# Patient Record
Sex: Female | Born: 1935 | Race: Black or African American | Hispanic: No | Smoking: Former smoker
Health system: Southern US, Community
[De-identification: ages and names within clinical notes are randomized; demographics above are authoritative.]

## PROBLEM LIST (undated history)

## (undated) DIAGNOSIS — N390 Urinary tract infection, site not specified: Secondary | ICD-10-CM

## (undated) DIAGNOSIS — I82403 Acute embolism and thrombosis of unspecified deep veins of lower extremity, bilateral: Secondary | ICD-10-CM

## (undated) DIAGNOSIS — F32A Depression, unspecified: Secondary | ICD-10-CM

## (undated) DIAGNOSIS — E785 Hyperlipidemia, unspecified: Secondary | ICD-10-CM

## (undated) DIAGNOSIS — Z8489 Family history of other specified conditions: Secondary | ICD-10-CM

## (undated) DIAGNOSIS — K219 Gastro-esophageal reflux disease without esophagitis: Secondary | ICD-10-CM

## (undated) DIAGNOSIS — Z8619 Personal history of other infectious and parasitic diseases: Secondary | ICD-10-CM

## (undated) DIAGNOSIS — Z5189 Encounter for other specified aftercare: Secondary | ICD-10-CM

## (undated) DIAGNOSIS — IMO0001 Reserved for inherently not codable concepts without codable children: Secondary | ICD-10-CM

## (undated) DIAGNOSIS — R011 Cardiac murmur, unspecified: Secondary | ICD-10-CM

## (undated) DIAGNOSIS — F329 Major depressive disorder, single episode, unspecified: Secondary | ICD-10-CM

## (undated) DIAGNOSIS — I429 Cardiomyopathy, unspecified: Secondary | ICD-10-CM

## (undated) DIAGNOSIS — M1990 Unspecified osteoarthritis, unspecified site: Secondary | ICD-10-CM

## (undated) DIAGNOSIS — F419 Anxiety disorder, unspecified: Secondary | ICD-10-CM

## (undated) DIAGNOSIS — I10 Essential (primary) hypertension: Secondary | ICD-10-CM

## (undated) DIAGNOSIS — D649 Anemia, unspecified: Secondary | ICD-10-CM

## (undated) DIAGNOSIS — Z923 Personal history of irradiation: Secondary | ICD-10-CM

## (undated) DIAGNOSIS — I809 Phlebitis and thrombophlebitis of unspecified site: Secondary | ICD-10-CM

## (undated) HISTORY — DX: Hyperlipidemia, unspecified: E78.5

## (undated) HISTORY — DX: Personal history of irradiation: Z92.3

## (undated) HISTORY — DX: Reserved for inherently not codable concepts without codable children: IMO0001

## (undated) HISTORY — DX: Unspecified osteoarthritis, unspecified site: M19.90

## (undated) HISTORY — DX: Essential (primary) hypertension: I10

## (undated) HISTORY — PX: EYE SURGERY: SHX253

## (undated) HISTORY — PX: COLONOSCOPY: SHX174

## (undated) HISTORY — PX: ABDOMINAL HYSTERECTOMY: SHX81

## (undated) HISTORY — DX: Encounter for other specified aftercare: Z51.89

## (undated) HISTORY — DX: Personal history of other infectious and parasitic diseases: Z86.19

## (undated) HISTORY — DX: Phlebitis and thrombophlebitis of unspecified site: I80.9

## (undated) HISTORY — DX: Depression, unspecified: F32.A

## (undated) HISTORY — DX: Major depressive disorder, single episode, unspecified: F32.9

## (undated) HISTORY — PX: ANKLE FRACTURE SURGERY: SHX122

---

## 1998-10-06 ENCOUNTER — Encounter: Payer: Self-pay | Admitting: *Deleted

## 1998-10-06 ENCOUNTER — Observation Stay (HOSPITAL_COMMUNITY): Admission: EM | Admit: 1998-10-06 | Discharge: 1998-10-07 | Payer: Self-pay | Admitting: Emergency Medicine

## 1998-10-06 ENCOUNTER — Encounter: Payer: Self-pay | Admitting: Emergency Medicine

## 2000-05-25 ENCOUNTER — Encounter: Payer: Self-pay | Admitting: Family Medicine

## 2000-05-25 ENCOUNTER — Encounter: Admission: RE | Admit: 2000-05-25 | Discharge: 2000-05-25 | Payer: Self-pay | Admitting: Family Medicine

## 2001-05-26 ENCOUNTER — Encounter: Admission: RE | Admit: 2001-05-26 | Discharge: 2001-05-26 | Payer: Self-pay | Admitting: Family Medicine

## 2001-05-26 ENCOUNTER — Encounter: Payer: Self-pay | Admitting: Family Medicine

## 2003-10-19 ENCOUNTER — Other Ambulatory Visit: Admission: RE | Admit: 2003-10-19 | Discharge: 2003-10-19 | Payer: Self-pay | Admitting: Family Medicine

## 2003-11-08 ENCOUNTER — Encounter: Admission: RE | Admit: 2003-11-08 | Discharge: 2003-11-08 | Payer: Self-pay | Admitting: Family Medicine

## 2003-12-04 ENCOUNTER — Ambulatory Visit (HOSPITAL_COMMUNITY): Admission: RE | Admit: 2003-12-04 | Discharge: 2003-12-04 | Payer: Self-pay | Admitting: Gastroenterology

## 2004-11-08 ENCOUNTER — Encounter: Admission: RE | Admit: 2004-11-08 | Discharge: 2004-11-08 | Payer: Self-pay | Admitting: Family Medicine

## 2004-12-03 ENCOUNTER — Other Ambulatory Visit: Admission: RE | Admit: 2004-12-03 | Discharge: 2004-12-03 | Payer: Self-pay | Admitting: Family Medicine

## 2005-11-11 ENCOUNTER — Encounter: Admission: RE | Admit: 2005-11-11 | Discharge: 2005-11-11 | Payer: Self-pay | Admitting: Family Medicine

## 2005-12-30 ENCOUNTER — Other Ambulatory Visit: Admission: RE | Admit: 2005-12-30 | Discharge: 2005-12-30 | Payer: Self-pay | Admitting: Family Medicine

## 2006-11-17 ENCOUNTER — Encounter: Admission: RE | Admit: 2006-11-17 | Discharge: 2006-11-17 | Payer: Self-pay | Admitting: Family Medicine

## 2007-01-19 ENCOUNTER — Other Ambulatory Visit: Admission: RE | Admit: 2007-01-19 | Discharge: 2007-01-19 | Payer: Self-pay | Admitting: Family Medicine

## 2007-02-03 ENCOUNTER — Encounter: Admission: RE | Admit: 2007-02-03 | Discharge: 2007-02-03 | Payer: Self-pay | Admitting: Family Medicine

## 2007-06-02 ENCOUNTER — Ambulatory Visit (HOSPITAL_COMMUNITY): Admission: RE | Admit: 2007-06-02 | Discharge: 2007-06-02 | Payer: Self-pay | Admitting: Family Medicine

## 2007-11-18 ENCOUNTER — Encounter: Admission: RE | Admit: 2007-11-18 | Discharge: 2007-11-18 | Payer: Self-pay | Admitting: Family Medicine

## 2008-04-28 ENCOUNTER — Emergency Department (HOSPITAL_COMMUNITY): Admission: EM | Admit: 2008-04-28 | Discharge: 2008-04-29 | Payer: Self-pay | Admitting: Emergency Medicine

## 2008-04-28 ENCOUNTER — Encounter: Admission: RE | Admit: 2008-04-28 | Discharge: 2008-04-28 | Payer: Self-pay | Admitting: Nephrology

## 2008-05-04 ENCOUNTER — Ambulatory Visit: Payer: Self-pay | Admitting: Internal Medicine

## 2008-05-06 ENCOUNTER — Encounter: Admission: RE | Admit: 2008-05-06 | Discharge: 2008-05-06 | Payer: Self-pay | Admitting: Nephrology

## 2008-05-10 LAB — CBC WITH DIFFERENTIAL/PLATELET
BASO%: 0.4 % (ref 0.0–2.0)
EOS%: 4.4 % (ref 0.0–7.0)
HCT: 29.2 % — ABNORMAL LOW (ref 34.8–46.6)
LYMPH%: 19.3 % (ref 14.0–48.0)
MCH: 32.9 pg (ref 26.0–34.0)
MCHC: 35 g/dL (ref 32.0–36.0)
NEUT%: 67.9 % (ref 39.6–76.8)
lymph#: 1 10*3/uL (ref 0.9–3.3)

## 2008-05-11 LAB — COMPREHENSIVE METABOLIC PANEL

## 2008-05-12 ENCOUNTER — Encounter: Payer: Self-pay | Admitting: Family Medicine

## 2008-05-12 ENCOUNTER — Encounter: Payer: Self-pay | Admitting: Internal Medicine

## 2008-05-12 ENCOUNTER — Other Ambulatory Visit: Admission: RE | Admit: 2008-05-12 | Discharge: 2008-05-12 | Payer: Self-pay | Admitting: Internal Medicine

## 2008-05-15 ENCOUNTER — Ambulatory Visit (HOSPITAL_COMMUNITY): Admission: RE | Admit: 2008-05-15 | Discharge: 2008-05-15 | Payer: Self-pay | Admitting: Internal Medicine

## 2008-05-15 LAB — IMMUNOFIXATION ELECTROPHORESIS
IgG (Immunoglobin G), Serum: 526 mg/dL — ABNORMAL LOW (ref 694–1618)
Total Protein, Serum Electrophoresis: 7.6 g/dL (ref 6.0–8.3)

## 2008-05-15 LAB — IRON AND TIBC
%SAT: 13 % — ABNORMAL LOW (ref 20–55)
TIBC: 312 ug/dL (ref 250–470)

## 2008-05-15 LAB — KAPPA/LAMBDA LIGHT CHAINS: Kappa:Lambda Ratio: 0 — ABNORMAL LOW (ref 0.26–1.65)

## 2008-05-18 LAB — BASIC METABOLIC PANEL
BUN: 53 mg/dL — ABNORMAL HIGH (ref 6–23)
Chloride: 103 mEq/L (ref 96–112)
Potassium: 4 mEq/L (ref 3.5–5.3)
Sodium: 139 mEq/L (ref 135–145)

## 2008-05-18 LAB — CBC WITH DIFFERENTIAL/PLATELET
BASO%: 0.4 % (ref 0.0–2.0)
Basophils Absolute: 0 10*3/uL (ref 0.0–0.1)
HCT: 29.4 % — ABNORMAL LOW (ref 34.8–46.6)
HGB: 10.1 g/dL — ABNORMAL LOW (ref 11.6–15.9)
MCHC: 34.5 g/dL (ref 32.0–36.0)
MONO#: 0.5 10*3/uL (ref 0.1–0.9)
NEUT%: 59.2 % (ref 39.6–76.8)
RDW: 14.4 % (ref 11.3–14.5)
WBC: 5.6 10*3/uL (ref 3.9–10.0)
lymph#: 1.4 10*3/uL (ref 0.9–3.3)

## 2008-05-19 DIAGNOSIS — M1990 Unspecified osteoarthritis, unspecified site: Secondary | ICD-10-CM | POA: Insufficient documentation

## 2008-05-19 DIAGNOSIS — E785 Hyperlipidemia, unspecified: Secondary | ICD-10-CM

## 2008-05-19 DIAGNOSIS — I10 Essential (primary) hypertension: Secondary | ICD-10-CM

## 2008-05-23 ENCOUNTER — Ambulatory Visit: Admission: RE | Admit: 2008-05-23 | Discharge: 2008-06-27 | Payer: Self-pay | Admitting: Radiation Oncology

## 2008-05-24 ENCOUNTER — Encounter: Payer: Self-pay | Admitting: Family Medicine

## 2008-05-31 ENCOUNTER — Encounter (HOSPITAL_COMMUNITY): Admission: RE | Admit: 2008-05-31 | Discharge: 2008-08-29 | Payer: Self-pay | Admitting: Internal Medicine

## 2008-05-31 LAB — CBC WITH DIFFERENTIAL/PLATELET
BASO%: 0 % (ref 0.0–2.0)
Eosinophils Absolute: 0.3 10*3/uL (ref 0.0–0.5)
LYMPH%: 5.3 % — ABNORMAL LOW (ref 14.0–48.0)
MCHC: 34.8 g/dL (ref 32.0–36.0)
MCV: 93.5 fL (ref 81.0–101.0)
MONO%: 3.7 % (ref 0.0–13.0)
NEUT#: 6 10*3/uL (ref 1.5–6.5)
Platelets: 257 10*3/uL (ref 145–400)
RBC: 2.66 10*6/uL — ABNORMAL LOW (ref 3.70–5.32)
RDW: 15.3 % — ABNORMAL HIGH (ref 11.3–14.5)
WBC: 7 10*3/uL (ref 3.9–10.0)

## 2008-05-31 LAB — COMPREHENSIVE METABOLIC PANEL
ALT: 18 U/L (ref 0–35)
AST: 32 U/L (ref 0–37)
Albumin: 3.9 g/dL (ref 3.5–5.2)
Alkaline Phosphatase: 75 U/L (ref 39–117)
Potassium: 4.9 mEq/L (ref 3.5–5.3)
Sodium: 129 mEq/L — ABNORMAL LOW (ref 135–145)
Total Bilirubin: 0.4 mg/dL (ref 0.3–1.2)
Total Protein: 6.4 g/dL (ref 6.0–8.3)

## 2008-05-31 LAB — TYPE & CROSSMATCH - CHCC

## 2008-06-06 ENCOUNTER — Ambulatory Visit (HOSPITAL_COMMUNITY): Admission: RE | Admit: 2008-06-06 | Discharge: 2008-06-06 | Payer: Self-pay | Admitting: Internal Medicine

## 2008-06-09 ENCOUNTER — Ambulatory Visit: Payer: Self-pay | Admitting: Oncology

## 2008-06-09 ENCOUNTER — Inpatient Hospital Stay (HOSPITAL_COMMUNITY): Admission: EM | Admit: 2008-06-09 | Discharge: 2008-06-14 | Payer: Self-pay | Admitting: Emergency Medicine

## 2008-06-26 ENCOUNTER — Ambulatory Visit: Payer: Self-pay | Admitting: Internal Medicine

## 2008-06-28 LAB — MANUAL DIFFERENTIAL
ALC: 0.4 10*3/uL — ABNORMAL LOW (ref 0.9–3.3)
Band Neutrophils: 0 % (ref 0–10)
Blasts: 0 % (ref 0–0)
EOS: 23 % — ABNORMAL HIGH (ref 0–7)
Other Cell: 0 % (ref 0–0)
PLT EST: ADEQUATE
PROMYELO: 0 % (ref 0–0)
SEG: 67 % (ref 38–77)
Variant Lymph: 0 % (ref 0–0)
nRBC: 0 % (ref 0–0)

## 2008-06-28 LAB — LACTATE DEHYDROGENASE: LDH: 177 U/L (ref 94–250)

## 2008-06-28 LAB — COMPREHENSIVE METABOLIC PANEL
BUN: 10 mg/dL (ref 6–23)
CO2: 19 mEq/L (ref 19–32)
Calcium: 8.7 mg/dL (ref 8.4–10.5)
Creatinine, Ser: 0.68 mg/dL (ref 0.40–1.20)
Glucose, Bld: 136 mg/dL — ABNORMAL HIGH (ref 70–99)
Total Bilirubin: 0.4 mg/dL (ref 0.3–1.2)

## 2008-06-28 LAB — CBC WITH DIFFERENTIAL/PLATELET
HGB: 8.4 g/dL — ABNORMAL LOW (ref 11.6–15.9)
MCV: 93.3 fL (ref 81.0–101.0)
RDW: 14.1 % (ref 11.3–14.5)
WBC: 5.5 10*3/uL (ref 3.9–10.0)

## 2008-06-30 LAB — TYPE & CROSSMATCH - CHCC

## 2008-07-13 ENCOUNTER — Encounter: Payer: Self-pay | Admitting: Internal Medicine

## 2008-07-13 ENCOUNTER — Ambulatory Visit: Admission: RE | Admit: 2008-07-13 | Discharge: 2008-07-13 | Payer: Self-pay | Admitting: Internal Medicine

## 2008-07-13 ENCOUNTER — Ambulatory Visit: Payer: Self-pay | Admitting: Vascular Surgery

## 2008-07-13 LAB — COMPREHENSIVE METABOLIC PANEL
ALT: <8 U/L (ref 0–35)
Albumin: 3.7 g/dL (ref 3.5–5.2)
BUN: 15 mg/dL (ref 6–23)
Total Bilirubin: 0.6 mg/dL (ref 0.3–1.2)
Total Protein: 6.3 g/dL (ref 6.0–8.3)

## 2008-07-13 LAB — CBC WITH DIFFERENTIAL/PLATELET
Eosinophils Absolute: 0.5 10*3/uL (ref 0.0–0.5)
MONO#: 0.8 10*3/uL (ref 0.1–0.9)
NEUT#: 5.1 10*3/uL (ref 1.5–6.5)
Platelets: 257 10*3/uL (ref 145–400)
RBC: 3.39 10*6/uL — ABNORMAL LOW (ref 3.70–5.32)
RDW: 13.4 % (ref 11.3–14.5)
WBC: 6.8 10*3/uL (ref 3.9–10.0)
lymph#: 0.4 10*3/uL — ABNORMAL LOW (ref 0.9–3.3)

## 2008-07-13 LAB — LACTATE DEHYDROGENASE: LDH: 277 U/L — ABNORMAL HIGH (ref 94–250)

## 2008-07-17 LAB — CBC WITH DIFFERENTIAL/PLATELET
Eosinophils Absolute: 0 10*3/uL (ref 0.0–0.5)
HCT: 28.3 % — ABNORMAL LOW (ref 34.8–46.6)
HGB: 9.7 g/dL — ABNORMAL LOW (ref 11.6–15.9)
LYMPH%: 8.9 % — ABNORMAL LOW (ref 14.0–48.0)
MONO#: 1 10*3/uL — ABNORMAL HIGH (ref 0.1–0.9)
NEUT#: 4 10*3/uL (ref 1.5–6.5)
NEUT%: 71.7 % (ref 39.6–76.8)
Platelets: 564 10*3/uL — ABNORMAL HIGH (ref 145–400)
WBC: 5.6 10*3/uL (ref 3.9–10.0)

## 2008-07-17 LAB — PROTIME-INR: INR: 2.2 (ref 2.00–3.50)

## 2008-07-19 LAB — COMPREHENSIVE METABOLIC PANEL
CO2: 21 mEq/L (ref 19–32)
Calcium: 8.8 mg/dL (ref 8.4–10.5)
Chloride: 107 mEq/L (ref 96–112)
Creatinine, Ser: 0.8 mg/dL (ref 0.40–1.20)
Glucose, Bld: 80 mg/dL (ref 70–99)
Total Bilirubin: 0.3 mg/dL (ref 0.3–1.2)
Total Protein: 6.7 g/dL (ref 6.0–8.3)

## 2008-07-19 LAB — KAPPA/LAMBDA LIGHT CHAINS: Kappa free light chain: 1.41 mg/dL (ref 0.33–1.94)

## 2008-07-19 LAB — IGG, IGA, IGM
IgA: 172 mg/dL (ref 68–378)
IgG (Immunoglobin G), Serum: 667 mg/dL — ABNORMAL LOW (ref 694–1618)

## 2008-07-19 LAB — PROTIME-INR
INR: 3.1 (ref 2.00–3.50)
Protime: 37.2 Seconds — ABNORMAL HIGH (ref 10.6–13.4)

## 2008-07-19 LAB — BETA 2 MICROGLOBULIN, SERUM: Beta-2 Microglobulin: 1.67 mg/L (ref 1.01–1.73)

## 2008-07-20 LAB — KAPPA/LAMBDA LIGHT CHAINS
Kappa:Lambda Ratio: 0.33 (ref 0.26–1.65)
Lambda Free Lght Chn: 4.89 mg/dL — ABNORMAL HIGH (ref 0.57–2.63)

## 2008-07-31 LAB — PROTIME-INR: Protime: 21.6 Seconds — ABNORMAL HIGH (ref 10.6–13.4)

## 2008-08-07 LAB — CBC WITH DIFFERENTIAL/PLATELET
Eosinophils Absolute: 1.8 10*3/uL — ABNORMAL HIGH (ref 0.0–0.5)
HGB: 9.7 g/dL — ABNORMAL LOW (ref 11.6–15.9)
MCV: 90.1 fL (ref 81.0–101.0)
MONO#: 0.9 10*3/uL (ref 0.1–0.9)
MONO%: 13.2 % — ABNORMAL HIGH (ref 0.0–13.0)
NEUT#: 3.3 10*3/uL (ref 1.5–6.5)
RBC: 3.09 10*6/uL — ABNORMAL LOW (ref 3.70–5.32)
RDW: 14.4 % (ref 11.3–14.5)
WBC: 7.2 10*3/uL (ref 3.9–10.0)
lymph#: 0.7 10*3/uL — ABNORMAL LOW (ref 0.9–3.3)

## 2008-08-07 LAB — PROTIME-INR
INR: 2.2 (ref 2.00–3.50)
Protime: 26.4 Seconds — ABNORMAL HIGH (ref 10.6–13.4)

## 2008-08-08 ENCOUNTER — Other Ambulatory Visit: Admission: RE | Admit: 2008-08-08 | Discharge: 2008-08-08 | Payer: Self-pay | Admitting: Family Medicine

## 2008-08-09 LAB — COMPREHENSIVE METABOLIC PANEL
AST: 9 U/L (ref 0–37)
Albumin: 4.1 g/dL (ref 3.5–5.2)
Alkaline Phosphatase: 94 U/L (ref 39–117)
Potassium: 4.2 mEq/L (ref 3.5–5.3)
Sodium: 139 mEq/L (ref 135–145)
Total Protein: 6.2 g/dL (ref 6.0–8.3)

## 2008-08-09 LAB — CBC WITH DIFFERENTIAL/PLATELET
Basophils Absolute: 0 10*3/uL (ref 0.0–0.1)
Eosinophils Absolute: 0.7 10*3/uL — ABNORMAL HIGH (ref 0.0–0.5)
HGB: 9.2 g/dL — ABNORMAL LOW (ref 11.6–15.9)
LYMPH%: 6.9 % — ABNORMAL LOW (ref 14.0–48.0)
MONO#: 0.9 10*3/uL (ref 0.1–0.9)
NEUT#: 4.7 10*3/uL (ref 1.5–6.5)
Platelets: 232 10*3/uL (ref 145–400)
RBC: 2.9 10*6/uL — ABNORMAL LOW (ref 3.70–5.32)
RDW: 15.3 % — ABNORMAL HIGH (ref 11.3–14.5)
WBC: 6.8 10*3/uL (ref 3.9–10.0)

## 2008-08-09 LAB — PROTIME-INR
INR: 2.1 (ref 2.00–3.50)
Protime: 25.2 Seconds — ABNORMAL HIGH (ref 10.6–13.4)

## 2008-08-21 ENCOUNTER — Ambulatory Visit: Payer: Self-pay | Admitting: Internal Medicine

## 2008-08-23 LAB — CBC WITH DIFFERENTIAL/PLATELET
Eosinophils Absolute: 0 10*3/uL (ref 0.0–0.5)
LYMPH%: 7.7 % — ABNORMAL LOW (ref 14.0–48.0)
MONO#: 0.3 10*3/uL (ref 0.1–0.9)
NEUT#: 4.9 10*3/uL (ref 1.5–6.5)
Platelets: 191 10*3/uL (ref 145–400)
RBC: 3 10*6/uL — ABNORMAL LOW (ref 3.70–5.32)
WBC: 5.6 10*3/uL (ref 3.9–10.0)
lymph#: 0.4 10*3/uL — ABNORMAL LOW (ref 0.9–3.3)

## 2008-08-23 LAB — PROTIME-INR
INR: 1.5 — ABNORMAL LOW (ref 2.00–3.50)
Protime: 18 Seconds — ABNORMAL HIGH (ref 10.6–13.4)

## 2008-08-23 LAB — COMPREHENSIVE METABOLIC PANEL
ALT: 12 U/L (ref 0–35)
AST: 10 U/L (ref 0–37)
Albumin: 3.8 g/dL (ref 3.5–5.2)
Alkaline Phosphatase: 83 U/L (ref 39–117)
Potassium: 3.8 mEq/L (ref 3.5–5.3)
Sodium: 142 mEq/L (ref 135–145)
Total Bilirubin: 0.3 mg/dL (ref 0.3–1.2)
Total Protein: 5.9 g/dL — ABNORMAL LOW (ref 6.0–8.3)

## 2008-08-30 LAB — CBC WITH DIFFERENTIAL/PLATELET
BASO%: 0.2 % (ref 0.0–2.0)
EOS%: 10.4 % — ABNORMAL HIGH (ref 0.0–7.0)
LYMPH%: 8.5 % — ABNORMAL LOW (ref 14.0–48.0)
MCH: 31.6 pg (ref 26.0–34.0)
MCV: 93.7 fL (ref 81.0–101.0)
MONO%: 11.7 % (ref 0.0–13.0)
Platelets: 173 10*3/uL (ref 145–400)
RBC: 3.11 10*6/uL — ABNORMAL LOW (ref 3.70–5.32)
RDW: 16.6 % — ABNORMAL HIGH (ref 11.3–14.5)

## 2008-08-30 LAB — PROTIME-INR

## 2008-08-31 LAB — BETA 2 MICROGLOBULIN, SERUM: Beta-2 Microglobulin: 1.87 mg/L — ABNORMAL HIGH (ref 1.01–1.73)

## 2008-08-31 LAB — KAPPA/LAMBDA LIGHT CHAINS
Kappa free light chain: 1.22 mg/dL (ref 0.33–1.94)
Lambda Free Lght Chn: 3.63 mg/dL — ABNORMAL HIGH (ref 0.57–2.63)

## 2008-08-31 LAB — IGG, IGA, IGM
IgA: 69 mg/dL (ref 68–378)
IgM, Serum: 63 mg/dL (ref 60–263)

## 2008-08-31 LAB — COMPREHENSIVE METABOLIC PANEL
Alkaline Phosphatase: 78 U/L (ref 39–117)
BUN: 16 mg/dL (ref 6–23)
Glucose, Bld: 106 mg/dL — ABNORMAL HIGH (ref 70–99)
Total Bilirubin: 0.5 mg/dL (ref 0.3–1.2)

## 2008-09-06 LAB — CBC WITH DIFFERENTIAL/PLATELET
BASO%: 0.2 % (ref 0.0–2.0)
Basophils Absolute: 0 10*3/uL (ref 0.0–0.1)
EOS%: 8.8 % — ABNORMAL HIGH (ref 0.0–7.0)
HCT: 29 % — ABNORMAL LOW (ref 34.8–46.6)
HGB: 9.8 g/dL — ABNORMAL LOW (ref 11.6–15.9)
LYMPH%: 9.7 % — ABNORMAL LOW (ref 14.0–48.0)
MCH: 31.7 pg (ref 26.0–34.0)
MCHC: 33.6 g/dL (ref 32.0–36.0)
NEUT%: 72 % (ref 39.6–76.8)
Platelets: ADEQUATE 10*3/uL (ref 145–400)

## 2008-09-13 LAB — COMPREHENSIVE METABOLIC PANEL
Albumin: 4 g/dL (ref 3.5–5.2)
CO2: 24 mEq/L (ref 19–32)
Calcium: 9 mg/dL (ref 8.4–10.5)
Glucose, Bld: 122 mg/dL — ABNORMAL HIGH (ref 70–99)
Sodium: 140 mEq/L (ref 135–145)
Total Bilirubin: 0.3 mg/dL (ref 0.3–1.2)
Total Protein: 5.9 g/dL — ABNORMAL LOW (ref 6.0–8.3)

## 2008-09-13 LAB — CBC WITH DIFFERENTIAL/PLATELET
BASO%: 0.7 % (ref 0.0–2.0)
Basophils Absolute: 0 10*3/uL (ref 0.0–0.1)
HCT: 30.2 % — ABNORMAL LOW (ref 34.8–46.6)
HGB: 10.3 g/dL — ABNORMAL LOW (ref 11.6–15.9)
LYMPH%: 23.3 % (ref 14.0–48.0)
MCH: 32.5 pg (ref 26.0–34.0)
MCHC: 34 g/dL (ref 32.0–36.0)
MONO#: 0.4 10*3/uL (ref 0.1–0.9)
NEUT%: 62.3 % (ref 39.6–76.8)
Platelets: 345 10*3/uL (ref 145–400)
WBC: 2.9 10*3/uL — ABNORMAL LOW (ref 3.9–10.0)
lymph#: 0.7 10*3/uL — ABNORMAL LOW (ref 0.9–3.3)

## 2008-09-27 LAB — CBC WITH DIFFERENTIAL/PLATELET
Eosinophils Absolute: 0.3 10*3/uL (ref 0.0–0.5)
MONO#: 0.5 10*3/uL (ref 0.1–0.9)
NEUT#: 3.4 10*3/uL (ref 1.5–6.5)
Platelets: 232 10*3/uL (ref 145–400)
RBC: 3.36 10*6/uL — ABNORMAL LOW (ref 3.70–5.32)
RDW: 16.3 % — ABNORMAL HIGH (ref 11.3–14.5)
WBC: 4.7 10*3/uL (ref 3.9–10.0)
lymph#: 0.5 10*3/uL — ABNORMAL LOW (ref 0.9–3.3)

## 2008-09-27 LAB — COMPREHENSIVE METABOLIC PANEL
AST: 11 U/L (ref 0–37)
Albumin: 3.9 g/dL (ref 3.5–5.2)
Alkaline Phosphatase: 69 U/L (ref 39–117)
Potassium: 3.5 mEq/L (ref 3.5–5.3)
Sodium: 141 mEq/L (ref 135–145)
Total Bilirubin: 0.4 mg/dL (ref 0.3–1.2)
Total Protein: 5.8 g/dL — ABNORMAL LOW (ref 6.0–8.3)

## 2008-09-27 LAB — PROTIME-INR
INR: 1.8 — ABNORMAL LOW (ref 2.00–3.50)
Protime: 21.6 Seconds — ABNORMAL HIGH (ref 10.6–13.4)

## 2008-10-02 ENCOUNTER — Ambulatory Visit: Payer: Self-pay | Admitting: Internal Medicine

## 2008-10-04 LAB — CBC WITH DIFFERENTIAL/PLATELET
BASO%: 0.3 % (ref 0.0–2.0)
EOS%: 11 % — ABNORMAL HIGH (ref 0.0–7.0)
HCT: 32.8 % — ABNORMAL LOW (ref 34.8–46.6)
LYMPH%: 10.3 % — ABNORMAL LOW (ref 14.0–48.0)
MCH: 31.3 pg (ref 26.0–34.0)
MCHC: 33 g/dL (ref 32.0–36.0)
MCV: 94.8 fL (ref 81.0–101.0)
MONO#: 0.6 10*3/uL (ref 0.1–0.9)
MONO%: 12.6 % (ref 0.0–13.0)
NEUT%: 65.8 % (ref 39.6–76.8)
Platelets: 198 10*3/uL (ref 145–400)
RBC: 3.45 10*6/uL — ABNORMAL LOW (ref 3.70–5.32)

## 2008-10-04 LAB — COMPREHENSIVE METABOLIC PANEL
CO2: 26 mEq/L (ref 19–32)
Creatinine, Ser: 0.75 mg/dL (ref 0.40–1.20)
Glucose, Bld: 105 mg/dL — ABNORMAL HIGH (ref 70–99)
Sodium: 143 mEq/L (ref 135–145)
Total Bilirubin: 0.3 mg/dL (ref 0.3–1.2)
Total Protein: 6.3 g/dL (ref 6.0–8.3)

## 2008-10-18 LAB — CBC WITH DIFFERENTIAL/PLATELET
BASO%: 0.8 % (ref 0.0–2.0)
HCT: 35.1 % (ref 34.8–46.6)
LYMPH%: 15.8 % (ref 14.0–48.0)
MCHC: 34.2 g/dL (ref 32.0–36.0)
MCV: 93.8 fL (ref 81.0–101.0)
MONO#: 0.4 10*3/uL (ref 0.1–0.9)
MONO%: 10.1 % (ref 0.0–13.0)
NEUT%: 67.9 % (ref 39.6–76.8)
Platelets: 232 10*3/uL (ref 145–400)
WBC: 3.5 10*3/uL — ABNORMAL LOW (ref 3.9–10.0)

## 2008-10-18 LAB — COMPREHENSIVE METABOLIC PANEL
ALT: 10 U/L (ref 0–35)
AST: 11 U/L (ref 0–37)
Alkaline Phosphatase: 76 U/L (ref 39–117)
Sodium: 140 mEq/L (ref 135–145)
Total Bilirubin: 0.3 mg/dL (ref 0.3–1.2)
Total Protein: 6.2 g/dL (ref 6.0–8.3)

## 2008-10-18 LAB — TECHNOLOGIST REVIEW

## 2008-11-01 LAB — CBC WITH DIFFERENTIAL/PLATELET
BASO%: 0.8 % (ref 0.0–2.0)
EOS%: 15.2 % — ABNORMAL HIGH (ref 0.0–7.0)
HCT: 32.7 % — ABNORMAL LOW (ref 34.8–46.6)
MCH: 30.2 pg (ref 25.1–34.0)
MCHC: 33.9 g/dL (ref 31.5–36.0)
NEUT%: 60.2 % (ref 38.4–76.8)
RBC: 3.68 10*6/uL — ABNORMAL LOW (ref 3.70–5.45)
lymph#: 0.5 10*3/uL — ABNORMAL LOW (ref 0.9–3.3)
nRBC: 0 % (ref 0–0)

## 2008-11-01 LAB — PROTIME-INR: INR: 3.3 (ref 2.00–3.50)

## 2008-11-01 LAB — COMPREHENSIVE METABOLIC PANEL
AST: 11 U/L (ref 0–37)
BUN: 16 mg/dL (ref 6–23)
CO2: 31 mEq/L (ref 19–32)
Calcium: 9.5 mg/dL (ref 8.4–10.5)
Chloride: 101 mEq/L (ref 96–112)
Creatinine, Ser: 0.86 mg/dL (ref 0.40–1.20)

## 2008-11-20 ENCOUNTER — Ambulatory Visit: Payer: Self-pay | Admitting: Internal Medicine

## 2008-11-22 LAB — CBC WITH DIFFERENTIAL/PLATELET
Basophils Absolute: 0 10*3/uL (ref 0.0–0.1)
HCT: 35.1 % (ref 34.8–46.6)
HGB: 11.9 g/dL (ref 11.6–15.9)
MONO#: 0.4 10*3/uL (ref 0.1–0.9)
NEUT%: 73.3 % (ref 38.4–76.8)
WBC: 4.4 10*3/uL (ref 3.9–10.3)
lymph#: 0.4 10*3/uL — ABNORMAL LOW (ref 0.9–3.3)

## 2008-11-22 LAB — COMPREHENSIVE METABOLIC PANEL
BUN: 10 mg/dL (ref 6–23)
CO2: 30 mEq/L (ref 19–32)
Calcium: 9.1 mg/dL (ref 8.4–10.5)
Chloride: 100 mEq/L (ref 96–112)
Creatinine, Ser: 0.98 mg/dL (ref 0.40–1.20)

## 2008-11-22 LAB — PROTHROMBIN TIME: INR: 1.4 (ref 0.0–1.5)

## 2008-11-22 LAB — LACTATE DEHYDROGENASE: LDH: 137 U/L (ref 94–250)

## 2008-11-29 LAB — CBC WITH DIFFERENTIAL/PLATELET
BASO%: 0.5 % (ref 0.0–2.0)
EOS%: 0 % (ref 0.0–7.0)
LYMPH%: 13.1 % — ABNORMAL LOW (ref 14.0–49.7)
MCHC: 34 g/dL (ref 31.5–36.0)
MONO#: 0.5 10*3/uL (ref 0.1–0.9)
Platelets: 194 10*3/uL (ref 145–400)
RBC: 3.72 10*6/uL (ref 3.70–5.45)
WBC: 3.4 10*3/uL — ABNORMAL LOW (ref 3.9–10.3)
lymph#: 0.4 10*3/uL — ABNORMAL LOW (ref 0.9–3.3)

## 2008-11-30 LAB — COMPREHENSIVE METABOLIC PANEL
CO2: 27 mEq/L (ref 19–32)
Creatinine, Ser: 1.04 mg/dL (ref 0.40–1.20)
Glucose, Bld: 130 mg/dL — ABNORMAL HIGH (ref 70–99)
Total Bilirubin: 0.3 mg/dL (ref 0.3–1.2)

## 2008-11-30 LAB — IGG, IGA, IGM
IgA: 82 mg/dL (ref 68–378)
IgG (Immunoglobin G), Serum: 512 mg/dL — ABNORMAL LOW (ref 694–1618)
IgM, Serum: 47 mg/dL — ABNORMAL LOW (ref 60–263)

## 2008-11-30 LAB — BETA 2 MICROGLOBULIN, SERUM: Beta-2 Microglobulin: 2.26 mg/L — ABNORMAL HIGH (ref 1.01–1.73)

## 2008-11-30 LAB — LACTATE DEHYDROGENASE: LDH: 159 U/L (ref 94–250)

## 2008-12-06 LAB — PROTIME-INR: Protime: 33.6 Seconds — ABNORMAL HIGH (ref 10.6–13.4)

## 2008-12-08 ENCOUNTER — Encounter: Admission: RE | Admit: 2008-12-08 | Discharge: 2008-12-08 | Payer: Self-pay | Admitting: Family Medicine

## 2008-12-15 ENCOUNTER — Encounter: Admission: RE | Admit: 2008-12-15 | Discharge: 2008-12-15 | Payer: Self-pay | Admitting: Family Medicine

## 2008-12-20 LAB — PROTIME-INR

## 2008-12-27 LAB — COMPREHENSIVE METABOLIC PANEL
BUN: 11 mg/dL (ref 6–23)
CO2: 28 mEq/L (ref 19–32)
Calcium: 9.4 mg/dL (ref 8.4–10.5)
Chloride: 97 mEq/L (ref 96–112)
Creatinine, Ser: 0.97 mg/dL (ref 0.40–1.20)
Glucose, Bld: 178 mg/dL — ABNORMAL HIGH (ref 70–99)

## 2008-12-27 LAB — CBC WITH DIFFERENTIAL/PLATELET
Basophils Absolute: 0 10*3/uL (ref 0.0–0.1)
EOS%: 7.6 % — ABNORMAL HIGH (ref 0.0–7.0)
HCT: 33.2 % — ABNORMAL LOW (ref 34.8–46.6)
HGB: 11.3 g/dL — ABNORMAL LOW (ref 11.6–15.9)
LYMPH%: 7.3 % — ABNORMAL LOW (ref 14.0–49.7)
MCH: 31.4 pg (ref 25.1–34.0)
MCV: 92.2 fL (ref 79.5–101.0)
MONO%: 9.7 % (ref 0.0–14.0)
NEUT%: 75.4 % (ref 38.4–76.8)
Platelets: 231 10*3/uL (ref 145–400)

## 2008-12-27 LAB — PROTIME-INR
INR: 2.1 (ref 2.00–3.50)
Protime: 25.2 Seconds — ABNORMAL HIGH (ref 10.6–13.4)

## 2008-12-27 LAB — LACTATE DEHYDROGENASE: LDH: 187 U/L (ref 94–250)

## 2009-01-01 ENCOUNTER — Ambulatory Visit: Payer: Self-pay | Admitting: Internal Medicine

## 2009-01-03 LAB — PROTIME-INR: INR: 3.2 (ref 2.00–3.50)

## 2009-01-10 LAB — PROTIME-INR
INR: 2.6 (ref 2.00–3.50)
Protime: 31.2 Seconds — ABNORMAL HIGH (ref 10.6–13.4)

## 2009-01-10 LAB — COMPREHENSIVE METABOLIC PANEL
ALT: 9 U/L (ref 0–35)
AST: 14 U/L (ref 0–37)
Alkaline Phosphatase: 58 U/L (ref 39–117)
Creatinine, Ser: 0.97 mg/dL (ref 0.40–1.20)
Total Bilirubin: 0.3 mg/dL (ref 0.3–1.2)

## 2009-01-10 LAB — CBC WITH DIFFERENTIAL/PLATELET
BASO%: 0.9 % (ref 0.0–2.0)
EOS%: 7.2 % — ABNORMAL HIGH (ref 0.0–7.0)
MCH: 31.3 pg (ref 25.1–34.0)
MCHC: 33.6 g/dL (ref 31.5–36.0)
RDW: 16.2 % — ABNORMAL HIGH (ref 11.2–14.5)
WBC: 2.5 10*3/uL — ABNORMAL LOW (ref 3.9–10.3)
lymph#: 0.4 10*3/uL — ABNORMAL LOW (ref 0.9–3.3)

## 2009-01-17 LAB — PROTIME-INR: Protime: 31.2 Seconds — ABNORMAL HIGH (ref 10.6–13.4)

## 2009-01-25 LAB — COMPREHENSIVE METABOLIC PANEL
BUN: 15 mg/dL (ref 6–23)
CO2: 25 mEq/L (ref 19–32)
Calcium: 9.1 mg/dL (ref 8.4–10.5)
Chloride: 103 mEq/L (ref 96–112)
Creatinine, Ser: 1.07 mg/dL (ref 0.40–1.20)
Total Bilirubin: 0.4 mg/dL (ref 0.3–1.2)

## 2009-01-25 LAB — CBC WITH DIFFERENTIAL/PLATELET
EOS%: 19.2 % — ABNORMAL HIGH (ref 0.0–7.0)
Eosinophils Absolute: 0.6 10*3/uL — ABNORMAL HIGH (ref 0.0–0.5)
MCV: 90.8 fL (ref 79.5–101.0)
MONO%: 15.9 % — ABNORMAL HIGH (ref 0.0–14.0)
NEUT#: 1.6 10*3/uL (ref 1.5–6.5)
RBC: 3.38 10*6/uL — ABNORMAL LOW (ref 3.70–5.45)
RDW: 14.8 % — ABNORMAL HIGH (ref 11.2–14.5)
nRBC: 0 % (ref 0–0)

## 2009-01-25 LAB — PROTIME-INR
INR: 2.8 (ref 2.00–3.50)
Protime: 33.6 Seconds — ABNORMAL HIGH (ref 10.6–13.4)

## 2009-02-20 ENCOUNTER — Ambulatory Visit: Payer: Self-pay | Admitting: Internal Medicine

## 2009-02-22 LAB — COMPREHENSIVE METABOLIC PANEL
Albumin: 4 g/dL (ref 3.5–5.2)
BUN: 10 mg/dL (ref 6–23)
CO2: 27 mEq/L (ref 19–32)
Calcium: 9.1 mg/dL (ref 8.4–10.5)
Chloride: 102 mEq/L (ref 96–112)
Creatinine, Ser: 0.96 mg/dL (ref 0.40–1.20)
Glucose, Bld: 135 mg/dL — ABNORMAL HIGH (ref 70–99)
Potassium: 3 mEq/L — ABNORMAL LOW (ref 3.5–5.3)

## 2009-02-22 LAB — CBC WITH DIFFERENTIAL/PLATELET
Basophils Absolute: 0 10*3/uL (ref 0.0–0.1)
EOS%: 13.5 % — ABNORMAL HIGH (ref 0.0–7.0)
Eosinophils Absolute: 0.4 10*3/uL (ref 0.0–0.5)
HCT: 30.3 % — ABNORMAL LOW (ref 34.8–46.6)
HGB: 10.1 g/dL — ABNORMAL LOW (ref 11.6–15.9)
MCH: 30.1 pg (ref 25.1–34.0)
MONO#: 0.5 10*3/uL (ref 0.1–0.9)
NEUT#: 1.7 10*3/uL (ref 1.5–6.5)
RDW: 14.4 % (ref 11.2–14.5)
WBC: 3.2 10*3/uL — ABNORMAL LOW (ref 3.9–10.3)
lymph#: 0.6 10*3/uL — ABNORMAL LOW (ref 0.9–3.3)

## 2009-02-22 LAB — PROTIME-INR
INR: 2.3 (ref 2.00–3.50)
Protime: 27.6 Seconds — ABNORMAL HIGH (ref 10.6–13.4)

## 2009-03-15 LAB — CBC WITH DIFFERENTIAL/PLATELET
BASO%: 0.2 % (ref 0.0–2.0)
LYMPH%: 14.8 % (ref 14.0–49.7)
MCHC: 34.4 g/dL (ref 31.5–36.0)
MONO#: 0.5 10*3/uL (ref 0.1–0.9)
RBC: 3.51 10*6/uL — ABNORMAL LOW (ref 3.70–5.45)
RDW: 15.8 % — ABNORMAL HIGH (ref 11.2–14.5)
WBC: 3.7 10*3/uL — ABNORMAL LOW (ref 3.9–10.3)
lymph#: 0.5 10*3/uL — ABNORMAL LOW (ref 0.9–3.3)

## 2009-03-15 LAB — PROTIME-INR
INR: 2 (ref 2.00–3.50)
Protime: 24 Seconds — ABNORMAL HIGH (ref 10.6–13.4)

## 2009-03-16 LAB — COMPREHENSIVE METABOLIC PANEL
ALT: 13 U/L (ref 0–35)
CO2: 30 mEq/L (ref 19–32)
Chloride: 99 mEq/L (ref 96–112)
Sodium: 141 mEq/L (ref 135–145)
Total Bilirubin: 0.4 mg/dL (ref 0.3–1.2)
Total Protein: 6 g/dL (ref 6.0–8.3)

## 2009-03-16 LAB — IGG, IGA, IGM: IgG (Immunoglobin G), Serum: 523 mg/dL — ABNORMAL LOW (ref 694–1618)

## 2009-03-16 LAB — KAPPA/LAMBDA LIGHT CHAINS: Kappa free light chain: 0.79 mg/dL (ref 0.33–1.94)

## 2009-03-20 ENCOUNTER — Ambulatory Visit: Payer: Self-pay | Admitting: Internal Medicine

## 2009-03-22 LAB — CBC WITH DIFFERENTIAL/PLATELET
BASO%: 0.6 % (ref 0.0–2.0)
EOS%: 15 % — ABNORMAL HIGH (ref 0.0–7.0)
MCH: 30.4 pg (ref 25.1–34.0)
MCHC: 33.7 g/dL (ref 31.5–36.0)
MCV: 90.1 fL (ref 79.5–101.0)
MONO%: 14 % (ref 0.0–14.0)
NEUT#: 1.8 10*3/uL (ref 1.5–6.5)
RBC: 3.65 10*6/uL — ABNORMAL LOW (ref 3.70–5.45)
RDW: 14.4 % (ref 11.2–14.5)
nRBC: 0 % (ref 0–0)

## 2009-03-23 LAB — COMPREHENSIVE METABOLIC PANEL
ALT: 11 U/L (ref 0–35)
Alkaline Phosphatase: 55 U/L (ref 39–117)
Sodium: 141 mEq/L (ref 135–145)
Total Bilirubin: 0.4 mg/dL (ref 0.3–1.2)
Total Protein: 6.5 g/dL (ref 6.0–8.3)

## 2009-03-23 LAB — KAPPA/LAMBDA LIGHT CHAINS
Kappa free light chain: 0.87 mg/dL (ref 0.33–1.94)
Lambda Free Lght Chn: 3.15 mg/dL — ABNORMAL HIGH (ref 0.57–2.63)

## 2009-03-23 LAB — IGG, IGA, IGM: IgM, Serum: 46 mg/dL — ABNORMAL LOW (ref 60–263)

## 2009-04-19 ENCOUNTER — Ambulatory Visit: Payer: Self-pay | Admitting: Internal Medicine

## 2009-04-19 LAB — BASIC METABOLIC PANEL
Calcium: 9.6 mg/dL (ref 8.4–10.5)
Glucose, Bld: 128 mg/dL — ABNORMAL HIGH (ref 70–99)
Potassium: 3.8 mEq/L (ref 3.5–5.3)
Sodium: 139 mEq/L (ref 135–145)

## 2009-04-19 LAB — CBC WITH DIFFERENTIAL/PLATELET
BASO%: 1.4 % (ref 0.0–2.0)
Basophils Absolute: 0.1 10*3/uL (ref 0.0–0.1)
Eosinophils Absolute: 0.4 10*3/uL (ref 0.0–0.5)
HCT: 33.3 % — ABNORMAL LOW (ref 34.8–46.6)
HGB: 11.3 g/dL — ABNORMAL LOW (ref 11.6–15.9)
LYMPH%: 14.9 % (ref 14.0–49.7)
MCHC: 33.9 g/dL (ref 31.5–36.0)
MONO#: 0.5 10*3/uL (ref 0.1–0.9)
NEUT%: 57.5 % (ref 38.4–76.8)
Platelets: 183 10*3/uL (ref 145–400)
WBC: 3.5 10*3/uL — ABNORMAL LOW (ref 3.9–10.3)
lymph#: 0.5 10*3/uL — ABNORMAL LOW (ref 0.9–3.3)

## 2009-04-19 LAB — PROTIME-INR
INR: 2.2 (ref 2.00–3.50)
Protime: 26.4 Seconds — ABNORMAL HIGH (ref 10.6–13.4)

## 2009-04-23 LAB — COMPREHENSIVE METABOLIC PANEL
AST: 14 U/L (ref 0–37)
Albumin: 4 g/dL (ref 3.5–5.2)
BUN: 16 mg/dL (ref 6–23)
CO2: 25 mEq/L (ref 19–32)
Calcium: 9.9 mg/dL (ref 8.4–10.5)
Chloride: 102 mEq/L (ref 96–112)
Creatinine, Ser: 1.01 mg/dL (ref 0.40–1.20)
Potassium: 3.5 mEq/L (ref 3.5–5.3)

## 2009-04-23 LAB — CBC WITH DIFFERENTIAL/PLATELET
Basophils Absolute: 0 10*3/uL (ref 0.0–0.1)
EOS%: 0 % (ref 0.0–7.0)
Eosinophils Absolute: 0 10*3/uL (ref 0.0–0.5)
HCT: 32.4 % — ABNORMAL LOW (ref 34.8–46.6)
HGB: 11 g/dL — ABNORMAL LOW (ref 11.6–15.9)
LYMPH%: 15.4 % (ref 14.0–49.7)
MCH: 31.7 pg (ref 25.1–34.0)
MCV: 93.5 fL (ref 79.5–101.0)
MONO%: 15.6 % — ABNORMAL HIGH (ref 0.0–14.0)
NEUT#: 2 10*3/uL (ref 1.5–6.5)
NEUT%: 68.5 % (ref 38.4–76.8)
Platelets: 234 10*3/uL (ref 145–400)
RDW: 16.2 % — ABNORMAL HIGH (ref 11.2–14.5)

## 2009-04-23 LAB — LACTATE DEHYDROGENASE: LDH: 161 U/L (ref 94–250)

## 2009-04-23 LAB — PROTIME-INR: INR: 3 (ref 2.00–3.50)

## 2009-05-21 ENCOUNTER — Ambulatory Visit: Payer: Self-pay | Admitting: Internal Medicine

## 2009-05-21 LAB — CBC WITH DIFFERENTIAL/PLATELET
BASO%: 1.6 % (ref 0.0–2.0)
Basophils Absolute: 0.1 10*3/uL (ref 0.0–0.1)
Eosinophils Absolute: 0.1 10*3/uL (ref 0.0–0.5)
HCT: 32.1 % — ABNORMAL LOW (ref 34.8–46.6)
HGB: 10.7 g/dL — ABNORMAL LOW (ref 11.6–15.9)
MONO#: 0.7 10*3/uL (ref 0.1–0.9)
NEUT#: 2.4 10*3/uL (ref 1.5–6.5)
NEUT%: 63 % (ref 38.4–76.8)
WBC: 3.8 10*3/uL — ABNORMAL LOW (ref 3.9–10.3)
lymph#: 0.5 10*3/uL — ABNORMAL LOW (ref 0.9–3.3)

## 2009-05-21 LAB — COMPREHENSIVE METABOLIC PANEL
BUN: 18 mg/dL (ref 6–23)
CO2: 29 mEq/L (ref 19–32)
Calcium: 10 mg/dL (ref 8.4–10.5)
Chloride: 103 mEq/L (ref 96–112)
Creatinine, Ser: 1.02 mg/dL (ref 0.40–1.20)
Total Bilirubin: 0.3 mg/dL (ref 0.3–1.2)

## 2009-05-21 LAB — PROTIME-INR

## 2009-06-11 LAB — CBC WITH DIFFERENTIAL/PLATELET
BASO%: 2.4 % — ABNORMAL HIGH (ref 0.0–2.0)
EOS%: 21.3 % — ABNORMAL HIGH (ref 0.0–7.0)
HCT: 34.5 % — ABNORMAL LOW (ref 34.8–46.6)
LYMPH%: 20.9 % (ref 14.0–49.7)
MCH: 30.7 pg (ref 25.1–34.0)
MCHC: 33 g/dL (ref 31.5–36.0)
NEUT%: 43 % (ref 38.4–76.8)
RBC: 3.71 10*6/uL (ref 3.70–5.45)
lymph#: 0.5 10*3/uL — ABNORMAL LOW (ref 0.9–3.3)

## 2009-06-12 LAB — COMPREHENSIVE METABOLIC PANEL
ALT: 14 U/L (ref 0–35)
AST: 13 U/L (ref 0–37)
Alkaline Phosphatase: 48 U/L (ref 39–117)
BUN: 14 mg/dL (ref 6–23)
Calcium: 9.3 mg/dL (ref 8.4–10.5)
Chloride: 100 mEq/L (ref 96–112)
Creatinine, Ser: 0.98 mg/dL (ref 0.40–1.20)
Potassium: 3.7 mEq/L (ref 3.5–5.3)

## 2009-06-12 LAB — BETA 2 MICROGLOBULIN, SERUM: Beta-2 Microglobulin: 2.51 mg/L — ABNORMAL HIGH (ref 1.01–1.73)

## 2009-06-12 LAB — KAPPA/LAMBDA LIGHT CHAINS
Kappa free light chain: 2.11 mg/dL — ABNORMAL HIGH (ref 0.33–1.94)
Kappa:Lambda Ratio: 0.41 (ref 0.26–1.65)
Lambda Free Lght Chn: 5.13 mg/dL — ABNORMAL HIGH (ref 0.57–2.63)

## 2009-06-12 LAB — IGG, IGA, IGM: IgM, Serum: 355 mg/dL — ABNORMAL HIGH (ref 60–263)

## 2009-06-14 ENCOUNTER — Ambulatory Visit: Payer: Self-pay | Admitting: Internal Medicine

## 2009-06-29 ENCOUNTER — Ambulatory Visit: Payer: Self-pay | Admitting: Surgery

## 2009-06-29 ENCOUNTER — Ambulatory Visit: Admission: RE | Admit: 2009-06-29 | Discharge: 2009-06-29 | Payer: Self-pay | Admitting: Internal Medicine

## 2009-06-29 ENCOUNTER — Encounter: Payer: Self-pay | Admitting: Internal Medicine

## 2009-06-29 LAB — CBC WITH DIFFERENTIAL/PLATELET
BASO%: 0.4 % (ref 0.0–2.0)
EOS%: 0.4 % (ref 0.0–7.0)
HCT: 33.9 % — ABNORMAL LOW (ref 34.8–46.6)
MCH: 31.6 pg (ref 25.1–34.0)
MCHC: 33.3 g/dL (ref 31.5–36.0)
MONO#: 0.1 10*3/uL (ref 0.1–0.9)
RBC: 3.58 10*6/uL — ABNORMAL LOW (ref 3.70–5.45)
RDW: 15 % — ABNORMAL HIGH (ref 11.2–14.5)
WBC: 7 10*3/uL (ref 3.9–10.3)
lymph#: 0.7 10*3/uL — ABNORMAL LOW (ref 0.9–3.3)

## 2009-06-29 LAB — PROTIME-INR
INR: 1.3 — ABNORMAL LOW (ref 2.00–3.50)
Protime: 15.6 Seconds — ABNORMAL HIGH (ref 10.6–13.4)

## 2009-07-03 LAB — PROTIME-INR: Protime: 16.8 Seconds — ABNORMAL HIGH (ref 10.6–13.4)

## 2009-07-06 LAB — PROTIME-INR
INR: 2 (ref 2.00–3.50)
Protime: 24 Seconds — ABNORMAL HIGH (ref 10.6–13.4)

## 2009-07-12 ENCOUNTER — Ambulatory Visit: Payer: Self-pay | Admitting: Internal Medicine

## 2009-07-12 LAB — BASIC METABOLIC PANEL
BUN: 13 mg/dL (ref 6–23)
Chloride: 100 mEq/L (ref 96–112)
Glucose, Bld: 128 mg/dL — ABNORMAL HIGH (ref 70–99)
Potassium: 4.1 mEq/L (ref 3.5–5.3)

## 2009-07-19 LAB — CBC WITH DIFFERENTIAL/PLATELET
Basophils Absolute: 0 10*3/uL (ref 0.0–0.1)
EOS%: 0 % (ref 0.0–7.0)
Eosinophils Absolute: 0 10*3/uL (ref 0.0–0.5)
HCT: 30.9 % — ABNORMAL LOW (ref 34.8–46.6)
HGB: 10.4 g/dL — ABNORMAL LOW (ref 11.6–15.9)
MONO#: 0.4 10*3/uL (ref 0.1–0.9)
NEUT#: 1.9 10*3/uL (ref 1.5–6.5)
NEUT%: 62.9 % (ref 38.4–76.8)
RDW: 16.5 % — ABNORMAL HIGH (ref 11.2–14.5)
lymph#: 0.7 10*3/uL — ABNORMAL LOW (ref 0.9–3.3)

## 2009-07-19 LAB — COMPREHENSIVE METABOLIC PANEL
ALT: 12 U/L (ref 0–35)
Albumin: 4.1 g/dL (ref 3.5–5.2)
CO2: 26 mEq/L (ref 19–32)
Chloride: 103 mEq/L (ref 96–112)
Glucose, Bld: 110 mg/dL — ABNORMAL HIGH (ref 70–99)
Potassium: 4 mEq/L (ref 3.5–5.3)
Sodium: 139 mEq/L (ref 135–145)
Total Bilirubin: 0.3 mg/dL (ref 0.3–1.2)
Total Protein: 6.1 g/dL (ref 6.0–8.3)

## 2009-07-19 LAB — PROTHROMBIN TIME: INR: 2.56 — ABNORMAL HIGH (ref <1.50)

## 2009-07-19 LAB — LACTATE DEHYDROGENASE: LDH: 178 U/L (ref 94–250)

## 2009-08-02 LAB — PROTIME-INR
INR: 3.1 (ref 2.00–3.50)
Protime: 37.2 Seconds — ABNORMAL HIGH (ref 10.6–13.4)

## 2009-08-09 LAB — PROTIME-INR: Protime: 28.8 Seconds — ABNORMAL HIGH (ref 10.6–13.4)

## 2009-08-14 ENCOUNTER — Ambulatory Visit: Payer: Self-pay | Admitting: Internal Medicine

## 2009-08-16 LAB — CBC WITH DIFFERENTIAL/PLATELET
Basophils Absolute: 0 10*3/uL (ref 0.0–0.1)
Eosinophils Absolute: 0 10*3/uL (ref 0.0–0.5)
HGB: 10.1 g/dL — ABNORMAL LOW (ref 11.6–15.9)
LYMPH%: 19.8 % (ref 14.0–49.7)
MCH: 32 pg (ref 25.1–34.0)
MCV: 94.4 fL (ref 79.5–101.0)
MONO%: 15.6 % — ABNORMAL HIGH (ref 0.0–14.0)
NEUT#: 2.1 10*3/uL (ref 1.5–6.5)
Platelets: 286 10*3/uL (ref 145–400)

## 2009-08-20 LAB — COMPREHENSIVE METABOLIC PANEL
Albumin: 3.7 g/dL (ref 3.5–5.2)
BUN: 13 mg/dL (ref 6–23)
CO2: 24 mEq/L (ref 19–32)
Calcium: 9.4 mg/dL (ref 8.4–10.5)
Chloride: 109 mEq/L (ref 96–112)
Glucose, Bld: 133 mg/dL — ABNORMAL HIGH (ref 70–99)
Potassium: 3.5 mEq/L (ref 3.5–5.3)

## 2009-08-20 LAB — PROTEIN ELECTROPHORESIS, SERUM
Alpha-1-Globulin: 5.6 % — ABNORMAL HIGH (ref 2.9–4.9)
Alpha-2-Globulin: 13 % — ABNORMAL HIGH (ref 7.1–11.8)
Beta 2: 4.7 % (ref 3.2–6.5)
Gamma Globulin: 10 % — ABNORMAL LOW (ref 11.1–18.8)

## 2009-08-20 LAB — IGG, IGA, IGM: IgA: 133 mg/dL (ref 68–378)

## 2009-08-23 LAB — CBC WITH DIFFERENTIAL/PLATELET
Basophils Absolute: 0.1 10*3/uL (ref 0.0–0.1)
EOS%: 6.7 % (ref 0.0–7.0)
HCT: 33.8 % — ABNORMAL LOW (ref 34.8–46.6)
HGB: 11 g/dL — ABNORMAL LOW (ref 11.6–15.9)
LYMPH%: 19.7 % (ref 14.0–49.7)
MCH: 30.1 pg (ref 25.1–34.0)
MCV: 92.6 fL (ref 79.5–101.0)
MONO%: 8 % (ref 0.0–14.0)
NEUT%: 63.9 % (ref 38.4–76.8)

## 2009-08-23 LAB — PROTIME-INR

## 2009-09-06 LAB — BASIC METABOLIC PANEL
CO2: 27 mEq/L (ref 19–32)
Calcium: 9 mg/dL (ref 8.4–10.5)
Chloride: 102 mEq/L (ref 96–112)
Sodium: 139 mEq/L (ref 135–145)

## 2009-09-06 LAB — PROTIME-INR

## 2009-09-18 ENCOUNTER — Ambulatory Visit: Payer: Self-pay | Admitting: Internal Medicine

## 2009-09-20 LAB — COMPREHENSIVE METABOLIC PANEL
ALT: 14 U/L (ref 0–35)
AST: 13 U/L (ref 0–37)
Alkaline Phosphatase: 45 U/L (ref 39–117)
Creatinine, Ser: 0.93 mg/dL (ref 0.40–1.20)
Sodium: 139 mEq/L (ref 135–145)
Total Bilirubin: 0.3 mg/dL (ref 0.3–1.2)
Total Protein: 6.3 g/dL (ref 6.0–8.3)

## 2009-09-20 LAB — CBC WITH DIFFERENTIAL/PLATELET
Basophils Absolute: 0 10*3/uL (ref 0.0–0.1)
EOS%: 1.8 % (ref 0.0–7.0)
HCT: 34.2 % — ABNORMAL LOW (ref 34.8–46.6)
HGB: 11.6 g/dL (ref 11.6–15.9)
MCH: 32 pg (ref 25.1–34.0)
MCV: 94.3 fL (ref 79.5–101.0)
NEUT%: 74.3 % (ref 38.4–76.8)
lymph#: 0.5 10*3/uL — ABNORMAL LOW (ref 0.9–3.3)

## 2009-09-20 LAB — PROTIME-INR

## 2009-10-11 LAB — CBC WITH DIFFERENTIAL/PLATELET
Basophils Absolute: 0 10*3/uL (ref 0.0–0.1)
EOS%: 0 % (ref 0.0–7.0)
MCH: 31.8 pg (ref 25.1–34.0)
MCV: 95.2 fL (ref 79.5–101.0)
MONO%: 13.3 % (ref 0.0–14.0)
RBC: 3.46 10*6/uL — ABNORMAL LOW (ref 3.70–5.45)
RDW: 17.6 % — ABNORMAL HIGH (ref 11.2–14.5)

## 2009-10-11 LAB — PROTIME-INR
INR: 2.8 (ref 2.00–3.50)
Protime: 33.6 Seconds — ABNORMAL HIGH (ref 10.6–13.4)

## 2009-10-12 LAB — COMPREHENSIVE METABOLIC PANEL
AST: 14 U/L (ref 0–37)
BUN: 17 mg/dL (ref 6–23)
Calcium: 9.1 mg/dL (ref 8.4–10.5)
Chloride: 106 mEq/L (ref 96–112)
Creatinine, Ser: 1 mg/dL (ref 0.40–1.20)

## 2009-10-12 LAB — IGG, IGA, IGM
IgA: 128 mg/dL (ref 68–378)
IgG (Immunoglobin G), Serum: 562 mg/dL — ABNORMAL LOW (ref 694–1618)
IgM, Serum: 78 mg/dL (ref 60–263)

## 2009-10-12 LAB — KAPPA/LAMBDA LIGHT CHAINS: Lambda Free Lght Chn: 2.15 mg/dL (ref 0.57–2.63)

## 2009-10-16 ENCOUNTER — Ambulatory Visit: Payer: Self-pay | Admitting: Internal Medicine

## 2009-10-18 LAB — PROTIME-INR

## 2009-11-01 LAB — BASIC METABOLIC PANEL
BUN: 13 mg/dL (ref 6–23)
CO2: 22 mEq/L (ref 19–32)
Chloride: 104 mEq/L (ref 96–112)
Glucose, Bld: 162 mg/dL — ABNORMAL HIGH (ref 70–99)
Potassium: 3.7 mEq/L (ref 3.5–5.3)

## 2009-11-13 ENCOUNTER — Ambulatory Visit: Payer: Self-pay | Admitting: Internal Medicine

## 2009-11-15 LAB — CBC WITH DIFFERENTIAL/PLATELET
BASO%: 0.7 % (ref 0.0–2.0)
EOS%: 3.6 % (ref 0.0–7.0)
LYMPH%: 14.1 % (ref 14.0–49.7)
MCH: 32.4 pg (ref 25.1–34.0)
MCHC: 34.2 g/dL (ref 31.5–36.0)
MCV: 94.7 fL (ref 79.5–101.0)
MONO%: 6 % (ref 0.0–14.0)
NEUT%: 75.6 % (ref 38.4–76.8)
Platelets: 211 10*3/uL (ref 145–400)
RBC: 3.42 10*6/uL — ABNORMAL LOW (ref 3.70–5.45)
WBC: 3.1 10*3/uL — ABNORMAL LOW (ref 3.9–10.3)

## 2009-11-15 LAB — COMPREHENSIVE METABOLIC PANEL
Alkaline Phosphatase: 49 U/L (ref 39–117)
BUN: 10 mg/dL (ref 6–23)
CO2: 28 mEq/L (ref 19–32)
Glucose, Bld: 184 mg/dL — ABNORMAL HIGH (ref 70–99)
Total Bilirubin: 0.3 mg/dL (ref 0.3–1.2)

## 2009-11-15 LAB — LACTATE DEHYDROGENASE: LDH: 173 U/L (ref 94–250)

## 2009-11-15 LAB — PROTIME-INR: Protime: 18 Seconds — ABNORMAL HIGH (ref 10.6–13.4)

## 2009-11-29 LAB — BASIC METABOLIC PANEL
BUN: 13 mg/dL (ref 6–23)
Calcium: 9.3 mg/dL (ref 8.4–10.5)
Glucose, Bld: 96 mg/dL (ref 70–99)
Potassium: 3.8 mEq/L (ref 3.5–5.3)
Sodium: 140 mEq/L (ref 135–145)

## 2009-11-29 LAB — PROTIME-INR
INR: 2.4 (ref 2.00–3.50)
Protime: 28.8 Seconds — ABNORMAL HIGH (ref 10.6–13.4)

## 2009-12-06 LAB — CBC WITH DIFFERENTIAL/PLATELET
Eosinophils Absolute: 0 10*3/uL (ref 0.0–0.5)
HCT: 33.3 % — ABNORMAL LOW (ref 34.8–46.6)
LYMPH%: 18.2 % (ref 14.0–49.7)
MCHC: 33.2 g/dL (ref 31.5–36.0)
MCV: 95.8 fL (ref 79.5–101.0)
MONO#: 0.4 10*3/uL (ref 0.1–0.9)
MONO%: 12.4 % (ref 0.0–14.0)
NEUT#: 2.2 10*3/uL (ref 1.5–6.5)
NEUT%: 68 % (ref 38.4–76.8)
Platelets: 306 10*3/uL (ref 145–400)
RBC: 3.47 10*6/uL — ABNORMAL LOW (ref 3.70–5.45)
WBC: 3.3 10*3/uL — ABNORMAL LOW (ref 3.9–10.3)

## 2009-12-07 LAB — COMPREHENSIVE METABOLIC PANEL
Alkaline Phosphatase: 47 U/L (ref 39–117)
CO2: 22 mEq/L (ref 19–32)
Creatinine, Ser: 1.26 mg/dL — ABNORMAL HIGH (ref 0.40–1.20)
Glucose, Bld: 124 mg/dL — ABNORMAL HIGH (ref 70–99)
Sodium: 141 mEq/L (ref 135–145)
Total Bilirubin: 0.3 mg/dL (ref 0.3–1.2)

## 2009-12-07 LAB — IGG, IGA, IGM
IgA: 158 mg/dL (ref 68–378)
IgG (Immunoglobin G), Serum: 654 mg/dL — ABNORMAL LOW (ref 694–1618)
IgM, Serum: 82 mg/dL (ref 60–263)

## 2009-12-07 LAB — BETA 2 MICROGLOBULIN, SERUM: Beta-2 Microglobulin: 1.64 mg/L (ref 1.01–1.73)

## 2009-12-07 LAB — LACTATE DEHYDROGENASE: LDH: 185 U/L (ref 94–250)

## 2009-12-07 LAB — KAPPA/LAMBDA LIGHT CHAINS: Kappa free light chain: 0.83 mg/dL (ref 0.33–1.94)

## 2009-12-13 ENCOUNTER — Ambulatory Visit: Payer: Self-pay | Admitting: Internal Medicine

## 2009-12-13 LAB — CBC WITH DIFFERENTIAL/PLATELET
BASO%: 2.4 % — ABNORMAL HIGH (ref 0.0–2.0)
Basophils Absolute: 0.1 10*3/uL (ref 0.0–0.1)
EOS%: 0 % (ref 0.0–7.0)
Eosinophils Absolute: 0 10*3/uL (ref 0.0–0.5)
HCT: 31.9 % — ABNORMAL LOW (ref 34.8–46.6)
HGB: 10.9 g/dL — ABNORMAL LOW (ref 11.6–15.9)
LYMPH%: 17.6 % (ref 14.0–49.7)
MCH: 32.3 pg (ref 25.1–34.0)
MCV: 94.7 fL (ref 79.5–101.0)
MONO%: 10.1 % (ref 0.0–14.0)
NEUT#: 2 10*3/uL (ref 1.5–6.5)
NEUT%: 69.9 % (ref 38.4–76.8)
Platelets: 234 10*3/uL (ref 145–400)
RDW: 16.7 % — ABNORMAL HIGH (ref 11.2–14.5)

## 2009-12-13 LAB — PROTIME-INR
INR: 2.5 (ref 2.00–3.50)
Protime: 30 Seconds — ABNORMAL HIGH (ref 10.6–13.4)

## 2009-12-13 LAB — COMPREHENSIVE METABOLIC PANEL
AST: 17 U/L (ref 0–37)
Alkaline Phosphatase: 47 U/L (ref 39–117)
BUN: 13 mg/dL (ref 6–23)
Calcium: 9.6 mg/dL (ref 8.4–10.5)
Chloride: 103 mEq/L (ref 96–112)
Creatinine, Ser: 1.1 mg/dL (ref 0.40–1.20)

## 2009-12-17 ENCOUNTER — Encounter: Admission: RE | Admit: 2009-12-17 | Discharge: 2009-12-17 | Payer: Self-pay | Admitting: Family Medicine

## 2009-12-27 LAB — BASIC METABOLIC PANEL
BUN: 13 mg/dL (ref 6–23)
Chloride: 102 mEq/L (ref 96–112)
Potassium: 3.6 mEq/L (ref 3.5–5.3)
Sodium: 138 mEq/L (ref 135–145)

## 2010-01-14 ENCOUNTER — Ambulatory Visit: Payer: Self-pay | Admitting: Internal Medicine

## 2010-01-15 ENCOUNTER — Encounter: Payer: Self-pay | Admitting: Family Medicine

## 2010-01-15 LAB — CBC WITH DIFFERENTIAL/PLATELET
BASO%: 1.9 % (ref 0.0–2.0)
EOS%: 8 % — ABNORMAL HIGH (ref 0.0–7.0)
HGB: 10.8 g/dL — ABNORMAL LOW (ref 11.6–15.9)
MCH: 30.9 pg (ref 25.1–34.0)
MCHC: 32.7 g/dL (ref 31.5–36.0)
MCV: 94.6 fL (ref 79.5–101.0)
MONO%: 12.4 % (ref 0.0–14.0)
RBC: 3.49 10*6/uL — ABNORMAL LOW (ref 3.70–5.45)
RDW: 14.9 % — ABNORMAL HIGH (ref 11.2–14.5)
lymph#: 0.5 10*3/uL — ABNORMAL LOW (ref 0.9–3.3)
nRBC: 0 % (ref 0–0)

## 2010-01-15 LAB — COMPREHENSIVE METABOLIC PANEL
AST: 14 U/L (ref 0–37)
Albumin: 3.7 g/dL (ref 3.5–5.2)
Alkaline Phosphatase: 42 U/L (ref 39–117)
Potassium: 4 mEq/L (ref 3.5–5.3)
Sodium: 138 mEq/L (ref 135–145)
Total Bilirubin: 0.3 mg/dL (ref 0.3–1.2)
Total Protein: 5.8 g/dL — ABNORMAL LOW (ref 6.0–8.3)

## 2010-01-15 LAB — PROTIME-INR: INR: 2.4 (ref 2.00–3.50)

## 2010-02-13 ENCOUNTER — Encounter: Payer: Self-pay | Admitting: Family Medicine

## 2010-02-13 ENCOUNTER — Ambulatory Visit: Payer: Self-pay | Admitting: Internal Medicine

## 2010-02-13 LAB — PROTIME-INR
INR: 3 (ref 2.00–3.50)
Protime: 36 Seconds — ABNORMAL HIGH (ref 10.6–13.4)

## 2010-02-13 LAB — COMPREHENSIVE METABOLIC PANEL
ALT: 13 U/L (ref 0–35)
AST: 15 U/L (ref 0–37)
Alkaline Phosphatase: 40 U/L (ref 39–117)
BUN: 17 mg/dL (ref 6–23)
Calcium: 9.5 mg/dL (ref 8.4–10.5)
Creatinine, Ser: 1.04 mg/dL (ref 0.40–1.20)
Total Bilirubin: 0.2 mg/dL — ABNORMAL LOW (ref 0.3–1.2)

## 2010-02-13 LAB — CBC WITH DIFFERENTIAL/PLATELET
BASO%: 0.4 % (ref 0.0–2.0)
Basophils Absolute: 0 10*3/uL (ref 0.0–0.1)
EOS%: 3.8 % (ref 0.0–7.0)
HGB: 11.3 g/dL — ABNORMAL LOW (ref 11.6–15.9)
MCH: 32.4 pg (ref 25.1–34.0)
MCHC: 34.2 g/dL (ref 31.5–36.0)
MCV: 94.9 fL (ref 79.5–101.0)
MONO%: 7.6 % (ref 0.0–14.0)
RBC: 3.49 10*6/uL — ABNORMAL LOW (ref 3.70–5.45)
RDW: 16.5 % — ABNORMAL HIGH (ref 11.2–14.5)
lymph#: 0.5 10*3/uL — ABNORMAL LOW (ref 0.9–3.3)

## 2010-02-21 LAB — BASIC METABOLIC PANEL
BUN: 16 mg/dL (ref 6–23)
CO2: 27 mEq/L (ref 19–32)
Chloride: 101 mEq/L (ref 96–112)
Creatinine, Ser: 1.04 mg/dL (ref 0.40–1.20)
Potassium: 3.8 mEq/L (ref 3.5–5.3)

## 2010-02-28 ENCOUNTER — Encounter: Payer: Self-pay | Admitting: Internal Medicine

## 2010-02-28 ENCOUNTER — Ambulatory Visit: Admission: RE | Admit: 2010-02-28 | Discharge: 2010-02-28 | Payer: Self-pay | Admitting: Internal Medicine

## 2010-02-28 ENCOUNTER — Ambulatory Visit: Payer: Self-pay | Admitting: Vascular Surgery

## 2010-03-06 ENCOUNTER — Encounter: Payer: Self-pay | Admitting: Family Medicine

## 2010-03-06 LAB — CBC WITH DIFFERENTIAL/PLATELET
Eosinophils Absolute: 0 10*3/uL (ref 0.0–0.5)
MONO#: 0.4 10*3/uL (ref 0.1–0.9)
NEUT#: 2.1 10*3/uL (ref 1.5–6.5)
RBC: 3.57 10*6/uL — ABNORMAL LOW (ref 3.70–5.45)
RDW: 16 % — ABNORMAL HIGH (ref 11.2–14.5)
WBC: 3.1 10*3/uL — ABNORMAL LOW (ref 3.9–10.3)
lymph#: 0.6 10*3/uL — ABNORMAL LOW (ref 0.9–3.3)

## 2010-03-06 LAB — PROTIME-INR
INR: 4 — ABNORMAL HIGH (ref 2.00–3.50)
Protime: 48 Seconds — ABNORMAL HIGH (ref 10.6–13.4)

## 2010-03-07 LAB — COMPREHENSIVE METABOLIC PANEL
ALT: 16 U/L (ref 0–35)
AST: 14 U/L (ref 0–37)
Albumin: 4.1 g/dL (ref 3.5–5.2)
Alkaline Phosphatase: 41 U/L (ref 39–117)
BUN: 18 mg/dL (ref 6–23)
Potassium: 3.5 mEq/L (ref 3.5–5.3)
Sodium: 139 mEq/L (ref 135–145)

## 2010-03-07 LAB — IGG, IGA, IGM
IgA: 141 mg/dL (ref 68–378)
IgG (Immunoglobin G), Serum: 575 mg/dL — ABNORMAL LOW (ref 694–1618)

## 2010-03-07 LAB — KAPPA/LAMBDA LIGHT CHAINS: Kappa free light chain: <0.54 mg/dL (ref 0.33–1.94)

## 2010-03-07 LAB — BETA 2 MICROGLOBULIN, SERUM: Beta-2 Microglobulin: 1.92 mg/L — ABNORMAL HIGH (ref 1.01–1.73)

## 2010-03-13 ENCOUNTER — Encounter: Payer: Self-pay | Admitting: Family Medicine

## 2010-03-13 LAB — PROTIME-INR: INR: 1.6 — ABNORMAL LOW (ref 2.00–3.50)

## 2010-03-18 ENCOUNTER — Ambulatory Visit: Payer: Self-pay | Admitting: Internal Medicine

## 2010-03-20 ENCOUNTER — Encounter: Payer: Self-pay | Admitting: Family Medicine

## 2010-03-20 LAB — PROTIME-INR
INR: 3.1 (ref 2.00–3.50)
Protime: 37.2 Seconds — ABNORMAL HIGH (ref 10.6–13.4)

## 2010-03-29 ENCOUNTER — Ambulatory Visit: Payer: Self-pay | Admitting: Family Medicine

## 2010-03-29 DIAGNOSIS — M542 Cervicalgia: Secondary | ICD-10-CM | POA: Insufficient documentation

## 2010-03-29 DIAGNOSIS — I809 Phlebitis and thrombophlebitis of unspecified site: Secondary | ICD-10-CM

## 2010-03-29 DIAGNOSIS — C9002 Multiple myeloma in relapse: Secondary | ICD-10-CM | POA: Insufficient documentation

## 2010-03-29 DIAGNOSIS — F329 Major depressive disorder, single episode, unspecified: Secondary | ICD-10-CM | POA: Insufficient documentation

## 2010-03-29 DIAGNOSIS — Z9189 Other specified personal risk factors, not elsewhere classified: Secondary | ICD-10-CM | POA: Insufficient documentation

## 2010-03-29 DIAGNOSIS — M79609 Pain in unspecified limb: Secondary | ICD-10-CM

## 2010-03-29 DIAGNOSIS — E119 Type 2 diabetes mellitus without complications: Secondary | ICD-10-CM | POA: Insufficient documentation

## 2010-04-08 ENCOUNTER — Ambulatory Visit: Payer: Self-pay | Admitting: Family Medicine

## 2010-04-09 LAB — CONVERTED CEMR LAB
ALT: 16 units/L (ref 0–35)
BUN: 17 mg/dL (ref 6–23)
CO2: 31 meq/L (ref 19–32)
Chloride: 101 meq/L (ref 96–112)
Cholesterol: 200 mg/dL (ref 0–200)
Creatinine, Ser: 1.1 mg/dL (ref 0.4–1.2)
Total Bilirubin: 0.4 mg/dL (ref 0.3–1.2)
Total Protein: 6.3 g/dL (ref 6.0–8.3)
Triglycerides: 106 mg/dL (ref 0.0–149.0)

## 2010-04-10 LAB — COMPREHENSIVE METABOLIC PANEL
AST: 15 U/L (ref 0–37)
Alkaline Phosphatase: 46 U/L (ref 39–117)
BUN: 12 mg/dL (ref 6–23)
Glucose, Bld: 96 mg/dL (ref 70–99)
Sodium: 142 mEq/L (ref 135–145)
Total Bilirubin: 0.3 mg/dL (ref 0.3–1.2)

## 2010-04-10 LAB — CBC WITH DIFFERENTIAL/PLATELET
BASO%: 0.7 % (ref 0.0–2.0)
LYMPH%: 11.2 % — ABNORMAL LOW (ref 14.0–49.7)
MCHC: 33.3 g/dL (ref 31.5–36.0)
MONO#: 0.3 10*3/uL (ref 0.1–0.9)
MONO%: 6.5 % (ref 0.0–14.0)
NEUT#: 3.5 10*3/uL (ref 1.5–6.5)
Platelets: 243 10*3/uL (ref 145–400)
RBC: 3.66 10*6/uL — ABNORMAL LOW (ref 3.70–5.45)
RDW: 16.8 % — ABNORMAL HIGH (ref 11.2–14.5)
WBC: 4.4 10*3/uL (ref 3.9–10.3)

## 2010-04-10 LAB — PROTIME-INR
INR: 1.4 — ABNORMAL LOW (ref 2.00–3.50)
Protime: 16.8 Seconds — ABNORMAL HIGH (ref 10.6–13.4)

## 2010-04-12 ENCOUNTER — Telehealth (INDEPENDENT_AMBULATORY_CARE_PROVIDER_SITE_OTHER): Payer: Self-pay | Admitting: *Deleted

## 2010-04-22 ENCOUNTER — Ambulatory Visit: Payer: Self-pay | Admitting: Internal Medicine

## 2010-04-24 LAB — PROTIME-INR
INR: 3 (ref 2.00–3.50)
Protime: 36 Seconds — ABNORMAL HIGH (ref 10.6–13.4)

## 2010-05-08 LAB — COMPREHENSIVE METABOLIC PANEL
ALT: 14 U/L (ref 0–35)
Albumin: 4 g/dL (ref 3.5–5.2)
CO2: 32 mEq/L (ref 19–32)
Potassium: 3.9 mEq/L (ref 3.5–5.3)
Sodium: 140 mEq/L (ref 135–145)
Total Bilirubin: 0.3 mg/dL (ref 0.3–1.2)
Total Protein: 6.2 g/dL (ref 6.0–8.3)

## 2010-05-08 LAB — LACTATE DEHYDROGENASE: LDH: 171 U/L (ref 94–250)

## 2010-05-08 LAB — CBC WITH DIFFERENTIAL/PLATELET
BASO%: 0.4 % (ref 0.0–2.0)
LYMPH%: 17.9 % (ref 14.0–49.7)
MCHC: 33.3 g/dL (ref 31.5–36.0)
MONO#: 0.3 10*3/uL (ref 0.1–0.9)
NEUT#: 2.3 10*3/uL (ref 1.5–6.5)
Platelets: 261 10*3/uL (ref 145–400)
RBC: 3.71 10*6/uL (ref 3.70–5.45)
RDW: 16.4 % — ABNORMAL HIGH (ref 11.2–14.5)
WBC: 3.3 10*3/uL — ABNORMAL LOW (ref 3.9–10.3)
lymph#: 0.6 10*3/uL — ABNORMAL LOW (ref 0.9–3.3)

## 2010-05-08 LAB — PROTIME-INR
INR: 2.2 (ref 2.00–3.50)
Protime: 26.4 Seconds — ABNORMAL HIGH (ref 10.6–13.4)

## 2010-05-27 ENCOUNTER — Ambulatory Visit: Payer: Self-pay | Admitting: Internal Medicine

## 2010-05-29 LAB — CBC WITH DIFFERENTIAL/PLATELET
BASO%: 1.4 % (ref 0.0–2.0)
EOS%: 0 % (ref 0.0–7.0)
HCT: 34 % — ABNORMAL LOW (ref 34.8–46.6)
LYMPH%: 19.6 % (ref 14.0–49.7)
MCH: 33.1 pg (ref 25.1–34.0)
MCHC: 34.6 g/dL (ref 31.5–36.0)
NEUT%: 65.3 % (ref 38.4–76.8)
Platelets: 268 10*3/uL (ref 145–400)
lymph#: 0.6 10*3/uL — ABNORMAL LOW (ref 0.9–3.3)

## 2010-05-30 LAB — COMPREHENSIVE METABOLIC PANEL
CO2: 24 mEq/L (ref 19–32)
Creatinine, Ser: 1.12 mg/dL (ref 0.40–1.20)
Glucose, Bld: 150 mg/dL — ABNORMAL HIGH (ref 70–99)
Total Bilirubin: 0.4 mg/dL (ref 0.3–1.2)

## 2010-05-30 LAB — KAPPA/LAMBDA LIGHT CHAINS
Kappa free light chain: 0.67 mg/dL (ref 0.33–1.94)
Lambda Free Lght Chn: 2.4 mg/dL (ref 0.57–2.63)

## 2010-05-30 LAB — IGG, IGA, IGM
IgA: 155 mg/dL (ref 68–378)
IgG (Immunoglobin G), Serum: 607 mg/dL — ABNORMAL LOW (ref 694–1618)

## 2010-05-30 LAB — BETA 2 MICROGLOBULIN, SERUM: Beta-2 Microglobulin: 1.7 mg/L (ref 1.01–1.73)

## 2010-06-05 LAB — PROTIME-INR

## 2010-06-20 ENCOUNTER — Ambulatory Visit: Payer: Self-pay | Admitting: Family Medicine

## 2010-06-20 DIAGNOSIS — R197 Diarrhea, unspecified: Secondary | ICD-10-CM

## 2010-06-21 LAB — CONVERTED CEMR LAB
BUN: 13 mg/dL (ref 6–23)
CO2: 32 meq/L (ref 19–32)
Chloride: 103 meq/L (ref 96–112)
Glucose, Bld: 82 mg/dL (ref 70–99)
Potassium: 3.6 meq/L (ref 3.5–5.1)

## 2010-06-25 ENCOUNTER — Telehealth: Payer: Self-pay | Admitting: Family Medicine

## 2010-07-01 ENCOUNTER — Ambulatory Visit: Payer: Self-pay | Admitting: Internal Medicine

## 2010-07-03 ENCOUNTER — Encounter: Payer: Self-pay | Admitting: Family Medicine

## 2010-07-03 LAB — CBC WITH DIFFERENTIAL/PLATELET
Basophils Absolute: 0 10*3/uL (ref 0.0–0.1)
Eosinophils Absolute: 0 10*3/uL (ref 0.0–0.5)
HCT: 35 % (ref 34.8–46.6)
HGB: 12 g/dL (ref 11.6–15.9)
LYMPH%: 15.2 % (ref 14.0–49.7)
MCV: 95.7 fL (ref 79.5–101.0)
MONO#: 0.3 10*3/uL (ref 0.1–0.9)
MONO%: 8 % (ref 0.0–14.0)
NEUT#: 2.7 10*3/uL (ref 1.5–6.5)
NEUT%: 76.2 % (ref 38.4–76.8)
Platelets: 239 10*3/uL (ref 145–400)
WBC: 3.6 10*3/uL — ABNORMAL LOW (ref 3.9–10.3)

## 2010-07-03 LAB — COMPREHENSIVE METABOLIC PANEL
Albumin: 4 g/dL (ref 3.5–5.2)
Alkaline Phosphatase: 42 U/L (ref 39–117)
CO2: 29 mEq/L (ref 19–32)
Calcium: 9.5 mg/dL (ref 8.4–10.5)
Chloride: 101 mEq/L (ref 96–112)
Glucose, Bld: 100 mg/dL — ABNORMAL HIGH (ref 70–99)
Potassium: 3.6 mEq/L (ref 3.5–5.3)
Sodium: 140 mEq/L (ref 135–145)
Total Protein: 6.1 g/dL (ref 6.0–8.3)

## 2010-07-03 LAB — PROTIME-INR: Protime: 21.6 Seconds — ABNORMAL HIGH (ref 10.6–13.4)

## 2010-07-08 ENCOUNTER — Ambulatory Visit (HOSPITAL_COMMUNITY): Admission: RE | Admit: 2010-07-08 | Discharge: 2010-07-08 | Payer: Self-pay | Admitting: Internal Medicine

## 2010-07-08 ENCOUNTER — Encounter: Payer: Self-pay | Admitting: Internal Medicine

## 2010-07-10 ENCOUNTER — Encounter: Payer: Self-pay | Admitting: Family Medicine

## 2010-07-31 ENCOUNTER — Ambulatory Visit: Payer: Self-pay | Admitting: Internal Medicine

## 2010-07-31 LAB — CBC WITH DIFFERENTIAL/PLATELET
EOS%: 2.6 % (ref 0.0–7.0)
Eosinophils Absolute: 0.1 10*3/uL (ref 0.0–0.5)
MCV: 96.3 fL (ref 79.5–101.0)
MONO%: 5 % (ref 0.0–14.0)
NEUT#: 3.1 10*3/uL (ref 1.5–6.5)
RBC: 3.76 10*6/uL (ref 3.70–5.45)
RDW: 16.5 % — ABNORMAL HIGH (ref 11.2–14.5)
lymph#: 0.6 10*3/uL — ABNORMAL LOW (ref 0.9–3.3)

## 2010-07-31 LAB — PROTIME-INR
INR: 1.5 — ABNORMAL LOW (ref 2.00–3.50)
Protime: 18 Seconds — ABNORMAL HIGH (ref 10.6–13.4)

## 2010-08-02 ENCOUNTER — Ambulatory Visit
Admission: RE | Admit: 2010-08-02 | Discharge: 2010-08-29 | Payer: Self-pay | Source: Home / Self Care | Attending: Radiation Oncology | Admitting: Radiation Oncology

## 2010-08-05 ENCOUNTER — Encounter: Payer: Self-pay | Admitting: Family Medicine

## 2010-08-21 LAB — CBC WITH DIFFERENTIAL/PLATELET
Basophils Absolute: 0 10*3/uL (ref 0.0–0.1)
Eosinophils Absolute: 0 10*3/uL (ref 0.0–0.5)
HCT: 34.3 % — ABNORMAL LOW (ref 34.8–46.6)
HGB: 11.6 g/dL (ref 11.6–15.9)
MONO#: 0.4 10*3/uL (ref 0.1–0.9)
NEUT#: 2.3 10*3/uL (ref 1.5–6.5)
NEUT%: 70.5 % (ref 38.4–76.8)
WBC: 3.2 10*3/uL — ABNORMAL LOW (ref 3.9–10.3)
lymph#: 0.5 10*3/uL — ABNORMAL LOW (ref 0.9–3.3)

## 2010-08-21 LAB — PROTIME-INR: INR: 2.2 (ref 2.00–3.50)

## 2010-08-22 LAB — BETA 2 MICROGLOBULIN, SERUM: Beta-2 Microglobulin: 1.94 mg/L — ABNORMAL HIGH (ref 1.01–1.73)

## 2010-08-22 LAB — LACTATE DEHYDROGENASE: LDH: 174 U/L (ref 94–250)

## 2010-08-22 LAB — IGG, IGA, IGM
IgG (Immunoglobin G), Serum: 610 mg/dL — ABNORMAL LOW (ref 694–1618)
IgM, Serum: 114 mg/dL (ref 60–263)

## 2010-08-22 LAB — COMPREHENSIVE METABOLIC PANEL
ALT: 14 U/L (ref 0–35)
AST: 16 U/L (ref 0–37)
CO2: 28 mEq/L (ref 19–32)
Calcium: 9.3 mg/dL (ref 8.4–10.5)
Chloride: 104 mEq/L (ref 96–112)
Sodium: 141 mEq/L (ref 135–145)
Total Protein: 5.9 g/dL — ABNORMAL LOW (ref 6.0–8.3)

## 2010-08-22 LAB — KAPPA/LAMBDA LIGHT CHAINS: Kappa free light chain: 1.01 mg/dL (ref 0.33–1.94)

## 2010-08-28 ENCOUNTER — Encounter: Payer: Self-pay | Admitting: Family Medicine

## 2010-08-29 ENCOUNTER — Encounter: Payer: Self-pay | Admitting: Family Medicine

## 2010-09-04 ENCOUNTER — Ambulatory Visit: Payer: Self-pay | Admitting: Internal Medicine

## 2010-09-04 LAB — PROTIME-INR
INR: 2.2 (ref 2.00–3.50)
Protime: 26.4 Seconds — ABNORMAL HIGH (ref 10.6–13.4)

## 2010-09-22 ENCOUNTER — Encounter: Payer: Self-pay | Admitting: Family Medicine

## 2010-09-25 ENCOUNTER — Encounter: Payer: Self-pay | Admitting: Family Medicine

## 2010-09-25 LAB — CBC WITH DIFFERENTIAL/PLATELET
BASO%: 2.6 % — ABNORMAL HIGH (ref 0.0–2.0)
HCT: 35.8 % (ref 34.8–46.6)
MCHC: 33 g/dL (ref 31.5–36.0)
MONO#: 0.3 10*3/uL (ref 0.1–0.9)
NEUT%: 56.3 % (ref 38.4–76.8)
RDW: 15 % — ABNORMAL HIGH (ref 11.2–14.5)
WBC: 2.7 10*3/uL — ABNORMAL LOW (ref 3.9–10.3)
lymph#: 0.5 10*3/uL — ABNORMAL LOW (ref 0.9–3.3)
nRBC: 0 % (ref 0–0)

## 2010-09-25 LAB — PROTIME-INR: INR: 1.9 — ABNORMAL LOW (ref 2.00–3.50)

## 2010-09-25 LAB — COMPREHENSIVE METABOLIC PANEL
Albumin: 3.6 g/dL (ref 3.5–5.2)
BUN: 10 mg/dL (ref 6–23)
Calcium: 9.5 mg/dL (ref 8.4–10.5)
Chloride: 105 mEq/L (ref 96–112)
Glucose, Bld: 68 mg/dL — ABNORMAL LOW (ref 70–99)
Potassium: 4.2 mEq/L (ref 3.5–5.3)

## 2010-09-30 ENCOUNTER — Encounter: Payer: Self-pay | Admitting: Family Medicine

## 2010-10-01 NOTE — Assessment & Plan Note (Signed)
Summary: DIARRHEA X 3 WKS/CLE   Vital Signs:  Patient profile:   75 year old female Height:      61 inches Weight:      118.50 pounds BMI:     22.47 Temp:     98.1 degrees F oral Pulse rate:   76 / minute Pulse rhythm:   regular BP sitting:   110 / 70  (left arm) Cuff size:   regular  Vitals Entered By: Delilah Shan CMA Duncan Dull) (June 20, 2010 1:56 PM) CC: Diarrhea x 3 weeks   History of Present Illness: Diarrhea for 1 week and a few days.  Was throwing up but this stopped with procholorperazine.  No fevers.  Diarrhea is better today.  Was taking immodium several times a day.  No blood in stool.  No pain in abdomen.  Daughter had diarrhea recently but she got better quicker.  Appetite has decreased.  Feeling okay otherwise w/o another complaint.   Allergies: No Known Drug Allergies  Past History:  Past Medical History: BLOOD TRANSFUSION WITHOUT REPORTED DIAGNOSIS (ICD-V58.2) PHLEBITIS (ICD-451.9) Multiple myeloma per Dr. [name] DEPRESSION, MILD (ICD-311) CHICKENPOX, HX OF (ICD-V15.9) HYPERLIPIDEMIA (ICD-272.4) HYPERTENSION (ICD-401.9) DEGENERATIVE JOINT DISEASE (ICD-715.90) Steroid related DM2  Review of Systems       See HPI.  Otherwise negative.    Physical Exam  General:  GEN: nad, alert and oriented, smiling, not in pain HEENT: mucous membranes moist NECK: supple w/o LA CV: regular rate and rhythm  PULM: ctab, no inc wob ABD: soft, +bs EXT: no edema SKIN: no acute rash    Impression & Recommendations:  Problem # 1:  DIARRHEA (ICD-787.91) No known cause.  NAV resolved and no more diarrhea. Feeling well except for some fatigue, likely related to recent illness.  I would just observe and check  basic lytes today.  See notes on labs.  follow up as needed.  Orders: TLB-BMP (Basic Metabolic Panel-BMET) (80048-METABOL) TLB-Magnesium (Mg) (83735-MG)  Complete Medication List: 1)  Lisinopril-hydrochlorothiazide 20-12.5 Mg Tabs  (Lisinopril-hydrochlorothiazide) .... One tab by mouth once daily 2)  Advil 200 Mg Tabs (Ibuprofen) .... As needed 3)  Calcium Carbonate-vitamin D 600-400 Mg-unit Tabs (Calcium carbonate-vitamin d) .... Take 1 tablet by mouth three times a day 4)  Oxycontin 20 Mg Xr12h-tab (Oxycodone hcl) .... Take 1 tablet by mouth two times a day 5)  Warfarin Sodium 2 Mg Tabs (Warfarin sodium) .... Once daily as directed 6)  Warfarin Sodium 5 Mg Tabs (Warfarin sodium) .... Once daily as directed 7)  Alprazolam 0.5 Mg Tabs (Alprazolam) .... Take 1/2 tablet by mouth every morning and 1 tablet by mouth at bedtime as needed 8)  Glipizide 2.5 Mg Xr24h-tab (Glipizide) .... Take 1 tablet by mouth once a day 9)  Klor-con 20 Meq Pack (Potassium chloride) .... Take 1 tablet by mouth once a day 10)  Prochlorperazine Maleate 10 Mg Tabs (Prochlorperazine maleate) .... Take 1 tablet every 6 hours as needed for nausea and vomiting 11)  Revlimid 25 Mg Caps (Lenalidomide) .... Take 1 capsule per day for 21 days every 28 days. 12)  Dexamethasone 4 Mg Tabs (Dexamethasone) .... Take 10 tablets by mouth once weekly with food as directed. 13)  Magnesium Oxide 400 Mg Tabs (Magnesium oxide) .... Take 1 tablet by mouth three times a day 14)  Onetouch Ultra Test Strp (Glucose blood) .... Test blood sugar once daily or as directed. 15)  Onetouch Ultrasoft Lancets Misc (Lancets) .... Test blood sugar once daily or as directed.  16)  Oxycodone Hcl 5 Mg Tabs (Oxycodone hcl) .... Take 1 tablet every 6 hours as needed for pain.  Patient Instructions: 1)  We'll contact you with your lab report. Use the immodium two times a day as needed.  Let me know if the diarrhea is still going on next week.  Take care.  Prescriptions: PROCHLORPERAZINE MALEATE 10 MG TABS (PROCHLORPERAZINE MALEATE) Take 1 tablet every 6 hours as needed for nausea and vomiting  #30 x 3   Entered and Authorized by:   [name] MD   Signed by:   [name] MD on  06/20/2010   Method used:   Electronically to        [facility]* (retail)       [address] [place], [place]  [address]       Ph: [phone]       Fax: [phone]   RxID:   [id number]    Orders Added: 1)  Est. Patient Level III [93810] 2)  TLB-BMP (Basic Metabolic Panel-BMET) [80048-METABOL] 3)  TLB-Magnesium (Mg) [83735-MG]    Current Allergies (reviewed today): No known allergies

## 2010-10-01 NOTE — Letter (Signed)
Summary: [facility]  [facility]   Imported By: Maryln Gottron 07/29/2010 14:14:38  _____________________________________________________________________  External Attachment:    Type:   Image     Comment:   External Document

## 2010-10-01 NOTE — Letter (Signed)
[facility]   Imported By: [name] 04/11/2010 14:20:28  _____________________________________________________________________  External Attachment:    Type:   Image     Comment:   External Document

## 2010-10-01 NOTE — Assessment & Plan Note (Signed)
Summary: PAIN IN NECK AND LEGS/DLO   Vital Signs:  Patient profile:   75 year old female Height:      61 inches Weight:      120.50 pounds BMI:     22.85 Temp:     98.1 degrees F oral Pulse rate:   64 / minute Pulse rhythm:   regular BP sitting:   114 / 74  (left arm) Cuff size:   regular  Vitals Entered By: Delilah Shan CMA (AAMA) (March 29, 2010 10:00 AM) CC: Pain in neck and legs   History of Present Illness:  Initally went for knee replacement and that's when the patient found out about multiple myeloma.   "This isn't a new pain."  Prev eval for DVT per patient about 1 month ago and no actue clot was seen.  Report reviewed.  No trauma to cause pain.  No known trigger.  Has had to take breakthrough meds.   Pain in R leg, going on for about a week.    Neck pain started about 3 days ago.  No trauma.  Pain on L side of neck near occiput.  No midline pain.  +relief with pain meds.    No fevers.  No vomiting.  Nausea controlled.  No blood in stool but noted loose bowel last week.   Sugar- had been controlled and hadn't felt like she needed to check it. Dec in appetite noted. Weight has been stable in meantime.   Last blood work was last week.  this was reviewed today.   Pain meds throught Dr. [name].     Allergies (verified): No Known Drug Allergies  Past History:  Past Medical History: Current Problems:  HYPERLIPIDEMIA (ICD-272.4) HYPERTENSION (ICD-401.9) DEGENERATIVE JOINT DISEASE (ICD-715.90) Multiple myeloma per Dr. Shirline Frees. DM2, current on steroids for MM  Social History: Reviewed history and no changes required. Daughters involved in care. No tob, alcohol.  Review of Systems       See HPI.  Otherwise negative.    Physical Exam  General:  GEN: nad, alert and oriented HEENT: mucous membranes moist NECK: supple w/o LA, able to turn head but pain with rotation to L, no midline pain.  Not stiff. CV: regular rate and rhythm  PULM: ctab, no inc wob ABD:  soft, +bs EXT: no edema SKIN: no acute rash  R thigh w/o bruising.  Greater troch not tender to palpation. R knee not tender to palpation, no sig effusion at knee.  R lateral/distal thigh tender to palpation (proximal to knee).  SLR neg.     Impression & Recommendations:  Problem # 1:  NECK PAIN, LEFT (ICD-723.1) This is likely a muscle strain.  I would use local heat/stretching and her regular pain meds.  This should get better gradually.  Her updated medication list for this problem includes:    Advil 200 Mg Tabs (Ibuprofen) .Marland Kitchen... As needed    Hydrocodone-acetaminophen 5-500 Mg Tabs (Hydrocodone-acetaminophen) ..... One tablet every 6 hours as needed for break-through pain.    Oxycontin 20 Mg Xr12h-tab (Oxycodone hcl) .Marland Kitchen... Take 1 tablet by mouth two times a day  Problem # 2:  LEG PAIN, RIGHT (ICD-729.5) I reviewed the films and reports.  No indication of acute process.  This may be an OA flare.  i would use current meds and follow up as needed.  She and her daughters agree.  Orders: T-Knee Right 2 view (73560TC) T-Femur Right 2 views (73550TC) T-Femur Right 2 views (73550TC)  Complete Medication List:  1)  Lisinopril-hydrochlorothiazide 20-12.5 Mg Tabs (Lisinopril-hydrochlorothiazide) .... One tab by mouth once daily 2)  Advil 200 Mg Tabs (Ibuprofen) .... As needed 3)  Hydrocodone-acetaminophen 5-500 Mg Tabs (Hydrocodone-acetaminophen) .... One tablet every 6 hours as needed for break-through pain. 4)  Calcium Carbonate-vitamin D 600-400 Mg-unit Tabs (Calcium carbonate-vitamin d) .... Take 1 tablet by mouth three times a day 5)  Oxycontin 20 Mg Xr12h-tab (Oxycodone hcl) .... Take 1 tablet by mouth two times a day 6)  Warfarin Sodium 2 Mg Tabs (Warfarin sodium) .... Once daily as directed 7)  Warfarin Sodium 5 Mg Tabs (Warfarin sodium) .... Once daily as directed 8)  Alprazolam 0.5 Mg Tabs (Alprazolam) .... Take 1/2 tablet by mouth every morning and 1 tablet by mouth at bedtime as  needed 9)  Glipizide 2.5 Mg Xr24h-tab (Glipizide) .... Take 1 tablet by mouth once a day 10)  Klor-con 20 Meq Pack (Potassium chloride) .... Take 1 tablet by mouth once a day 11)  Prochlorperazine Maleate 10 Mg Tabs (Prochlorperazine maleate) .... Take 1 tablet every 6 hours as needed for nausea and vomiting 12)  Revlimid 25 Mg Caps (Lenalidomide) .... Take 1 capsule per day for 21 days every 28 days. 13)  Dexamethasone 4 Mg Tabs (Dexamethasone) .... Take 10 tablets by mouth once weekly with food as directed. 14)  Magnesium Oxide 400 Mg Tabs (Magnesium oxide) .... Take 1 tablet by mouth three times a day  Patient Instructions: 1)  Use your regular pain meds and let me know if it isn't getting better.  I would like to see you back in a few months to check up on your sugar.  Take care.   Current Allergies (reviewed today): No known allergies  Appended Document: PAIN IN NECK AND LEGS/DLO    Clinical Lists Changes         Allergies: No Known Drug Allergies

## 2010-10-01 NOTE — Letter (Signed)
[facility]   Imported By: [name] 04/11/2010 14:19:22  _____________________________________________________________________  External Attachment:    Type:   Image     Comment:   External Document

## 2010-10-01 NOTE — Letter (Signed)
[facility]   Imported By: [name] 04/11/2010 14:18:30  _____________________________________________________________________  External Attachment:    Type:   Image     Comment:   External Document

## 2010-10-01 NOTE — Letter (Signed)
[facility]   Imported By: [name] 04/11/2010 14:17:34  _____________________________________________________________________  External Attachment:    Type:   Image     Comment:   External Document

## 2010-10-01 NOTE — Letter (Signed)
Summary: Wynona Luna, O.D.  Wynona Luna, O.D.   Imported By: Maryln Gottron 07/18/2010 14:05:14  _____________________________________________________________________  External Attachment:    Type:   Image     Comment:   External Document  Appended Document: Wynona Luna, O.D.    Clinical Lists Changes  Observations: Added new observation of DIAB EYE EX: No diabetic retinopathy [DR] (07/10/2010 14:19)       Diabetic Eye Exam  Procedure date:  07/10/2010  Findings:      No diabetic retinopathy [DR]

## 2010-10-01 NOTE — Progress Notes (Signed)
Summary: ? How to use the OneTouch Ultra glucose machine  Phone Note Call from Patient Call back at [phone]   Caller: Daughter/Andrea Call For: [name] MD Summary of Call: Patien't daughter called and said that her mom just got a new glucose machine, OneTouch Ultra 2 and neither of them know how to operate it.  Wanted to know if she can come in and have the nurse teach her how to use the machine so she can in turn teach her mom.  Please advise.   Initial call taken by: Linde Gillis CMA Duncan Dull),  April 12, 2010 8:14 AM  Follow-up for Phone Call        I taught both the patient and the daughter that was accompanying her when I gave her the machine.  I will, however do it again if necessary.  The machine did not come with any lancets so I had to use our lancets but otherwise, everything was demonstrated. Daughter advised.  Delilah Shan CMA Duncan Dull)  April 12, 2010 11:16 AM  Sue Lush called on Friday afternoon 04/12/10 and said they were having problems with the lancets.  I tried to walk her through it on the phone but I'm not sure it was very effective.  She said she was going to try to get the pharmacist to help her with it before driving out here.  I told her to call back if she was still having problems and she agreed. Lugene Fuquay CMA (AAMA)  April 15, 2010 9:25 AM

## 2010-10-01 NOTE — Assessment & Plan Note (Signed)
Summary: TRANSFER FROM [place]   Vital Signs:  Patient profile:   75 year old female Height:      61 inches Weight:      119.25 pounds BMI:     22.61 Temp:     98.2 degrees F oral Pulse rate:   84 / minute Pulse rhythm:   regular BP sitting:   122 / 68  (left arm) Cuff size:   regular  Vitals Entered By: Delilah Shan CMA Duncan Dull) (April 08, 2010 11:44 AM) CC: Transfer from [place]   History of Present Illness: Diabetes:  Using medications without difficulties:yes  Hypoglycemic episodes: not checking sugars Hyperglycemic episodes:not checking sugars Feet problems: no Blood Sugars averaging: not checked eye exam within last year: no, but patient to check on this.   Hypertension:      Using medication without problems or lightheadedness: yes Chest pain with exertion:no Edema:no Short of breath:no Other issues: no  Multiple Myeloma- per [facility].  Pain is much improved.  Has follow up with renal pending.    Allergies: No Known Drug Allergies  Social History: Reviewed history from 03/29/2010 and no changes required. Marital Status:  Children:  Occupation: Retired Never Smoked Alcohol use-no Regular exercise-yes  Review of Systems       See HPI.  Otherwise negative.    Physical Exam  General:  GEN: nad, alert and oriented, smiling, not in pain HEENT: mucous membranes moist NECK: supple w/o LA CV: regular rate and rhythm  PULM: ctab, no inc wob ABD: soft, +bs EXT: no edema SKIN: no acute rash   Diabetes Management Exam:    Foot Exam (with socks and/or shoes not present):       Sensory-Pinprick/Light touch:          Left medial foot (L-4): normal          Left dorsal foot (L-5): normal          Left lateral foot (S-1): normal          Right medial foot (L-4): normal          Right dorsal foot (L-5): normal          Right lateral foot (S-1): normal       Sensory-Monofilament:          Left foot: normal          Right foot: normal       Inspection:          Left foot: normal          Right foot: normal       Nails:          Left foot: normal          Right foot: normal   Impression & Recommendations:  Problem # 1:  DIABETES MELLITUS, TYPE II (ICD-250.00) No change in meds.  Sent home with meter with instructions [id number] glucose.  On ACE.  contat wiht labs.  Her updated medication list for this problem includes:    Lisinopril-hydrochlorothiazide 20-12.5 Mg Tabs (Lisinopril-hydrochlorothiazide) ..... One tab by mouth once daily    Glipizide 2.5 Mg Xr24h-tab (Glipizide) .Marland Kitchen... Take 1 tablet by mouth once a day  Orders: TLB-BMP (Basic Metabolic Panel-BMET) (80048-METABOL) TLB-Hepatic/Liver Function Pnl (80076-HEPATIC) TLB-Lipid Panel (80061-LIPID) TLB-A1C / Hgb A1C (Glycohemoglobin) (83036-A1C)  Problem # 2:  MULTIPLE  MYELOMA (ICD-203.00) Pain improved and w/o fever.  No change in meds.   Problem # 3:  HYPERTENSION (ICD-401.9) controlled.  Check BMET.  Her updated medication list for this problem includes:    Lisinopril-hydrochlorothiazide 20-12.5 Mg Tabs (Lisinopril-hydrochlorothiazide) ..... One tab by mouth once daily  Complete Medication List: 1)  Lisinopril-hydrochlorothiazide 20-12.5 Mg Tabs (Lisinopril-hydrochlorothiazide) .... One tab by mouth once daily 2)  Advil 200 Mg Tabs (Ibuprofen) .... As needed 3)  Hydrocodone-acetaminophen 5-500 Mg Tabs (Hydrocodone-acetaminophen) .... One tablet every 6 hours as needed for break-through pain. 4)  Calcium Carbonate-vitamin D 600-400 Mg-unit Tabs (Calcium carbonate-vitamin d) .... Take 1 tablet by mouth three times a day 5)  Oxycontin 20 Mg Xr12h-tab (Oxycodone hcl) .... Take 1 tablet by mouth two times a day 6)  Warfarin Sodium 2 Mg Tabs (Warfarin sodium) .... Once daily as directed 7)  Warfarin Sodium 5 Mg Tabs (Warfarin sodium) .... Once daily as directed 8)  Alprazolam 0.5 Mg Tabs (Alprazolam) .... Take 1/2 tablet by mouth every morning and 1 tablet by mouth at bedtime as  needed 9)  Glipizide 2.5 Mg Xr24h-tab (Glipizide) .... Take 1 tablet by mouth once a day 10)  Klor-con 20 Meq Pack (Potassium chloride) .... Take 1 tablet by mouth once a day 11)  Prochlorperazine Maleate 10 Mg Tabs (Prochlorperazine maleate) .... Take 1 tablet every 6 hours as needed for nausea and vomiting 12)  Revlimid 25 Mg Caps (Lenalidomide) .... Take 1 capsule per day for 21 days every 28 days. 13)  Dexamethasone 4 Mg Tabs (Dexamethasone) .... Take 10 tablets by mouth once weekly with food as directed. 14)  Magnesium Oxide 400 Mg Tabs (Magnesium oxide) .... Take 1 tablet by mouth three times a day 15)  Onetouch Ultra Test Strp (Glucose blood) .... Test blood sugar once daily or as directed. 16)  Onetouch Ultrasoft Lancets Misc (Lancets) .... Test blood sugar once daily or as directed.  Patient Instructions: 1)  I want you to use the sugar meter if you feel weak or shaky.  If your sugar is less than 75, eat a snack.  If it is over 250 multiple times, let me know.  We'll contact you with your lab report.  Take care.   Prescriptions: ONETOUCH ULTRASOFT LANCETS  MISC (LANCETS) Test blood sugar once daily or as directed.  #100 x 0   Entered by:   Delilah Shan CMA (AAMA)   Authorized by:   [name] MD   Signed by:   Delilah Shan CMA (AAMA) on 04/08/2010   Method used:   Electronically to        [facility]* (retail)       [address] [place], [place]  [address]       Ph: [phone]       Fax: [phone]   RxID:   [id number] ONETOUCH ULTRA TEST  STRP (GLUCOSE BLOOD) Test blood sugar once daily or as directed.  #25 x 3   Entered by:   Delilah Shan CMA (AAMA)   Authorized by:   [name] MD   Signed by:   Delilah Shan CMA (AAMA) on 04/08/2010   Method used:   Electronically to        [facility]* (retail)       [address] [place], [place]  [address]       Ph: [phone]       Fax:  [phone]   RxID:   [id number] GLIPIZIDE  2.5 MG XR24H-TAB (GLIPIZIDE) Take 1 tablet by mouth once a day  #90 x 3   Entered and Authorized by:   [name] MD   Signed by:   [name] MD on 04/08/2010   Method used:   Electronically to        [facility]* (retail)       [address] [place], [place]  [address]       Ph: [phone]       Fax: [phone]   RxID:   [id number] LISINOPRIL-HYDROCHLOROTHIAZIDE 20-12.5 MG TABS (LISINOPRIL-HYDROCHLOROTHIAZIDE) one tab by mouth once daily  #90 x 3   Entered and Authorized by:   [name] MD   Signed by:   [name] MD on 04/08/2010   Method used:   Electronically to        [facility]* (retail)       [address] [place], [place]  [address]       Ph: [phone]       Fax: [phone]   RxID:   [id number]   Current Allergies (reviewed today): No known allergies

## 2010-10-01 NOTE — Progress Notes (Signed)
Summary: still having diarrhea  Phone Note Call from Patient   Caller: Daughter  [name]  [phone] Call For: [name] MD Summary of Call: Pt was seen last week for diarrhea.  This is still going on.  She is having 2-3 bouts a day, taking immodium.  Please advise on what to do next. Initial call taken by: Lowella Petties CMA, AAMA,  June 25, 2010 8:21 AM  Follow-up for Phone Call        I talked with daughter.  2 loose BMs yesterday.  Sunday 2 loose BMs.  Was using immodium.  She was doing better before this.  No fevers, no vomiting.  No other new symptoms.  At this point, I would cut the magnesium back to once daily and monitor symptoms.  Daughter agrees, call back as needed.  Follow-up by: [name] MD,  June 25, 2010 1:38 PM

## 2010-10-03 NOTE — Letter (Signed)
Summary: [facility]  [facility]   Imported By: Maryln Gottron 08/12/2010 13:10:45  _____________________________________________________________________  External Attachment:    Type:   Image     Comment:   External Document  Appended Document: [facility]    Clinical Lists Changes  Observations: Added new observation of PAST MED HX: BLOOD TRANSFUSION WITHOUT REPORTED DIAGNOSIS (ICD-V58.2) PHLEBITIS (ICD-451.9) Multiple myeloma per Dr. [name], palliative radiation for leg pain 2011 per Dr. Roselind Messier DEPRESSION, MILD (ICD-311) CHICKENPOX, HX OF (ICD-V15.9) HYPERLIPIDEMIA (ICD-272.4) HYPERTENSION (ICD-401.9) DEGENERATIVE JOINT DISEASE (ICD-715.90) Steroid related DM2 (08/12/2010 23:01)       Past History:  Past Medical History: BLOOD TRANSFUSION WITHOUT REPORTED DIAGNOSIS (ICD-V58.2) PHLEBITIS (ICD-451.9) Multiple myeloma per Dr. [name], palliative radiation for leg pain 2011 per Dr. Roselind Messier DEPRESSION, MILD (ICD-311) CHICKENPOX, HX OF (ICD-V15.9) HYPERLIPIDEMIA (ICD-272.4) HYPERTENSION (ICD-401.9) DEGENERATIVE JOINT DISEASE (ICD-715.90) Steroid related DM2

## 2010-10-03 NOTE — Letter (Signed)
Summary: [facility]  [facility]   Imported By: Maryln Gottron 09/05/2010 14:07:57  _____________________________________________________________________  External Attachment:    Type:   Image     Comment:   External Document

## 2010-10-03 NOTE — Letter (Signed)
Summary: [facility]  [facility]   Imported By: [name] 09/10/2010 11:08:32  _____________________________________________________________________  External Attachment:    Type:   Image     Comment:   External Document

## 2010-10-17 NOTE — Letter (Signed)
Summary: [facility]  [facility]   Imported By: Kassie Mends 10/09/2010 10:22:03  _____________________________________________________________________  External Attachment:    Type:   Image     Comment:   External Document  Appended Document: [facility]    Clinical Lists Changes  Observations: Added new observation of PAST MED HX: BLOOD TRANSFUSION WITHOUT REPORTED DIAGNOSIS (ICD-V58.2) PHLEBITIS (ICD-451.9) Multiple myeloma per Dr. [name], s/p palliative radiation for leg pain 2011 per Dr. Roselind Messier DEPRESSION, MILD (ICD-311) CHICKENPOX, HX OF (ICD-V15.9) HYPERLIPIDEMIA (ICD-272.4) HYPERTENSION (ICD-401.9) DEGENERATIVE JOINT DISEASE (ICD-715.90) Steroid related DM2 (10/09/2010 13:26)       Past History:  Past Medical History: BLOOD TRANSFUSION WITHOUT REPORTED DIAGNOSIS (ICD-V58.2) PHLEBITIS (ICD-451.9) Multiple myeloma per Dr. [name], s/p palliative radiation for leg pain 2011 per Dr. Roselind Messier DEPRESSION, MILD (ICD-311) CHICKENPOX, HX OF (ICD-V15.9) HYPERLIPIDEMIA (ICD-272.4) HYPERTENSION (ICD-401.9) DEGENERATIVE JOINT DISEASE (ICD-715.90) Steroid related DM2

## 2010-10-17 NOTE — Letter (Signed)
Summary: [facility]  [facility]   Imported By: Kassie Mends 10/09/2010 09:38:04  _____________________________________________________________________  External Attachment:    Type:   Image     Comment:   External Document

## 2010-10-28 ENCOUNTER — Encounter (HOSPITAL_BASED_OUTPATIENT_CLINIC_OR_DEPARTMENT_OTHER): Payer: Medicare Other | Admitting: Internal Medicine

## 2010-10-28 ENCOUNTER — Other Ambulatory Visit: Payer: Self-pay | Admitting: Internal Medicine

## 2010-10-28 ENCOUNTER — Encounter: Payer: Self-pay | Admitting: Family Medicine

## 2010-10-28 DIAGNOSIS — C9000 Multiple myeloma not having achieved remission: Secondary | ICD-10-CM

## 2010-10-28 DIAGNOSIS — Z7901 Long term (current) use of anticoagulants: Secondary | ICD-10-CM

## 2010-10-28 DIAGNOSIS — Z86718 Personal history of other venous thrombosis and embolism: Secondary | ICD-10-CM

## 2010-10-28 LAB — COMPREHENSIVE METABOLIC PANEL
ALT: 20 U/L (ref 0–35)
AST: 14 U/L (ref 0–37)
Calcium: 9.9 mg/dL (ref 8.4–10.5)
Chloride: 100 mEq/L (ref 96–112)
Creatinine, Ser: 1.11 mg/dL (ref 0.40–1.20)
Potassium: 3.5 mEq/L (ref 3.5–5.3)

## 2010-10-28 LAB — CBC WITH DIFFERENTIAL/PLATELET
BASO%: 0.4 % (ref 0.0–2.0)
EOS%: 8.9 % — ABNORMAL HIGH (ref 0.0–7.0)
MCH: 31.6 pg (ref 25.1–34.0)
MCHC: 33.2 g/dL (ref 31.5–36.0)
NEUT%: 62.7 % (ref 38.4–76.8)
RBC: 3.73 10*6/uL (ref 3.70–5.45)
RDW: 15.8 % — ABNORMAL HIGH (ref 11.2–14.5)
lymph#: 0.4 10*3/uL — ABNORMAL LOW (ref 0.9–3.3)

## 2010-10-28 LAB — PROTIME-INR: INR: 2.3 (ref 2.00–3.50)

## 2010-11-14 ENCOUNTER — Other Ambulatory Visit: Payer: Self-pay | Admitting: Internal Medicine

## 2010-11-14 ENCOUNTER — Encounter (HOSPITAL_BASED_OUTPATIENT_CLINIC_OR_DEPARTMENT_OTHER): Payer: Medicare Other | Admitting: Internal Medicine

## 2010-11-14 DIAGNOSIS — Z86718 Personal history of other venous thrombosis and embolism: Secondary | ICD-10-CM

## 2010-11-14 LAB — CBC WITH DIFFERENTIAL/PLATELET
Basophils Absolute: 0.1 10*3/uL (ref 0.0–0.1)
Eosinophils Absolute: 0.1 10*3/uL (ref 0.0–0.5)
HCT: 34.8 % (ref 34.8–46.6)
LYMPH%: 19.1 % (ref 14.0–49.7)
MCHC: 32.8 g/dL (ref 31.5–36.0)
MONO#: 0.5 10*3/uL (ref 0.1–0.9)
NEUT%: 64.8 % (ref 38.4–76.8)
Platelets: 289 10*3/uL (ref 145–400)
WBC: 3.8 10*3/uL — ABNORMAL LOW (ref 3.9–10.3)

## 2010-11-15 LAB — COMPREHENSIVE METABOLIC PANEL
BUN: 13 mg/dL (ref 6–23)
CO2: 30 mEq/L (ref 19–32)
Calcium: 9.5 mg/dL (ref 8.4–10.5)
Creatinine, Ser: 1.11 mg/dL (ref 0.40–1.20)
Glucose, Bld: 135 mg/dL — ABNORMAL HIGH (ref 70–99)
Total Bilirubin: 0.4 mg/dL (ref 0.3–1.2)

## 2010-11-15 LAB — LACTATE DEHYDROGENASE: LDH: 182 U/L (ref 94–250)

## 2010-11-15 LAB — IGG, IGA, IGM
IgG (Immunoglobin G), Serum: 583 mg/dL — ABNORMAL LOW (ref 694–1618)
IgM, Serum: 119 mg/dL (ref 60–263)

## 2010-11-15 LAB — BETA 2 MICROGLOBULIN, SERUM: Beta-2 Microglobulin: 2.06 mg/L — ABNORMAL HIGH (ref 1.01–1.73)

## 2010-11-21 ENCOUNTER — Other Ambulatory Visit: Payer: Self-pay | Admitting: Internal Medicine

## 2010-11-21 ENCOUNTER — Encounter (HOSPITAL_BASED_OUTPATIENT_CLINIC_OR_DEPARTMENT_OTHER): Payer: Medicare Other | Admitting: Internal Medicine

## 2010-11-21 DIAGNOSIS — Z7901 Long term (current) use of anticoagulants: Secondary | ICD-10-CM

## 2010-11-21 DIAGNOSIS — Z86718 Personal history of other venous thrombosis and embolism: Secondary | ICD-10-CM

## 2010-11-21 DIAGNOSIS — C9000 Multiple myeloma not having achieved remission: Secondary | ICD-10-CM

## 2010-11-21 LAB — PROTIME-INR
INR: 2.4 (ref 2.00–3.50)
Protime: 28.8 Seconds — ABNORMAL HIGH (ref 10.6–13.4)

## 2010-11-25 ENCOUNTER — Other Ambulatory Visit: Payer: Self-pay | Admitting: Family Medicine

## 2010-11-25 DIAGNOSIS — Z1231 Encounter for screening mammogram for malignant neoplasm of breast: Secondary | ICD-10-CM

## 2010-11-28 NOTE — Letter (Signed)
Summary: [facility]  [facility]   Imported By: Maryln Gottron 11/15/2010 12:50:05  _____________________________________________________________________  External Attachment:    Type:   Image     Comment:   External Document

## 2010-12-04 ENCOUNTER — Ambulatory Visit (HOSPITAL_COMMUNITY)
Admission: RE | Admit: 2010-12-04 | Discharge: 2010-12-04 | Disposition: A | Discharge status: Home / Self Care | Payer: Medicare Other | Source: Ambulatory Visit | Attending: Internal Medicine | Admitting: Internal Medicine

## 2010-12-04 ENCOUNTER — Other Ambulatory Visit: Payer: Self-pay | Admitting: Internal Medicine

## 2010-12-04 DIAGNOSIS — M545 Low back pain, unspecified: Secondary | ICD-10-CM | POA: Insufficient documentation

## 2010-12-04 DIAGNOSIS — C9000 Multiple myeloma not having achieved remission: Secondary | ICD-10-CM | POA: Insufficient documentation

## 2010-12-04 DIAGNOSIS — M25559 Pain in unspecified hip: Secondary | ICD-10-CM | POA: Insufficient documentation

## 2010-12-09 ENCOUNTER — Encounter: Payer: Self-pay | Admitting: Family Medicine

## 2010-12-16 ENCOUNTER — Ambulatory Visit: Payer: Medicare Other | Attending: Radiation Oncology | Admitting: Radiation Oncology

## 2010-12-16 DIAGNOSIS — Z510 Encounter for antineoplastic radiation therapy: Secondary | ICD-10-CM | POA: Insufficient documentation

## 2010-12-16 DIAGNOSIS — C9000 Multiple myeloma not having achieved remission: Secondary | ICD-10-CM | POA: Insufficient documentation

## 2010-12-16 DIAGNOSIS — M25569 Pain in unspecified knee: Secondary | ICD-10-CM | POA: Insufficient documentation

## 2010-12-16 DIAGNOSIS — M949 Disorder of cartilage, unspecified: Secondary | ICD-10-CM | POA: Insufficient documentation

## 2010-12-16 DIAGNOSIS — M899 Disorder of bone, unspecified: Secondary | ICD-10-CM | POA: Insufficient documentation

## 2010-12-18 ENCOUNTER — Other Ambulatory Visit: Payer: Self-pay | Admitting: Internal Medicine

## 2010-12-18 ENCOUNTER — Encounter (HOSPITAL_BASED_OUTPATIENT_CLINIC_OR_DEPARTMENT_OTHER): Payer: Medicare Other | Admitting: Internal Medicine

## 2010-12-18 DIAGNOSIS — C9000 Multiple myeloma not having achieved remission: Secondary | ICD-10-CM

## 2010-12-18 DIAGNOSIS — Z86718 Personal history of other venous thrombosis and embolism: Secondary | ICD-10-CM

## 2010-12-18 DIAGNOSIS — Z7901 Long term (current) use of anticoagulants: Secondary | ICD-10-CM

## 2010-12-18 DIAGNOSIS — I82409 Acute embolism and thrombosis of unspecified deep veins of unspecified lower extremity: Secondary | ICD-10-CM

## 2010-12-18 LAB — COMPREHENSIVE METABOLIC PANEL
ALT: 13 U/L (ref 0–35)
AST: 12 U/L (ref 0–37)
Alkaline Phosphatase: 41 U/L (ref 39–117)
Calcium: 9.4 mg/dL (ref 8.4–10.5)
Chloride: 105 mEq/L (ref 96–112)
Creatinine, Ser: 1.01 mg/dL (ref 0.40–1.20)
Total Bilirubin: 0.2 mg/dL — ABNORMAL LOW (ref 0.3–1.2)

## 2010-12-18 LAB — CBC WITH DIFFERENTIAL/PLATELET
Basophils Absolute: 0 10*3/uL (ref 0.0–0.1)
EOS%: 2.1 % (ref 0.0–7.0)
Eosinophils Absolute: 0.1 10*3/uL (ref 0.0–0.5)
HGB: 11.8 g/dL (ref 11.6–15.9)
MCH: 32 pg (ref 25.1–34.0)
NEUT#: 2.2 10*3/uL (ref 1.5–6.5)
RBC: 3.68 10*6/uL — ABNORMAL LOW (ref 3.70–5.45)
RDW: 16.7 % — ABNORMAL HIGH (ref 11.2–14.5)
lymph#: 0.5 10*3/uL — ABNORMAL LOW (ref 0.9–3.3)

## 2010-12-18 LAB — PROTIME-INR
INR: 2.6 (ref 2.00–3.50)
Protime: 31.2 Seconds — ABNORMAL HIGH (ref 10.6–13.4)

## 2010-12-19 ENCOUNTER — Ambulatory Visit
Admission: RE | Admit: 2010-12-19 | Discharge: 2010-12-19 | Disposition: A | Discharge status: Home / Self Care | Payer: Medicare Other | Source: Ambulatory Visit | Attending: Family Medicine | Admitting: Family Medicine

## 2010-12-19 DIAGNOSIS — Z1231 Encounter for screening mammogram for malignant neoplasm of breast: Secondary | ICD-10-CM

## 2010-12-20 ENCOUNTER — Encounter: Payer: Self-pay | Admitting: *Deleted

## 2011-01-14 NOTE — Discharge Summary (Signed)
[name], [name] NO.:  [id number]   MEDICAL RECORD NO.:  [id number]          PATIENT TYPE:  INP   LOCATION:  1307                         FACILITY:  [facility]   PHYSICIAN:  Ramiro Harvest, MD    DATE OF BIRTH:  09-21-1935   DATE OF ADMISSION:  06/09/2008  DATE OF DISCHARGE:  06/14/2008                               DISCHARGE SUMMARY   PRIMARY CARE PHYSICIAN:  Dr. Henrine Screws of [facility].   HEMATOLOGIST/ONCOLOGIST:  Dr. Si Gaul of Hematology/Oncology.   DISCHARGE DIAGNOSIS:  1. Right 7th rib fracture secondary a fall.  2. Dehydration.  3. Orthostasis.  4. Acute renal failure.  5. Pneumonia.  6. Acute sinusitis.  7. Generalized weakness.  8. Multiple myeloma.  9. Anemia of chronic disease.  10.Hypertension.  11.Hyperlipidemia.  12.Falls.  13.Hypokalemia.  14.Hypomagnesemia.  15.Hypocalcemia.  16.Anxiety.  17.Thrombocytopenia secondary to multiple myeloma.  18.Multiple myeloma diagnosed April 28, 2008 on chemotherapy and      radiation per Dr. Si Gaul.  19.Hyperlipidemia.  20.Chronic pain, mostly in the ribs, shoulders and back secondary to      multiple myeloma.  21.Chronic right knee pain.  22.Gastroesophageal reflux disease.  23.Status post hysterectomy.  24.Status post left lower extremity fracture in 2002.   DISCHARGE MEDICATIONS:  1. Lisinopril 20 mg p.o. daily  2. OxyContin 20 mg p.o. b.i.d.  3. Compazine 10 mg p.o. q.6 hours p.r.n. pain.  4. Restoril 50 mg p.o. nightly.  5. Endocet 10/650 one tablet p.o. q.4 hours p.r.n.  6. Pepcid Complete nightly.  7. Revlimid 25 mg p.o. daily.  8. Avelox 400 mg p.o. daily x2 days.  9. Megace 400 mg p.o. daily.  10.Ativan 0.5 mg p.o. b.i.d. p.r.n. anxiety.  11.Magnesium oxide 400 mg p.o. t.i.d.  12.Os-Cal plus D 1 tablet p.o. t.i.d.  13.Resource 240 mL p.o. b.i.d.   DISPOSITION AND FOLLOWUP:  The patient will be discharged home with home  health PT.  The patient is to  call to schedule a followup appointment  with her PCP in 1 week and follow up a basic metabolic profile needs to  be checked to follow up on the patient's electrolytes and magnesium  level also need to be checked to follow up on the patient's magnesium  level.  The patient is also to follow up with her oncologist as  previously scheduled.   CONSULTATIONS DONE:  Hematology/Oncology consult was done.  The patient  was seen in consultation by Dr. Darnelle Catalan of Hematology/Oncology on  June 09, 2008.   PROCEDURES PERFORMED:  1. Plain films of the hip were performed on June 09, 2008 that      showed diffuse lytic osseous process, likely multiple myeloma or      metastatic disease.  No acute bony findings.  2. Plain films of the right ribs were done on June 09, 2008 that      showed diffuse osseous metastatic disease versus myeloma right 7th      anterior rib fracture.  3. A chest x-ray was obtained on June 09, 2008 which showed no  definite infiltrate noted on the PA view.  On the lateral view      opacities noted in the inferior retrosternal region.  Previously      the possibility of this representing an infiltrate was raised,      although it is possible this may reflect confluence of shadows, but      will require followup as this appears slightly different from      April 28, 2008 exam.  Subtle right seventh rib fracture without      associated pneumothorax.  Moderate anterior wedge compression      deformity of the T7 vertebral body with mild to moderate wedge      compression deformity of the T5 vertebral body and mild loss of      height of the superior plate of the T9 vertebral, appearing      unchanged compared to June 06, 2008 chest x-ray exam but new when      compared to April 28, 2008 exam.  There is also a fracture of the      distal clavicle.  Given these findings, combined with CT findings      noted on April 28, 2008 examination, suggest that the patient  may      have underlying myeloma with superimposed compression deformities.  4. CT of the head and C-spine were done on June 09, 2008 which      showed no intracranial hemorrhage or skull fracture, left sphenoid      sinus opacification with air-fluid levels suggesting acute      sinusitis, question old tiny left thalamic infarct, diffuse      myelomatous involvement throughout the supracervical and upper      thoracic vertebrae with myeloma extending through the cortex of the      right T1 pedicle/transverse process region.  No post-traumatic      fracture noted.  Cervical spondylitic changes with spinal stenosis.      Follow up as discussed above.  5. Chest x-ray was obtained on June 10, 2008 that showed no acute      cardiopulmonary abnormality, stable diffuse bone disease.   BRIEF HOSPITAL HISTORY AND PHYSICAL:  [name] is a 75 year old  African American female recently diagnosed with multiple myeloma April 28, 2008, history of hypertension, hyperlipidemia, GERD, who was  recently diagnosed with pneumonia on June 06, 2008 per oncologist and  placed on Augmentin, who presented to the ED with generalized weakness  and a fall.  Per patient and family the patient was getting out of bed  around 3:30 a.m. to use the bathroom when she lost her balance on her  right side secondary to weakness.  The patient denied any syncope.  No  chest pain or shortness of breath, no nausea or vomiting or diarrhea.  No constipation or abdominal pain, no cough, no melena or hematemesis,  no hematochezia, no focal neurological symptoms.  However, the patient  was complaining of some right groin pain and has had dizziness for the  past 6 days, generalized weakness for the past 6 days, and decreased  oral intake per family and also upper respiratory symptoms for the past  6 days.  The patient presented to the ED.  In the ED the patient was  noted to have a seventh rib fracture, diffuse disease  of multiple  myeloma, CMET with a BUN/creatinine of 46/1.47.  Otherwise the rest of  the comprehensive metabolic profile was within normal limits.  CBC with  a white count of 5.6, hemoglobin of 11.3, platelets of 114,000,  hematocrit of 34.  Point of care cardiac markers were negative.  Coags  were within normal limits.  UA was negative for urinary tract infection.  Head CT was negative for acute CVA or hemorrhage.  We were called to  admit the patient for further evaluation and management.   PHYSICAL EXAM:  VITAL SIGNS:  Temperature 97.5, blood pressure 116/65,  pulse of 95, respiratory rate 18, satting 99% on room air.  GENERAL:  The patient was a cachectic-looking female in no apparent  distress.  HEENT:  Normocephalic, atraumatic.  Pupils equal, round and reactive to  light.  Extraocular movements intact.  Oropharynx is clear.  No lesions.  No exudates.  Dry mucous membranes.  NECK:  Supple.  No lymphadenopathy.  RESPIRATORY:  Lungs were clear to auscultation bilaterally.  No wheezes,  no rhonchi.  No crackles.  CARDIOVASCULAR:  Regular rate and rhythm.  No murmurs, rubs or gallops.  ABDOMEN:  Soft, nontender, nondistended, positive bowel sounds.  EXTREMITIES:  No clubbing, cyanosis or edema.  NEUROLOGICAL:  The patient was alert and oriented x3.  Cranial nerves II-  XII were grossly intact.  No focal deficits, 4/5 left lower extremity  strength 3/5 right lower extremity strength.   ADMISSION LABS:  CBC white count 5.6, hemoglobin 11.3, platelets  114,000, hematocrit 34, ANC of 4.7, sodium 135, potassium 3.7, chloride  108, bicarb 21, BUN 46, creatinine 1.47, glucose of 110, bilirubin 1.0,  alk phosphatase 83, AST 15, ALT 17, calcium 8.4, albumin 3.6, protein of  6.6.  Point of care cardiac markers CK-MB 3.0, troponin-I less than  0.05, myoglobin of 303, PTT 28.  PT 14.8, INR of 1.1.  UA was yellow  clear, specific gravity 1.014, pH of 6.5, glucose negative, bilirubin  negative,  ketones negative, blood negative protein negative,  urobilinogen 0.2, nitrite negative leukocytes trace.  Micro WBC 0-2.  Head CTs as stated above.  CT of the C-spine as stated above.  Chest x-  ray as stated above.  Rib films as stated above and hip films as stated  above.   HOSPITAL COURSE:  1. Right seventh rib fracture.  The patient was admitted with a right      seventh rib fracture felt to be secondary to a fall versus a      pathologic process secondary to a multiple myeloma.  The patient      was placed on supportive care, was given incentive spirometry, and      pain management.  Followup chest x-rays were obtained on hospital      day #2 which were negative for pneumothorax.  The patient was then      treated with supportive care and was discharged in stable and      improved condition.  2. Dehydration.  The patient was noted to be dehydrated.  Orthostatics      were checked.  The patient was orthostatic.  The patient was fluid      resuscitated with IV fluids and by day of discharge the patient was      euvolemic.  3. Acute renal failure.  The patient was brought in with acute renal      failure.  Baseline creatinine was 0.69 per BMET obtained on April 28, 2008. Was felt the patient's acute renal failure was secondary      to a prerenal azotemia.  [name] antihypertensives were held.      Foley catheter was placed.  Fractional excretion of sodium was also      obtained which was less than 1%.  The patient was placed on IV      fluids and the patient's renal function improved on a daily basis      such that by day of discharge the patient's acute renal failure had      resolved.  4. Pneumonia.  The patient had recently been started on Augmentin for      a pneumonia per oncologist.  The chest x-ray on admission as stated      above showed on the lateral view showed possible pneumonia.  The      patient was then placed on Avelox and was to complete a 7-day       course of Avelox.  5. Acute sinusitis as noted per head CT.  The patient was maintained      on Avelox and will complete a 7-day course of antibiotic treatment.  6. Anemia.  The patient did not have any overt signs of      gastrointestinal bleed.  [name] stools were guaiac and      anemia panel was obtained.  It was felt the patient's anemia was      secondary to anemia of chronic disease secondary to her multiple      myeloma.  7. Hypertension.  [name] blood pressure medications were held      secondary to dehydration.  The patient was discharged home on      lisinopril and her hydrochlorothiazide was held.  8. Hypocalcemia.  [name] hypocalcemia was felt to be secondary      to recently been treated with Zometa.  [name] Zometa was      discontinued throughout the hospitalization.  [name]      magnesium level was repleted and a calcium level was repleted.      These will need to be followed up as an outpatient per PCP.  9. Hypomagnesemia.  The patient was noted to have a hypomagnesemia.      During the hospitalization the patient's magnesium was repleted      with IV magnesium sulfate.  The patient was placed on magnesium      oxide orally.  This will need to be followed up as an outpatient.  10.Hypokalemia.  During the hospitalization the patient was noted to      be hypokalemic.  [name] potassium was repleted and it was      felt the patient's hypokalemia could be secondary to      hypomagnesemia.  [name] magnesium level was also repleted      during the hospitalization.  This will need to be followed up as      outpatient.  11.Thrombocytopenia.  Felt to be secondary to multiple myeloma.  12.Multiple myeloma.  During the hospitalization the patient was      maintained on her home dose of Revlimid and Decadron q. weekly.      Hematology/Oncology was consulted.  They followed the patient      throughout the hospitalization and the  patient's pain was managed      during the hospitalization.    The rest of the patient's chronic medical issues were stable throughout  the hospitalization and by day of discharge the patient was in stable  and improved condition.  On day of discharge vital signs  temperature  98.5, pulse of 78, respirations 17, blood pressure 129/67, satting 96%  on room air.   DISCHARGE LABS:  Magnesium level of 1.6.  Basic metabolic profile,  sodium 140, potassium 3.2, chloride 109, bicarb 21, glucose 97, BUN 6,  creatinine 0.81 and a calcium of 5.6.  [name] potassium was  repleted prior to discharge.  CBC on day of discharge white count 3,  hemoglobin 9.9, hematocrit 28.3 and a platelet count of 147,000.   It was a pleasure taking care of Ms. [name].      Ramiro Harvest, MD  Electronically Signed     DT/MEDQ  D:  07/23/2008  T:  07/23/2008  Job:  [id number]   cc:   Chales Salmon. Abigail Miyamoto, M.D.  Fax: [phone]   Lajuana Matte, MD  Fax: [phone]

## 2011-01-14 NOTE — H&P (Signed)
[name] NO.:  [id number]   MEDICAL RECORD NO.:  [id number]          PATIENT TYPE:  INP   LOCATION:  1307                         FACILITY:  [facility]   PHYSICIAN:  Ramiro Harvest, MD    DATE OF BIRTH:  August 11, 1936   DATE OF ADMISSION:  06/09/2008  DATE OF DISCHARGE:                              HISTORY & PHYSICAL   PRIMARY CARE PHYSICIAN:  Chales Salmon. Abigail Miyamoto, M.D. of [facility].   HEMATOLOGIST/ONCOLOGIST:  Lajuana Matte, MD.   HISTORY OF PRESENT ILLNESS:  [name] is a 75 year old African  American female recently diagnosed with multiple myeloma on April 28, 2008, history of hypertension, hyperlipidemia, GERD, who was recently  diagnosed with pneumonia on June 06, 2008 per her oncologist and  placed on Augmentin, who presents to the emergency department with  generalized weakness and a fall.  Per patient and family, the patient  was getting out of bed at 3:30 a.m. to use the bathroom when she lost  her balance and fell on the right side secondary to weakness.  The  patient denies any syncope.  No chest pain, no shortness of breath, no  nausea, no  vomiting, no diarrhea, no constipation, no abdominal pain,  no cough, no melena, no hematemesis, no hematochezia, no focal  neurological symptoms.  However, the patient is complaining of some  right groin pain.  Has had dizziness for the past 6 days.  Generalized  weakness for the past 6 days and decreased oral intake per family and  also upper respiratory symptoms for the past 6 days.  The patient  presented to the ED.  In the ED, the patient was noted to have a seventh  rib fracture, diffuse disease of multiple myeloma.  Comprehensive  metabolic profile with a BUN/creatinine of 46/1.47.  Otherwise, the rest  of the comprehensive metabolic profile was within normal limits.  CBC  with a white count 5.6, hemoglobin of 11.3, platelets of 114,  hematocrit of 34.  Point of care cardiac markers  were negative.  Coags  were within normal limits.  UA was negative for urinary tract infection.  Head CT was negative for an acute CVA or hemorrhage.  We were called to  admit the patient for further evaluation and recommendations.   ALLERGIES:  NO KNOWN DRUG ALLERGIES.   PAST MEDICAL HISTORY:  1. Multiple myeloma diagnosed April 28, 2008 on chemo and radiation      therapy per Dr. Si Gaul.  2. Hypertension.  3. Hyperlipidemia.  4. Chronic pain mostly in the ribs, shoulders and back likely      secondary to multiple myeloma.  5. Chronic right knee pain.  6. Gastroesophageal reflux disease.  7. Status post hysterectomy.  8. Status post left lower extremity fracture in 2002.   HOME MEDICATIONS:  1. Lisinopril/HCT 20/12.5 two tablets p.o. daily.  2. OxyContin 20 mg p.o. b.i.d.  3. Compazine 10 mg p.o. q.6 h. p.r.n.  4. Restoril 50 mg p.o. q.h.s. p.r.n.  5. Augmentin 875 p.o. b.i.d. started on June 06, 2008.  6. Pepcid Complete  10 mg p.o. daily p.r.n.  7. Revlimid 25 mg p.o. daily.  8. Endocet 10/650 one tablet p.o. q.4-6 h. p.r.n.  9. Zometa.   SOCIAL HISTORY:  The patient is divorced, lives in [place] with her  daughter and her son.  No tobacco use.  No alcohol use.  No IV drug use.  The patient has 7 children, 4 sons and 3 daughters all of whom are  healthy.  The patient is currently unemployed/retired.   FAMILY HISTORY:  Father deceased in his 37s from hypertension.  Mother  deceased in her 42s from diabetes and hypertension.  The patient has 7  siblings, 1 sister and 1 brother whom are deceased.  Brother deceased  from pancreatic cancer.  Sister deceased from multiple myeloma.  Has  another sister with abdominal cancer.  The patient has a total of 7  siblings.   REVIEW OF SYSTEMS:  As per HPI, otherwise negative.   PHYSICAL EXAMINATION:  VITAL SIGNS:  Temperature 97.5, blood pressure  116/65 pulse of 95, respiratory rate 18, satting 99% on room air.   GENERAL:  The patient is a thin, cachectic-looking female in no apparent  distress.  HEENT:  Normocephalic, atraumatic.  Pupils are equal, round  and reactive to light.  Extraocular movements intact.  Oropharynx is  clear.  No lesions, no exudates.  Dry mucous membranes.  NECK:  Supple.  No lymphadenopathy.  RESPIRATORY:  Lungs are clear to auscultation bilaterally.  No wheezes.  No rhonchi.  No crackles.  CARDIOVASCULAR:  Regular rate and rhythm.  No murmurs, rubs or gallops.  ABDOMEN:  Soft, nontender, nondistended.  Positive bowel sounds.  EXTREMITIES:  No clubbing, cyanosis or edema.  NEUROLOGIC:  The patient is alert and oriented x3.  Cranial nerves II-  XII are grossly intact.  No focal deficits, 4/5 left lower extremity  strength, 3/5 right lower extremity strength.   LABORATORY DATA:  CBC:  White count 5.6, hemoglobin 11.3, platelets 114,  hematocrit 34.0, ANC of 4.7, sodium 135, potassium 3.7, chloride 108,  bicarb 21, BUN 46, creatinine 1.47, glucose of 110, bilirubin 1.0, alk  phosphatase 83, AST 15, ALT 17, calcium of 8.4, albumin 3.6, protein of  6.6.  Point of care cardiac markers:  CK-MB 3.0, troponin-I less than  0.05, myoglobin 303, PTT of 28, PT of 14.8, INR of 1.1.  UA was yellow  clear, specific gravity 1.014, pH of 6.5, glucose negative, bilirubin  negative, ketones negative, blood negative, protein negative,  urobilinogen 0.2, nitrite negative, leukocytes trace micro, WBC 0-2.   DIAGNOSTICS:  1. CT of the head with no intracranial hemorrhage or skull fracture,      left sphenoid sinus opacification with air-fluid levels suggesting      acute sinusitis, question old tiny left thalamic infarct, scattered      calvarium lucencies consistent with myeloma.  2. CT of the C-spine shows diffuse myelomatous involvement throughout      the cervical and upper thoracic vertebrae with myeloma extending      through the cortex of the right T1 pedicle/transverse process       region.  No post-traumatic fracture noted.  Cervical spondylitic      changes with spinal stenosis, followup as stated above.  3. Chest x-ray with no definite infiltrate as noted on the PA view.      On the lateral view, opacities noted in the inferior retrosternal      region, previous possibility of this representing an infiltrate was  raised, although this is possible this may represent confluence of      shadows, but will require follow up as this appears slightly      different from that on April 28, 2008.  Subtle right seventh rib      fracture without associated pneumothorax, moderate anterior right      wedge compression deformity of the T7 vertebral body with mild-      moderate inferior wedge compression deformity of T5 vertebral body      and mild loss of height of the superior plate of the T9 vertebral,      appear unchanged compared to June 06, 2008 chest x-ray exam, but      new when compared to April 28, 2008.  There is also fracture of      the distal clavicle.  Given these findings, combined with a CT      findings noted on April 28, 2008 examination (multiple lytic      lesions) suggest that the patient may have underlying pneumonia      with superimposed compression deformities.  4. Two-view right rib films shows diffuse osseus metastatic disease      versus myeloma, right seventh anterior rib fracture.  5. Plain films of the hip showed diffuse lytic osseus process likely      multiple myeloma or metastatic disease.  No acute bony findings   ASSESSMENT/PLAN:  [name] is a 75 year old African American  female recently diagnosed with multiple myeloma and being treated for  presumed pneumonia, who presents to the emergency department with  generalized weakness and a fall with right-sided pain.  1. Right seventh rib fracture, likely secondary to a fall versus      pathological secondary to multiple myeloma.  Supportive care,      incentive spirometry, pain  management.  Follow up chest x-ray in      the morning.  2. Dehydration.  We will place her on IV fluids and monitor.  3. Weakness likely secondary to debility versus dehydration, versus      multiple myeloma, versus acute coronary syndrome, although unlikely      as the patient is asymptomatic with negative point of care cardiac      markers, versus metabolic in nature.  We will check orthostatics,      cycle cardiac enzymes q.8 h. x2.  Check an EKG.  Check a TSH.      Place her on IV fluids, PT/OT consult.  4. Acute renal failure.  Baseline creatinine of 0.69 on April 28, 2008, likely prerenal azotemia secondary to dehydration in the      setting of antihypertensive use, versus renal, versus post-renal.      We will check a fractional excretion of sodium.  Place her on IV      fluids.  If no improvement with IV fluids, we will check a renal      ultrasound.  We will also place a Foley catheter.  5. Pneumonia.  Being treated already on Augmentin.  We will check a      sputum gram stain and culture.  We will place her on Avelox and      follow.  6. Acute sinusitis.  We will place her on Avelox.  7. Multiple myeloma.  We will continue home regimen of Revlimid pain      management.  We will get hematology/oncology consult for further      evaluation and  management.  8. Anemia.  No overt signs of GI bleed.  We will guaiac stools and      check an anemia panel.  9. Hypertension.  We will hold blood pressure medications secondary to      problem number 2.  10.Hyperlipidemia.  11.Falls secondary to generalized weakness, PT/OT.  12.Prophylaxis:  Protonix for GI prophylaxis.  Lovenox for DVT      prophylaxis.   It has been a pleasure taking care of Ms. [name].      Ramiro Harvest, MD  Electronically Signed     DT/MEDQ  D:  06/09/2008  T:  06/10/2008  Job:  [id number]   cc:   Chales Salmon. Abigail Miyamoto, M.D.  Fax: [phone]   Lajuana Matte, MD  Fax: [phone]

## 2011-01-15 ENCOUNTER — Other Ambulatory Visit: Payer: Self-pay | Admitting: Internal Medicine

## 2011-01-15 ENCOUNTER — Encounter (HOSPITAL_BASED_OUTPATIENT_CLINIC_OR_DEPARTMENT_OTHER): Payer: Medicare Other | Admitting: Internal Medicine

## 2011-01-15 DIAGNOSIS — Z86718 Personal history of other venous thrombosis and embolism: Secondary | ICD-10-CM

## 2011-01-15 DIAGNOSIS — I82409 Acute embolism and thrombosis of unspecified deep veins of unspecified lower extremity: Secondary | ICD-10-CM

## 2011-01-15 DIAGNOSIS — C9000 Multiple myeloma not having achieved remission: Secondary | ICD-10-CM

## 2011-01-15 DIAGNOSIS — Z7901 Long term (current) use of anticoagulants: Secondary | ICD-10-CM

## 2011-01-15 LAB — CBC WITH DIFFERENTIAL/PLATELET
BASO%: 0.2 % (ref 0.0–2.0)
Eosinophils Absolute: 0 10*3/uL (ref 0.0–0.5)
HCT: 33.9 % — ABNORMAL LOW (ref 34.8–46.6)
LYMPH%: 11.8 % — ABNORMAL LOW (ref 14.0–49.7)
MCHC: 33.6 g/dL (ref 31.5–36.0)
MONO#: 0.2 10*3/uL (ref 0.1–0.9)
NEUT%: 81.3 % — ABNORMAL HIGH (ref 38.4–76.8)
Platelets: 190 10*3/uL (ref 145–400)
WBC: 3 10*3/uL — ABNORMAL LOW (ref 3.9–10.3)

## 2011-01-15 LAB — COMPREHENSIVE METABOLIC PANEL
ALT: 14 U/L (ref 0–35)
Albumin: 3.5 g/dL (ref 3.5–5.2)
CO2: 27 mEq/L (ref 19–32)
Calcium: 9.2 mg/dL (ref 8.4–10.5)
Chloride: 102 mEq/L (ref 96–112)
Creatinine, Ser: 1.27 mg/dL — ABNORMAL HIGH (ref 0.40–1.20)
Potassium: 3.7 mEq/L (ref 3.5–5.3)

## 2011-01-15 LAB — LACTATE DEHYDROGENASE: LDH: 168 U/L (ref 94–250)

## 2011-01-17 NOTE — Op Note (Signed)
[name], [name]                             ACCOUNT NO.:  [id number]   MEDICAL RECORD NO.:  [id number]                   PATIENT TYPE:  AMB   LOCATION:  ENDO                                 FACILITY:  [facility]   PHYSICIAN:  John C. Madilyn Fireman, M.D.                 DATE OF BIRTH:  [birth year]   DATE OF PROCEDURE:  12/04/2003  DATE OF DISCHARGE:                                 OPERATIVE REPORT   PROCEDURE:  Colonoscopy.   INDICATIONS FOR PROCEDURE:  Family history of colon cancer in a first-degree  relative.   DESCRIPTION OF PROCEDURE:  The patient was placed in the left lateral  decubitus position and placed on the pulse monitor with continuous low-flow  oxygen delivered by nasal cannula.  She was sedated with 75 mcg IV fentanyl  and 7 mg IV Versed.  The Olympus video colonoscope was inserted into the  rectum and advanced to the cecum, confirmed by transillumination of  McBurney's point and visualization of the ileocecal valve and appendiceal  orifice.  Prep was excellent.  The cecum, ascending, transverse, descending,  and sigmoid colon all appeared normal with no masses, polyps, diverticula,  or other mucosal abnormalities.  The rectum likewise appeared normal and  retroflexed view of the anus revealed no obvious internal hemorrhoids.  The  scope was then withdrawn and the patient returned to the recovery room in  stable condition.  She tolerated the procedure well and there were no  immediate complications.   IMPRESSION:  Normal colonoscopy.   PLAN:  Repeat study in five years.  _________.                                               John C. Madilyn Fireman, M.D.    [facility]/MEDQ  D:  12/04/2003  T:  12/04/2003  Job:  [id number]

## 2011-01-21 ENCOUNTER — Other Ambulatory Visit: Payer: Self-pay | Admitting: Internal Medicine

## 2011-01-21 ENCOUNTER — Encounter (HOSPITAL_BASED_OUTPATIENT_CLINIC_OR_DEPARTMENT_OTHER): Payer: Medicare Other | Admitting: Internal Medicine

## 2011-01-21 DIAGNOSIS — C9000 Multiple myeloma not having achieved remission: Secondary | ICD-10-CM

## 2011-01-21 LAB — BASIC METABOLIC PANEL
Calcium: 9.1 mg/dL (ref 8.4–10.5)
Chloride: 102 mEq/L (ref 96–112)
Creatinine, Ser: 0.85 mg/dL (ref 0.40–1.20)

## 2011-02-03 ENCOUNTER — Emergency Department (HOSPITAL_COMMUNITY)
Admission: EM | Admit: 2011-02-03 | Discharge: 2011-02-03 | Disposition: A | Discharge status: Home / Self Care | Payer: Medicare Other | Attending: Emergency Medicine | Admitting: Emergency Medicine

## 2011-02-03 ENCOUNTER — Emergency Department (HOSPITAL_COMMUNITY): Payer: Medicare Other

## 2011-02-03 DIAGNOSIS — M79609 Pain in unspecified limb: Secondary | ICD-10-CM | POA: Insufficient documentation

## 2011-02-03 DIAGNOSIS — M25559 Pain in unspecified hip: Secondary | ICD-10-CM | POA: Insufficient documentation

## 2011-02-03 DIAGNOSIS — E7800 Pure hypercholesterolemia, unspecified: Secondary | ICD-10-CM | POA: Insufficient documentation

## 2011-02-03 DIAGNOSIS — I10 Essential (primary) hypertension: Secondary | ICD-10-CM | POA: Insufficient documentation

## 2011-02-03 DIAGNOSIS — E119 Type 2 diabetes mellitus without complications: Secondary | ICD-10-CM | POA: Insufficient documentation

## 2011-02-03 DIAGNOSIS — C9000 Multiple myeloma not having achieved remission: Secondary | ICD-10-CM | POA: Insufficient documentation

## 2011-02-03 LAB — PROTIME-INR: Prothrombin Time: 24.3 seconds — ABNORMAL HIGH (ref 11.6–15.2)

## 2011-02-03 LAB — URINALYSIS, ROUTINE W REFLEX MICROSCOPIC
Bilirubin Urine: NEGATIVE
Glucose, UA: NEGATIVE mg/dL
Ketones, ur: NEGATIVE mg/dL
Protein, ur: NEGATIVE mg/dL
Urobilinogen, UA: 0.2 mg/dL (ref 0.0–1.0)

## 2011-02-03 LAB — CBC
HCT: 37.8 % (ref 36.0–46.0)
Hemoglobin: 12.3 g/dL (ref 12.0–15.0)
MCH: 31.1 pg (ref 26.0–34.0)
MCHC: 32.5 g/dL (ref 30.0–36.0)
MCV: 95.5 fL (ref 78.0–100.0)
RBC: 3.96 MIL/uL (ref 3.87–5.11)

## 2011-02-03 LAB — COMPREHENSIVE METABOLIC PANEL
AST: 18 U/L (ref 0–37)
CO2: 29 mEq/L (ref 19–32)
Calcium: 9.5 mg/dL (ref 8.4–10.5)
Chloride: 99 mEq/L (ref 96–112)
Creatinine, Ser: 1.1 mg/dL (ref 0.4–1.2)
GFR calc Af Amer: 59 mL/min — ABNORMAL LOW (ref >60)
GFR calc non Af Amer: 49 mL/min — ABNORMAL LOW (ref >60)
Glucose, Bld: 190 mg/dL — ABNORMAL HIGH (ref 70–99)
Total Bilirubin: 0.3 mg/dL (ref 0.3–1.2)

## 2011-02-03 LAB — DIFFERENTIAL
Lymphocytes Relative: 17 % (ref 12–46)
Lymphs Abs: 0.8 10*3/uL (ref 0.7–4.0)
Monocytes Absolute: 0.6 10*3/uL (ref 0.1–1.0)
Monocytes Relative: 13 % — ABNORMAL HIGH (ref 3–12)
Neutro Abs: 2.8 10*3/uL (ref 1.7–7.7)
Neutrophils Relative %: 64 % (ref 43–77)

## 2011-02-03 LAB — URINE MICROSCOPIC-ADD ON

## 2011-02-03 LAB — APTT: aPTT: 38 seconds — ABNORMAL HIGH (ref 24–37)

## 2011-02-04 ENCOUNTER — Ambulatory Visit: Payer: Medicare Other | Admitting: Family Medicine

## 2011-02-10 ENCOUNTER — Ambulatory Visit
Admission: RE | Admit: 2011-02-10 | Discharge: 2011-02-10 | Disposition: A | Discharge status: Home / Self Care | Payer: Medicare Other | Source: Ambulatory Visit | Attending: Radiation Oncology | Admitting: Radiation Oncology

## 2011-02-11 ENCOUNTER — Other Ambulatory Visit: Payer: Self-pay | Admitting: Internal Medicine

## 2011-02-11 ENCOUNTER — Encounter (HOSPITAL_BASED_OUTPATIENT_CLINIC_OR_DEPARTMENT_OTHER): Payer: Medicare Other | Admitting: Internal Medicine

## 2011-02-11 ENCOUNTER — Ambulatory Visit
Admission: RE | Admit: 2011-02-11 | Discharge: 2011-02-11 | Disposition: A | Discharge status: Home / Self Care | Payer: Medicare Other | Source: Ambulatory Visit | Attending: Radiation Oncology | Admitting: Radiation Oncology

## 2011-02-11 DIAGNOSIS — Z86718 Personal history of other venous thrombosis and embolism: Secondary | ICD-10-CM

## 2011-02-11 DIAGNOSIS — Z510 Encounter for antineoplastic radiation therapy: Secondary | ICD-10-CM | POA: Insufficient documentation

## 2011-02-11 DIAGNOSIS — I82409 Acute embolism and thrombosis of unspecified deep veins of unspecified lower extremity: Secondary | ICD-10-CM

## 2011-02-11 DIAGNOSIS — C9000 Multiple myeloma not having achieved remission: Secondary | ICD-10-CM | POA: Insufficient documentation

## 2011-02-11 DIAGNOSIS — Z7901 Long term (current) use of anticoagulants: Secondary | ICD-10-CM

## 2011-02-11 LAB — CBC WITH DIFFERENTIAL/PLATELET
Eosinophils Absolute: 0 10*3/uL (ref 0.0–0.5)
HCT: 33 % — ABNORMAL LOW (ref 34.8–46.6)
LYMPH%: 15.8 % (ref 14.0–49.7)
MCHC: 33.7 g/dL (ref 31.5–36.0)
MCV: 95.6 fL (ref 79.5–101.0)
MONO#: 0.3 10*3/uL (ref 0.1–0.9)
MONO%: 10.9 % (ref 0.0–14.0)
NEUT#: 2.1 10*3/uL (ref 1.5–6.5)
NEUT%: 73 % (ref 38.4–76.8)
Platelets: 221 10*3/uL (ref 145–400)
WBC: 2.9 10*3/uL — ABNORMAL LOW (ref 3.9–10.3)

## 2011-02-11 LAB — PROTIME-INR: Protime: 32.4 Seconds — ABNORMAL HIGH (ref 10.6–13.4)

## 2011-02-12 ENCOUNTER — Encounter (HOSPITAL_BASED_OUTPATIENT_CLINIC_OR_DEPARTMENT_OTHER): Payer: Medicare Other | Admitting: Internal Medicine

## 2011-02-12 ENCOUNTER — Other Ambulatory Visit: Payer: Self-pay | Admitting: Internal Medicine

## 2011-02-12 DIAGNOSIS — Z86718 Personal history of other venous thrombosis and embolism: Secondary | ICD-10-CM

## 2011-02-12 DIAGNOSIS — C9000 Multiple myeloma not having achieved remission: Secondary | ICD-10-CM

## 2011-02-12 DIAGNOSIS — Z8579 Personal history of other malignant neoplasms of lymphoid, hematopoietic and related tissues: Secondary | ICD-10-CM

## 2011-02-12 DIAGNOSIS — Z7901 Long term (current) use of anticoagulants: Secondary | ICD-10-CM

## 2011-02-12 LAB — LACTATE DEHYDROGENASE: LDH: 173 U/L (ref 94–250)

## 2011-02-12 LAB — COMPREHENSIVE METABOLIC PANEL
ALT: 17 U/L (ref 0–35)
CO2: 26 mEq/L (ref 19–32)
Calcium: 9.4 mg/dL (ref 8.4–10.5)
Chloride: 103 mEq/L (ref 96–112)
Glucose, Bld: 92 mg/dL (ref 70–99)
Sodium: 140 mEq/L (ref 135–145)
Total Protein: 5.8 g/dL — ABNORMAL LOW (ref 6.0–8.3)

## 2011-02-12 LAB — IGG, IGA, IGM
IgA: 162 mg/dL (ref 69–380)
IgG (Immunoglobin G), Serum: 559 mg/dL — ABNORMAL LOW (ref 690–1700)

## 2011-03-12 ENCOUNTER — Ambulatory Visit (HOSPITAL_COMMUNITY)
Admission: RE | Admit: 2011-03-12 | Discharge: 2011-03-12 | Disposition: A | Discharge status: Home / Self Care | Payer: Medicare Other | Source: Ambulatory Visit | Attending: Internal Medicine | Admitting: Internal Medicine

## 2011-03-12 DIAGNOSIS — M948X9 Other specified disorders of cartilage, unspecified sites: Secondary | ICD-10-CM | POA: Insufficient documentation

## 2011-03-12 DIAGNOSIS — C9000 Multiple myeloma not having achieved remission: Secondary | ICD-10-CM | POA: Insufficient documentation

## 2011-03-12 DIAGNOSIS — Z8579 Personal history of other malignant neoplasms of lymphoid, hematopoietic and related tissues: Secondary | ICD-10-CM

## 2011-03-12 DIAGNOSIS — S22009A Unspecified fracture of unspecified thoracic vertebra, initial encounter for closed fracture: Secondary | ICD-10-CM | POA: Insufficient documentation

## 2011-03-12 DIAGNOSIS — X58XXXA Exposure to other specified factors, initial encounter: Secondary | ICD-10-CM | POA: Insufficient documentation

## 2011-03-12 DIAGNOSIS — M47817 Spondylosis without myelopathy or radiculopathy, lumbosacral region: Secondary | ICD-10-CM | POA: Insufficient documentation

## 2011-03-19 ENCOUNTER — Other Ambulatory Visit: Payer: Self-pay | Admitting: Internal Medicine

## 2011-03-19 ENCOUNTER — Encounter (HOSPITAL_BASED_OUTPATIENT_CLINIC_OR_DEPARTMENT_OTHER): Payer: Medicare Other | Admitting: Internal Medicine

## 2011-03-19 DIAGNOSIS — Z7901 Long term (current) use of anticoagulants: Secondary | ICD-10-CM

## 2011-03-19 DIAGNOSIS — I82409 Acute embolism and thrombosis of unspecified deep veins of unspecified lower extremity: Secondary | ICD-10-CM

## 2011-03-19 DIAGNOSIS — C9000 Multiple myeloma not having achieved remission: Secondary | ICD-10-CM

## 2011-03-19 DIAGNOSIS — Z86718 Personal history of other venous thrombosis and embolism: Secondary | ICD-10-CM

## 2011-03-19 LAB — CBC WITH DIFFERENTIAL/PLATELET
Basophils Absolute: 0 10*3/uL (ref 0.0–0.1)
Eosinophils Absolute: 0.3 10*3/uL (ref 0.0–0.5)
HCT: 36.5 % (ref 34.8–46.6)
HGB: 12.1 g/dL (ref 11.6–15.9)
LYMPH%: 10 % — ABNORMAL LOW (ref 14.0–49.7)
MCV: 93.4 fL (ref 79.5–101.0)
MONO#: 0.7 10*3/uL (ref 0.1–0.9)
MONO%: 17.5 % — ABNORMAL HIGH (ref 0.0–14.0)
NEUT#: 2.7 10*3/uL (ref 1.5–6.5)
NEUT%: 64 % (ref 38.4–76.8)
Platelets: 200 10*3/uL (ref 145–400)
WBC: 4.2 10*3/uL (ref 3.9–10.3)
nRBC: 0 % (ref 0–0)

## 2011-03-19 LAB — COMPREHENSIVE METABOLIC PANEL
AST: 17 U/L (ref 0–37)
Alkaline Phosphatase: 37 U/L — ABNORMAL LOW (ref 39–117)
BUN: 13 mg/dL (ref 6–23)
Creatinine, Ser: 1.04 mg/dL (ref 0.50–1.10)
Total Bilirubin: 0.5 mg/dL (ref 0.3–1.2)

## 2011-03-19 LAB — PROTIME-INR

## 2011-03-24 ENCOUNTER — Ambulatory Visit
Admission: RE | Admit: 2011-03-24 | Discharge: 2011-03-24 | Disposition: A | Discharge status: Home / Self Care | Payer: Medicare Other | Source: Ambulatory Visit | Attending: Radiation Oncology | Admitting: Radiation Oncology

## 2011-04-09 ENCOUNTER — Ambulatory Visit
Admission: RE | Admit: 2011-04-09 | Discharge: 2011-04-09 | Disposition: A | Discharge status: Home / Self Care | Payer: Medicare Other | Source: Ambulatory Visit | Attending: Radiation Oncology | Admitting: Radiation Oncology

## 2011-04-21 ENCOUNTER — Other Ambulatory Visit: Payer: Self-pay | Admitting: Internal Medicine

## 2011-04-21 ENCOUNTER — Encounter (HOSPITAL_BASED_OUTPATIENT_CLINIC_OR_DEPARTMENT_OTHER): Payer: Medicare Other | Admitting: Internal Medicine

## 2011-04-21 DIAGNOSIS — Z86718 Personal history of other venous thrombosis and embolism: Secondary | ICD-10-CM

## 2011-04-21 DIAGNOSIS — Z7901 Long term (current) use of anticoagulants: Secondary | ICD-10-CM

## 2011-04-21 DIAGNOSIS — C9000 Multiple myeloma not having achieved remission: Secondary | ICD-10-CM

## 2011-04-21 DIAGNOSIS — I82409 Acute embolism and thrombosis of unspecified deep veins of unspecified lower extremity: Secondary | ICD-10-CM

## 2011-04-21 LAB — COMPREHENSIVE METABOLIC PANEL
ALT: 18 U/L (ref 0–35)
AST: 13 U/L (ref 0–37)
Albumin: 3.8 g/dL (ref 3.5–5.2)
BUN: 18 mg/dL (ref 6–23)
Calcium: 9.5 mg/dL (ref 8.4–10.5)
Chloride: 101 mEq/L (ref 96–112)
Potassium: 3.7 mEq/L (ref 3.5–5.3)
Total Protein: 6.4 g/dL (ref 6.0–8.3)

## 2011-04-21 LAB — CBC WITH DIFFERENTIAL/PLATELET
Basophils Absolute: 0 10*3/uL (ref 0.0–0.1)
EOS%: 9.2 % — ABNORMAL HIGH (ref 0.0–7.0)
HGB: 12 g/dL (ref 11.6–15.9)
MCH: 32.2 pg (ref 25.1–34.0)
NEUT#: 2.8 10*3/uL (ref 1.5–6.5)
RDW: 16.6 % — ABNORMAL HIGH (ref 11.2–14.5)
lymph#: 0.5 10*3/uL — ABNORMAL LOW (ref 0.9–3.3)

## 2011-04-21 LAB — PROTIME-INR: INR: 3.5 (ref 2.00–3.50)

## 2011-04-21 LAB — LACTATE DEHYDROGENASE: LDH: 188 U/L (ref 94–250)

## 2011-04-30 ENCOUNTER — Telehealth: Payer: Self-pay | Admitting: Family Medicine

## 2011-04-30 NOTE — Telephone Encounter (Signed)
.  left message to have patient return my call.  

## 2011-04-30 NOTE — Telephone Encounter (Signed)
Please call pt. I was called by Dr. Mitzi Hansen with rady/onc today.  Per report, pt had some chest pain but it resolved and she went home with family.  I'd like to see her if possible ( OV) tomorrow or Friday to talk about this and recheck her.  If profound inc in chest pain in the meantime, then proceed to ER or use the on call line.  Thanks.

## 2011-05-01 NOTE — Telephone Encounter (Signed)
Noted  

## 2011-05-01 NOTE — Telephone Encounter (Signed)
Advised pt's son.  Tried to make appt for pt for tomorrow but her son said his sister will need to call us back to schedule, since she is the one that will be bringing the patient.  Advised son that there are limited times available and it's important that pt be seen.  Also advised ER if chest pain worsens.  Son agreed.

## 2011-05-01 NOTE — Telephone Encounter (Signed)
I tried to phone the patient back this morning and again at 2:53 p.m.  The phone will not ring, there is a long period of silence and then a busy signal.

## 2011-05-03 ENCOUNTER — Emergency Department (HOSPITAL_COMMUNITY): Payer: Medicare Other

## 2011-05-03 ENCOUNTER — Emergency Department (HOSPITAL_COMMUNITY)
Admission: EM | Admit: 2011-05-03 | Discharge: 2011-05-04 | Disposition: A | Discharge status: Home / Self Care | Payer: Medicare Other | Attending: Emergency Medicine | Admitting: Emergency Medicine

## 2011-05-03 DIAGNOSIS — E785 Hyperlipidemia, unspecified: Secondary | ICD-10-CM | POA: Insufficient documentation

## 2011-05-03 DIAGNOSIS — N390 Urinary tract infection, site not specified: Secondary | ICD-10-CM | POA: Insufficient documentation

## 2011-05-03 DIAGNOSIS — R4182 Altered mental status, unspecified: Secondary | ICD-10-CM | POA: Insufficient documentation

## 2011-05-03 DIAGNOSIS — I10 Essential (primary) hypertension: Secondary | ICD-10-CM | POA: Insufficient documentation

## 2011-05-03 DIAGNOSIS — Z87898 Personal history of other specified conditions: Secondary | ICD-10-CM | POA: Insufficient documentation

## 2011-05-03 DIAGNOSIS — E789 Disorder of lipoprotein metabolism, unspecified: Secondary | ICD-10-CM | POA: Insufficient documentation

## 2011-05-03 DIAGNOSIS — E119 Type 2 diabetes mellitus without complications: Secondary | ICD-10-CM | POA: Insufficient documentation

## 2011-05-03 DIAGNOSIS — Z79899 Other long term (current) drug therapy: Secondary | ICD-10-CM | POA: Insufficient documentation

## 2011-05-04 LAB — POCT I-STAT, CHEM 8
BUN: 31 mg/dL — ABNORMAL HIGH (ref 6–23)
Calcium, Ion: 1.2 mmol/L (ref 1.12–1.32)
Chloride: 103 mEq/L (ref 96–112)
Creatinine, Ser: 1.6 mg/dL — ABNORMAL HIGH (ref 0.50–1.10)
Glucose, Bld: 138 mg/dL — ABNORMAL HIGH (ref 70–99)
TCO2: 23 mmol/L (ref 0–100)

## 2011-05-04 LAB — DIFFERENTIAL
Basophils Absolute: 0.1 10*3/uL (ref 0.0–0.1)
Basophils Relative: 1 % (ref 0–1)
Eosinophils Absolute: 0.1 10*3/uL (ref 0.0–0.7)
Eosinophils Relative: 1 % (ref 0–5)
Monocytes Absolute: 1.2 10*3/uL — ABNORMAL HIGH (ref 0.1–1.0)

## 2011-05-04 LAB — URINALYSIS, ROUTINE W REFLEX MICROSCOPIC
Bilirubin Urine: NEGATIVE
Ketones, ur: NEGATIVE mg/dL
Nitrite: POSITIVE — AB
Protein, ur: NEGATIVE mg/dL
Urobilinogen, UA: 0.2 mg/dL (ref 0.0–1.0)
pH: 6 (ref 5.0–8.0)

## 2011-05-04 LAB — PROTIME-INR
INR: 3.42 — ABNORMAL HIGH (ref 0.00–1.49)
Prothrombin Time: 35 seconds — ABNORMAL HIGH (ref 11.6–15.2)

## 2011-05-04 LAB — CBC
MCHC: 32.9 g/dL (ref 30.0–36.0)
Platelets: 278 10*3/uL (ref 150–400)
RDW: 15.4 % (ref 11.5–15.5)

## 2011-05-04 LAB — URINE MICROSCOPIC-ADD ON

## 2011-05-06 ENCOUNTER — Telehealth: Payer: Self-pay | Admitting: *Deleted

## 2011-05-06 LAB — URINE CULTURE
Colony Count: >=100000
Culture  Setup Time: 201209021056

## 2011-05-06 NOTE — Telephone Encounter (Signed)
She is on the right antibiotic based on the culture (the results just came back).  I had seen the ER notes.  This should gradually improve, but I want her to f/u if the symptoms continue later the week (or if she gets worse).  Symptoms from a UTI, especially the mental changes, can take a few days to resolve.  If the urinary frequency is improved, this is a good sign.  Make sure she stays well hydrated. Thanks.

## 2011-05-06 NOTE — Telephone Encounter (Signed)
Daughter and patient advised. 

## 2011-05-06 NOTE — Telephone Encounter (Signed)
Patient had to go to [facility] ER Saturday with a UTI. Daughter called to let you know that she is still disoriented, but is not going to the bathroom as often. Sue Lush would like to know if her medication needs to be changed or what you recommend that she do? The hospital did a culture, but they have not heard anything from them about it. Please advise.

## 2011-05-08 ENCOUNTER — Ambulatory Visit (INDEPENDENT_AMBULATORY_CARE_PROVIDER_SITE_OTHER): Payer: Medicare Other | Admitting: Family Medicine

## 2011-05-08 ENCOUNTER — Encounter: Payer: Self-pay | Admitting: Family Medicine

## 2011-05-08 VITALS — BP 88/50 | HR 80 | Temp 98.1°F | Wt 122.0 lb

## 2011-05-08 DIAGNOSIS — R269 Unspecified abnormalities of gait and mobility: Secondary | ICD-10-CM

## 2011-05-08 DIAGNOSIS — I10 Essential (primary) hypertension: Secondary | ICD-10-CM

## 2011-05-08 DIAGNOSIS — R300 Dysuria: Secondary | ICD-10-CM

## 2011-05-08 DIAGNOSIS — R2681 Unsteadiness on feet: Secondary | ICD-10-CM

## 2011-05-08 DIAGNOSIS — N390 Urinary tract infection, site not specified: Secondary | ICD-10-CM | POA: Insufficient documentation

## 2011-05-08 LAB — POCT URINALYSIS DIPSTICK
Bilirubin, UA: NEGATIVE
Ketones, UA: NEGATIVE
Leukocytes, UA: NEGATIVE
pH, UA: 6

## 2011-05-08 MED ORDER — LISINOPRIL-HYDROCHLOROTHIAZIDE 20-12.5 MG PO TABS: 1.0000 | ORAL_TABLET | Freq: Every day | ORAL | Status: DC

## 2011-05-08 NOTE — Patient Instructions (Addendum)
Your urine looked better today.  I would finish the antibiotics.  Let me know if you have more troubles- especially burning with urination. See if the rx will help get a wheelchair.  I would stop the blood pressure medicine for now (lisinopril-HCTZ).  If BP is above 150/90, then start back on 1/2 tab a day.  Try to drink plenty of fluids in the meantime.   Call back with an update on BP and status (activty, drinking, etc) next week.

## 2011-05-08 NOTE — Assessment & Plan Note (Signed)
She is a fall risk and unable to walk distance due to weakness, likely compounded by recent/chronic illnesses.  I don't think she is ready for PT yet.  I've asked her to walk as much as possible at home and use the wheelchair (rx written) only for distance.  Family agrees.  They'll call back with update as needed.  >25 min spent with face to face with patient, >50% counseling.

## 2011-05-08 NOTE — Assessment & Plan Note (Signed)
Now with low BP.  Stop BP meds.  Likely due to relative dec in PO liquid intake.  Family to monitor BP and restart at 1/2 tab if BP rises.

## 2011-05-08 NOTE — Progress Notes (Signed)
Recently event d/w pt.   She had an episode of mid chest discomfort at rady/onc clinic.  It self resolved w/o further sx.  It was nonexertional, she wasn't sob, and it didn't radiate into the arms.  She has had some indigestion and is on prednisone.  No vomiting.   ER recs reviewed.  Recently in ER after syncope.  She remembers events before the fall.  Seen at ER and dx'd with UTI.  She felt well enough to go home, started on keflex and tolerated well.  Ucx with keflex sensitivity.  No dysuria now.  Dec in PO intake in meantime.  Her mental status is returning to baseline, but not back fully.  Still at home with family.   Fall risk as above.  She is deconditioned and unable to walk long distances.  She is weaker now, globally, and having trouble getting around with her walker if extensive distance needs to be covered.  Family asking about options.    Her pain is controlled and no recent fevers.  Sugar as been controlled.   PMH and SH reviewed  ROS: See HPI, otherwise noncontributory.  Meds, vitals, and allergies reviewed.   nad Speech is slightly slower than baseline prev but intelligible Oriented to season but not year Oriented to person and place Distant recall intact, knows POTUS (and that he is giving a speech tonight on TV) Mmm rrr ctab abd soft, not ttp  Ext without edema In wheelchair

## 2011-05-08 NOTE — Assessment & Plan Note (Signed)
She was on appropriate abx and appears to be returning to baseline slowly.  I would finish the keflex today and then notify us if dysuria or other alarming sx.  D/w family.  UTI along with lower BP, see below, may be affecting her current functional status.

## 2011-05-09 ENCOUNTER — Other Ambulatory Visit: Payer: Self-pay | Admitting: Family Medicine

## 2011-05-15 ENCOUNTER — Other Ambulatory Visit: Payer: Self-pay | Admitting: Internal Medicine

## 2011-05-15 ENCOUNTER — Encounter (HOSPITAL_BASED_OUTPATIENT_CLINIC_OR_DEPARTMENT_OTHER): Payer: Medicare Other | Admitting: Internal Medicine

## 2011-05-15 DIAGNOSIS — C9000 Multiple myeloma not having achieved remission: Secondary | ICD-10-CM

## 2011-05-15 LAB — BASIC METABOLIC PANEL
CO2: 27 mEq/L (ref 19–32)
Chloride: 106 mEq/L (ref 96–112)
Creatinine, Ser: 1.1 mg/dL (ref 0.50–1.10)
Sodium: 140 mEq/L (ref 135–145)

## 2011-05-16 ENCOUNTER — Emergency Department (HOSPITAL_COMMUNITY): Payer: Medicare Other

## 2011-05-16 ENCOUNTER — Observation Stay (HOSPITAL_COMMUNITY)
Admission: EM | Admit: 2011-05-16 | Discharge: 2011-05-17 | Disposition: A | Discharge status: Home / Self Care | Payer: Medicare Other | Attending: Internal Medicine | Admitting: Internal Medicine

## 2011-05-16 DIAGNOSIS — Z9181 History of falling: Secondary | ICD-10-CM | POA: Insufficient documentation

## 2011-05-16 DIAGNOSIS — Z86718 Personal history of other venous thrombosis and embolism: Secondary | ICD-10-CM | POA: Insufficient documentation

## 2011-05-16 DIAGNOSIS — F411 Generalized anxiety disorder: Secondary | ICD-10-CM | POA: Insufficient documentation

## 2011-05-16 DIAGNOSIS — E119 Type 2 diabetes mellitus without complications: Secondary | ICD-10-CM | POA: Insufficient documentation

## 2011-05-16 DIAGNOSIS — K219 Gastro-esophageal reflux disease without esophagitis: Secondary | ICD-10-CM | POA: Insufficient documentation

## 2011-05-16 DIAGNOSIS — C9000 Multiple myeloma not having achieved remission: Secondary | ICD-10-CM | POA: Insufficient documentation

## 2011-05-16 DIAGNOSIS — G8929 Other chronic pain: Secondary | ICD-10-CM | POA: Insufficient documentation

## 2011-05-16 DIAGNOSIS — G92 Toxic encephalopathy: Secondary | ICD-10-CM | POA: Insufficient documentation

## 2011-05-16 DIAGNOSIS — Z23 Encounter for immunization: Secondary | ICD-10-CM | POA: Insufficient documentation

## 2011-05-16 DIAGNOSIS — D649 Anemia, unspecified: Secondary | ICD-10-CM | POA: Insufficient documentation

## 2011-05-16 DIAGNOSIS — T40605A Adverse effect of unspecified narcotics, initial encounter: Secondary | ICD-10-CM | POA: Insufficient documentation

## 2011-05-16 DIAGNOSIS — Z79899 Other long term (current) drug therapy: Secondary | ICD-10-CM | POA: Insufficient documentation

## 2011-05-16 DIAGNOSIS — G929 Unspecified toxic encephalopathy: Principal | ICD-10-CM | POA: Insufficient documentation

## 2011-05-16 DIAGNOSIS — H1045 Other chronic allergic conjunctivitis: Secondary | ICD-10-CM | POA: Insufficient documentation

## 2011-05-16 DIAGNOSIS — E785 Hyperlipidemia, unspecified: Secondary | ICD-10-CM | POA: Insufficient documentation

## 2011-05-16 DIAGNOSIS — Z7901 Long term (current) use of anticoagulants: Secondary | ICD-10-CM | POA: Insufficient documentation

## 2011-05-16 DIAGNOSIS — J3489 Other specified disorders of nose and nasal sinuses: Secondary | ICD-10-CM | POA: Insufficient documentation

## 2011-05-16 DIAGNOSIS — I10 Essential (primary) hypertension: Secondary | ICD-10-CM | POA: Insufficient documentation

## 2011-05-16 DIAGNOSIS — D696 Thrombocytopenia, unspecified: Secondary | ICD-10-CM | POA: Insufficient documentation

## 2011-05-16 LAB — COMPREHENSIVE METABOLIC PANEL
ALT: 18 U/L (ref 0–35)
Calcium: 9.3 mg/dL (ref 8.4–10.5)
GFR calc Af Amer: 60 mL/min — ABNORMAL LOW (ref >60)
Glucose, Bld: 102 mg/dL — ABNORMAL HIGH (ref 70–99)
Sodium: 140 mEq/L (ref 135–145)
Total Protein: 6.3 g/dL (ref 6.0–8.3)

## 2011-05-16 LAB — CBC
HCT: 33.4 % — ABNORMAL LOW (ref 36.0–46.0)
MCV: 94.4 fL (ref 78.0–100.0)
RBC: 3.54 MIL/uL — ABNORMAL LOW (ref 3.87–5.11)
WBC: 4.1 10*3/uL (ref 4.0–10.5)

## 2011-05-16 LAB — URINE MICROSCOPIC-ADD ON

## 2011-05-16 LAB — URINALYSIS, ROUTINE W REFLEX MICROSCOPIC
Bilirubin Urine: NEGATIVE
Nitrite: NEGATIVE
Specific Gravity, Urine: 1.012 (ref 1.005–1.030)
pH: 7 (ref 5.0–8.0)

## 2011-05-16 LAB — LACTIC ACID, PLASMA: Lactic Acid, Venous: 0.7 mmol/L (ref 0.5–2.2)

## 2011-05-16 LAB — DIFFERENTIAL
Lymphocytes Relative: 13 % (ref 12–46)
Lymphs Abs: 0.5 10*3/uL — ABNORMAL LOW (ref 0.7–4.0)
Neutrophils Relative %: 62 % (ref 43–77)

## 2011-05-16 LAB — GLUCOSE, CAPILLARY

## 2011-05-16 LAB — PROTIME-INR
INR: 2.96 — ABNORMAL HIGH (ref 0.00–1.49)
Prothrombin Time: 31.3 seconds — ABNORMAL HIGH (ref 11.6–15.2)

## 2011-05-17 LAB — PROTIME-INR
INR: 3.1 — ABNORMAL HIGH (ref 0.00–1.49)
Prothrombin Time: 32.4 seconds — ABNORMAL HIGH (ref 11.6–15.2)

## 2011-05-17 LAB — CBC
Hemoglobin: 9.2 g/dL — ABNORMAL LOW (ref 12.0–15.0)
MCHC: 32.3 g/dL (ref 30.0–36.0)
Platelets: 137 10*3/uL — ABNORMAL LOW (ref 150–400)
RDW: 15.1 % (ref 11.5–15.5)

## 2011-05-18 LAB — URINE CULTURE

## 2011-05-19 ENCOUNTER — Other Ambulatory Visit: Payer: Self-pay | Admitting: Internal Medicine

## 2011-05-19 ENCOUNTER — Encounter (HOSPITAL_BASED_OUTPATIENT_CLINIC_OR_DEPARTMENT_OTHER): Payer: Medicare Other | Admitting: Internal Medicine

## 2011-05-19 ENCOUNTER — Telehealth: Payer: Self-pay | Admitting: *Deleted

## 2011-05-19 DIAGNOSIS — C9000 Multiple myeloma not having achieved remission: Secondary | ICD-10-CM

## 2011-05-19 DIAGNOSIS — Z86718 Personal history of other venous thrombosis and embolism: Secondary | ICD-10-CM

## 2011-05-19 DIAGNOSIS — Z7901 Long term (current) use of anticoagulants: Secondary | ICD-10-CM

## 2011-05-19 DIAGNOSIS — D649 Anemia, unspecified: Secondary | ICD-10-CM

## 2011-05-19 LAB — CBC WITH DIFFERENTIAL/PLATELET
Basophils Absolute: 0 10*3/uL (ref 0.0–0.1)
Eosinophils Absolute: 0.3 10*3/uL (ref 0.0–0.5)
HCT: 28 % — ABNORMAL LOW (ref 34.8–46.6)
HGB: 9.4 g/dL — ABNORMAL LOW (ref 11.6–15.9)
LYMPH%: 12.7 % — ABNORMAL LOW (ref 14.0–49.7)
MCV: 95.3 fL (ref 79.5–101.0)
MONO#: 0.6 10*3/uL (ref 0.1–0.9)
MONO%: 14.9 % — ABNORMAL HIGH (ref 0.0–14.0)
NEUT#: 2.8 10*3/uL (ref 1.5–6.5)
Platelets: 135 10*3/uL — ABNORMAL LOW (ref 145–400)
WBC: 4.2 10*3/uL (ref 3.9–10.3)

## 2011-05-19 NOTE — Telephone Encounter (Signed)
Pt's daughter called to let you know that  Pt went to [facility] ER on Friday with disorientation and was kept overnight for observation.  Her notes are in her chart.

## 2011-05-20 LAB — COMPREHENSIVE METABOLIC PANEL
AST: 25 U/L (ref 0–37)
Albumin: 3.4 g/dL — ABNORMAL LOW (ref 3.5–5.2)
BUN: 16 mg/dL (ref 6–23)
Calcium: 9 mg/dL (ref 8.4–10.5)
Chloride: 111 mEq/L (ref 96–112)
Glucose, Bld: 81 mg/dL (ref 70–99)
Potassium: 3.5 mEq/L (ref 3.5–5.3)
Sodium: 143 mEq/L (ref 135–145)
Total Protein: 5.5 g/dL — ABNORMAL LOW (ref 6.0–8.3)

## 2011-05-20 LAB — IGG, IGA, IGM
IgA: 153 mg/dL (ref 69–380)
IgG (Immunoglobin G), Serum: 453 mg/dL — ABNORMAL LOW (ref 690–1700)

## 2011-05-20 LAB — KAPPA/LAMBDA LIGHT CHAINS: Kappa:Lambda Ratio: 0.4 (ref 0.26–1.65)

## 2011-05-21 ENCOUNTER — Encounter (HOSPITAL_BASED_OUTPATIENT_CLINIC_OR_DEPARTMENT_OTHER): Payer: Medicare Other | Admitting: Internal Medicine

## 2011-05-21 DIAGNOSIS — Z7901 Long term (current) use of anticoagulants: Secondary | ICD-10-CM

## 2011-05-21 DIAGNOSIS — G893 Neoplasm related pain (acute) (chronic): Secondary | ICD-10-CM

## 2011-05-21 DIAGNOSIS — C9000 Multiple myeloma not having achieved remission: Secondary | ICD-10-CM

## 2011-05-21 DIAGNOSIS — Z86718 Personal history of other venous thrombosis and embolism: Secondary | ICD-10-CM

## 2011-05-22 LAB — CULTURE, BLOOD (ROUTINE X 2)
Culture  Setup Time: 201209141611
Culture  Setup Time: 201209141611
Culture: NO GROWTH

## 2011-05-23 ENCOUNTER — Other Ambulatory Visit: Payer: Self-pay | Admitting: Family Medicine

## 2011-05-23 NOTE — Telephone Encounter (Signed)
I saw the ER note.  Please call and get an update.  Thanks.

## 2011-05-26 NOTE — Telephone Encounter (Signed)
Spoke to patient's daughter Derwood Kaplan) and was informed that she is doing much better. They found out that the reason her BS has been high is because she has been eating candy during the night. The family took the items from her and her BS readings have been much better and BS this morning was 99. Patient has a follow-up appointment scheduled with you 06/06/11 per Derwood Kaplan.

## 2011-05-26 NOTE — H&P (Signed)
NAMECASSIDEY, [name] NO.:  [id number]  MEDICAL RECORD NO.:  [id number]  LOCATION:  WLED                         FACILITY:  [facility]  PHYSICIAN:  Jonny Ruiz, MD    DATE OF BIRTH:  04/18/1936  DATE OF ADMISSION:  05/16/2011 DATE OF DISCHARGE:                             HISTORY & PHYSICAL   CHIEF COMPLAINT:  Change in mental status.  HISTORY OF PRESENT ILLNESS:  The patient is a 75 year old female who was brought by the EMS to [facility] Emergency Department because of change in mental status.  The patient was seen at 11:00 a.m. by one of her daughters, who found her standing on the floor holding her walker and steering at this ceiling, unresponsive to verbal stimuli.  After observing her unresponsive for about 10 minutes, the patient contacted EMS Department.  The patient was seen by EMS and was noted to be lethargic and was given Narcan with good response.  Subsequently, she was brought to the emergency department.  Daughter reports that she was seen by her primary care physician yesterday and she was doing fine. Only change was that her dose of OxyContin was decreased from every 8 hours to every 12 hours and she was given Tylenol PM because of difficulty sleeping.  The patient is currently back to her baseline, the daughters state.  There are five daughters in the room with her.  She is alert and she is responsive to me.  She denies any pain at this moment. She knows that she is at [facility] and knows the date.  She reports no headaches or dizziness.  Denies chest pain, shortness of breath, or palpitations.  Denies fever, chills, or sweats.  The patient denies cough or sputum production.  Denies dysuria, frequency, or hematuria.  PAST MEDICAL HISTORY:  Significant for: 1. Multiple myeloma. 2. Anemia. 3. Hypertension. 4. Hyperlipidemia. 5. Frequent falls. 6. Anxiety disorder. 7. Thrombocytopenia. 8. Chronic pain. 9. Gastroesophageal reflux  disease.  SURGICAL HISTORY: 1. Status post hysterectomy. 2. Left lower extremity fracture in 2002.  PRIMARY CARE PHYSICIAN:  [name]. [name], M.D. from [facility].  HEMATOLOGY/ONCOLOGY:  [name]. [name], M.D. of Hematology/Oncology.  CURRENT MEDICATIONS: 1. Revlimid 25 mg daily.  The patient takes 21 days on and 7 days off,     started 21-day of her treatment on August 31. 2. Calcium carbonate 600/vitamin D 1 tablet three times a day. 3. Oxycodone CR 30 mg q.12. 4. Glipizide XL 2.5 mg a day. 5. Klor-Con 20 mEq a day. 6. Magnesium oxide 400 mg a day. 7. Lisinopril 20 mg a day. 8. Dexamethasone 4 mg weekly. 9. Alprazolam 0.5 mg q.12. 10.Warfarin 5 mg a day (indication for warfarin not found on medical     records). 11.Oxycodone 5 mg q.6 p.r.n. 12.Chemotherapy regimen followed by Dr. [name].  ALLERGIES:  No known drug allergies.  FAMILY HISTORY:  Not pertinent.  SOCIAL HISTORY:  The patient lives with one daughter and son-in-law. She has four more, total of five daughters.  Good social support.  The patient is a full code.  No tobacco, alcohol, or illicits.  REVIEW OF SYSTEMS:  See HPI.  PHYSICAL  EXAMINATION:  VITA SIGNS:  Blood pressure 174/78, pulse 69, respirations 23, temperature 97.6, and O2 sat 100%. GENERAL APPEARANCE:  The patient is an early [place] American female, who appears in no acute distress.  She is alert, oriented, pleasant, and cooperative to examination. HEENT:  Eyes; PERRLA.  EOMI.  Sclerae clear.  Nose, no drainage.  Oral cavity, moist mucosa. NECK:  Supple.  No lymphadenopathy.  No JVD. HEART:  Regular S1 and S2 without gallops, murmurs or rubs. LUNGS:  Clear to auscultation. ABDOMEN:  Soft, nontender.  No organomegaly or masses palpable. EXTREMITIES:  No clubbing, cyanosis, or edema. NEUROLOGICAL: Cranial nerves II-XII grossly intact.  Motor strength 5/5 in the upper and lower extremities.  Sensory intact.  Babinski  negative. Cerebellum, normal finger-to-nose.  LABORATORY ASSESSMENT:  CMP normal.  CBC normal except for hemoglobin 11 and hematocrit 33.4.  UA negative.  Chest x-ray, low volume chest. Increased lower lobe patchy density on the lateral view, may represent atelectasis or airspace disease/ammonia.  CT scan of the head, NAD.  ASSESSMENT AND PLAN: 72. 75 year old female with a history of multiple myeloma, presenting     with a transient change in mental status, which resolved after     administration of Narcan intravenously by emergency medical     services.  Plan:  Admit the patient to observation.  I do not think     the patient has a pneumonia based on this x-ray finding since the     patient has not been with a cough, shortness of breath, fever,     diaphoresis, sputum production, and her blood work appears pretty     normal.  In addition, her exam is also very benign.  I will hold     off on the antibiotics for now.  Monitor her temperature and pulse     and see how she does. 2. Hypertension, uncontrolled.  Resume lisinopril.  Consider adding     amlodipine if blood pressure persistently high. 3. We will contact her primary care physician to find out the reason     why she is taking Coumadin.          ______________________________ Jonny Ruiz, MD     GL/MEDQ  D:  05/16/2011  T:  05/16/2011  Job:  [id number]  cc:   [name]. [name], M.D. Fax: [phone]  Electronically Signed by Jonny Ruiz MD on 05/26/2011 04:11:50 PM

## 2011-05-26 NOTE — Telephone Encounter (Signed)
Noted  

## 2011-05-29 NOTE — Discharge Summary (Signed)
**[name] [name]** NAMEIVIE, SAVITT NO.:  [id number]  MEDICAL RECORD NO.:  [id number]  LOCATION:  1320                         FACILITY:  [facility]  PHYSICIAN:  Hillery [name], M.D.   DATE OF BIRTH:  Nov 26, 1935  DATE OF ADMISSION:  05/16/2011 DATE OF DISCHARGE:  05/17/2011                              DISCHARGE SUMMARY   PRIMARY CARE PHYSICIAN:  [name]. Para March, M.D.  ONCOLOGIST:  Lajuana Matte, M.D.  DISCHARGE DIAGNOSES: 1. Toxic encephalopathy secondary to narcotics with resolution of     symptoms status post Narcan. 2. History of chronic deep venous thrombosis on chronic Coumadin. 3. History of multiple myeloma. 4. Chronic anemia. 5. Chronic thrombocytopenia. 6. Hypertension. 7. History of hyperlipidemia. 8. History of frequent falls. 9. Chronic pain. 10.Gastroesophageal reflux disease. 11.Anxiety disorder. 12.Type 2 diabetes.  DISCHARGE MEDICATIONS: 1. Tylenol 650 mg p.o. q.4 hours p.r.n. pain. 2. Advil 200 mg p.o. q.6 hours p.r.n. pain. 3. Zyrtec 10 mg p.o. daily p.r.n. allergy symptoms. 4. Oxycodone 2.5 to 5 mg p.o. q.2 hours p.r.n. pain. 5. Alprazolam 0.5 mg p.o. q.12 hours p.r.n. anxiety/sleep. 6. Calcium carbonate 600 mg/vitamin D one tablet p.o. t.i.d. 7. Revlimid 25 mg p.o. daily, 21 days on followed by 7 days off. 8. Dexamethasone 40 mg p.o. q. week on Fridays. 9. Glipizide XL 2.5 mg p.o. daily. 10.Klor-Con 20 mEq p.o. daily. 11.Lisinopril 20 mg p.o. daily. 12.Magnesium oxide 400 mg p.o. daily.  [name]:  OxyContin has been discontinued.  CONSULTATIONS:  None.  BRIEF ADMISSION HPI:  [name] patient is a 75 year old female who was brought to [name] for evaluation of altered mental status.  [name] patient was essentially found on [name] floor by her family staring off into space and was relatively unresponsive.  She was given Narcan by EMS and had improvement in her symptoms and was back to baseline when she was evaluated by [name] emergency department  physicians.  Nevertheless, because of some abnormalities on chest radiography and concerns for an underlying cause of her mental status changes, she was referred to [name] hospitalist service for inpatient observation.  [name] full details, [name] see [name] dictated report done by Dr. Jordan Hawks.  PROCEDURES AND DIAGNOSTIC STUDIES: 1. CT scan of [name] head on May 16, 2011 showed no intracranial     hemorrhage.  Small vessel disease-type changes.  No CT evidence of     large acute infarct.  Small acute infarct cannot be excluded.     Small lytic lesions throughout [name] skull unchanged consistent with     [name] patient's history of myeloma. 2. Chest x-ray on May 16, 2011 showed low volume chest.     Increased lower lobe patchy density on [name] lateral view, which may     represent atelectasis or air space disease/pneumonia.  DISCHARGE LABORATORY VALUES:  PT was 32.4 with an INR of 3.10.  White blood cell count was 4.5, hemoglobin 9.2, hematocrit 28.5, platelets 137.  HOSPITAL COURSE BY PROBLEM: 1. Toxic encephalopathy secondary to narcotics:  [name] patient's rapid     resolution of her symptoms after Narcan was given suggest that her     altered mental status was most likely due to  a toxic buildup of     narcotics in her system.  [name] patient is elderly and on long-acting     narcotics for pain control and after a lengthy discussion with her     family, it was decided that we would discontinue [name] OxyContin and     use only small doses of oxycodone for pain control.  It was also     discussed with [name] patient and her family regarding alternative     measures of pain control including heating pads and nonnarcotic     pain medications for symptom control.  Because of concerns of     pneumonia given her chest radiographs done on initial presentation,     she was referred to [name] hospitalist service for further evaluation.     [name] patient does not have leukocytosis, fever, or any clinical     signs  or symptoms suggestive of pneumonia.  She was given a dose of     vancomycin and Zosyn in [name] emergency department, [name] she is not     felt to have pneumonia based on clinical findings and therefore,     antibiotics have been discontinued. 2. History of chronic deep venous thrombosis on chronic Coumadin     therapy:  [name] patient did have a screening CT scan done, which did     not show any evidence of cerebral bleed.  Her INR was slightly     supratherapeutic and her Coumadin was held on May 16, 2011.     She can resume her usual Coumadin dosing and follow up with her     primary care physician for further monitoring of her PT/INR and     adjustment of her Coumadin dosing. 3. History of multiple myeloma:  [name] patient will continue on Revlimid     and dexamethasone per her usual chemotherapy regimen.  She should     follow up with Dr. [name]. 4. Chronic anemia/thrombocytopenia:  Related to her multiple myeloma     diagnosis.  Her counts were stable. 5. Hypertension:  [name] patient's blood pressure is well-controlled with     a discharge blood pressure of 120/71. 6. History of hyperlipidemia:  [name] patient is not currently on any  therapy to address this.  Defer to primary care physician regarding     further evaluation and treatment if indicated. 7. Frequent falls:  [name] patient is on chronic Coumadin and with her     frequent falls, would recommend consideration of discontinuation of     Coumadin.  According to [name] family, she has a history of recurrent     deep venous thrombosis and this is why she is on Coumadin.  We will     defer to [name] patient's primary care physician regarding [name]     appropriateness of continuing these medications. 8. Chronic pain:  [name] patient is on high doses of OxyContin and these     appeared to have built-up in her system causing her to become toxic     on these medications.  We have discontinued OxyContin and have     outlined a safer pain management  regimen with her daughter.  This     should be reevaluated closely by her primary care physician to see     if adjustments need to be made. 9. Gastroesophageal reflux disease:  No active complaints during this     hospital stay. 10.Anxiety disorder:  [name] patient can continue on her p.r.n. Xanax.  11.Type 2 diabetes:  [name] patient can resume glipizide at discharge. 12.Allergic conjunctivitis/rhinorrhea:  [name] patient was advised to use     over-[name]-counter Zyrtec for symptom relief as needed.  DISPOSITION:  [name] patient is medically stable and will be discharged home.  She has good family support.  CONDITION ON DISCHARGE:  Improved.  DISCHARGE INSTRUCTIONS:  Activity:  Increase activity slowly, walk with assistance with a walker if available.  May shower/bathe.  Diet:  Low-sodium, heart-healthy, diabetic.  Followup:  Dr. [name] in 1-2 weeks and Dr. [name] as needed. Call for appointment times.  Time spent coordinating care for discharge, discharge instructions including face-to-face time and discussing [name] plan of care with [name] patient's family equals approximately 40 minutes.     Hillery [name], M.D.     CR/MEDQ  D:  05/17/2011  T:  05/17/2011  Job:  [id number]  cc:   [name]. Para March, M.D. Fax: [phone]  Lajuana Matte, M.D.  Electronically Signed by Hillery [name] M.D. on 05/29/2011 05:50:59 PM

## 2011-05-30 ENCOUNTER — Emergency Department (HOSPITAL_COMMUNITY)
Admission: EM | Admit: 2011-05-30 | Discharge: 2011-05-31 | Disposition: A | Discharge status: Home / Self Care | Payer: Medicare Other | Attending: Emergency Medicine | Admitting: Emergency Medicine

## 2011-05-30 DIAGNOSIS — C9000 Multiple myeloma not having achieved remission: Secondary | ICD-10-CM | POA: Insufficient documentation

## 2011-05-30 DIAGNOSIS — T50901A Poisoning by unspecified drugs, medicaments and biological substances, accidental (unintentional), initial encounter: Secondary | ICD-10-CM | POA: Insufficient documentation

## 2011-05-30 DIAGNOSIS — R5381 Other malaise: Secondary | ICD-10-CM | POA: Insufficient documentation

## 2011-05-30 DIAGNOSIS — E119 Type 2 diabetes mellitus without complications: Secondary | ICD-10-CM | POA: Insufficient documentation

## 2011-05-30 DIAGNOSIS — E7800 Pure hypercholesterolemia, unspecified: Secondary | ICD-10-CM | POA: Insufficient documentation

## 2011-05-30 DIAGNOSIS — R5383 Other fatigue: Secondary | ICD-10-CM | POA: Insufficient documentation

## 2011-05-30 DIAGNOSIS — I10 Essential (primary) hypertension: Secondary | ICD-10-CM | POA: Insufficient documentation

## 2011-05-30 DIAGNOSIS — E785 Hyperlipidemia, unspecified: Secondary | ICD-10-CM | POA: Insufficient documentation

## 2011-05-30 LAB — CBC
HCT: 30.3 % — ABNORMAL LOW (ref 36.0–46.0)
Hemoglobin: 9.9 g/dL — ABNORMAL LOW (ref 12.0–15.0)
MCV: 95.3 fL (ref 78.0–100.0)
RBC: 3.18 MIL/uL — ABNORMAL LOW (ref 3.87–5.11)
RDW: 16.2 % — ABNORMAL HIGH (ref 11.5–15.5)
WBC: 6.6 10*3/uL (ref 4.0–10.5)

## 2011-05-30 LAB — BASIC METABOLIC PANEL
BUN: 14 mg/dL (ref 6–23)
CO2: 26 mEq/L (ref 19–32)
Chloride: 108 mEq/L (ref 96–112)
Creatinine, Ser: 0.76 mg/dL (ref 0.50–1.10)
Glucose, Bld: 113 mg/dL — ABNORMAL HIGH (ref 70–99)

## 2011-05-30 LAB — URINALYSIS, ROUTINE W REFLEX MICROSCOPIC
Glucose, UA: 250 mg/dL — AB
Leukocytes, UA: NEGATIVE
Specific Gravity, Urine: 1.016 (ref 1.005–1.030)
pH: 7 (ref 5.0–8.0)

## 2011-05-30 LAB — DIFFERENTIAL
Eosinophils Relative: 0 % (ref 0–5)
Lymphocytes Relative: 6 % — ABNORMAL LOW (ref 12–46)
Lymphs Abs: 0.4 10*3/uL — ABNORMAL LOW (ref 0.7–4.0)
Neutro Abs: 5.3 10*3/uL (ref 1.7–7.7)

## 2011-06-02 LAB — CROSSMATCH: Antibody Screen: NEGATIVE

## 2011-06-02 LAB — ABO/RH: ABO/RH(D): B POS

## 2011-06-03 LAB — CROSSMATCH: ABO/RH(D): B POS

## 2011-06-03 LAB — CARDIAC PANEL(CRET KIN+CKTOT+MB+TROPI)
CK, MB: 2.2
Relative Index: INVALID
Total CK: 48
Troponin I: 0.01

## 2011-06-03 LAB — CBC
HCT: 27.6 — ABNORMAL LOW
HCT: 28.3 — ABNORMAL LOW
Hemoglobin: 9.3 — ABNORMAL LOW
Hemoglobin: 9.7 — ABNORMAL LOW
Hemoglobin: 9.7 — ABNORMAL LOW
Hemoglobin: 9.9 — ABNORMAL LOW
MCHC: 33.4
MCHC: 34.3
MCHC: 34.6
MCHC: 34.8
MCV: 93.2
MCV: 94.5
MCV: 94.6
MCV: 94.6
MCV: 95
Platelets: 91 — ABNORMAL LOW
Platelets: 94 — ABNORMAL LOW
RBC: 2.91 — ABNORMAL LOW
RBC: 3.04 — ABNORMAL LOW
RBC: 3.09 — ABNORMAL LOW
RDW: 13.9
RDW: 14
RDW: 14
RDW: 14
RDW: 14.1
WBC: 3.5 — ABNORMAL LOW
WBC: 4.3

## 2011-06-03 LAB — BASIC METABOLIC PANEL
BUN: 20
BUN: 5 — ABNORMAL LOW
CO2: 20
CO2: 21
CO2: 21
Calcium: 5.6 — CL
Calcium: 5.6 — CL
Calcium: 7.5 — ABNORMAL LOW
Chloride: 109
Chloride: 112
Chloride: 112
Chloride: 115 — ABNORMAL HIGH
Creatinine, Ser: 0.79
Creatinine, Ser: 0.99
GFR calc Af Amer: >60
GFR calc Af Amer: >60
GFR calc Af Amer: >60
GFR calc non Af Amer: 56 — ABNORMAL LOW
GFR calc non Af Amer: 60 — ABNORMAL LOW
Glucose, Bld: 104 — ABNORMAL HIGH
Glucose, Bld: 109 — ABNORMAL HIGH
Glucose, Bld: 116 — ABNORMAL HIGH
Glucose, Bld: 97
Potassium: 3.2 — ABNORMAL LOW
Potassium: 3.4 — ABNORMAL LOW
Sodium: 140
Sodium: 144

## 2011-06-03 LAB — RETICULOCYTES: Retic Ct Pct: 0.2 — ABNORMAL LOW

## 2011-06-03 LAB — IRON AND TIBC
Saturation Ratios: 60 — ABNORMAL HIGH
TIBC: 229 — ABNORMAL LOW

## 2011-06-03 LAB — MAGNESIUM
Magnesium: 1.4 — ABNORMAL LOW
Magnesium: 2.6 — ABNORMAL HIGH

## 2011-06-03 LAB — FERRITIN: Ferritin: 2214 — ABNORMAL HIGH (ref 10–291)

## 2011-06-03 LAB — URINALYSIS, ROUTINE W REFLEX MICROSCOPIC
Bilirubin Urine: NEGATIVE
Ketones, ur: NEGATIVE
Nitrite: NEGATIVE
Protein, ur: NEGATIVE
Urobilinogen, UA: 0.2

## 2011-06-03 LAB — DIFFERENTIAL
Eosinophils Absolute: 0
Eosinophils Absolute: 0
Eosinophils Relative: 0
Lymphocytes Relative: 4 — ABNORMAL LOW
Lymphs Abs: 0.2 — ABNORMAL LOW
Lymphs Abs: 0.2 — ABNORMAL LOW
Monocytes Relative: 12
Monocytes Relative: 13 — ABNORMAL HIGH
Neutro Abs: 3.5
Neutrophils Relative %: 83 — ABNORMAL HIGH
Neutrophils Relative %: 83 — ABNORMAL HIGH

## 2011-06-03 LAB — COMPREHENSIVE METABOLIC PANEL
ALT: 17
AST: 15
Calcium: 8.4
Creatinine, Ser: 1.47 — ABNORMAL HIGH
GFR calc Af Amer: 43 — ABNORMAL LOW
GFR calc non Af Amer: 35 — ABNORMAL LOW
Sodium: 135
Total Protein: 6.6

## 2011-06-03 LAB — IGM: IgM, Serum: 13 — ABNORMAL LOW

## 2011-06-03 LAB — TROPONIN I: Troponin I: 0.01

## 2011-06-03 LAB — PROTIME-INR
INR: 1.1
Prothrombin Time: 14.8

## 2011-06-03 LAB — FOLATE: Folate: 8.7

## 2011-06-03 LAB — APTT: aPTT: 28

## 2011-06-03 LAB — POCT CARDIAC MARKERS
CKMB, poc: 3
Myoglobin, poc: 303

## 2011-06-03 LAB — SODIUM, URINE, RANDOM: Sodium, Ur: 113

## 2011-06-03 LAB — CK TOTAL AND CKMB (NOT AT ARMC): Total CK: 65

## 2011-06-03 LAB — OCCULT BLOOD X 1 CARD TO LAB, STOOL: Fecal Occult Bld: NEGATIVE

## 2011-06-04 LAB — CBC
HCT: 30.3 — ABNORMAL LOW
Hemoglobin: 10.2 — ABNORMAL LOW
MCHC: 33.7
MCV: 96.6
RBC: 3.14 — ABNORMAL LOW
WBC: 5.8

## 2011-06-04 LAB — DIFFERENTIAL
Eosinophils Absolute: 0.3
Eosinophils Relative: 5
Lymphocytes Relative: 23
Lymphs Abs: 1.3
Monocytes Absolute: 0.4
Monocytes Relative: 8

## 2011-06-04 LAB — TISSUE HYBRIDIZATION (BONE MARROW)-NCBH

## 2011-06-04 LAB — CHROMOSOME ANALYSIS, BONE MARROW

## 2011-06-06 ENCOUNTER — Ambulatory Visit: Payer: Medicare Other | Admitting: Family Medicine

## 2011-06-06 ENCOUNTER — Encounter: Payer: Self-pay | Admitting: Family Medicine

## 2011-06-06 ENCOUNTER — Ambulatory Visit (INDEPENDENT_AMBULATORY_CARE_PROVIDER_SITE_OTHER): Payer: Medicare Other | Admitting: Family Medicine

## 2011-06-06 DIAGNOSIS — C9000 Multiple myeloma not having achieved remission: Secondary | ICD-10-CM

## 2011-06-06 MED ORDER — OXYCODONE HCL 5 MG PO TABS: 7.5000 mg | ORAL_TABLET | ORAL | Status: DC | PRN

## 2011-06-06 NOTE — Patient Instructions (Addendum)
Take 1.5 of the oxycodone pills every 4-6 hours.  If that doesn't help with the pain, then increase to 2 pills at a time.  Sedation caution.  Call me Monday with an update- total dose in 24hour period, activity level, etc.  Thanks.

## 2011-06-08 ENCOUNTER — Encounter: Payer: Self-pay | Admitting: Family Medicine

## 2011-06-08 NOTE — Progress Notes (Signed)
Chronic pain due to myeloma.  Was seen at ER with unintended overdose of oxycontin.  It was likely a gradual over-accumulation of the drug.  No only on short acting oxycodone and alert, but with sig pain, esp in R thigh.  No FCNAV. Sugar controlled on home checks and some loose stools.  O/w at baseline.  5mg  of oxycodone doesn't affect the pain level or make her drowsy.    I called pharmacy to verify dose and lack of APAP in current oxycodone rx.   Meds, vitals, and allergies reviewed.   ROS: See HPI.  Otherwise, noncontributory.  nad Alert, speech at baseline and not slurred.   ncat rrr ctab

## 2011-06-08 NOTE — Assessment & Plan Note (Addendum)
With chronic pain.  Will inc the oxy IR to 7.5mg  per dose and then 10mg  per dose if needed.  Family to watch for sedation.  They'll call back with update on condition, alertness, and total daily mg use early next week.  App help of all involved. I talked with family and pt about trying to minimize discomfort and limit risk of oversedation.  They understand.  She is off long acting opiates.  >25 min spent with face to face with patient, >50% counseling and/or coordinating care

## 2011-06-16 ENCOUNTER — Telehealth: Payer: Self-pay | Admitting: *Deleted

## 2011-06-16 ENCOUNTER — Other Ambulatory Visit: Payer: Self-pay | Admitting: *Deleted

## 2011-06-16 MED ORDER — OXYCODONE HCL 5 MG PO TABS: 7.5000 mg | ORAL_TABLET | Freq: Four times a day (QID) | ORAL | Status: AC | PRN

## 2011-06-16 NOTE — Telephone Encounter (Signed)
Patient's daughter says the patient was on 10 mg of Oxycodone for her pain and has now been lowered to a 5 mg, taking 1-1/2 tabs.  They have only a few of the 5 mg tabs remaining and they are asking if they can give her the 10 mg tabs.

## 2011-06-16 NOTE — Telephone Encounter (Signed)
Dr. Para March says take 1/2 of 10 mg tab and 1/2 of 5 mg tab for now and then a new Rx can be done for pick up tomorrow.  Patient's daughter advised.  Patient is currently taking Oxycodone 5 mg, one and one half tablet every 4 to 6 hours (averaging about 4 times a day).  Patient is functioning well on that dose, doing some light housework, etc.

## 2011-06-16 NOTE — Telephone Encounter (Signed)
Please give to pt.  Thanks.   

## 2011-06-16 NOTE — Telephone Encounter (Signed)
Daughter advised.  Rx. Ready for pick up at front desk.

## 2011-06-18 ENCOUNTER — Other Ambulatory Visit: Payer: Self-pay | Admitting: Internal Medicine

## 2011-06-18 ENCOUNTER — Encounter (HOSPITAL_BASED_OUTPATIENT_CLINIC_OR_DEPARTMENT_OTHER): Payer: Medicare Other | Admitting: Internal Medicine

## 2011-06-18 ENCOUNTER — Telehealth: Payer: Self-pay | Admitting: *Deleted

## 2011-06-18 DIAGNOSIS — Z86718 Personal history of other venous thrombosis and embolism: Secondary | ICD-10-CM

## 2011-06-18 DIAGNOSIS — I82409 Acute embolism and thrombosis of unspecified deep veins of unspecified lower extremity: Secondary | ICD-10-CM

## 2011-06-18 DIAGNOSIS — Z7901 Long term (current) use of anticoagulants: Secondary | ICD-10-CM

## 2011-06-18 DIAGNOSIS — C9000 Multiple myeloma not having achieved remission: Secondary | ICD-10-CM

## 2011-06-18 LAB — CBC WITH DIFFERENTIAL/PLATELET
BASO%: 1 % (ref 0.0–2.0)
HCT: 33.9 % — ABNORMAL LOW (ref 34.8–46.6)
MCHC: 32.7 g/dL (ref 31.5–36.0)
MONO#: 0.5 10*3/uL (ref 0.1–0.9)
RBC: 3.61 10*6/uL — ABNORMAL LOW (ref 3.70–5.45)
WBC: 2.9 10*3/uL — ABNORMAL LOW (ref 3.9–10.3)
lymph#: 0.6 10*3/uL — ABNORMAL LOW (ref 0.9–3.3)

## 2011-06-18 LAB — LACTATE DEHYDROGENASE: LDH: 178 U/L (ref 94–250)

## 2011-06-18 LAB — COMPREHENSIVE METABOLIC PANEL
ALT: 18 U/L (ref 0–35)
CO2: 27 mEq/L (ref 19–32)
Calcium: 9.2 mg/dL (ref 8.4–10.5)
Chloride: 109 mEq/L (ref 96–112)
Sodium: 144 mEq/L (ref 135–145)
Total Protein: 5.9 g/dL — ABNORMAL LOW (ref 6.0–8.3)

## 2011-06-18 LAB — PROTIME-INR

## 2011-06-18 NOTE — Telephone Encounter (Signed)
When the patient's daughter was advised that the Rx was ready for pick up, she said she had spoken with her sister and Dr. Shirline Frees had taken care of this Rx request.  Prescription was shredded here at our office.

## 2011-06-20 NOTE — Telephone Encounter (Signed)
Note completed 

## 2011-06-28 ENCOUNTER — Telehealth: Payer: Self-pay | Admitting: Internal Medicine

## 2011-06-28 NOTE — Telephone Encounter (Signed)
Moved 11/12 appt to 11/14 @ 1:15 pm due to epic conversion. lmonvm for pt re change w/new d/t + mailed not scheudule today. Also lmonvm for dtr kimberly re change.

## 2011-06-30 ENCOUNTER — Other Ambulatory Visit: Payer: Self-pay | Admitting: *Deleted

## 2011-06-30 MED ORDER — ONETOUCH ULTRASOFT LANCETS MISC: Status: DC

## 2011-07-08 ENCOUNTER — Encounter: Payer: Self-pay | Admitting: Family Medicine

## 2011-07-08 ENCOUNTER — Ambulatory Visit (INDEPENDENT_AMBULATORY_CARE_PROVIDER_SITE_OTHER): Payer: Medicare Other | Admitting: Family Medicine

## 2011-07-08 DIAGNOSIS — R609 Edema, unspecified: Secondary | ICD-10-CM

## 2011-07-08 NOTE — Patient Instructions (Signed)
Keep your leg elevated, limit salt and let me know if your leg gets red or hurts more than normal.  Take care.

## 2011-07-09 ENCOUNTER — Encounter: Payer: Self-pay | Admitting: Family Medicine

## 2011-07-09 DIAGNOSIS — R609 Edema, unspecified: Secondary | ICD-10-CM | POA: Insufficient documentation

## 2011-07-09 NOTE — Assessment & Plan Note (Signed)
Lungs clear, no elevated BP, no CP, not sob.  I doubt cardiac source given the above and the lack of edema on L foot.   She is some better today.  Continue elevation and limit salt.  This could have been related to prev radiation/DVT, but with pulse intact and current coumadin tx, I don't see that re-eval with u/s would change mgmt.  D/w pt and family, they agree.  Notify me if red, progressive edema.  They agree.

## 2011-07-09 NOTE — Progress Notes (Signed)
Edema in R foot over last 2 weeks. Some better today.  Not ttp, not painful.  No CP, not sob.  H/o DVT on R leg.  No trauma.  On coumadin.  Edema some better after elevation.  Sugar and BP had been controlled at home.  BP, sugar log reviewed today.   Meds, vitals, and allergies reviewed.   ROS: See HPI.  Otherwise, noncontributory.  nad ncat Mmm rrr ctab L and R calf with equal circumference, but R ankle with 1-2+ pitting edema.  No erythema, no skin breakdown.  Pulse intact.  No calf pain on palpation.

## 2011-07-12 ENCOUNTER — Encounter: Payer: Self-pay | Admitting: *Deleted

## 2011-07-14 ENCOUNTER — Other Ambulatory Visit: Payer: Self-pay | Admitting: Internal Medicine

## 2011-07-14 DIAGNOSIS — C9000 Multiple myeloma not having achieved remission: Secondary | ICD-10-CM

## 2011-07-16 ENCOUNTER — Ambulatory Visit (HOSPITAL_BASED_OUTPATIENT_CLINIC_OR_DEPARTMENT_OTHER): Payer: Medicare Other | Admitting: Physician Assistant

## 2011-07-16 ENCOUNTER — Other Ambulatory Visit (HOSPITAL_BASED_OUTPATIENT_CLINIC_OR_DEPARTMENT_OTHER): Payer: Medicare Other | Admitting: Lab

## 2011-07-16 ENCOUNTER — Ambulatory Visit (HOSPITAL_BASED_OUTPATIENT_CLINIC_OR_DEPARTMENT_OTHER): Payer: Medicare Other

## 2011-07-16 ENCOUNTER — Other Ambulatory Visit: Payer: Self-pay | Admitting: Internal Medicine

## 2011-07-16 VITALS — BP 117/62 | HR 64 | Temp 97.0°F | Ht 61.0 in | Wt 121.1 lb

## 2011-07-16 DIAGNOSIS — Z7901 Long term (current) use of anticoagulants: Secondary | ICD-10-CM

## 2011-07-16 DIAGNOSIS — C9000 Multiple myeloma not having achieved remission: Secondary | ICD-10-CM

## 2011-07-16 DIAGNOSIS — Z86718 Personal history of other venous thrombosis and embolism: Secondary | ICD-10-CM

## 2011-07-16 DIAGNOSIS — I82409 Acute embolism and thrombosis of unspecified deep veins of unspecified lower extremity: Secondary | ICD-10-CM

## 2011-07-16 LAB — COMPREHENSIVE METABOLIC PANEL
ALT: 12 U/L (ref 0–35)
AST: 15 U/L (ref 0–37)
CO2: 26 mEq/L (ref 19–32)
Chloride: 105 mEq/L (ref 96–112)
Creatinine, Ser: 0.92 mg/dL (ref 0.50–1.10)
Sodium: 139 mEq/L (ref 135–145)
Total Bilirubin: 0.3 mg/dL (ref 0.3–1.2)
Total Protein: 5.9 g/dL — ABNORMAL LOW (ref 6.0–8.3)

## 2011-07-16 LAB — CBC WITH DIFFERENTIAL/PLATELET
Basophils Absolute: 0 10*3/uL (ref 0.0–0.1)
Eosinophils Absolute: 0.3 10*3/uL (ref 0.0–0.5)
HCT: 34.4 % — ABNORMAL LOW (ref 34.8–46.6)
HGB: 11.1 g/dL — ABNORMAL LOW (ref 11.6–15.9)
LYMPH%: 17.5 % (ref 14.0–49.7)
MCV: 94.8 fL (ref 79.5–101.0)
MONO#: 0.7 10*3/uL (ref 0.1–0.9)
MONO%: 23.6 % — ABNORMAL HIGH (ref 0.0–14.0)
NEUT#: 1.3 10*3/uL — ABNORMAL LOW (ref 1.5–6.5)
NEUT%: 45.4 % (ref 38.4–76.8)
Platelets: 162 10*3/uL (ref 145–400)
RBC: 3.63 10*6/uL — ABNORMAL LOW (ref 3.70–5.45)
WBC: 2.8 10*3/uL — ABNORMAL LOW (ref 3.9–10.3)
nRBC: 0 % (ref 0–0)

## 2011-07-16 LAB — PROTIME-INR

## 2011-07-16 LAB — LACTATE DEHYDROGENASE: LDH: 182 U/L (ref 94–250)

## 2011-07-16 MED ORDER — SODIUM CHLORIDE 0.9 % IV SOLN
Freq: Once | INTRAVENOUS | Status: AC
  Administered 2011-07-16: 15:00:00 via INTRAVENOUS

## 2011-07-16 MED ORDER — ZOLEDRONIC ACID 4 MG/100ML IV SOLN
4.0000 mg | Freq: Once | INTRAVENOUS | Status: AC
  Administered 2011-07-16: 4 mg via INTRAVENOUS
  Filled 2011-07-16: qty 100

## 2011-07-16 MED ORDER — OXYCODONE HCL 5 MG PO TABS: ORAL_TABLET | ORAL | Status: DC

## 2011-07-16 MED ORDER — ALPRAZOLAM 0.5 MG PO TABS: 0.5000 mg | ORAL_TABLET | Freq: Two times a day (BID) | ORAL | Status: DC

## 2011-07-16 NOTE — Patient Instructions (Signed)
1556-Pt discharged via wheelchair with next appointment confirmed.  Pt aware to call with any questions or concerns.

## 2011-07-16 NOTE — Progress Notes (Signed)
No images are attached to the encounter. No scans are attached to the encounter. No scans are attached to the encounter. [facility] OFFICE PROGRESS NOTE  [name], MD, MD [address] [place] [address]  DIAGNOSIS: 1. multiple myeloma diagnosed in September 2009, 2. history of bilateral lower extremity deep vein thrombosis diagnosed in November 2009 3. acute on chronic deep vein thrombosis in the left lower extremity diagnosed in October 2010  PRIOR THERAPY: 1. status post palliative radiotherapy to the right femur and is she'll tuberosity, first was done in December 2011 and the second treatment was completed Jan 08 2011, and the third treatment to the right distal femur completed on 04/30/2011 2. status post palliative radiotherapy to the left proximal femur completed 02/28/2011  CURRENT THERAPY: 1. Revlimid 25 mg by mouth daily for 21 days every 4 weeks in addition to oral Decadron at 40 mg by mouth on a weekly basis status post 37 cycles. 2. Coumadin 5 mg on Mondays and Thursdays and 2.5 mg on all other days. 3. Zometa 4 mg IV given every 2 months  INTERVAL HISTORY: [name] 75 y.o. female returns for scheduled regular monthly visit for followup of her multiple myeloma and history of DVTs. She states that she has not missed any doses of her Coumadin or changed her diet in any way. He is having increased pain in her legs. Refill for her oxycodone currently taking 7.5 mg every 6 hours and experiencing increased anxiety and is now taking her Xanax 0.5 mg one tablet by mouth twice a day. Also needs a new prescription for her Xanax. Having more days where she's not really feeling very well, lying down and resting more. She's not had any further emergency room visits.   MEDICAL HISTORY: Past Medical History  Diagnosis Date  . Blood transfusion   . Depression     Mild  . History of chicken pox   . Hyperlipidemia   . Hypertension   . Degenerative joint  disease   . Diabetes mellitus     Steroid related  . Phlebitis   . Multiple myeloma     per Dr. [name], s/p palliative radiation for leg pain 2011 per Dr. Roselind Messier    ALLERGIES:   has no known allergies.  MEDICATIONS:  Current Outpatient Prescriptions  Medication Sig Dispense Refill  . ALPRAZolam (XANAX) 0.5 MG tablet Take 1/2 tablet by mouth every morning and 1 tablet by mouth at bedtime as needed.       . Calcium Carbonate-Vitamin D 600-400 MG-UNIT per tablet Take 1 tablet by mouth 3 (three) times daily with meals.        Marland Kitchen dexamethasone (DECADRON) 4 MG tablet Take 10 tablets by mouth once weekly with food as directed.       Marland Kitchen glipiZIDE (GLUCOTROL XL) 2.5 MG 24 hr tablet TAKE ONE TABLET BY MOUTH EVERY DAY  90 tablet  2  . ibuprofen (ADVIL,MOTRIN) 200 MG tablet Take 200 mg by mouth every 6 (six) hours as needed.        . Lancets (ONETOUCH ULTRASOFT) lancets Test blood sugar once daily or as directed.  100 each  11  . lenalidomide (REVLIMID) 25 MG capsule Take 1 capsule per day for 21 days every 28 days.       . magnesium oxide (MAG-OX) 400 MG tablet Take 400 mg by mouth daily.       . ONE TOUCH ULTRA TEST test strip TEST BLOOD SUGAR  ONCE DAILY OR AS DIRECTED  25 each  6  . potassium chloride SA (KLOR-CON M20) 20 MEQ tablet Take 20 mEq by mouth daily.        . prochlorperazine (COMPAZINE) 10 MG tablet Take 10 mg by mouth every 6 (six) hours as needed.        Marland Kitchen TEMAZEPAM PO Take 10 mg by mouth at bedtime.        Marland Kitchen warfarin (COUMADIN) 2 MG tablet Take 2 mg by mouth daily.        Marland Kitchen warfarin (COUMADIN) 5 MG tablet Take 5 mg by mouth daily. 1 tab by mouth on Mondays and Thursdays, 1/2 tab by mouth on all other days       No current facility-administered medications for this visit.   Facility-Administered Medications Ordered in Other Visits  Medication Dose Route Frequency Provider Last Rate Last Dose  . 0.9 %  sodium chloride infusion   Intravenous Once Mohamed K. Mohamed, MD      .  Zoledronic Acid (ZOMETA) 4 mg IVPB  4 mg Intravenous Once Mohamed K. [name], MD   4 mg at 07/16/11 1530    SURGICAL HISTORY:  Past Surgical History  Procedure Date  . Abdominal hysterectomy     REVIEW OF SYSTEMS:  A comprehensive review of systems was negative except for: Constitutional: positive for malaise Musculoskeletal: positive for bone pain   PHYSICAL EXAMINATION: General appearance: alert, cooperative and no distress Head: Normocephalic, without obvious abnormality, atraumatic Neck: no adenopathy, no carotid bruit, no JVD, supple, symmetrical, trachea midline and thyroid not enlarged, symmetric, no tenderness/mass/nodules Lymph nodes: Cervical, supraclavicular, and axillary nodes normal. Resp: clear to auscultation bilaterally Cardio: regular rate and rhythm, S1, S2 normal, no murmur, click, rub or gallop GI: soft, non-tender; bowel sounds normal; no masses,  no organomegaly Extremities: edema Greater on the right than on the left and Homans sign is negative, no sign of DVT  ECOG PERFORMANCE STATUS: 1 - Symptomatic but completely ambulatory  Blood pressure 117/62, pulse 64, temperature 97 F (36.1 C), height 5\' 1"  (1.549 m), weight 121 lb 1.6 oz (54.931 kg).  LABORATORY DATA: Lab Results  Component Value Date   WBC 2.8* 07/16/2011   HGB 11.1* 07/16/2011   HCT 34.4* 07/16/2011   MCV 94.8 07/16/2011   PLT 162 07/16/2011      Chemistry      Component Value Date/Time   NA 144 06/18/2011 1403   K 4.1 06/18/2011 1403   CL 109 06/18/2011 1403   CO2 27 06/18/2011 1403   BUN 11 06/18/2011 1403   CREATININE 0.88 06/18/2011 1403      Component Value Date/Time   CALCIUM 9.2 06/18/2011 1403   CALCIUM  Value: 5.7 CRITICAL RESULT CALLED TO, READ BACK BY AND VERIFIED WITH: JAMIE TRACY,RN [id number] @ 1433 BY J SCOTTON (NOTE)  Amended report. Result repeated and verified. CORRECTED ON 10/14 AT 1429: PREVIOUSLY REPORTED AS Result repeated and verified.* 06/13/2008 1605   ALKPHOS  45 06/18/2011 1403   AST 14 06/18/2011 1403   ALT 18 06/18/2011 1403   BILITOT 0.4 06/18/2011 1403       RADIOGRAPHIC STUDIES:  No results found.  ASSESSMENT/PLAN: Ciliary pleasant 75 year old after American female with multiple myeloma currently being treated with Revlimid Decadron overall tolerating this treatment relatively well. She is also on Coumadin for recurrent deep vein thrombosis. Her INR is slightly subtherapeutic. I've asked her to take 5 mg on Saturday of this week and then resume her  regular schedule next week. The patient was discussed with Dr. [name] prescriptions for her Xanax and oxycodone were refilled. She will followup with Dr. Gwenyth Bouillon in one month. One week prior to that office visit she will have labs consisting of a CBC differential C. met and LDH quantitative immunoglobulin beta-2 microglobulin and serum light chains as well as a PT/INR drawn.     Conni Slipper, PA-C     All questions were answered. The patient knows to call the clinic with any problems, questions or concerns. We can certainly see the patient much sooner if necessary.

## 2011-07-18 ENCOUNTER — Telehealth: Payer: Self-pay | Admitting: *Deleted

## 2011-07-18 NOTE — Telephone Encounter (Signed)
RECEIVED A FAX FROM [facility] CONCERNING A PRESCRIPTION REFILL REQUEST FOR REVLIMID. THIS REQUEST WAS GIVEN TO DR.MOHAMED'S NURSE, STEPHANIE JOHNSON,RN.

## 2011-07-21 ENCOUNTER — Other Ambulatory Visit: Payer: Self-pay | Admitting: Internal Medicine

## 2011-07-22 ENCOUNTER — Other Ambulatory Visit: Payer: Self-pay | Admitting: *Deleted

## 2011-07-22 ENCOUNTER — Encounter: Payer: Self-pay | Admitting: *Deleted

## 2011-07-22 DIAGNOSIS — C9000 Multiple myeloma not having achieved remission: Secondary | ICD-10-CM

## 2011-07-22 MED ORDER — LENALIDOMIDE 25 MG PO CAPS: 25.0000 mg | ORAL_CAPSULE | Freq: Every day | ORAL | Status: DC

## 2011-07-22 NOTE — Progress Notes (Signed)
RECEIVED A FAX FROM [facility] CONCERNING A CONFIRMATION OF FACSIMILE RECEIPT. 

## 2011-07-23 ENCOUNTER — Encounter: Payer: Self-pay | Admitting: *Deleted

## 2011-07-23 NOTE — Progress Notes (Signed)
[facility] faxed confirmation of prescription shipment.  Revlimid shipped 07-22-11 with next business day delivery.

## 2011-07-25 ENCOUNTER — Other Ambulatory Visit: Payer: Self-pay | Admitting: Internal Medicine

## 2011-07-25 ENCOUNTER — Encounter: Payer: Self-pay | Admitting: *Deleted

## 2011-07-25 NOTE — Progress Notes (Signed)
Received fax from [facility] that pt's rx was shipped 07/22/11.  SLJ

## 2011-07-29 ENCOUNTER — Other Ambulatory Visit: Payer: Self-pay | Admitting: Internal Medicine

## 2011-07-29 DIAGNOSIS — C9000 Multiple myeloma not having achieved remission: Secondary | ICD-10-CM

## 2011-07-29 NOTE — Telephone Encounter (Signed)
Per [facility] -they  refilled  oxycodone 5, on 06/16/11 #90. Per Sue Lush daughter, - the last RX was filled at [facility] ( [facility]) because [facility] did not have it. I called [facility] and they refilled it on 07/16/11 ,#90

## 2011-07-30 ENCOUNTER — Other Ambulatory Visit: Payer: Self-pay | Admitting: Physician Assistant

## 2011-07-30 ENCOUNTER — Other Ambulatory Visit: Payer: Self-pay | Admitting: Internal Medicine

## 2011-07-30 DIAGNOSIS — D649 Anemia, unspecified: Secondary | ICD-10-CM

## 2011-07-30 DIAGNOSIS — C9000 Multiple myeloma not having achieved remission: Secondary | ICD-10-CM

## 2011-07-30 MED ORDER — OXYCODONE HCL 5 MG PO TABS: ORAL_TABLET | ORAL | Status: DC

## 2011-07-30 MED ORDER — OXYCODONE HCL 5 MG PO TABS
ORAL_TABLET | ORAL | Status: DC
Start: 2011-07-30 — End: 2011-08-13

## 2011-08-11 ENCOUNTER — Other Ambulatory Visit: Payer: Self-pay | Admitting: Physician Assistant

## 2011-08-11 ENCOUNTER — Other Ambulatory Visit (HOSPITAL_BASED_OUTPATIENT_CLINIC_OR_DEPARTMENT_OTHER): Payer: Medicare Other | Admitting: Lab

## 2011-08-11 DIAGNOSIS — C9000 Multiple myeloma not having achieved remission: Secondary | ICD-10-CM

## 2011-08-11 LAB — CBC WITH DIFFERENTIAL/PLATELET
Basophils Absolute: 0 10*3/uL (ref 0.0–0.1)
Eosinophils Absolute: 0.3 10*3/uL (ref 0.0–0.5)
HCT: 33.9 % — ABNORMAL LOW (ref 34.8–46.6)
HGB: 11.4 g/dL — ABNORMAL LOW (ref 11.6–15.9)
MONO#: 0.5 10*3/uL (ref 0.1–0.9)
NEUT%: 61.3 % (ref 38.4–76.8)
WBC: 3.1 10*3/uL — ABNORMAL LOW (ref 3.9–10.3)
lymph#: 0.5 10*3/uL — ABNORMAL LOW (ref 0.9–3.3)

## 2011-08-11 LAB — PROTHROMBIN TIME: Prothrombin Time: 18 seconds — ABNORMAL HIGH (ref 11.6–15.2)

## 2011-08-12 ENCOUNTER — Other Ambulatory Visit: Payer: Self-pay | Admitting: Internal Medicine

## 2011-08-12 DIAGNOSIS — C9000 Multiple myeloma not having achieved remission: Secondary | ICD-10-CM

## 2011-08-13 ENCOUNTER — Other Ambulatory Visit: Payer: Self-pay | Admitting: Internal Medicine

## 2011-08-13 DIAGNOSIS — C9000 Multiple myeloma not having achieved remission: Secondary | ICD-10-CM

## 2011-08-13 LAB — SPEP & IFE WITH QIG
Albumin ELP: 58.1 % (ref 55.8–66.1)
Alpha-1-Globulin: 5.9 % — ABNORMAL HIGH (ref 2.9–4.9)
Beta 2: 5.6 % (ref 3.2–6.5)
IgM, Serum: 134 mg/dL (ref 52–322)

## 2011-08-13 LAB — COMPREHENSIVE METABOLIC PANEL
BUN: 16 mg/dL (ref 6–23)
CO2: 25 mEq/L (ref 19–32)
Calcium: 8.9 mg/dL (ref 8.4–10.5)
Chloride: 107 mEq/L (ref 96–112)
Creatinine, Ser: 0.93 mg/dL (ref 0.50–1.10)

## 2011-08-13 LAB — KAPPA/LAMBDA LIGHT CHAINS: Lambda Free Lght Chn: 18 mg/dL — ABNORMAL HIGH (ref 0.57–2.63)

## 2011-08-13 LAB — LACTATE DEHYDROGENASE: LDH: 165 U/L (ref 94–250)

## 2011-08-13 MED ORDER — OXYCODONE HCL 5 MG PO TABS: ORAL_TABLET | ORAL | Status: DC

## 2011-08-13 NOTE — Telephone Encounter (Signed)
rx written and locked up and  will be signed by Adrena in am.

## 2011-08-14 ENCOUNTER — Other Ambulatory Visit: Payer: Self-pay | Admitting: Internal Medicine

## 2011-08-14 ENCOUNTER — Telehealth: Payer: Self-pay | Admitting: Internal Medicine

## 2011-08-14 DIAGNOSIS — R110 Nausea: Secondary | ICD-10-CM

## 2011-08-14 MED ORDER — PROCHLORPERAZINE MALEATE 10 MG PO TABS: 10.0000 mg | ORAL_TABLET | Freq: Four times a day (QID) | ORAL | Status: DC | PRN

## 2011-08-14 NOTE — Telephone Encounter (Signed)
Audra notified to pick up rx

## 2011-08-18 ENCOUNTER — Other Ambulatory Visit: Payer: Self-pay | Admitting: *Deleted

## 2011-08-18 ENCOUNTER — Ambulatory Visit (HOSPITAL_BASED_OUTPATIENT_CLINIC_OR_DEPARTMENT_OTHER): Payer: Medicare Other | Admitting: Internal Medicine

## 2011-08-18 ENCOUNTER — Telehealth: Payer: Self-pay | Admitting: Internal Medicine

## 2011-08-18 ENCOUNTER — Ambulatory Visit (HOSPITAL_BASED_OUTPATIENT_CLINIC_OR_DEPARTMENT_OTHER): Payer: Medicare Other

## 2011-08-18 VITALS — BP 146/76 | HR 66 | Temp 97.6°F | Ht 61.0 in | Wt 118.4 lb

## 2011-08-18 DIAGNOSIS — I82409 Acute embolism and thrombosis of unspecified deep veins of unspecified lower extremity: Secondary | ICD-10-CM

## 2011-08-18 DIAGNOSIS — Z86718 Personal history of other venous thrombosis and embolism: Secondary | ICD-10-CM

## 2011-08-18 DIAGNOSIS — C9000 Multiple myeloma not having achieved remission: Secondary | ICD-10-CM

## 2011-08-18 DIAGNOSIS — Z923 Personal history of irradiation: Secondary | ICD-10-CM

## 2011-08-18 DIAGNOSIS — Z7901 Long term (current) use of anticoagulants: Secondary | ICD-10-CM

## 2011-08-18 LAB — CBC WITH DIFFERENTIAL/PLATELET
Basophils Absolute: 0 10*3/uL (ref 0.0–0.1)
Eosinophils Absolute: 0 10*3/uL (ref 0.0–0.5)
HGB: 11.8 g/dL (ref 11.6–15.9)
MCV: 93.2 fL (ref 79.5–101.0)
MONO#: 0.5 10*3/uL (ref 0.1–0.9)
MONO%: 15 % — ABNORMAL HIGH (ref 0.0–14.0)
NEUT#: 2.3 10*3/uL (ref 1.5–6.5)
RDW: 16 % — ABNORMAL HIGH (ref 11.2–14.5)
WBC: 3.5 10*3/uL — ABNORMAL LOW (ref 3.9–10.3)

## 2011-08-18 LAB — COMPREHENSIVE METABOLIC PANEL
Albumin: 3.2 g/dL — ABNORMAL LOW (ref 3.5–5.2)
Alkaline Phosphatase: 50 U/L (ref 39–117)
BUN: 15 mg/dL (ref 6–23)
CO2: 28 mEq/L (ref 19–32)
Calcium: 9.3 mg/dL (ref 8.4–10.5)
Chloride: 107 mEq/L (ref 96–112)
Glucose, Bld: 105 mg/dL — ABNORMAL HIGH (ref 70–99)
Potassium: 3.5 mEq/L (ref 3.5–5.3)
Sodium: 142 mEq/L (ref 135–145)
Total Protein: 6.2 g/dL (ref 6.0–8.3)

## 2011-08-18 LAB — PROTIME-INR
INR: 2 (ref 2.00–3.50)
Protime: 24 Seconds — ABNORMAL HIGH (ref 10.6–13.4)

## 2011-08-18 NOTE — Telephone Encounter (Addendum)
RECEIVED A FAX FROM [facility] CONCERNING A PRESCRIPTION REFILL REQUEST FOR REVLIMID. THIS REQUEST WAS GIVEN TO DR.MOHAMED'S NURSE, STEPHANIE JOHNSON,RN. 

## 2011-08-18 NOTE — Progress Notes (Signed)
[facility] OFFICE PROGRESS NOTE  [name], MD, MD [address] [place] [address]  DIAGNOSIS:  1. multiple myeloma diagnosed in September 2009,  2. history of bilateral lower extremity deep vein thrombosis diagnosed in November 2009  3. acute on chronic deep vein thrombosis in the left lower extremity diagnosed in October 2010.   PRIOR THERAPY:  1. status post palliative radiotherapy to the right femur and is she'll tuberosity, first was done in December 2011 and the second treatment was completed Jan 08 2011, and the third treatment to the right distal femur completed on 04/30/2011  2. status post palliative radiotherapy to the left proximal femur completed 02/28/2011.   CURRENT THERAPY:  1. Revlimid 25 mg by mouth daily for 21 days every 4 weeks in addition to oral Decadron at 40 mg by mouth on a weekly basis status post 37 cycles.  2. Coumadin 5 mg on Mondays and Thursdays and 2.5 mg on all other days.  3. Zometa 4 mg IV given every 2 months.   INTERVAL HISTORY: [name] 75 y.o. female returns to the clinic today for a visit that time they have 2 daughters. The patient has feeling fine no specific complaints except for the pain from the lower back to the right lower extremity. She is currently on oxycodone 7.5 mg every 6 hours. She is not on any long-acting pain medication as the patient had developed confusion in the past with with long acting pain medication. She is tolerating her treatment with Revlimid fairly well. The patient has repeat CBC, comprehensive metabolic panel, LDH and myeloma panel performed recently and she is here today for evaluation and discussion of her lab results.  MEDICAL HISTORY: Past Medical History  Diagnosis Date  . Blood transfusion   . Depression     Mild  . History of chicken pox   . Hyperlipidemia   . Hypertension   . Degenerative joint disease   . Diabetes mellitus     Steroid related  . Phlebitis   .  Multiple myeloma     per Dr. [name], s/p palliative radiation for leg pain 2011 per Dr. Roselind Messier    ALLERGIES:   has no known allergies.  MEDICATIONS:  Current Outpatient Prescriptions  Medication Sig Dispense Refill  . ALPRAZolam (XANAX) 0.5 MG tablet TAKE ONE TABLET BY MOUTH TWICE DAILY  60 tablet  0  . Calcium Carbonate-Vitamin D 600-400 MG-UNIT per tablet Take 1 tablet by mouth 3 (three) times daily with meals.        Marland Kitchen dexamethasone (DECADRON) 4 MG tablet TAKE TEN TABLETS BY MOUTH ONCE A WEEK WITH FOOD AS DIRECTED  40 tablet  2  . glipiZIDE (GLUCOTROL XL) 2.5 MG 24 hr tablet TAKE ONE TABLET BY MOUTH EVERY DAY  90 tablet  2  . ibuprofen (ADVIL,MOTRIN) 200 MG tablet Take 200 mg by mouth every 6 (six) hours as needed.        Marland Kitchen KLOR-CON M20 20 MEQ tablet TAKE ONE TABLET BY MOUTH EVERY DAY  30 each  0  . Lancets (ONETOUCH ULTRASOFT) lancets Test blood sugar once daily or as directed.  100 each  11  . lenalidomide (REVLIMID) 25 MG capsule Take 1 capsule (25 mg total) by mouth daily. Take 1 capsule per day for 21 days every 28 days.  21 capsule  0  . magnesium oxide (MAG-OX) 400 MG tablet Take 400 mg by mouth daily.       Marland Kitchen  ONE TOUCH ULTRA TEST test strip TEST BLOOD SUGAR ONCE DAILY OR AS DIRECTED  25 each  6  . oxyCODONE (OXY IR/ROXICODONE) 5 MG immediate release tablet Take 1 1/2 tabs by mouth every 6 hours as needed for pain  90 tablet  0  . prochlorperazine (COMPAZINE) 10 MG tablet Take 1 tablet (10 mg total) by mouth every 6 (six) hours as needed.  30 tablet  0  . TEMAZEPAM PO Take 10 mg by mouth at bedtime.        Marland Kitchen warfarin (COUMADIN) 5 MG tablet Take 5 mg by mouth daily. 1 tab by mouth on Mondays and Thursdays, 1/2 tab by mouth on all other days      . warfarin (COUMADIN) 2 MG tablet Take 2 mg by mouth daily.          SURGICAL HISTORY:  Past Surgical History  Procedure Date  . Abdominal hysterectomy     REVIEW OF SYSTEMS:  A comprehensive review of systems was negative except  for: Musculoskeletal: positive for back pain   PHYSICAL EXAMINATION: General appearance: alert, cooperative and no distress Head: Normocephalic, without obvious abnormality, atraumatic Neck: no adenopathy Lymph nodes: Cervical, supraclavicular, and axillary nodes normal. Resp: clear to auscultation bilaterally Cardio: regular rate and rhythm, S1, S2 normal, no murmur, click, rub or gallop GI: soft, non-tender; bowel sounds normal; no masses,  no organomegaly Extremities: extremities normal, atraumatic, no cyanosis or edema Neurologic: Alert and oriented X 3, normal strength and tone. Normal symmetric reflexes. Normal coordination and gait  ECOG PERFORMANCE STATUS: 1 - Symptomatic but completely ambulatory  Blood pressure 146/76, pulse 66, temperature 97.6 F (36.4 C), temperature source Oral, height 5\' 1"  (1.549 m), weight 118 lb 6.4 oz (53.706 kg).  LABORATORY DATA: Lab Results  Component Value Date   WBC 3.5* 08/18/2011   HGB 11.8 08/18/2011   HCT 35.7 08/18/2011   MCV 93.2 08/18/2011   PLT 232 08/18/2011      Chemistry      Component Value Date/Time   NA 141 08/11/2011 1343   K 3.7 08/11/2011 1343   CL 107 08/11/2011 1343   CO2 25 08/11/2011 1343   BUN 16 08/11/2011 1343   CREATININE 0.93 08/11/2011 1343      Component Value Date/Time   CALCIUM 8.9 08/11/2011 1343   CALCIUM  Value: 5.7 CRITICAL RESULT CALLED TO, READ BACK BY AND VERIFIED WITH: JAMIE TRACY,RN [id number] @ 1433 BY J SCOTTON (NOTE)  Amended report. Result repeated and verified. CORRECTED ON 10/14 AT 1429: PREVIOUSLY REPORTED AS Result repeated and verified.* 06/13/2008 1605   ALKPHOS 39 08/11/2011 1343   AST 15 08/11/2011 1343   ALT 17 08/11/2011 1343   BILITOT 0.4 08/11/2011 1343     Other labs: Beta-2 microglobulin 1.67, free kappa light chain 5.52, free lambda light chain 18.00, and kappa/ lambda ratio 0.31, IgG 561, IgA 169 and IgM 134.    ASSESSMENT: This is a very pleasant 75 years old [place]  American female with a history of multiple myeloma. The patient has been on treatment with Revlimid for 38 cycles. Now she presents with some evidence for disease progression with increase and the free kappa and lambda. I discussed the lab result with the patient and her family.   PLAN:  #1 would discontinue the treatment with Revlimid at this point. #2 I will start the patient on weekly subcutaneous Velcade 1.3 mg/M2 in addition to weekly Decadron 40 mg by mouth. She will start the first  cycle of this treatment on 08/28/2011. #3 for pain management, the patient will continue on her current treatment with oxycodone. #4 for the history of deep venous thrombosis, the patient will continue on Coumadin 5 mg by mouth daily on Monday and Thursday and 2.5 mg on the other days. #5 the patient come back for followup visit in one month's I discussed with the patient the treatment plan in detail and they are in agreement with it.   All questions were answered. The patient knows to call the clinic with any problems, questions or concerns. We can certainly see the patient much sooner if necessary.  I spent 20 minutes counseling the patient face to face. The total time spent in the appointment was 40 minutes.

## 2011-08-18 NOTE — Telephone Encounter (Signed)
gve the pt's dtr the dec-jan 2013 appt calendar

## 2011-08-19 ENCOUNTER — Other Ambulatory Visit: Payer: Self-pay | Admitting: Internal Medicine

## 2011-08-19 DIAGNOSIS — G4700 Insomnia, unspecified: Secondary | ICD-10-CM

## 2011-08-20 ENCOUNTER — Encounter: Payer: Self-pay | Admitting: *Deleted

## 2011-08-20 NOTE — Progress Notes (Signed)
Pt is no longer on Revlimid per Dr Donnald Garre, called [facility] to cancel rx.  SLJ

## 2011-08-28 ENCOUNTER — Other Ambulatory Visit (HOSPITAL_BASED_OUTPATIENT_CLINIC_OR_DEPARTMENT_OTHER): Payer: Medicare Other | Admitting: Lab

## 2011-08-28 ENCOUNTER — Ambulatory Visit (HOSPITAL_BASED_OUTPATIENT_CLINIC_OR_DEPARTMENT_OTHER): Payer: Medicare Other

## 2011-08-28 ENCOUNTER — Other Ambulatory Visit: Payer: Self-pay | Admitting: Internal Medicine

## 2011-08-28 VITALS — BP 178/86 | HR 58 | Temp 97.6°F

## 2011-08-28 DIAGNOSIS — C9000 Multiple myeloma not having achieved remission: Secondary | ICD-10-CM

## 2011-08-28 DIAGNOSIS — Z5112 Encounter for antineoplastic immunotherapy: Secondary | ICD-10-CM

## 2011-08-28 DIAGNOSIS — Z7901 Long term (current) use of anticoagulants: Secondary | ICD-10-CM

## 2011-08-28 LAB — COMPREHENSIVE METABOLIC PANEL
AST: 15 U/L (ref 0–37)
Albumin: 3.5 g/dL (ref 3.5–5.2)
Alkaline Phosphatase: 42 U/L (ref 39–117)
BUN: 15 mg/dL (ref 6–23)
Calcium: 9.3 mg/dL (ref 8.4–10.5)
Creatinine, Ser: 1.07 mg/dL (ref 0.50–1.10)
Glucose, Bld: 86 mg/dL (ref 70–99)
Potassium: 3.9 mEq/L (ref 3.5–5.3)

## 2011-08-28 LAB — CBC WITH DIFFERENTIAL/PLATELET
BASO%: 3.2 % — ABNORMAL HIGH (ref 0.0–2.0)
Basophils Absolute: 0.1 10*3/uL (ref 0.0–0.1)
EOS%: 7.1 % — ABNORMAL HIGH (ref 0.0–7.0)
HCT: 34.7 % — ABNORMAL LOW (ref 34.8–46.6)
HGB: 11 g/dL — ABNORMAL LOW (ref 11.6–15.9)
LYMPH%: 22.6 % (ref 14.0–49.7)
MCH: 30 pg (ref 25.1–34.0)
MCHC: 31.7 g/dL (ref 31.5–36.0)
MCV: 94.6 fL (ref 79.5–101.0)
NEUT%: 53.7 % (ref 38.4–76.8)
Platelets: 168 10*3/uL (ref 145–400)
lymph#: 0.6 10*3/uL — ABNORMAL LOW (ref 0.9–3.3)

## 2011-08-28 MED ORDER — BORTEZOMIB CHEMO IV INJECTION 3.5 MG
1.3000 mg/m2 | Freq: Once | INTRAMUSCULAR | Status: AC
  Administered 2011-08-28: 2 mg via INTRAVENOUS
  Filled 2011-08-28: qty 2

## 2011-08-28 MED ORDER — ONDANSETRON 8 MG/50ML IVPB (CHCC)
8.0000 mg | Freq: Once | INTRAVENOUS | Status: AC
  Administered 2011-08-28: 8 mg via INTRAVENOUS

## 2011-08-28 MED ORDER — SODIUM CHLORIDE 0.9 % IV SOLN: 8.0000 mg | Freq: Once | INTRAVENOUS | Status: DC

## 2011-08-28 MED ORDER — OXYCODONE HCL 5 MG PO TABS: ORAL_TABLET | ORAL | Status: DC

## 2011-08-28 MED ORDER — SODIUM CHLORIDE 0.9 % IV SOLN
Freq: Once | INTRAVENOUS | Status: AC
  Administered 2011-08-28: 14:00:00 via INTRAVENOUS

## 2011-08-28 NOTE — Progress Notes (Signed)
1330--per Dr Donnald Garre, okay to tx with WBC 2.8.  SLJ

## 2011-08-29 ENCOUNTER — Telehealth: Payer: Self-pay | Admitting: Internal Medicine

## 2011-08-29 ENCOUNTER — Telehealth: Payer: Self-pay | Admitting: *Deleted

## 2011-08-29 NOTE — Telephone Encounter (Signed)
Spoke with [name] who says she is feeling good.  Denies any adverse effects from chemotherapy.  Doesn't eat much normally anyway and denies any stomach cramps or pain.  Encouraged to call if any changes occur or any questions.

## 2011-08-29 NOTE — Telephone Encounter (Signed)
LMOVM of [name] (daughter) that called.

## 2011-08-29 NOTE — Telephone Encounter (Signed)
Message copied by Augusto Garbe on Fri Aug 29, 2011 11:41 AM ------      Message from: Caren Griffins      Created: Thu Aug 28, 2011  3:00 PM      Regarding: chemo f/u call      Contact: [phone]       1st time subq velcade, Dr Donnald Garre.  SLJ

## 2011-08-29 NOTE — Telephone Encounter (Signed)
Patient was taken off of her Blood Pressure Lisinopril and for the last 3 days her blood pressure has continuously been elevated.  Between 150-190 systolic, and the diastolic has been in the 80's. She wanted to know if she should start back on the Lisinopril.

## 2011-08-29 NOTE — Telephone Encounter (Signed)
This should come through the MD that took her off the lisinopril. Please have them address it.  Thanks.

## 2011-09-04 ENCOUNTER — Other Ambulatory Visit: Payer: Self-pay | Admitting: Pharmacist

## 2011-09-04 ENCOUNTER — Other Ambulatory Visit: Payer: Self-pay | Admitting: *Deleted

## 2011-09-04 ENCOUNTER — Other Ambulatory Visit: Payer: Self-pay | Admitting: Internal Medicine

## 2011-09-04 ENCOUNTER — Encounter: Payer: Self-pay | Admitting: *Deleted

## 2011-09-04 ENCOUNTER — Encounter: Payer: Self-pay | Admitting: Pharmacist

## 2011-09-04 ENCOUNTER — Other Ambulatory Visit (HOSPITAL_BASED_OUTPATIENT_CLINIC_OR_DEPARTMENT_OTHER): Payer: Medicare Other

## 2011-09-04 ENCOUNTER — Ambulatory Visit (HOSPITAL_BASED_OUTPATIENT_CLINIC_OR_DEPARTMENT_OTHER): Payer: Medicare Other

## 2011-09-04 VITALS — BP 147/75 | HR 63 | Temp 98.0°F

## 2011-09-04 DIAGNOSIS — Z5112 Encounter for antineoplastic immunotherapy: Secondary | ICD-10-CM

## 2011-09-04 DIAGNOSIS — I82409 Acute embolism and thrombosis of unspecified deep veins of unspecified lower extremity: Secondary | ICD-10-CM

## 2011-09-04 DIAGNOSIS — C9000 Multiple myeloma not having achieved remission: Secondary | ICD-10-CM

## 2011-09-04 LAB — CBC WITH DIFFERENTIAL/PLATELET
BASO%: 2.1 % — ABNORMAL HIGH (ref 0.0–2.0)
LYMPH%: 22.5 % (ref 14.0–49.7)
MCHC: 32.1 g/dL (ref 31.5–36.0)
MONO#: 0.4 10*3/uL (ref 0.1–0.9)
RBC: 4.16 10*6/uL (ref 3.70–5.45)
RDW: 14.8 % — ABNORMAL HIGH (ref 11.2–14.5)
WBC: 3.3 10*3/uL — ABNORMAL LOW (ref 3.9–10.3)
lymph#: 0.8 10*3/uL — ABNORMAL LOW (ref 0.9–3.3)
nRBC: 0 % (ref 0–0)

## 2011-09-04 LAB — BASIC METABOLIC PANEL
Potassium: 4.2 mEq/L (ref 3.5–5.3)
Sodium: 140 mEq/L (ref 135–145)

## 2011-09-04 LAB — PROTIME-INR
INR: 1.2 — ABNORMAL LOW (ref 2.00–3.50)
Protime: 14.4 Seconds — ABNORMAL HIGH (ref 10.6–13.4)

## 2011-09-04 MED ORDER — ONDANSETRON HCL 4 MG/2ML IJ SOLN
8.0000 mg | Freq: Once | INTRAMUSCULAR | Status: DC
  Administered 2011-09-04: 8 mg via INTRAVENOUS

## 2011-09-04 MED ORDER — ONDANSETRON 8 MG/50ML IVPB (CHCC)
8.0000 mg | Freq: Once | INTRAVENOUS | Status: AC
  Administered 2011-09-04: 8 mg via INTRAVENOUS

## 2011-09-04 MED ORDER — SODIUM CHLORIDE 0.9 % IV SOLN
Freq: Once | INTRAVENOUS | Status: AC
  Administered 2011-09-04: 12:00:00 via INTRAVENOUS

## 2011-09-04 MED ORDER — BORTEZOMIB CHEMO IV INJECTION 3.5 MG
1.3000 mg/m2 | Freq: Once | INTRAMUSCULAR | Status: AC
  Administered 2011-09-04: 2 mg via INTRAVENOUS
  Filled 2011-09-04: qty 2

## 2011-09-04 NOTE — Progress Notes (Signed)
error    This encounter was created in error - please disregard.

## 2011-09-04 NOTE — Progress Notes (Signed)
INR 1.2.  Per Dr Donnald Garre, pt needs to begin taking coumadin 5mg  daily, referral made to the coumadin clinic.  Referral given to Ebony Hail PharmD.  Informed infusion RN to let pt know to begin taking coumadin 5mg  daily.  SLJ

## 2011-09-04 NOTE — Patient Instructions (Signed)
Pt tolerated chemo without difficulty.   Kolleen,  Coumadin clinic pharmacist saw pt in infusion room today.    Pt to take Coumadin 5 mg po daily  As per Judeth Cornfield, RN/Dr. [name].  Kolleen agreed with md's instructions.    Pt to have labs rechecked on 09/11/11.    Pt/daughter Selena Batten  Verbalized understanding. Mena Pauls   Phone        [phone].

## 2011-09-04 NOTE — Progress Notes (Unsigned)
New patient from Dr. [name] referred to coumadin clinic.  Pt INR today in infusion was 1.20.  Thu instructed patient to increase dose to 5 mg daily and we will recheck her in infusion on 09/11/11.  She was previously on coumadin 2.5mg  daily except 5 mg on Mon and Thur.

## 2011-09-05 ENCOUNTER — Telehealth: Payer: Self-pay | Admitting: Family Medicine

## 2011-09-05 NOTE — Telephone Encounter (Signed)
Patient's daughter advised.  

## 2011-09-05 NOTE — Telephone Encounter (Signed)
Questions about blood pressure and medication changes.  Blood pressure has been elevated.  (Caller was disconnected before end of call/voice mail).  Please call back

## 2011-09-05 NOTE — Telephone Encounter (Signed)
Sue Lush says that you had discontinued the patient's BP med but her BP continued to elevate and was found to be 198/102 at the hospital so they restarted the BP medication.  Now it is a bit low at 111/70.  Should they cut the pill in half?  Please advise.

## 2011-09-05 NOTE — Telephone Encounter (Signed)
Please verify the med and dose (lisinopril 20/12.5).  It would be okay to cut the dose in half and then follow BP, call back if BP >140/90 next week.  Thanks.

## 2011-09-11 ENCOUNTER — Other Ambulatory Visit: Payer: Self-pay | Admitting: Internal Medicine

## 2011-09-11 ENCOUNTER — Other Ambulatory Visit: Payer: Medicare Other | Admitting: Lab

## 2011-09-11 ENCOUNTER — Ambulatory Visit (HOSPITAL_BASED_OUTPATIENT_CLINIC_OR_DEPARTMENT_OTHER): Payer: Medicare Other

## 2011-09-11 ENCOUNTER — Encounter: Payer: Self-pay | Admitting: Internal Medicine

## 2011-09-11 ENCOUNTER — Other Ambulatory Visit: Payer: Self-pay | Admitting: Pharmacist

## 2011-09-11 VITALS — BP 151/78 | HR 61 | Temp 97.8°F

## 2011-09-11 DIAGNOSIS — I82409 Acute embolism and thrombosis of unspecified deep veins of unspecified lower extremity: Secondary | ICD-10-CM

## 2011-09-11 DIAGNOSIS — C9000 Multiple myeloma not having achieved remission: Secondary | ICD-10-CM

## 2011-09-11 DIAGNOSIS — Z5112 Encounter for antineoplastic immunotherapy: Secondary | ICD-10-CM

## 2011-09-11 LAB — CBC WITH DIFFERENTIAL/PLATELET
Basophils Absolute: 0 10*3/uL (ref 0.0–0.1)
Eosinophils Absolute: 0.2 10*3/uL (ref 0.0–0.5)
HCT: 37.1 % (ref 34.8–46.6)
HGB: 12.1 g/dL (ref 11.6–15.9)
LYMPH%: 19.5 % (ref 14.0–49.7)
MCV: 92.3 fL (ref 79.5–101.0)
MONO%: 14.7 % — ABNORMAL HIGH (ref 0.0–14.0)
NEUT#: 1.9 10*3/uL (ref 1.5–6.5)
Platelets: 175 10*3/uL (ref 145–400)
RDW: 14.6 % — ABNORMAL HIGH (ref 11.2–14.5)

## 2011-09-11 LAB — BASIC METABOLIC PANEL
CO2: 27 mEq/L (ref 19–32)
Chloride: 106 mEq/L (ref 96–112)
Glucose, Bld: 96 mg/dL (ref 70–99)
Potassium: 3.7 mEq/L (ref 3.5–5.3)
Sodium: 140 mEq/L (ref 135–145)

## 2011-09-11 MED ORDER — ZOLEDRONIC ACID 4 MG/100ML IV SOLN
4.0000 mg | Freq: Once | INTRAVENOUS | Status: DC
  Filled 2011-09-11: qty 100

## 2011-09-11 MED ORDER — ONDANSETRON 8 MG/50ML IVPB (CHCC)
8.0000 mg | Freq: Once | INTRAVENOUS | Status: AC
  Administered 2011-09-11: 8 mg via INTRAVENOUS

## 2011-09-11 MED ORDER — SODIUM CHLORIDE 0.9 % IV SOLN: Freq: Once | INTRAVENOUS | Status: DC

## 2011-09-11 MED ORDER — SODIUM CHLORIDE 0.9 % IV SOLN
Freq: Once | INTRAVENOUS | Status: AC
  Administered 2011-09-11: 15:00:00 via INTRAVENOUS

## 2011-09-11 MED ORDER — SODIUM CHLORIDE 0.9 % IJ SOLN
3.0000 mL | Freq: Once | INTRAMUSCULAR | Status: DC | PRN
  Filled 2011-09-11: qty 10

## 2011-09-11 MED ORDER — BORTEZOMIB CHEMO IV INJECTION 3.5 MG
1.3000 mg/m2 | Freq: Once | INTRAMUSCULAR | Status: AC
  Administered 2011-09-11: 2 mg via INTRAVENOUS
  Filled 2011-09-11: qty 2

## 2011-09-11 MED FILL — Ondansetron HCl Inj 40 MG/20ML (2 MG/ML): INTRAVENOUS | Qty: 8 | Status: AC

## 2011-09-11 NOTE — Progress Notes (Signed)
I was asked by Dixie to talk to pts daughter in the infusion room who states that Lu  just wants to sit or lie down everyday since  the first Velcade. Melesa states she has no appetite . I told the pt/daughter  that her labs/VS  were good and gave the daughter some examples of high protein foods and shakes. I also told the daughter to please call us for any concerns after this treatment today. I asked Sahej if she wanted to take chemo today and she said she did.

## 2011-09-12 ENCOUNTER — Other Ambulatory Visit: Payer: Self-pay | Admitting: Internal Medicine

## 2011-09-12 DIAGNOSIS — C9000 Multiple myeloma not having achieved remission: Secondary | ICD-10-CM

## 2011-09-12 MED ORDER — OXYCODONE HCL 5 MG PO TABS: ORAL_TABLET | ORAL | Status: DC

## 2011-09-12 NOTE — Telephone Encounter (Signed)
Sue Lush called requesting refill for oxycodone 5 mg . Rx written and sent to front for pickup

## 2011-09-15 ENCOUNTER — Other Ambulatory Visit: Payer: Self-pay | Admitting: Internal Medicine

## 2011-09-16 ENCOUNTER — Other Ambulatory Visit: Payer: Self-pay | Admitting: Internal Medicine

## 2011-09-16 DIAGNOSIS — F419 Anxiety disorder, unspecified: Secondary | ICD-10-CM

## 2011-09-16 NOTE — Telephone Encounter (Signed)
Xanax was approved yesterday

## 2011-09-16 NOTE — Telephone Encounter (Signed)
Xanax was approved yesterday by Dr Donnald Garre

## 2011-09-17 ENCOUNTER — Other Ambulatory Visit: Payer: Self-pay | Admitting: Pharmacist

## 2011-09-17 DIAGNOSIS — I82409 Acute embolism and thrombosis of unspecified deep veins of unspecified lower extremity: Secondary | ICD-10-CM

## 2011-09-18 ENCOUNTER — Other Ambulatory Visit (HOSPITAL_BASED_OUTPATIENT_CLINIC_OR_DEPARTMENT_OTHER): Payer: Medicare Other | Admitting: Internal Medicine

## 2011-09-18 ENCOUNTER — Ambulatory Visit: Payer: Self-pay | Admitting: Pharmacist

## 2011-09-18 ENCOUNTER — Ambulatory Visit (HOSPITAL_BASED_OUTPATIENT_CLINIC_OR_DEPARTMENT_OTHER): Payer: Medicare Other

## 2011-09-18 ENCOUNTER — Other Ambulatory Visit (HOSPITAL_BASED_OUTPATIENT_CLINIC_OR_DEPARTMENT_OTHER): Payer: Medicare Other | Admitting: Lab

## 2011-09-18 ENCOUNTER — Telehealth: Payer: Self-pay | Admitting: Internal Medicine

## 2011-09-18 ENCOUNTER — Other Ambulatory Visit: Payer: Self-pay | Admitting: *Deleted

## 2011-09-18 ENCOUNTER — Ambulatory Visit (HOSPITAL_BASED_OUTPATIENT_CLINIC_OR_DEPARTMENT_OTHER): Payer: Medicare Other | Admitting: Internal Medicine

## 2011-09-18 VITALS — BP 129/81 | HR 74 | Temp 98.3°F | Ht 61.0 in | Wt 120.1 lb

## 2011-09-18 DIAGNOSIS — C9000 Multiple myeloma not having achieved remission: Secondary | ICD-10-CM

## 2011-09-18 DIAGNOSIS — F419 Anxiety disorder, unspecified: Secondary | ICD-10-CM

## 2011-09-18 DIAGNOSIS — R5383 Other fatigue: Secondary | ICD-10-CM

## 2011-09-18 DIAGNOSIS — I82409 Acute embolism and thrombosis of unspecified deep veins of unspecified lower extremity: Secondary | ICD-10-CM

## 2011-09-18 DIAGNOSIS — M549 Dorsalgia, unspecified: Secondary | ICD-10-CM

## 2011-09-18 DIAGNOSIS — Z5112 Encounter for antineoplastic immunotherapy: Secondary | ICD-10-CM

## 2011-09-18 LAB — BASIC METABOLIC PANEL
BUN: 14 mg/dL (ref 6–23)
Calcium: 9.7 mg/dL (ref 8.4–10.5)
Glucose, Bld: 70 mg/dL (ref 70–99)
Potassium: 4.5 mEq/L (ref 3.5–5.3)

## 2011-09-18 LAB — CBC WITH DIFFERENTIAL/PLATELET
Basophils Absolute: 0 10*3/uL (ref 0.0–0.1)
EOS%: 2.3 % (ref 0.0–7.0)
Eosinophils Absolute: 0.1 10*3/uL (ref 0.0–0.5)
HGB: 11.8 g/dL (ref 11.6–15.9)
LYMPH%: 14.6 % (ref 14.0–49.7)
MCH: 29.6 pg (ref 25.1–34.0)
MCV: 91.5 fL (ref 79.5–101.0)
MONO%: 14.1 % — ABNORMAL HIGH (ref 0.0–14.0)
NEUT#: 2.4 10*3/uL (ref 1.5–6.5)
NEUT%: 68.4 % (ref 38.4–76.8)
Platelets: 175 10*3/uL (ref 145–400)

## 2011-09-18 LAB — PROTIME-INR: Protime: 25.2 Seconds — ABNORMAL HIGH (ref 10.6–13.4)

## 2011-09-18 MED ORDER — SODIUM CHLORIDE 0.9 % IV SOLN
Freq: Once | INTRAVENOUS | Status: AC
  Administered 2011-09-18: 13:00:00 via INTRAVENOUS

## 2011-09-18 MED ORDER — ONDANSETRON 8 MG/50ML IVPB (CHCC)
8.0000 mg | Freq: Once | INTRAVENOUS | Status: AC
  Administered 2011-09-18: 8 mg via INTRAVENOUS

## 2011-09-18 MED ORDER — ALPRAZOLAM 0.5 MG PO TABS: 0.5000 mg | ORAL_TABLET | Freq: Three times a day (TID) | ORAL | Status: DC | PRN

## 2011-09-18 MED ORDER — BORTEZOMIB CHEMO IV INJECTION 3.5 MG
1.3000 mg/m2 | Freq: Once | INTRAMUSCULAR | Status: AC
  Administered 2011-09-18: 2 mg via INTRAVENOUS
  Filled 2011-09-18: qty 2

## 2011-09-18 NOTE — Patient Instructions (Signed)
Patient ambulatory out of clinic without complaints accompanied by daughter.  Instructed patient to call with any issues.  Patient aware of next appointment

## 2011-09-18 NOTE — Progress Notes (Signed)
[facility] OFFICE PROGRESS NOTE  [name], MD, MD [address] [place] [address]  DIAGNOSIS:  1. multiple myeloma diagnosed in September 2009,  2. history of bilateral lower extremity deep vein thrombosis diagnosed in November 2009  3. acute on chronic deep vein thrombosis in the left lower extremity diagnosed in October 2010.  PRIOR THERAPY:  1. status post palliative radiotherapy to the right femur and is she'll tuberosity, first was done in December 2011 and the second treatment was completed Jan 08 2011, and the third treatment to the right distal femur completed on 04/30/2011  2. status post palliative radiotherapy to the left proximal femur completed 02/28/2011. 3. Revlimid 25 mg by mouth daily for 21 days every 4 weeks in addition to oral Decadron at 40 mg by mouth on a weekly basis status post 37 cycles.   CURRENT THERAPY:  1. weekly subcutaneous Velcade 1.3 mg/M2 status post 3 cycles. 2. Coumadin 5 mg on Mondays and Thursdays and 2.5 mg on all other days.  3. Zometa 4 mg IV given every 2 months.   INTERVAL HISTORY: [name] 76 y.o. female returns to the clinic today for followup visit accompanied by her daughter. The patient tolerated the first 3 weekly doses of that it fairly well. She continues to have pain in the lower back with radiation to the lower extremities. She denied having any significant nausea or vomiting, no chest pain or shortness of breath, no cough or hemoptysis. She has no weight loss. The patient has been on Coumadin 5 mg by mouth daily for her history of deep venous thrombosis.  MEDICAL HISTORY: Past Medical History  Diagnosis Date  . Blood transfusion   . Depression     Mild  . History of chicken pox   . Hyperlipidemia   . Hypertension   . Degenerative joint disease   . Diabetes mellitus     Steroid related  . Phlebitis   . Multiple myeloma     per Dr. [name], s/p palliative radiation for leg pain 2011 per Dr.  Roselind Messier    ALLERGIES:   has no known allergies.  MEDICATIONS:  Current Outpatient Prescriptions  Medication Sig Dispense Refill  . ALPRAZolam (XANAX) 0.5 MG tablet TAKE ONE TABLET BY MOUTH TWICE DAILY  30 tablet  0  . Calcium Carbonate-Vitamin D 600-400 MG-UNIT per tablet Take 1 tablet by mouth 3 (three) times daily with meals.        Marland Kitchen dexamethasone (DECADRON) 4 MG tablet TAKE TEN TABLETS BY MOUTH ONCE A WEEK WITH FOOD AS DIRECTED  40 tablet  2  . glipiZIDE (GLUCOTROL XL) 2.5 MG 24 hr tablet TAKE ONE TABLET BY MOUTH EVERY DAY  90 tablet  2  . ibuprofen (ADVIL,MOTRIN) 200 MG tablet Take 200 mg by mouth every 6 (six) hours as needed.        Marland Kitchen KLOR-CON M20 20 MEQ tablet TAKE ONE TABLET BY MOUTH EVERY DAY  30 each  0  . Lancets (ONETOUCH ULTRASOFT) lancets Test blood sugar once daily or as directed.  100 each  11  . LISINOPRIL PO Take by mouth. 1/2 tab daily      . magnesium oxide (MAG-OX) 400 MG tablet Take 400 mg by mouth daily.       . ONE TOUCH ULTRA TEST test strip TEST BLOOD SUGAR ONCE DAILY OR AS DIRECTED  25 each  6  . oxyCODONE (OXY IR/ROXICODONE) 5 MG immediate release tablet Take 1 1/2  tabs by mouth every 6 hours as needed for pain  90 tablet  0  . prochlorperazine (COMPAZINE) 10 MG tablet Take 1 tablet (10 mg total) by mouth every 6 (six) hours as needed.  30 tablet  0  . temazepam (RESTORIL) 30 MG capsule TAKE ONE CAPSULE BY MOUTH AT BEDTIME AS NEEDED FOR  INSOMNIA  30 capsule  0  . TEMAZEPAM PO Take 10 mg by mouth at bedtime.        Marland Kitchen warfarin (COUMADIN) 2 MG tablet Take 2 mg by mouth daily.        Marland Kitchen warfarin (COUMADIN) 5 MG tablet Take 5 mg by mouth daily. 1 tab by mouth on Mondays and Thursdays, 1/2 tab by mouth on all other days        SURGICAL HISTORY:  Past Surgical History  Procedure Date  . Abdominal hysterectomy     REVIEW OF SYSTEMS:  A comprehensive review of systems was negative except for: Constitutional: positive for fatigue Musculoskeletal: positive for back  pain   PHYSICAL EXAMINATION: General appearance: alert, cooperative and no distress Resp: clear to auscultation bilaterally Cardio: regular rate and rhythm, S1, S2 normal, no murmur, click, rub or gallop GI: soft, non-tender; bowel sounds normal; no masses,  no organomegaly Extremities: extremities normal, atraumatic, no cyanosis or edema  ECOG PERFORMANCE STATUS: 1 - Symptomatic but completely ambulatory  Blood pressure 129/81, pulse 74, temperature 98.3 F (36.8 C), temperature source Oral, height 5\' 1"  (1.549 m), weight 120 lb 1.6 oz (54.477 kg).  LABORATORY DATA: Lab Results  Component Value Date   WBC 3.6* 09/18/2011   HGB 11.8 09/18/2011   HCT 36.5 09/18/2011   MCV 91.5 09/18/2011   PLT 175 09/18/2011      Chemistry      Component Value Date/Time   NA 140 09/11/2011 1341   K 3.7 09/11/2011 1341   CL 106 09/11/2011 1341   CO2 27 09/11/2011 1341   BUN 14 09/11/2011 1341   CREATININE 0.93 09/11/2011 1341      Component Value Date/Time   CALCIUM 9.1 09/11/2011 1341   CALCIUM  Value: 5.7 CRITICAL RESULT CALLED TO, READ BACK BY AND VERIFIED WITH: JAMIE TRACY,RN [id number] @ 1433 BY J SCOTTON (NOTE)  Amended report. Result repeated and verified. CORRECTED ON 10/14 AT 1429: PREVIOUSLY REPORTED AS Result repeated and verified.* 06/13/2008 1605   ALKPHOS 42 08/28/2011 1257   AST 15 08/28/2011 1257   ALT 11 08/28/2011 1257   BILITOT 0.2* 08/28/2011 1257       RADIOGRAPHIC STUDIES: No results found.  ASSESSMENT: This is a very pleasant 76 years old [place] American female with a history of multiple myeloma currently on treatment with weekly subcutaneous Velcade with weekly Decadron 40 mg by mouth, status post 3 cycles. The patient is tolerating her treatment fairly well but she continues to have low back.  PLAN: I recommended for her to continue his current treatment with Velcade and Decadron. She would have repeat myeloma panel after 9 weekly doses. For the back pain, I ordered an MRI of  the lumbar spine. She will continue on OxyContin and oxycodone for pain management. The patient come back for followup visit in 3 weeks for reevaluation.  All questions were answered. The patient knows to call the clinic with any problems, questions or concerns. We can certainly see the patient much sooner if necessary.

## 2011-09-18 NOTE — Telephone Encounter (Signed)
Gv pt appt for jan-feb2013.  scheduled pt for mri for 01/22 @ 3pm @ [facility]

## 2011-09-18 NOTE — Progress Notes (Unsigned)
Saw pt and daughter in the infusion area.  No complaints per daughter.  No changes in meds or diet.  Answered some of the daughter's questions about factors that can increase or decrease the INR.  Discussed food and drug interactions at length.  Provided handout: Warfarin and your diet.    INR therapeutic today (2.1).  Will continue current dose of 5mg  daily and recheck INR in 2 weeks.  Will visit with her and her daughter in the infusion area on 10/02/11.

## 2011-09-22 ENCOUNTER — Other Ambulatory Visit: Payer: Self-pay

## 2011-09-22 ENCOUNTER — Other Ambulatory Visit: Payer: Self-pay | Admitting: Internal Medicine

## 2011-09-22 DIAGNOSIS — G4700 Insomnia, unspecified: Secondary | ICD-10-CM

## 2011-09-22 MED ORDER — TEMAZEPAM 30 MG PO CAPS: 30.0000 mg | ORAL_CAPSULE | Freq: Every evening | ORAL | Status: DC | PRN

## 2011-09-22 NOTE — Telephone Encounter (Signed)
Called in [employer] rx and to [place]

## 2011-09-23 ENCOUNTER — Ambulatory Visit (HOSPITAL_COMMUNITY)
Admission: RE | Admit: 2011-09-23 | Discharge: 2011-09-23 | Disposition: A | Discharge status: Home / Self Care | Payer: Medicare Other | Source: Ambulatory Visit | Attending: Internal Medicine | Admitting: Internal Medicine

## 2011-09-23 DIAGNOSIS — M47817 Spondylosis without myelopathy or radiculopathy, lumbosacral region: Secondary | ICD-10-CM | POA: Insufficient documentation

## 2011-09-23 DIAGNOSIS — M79609 Pain in unspecified limb: Secondary | ICD-10-CM | POA: Insufficient documentation

## 2011-09-23 DIAGNOSIS — C9000 Multiple myeloma not having achieved remission: Secondary | ICD-10-CM | POA: Insufficient documentation

## 2011-09-23 DIAGNOSIS — M549 Dorsalgia, unspecified: Secondary | ICD-10-CM | POA: Insufficient documentation

## 2011-09-23 DIAGNOSIS — M899 Disorder of bone, unspecified: Secondary | ICD-10-CM | POA: Insufficient documentation

## 2011-09-23 DIAGNOSIS — M949 Disorder of cartilage, unspecified: Secondary | ICD-10-CM | POA: Insufficient documentation

## 2011-09-23 DIAGNOSIS — M8448XA Pathological fracture, other site, initial encounter for fracture: Secondary | ICD-10-CM | POA: Insufficient documentation

## 2011-09-23 MED ORDER — GADOBENATE DIMEGLUMINE 529 MG/ML IV SOLN
11.0000 mL | Freq: Once | INTRAVENOUS | Status: AC | PRN
  Administered 2011-09-23: 11 mL via INTRAVENOUS

## 2011-09-25 ENCOUNTER — Other Ambulatory Visit: Payer: Self-pay | Admitting: Physician Assistant

## 2011-09-25 ENCOUNTER — Ambulatory Visit (HOSPITAL_BASED_OUTPATIENT_CLINIC_OR_DEPARTMENT_OTHER): Payer: Medicare Other

## 2011-09-25 ENCOUNTER — Other Ambulatory Visit: Payer: Medicare Other

## 2011-09-25 VITALS — BP 121/75 | HR 82 | Temp 98.2°F

## 2011-09-25 DIAGNOSIS — Z5112 Encounter for antineoplastic immunotherapy: Secondary | ICD-10-CM

## 2011-09-25 DIAGNOSIS — C9000 Multiple myeloma not having achieved remission: Secondary | ICD-10-CM

## 2011-09-25 DIAGNOSIS — I82409 Acute embolism and thrombosis of unspecified deep veins of unspecified lower extremity: Secondary | ICD-10-CM

## 2011-09-25 LAB — COMPREHENSIVE METABOLIC PANEL
AST: 19 U/L (ref 0–37)
Alkaline Phosphatase: 51 U/L (ref 39–117)
BUN: 15 mg/dL (ref 6–23)
Calcium: 9 mg/dL (ref 8.4–10.5)
Chloride: 105 mEq/L (ref 96–112)
Creatinine, Ser: 1.19 mg/dL — ABNORMAL HIGH (ref 0.50–1.10)

## 2011-09-25 LAB — CBC WITH DIFFERENTIAL/PLATELET
BASO%: 0.4 % (ref 0.0–2.0)
EOS%: 1.3 % (ref 0.0–7.0)
HCT: 37.3 % (ref 34.8–46.6)
LYMPH%: 12.3 % — ABNORMAL LOW (ref 14.0–49.7)
MCH: 29.6 pg (ref 25.1–34.0)
MCHC: 32.2 g/dL (ref 31.5–36.0)
MCV: 92.1 fL (ref 79.5–101.0)
NEUT%: 74.8 % (ref 38.4–76.8)
Platelets: 207 10*3/uL (ref 145–400)

## 2011-09-25 MED ORDER — SODIUM CHLORIDE 0.9 % IV SOLN: Freq: Once | INTRAVENOUS | Status: DC

## 2011-09-25 MED ORDER — BORTEZOMIB CHEMO IV INJECTION 3.5 MG
1.3000 mg/m2 | Freq: Once | INTRAMUSCULAR | Status: AC
  Administered 2011-09-25: 2 mg via INTRAVENOUS
  Filled 2011-09-25: qty 2

## 2011-09-25 MED ORDER — ONDANSETRON 8 MG/50ML IVPB (CHCC)
8.0000 mg | Freq: Once | INTRAVENOUS | Status: AC
  Administered 2011-09-25: 8 mg via INTRAVENOUS

## 2011-09-29 ENCOUNTER — Other Ambulatory Visit: Payer: Self-pay | Admitting: *Deleted

## 2011-09-29 DIAGNOSIS — C9000 Multiple myeloma not having achieved remission: Secondary | ICD-10-CM

## 2011-09-29 MED ORDER — OXYCODONE HCL 5 MG PO TABS: ORAL_TABLET | ORAL | Status: DC

## 2011-10-01 ENCOUNTER — Telehealth: Payer: Self-pay | Admitting: *Deleted

## 2011-10-01 ENCOUNTER — Encounter: Payer: Self-pay | Admitting: *Deleted

## 2011-10-01 NOTE — Telephone Encounter (Signed)
Pt's daughter Sue Lush called stating that pt is having increased pain and is currently taking 5mg  oxycodone 1 1/2 tabs q6h.  Per Dr Donnald Garre, okay to restart 10mg  oxycontin BID and take oxycodone 5mg  prn.  Sue Lush stated they still have oxycontin left over from when she took it previously.  She also stated that they received a letter that her insurance will not pay for a refill of oxycontin.  Asked Sue Lush to take letter to the medical mgmt team at [facility] to investigate options for her long acting pain medication before she runs out of her oxycontin.  SLJ

## 2011-10-02 ENCOUNTER — Ambulatory Visit: Payer: Self-pay | Admitting: Pharmacist

## 2011-10-02 ENCOUNTER — Other Ambulatory Visit: Payer: Self-pay | Admitting: Physician Assistant

## 2011-10-02 ENCOUNTER — Other Ambulatory Visit (HOSPITAL_BASED_OUTPATIENT_CLINIC_OR_DEPARTMENT_OTHER): Payer: Medicare Other | Admitting: Lab

## 2011-10-02 ENCOUNTER — Other Ambulatory Visit: Payer: Self-pay | Admitting: Pharmacist

## 2011-10-02 ENCOUNTER — Ambulatory Visit (HOSPITAL_BASED_OUTPATIENT_CLINIC_OR_DEPARTMENT_OTHER): Payer: Medicare Other

## 2011-10-02 DIAGNOSIS — C9000 Multiple myeloma not having achieved remission: Secondary | ICD-10-CM

## 2011-10-02 DIAGNOSIS — Z7901 Long term (current) use of anticoagulants: Secondary | ICD-10-CM

## 2011-10-02 DIAGNOSIS — Z5112 Encounter for antineoplastic immunotherapy: Secondary | ICD-10-CM

## 2011-10-02 DIAGNOSIS — I82409 Acute embolism and thrombosis of unspecified deep veins of unspecified lower extremity: Secondary | ICD-10-CM

## 2011-10-02 DIAGNOSIS — I82509 Chronic embolism and thrombosis of unspecified deep veins of unspecified lower extremity: Secondary | ICD-10-CM

## 2011-10-02 DIAGNOSIS — Z5181 Encounter for therapeutic drug level monitoring: Secondary | ICD-10-CM

## 2011-10-02 LAB — CBC WITH DIFFERENTIAL/PLATELET
Basophils Absolute: 0 10*3/uL (ref 0.0–0.1)
EOS%: 0.8 % (ref 0.0–7.0)
LYMPH%: 11.4 % — ABNORMAL LOW (ref 14.0–49.7)
MCH: 29.5 pg (ref 25.1–34.0)
MCV: 91.1 fL (ref 79.5–101.0)
MONO%: 12.4 % (ref 0.0–14.0)
RBC: 4.38 10*6/uL (ref 3.70–5.45)
RDW: 14.5 % (ref 11.2–14.5)
nRBC: 0 % (ref 0–0)

## 2011-10-02 LAB — COMPREHENSIVE METABOLIC PANEL
Alkaline Phosphatase: 47 U/L (ref 39–117)
Creatinine, Ser: 0.81 mg/dL (ref 0.50–1.10)
Glucose, Bld: 90 mg/dL (ref 70–99)
Sodium: 141 mEq/L (ref 135–145)
Total Bilirubin: 0.2 mg/dL — ABNORMAL LOW (ref 0.3–1.2)
Total Protein: 6.2 g/dL (ref 6.0–8.3)

## 2011-10-02 LAB — PROTIME-INR

## 2011-10-02 MED ORDER — BORTEZOMIB CHEMO IV INJECTION 3.5 MG
1.3000 mg/m2 | Freq: Once | INTRAMUSCULAR | Status: AC
  Administered 2011-10-02: 2 mg via INTRAVENOUS
  Filled 2011-10-02: qty 2

## 2011-10-02 MED ORDER — SODIUM CHLORIDE 0.9 % IV SOLN
Freq: Once | INTRAVENOUS | Status: AC
  Administered 2011-10-02: 15:00:00 via INTRAVENOUS

## 2011-10-02 MED ORDER — ONDANSETRON 8 MG/50ML IVPB (CHCC)
8.0000 mg | Freq: Once | INTRAVENOUS | Status: AC
  Administered 2011-10-02: 8 mg via INTRAVENOUS

## 2011-10-02 NOTE — Progress Notes (Signed)
Pt INR =1.7 today on 5mg  daily.  No changes to report.  Will take 7.5 mg today and resume 5mg  daily.  We will recheck  her INR in one week when she is back for her NP appmt and infusion appmt.

## 2011-10-02 NOTE — Patient Instructions (Signed)
Pt was seen by Ike Bene, pharmacist for coumadin dose adjustment.   Pt tolerated chemo without problems.  Pt was discharged home with daughter via ambulation with walker.  Pt aware of next return appt to the clinic.

## 2011-10-03 ENCOUNTER — Telehealth: Payer: Self-pay | Admitting: Internal Medicine

## 2011-10-03 ENCOUNTER — Other Ambulatory Visit: Payer: Self-pay | Admitting: Internal Medicine

## 2011-10-03 NOTE — Telephone Encounter (Signed)
Per Dimas Alexandria I called pts daughter and I left a voice mail instructing her to give pt coumadin 5 mg today and to return call if she has any questions.

## 2011-10-03 NOTE — Telephone Encounter (Signed)
Sue Lush called back and said pharmacist instructed pt to take 5 mg daily  At last visit. Per Delilah Shan told Sue Lush to do as pharmacist instructed and disregard my previous instructions.She voices understanding,

## 2011-10-04 ENCOUNTER — Other Ambulatory Visit: Payer: Self-pay | Admitting: Physician Assistant

## 2011-10-04 DIAGNOSIS — Z86718 Personal history of other venous thrombosis and embolism: Secondary | ICD-10-CM

## 2011-10-09 ENCOUNTER — Other Ambulatory Visit: Payer: Medicare Other | Admitting: Lab

## 2011-10-09 ENCOUNTER — Ambulatory Visit (HOSPITAL_BASED_OUTPATIENT_CLINIC_OR_DEPARTMENT_OTHER): Payer: Medicare Other

## 2011-10-09 ENCOUNTER — Telehealth: Payer: Self-pay | Admitting: Internal Medicine

## 2011-10-09 ENCOUNTER — Ambulatory Visit (HOSPITAL_BASED_OUTPATIENT_CLINIC_OR_DEPARTMENT_OTHER): Payer: Medicare Other | Admitting: Physician Assistant

## 2011-10-09 ENCOUNTER — Other Ambulatory Visit (HOSPITAL_BASED_OUTPATIENT_CLINIC_OR_DEPARTMENT_OTHER): Payer: Medicare Other | Admitting: Lab

## 2011-10-09 ENCOUNTER — Ambulatory Visit (HOSPITAL_BASED_OUTPATIENT_CLINIC_OR_DEPARTMENT_OTHER): Payer: Medicare Other | Admitting: Pharmacist

## 2011-10-09 VITALS — BP 123/78 | HR 84 | Temp 98.0°F | Ht 61.0 in | Wt 117.0 lb

## 2011-10-09 DIAGNOSIS — C9000 Multiple myeloma not having achieved remission: Secondary | ICD-10-CM

## 2011-10-09 DIAGNOSIS — Z7901 Long term (current) use of anticoagulants: Secondary | ICD-10-CM

## 2011-10-09 DIAGNOSIS — M79609 Pain in unspecified limb: Secondary | ICD-10-CM

## 2011-10-09 DIAGNOSIS — I82409 Acute embolism and thrombosis of unspecified deep veins of unspecified lower extremity: Secondary | ICD-10-CM

## 2011-10-09 DIAGNOSIS — Z79899 Other long term (current) drug therapy: Secondary | ICD-10-CM

## 2011-10-09 DIAGNOSIS — Z5112 Encounter for antineoplastic immunotherapy: Secondary | ICD-10-CM

## 2011-10-09 DIAGNOSIS — M545 Low back pain: Secondary | ICD-10-CM

## 2011-10-09 LAB — PROTIME-INR
INR: 3.1 (ref 2.00–3.50)
Protime: 37.2 Seconds — ABNORMAL HIGH (ref 10.6–13.4)

## 2011-10-09 LAB — CBC WITH DIFFERENTIAL/PLATELET
Basophils Absolute: 0 10*3/uL (ref 0.0–0.1)
Eosinophils Absolute: 0 10*3/uL (ref 0.0–0.5)
HGB: 12.7 g/dL (ref 11.6–15.9)
MCV: 90.6 fL (ref 79.5–101.0)
MONO#: 0.7 10*3/uL (ref 0.1–0.9)
MONO%: 10.8 % (ref 0.0–14.0)
NEUT#: 4.8 10*3/uL (ref 1.5–6.5)
Platelets: 220 10*3/uL (ref 145–400)
RBC: 4.34 10*6/uL (ref 3.70–5.45)
RDW: 14.5 % (ref 11.2–14.5)
WBC: 6.2 10*3/uL (ref 3.9–10.3)
nRBC: 0 % (ref 0–0)

## 2011-10-09 LAB — COMPREHENSIVE METABOLIC PANEL
ALT: 10 U/L (ref 0–35)
Albumin: 3.8 g/dL (ref 3.5–5.2)
CO2: 26 mEq/L (ref 19–32)
Calcium: 9.3 mg/dL (ref 8.4–10.5)
Chloride: 102 mEq/L (ref 96–112)
Glucose, Bld: 107 mg/dL — ABNORMAL HIGH (ref 70–99)
Sodium: 139 mEq/L (ref 135–145)
Total Bilirubin: 0.3 mg/dL (ref 0.3–1.2)
Total Protein: 6.3 g/dL (ref 6.0–8.3)

## 2011-10-09 LAB — LACTATE DEHYDROGENASE: LDH: 223 U/L (ref 94–250)

## 2011-10-09 MED ORDER — BORTEZOMIB CHEMO IV INJECTION 3.5 MG
1.3000 mg/m2 | Freq: Once | INTRAMUSCULAR | Status: AC
  Administered 2011-10-09: 2 mg via INTRAVENOUS
  Filled 2011-10-09: qty 2

## 2011-10-09 MED ORDER — ONDANSETRON 8 MG/50ML IVPB (CHCC)
8.0000 mg | Freq: Once | INTRAVENOUS | Status: AC
  Administered 2011-10-09: 8 mg via INTRAVENOUS

## 2011-10-09 MED ORDER — SODIUM CHLORIDE 0.9 % IV SOLN
Freq: Once | INTRAVENOUS | Status: AC
  Administered 2011-10-09: 15:00:00 via INTRAVENOUS

## 2011-10-09 NOTE — Telephone Encounter (Signed)
Gv pts daughter scheduled for [phone]

## 2011-10-09 NOTE — Progress Notes (Signed)
INR supratherapeutic (3.1) after a slight increase to 7.5mg  on Thursday, 5mg  other days last week. No problems with bleeding or bruising.  Will decrease dose back to 5mg  daily, and recheck INR next week with her next chemo appt.

## 2011-10-13 ENCOUNTER — Other Ambulatory Visit: Payer: Self-pay | Admitting: Internal Medicine

## 2011-10-13 DIAGNOSIS — F419 Anxiety disorder, unspecified: Secondary | ICD-10-CM

## 2011-10-13 DIAGNOSIS — G4701 Insomnia due to medical condition: Secondary | ICD-10-CM

## 2011-10-13 NOTE — Progress Notes (Signed)
[facility] OFFICE PROGRESS NOTE  [name], MD, MD [address] [place] [address]  DIAGNOSIS:  1. multiple myeloma diagnosed in September 2009,  2. history of bilateral lower extremity deep vein thrombosis diagnosed in November 2009  3. acute on chronic deep vein thrombosis in the left lower extremity diagnosed in October 2010.  PRIOR THERAPY:  1. status post palliative radiotherapy to the right femur and is she'll tuberosity, first was done in December 2011 and the second treatment was completed Jan 08 2011, and the third treatment to the right distal femur completed on 04/30/2011  2. status post palliative radiotherapy to the left proximal femur completed 02/28/2011. 3. Revlimid 25 mg by mouth daily for 21 days every 4 weeks in addition to oral Decadron at 40 mg by mouth on a weekly basis status post 37 cycles.   CURRENT THERAPY:  1. weekly subcutaneous Velcade 1.3 mg/M2 status post 3 cycles. 2. Coumadin 5 mg on Mondays and Thursdays and 2.5 mg on all other days.  3. Zometa 4 mg IV given every 2 months.   INTERVAL HISTORY: [name] 76 y.o. female returns to the clinic today for followup visit accompanied by her daughter. She continues to tolerate the weekly Velcade relatively well. She continues to have pain in the lower back with radiation to the lower extremities and buttock region. Her pain medications help " a little". She denied having any significant nausea or vomiting, no chest pain or shortness of breath, no cough or hemoptysis. She has no weight loss. The patient has been on Coumadin 5 mg by mouth daily for her history of deep venous thrombosis.  MEDICAL HISTORY: Past Medical History  Diagnosis Date  . Blood transfusion   . Depression     Mild  . History of chicken pox   . Hyperlipidemia   . Hypertension   . Degenerative joint disease   . Diabetes mellitus     Steroid related  . Phlebitis   . Multiple myeloma     per Dr. [name], s/p  palliative radiation for leg pain 2011 per Dr. Roselind Messier    ALLERGIES:   has no known allergies.  MEDICATIONS:  Current Outpatient Prescriptions  Medication Sig Dispense Refill  . ALPRAZolam (XANAX) 0.5 MG tablet Take 1 tablet (0.5 mg total) by mouth 3 (three) times daily as needed for sleep or anxiety (q 6-8 hours prn nausea or anxiety).  30 tablet  0  . Calcium Carbonate-Vitamin D 600-400 MG-UNIT per tablet Take 1 tablet by mouth 3 (three) times daily with meals.        Marland Kitchen dexamethasone (DECADRON) 4 MG tablet TAKE TEN TABLETS BY MOUTH ONCE A WEEK WITH FOOD AS DIRECTED  40 tablet  2  . glipiZIDE (GLUCOTROL XL) 2.5 MG 24 hr tablet TAKE ONE TABLET BY MOUTH EVERY DAY  90 tablet  2  . ibuprofen (ADVIL,MOTRIN) 200 MG tablet Take 200 mg by mouth every 6 (six) hours as needed.        Marland Kitchen KLOR-CON M20 20 MEQ tablet TAKE ONE TABLET BY MOUTH EVERY DAY  30 each  sio  . Lancets (ONETOUCH ULTRASOFT) lancets Test blood sugar once daily or as directed.  100 each  11  . LISINOPRIL PO Take by mouth. 1/2 tab daily      . magnesium oxide (MAG-OX) 400 MG tablet Take 400 mg by mouth daily.       . ONE TOUCH ULTRA TEST test strip TEST BLOOD  SUGAR ONCE DAILY OR AS DIRECTED  25 each  6  . oxyCODONE (OXY IR/ROXICODONE) 5 MG immediate release tablet Take 1 1/2 tabs by mouth every 6 hours as needed for pain  90 tablet  0  . oxyCODONE (OXYCONTIN) 10 MG 12 hr tablet Take 10 mg by mouth every 12 (twelve) hours. Restarted per Dr Donnald Garre on 10/01/11.  SLJ      . prochlorperazine (COMPAZINE) 10 MG tablet Take 1 tablet (10 mg total) by mouth every 6 (six) hours as needed.  30 tablet  0  . temazepam (RESTORIL) 30 MG capsule Take 1 capsule (30 mg total) by mouth at bedtime as needed for sleep.  30 capsule  1  . warfarin (COUMADIN) 2 MG tablet Take 2 mg by mouth daily.        Marland Kitchen warfarin (COUMADIN) 5 MG tablet TAKE ONE TABLET BY MOUTH EVERY DAY ON TUESDAY, THURSDAY AND SATURDAY AND TAKE 1/2 TABLET (2.5MG ) ON ALL OTHER DAYS OR AS DIRECTED   60 tablet  0    SURGICAL HISTORY:  Past Surgical History  Procedure Date  . Abdominal hysterectomy     REVIEW OF SYSTEMS:  A comprehensive review of systems was negative except for: Constitutional: positive for fatigue Musculoskeletal: positive for back pain   PHYSICAL EXAMINATION: General appearance: alert, cooperative and no distress Resp: clear to auscultation bilaterally Cardio: regular rate and rhythm, S1, S2 normal, no murmur, click, rub or gallop GI: soft, non-tender; bowel sounds normal; no masses,  no organomegaly Extremities: extremities normal, atraumatic, no cyanosis or edema  ECOG PERFORMANCE STATUS: 1 - Symptomatic but completely ambulatory  Blood pressure 123/78, pulse 84, temperature 98 F (36.7 C), temperature source Oral, height 5\' 1"  (1.549 m), weight 117 lb (53.071 kg).  LABORATORY DATA: Lab Results  Component Value Date   WBC 6.2 10/09/2011   HGB 12.7 10/09/2011   HCT 39.3 10/09/2011   MCV 90.6 10/09/2011   PLT 220 10/09/2011      Chemistry      Component Value Date/Time   NA 139 10/09/2011 1322   K 4.2 10/09/2011 1322   CL 102 10/09/2011 1322   CO2 26 10/09/2011 1322   BUN 19 10/09/2011 1322   CREATININE 0.88 10/09/2011 1322      Component Value Date/Time   CALCIUM 9.3 10/09/2011 1322   CALCIUM  Value: 5.7 CRITICAL RESULT CALLED TO, READ BACK BY AND VERIFIED WITH: JAMIE TRACY,RN [id number] @ 1433 BY J SCOTTON (NOTE)  Amended report. Result repeated and verified. CORRECTED ON 10/14 AT 1429: PREVIOUSLY REPORTED AS Result repeated and verified.* 06/13/2008 1605   ALKPHOS 49 10/09/2011 1322   AST 15 10/09/2011 1322   ALT 10 10/09/2011 1322   BILITOT 0.3 10/09/2011 1322       RADIOGRAPHIC STUDIES: No results found.  ASSESSMENT: This is a very pleasant 76 years old [place] American female with a history of multiple myeloma currently on treatment with weekly subcutaneous Velcade with weekly Decadron 40 mg by mouth, status post 3 cycles. The patient is tolerating her treatment  fairly well but she continues to have low back and lower extremity pain.The patient was discussed with Dr. [name]. She will continue with her weekly Velcade treatment. She will also continue on her current pain medications. Her PT/INR is managed by the [facility]. She will follow up in 3 weeks with a CBC differential, CMET, LDH and quantitative immunoglobin, B2 microglobin, and serum light chains to re-evaluate her disease.  [name], Lindsay Rotert E, PA-C   All questions were answered. The patient knows to call the clinic with any problems, questions or concerns. We can certainly see the patient much sooner if necessary.

## 2011-10-15 ENCOUNTER — Other Ambulatory Visit: Payer: Self-pay | Admitting: Pharmacist

## 2011-10-15 DIAGNOSIS — I82409 Acute embolism and thrombosis of unspecified deep veins of unspecified lower extremity: Secondary | ICD-10-CM

## 2011-10-16 ENCOUNTER — Ambulatory Visit (HOSPITAL_BASED_OUTPATIENT_CLINIC_OR_DEPARTMENT_OTHER): Payer: Medicare Other

## 2011-10-16 ENCOUNTER — Ambulatory Visit: Payer: Medicare Other | Admitting: Pharmacist

## 2011-10-16 ENCOUNTER — Other Ambulatory Visit: Payer: Medicare Other | Admitting: Lab

## 2011-10-16 VITALS — BP 148/80 | HR 68 | Temp 97.8°F

## 2011-10-16 DIAGNOSIS — I82409 Acute embolism and thrombosis of unspecified deep veins of unspecified lower extremity: Secondary | ICD-10-CM

## 2011-10-16 DIAGNOSIS — C9000 Multiple myeloma not having achieved remission: Secondary | ICD-10-CM

## 2011-10-16 DIAGNOSIS — Z7901 Long term (current) use of anticoagulants: Secondary | ICD-10-CM

## 2011-10-16 DIAGNOSIS — Z5112 Encounter for antineoplastic immunotherapy: Secondary | ICD-10-CM

## 2011-10-16 LAB — CBC WITH DIFFERENTIAL/PLATELET
Basophils Absolute: 0 10*3/uL (ref 0.0–0.1)
EOS%: 1.1 % (ref 0.0–7.0)
HCT: 35.8 % (ref 34.8–46.6)
HGB: 11.8 g/dL (ref 11.6–15.9)
LYMPH%: 17.8 % (ref 14.0–49.7)
MCH: 29.3 pg (ref 25.1–34.0)
MCV: 88.8 fL (ref 79.5–101.0)
MONO%: 12.8 % (ref 0.0–14.0)
NEUT%: 68.1 % (ref 38.4–76.8)
Platelets: 246 10*3/uL (ref 145–400)

## 2011-10-16 LAB — COMPREHENSIVE METABOLIC PANEL
AST: 20 U/L (ref 0–37)
Alkaline Phosphatase: 48 U/L (ref 39–117)
BUN: 11 mg/dL (ref 6–23)
Calcium: 9.5 mg/dL (ref 8.4–10.5)
Chloride: 105 mEq/L (ref 96–112)
Creatinine, Ser: 0.89 mg/dL (ref 0.50–1.10)

## 2011-10-16 MED ORDER — BORTEZOMIB CHEMO IV INJECTION 3.5 MG
1.3000 mg/m2 | Freq: Once | INTRAMUSCULAR | Status: AC
  Administered 2011-10-16: 2 mg via INTRAVENOUS
  Filled 2011-10-16: qty 2

## 2011-10-16 MED ORDER — SODIUM CHLORIDE 0.9 % IV SOLN
Freq: Once | INTRAVENOUS | Status: AC
  Administered 2011-10-16: 13:00:00 via INTRAVENOUS

## 2011-10-16 MED ORDER — ONDANSETRON 8 MG/50ML IVPB (CHCC)
8.0000 mg | Freq: Once | INTRAVENOUS | Status: AC
  Administered 2011-10-16: 8 mg via INTRAVENOUS

## 2011-10-16 NOTE — Progress Notes (Signed)
Take 7.5mg  (1&1/2 tablets) today only.  Then continue 5mg  daily starting tomorrow, 10/17/11.   Recheck INR in 1 week when you return for chemo on 10/23/11.   The pharmacist will come and visit with you while you are in the infusion area.

## 2011-10-16 NOTE — Patient Instructions (Signed)
Call with any problems

## 2011-10-16 NOTE — Patient Instructions (Signed)
Take 7.5mg (1&1/2 tablets) today only.  Then continue 5mg daily starting tomorrow, 10/17/11.   Recheck INR in 1 week when you return for chemo on 10/23/11.   The pharmacist will come and visit with you while you are in the infusion area. 

## 2011-10-20 ENCOUNTER — Other Ambulatory Visit: Payer: Self-pay | Admitting: *Deleted

## 2011-10-20 MED ORDER — OXYCODONE HCL 10 MG PO TB12: 10.0000 mg | ORAL_TABLET | Freq: Two times a day (BID) | ORAL | Status: DC

## 2011-10-21 ENCOUNTER — Other Ambulatory Visit: Payer: Self-pay | Admitting: Internal Medicine

## 2011-10-22 ENCOUNTER — Encounter: Payer: Self-pay | Admitting: Internal Medicine

## 2011-10-22 NOTE — Progress Notes (Signed)
Called [facility] [id number] for oxycontin 10mg , should receive answer via fax with 24 hours.

## 2011-10-23 ENCOUNTER — Ambulatory Visit (HOSPITAL_BASED_OUTPATIENT_CLINIC_OR_DEPARTMENT_OTHER): Payer: Medicare Other

## 2011-10-23 ENCOUNTER — Other Ambulatory Visit: Payer: Medicare Other | Admitting: Lab

## 2011-10-23 ENCOUNTER — Encounter: Payer: Self-pay | Admitting: *Deleted

## 2011-10-23 ENCOUNTER — Ambulatory Visit: Payer: Medicare Other | Admitting: Pharmacist

## 2011-10-23 VITALS — BP 148/76 | HR 65 | Temp 98.7°F

## 2011-10-23 DIAGNOSIS — C9000 Multiple myeloma not having achieved remission: Secondary | ICD-10-CM

## 2011-10-23 DIAGNOSIS — Z7901 Long term (current) use of anticoagulants: Secondary | ICD-10-CM

## 2011-10-23 DIAGNOSIS — Z5112 Encounter for antineoplastic immunotherapy: Secondary | ICD-10-CM

## 2011-10-23 DIAGNOSIS — I82409 Acute embolism and thrombosis of unspecified deep veins of unspecified lower extremity: Secondary | ICD-10-CM

## 2011-10-23 LAB — CBC WITH DIFFERENTIAL/PLATELET
Basophils Absolute: 0 10*3/uL (ref 0.0–0.1)
EOS%: 1.9 % (ref 0.0–7.0)
HGB: 11.5 g/dL — ABNORMAL LOW (ref 11.6–15.9)
MCH: 29 pg (ref 25.1–34.0)
NEUT#: 3 10*3/uL (ref 1.5–6.5)
RDW: 14.3 % (ref 11.2–14.5)
WBC: 4.3 10*3/uL (ref 3.9–10.3)
lymph#: 0.7 10*3/uL — ABNORMAL LOW (ref 0.9–3.3)

## 2011-10-23 LAB — COMPREHENSIVE METABOLIC PANEL
AST: 18 U/L (ref 0–37)
Albumin: 3.5 g/dL (ref 3.5–5.2)
Alkaline Phosphatase: 44 U/L (ref 39–117)
Glucose, Bld: 79 mg/dL (ref 70–99)
Potassium: 4.4 mEq/L (ref 3.5–5.3)
Sodium: 141 mEq/L (ref 135–145)
Total Protein: 6.1 g/dL (ref 6.0–8.3)

## 2011-10-23 LAB — POCT INR: INR: 2.8

## 2011-10-23 LAB — PROTIME-INR: INR: 2.8 (ref 2.00–3.50)

## 2011-10-23 MED ORDER — ONDANSETRON 8 MG/50ML IVPB (CHCC)
8.0000 mg | Freq: Once | INTRAVENOUS | Status: AC
  Administered 2011-10-23: 8 mg via INTRAVENOUS

## 2011-10-23 MED ORDER — BORTEZOMIB CHEMO IV INJECTION 3.5 MG
1.3000 mg/m2 | Freq: Once | INTRAMUSCULAR | Status: AC
  Administered 2011-10-23: 2 mg via INTRAVENOUS
  Filled 2011-10-23: qty 2

## 2011-10-23 NOTE — Progress Notes (Signed)
Pt's daughter called stating that pt's oxycontin rx is going to be $95.00 even after prior authorization.  Per Thelma Barge, pt's insurance has changed oxycontin to a tier 4 drug which means she would need to use a drug on the preferred list to pay less then $95.00.  Pt's daughter is concerned about this because pt has pain control with the oxycontin 10mg  BID but they are worried about financially paying $95.00 monthly when it has only been $7.00 monthly.  Referred case to medical mgmt and Axel Filler and Thelma Barge will be looking options.  Pt did get rx filled for oxycontin today and paid the $95.00.  SLJ

## 2011-10-23 NOTE — Patient Instructions (Signed)
Continue 5 mg daily.  We will recheck your INR in one week with your next infusion on 10/30/11.

## 2011-10-23 NOTE — Progress Notes (Signed)
Pt reports of leg pain that comes and goes.  She says it is relieved with a "pill" she takes.  She has not missed any coumadin doses.  Will plan to keep her on 5mg  daily and recheck her next week with her infusion on 10/30/11.

## 2011-10-24 ENCOUNTER — Other Ambulatory Visit: Payer: Self-pay | Admitting: Internal Medicine

## 2011-10-24 DIAGNOSIS — C9000 Multiple myeloma not having achieved remission: Secondary | ICD-10-CM

## 2011-10-29 ENCOUNTER — Other Ambulatory Visit: Payer: Self-pay | Admitting: Internal Medicine

## 2011-10-30 ENCOUNTER — Ambulatory Visit: Payer: Medicare Other | Admitting: Pharmacist

## 2011-10-30 ENCOUNTER — Ambulatory Visit (HOSPITAL_BASED_OUTPATIENT_CLINIC_OR_DEPARTMENT_OTHER): Payer: Medicare Other

## 2011-10-30 ENCOUNTER — Telehealth: Payer: Self-pay | Admitting: Internal Medicine

## 2011-10-30 ENCOUNTER — Other Ambulatory Visit: Payer: Medicare Other | Admitting: Lab

## 2011-10-30 ENCOUNTER — Ambulatory Visit (HOSPITAL_BASED_OUTPATIENT_CLINIC_OR_DEPARTMENT_OTHER): Payer: Medicare Other | Admitting: Physician Assistant

## 2011-10-30 VITALS — BP 147/80 | HR 76 | Temp 98.2°F | Ht 61.0 in | Wt 120.0 lb

## 2011-10-30 DIAGNOSIS — Z7901 Long term (current) use of anticoagulants: Secondary | ICD-10-CM

## 2011-10-30 DIAGNOSIS — I82409 Acute embolism and thrombosis of unspecified deep veins of unspecified lower extremity: Secondary | ICD-10-CM

## 2011-10-30 DIAGNOSIS — C9000 Multiple myeloma not having achieved remission: Secondary | ICD-10-CM

## 2011-10-30 DIAGNOSIS — Z5112 Encounter for antineoplastic immunotherapy: Secondary | ICD-10-CM

## 2011-10-30 DIAGNOSIS — R110 Nausea: Secondary | ICD-10-CM

## 2011-10-30 LAB — CBC WITH DIFFERENTIAL/PLATELET
BASO%: 0.1 % (ref 0.0–2.0)
EOS%: 0.8 % (ref 0.0–7.0)
HCT: 34.7 % — ABNORMAL LOW (ref 34.8–46.6)
LYMPH%: 10.5 % — ABNORMAL LOW (ref 14.0–49.7)
MCH: 28.6 pg (ref 25.1–34.0)
MCHC: 31.7 g/dL (ref 31.5–36.0)
NEUT%: 79.5 % — ABNORMAL HIGH (ref 38.4–76.8)
Platelets: 223 10*3/uL (ref 145–400)

## 2011-10-30 LAB — POCT INR: INR: 1.9

## 2011-10-30 MED ORDER — PROCHLORPERAZINE MALEATE 10 MG PO TABS: 10.0000 mg | ORAL_TABLET | Freq: Four times a day (QID) | ORAL | Status: DC | PRN

## 2011-10-30 MED ORDER — BORTEZOMIB CHEMO IV INJECTION 3.5 MG
1.3000 mg/m2 | Freq: Once | INTRAMUSCULAR | Status: AC
  Administered 2011-10-30: 2 mg via INTRAVENOUS
  Filled 2011-10-30: qty 2

## 2011-10-30 MED ORDER — SODIUM CHLORIDE 0.9 % IV SOLN
Freq: Once | INTRAVENOUS | Status: AC
  Administered 2011-10-30: 15:00:00 via INTRAVENOUS

## 2011-10-30 MED ORDER — ONDANSETRON 8 MG/50ML IVPB (CHCC)
8.0000 mg | Freq: Once | INTRAVENOUS | Status: AC
  Administered 2011-10-30: 8 mg via INTRAVENOUS

## 2011-10-30 NOTE — Patient Instructions (Signed)
Continue 5 mg daily.

## 2011-10-30 NOTE — Progress Notes (Signed)
Saw patient in infusion today.  No changes in meds or diet.  Continue 5 mg daily.  Will see next week in infusion.

## 2011-10-30 NOTE — Telephone Encounter (Signed)
sch printed for pt in tx room  aom

## 2011-11-02 NOTE — Progress Notes (Signed)
[facility] OFFICE PROGRESS NOTE  [name], MD, MD [address] [place] [address]  DIAGNOSIS:  1. multiple myeloma diagnosed in September 2009,  2. history of bilateral lower extremity deep vein thrombosis diagnosed in November 2009  3. acute on chronic deep vein thrombosis in the left lower extremity diagnosed in October 2010.  PRIOR THERAPY:  1. status post palliative radiotherapy to the right femur and is she'll tuberosity, first was done in December 2011 and the second treatment was completed Jan 08 2011, and the third treatment to the right distal femur completed on 04/30/2011  2. status post palliative radiotherapy to the left proximal femur completed 02/28/2011. 3. Revlimid 25 mg by mouth daily for 21 days every 4 weeks in addition to oral Decadron at 40 mg by mouth on a weekly basis status post 37 cycles.   CURRENT THERAPY:  1. weekly subcutaneous Velcade 1.3 mg/M2 status post 3 cycles. 2. Coumadin 5 mg on Mondays and Thursdays and 2.5 mg on all other days.  3. Zometa 4 mg IV given every 2 months.   INTERVAL HISTORY: [name] 76 y.o. female returns to the clinic today for followup visit accompanied by her daughter. She continues to tolerate the weekly Velcade relatively well. She continues to have pain in the  lower extremities. Her pain medications help " a little". She denied having any significant nausea or vomiting, no chest pain or shortness of breath, no cough or hemoptysis. She has no weight loss. The patient has been on Coumadin 5 mg by mouth daily for her history of deep venous thrombosis. This is managed by the [facility].  MEDICAL HISTORY: Past Medical History  Diagnosis Date  . Blood transfusion   . Depression     Mild  . History of chicken pox   . Hyperlipidemia   . Hypertension   . Degenerative joint disease   . Diabetes mellitus     Steroid related  . Phlebitis   . Multiple myeloma     per  Dr. [name], s/p palliative radiation for leg pain 2011 per Dr. Roselind Messier    ALLERGIES:   has no known allergies.  MEDICATIONS:  Current Outpatient Prescriptions  Medication Sig Dispense Refill  . ALPRAZolam (XANAX) 0.5 MG tablet TAKE ONE TABLET BY MOUTH THREE TIMES DAILY AS NEEDED FOR SLEEP OR  ANXIETY  60 tablet  0  . Calcium Carbonate-Vitamin D 600-400 MG-UNIT per tablet Take 1 tablet by mouth 3 (three) times daily with meals.        Marland Kitchen dexamethasone (DECADRON) 4 MG tablet TAKE 10 TABLETS BY MOUTH ONCE A WEEK WITH FOOD AS DIRECTED  40 tablet  1  . glipiZIDE (GLUCOTROL XL) 2.5 MG 24 hr tablet TAKE ONE TABLET BY MOUTH EVERY DAY  90 tablet  2  . ibuprofen (ADVIL,MOTRIN) 200 MG tablet Take 200 mg by mouth every 6 (six) hours as needed.        Marland Kitchen KLOR-CON M20 20 MEQ tablet TAKE ONE TABLET BY MOUTH EVERY DAY  30 each  1  . Lancets (ONETOUCH ULTRASOFT) lancets Test blood sugar once daily or as directed.  100 each  11  . lisinopril-hydrochlorothiazide (PRINZIDE,ZESTORETIC) 20-12.5 MG per tablet Take 1 tablet by mouth daily.      . magnesium oxide (MAG-OX) 400 MG tablet Take 400 mg by mouth daily.       . ONE TOUCH ULTRA TEST test strip TEST BLOOD SUGAR ONCE DAILY  OR AS DIRECTED  25 each  6  . oxyCODONE (OXY IR/ROXICODONE) 5 MG immediate release tablet Take 1 1/2 tabs by mouth every 6 hours as needed for pain  90 tablet  0  . oxyCODONE (OXYCONTIN) 10 MG 12 hr tablet Take 1 tablet (10 mg total) by mouth every 12 (twelve) hours. Restarted per Dr Donnald Garre on 10/01/11.  SLJ  60 tablet  0  . prochlorperazine (COMPAZINE) 10 MG tablet Take 1 tablet (10 mg total) by mouth every 6 (six) hours as needed.  30 tablet  1  . temazepam (RESTORIL) 30 MG capsule Take 1 capsule (30 mg total) by mouth at bedtime as needed for sleep.  30 capsule  1  . warfarin (COUMADIN) 5 MG tablet         SURGICAL HISTORY:  Past Surgical History  Procedure Date  . Abdominal hysterectomy     REVIEW OF SYSTEMS:  A comprehensive review of  systems was negative except for: Constitutional: positive for fatigue and malaise Musculoskeletal: positive for back pain and bilateral lower extremity pain   PHYSICAL EXAMINATION: General appearance: alert, cooperative, appears older than stated age and no distress Head: Normocephalic, without obvious abnormality, atraumatic Neck: no adenopathy, no carotid bruit, no JVD, supple, symmetrical, trachea midline and thyroid not enlarged, symmetric, no tenderness/mass/nodules Lymph nodes: Cervical, supraclavicular, and axillary nodes normal. Resp: clear to auscultation bilaterally Cardio: regular rate and rhythm, S1, S2 normal, no murmur, click, rub or gallop GI: soft, non-tender; bowel sounds normal; no masses,  no organomegaly Extremities: extremities normal, atraumatic, no cyanosis or edema  ECOG PERFORMANCE STATUS: 1 - Symptomatic but completely ambulatory  Blood pressure 147/80, pulse 76, temperature 98.2 F (36.8 C), temperature source Oral, height 5\' 1"  (1.549 m), weight 120 lb (54.432 kg).  LABORATORY DATA: Lab Results  Component Value Date   WBC 7.2 10/30/2011   HGB 11.0* 10/30/2011   HCT 34.7* 10/30/2011   MCV 90.4 10/30/2011   PLT 223 10/30/2011      Chemistry      Component Value Date/Time   NA 143 10/30/2011 1304   K 4.2 10/30/2011 1304   CL 105 10/30/2011 1304   CO2 28 10/30/2011 1304   BUN 17 10/30/2011 1304   CREATININE 0.88 10/30/2011 1304      Component Value Date/Time   CALCIUM 9.6 10/30/2011 1304   CALCIUM  Value: 5.7 CRITICAL RESULT CALLED TO, READ BACK BY AND VERIFIED WITH: JAMIE TRACY,RN [id number] @ 1433 BY J SCOTTON (NOTE)  Amended report. Result repeated and verified. CORRECTED ON 10/14 AT 1429: PREVIOUSLY REPORTED AS Result repeated and verified.* 06/13/2008 1605   ALKPHOS 49 10/30/2011 1304   AST 17 10/30/2011 1304   ALT 13 10/30/2011 1304   BILITOT 0.3 10/30/2011 1304       RADIOGRAPHIC STUDIES: No results found.  ASSESSMENT: This is a very pleasant 76 years old  [place] American female with a history of multiple myeloma currently on treatment with weekly subcutaneous Velcade with weekly Decadron 40 mg by mouth.Protin studies to re-evaluate her disease are pending from today.The patient is tolerating her treatment fairly well but she continues to have low back and lower extremity pain. She will continue with her weekly Velcade treatment. She will also continue on her current pain medications.She is given a prescription for her oxycodone 5 mg tablets, 1 1/2 tablets by mouth every 6 hours as needed for breakthrough pain, a total of 90 tablets with no refill. Her PT/INR is managed by the [facility]. She will follow up in 2 weeks with a CBC differential, CMET, LDH to discuss the results of her protein studies with Dr. [name].  [name], Lindsay Vanscyoc E, PA-C   All questions were answered. The patient knows to call the clinic with any problems, questions or concerns. We can certainly see the patient much sooner if necessary.

## 2011-11-04 LAB — COMPREHENSIVE METABOLIC PANEL
AST: 17 U/L (ref 0–37)
Alkaline Phosphatase: 49 U/L (ref 39–117)
BUN: 17 mg/dL (ref 6–23)
Calcium: 9.6 mg/dL (ref 8.4–10.5)
Chloride: 105 mEq/L (ref 96–112)
Creatinine, Ser: 0.88 mg/dL (ref 0.50–1.10)
Glucose, Bld: 67 mg/dL — ABNORMAL LOW (ref 70–99)

## 2011-11-04 LAB — SPEP & IFE WITH QIG
Alpha-1-Globulin: 8 % — ABNORMAL HIGH (ref 2.9–4.9)
Alpha-2-Globulin: 17.1 % — ABNORMAL HIGH (ref 7.1–11.8)
Beta 2: 6.2 % (ref 3.2–6.5)
Gamma Globulin: 10.2 % — ABNORMAL LOW (ref 11.1–18.8)

## 2011-11-04 LAB — BETA 2 MICROGLOBULIN, SERUM: Beta-2 Microglobulin: 2.09 mg/L — ABNORMAL HIGH (ref 1.01–1.73)

## 2011-11-04 LAB — KAPPA/LAMBDA LIGHT CHAINS
Kappa:Lambda Ratio: 0.23 — ABNORMAL LOW (ref 0.26–1.65)
Lambda Free Lght Chn: 4.76 mg/dL — ABNORMAL HIGH (ref 0.57–2.63)

## 2011-11-04 LAB — LACTATE DEHYDROGENASE: LDH: 244 U/L (ref 94–250)

## 2011-11-06 ENCOUNTER — Other Ambulatory Visit: Payer: Self-pay | Admitting: Internal Medicine

## 2011-11-06 ENCOUNTER — Ambulatory Visit (HOSPITAL_BASED_OUTPATIENT_CLINIC_OR_DEPARTMENT_OTHER): Payer: Medicare Other

## 2011-11-06 ENCOUNTER — Other Ambulatory Visit (HOSPITAL_BASED_OUTPATIENT_CLINIC_OR_DEPARTMENT_OTHER): Payer: Medicare Other | Admitting: Lab

## 2011-11-06 ENCOUNTER — Ambulatory Visit: Payer: Medicare Other | Admitting: Pharmacist

## 2011-11-06 VITALS — BP 117/65 | HR 79 | Temp 98.1°F

## 2011-11-06 DIAGNOSIS — Z5112 Encounter for antineoplastic immunotherapy: Secondary | ICD-10-CM

## 2011-11-06 DIAGNOSIS — I82409 Acute embolism and thrombosis of unspecified deep veins of unspecified lower extremity: Secondary | ICD-10-CM

## 2011-11-06 DIAGNOSIS — I82509 Chronic embolism and thrombosis of unspecified deep veins of unspecified lower extremity: Secondary | ICD-10-CM

## 2011-11-06 DIAGNOSIS — C9000 Multiple myeloma not having achieved remission: Secondary | ICD-10-CM

## 2011-11-06 DIAGNOSIS — Z5181 Encounter for therapeutic drug level monitoring: Secondary | ICD-10-CM

## 2011-11-06 DIAGNOSIS — Z7901 Long term (current) use of anticoagulants: Secondary | ICD-10-CM

## 2011-11-06 LAB — COMPREHENSIVE METABOLIC PANEL
ALT: 12 U/L (ref 0–35)
Alkaline Phosphatase: 49 U/L (ref 39–117)
CO2: 28 mEq/L (ref 19–32)
Creatinine, Ser: 1.03 mg/dL (ref 0.50–1.10)
Total Bilirubin: 0.2 mg/dL — ABNORMAL LOW (ref 0.3–1.2)

## 2011-11-06 LAB — CBC WITH DIFFERENTIAL/PLATELET
EOS%: 0.8 % (ref 0.0–7.0)
Eosinophils Absolute: 0.1 10*3/uL (ref 0.0–0.5)
LYMPH%: 8.1 % — ABNORMAL LOW (ref 14.0–49.7)
MCH: 28.8 pg (ref 25.1–34.0)
MCHC: 32.4 g/dL (ref 31.5–36.0)
MCV: 88.9 fL (ref 79.5–101.0)
MONO%: 11.7 % (ref 0.0–14.0)
Platelets: 223 10*3/uL (ref 145–400)
RBC: 3.86 10*6/uL (ref 3.70–5.45)
nRBC: 0 % (ref 0–0)

## 2011-11-06 LAB — LACTATE DEHYDROGENASE: LDH: 228 U/L (ref 94–250)

## 2011-11-06 LAB — PROTIME-INR
INR: 2.2 (ref 2.00–3.50)
Protime: 26.4 Seconds — ABNORMAL HIGH (ref 10.6–13.4)

## 2011-11-06 MED ORDER — BORTEZOMIB CHEMO IV INJECTION 3.5 MG
1.3000 mg/m2 | Freq: Once | INTRAMUSCULAR | Status: AC
  Administered 2011-11-06: 2 mg via INTRAVENOUS
  Filled 2011-11-06: qty 2

## 2011-11-06 MED ORDER — SODIUM CHLORIDE 0.9 % IV SOLN
Freq: Once | INTRAVENOUS | Status: AC
  Administered 2011-11-06: 15:00:00 via INTRAVENOUS

## 2011-11-06 MED ORDER — ONDANSETRON 8 MG/50ML IVPB (CHCC)
8.0000 mg | Freq: Once | INTRAVENOUS | Status: AC
  Administered 2011-11-06: 8 mg via INTRAVENOUS

## 2011-11-06 NOTE — Progress Notes (Signed)
INR therapeutic today (2.2).  No problems.  No changes in meds, diet.  Continue 5mg  daily, and recheck INR in 1 week with next chemotherapy appt.

## 2011-11-08 ENCOUNTER — Other Ambulatory Visit: Payer: Self-pay | Admitting: Family Medicine

## 2011-11-11 ENCOUNTER — Other Ambulatory Visit: Payer: Self-pay | Admitting: Internal Medicine

## 2011-11-13 ENCOUNTER — Ambulatory Visit (HOSPITAL_BASED_OUTPATIENT_CLINIC_OR_DEPARTMENT_OTHER): Payer: Medicare Other | Admitting: Pharmacist

## 2011-11-13 ENCOUNTER — Ambulatory Visit (HOSPITAL_BASED_OUTPATIENT_CLINIC_OR_DEPARTMENT_OTHER): Payer: Medicare Other | Admitting: Internal Medicine

## 2011-11-13 ENCOUNTER — Telehealth: Payer: Self-pay | Admitting: Internal Medicine

## 2011-11-13 ENCOUNTER — Other Ambulatory Visit: Payer: Self-pay | Admitting: Internal Medicine

## 2011-11-13 ENCOUNTER — Ambulatory Visit (HOSPITAL_BASED_OUTPATIENT_CLINIC_OR_DEPARTMENT_OTHER): Payer: Medicare Other

## 2011-11-13 ENCOUNTER — Other Ambulatory Visit (HOSPITAL_BASED_OUTPATIENT_CLINIC_OR_DEPARTMENT_OTHER): Payer: Medicare Other | Admitting: Lab

## 2011-11-13 VITALS — BP 113/72 | HR 78 | Temp 97.7°F | Ht 61.0 in | Wt 118.4 lb

## 2011-11-13 DIAGNOSIS — Z86718 Personal history of other venous thrombosis and embolism: Secondary | ICD-10-CM

## 2011-11-13 DIAGNOSIS — Z7901 Long term (current) use of anticoagulants: Secondary | ICD-10-CM

## 2011-11-13 DIAGNOSIS — C9000 Multiple myeloma not having achieved remission: Secondary | ICD-10-CM

## 2011-11-13 DIAGNOSIS — I82409 Acute embolism and thrombosis of unspecified deep veins of unspecified lower extremity: Secondary | ICD-10-CM

## 2011-11-13 DIAGNOSIS — Z5112 Encounter for antineoplastic immunotherapy: Secondary | ICD-10-CM

## 2011-11-13 DIAGNOSIS — C3490 Malignant neoplasm of unspecified part of unspecified bronchus or lung: Secondary | ICD-10-CM

## 2011-11-13 LAB — CBC WITH DIFFERENTIAL/PLATELET
Basophils Absolute: 0 10*3/uL (ref 0.0–0.1)
EOS%: 1.6 % (ref 0.0–7.0)
Eosinophils Absolute: 0.1 10*3/uL (ref 0.0–0.5)
HCT: 36.8 % (ref 34.8–46.6)
HGB: 11.8 g/dL (ref 11.6–15.9)
MONO#: 0.7 10*3/uL (ref 0.1–0.9)
NEUT#: 4.2 10*3/uL (ref 1.5–6.5)
RDW: 14.6 % — ABNORMAL HIGH (ref 11.2–14.5)
lymph#: 0.6 10*3/uL — ABNORMAL LOW (ref 0.9–3.3)

## 2011-11-13 LAB — COMPREHENSIVE METABOLIC PANEL
Albumin: 3.7 g/dL (ref 3.5–5.2)
Alkaline Phosphatase: 53 U/L (ref 39–117)
CO2: 28 mEq/L (ref 19–32)
Calcium: 9.1 mg/dL (ref 8.4–10.5)
Chloride: 103 mEq/L (ref 96–112)
Glucose, Bld: 159 mg/dL — ABNORMAL HIGH (ref 70–99)
Potassium: 3.8 mEq/L (ref 3.5–5.3)
Sodium: 140 mEq/L (ref 135–145)
Total Protein: 5.9 g/dL — ABNORMAL LOW (ref 6.0–8.3)

## 2011-11-13 LAB — PROTIME-INR: INR: 2.4 (ref 2.00–3.50)

## 2011-11-13 LAB — POCT INR: INR: 2.4

## 2011-11-13 MED ORDER — BORTEZOMIB CHEMO IV INJECTION 3.5 MG
1.3000 mg/m2 | Freq: Once | INTRAMUSCULAR | Status: AC
  Administered 2011-11-13: 2 mg via INTRAVENOUS
  Filled 2011-11-13: qty 2

## 2011-11-13 MED ORDER — SODIUM CHLORIDE 0.9 % IV SOLN
Freq: Once | INTRAVENOUS | Status: AC
  Administered 2011-11-13: 12:00:00 via INTRAVENOUS

## 2011-11-13 MED ORDER — ZOLEDRONIC ACID 4 MG/100ML IV SOLN
4.0000 mg | Freq: Once | INTRAVENOUS | Status: DC
  Administered 2011-11-13: 4 mg via INTRAVENOUS
  Filled 2011-11-13: qty 100

## 2011-11-13 MED ORDER — ZOLEDRONIC ACID 4 MG/100ML IV SOLN
4.0000 mg | Freq: Once | INTRAVENOUS | Status: DC
  Filled 2011-11-13: qty 100

## 2011-11-13 MED ORDER — ONDANSETRON 8 MG/50ML IVPB (CHCC)
8.0000 mg | Freq: Once | INTRAVENOUS | Status: AC
  Administered 2011-11-13: 8 mg via INTRAVENOUS

## 2011-11-13 NOTE — Patient Instructions (Signed)
Continue 5 mg daily.  We will recheck your INR next week with your infusion appmt.  The Pharmacist will see you in the infusion area.

## 2011-11-13 NOTE — Progress Notes (Signed)
Continue 5 mg daily.  We will recheck your INR next week with your infusion appmt.  The Pharmacist will see you in the infusion area. 

## 2011-11-13 NOTE — Progress Notes (Signed)
[facility] [phone]   [phone]  OFFICE PROGRESS NOTE  [name], MD, MD [address] [place] [address]  DIAGNOSIS:  1. multiple myeloma diagnosed in September 2009,  2. history of bilateral lower extremity deep vein thrombosis diagnosed in November 2009  3. acute on chronic deep vein thrombosis in the left lower extremity diagnosed in October 2010.   PRIOR THERAPY:  1. status post palliative radiotherapy to the right femur and is she'll tuberosity, first was done in December 2011 and the second treatment was completed Jan 08 2011, and the third treatment to the right distal femur completed on 04/30/2011  2. status post palliative radiotherapy to the left proximal femur completed 02/28/2011.  3. Revlimid 25 mg by mouth daily for 21 days every 4 weeks in addition to oral Decadron at 40 mg by mouth on a weekly basis status post 37 cycles.   CURRENT THERAPY:  1. weekly subcutaneous Velcade 1.3 mg/M2 status post 10 cycles.  2. Coumadin 5 mg on Mondays and Thursdays and 2.5 mg on all other days.  3. Zometa 4 mg IV given every 2 months.  INTERVAL HISTORY: [name] 76 y.o. female returns to the clinic today for followup visit accompanied by her daughter. The patient has no complaints today except for pain in the right lower extremity. She is currently on OxyContin 10 mg by mouth every 12 following the addition to oxycodone on as needed basis. She takes only 2 tablets for breakthrough pain. She denied having any significant weight loss or night sweats. She is tolerating her treatment with subcutaneous Velcade fairly well. She had repeat CBC, comprehensive metabolic panel, LDH and myeloma panel performed recently and she is here for evaluation and discussion of her lab results.  MEDICAL HISTORY: Past Medical History  Diagnosis Date  . Blood transfusion   . Depression     Mild  . History of chicken pox   . Hyperlipidemia   .  Hypertension   . Degenerative joint disease   . Diabetes mellitus     Steroid related  . Phlebitis   . Multiple myeloma     per Dr. [name], s/p palliative radiation for leg pain 2011 per Dr. Roselind Messier    ALLERGIES:   has no known allergies.  MEDICATIONS:  Current Outpatient Prescriptions  Medication Sig Dispense Refill  . ALPRAZolam (XANAX) 0.5 MG tablet TAKE ONE TABLET BY MOUTH THREE TIMES DAILY AS NEEDED FOR SLEEP OR  ANXIETY  60 tablet  0  . Calcium Carbonate-Vitamin D 600-400 MG-UNIT per tablet Take 1 tablet by mouth 3 (three) times daily with meals.        Marland Kitchen dexamethasone (DECADRON) 4 MG tablet TAKE 10 TABLETS BY MOUTH ONCE A WEEK WITH FOOD AS DIRECTED  40 tablet  1  . glipiZIDE (GLUCOTROL XL) 2.5 MG 24 hr tablet TAKE ONE TABLET BY MOUTH EVERY DAY  90 tablet  2  . ibuprofen (ADVIL,MOTRIN) 200 MG tablet Take 200 mg by mouth every 6 (six) hours as needed.        Marland Kitchen KLOR-CON M20 20 MEQ tablet TAKE ONE TABLET BY MOUTH EVERY DAY  30 each  1  . Lancets (ONETOUCH ULTRASOFT) lancets Test blood sugar once daily or as directed.  100 each  11  . lisinopril-hydrochlorothiazide (PRINZIDE,ZESTORETIC) 20-12.5 MG per tablet TAKE ONE TABLET BY MOUTH EVERY DAY  90 tablet  2  . magnesium oxide (MAG-OX) 400 MG tablet Take  400 mg by mouth daily.       . ONE TOUCH ULTRA TEST test strip TEST BLOOD SUGAR ONCE DAILY OR AS DIRECTED  25 each  6  . oxyCODONE (OXY IR/ROXICODONE) 5 MG immediate release tablet Take 1 1/2 tabs by mouth every 6 hours as needed for pain  90 tablet  0  . oxyCODONE (OXYCONTIN) 10 MG 12 hr tablet Take 1 tablet (10 mg total) by mouth every 12 (twelve) hours. Restarted per Dr Donnald Garre on 10/01/11.  SLJ  60 tablet  0  . prochlorperazine (COMPAZINE) 10 MG tablet Take 1 tablet (10 mg total) by mouth every 6 (six) hours as needed.  30 tablet  1  . temazepam (RESTORIL) 30 MG capsule Take 1 capsule (30 mg total) by mouth at bedtime as needed for sleep.  30 capsule  1  . warfarin (COUMADIN) 5 MG tablet          SURGICAL HISTORY:  Past Surgical History  Procedure Date  . Abdominal hysterectomy     REVIEW OF SYSTEMS:  A comprehensive review of systems was negative except for: Constitutional: positive for fatigue Musculoskeletal: positive for bone pain   PHYSICAL EXAMINATION: General appearance: alert, cooperative and no distress Head: Normocephalic, without obvious abnormality, atraumatic Neck: no adenopathy Resp: clear to auscultation bilaterally Cardio: regular rate and rhythm, S1, S2 normal, no murmur, click, rub or gallop GI: soft, non-tender; bowel sounds normal; no masses,  no organomegaly Extremities: extremities normal, atraumatic, no cyanosis or edema Neurologic: Alert and oriented X 3, normal strength and tone. Normal symmetric reflexes. Normal coordination and gait  ECOG PERFORMANCE STATUS: 1 - Symptomatic but completely ambulatory  Blood pressure 113/72, pulse 78, temperature 97.7 F (36.5 C), temperature source Oral, height 5\' 1"  (1.549 m), weight 118 lb 6.4 oz (53.706 kg).  LABORATORY DATA: Lab Results  Component Value Date   WBC 5.5 11/13/2011   HGB 11.8 11/13/2011   HCT 36.8 11/13/2011   MCV 89.1 11/13/2011   PLT 191 11/13/2011      Chemistry      Component Value Date/Time   NA 142 11/06/2011 1349   K 4.4 11/06/2011 1349   CL 104 11/06/2011 1349   CO2 28 11/06/2011 1349   BUN 17 11/06/2011 1349   CREATININE 1.03 11/06/2011 1349      Component Value Date/Time   CALCIUM 9.2 11/06/2011 1349   CALCIUM  Value: 5.7 CRITICAL RESULT CALLED TO, READ BACK BY AND VERIFIED WITH: JAMIE TRACY,RN [id number] @ 1433 BY J SCOTTON (NOTE)  Amended report. Result repeated and verified. CORRECTED ON 10/14 AT 1429: PREVIOUSLY REPORTED AS Result repeated and verified.* 06/13/2008 1605   ALKPHOS 49 11/06/2011 1349   AST 15 11/06/2011 1349   ALT 12 11/06/2011 1349   BILITOT 0.2* 11/06/2011 1349     Other lab results: Free kappa light chain 1.09, free lambda light chain 4.76, tablet/lambda ratio 0.23.  Beta-2 microglobulin 2.09, IgG 672, IgA 152, IgM 150.   RADIOGRAPHIC STUDIES: No results found.  ASSESSMENT: This is a very pleasant 76 years old [place] American female with a recurrent multiple myeloma currently on treatment with subcutaneous Velcade and Decadron. The patient is tolerating her treatment fairly well and she has a response to first-9  weeks of her treatment.   PLAN: I discussed the lab result with the patient and her daughter. I recommended for her to continue the same treatment regimen for now.  For pain management, she would continue on OxyContin and oxycodone.  The patient will come back for followup visit in 3 week for evaluation and management any adverse effect of her treatment.  All questions were answered. The patient knows to call the clinic with any problems, questions or concerns. We can certainly see the patient much sooner if necessary.  I spent 20 minutes counseling the patient face to face. The total time spent in the appointment was 30 minutes.

## 2011-11-13 NOTE — Telephone Encounter (Signed)
gv pts daughter appts for march-april2013

## 2011-11-18 ENCOUNTER — Other Ambulatory Visit: Payer: Self-pay | Admitting: *Deleted

## 2011-11-18 MED ORDER — OXYCODONE HCL 10 MG PO TB12: 10.0000 mg | ORAL_TABLET | Freq: Two times a day (BID) | ORAL | Status: DC

## 2011-11-19 ENCOUNTER — Other Ambulatory Visit: Payer: Self-pay | Admitting: Certified Registered Nurse Anesthetist

## 2011-11-19 DIAGNOSIS — G4700 Insomnia, unspecified: Secondary | ICD-10-CM

## 2011-11-19 MED ORDER — TEMAZEPAM 30 MG PO CAPS: 30.0000 mg | ORAL_CAPSULE | Freq: Every evening | ORAL | Status: DC | PRN

## 2011-11-20 ENCOUNTER — Other Ambulatory Visit: Payer: Self-pay | Admitting: Internal Medicine

## 2011-11-20 ENCOUNTER — Ambulatory Visit (HOSPITAL_BASED_OUTPATIENT_CLINIC_OR_DEPARTMENT_OTHER): Payer: Medicare Other | Admitting: Pharmacist

## 2011-11-20 ENCOUNTER — Ambulatory Visit (HOSPITAL_BASED_OUTPATIENT_CLINIC_OR_DEPARTMENT_OTHER): Payer: Medicare Other

## 2011-11-20 ENCOUNTER — Other Ambulatory Visit (HOSPITAL_BASED_OUTPATIENT_CLINIC_OR_DEPARTMENT_OTHER): Payer: Medicare Other | Admitting: Lab

## 2011-11-20 VITALS — BP 157/74 | HR 67 | Temp 98.8°F

## 2011-11-20 DIAGNOSIS — Z5112 Encounter for antineoplastic immunotherapy: Secondary | ICD-10-CM

## 2011-11-20 DIAGNOSIS — I82409 Acute embolism and thrombosis of unspecified deep veins of unspecified lower extremity: Secondary | ICD-10-CM

## 2011-11-20 DIAGNOSIS — C9000 Multiple myeloma not having achieved remission: Secondary | ICD-10-CM

## 2011-11-20 DIAGNOSIS — Z5181 Encounter for therapeutic drug level monitoring: Secondary | ICD-10-CM

## 2011-11-20 DIAGNOSIS — I82509 Chronic embolism and thrombosis of unspecified deep veins of unspecified lower extremity: Secondary | ICD-10-CM

## 2011-11-20 DIAGNOSIS — Z7901 Long term (current) use of anticoagulants: Secondary | ICD-10-CM

## 2011-11-20 LAB — CBC WITH DIFFERENTIAL/PLATELET
BASO%: 0.2 % (ref 0.0–2.0)
Eosinophils Absolute: 0.1 10*3/uL (ref 0.0–0.5)
HCT: 35.3 % (ref 34.8–46.6)
MCHC: 32.3 g/dL (ref 31.5–36.0)
MONO#: 0.5 10*3/uL (ref 0.1–0.9)
NEUT#: 3.7 10*3/uL (ref 1.5–6.5)
NEUT%: 74.8 % (ref 38.4–76.8)
WBC: 4.9 10*3/uL (ref 3.9–10.3)
lymph#: 0.6 10*3/uL — ABNORMAL LOW (ref 0.9–3.3)

## 2011-11-20 LAB — COMPREHENSIVE METABOLIC PANEL
ALT: 13 U/L (ref 0–35)
CO2: 26 mEq/L (ref 19–32)
Calcium: 9 mg/dL (ref 8.4–10.5)
Chloride: 108 mEq/L (ref 96–112)
Creatinine, Ser: 0.93 mg/dL (ref 0.50–1.10)
Sodium: 142 mEq/L (ref 135–145)
Total Protein: 6.1 g/dL (ref 6.0–8.3)

## 2011-11-20 LAB — LACTATE DEHYDROGENASE: LDH: 251 U/L — ABNORMAL HIGH (ref 94–250)

## 2011-11-20 LAB — POCT INR: INR: 3

## 2011-11-20 MED ORDER — BORTEZOMIB CHEMO IV INJECTION 3.5 MG
1.3000 mg/m2 | Freq: Once | INTRAMUSCULAR | Status: AC
  Administered 2011-11-20: 2 mg via INTRAVENOUS
  Filled 2011-11-20: qty 2

## 2011-11-20 MED ORDER — SODIUM CHLORIDE 0.9 % IV SOLN
Freq: Once | INTRAVENOUS | Status: AC
  Administered 2011-11-20: 100 mL via INTRAVENOUS

## 2011-11-20 MED ORDER — ONDANSETRON 8 MG/50ML IVPB (CHCC)
8.0000 mg | Freq: Once | INTRAVENOUS | Status: AC
  Administered 2011-11-20: 8 mg via INTRAVENOUS

## 2011-11-20 NOTE — Progress Notes (Signed)
Pt in therapeutic range.  No changes to report.  Continue on 5 mg daily and recheck with infusion next week. 

## 2011-11-20 NOTE — Patient Instructions (Signed)
Pt in therapeutic range.  No changes to report.  Continue on 5 mg daily and recheck with infusion next week.

## 2011-11-27 ENCOUNTER — Ambulatory Visit (HOSPITAL_BASED_OUTPATIENT_CLINIC_OR_DEPARTMENT_OTHER): Payer: Medicare Other | Admitting: Lab

## 2011-11-27 ENCOUNTER — Ambulatory Visit (HOSPITAL_BASED_OUTPATIENT_CLINIC_OR_DEPARTMENT_OTHER): Payer: Medicare Other

## 2011-11-27 ENCOUNTER — Ambulatory Visit: Payer: Medicare Other | Admitting: Pharmacist

## 2011-11-27 VITALS — BP 127/75 | HR 74 | Temp 98.2°F

## 2011-11-27 DIAGNOSIS — I82509 Chronic embolism and thrombosis of unspecified deep veins of unspecified lower extremity: Secondary | ICD-10-CM

## 2011-11-27 DIAGNOSIS — I82409 Acute embolism and thrombosis of unspecified deep veins of unspecified lower extremity: Secondary | ICD-10-CM

## 2011-11-27 DIAGNOSIS — Z7901 Long term (current) use of anticoagulants: Secondary | ICD-10-CM

## 2011-11-27 DIAGNOSIS — Z5181 Encounter for therapeutic drug level monitoring: Secondary | ICD-10-CM

## 2011-11-27 DIAGNOSIS — Z5112 Encounter for antineoplastic immunotherapy: Secondary | ICD-10-CM

## 2011-11-27 DIAGNOSIS — C9000 Multiple myeloma not having achieved remission: Secondary | ICD-10-CM

## 2011-11-27 LAB — CBC WITH DIFFERENTIAL/PLATELET
Basophils Absolute: 0 10*3/uL (ref 0.0–0.1)
HCT: 35.3 % (ref 34.8–46.6)
HGB: 11.6 g/dL (ref 11.6–15.9)
MONO#: 0.4 10*3/uL (ref 0.1–0.9)
NEUT%: 74.9 % (ref 38.4–76.8)
WBC: 3.9 10*3/uL (ref 3.9–10.3)
lymph#: 0.4 10*3/uL — ABNORMAL LOW (ref 0.9–3.3)

## 2011-11-27 LAB — COMPREHENSIVE METABOLIC PANEL
BUN: 14 mg/dL (ref 6–23)
CO2: 30 mEq/L (ref 19–32)
Calcium: 9.1 mg/dL (ref 8.4–10.5)
Chloride: 103 mEq/L (ref 96–112)
Creatinine, Ser: 0.99 mg/dL (ref 0.50–1.10)

## 2011-11-27 LAB — LACTATE DEHYDROGENASE: LDH: 215 U/L (ref 94–250)

## 2011-11-27 LAB — PROTIME-INR: INR: 2.5 (ref 2.00–3.50)

## 2011-11-27 MED ORDER — BORTEZOMIB CHEMO IV INJECTION 3.5 MG
1.3000 mg/m2 | Freq: Once | INTRAMUSCULAR | Status: AC
  Administered 2011-11-27: 2 mg via INTRAVENOUS
  Filled 2011-11-27: qty 2

## 2011-11-27 MED ORDER — ONDANSETRON 8 MG/50ML IVPB (CHCC)
8.0000 mg | Freq: Once | INTRAVENOUS | Status: AC
  Administered 2011-11-27: 8 mg via INTRAVENOUS

## 2011-11-27 MED ORDER — SODIUM CHLORIDE 0.9 % IV SOLN
Freq: Once | INTRAVENOUS | Status: AC
  Administered 2011-11-27: 14:00:00 via INTRAVENOUS

## 2011-11-27 NOTE — Patient Instructions (Signed)
Patient discharged with no complaints; discharged home with daughter.

## 2011-11-27 NOTE — Progress Notes (Signed)
INR therapeutic (2.5) on 5mg  daily.  No complaints.  No changes.  Will continue current dose, and recheck INR with her infusion appt on 12/11/11.

## 2011-12-01 ENCOUNTER — Other Ambulatory Visit: Payer: Self-pay | Admitting: Certified Registered Nurse Anesthetist

## 2011-12-02 ENCOUNTER — Encounter: Payer: Self-pay | Admitting: *Deleted

## 2011-12-02 ENCOUNTER — Other Ambulatory Visit: Payer: Self-pay | Admitting: *Deleted

## 2011-12-02 DIAGNOSIS — C9000 Multiple myeloma not having achieved remission: Secondary | ICD-10-CM

## 2011-12-02 MED ORDER — OXYCODONE HCL 5 MG PO TABS: ORAL_TABLET | ORAL | Status: DC

## 2011-12-03 ENCOUNTER — Other Ambulatory Visit: Payer: Self-pay | Admitting: Family Medicine

## 2011-12-03 DIAGNOSIS — Z1231 Encounter for screening mammogram for malignant neoplasm of breast: Secondary | ICD-10-CM

## 2011-12-04 ENCOUNTER — Other Ambulatory Visit: Payer: Medicare Other

## 2011-12-04 ENCOUNTER — Telehealth: Payer: Self-pay | Admitting: Internal Medicine

## 2011-12-04 ENCOUNTER — Ambulatory Visit (HOSPITAL_BASED_OUTPATIENT_CLINIC_OR_DEPARTMENT_OTHER): Payer: Medicare Other | Admitting: Physician Assistant

## 2011-12-04 ENCOUNTER — Other Ambulatory Visit (HOSPITAL_BASED_OUTPATIENT_CLINIC_OR_DEPARTMENT_OTHER): Payer: Medicare Other | Admitting: Lab

## 2011-12-04 ENCOUNTER — Encounter: Payer: Self-pay | Admitting: Physician Assistant

## 2011-12-04 ENCOUNTER — Ambulatory Visit (HOSPITAL_COMMUNITY)
Admission: RE | Admit: 2011-12-04 | Discharge: 2011-12-04 | Disposition: A | Discharge status: Home / Self Care | Payer: Medicare Other | Source: Ambulatory Visit | Attending: Physician Assistant | Admitting: Physician Assistant

## 2011-12-04 ENCOUNTER — Ambulatory Visit: Payer: Medicare Other | Admitting: Physician Assistant

## 2011-12-04 ENCOUNTER — Ambulatory Visit (HOSPITAL_BASED_OUTPATIENT_CLINIC_OR_DEPARTMENT_OTHER): Payer: Medicare Other

## 2011-12-04 VITALS — BP 121/76 | HR 81 | Temp 97.4°F | Ht 61.0 in | Wt 121.3 lb

## 2011-12-04 DIAGNOSIS — C9000 Multiple myeloma not having achieved remission: Secondary | ICD-10-CM

## 2011-12-04 DIAGNOSIS — M79609 Pain in unspecified limb: Secondary | ICD-10-CM | POA: Insufficient documentation

## 2011-12-04 DIAGNOSIS — Z5181 Encounter for therapeutic drug level monitoring: Secondary | ICD-10-CM

## 2011-12-04 DIAGNOSIS — Z5112 Encounter for antineoplastic immunotherapy: Secondary | ICD-10-CM

## 2011-12-04 DIAGNOSIS — Z7901 Long term (current) use of anticoagulants: Secondary | ICD-10-CM

## 2011-12-04 DIAGNOSIS — I82409 Acute embolism and thrombosis of unspecified deep veins of unspecified lower extremity: Secondary | ICD-10-CM

## 2011-12-04 DIAGNOSIS — K5900 Constipation, unspecified: Secondary | ICD-10-CM

## 2011-12-04 DIAGNOSIS — I82509 Chronic embolism and thrombosis of unspecified deep veins of unspecified lower extremity: Secondary | ICD-10-CM

## 2011-12-04 LAB — COMPREHENSIVE METABOLIC PANEL
AST: 17 U/L (ref 0–37)
Albumin: 3.5 g/dL (ref 3.5–5.2)
Alkaline Phosphatase: 52 U/L (ref 39–117)
BUN: 15 mg/dL (ref 6–23)
Creatinine, Ser: 1.04 mg/dL (ref 0.50–1.10)
Potassium: 3.8 mEq/L (ref 3.5–5.3)

## 2011-12-04 LAB — CBC WITH DIFFERENTIAL/PLATELET
BASO%: 0.2 % (ref 0.0–2.0)
EOS%: 1.9 % (ref 0.0–7.0)
HCT: 35.1 % (ref 34.8–46.6)
MCH: 28.9 pg (ref 25.1–34.0)
MCHC: 32.2 g/dL (ref 31.5–36.0)
NEUT%: 75 % (ref 38.4–76.8)
RDW: 15.7 % — ABNORMAL HIGH (ref 11.2–14.5)
lymph#: 0.5 10*3/uL — ABNORMAL LOW (ref 0.9–3.3)

## 2011-12-04 LAB — PROTIME-INR: INR: 3.1 (ref 2.00–3.50)

## 2011-12-04 MED ORDER — BORTEZOMIB CHEMO IV INJECTION 3.5 MG
1.3000 mg/m2 | Freq: Once | INTRAMUSCULAR | Status: AC
  Administered 2011-12-04: 2 mg via INTRAVENOUS
  Filled 2011-12-04: qty 2

## 2011-12-04 MED ORDER — ONDANSETRON 8 MG/50ML IVPB (CHCC)
8.0000 mg | Freq: Once | INTRAVENOUS | Status: AC
  Administered 2011-12-04: 8 mg via INTRAVENOUS

## 2011-12-04 MED ORDER — SODIUM CHLORIDE 0.9 % IV SOLN
Freq: Once | INTRAVENOUS | Status: AC
  Administered 2011-12-04: 13:00:00 via INTRAVENOUS

## 2011-12-04 NOTE — Telephone Encounter (Signed)
gve the pt her April 2013 appt calendar 

## 2011-12-05 ENCOUNTER — Encounter: Payer: Self-pay | Admitting: *Deleted

## 2011-12-05 ENCOUNTER — Telehealth: Payer: Self-pay | Admitting: Internal Medicine

## 2011-12-05 NOTE — Telephone Encounter (Signed)
S/w pt dtr re wkly appts w/next appt for 4/11 @ 10:45 am. dtr kimberly given each appt d/t for 4/11 thru 5/9

## 2011-12-05 NOTE — Progress Notes (Signed)
[facility] [phone]   [phone]  OFFICE PROGRESS NOTE  [name], MD, MD [address] [place] [address]  DIAGNOSIS:  1. multiple myeloma diagnosed in September 2009,  2. history of bilateral lower extremity deep vein thrombosis diagnosed in November 2009  3. acute on chronic deep vein thrombosis in the left lower extremity diagnosed in October 2010.   PRIOR THERAPY:  1. status post palliative radiotherapy to the right femur and is she'll tuberosity, first was done in December 2011 and the second treatment was completed Jan 08 2011, and the third treatment to the right distal femur completed on 04/30/2011  2. status post palliative radiotherapy to the left proximal femur completed 02/28/2011.  3. Revlimid 25 mg by mouth daily for 21 days every 4 weeks in addition to oral Decadron at 40 mg by mouth on a weekly basis status post 37 cycles.   CURRENT THERAPY:  1. weekly subcutaneous Velcade 1.3 mg/M2 status post 10 cycles.  2. Coumadin 5 mg on Mondays and Thursdays and 2.5 mg on all other days.  3. Zometa 4 mg IV given every 2 months.  INTERVAL HISTORY: [name] 76 y.o. female returns to the clinic today for followup visit accompanied by her daughter. The patient has no complaints today except for continued pain in the right lower extremity as well as bilateral arm pain worse in the right than on the left that has been present for the past few weeks. She is currently taking OxyContin 10 mg every 12 hours and the opposite 12 hour she's taking 7.5 mg of oxycodone this is not adequately address in her current level of pain. She does report some mild constipation voiced no other complaints. She denied having any significant weight loss or night sweats. She is tolerating her treatment with subcutaneous Velcade fairly well.   MEDICAL HISTORY: Past Medical History  Diagnosis Date  . Blood transfusion   . Depression     Mild  .  History of chicken pox   . Hyperlipidemia   . Hypertension   . Degenerative joint disease   . Diabetes mellitus     Steroid related  . Phlebitis   . Multiple myeloma     per Dr. [name], s/p palliative radiation for leg pain 2011 per Dr. Roselind Messier    ALLERGIES:   has no known allergies.  MEDICATIONS:  Current Outpatient Prescriptions  Medication Sig Dispense Refill  . ALPRAZolam (XANAX) 0.5 MG tablet TAKE ONE TABLET BY MOUTH THREE TIMES DAILY AS NEEDED FOR SLEEP OR  ANXIETY  60 tablet  0  . Calcium Carbonate-Vitamin D 600-400 MG-UNIT per tablet Take 1 tablet by mouth 3 (three) times daily with meals.        Marland Kitchen dexamethasone (DECADRON) 4 MG tablet TAKE 10 TABLETS BY MOUTH ONCE A WEEK WITH FOOD AS DIRECTED  40 tablet  1  . glipiZIDE (GLUCOTROL XL) 2.5 MG 24 hr tablet TAKE ONE TABLET BY MOUTH EVERY DAY  90 tablet  2  . ibuprofen (ADVIL,MOTRIN) 200 MG tablet Take 200 mg by mouth every 6 (six) hours as needed.        Marland Kitchen KLOR-CON M20 20 MEQ tablet TAKE ONE TABLET BY MOUTH EVERY DAY  30 each  1  . Lancets (ONETOUCH ULTRASOFT) lancets Test blood sugar once daily or as directed.  100 each  11  . lisinopril-hydrochlorothiazide (PRINZIDE,ZESTORETIC) 20-12.5 MG per tablet TAKE ONE TABLET BY MOUTH EVERY DAY  90 tablet  2  . magnesium oxide (MAG-OX) 400 MG tablet Take 400 mg by mouth daily.       . ONE TOUCH ULTRA TEST test strip TEST BLOOD SUGAR ONCE DAILY OR AS DIRECTED  25 each  6  . oxyCODONE (OXY IR/ROXICODONE) 5 MG immediate release tablet Take 1 1/2 tabs by mouth every 6 hours as needed for pain  90 tablet  0  . oxyCODONE (OXYCONTIN) 10 MG 12 hr tablet Take 1 tablet (10 mg total) by mouth every 12 (twelve) hours.  60 tablet  0  . prochlorperazine (COMPAZINE) 10 MG tablet Take 1 tablet (10 mg total) by mouth every 6 (six) hours as needed.  30 tablet  1  . temazepam (RESTORIL) 30 MG capsule Take 1 capsule (30 mg total) by mouth at bedtime as needed for sleep.  30 capsule  0  . warfarin (COUMADIN) 5  MG tablet        No current facility-administered medications for this visit.   Facility-Administered Medications Ordered in Other Visits  Medication Dose Route Frequency Provider Last Rate Last Dose  . 0.9 %  sodium chloride infusion   Intravenous Once Si Gaul, MD      . bortezomib IV (VELCADE) chemo injection 2 mg  1.3 mg/m2 (Treatment Plan Actual) Intravenous Once Si Gaul, MD   2 mg at 12/04/11 1302  . ondansetron (ZOFRAN) IVPB 8 mg  8 mg Intravenous Once Si Gaul, MD   8 mg at 12/04/11 1246  . Zoledronic Acid (ZOMETA) 4 mg IVPB  4 mg Intravenous Once Si Gaul, MD        SURGICAL HISTORY:  Past Surgical History  Procedure Date  . Abdominal hysterectomy     REVIEW OF SYSTEMS:  A comprehensive review of systems was negative except for: Constitutional: positive for fatigue Gastrointestinal: positive for constipation Musculoskeletal: positive for bone pain and Now with new sites affecting her upper extremities more so on the right than on the left   PHYSICAL EXAMINATION: General appearance: alert, cooperative and no distress Head: Normocephalic, without obvious abnormality, atraumatic Neck: no adenopathy Resp: clear to auscultation bilaterally Cardio: regular rate and rhythm, S1, S2 normal, no murmur, click, rub or gallop GI: soft, non-tender; bowel sounds normal; no masses,  no organomegaly Extremities: extremities normal, atraumatic, no cyanosis or edema Neurologic: Alert and oriented X 3, normal strength and tone. Normal symmetric reflexes. Normal coordination and gait  ECOG PERFORMANCE STATUS: 1 - Symptomatic but completely ambulatory  Blood pressure 121/76, pulse 81, temperature 97.4 F (36.3 C), temperature source Oral, height 5\' 1"  (1.549 m), weight 121 lb 4.8 oz (55.021 kg).  LABORATORY DATA: Lab Results  Component Value Date   WBC 3.8* 12/04/2011   HGB 11.3* 12/04/2011   HCT 35.1 12/04/2011   MCV 89.7 12/04/2011   PLT 196 12/04/2011       Chemistry      Component Value Date/Time   NA 140 12/04/2011 1137   K 3.8 12/04/2011 1137   CL 100 12/04/2011 1137   CO2 31 12/04/2011 1137   BUN 15 12/04/2011 1137   CREATININE 1.04 12/04/2011 1137      Component Value Date/Time   CALCIUM 9.2 12/04/2011 1137   CALCIUM  Value: 5.7 CRITICAL RESULT CALLED TO, READ BACK BY AND VERIFIED WITH: JAMIE TRACY,RN [id number] @ 1433 BY J SCOTTON (NOTE)  Amended report. Result repeated and verified. CORRECTED ON 10/14 AT 1429: PREVIOUSLY REPORTED AS Result repeated and verified.* 06/13/2008 1605  ALKPHOS 52 12/04/2011 1137   AST 17 12/04/2011 1137   ALT 16 12/04/2011 1137   BILITOT 0.3 12/04/2011 1137     Other lab results: Free kappa light chain 1.09, free lambda light chain 4.76, tablet/lambda ratio 0.23. Beta-2 microglobulin 2.09, IgG 672, IgA 152, IgM 150.   RADIOGRAPHIC STUDIES: Dg Humerus Left  12/04/2011  *RADIOLOGY REPORT*  Clinical Data: Multiple myeloma, pain.  LEFT HUMERUS - 2+ VIEW  Comparison: None.  Findings: There are multiple radiolucencies seen throughout the humerus with some endosteal cortex scalloping consistent with myeloma involvement.  There is no pathologic fracture  IMPRESSION: Findings consistent with myeloma involvement left humerus.  Original Report Authenticated By: [name], M.D.   Dg Humerus Right  12/04/2011  *RADIOLOGY REPORT*  Clinical Data: Multiple myeloma with bilateral humerus pain.  RIGHT HUMERUS - 2+ VIEW  Comparison: None.  Findings: Multiple areas of radiolucency are seen within the humeral shaft scalloping the endosteal cortex consistent with myeloma involvement.  There is no pathologic fracture.  IMPRESSION: Findings consistent with myeloma involvement of the right humerus.  Original Report Authenticated By: [name], M.D.   ASSESSMENT/PLAN: This is a very pleasant 76 years old [place] American female with a recurrent multiple myeloma currently on treatment with subcutaneous Velcade and Decadron. The patient is tolerating  her treatment fairly well and she has a response to first-9  weeks of her treatment. Patient was discussed with Dr. [name]. We will obtain plain films of her right and left humerus to look for lesions related to her multiple myeloma. Should the x-rays be positive we will refer her to to radiation oncology for possible palliative radiotherapy for symptom management/pain relief. In the interim she is to increase her breakthrough pain medication to 7.5 mg every 6 hours as needed and continue her OxyContin at 10 mg by mouth every 12 hours. Her PT/INR and Coumadin therapy continue to be monitored by the [facility]. She'll return in 3 weeks for another symptom management visit with a repeat CBC differential C. met and LDH.  [name], Lindsay Bateson E, PA-C  All questions were answered. The patient knows to call the clinic with any problems, questions or concerns. We can certainly see the patient much sooner if necessary.

## 2011-12-05 NOTE — Telephone Encounter (Signed)
Also s/w kim[place]ly re appt for 4/8 @ 12:30 pm w/Dr. Roselind Messier.

## 2011-12-08 ENCOUNTER — Ambulatory Visit
Admission: RE | Admit: 2011-12-08 | Discharge: 2011-12-08 | Disposition: A | Discharge status: Home / Self Care | Payer: Medicare Other | Source: Ambulatory Visit | Attending: Radiation Oncology | Admitting: Radiation Oncology

## 2011-12-08 ENCOUNTER — Encounter: Payer: Self-pay | Admitting: Radiation Oncology

## 2011-12-08 VITALS — BP 142/87 | HR 86 | Temp 98.2°F | Wt 123.5 lb

## 2011-12-08 DIAGNOSIS — C9000 Multiple myeloma not having achieved remission: Secondary | ICD-10-CM

## 2011-12-08 DIAGNOSIS — Z510 Encounter for antineoplastic radiation therapy: Secondary | ICD-10-CM | POA: Insufficient documentation

## 2011-12-08 HISTORY — DX: Acute embolism and thrombosis of unspecified deep veins of lower extremity, bilateral: I82.403

## 2011-12-08 NOTE — Progress Notes (Signed)
Reconsultation for consideration of radiation. Patient has had bilateral humerus pain intermittently over the last few months  Which has been more consistent over the last few weeks. Right humerus pain greater than left is greatest upon rising in am.Denies pain currently as controlled with oxycontin 10 mg and oxy ir.Bilateral humerus x-rays consistent with myeloma/no pathologic fracture.

## 2011-12-08 NOTE — Progress Notes (Signed)
Please see the Nurse Progress Note in the MD Initial Consult Encounter for this patient. 

## 2011-12-08 NOTE — Progress Notes (Signed)
CC:   Lajuana Matte, M.D.  REFERRING PHYSICIAN:  Lajuana Matte, M.D.  DIAGNOSIS:  Multiple myeloma.  HISTORY OF PRESENT ILLNESS:  [name] is a very pleasant 76 year old female who is seen out courtesy of Dr. [name] for consideration for additional radiation therapy as part of the management of the patient's multiple myeloma.  [name] has received radiation therapy in our department on several occasions with treatments including the right femur as well as retreatment.  She has also had treatments to the lumbosacral spine and left shoulder area as well as the left proximal femur.  The patient has continued to have some pain in the right upper leg region, which she has had for some time.  This seems to be manageable with her current medication.  The patient has been on Revlimid as well as Decadron for her myeloma.  More recently the patient was seen in medical oncology by Tiana Loft, PA-C.  The patient was also complaining of pain in the right upper arm as well as some in the left arm.  The patient proceeded to undergo plain x-rays which showed multiple lucencies within the left humerus consistent with myelomatous involvement.  In addition, the right humerus x-ray also showed multiple lucencies consistent with myeloma.  There was no pathologic fracture noted of either arms.  Given the patient's pain in these areas, radiation therapy has been consulted for consideration for treatment.  PHYSICAL EXAMINATION:  General:  This is a very pleasant 76 year old female in no acute distress.  Vital signs:  Temperature 98.2, pulse 86, blood pressure 142/87.  Weight is 123 pounds.  Examination of the neck and supraclavicular region reveals no evidence of adenopathy.  The axillary areas are free of adenopathy.  Examination of the lungs reveals them to be clear.  The heart has a regular rhythm and rate.  The patient appears to have good range of movement in both upper extremities  with good motor strength proximally and distally.  Palpation along the right upper arm area reveals some tenderness.  There is no palpable mass along the right or left arm.  Examination of the lower extremities reveals motor strength to be 5/5 in the proximal and distal muscle groups.  The patient has much less swelling in her right distal extremity, which was noticed on prior exams.  X-RAY STUDIES:  As summarized in the HPI.  IMPRESSION AND PLAN:  Multiple myeloma.  I have reviewed the patient's most recent plain x-rays and the involvement along the right humerus seems to be most significant.  There are several significant size lucencies, particularly along the mid to distal humerus area.  In further questioning, [name] does admit that her right arm is the most bothersome for her.  In light of this, I would recommend a short course of palliative radiation therapy directed at the right humerus region.  I discussed the overall treatment course, side effects and potential toxicities of radiation therapy in this situation with the patient and her family.  The patient appears to understand and wishes to proceed with planned course of treatments.  Given the patient has multiple treatments to the right femur I would not recommend radiation therapy to this area at this time.  The patient will undergo CT simulation later today and begin her treatments later this week.  I anticipate approximately 10 treatments.    ______________________________ Billie Lade, Ph.D., M.D. JDK/MEDQ  D:  12/08/2011  T:  12/08/2011  Job:  [id number]

## 2011-12-09 NOTE — Progress Notes (Signed)
DIAGNOSIS:  Multiple myeloma.  NARRATIVE:  On December 08, 2011, [name] underwent treatment planning to begin radiation therapy directed at the right humerus.  The patient was placed on the CT simulator table.  A custom AccuForm mold was developed for positioning of the arm and right shoulder for treatment.  The patient then proceeded to undergo CT scan in the treatment position. Under virtual simulation, the areas of involvement along the humerus were noted.  The patient then had setup of an AP and PA field encompassing the target volume.  Custom blocking will be used on both fields to shield uninvolved soft tissue areas.  A computerized isodose plan will be generated for treatment.  TREATMENT PLAN:  The patient is to proceed with daily radiation treatments at 300 cGy per day for 10 treatments for a cumulative dose of 3000 cGy.  6 MV photons will be used to deliver the patient's treatment.    ______________________________ Billie Lade, Ph.D., M.D. JDK/MEDQ  D:  12/09/2011  T:  12/09/2011  Job:  [id number]

## 2011-12-09 NOTE — Progress Notes (Signed)
Encounter addended by: Tessa Lerner, RN on: 12/09/2011  9:03 AM<BR>     Documentation filed: Charges VN

## 2011-12-11 ENCOUNTER — Ambulatory Visit: Payer: Medicare Other | Admitting: Pharmacist

## 2011-12-11 ENCOUNTER — Ambulatory Visit (HOSPITAL_BASED_OUTPATIENT_CLINIC_OR_DEPARTMENT_OTHER): Payer: Medicare Other

## 2011-12-11 ENCOUNTER — Encounter: Payer: Self-pay | Admitting: *Deleted

## 2011-12-11 ENCOUNTER — Other Ambulatory Visit (HOSPITAL_BASED_OUTPATIENT_CLINIC_OR_DEPARTMENT_OTHER): Payer: Medicare Other | Admitting: Lab

## 2011-12-11 VITALS — BP 124/70 | HR 74 | Temp 98.3°F

## 2011-12-11 DIAGNOSIS — C9000 Multiple myeloma not having achieved remission: Secondary | ICD-10-CM

## 2011-12-11 DIAGNOSIS — I82409 Acute embolism and thrombosis of unspecified deep veins of unspecified lower extremity: Secondary | ICD-10-CM

## 2011-12-11 DIAGNOSIS — Z5112 Encounter for antineoplastic immunotherapy: Secondary | ICD-10-CM

## 2011-12-11 LAB — COMPREHENSIVE METABOLIC PANEL
Albumin: 3.6 g/dL (ref 3.5–5.2)
Alkaline Phosphatase: 54 U/L (ref 39–117)
BUN: 17 mg/dL (ref 6–23)
CO2: 25 mEq/L (ref 19–32)
Calcium: 9 mg/dL (ref 8.4–10.5)
Chloride: 107 mEq/L (ref 96–112)
Glucose, Bld: 113 mg/dL — ABNORMAL HIGH (ref 70–99)
Potassium: 4.3 mEq/L (ref 3.5–5.3)
Sodium: 141 mEq/L (ref 135–145)
Total Protein: 6.1 g/dL (ref 6.0–8.3)

## 2011-12-11 LAB — CBC WITH DIFFERENTIAL/PLATELET
Basophils Absolute: 0 10*3/uL (ref 0.0–0.1)
Eosinophils Absolute: 0.1 10*3/uL (ref 0.0–0.5)
HGB: 11.3 g/dL — ABNORMAL LOW (ref 11.6–15.9)
MCV: 88.6 fL (ref 79.5–101.0)
MONO#: 0.5 10*3/uL (ref 0.1–0.9)
MONO%: 10 % (ref 0.0–14.0)
NEUT#: 4.1 10*3/uL (ref 1.5–6.5)
RDW: 15.3 % — ABNORMAL HIGH (ref 11.2–14.5)
WBC: 5.3 10*3/uL (ref 3.9–10.3)

## 2011-12-11 LAB — PROTIME-INR
INR: 3.1 (ref 2.00–3.50)
Protime: 37.2 Seconds — ABNORMAL HIGH (ref 10.6–13.4)

## 2011-12-11 LAB — LACTATE DEHYDROGENASE: LDH: 266 U/L — ABNORMAL HIGH (ref 94–250)

## 2011-12-11 MED ORDER — SODIUM CHLORIDE 0.9 % IV SOLN
Freq: Once | INTRAVENOUS | Status: AC
  Administered 2011-12-11: 12:00:00 via INTRAVENOUS

## 2011-12-11 MED ORDER — ONDANSETRON 8 MG/50ML IVPB (CHCC)
8.0000 mg | Freq: Once | INTRAVENOUS | Status: AC
  Administered 2011-12-11: 8 mg via INTRAVENOUS

## 2011-12-11 MED ORDER — BORTEZOMIB CHEMO IV INJECTION 3.5 MG
1.3000 mg/m2 | Freq: Once | INTRAMUSCULAR | Status: AC
  Administered 2011-12-11: 2 mg via INTRAVENOUS
  Filled 2011-12-11: qty 2

## 2011-12-11 NOTE — Progress Notes (Signed)
[facility] Psychosocial Distress Screening Clinical Social Work  Clinical Social Work was referred by distress screening protocol.  The patient scored a 8 on the Psychosocial Distress Thermometer which indicates severe distress. Clinical Social Worker met with patient and daughter in infusion room to assess for distress and other psychosocial needs. The patient states she is not experiencing any distress at this time, except for pain. She hopes that radiation treatment will ease some of her pain. Her daughter requests that we complete advance directives. We have scheduled to meet next Wednesday, 12/17/11, at 2 pm to complete documents.   Clinical Social Worker follow up needed: yes  If yes, follow up plan: Next week to complete advance directives.  Kathrin Penner, MSW, [facility] Clinical Social Worker [facility] [phone]

## 2011-12-11 NOTE — Patient Instructions (Signed)
[facility] Discharge Instructions for Patients Receiving Chemotherapy  Today you received the following chemotherapy agents Velcade   To help prevent nausea and vomiting after your treatment, we encourage you to take your nausea medication Zofran} Begin taking it at 7pm and take it as often as prescribed for the next 24-48hours.   If you develop nausea and vomiting that is not controlled by your nausea medication, call the clinic. If it is after clinic hours your family physician or the after hours number for the clinic or go to the Emergency Department.   BELOW ARE SYMPTOMS THAT SHOULD BE REPORTED IMMEDIATELY:  *FEVER GREATER THAN 100.5 F  *CHILLS WITH OR WITHOUT FEVER  NAUSEA AND VOMITING THAT IS NOT CONTROLLED WITH YOUR NAUSEA MEDICATION  *UNUSUAL SHORTNESS OF BREATH  *UNUSUAL BRUISING OR BLEEDING  TENDERNESS IN MOUTH AND THROAT WITH OR WITHOUT PRESENCE OF ULCERS  *URINARY PROBLEMS  *BOWEL PROBLEMS  UNUSUAL RASH Items with * indicate a potential emergency and should be followed up as soon as possible.  One of the nurses will contact you 24 hours after your treatment. Please let the nurse know about any problems that you may have experienced. Feel free to call the clinic you have any questions or concerns. The clinic phone number is [phone].   I have been informed and understand all the instructions given to me. I know to contact the clinic, my physician, or go to the Emergency Department if any problems should occur. I do not have any questions at this time, but understand that I may call the clinic during office hours   should I have any questions or need assistance in obtaining follow up care.    __________________________________________  _____________  __________ Signature of Patient or Authorized Representative            Date                   Time    __________________________________________ Nurse's Signature

## 2011-12-11 NOTE — Progress Notes (Signed)
INR slightly supratherapeutic (3.1).  No complaints besides arm pain r/t pathologic fracture.  Will continue current dose of 5mg  daily, and recheck INR in 1 week with next infusion.

## 2011-12-12 ENCOUNTER — Other Ambulatory Visit: Payer: Self-pay | Admitting: Internal Medicine

## 2011-12-12 ENCOUNTER — Other Ambulatory Visit: Payer: Self-pay | Admitting: Physician Assistant

## 2011-12-12 NOTE — Progress Notes (Signed)
Encounter addended by: Tessa Lerner, RN on: 12/12/2011  5:09 PM<BR>     Documentation filed: Charges VN

## 2011-12-15 ENCOUNTER — Other Ambulatory Visit: Payer: Self-pay | Admitting: Internal Medicine

## 2011-12-15 ENCOUNTER — Other Ambulatory Visit: Payer: Self-pay | Admitting: *Deleted

## 2011-12-15 ENCOUNTER — Ambulatory Visit
Admission: RE | Admit: 2011-12-15 | Discharge: 2011-12-15 | Disposition: A | Discharge status: Home / Self Care | Payer: Medicare Other | Source: Ambulatory Visit | Attending: Radiation Oncology | Admitting: Radiation Oncology

## 2011-12-15 DIAGNOSIS — C9000 Multiple myeloma not having achieved remission: Secondary | ICD-10-CM

## 2011-12-15 DIAGNOSIS — G4700 Insomnia, unspecified: Secondary | ICD-10-CM

## 2011-12-15 MED ORDER — TEMAZEPAM 30 MG PO CAPS: 30.0000 mg | ORAL_CAPSULE | Freq: Every evening | ORAL | Status: DC | PRN

## 2011-12-15 NOTE — Progress Notes (Signed)
  Radiation Oncology         [phone] ________________________________  Name: [name] MRN: [id number]  Date: 12/15/2011  DOB: 04/23/1936  Simulation Verification Note  Status: outpatient  Multiple Myeloma  NARRATIVE: The patient was brought to the treatment unit and placed in the planned treatment position. The clinical setup was verified. Then port films were obtained and uploaded to the radiation oncology medical record software.  The treatment beams were carefully compared against the planned radiation fields. The position location and shape of the radiation fields was reviewed. They targeted volume of tissue appears to be appropriately covered by the radiation beams. Organs at risk appear to be excluded as planned.  Based on my personal review, I approved the simulation verification. [name] treatment will proceed as planned.  -----------------------------------  Billie Lade, PhD, MD

## 2011-12-15 NOTE — Telephone Encounter (Signed)
PHARMACY DID NOT RECEIVE THIS PRESCRIPTION.

## 2011-12-16 ENCOUNTER — Ambulatory Visit
Admission: RE | Admit: 2011-12-16 | Discharge: 2011-12-16 | Disposition: A | Discharge status: Home / Self Care | Payer: Medicare Other | Source: Ambulatory Visit | Attending: Radiation Oncology | Admitting: Radiation Oncology

## 2011-12-16 ENCOUNTER — Encounter: Payer: Self-pay | Admitting: Radiation Oncology

## 2011-12-16 VITALS — Wt 124.2 lb

## 2011-12-16 DIAGNOSIS — C9000 Multiple myeloma not having achieved remission: Secondary | ICD-10-CM

## 2011-12-16 NOTE — Progress Notes (Signed)
Here for routine weekly under treat visit with md for radiation to right humerus. Patient has completed 1 of 10 treatments. Denies pain now but does have intermittent pain with movement. Reinforced clinic routine and side effects of treatment with patient and daughter as this is patient's 4 time undergoing radiation treatment.

## 2011-12-16 NOTE — Progress Notes (Signed)
   Department of Radiation Oncology  Phone:  [phone] Fax:        [phone]  Weekly treatment management note  Multiple Myeloma  Treatment Site right humerus  Pt.  is seen today for weekly assessment. She is completed 1 at of 10 planned treatments directed to the right humerus. Patient has completed  300 cGy of a planned 3000 cGy. The patient is tolerating her treatments well at this time without any side effects. She continues to have discomfort and stiffness in her right upper arm.  Examination of right arm reveals no appreciable radiation reaction at this time. The lungs are clear. The heart has a regular rhythm and rate.  Patient is tolerating her treatments well at this time. [name] radiation fields are setting accurately. [name] radiation chart was checked today. Plan is to continue to a cumulative dose of 3000 cGy.  [name], M.D.

## 2011-12-17 ENCOUNTER — Ambulatory Visit
Admission: RE | Admit: 2011-12-17 | Discharge: 2011-12-17 | Disposition: A | Discharge status: Home / Self Care | Payer: Medicare Other | Source: Ambulatory Visit | Attending: Radiation Oncology | Admitting: Radiation Oncology

## 2011-12-18 ENCOUNTER — Ambulatory Visit: Payer: Medicare Other | Admitting: Pharmacist

## 2011-12-18 ENCOUNTER — Other Ambulatory Visit: Payer: Self-pay | Admitting: *Deleted

## 2011-12-18 ENCOUNTER — Ambulatory Visit (HOSPITAL_BASED_OUTPATIENT_CLINIC_OR_DEPARTMENT_OTHER): Payer: Medicare Other

## 2011-12-18 ENCOUNTER — Other Ambulatory Visit (HOSPITAL_BASED_OUTPATIENT_CLINIC_OR_DEPARTMENT_OTHER): Payer: Medicare Other | Admitting: Lab

## 2011-12-18 ENCOUNTER — Ambulatory Visit
Admission: RE | Admit: 2011-12-18 | Discharge: 2011-12-18 | Disposition: A | Discharge status: Home / Self Care | Payer: Medicare Other | Source: Ambulatory Visit | Attending: Radiation Oncology | Admitting: Radiation Oncology

## 2011-12-18 ENCOUNTER — Other Ambulatory Visit: Payer: Self-pay | Admitting: Internal Medicine

## 2011-12-18 VITALS — BP 129/70 | HR 81 | Temp 98.9°F

## 2011-12-18 DIAGNOSIS — C9000 Multiple myeloma not having achieved remission: Secondary | ICD-10-CM

## 2011-12-18 DIAGNOSIS — Z7901 Long term (current) use of anticoagulants: Secondary | ICD-10-CM

## 2011-12-18 DIAGNOSIS — Z5112 Encounter for antineoplastic immunotherapy: Secondary | ICD-10-CM

## 2011-12-18 DIAGNOSIS — I82409 Acute embolism and thrombosis of unspecified deep veins of unspecified lower extremity: Secondary | ICD-10-CM

## 2011-12-18 DIAGNOSIS — C3490 Malignant neoplasm of unspecified part of unspecified bronchus or lung: Secondary | ICD-10-CM

## 2011-12-18 LAB — COMPREHENSIVE METABOLIC PANEL
Albumin: 3.3 g/dL — ABNORMAL LOW (ref 3.5–5.2)
Alkaline Phosphatase: 47 U/L (ref 39–117)
BUN: 13 mg/dL (ref 6–23)
CO2: 27 mEq/L (ref 19–32)
Glucose, Bld: 167 mg/dL — ABNORMAL HIGH (ref 70–99)
Potassium: 3.8 mEq/L (ref 3.5–5.3)
Sodium: 141 mEq/L (ref 135–145)
Total Bilirubin: 0.3 mg/dL (ref 0.3–1.2)
Total Protein: 5.5 g/dL — ABNORMAL LOW (ref 6.0–8.3)

## 2011-12-18 LAB — CBC WITH DIFFERENTIAL/PLATELET
Basophils Absolute: 0 10*3/uL (ref 0.0–0.1)
EOS%: 1.3 % (ref 0.0–7.0)
Eosinophils Absolute: 0.1 10*3/uL (ref 0.0–0.5)
HCT: 34.7 % — ABNORMAL LOW (ref 34.8–46.6)
HGB: 11.2 g/dL — ABNORMAL LOW (ref 11.6–15.9)
MCH: 29.2 pg (ref 25.1–34.0)
MCV: 90.6 fL (ref 79.5–101.0)
MONO%: 10.3 % (ref 0.0–14.0)
NEUT#: 3.7 10*3/uL (ref 1.5–6.5)
NEUT%: 81.8 % — ABNORMAL HIGH (ref 38.4–76.8)
RDW: 16.4 % — ABNORMAL HIGH (ref 11.2–14.5)

## 2011-12-18 MED ORDER — ONDANSETRON 8 MG/50ML IVPB (CHCC)
8.0000 mg | Freq: Once | INTRAVENOUS | Status: AC
  Administered 2011-12-18: 8 mg via INTRAVENOUS

## 2011-12-18 MED ORDER — BORTEZOMIB CHEMO IV INJECTION 3.5 MG
1.3000 mg/m2 | Freq: Once | INTRAMUSCULAR | Status: AC
  Administered 2011-12-18: 2 mg via INTRAVENOUS
  Filled 2011-12-18: qty 2

## 2011-12-18 MED ORDER — OXYCODONE HCL 10 MG PO TB12: 10.0000 mg | ORAL_TABLET | Freq: Two times a day (BID) | ORAL | Status: DC

## 2011-12-18 MED ORDER — SODIUM CHLORIDE 0.9 % IV SOLN
Freq: Once | INTRAVENOUS | Status: AC
  Administered 2011-12-18: 14:00:00 via INTRAVENOUS

## 2011-12-19 ENCOUNTER — Ambulatory Visit
Admission: RE | Admit: 2011-12-19 | Discharge: 2011-12-19 | Disposition: A | Discharge status: Home / Self Care | Payer: Medicare Other | Source: Ambulatory Visit | Attending: Radiation Oncology | Admitting: Radiation Oncology

## 2011-12-22 ENCOUNTER — Ambulatory Visit
Admission: RE | Admit: 2011-12-22 | Discharge: 2011-12-22 | Disposition: A | Discharge status: Home / Self Care | Payer: Medicare Other | Source: Ambulatory Visit | Attending: Radiation Oncology | Admitting: Radiation Oncology

## 2011-12-22 ENCOUNTER — Ambulatory Visit
Admission: RE | Admit: 2011-12-22 | Discharge: 2011-12-22 | Disposition: A | Discharge status: Home / Self Care | Payer: Medicare Other | Source: Ambulatory Visit | Attending: Family Medicine | Admitting: Family Medicine

## 2011-12-22 DIAGNOSIS — Z1231 Encounter for screening mammogram for malignant neoplasm of breast: Secondary | ICD-10-CM

## 2011-12-23 ENCOUNTER — Ambulatory Visit
Admission: RE | Admit: 2011-12-23 | Discharge: 2011-12-23 | Disposition: A | Discharge status: Home / Self Care | Payer: Medicare Other | Source: Ambulatory Visit | Attending: Radiation Oncology | Admitting: Radiation Oncology

## 2011-12-23 ENCOUNTER — Other Ambulatory Visit: Payer: Self-pay | Admitting: *Deleted

## 2011-12-23 DIAGNOSIS — C9000 Multiple myeloma not having achieved remission: Secondary | ICD-10-CM

## 2011-12-23 MED ORDER — GLUCOSE BLOOD VI STRP: ORAL_STRIP | Status: DC

## 2011-12-23 NOTE — Progress Notes (Signed)
NO C/O TODAY BUT SHE DOES HAVE PAIN IN ARM AT TIMES , RATES 6/10 BUT OXYCODONE HELPS.  ALSO SAYS APPETITE POOR BUT SHE HAS GAINED SOME WEIGHT BACK.  SKIN LOOKS GOOD.  ALSO HAS A SPOT ON HER LEFT LEG SHE WANTS LOOKED AT, I LOOKED AT IT BUT WANTS DR TO LOOK AT ALSO.

## 2011-12-23 NOTE — Progress Notes (Signed)
   Department of Radiation Oncology  Phone:  [phone] Fax:        [phone]   Weekly Management Note Current Dose: 18.0  Gy  Projected Dose: 30.0  Gy   Narrative:  The patient presents for routine under treatment assessment. Port film x-rays were reviewed.  The chart was checked.  She is tolerating treatments well without any itching or discomfort in the treatment area. The patient does have some mild fatigue. Overall the patient's pain in the right arm shows improvement over the past week.  Physical Findings: Weight:  . The lungs are clear.  Impression:  The patient is tolerating radiation.  Plan:  Continue treatment as planned.    Billie Lade, M.D.

## 2011-12-24 ENCOUNTER — Ambulatory Visit
Admission: RE | Admit: 2011-12-24 | Discharge: 2011-12-24 | Disposition: A | Discharge status: Home / Self Care | Payer: Medicare Other | Source: Ambulatory Visit | Attending: Radiation Oncology | Admitting: Radiation Oncology

## 2011-12-24 ENCOUNTER — Other Ambulatory Visit: Payer: Self-pay | Admitting: Internal Medicine

## 2011-12-24 DIAGNOSIS — C9000 Multiple myeloma not having achieved remission: Secondary | ICD-10-CM

## 2011-12-25 ENCOUNTER — Telehealth: Payer: Self-pay | Admitting: Internal Medicine

## 2011-12-25 ENCOUNTER — Ambulatory Visit: Payer: Medicare Other | Admitting: Pharmacist

## 2011-12-25 ENCOUNTER — Ambulatory Visit (HOSPITAL_BASED_OUTPATIENT_CLINIC_OR_DEPARTMENT_OTHER): Payer: Medicare Other

## 2011-12-25 ENCOUNTER — Ambulatory Visit (HOSPITAL_BASED_OUTPATIENT_CLINIC_OR_DEPARTMENT_OTHER): Payer: Medicare Other | Admitting: Physician Assistant

## 2011-12-25 ENCOUNTER — Encounter: Payer: Self-pay | Admitting: Physician Assistant

## 2011-12-25 ENCOUNTER — Ambulatory Visit
Admission: RE | Admit: 2011-12-25 | Discharge: 2011-12-25 | Disposition: A | Discharge status: Home / Self Care | Payer: Medicare Other | Source: Ambulatory Visit | Attending: Radiation Oncology | Admitting: Radiation Oncology

## 2011-12-25 ENCOUNTER — Other Ambulatory Visit (HOSPITAL_BASED_OUTPATIENT_CLINIC_OR_DEPARTMENT_OTHER): Payer: Medicare Other

## 2011-12-25 VITALS — BP 139/76 | HR 77 | Temp 97.8°F | Ht 61.0 in | Wt 124.6 lb

## 2011-12-25 DIAGNOSIS — I82409 Acute embolism and thrombosis of unspecified deep veins of unspecified lower extremity: Secondary | ICD-10-CM

## 2011-12-25 DIAGNOSIS — C9002 Multiple myeloma in relapse: Secondary | ICD-10-CM

## 2011-12-25 DIAGNOSIS — R110 Nausea: Secondary | ICD-10-CM

## 2011-12-25 DIAGNOSIS — Z86718 Personal history of other venous thrombosis and embolism: Secondary | ICD-10-CM

## 2011-12-25 DIAGNOSIS — Z5112 Encounter for antineoplastic immunotherapy: Secondary | ICD-10-CM

## 2011-12-25 DIAGNOSIS — C3490 Malignant neoplasm of unspecified part of unspecified bronchus or lung: Secondary | ICD-10-CM

## 2011-12-25 DIAGNOSIS — C9000 Multiple myeloma not having achieved remission: Secondary | ICD-10-CM

## 2011-12-25 LAB — CBC WITH DIFFERENTIAL/PLATELET
BASO%: 0.2 % (ref 0.0–2.0)
EOS%: 2.1 % (ref 0.0–7.0)
MCH: 28.7 pg (ref 25.1–34.0)
MCV: 88.7 fL (ref 79.5–101.0)
MONO%: 8.5 % (ref 0.0–14.0)
RBC: 4.08 10*6/uL (ref 3.70–5.45)
RDW: 15.8 % — ABNORMAL HIGH (ref 11.2–14.5)
lymph#: 0.4 10*3/uL — ABNORMAL LOW (ref 0.9–3.3)
nRBC: 0 % (ref 0–0)

## 2011-12-25 LAB — COMPREHENSIVE METABOLIC PANEL
ALT: 14 U/L (ref 0–35)
AST: 17 U/L (ref 0–37)
Alkaline Phosphatase: 50 U/L (ref 39–117)
Potassium: 3.7 mEq/L (ref 3.5–5.3)
Sodium: 142 mEq/L (ref 135–145)
Total Bilirubin: 0.3 mg/dL (ref 0.3–1.2)
Total Protein: 5.9 g/dL — ABNORMAL LOW (ref 6.0–8.3)

## 2011-12-25 LAB — PROTIME-INR
INR: 3.4 (ref 2.00–3.50)
Protime: 40.8 Seconds — ABNORMAL HIGH (ref 10.6–13.4)

## 2011-12-25 MED ORDER — PROCHLORPERAZINE MALEATE 10 MG PO TABS: 10.0000 mg | ORAL_TABLET | Freq: Four times a day (QID) | ORAL | Status: DC | PRN

## 2011-12-25 MED ORDER — BORTEZOMIB CHEMO IV INJECTION 3.5 MG
1.3000 mg/m2 | Freq: Once | INTRAMUSCULAR | Status: AC
  Administered 2011-12-25: 2 mg via INTRAVENOUS
  Filled 2011-12-25: qty 2

## 2011-12-25 MED ORDER — ONDANSETRON 8 MG/50ML IVPB (CHCC)
8.0000 mg | Freq: Once | INTRAVENOUS | Status: AC
  Administered 2011-12-25: 8 mg via INTRAVENOUS

## 2011-12-25 MED ORDER — OXYCODONE HCL 5 MG PO TABS
ORAL_TABLET | ORAL | Status: DC
Start: 2011-12-25 — End: 2012-01-23

## 2011-12-25 MED ORDER — SODIUM CHLORIDE 0.9 % IV SOLN: Freq: Once | INTRAVENOUS | Status: DC

## 2011-12-25 NOTE — Progress Notes (Signed)
[facility] [phone]   Fax:(336) [phone]  OFFICE PROGRESS NOTE  [name], MD, MD [address] [place] [address]  DIAGNOSIS:  1. multiple myeloma diagnosed in September 2009,  2. history of bilateral lower extremity deep vein thrombosis diagnosed in November 2009  3. acute on chronic deep vein thrombosis in the left lower extremity diagnosed in October 2010.   PRIOR THERAPY:  1. status post palliative radiotherapy to the right femur and is she'll tuberosity, first was done in December 2011 and the second treatment was completed Jan 08 2011, and the third treatment to the right distal femur completed on 04/30/2011  2. status post palliative radiotherapy to the left proximal femur completed 02/28/2011.  3. Revlimid 25 mg by mouth daily for 21 days every 4 weeks in addition to oral Decadron at 40 mg by mouth on a weekly basis status post 37 cycles.   CURRENT THERAPY:  1. weekly subcutaneous Velcade 1.3 mg/M2 status post 17 cycles.  2. Coumadin 5 mg on Mondays and Thursdays and 2.5 mg on all other days.  3. Zometa 4 mg IV given every 2 months.  INTERVAL HISTORY: [name] 76 y.o. female returns to the clinic today for followup visit accompanied by her daughters. Today she reports feeling well in stating that her pain is under better control. She requests refills for her oxycodone, dexamethasone, and Compazine. She continues to tolerate her weekly subcutaneous Velcade without difficulty. She continues on Zometa given IV every 2 months. Her last protein studies were done in February 2013.  She denied having any significant weight loss or night sweats. She is currently undergoing some palliative radiotherapy to the painful bony sites in her upper and lower extremities.  MEDICAL HISTORY: Past Medical History  Diagnosis Date  . Blood transfusion   . Depression     Mild  . History of chicken pox   . Hyperlipidemia   . Hypertension   .  Degenerative joint disease   . Diabetes mellitus     Steroid related  . Phlebitis   . Multiple myeloma     per Dr. [name], s/p palliative radiation for leg pain 2011 per Dr. Roselind Messier  . Deep vein thrombosis of bilateral lower extremities     ALLERGIES:   has no known allergies.  MEDICATIONS:  Current Outpatient Prescriptions  Medication Sig Dispense Refill  . ALPRAZolam (XANAX) 0.5 MG tablet TAKE ONE TABLET BY MOUTH THREE TIMES DAILY AS NEEDED FOR SLEEP OR  ANXIETY  60 tablet  0  . Calcium Carbonate-Vitamin D 600-400 MG-UNIT per tablet Take 1 tablet by mouth 3 (three) times daily with meals.        Marland Kitchen dexamethasone (DECADRON) 4 MG tablet TAKE 10 TABLETS BY MOUTH ONCE PER WEEK WITH FOOD AS DIRECTED  40 tablet  2  . glipiZIDE (GLUCOTROL XL) 2.5 MG 24 hr tablet TAKE ONE TABLET BY MOUTH EVERY DAY  90 tablet  2  . glucose blood (ONE TOUCH ULTRA TEST) test strip Use as instructed  25 each  6  . ibuprofen (ADVIL,MOTRIN) 200 MG tablet Take 200 mg by mouth every 6 (six) hours as needed.        Marland Kitchen KLOR-CON M20 20 MEQ tablet TAKE ONE TABLET BY MOUTH EVERY DAY  30 each  1  . Lancets (ONETOUCH ULTRASOFT) lancets Test blood sugar once daily or as directed.  100 each  11  . lisinopril-hydrochlorothiazide (PRINZIDE,ZESTORETIC) 20-12.5 MG per tablet  TAKE ONE TABLET BY MOUTH EVERY DAY  90 tablet  2  . magnesium oxide (MAG-OX) 400 MG tablet Take 400 mg by mouth daily.       Marland Kitchen oxyCODONE (OXY IR/ROXICODONE) 5 MG immediate release tablet Take 1 1/2 tabs by mouth every 6 hours as needed for pain  90 tablet  0  . oxyCODONE (OXYCONTIN) 10 MG 12 hr tablet Take 1 tablet (10 mg total) by mouth every 12 (twelve) hours.  60 tablet  0  . prochlorperazine (COMPAZINE) 10 MG tablet Take 1 tablet (10 mg total) by mouth every 6 (six) hours as needed.  30 tablet  1  . temazepam (RESTORIL) 30 MG capsule Take 1 capsule (30 mg total) by mouth at bedtime as needed for sleep.  30 capsule  0  . warfarin (COUMADIN) 5 MG tablet         . DISCONTD: prochlorperazine (COMPAZINE) 10 MG tablet Take 1 tablet (10 mg total) by mouth every 6 (six) hours as needed.  30 tablet  1   No current facility-administered medications for this visit.   Facility-Administered Medications Ordered in Other Visits  Medication Dose Route Frequency Provider Last Rate Last Dose  . 0.9 %  sodium chloride infusion   Intravenous Once Si Gaul, MD      . bortezomib IV (VELCADE) chemo injection 2 mg  1.3 mg/m2 (Treatment Plan Actual) Intravenous Once Si Gaul, MD   2 mg at 12/25/11 1515  . ondansetron (ZOFRAN) IVPB 8 mg  8 mg Intravenous Once Si Gaul, MD   8 mg at 12/25/11 1457  . Zoledronic Acid (ZOMETA) 4 mg IVPB  4 mg Intravenous Once Si Gaul, MD        SURGICAL HISTORY:  Past Surgical History  Procedure Date  . Abdominal hysterectomy     REVIEW OF SYSTEMS:  A comprehensive review of systems was negative except for: Musculoskeletal: positive for bone pain and Now with new sites affecting her upper extremities more so on the right than on the left   PHYSICAL EXAMINATION: General appearance: alert, cooperative and no distress Head: Normocephalic, without obvious abnormality, atraumatic Neck: no adenopathy Resp: clear to auscultation bilaterally Cardio: regular rate and rhythm, S1, S2 normal, no murmur, click, rub or gallop GI: soft, non-tender; bowel sounds normal; no masses,  no organomegaly Extremities: extremities normal, atraumatic, no cyanosis or edema Neurologic: Alert and oriented X 3, normal strength and tone. Normal symmetric reflexes. Normal coordination and gait  ECOG PERFORMANCE STATUS: 1 - Symptomatic but completely ambulatory  Blood pressure 139/76, pulse 77, temperature 97.8 F (36.6 C), temperature source Oral, height 5\' 1"  (1.549 m), weight 124 lb 9.6 oz (56.518 kg).  LABORATORY DATA: Lab Results  Component Value Date   WBC 4.3 12/25/2011   HGB 11.7 12/25/2011   HCT 36.2 12/25/2011   MCV 88.7  12/25/2011   PLT 174 12/25/2011      Chemistry      Component Value Date/Time   NA 141 12/18/2011 1335   K 3.8 12/18/2011 1335   CL 105 12/18/2011 1335   CO2 27 12/18/2011 1335   BUN 13 12/18/2011 1335   CREATININE 0.92 12/18/2011 1335      Component Value Date/Time   CALCIUM 8.4 12/18/2011 1335   CALCIUM  Value: 5.7 CRITICAL RESULT CALLED TO, READ BACK BY AND VERIFIED WITH: JAMIE TRACY,RN [id number] @ 1433 BY J SCOTTON (NOTE)  Amended report. Result repeated and verified. CORRECTED ON 10/14 AT 1429: PREVIOUSLY REPORTED AS Result  repeated and verified.* 06/13/2008 1605   ALKPHOS 47 12/18/2011 1335   AST 16 12/18/2011 1335   ALT 13 12/18/2011 1335   BILITOT 0.3 12/18/2011 1335     Other lab results: Free kappa light chain 1.09, free lambda light chain 4.76, tablet/lambda ratio 0.23. Beta-2 microglobulin 2.09, IgG 672, IgA 152, IgM 150.   RADIOGRAPHIC STUDIES: Dg Humerus Left  12/04/2011  *RADIOLOGY REPORT*  Clinical Data: Multiple myeloma, pain.  LEFT HUMERUS - 2+ VIEW  Comparison: None.  Findings: There are multiple radiolucencies seen throughout the humerus with some endosteal cortex scalloping consistent with myeloma involvement.  There is no pathologic fracture  IMPRESSION: Findings consistent with myeloma involvement left humerus.  Original Report Authenticated By: [name], M.D.   Dg Humerus Right  12/04/2011  *RADIOLOGY REPORT*  Clinical Data: Multiple myeloma with bilateral humerus pain.  RIGHT HUMERUS - 2+ VIEW  Comparison: None.  Findings: Multiple areas of radiolucency are seen within the humeral shaft scalloping the endosteal cortex consistent with myeloma involvement.  There is no pathologic fracture.  IMPRESSION: Findings consistent with myeloma involvement of the right humerus.  Original Report Authenticated By: [name], M.D.   ASSESSMENT/PLAN: This is a very pleasant 76 years old [place] American female with a recurrent multiple myeloma currently on treatment with  subcutaneous Velcade and Decadron. The patient is tolerating her treatment fairly well and she has a response to first-9  weeks of her treatment. Patient was discussed with Dr. [name]. She'll continue with her weekly subcutaneous Velcade with weekly Decadron 40 mg by mouth daily. Prescriptions for her Decadron and Compazine were sent via E. described to her pharmacy of record and she was given a prescription for OxyIR 5 mg tablets one and a half tablets by mouth every 6 hours as needed for pain a total of 90 with no refill. She will have protein studies drawn in 2 weeks consisting of a quantitative immunoglobin beta 2 microglobulin and serum light chains in addition to her CBC differential, C. met and LDH. She'll followup with Dr. [name] in 3 weeks we'll discuss the results of her protein studies. Her PT/INR and Coumadin therapy continue to be monitored by the [facility] Coumad[facility] clinic.   Laural Benes, Breta Demedeiros E, PA-C  All questions were answered. The patient knows to call the clinic with any problems, questions or concerns. We can certainly see the patient much sooner if necessary.

## 2011-12-25 NOTE — Progress Notes (Signed)
INR supratherapeutic (3.4) today.  No changes in meds, still with decreased appetite overall.  Pt reports that she is still forcing herself to eat.  No problems with bleeding or bruising.  Her INR has slowly creeped upward over the last few weeks, and this is likely d/t her overall poor PO intake.  Will decrease dose slightly to 5mg  daily except 2.5mg  on Mondays and Thursdays.  Daughter, [name], feels confident that they will be able to remember the new dose so long as they have her summary sheet describing the dose as a reference.  Will recheck INR in 1 week with next chemo appt to assess response to new dose.  Pt and daughter communicated understanding.

## 2011-12-25 NOTE — Telephone Encounter (Signed)
appts made and printed for pt,aware that tx for 5/16 will follow,email to mw to add   aom

## 2011-12-26 ENCOUNTER — Ambulatory Visit
Admission: RE | Admit: 2011-12-26 | Discharge: 2011-12-26 | Disposition: A | Discharge status: Home / Self Care | Payer: Medicare Other | Source: Ambulatory Visit | Attending: Radiation Oncology | Admitting: Radiation Oncology

## 2011-12-26 ENCOUNTER — Telehealth: Payer: Self-pay | Admitting: *Deleted

## 2011-12-26 NOTE — Telephone Encounter (Signed)
Per staff message from [place], I have schedule the patient for treatment. Anne aware that appts are ion computer.  JMW

## 2011-12-29 ENCOUNTER — Encounter: Payer: Self-pay | Admitting: Radiation Oncology

## 2011-12-29 ENCOUNTER — Ambulatory Visit
Admission: RE | Admit: 2011-12-29 | Discharge: 2011-12-29 | Disposition: A | Discharge status: Home / Self Care | Payer: Medicare Other | Source: Ambulatory Visit | Attending: Radiation Oncology | Admitting: Radiation Oncology

## 2011-12-29 VITALS — Wt 126.3 lb

## 2011-12-29 DIAGNOSIS — C9000 Multiple myeloma not having achieved remission: Secondary | ICD-10-CM

## 2011-12-29 NOTE — Progress Notes (Signed)
  Radiation Oncology         [phone] ________________________________  Name: [name] MRN: [id number]  Date: 12/29/2011  DOB: 1936-08-09  End of Treatment Note  Diagnosis:   Multiple Myeloma     Indication for treatment:  Painful osseous involvement       Radiation treatment dates:   12/16/11 -12/29/11  Site/dose:   Right humerus,  3000 cGy in 10 fractions  Beams/energy:   Ap/pa   6 MV photons  Narrative: The patient tolerated radiation treatment relatively well. On the last day of treatment she had good improvement in her right arm pain.  Plan: The patient has completed radiation treatment. The patient will return to radiation oncology clinic for routine followup in one month. I advised them to call or return sooner if they have any questions or concerns related to their recovery or treatment.  -----------------------------------  Billie Lade, PhD, MD

## 2011-12-29 NOTE — Progress Notes (Signed)
Patient completed tx right humerus 2:19 PM'  10/10 rad txs, voiced no pain, lifting right elbow and arm   Freely, no c/o pain, alert and oriented x3, 1 month f/u appt card given

## 2011-12-30 ENCOUNTER — Ambulatory Visit: Payer: Medicare Other

## 2011-12-31 ENCOUNTER — Ambulatory Visit: Payer: Medicare Other

## 2012-01-01 ENCOUNTER — Other Ambulatory Visit: Payer: Medicare Other | Admitting: Lab

## 2012-01-01 ENCOUNTER — Other Ambulatory Visit (HOSPITAL_BASED_OUTPATIENT_CLINIC_OR_DEPARTMENT_OTHER): Payer: Medicare Other | Admitting: Lab

## 2012-01-01 ENCOUNTER — Ambulatory Visit (HOSPITAL_BASED_OUTPATIENT_CLINIC_OR_DEPARTMENT_OTHER): Payer: Medicare Other

## 2012-01-01 ENCOUNTER — Ambulatory Visit: Payer: Medicare Other

## 2012-01-01 ENCOUNTER — Other Ambulatory Visit: Payer: Self-pay | Admitting: Physician Assistant

## 2012-01-01 ENCOUNTER — Ambulatory Visit: Payer: Self-pay | Admitting: Pharmacist

## 2012-01-01 VITALS — BP 126/70 | HR 82 | Temp 97.5°F

## 2012-01-01 DIAGNOSIS — Z5112 Encounter for antineoplastic immunotherapy: Secondary | ICD-10-CM

## 2012-01-01 DIAGNOSIS — I82409 Acute embolism and thrombosis of unspecified deep veins of unspecified lower extremity: Secondary | ICD-10-CM

## 2012-01-01 DIAGNOSIS — C9002 Multiple myeloma in relapse: Secondary | ICD-10-CM

## 2012-01-01 DIAGNOSIS — C3490 Malignant neoplasm of unspecified part of unspecified bronchus or lung: Secondary | ICD-10-CM

## 2012-01-01 DIAGNOSIS — C9000 Multiple myeloma not having achieved remission: Secondary | ICD-10-CM

## 2012-01-01 LAB — CBC WITH DIFFERENTIAL/PLATELET
Basophils Absolute: 0 10*3/uL (ref 0.0–0.1)
Eosinophils Absolute: 0.1 10*3/uL (ref 0.0–0.5)
HCT: 35.9 % (ref 34.8–46.6)
HGB: 11.6 g/dL (ref 11.6–15.9)
LYMPH%: 9.9 % — ABNORMAL LOW (ref 14.0–49.7)
MONO#: 0.4 10*3/uL (ref 0.1–0.9)
NEUT#: 3.2 10*3/uL (ref 1.5–6.5)
Platelets: 186 10*3/uL (ref 145–400)
RBC: 4.05 10*6/uL (ref 3.70–5.45)
WBC: 4.1 10*3/uL (ref 3.9–10.3)
nRBC: 0 % (ref 0–0)

## 2012-01-01 LAB — COMPREHENSIVE METABOLIC PANEL
ALT: 13 U/L (ref 0–35)
CO2: 30 mEq/L (ref 19–32)
Calcium: 8.7 mg/dL (ref 8.4–10.5)
Chloride: 101 mEq/L (ref 96–112)
Creatinine, Ser: 0.94 mg/dL (ref 0.50–1.10)
Sodium: 145 mEq/L (ref 135–145)
Total Protein: 5.4 g/dL — ABNORMAL LOW (ref 6.0–8.3)

## 2012-01-01 LAB — PROTIME-INR: Protime: 27.6 Seconds — ABNORMAL HIGH (ref 10.6–13.4)

## 2012-01-01 MED ORDER — BORTEZOMIB CHEMO IV INJECTION 3.5 MG
1.3000 mg/m2 | Freq: Once | INTRAMUSCULAR | Status: AC
  Administered 2012-01-01: 2 mg via INTRAVENOUS
  Filled 2012-01-01: qty 2

## 2012-01-01 MED ORDER — SODIUM CHLORIDE 0.9 % IV SOLN
Freq: Once | INTRAVENOUS | Status: AC
  Administered 2012-01-01: 12:00:00 via INTRAVENOUS

## 2012-01-01 MED ORDER — ONDANSETRON 8 MG/50ML IVPB (CHCC)
8.0000 mg | Freq: Once | INTRAVENOUS | Status: AC
  Administered 2012-01-01: 8 mg via INTRAVENOUS

## 2012-01-01 NOTE — Progress Notes (Signed)
Continue Coumadin 5mg  daily except 2.5mg  on Mondays and Thursdays.  Recheck INR in 1 week when you return for chemo on 01/08/12.  The pharmacist will come and visit with you while you are in the infusion area.

## 2012-01-02 ENCOUNTER — Ambulatory Visit: Payer: Medicare Other

## 2012-01-08 ENCOUNTER — Other Ambulatory Visit (HOSPITAL_BASED_OUTPATIENT_CLINIC_OR_DEPARTMENT_OTHER): Payer: Medicare Other | Admitting: Lab

## 2012-01-08 ENCOUNTER — Ambulatory Visit: Payer: Medicare Other | Admitting: Pharmacist

## 2012-01-08 ENCOUNTER — Ambulatory Visit (HOSPITAL_BASED_OUTPATIENT_CLINIC_OR_DEPARTMENT_OTHER): Payer: Medicare Other

## 2012-01-08 VITALS — BP 140/72 | HR 73 | Temp 97.2°F

## 2012-01-08 DIAGNOSIS — Z5112 Encounter for antineoplastic immunotherapy: Secondary | ICD-10-CM

## 2012-01-08 DIAGNOSIS — C9000 Multiple myeloma not having achieved remission: Secondary | ICD-10-CM

## 2012-01-08 DIAGNOSIS — I82409 Acute embolism and thrombosis of unspecified deep veins of unspecified lower extremity: Secondary | ICD-10-CM

## 2012-01-08 DIAGNOSIS — C9002 Multiple myeloma in relapse: Secondary | ICD-10-CM

## 2012-01-08 LAB — CBC WITH DIFFERENTIAL/PLATELET
Basophils Absolute: 0 10*3/uL (ref 0.0–0.1)
EOS%: 1.1 % (ref 0.0–7.0)
Eosinophils Absolute: 0.1 10*3/uL (ref 0.0–0.5)
HGB: 12.2 g/dL (ref 11.6–15.9)
LYMPH%: 10.7 % — ABNORMAL LOW (ref 14.0–49.7)
MCH: 29 pg (ref 25.1–34.0)
MCV: 88.6 fL (ref 79.5–101.0)
MONO%: 10.1 % (ref 0.0–14.0)
Platelets: 196 10*3/uL (ref 145–400)
RBC: 4.2 10*6/uL (ref 3.70–5.45)
RDW: 16.4 % — ABNORMAL HIGH (ref 11.2–14.5)

## 2012-01-08 LAB — PROTIME-INR
INR: 2.2 (ref 2.00–3.50)
Protime: 26.4 Seconds — ABNORMAL HIGH (ref 10.6–13.4)

## 2012-01-08 MED ORDER — ONDANSETRON 8 MG/50ML IVPB (CHCC)
8.0000 mg | Freq: Once | INTRAVENOUS | Status: AC
  Administered 2012-01-08: 8 mg via INTRAVENOUS

## 2012-01-08 MED ORDER — SODIUM CHLORIDE 0.9 % IV SOLN
Freq: Once | INTRAVENOUS | Status: AC
  Administered 2012-01-08: 13:00:00 via INTRAVENOUS

## 2012-01-08 MED ORDER — BORTEZOMIB CHEMO IV INJECTION 3.5 MG
1.3000 mg/m2 | Freq: Once | INTRAMUSCULAR | Status: AC
  Administered 2012-01-08: 2 mg via INTRAVENOUS
  Filled 2012-01-08: qty 2

## 2012-01-08 NOTE — Progress Notes (Signed)
INR therapeutic (2.2) today.  No complaints per pt or daughter.  Will continue current dose of 5mg  daily except 2.5mg  on M and Th.  Recheck INR in 2 weeks.  Will see pt in infusion area.

## 2012-01-12 ENCOUNTER — Other Ambulatory Visit: Payer: Self-pay | Admitting: Internal Medicine

## 2012-01-12 LAB — SPEP & IFE WITH QIG
Alpha-1-Globulin: 5.4 % — ABNORMAL HIGH (ref 2.9–4.9)
Alpha-2-Globulin: 14.1 % — ABNORMAL HIGH (ref 7.1–11.8)
Beta 2: 5.3 % (ref 3.2–6.5)
Beta Globulin: 6.4 % (ref 4.7–7.2)
Gamma Globulin: 10 % — ABNORMAL LOW (ref 11.1–18.8)
IgG (Immunoglobin G), Serum: 589 mg/dL — ABNORMAL LOW (ref 690–1700)

## 2012-01-12 LAB — KAPPA/LAMBDA LIGHT CHAINS
Kappa free light chain: 0.72 mg/dL (ref 0.33–1.94)
Kappa:Lambda Ratio: 0.18 — ABNORMAL LOW (ref 0.26–1.65)
Lambda Free Lght Chn: 3.99 mg/dL — ABNORMAL HIGH (ref 0.57–2.63)

## 2012-01-12 LAB — BETA 2 MICROGLOBULIN, SERUM: Beta-2 Microglobulin: 1.9 mg/L — ABNORMAL HIGH (ref 1.01–1.73)

## 2012-01-12 LAB — LACTATE DEHYDROGENASE: LDH: 257 U/L — ABNORMAL HIGH (ref 94–250)

## 2012-01-12 LAB — COMPREHENSIVE METABOLIC PANEL
Alkaline Phosphatase: 47 U/L (ref 39–117)
CO2: 24 mEq/L (ref 19–32)
Creatinine, Ser: 0.94 mg/dL (ref 0.50–1.10)
Glucose, Bld: 130 mg/dL — ABNORMAL HIGH (ref 70–99)
Sodium: 139 mEq/L (ref 135–145)
Total Bilirubin: 0.3 mg/dL (ref 0.3–1.2)
Total Protein: 5.8 g/dL — ABNORMAL LOW (ref 6.0–8.3)

## 2012-01-14 ENCOUNTER — Other Ambulatory Visit: Payer: Self-pay | Admitting: Internal Medicine

## 2012-01-15 ENCOUNTER — Telehealth: Payer: Self-pay | Admitting: Internal Medicine

## 2012-01-15 ENCOUNTER — Ambulatory Visit (HOSPITAL_BASED_OUTPATIENT_CLINIC_OR_DEPARTMENT_OTHER): Payer: Medicare Other | Admitting: Internal Medicine

## 2012-01-15 ENCOUNTER — Ambulatory Visit (HOSPITAL_BASED_OUTPATIENT_CLINIC_OR_DEPARTMENT_OTHER): Payer: Medicare Other

## 2012-01-15 ENCOUNTER — Ambulatory Visit: Payer: Self-pay | Admitting: Pharmacist

## 2012-01-15 ENCOUNTER — Other Ambulatory Visit (HOSPITAL_BASED_OUTPATIENT_CLINIC_OR_DEPARTMENT_OTHER): Payer: Medicare Other | Admitting: Lab

## 2012-01-15 VITALS — BP 153/76 | HR 71 | Temp 97.4°F | Ht 61.0 in | Wt 125.0 lb

## 2012-01-15 DIAGNOSIS — I82509 Chronic embolism and thrombosis of unspecified deep veins of unspecified lower extremity: Secondary | ICD-10-CM

## 2012-01-15 DIAGNOSIS — C3490 Malignant neoplasm of unspecified part of unspecified bronchus or lung: Secondary | ICD-10-CM

## 2012-01-15 DIAGNOSIS — I82409 Acute embolism and thrombosis of unspecified deep veins of unspecified lower extremity: Secondary | ICD-10-CM

## 2012-01-15 DIAGNOSIS — Z7901 Long term (current) use of anticoagulants: Secondary | ICD-10-CM

## 2012-01-15 DIAGNOSIS — C9000 Multiple myeloma not having achieved remission: Secondary | ICD-10-CM

## 2012-01-15 DIAGNOSIS — G4700 Insomnia, unspecified: Secondary | ICD-10-CM

## 2012-01-15 DIAGNOSIS — Z5112 Encounter for antineoplastic immunotherapy: Secondary | ICD-10-CM

## 2012-01-15 LAB — COMPREHENSIVE METABOLIC PANEL
Albumin: 3.9 g/dL (ref 3.5–5.2)
Alkaline Phosphatase: 51 U/L (ref 39–117)
BUN: 10 mg/dL (ref 6–23)
CO2: 26 mEq/L (ref 19–32)
Calcium: 8.9 mg/dL (ref 8.4–10.5)
Glucose, Bld: 88 mg/dL (ref 70–99)
Potassium: 4.2 mEq/L (ref 3.5–5.3)
Sodium: 136 mEq/L (ref 135–145)
Total Protein: 6.3 g/dL (ref 6.0–8.3)

## 2012-01-15 LAB — CBC WITH DIFFERENTIAL/PLATELET
Basophils Absolute: 0 10*3/uL (ref 0.0–0.1)
EOS%: 1.7 % (ref 0.0–7.0)
Eosinophils Absolute: 0.1 10*3/uL (ref 0.0–0.5)
HCT: 36.7 % (ref 34.8–46.6)
HGB: 11.9 g/dL (ref 11.6–15.9)
MCH: 29 pg (ref 25.1–34.0)
MCV: 89.3 fL (ref 79.5–101.0)
MONO%: 10.9 % (ref 0.0–14.0)
NEUT#: 3.2 10*3/uL (ref 1.5–6.5)
NEUT%: 76.1 % (ref 38.4–76.8)
lymph#: 0.5 10*3/uL — ABNORMAL LOW (ref 0.9–3.3)

## 2012-01-15 MED ORDER — TEMAZEPAM 30 MG PO CAPS: 30.0000 mg | ORAL_CAPSULE | Freq: Every evening | ORAL | Status: DC | PRN

## 2012-01-15 MED ORDER — ONDANSETRON 8 MG/50ML IVPB (CHCC)
8.0000 mg | Freq: Once | INTRAVENOUS | Status: AC
  Administered 2012-01-15: 8 mg via INTRAVENOUS

## 2012-01-15 MED ORDER — SODIUM CHLORIDE 0.9 % IV SOLN
Freq: Once | INTRAVENOUS | Status: AC
  Administered 2012-01-15: 11:00:00 via INTRAVENOUS

## 2012-01-15 MED ORDER — BORTEZOMIB CHEMO IV INJECTION 3.5 MG
1.3000 mg/m2 | Freq: Once | INTRAMUSCULAR | Status: AC
  Administered 2012-01-15: 2 mg via INTRAVENOUS
  Filled 2012-01-15: qty 2

## 2012-01-15 MED ORDER — OXYCODONE HCL 10 MG PO TB12: 10.0000 mg | ORAL_TABLET | Freq: Two times a day (BID) | ORAL | Status: DC

## 2012-01-15 MED ORDER — ALPRAZOLAM 0.5 MG PO TABS: 0.5000 mg | ORAL_TABLET | Freq: Three times a day (TID) | ORAL | Status: DC | PRN

## 2012-01-15 NOTE — Progress Notes (Signed)
[facility] [phone]   Fax:(336) [phone]  OFFICE PROGRESS NOTE  [name], MD, MD [address] [place] [address]  DIAGNOSIS:  1. multiple myeloma diagnosed in September 2009,  2. history of bilateral lower extremity deep vein thrombosis diagnosed in November 2009  3. acute on chronic deep vein thrombosis in the left lower extremity diagnosed in October 2010.   PRIOR THERAPY:  1. status post palliative radiotherapy to the right femur and is she'll tuberosity, first was done in December 2011 and the second treatment was completed Jan 08 2011, and the third treatment to the right distal femur completed on 04/30/2011  2. status post palliative radiotherapy to the left proximal femur completed 02/28/2011.  3. Revlimid 25 mg by mouth daily for 21 days every 4 weeks in addition to oral Decadron at 40 mg by mouth on a weekly basis status post 37 cycles.   CURRENT THERAPY:  1. weekly subcutaneous Velcade 1.3 mg/M2 status post 17 cycles.  2. Coumadin 5 mg on Mondays and Thursdays and 2.5 mg on all other days.  3. Zometa 4 mg IV given every 2 months.   INTERVAL HISTORY: [name] 76 y.o. female returns to the clinic today for followup visit accompanied by her daughter. The patient is doing fine and tolerating her treatment with Velcade and Decadron fairly well. She denied having any significant weight loss or night sweats. He has no chest pain or shortness of breath. He continues to have mild pain in the right leg. The patient has repeat CBC, comprehensive metabolic panel, LDH and myeloma panel performed recently and she is here for evaluation and discussion of her lab results.  MEDICAL HISTORY: Past Medical History  Diagnosis Date  . Blood transfusion   . Depression     Mild  . History of chicken pox   . Hyperlipidemia   . Hypertension   . Degenerative joint disease   . Diabetes mellitus     Steroid related  . Phlebitis   .  Multiple myeloma     per Dr. [name], s/p palliative radiation for leg pain 2011 per Dr. Roselind Messier  . Deep vein thrombosis of bilateral lower extremities     ALLERGIES:   has no known allergies.  MEDICATIONS:  Current Outpatient Prescriptions  Medication Sig Dispense Refill  . ALPRAZolam (XANAX) 0.5 MG tablet Take 1 tablet (0.5 mg total) by mouth 3 (three) times daily as needed for sleep. As needed for sleep or anxiety  30 tablet  0  . Calcium Carbonate-Vitamin D 600-400 MG-UNIT per tablet Take 1 tablet by mouth 3 (three) times daily with meals.        Marland Kitchen dexamethasone (DECADRON) 4 MG tablet TAKE 10 TABLETS BY MOUTH ONCE PER WEEK WITH FOOD AS DIRECTED  40 tablet  2  . glipiZIDE (GLUCOTROL XL) 2.5 MG 24 hr tablet TAKE ONE TABLET BY MOUTH EVERY DAY  90 tablet  2  . glucose blood (ONE TOUCH ULTRA TEST) test strip Use as instructed  25 each  6  . ibuprofen (ADVIL,MOTRIN) 200 MG tablet Take 200 mg by mouth every 6 (six) hours as needed.        Marland Kitchen KLOR-CON M20 20 MEQ tablet TAKE ONE TABLET BY MOUTH EVERY DAY  30 each  1  . Lancets (ONETOUCH ULTRASOFT) lancets Test blood sugar once daily or as directed.  100 each  11  . lisinopril-hydrochlorothiazide (PRINZIDE,ZESTORETIC) 20-12.5 MG per tablet TAKE ONE  TABLET BY MOUTH EVERY DAY  90 tablet  2  . magnesium oxide (MAG-OX) 400 MG tablet Take 400 mg by mouth daily.       Marland Kitchen oxyCODONE (OXY IR/ROXICODONE) 5 MG immediate release tablet Take 1 1/2 tabs by mouth every 6 hours as needed for pain  90 tablet  0  . oxyCODONE (OXYCONTIN) 10 MG 12 hr tablet Take 1 tablet (10 mg total) by mouth every 12 (twelve) hours.  60 tablet  0  . prochlorperazine (COMPAZINE) 10 MG tablet Take 1 tablet (10 mg total) by mouth every 6 (six) hours as needed.  30 tablet  1  . temazepam (RESTORIL) 30 MG capsule Take 1 capsule (30 mg total) by mouth at bedtime as needed for sleep.  30 capsule  0  . warfarin (COUMADIN) 5 MG tablet       . DISCONTD: temazepam (RESTORIL) 30 MG capsule Take 1  capsule (30 mg total) by mouth at bedtime as needed for sleep.  30 capsule  0   No current facility-administered medications for this visit.   Facility-Administered Medications Ordered in Other Visits  Medication Dose Route Frequency Provider Last Rate Last Dose  . Zoledronic Acid (ZOMETA) 4 mg IVPB  4 mg Intravenous Once Si Gaul, MD        SURGICAL HISTORY:  Past Surgical History  Procedure Date  . Abdominal hysterectomy     REVIEW OF SYSTEMS:  A comprehensive review of systems was negative.   PHYSICAL EXAMINATION: General appearance: alert, cooperative and no distress Neck: no adenopathy Lymph nodes: Cervical, supraclavicular, and axillary nodes normal. Resp: clear to auscultation bilaterally Cardio: regular rate and rhythm, S1, S2 normal, no murmur, click, rub or gallop GI: soft, non-tender; bowel sounds normal; no masses,  no organomegaly Extremities: extremities normal, atraumatic, no cyanosis or edema  ECOG PERFORMANCE STATUS: 1 - Symptomatic but completely ambulatory  Blood pressure 153/76, pulse 71, temperature 97.4 F (36.3 C), temperature source Oral, height 5\' 1"  (1.549 m), weight 125 lb (56.7 kg).  LABORATORY DATA: Lab Results  Component Value Date   WBC 4.1 01/15/2012   HGB 11.9 01/15/2012   HCT 36.7 01/15/2012   MCV 89.3 01/15/2012   PLT 170 01/15/2012      Chemistry      Component Value Date/Time   NA 139 01/08/2012 1235   K 3.9 01/08/2012 1235   CL 105 01/08/2012 1235   CO2 24 01/08/2012 1235   BUN 14 01/08/2012 1235   CREATININE 0.94 01/08/2012 1235      Component Value Date/Time   CALCIUM 9.0 01/08/2012 1235   CALCIUM  Value: 5.7 CRITICAL RESULT CALLED TO, READ BACK BY AND VERIFIED WITH: JAMIE TRACY,RN [id number] @ 1433 BY J SCOTTON (NOTE)  Amended report. Result repeated and verified. CORRECTED ON 10/14 AT 1429: PREVIOUSLY REPORTED AS Result repeated and verified.* 06/13/2008 1605   ALKPHOS 47 01/08/2012 1235   AST 18 01/08/2012 1235   ALT 15 01/08/2012 1235    BILITOT 0.3 01/08/2012 1235       RADIOGRAPHIC STUDIES: Mm Digital Screening  12/23/2011  DG SCREEN MAMMOGRAM BILATERAL Bilateral CC and MLO view(s) were taken.  DIGITAL SCREENING MAMMOGRAM WITH CAD: There are scattered fibroglandular densities.  No masses or malignant type calcifications are  identified.  Compared with prior studies.  Images were processed with CAD.  IMPRESSION: No specific mammographic evidence of malignancy.  Next screening mammogram is recommended in one  year.  A result letter of this screening mammogram will  be mailed directly to the patient.  ASSESSMENT: Negative - BI-RADS 1  Screening mammogram in 1 year. ,    ASSESSMENT: This is a very pleasant 76 years old [place] American female with multiple myeloma currently on treatment with subcutaneous Velcade and weekly Decadron. The patient is tolerating her treatment fairly well with no significant complaints and she has no evidence for disease progression.  PLAN: I discussed the lab result with the patient and her daughter. I recommended for her to continue on the same treatment regimen for now. She would also receiver Zometa today and every 8 weeks. The patient was given a refill of her pain medication. She would come back for followup visit in 2 weeks for evaluation. She was advised to call immediately if she has any concerning symptoms in the interval.  All questions were answered. The patient knows to call the clinic with any problems, questions or concerns. We can certainly see the patient much sooner if necessary.

## 2012-01-15 NOTE — Patient Instructions (Signed)
[facility] Discharge Instructions for Patients Receiving Chemotherapy  Today you received the following chemotherapy agents VELCADE To help prevent nausea and vomiting after your treatment, we encourage you to take your nausea medications as directed by your provider  If you develop nausea and vomiting that is not controlled by your nausea medication, call the clinic. If it is after clinic hours your family physician or the after hours number for the clinic or go to the Emergency Department.   BELOW ARE SYMPTOMS THAT SHOULD BE REPORTED IMMEDIATELY:  *FEVER GREATER THAN 100.5 F  *CHILLS WITH OR WITHOUT FEVER  NAUSEA AND VOMITING THAT IS NOT CONTROLLED WITH YOUR NAUSEA MEDICATION  *UNUSUAL SHORTNESS OF BREATH  *UNUSUAL BRUISING OR BLEEDING  TENDERNESS IN MOUTH AND THROAT WITH OR WITHOUT PRESENCE OF ULCERS  *URINARY PROBLEMS  *BOWEL PROBLEMS  UNUSUAL RASH Items with * indicate a potential emergency and should be followed up as soon as possible.  One of the nurses will contact you 24 hours after your treatment. Please let the nurse know about any problems that you may have experienced. Feel free to call the clinic you have any questions or concerns. The clinic phone number is [phone].   I have been informed and understand all the instructions given to me. I know to contact the clinic, my physician, or go to the Emergency Department if any problems should occur. I do not have any questions at this time, but understand that I may call the clinic during office hours   should I have any questions or need assistance in obtaining follow up care.    __________________________________________  _____________  __________ Signature of Patient or Authorized Representative            Date                   Time    __________________________________________ Nurse's Signature

## 2012-01-15 NOTE — Telephone Encounter (Signed)
appts made and printed for pt aom °

## 2012-01-15 NOTE — Progress Notes (Signed)
INR = 2.7 on 5 mg/day; 2.5 mg Mon & Thurs. Saw pt w/ Dr. [name] today during office visit.  Pt not sched for Coumadin clinic but INR drawn today. Pt doing fine w/o complaints re: anticoag. Will cont same dose of Coumadin.  Cancel appt for Coumadin clinic next week & we can see her when she is here 02/02/12 for appt w/ Dr. Roselind Messier.  We will see her after she has seen him. Marily Lente, Pharm.D.

## 2012-01-22 ENCOUNTER — Other Ambulatory Visit (HOSPITAL_BASED_OUTPATIENT_CLINIC_OR_DEPARTMENT_OTHER): Payer: Medicare Other | Admitting: Lab

## 2012-01-22 ENCOUNTER — Ambulatory Visit (HOSPITAL_BASED_OUTPATIENT_CLINIC_OR_DEPARTMENT_OTHER): Payer: Medicare Other

## 2012-01-22 ENCOUNTER — Ambulatory Visit: Payer: Medicare Other

## 2012-01-22 VITALS — BP 129/76 | HR 77 | Temp 97.8°F

## 2012-01-22 DIAGNOSIS — Z5112 Encounter for antineoplastic immunotherapy: Secondary | ICD-10-CM

## 2012-01-22 DIAGNOSIS — I82409 Acute embolism and thrombosis of unspecified deep veins of unspecified lower extremity: Secondary | ICD-10-CM

## 2012-01-22 DIAGNOSIS — C9000 Multiple myeloma not having achieved remission: Secondary | ICD-10-CM

## 2012-01-22 LAB — COMPREHENSIVE METABOLIC PANEL
ALT: 11 U/L (ref 0–35)
AST: 17 U/L (ref 0–37)
Albumin: 3.9 g/dL (ref 3.5–5.2)
Alkaline Phosphatase: 53 U/L (ref 39–117)
BUN: 10 mg/dL (ref 6–23)
Calcium: 9.2 mg/dL (ref 8.4–10.5)
Chloride: 103 mEq/L (ref 96–112)
Potassium: 3.9 mEq/L (ref 3.5–5.3)

## 2012-01-22 LAB — CBC WITH DIFFERENTIAL/PLATELET
BASO%: 0 % (ref 0.0–2.0)
Basophils Absolute: 0 10*3/uL (ref 0.0–0.1)
EOS%: 1.9 % (ref 0.0–7.0)
HCT: 38.5 % (ref 34.8–46.6)
HGB: 12.5 g/dL (ref 11.6–15.9)
MCH: 29 pg (ref 25.1–34.0)
MCHC: 32.5 g/dL (ref 31.5–36.0)
MCV: 89.3 fL (ref 79.5–101.0)
MONO%: 12.1 % (ref 0.0–14.0)
NEUT%: 76.1 % (ref 38.4–76.8)
RDW: 15.8 % — ABNORMAL HIGH (ref 11.2–14.5)

## 2012-01-22 MED ORDER — ONDANSETRON 8 MG/50ML IVPB (CHCC)
8.0000 mg | Freq: Once | INTRAVENOUS | Status: AC
  Administered 2012-01-22: 8 mg via INTRAVENOUS

## 2012-01-22 MED ORDER — BORTEZOMIB CHEMO IV INJECTION 3.5 MG
1.3000 mg/m2 | Freq: Once | INTRAMUSCULAR | Status: AC
  Administered 2012-01-22: 2 mg via INTRAVENOUS
  Filled 2012-01-22: qty 2

## 2012-01-22 MED ORDER — ZOLEDRONIC ACID 4 MG/100ML IV SOLN
4.0000 mg | Freq: Once | INTRAVENOUS | Status: AC
  Administered 2012-01-22: 4 mg via INTRAVENOUS
  Filled 2012-01-22: qty 100

## 2012-01-22 MED ORDER — SODIUM CHLORIDE 0.9 % IV SOLN: Freq: Once | INTRAVENOUS | Status: DC

## 2012-01-22 NOTE — Patient Instructions (Signed)
[facility] Discharge Instructions for Patients Receiving Chemotherapy  Today you received the following chemotherapy agents taxotere/adriamycin/cytoxan  To help prevent nausea and vomiting after your treatment, we encourage you to take your nausea medication  and take it as often as prescribed If you develop nausea and vomiting that is not controlled by your nausea medication, call the clinic. If it is after clinic hours your family physician or the after hours number for the clinic or go to the Emergency Department.   BELOW ARE SYMPTOMS THAT SHOULD BE REPORTED IMMEDIATELY:  *FEVER GREATER THAN 100.5 F  *CHILLS WITH OR WITHOUT FEVER  NAUSEA AND VOMITING THAT IS NOT CONTROLLED WITH YOUR NAUSEA MEDICATION  *UNUSUAL SHORTNESS OF BREATH  *UNUSUAL BRUISING OR BLEEDING  TENDERNESS IN MOUTH AND THROAT WITH OR WITHOUT PRESENCE OF ULCERS  *URINARY PROBLEMS  *BOWEL PROBLEMS  UNUSUAL RASH Items with * indicate a potential emergency and should be followed up as soon as possible.  One of the nurses will contact you 24 hours after your treatment. Please let the nurse know about any problems that you may have experienced. Feel free to call the clinic you have any questions or concerns. The clinic phone number is [phone].   I have been informed and understand all the instructions given to me. I know to contact the clinic, my physician, or go to the Emergency Department if any problems should occur. I do not have any questions at this time, but understand that I may call the clinic during office hours   should I have any questions or need assistance in obtaining follow up care.    __________________________________________  _____________  __________ Signature of Patient or Authorized Representative            Date                   Time    __________________________________________ Nurse's Signature

## 2012-01-23 ENCOUNTER — Other Ambulatory Visit: Payer: Self-pay | Admitting: *Deleted

## 2012-01-23 DIAGNOSIS — C9000 Multiple myeloma not having achieved remission: Secondary | ICD-10-CM

## 2012-01-23 MED ORDER — OXYCODONE HCL 5 MG PO TABS: ORAL_TABLET | ORAL | Status: DC

## 2012-01-27 ENCOUNTER — Other Ambulatory Visit: Payer: Self-pay | Admitting: Internal Medicine

## 2012-01-28 ENCOUNTER — Other Ambulatory Visit: Payer: Self-pay | Admitting: *Deleted

## 2012-01-28 ENCOUNTER — Other Ambulatory Visit: Payer: Self-pay | Admitting: Internal Medicine

## 2012-01-28 DIAGNOSIS — C9000 Multiple myeloma not having achieved remission: Secondary | ICD-10-CM

## 2012-01-28 MED ORDER — ALPRAZOLAM 0.5 MG PO TABS: 0.5000 mg | ORAL_TABLET | Freq: Three times a day (TID) | ORAL | Status: DC | PRN

## 2012-01-29 ENCOUNTER — Telehealth: Payer: Self-pay | Admitting: Internal Medicine

## 2012-01-29 ENCOUNTER — Encounter: Payer: Self-pay | Admitting: Physician Assistant

## 2012-01-29 ENCOUNTER — Ambulatory Visit (HOSPITAL_BASED_OUTPATIENT_CLINIC_OR_DEPARTMENT_OTHER): Payer: Medicare Other

## 2012-01-29 ENCOUNTER — Other Ambulatory Visit (HOSPITAL_BASED_OUTPATIENT_CLINIC_OR_DEPARTMENT_OTHER): Payer: Medicare Other | Admitting: Lab

## 2012-01-29 ENCOUNTER — Ambulatory Visit (HOSPITAL_BASED_OUTPATIENT_CLINIC_OR_DEPARTMENT_OTHER): Payer: Medicare Other | Admitting: Physician Assistant

## 2012-01-29 VITALS — BP 145/73 | HR 74 | Temp 98.2°F | Ht 61.0 in | Wt 123.8 lb

## 2012-01-29 DIAGNOSIS — C9000 Multiple myeloma not having achieved remission: Secondary | ICD-10-CM

## 2012-01-29 DIAGNOSIS — I82409 Acute embolism and thrombosis of unspecified deep veins of unspecified lower extremity: Secondary | ICD-10-CM

## 2012-01-29 DIAGNOSIS — Z5112 Encounter for antineoplastic immunotherapy: Secondary | ICD-10-CM

## 2012-01-29 DIAGNOSIS — C3490 Malignant neoplasm of unspecified part of unspecified bronchus or lung: Secondary | ICD-10-CM

## 2012-01-29 LAB — CBC WITH DIFFERENTIAL/PLATELET
Basophils Absolute: 0 10*3/uL (ref 0.0–0.1)
Eosinophils Absolute: 0.1 10*3/uL (ref 0.0–0.5)
HGB: 12 g/dL (ref 11.6–15.9)
MCV: 89.6 fL (ref 79.5–101.0)
MONO%: 14.5 % — ABNORMAL HIGH (ref 0.0–14.0)
NEUT#: 2.9 10*3/uL (ref 1.5–6.5)
RDW: 15.7 % — ABNORMAL HIGH (ref 11.2–14.5)
lymph#: 0.4 10*3/uL — ABNORMAL LOW (ref 0.9–3.3)
nRBC: 0 % (ref 0–0)

## 2012-01-29 LAB — PROTIME-INR: INR: 3.4 (ref 2.00–3.50)

## 2012-01-29 LAB — COMPREHENSIVE METABOLIC PANEL
ALT: 11 U/L (ref 0–35)
AST: 16 U/L (ref 0–37)
Alkaline Phosphatase: 45 U/L (ref 39–117)
BUN: 13 mg/dL (ref 6–23)
Creatinine, Ser: 0.96 mg/dL (ref 0.50–1.10)
Total Bilirubin: 0.3 mg/dL (ref 0.3–1.2)

## 2012-01-29 MED ORDER — BORTEZOMIB CHEMO IV INJECTION 3.5 MG
1.3000 mg/m2 | Freq: Once | INTRAMUSCULAR | Status: AC
  Administered 2012-01-29: 2 mg via INTRAVENOUS
  Filled 2012-01-29: qty 2

## 2012-01-29 MED ORDER — ONDANSETRON 8 MG/50ML IVPB (CHCC)
8.0000 mg | Freq: Once | INTRAVENOUS | Status: AC
  Administered 2012-01-29: 8 mg via INTRAVENOUS

## 2012-01-29 MED ORDER — SODIUM CHLORIDE 0.9 % IV SOLN
Freq: Once | INTRAVENOUS | Status: AC
  Administered 2012-01-29: 15:00:00 via INTRAVENOUS

## 2012-01-29 NOTE — Telephone Encounter (Signed)
appts ade and printed

## 2012-01-29 NOTE — Patient Instructions (Signed)
[facility] Discharge Instructions for Patients Receiving Chemotherapy  Today you received the following chemotherapy agents Velcade  To help prevent nausea and vomiting after your treatment, we encourage you to take your nausea medication. Begin taking it at 7pm and take it as often as prescribed for the next 24-48 hours.   If you develop nausea and vomiting that is not controlled by your nausea medication, call the clinic. If it is after clinic hours your family physician or the after hours number for the clinic or go to the Emergency Department.   BELOW ARE SYMPTOMS THAT SHOULD BE REPORTED IMMEDIATELY:  *FEVER GREATER THAN 100.5 F  *CHILLS WITH OR WITHOUT FEVER  NAUSEA AND VOMITING THAT IS NOT CONTROLLED WITH YOUR NAUSEA MEDICATION  *UNUSUAL SHORTNESS OF BREATH  *UNUSUAL BRUISING OR BLEEDING  TENDERNESS IN MOUTH AND THROAT WITH OR WITHOUT PRESENCE OF ULCERS  *URINARY PROBLEMS  *BOWEL PROBLEMS  UNUSUAL RASH Items with * indicate a potential emergency and should be followed up as soon as possible.  One of the nurses will contact you 24 hours after your treatment. Please let the nurse know about any problems that you may have experienced. Feel free to call the clinic you have any questions or concerns. The clinic phone number is [phone].   I have been informed and understand all the instructions given to me. I know to contact the clinic, my physician, or go to the Emergency Department if any problems should occur. I do not have any questions at this time, but understand that I may call the clinic during office hours   should I have any questions or need assistance in obtaining follow up care.    __________________________________________  _____________  __________ Signature of Patient or Authorized Representative            Date                   Time    __________________________________________ Nurse's Signature

## 2012-01-30 ENCOUNTER — Telehealth: Payer: Self-pay | Admitting: Pharmacist

## 2012-02-02 ENCOUNTER — Encounter: Payer: Self-pay | Admitting: Radiation Oncology

## 2012-02-02 ENCOUNTER — Ambulatory Visit: Payer: Medicare Other | Admitting: Pharmacist

## 2012-02-02 ENCOUNTER — Ambulatory Visit
Admission: RE | Admit: 2012-02-02 | Discharge: 2012-02-02 | Disposition: A | Discharge status: Home / Self Care | Payer: Medicare Other | Source: Ambulatory Visit | Attending: Radiation Oncology | Admitting: Radiation Oncology

## 2012-02-02 ENCOUNTER — Other Ambulatory Visit (HOSPITAL_BASED_OUTPATIENT_CLINIC_OR_DEPARTMENT_OTHER): Payer: Medicare Other | Admitting: Lab

## 2012-02-02 VITALS — BP 130/83 | HR 63 | Temp 97.1°F | Resp 18 | Wt 125.1 lb

## 2012-02-02 DIAGNOSIS — I82409 Acute embolism and thrombosis of unspecified deep veins of unspecified lower extremity: Secondary | ICD-10-CM

## 2012-02-02 DIAGNOSIS — C9000 Multiple myeloma not having achieved remission: Secondary | ICD-10-CM

## 2012-02-02 LAB — PROTIME-INR

## 2012-02-02 NOTE — Progress Notes (Signed)
Radiation Oncology         [phone] ________________________________  Name: [name] MRN: [id number]  Date: 02/02/2012  DOB: 05/13/1936  Follow-Up Visit Note  CC: [name], MD, MD  Si Gaul, MD  Diagnosis:   Multiple myeloma  Interval Since Last Radiation:  1 months  Narrative:  The patient returns today for routine follow-up.  She is doing well clinically at this time. She has had good improvement in her pain in the right arm as well as improve mobility. She continues to have some mild discomfort in the upper leg regions. Patient denies any focal motor weakness.                              ALLERGIES:   has no known allergies.  Meds: Current Outpatient Prescriptions  Medication Sig Dispense Refill  . ALPRAZolam (XANAX) 0.5 MG tablet Take 1 tablet (0.5 mg total) by mouth 3 (three) times daily as needed for sleep or anxiety.  60 tablet  0  . Calcium Carbonate-Vitamin D 600-400 MG-UNIT per tablet Take 1 tablet by mouth 3 (three) times daily with meals.        Marland Kitchen dexamethasone (DECADRON) 4 MG tablet TAKE 10 TABLETS BY MOUTH ONCE PER WEEK WITH FOOD AS DIRECTED  40 tablet  2  . glipiZIDE (GLUCOTROL XL) 2.5 MG 24 hr tablet TAKE ONE TABLET BY MOUTH EVERY DAY  90 tablet  2  . glucose blood (ONE TOUCH ULTRA TEST) test strip Use as instructed  25 each  6  . ibuprofen (ADVIL,MOTRIN) 200 MG tablet Take 200 mg by mouth every 6 (six) hours as needed.        Marland Kitchen KLOR-CON M20 20 MEQ tablet TAKE ONE TABLET BY MOUTH EVERY DAY  30 each  1  . Lancets (ONETOUCH ULTRASOFT) lancets Test blood sugar once daily or as directed.  100 each  11  . lisinopril-hydrochlorothiazide (PRINZIDE,ZESTORETIC) 20-12.5 MG per tablet TAKE ONE TABLET BY MOUTH EVERY DAY  90 tablet  2  . magnesium oxide (MAG-OX) 400 MG tablet Take 400 mg by mouth daily.       Marland Kitchen oxyCODONE (OXY IR/ROXICODONE) 5 MG immediate release tablet Take 1 1/2 tabs by mouth every 6 hours as needed for pain  90 tablet  0  . oxyCODONE  (OXYCONTIN) 10 MG 12 hr tablet Take 1 tablet (10 mg total) by mouth every 12 (twelve) hours.  60 tablet  0  . prochlorperazine (COMPAZINE) 10 MG tablet Take 1 tablet (10 mg total) by mouth every 6 (six) hours as needed.  30 tablet  1  . temazepam (RESTORIL) 30 MG capsule Take 1 capsule (30 mg total) by mouth at bedtime as needed for sleep.  30 capsule  0  . warfarin (COUMADIN) 5 MG tablet        No current facility-administered medications for this encounter.   Facility-Administered Medications Ordered in Other Encounters  Medication Dose Route Frequency Provider Last Rate Last Dose  . Zoledronic Acid (ZOMETA) 4 mg IVPB  4 mg Intravenous Once Si Gaul, MD        Physical Findings: The patient is in no acute distress. Patient is alert and oriented.  weight is 125 lb 1.6 oz (56.745 kg). Her oral temperature is 97.1 F (36.2 C). Her blood pressure is 130/83 and her pulse is 63. Her respiration is 18. .  The lungs are clear to auscultation. The heart has regular  rhythm and rate. Abdomen is soft and nontender with normal bowel sounds. Palpation along the right arm area shows no point tenderness. Patient has good range of motion in the right shoulder  Lab Findings: Lab Results  Component Value Date   WBC 3.9 01/29/2012   HGB 12.0 01/29/2012   HCT 37.1 01/29/2012   MCV 89.6 01/29/2012   PLT 194 01/29/2012    @LASTCHEM @  Radiographic Findings: No results found.  Impression:  The patient is recovering from the effects of radiation.  She's had good improvement in her pain in her right arm  Plan:  When necessary followup. She will continue close followup in medical oncology.  _____________________________________    Billie Lade, PhD, MD

## 2012-02-02 NOTE — Progress Notes (Signed)
HERE TODAY FOR FU OF RIGHT SHOULDER TX DUE TO MULTIPLE MYELOMA.  C/O PAIN IN RIGHT LEG TODAY, RATES 5/10 TODAY.  GETS SOME RELIEF FROM PAIN MEDS.  APPETITE DECREASED.  SKIN LOOKS GOOD FROM PREVIOUS TX.  SHE SAYS HER ONLY C/O IS HER LEG PAIN

## 2012-02-02 NOTE — Progress Notes (Signed)
INR now 1.6 after holding 1 dose due to INR = 3.4 last week. Pt asymptomatic. Continue back on maintenance dose. Repeat protime in 10 days w/ next tx.  We will see pt in infusion room 6/13. Marily Lente, Pharm.D.

## 2012-02-03 ENCOUNTER — Other Ambulatory Visit: Payer: Self-pay | Admitting: Pharmacist

## 2012-02-03 DIAGNOSIS — I82409 Acute embolism and thrombosis of unspecified deep veins of unspecified lower extremity: Secondary | ICD-10-CM

## 2012-02-04 NOTE — Progress Notes (Signed)
[facility] [phone]   [phone]  OFFICE PROGRESS NOTE  [name], MD, MD [address] [place] [address]  DIAGNOSIS:  1. multiple myeloma diagnosed in September 2009,  2. history of bilateral lower extremity deep vein thrombosis diagnosed in November 2009  3. acute on chronic deep vein thrombosis in the left lower extremity diagnosed in October 2010.   PRIOR THERAPY:  1. status post palliative radiotherapy to the right femur and is she'll tuberosity, first was done in December 2011 and the second treatment was completed Jan 08 2011, and the third treatment to the right distal femur completed on 04/30/2011  2. status post palliative radiotherapy to the left proximal femur completed 02/28/2011.  3. Revlimid 25 mg by mouth daily for 21 days every 4 weeks in addition to oral Decadron at 40 mg by mouth on a weekly basis status post 37 cycles.   CURRENT THERAPY:  1. weekly subcutaneous Velcade 1.3 mg/M2 status post 19 cycles.  2. Coumadin 5 mg on Mondays and Thursdays and 2.5 mg on all other days.  3. Zometa 4 mg IV given every 2 months.   INTERVAL HISTORY: Kasi Lasky 76 y.o. female returns to the clinic today for followup visit accompanied by her daughter. The patient is doing fine and tolerating her treatment with Velcade and Decadron fairly well. She complains of poor appetite but reports she is drinking fluids fairly well. She has occasional constipation. She no longer has pain in her arms but continues to have pain in her legs, the right more so than the left. She denied having night sweats, chest pain or shortness of breath.   MEDICAL HISTORY: Past Medical History  Diagnosis Date  . Blood transfusion   . Depression     Mild  . History of chicken pox   . Hyperlipidemia   . Hypertension   . Degenerative joint disease   . Diabetes mellitus     Steroid related  . Phlebitis   . Multiple myeloma     per Dr. [name], s/p  palliative radiation for leg pain 2011 per Dr. Roselind Messier  . Deep vein thrombosis of bilateral lower extremities     ALLERGIES:   has no known allergies.  MEDICATIONS:  Current Outpatient Prescriptions  Medication Sig Dispense Refill  . ALPRAZolam (XANAX) 0.5 MG tablet Take 1 tablet (0.5 mg total) by mouth 3 (three) times daily as needed for sleep or anxiety.  60 tablet  0  . Calcium Carbonate-Vitamin D 600-400 MG-UNIT per tablet Take 1 tablet by mouth 3 (three) times daily with meals.        Marland Kitchen dexamethasone (DECADRON) 4 MG tablet TAKE 10 TABLETS BY MOUTH ONCE PER WEEK WITH FOOD AS DIRECTED  40 tablet  2  . glipiZIDE (GLUCOTROL XL) 2.5 MG 24 hr tablet TAKE ONE TABLET BY MOUTH EVERY DAY  90 tablet  2  . glucose blood (ONE TOUCH ULTRA TEST) test strip Use as instructed  25 each  6  . ibuprofen (ADVIL,MOTRIN) 200 MG tablet Take 200 mg by mouth every 6 (six) hours as needed.        Marland Kitchen KLOR-CON M20 20 MEQ tablet TAKE ONE TABLET BY MOUTH EVERY DAY  30 each  1  . Lancets (ONETOUCH ULTRASOFT) lancets Test blood sugar once daily or as directed.  100 each  11  . lisinopril-hydrochlorothiazide (PRINZIDE,ZESTORETIC) 20-12.5 MG per tablet TAKE ONE TABLET BY MOUTH EVERY DAY  90  tablet  2  . magnesium oxide (MAG-OX) 400 MG tablet Take 400 mg by mouth daily.       Marland Kitchen oxyCODONE (OXY IR/ROXICODONE) 5 MG immediate release tablet Take 1 1/2 tabs by mouth every 6 hours as needed for pain  90 tablet  0  . oxyCODONE (OXYCONTIN) 10 MG 12 hr tablet Take 1 tablet (10 mg total) by mouth every 12 (twelve) hours.  60 tablet  0  . prochlorperazine (COMPAZINE) 10 MG tablet Take 1 tablet (10 mg total) by mouth every 6 (six) hours as needed.  30 tablet  1  . temazepam (RESTORIL) 30 MG capsule Take 1 capsule (30 mg total) by mouth at bedtime as needed for sleep.  30 capsule  0  . warfarin (COUMADIN) 5 MG tablet        No current facility-administered medications for this visit.   Facility-Administered Medications Ordered in  Other Visits  Medication Dose Route Frequency Provider Last Rate Last Dose  . Zoledronic Acid (ZOMETA) 4 mg IVPB  4 mg Intravenous Once Si Gaul, MD        SURGICAL HISTORY:  Past Surgical History  Procedure Date  . Abdominal hysterectomy     REVIEW OF SYSTEMS:  A comprehensive review of systems was negative.   PHYSICAL EXAMINATION: General appearance: alert, cooperative and no distress Neck: no adenopathy Lymph nodes: Cervical, supraclavicular, and axillary nodes normal. Resp: clear to auscultation bilaterally Cardio: regular rate and rhythm, S1, S2 normal, no murmur, click, rub or gallop GI: soft, non-tender; bowel sounds normal; no masses,  no organomegaly Extremities: extremities normal, atraumatic, no cyanosis or edema  ECOG PERFORMANCE STATUS: 1 - Symptomatic but completely ambulatory  Blood pressure 145/73, pulse 74, temperature 98.2 F (36.8 C), temperature source Oral, height 5\' 1"  (1.549 m), weight 123 lb 12.8 oz (56.155 kg).  LABORATORY DATA: Lab Results  Component Value Date   WBC 3.9 01/29/2012   HGB 12.0 01/29/2012   HCT 37.1 01/29/2012   MCV 89.6 01/29/2012   PLT 194 01/29/2012      Chemistry      Component Value Date/Time   NA 143 01/29/2012 1329   K 4.0 01/29/2012 1329   CL 104 01/29/2012 1329   CO2 31 01/29/2012 1329   BUN 13 01/29/2012 1329   CREATININE 0.96 01/29/2012 1329      Component Value Date/Time   CALCIUM 9.3 01/29/2012 1329   CALCIUM  Value: 5.7 CRITICAL RESULT CALLED TO, READ BACK BY AND VERIFIED WITH: JAMIE TRACY,RN [id number] @ 1433 BY J SCOTTON (NOTE)  Amended report. Result repeated and verified. CORRECTED ON 10/14 AT 1429: PREVIOUSLY REPORTED AS Result repeated and verified.* 06/13/2008 1605   ALKPHOS 45 01/29/2012 1329   AST 16 01/29/2012 1329   ALT 11 01/29/2012 1329   BILITOT 0.3 01/29/2012 1329       RADIOGRAPHIC STUDIES: Mm Digital Screening  12/23/2011  DG SCREEN MAMMOGRAM BILATERAL Bilateral CC and MLO view(s) were taken.   DIGITAL SCREENING MAMMOGRAM WITH CAD: There are scattered fibroglandular densities.  No masses or malignant type calcifications are  identified.  Compared with prior studies.  Images were processed with CAD.  IMPRESSION: No specific mammographic evidence of malignancy.  Next screening mammogram is recommended in one  year.  A result letter of this screening mammogram will be mailed directly to the patient.  ASSESSMENT: Negative - BI-RADS 1  Screening mammogram in 1 year. ,    ASSESSMENT/PLAN: This is a very pleasant 76 years old African  American female with multiple myeloma currently on treatment with subcutaneous Velcade and weekly Decadron. The patient is tolerating her treatment fairly well with no significant complaints and she has no evidence for disease progression.The patient was discussed with Dr. [name]. She will continue on her current treatment. She will next be due for her Zometa infusion in July 2013. Her coumadin therapy continues to be managed by the [facility]. She will return in 3 weeks with with a repeat CBC Differential, CMET and LDH.  Naliyah Neth E, PA-C   She was advised to call immediately if she has any concerning symptoms in the interval.  All questions were answered. The patient knows to call the clinic with any problems, questions or concerns. We can certainly see the patient much sooner if necessary.

## 2012-02-05 ENCOUNTER — Ambulatory Visit (HOSPITAL_BASED_OUTPATIENT_CLINIC_OR_DEPARTMENT_OTHER): Payer: Medicare Other

## 2012-02-05 ENCOUNTER — Other Ambulatory Visit (HOSPITAL_BASED_OUTPATIENT_CLINIC_OR_DEPARTMENT_OTHER): Payer: Medicare Other | Admitting: Lab

## 2012-02-05 ENCOUNTER — Ambulatory Visit: Payer: Self-pay | Admitting: Pharmacist

## 2012-02-05 VITALS — BP 139/66 | HR 80 | Temp 98.4°F

## 2012-02-05 DIAGNOSIS — I82409 Acute embolism and thrombosis of unspecified deep veins of unspecified lower extremity: Secondary | ICD-10-CM

## 2012-02-05 DIAGNOSIS — C9000 Multiple myeloma not having achieved remission: Secondary | ICD-10-CM

## 2012-02-05 DIAGNOSIS — Z5112 Encounter for antineoplastic immunotherapy: Secondary | ICD-10-CM

## 2012-02-05 LAB — CBC WITH DIFFERENTIAL/PLATELET
BASO%: 0.5 % (ref 0.0–2.0)
Basophils Absolute: 0 10*3/uL (ref 0.0–0.1)
EOS%: 1.6 % (ref 0.0–7.0)
HGB: 11.6 g/dL (ref 11.6–15.9)
MCH: 28.9 pg (ref 25.1–34.0)
MCHC: 32.7 g/dL (ref 31.5–36.0)
RBC: 4.01 10*6/uL (ref 3.70–5.45)
RDW: 15.6 % — ABNORMAL HIGH (ref 11.2–14.5)
lymph#: 0.5 10*3/uL — ABNORMAL LOW (ref 0.9–3.3)
nRBC: 0 % (ref 0–0)

## 2012-02-05 LAB — COMPREHENSIVE METABOLIC PANEL
AST: 16 U/L (ref 0–37)
Albumin: 3.5 g/dL (ref 3.5–5.2)
BUN: 16 mg/dL (ref 6–23)
Calcium: 9.1 mg/dL (ref 8.4–10.5)
Chloride: 105 mEq/L (ref 96–112)
Potassium: 3.8 mEq/L (ref 3.5–5.3)
Sodium: 140 mEq/L (ref 135–145)
Total Protein: 5.6 g/dL — ABNORMAL LOW (ref 6.0–8.3)

## 2012-02-05 LAB — PROTIME-INR
INR: 1.5 — ABNORMAL LOW (ref 2.00–3.50)
Protime: 18 Seconds — ABNORMAL HIGH (ref 10.6–13.4)

## 2012-02-05 MED ORDER — BORTEZOMIB CHEMO IV INJECTION 3.5 MG
1.3000 mg/m2 | Freq: Once | INTRAMUSCULAR | Status: AC
  Administered 2012-02-05: 2 mg via INTRAVENOUS
  Filled 2012-02-05: qty 2

## 2012-02-05 MED ORDER — SODIUM CHLORIDE 0.9 % IV SOLN
Freq: Once | INTRAVENOUS | Status: AC
  Administered 2012-02-05: 13:00:00 via INTRAVENOUS

## 2012-02-05 MED ORDER — ONDANSETRON 8 MG/50ML IVPB (CHCC)
8.0000 mg | Freq: Once | INTRAVENOUS | Status: AC
  Administered 2012-02-05: 8 mg via INTRAVENOUS

## 2012-02-05 NOTE — Progress Notes (Signed)
INR = 1.5  INR drawn but not planned/scheduled.  I have informed pt & she will increase Coumadin to 5 mg x 1 today then back to 2.5 mg Mon/Thurs; 5 mg other days. Pts dtr aware of plan. Recheck INR on 02/12/12 as originally planned. I have asked that the INR lab fee be credited for pt today since we did not order it to be drawn today. Marily Lente, Pharm.D.

## 2012-02-05 NOTE — Patient Instructions (Signed)
[facility] Discharge Instructions for Patients Receiving Chemotherapy  Today you received the following chemotherapy agents Velcade.  To help prevent nausea and vomiting after your treatment, we encourage you to take your nausea medication as prescribed.   If you develop nausea and vomiting that is not controlled by your nausea medication, call the clinic. If it is after clinic hours your family physician or the after hours number for the clinic or go to the Emergency Department.   BELOW ARE SYMPTOMS THAT SHOULD BE REPORTED IMMEDIATELY:  *FEVER GREATER THAN 100.5 F  *CHILLS WITH OR WITHOUT FEVER  NAUSEA AND VOMITING THAT IS NOT CONTROLLED WITH YOUR NAUSEA MEDICATION  *UNUSUAL SHORTNESS OF BREATH  *UNUSUAL BRUISING OR BLEEDING  TENDERNESS IN MOUTH AND THROAT WITH OR WITHOUT PRESENCE OF ULCERS  *URINARY PROBLEMS  *BOWEL PROBLEMS  UNUSUAL RASH Items with * indicate a potential emergency and should be followed up as soon as possible.  One of the nurses will contact you 24 hours after your treatment. Please let the nurse know about any problems that you may have experienced. Feel free to call the clinic you have any questions or concerns. The clinic phone number is [phone].   I have been informed and understand all the instructions given to me. I know to contact the clinic, my physician, or go to the Emergency Department if any problems should occur. I do not have any questions at this time, but understand that I may call the clinic during office hours   should I have any questions or need assistance in obtaining follow up care.    __________________________________________  _____________  __________ Signature of Patient or Authorized Representative            Date                   Time    __________________________________________ Nurse's Signature    

## 2012-02-10 ENCOUNTER — Other Ambulatory Visit: Payer: Self-pay | Admitting: *Deleted

## 2012-02-10 MED ORDER — GLIPIZIDE ER 2.5 MG PO TB24: 2.5000 mg | ORAL_TABLET | Freq: Every day | ORAL | Status: DC

## 2012-02-10 NOTE — Telephone Encounter (Signed)
Sent, needs OV re: DM.  We can do labs at OV.  Thanks.

## 2012-02-10 NOTE — Telephone Encounter (Signed)
Received faxed refill request from pharmacy. Last office visit 07/18/11. Is it okay to refill medication.

## 2012-02-12 ENCOUNTER — Ambulatory Visit (HOSPITAL_BASED_OUTPATIENT_CLINIC_OR_DEPARTMENT_OTHER): Payer: Medicare Other

## 2012-02-12 ENCOUNTER — Other Ambulatory Visit (HOSPITAL_BASED_OUTPATIENT_CLINIC_OR_DEPARTMENT_OTHER): Payer: Medicare Other | Admitting: Lab

## 2012-02-12 ENCOUNTER — Ambulatory Visit: Payer: Medicare Other | Admitting: Pharmacist

## 2012-02-12 VITALS — BP 135/84 | HR 64 | Temp 98.6°F

## 2012-02-12 DIAGNOSIS — Z5112 Encounter for antineoplastic immunotherapy: Secondary | ICD-10-CM

## 2012-02-12 DIAGNOSIS — C9000 Multiple myeloma not having achieved remission: Secondary | ICD-10-CM

## 2012-02-12 DIAGNOSIS — I82409 Acute embolism and thrombosis of unspecified deep veins of unspecified lower extremity: Secondary | ICD-10-CM

## 2012-02-12 DIAGNOSIS — Z7901 Long term (current) use of anticoagulants: Secondary | ICD-10-CM

## 2012-02-12 LAB — CBC WITH DIFFERENTIAL/PLATELET
Basophils Absolute: 0 10*3/uL (ref 0.0–0.1)
Eosinophils Absolute: 0.1 10*3/uL (ref 0.0–0.5)
HCT: 36.7 % (ref 34.8–46.6)
HGB: 12.1 g/dL (ref 11.6–15.9)
MCV: 88.6 fL (ref 79.5–101.0)
NEUT#: 2.9 10*3/uL (ref 1.5–6.5)
NEUT%: 74.6 % (ref 38.4–76.8)
RDW: 15.4 % — ABNORMAL HIGH (ref 11.2–14.5)
lymph#: 0.4 10*3/uL — ABNORMAL LOW (ref 0.9–3.3)

## 2012-02-12 LAB — PROTIME-INR
INR: 2.3 (ref 2.00–3.50)
Protime: 27.6 Seconds — ABNORMAL HIGH (ref 10.6–13.4)

## 2012-02-12 LAB — COMPREHENSIVE METABOLIC PANEL
Albumin: 3.8 g/dL (ref 3.5–5.2)
Alkaline Phosphatase: 47 U/L (ref 39–117)
BUN: 13 mg/dL (ref 6–23)
Calcium: 9.1 mg/dL (ref 8.4–10.5)
Creatinine, Ser: 1 mg/dL (ref 0.50–1.10)
Glucose, Bld: 142 mg/dL — ABNORMAL HIGH (ref 70–99)
Potassium: 3.9 mEq/L (ref 3.5–5.3)

## 2012-02-12 LAB — POCT INR: INR: 2.3

## 2012-02-12 MED ORDER — SODIUM CHLORIDE 0.9 % IV SOLN
Freq: Once | INTRAVENOUS | Status: AC
  Administered 2012-02-12: 11:00:00 via INTRAVENOUS

## 2012-02-12 MED ORDER — BORTEZOMIB CHEMO IV INJECTION 3.5 MG
1.3000 mg/m2 | Freq: Once | INTRAMUSCULAR | Status: AC
  Administered 2012-02-12: 2 mg via INTRAVENOUS
  Filled 2012-02-12: qty 2

## 2012-02-12 MED ORDER — ONDANSETRON 8 MG/50ML IVPB (CHCC)
8.0000 mg | Freq: Once | INTRAVENOUS | Status: AC
  Administered 2012-02-12: 8 mg via INTRAVENOUS

## 2012-02-12 NOTE — Telephone Encounter (Signed)
Left detailed message on A/M to schedule 30 min OV.

## 2012-02-12 NOTE — Patient Instructions (Addendum)
Continue the same dose = Coumadin 5mg  daily except 2.5mg  on Mondays and Thursdays.   Recheck INR on 02/19/12. A pharmacist will see you during treatment.

## 2012-02-12 NOTE — Progress Notes (Signed)
Continue the same dose = Coumadin 5mg  daily except 2.5mg  on Mondays and Thursdays.   Recheck INR on 02/19/12 with next treatment.

## 2012-02-16 ENCOUNTER — Other Ambulatory Visit: Payer: Self-pay | Admitting: Physician Assistant

## 2012-02-16 ENCOUNTER — Other Ambulatory Visit: Payer: Self-pay | Admitting: Internal Medicine

## 2012-02-16 DIAGNOSIS — I82409 Acute embolism and thrombosis of unspecified deep veins of unspecified lower extremity: Secondary | ICD-10-CM

## 2012-02-17 ENCOUNTER — Encounter: Payer: Self-pay | Admitting: Family Medicine

## 2012-02-17 ENCOUNTER — Ambulatory Visit (INDEPENDENT_AMBULATORY_CARE_PROVIDER_SITE_OTHER): Payer: Medicare Other | Admitting: Family Medicine

## 2012-02-17 ENCOUNTER — Other Ambulatory Visit: Payer: Self-pay | Admitting: *Deleted

## 2012-02-17 VITALS — BP 136/70 | HR 72 | Temp 98.8°F | Wt 124.0 lb

## 2012-02-17 DIAGNOSIS — C9000 Multiple myeloma not having achieved remission: Secondary | ICD-10-CM

## 2012-02-17 DIAGNOSIS — E119 Type 2 diabetes mellitus without complications: Secondary | ICD-10-CM

## 2012-02-17 DIAGNOSIS — I10 Essential (primary) hypertension: Secondary | ICD-10-CM

## 2012-02-17 MED ORDER — OXYCODONE HCL 10 MG PO TB12: 10.0000 mg | ORAL_TABLET | Freq: Two times a day (BID) | ORAL | Status: DC

## 2012-02-17 NOTE — Patient Instructions (Signed)
Go to the lab on the way out.  We'll contact you with your lab report. Don't change your meds for now.  Take care.   

## 2012-02-17 NOTE — Assessment & Plan Note (Signed)
Controlled, continue current meds.   

## 2012-02-17 NOTE — Assessment & Plan Note (Signed)
Controlled, based on recent sugars, check A1c today and continue current meds.

## 2012-02-17 NOTE — Progress Notes (Signed)
Mult Myeloma per [facility].  Pain is usually controlled.  She feels better on th weekends, likely due to taking her prednisone on Fridays.  Can do some housework.  Rare sweats.  Weight stable.  Tolerating meds w/o complication.   Diabetes:  Using medications without difficulties:yes Hypoglycemic episodes:no Hyperglycemic episodes:no Feet problems:no Blood Sugars averaging: ~100 Due for A1c  Hypertension:    Using medication without problems: yes Chest pain with exertion:no Edema:none now, present rarely and responds to elevation Short of breath:no  PMH and SH reviewed  Meds, vitals, and allergies reviewed.   ROS: See HPI.  Otherwise negative.    GEN: nad, alert and oriented HEENT: mucous membranes moist NECK: supple w/o LA CV: rrr. PULM: ctab, no inc wob ABD: soft, +bs EXT: no edema SKIN: no acute rash

## 2012-02-17 NOTE — Assessment & Plan Note (Signed)
With functional status improved from the last time I saw her.  Weight stable.  Will defer to [facility].

## 2012-02-18 LAB — HEMOGLOBIN A1C: Hgb A1c MFr Bld: 6 % (ref 4.6–6.5)

## 2012-02-19 ENCOUNTER — Ambulatory Visit (HOSPITAL_BASED_OUTPATIENT_CLINIC_OR_DEPARTMENT_OTHER): Payer: Medicare Other | Admitting: Physician Assistant

## 2012-02-19 ENCOUNTER — Encounter: Payer: Self-pay | Admitting: *Deleted

## 2012-02-19 ENCOUNTER — Ambulatory Visit (HOSPITAL_BASED_OUTPATIENT_CLINIC_OR_DEPARTMENT_OTHER): Payer: Medicare Other

## 2012-02-19 ENCOUNTER — Ambulatory Visit: Payer: Medicare Other | Admitting: Pharmacist

## 2012-02-19 ENCOUNTER — Telehealth: Payer: Self-pay | Admitting: Internal Medicine

## 2012-02-19 ENCOUNTER — Encounter: Payer: Self-pay | Admitting: Physician Assistant

## 2012-02-19 ENCOUNTER — Other Ambulatory Visit (HOSPITAL_BASED_OUTPATIENT_CLINIC_OR_DEPARTMENT_OTHER): Payer: Medicare Other | Admitting: Lab

## 2012-02-19 ENCOUNTER — Other Ambulatory Visit: Payer: Self-pay | Admitting: Internal Medicine

## 2012-02-19 VITALS — BP 135/65 | HR 75 | Temp 99.6°F | Ht 61.0 in | Wt 122.6 lb

## 2012-02-19 DIAGNOSIS — C9000 Multiple myeloma not having achieved remission: Secondary | ICD-10-CM

## 2012-02-19 DIAGNOSIS — Z5112 Encounter for antineoplastic immunotherapy: Secondary | ICD-10-CM

## 2012-02-19 DIAGNOSIS — I82409 Acute embolism and thrombosis of unspecified deep veins of unspecified lower extremity: Secondary | ICD-10-CM

## 2012-02-19 LAB — CBC WITH DIFFERENTIAL/PLATELET
Basophils Absolute: 0 10*3/uL (ref 0.0–0.1)
Eosinophils Absolute: 0.1 10*3/uL (ref 0.0–0.5)
HCT: 39 % (ref 34.8–46.6)
LYMPH%: 11.7 % — ABNORMAL LOW (ref 14.0–49.7)
MCV: 88.8 fL (ref 79.5–101.0)
MONO#: 0.4 10*3/uL (ref 0.1–0.9)
MONO%: 11.7 % (ref 0.0–14.0)
NEUT#: 2.8 10*3/uL (ref 1.5–6.5)
NEUT%: 75 % (ref 38.4–76.8)
Platelets: 202 10*3/uL (ref 145–400)
RBC: 4.39 10*6/uL (ref 3.70–5.45)

## 2012-02-19 LAB — PROTIME-INR: Protime: 30 Seconds — ABNORMAL HIGH (ref 10.6–13.4)

## 2012-02-19 LAB — COMPREHENSIVE METABOLIC PANEL
Alkaline Phosphatase: 45 U/L (ref 39–117)
Creatinine, Ser: 0.94 mg/dL (ref 0.50–1.10)
Glucose, Bld: 78 mg/dL (ref 70–99)
Sodium: 139 mEq/L (ref 135–145)
Total Bilirubin: 0.3 mg/dL (ref 0.3–1.2)
Total Protein: 6.1 g/dL (ref 6.0–8.3)

## 2012-02-19 MED ORDER — BORTEZOMIB CHEMO IV INJECTION 3.5 MG
1.3000 mg/m2 | Freq: Once | INTRAMUSCULAR | Status: AC
  Administered 2012-02-19: 2 mg via INTRAVENOUS
  Filled 2012-02-19: qty 2

## 2012-02-19 MED ORDER — SODIUM CHLORIDE 0.9 % IV SOLN
Freq: Once | INTRAVENOUS | Status: AC
  Administered 2012-02-19: 15:00:00 via INTRAVENOUS

## 2012-02-19 MED ORDER — ONDANSETRON 8 MG/50ML IVPB (CHCC)
8.0000 mg | Freq: Once | INTRAVENOUS | Status: AC
  Administered 2012-02-19: 8 mg via INTRAVENOUS

## 2012-02-19 NOTE — Patient Instructions (Addendum)
No changes to report.  Continue current dose of 5mg daily with 2.5 mg on Mon and Thur.  Will recheck with next infusion on 6/27.  

## 2012-02-19 NOTE — Telephone Encounter (Signed)
Gave pt appt calendar for June and July 2013 lab , chemo and ML 

## 2012-02-19 NOTE — Progress Notes (Signed)
Pt discharged from infusion rm with daughter.  No complaints

## 2012-02-19 NOTE — Patient Instructions (Addendum)
[facility] Discharge Instructions for Patients Receiving Chemotherapy  Today you received the following chemotherapy agents Velcade.  To help prevent nausea and vomiting after your treatment, we encourage you to take your nausea medication.   If you develop nausea and vomiting that is not controlled by your nausea medication, call the clinic. If it is after clinic hours your family physician or the after hours number for the clinic or go to the Emergency Department.   BELOW ARE SYMPTOMS THAT SHOULD BE REPORTED IMMEDIATELY:  *FEVER GREATER THAN 100.5 F  *CHILLS WITH OR WITHOUT FEVER  NAUSEA AND VOMITING THAT IS NOT CONTROLLED WITH YOUR NAUSEA MEDICATION  *UNUSUAL SHORTNESS OF BREATH  *UNUSUAL BRUISING OR BLEEDING  TENDERNESS IN MOUTH AND THROAT WITH OR WITHOUT PRESENCE OF ULCERS  *URINARY PROBLEMS  *BOWEL PROBLEMS  UNUSUAL RASH Items with * indicate a potential emergency and should be followed up as soon as possible.  One of the nurses will contact you 24 hours after your treatment. Please let the nurse know about any problems that you may have experienced. Feel free to call the clinic you have any questions or concerns. The clinic phone number is [phone].   I have been informed and understand all the instructions given to me. I know to contact the clinic, my physician, or go to the Emergency Department if any problems should occur. I do not have any questions at this time, but understand that I may call the clinic during office hours   should I have any questions or need assistance in obtaining follow up care.    __________________________________________  _____________  __________ Signature of Patient or Authorized Representative            Date                   Time    __________________________________________ Nurse's Signature    

## 2012-02-19 NOTE — Progress Notes (Signed)
No changes to report.  Continue current dose of 5mg  daily with 2.5 mg on Mon and Thur.  Will recheck with next infusion on 6/27.

## 2012-02-20 ENCOUNTER — Other Ambulatory Visit: Payer: Self-pay | Admitting: Internal Medicine

## 2012-02-20 ENCOUNTER — Telehealth: Payer: Self-pay | Admitting: Medical Oncology

## 2012-02-20 DIAGNOSIS — C9000 Multiple myeloma not having achieved remission: Secondary | ICD-10-CM

## 2012-02-20 MED ORDER — OXYCODONE HCL 5 MG PO TABS: ORAL_TABLET | ORAL | Status: DC

## 2012-02-20 NOTE — Telephone Encounter (Signed)
rx left at front for pick up and son notified.

## 2012-02-20 NOTE — Progress Notes (Signed)
[name] [phone]   [phone]  OFFICE PROGRESS NOTE  [name], MD [address] [place] [address]  DIAGNOSIS:  1. multiple myeloma diagnosed in September 2009,  2. history of bilateral lower extremity deep vein thrombosis diagnosed in November 2009  3. acute on chronic deep vein thrombosis in the left lower extremity diagnosed in October 2010.   PRIOR THERAPY:  1. status post palliative radiotherapy to the right femur and is she'll tuberosity, first was done in December 2011 and the second treatment was completed Jan 08 2011, and the third treatment to the right distal femur completed on 04/30/2011  2. status post palliative radiotherapy to the left proximal femur completed 02/28/2011.  3. Revlimid 25 mg by mouth daily for 21 days every 4 weeks in addition to oral Decadron at 40 mg by mouth on a weekly basis status post 37 cycles.   CURRENT THERAPY:  1. weekly subcutaneous Velcade 1.3 mg/M2 status post 19 cycles.  2. Coumadin 5 mg on Mondays and Thursdays and 2.5 mg on all other days.  3. Zometa 4 mg IV given every 2 months.   INTERVAL HISTORY: Yasmina Chico 76 y.o. female returns to the clinic today for followup visit accompanied by her daughter. The patient is doing fine and tolerating her treatment with Velcade and Decadron fairly well. She has occasional constipation. Her lower extremity pain is stable and well controlled on her current pain medications. She denied having night sweats, chest pain or shortness of breath.   MEDICAL HISTORY: Past Medical History  Diagnosis Date  . Blood transfusion   . Depression     Mild  . History of chicken pox   . Hyperlipidemia   . Hypertension   . Degenerative joint disease   . Diabetes mellitus     Steroid related  . Phlebitis   . Multiple myeloma     per Dr. [name], s/p palliative radiation for leg pain 2011 per Dr. Roselind Messier  . Deep vein thrombosis of bilateral lower  extremities     ALLERGIES:   has no known allergies.  MEDICATIONS:  Current Outpatient Prescriptions  Medication Sig Dispense Refill  . ALPRAZolam (XANAX) 0.5 MG tablet Take 0.5 mg by mouth 2 (two) times daily as needed.      . Calcium Carbonate-Vitamin D 600-400 MG-UNIT per tablet Take 1 tablet by mouth 3 (three) times daily with meals.        Marland Kitchen dexamethasone (DECADRON) 4 MG tablet TAKE 10 TABLETS BY MOUTH ONCE PER WEEK WITH FOOD AS DIRECTED  40 tablet  2  . glipiZIDE (GLUCOTROL XL) 2.5 MG 24 hr tablet Take 1 tablet (2.5 mg total) by mouth daily.  90 tablet  2  . glucose blood (ONE TOUCH ULTRA TEST) test strip Use as instructed  25 each  6  . ibuprofen (ADVIL,MOTRIN) 200 MG tablet Take 200 mg by mouth every 6 (six) hours as needed.        Marland Kitchen KLOR-CON M20 20 MEQ tablet TAKE ONE TABLET BY MOUTH EVERY DAY  30 each  1  . Lancets (ONETOUCH ULTRASOFT) lancets Test blood sugar once daily or as directed.  100 each  11  . lisinopril-hydrochlorothiazide (PRINZIDE,ZESTORETIC) 20-12.5 MG per tablet Take 0.5 tablets by mouth daily.       . magnesium oxide (MAG-OX) 400 MG tablet Take 400 mg by mouth daily.       Marland Kitchen oxyCODONE (OXY IR/ROXICODONE) 5 MG immediate  release tablet Take 1 1/2 tabs by mouth every 6 hours as needed for pain  90 tablet  0  . oxyCODONE (OXYCONTIN) 10 MG 12 hr tablet Take 1 tablet (10 mg total) by mouth every 12 (twelve) hours.  60 tablet  0  . prochlorperazine (COMPAZINE) 10 MG tablet       . temazepam (RESTORIL) 30 MG capsule TAKE ONE CAPSULE BY MOUTH AT BEDTIME AS NEEDED FOR SLEEP  30 capsule  0  . warfarin (COUMADIN) 5 MG tablet TAKE ONE TABLET BY MOUTH EVERY DAY ON TUESDAY, THURSDAY AND SATURDAY AND TAKE 1/2 TABLET ON ALL OTHER DAYS OR AS DIRECTED  60 tablet  0   No current facility-administered medications for this visit.   Facility-Administered Medications Ordered in Other Visits  Medication Dose Route Frequency Provider Last Rate Last Dose  . Zoledronic Acid (ZOMETA) 4 mg  IVPB  4 mg Intravenous Once Si Gaul, MD        SURGICAL HISTORY:  Past Surgical History  Procedure Date  . Abdominal hysterectomy     REVIEW OF SYSTEMS:  Pertinent items are noted in HPI.   PHYSICAL EXAMINATION: General appearance: alert, cooperative and no distress Neck: no adenopathy Lymph nodes: Cervical, supraclavicular, and axillary nodes normal. Resp: clear to auscultation bilaterally Cardio: regular rate and rhythm, S1, S2 normal, no murmur, click, rub or gallop GI: soft, non-tender; bowel sounds normal; no masses,  no organomegaly Extremities: extremities normal, atraumatic, no cyanosis or edema  ECOG PERFORMANCE STATUS: 1 - Symptomatic but completely ambulatory  Blood pressure 135/65, pulse 75, temperature 99.6 F (37.6 C), temperature source Oral, height 5\' 1"  (1.549 m), weight 122 lb 9.6 oz (55.611 kg).  LABORATORY DATA: Lab Results  Component Value Date   WBC 3.7* 02/19/2012   HGB 12.9 02/19/2012   HCT 39.0 02/19/2012   MCV 88.8 02/19/2012   PLT 202 02/19/2012      Chemistry      Component Value Date/Time   NA 139 02/19/2012 1328   K 3.7 02/19/2012 1328   CL 105 02/19/2012 1328   CO2 28 02/19/2012 1328   BUN 17 02/19/2012 1328   CREATININE 0.94 02/19/2012 1328      Component Value Date/Time   CALCIUM 9.4 02/19/2012 1328   CALCIUM  Value: 5.7 CRITICAL RESULT CALLED TO, READ BACK BY AND VERIFIED WITH: JAMIE TRACY,RN [id number] @ 1433 BY J SCOTTON (NOTE)  Amended report. Result repeated and verified. CORRECTED ON 10/14 AT 1429: PREVIOUSLY REPORTED AS Result repeated and verified.* 06/13/2008 1605   ALKPHOS 45 02/19/2012 1328   AST 22 02/19/2012 1328   ALT 14 02/19/2012 1328   BILITOT 0.3 02/19/2012 1328       RADIOGRAPHIC STUDIES: Mm Digital Screening  12/23/2011  DG SCREEN MAMMOGRAM BILATERAL Bilateral CC and MLO view(s) were taken.  DIGITAL SCREENING MAMMOGRAM WITH CAD: There are scattered fibroglandular densities.  No masses or malignant type calcifications  are  identified.  Compared with prior studies.  Images were processed with CAD.  IMPRESSION: No specific mammographic evidence of malignancy.  Next screening mammogram is recommended in one  year.  A result letter of this screening mammogram will be mailed directly to the patient.  ASSESSMENT: Negative - BI-RADS 1  Screening mammogram in 1 year. ,    ASSESSMENT/PLAN: This is a very pleasant 76 years old [place] American female with multiple myeloma currently on treatment with subcutaneous Velcade and weekly Decadron. The patient is tolerating her treatment fairly well with no significant  complaints and she has no evidence for disease progression on her las myeloma panel.The patient was discussed with Dr. [name]. She will continue on her current treatment. She will next be due for her Zometa infusion in July 2013. Her coumadin therapy continues to be managed by the [facility]. She will return in 3 weeks with with a repeat CBC Differential, CMET and LDH.  Tanyika Barros E, PA-C   She was advised to call immediately if she has any concerning symptoms in the interval.  All questions were answered. The patient knows to call the clinic with any problems, questions or concerns. We can certainly see the patient much sooner if necessary.

## 2012-02-21 ENCOUNTER — Other Ambulatory Visit: Payer: Self-pay | Admitting: Internal Medicine

## 2012-02-23 ENCOUNTER — Other Ambulatory Visit: Payer: Self-pay | Admitting: Medical Oncology

## 2012-02-23 ENCOUNTER — Telehealth: Payer: Self-pay | Admitting: Medical Oncology

## 2012-02-23 NOTE — Telephone Encounter (Signed)
Prescription  Did not get to pharmacy via [facility]  on 02/21/12 order per Dr Donnald Garre. I called in restoril rx.

## 2012-02-23 NOTE — Telephone Encounter (Signed)
rstoril refilled per Dr Donnald Garre 02/21/12

## 2012-02-25 ENCOUNTER — Other Ambulatory Visit: Payer: Self-pay | Admitting: Internal Medicine

## 2012-02-25 DIAGNOSIS — C9000 Multiple myeloma not having achieved remission: Secondary | ICD-10-CM

## 2012-02-25 DIAGNOSIS — G4700 Insomnia, unspecified: Secondary | ICD-10-CM

## 2012-02-25 DIAGNOSIS — F419 Anxiety disorder, unspecified: Secondary | ICD-10-CM

## 2012-02-26 ENCOUNTER — Ambulatory Visit (HOSPITAL_BASED_OUTPATIENT_CLINIC_OR_DEPARTMENT_OTHER): Payer: Medicare Other

## 2012-02-26 ENCOUNTER — Other Ambulatory Visit: Payer: Self-pay | Admitting: Internal Medicine

## 2012-02-26 ENCOUNTER — Other Ambulatory Visit: Payer: Medicare Other | Admitting: Lab

## 2012-02-26 ENCOUNTER — Ambulatory Visit (HOSPITAL_BASED_OUTPATIENT_CLINIC_OR_DEPARTMENT_OTHER): Payer: Medicare Other | Admitting: Pharmacist

## 2012-02-26 VITALS — BP 141/73 | HR 61 | Temp 98.4°F

## 2012-02-26 DIAGNOSIS — I82409 Acute embolism and thrombosis of unspecified deep veins of unspecified lower extremity: Secondary | ICD-10-CM

## 2012-02-26 DIAGNOSIS — C9000 Multiple myeloma not having achieved remission: Secondary | ICD-10-CM

## 2012-02-26 DIAGNOSIS — Z5112 Encounter for antineoplastic immunotherapy: Secondary | ICD-10-CM

## 2012-02-26 LAB — PROTIME-INR: INR: 3.6 — ABNORMAL HIGH (ref 2.00–3.50)

## 2012-02-26 LAB — COMPREHENSIVE METABOLIC PANEL
Albumin: 4.1 g/dL (ref 3.5–5.2)
Alkaline Phosphatase: 44 U/L (ref 39–117)
BUN: 12 mg/dL (ref 6–23)
CO2: 26 mEq/L (ref 19–32)
Glucose, Bld: 113 mg/dL — ABNORMAL HIGH (ref 70–99)
Potassium: 3.5 mEq/L (ref 3.5–5.3)
Total Bilirubin: 0.3 mg/dL (ref 0.3–1.2)

## 2012-02-26 LAB — CBC WITH DIFFERENTIAL/PLATELET
Basophils Absolute: 0 10*3/uL (ref 0.0–0.1)
Eosinophils Absolute: 0.1 10*3/uL (ref 0.0–0.5)
HCT: 37.6 % (ref 34.8–46.6)
HGB: 12.7 g/dL (ref 11.6–15.9)
LYMPH%: 13.5 % — ABNORMAL LOW (ref 14.0–49.7)
MCV: 87.6 fL (ref 79.5–101.0)
MONO%: 9.5 % (ref 0.0–14.0)
NEUT#: 2.6 10*3/uL (ref 1.5–6.5)
NEUT%: 75 % (ref 38.4–76.8)
Platelets: 196 10*3/uL (ref 145–400)

## 2012-02-26 MED ORDER — ONDANSETRON 8 MG/50ML IVPB (CHCC)
8.0000 mg | Freq: Once | INTRAVENOUS | Status: AC
  Administered 2012-02-26: 8 mg via INTRAVENOUS

## 2012-02-26 MED ORDER — SODIUM CHLORIDE 0.9 % IV SOLN
Freq: Once | INTRAVENOUS | Status: AC
  Administered 2012-02-26: 15:00:00 via INTRAVENOUS

## 2012-02-26 MED ORDER — BORTEZOMIB CHEMO IV INJECTION 3.5 MG
1.3000 mg/m2 | Freq: Once | INTRAMUSCULAR | Status: AC
  Administered 2012-02-26: 2 mg via INTRAVENOUS
  Filled 2012-02-26: qty 2

## 2012-02-26 NOTE — Progress Notes (Signed)
Hold Coumadin today only.  On 02/27/12, continue coumadin 5mg daily except 2.5mg on Mondays and Thursdays.  Recheck INR on 03/05/12. A pharmacist will see you during treatment. 

## 2012-02-26 NOTE — Patient Instructions (Addendum)
[facility] Discharge Instructions for Patients Receiving Chemotherapy  Today you received the following chemotherapy agents Velcade.  To help prevent nausea and vomiting after your treatment, we encourage you to take your nausea medication as prescribed.   If you develop nausea and vomiting that is not controlled by your nausea medication, call the clinic. If it is after clinic hours your family physician or the after hours number for the clinic or go to the Emergency Department.   BELOW ARE SYMPTOMS THAT SHOULD BE REPORTED IMMEDIATELY:  *FEVER GREATER THAN 100.5 F  *CHILLS WITH OR WITHOUT FEVER  NAUSEA AND VOMITING THAT IS NOT CONTROLLED WITH YOUR NAUSEA MEDICATION  *UNUSUAL SHORTNESS OF BREATH  *UNUSUAL BRUISING OR BLEEDING  TENDERNESS IN MOUTH AND THROAT WITH OR WITHOUT PRESENCE OF ULCERS  *URINARY PROBLEMS  *BOWEL PROBLEMS  UNUSUAL RASH Items with * indicate a potential emergency and should be followed up as soon as possible.  One of the nurses will contact you 24 hours after your treatment. Please let the nurse know about any problems that you may have experienced. Feel free to call the clinic you have any questions or concerns. The clinic phone number is [phone].   I have been informed and understand all the instructions given to me. I know to contact the clinic, my physician, or go to the Emergency Department if any problems should occur. I do not have any questions at this time, but understand that I may call the clinic during office hours   should I have any questions or need assistance in obtaining follow up care.    __________________________________________  _____________  __________ Signature of Patient or Authorized Representative            Date                   Time    __________________________________________ Nurse's Signature    

## 2012-02-26 NOTE — Patient Instructions (Signed)
Hold Coumadin today only.  On 02/27/12, continue coumadin 5mg  daily except 2.5mg  on Mondays and Thursdays.  Recheck INR on 03/05/12. A pharmacist will see you during treatment.

## 2012-03-05 ENCOUNTER — Other Ambulatory Visit (HOSPITAL_BASED_OUTPATIENT_CLINIC_OR_DEPARTMENT_OTHER): Payer: Medicare Other | Admitting: Lab

## 2012-03-05 ENCOUNTER — Ambulatory Visit (HOSPITAL_BASED_OUTPATIENT_CLINIC_OR_DEPARTMENT_OTHER): Payer: Medicare Other

## 2012-03-05 ENCOUNTER — Telehealth: Payer: Self-pay | Admitting: Internal Medicine

## 2012-03-05 ENCOUNTER — Ambulatory Visit: Payer: Medicare Other | Admitting: Pharmacist

## 2012-03-05 VITALS — BP 154/85 | HR 84 | Temp 98.5°F

## 2012-03-05 DIAGNOSIS — C3490 Malignant neoplasm of unspecified part of unspecified bronchus or lung: Secondary | ICD-10-CM

## 2012-03-05 DIAGNOSIS — I82409 Acute embolism and thrombosis of unspecified deep veins of unspecified lower extremity: Secondary | ICD-10-CM

## 2012-03-05 DIAGNOSIS — C9000 Multiple myeloma not having achieved remission: Secondary | ICD-10-CM

## 2012-03-05 LAB — COMPREHENSIVE METABOLIC PANEL
Albumin: 3.5 g/dL (ref 3.5–5.2)
CO2: 26 mEq/L (ref 19–32)
Glucose, Bld: 138 mg/dL — ABNORMAL HIGH (ref 70–99)
Sodium: 141 mEq/L (ref 135–145)
Total Bilirubin: 0.2 mg/dL — ABNORMAL LOW (ref 0.3–1.2)
Total Protein: 6.8 g/dL (ref 6.0–8.3)

## 2012-03-05 LAB — CBC WITH DIFFERENTIAL/PLATELET
Eosinophils Absolute: 0 10*3/uL (ref 0.0–0.5)
HCT: 39.4 % (ref 34.8–46.6)
HGB: 13 g/dL (ref 11.6–15.9)
LYMPH%: 5 % — ABNORMAL LOW (ref 14.0–49.7)
MONO#: 0 10*3/uL — ABNORMAL LOW (ref 0.1–0.9)
NEUT#: 3.6 10*3/uL (ref 1.5–6.5)
Platelets: 197 10*3/uL (ref 145–400)
RBC: 4.49 10*6/uL (ref 3.70–5.45)
WBC: 3.8 10*3/uL — ABNORMAL LOW (ref 3.9–10.3)

## 2012-03-05 LAB — PROTIME-INR

## 2012-03-05 MED ORDER — HEPARIN SOD (PORK) LOCK FLUSH 100 UNIT/ML IV SOLN
500.0000 [IU] | Freq: Once | INTRAVENOUS | Status: DC | PRN
  Filled 2012-03-05: qty 5

## 2012-03-05 MED ORDER — BORTEZOMIB CHEMO IV INJECTION 3.5 MG
1.3000 mg/m2 | Freq: Once | INTRAMUSCULAR | Status: AC
  Administered 2012-03-05: 2 mg via INTRAVENOUS
  Filled 2012-03-05: qty 2

## 2012-03-05 MED ORDER — SODIUM CHLORIDE 0.9 % IJ SOLN
10.0000 mL | INTRAMUSCULAR | Status: DC | PRN
  Filled 2012-03-05: qty 10

## 2012-03-05 MED ORDER — SODIUM CHLORIDE 0.9 % IV SOLN
Freq: Once | INTRAVENOUS | Status: AC
  Administered 2012-03-05: 15:00:00 via INTRAVENOUS

## 2012-03-05 MED ORDER — ONDANSETRON 8 MG/50ML IVPB (CHCC)
8.0000 mg | Freq: Once | INTRAVENOUS | Status: AC
  Administered 2012-03-05: 8 mg via INTRAVENOUS

## 2012-03-05 NOTE — Patient Instructions (Signed)
[facility] Discharge Instructions for Patients Receiving Chemotherapy  Today you received the following chemotherapy agents Velcade  To help prevent nausea and vomiting after your treatment, we encourage you to take your nausea medication  Begin taking it at 7 pm and take it as often as prescribed for the next 24 to 72 hours.   If you develop nausea and vomiting that is not controlled by your nausea medication, call the clinic. If it is after clinic hours your family physician or the after hours number for the clinic or go to the Emergency Department.   BELOW ARE SYMPTOMS THAT SHOULD BE REPORTED IMMEDIATELY:  *FEVER GREATER THAN 100.5 F  *CHILLS WITH OR WITHOUT FEVER  NAUSEA AND VOMITING THAT IS NOT CONTROLLED WITH YOUR NAUSEA MEDICATION  *UNUSUAL SHORTNESS OF BREATH  *UNUSUAL BRUISING OR BLEEDING  TENDERNESS IN MOUTH AND THROAT WITH OR WITHOUT PRESENCE OF ULCERS  *URINARY PROBLEMS  *BOWEL PROBLEMS  UNUSUAL RASH Items with * indicate a potential emergency and should be followed up as soon as possible.  One of the nurses will contact you 24 hours after your treatment. Please let the nurse know about any problems that you may have experienced. Feel free to call the clinic you have any questions or concerns. The clinic phone number is [phone].   I have been informed and understand all the instructions given to me. I know to contact the clinic, my physician, or go to the Emergency Department if any problems should occur. I do not have any questions at this time, but understand that I may call the clinic during office hours   should I have any questions or need assistance in obtaining follow up care.    __________________________________________  _____________  __________ Signature of Patient or Authorized Representative            Date                   Time    __________________________________________ Nurse's Signature    

## 2012-03-05 NOTE — Telephone Encounter (Signed)
Gave pt calendar appt for July and August 2013 lab,chemo and MD

## 2012-03-05 NOTE — Progress Notes (Signed)
INR = 2.5 on 5 mg/day; 2.5 mg M/Th. No complaints. Today pt is getting tx. INR therapeutic.  Cont same dose. Repeat INR in 2 weeks. Marily Lente, Pharm.D.

## 2012-03-11 ENCOUNTER — Ambulatory Visit (HOSPITAL_BASED_OUTPATIENT_CLINIC_OR_DEPARTMENT_OTHER): Payer: Medicare Other | Admitting: Physician Assistant

## 2012-03-11 ENCOUNTER — Ambulatory Visit (HOSPITAL_BASED_OUTPATIENT_CLINIC_OR_DEPARTMENT_OTHER): Payer: Medicare Other

## 2012-03-11 ENCOUNTER — Other Ambulatory Visit (HOSPITAL_BASED_OUTPATIENT_CLINIC_OR_DEPARTMENT_OTHER): Payer: Medicare Other | Admitting: Lab

## 2012-03-11 ENCOUNTER — Telehealth: Payer: Self-pay | Admitting: Internal Medicine

## 2012-03-11 VITALS — BP 143/80 | HR 82 | Temp 98.8°F | Ht 61.0 in | Wt 124.7 lb

## 2012-03-11 DIAGNOSIS — C9000 Multiple myeloma not having achieved remission: Secondary | ICD-10-CM

## 2012-03-11 DIAGNOSIS — Z5112 Encounter for antineoplastic immunotherapy: Secondary | ICD-10-CM

## 2012-03-11 LAB — COMPREHENSIVE METABOLIC PANEL WITH GFR
ALT: 16 U/L (ref 0–35)
AST: 22 U/L (ref 0–37)
Albumin: 4 g/dL (ref 3.5–5.2)
Alkaline Phosphatase: 48 U/L (ref 39–117)
BUN: 11 mg/dL (ref 6–23)
CO2: 29 meq/L (ref 19–32)
Calcium: 9.5 mg/dL (ref 8.4–10.5)
Chloride: 101 meq/L (ref 96–112)
Creatinine, Ser: 0.9 mg/dL (ref 0.50–1.10)
Glucose, Bld: 82 mg/dL (ref 70–99)
Potassium: 3.4 meq/L — ABNORMAL LOW (ref 3.5–5.3)
Sodium: 141 meq/L (ref 135–145)
Total Bilirubin: 0.3 mg/dL (ref 0.3–1.2)
Total Protein: 6.4 g/dL (ref 6.0–8.3)

## 2012-03-11 LAB — CBC WITH DIFFERENTIAL/PLATELET
BASO%: 0.2 % (ref 0.0–2.0)
Basophils Absolute: 0 10e3/uL (ref 0.0–0.1)
EOS%: 1.7 % (ref 0.0–7.0)
Eosinophils Absolute: 0.1 10e3/uL (ref 0.0–0.5)
HCT: 38.5 % (ref 34.8–46.6)
HGB: 12.6 g/dL (ref 11.6–15.9)
LYMPH%: 11.9 % — ABNORMAL LOW (ref 14.0–49.7)
MCH: 29.1 pg (ref 25.1–34.0)
MCHC: 32.7 g/dL (ref 31.5–36.0)
MCV: 88.9 fL (ref 79.5–101.0)
MONO#: 0.5 10e3/uL (ref 0.1–0.9)
MONO%: 11.6 % (ref 0.0–14.0)
NEUT#: 3 10e3/uL (ref 1.5–6.5)
NEUT%: 74.6 % (ref 38.4–76.8)
Platelets: 154 10e3/uL (ref 145–400)
RBC: 4.33 10e6/uL (ref 3.70–5.45)
RDW: 14.5 % (ref 11.2–14.5)
WBC: 4.1 10e3/uL (ref 3.9–10.3)
lymph#: 0.5 10e3/uL — ABNORMAL LOW (ref 0.9–3.3)
nRBC: 0 % (ref 0–0)

## 2012-03-11 MED ORDER — ONDANSETRON 8 MG/50ML IVPB (CHCC)
8.0000 mg | Freq: Once | INTRAVENOUS | Status: AC
  Administered 2012-03-11: 8 mg via INTRAVENOUS

## 2012-03-11 MED ORDER — SODIUM CHLORIDE 0.9 % IV SOLN
Freq: Once | INTRAVENOUS | Status: AC
  Administered 2012-03-11: 15:00:00 via INTRAVENOUS

## 2012-03-11 MED ORDER — BORTEZOMIB CHEMO IV INJECTION 3.5 MG
1.3000 mg/m2 | Freq: Once | INTRAMUSCULAR | Status: AC
  Administered 2012-03-11: 2 mg via INTRAVENOUS
  Filled 2012-03-11: qty 2

## 2012-03-11 NOTE — Telephone Encounter (Signed)
Gave pt appt calendar for July  And August 2013 lab, chemo, ML

## 2012-03-12 ENCOUNTER — Other Ambulatory Visit: Payer: Self-pay | Admitting: Internal Medicine

## 2012-03-12 DIAGNOSIS — C9000 Multiple myeloma not having achieved remission: Secondary | ICD-10-CM

## 2012-03-15 NOTE — Progress Notes (Signed)
[facility] Telephone:([phone]   Fax:([phone]  OFFICE PROGRESS NOTE  [name], MD [address] [place] [address]  DIAGNOSIS:  1. multiple myeloma diagnosed in September 2009,  2. history of bilateral lower extremity deep vein thrombosis diagnosed in November 2009  3. acute on chronic deep vein thrombosis in the left lower extremity diagnosed in October 2010.   PRIOR THERAPY:  1. status post palliative radiotherapy to the right femur and is she'll tuberosity, first was done in December 2011 and the second treatment was completed Jan 08 2011, and the third treatment to the right distal femur completed on 04/30/2011  2. status post palliative radiotherapy to the left proximal femur completed 02/28/2011.  3. Revlimid 25 mg by mouth daily for 21 days every 4 weeks in addition to oral Decadron at 40 mg by mouth on a weekly basis status post 37 cycles.   CURRENT THERAPY:  1. weekly subcutaneous Velcade 1.3 mg/M2 status post 29 cycles.  2. Coumadin 5 mg on Mondays and Thursdays and 2.5 mg on all other days.  3. Zometa 4 mg IV given every 2 months.   INTERVAL HISTORY: [name] 76 y.o. female returns to the clinic today for followup visit accompanied by her daughter. The patient is doing fine and tolerating her treatment with Velcade and Decadron fairly well. Today she voices no complaints. She states that the family plans a trip to the beach around August 30 for her bout 4 days. She is looking forward to his trip.  Her lower extremity pain is stable and well controlled on her current pain medications. She denied having night sweats, chest pain or shortness of breath.   MEDICAL HISTORY: Past Medical History  Diagnosis Date  . Blood transfusion   . Depression     Mild  . History of chicken pox   . Hyperlipidemia   . Hypertension   . Degenerative joint disease   . Diabetes mellitus     Steroid related  . Phlebitis   . Multiple myeloma       per Dr. [name], s/p palliative radiation for leg pain 2011 per Dr. Roselind Messier  . Deep vein thrombosis of bilateral lower extremities     ALLERGIES:   has no known allergies.  MEDICATIONS:  Current Outpatient Prescriptions  Medication Sig Dispense Refill  . ALPRAZolam (XANAX) 0.5 MG tablet TAKE ONE TABLET BY MOUTH THREE TIMES DAILY AS NEEDED FOR SLEEP OR  ANXIETY  60 tablet  0  . Calcium Carbonate-Vitamin D 600-400 MG-UNIT per tablet Take 1 tablet by mouth 3 (three) times daily with meals.        Marland Kitchen dexamethasone (DECADRON) 4 MG tablet TAKE 10 TABLETS BY MOUTH ONCE PER WEEK WITH FOOD AS DIRECTED  40 tablet  3  . glipiZIDE (GLUCOTROL XL) 2.5 MG 24 hr tablet Take 1 tablet (2.5 mg total) by mouth daily.  90 tablet  2  . glucose blood (ONE TOUCH ULTRA TEST) test strip Use as instructed  25 each  6  . ibuprofen (ADVIL,MOTRIN) 200 MG tablet Take 200 mg by mouth every 6 (six) hours as needed.        Marland Kitchen KLOR-CON M20 20 MEQ tablet TAKE ONE TABLET BY MOUTH EVERY DAY  30 each  1  . Lancets (ONETOUCH ULTRASOFT) lancets Test blood sugar once daily or as directed.  100 each  11  . lisinopril-hydrochlorothiazide (PRINZIDE,ZESTORETIC) 20-12.5 MG per tablet Take 0.5 tablets by  mouth daily.       . magnesium oxide (MAG-OX) 400 MG tablet Take 400 mg by mouth daily.       Marland Kitchen oxyCODONE (OXY IR/ROXICODONE) 5 MG immediate release tablet Take 1 1/2 tabs by mouth every 6 hours as needed for pain  90 tablet  0  . oxyCODONE (OXYCONTIN) 10 MG 12 hr tablet Take 1 tablet (10 mg total) by mouth every 12 (twelve) hours.  60 tablet  0  . prochlorperazine (COMPAZINE) 10 MG tablet       . temazepam (RESTORIL) 30 MG capsule TAKE ONE CAPSULE BY MOUTH AT BEDTIME AS NEEDED FOR SLEEP  30 capsule  0  . warfarin (COUMADIN) 5 MG tablet TAKE ONE TABLET BY MOUTH EVERY DAY ON TUESDAY, THURSDAY AND SATURDAY AND TAKE 1/2 TABLET ON ALL OTHER DAYS OR AS DIRECTED  60 tablet  0   No current facility-administered medications for this visit.    Facility-Administered Medications Ordered in Other Visits  Medication Dose Route Frequency Provider Last Rate Last Dose  . Zoledronic Acid (ZOMETA) 4 mg IVPB  4 mg Intravenous Once Si Gaul, MD        SURGICAL HISTORY:  Past Surgical History  Procedure Date  . Abdominal hysterectomy     REVIEW OF SYSTEMS:  Pertinent items are noted in HPI.   PHYSICAL EXAMINATION: General appearance: alert, cooperative and no distress Neck: no adenopathy Lymph nodes: Cervical, supraclavicular, and axillary nodes normal. Resp: clear to auscultation bilaterally Cardio: regular rate and rhythm, S1, S2 normal, no murmur, click, rub or gallop GI: soft, non-tender; bowel sounds normal; no masses,  no organomegaly Extremities: extremities normal, atraumatic, no cyanosis or edema  ECOG PERFORMANCE STATUS: 1 - Symptomatic but completely ambulatory  Blood pressure 143/80, pulse 82, temperature 98.8 F (37.1 C), temperature source Oral, height 5\' 1"  (1.549 m), weight 124 lb 11.2 oz (56.564 kg).  LABORATORY DATA: Lab Results  Component Value Date   WBC 4.1 03/11/2012   HGB 12.6 03/11/2012   HCT 38.5 03/11/2012   MCV 88.9 03/11/2012   PLT 154 03/11/2012      Chemistry      Component Value Date/Time   NA 141 03/11/2012 1313   K 3.4* 03/11/2012 1313   CL 101 03/11/2012 1313   CO2 29 03/11/2012 1313   BUN 11 03/11/2012 1313   CREATININE 0.90 03/11/2012 1313      Component Value Date/Time   CALCIUM 9.5 03/11/2012 1313   CALCIUM  Value: 5.7 CRITICAL RESULT CALLED TO, READ BACK BY AND VERIFIED WITH: JAMIE TRACY,RN [id number] @ 1433 BY J SCOTTON (NOTE)  Amended report. Result repeated and verified. CORRECTED ON 10/14 AT 1429: PREVIOUSLY REPORTED AS Result repeated and verified.* 06/13/2008 1605   ALKPHOS 48 03/11/2012 1313   AST 22 03/11/2012 1313   ALT 16 03/11/2012 1313   BILITOT 0.3 03/11/2012 1313       RADIOGRAPHIC STUDIES: Mm Digital Screening  12/23/2011  DG SCREEN MAMMOGRAM BILATERAL Bilateral CC  and MLO view(s) were taken.  DIGITAL SCREENING MAMMOGRAM WITH CAD: There are scattered fibroglandular densities.  No masses or malignant type calcifications are  identified.  Compared with prior studies.  Images were processed with CAD.  IMPRESSION: No specific mammographic evidence of malignancy.  Next screening mammogram is recommended in one  year.  A result letter of this screening mammogram will be mailed directly to the patient.  ASSESSMENT: Negative - BI-RADS 1  Screening mammogram in 1 year. ,    ASSESSMENT/PLAN:  This is a very pleasant 76 years old [place] American female with multiple myeloma currently on treatment with subcutaneous Velcade and weekly Decadron. The patient is tolerating her treatment fairly well with no significant complaints and she has no evidence for disease progression on her las myeloma panel.The patient was discussed with Dr. [name]. She will continue on her current treatment. She will  due for her Zometa infusion on March 25, 2012. Her coumadin therapy continues to be managed by the [facility]. She will return in 3 weeks with with a repeat CBC Differential, CMET and LDH.  [name] E, PA-C   She was advised to call immediately if she has any concerning symptoms in the interval.  All questions were answered. The patient knows to call the clinic with any problems, questions or concerns. We can certainly see the patient much sooner if necessary.

## 2012-03-16 ENCOUNTER — Other Ambulatory Visit: Payer: Self-pay | Admitting: *Deleted

## 2012-03-16 DIAGNOSIS — C9000 Multiple myeloma not having achieved remission: Secondary | ICD-10-CM

## 2012-03-16 MED ORDER — OXYCODONE HCL 5 MG PO TABS: ORAL_TABLET | ORAL | Status: DC

## 2012-03-16 MED ORDER — OXYCODONE HCL 10 MG PO TB12: 10.0000 mg | ORAL_TABLET | Freq: Two times a day (BID) | ORAL | Status: DC

## 2012-03-18 ENCOUNTER — Ambulatory Visit: Payer: Medicare Other | Admitting: Physician Assistant

## 2012-03-18 ENCOUNTER — Ambulatory Visit (HOSPITAL_BASED_OUTPATIENT_CLINIC_OR_DEPARTMENT_OTHER): Payer: Medicare Other

## 2012-03-18 ENCOUNTER — Ambulatory Visit: Payer: Medicare Other | Admitting: Pharmacist

## 2012-03-18 ENCOUNTER — Other Ambulatory Visit (HOSPITAL_BASED_OUTPATIENT_CLINIC_OR_DEPARTMENT_OTHER): Payer: Medicare Other | Admitting: Lab

## 2012-03-18 DIAGNOSIS — I82409 Acute embolism and thrombosis of unspecified deep veins of unspecified lower extremity: Secondary | ICD-10-CM

## 2012-03-18 DIAGNOSIS — C9000 Multiple myeloma not having achieved remission: Secondary | ICD-10-CM

## 2012-03-18 DIAGNOSIS — Z5112 Encounter for antineoplastic immunotherapy: Secondary | ICD-10-CM

## 2012-03-18 DIAGNOSIS — Z7901 Long term (current) use of anticoagulants: Secondary | ICD-10-CM

## 2012-03-18 LAB — CBC WITH DIFFERENTIAL/PLATELET
BASO%: 0.5 % (ref 0.0–2.0)
MCHC: 33.2 g/dL (ref 31.5–36.0)
MONO#: 0.5 10*3/uL (ref 0.1–0.9)
RBC: 4.29 10*6/uL (ref 3.70–5.45)
WBC: 3.9 10*3/uL (ref 3.9–10.3)
lymph#: 0.4 10*3/uL — ABNORMAL LOW (ref 0.9–3.3)
nRBC: 0 % (ref 0–0)

## 2012-03-18 LAB — COMPREHENSIVE METABOLIC PANEL
ALT: 14 U/L (ref 0–35)
CO2: 29 mEq/L (ref 19–32)
Calcium: 9.1 mg/dL (ref 8.4–10.5)
Chloride: 102 mEq/L (ref 96–112)
Creatinine, Ser: 0.88 mg/dL (ref 0.50–1.10)
Glucose, Bld: 111 mg/dL — ABNORMAL HIGH (ref 70–99)
Sodium: 141 mEq/L (ref 135–145)
Total Protein: 6.1 g/dL (ref 6.0–8.3)

## 2012-03-18 LAB — POCT INR: INR: 3.1

## 2012-03-18 LAB — PROTIME-INR
INR: 3.1 (ref 2.00–3.50)
Protime: 37.2 Seconds — ABNORMAL HIGH (ref 10.6–13.4)

## 2012-03-18 MED ORDER — SODIUM CHLORIDE 0.9 % IV SOLN: Freq: Once | INTRAVENOUS | Status: DC

## 2012-03-18 MED ORDER — SODIUM CHLORIDE 0.9 % IV SOLN
Freq: Once | INTRAVENOUS | Status: AC
  Administered 2012-03-18: 14:00:00 via INTRAVENOUS

## 2012-03-18 MED ORDER — BORTEZOMIB CHEMO IV INJECTION 3.5 MG
1.3000 mg/m2 | Freq: Once | INTRAMUSCULAR | Status: AC
  Administered 2012-03-18: 2 mg via INTRAVENOUS
  Filled 2012-03-18: qty 2

## 2012-03-18 MED ORDER — ZOLEDRONIC ACID 4 MG/100ML IV SOLN
4.0000 mg | Freq: Once | INTRAVENOUS | Status: AC
  Administered 2012-03-18: 4 mg via INTRAVENOUS
  Filled 2012-03-18: qty 100

## 2012-03-18 MED ORDER — ONDANSETRON 8 MG/50ML IVPB (CHCC)
8.0000 mg | Freq: Once | INTRAVENOUS | Status: AC
  Administered 2012-03-18: 8 mg via INTRAVENOUS

## 2012-03-18 NOTE — Progress Notes (Signed)
INR = 3.1 on 5 mg/day; 2.5 mg M/Th No missed doses.  Doing fine w/ her Coumadin. INR at goal so we'll keep her dose the same. Repeat protime in 2 weeks w/ next tx. Marily Lente, Pharm.D.

## 2012-03-18 NOTE — Patient Instructions (Signed)
[facility] Discharge Instructions for Patients Receiving Chemotherapy  Today you received the following chemotherapy agents VELCADE To help prevent nausea and vomiting after your treatment, we encourage you to take your nausea medication   Take it as often as prescribed.   If you develop nausea and vomiting that is not controlled by your nausea medication, call the clinic. If it is after clinic hours your family physician or the after hours number for the clinic or go to the Emergency Department.   BELOW ARE SYMPTOMS THAT SHOULD BE REPORTED IMMEDIATELY:  *FEVER GREATER THAN 100.5 F  *CHILLS WITH OR WITHOUT FEVER  NAUSEA AND VOMITING THAT IS NOT CONTROLLED WITH YOUR NAUSEA MEDICATION  *UNUSUAL SHORTNESS OF BREATH  *UNUSUAL BRUISING OR BLEEDING  TENDERNESS IN MOUTH AND THROAT WITH OR WITHOUT PRESENCE OF ULCERS  *URINARY PROBLEMS  *BOWEL PROBLEMS  UNUSUAL RASH Items with * indicate a potential emergency and should be followed up as soon as possible.  If this is your first treatment one of the nurses will contact you 24 hours after your treatment. Please let the nurse know about any problems that you may have experienced. Feel free to call the clinic you have any questions or concerns. The clinic phone number is [phone].   I have been informed and understand all the instructions given to me. I know to contact the clinic, my physician, or go to the Emergency Department if any problems should occur. I do not have any questions at this time, but understand that I may call the clinic during office hours   should I have any questions or need assistance in obtaining follow up care.    __________________________________________  _____________  __________ Signature of Patient or Authorized Representative            Date                   Time    __________________________________________ Nurse's Signature     

## 2012-03-23 ENCOUNTER — Other Ambulatory Visit: Payer: Self-pay | Admitting: Internal Medicine

## 2012-03-24 ENCOUNTER — Other Ambulatory Visit: Payer: Self-pay | Admitting: Internal Medicine

## 2012-03-25 ENCOUNTER — Ambulatory Visit (HOSPITAL_BASED_OUTPATIENT_CLINIC_OR_DEPARTMENT_OTHER): Payer: Medicare Other

## 2012-03-25 ENCOUNTER — Ambulatory Visit: Payer: Self-pay | Admitting: Pharmacist

## 2012-03-25 ENCOUNTER — Other Ambulatory Visit (HOSPITAL_BASED_OUTPATIENT_CLINIC_OR_DEPARTMENT_OTHER): Payer: Medicare Other | Admitting: Lab

## 2012-03-25 ENCOUNTER — Other Ambulatory Visit: Payer: Self-pay | Admitting: Physician Assistant

## 2012-03-25 VITALS — BP 126/60 | HR 77 | Temp 98.8°F

## 2012-03-25 DIAGNOSIS — I82409 Acute embolism and thrombosis of unspecified deep veins of unspecified lower extremity: Secondary | ICD-10-CM

## 2012-03-25 DIAGNOSIS — C9000 Multiple myeloma not having achieved remission: Secondary | ICD-10-CM

## 2012-03-25 DIAGNOSIS — Z5112 Encounter for antineoplastic immunotherapy: Secondary | ICD-10-CM

## 2012-03-25 LAB — PROTIME-INR: Protime: 42 Seconds — ABNORMAL HIGH (ref 10.6–13.4)

## 2012-03-25 LAB — CBC WITH DIFFERENTIAL/PLATELET
BASO%: 0 % (ref 0.0–2.0)
LYMPH%: 12.2 % — ABNORMAL LOW (ref 14.0–49.7)
MCHC: 32.8 g/dL (ref 31.5–36.0)
MONO#: 0.4 10*3/uL (ref 0.1–0.9)
RBC: 4.29 10*6/uL (ref 3.70–5.45)
RDW: 14.2 % (ref 11.2–14.5)
WBC: 3.4 10*3/uL — ABNORMAL LOW (ref 3.9–10.3)
lymph#: 0.4 10*3/uL — ABNORMAL LOW (ref 0.9–3.3)

## 2012-03-25 LAB — COMPREHENSIVE METABOLIC PANEL
ALT: 15 U/L (ref 0–35)
Albumin: 3.6 g/dL (ref 3.5–5.2)
CO2: 32 mEq/L (ref 19–32)
Calcium: 9 mg/dL (ref 8.4–10.5)
Chloride: 102 mEq/L (ref 96–112)
Sodium: 142 mEq/L (ref 135–145)
Total Protein: 5.7 g/dL — ABNORMAL LOW (ref 6.0–8.3)

## 2012-03-25 MED ORDER — BORTEZOMIB CHEMO IV INJECTION 3.5 MG
1.3000 mg/m2 | Freq: Once | INTRAMUSCULAR | Status: AC
  Administered 2012-03-25: 2 mg via INTRAVENOUS
  Filled 2012-03-25: qty 2

## 2012-03-25 MED ORDER — SODIUM CHLORIDE 0.9 % IV SOLN
Freq: Once | INTRAVENOUS | Status: AC
  Administered 2012-03-25: 14:00:00 via INTRAVENOUS

## 2012-03-25 MED ORDER — ONDANSETRON 8 MG/50ML IVPB (CHCC)
8.0000 mg | Freq: Once | INTRAVENOUS | Status: AC
  Administered 2012-03-25: 8 mg via INTRAVENOUS

## 2012-03-25 NOTE — Progress Notes (Signed)
INR = 3.5 on 5 mg/day; 2.5 mg Mon/Thurs. Not scheduled for Coumadin clinic today.  Lab drawn for chemo.  Per lab, pt requested INR. Pt w/o complaints re: anticoag.  No bleeding.  No new meds, etc. INR a little above goal today.  Hold x 1 dose then resume usual dose. Repeat INR next week w/ tx as previously planned. INR credited. Marily Lente, Pharm.D.

## 2012-03-25 NOTE — Patient Instructions (Addendum)
[facility] Discharge Instructions for Patients Receiving Chemotherapy  Today you received the following chemotherapy agents Velcade  To help prevent nausea and vomiting after your treatment, we encourage you to take your nausea medication Compazine 10 mg. Begin taking it anytime upon discharge and take it as often as prescribed for the next 48 hours and as needed.   If you develop nausea and vomiting that is not controlled by your nausea medication, call the clinic. If it is after clinic hours your family physician or the after hours number for the clinic or go to the Emergency Department.   BELOW ARE SYMPTOMS THAT SHOULD BE REPORTED IMMEDIATELY:  *FEVER GREATER THAN 100.5 F  *CHILLS WITH OR WITHOUT FEVER  NAUSEA AND VOMITING THAT IS NOT CONTROLLED WITH YOUR NAUSEA MEDICATION  *UNUSUAL SHORTNESS OF BREATH  *UNUSUAL BRUISING OR BLEEDING  TENDERNESS IN MOUTH AND THROAT WITH OR WITHOUT PRESENCE OF ULCERS  *URINARY PROBLEMS  *BOWEL PROBLEMS  UNUSUAL RASH Items with * indicate a potential emergency and should be followed up as soon as possible.  Please call to let a nurse know about any problems that you may have experienced. Feel free to call the clinic you have any questions or concerns. The clinic phone number is [phone].   I have been informed and understand all the instructions given to me. I know to contact the clinic, my physician, or go to the Emergency Department if any problems should occur. I do not have any questions at this time, but understand that I may call the clinic during office hours   should I have any questions or need assistance in obtaining follow up care.    __________________________________________  _____________  __________ Signature of Patient or Authorized Representative            Date                   Time    __________________________________________ Nurse's Signature

## 2012-03-26 ENCOUNTER — Other Ambulatory Visit: Payer: Self-pay | Admitting: Medical Oncology

## 2012-03-26 NOTE — Progress Notes (Signed)
BMet  cancelled per [facility] request

## 2012-03-29 ENCOUNTER — Other Ambulatory Visit: Payer: Self-pay | Admitting: Internal Medicine

## 2012-03-29 ENCOUNTER — Other Ambulatory Visit: Payer: Self-pay | Admitting: *Deleted

## 2012-03-29 ENCOUNTER — Telehealth: Payer: Self-pay | Admitting: Internal Medicine

## 2012-03-29 DIAGNOSIS — C9000 Multiple myeloma not having achieved remission: Secondary | ICD-10-CM

## 2012-03-29 NOTE — Telephone Encounter (Signed)
appts made and note placed for pt to p/u a new  sch on 8/1    aom

## 2012-03-31 ENCOUNTER — Other Ambulatory Visit: Payer: Self-pay | Admitting: *Deleted

## 2012-04-01 ENCOUNTER — Ambulatory Visit (HOSPITAL_BASED_OUTPATIENT_CLINIC_OR_DEPARTMENT_OTHER): Payer: Medicare Other | Admitting: Physician Assistant

## 2012-04-01 ENCOUNTER — Other Ambulatory Visit: Payer: Medicare Other | Admitting: Lab

## 2012-04-01 ENCOUNTER — Ambulatory Visit (HOSPITAL_BASED_OUTPATIENT_CLINIC_OR_DEPARTMENT_OTHER): Payer: Medicare Other

## 2012-04-01 ENCOUNTER — Telehealth: Payer: Self-pay | Admitting: Internal Medicine

## 2012-04-01 ENCOUNTER — Ambulatory Visit: Payer: Medicare Other | Admitting: Pharmacist

## 2012-04-01 VITALS — BP 156/74 | HR 65 | Temp 97.7°F | Ht 61.0 in | Wt 126.7 lb

## 2012-04-01 DIAGNOSIS — I82409 Acute embolism and thrombosis of unspecified deep veins of unspecified lower extremity: Secondary | ICD-10-CM

## 2012-04-01 DIAGNOSIS — Z7901 Long term (current) use of anticoagulants: Secondary | ICD-10-CM

## 2012-04-01 DIAGNOSIS — C9000 Multiple myeloma not having achieved remission: Secondary | ICD-10-CM

## 2012-04-01 DIAGNOSIS — Z5112 Encounter for antineoplastic immunotherapy: Secondary | ICD-10-CM

## 2012-04-01 LAB — CBC WITH DIFFERENTIAL/PLATELET
BASO%: 0.3 % (ref 0.0–2.0)
Basophils Absolute: 0 10*3/uL (ref 0.0–0.1)
EOS%: 1.3 % (ref 0.0–7.0)
MCH: 29.5 pg (ref 25.1–34.0)
MCHC: 33.3 g/dL (ref 31.5–36.0)
MCV: 88.6 fL (ref 79.5–101.0)
MONO%: 11 % (ref 0.0–14.0)
RBC: 4.3 10*6/uL (ref 3.70–5.45)
RDW: 14.3 % (ref 11.2–14.5)

## 2012-04-01 LAB — POCT INR: INR: 2.5

## 2012-04-01 LAB — PROTIME-INR: Protime: 30 Seconds — ABNORMAL HIGH (ref 10.6–13.4)

## 2012-04-01 MED ORDER — SODIUM CHLORIDE 0.9 % IV SOLN
Freq: Once | INTRAVENOUS | Status: AC
  Administered 2012-04-01: 14:00:00 via INTRAVENOUS

## 2012-04-01 MED ORDER — BORTEZOMIB CHEMO IV INJECTION 3.5 MG
1.3000 mg/m2 | Freq: Once | INTRAMUSCULAR | Status: AC
  Administered 2012-04-01: 2 mg via INTRAVENOUS
  Filled 2012-04-01: qty 2

## 2012-04-01 MED ORDER — ONDANSETRON 8 MG/50ML IVPB (CHCC)
8.0000 mg | Freq: Once | INTRAVENOUS | Status: AC
  Administered 2012-04-01: 8 mg via INTRAVENOUS

## 2012-04-01 NOTE — Progress Notes (Signed)
INR therapeutic today (2.5) after holding Thursday's dose. No complaints today besides that she "feels bad today."  On further questioning, this meant that her legs are sore and painful more than usual.  No accompanying swelling, heat, or redness.  Not likely r/t clotting.  Says that it sometimes burns and tingles. Since her INRs have tended to be supratherapeutic when she takes the current dose, but return therapeutic after holding a single dose - We will decrease her dose slightly today to match the cumulative weekly dose achieved when she omits a day.  Her new Coumadin dose is 5mg  daily except 2.5mg  on MWF.  Pt and niece communicated understanding.  Will recheck INR in 1 week to make sure she stays in therapeutic range.

## 2012-04-01 NOTE — Telephone Encounter (Signed)
appts made and printed for pt ,pt aware that tx will be moved from 8/29 to 8/28 and email to mw to chage

## 2012-04-01 NOTE — Patient Instructions (Signed)
[facility] Discharge Instructions for Patients Receiving Chemotherapy  Today you received the following chemotherapy agents :  Velcade  To help prevent nausea and vomiting after your treatment, we encourage you to take your nausea medication as instructed by your physician, and take meds as needed for nausea.    If you develop nausea and vomiting that is not controlled by your nausea medication, call the clinic. If it is after clinic hours your family physician or the after hours number for the clinic or go to the Emergency Department.   BELOW ARE SYMPTOMS THAT SHOULD BE REPORTED IMMEDIATELY:  *FEVER GREATER THAN 100.5 F  *CHILLS WITH OR WITHOUT FEVER  NAUSEA AND VOMITING THAT IS NOT CONTROLLED WITH YOUR NAUSEA MEDICATION  *UNUSUAL SHORTNESS OF BREATH  *UNUSUAL BRUISING OR BLEEDING  TENDERNESS IN MOUTH AND THROAT WITH OR WITHOUT PRESENCE OF ULCERS  *URINARY PROBLEMS  *BOWEL PROBLEMS  UNUSUAL RASH Items with * indicate a potential emergency and should be followed up as soon as possible.  One of the nurses will contact you 24 hours after your treatment. Please let the nurse know about any problems that you may have experienced. Feel free to call the clinic you have any questions or concerns. The clinic phone number is [phone].   I have been informed and understand all the instructions given to me. I know to contact the clinic, my physician, or go to the Emergency Department if any problems should occur. I do not have any questions at this time, but understand that I may call the clinic during office hours   should I have any questions or need assistance in obtaining follow up care.    __________________________________________  _____________  __________ Signature of Patient or Authorized Representative            Date                   Time    __________________________________________ Nurse's Signature

## 2012-04-04 NOTE — Progress Notes (Signed)
[facility] [phone]   [phone]) [phone]  OFFICE PROGRESS NOTE  [name], MD [address] [place] [address]  DIAGNOSIS:  1. multiple myeloma diagnosed in September 2009,  2. history of bilateral lower extremity deep vein thrombosis diagnosed in November 2009  3. acute on chronic deep vein thrombosis in the left lower extremity diagnosed in October 2010.   PRIOR THERAPY:  1. status post palliative radiotherapy to the right femur and is she'll tuberosity, first was done in December 2011 and the second treatment was completed Jan 08 2011, and the third treatment to the right distal femur completed on 04/30/2011  2. status post palliative radiotherapy to the left proximal femur completed 02/28/2011.  3. Revlimid 25 mg by mouth daily for 21 days every 4 weeks in addition to oral Decadron at 40 mg by mouth on a weekly basis status post 37 cycles.   CURRENT THERAPY:  1. weekly subcutaneous Velcade 1.3 mg/M2 status post 29 cycles.  2. Coumadin 5 mg on Mondays and Thursdays and 2.5 mg on all other days.  3. Zometa 4 mg IV given every 2 months.   INTERVAL HISTORY: [name] 76 y.o. female returns to the clinic today for followup visit accompanied by her daughter. The patient is doing fine and tolerating her treatment with Velcade and Decadron fairly well. Today she voices no complaints.   Her lower extremity pain is stable and well controlled on her current pain medications. She denied having night sweats, chest pain or shortness of breath.   MEDICAL HISTORY: Past Medical History  Diagnosis Date  . Blood transfusion   . Depression     Mild  . History of chicken pox   . Hyperlipidemia   . Hypertension   . Degenerative joint disease   . Diabetes mellitus     Steroid related  . Phlebitis   . Multiple myeloma     per Dr. [name], s/p palliative radiation for leg pain 2011 per Dr. Roselind Messier  . Deep vein thrombosis of bilateral lower  extremities     ALLERGIES:   has no known allergies.  MEDICATIONS:  Current Outpatient Prescriptions  Medication Sig Dispense Refill  . ALPRAZolam (XANAX) 0.5 MG tablet TAKE ONE TABLET BY MOUTH THREE TIMES DAILY AS NEEDED FOR SLEEP OR  ANXIETY  60 tablet  0  . Calcium Carbonate-Vitamin D 600-400 MG-UNIT per tablet Take 1 tablet by mouth 3 (three) times daily with meals.        Marland Kitchen dexamethasone (DECADRON) 4 MG tablet TAKE 10 TABLETS BY MOUTH ONCE PER WEEK WITH FOOD AS DIRECTED  40 tablet  3  . glipiZIDE (GLUCOTROL XL) 2.5 MG 24 hr tablet Take 1 tablet (2.5 mg total) by mouth daily.  90 tablet  2  . glucose blood (ONE TOUCH ULTRA TEST) test strip Use as instructed  25 each  6  . ibuprofen (ADVIL,MOTRIN) 200 MG tablet Take 200 mg by mouth every 6 (six) hours as needed.        Marland Kitchen KLOR-CON M20 20 MEQ tablet TAKE ONE TABLET BY MOUTH EVERY DAY  30 each  1  . Lancets (ONETOUCH ULTRASOFT) lancets Test blood sugar once daily or as directed.  100 each  11  . lisinopril-hydrochlorothiazide (PRINZIDE,ZESTORETIC) 20-12.5 MG per tablet Take 0.5 tablets by mouth daily.       . magnesium oxide (MAG-OX) 400 MG tablet Take 400 mg by mouth daily.       Marland Kitchen  oxyCODONE (OXY IR/ROXICODONE) 5 MG immediate release tablet Take 1 1/2 tabs by mouth every 6 hours as needed for pain  90 tablet  0  . oxyCODONE (OXYCONTIN) 10 MG 12 hr tablet Take 1 tablet (10 mg total) by mouth every 12 (twelve) hours.  60 tablet  0  . prochlorperazine (COMPAZINE) 10 MG tablet       . temazepam (RESTORIL) 30 MG capsule TAKE ONE CAPSULE BY MOUTH AT BEDTIME AS NEEDED FOR SLEEP  30 capsule  0  . warfarin (COUMADIN) 5 MG tablet TAKE ONE TABLET BY MOUTH EVERY DAY ON TUESDAY, THURSDAY AND SATURDAY AND TAKE 1/2 TABLET ON ALL OTHER DAYS OR AS DIRECTED  60 tablet  0   No current facility-administered medications for this visit.   Facility-Administered Medications Ordered in Other Visits  Medication Dose Route Frequency Provider Last Rate Last Dose  .  Zoledronic Acid (ZOMETA) 4 mg IVPB  4 mg Intravenous Once Si Gaul, MD        SURGICAL HISTORY:  Past Surgical History  Procedure Date  . Abdominal hysterectomy     REVIEW OF SYSTEMS:  Pertinent items are noted in HPI.   PHYSICAL EXAMINATION: General appearance: alert, cooperative and no distress Neck: no adenopathy Lymph nodes: Cervical, supraclavicular, and axillary nodes normal. Resp: clear to auscultation bilaterally Cardio: regular rate and rhythm, S1, S2 normal, no murmur, click, rub or gallop GI: soft, non-tender; bowel sounds normal; no masses,  no organomegaly Extremities: extremities normal, atraumatic, no cyanosis or edema  ECOG PERFORMANCE STATUS: 1 - Symptomatic but completely ambulatory  Blood pressure 156/74, pulse 65, temperature 97.7 F (36.5 C), temperature source Oral, height 5\' 1"  (1.549 m), weight 126 lb 11.2 oz (57.471 kg).  LABORATORY DATA: Lab Results  Component Value Date   WBC 3.9 04/01/2012   HGB 12.7 04/01/2012   HCT 38.1 04/01/2012   MCV 88.6 04/01/2012   PLT 191 04/01/2012      Chemistry      Component Value Date/Time   NA 141 04/01/2012 1308   K 3.4* 04/01/2012 1308   CL 101 04/01/2012 1308   CO2 31 04/01/2012 1308   BUN 13 04/01/2012 1308   CREATININE 0.87 04/01/2012 1308      Component Value Date/Time   CALCIUM 9.6 04/01/2012 1308   CALCIUM  Value: 5.7 CRITICAL RESULT CALLED TO, READ BACK BY AND VERIFIED WITH: JAMIE TRACY,RN [id number] @ 1433 BY J SCOTTON (NOTE)  Amended report. Result repeated and verified. CORRECTED ON 10/14 AT 1429: PREVIOUSLY REPORTED AS Result repeated and verified.* 06/13/2008 1605   ALKPHOS 47 04/01/2012 1308   AST 21 04/01/2012 1308   ALT 20 04/01/2012 1308   BILITOT 0.3 04/01/2012 1308       RADIOGRAPHIC STUDIES: Mm Digital Screening  12/23/2011  DG SCREEN MAMMOGRAM BILATERAL Bilateral CC and MLO view(s) were taken.  DIGITAL SCREENING MAMMOGRAM WITH CAD: There are scattered fibroglandular densities.  No masses or malignant type  calcifications are  identified.  Compared with prior studies.  Images were processed with CAD.  IMPRESSION: No specific mammographic evidence of malignancy.  Next screening mammogram is recommended in one  year.  A result letter of this screening mammogram will be mailed directly to the patient.  ASSESSMENT: Negative - BI-RADS 1  Screening mammogram in 1 year. ,    ASSESSMENT/PLAN: This is a very pleasant 76 years old [place] American female with multiple myeloma currently on treatment with subcutaneous Velcade and weekly Decadron. The patient is tolerating her  treatment fairly well with no significant complaints and she has no evidence for disease progression on her last myeloma panel.The patient was discussed with Dr. [name]. She will continue on her current treatment. She will  due for her Zometa infusion on March 25, 2012. Her coumadin therapy continues to be managed by the [facility]. She had her protein studies drawn today to followup her disease and will followup with Dr. [name] in one week to discuss the results.   Brylinn Teaney E, PA-C   She was advised to call immediately if she has any concerning symptoms in the interval.  All questions were answered. The patient knows to call the clinic with any problems, questions or concerns. We can certainly see the patient much sooner if necessary.

## 2012-04-05 ENCOUNTER — Encounter: Payer: Self-pay | Admitting: Internal Medicine

## 2012-04-05 LAB — COMPREHENSIVE METABOLIC PANEL
ALT: 20 U/L (ref 0–35)
AST: 21 U/L (ref 0–37)
Alkaline Phosphatase: 47 U/L (ref 39–117)
BUN: 13 mg/dL (ref 6–23)
Creatinine, Ser: 0.87 mg/dL (ref 0.50–1.10)
Potassium: 3.4 mEq/L — ABNORMAL LOW (ref 3.5–5.3)

## 2012-04-05 LAB — SPEP & IFE WITH QIG
Beta 2: 4.8 % (ref 3.2–6.5)
Beta Globulin: 6.5 % (ref 4.7–7.2)
Gamma Globulin: 8.8 % — ABNORMAL LOW (ref 11.1–18.8)
IgA: 108 mg/dL (ref 69–380)
IgG (Immunoglobin G), Serum: 547 mg/dL — ABNORMAL LOW (ref 690–1700)
IgM, Serum: 120 mg/dL (ref 52–322)

## 2012-04-05 LAB — KAPPA/LAMBDA LIGHT CHAINS
Kappa free light chain: 0.2 mg/dL — ABNORMAL LOW (ref 0.33–1.94)
Lambda Free Lght Chn: 4.31 mg/dL — ABNORMAL HIGH (ref 0.57–2.63)

## 2012-04-05 NOTE — Progress Notes (Signed)
Put daughter's fmla paper on nurse's desk °

## 2012-04-07 ENCOUNTER — Telehealth: Payer: Self-pay | Admitting: *Deleted

## 2012-04-07 ENCOUNTER — Other Ambulatory Visit (HOSPITAL_BASED_OUTPATIENT_CLINIC_OR_DEPARTMENT_OTHER): Payer: Medicare Other | Admitting: Lab

## 2012-04-07 ENCOUNTER — Ambulatory Visit: Payer: Medicare Other | Admitting: Pharmacist

## 2012-04-07 ENCOUNTER — Ambulatory Visit (HOSPITAL_BASED_OUTPATIENT_CLINIC_OR_DEPARTMENT_OTHER): Payer: Medicare Other | Admitting: Internal Medicine

## 2012-04-07 VITALS — BP 150/83 | HR 65 | Temp 97.9°F | Resp 18 | Ht 61.0 in | Wt 127.3 lb

## 2012-04-07 DIAGNOSIS — I82409 Acute embolism and thrombosis of unspecified deep veins of unspecified lower extremity: Secondary | ICD-10-CM

## 2012-04-07 DIAGNOSIS — C9000 Multiple myeloma not having achieved remission: Secondary | ICD-10-CM

## 2012-04-07 DIAGNOSIS — Z7901 Long term (current) use of anticoagulants: Secondary | ICD-10-CM

## 2012-04-07 DIAGNOSIS — Z5181 Encounter for therapeutic drug level monitoring: Secondary | ICD-10-CM

## 2012-04-07 LAB — COMPREHENSIVE METABOLIC PANEL
ALT: 12 U/L (ref 0–35)
BUN: 16 mg/dL (ref 6–23)
CO2: 32 mEq/L (ref 19–32)
Creatinine, Ser: 0.97 mg/dL (ref 0.50–1.10)
Total Bilirubin: 0.4 mg/dL (ref 0.3–1.2)

## 2012-04-07 LAB — POCT INR: INR: 2.3

## 2012-04-07 LAB — CBC WITH DIFFERENTIAL/PLATELET
BASO%: 0.3 % (ref 0.0–2.0)
Basophils Absolute: 0 10*3/uL (ref 0.0–0.1)
EOS%: 1.1 % (ref 0.0–7.0)
HCT: 38.5 % (ref 34.8–46.6)
LYMPH%: 11.6 % — ABNORMAL LOW (ref 14.0–49.7)
MCH: 29.9 pg (ref 25.1–34.0)
MCHC: 32.7 g/dL (ref 31.5–36.0)
NEUT%: 74.9 % (ref 38.4–76.8)
Platelets: 173 10*3/uL (ref 145–400)

## 2012-04-07 LAB — PROTIME-INR: Protime: 27.6 Seconds — ABNORMAL HIGH (ref 10.6–13.4)

## 2012-04-07 NOTE — Progress Notes (Signed)
[facility] [phone]   [phone]) [phone]  OFFICE PROGRESS NOTE  [name], MD [address] [place] [address]  DIAGNOSIS:  1. multiple myeloma diagnosed in September 2009,  2. history of bilateral lower extremity deep vein thrombosis diagnosed in November 2009  3. acute on chronic deep vein thrombosis in the left lower extremity diagnosed in October 2010.  PRIOR THERAPY:  1. status post palliative radiotherapy to the right femur and is she'll tuberosity, first was done in December 2011 and the second treatment was completed Jan 08 2011, and the third treatment to the right distal femur completed on 04/30/2011  2. status post palliative radiotherapy to the left proximal femur completed 02/28/2011.  3. Revlimid 25 mg by mouth daily for 21 days every 4 weeks in addition to oral Decadron at 40 mg by mouth on a weekly basis status post 37 cycles.  CURRENT THERAPY:  1. weekly subcutaneous Velcade 1.3 mg/M2 status post 30 cycles.  2. Coumadin 5 mg on Mondays and Thursdays and 2.5 mg on all other days.  3. Zometa 4 mg IV given every 2 months.  INTERVAL HISTORY: [name] 76 y.o. female returns to the clinic today for followup visit accompanied her 2 daughters. The patient is feeling fine except for occasional back pain as well as right neck pain. She is currently on pain medication the form of OxyContin 10 mg by mouth every 12 hours in addition to oxycodone for breakthrough pain with good pain control. The patient is tolerating her treatment with subcutaneous weekly Velcade fairly well. She has no significant adverse effects. He denied having any significant peripheral neuropathy, no weight loss or night sweats. She has no chest pain or shortness of breath. The patient has repeat CBC, comprehensive metabolic panel, LDH and myeloma panel performed last week and she is here for evaluation and discussion of her lab results.  MEDICAL HISTORY: Past  Medical History  Diagnosis Date  . Blood transfusion   . Depression     Mild  . History of chicken pox   . Hyperlipidemia   . Hypertension   . Degenerative joint disease   . Diabetes mellitus     Steroid related  . Phlebitis   . Multiple myeloma     per Dr. [name], s/p palliative radiation for leg pain 2011 per Dr. Roselind Messier  . Deep vein thrombosis of bilateral lower extremities     ALLERGIES:   has no known allergies.  MEDICATIONS:  Current Outpatient Prescriptions  Medication Sig Dispense Refill  . ALPRAZolam (XANAX) 0.5 MG tablet TAKE ONE TABLET BY MOUTH THREE TIMES DAILY AS NEEDED FOR SLEEP OR  ANXIETY  60 tablet  0  . Calcium Carbonate-Vitamin D 600-400 MG-UNIT per tablet Take 1 tablet by mouth 3 (three) times daily with meals.        Marland Kitchen dexamethasone (DECADRON) 4 MG tablet TAKE 10 TABLETS BY MOUTH ONCE PER WEEK WITH FOOD AS DIRECTED  40 tablet  3  . glipiZIDE (GLUCOTROL XL) 2.5 MG 24 hr tablet Take 1 tablet (2.5 mg total) by mouth daily.  90 tablet  2  . glucose blood (ONE TOUCH ULTRA TEST) test strip Use as instructed  25 each  6  . ibuprofen (ADVIL,MOTRIN) 200 MG tablet Take 200 mg by mouth every 6 (six) hours as needed.        Marland Kitchen KLOR-CON M20 20 MEQ tablet TAKE ONE TABLET BY MOUTH EVERY DAY  30  each  1  . Lancets (ONETOUCH ULTRASOFT) lancets Test blood sugar once daily or as directed.  100 each  11  . lisinopril-hydrochlorothiazide (PRINZIDE,ZESTORETIC) 20-12.5 MG per tablet Take 0.5 tablets by mouth daily.       . magnesium oxide (MAG-OX) 400 MG tablet Take 400 mg by mouth daily.       Marland Kitchen oxyCODONE (OXY IR/ROXICODONE) 5 MG immediate release tablet Take 1 1/2 tabs by mouth every 6 hours as needed for pain  90 tablet  0  . oxyCODONE (OXYCONTIN) 10 MG 12 hr tablet Take 1 tablet (10 mg total) by mouth every 12 (twelve) hours.  60 tablet  0  . prochlorperazine (COMPAZINE) 10 MG tablet       . temazepam (RESTORIL) 30 MG capsule TAKE ONE CAPSULE BY MOUTH AT BEDTIME AS NEEDED FOR  SLEEP  30 capsule  0  . warfarin (COUMADIN) 5 MG tablet TAKE ONE TABLET BY MOUTH EVERY DAY ON TUESDAY, THURSDAY AND SATURDAY AND TAKE 1/2 TABLET ON ALL OTHER DAYS OR AS DIRECTED  60 tablet  0   No current facility-administered medications for this visit.   Facility-Administered Medications Ordered in Other Visits  Medication Dose Route Frequency Provider Last Rate Last Dose  . Zoledronic Acid (ZOMETA) 4 mg IVPB  4 mg Intravenous Once Si Gaul, MD        SURGICAL HISTORY:  Past Surgical History  Procedure Date  . Abdominal hysterectomy     REVIEW OF SYSTEMS:  A comprehensive review of systems was negative except for: Musculoskeletal: positive for arthralgias and bone pain   PHYSICAL EXAMINATION: General appearance: alert, cooperative and no distress Head: Normocephalic, without obvious abnormality, atraumatic Neck: no adenopathy Lymph nodes: Cervical, supraclavicular, and axillary nodes normal. Resp: clear to auscultation bilaterally Cardio: regular rate and rhythm, S1, S2 normal, no murmur, click, rub or gallop GI: soft, non-tender; bowel sounds normal; no masses,  no organomegaly Extremities: extremities normal, atraumatic, no cyanosis or edema  ECOG PERFORMANCE STATUS: 1 - Symptomatic but completely ambulatory  Blood pressure 150/83, pulse 65, temperature 97.9 F (36.6 C), temperature source Oral, resp. rate 18, height 5\' 1"  (1.549 m), weight 127 lb 4.8 oz (57.743 kg).  LABORATORY DATA: Lab Results  Component Value Date   WBC 3.8* 04/07/2012   HGB 12.6 04/07/2012   HCT 38.5 04/07/2012   MCV 91.5 04/07/2012   PLT 173 04/07/2012      Chemistry      Component Value Date/Time   NA 141 04/01/2012 1308   K 3.4* 04/01/2012 1308   CL 101 04/01/2012 1308   CO2 31 04/01/2012 1308   BUN 13 04/01/2012 1308   CREATININE 0.87 04/01/2012 1308      Component Value Date/Time   CALCIUM 9.6 04/01/2012 1308   CALCIUM  Value: 5.7 CRITICAL RESULT CALLED TO, READ BACK BY AND VERIFIED WITH: JAMIE  TRACY,RN [id number] @ 1433 BY J SCOTTON (NOTE)  Amended report. Result repeated and verified. CORRECTED ON 10/14 AT 1429: PREVIOUSLY REPORTED AS Result repeated and verified.* 06/13/2008 1605   ALKPHOS 47 04/01/2012 1308   AST 21 04/01/2012 1308   ALT 20 04/01/2012 1308   BILITOT 0.3 04/01/2012 1308       RADIOGRAPHIC STUDIES: No results found.  ASSESSMENT: This is a very pleasant 76 years old [place] American female with history of multiple myeloma currently on treatment with subcutaneous weekly Velcade and tolerating it fairly well. She has no evidence for disease progression on the recent myeloma panel.  PLAN: I discussed the lab result with the patient and her daughters. I recommended for her to continue with the same treatment regimen for now. She would come back for followup visit in one month's for reevaluation and management any adverse effect of her treatment. For pain management the patient will continue on the current pain medication. She was advised to call me immediately if she has any concerning symptoms in the interval. All questions were answered. The patient knows to call the clinic with any problems, questions or concerns. We can certainly see the patient much sooner if necessary.  I spent 15 minutes counseling the patient face to face. The total time spent in the appointment was 25 minutes.

## 2012-04-07 NOTE — Progress Notes (Signed)
Continue 5mg daily except 2.5mg on Mondays, Wednesdays and Fridays.  Recheck INR on 04/15/12.  A pharmacist will come see you in the infusion area. 

## 2012-04-07 NOTE — Patient Instructions (Signed)
Continue 5mg  daily except 2.5mg  on Mondays, Wednesdays and Fridays.  Recheck INR on 04/15/12.  A pharmacist will come see you in the infusion area.

## 2012-04-07 NOTE — Telephone Encounter (Signed)
Gave patient appointment for 05-06-2012 starting at 1:45pm for lab and midlevel and velvade injection printed out calendar and gave to the patient

## 2012-04-08 ENCOUNTER — Other Ambulatory Visit: Payer: Medicare Other | Admitting: Lab

## 2012-04-08 ENCOUNTER — Ambulatory Visit: Payer: Medicare Other

## 2012-04-08 ENCOUNTER — Ambulatory Visit (HOSPITAL_BASED_OUTPATIENT_CLINIC_OR_DEPARTMENT_OTHER): Payer: Medicare Other

## 2012-04-08 VITALS — BP 130/71 | HR 73 | Temp 97.3°F | Resp 18

## 2012-04-08 DIAGNOSIS — Z5112 Encounter for antineoplastic immunotherapy: Secondary | ICD-10-CM

## 2012-04-08 DIAGNOSIS — C9000 Multiple myeloma not having achieved remission: Secondary | ICD-10-CM

## 2012-04-08 MED ORDER — BORTEZOMIB CHEMO IV INJECTION 3.5 MG
1.3000 mg/m2 | Freq: Once | INTRAMUSCULAR | Status: AC
  Administered 2012-04-08: 2 mg via INTRAVENOUS
  Filled 2012-04-08: qty 2

## 2012-04-08 MED ORDER — ONDANSETRON 8 MG/50ML IVPB (CHCC)
8.0000 mg | Freq: Once | INTRAVENOUS | Status: AC
  Administered 2012-04-08: 8 mg via INTRAVENOUS

## 2012-04-08 MED ORDER — SODIUM CHLORIDE 0.9 % IV SOLN
Freq: Once | INTRAVENOUS | Status: AC
  Administered 2012-04-08: 14:00:00 via INTRAVENOUS

## 2012-04-08 NOTE — Patient Instructions (Signed)
[facility] Discharge Instructions for Patients Receiving Chemotherapy  Today you received the following chemotherapy agents Velcade  To help prevent nausea and vomiting after your treatment, we encourage you to take your nausea medication  Begin taking it at 7 pm and take it as often as prescribed for the next 24 to 72 hours.   If you develop nausea and vomiting that is not controlled by your nausea medication, call the clinic. If it is after clinic hours your family physician or the after hours number for the clinic or go to the Emergency Department.   BELOW ARE SYMPTOMS THAT SHOULD BE REPORTED IMMEDIATELY:  *FEVER GREATER THAN 100.5 F  *CHILLS WITH OR WITHOUT FEVER  NAUSEA AND VOMITING THAT IS NOT CONTROLLED WITH YOUR NAUSEA MEDICATION  *UNUSUAL SHORTNESS OF BREATH  *UNUSUAL BRUISING OR BLEEDING  TENDERNESS IN MOUTH AND THROAT WITH OR WITHOUT PRESENCE OF ULCERS  *URINARY PROBLEMS  *BOWEL PROBLEMS  UNUSUAL RASH Items with * indicate a potential emergency and should be followed up as soon as possible.  One of the nurses will contact you 24 hours after your treatment. Please let the nurse know about any problems that you may have experienced. Feel free to call the clinic you have any questions or concerns. The clinic phone number is [phone].   I have been informed and understand all the instructions given to me. I know to contact the clinic, my physician, or go to the Emergency Department if any problems should occur. I do not have any questions at this time, but understand that I may call the clinic during office hours   should I have any questions or need assistance in obtaining follow up care.    __________________________________________  _____________  __________ Signature of Patient or Authorized Representative            Date                   Time    __________________________________________ Nurse's Signature    

## 2012-04-15 ENCOUNTER — Ambulatory Visit: Payer: Medicare Other | Admitting: Pharmacist

## 2012-04-15 ENCOUNTER — Ambulatory Visit (HOSPITAL_BASED_OUTPATIENT_CLINIC_OR_DEPARTMENT_OTHER): Payer: Medicare Other

## 2012-04-15 ENCOUNTER — Other Ambulatory Visit (HOSPITAL_BASED_OUTPATIENT_CLINIC_OR_DEPARTMENT_OTHER): Payer: Medicare Other | Admitting: Lab

## 2012-04-15 ENCOUNTER — Other Ambulatory Visit: Payer: Self-pay | Admitting: *Deleted

## 2012-04-15 DIAGNOSIS — I82409 Acute embolism and thrombosis of unspecified deep veins of unspecified lower extremity: Secondary | ICD-10-CM

## 2012-04-15 DIAGNOSIS — C9000 Multiple myeloma not having achieved remission: Secondary | ICD-10-CM

## 2012-04-15 DIAGNOSIS — Z5112 Encounter for antineoplastic immunotherapy: Secondary | ICD-10-CM

## 2012-04-15 LAB — COMPREHENSIVE METABOLIC PANEL
ALT: 13 U/L (ref 0–35)
AST: 18 U/L (ref 0–37)
Albumin: 3.6 g/dL (ref 3.5–5.2)
Alkaline Phosphatase: 45 U/L (ref 39–117)
Calcium: 9.2 mg/dL (ref 8.4–10.5)
Chloride: 104 mEq/L (ref 96–112)
Creatinine, Ser: 0.92 mg/dL (ref 0.50–1.10)
Potassium: 3.5 mEq/L (ref 3.5–5.3)

## 2012-04-15 LAB — CBC WITH DIFFERENTIAL/PLATELET
BASO%: 0 % (ref 0.0–2.0)
EOS%: 0.9 % (ref 0.0–7.0)
MCH: 29.7 pg (ref 25.1–34.0)
MCHC: 32.2 g/dL (ref 31.5–36.0)
MONO%: 9.6 % (ref 0.0–14.0)
RDW: 14.9 % — ABNORMAL HIGH (ref 11.2–14.5)
lymph#: 0.3 10*3/uL — ABNORMAL LOW (ref 0.9–3.3)

## 2012-04-15 LAB — PROTIME-INR

## 2012-04-15 MED ORDER — OXYCODONE HCL 5 MG PO TABS: ORAL_TABLET | ORAL | Status: DC

## 2012-04-15 MED ORDER — OXYCODONE HCL 10 MG PO TB12: 10.0000 mg | ORAL_TABLET | Freq: Two times a day (BID) | ORAL | Status: DC

## 2012-04-15 MED ORDER — SODIUM CHLORIDE 0.9 % IV SOLN
Freq: Once | INTRAVENOUS | Status: AC
  Administered 2012-04-15: 14:00:00 via INTRAVENOUS

## 2012-04-15 MED ORDER — ONDANSETRON 8 MG/50ML IVPB (CHCC)
8.0000 mg | Freq: Once | INTRAVENOUS | Status: AC
  Administered 2012-04-15: 8 mg via INTRAVENOUS

## 2012-04-15 MED ORDER — BORTEZOMIB CHEMO IV INJECTION 3.5 MG
1.3000 mg/m2 | Freq: Once | INTRAMUSCULAR | Status: AC
  Administered 2012-04-15: 2 mg via INTRAVENOUS
  Filled 2012-04-15: qty 2

## 2012-04-15 NOTE — Progress Notes (Signed)
INR therapeutic today (2.9) No changes in meds or diet.  No complaints besides joint pain in lower legs. Continue current dose of 5mg  daily except 2.5mg  on MWF.   Recheck INR in 1 week to ensure that pt stays in therapeutic range.  She has a history of having her INR creep supratherapeutic.

## 2012-04-15 NOTE — Patient Instructions (Signed)
Pt will call with any problems 

## 2012-04-20 ENCOUNTER — Other Ambulatory Visit: Payer: Self-pay | Admitting: Internal Medicine

## 2012-04-21 ENCOUNTER — Other Ambulatory Visit: Payer: Self-pay | Admitting: Internal Medicine

## 2012-04-22 ENCOUNTER — Ambulatory Visit: Payer: Medicare Other | Admitting: Pharmacist

## 2012-04-22 ENCOUNTER — Other Ambulatory Visit (HOSPITAL_BASED_OUTPATIENT_CLINIC_OR_DEPARTMENT_OTHER): Payer: Medicare Other | Admitting: Lab

## 2012-04-22 ENCOUNTER — Other Ambulatory Visit: Payer: Self-pay | Admitting: Internal Medicine

## 2012-04-22 ENCOUNTER — Ambulatory Visit (HOSPITAL_BASED_OUTPATIENT_CLINIC_OR_DEPARTMENT_OTHER): Payer: Medicare Other

## 2012-04-22 VITALS — BP 133/59 | HR 54 | Temp 98.5°F | Resp 20

## 2012-04-22 DIAGNOSIS — C9000 Multiple myeloma not having achieved remission: Secondary | ICD-10-CM

## 2012-04-22 DIAGNOSIS — Z7901 Long term (current) use of anticoagulants: Secondary | ICD-10-CM

## 2012-04-22 DIAGNOSIS — Z5181 Encounter for therapeutic drug level monitoring: Secondary | ICD-10-CM

## 2012-04-22 DIAGNOSIS — I82509 Chronic embolism and thrombosis of unspecified deep veins of unspecified lower extremity: Secondary | ICD-10-CM

## 2012-04-22 DIAGNOSIS — I82409 Acute embolism and thrombosis of unspecified deep veins of unspecified lower extremity: Secondary | ICD-10-CM

## 2012-04-22 DIAGNOSIS — Z5112 Encounter for antineoplastic immunotherapy: Secondary | ICD-10-CM

## 2012-04-22 LAB — COMPREHENSIVE METABOLIC PANEL
ALT: 14 U/L (ref 0–35)
AST: 18 U/L (ref 0–37)
Calcium: 9.9 mg/dL (ref 8.4–10.5)
Chloride: 102 mEq/L (ref 96–112)
Creatinine, Ser: 1.02 mg/dL (ref 0.50–1.10)
Sodium: 141 mEq/L (ref 135–145)
Total Bilirubin: 0.3 mg/dL (ref 0.3–1.2)
Total Protein: 5.9 g/dL — ABNORMAL LOW (ref 6.0–8.3)

## 2012-04-22 LAB — CBC WITH DIFFERENTIAL/PLATELET
BASO%: 0.2 % (ref 0.0–2.0)
EOS%: 1.8 % (ref 0.0–7.0)
HCT: 35.8 % (ref 34.8–46.6)
MCH: 30.2 pg (ref 25.1–34.0)
MCHC: 33.2 g/dL (ref 31.5–36.0)
NEUT%: 71.4 % (ref 38.4–76.8)
RBC: 3.93 10*6/uL (ref 3.70–5.45)
WBC: 3.8 10*3/uL — ABNORMAL LOW (ref 3.9–10.3)
lymph#: 0.5 10*3/uL — ABNORMAL LOW (ref 0.9–3.3)

## 2012-04-22 LAB — POCT INR: INR: 2.9

## 2012-04-22 LAB — PROTIME-INR

## 2012-04-22 MED ORDER — ONDANSETRON 8 MG/50ML IVPB (CHCC)
8.0000 mg | Freq: Once | INTRAVENOUS | Status: AC
  Administered 2012-04-22: 8 mg via INTRAVENOUS

## 2012-04-22 MED ORDER — SODIUM CHLORIDE 0.9 % IV SOLN
Freq: Once | INTRAVENOUS | Status: AC
  Administered 2012-04-22: 15:00:00 via INTRAVENOUS

## 2012-04-22 MED ORDER — BORTEZOMIB CHEMO IV INJECTION 3.5 MG
1.3000 mg/m2 | Freq: Once | INTRAMUSCULAR | Status: AC
  Administered 2012-04-22: 2 mg via INTRAVENOUS
  Filled 2012-04-22: qty 2

## 2012-04-22 NOTE — Patient Instructions (Signed)
Pt is therapeutic today at 2.9.  She will maintain her current dose of 2.5 mg on MWF and 5 mg daily other days.  We will see her in clinic in 2 weeks when she meets with AJ.  

## 2012-04-22 NOTE — Patient Instructions (Signed)
[facility] Discharge Instructions for Patients Receiving Chemotherapy  Today you received the following chemotherapy agents Velcade.  To help prevent nausea and vomiting after your treatment, we encourage you to take your nausea medication as prescribed.   If you develop nausea and vomiting that is not controlled by your nausea medication, call the clinic. If it is after clinic hours your family physician or the after hours number for the clinic or go to the Emergency Department.   BELOW ARE SYMPTOMS THAT SHOULD BE REPORTED IMMEDIATELY:  *FEVER GREATER THAN 100.5 F  *CHILLS WITH OR WITHOUT FEVER  NAUSEA AND VOMITING THAT IS NOT CONTROLLED WITH YOUR NAUSEA MEDICATION  *UNUSUAL SHORTNESS OF BREATH  *UNUSUAL BRUISING OR BLEEDING  TENDERNESS IN MOUTH AND THROAT WITH OR WITHOUT PRESENCE OF ULCERS  *URINARY PROBLEMS  *BOWEL PROBLEMS  UNUSUAL RASH Items with * indicate a potential emergency and should be followed up as soon as possible.  One of the nurses will contact you 24 hours after your treatment. Please let the nurse know about any problems that you may have experienced. Feel free to call the clinic you have any questions or concerns. The clinic phone number is [phone].   I have been informed and understand all the instructions given to me. I know to contact the clinic, my physician, or go to the Emergency Department if any problems should occur. I do not have any questions at this time, but understand that I may call the clinic during office hours   should I have any questions or need assistance in obtaining follow up care.    __________________________________________  _____________  __________ Signature of Patient or Authorized Representative            Date                   Time    __________________________________________ Nurse's Signature    

## 2012-04-22 NOTE — Progress Notes (Signed)
Pt is therapeutic today at 2.9.  She will maintain her current dose of 2.5 mg on MWF and 5 mg daily other days.  We will see her in clinic in 2 weeks when she meets with AJ.

## 2012-04-23 ENCOUNTER — Telehealth: Payer: Self-pay

## 2012-04-23 ENCOUNTER — Telehealth: Payer: Self-pay | Admitting: *Deleted

## 2012-04-23 NOTE — Telephone Encounter (Signed)
I called and talked to Selena Batten and the patient.  The pain is not acutely worse, been worse for a few months.  No fevers.  Able to weight bear.  The pain is worse intermittently.  I would take an 0.5 tab of oxycodone daily as needed.  I wouldn't change the oxycontin.  If not improved, they'll let me know.   I got cut off from Kim her daughter, please call her and make sure she is clear on this.

## 2012-04-23 NOTE — Telephone Encounter (Signed)
[name] advised and understands instructions.

## 2012-04-23 NOTE — Telephone Encounter (Signed)
Patient's daughter called with c/o that patient is experiencing chronic right knee pain not totally relieved by pain regimen.  It was confirmed that patient is taking Oxycontin and Oxycodone IR for breakthrough pain.  Patient's daughter has also contacted PCP and is awaiting a call back.  Patient is tolerating chemotherapeutic agents with good tolerance.  Daughter wanted to inquire about steroid injections for the knee.  This RN suggested that patient be seen by PCP for c/o and inquiry about what additional treatments can be done to help with knee; if there was uncertaintly, PCP can contact this office.

## 2012-04-23 NOTE — Telephone Encounter (Signed)
[name] pts daughter request pain med but pt does not want to add another pill to take by mouth.Pain level now 8. Pt was to have knee surgery 4 years ago but due to inoperable multimyeloma pt has not had surgery.  Pt taking oxycontin 10 mg every12 hr and oxycontin 5 mg for break thru pain every 6 hous(pt taking oxycontin at max dose). [name].Please advise. [name] request call back.

## 2012-04-26 ENCOUNTER — Other Ambulatory Visit: Payer: Self-pay | Admitting: Physician Assistant

## 2012-04-26 DIAGNOSIS — I82409 Acute embolism and thrombosis of unspecified deep veins of unspecified lower extremity: Secondary | ICD-10-CM

## 2012-04-27 ENCOUNTER — Other Ambulatory Visit: Payer: Self-pay | Admitting: *Deleted

## 2012-04-27 DIAGNOSIS — C9000 Multiple myeloma not having achieved remission: Secondary | ICD-10-CM

## 2012-04-27 MED ORDER — ALPRAZOLAM 0.5 MG PO TABS: ORAL_TABLET | ORAL | Status: DC

## 2012-04-28 ENCOUNTER — Ambulatory Visit (HOSPITAL_BASED_OUTPATIENT_CLINIC_OR_DEPARTMENT_OTHER): Payer: Medicare Other | Admitting: Lab

## 2012-04-28 ENCOUNTER — Ambulatory Visit (HOSPITAL_BASED_OUTPATIENT_CLINIC_OR_DEPARTMENT_OTHER): Payer: Medicare Other

## 2012-04-28 DIAGNOSIS — C9000 Multiple myeloma not having achieved remission: Secondary | ICD-10-CM

## 2012-04-28 DIAGNOSIS — Z5112 Encounter for antineoplastic immunotherapy: Secondary | ICD-10-CM

## 2012-04-28 LAB — COMPREHENSIVE METABOLIC PANEL (CC13)
AST: 20 U/L (ref 5–34)
BUN: 13 mg/dL (ref 7.0–26.0)
Creatinine: 0.9 mg/dL (ref 0.6–1.1)
Total Bilirubin: 0.4 mg/dL (ref 0.20–1.20)
Total Protein: 6 g/dL — ABNORMAL LOW (ref 6.4–8.3)

## 2012-04-28 LAB — CBC WITH DIFFERENTIAL/PLATELET
BASO%: 0 % (ref 0.0–2.0)
EOS%: 1 % (ref 0.0–7.0)
HCT: 37.5 % (ref 34.8–46.6)
LYMPH%: 5.3 % — ABNORMAL LOW (ref 14.0–49.7)
MCH: 29.8 pg (ref 25.1–34.0)
MCHC: 33.3 g/dL (ref 31.5–36.0)
MCV: 89.3 fL (ref 79.5–101.0)
MONO%: 15.4 % — ABNORMAL HIGH (ref 0.0–14.0)
NEUT%: 78.3 % — ABNORMAL HIGH (ref 38.4–76.8)
Platelets: 152 10*3/uL (ref 145–400)

## 2012-04-28 MED ORDER — SODIUM CHLORIDE 0.9 % IV SOLN
Freq: Once | INTRAVENOUS | Status: AC
  Administered 2012-04-28: 14:00:00 via INTRAVENOUS

## 2012-04-28 MED ORDER — ONDANSETRON 8 MG/50ML IVPB (CHCC)
8.0000 mg | Freq: Once | INTRAVENOUS | Status: AC
  Administered 2012-04-28: 8 mg via INTRAVENOUS

## 2012-04-28 MED ORDER — SODIUM CHLORIDE 0.9 % IJ SOLN
10.0000 mL | INTRAMUSCULAR | Status: DC | PRN
  Filled 2012-04-28: qty 10

## 2012-04-28 MED ORDER — BORTEZOMIB CHEMO IV INJECTION 3.5 MG
1.3000 mg/m2 | Freq: Once | INTRAMUSCULAR | Status: AC
  Administered 2012-04-28: 2 mg via INTRAVENOUS
  Filled 2012-04-28: qty 2

## 2012-04-28 MED ORDER — HEPARIN SOD (PORK) LOCK FLUSH 100 UNIT/ML IV SOLN
500.0000 [IU] | Freq: Once | INTRAVENOUS | Status: DC | PRN
  Filled 2012-04-28: qty 5

## 2012-04-28 NOTE — Patient Instructions (Signed)
[facility] Discharge Instructions for Patients Receiving Chemotherapy  Today you received the following chemotherapy agents Velcade  To help prevent nausea and vomiting after your treatment, we encourage you to take your nausea medication as directed.   If you develop nausea and vomiting that is not controlled by your nausea medication, call the clinic. If it is after clinic hours your family physician or the after hours number for the clinic or go to the Emergency Department.   BELOW ARE SYMPTOMS THAT SHOULD BE REPORTED IMMEDIATELY:  *FEVER GREATER THAN 100.5 F  *CHILLS WITH OR WITHOUT FEVER  NAUSEA AND VOMITING THAT IS NOT CONTROLLED WITH YOUR NAUSEA MEDICATION  *UNUSUAL SHORTNESS OF BREATH  *UNUSUAL BRUISING OR BLEEDING  TENDERNESS IN MOUTH AND THROAT WITH OR WITHOUT PRESENCE OF ULCERS  *URINARY PROBLEMS  *BOWEL PROBLEMS  UNUSUAL RASH Items with * indicate a potential emergency and should be followed up as soon as possible.  One of the nurses will contact you 24 hours after your treatment. Please let the nurse know about any problems that you may have experienced. Feel free to call the clinic you have any questions or concerns. The clinic phone number is [phone].   I have been informed and understand all the instructions given to me. I know to contact the clinic, my physician, or go to the Emergency Department if any problems should occur. I do not have any questions at this time, but understand that I may call the clinic during office hours   should I have any questions or need assistance in obtaining follow up care.    __________________________________________  _____________  __________ Signature of Patient or Authorized Representative            Date                   Time    __________________________________________ Nurse's Signature    

## 2012-04-29 ENCOUNTER — Ambulatory Visit: Payer: Medicare Other

## 2012-04-29 ENCOUNTER — Other Ambulatory Visit: Payer: Medicare Other | Admitting: Lab

## 2012-04-30 ENCOUNTER — Telehealth: Payer: Self-pay | Admitting: *Deleted

## 2012-04-30 DIAGNOSIS — B029 Zoster without complications: Secondary | ICD-10-CM

## 2012-04-30 MED ORDER — VALACYCLOVIR HCL 1 G PO TABS: 1000.0000 mg | ORAL_TABLET | Freq: Three times a day (TID) | ORAL | Status: DC

## 2012-04-30 MED ORDER — TRIAMCINOLONE ACETONIDE 0.1 % EX LOTN: TOPICAL_LOTION | Freq: Two times a day (BID) | CUTANEOUS | Status: DC

## 2012-04-30 NOTE — Telephone Encounter (Signed)
Pt's daughter called stating that about 1-2 days ago pt has a rash on her left lower back and some little bumps.  It is also on her left thigh.  Pt brought into clinic for Adrena to assess.  Per Adrena, pt has shingles.  Educational information given to pt's daughter Selena Batten regarding shingles as well as the names of the rx's that were called in (valtrex 1gm TID x 7 days and triamcinolone cream 0.1% apply BID to affected areas).  Pt is going to the beach this weekend, per Adrena, it is okay for her to go to the beach just be careful in the sun, try to stay in the shade and wear sunscreen. SLJ

## 2012-04-30 NOTE — Patient Instructions (Signed)
Shingles  Shingles is caused by the same virus that causes chickenpox (varicella zoster virus or VZV). Shingles often occurs many years or decades after having chickenpox. That is why it is more common in adults older than 50 years. The virus reactivates and breaks out as an infection in a nerve root. SYMPTOMS   The initial feeling (sensations) may be pain. This pain is usually described as:   Burning.   Stabbing.   Throbbing.   Tingling in the nerve root.   A red rash will follow in a couple days. The rash may occur in any area of the body and is usually on one side (unilateral) of the body in a band or belt-like pattern. The rash usually starts out as very small blisters (vesicles). They will dry up after 7 to 10 days. This is not usually a significant problem except for the pain it causes.   Long-lasting (chronic) pain is more likely in an elderly person. It can last months to years. This condition is called postherpetic neuralgia.  Shingles can be an extremely severe infection in someone with AIDS, a weakened immune system, or with forms of leukemia. It can also be severe if you are taking transplant medicines or other medicines that weaken the immune system. TREATMENT  Your caregiver will often treat you with:  Antiviral drugs.   Anti-inflammatory drugs.   Pain medicines.  Bed rest is very important in preventing the pain associated with herpes zoster (postherpetic neuralgia). Application of heat in the form of a hot water bottle or electric heating pad or gentle pressure with the hand is recommended to help with the pain or discomfort. PREVENTION  A varicella zoster vaccine is available to help protect against the virus. The Food and Drug Administration approved the varicella zoster vaccine for individuals 65 years of age and older. HOME CARE INSTRUCTIONS   Cool compresses to the area of rash may be helpful.   Only take over-the-counter or prescription medicines for pain,  discomfort, or fever as directed by your caregiver.   Avoid contact with:   Babies.   Pregnant women.   Children with eczema.   Elderly people with transplants.   People with chronic illnesses, such as leukemia and AIDS.   If the area involved is on your face, you may receive a referral for follow-up to a specialist. It is very important to keep all follow-up appointments. This will help avoid eye complications, chronic pain, or disability.  SEEK IMMEDIATE MEDICAL CARE IF:   You develop any pain (headache) in the area of the face or eye. This must be followed carefully by your caregiver or ophthalmologist. An infection in part of your eye (cornea) can be very serious. It could lead to blindness.   You do not have pain relief from prescribed medicines.   Your redness or swelling spreads.   The area involved becomes very swollen and painful.   You have a fever.   You notice any red or painful lines extending away from the affected area toward your heart (lymphangitis).   Your condition is worsening or has changed.  Document Released: 08/18/2005 Document Revised: 08/07/2011 Document Reviewed: 07/23/2009 [facility] Patient Information 2012 [place], [place].   Try to stay in the shade while at the beach. Make sure to wear sunscreen, wear long shorts to cover leg to avoid exposure to the sun.  We have called in 2 prescriptions:  1. Valtrex 1gm three times daily for 7 days  2. Triamcinolone lotionl:  Apply twice daily to affected areas

## 2012-05-06 ENCOUNTER — Telehealth: Payer: Self-pay | Admitting: Internal Medicine

## 2012-05-06 ENCOUNTER — Ambulatory Visit (HOSPITAL_BASED_OUTPATIENT_CLINIC_OR_DEPARTMENT_OTHER): Payer: Medicare Other | Admitting: Physician Assistant

## 2012-05-06 ENCOUNTER — Ambulatory Visit: Payer: Medicare Other | Admitting: Pharmacist

## 2012-05-06 ENCOUNTER — Other Ambulatory Visit: Payer: Self-pay | Admitting: *Deleted

## 2012-05-06 ENCOUNTER — Other Ambulatory Visit (HOSPITAL_BASED_OUTPATIENT_CLINIC_OR_DEPARTMENT_OTHER): Payer: Medicare Other | Admitting: Lab

## 2012-05-06 ENCOUNTER — Telehealth: Payer: Self-pay | Admitting: *Deleted

## 2012-05-06 ENCOUNTER — Ambulatory Visit (HOSPITAL_BASED_OUTPATIENT_CLINIC_OR_DEPARTMENT_OTHER): Payer: Medicare Other

## 2012-05-06 VITALS — BP 138/76 | HR 64 | Temp 97.7°F | Resp 18 | Ht 61.0 in | Wt 123.9 lb

## 2012-05-06 DIAGNOSIS — C9000 Multiple myeloma not having achieved remission: Secondary | ICD-10-CM

## 2012-05-06 DIAGNOSIS — I82509 Chronic embolism and thrombosis of unspecified deep veins of unspecified lower extremity: Secondary | ICD-10-CM

## 2012-05-06 DIAGNOSIS — I82409 Acute embolism and thrombosis of unspecified deep veins of unspecified lower extremity: Secondary | ICD-10-CM

## 2012-05-06 DIAGNOSIS — Z5112 Encounter for antineoplastic immunotherapy: Secondary | ICD-10-CM

## 2012-05-06 LAB — CBC WITH DIFFERENTIAL/PLATELET
BASO%: 0.3 % (ref 0.0–2.0)
EOS%: 1.1 % (ref 0.0–7.0)
HCT: 36.4 % (ref 34.8–46.6)
LYMPH%: 13.6 % — ABNORMAL LOW (ref 14.0–49.7)
MCH: 29.6 pg (ref 25.1–34.0)
MCHC: 33.2 g/dL (ref 31.5–36.0)
MCV: 89 fL (ref 79.5–101.0)
MONO%: 8.9 % (ref 0.0–14.0)
NEUT%: 76.1 % (ref 38.4–76.8)
Platelets: 225 10*3/uL (ref 145–400)

## 2012-05-06 LAB — COMPREHENSIVE METABOLIC PANEL (CC13)
ALT: 13 U/L (ref 0–55)
AST: 17 U/L (ref 5–34)
Albumin: 3.3 g/dL — ABNORMAL LOW (ref 3.5–5.0)
CO2: 27 mEq/L (ref 22–29)
Chloride: 103 mEq/L (ref 98–107)
Creatinine: 1 mg/dL (ref 0.6–1.1)
Potassium: 3.3 mEq/L — ABNORMAL LOW (ref 3.5–5.1)
Sodium: 142 mEq/L (ref 136–145)
Total Protein: 6.2 g/dL — ABNORMAL LOW (ref 6.4–8.3)

## 2012-05-06 LAB — PROTIME-INR

## 2012-05-06 LAB — LACTATE DEHYDROGENASE (CC13): LDH: 290 U/L — ABNORMAL HIGH (ref 125–220)

## 2012-05-06 LAB — CORRECTED CALCIUM (CC13): Calcium, Corrected: 10.1 mg/dL (ref 8.4–10.4)

## 2012-05-06 MED ORDER — OXYCODONE HCL 5 MG PO TABS: ORAL_TABLET | ORAL | Status: DC

## 2012-05-06 MED ORDER — ONDANSETRON 8 MG/50ML IVPB (CHCC)
8.0000 mg | Freq: Once | INTRAVENOUS | Status: AC
  Administered 2012-05-06: 8 mg via INTRAVENOUS

## 2012-05-06 MED ORDER — BORTEZOMIB CHEMO IV INJECTION 3.5 MG
1.3000 mg/m2 | Freq: Once | INTRAMUSCULAR | Status: AC
  Administered 2012-05-06: 2 mg via INTRAVENOUS
  Filled 2012-05-06: qty 2

## 2012-05-06 MED ORDER — SODIUM CHLORIDE 0.9 % IV SOLN
Freq: Once | INTRAVENOUS | Status: AC
  Administered 2012-05-06: 16:00:00 via INTRAVENOUS

## 2012-05-06 NOTE — Telephone Encounter (Signed)
Per staff message and POF I have scheduled appts.  JMW  

## 2012-05-06 NOTE — Telephone Encounter (Signed)
gve the pt her sept,oct 2013 appt calendar. Pt is aware that her tx's will be added.Crissie Figures a staff message

## 2012-05-06 NOTE — Progress Notes (Signed)
INR therapeutic today (2.1) on 5mg  daily except 2.5mg  on MWF No missed doses.  No changes in meds or diet.  No problems with bleeding or bruising/s/s of clotting. Will continue current dose, and recheck INR in 2 weeks.

## 2012-05-06 NOTE — Patient Instructions (Addendum)
Continue weekly Velcade Follow up in 3 weeks

## 2012-05-06 NOTE — Patient Instructions (Signed)
[facility] Discharge Instructions for Patients Receiving Chemotherapy  Today you received the following chemotherapy agents valcade  To help prevent nausea and vomiting after your treatment, we encourage you to take your nausea medication as directed    If you develop nausea and vomiting that is not controlled by your nausea medication, call the clinic. If it is after clinic hours your family physician or the after hours number for the clinic or go to the Emergency Department.   BELOW ARE SYMPTOMS THAT SHOULD BE REPORTED IMMEDIATELY:  *FEVER GREATER THAN 100.5 F  *CHILLS WITH OR WITHOUT FEVER  NAUSEA AND VOMITING THAT IS NOT CONTROLLED WITH YOUR NAUSEA MEDICATION  *UNUSUAL SHORTNESS OF BREATH  *UNUSUAL BRUISING OR BLEEDING  TENDERNESS IN MOUTH AND THROAT WITH OR WITHOUT PRESENCE OF ULCERS  *URINARY PROBLEMS  *BOWEL PROBLEMS  UNUSUAL RASH Items with * indicate a potential emergency and should be followed up as soon as possible.  One of the nurses will contact you 24 hours after your treatment. Please let the nurse know about any problems that you may have experienced. Feel free to call the clinic you have any questions or concerns. The clinic phone number is [phone].   I have been informed and understand all the instructions given to me. I know to contact the clinic, my physician, or go to the Emergency Department if any problems should occur. I do not have any questions at this time, but understand that I may call the clinic during office hours   should I have any questions or need assistance in obtaining follow up care.    __________________________________________  _____________  __________ Signature of Patient or Authorized Representative            Date                   Time    __________________________________________ Nurse's Signature

## 2012-05-11 NOTE — Progress Notes (Signed)
[facility] [phone]   [phone]  OFFICE PROGRESS NOTE  [name], MD [address] [place] [address]  DIAGNOSIS:  1. multiple myeloma diagnosed in September 2009,  2. history of bilateral lower extremity deep vein thrombosis diagnosed in November 2009  3. acute on chronic deep vein thrombosis in the left lower extremity diagnosed in October 2010.  PRIOR THERAPY:  1. status post palliative radiotherapy to the right femur and is she'll tuberosity, first was done in December 2011 and the second treatment was completed Jan 08 2011, and the third treatment to the right distal femur completed on 04/30/2011  2. status post palliative radiotherapy to the left proximal femur completed 02/28/2011.  3. Revlimid 25 mg by mouth daily for 21 days every 4 weeks in addition to oral Decadron at 40 mg by mouth on a weekly basis status post 37 cycles.  CURRENT THERAPY:  1. weekly subcutaneous Velcade 1.3 mg/M2 status post 30 cycles.  2. Coumadin 5 mg on Mondays and Thursdays and 2.5 mg on all other days.  3. Zometa 4 mg IV given every 2 months.  INTERVAL HISTORY: [name] 76 y.o. female returns to the clinic today for followup visit accompanied her 2 daughters. Just prior to family beach trip to patient presents complaining of what she thought was a burn related to HEENT head. She fact had a "shingles" outbreak affecting her left anterior thigh and left sacral area. She was placed on a course of Valtrex which she has completed. She is able to go to the beach and was comfortable and enjoyed the family trip. She has no further vesicular lesions and is having no further pain. She is having some constipation but is managing this well with over-the-counter preparations. She voiced no other specific complaints today. The patient is tolerating her treatment with subcutaneous weekly Velcade fairly well. She has no significant adverse effects. He denied  having any significant peripheral neuropathy, no weight loss or night sweats. She has no chest pain or shortness of breath.   MEDICAL HISTORY: Past Medical History  Diagnosis Date  . Blood transfusion   . Depression     Mild  . History of chicken pox   . Hyperlipidemia   . Hypertension   . Degenerative joint disease   . Diabetes mellitus     Steroid related  . Phlebitis   . Multiple myeloma     per Dr. [name], s/p palliative radiation for leg pain 2011 per Dr. Roselind Messier  . Deep vein thrombosis of bilateral lower extremities     ALLERGIES:   has no known allergies.  MEDICATIONS:  Current Outpatient Prescriptions  Medication Sig Dispense Refill  . ALPRAZolam (XANAX) 0.5 MG tablet TAKE ONE TABLET BY MOUTH THREE TIMES DAILY AS NEEDED FOR ANXIETY  60 tablet  0  . Calcium Carbonate-Vitamin D 600-400 MG-UNIT per tablet Take 1 tablet by mouth 3 (three) times daily with meals.        Marland Kitchen dexamethasone (DECADRON) 4 MG tablet TAKE 10 TABLETS BY MOUTH ONCE PER WEEK WITH FOOD AS DIRECTED  40 tablet  3  . glipiZIDE (GLUCOTROL XL) 2.5 MG 24 hr tablet Take 1 tablet (2.5 mg total) by mouth daily.  90 tablet  2  . glucose blood (ONE TOUCH ULTRA TEST) test strip Use as instructed  25 each  6  . ibuprofen (ADVIL,MOTRIN) 200 MG tablet Take 200 mg by mouth every 6 (six) hours as  needed.        Marland Kitchen KLOR-CON M20 20 MEQ tablet TAKE ONE TABLET BY MOUTH EVERY DAY  30 each  1  . Lancets (ONETOUCH ULTRASOFT) lancets Test blood sugar once daily or as directed.  100 each  11  . lisinopril-hydrochlorothiazide (PRINZIDE,ZESTORETIC) 20-12.5 MG per tablet Take 0.5 tablets by mouth daily.       . magnesium oxide (MAG-OX) 400 MG tablet Take 400 mg by mouth daily.       Marland Kitchen oxyCODONE (OXY IR/ROXICODONE) 5 MG immediate release tablet Take 1 1/2 tabs by mouth every 6 hours as needed for pain  90 tablet  0  . oxyCODONE (OXYCONTIN) 10 MG 12 hr tablet Take 1 tablet (10 mg total) by mouth every 12 (twelve) hours.  60 tablet  0  .  prochlorperazine (COMPAZINE) 10 MG tablet       . temazepam (RESTORIL) 30 MG capsule TAKE ONE CAPSULE BY MOUTH AT BEDTIME AS NEEDED FOR SLEEP  30 capsule  0  . triamcinolone lotion (KENALOG) 0.1 % Apply topically 2 (two) times daily. Apply BID to affected areas  60 mL  0  . valACYclovir (VALTREX) 1000 MG tablet Take 1 tablet (1,000 mg total) by mouth 3 (three) times daily. 1000mg  TID X 7 days  21 tablet  0  . warfarin (COUMADIN) 5 MG tablet TAKE ONE TABLET BY MOUTH ONCE DAILY ON TUESDAY, THURSDAY AND SATURDAY, TAKE 1/2 TABLET ON ALL OTHER DAYS OR AS DIRECTED  60 tablet  1   No current facility-administered medications for this visit.   Facility-Administered Medications Ordered in Other Visits  Medication Dose Route Frequency Provider Last Rate Last Dose  . Zoledronic Acid (ZOMETA) 4 mg IVPB  4 mg Intravenous Once Si Gaul, MD        SURGICAL HISTORY:  Past Surgical History  Procedure Date  . Abdominal hysterectomy     REVIEW OF SYSTEMS:  A comprehensive review of systems was negative except for: Musculoskeletal: positive for arthralgias and bone pain Recent herpes zoster outbreak affecting the left anterior thigh and left sacral area   PHYSICAL EXAMINATION: General appearance: alert, cooperative and no distress Head: Normocephalic, without obvious abnormality, atraumatic Neck: no adenopathy Lymph nodes: Cervical, supraclavicular, and axillary nodes normal. Resp: clear to auscultation bilaterally Cardio: regular rate and rhythm, S1, S2 normal, no murmur, click, rub or gallop GI: soft, non-tender; bowel sounds normal; no masses,  no organomegaly Extremities: extremities normal, atraumatic, no cyanosis or edema Skin: Eschar formation on the previously this acute or lesions on the left anterior thigh and left sacral area. Currently there are no active vesicles in the areas are nontender. There is no evidence of any superinfection.  ECOG PERFORMANCE STATUS: 1 - Symptomatic but  completely ambulatory  Blood pressure 138/76, pulse 64, temperature 97.7 F (36.5 C), temperature source Oral, resp. rate 18, height 5\' 1"  (1.549 m), weight 123 lb 14.4 oz (56.201 kg).  LABORATORY DATA: Lab Results  Component Value Date   WBC 3.7* 05/06/2012   HGB 12.1 05/06/2012   HCT 36.4 05/06/2012   MCV 89.0 05/06/2012   PLT 225 05/06/2012      Chemistry      Component Value Date/Time   NA 142 05/06/2012 1351   NA 141 04/22/2012 1421   K 3.3* 05/06/2012 1351   K 3.7 04/22/2012 1421   CL 103 05/06/2012 1351   CL 102 04/22/2012 1421   CO2 27 05/06/2012 1351   CO2 31 04/22/2012 1421  BUN 17.0 05/06/2012 1351   BUN 16 04/22/2012 1421   CREATININE 1.0 05/06/2012 1351   CREATININE 1.02 04/22/2012 1421      Component Value Date/Time   CALCIUM 9.9 04/22/2012 1421   CALCIUM  Value: 5.7 CRITICAL RESULT CALLED TO, READ BACK BY AND VERIFIED WITH: JAMIE TRACY,RN [id number] @ 1433 BY J SCOTTON (NOTE)  Amended report. Result repeated and verified. CORRECTED ON 10/14 AT 1429: PREVIOUSLY REPORTED AS Result repeated and verified.* 06/13/2008 1605   ALKPHOS 49 05/06/2012 1351   ALKPHOS 43 04/22/2012 1421   AST 17 05/06/2012 1351   AST 18 04/22/2012 1421   ALT 13 05/06/2012 1351   ALT 14 04/22/2012 1421   BILITOT 0.40 05/06/2012 1351   BILITOT 0.3 04/22/2012 1421       RADIOGRAPHIC STUDIES: No results found.  ASSESSMENTPLAN: This is a very pleasant 76 years old [place] American female with history of multiple myeloma currently on treatment with subcutaneous weekly Velcade and tolerating it fairly well. She has no evidence for disease progression on the recent myeloma panel.Patient was discussed Dr. [name]. She will continue on her weekly Velcade as scheduled. She will continue on her current pain medications. Her recent herpes zoster polyp rate affecting the left anterior thigh and left sacral region reveals no active lesions and is currently nontender. She'll return in 3 weeks for another symptom management visit with  another CBC differential, C. met and LDH.  [name], Kalyb Pemble E, PA-C   All questions were answered. The patient knows to call the clinic with any problems, questions or concerns. We can certainly see the patient much sooner if necessary.  I spent 20 minutes counseling the patient face to face. The total time spent in the appointment was 30 minutes.

## 2012-05-13 ENCOUNTER — Telehealth: Payer: Self-pay | Admitting: Internal Medicine

## 2012-05-13 ENCOUNTER — Other Ambulatory Visit: Payer: Self-pay | Admitting: Physician Assistant

## 2012-05-13 ENCOUNTER — Telehealth: Payer: Self-pay | Admitting: Medical Oncology

## 2012-05-13 ENCOUNTER — Ambulatory Visit (HOSPITAL_BASED_OUTPATIENT_CLINIC_OR_DEPARTMENT_OTHER): Payer: Medicare Other

## 2012-05-13 ENCOUNTER — Other Ambulatory Visit (HOSPITAL_BASED_OUTPATIENT_CLINIC_OR_DEPARTMENT_OTHER): Payer: Medicare Other | Admitting: Lab

## 2012-05-13 ENCOUNTER — Ambulatory Visit (HOSPITAL_COMMUNITY)
Admission: RE | Admit: 2012-05-13 | Discharge: 2012-05-13 | Disposition: A | Discharge status: Home / Self Care | Payer: Medicare Other | Source: Ambulatory Visit | Attending: Physician Assistant | Admitting: Physician Assistant

## 2012-05-13 VITALS — BP 130/84 | HR 82 | Temp 98.6°F | Resp 20

## 2012-05-13 DIAGNOSIS — C9000 Multiple myeloma not having achieved remission: Secondary | ICD-10-CM

## 2012-05-13 DIAGNOSIS — R059 Cough, unspecified: Secondary | ICD-10-CM | POA: Insufficient documentation

## 2012-05-13 DIAGNOSIS — Z5112 Encounter for antineoplastic immunotherapy: Secondary | ICD-10-CM

## 2012-05-13 DIAGNOSIS — R05 Cough: Secondary | ICD-10-CM | POA: Insufficient documentation

## 2012-05-13 DIAGNOSIS — I517 Cardiomegaly: Secondary | ICD-10-CM | POA: Insufficient documentation

## 2012-05-13 LAB — CBC WITH DIFFERENTIAL/PLATELET
BASO%: 0.2 % (ref 0.0–2.0)
EOS%: 2 % (ref 0.0–7.0)
HCT: 37.3 % (ref 34.8–46.6)
LYMPH%: 9.9 % — ABNORMAL LOW (ref 14.0–49.7)
MCH: 29.7 pg (ref 25.1–34.0)
MCHC: 32.4 g/dL (ref 31.5–36.0)
MCV: 91.6 fL (ref 79.5–101.0)
MONO#: 0.6 10*3/uL (ref 0.1–0.9)
MONO%: 12.9 % (ref 0.0–14.0)
NEUT%: 75 % (ref 38.4–76.8)
Platelets: 185 10*3/uL (ref 145–400)
RBC: 4.07 10*6/uL (ref 3.70–5.45)
WBC: 4.4 10*3/uL (ref 3.9–10.3)

## 2012-05-13 LAB — COMPREHENSIVE METABOLIC PANEL (CC13)
ALT: 15 U/L (ref 0–55)
AST: 20 U/L (ref 5–34)
Alkaline Phosphatase: 54 U/L (ref 40–150)
Creatinine: 1 mg/dL (ref 0.6–1.1)
Sodium: 139 mEq/L (ref 136–145)
Total Bilirubin: 0.4 mg/dL (ref 0.20–1.20)
Total Protein: 6.5 g/dL (ref 6.4–8.3)

## 2012-05-13 LAB — LACTATE DEHYDROGENASE (CC13): LDH: 303 U/L — ABNORMAL HIGH (ref 125–220)

## 2012-05-13 MED ORDER — SODIUM CHLORIDE 0.9 % IV SOLN
Freq: Once | INTRAVENOUS | Status: AC
  Administered 2012-05-13: 15:00:00 via INTRAVENOUS

## 2012-05-13 MED ORDER — BORTEZOMIB CHEMO IV INJECTION 3.5 MG
1.3000 mg/m2 | Freq: Once | INTRAMUSCULAR | Status: AC
  Administered 2012-05-13: 2 mg via INTRAVENOUS
  Filled 2012-05-13: qty 2

## 2012-05-13 MED ORDER — ONDANSETRON 8 MG/50ML IVPB (CHCC)
8.0000 mg | Freq: Once | INTRAVENOUS | Status: AC
  Administered 2012-05-13: 8 mg via INTRAVENOUS

## 2012-05-13 NOTE — Telephone Encounter (Signed)
Reports pt has a  productive cough x 4 days -sputum is clear . It's worse when she is supine. Denies fever. She felt bad yesterday. Delysm not working. Pt coming in today for labs and treatment.

## 2012-05-13 NOTE — Telephone Encounter (Signed)
S/w with the pt's daughter and gv rescheduled appointment time on sept 19. Daughter aware.

## 2012-05-13 NOTE — Telephone Encounter (Signed)
cxr ordered per [place]

## 2012-05-13 NOTE — Patient Instructions (Signed)
Patient aware of next appointment; discharged home with no complaints. 

## 2012-05-13 NOTE — Progress Notes (Signed)
Patient arrived today c/o cough and chest congestion; CT-chest ordered per Tiana Loft, PA; patient aware to go to Radiology after infusion.

## 2012-05-14 ENCOUNTER — Telehealth: Payer: Self-pay | Admitting: Family Medicine

## 2012-05-14 ENCOUNTER — Encounter: Payer: Self-pay | Admitting: Family Medicine

## 2012-05-14 ENCOUNTER — Ambulatory Visit (INDEPENDENT_AMBULATORY_CARE_PROVIDER_SITE_OTHER): Payer: Medicare Other | Admitting: Family Medicine

## 2012-05-14 ENCOUNTER — Ambulatory Visit (INDEPENDENT_AMBULATORY_CARE_PROVIDER_SITE_OTHER)
Admission: RE | Admit: 2012-05-14 | Discharge: 2012-05-14 | Disposition: A | Discharge status: Home / Self Care | Payer: Medicare Other | Source: Ambulatory Visit | Attending: Family Medicine | Admitting: Family Medicine

## 2012-05-14 ENCOUNTER — Telehealth: Payer: Self-pay | Admitting: Medical Oncology

## 2012-05-14 VITALS — BP 158/82 | HR 86 | Temp 98.2°F | Wt 125.8 lb

## 2012-05-14 DIAGNOSIS — C9000 Multiple myeloma not having achieved remission: Secondary | ICD-10-CM

## 2012-05-14 DIAGNOSIS — M25552 Pain in left hip: Secondary | ICD-10-CM

## 2012-05-14 DIAGNOSIS — M25559 Pain in unspecified hip: Secondary | ICD-10-CM

## 2012-05-14 DIAGNOSIS — M549 Dorsalgia, unspecified: Secondary | ICD-10-CM

## 2012-05-14 MED ORDER — MOXIFLOXACIN HCL 400 MG PO TABS: 400.0000 mg | ORAL_TABLET | Freq: Every day | ORAL | Status: AC

## 2012-05-14 NOTE — Telephone Encounter (Signed)
Just getting this message now. i will see at 3:15pm.  Fine to wait. In future, please come find me if asking for immediate response.

## 2012-05-14 NOTE — Telephone Encounter (Signed)
Sue Lush notified as instructed by telephone. Apologized for late call back.

## 2012-05-14 NOTE — Progress Notes (Signed)
  Subjective:    Patient ID: [name], female    DOB: 06-15-1936, 76 y.o.   MRN: [id number]  HPI CC: L hip pain after fall  Presents with caregiver who helps give story.  Complicated 76 yo pt of Dr. Lianne Bushy new to me with h/o mult myeloma with bony myelomatous lesions and on coumadin for h/o bilat LE DVTs presents with L leg pain after fall.  DOI: 05/13/2012 early AM (like 4am).  Got up to use restroom.  Larey Seat backwards after turning too quickly, hit left buttock/hip on dresser.  Did not fully fall down, able to catch herself.  Denies hitting head.  No prodromal sxs.  Now endorses intermittent pain starting at left buttock and traveling down lateral and anterior left upper leg.  Currently not in significant pain.  On oxycontin 10mg  twice a day and oxycodone 5mg  4-5 pills a day on average.  Takes ibuprofen as needed, usually don't need.  Told can take 1-2 pills per day but only when necessary.  Recent shingles outbreak on left leg - buttock and anterior upper thigh - had been taking more ibuprofen with this.  Also treated with 7d course valtrex, finished med 05/06/2012.  States started having hip pain before fall.  Started on avelox today by onc for productive cough.  CXR showing new R basilar opacity.  Denies fevers/chills.  On vinblastine for MM.  Past Medical History  Diagnosis Date  . Blood transfusion   . Depression     Mild  . History of chicken pox   . Hyperlipidemia   . Hypertension   . Degenerative joint disease   . Diabetes mellitus     Steroid related  . Phlebitis   . Multiple myeloma     per Dr. [name], s/p palliative radiation for leg pain 2011 per Dr. Roselind Messier  . Deep vein thrombosis of bilateral lower extremities      Review of Systems Per HPI    Objective:   Physical Exam  Nursing note and vitals reviewed. Constitutional: She appears well-developed and well-nourished. No distress.       Comes in with wheelchair, but able to stand up and bear weight on her own,  albeit slowly  Musculoskeletal:       No pain with int/ext rotation at hip. Neg SLR test. No pain with FABER. Mild discomfort at L SIJ and sciatic notch. + tender to palpation L GTB.  Neurological:       Some weakness noted bilateral lower legs, but nonspecific, unsure if different from baseline as seems slowed movements at baseline  Skin: Skin is warm and dry. Rash noted.       hyperpigmented inflammatory changes after shingles rash on left buttock and left upper anterior thigh.  Skin intact  Psychiatric:       Somewhat difficult to understand.       Assessment & Plan:

## 2012-05-14 NOTE — Patient Instructions (Addendum)
I think this is more post herpetic neuralgia. There may be a component of bursitis of left hip. Treat with continued pain medications, may add tylenol 500mg  three times daily, take scheduled for the next week to see if improvement. Let us know if not better. Xray of pelvis today - looking ok to me.

## 2012-05-14 NOTE — Telephone Encounter (Signed)
Wanting results of CXR. Daughter stated Lynn fell wed night going to bathroom , she turned around too fast and she fell on a chest and landed on her  buttock. She denies sob " feel pretty good"  except pain from her fall. Her voice was strong she did not sound short of breath and denies fever. I instructed her to drink more fluids. Her daughter stated she is waiting to hear back from PCP Dr Para March for appt today . Per Dr Darrold Span start avelox and pt may take mucinex/.. I called in rx and notified Ondrea.

## 2012-05-14 NOTE — Telephone Encounter (Signed)
Caller: [name]/Child; Patient Name: [name]; PCP: [name] Clelia Croft) [facility]); Best Callback Phone Number: [phone]; Calling today 05/14/12 regarding Mother fell early Thursday 05/14/11 and hit her left hip on the corner of the dresser.  Complaining of pain this AM.  Emergent symptoms ruled out by Hip Injury guidelines with exception of new moderate to severe pain (able to bear some weight, limits normal activities).  (See Provider Within 4 Hours).  Care advice given.  OFFICE NO APPOINTMENT AVAILABLE UNTIL 2:15 PM, PATIENT WILL NEED TO BE SEEN BY 1 PM.  PLEASE CALL DAUGHTER BACK AT [phone] TO LET HER KNOW IF MOTHER WILL BE WORKED IN FOR AN EARLIER APPOINTMENT OR IF MD APPROVES OF HER WAITING FOR A LATER APPOINTMENT TIME.

## 2012-05-16 DIAGNOSIS — M25552 Pain in left hip: Secondary | ICD-10-CM | POA: Insufficient documentation

## 2012-05-16 NOTE — Assessment & Plan Note (Signed)
On coumadin. Story and exam point against hip pathology.  will check pelvis xray today to r/o occult pelvic fracture in h/o MM after recent fall - overall stable on my read. I suspect component of post-herpetic neuralgia iven ph/o pain present even prior to fall. Also may have greater trochanteric bursitis on left. Already on oxycodone and oxycontin, and minimal ibuprofen given on coumadin. Recommend treat with ice/heat, and scheduled tylenol 500mg  tid for the next week.   To call us with update next week, sooner if worsening. Could consider trial of nerve pain med such as gabapentin.

## 2012-05-17 ENCOUNTER — Telehealth: Payer: Self-pay | Admitting: Family Medicine

## 2012-05-17 MED ORDER — GABAPENTIN 100 MG PO CAPS: ORAL_CAPSULE | ORAL | Status: DC

## 2012-05-17 NOTE — Telephone Encounter (Signed)
[name] daughter called back hoping to get an update.  Thanks!

## 2012-05-17 NOTE — Telephone Encounter (Signed)
Pt is also taking Oxycodone long acting and immediate release and no relief from pain. Pts daughter understood Dr Reece Agar to say at appt on Fri that if pain med does not help call back for nerve med to be called to [facility]. Sue Lush can be reached at [phone].

## 2012-05-17 NOTE — Telephone Encounter (Signed)
Would try gabapentin.  Start with 100mg  tabs.  Start with 1 tab a day, increasing up to 200mg  3 times a day (6 pills per day).  See if that helps the pain.  rx sent.

## 2012-05-17 NOTE — Telephone Encounter (Signed)
Andrea advised.

## 2012-05-17 NOTE — Telephone Encounter (Signed)
[name] daughter, Jhania Trenchard, called and wanted Dr.Duncan to know that the medicine prescribed was not helping her pain.  The daughter states the patient has been unable to sleep due to the amount of pain she is in.  She is hoping something else can be called in to help (to the [facility] on [address])   Her call back number - [phone]

## 2012-05-18 ENCOUNTER — Telehealth: Payer: Self-pay | Admitting: Medical Oncology

## 2012-05-18 NOTE — Telephone Encounter (Signed)
Per Adrena it is okay to take neurontin ,please monitor for excessive drowsiness and call if concerned.

## 2012-05-18 NOTE — Telephone Encounter (Signed)
Pt was started on neurontin for post herpetic neuralgia. IS it okay to take with her pain med?

## 2012-05-19 ENCOUNTER — Other Ambulatory Visit: Payer: Self-pay | Admitting: Medical Oncology

## 2012-05-19 DIAGNOSIS — C9000 Multiple myeloma not having achieved remission: Secondary | ICD-10-CM

## 2012-05-19 MED ORDER — OXYCODONE HCL 10 MG PO TB12: 10.0000 mg | ORAL_TABLET | Freq: Two times a day (BID) | ORAL | Status: DC

## 2012-05-19 NOTE — Telephone Encounter (Signed)
Rx locked up in injection room for pickup

## 2012-05-20 ENCOUNTER — Ambulatory Visit (HOSPITAL_BASED_OUTPATIENT_CLINIC_OR_DEPARTMENT_OTHER): Payer: Medicare Other

## 2012-05-20 ENCOUNTER — Ambulatory Visit (HOSPITAL_BASED_OUTPATIENT_CLINIC_OR_DEPARTMENT_OTHER): Payer: Medicare Other | Admitting: Physician Assistant

## 2012-05-20 ENCOUNTER — Other Ambulatory Visit (HOSPITAL_BASED_OUTPATIENT_CLINIC_OR_DEPARTMENT_OTHER): Payer: Medicare Other | Admitting: Lab

## 2012-05-20 ENCOUNTER — Telehealth: Payer: Self-pay | Admitting: Internal Medicine

## 2012-05-20 ENCOUNTER — Encounter: Payer: Self-pay | Admitting: Physician Assistant

## 2012-05-20 ENCOUNTER — Ambulatory Visit (HOSPITAL_BASED_OUTPATIENT_CLINIC_OR_DEPARTMENT_OTHER): Payer: Medicare Other | Admitting: Pharmacist

## 2012-05-20 VITALS — BP 142/73 | HR 67 | Temp 98.3°F | Resp 20 | Ht 61.0 in | Wt 124.9 lb

## 2012-05-20 DIAGNOSIS — C9000 Multiple myeloma not having achieved remission: Secondary | ICD-10-CM

## 2012-05-20 DIAGNOSIS — Z5181 Encounter for therapeutic drug level monitoring: Secondary | ICD-10-CM

## 2012-05-20 DIAGNOSIS — I82409 Acute embolism and thrombosis of unspecified deep veins of unspecified lower extremity: Secondary | ICD-10-CM

## 2012-05-20 DIAGNOSIS — Z86718 Personal history of other venous thrombosis and embolism: Secondary | ICD-10-CM

## 2012-05-20 DIAGNOSIS — I82509 Chronic embolism and thrombosis of unspecified deep veins of unspecified lower extremity: Secondary | ICD-10-CM

## 2012-05-20 DIAGNOSIS — Z7901 Long term (current) use of anticoagulants: Secondary | ICD-10-CM

## 2012-05-20 DIAGNOSIS — Z5112 Encounter for antineoplastic immunotherapy: Secondary | ICD-10-CM

## 2012-05-20 DIAGNOSIS — B0229 Other postherpetic nervous system involvement: Secondary | ICD-10-CM

## 2012-05-20 LAB — CBC WITH DIFFERENTIAL/PLATELET
BASO%: 0.1 % (ref 0.0–2.0)
Eosinophils Absolute: 0 10*3/uL (ref 0.0–0.5)
HCT: 36.7 % (ref 34.8–46.6)
MCHC: 32.6 g/dL (ref 31.5–36.0)
MONO#: 0.5 10*3/uL (ref 0.1–0.9)
NEUT#: 3.5 10*3/uL (ref 1.5–6.5)
NEUT%: 77 % — ABNORMAL HIGH (ref 38.4–76.8)
WBC: 4.6 10*3/uL (ref 3.9–10.3)
lymph#: 0.5 10*3/uL — ABNORMAL LOW (ref 0.9–3.3)

## 2012-05-20 LAB — COMPREHENSIVE METABOLIC PANEL (CC13)
ALT: 19 U/L (ref 0–55)
Alkaline Phosphatase: 51 U/L (ref 40–150)
CO2: 27 mEq/L (ref 22–29)
Potassium: 3.5 mEq/L (ref 3.5–5.1)
Sodium: 142 mEq/L (ref 136–145)
Total Bilirubin: 0.3 mg/dL (ref 0.20–1.20)
Total Protein: 6.2 g/dL — ABNORMAL LOW (ref 6.4–8.3)

## 2012-05-20 LAB — PROTIME-INR: Protime: 45.6 Seconds — ABNORMAL HIGH (ref 10.6–13.4)

## 2012-05-20 LAB — POCT INR: INR: 3.8

## 2012-05-20 MED ORDER — ONDANSETRON 8 MG/50ML IVPB (CHCC)
8.0000 mg | Freq: Once | INTRAVENOUS | Status: AC
  Administered 2012-05-20: 8 mg via INTRAVENOUS

## 2012-05-20 MED ORDER — BORTEZOMIB CHEMO IV INJECTION 3.5 MG
1.3000 mg/m2 | Freq: Once | INTRAMUSCULAR | Status: AC
  Administered 2012-05-20: 2 mg via INTRAVENOUS
  Filled 2012-05-20: qty 2

## 2012-05-20 MED ORDER — SODIUM CHLORIDE 0.9 % IV SOLN
Freq: Once | INTRAVENOUS | Status: AC
  Administered 2012-05-20: 16:00:00 via INTRAVENOUS

## 2012-05-20 MED ORDER — ZOLEDRONIC ACID 4 MG/5ML IV CONC
3.0000 mg | Freq: Once | INTRAVENOUS | Status: AC
  Administered 2012-05-20: 3 mg via INTRAVENOUS
  Filled 2012-05-20: qty 3.75

## 2012-05-20 NOTE — Telephone Encounter (Signed)
apps made and printed for pt aom °

## 2012-05-20 NOTE — Patient Instructions (Addendum)
Follow up in 3 weeks with a repeat chest X-ray

## 2012-05-20 NOTE — Patient Instructions (Signed)
[facility] Discharge Instructions for Patients Receiving Chemotherapy  Today you received the following chemotherapy agents Velcade/Zometa.  To help prevent nausea and vomiting after your treatment, we encourage you to take your nausea medication as prescribed by Dr [name]. If you develop nausea and vomiting that is not controlled by your nausea medication, call the clinic. If it is after clinic hours your family physician or the after hours number for the clinic or go to the Emergency Department.   BELOW ARE SYMPTOMS THAT SHOULD BE REPORTED IMMEDIATELY:  *FEVER GREATER THAN 100.5 F  *CHILLS WITH OR WITHOUT FEVER  NAUSEA AND VOMITING THAT IS NOT CONTROLLED WITH YOUR NAUSEA MEDICATION  *UNUSUAL SHORTNESS OF BREATH  *UNUSUAL BRUISING OR BLEEDING  TENDERNESS IN MOUTH AND THROAT WITH OR WITHOUT PRESENCE OF ULCERS  *URINARY PROBLEMS  *BOWEL PROBLEMS  UNUSUAL RASH Items with * indicate a potential emergency and should be followed up as soon as possible.  Feel free to call the clinic you have any questions or concerns. The clinic phone number is [phone].

## 2012-05-20 NOTE — Progress Notes (Signed)
Will hold coumadin today.  Resume coumadin 5mg  daily except 2.5mg  on Mondays, Wednesdays and Fridays on 05/21/12.  Recheck INR in 1 week with scheduled treatment on 05/27/12; 1:45pm for lab, 2:15pm for treatment and 2:30pm for Coumadin Clinic.

## 2012-05-20 NOTE — Patient Instructions (Signed)
Hold coumadin today.  Resume coumadin 5mg  daily except 2.5mg  on Mondays, Wednesdays and Fridays on 05/21/12.  Recheck INR in 1 week with scheduled treatment on 05/27/12; 1:45pm for lab & 2:15pm for treatment. Rph will see you in infusion area.

## 2012-05-24 ENCOUNTER — Other Ambulatory Visit: Payer: Self-pay | Admitting: Medical Oncology

## 2012-05-24 DIAGNOSIS — C9000 Multiple myeloma not having achieved remission: Secondary | ICD-10-CM

## 2012-05-24 MED ORDER — OXYCODONE HCL 5 MG PO TABS: ORAL_TABLET | ORAL | Status: DC

## 2012-05-24 NOTE — Telephone Encounter (Signed)
Prescription locked in injection room

## 2012-05-24 NOTE — Progress Notes (Signed)
[facility] Telephone:([phone]) [phone]   Fax:([phone]) [phone]  OFFICE PROGRESS NOTE  [name], MD [address] [place] [address]  DIAGNOSIS:  1. multiple myeloma diagnosed in September 2009,  2. history of bilateral lower extremity deep vein thrombosis diagnosed in November 2009  3. acute on chronic deep vein thrombosis in the left lower extremity diagnosed in October 2010.  PRIOR THERAPY:  1. status post palliative radiotherapy to the right femur and is she'll tuberosity, first was done in December 2011 and the second treatment was completed Jan 08 2011, and the third treatment to the right distal femur completed on 04/30/2011  2. status post palliative radiotherapy to the left proximal femur completed 02/28/2011.  3. Revlimid 25 mg by mouth daily for 21 days every 4 weeks in addition to oral Decadron at 40 mg by mouth on a weekly basis status post 37 cycles.   CURRENT THERAPY:  1. weekly subcutaneous Velcade 1.3 mg/M2 status post 30 cycles.  2. Coumadin 5 mg on Mondays and Thursdays and 2.5 mg on all other days.  3. Zometa 4 mg IV given every 2 months.  INTERVAL HISTORY: [name] 76 y.o. female returns to the clinic today for followup visit accompanied her daughter. She is having some postherpetic neuralgia type pain and has been placed on Neurontin. She finds on the Neurontin helpful for the pain. She reports that the discomfort is diminishing somewhat. She recently had a chest x-ray that revealed a pneumonia and she is completing a course of Avelox. She continues to have a "wet sounding" cough and is able to cough up the secretions. She denied any fever or chills. She is having some constipation but is managing this well with over-the-counter preparations. She voiced no other specific complaints today. The patient is tolerating her treatment with subcutaneous weekly Velcade fairly well. She has no significant adverse effects. He denied having any  significant peripheral neuropathy, no weight loss or night sweats. She has no chest pain or shortness of breath.   MEDICAL HISTORY: Past Medical History  Diagnosis Date  . Blood transfusion   . Depression     Mild  . History of chicken pox   . Hyperlipidemia   . Hypertension   . Degenerative joint disease   . Diabetes mellitus     Steroid related  . Phlebitis   . Multiple myeloma     per Dr. [name], s/p palliative radiation for leg pain 2011 per Dr. Roselind Messier  . Deep vein thrombosis of bilateral lower extremities     ALLERGIES:   has no known allergies.  MEDICATIONS:  Current Outpatient Prescriptions  Medication Sig Dispense Refill  . ALPRAZolam (XANAX) 0.5 MG tablet TAKE ONE TABLET BY MOUTH THREE TIMES DAILY AS NEEDED FOR ANXIETY  60 tablet  0  . Calcium Carbonate-Vitamin D 600-400 MG-UNIT per tablet Take 1 tablet by mouth 3 (three) times daily with meals.        Marland Kitchen dexamethasone (DECADRON) 4 MG tablet TAKE 10 TABLETS BY MOUTH ONCE PER WEEK WITH FOOD AS DIRECTED  40 tablet  3  . gabapentin (NEURONTIN) 100 MG capsule Start with 1 pill a day.  Take up to 200mg  3 times a day (max 6 pills in a day)  100 capsule  3  . glipiZIDE (GLUCOTROL XL) 2.5 MG 24 hr tablet Take 1 tablet (2.5 mg total) by mouth daily.  90 tablet  2  . glucose blood (ONE TOUCH ULTRA  TEST) test strip Use as instructed  25 each  6  . ibuprofen (ADVIL,MOTRIN) 200 MG tablet Take 200 mg by mouth every 6 (six) hours as needed.        Marland Kitchen KLOR-CON M20 20 MEQ tablet TAKE ONE TABLET BY MOUTH EVERY DAY  30 each  1  . Lancets (ONETOUCH ULTRASOFT) lancets Test blood sugar once daily or as directed.  100 each  11  . lisinopril-hydrochlorothiazide (PRINZIDE,ZESTORETIC) 20-12.5 MG per tablet Take 0.5 tablets by mouth daily.       . magnesium oxide (MAG-OX) 400 MG tablet Take 400 mg by mouth daily.       Marland Kitchen moxifloxacin (AVELOX) 400 MG tablet Take 1 tablet (400 mg total) by mouth daily.  7 tablet  0  . oxyCODONE (OXY IR/ROXICODONE) 5  MG immediate release tablet Take 1 1/2 tabs by mouth every 6 hours as needed for pain  90 tablet  0  . oxyCODONE (OXYCONTIN) 10 MG 12 hr tablet Take 1 tablet (10 mg total) by mouth every 12 (twelve) hours.  60 tablet  0  . prochlorperazine (COMPAZINE) 10 MG tablet       . temazepam (RESTORIL) 30 MG capsule TAKE ONE CAPSULE BY MOUTH AT BEDTIME AS NEEDED FOR SLEEP  30 capsule  0  . triamcinolone lotion (KENALOG) 0.1 % Apply topically 2 (two) times daily. Apply BID to affected areas  60 mL  0  . valACYclovir (VALTREX) 1000 MG tablet Take 1 tablet (1,000 mg total) by mouth 3 (three) times daily. 1000mg  TID X 7 days  21 tablet  0  . warfarin (COUMADIN) 5 MG tablet TAKE ONE TABLET BY MOUTH ONCE DAILY ON TUESDAY, THURSDAY AND SATURDAY, TAKE 1/2 TABLET ON ALL OTHER DAYS OR AS DIRECTED  60 tablet  1   No current facility-administered medications for this visit.   Facility-Administered Medications Ordered in Other Visits  Medication Dose Route Frequency Provider Last Rate Last Dose  . Zoledronic Acid (ZOMETA) 4 mg IVPB  4 mg Intravenous Once Si Gaul, MD        SURGICAL HISTORY:  Past Surgical History  Procedure Date  . Abdominal hysterectomy     REVIEW OF SYSTEMS:  A comprehensive review of systems was negative except for: Musculoskeletal: positive for arthralgias and bone pain Neurological: positive for Postherpetic neuralgia pain Recent herpes zoster outbreak affecting the left anterior thigh and left sacral area   PHYSICAL EXAMINATION: General appearance: alert, cooperative and no distress Head: Normocephalic, without obvious abnormality, atraumatic Neck: no adenopathy Lymph nodes: Cervical, supraclavicular, and axillary nodes normal. Resp: clear to auscultation bilaterally Cardio: regular rate and rhythm, S1, S2 normal, no murmur, click, rub or gallop GI: soft, non-tender; bowel sounds normal; no masses,  no organomegaly Extremities: extremities normal, atraumatic, no cyanosis or  edema Skin: Eschar formation on the previously this acute or lesions on the left anterior thigh and left sacral area. Currently there are no active vesicles. There is no evidence of any superinfection.  ECOG PERFORMANCE STATUS: 1 - Symptomatic but completely ambulatory  Blood pressure 142/73, pulse 67, temperature 98.3 F (36.8 C), resp. rate 20, height 5\' 1"  (1.549 m), weight 124 lb 14.4 oz (56.654 kg).  LABORATORY DATA: Lab Results  Component Value Date   WBC 4.6 05/20/2012   HGB 11.9 05/20/2012   HCT 36.7 05/20/2012   MCV 92.8 05/20/2012   PLT 185 05/20/2012      Chemistry      Component Value Date/Time   NA  142 05/20/2012 1421   NA 141 04/22/2012 1421   K 3.5 05/20/2012 1421   K 3.7 04/22/2012 1421   CL 104 05/20/2012 1421   CL 102 04/22/2012 1421   CO2 27 05/20/2012 1421   CO2 31 04/22/2012 1421   BUN 19.0 05/20/2012 1421   BUN 16 04/22/2012 1421   CREATININE 1.1 05/20/2012 1421   CREATININE 1.02 04/22/2012 1421      Component Value Date/Time   CALCIUM 9.4 05/20/2012 1421   CALCIUM 9.9 04/22/2012 1421   CALCIUM  Value: 5.7 CRITICAL RESULT CALLED TO, READ BACK BY AND VERIFIED WITH: JAMIE TRACY,RN [id number] @ 1433 BY J SCOTTON (NOTE)  Amended report. Result repeated and verified. CORRECTED ON 10/14 AT 1429: PREVIOUSLY REPORTED AS Result repeated and verified.* 06/13/2008 1605   ALKPHOS 51 05/20/2012 1421   ALKPHOS 43 04/22/2012 1421   AST 22 05/20/2012 1421   AST 18 04/22/2012 1421   ALT 19 05/20/2012 1421   ALT 14 04/22/2012 1421   BILITOT 0.30 05/20/2012 1421   BILITOT 0.3 04/22/2012 1421       RADIOGRAPHIC STUDIES: No results found.  ASSESSMENTPLAN: This is a very pleasant 76 years old [place] American female with history of multiple myeloma currently on treatment with subcutaneous weekly Velcade and tolerating it fairly well. She has no evidence for disease progression on the recent myeloma panel.Patient was discussed Dr. [name]. She will continue on her weekly Velcade as scheduled.  She will continue on her current pain medications. Her recent herpes zoster outbreak affecting the left anterior thigh and left sacral region reveals no active lesions. Unfortunately now she is experiencing some postherpetic neuralgia type pain but isn't getting relief with Neurontin.  She'll return in 3 weeks for another symptom management visit with another CBC differential, C. met and LDH. We will also obtain a followup chest x-ray to be sure that her pneumonia has resolved and that there are no underlying masses.  Laural Benes, Devontaye Ground E, PA-C   All questions were answered. The patient knows to call the clinic with any problems, questions or concerns. We can certainly see the patient much sooner if necessary.  I spent 20 minutes counseling the patient face to face. The total time spent in the appointment was 30 minutes.

## 2012-05-25 ENCOUNTER — Other Ambulatory Visit: Payer: Self-pay | Admitting: Internal Medicine

## 2012-05-25 ENCOUNTER — Other Ambulatory Visit: Payer: Self-pay | Admitting: Medical Oncology

## 2012-05-25 NOTE — Telephone Encounter (Signed)
Called in refill for restoril 

## 2012-05-26 ENCOUNTER — Other Ambulatory Visit: Payer: Self-pay | Admitting: Family Medicine

## 2012-05-26 DIAGNOSIS — E119 Type 2 diabetes mellitus without complications: Secondary | ICD-10-CM

## 2012-05-27 ENCOUNTER — Other Ambulatory Visit: Payer: Medicare Other | Admitting: Lab

## 2012-05-27 ENCOUNTER — Other Ambulatory Visit (HOSPITAL_BASED_OUTPATIENT_CLINIC_OR_DEPARTMENT_OTHER): Payer: Medicare Other | Admitting: Lab

## 2012-05-27 ENCOUNTER — Ambulatory Visit: Payer: Medicare Other | Admitting: Physician Assistant

## 2012-05-27 ENCOUNTER — Ambulatory Visit (HOSPITAL_BASED_OUTPATIENT_CLINIC_OR_DEPARTMENT_OTHER): Payer: Medicare Other

## 2012-05-27 ENCOUNTER — Other Ambulatory Visit: Payer: Self-pay | Admitting: Internal Medicine

## 2012-05-27 ENCOUNTER — Ambulatory Visit: Payer: Medicare Other | Admitting: Pharmacist

## 2012-05-27 DIAGNOSIS — I82409 Acute embolism and thrombosis of unspecified deep veins of unspecified lower extremity: Secondary | ICD-10-CM

## 2012-05-27 DIAGNOSIS — Z5112 Encounter for antineoplastic immunotherapy: Secondary | ICD-10-CM

## 2012-05-27 DIAGNOSIS — C9000 Multiple myeloma not having achieved remission: Secondary | ICD-10-CM

## 2012-05-27 LAB — CBC WITH DIFFERENTIAL/PLATELET
BASO%: 0.4 % (ref 0.0–2.0)
EOS%: 1.1 % (ref 0.0–7.0)
MCH: 29.5 pg (ref 25.1–34.0)
MCHC: 32.4 g/dL (ref 31.5–36.0)
MONO#: 0.5 10*3/uL (ref 0.1–0.9)
NEUT%: 75.6 % (ref 38.4–76.8)
RBC: 4 10*6/uL (ref 3.70–5.45)
RDW: 14.8 % — ABNORMAL HIGH (ref 11.2–14.5)
WBC: 4.5 10*3/uL (ref 3.9–10.3)
lymph#: 0.5 10*3/uL — ABNORMAL LOW (ref 0.9–3.3)
nRBC: 0 % (ref 0–0)

## 2012-05-27 LAB — COMPREHENSIVE METABOLIC PANEL (CC13)
ALT: 14 U/L (ref 0–55)
AST: 20 U/L (ref 5–34)
BUN: 10 mg/dL (ref 7.0–26.0)
CO2: 27 mEq/L (ref 22–29)
Calcium: 9.3 mg/dL (ref 8.4–10.4)
Chloride: 104 mEq/L (ref 98–107)
Creatinine: 0.9 mg/dL (ref 0.6–1.1)
Total Bilirubin: 0.3 mg/dL (ref 0.20–1.20)

## 2012-05-27 LAB — LACTATE DEHYDROGENASE (CC13): LDH: 316 U/L — ABNORMAL HIGH (ref 125–220)

## 2012-05-27 LAB — PROTIME-INR: INR: 2.9 (ref 2.00–3.50)

## 2012-05-27 MED ORDER — SODIUM CHLORIDE 0.9 % IV SOLN
Freq: Once | INTRAVENOUS | Status: AC
  Administered 2012-05-27: 15:00:00 via INTRAVENOUS

## 2012-05-27 MED ORDER — BORTEZOMIB CHEMO IV INJECTION 3.5 MG
1.3000 mg/m2 | Freq: Once | INTRAMUSCULAR | Status: AC
  Administered 2012-05-27: 2 mg via INTRAVENOUS
  Filled 2012-05-27: qty 2

## 2012-05-27 MED ORDER — ONDANSETRON 8 MG/50ML IVPB (CHCC)
8.0000 mg | Freq: Once | INTRAVENOUS | Status: AC
  Administered 2012-05-27: 8 mg via INTRAVENOUS

## 2012-05-27 NOTE — Patient Instructions (Addendum)
[facility] Discharge Instructions for Patients Receiving Chemotherapy  Today you received the following chemotherapy agents Velcade  To help prevent nausea and vomiting after your treatment, we encourage you to take your nausea medication as directed.   If you develop nausea and vomiting that is not controlled by your nausea medication, call the clinic. If it is after clinic hours your family physician or the after hours number for the clinic or go to the Emergency Department.   BELOW ARE SYMPTOMS THAT SHOULD BE REPORTED IMMEDIATELY:  *FEVER GREATER THAN 100.5 F  *CHILLS WITH OR WITHOUT FEVER  NAUSEA AND VOMITING THAT IS NOT CONTROLLED WITH YOUR NAUSEA MEDICATION  *UNUSUAL SHORTNESS OF BREATH  *UNUSUAL BRUISING OR BLEEDING  TENDERNESS IN MOUTH AND THROAT WITH OR WITHOUT PRESENCE OF ULCERS  *URINARY PROBLEMS  *BOWEL PROBLEMS  UNUSUAL RASH Items with * indicate a potential emergency and should be followed up as soon as possible.  One of the nurses will contact you 24 hours after your treatment. Please let the nurse know about any problems that you may have experienced. Feel free to call the clinic you have any questions or concerns. The clinic phone number is [phone].   I have been informed and understand all the instructions given to me. I know to contact the clinic, my physician, or go to the Emergency Department if any problems should occur. I do not have any questions at this time, but understand that I may call the clinic during office hours   should I have any questions or need assistance in obtaining follow up care.    __________________________________________  _____________  __________ Signature of Patient or Authorized Representative            Date                   Time    __________________________________________ Nurse's Signature    

## 2012-05-27 NOTE — Progress Notes (Signed)
INR therapeutic (2.9) after holding 1 dose last week, then returning to 5mg  daily except 2.5mg  on MWF. No problems with bleeding or bruising.  Off Avelox now for almost a week. No changes in diet.  Started Valtrex today for shingles on her back. Will decrease patient's dose slightly to 2.5mg  daily except 5mg  on MWF to reflect decrease in total weekly dose over the last 7 days.   Recheck INR in 1 week with scheduled lab and infusion appt.  A pharmacist will see the pt in the infusion area.

## 2012-06-01 ENCOUNTER — Other Ambulatory Visit (INDEPENDENT_AMBULATORY_CARE_PROVIDER_SITE_OTHER): Payer: Medicare Other

## 2012-06-01 DIAGNOSIS — E119 Type 2 diabetes mellitus without complications: Secondary | ICD-10-CM

## 2012-06-01 LAB — HEMOGLOBIN A1C: Hgb A1c MFr Bld: 6.2 % (ref 4.6–6.5)

## 2012-06-03 ENCOUNTER — Ambulatory Visit: Payer: Medicare Other | Admitting: Pharmacist

## 2012-06-03 ENCOUNTER — Ambulatory Visit (HOSPITAL_BASED_OUTPATIENT_CLINIC_OR_DEPARTMENT_OTHER): Payer: Medicare Other

## 2012-06-03 ENCOUNTER — Other Ambulatory Visit: Payer: Self-pay | Admitting: Physician Assistant

## 2012-06-03 ENCOUNTER — Other Ambulatory Visit (HOSPITAL_BASED_OUTPATIENT_CLINIC_OR_DEPARTMENT_OTHER): Payer: Medicare Other | Admitting: Lab

## 2012-06-03 VITALS — BP 138/65 | HR 70 | Temp 98.9°F

## 2012-06-03 DIAGNOSIS — Z86718 Personal history of other venous thrombosis and embolism: Secondary | ICD-10-CM

## 2012-06-03 DIAGNOSIS — C9000 Multiple myeloma not having achieved remission: Secondary | ICD-10-CM

## 2012-06-03 DIAGNOSIS — I82409 Acute embolism and thrombosis of unspecified deep veins of unspecified lower extremity: Secondary | ICD-10-CM

## 2012-06-03 DIAGNOSIS — Z5112 Encounter for antineoplastic immunotherapy: Secondary | ICD-10-CM

## 2012-06-03 LAB — LACTATE DEHYDROGENASE (CC13): LDH: 262 U/L — ABNORMAL HIGH (ref 125–220)

## 2012-06-03 LAB — COMPREHENSIVE METABOLIC PANEL (CC13)
ALT: 14 U/L (ref 0–55)
Alkaline Phosphatase: 48 U/L (ref 40–150)
CO2: 26 mEq/L (ref 22–29)
Creatinine: 0.8 mg/dL (ref 0.6–1.1)
Sodium: 141 mEq/L (ref 136–145)
Total Bilirubin: 0.4 mg/dL (ref 0.20–1.20)
Total Protein: 6 g/dL — ABNORMAL LOW (ref 6.4–8.3)

## 2012-06-03 LAB — CBC WITH DIFFERENTIAL/PLATELET
BASO%: 0.2 % (ref 0.0–2.0)
EOS%: 1.9 % (ref 0.0–7.0)
HCT: 35.6 % (ref 34.8–46.6)
MCH: 30.7 pg (ref 25.1–34.0)
MCHC: 33.3 g/dL (ref 31.5–36.0)
MONO#: 0.4 10*3/uL (ref 0.1–0.9)
RBC: 3.86 10*6/uL (ref 3.70–5.45)
RDW: 15.2 % — ABNORMAL HIGH (ref 11.2–14.5)
WBC: 4 10*3/uL (ref 3.9–10.3)
lymph#: 0.4 10*3/uL — ABNORMAL LOW (ref 0.9–3.3)

## 2012-06-03 LAB — PROTIME-INR
INR: 1.5 — ABNORMAL LOW (ref 2.00–3.50)
Protime: 18 Seconds — ABNORMAL HIGH (ref 10.6–13.4)

## 2012-06-03 MED ORDER — ONDANSETRON 8 MG/50ML IVPB (CHCC)
8.0000 mg | Freq: Once | INTRAVENOUS | Status: AC
  Administered 2012-06-03: 8 mg via INTRAVENOUS

## 2012-06-03 MED ORDER — BORTEZOMIB CHEMO IV INJECTION 3.5 MG
1.3000 mg/m2 | Freq: Once | INTRAMUSCULAR | Status: AC
  Administered 2012-06-03: 2 mg via INTRAVENOUS
  Filled 2012-06-03: qty 2

## 2012-06-03 MED ORDER — SODIUM CHLORIDE 0.9 % IV SOLN
Freq: Once | INTRAVENOUS | Status: AC
  Administered 2012-06-03: 15:00:00 via INTRAVENOUS

## 2012-06-03 NOTE — Progress Notes (Signed)
INR is fluctuating and below goal of 2-3.  Will increase to 2.5mg  MWF and 5mg  other days.  I would recommend continuing this dose for a couple weeks if INR is close to goal in order to find an effective Coumadin dose instead of continuing to change Coumadin dose.

## 2012-06-03 NOTE — Patient Instructions (Addendum)
[facility] Discharge Instructions for Patients Receiving Chemotherapy  Today you received the following chemotherapy agents velxcade  To help prevent nausea and vomiting after your treatment, we encourage you to take your nausea medication and take it as often as prescribed    If you develop nausea and vomiting that is not controlled by your nausea medication, call the clinic. If it is after clinic hours your family physician or the after hours number for the clinic or go to the Emergency Department.   BELOW ARE SYMPTOMS THAT SHOULD BE REPORTED IMMEDIATELY:  *FEVER GREATER THAN 100.5 F  *CHILLS WITH OR WITHOUT FEVER  NAUSEA AND VOMITING THAT IS NOT CONTROLLED WITH YOUR NAUSEA MEDICATION  *UNUSUAL SHORTNESS OF BREATH  *UNUSUAL BRUISING OR BLEEDING  TENDERNESS IN MOUTH AND THROAT WITH OR WITHOUT PRESENCE OF ULCERS  *URINARY PROBLEMS  *BOWEL PROBLEMS  UNUSUAL RASH Items with * indicate a potential emergency and should be followed up as soon as possible.  One of the nurses will contact you 24 hours after your treatment. Please let the nurse know about any problems that you may have experienced. Feel free to call the clinic you have any questions or concerns. The clinic phone number is [phone].   I have been informed and understand all the instructions given to me. I know to contact the clinic, my physician, or go to the Emergency Department if any problems should occur. I do not have any questions at this time, but understand that I may call the clinic during office hours   should I have any questions or need assistance in obtaining follow up care.    __________________________________________  _____________  __________ Signature of Patient or Authorized Representative            Date                   Time    __________________________________________ Nurse's Signature

## 2012-06-08 ENCOUNTER — Ambulatory Visit (INDEPENDENT_AMBULATORY_CARE_PROVIDER_SITE_OTHER): Payer: Medicare Other | Admitting: Family Medicine

## 2012-06-08 ENCOUNTER — Encounter: Payer: Self-pay | Admitting: Family Medicine

## 2012-06-08 VITALS — BP 122/68 | HR 80 | Temp 98.6°F | Ht 60.0 in | Wt 126.0 lb

## 2012-06-08 DIAGNOSIS — B029 Zoster without complications: Secondary | ICD-10-CM

## 2012-06-08 DIAGNOSIS — Z23 Encounter for immunization: Secondary | ICD-10-CM

## 2012-06-08 DIAGNOSIS — Z1211 Encounter for screening for malignant neoplasm of colon: Secondary | ICD-10-CM

## 2012-06-08 DIAGNOSIS — E119 Type 2 diabetes mellitus without complications: Secondary | ICD-10-CM

## 2012-06-08 DIAGNOSIS — Z7189 Other specified counseling: Secondary | ICD-10-CM

## 2012-06-08 MED ORDER — LIDOCAINE 5 % EX PTCH: 1.0000 | MEDICATED_PATCH | CUTANEOUS | Status: DC

## 2012-06-08 MED ORDER — GABAPENTIN 100 MG PO CAPS: ORAL_CAPSULE | ORAL | Status: DC

## 2012-06-08 NOTE — Patient Instructions (Addendum)
Add on 1 pill of gabapentin a day to control pain. Take up to 12 pills in a day (up to 4 pills at a time, 3 times a day). If not better, use the patches.   If still not better, notify the clinic.  Recheck A1c at a visit in 6 months.  Take care.

## 2012-06-09 ENCOUNTER — Encounter: Payer: Self-pay | Admitting: Family Medicine

## 2012-06-09 DIAGNOSIS — B029 Zoster without complications: Secondary | ICD-10-CM | POA: Insufficient documentation

## 2012-06-09 DIAGNOSIS — Z7189 Other specified counseling: Secondary | ICD-10-CM | POA: Insufficient documentation

## 2012-06-09 DIAGNOSIS — Z1211 Encounter for screening for malignant neoplasm of colon: Secondary | ICD-10-CM | POA: Insufficient documentation

## 2012-06-09 NOTE — Assessment & Plan Note (Signed)
  Living will d/w pt.  She'll consider.  Would want her daughters designated if she were incapacitated.

## 2012-06-09 NOTE — Assessment & Plan Note (Signed)
A1c controlled, continue curretn meds.  Recheck 6 months.

## 2012-06-09 NOTE — Progress Notes (Signed)
L leg pain from shingles, not improved with 600mg  gabapentin per day.  No ADE.  Rash resolved.  Stinging pain.  No other similar pain/rash elsewhere.   Diabetes:  Using medications without difficulties:yes Hypoglycemic episodes:no Hyperglycemic episodes:no Feet problems:no Blood Sugars averaging: ~100 Still on steroids per [facility] center.   Colon cancer screening discussed. She would like to avoid colonoscopy.  No blood in stool known, no dark stools.  She elected to defer screening and this is reasonable.    Breast cancer screening. Mammogram discussed, up to date.    Due for flu shot.  PNA shot prev done after 67 per patient.  Not a candidate for shingles shot.    Living will d/w pt.  She'll consider.  Would want her daughters designated if she were incapacitated.   PMH and SH reviewed  Meds, vitals, and allergies reviewed.   ROS: See HPI.  Otherwise negative.    GEN: nad, alert and oriented HEENT: mucous membranes moist NECK: supple w/o LA CV: rrr. PULM: ctab, no inc wob ABD: soft, +bs EXT: no edema SKIN: no acute rash but L lateral thigh tender to light touch.

## 2012-06-09 NOTE — Assessment & Plan Note (Signed)
Colon cancer screening discussed. She would like to avoid colonoscopy.  No blood in stool known, no dark stools.  She elected to defer screening and this is reasonable.

## 2012-06-09 NOTE — Assessment & Plan Note (Signed)
Inc gabapentin.  If not improved, start lidoderm patches.  If not improved, notify me.  They agree.

## 2012-06-10 ENCOUNTER — Ambulatory Visit: Payer: Medicare Other | Admitting: Pharmacist

## 2012-06-10 ENCOUNTER — Ambulatory Visit (HOSPITAL_BASED_OUTPATIENT_CLINIC_OR_DEPARTMENT_OTHER): Payer: Medicare Other

## 2012-06-10 ENCOUNTER — Other Ambulatory Visit (HOSPITAL_BASED_OUTPATIENT_CLINIC_OR_DEPARTMENT_OTHER): Payer: Medicare Other | Admitting: Lab

## 2012-06-10 ENCOUNTER — Ambulatory Visit (HOSPITAL_BASED_OUTPATIENT_CLINIC_OR_DEPARTMENT_OTHER): Payer: Medicare Other | Admitting: Physician Assistant

## 2012-06-10 VITALS — BP 127/71 | HR 79 | Temp 98.7°F | Resp 18 | Ht 60.0 in | Wt 125.8 lb

## 2012-06-10 DIAGNOSIS — I82409 Acute embolism and thrombosis of unspecified deep veins of unspecified lower extremity: Secondary | ICD-10-CM

## 2012-06-10 DIAGNOSIS — Z5112 Encounter for antineoplastic immunotherapy: Secondary | ICD-10-CM

## 2012-06-10 DIAGNOSIS — C9000 Multiple myeloma not having achieved remission: Secondary | ICD-10-CM

## 2012-06-10 DIAGNOSIS — Z7901 Long term (current) use of anticoagulants: Secondary | ICD-10-CM

## 2012-06-10 LAB — CBC WITH DIFFERENTIAL/PLATELET
BASO%: 0.3 % (ref 0.0–2.0)
LYMPH%: 8.7 % — ABNORMAL LOW (ref 14.0–49.7)
MCH: 31.2 pg (ref 25.1–34.0)
MCHC: 33.5 g/dL (ref 31.5–36.0)
MCV: 93.2 fL (ref 79.5–101.0)
MONO%: 13.7 % (ref 0.0–14.0)
Platelets: 189 10*3/uL (ref 145–400)
RBC: 3.76 10*6/uL (ref 3.70–5.45)

## 2012-06-10 LAB — COMPREHENSIVE METABOLIC PANEL (CC13)
AST: 17 U/L (ref 5–34)
Albumin: 3.1 g/dL — ABNORMAL LOW (ref 3.5–5.0)
Alkaline Phosphatase: 48 U/L (ref 40–150)
Potassium: 3.6 mEq/L (ref 3.5–5.1)
Sodium: 141 mEq/L (ref 136–145)
Total Protein: 5.7 g/dL — ABNORMAL LOW (ref 6.4–8.3)

## 2012-06-10 LAB — PROTIME-INR
INR: 1.2 — ABNORMAL LOW (ref 2.00–3.50)
Protime: 14.4 Seconds — ABNORMAL HIGH (ref 10.6–13.4)

## 2012-06-10 LAB — POCT INR: INR: 1.2

## 2012-06-10 MED ORDER — ONDANSETRON 8 MG/50ML IVPB (CHCC)
8.0000 mg | Freq: Once | INTRAVENOUS | Status: AC
  Administered 2012-06-10: 8 mg via INTRAVENOUS

## 2012-06-10 MED ORDER — SODIUM CHLORIDE 0.9 % IV SOLN
Freq: Once | INTRAVENOUS | Status: AC
  Administered 2012-06-10: 17:00:00 via INTRAVENOUS

## 2012-06-10 MED ORDER — BORTEZOMIB CHEMO IV INJECTION 3.5 MG
1.3000 mg/m2 | Freq: Once | INTRAMUSCULAR | Status: AC
  Administered 2012-06-10: 2 mg via INTRAVENOUS
  Filled 2012-06-10: qty 2

## 2012-06-10 NOTE — Patient Instructions (Addendum)
Continue with your weekly chemotherapy Follow up in 3 weeks Follow up with the Coumadin Clinic as scheduled

## 2012-06-10 NOTE — Patient Instructions (Signed)
Increase Coumadin dose to 5mg  (1 tablet) daily except 2.5mg  (1/2 tablet) on Wednesdays. Check PT/INR in 1 week.

## 2012-06-10 NOTE — Progress Notes (Signed)
It is unclear what dose the patient actually took over the last week. Pt and daughter know they took 5mg  yesterday (Wednesday). This is different than what is listed in the per the last coumadin clinic visit. Will increase Coumadin dose to 5mg  (1 tablet) daily except 2.5mg  (1/2 tablet) on Wednesdays.  Re-evaluate PT/INR in 1 week with treatment.

## 2012-06-11 ENCOUNTER — Telehealth: Payer: Self-pay | Admitting: *Deleted

## 2012-06-11 ENCOUNTER — Other Ambulatory Visit: Payer: Self-pay | Admitting: Physician Assistant

## 2012-06-11 ENCOUNTER — Telehealth: Payer: Self-pay | Admitting: Internal Medicine

## 2012-06-11 NOTE — Telephone Encounter (Signed)
Per staff message I have adjusted the 10/17 appt. JMW

## 2012-06-11 NOTE — Telephone Encounter (Signed)
lvm regarding 10.17.13 appt times....mailed OCT schedule to pt.

## 2012-06-15 ENCOUNTER — Other Ambulatory Visit: Payer: Self-pay | Admitting: *Deleted

## 2012-06-15 DIAGNOSIS — C9000 Multiple myeloma not having achieved remission: Secondary | ICD-10-CM

## 2012-06-15 MED ORDER — OXYCODONE HCL 10 MG PO TB12: 10.0000 mg | ORAL_TABLET | Freq: Two times a day (BID) | ORAL | Status: DC

## 2012-06-16 ENCOUNTER — Other Ambulatory Visit: Payer: Self-pay | Admitting: Medical Oncology

## 2012-06-16 DIAGNOSIS — C9000 Multiple myeloma not having achieved remission: Secondary | ICD-10-CM

## 2012-06-16 MED ORDER — OXYCODONE HCL 5 MG PO TABS: ORAL_TABLET | ORAL | Status: DC

## 2012-06-16 NOTE — Telephone Encounter (Signed)
Rx placed in  injection room for pickup

## 2012-06-17 ENCOUNTER — Ambulatory Visit: Payer: Medicare Other | Admitting: Pharmacist

## 2012-06-17 ENCOUNTER — Other Ambulatory Visit (HOSPITAL_BASED_OUTPATIENT_CLINIC_OR_DEPARTMENT_OTHER): Payer: Medicare Other | Admitting: Lab

## 2012-06-17 ENCOUNTER — Other Ambulatory Visit: Payer: Self-pay | Admitting: Internal Medicine

## 2012-06-17 ENCOUNTER — Ambulatory Visit (HOSPITAL_BASED_OUTPATIENT_CLINIC_OR_DEPARTMENT_OTHER): Payer: Medicare Other

## 2012-06-17 DIAGNOSIS — Z5181 Encounter for therapeutic drug level monitoring: Secondary | ICD-10-CM

## 2012-06-17 DIAGNOSIS — Z5111 Encounter for antineoplastic chemotherapy: Secondary | ICD-10-CM

## 2012-06-17 DIAGNOSIS — C9000 Multiple myeloma not having achieved remission: Secondary | ICD-10-CM

## 2012-06-17 DIAGNOSIS — Z5112 Encounter for antineoplastic immunotherapy: Secondary | ICD-10-CM

## 2012-06-17 DIAGNOSIS — I82509 Chronic embolism and thrombosis of unspecified deep veins of unspecified lower extremity: Secondary | ICD-10-CM

## 2012-06-17 DIAGNOSIS — I82409 Acute embolism and thrombosis of unspecified deep veins of unspecified lower extremity: Secondary | ICD-10-CM

## 2012-06-17 DIAGNOSIS — Z7901 Long term (current) use of anticoagulants: Secondary | ICD-10-CM

## 2012-06-17 LAB — COMPREHENSIVE METABOLIC PANEL (CC13)
Alkaline Phosphatase: 50 U/L (ref 40–150)
CO2: 27 mEq/L (ref 22–29)
Creatinine: 0.8 mg/dL (ref 0.6–1.1)
Glucose: 82 mg/dl (ref 70–99)
Sodium: 142 mEq/L (ref 136–145)
Total Bilirubin: 0.3 mg/dL (ref 0.20–1.20)
Total Protein: 6.2 g/dL — ABNORMAL LOW (ref 6.4–8.3)

## 2012-06-17 LAB — CBC WITH DIFFERENTIAL/PLATELET
BASO%: 0.2 % (ref 0.0–2.0)
EOS%: 2.2 % (ref 0.0–7.0)
LYMPH%: 12.6 % — ABNORMAL LOW (ref 14.0–49.7)
MCH: 30.6 pg (ref 25.1–34.0)
MCHC: 32.9 g/dL (ref 31.5–36.0)
MONO#: 0.5 10*3/uL (ref 0.1–0.9)
Platelets: 184 10*3/uL (ref 145–400)
RBC: 4.08 10*6/uL (ref 3.70–5.45)
WBC: 3.5 10*3/uL — ABNORMAL LOW (ref 3.9–10.3)
lymph#: 0.4 10*3/uL — ABNORMAL LOW (ref 0.9–3.3)

## 2012-06-17 LAB — LACTATE DEHYDROGENASE (CC13): LDH: 317 U/L — ABNORMAL HIGH (ref 125–220)

## 2012-06-17 LAB — PROTIME-INR: Protime: 21.6 Seconds — ABNORMAL HIGH (ref 10.6–13.4)

## 2012-06-17 MED ORDER — BORTEZOMIB CHEMO IV INJECTION 3.5 MG
1.3000 mg/m2 | Freq: Once | INTRAMUSCULAR | Status: AC
  Administered 2012-06-17: 2 mg via INTRAVENOUS
  Filled 2012-06-17: qty 2

## 2012-06-17 MED ORDER — ONDANSETRON 8 MG/50ML IVPB (CHCC)
8.0000 mg | Freq: Once | INTRAVENOUS | Status: AC
  Administered 2012-06-17: 8 mg via INTRAVENOUS

## 2012-06-17 NOTE — Progress Notes (Signed)
INR subtherapeutic again (1.8) despite dose increase last week to 5mg  daily except 2.5mg  on Wed. No missed doses.  No changes in meds or diet.  She had 1 small serving of greens on Sunday.  No problems with bleeding or bruising.  Will increase dose today to 5mg  daily.  Recheck INR in 1 week.

## 2012-06-17 NOTE — Progress Notes (Signed)
[facility] [phone]   [phone]  OFFICE PROGRESS NOTE  [name], MD [address] [place] [address]  DIAGNOSIS:  1. multiple myeloma diagnosed in September 2009,  2. history of bilateral lower extremity deep vein thrombosis diagnosed in November 2009  3. acute on chronic deep vein thrombosis in the left lower extremity diagnosed in October 2010.  PRIOR THERAPY:  1. status post palliative radiotherapy to the right femur and is she'll tuberosity, first was done in December 2011 and the second treatment was completed Jan 08 2011, and the third treatment to the right distal femur completed on 04/30/2011  2. status post palliative radiotherapy to the left proximal femur completed 02/28/2011.  3. Revlimid 25 mg by mouth daily for 21 days every 4 weeks in addition to oral Decadron at 40 mg by mouth on a weekly basis status post 37 cycles.   CURRENT THERAPY:  1. weekly subcutaneous Velcade 1.3 mg/M2 status post 30 cycles.  2. Coumadin 5 mg on Mondays and Thursdays and 2.5 mg on all other days.  3. Zometa 4 mg IV given every 2 months.  INTERVAL HISTORY: [name] 76 y.o. female returns to the clinic today for followup visit accompanied her daughter. She is having some postherpetic neuralgia type pain and has been placed on Neurontin. She finds on the Neurontin helpful for the pain. She reports that the discomfort is diminishing somewhat. She recently had a chest x-ray that revealed a pneumonia and she is completing a course of Avelox. She continues to have a "wet sounding" cough and is able to cough up the secretions. She denied any fever or chills. She is having some constipation but is managing this well with over-the-counter preparations. She voiced no other specific complaints today. The patient is tolerating her treatment with subcutaneous weekly Velcade fairly well. She has no significant adverse effects. He denied having any  significant peripheral neuropathy, no weight loss or night sweats. She has no chest pain or shortness of breath.   MEDICAL HISTORY: Past Medical History  Diagnosis Date  . Blood transfusion   . Depression     Mild  . History of chicken pox   . Hyperlipidemia   . Hypertension   . Degenerative joint disease   . Diabetes mellitus     Steroid related  . Phlebitis   . Multiple myeloma(203.0)     per Dr. [name], s/p palliative radiation for leg pain 2011 per Dr. Roselind Messier  . Deep vein thrombosis of bilateral lower extremities     ALLERGIES:   has no known allergies.  MEDICATIONS:  Current Outpatient Prescriptions  Medication Sig Dispense Refill  . ALPRAZolam (XANAX) 0.5 MG tablet TAKE ONE TABLET BY MOUTH THREE TIMES DAILY AS NEEDED FOR ANXIETY  60 tablet  0  . Calcium Carbonate-Vitamin D 600-400 MG-UNIT per tablet Take 1 tablet by mouth 3 (three) times daily with meals.        Marland Kitchen dexamethasone (DECADRON) 4 MG tablet TAKE 10 TABLETS BY MOUTH ONCE PER WEEK WITH FOOD AS DIRECTED  40 tablet  3  . gabapentin (NEURONTIN) 100 MG capsule Add on 1 pill of gabapentin a day to control pain. Take up to 12 pills in a day (up to 4 pills at a time, 3 times a day).      Marland Kitchen glipiZIDE (GLUCOTROL XL) 2.5 MG 24 hr tablet Take 1 tablet (2.5 mg total) by mouth daily.  90 tablet  2  . glucose blood (ONE TOUCH ULTRA TEST) test strip Use as instructed  25 each  6  . ibuprofen (ADVIL,MOTRIN) 200 MG tablet Take 200 mg by mouth every 6 (six) hours as needed.        Marland Kitchen KLOR-CON M20 20 MEQ tablet TAKE ONE TABLET BY MOUTH EVERY DAY  30 each  1  . Lancets (ONETOUCH ULTRASOFT) lancets Test blood sugar once daily or as directed.  100 each  11  . lidocaine (LIDODERM) 5 % Place 1 patch onto the skin daily. Remove & Discard patch within 12 hours or as directed by MD  30 patch  0  . lisinopril-hydrochlorothiazide (PRINZIDE,ZESTORETIC) 20-12.5 MG per tablet Take 0.5 tablets by mouth daily.       . magnesium oxide (MAG-OX) 400 MG  tablet Take 400 mg by mouth daily.       Marland Kitchen oxyCODONE (OXY IR/ROXICODONE) 5 MG immediate release tablet Take 1 1/2 tabs by mouth every 6 hours as needed for pain  90 tablet  0  . oxyCODONE (OXYCONTIN) 10 MG 12 hr tablet Take 1 tablet (10 mg total) by mouth every 12 (twelve) hours.  60 tablet  0  . prochlorperazine (COMPAZINE) 10 MG tablet       . temazepam (RESTORIL) 30 MG capsule TAKE ONE CAPSULE BY MOUTH AT BEDTIME AS NEEDED FOR SLEEP  30 capsule  0  . warfarin (COUMADIN) 5 MG tablet TAKE ONE TABLET BY MOUTH ONCE DAILY ON TUESDAY, THURSDAY AND SATURDAY, TAKE 1/2 TABLET ON ALL OTHER DAYS OR AS DIRECTED  60 tablet  1   No current facility-administered medications for this visit.   Facility-Administered Medications Ordered in Other Visits  Medication Dose Route Frequency Provider Last Rate Last Dose  . bortezomib IV (VELCADE) chemo injection 2 mg  1.3 mg/m2 (Treatment Plan Actual) Intravenous Once Si Gaul, MD   2 mg at 06/17/12 1451  . ondansetron (ZOFRAN) IVPB 8 mg  8 mg Intravenous Once Si Gaul, MD   8 mg at 06/17/12 1434  . Zoledronic Acid (ZOMETA) 4 mg IVPB  4 mg Intravenous Once Si Gaul, MD        SURGICAL HISTORY:  Past Surgical History  Procedure Date  . Abdominal hysterectomy     REVIEW OF SYSTEMS:  A comprehensive review of systems was negative except for: Musculoskeletal: positive for arthralgias and bone pain Neurological: positive for Postherpetic neuralgia pain Recent herpes zoster outbreak affecting the left anterior thigh and left sacral area   PHYSICAL EXAMINATION: General appearance: alert, cooperative and no distress Head: Normocephalic, without obvious abnormality, atraumatic Neck: no adenopathy Lymph nodes: Cervical, supraclavicular, and axillary nodes normal. Resp: clear to auscultation bilaterally Cardio: regular rate and rhythm, S1, S2 normal, no murmur, click, rub or gallop GI: soft, non-tender; bowel sounds normal; no masses,  no  organomegaly Extremities: extremities normal, atraumatic, no cyanosis or edema Skin: Eschar formation on the previously this acute or lesions on the left anterior thigh and left sacral area. Currently there are no active vesicles. There is no evidence of any superinfection.  ECOG PERFORMANCE STATUS: 1 - Symptomatic but completely ambulatory  Blood pressure 127/71, pulse 79, temperature 98.7 F (37.1 C), temperature source Oral, resp. rate 18, height 5' (1.524 m), weight 125 lb 12.8 oz (57.063 kg).  LABORATORY DATA: Lab Results  Component Value Date   WBC 3.5* 06/17/2012   HGB 12.5 06/17/2012   HCT 38.0 06/17/2012   MCV 93.1 06/17/2012   PLT 184 06/17/2012  Chemistry      Component Value Date/Time   NA 142 06/17/2012 1346   NA 141 04/22/2012 1421   K 3.8 06/17/2012 1346   K 3.7 04/22/2012 1421   CL 105 06/17/2012 1346   CL 102 04/22/2012 1421   CO2 27 06/17/2012 1346   CO2 31 04/22/2012 1421   BUN 15.0 06/17/2012 1346   BUN 16 04/22/2012 1421   CREATININE 0.8 06/17/2012 1346   CREATININE 1.02 04/22/2012 1421      Component Value Date/Time   CALCIUM 9.6 06/17/2012 1346   CALCIUM 9.9 04/22/2012 1421   CALCIUM  Value: 5.7 CRITICAL RESULT CALLED TO, READ BACK BY AND VERIFIED WITH: JAMIE TRACY,RN [id number] @ 1433 BY J SCOTTON (NOTE)  Amended report. Result repeated and verified. CORRECTED ON 10/14 AT 1429: PREVIOUSLY REPORTED AS Result repeated and verified.* 06/13/2008 1605   ALKPHOS 50 06/17/2012 1346   ALKPHOS 43 04/22/2012 1421   AST 20 06/17/2012 1346   AST 18 04/22/2012 1421   ALT 16 06/17/2012 1346   ALT 14 04/22/2012 1421   BILITOT 0.30 06/17/2012 1346   BILITOT 0.3 04/22/2012 1421       RADIOGRAPHIC STUDIES: No results found.  ASSESSMENTPLAN: This is a very pleasant 76 years old [place] American female with history of multiple myeloma currently on treatment with subcutaneous weekly Velcade and tolerating it fairly well. She has no evidence for disease progression on the  recent myeloma panel.Patient was discussed Dr. [name]. She will continue on her weekly Velcade as scheduled. She will continue on her current pain medications. Her recent herpes zoster outbreak affecting the left anterior thigh and left sacral region reveals no active lesions. Unfortunately now she is experiencing some postherpetic neuralgia type pain but isn't getting relief with Neurontin.  She'll return in 3 weeks for another symptom management visit with another CBC differential, C. met and LDH. We will also obtain a followup chest x-ray to be sure that her pneumonia has resolved and that there are no underlying masses.  Laural Benes, Cheralyn Oliver E, PA-C   All questions were answered. The patient knows to call the clinic with any problems, questions or concerns. We can certainly see the patient much sooner if necessary.  I spent 20 minutes counseling the patient face to face. The total time spent in the appointment was 30 minutes.            [facility] [phone]   [phone]) [phone]  OFFICE PROGRESS NOTE  [name], MD [address] [place] [address]  DIAGNOSIS:  1. multiple myeloma diagnosed in September 2009,  2. history of bilateral lower extremity deep vein thrombosis diagnosed in November 2009  3. acute on chronic deep vein thrombosis in the left lower extremity diagnosed in October 2010.  PRIOR THERAPY:  1. status post palliative radiotherapy to the right femur and is she'll tuberosity, first was done in December 2011 and the second treatment was completed Jan 08 2011, and the third treatment to the right distal femur completed on 04/30/2011  2. status post palliative radiotherapy to the left proximal femur completed 02/28/2011.  3. Revlimid 25 mg by mouth daily for 21 days every 4 weeks in addition to oral Decadron at 40 mg by mouth on a weekly basis status post 37 cycles.   CURRENT THERAPY:  1. weekly subcutaneous Velcade 1.3 mg/M2  status post 30 cycles.  2. Coumadin 5 mg on Mondays and Thursdays and 2.5 mg on all other days.  3. Zometa 4 mg IV given every 2 months.  INTERVAL HISTORY: [name] 76 y.o. female returns to the clinic today for followup visit accompanied her daughter. She continues to have some postherpetic neuralgia type pain and has been placed on Neurontin. She finds on the Neurontin helpful for the pain. Today she feels generally well and voices no specific complaints. The patient is tolerating her treatment with weekly Velcade fairly well. She has no significant adverse effects. She denied having any significant peripheral neuropathy, no weight loss or night sweats. She has no chest pain or shortness of breath.   MEDICAL HISTORY: Past Medical History  Diagnosis Date  . Blood transfusion   . Depression     Mild  . History of chicken pox   . Hyperlipidemia   . Hypertension   . Degenerative joint disease   . Diabetes mellitus     Steroid related  . Phlebitis   . Multiple myeloma(203.0)     per Dr. [name], s/p palliative radiation for leg pain 2011 per Dr. Roselind Messier  . Deep vein thrombosis of bilateral lower extremities     ALLERGIES:   has no known allergies.  MEDICATIONS:  Current Outpatient Prescriptions  Medication Sig Dispense Refill  . ALPRAZolam (XANAX) 0.5 MG tablet TAKE ONE TABLET BY MOUTH THREE TIMES DAILY AS NEEDED FOR ANXIETY  60 tablet  0  . Calcium Carbonate-Vitamin D 600-400 MG-UNIT per tablet Take 1 tablet by mouth 3 (three) times daily with meals.        Marland Kitchen dexamethasone (DECADRON) 4 MG tablet TAKE 10 TABLETS BY MOUTH ONCE PER WEEK WITH FOOD AS DIRECTED  40 tablet  3  . gabapentin (NEURONTIN) 100 MG capsule Add on 1 pill of gabapentin a day to control pain. Take up to 12 pills in a day (up to 4 pills at a time, 3 times a day).      Marland Kitchen glipiZIDE (GLUCOTROL XL) 2.5 MG 24 hr tablet Take 1 tablet (2.5 mg total) by mouth daily.  90 tablet  2  . glucose blood (ONE TOUCH ULTRA TEST) test  strip Use as instructed  25 each  6  . ibuprofen (ADVIL,MOTRIN) 200 MG tablet Take 200 mg by mouth every 6 (six) hours as needed.        Marland Kitchen KLOR-CON M20 20 MEQ tablet TAKE ONE TABLET BY MOUTH EVERY DAY  30 each  1  . Lancets (ONETOUCH ULTRASOFT) lancets Test blood sugar once daily or as directed.  100 each  11  . lidocaine (LIDODERM) 5 % Place 1 patch onto the skin daily. Remove & Discard patch within 12 hours or as directed by MD  30 patch  0  . lisinopril-hydrochlorothiazide (PRINZIDE,ZESTORETIC) 20-12.5 MG per tablet Take 0.5 tablets by mouth daily.       . magnesium oxide (MAG-OX) 400 MG tablet Take 400 mg by mouth daily.       Marland Kitchen oxyCODONE (OXY IR/ROXICODONE) 5 MG immediate release tablet Take 1 1/2 tabs by mouth every 6 hours as needed for pain  90 tablet  0  . oxyCODONE (OXYCONTIN) 10 MG 12 hr tablet Take 1 tablet (10 mg total) by mouth every 12 (twelve) hours.  60 tablet  0  . prochlorperazine (COMPAZINE) 10 MG tablet       . temazepam (RESTORIL) 30 MG capsule TAKE ONE CAPSULE BY MOUTH AT BEDTIME AS NEEDED FOR SLEEP  30 capsule  0  . warfarin (COUMADIN) 5 MG tablet TAKE ONE TABLET BY MOUTH  ONCE DAILY ON TUESDAY, THURSDAY AND SATURDAY, TAKE 1/2 TABLET ON ALL OTHER DAYS OR AS DIRECTED  60 tablet  1   No current facility-administered medications for this visit.   Facility-Administered Medications Ordered in Other Visits  Medication Dose Route Frequency Provider Last Rate Last Dose  . bortezomib IV (VELCADE) chemo injection 2 mg  1.3 mg/m2 (Treatment Plan Actual) Intravenous Once Si Gaul, MD   2 mg at 06/17/12 1451  . ondansetron (ZOFRAN) IVPB 8 mg  8 mg Intravenous Once Si Gaul, MD   8 mg at 06/17/12 1434  . Zoledronic Acid (ZOMETA) 4 mg IVPB  4 mg Intravenous Once Si Gaul, MD        SURGICAL HISTORY:  Past Surgical History  Procedure Date  . Abdominal hysterectomy     REVIEW OF SYSTEMS:  A comprehensive review of systems was negative except for:  Musculoskeletal: positive for arthralgias and bone pain Neurological: positive for Postherpetic neuralgia pain Recent herpes zoster outbreak affecting the left anterior thigh and left sacral area   PHYSICAL EXAMINATION: General appearance: alert, cooperative and no distress Head: Normocephalic, without obvious abnormality, atraumatic Neck: no adenopathy Lymph nodes: Cervical, supraclavicular, and axillary nodes normal. Resp: clear to auscultation bilaterally Cardio: regular rate and rhythm, S1, S2 normal, no murmur, click, rub or gallop GI: soft, non-tender; bowel sounds normal; no masses,  no organomegaly Extremities: extremities normal, atraumatic, no cyanosis or edema   ECOG PERFORMANCE STATUS: 1 - Symptomatic but completely ambulatory  Blood pressure 127/71, pulse 79, temperature 98.7 F (37.1 C), temperature source Oral, resp. rate 18, height 5' (1.524 m), weight 125 lb 12.8 oz (57.063 kg).  LABORATORY DATA: Lab Results  Component Value Date   WBC 3.5* 06/17/2012   HGB 12.5 06/17/2012   HCT 38.0 06/17/2012   MCV 93.1 06/17/2012   PLT 184 06/17/2012      Chemistry      Component Value Date/Time   NA 142 06/17/2012 1346   NA 141 04/22/2012 1421   K 3.8 06/17/2012 1346   K 3.7 04/22/2012 1421   CL 105 06/17/2012 1346   CL 102 04/22/2012 1421   CO2 27 06/17/2012 1346   CO2 31 04/22/2012 1421   BUN 15.0 06/17/2012 1346   BUN 16 04/22/2012 1421   CREATININE 0.8 06/17/2012 1346   CREATININE 1.02 04/22/2012 1421      Component Value Date/Time   CALCIUM 9.6 06/17/2012 1346   CALCIUM 9.9 04/22/2012 1421   CALCIUM  Value: 5.7 CRITICAL RESULT CALLED TO, READ BACK BY AND VERIFIED WITH: JAMIE TRACY,RN [id number] @ 1433 BY J SCOTTON (NOTE)  Amended report. Result repeated and verified. CORRECTED ON 10/14 AT 1429: PREVIOUSLY REPORTED AS Result repeated and verified.* 06/13/2008 1605   ALKPHOS 50 06/17/2012 1346   ALKPHOS 43 04/22/2012 1421   AST 20 06/17/2012 1346   AST 18 04/22/2012 1421    ALT 16 06/17/2012 1346   ALT 14 04/22/2012 1421   BILITOT 0.30 06/17/2012 1346   BILITOT 0.3 04/22/2012 1421       RADIOGRAPHIC STUDIES: No results found.  ASSESSMENTPLAN: This is a very pleasant 76 years old [place] American female with history of multiple myeloma currently on treatment with subcutaneous weekly Velcade and tolerating it fairly well. She has no evidence for disease progression on the recent myeloma panel.Patient was discussed Dr. [name]. She will continue on her weekly Velcade as scheduled. She will continue on her current pain medications.  She'll return in 3 weeks for another  symptom management visit with another CBC differential, C. met and LDH. We will also obtain a followup chest x-ray to be sure that her pneumonia has resolved and that there are no underlying masses.  Laural Benes, Anelis Hrivnak E, PA-C   All questions were answered. The patient knows to call the clinic with any problems, questions or concerns. We can certainly see the patient much sooner if necessary.  I spent 20 minutes counseling the patient face to face. The total time spent in the appointment was 30 minutes.

## 2012-06-17 NOTE — Patient Instructions (Addendum)
[facility] Discharge Instructions for Patients Receiving Chemotherapy  Today you received the following chemotherapy agents velcade  To help prevent nausea and vomiting after your treatment, we encourage you to take your nausea medication If you develop nausea and vomiting that is not controlled by your nausea medication, call the clinic. If it is after clinic hours your family physician or the after hours number for the clinic or go to the Emergency Department.   BELOW ARE SYMPTOMS THAT SHOULD BE REPORTED IMMEDIATELY:  *FEVER GREATER THAN 100.5 F  *CHILLS WITH OR WITHOUT FEVER  NAUSEA AND VOMITING THAT IS NOT CONTROLLED WITH YOUR NAUSEA MEDICATION  *UNUSUAL SHORTNESS OF BREATH  *UNUSUAL BRUISING OR BLEEDING  TENDERNESS IN MOUTH AND THROAT WITH OR WITHOUT PRESENCE OF ULCERS  *URINARY PROBLEMS  *BOWEL PROBLEMS  UNUSUAL RASH Items with * indicate a potential emergency and should be followed up as soon as possible.  One of the nurses will contact you 24 hours after your treatment. Please let the nurse know about any problems that you may have experienced. Feel free to call the clinic you have any questions or concerns. The clinic phone number is [phone].   I have been informed and understand all the instructions given to me. I know to contact the clinic, my physician, or go to the Emergency Department if any problems should occur. I do not have any questions at this time, but understand that I may call the clinic during office hours   should I have any questions or need assistance in obtaining follow up care.    __________________________________________  _____________  __________ Signature of Patient or Authorized Representative            Date                   Time    __________________________________________ Nurse's Signature

## 2012-06-24 ENCOUNTER — Other Ambulatory Visit (HOSPITAL_BASED_OUTPATIENT_CLINIC_OR_DEPARTMENT_OTHER): Payer: Medicare Other | Admitting: Lab

## 2012-06-24 ENCOUNTER — Other Ambulatory Visit: Payer: Self-pay | Admitting: Internal Medicine

## 2012-06-24 ENCOUNTER — Ambulatory Visit (HOSPITAL_BASED_OUTPATIENT_CLINIC_OR_DEPARTMENT_OTHER): Payer: Medicare Other

## 2012-06-24 ENCOUNTER — Ambulatory Visit: Payer: Medicare Other | Admitting: Pharmacist

## 2012-06-24 VITALS — BP 106/69 | HR 75 | Temp 98.4°F | Resp 18

## 2012-06-24 DIAGNOSIS — C9000 Multiple myeloma not having achieved remission: Secondary | ICD-10-CM

## 2012-06-24 DIAGNOSIS — I82409 Acute embolism and thrombosis of unspecified deep veins of unspecified lower extremity: Secondary | ICD-10-CM

## 2012-06-24 DIAGNOSIS — Z5112 Encounter for antineoplastic immunotherapy: Secondary | ICD-10-CM

## 2012-06-24 LAB — COMPREHENSIVE METABOLIC PANEL
AST: 19 U/L (ref 0–37)
Alkaline Phosphatase: 50 U/L (ref 39–117)
BUN: 15 mg/dL (ref 6–23)
Glucose, Bld: 79 mg/dL (ref 70–99)
Sodium: 141 mEq/L (ref 135–145)
Total Bilirubin: 0.2 mg/dL — ABNORMAL LOW (ref 0.3–1.2)

## 2012-06-24 LAB — PROTIME-INR: INR: 2.3 (ref 2.00–3.50)

## 2012-06-24 LAB — CBC WITH DIFFERENTIAL/PLATELET
Basophils Absolute: 0 10*3/uL (ref 0.0–0.1)
EOS%: 1.3 % (ref 0.0–7.0)
Eosinophils Absolute: 0.1 10*3/uL (ref 0.0–0.5)
HGB: 12.2 g/dL (ref 11.6–15.9)
NEUT#: 3.6 10*3/uL (ref 1.5–6.5)
RDW: 14.8 % — ABNORMAL HIGH (ref 11.2–14.5)
lymph#: 0.4 10*3/uL — ABNORMAL LOW (ref 0.9–3.3)

## 2012-06-24 MED ORDER — ONDANSETRON 8 MG/50ML IVPB (CHCC)
8.0000 mg | Freq: Once | INTRAVENOUS | Status: AC
  Administered 2012-06-24: 8 mg via INTRAVENOUS

## 2012-06-24 MED ORDER — SODIUM CHLORIDE 0.9 % IV SOLN
Freq: Once | INTRAVENOUS | Status: AC
  Administered 2012-06-24: 16:00:00 via INTRAVENOUS

## 2012-06-24 MED ORDER — BORTEZOMIB CHEMO IV INJECTION 3.5 MG
1.3000 mg/m2 | Freq: Once | INTRAMUSCULAR | Status: AC
  Administered 2012-06-24: 2 mg via INTRAVENOUS
  Filled 2012-06-24: qty 2

## 2012-06-24 NOTE — Progress Notes (Signed)
Quick Note:  Call patient with the result and order K Dur 20 meq po qd X 7 ______ 

## 2012-06-24 NOTE — Patient Instructions (Addendum)
[facility] Discharge Instructions for Patients Receiving Chemotherapy  Today you received the following chemotherapy agents velcade  To help prevent nausea and vomiting after your treatment, we encourage you to take your nausea medication and take it as often as prescribed   If you develop nausea and vomiting that is not controlled by your nausea medication, call the clinic. If it is after clinic hours your family physician or the after hours number for the clinic or go to the Emergency Department.   BELOW ARE SYMPTOMS THAT SHOULD BE REPORTED IMMEDIATELY:  *FEVER GREATER THAN 100.5 F  *CHILLS WITH OR WITHOUT FEVER  NAUSEA AND VOMITING THAT IS NOT CONTROLLED WITH YOUR NAUSEA MEDICATION  *UNUSUAL SHORTNESS OF BREATH  *UNUSUAL BRUISING OR BLEEDING  TENDERNESS IN MOUTH AND THROAT WITH OR WITHOUT PRESENCE OF ULCERS  *URINARY PROBLEMS  *BOWEL PROBLEMS  UNUSUAL RASH Items with * indicate a potential emergency and should be followed up as soon as possible.  One of the nurses will contact you 24 hours after your treatment. Please let the nurse know about any problems that you may have experienced. Feel free to call the clinic you have any questions or concerns. The clinic phone number is [phone].   I have been informed and understand all the instructions given to me. I know to contact the clinic, my physician, or go to the Emergency Department if any problems should occur. I do not have any questions at this time, but understand that I may call the clinic during office hours   should I have any questions or need assistance in obtaining follow up care.    __________________________________________  _____________  __________ Signature of Patient or Authorized Representative            Date                   Time    __________________________________________ Nurse's Signature    

## 2012-06-25 ENCOUNTER — Other Ambulatory Visit: Payer: Self-pay | Admitting: Certified Registered Nurse Anesthetist

## 2012-06-25 ENCOUNTER — Other Ambulatory Visit: Payer: Self-pay | Admitting: *Deleted

## 2012-06-25 ENCOUNTER — Telehealth: Payer: Self-pay | Admitting: *Deleted

## 2012-06-25 MED ORDER — TEMAZEPAM 30 MG PO CAPS: 30.0000 mg | ORAL_CAPSULE | Freq: Every evening | ORAL | Status: DC | PRN

## 2012-06-25 MED ORDER — POTASSIUM CHLORIDE CRYS ER 20 MEQ PO TBCR: 20.0000 meq | EXTENDED_RELEASE_TABLET | Freq: Every day | ORAL | Status: DC

## 2012-06-25 NOTE — Telephone Encounter (Signed)
Left msg on him's cell phone regarding k x 7 days.

## 2012-06-25 NOTE — Telephone Encounter (Signed)
Message copied by Caren Griffins on Fri Jun 25, 2012 11:03 AM ------      Message from: Si Gaul      Created: Thu Jun 24, 2012  9:09 PM       Call patient with the result and order K Dur 20 meq po qd X 7

## 2012-06-28 ENCOUNTER — Other Ambulatory Visit: Payer: Self-pay | Admitting: Internal Medicine

## 2012-06-28 DIAGNOSIS — E876 Hypokalemia: Secondary | ICD-10-CM

## 2012-06-28 MED ORDER — POTASSIUM CHLORIDE CRYS ER 20 MEQ PO TBCR: 20.0000 meq | EXTENDED_RELEASE_TABLET | Freq: Every day | ORAL | Status: DC

## 2012-06-28 NOTE — Telephone Encounter (Signed)
Called in kdur and left voice mail for [employer]

## 2012-06-28 NOTE — Telephone Encounter (Signed)
Message copied by Charma Igo on Mon Jun 28, 2012 11:01 AM ------      Message from: Si Gaul      Created: Thu Jun 24, 2012  9:09 PM       Call patient with the result and order K Dur 20 meq po qd X 7

## 2012-07-01 ENCOUNTER — Telehealth: Payer: Self-pay | Admitting: Internal Medicine

## 2012-07-01 ENCOUNTER — Ambulatory Visit (HOSPITAL_BASED_OUTPATIENT_CLINIC_OR_DEPARTMENT_OTHER): Payer: Medicare Other | Admitting: Physician Assistant

## 2012-07-01 ENCOUNTER — Encounter: Payer: Self-pay | Admitting: Physician Assistant

## 2012-07-01 ENCOUNTER — Other Ambulatory Visit (HOSPITAL_BASED_OUTPATIENT_CLINIC_OR_DEPARTMENT_OTHER): Payer: Medicare Other | Admitting: Lab

## 2012-07-01 ENCOUNTER — Ambulatory Visit (HOSPITAL_BASED_OUTPATIENT_CLINIC_OR_DEPARTMENT_OTHER): Payer: Medicare Other

## 2012-07-01 ENCOUNTER — Ambulatory Visit (HOSPITAL_COMMUNITY)
Admission: RE | Admit: 2012-07-01 | Discharge: 2012-07-01 | Disposition: A | Discharge status: Home / Self Care | Payer: Medicare Other | Source: Ambulatory Visit | Attending: Physician Assistant | Admitting: Physician Assistant

## 2012-07-01 ENCOUNTER — Ambulatory Visit: Payer: Medicare Other | Admitting: Pharmacist

## 2012-07-01 VITALS — BP 128/73 | HR 66 | Temp 98.0°F | Resp 20 | Ht 60.0 in | Wt 124.9 lb

## 2012-07-01 DIAGNOSIS — E119 Type 2 diabetes mellitus without complications: Secondary | ICD-10-CM | POA: Insufficient documentation

## 2012-07-01 DIAGNOSIS — I10 Essential (primary) hypertension: Secondary | ICD-10-CM | POA: Insufficient documentation

## 2012-07-01 DIAGNOSIS — Z5112 Encounter for antineoplastic immunotherapy: Secondary | ICD-10-CM

## 2012-07-01 DIAGNOSIS — C9000 Multiple myeloma not having achieved remission: Secondary | ICD-10-CM

## 2012-07-01 DIAGNOSIS — Z87891 Personal history of nicotine dependence: Secondary | ICD-10-CM | POA: Insufficient documentation

## 2012-07-01 DIAGNOSIS — I82409 Acute embolism and thrombosis of unspecified deep veins of unspecified lower extremity: Secondary | ICD-10-CM

## 2012-07-01 LAB — CBC WITH DIFFERENTIAL/PLATELET
Basophils Absolute: 0 10*3/uL (ref 0.0–0.1)
EOS%: 1.1 % (ref 0.0–7.0)
Eosinophils Absolute: 0 10*3/uL (ref 0.0–0.5)
HGB: 12.6 g/dL (ref 11.6–15.9)
MCH: 30.9 pg (ref 25.1–34.0)
NEUT#: 3 10*3/uL (ref 1.5–6.5)
RBC: 4.08 10*6/uL (ref 3.70–5.45)
RDW: 15.2 % — ABNORMAL HIGH (ref 11.2–14.5)
lymph#: 0.4 10*3/uL — ABNORMAL LOW (ref 0.9–3.3)

## 2012-07-01 LAB — COMPREHENSIVE METABOLIC PANEL (CC13)
ALT: 17 U/L (ref 0–55)
AST: 19 U/L (ref 5–34)
Alkaline Phosphatase: 52 U/L (ref 40–150)
Calcium: 10 mg/dL (ref 8.4–10.4)
Chloride: 104 mEq/L (ref 98–107)
Creatinine: 0.9 mg/dL (ref 0.6–1.1)

## 2012-07-01 LAB — PROTIME-INR
INR: 3 (ref 2.00–3.50)
Protime: 36 Seconds — ABNORMAL HIGH (ref 10.6–13.4)

## 2012-07-01 LAB — POCT INR: INR: 3

## 2012-07-01 MED ORDER — BORTEZOMIB CHEMO IV INJECTION 3.5 MG
1.3000 mg/m2 | Freq: Once | INTRAMUSCULAR | Status: AC
  Administered 2012-07-01: 2 mg via INTRAVENOUS
  Filled 2012-07-01: qty 2

## 2012-07-01 MED ORDER — SODIUM CHLORIDE 0.9 % IV SOLN
Freq: Once | INTRAVENOUS | Status: AC
  Administered 2012-07-01: 14:00:00 via INTRAVENOUS

## 2012-07-01 MED ORDER — ONDANSETRON 8 MG/50ML IVPB (CHCC)
8.0000 mg | Freq: Once | INTRAVENOUS | Status: AC
  Administered 2012-07-01: 8 mg via INTRAVENOUS

## 2012-07-01 MED ORDER — DEXAMETHASONE 4 MG PO TABS: ORAL_TABLET | ORAL | Status: DC

## 2012-07-01 MED ORDER — ALPRAZOLAM 0.5 MG PO TABS: 0.5000 mg | ORAL_TABLET | Freq: Three times a day (TID) | ORAL | Status: DC | PRN

## 2012-07-01 NOTE — Patient Instructions (Addendum)
Continue with your weekly chemotherapy as scheduled. He'll have a followup chest x-ray performed today. Followup with Dr. [name] in 3 weeks with restaging protein studies

## 2012-07-01 NOTE — Progress Notes (Signed)
[facility] Telephone:([phone]) [phone]   [phone]) [phone]  OFFICE PROGRESS NOTE  [name], MD [address] [place] [address]  DIAGNOSIS:  1. multiple myeloma diagnosed in September 2009,  2. history of bilateral lower extremity deep vein thrombosis diagnosed in November 2009  3. acute on chronic deep vein thrombosis in the left lower extremity diagnosed in October 2010.  PRIOR THERAPY:  1. status post palliative radiotherapy to the right femur and is she'll tuberosity, first was done in December 2011 and the second treatment was completed Jan 08 2011, and the third treatment to the right distal femur completed on 04/30/2011  2. status post palliative radiotherapy to the left proximal femur completed 02/28/2011.  3. Revlimid 25 mg by mouth daily for 21 days every 4 weeks in addition to oral Decadron at 40 mg by mouth on a weekly basis status post 37 cycles.   CURRENT THERAPY:  1. weekly subcutaneous Velcade 1.3 mg/M2 status post 33 cycles.  2. Coumadin 5 mg on Mondays and Thursdays and 2.5 mg on all other days.  3. Zometa 4 mg IV given every 2 months.  INTERVAL HISTORY: Aleysia Oltmann 76 y.o. female returns to the clinic today for followup visit accompanied her daughters. Today she is feeling well and voices no specific complaints. She requests refills for her dexamethasone and her Xanax.  The patient is tolerating her treatment with subcutaneous weekly Velcade fairly well. She has no significant adverse effects. She denied having any significant peripheral neuropathy, no weight loss or night sweats. She has no chest pain or shortness of breath.   MEDICAL HISTORY: Past Medical History  Diagnosis Date  . Blood transfusion   . Depression     Mild  . History of chicken pox   . Hyperlipidemia   . Hypertension   . Degenerative joint disease   . Diabetes mellitus     Steroid related  . Phlebitis   . Multiple myeloma(203.0)     per Dr. [name], s/p  palliative radiation for leg pain 2011 per Dr. Roselind Messier  . Deep vein thrombosis of bilateral lower extremities     ALLERGIES:   has no known allergies.  MEDICATIONS:  Current Outpatient Prescriptions  Medication Sig Dispense Refill  . ALPRAZolam (XANAX) 0.5 MG tablet Take 1 tablet (0.5 mg total) by mouth 3 (three) times daily as needed for sleep.  60 tablet  0  . Calcium Carbonate-Vitamin D 600-400 MG-UNIT per tablet Take 1 tablet by mouth 3 (three) times daily with meals.        Marland Kitchen dexamethasone (DECADRON) 4 MG tablet Take 10 tablets by mouth once a week  40 tablet  3  . gabapentin (NEURONTIN) 100 MG capsule Add on 1 pill of gabapentin a day to control pain. Take up to 12 pills in a day (up to 4 pills at a time, 3 times a day).      Marland Kitchen glipiZIDE (GLUCOTROL XL) 2.5 MG 24 hr tablet Take 1 tablet (2.5 mg total) by mouth daily.  90 tablet  2  . glucose blood (ONE TOUCH ULTRA TEST) test strip Use as instructed  25 each  6  . ibuprofen (ADVIL,MOTRIN) 200 MG tablet Take 200 mg by mouth every 6 (six) hours as needed.        Marland Kitchen KLOR-CON M20 20 MEQ tablet TAKE ONE TABLET BY MOUTH EVERY DAY  30 each  1  . Lancets (ONETOUCH ULTRASOFT) lancets Test blood sugar  once daily or as directed.  100 each  11  . lidocaine (LIDODERM) 5 % Place 1 patch onto the skin daily. Remove & Discard patch within 12 hours or as directed by MD  30 patch  0  . lisinopril-hydrochlorothiazide (PRINZIDE,ZESTORETIC) 20-12.5 MG per tablet Take 0.5 tablets by mouth daily.       . magnesium oxide (MAG-OX) 400 MG tablet Take 400 mg by mouth daily.       Marland Kitchen oxyCODONE (OXY IR/ROXICODONE) 5 MG immediate release tablet Take 1 1/2 tabs by mouth every 6 hours as needed for pain  90 tablet  0  . oxyCODONE (OXYCONTIN) 10 MG 12 hr tablet Take 1 tablet (10 mg total) by mouth every 12 (twelve) hours.  60 tablet  0  . potassium chloride SA (K-DUR,KLOR-CON) 20 MEQ tablet Take 1 tablet (20 mEq total) by mouth daily.  7 tablet  0  . potassium chloride SA  (K-DUR,KLOR-CON) 20 MEQ tablet Take 1 tablet (20 mEq total) by mouth daily.  7 tablet  0  . prochlorperazine (COMPAZINE) 10 MG tablet       . temazepam (RESTORIL) 30 MG capsule Take 1 capsule (30 mg total) by mouth at bedtime as needed for sleep.  30 capsule  1  . warfarin (COUMADIN) 5 MG tablet TAKE ONE TABLET BY MOUTH ONCE DAILY ON TUESDAY, THURSDAY AND SATURDAY, TAKE 1/2 TABLET ON ALL OTHER DAYS OR AS DIRECTED  60 tablet  1   No current facility-administered medications for this visit.   Facility-Administered Medications Ordered in Other Visits  Medication Dose Route Frequency Provider Last Rate Last Dose  . 0.9 %  sodium chloride infusion   Intravenous Once Si Gaul, MD 20 mL/hr at 07/01/12 1335    . bortezomib IV (VELCADE) chemo injection 2 mg  1.3 mg/m2 (Treatment Plan Actual) Intravenous Once Si Gaul, MD      . ondansetron [facility]) IVPB 8 mg  8 mg Intravenous Once Si Gaul, MD   8 mg at 07/01/12 1339  . Zoledronic Acid (ZOMETA) 4 mg IVPB  4 mg Intravenous Once Si Gaul, MD        SURGICAL HISTORY:  Past Surgical History  Procedure Date  . Abdominal hysterectomy     REVIEW OF SYSTEMS:  A comprehensive review of systems was negative.   PHYSICAL EXAMINATION: General appearance: alert, cooperative and no distress Head: Normocephalic, without obvious abnormality, atraumatic Neck: no adenopathy Lymph nodes: Cervical, supraclavicular, and axillary nodes normal. Resp: clear to auscultation bilaterally Cardio: regular rate and rhythm, S1, S2 normal, no murmur, click, rub or gallop GI: soft, non-tender; bowel sounds normal; no masses,  no organomegaly Extremities: extremities normal, atraumatic, no cyanosis or edema   ECOG PERFORMANCE STATUS: 1 - Symptomatic but completely ambulatory  Blood pressure 128/73, pulse 66, temperature 98 F (36.7 C), temperature source Oral, resp. rate 20, height 5' (1.524 m), weight 124 lb 14.4 oz (56.654 kg).  LABORATORY  DATA: Lab Results  Component Value Date   WBC 3.8* 07/01/2012   HGB 12.6 07/01/2012   HCT 38.1 07/01/2012   MCV 93.4 07/01/2012   PLT 172 07/01/2012      Chemistry      Component Value Date/Time   NA 140 07/01/2012 1150   NA 141 06/24/2012 1620   K 4.0 07/01/2012 1150   K 3.1* 06/24/2012 1620   CL 104 07/01/2012 1150   CL 101 06/24/2012 1620   CO2 29 07/01/2012 1150   CO2 32 06/24/2012 1620  BUN 15.0 07/01/2012 1150   BUN 15 06/24/2012 1620   CREATININE 0.9 07/01/2012 1150   CREATININE 0.96 06/24/2012 1620      Component Value Date/Time   CALCIUM 10.0 07/01/2012 1150   CALCIUM 9.4 06/24/2012 1620   CALCIUM  Value: 5.7 CRITICAL RESULT CALLED TO, READ BACK BY AND VERIFIED WITH: JAMIE TRACY,RN [id number] @ 1433 BY J SCOTTON (NOTE)  Amended report. Result repeated and verified. CORRECTED ON 10/14 AT 1429: PREVIOUSLY REPORTED AS Result repeated and verified.* 06/13/2008 1605   ALKPHOS 52 07/01/2012 1150   ALKPHOS 50 06/24/2012 1620   AST 19 07/01/2012 1150   AST 19 06/24/2012 1620   ALT 17 07/01/2012 1150   ALT 15 06/24/2012 1620   BILITOT 0.36 07/01/2012 1150   BILITOT 0.2* 06/24/2012 1620       RADIOGRAPHIC STUDIES: No results found.  ASSESSMENTPLAN: This is a very pleasant 76 years old [place] American female with history of multiple myeloma currently on treatment with subcutaneous weekly Velcade and tolerating it fairly well. She has no evidence for disease progression on the last myeloma panel.Patient was discussed Dr. [name]. She will continue on her weekly Velcade as scheduled. Her dexamethasone and Xanax are both refilled. She will continue with her weekly Velcade and dexamethasone. We will draw followup protein studies in 2 weeks with a quantitative immunoglobin, beta 2 [employer], and serum light chains as well as a CBC differential, C. met and LDH and have her followup with Dr. [name] in 3 weeks to discuss the results of these restaging protein studies. She will  have a followup chest x-ray done today to followup with the pneumonia it to be sure that it has completely resolved and also to be sure that she has no chest masses related to her multiple myeloma.   Laural Benes, Zackeriah Kissler E, PA-C   All questions were answered. The patient knows to call the clinic with any problems, questions or concerns. We can certainly see the patient much sooner if necessary.  I spent 20 minutes counseling the patient face to face. The total time spent in the appointment was 30 minutes.

## 2012-07-01 NOTE — Telephone Encounter (Signed)
gv dtr appt schedule for November and December. Per dtr pt will go have cxr after chemo.

## 2012-07-01 NOTE — Progress Notes (Signed)
INR = 3 on 5 mg/day Doing fine.  No complaints. INR at goal.  No change to dose; repeat INR in 3 weeks. Marily Lente, Pharm.D.

## 2012-07-01 NOTE — Patient Instructions (Addendum)
[facility] Discharge Instructions for Patients Receiving Chemotherapy  Today you received the following chemotherapy agents Velcade.  To help prevent nausea and vomiting after your treatment, we encourage you to take your nausea medication as prescribed.   If you develop nausea and vomiting that is not controlled by your nausea medication, call the clinic. If it is after clinic hours your family physician or the after hours number for the clinic or go to the Emergency Department.   BELOW ARE SYMPTOMS THAT SHOULD BE REPORTED IMMEDIATELY:  *FEVER GREATER THAN 100.5 F  *CHILLS WITH OR WITHOUT FEVER  NAUSEA AND VOMITING THAT IS NOT CONTROLLED WITH YOUR NAUSEA MEDICATION  *UNUSUAL SHORTNESS OF BREATH  *UNUSUAL BRUISING OR BLEEDING  TENDERNESS IN MOUTH AND THROAT WITH OR WITHOUT PRESENCE OF ULCERS  *URINARY PROBLEMS  *BOWEL PROBLEMS  UNUSUAL RASH Items with * indicate a potential emergency and should be followed up as soon as possible.  One of the nurses will contact you 24 hours after your treatment. Please let the nurse know about any problems that you may have experienced. Feel free to call the clinic you have any questions or concerns. The clinic phone number is [phone].   I have been informed and understand all the instructions given to me. I know to contact the clinic, my physician, or go to the Emergency Department if any problems should occur. I do not have any questions at this time, but understand that I may call the clinic during office hours   should I have any questions or need assistance in obtaining follow up care.    __________________________________________  _____________  __________ Signature of Patient or Authorized Representative            Date                   Time    __________________________________________ Nurse's Signature    

## 2012-07-08 ENCOUNTER — Ambulatory Visit (HOSPITAL_BASED_OUTPATIENT_CLINIC_OR_DEPARTMENT_OTHER): Payer: Medicare Other

## 2012-07-08 ENCOUNTER — Other Ambulatory Visit (HOSPITAL_BASED_OUTPATIENT_CLINIC_OR_DEPARTMENT_OTHER): Payer: Medicare Other | Admitting: Lab

## 2012-07-08 VITALS — BP 159/75 | HR 62 | Temp 97.0°F | Resp 20

## 2012-07-08 DIAGNOSIS — Z5112 Encounter for antineoplastic immunotherapy: Secondary | ICD-10-CM

## 2012-07-08 DIAGNOSIS — C9000 Multiple myeloma not having achieved remission: Secondary | ICD-10-CM

## 2012-07-08 LAB — CBC WITH DIFFERENTIAL/PLATELET
Eosinophils Absolute: 0.1 10*3/uL (ref 0.0–0.5)
HCT: 37 % (ref 34.8–46.6)
LYMPH%: 10.6 % — ABNORMAL LOW (ref 14.0–49.7)
MONO#: 0.4 10*3/uL (ref 0.1–0.9)
NEUT#: 2.6 10*3/uL (ref 1.5–6.5)
NEUT%: 74.9 % (ref 38.4–76.8)
Platelets: 184 10*3/uL (ref 145–400)
WBC: 3.4 10*3/uL — ABNORMAL LOW (ref 3.9–10.3)
lymph#: 0.4 10*3/uL — ABNORMAL LOW (ref 0.9–3.3)

## 2012-07-08 LAB — COMPREHENSIVE METABOLIC PANEL (CC13)
ALT: 15 U/L (ref 0–55)
CO2: 29 mEq/L (ref 22–29)
Calcium: 9.3 mg/dL (ref 8.4–10.4)
Chloride: 105 mEq/L (ref 98–107)
Creatinine: 0.9 mg/dL (ref 0.6–1.1)
Glucose: 79 mg/dl (ref 70–99)
Sodium: 138 mEq/L (ref 136–145)
Total Bilirubin: 0.28 mg/dL (ref 0.20–1.20)
Total Protein: 6.2 g/dL — ABNORMAL LOW (ref 6.4–8.3)

## 2012-07-08 MED ORDER — ONDANSETRON 8 MG/50ML IVPB (CHCC)
8.0000 mg | Freq: Once | INTRAVENOUS | Status: AC
  Administered 2012-07-08: 8 mg via INTRAVENOUS

## 2012-07-08 MED ORDER — SODIUM CHLORIDE 0.9 % IV SOLN
Freq: Once | INTRAVENOUS | Status: AC
  Administered 2012-07-08: 15:00:00 via INTRAVENOUS

## 2012-07-08 MED ORDER — BORTEZOMIB CHEMO IV INJECTION 3.5 MG
1.3000 mg/m2 | Freq: Once | INTRAMUSCULAR | Status: AC
  Administered 2012-07-08: 2 mg via INTRAVENOUS
  Filled 2012-07-08: qty 2

## 2012-07-08 NOTE — Patient Instructions (Addendum)
[facility] Discharge Instructions for Patients Receiving Chemotherapy  Today you received the following chemotherapy agents Velcade.  To help prevent nausea and vomiting after your treatment, we encourage you to take your nausea medication If you develop nausea and vomiting that is not controlled by your nausea medication, call the clinic. If it is after clinic hours your family physician or the after hours number for the clinic or go to the Emergency Department.   BELOW ARE SYMPTOMS THAT SHOULD BE REPORTED IMMEDIATELY:  *FEVER GREATER THAN 100.5 F  *CHILLS WITH OR WITHOUT FEVER  NAUSEA AND VOMITING THAT IS NOT CONTROLLED WITH YOUR NAUSEA MEDICATION  *UNUSUAL SHORTNESS OF BREATH  *UNUSUAL BRUISING OR BLEEDING  TENDERNESS IN MOUTH AND THROAT WITH OR WITHOUT PRESENCE OF ULCERS  *URINARY PROBLEMS  *BOWEL PROBLEMS  UNUSUAL RASH Items with * indicate a potential emergency and should be followed up as soon as possible.  Feel free to call the clinic you have any questions or concerns. The clinic phone number is [phone].   I have been informed and understand all the instructions given to me. I know to contact the clinic, my physician, or go to the Emergency Department if any problems should occur. I do not have any questions at this time, but understand that I may call the clinic during office hours   should I have any questions or need assistance in obtaining follow up care.

## 2012-07-14 ENCOUNTER — Other Ambulatory Visit: Payer: Self-pay | Admitting: Medical Oncology

## 2012-07-14 DIAGNOSIS — C9000 Multiple myeloma not having achieved remission: Secondary | ICD-10-CM

## 2012-07-14 MED ORDER — OXYCODONE HCL 10 MG PO TB12: 10.0000 mg | ORAL_TABLET | Freq: Two times a day (BID) | ORAL | Status: DC

## 2012-07-14 MED ORDER — OXYCODONE HCL 5 MG PO TABS: ORAL_TABLET | ORAL | Status: DC

## 2012-07-14 NOTE — Telephone Encounter (Signed)
Daughter requests refill on pain meds. Given to Dr [name] for approval. Rx to injection room for pickup.

## 2012-07-15 ENCOUNTER — Other Ambulatory Visit (HOSPITAL_BASED_OUTPATIENT_CLINIC_OR_DEPARTMENT_OTHER): Payer: Medicare Other | Admitting: Lab

## 2012-07-15 ENCOUNTER — Ambulatory Visit (HOSPITAL_BASED_OUTPATIENT_CLINIC_OR_DEPARTMENT_OTHER): Payer: Medicare Other

## 2012-07-15 ENCOUNTER — Ambulatory Visit: Payer: Medicare Other | Admitting: Lab

## 2012-07-15 DIAGNOSIS — I82409 Acute embolism and thrombosis of unspecified deep veins of unspecified lower extremity: Secondary | ICD-10-CM

## 2012-07-15 DIAGNOSIS — C9000 Multiple myeloma not having achieved remission: Secondary | ICD-10-CM

## 2012-07-15 DIAGNOSIS — Z86718 Personal history of other venous thrombosis and embolism: Secondary | ICD-10-CM

## 2012-07-15 DIAGNOSIS — C3490 Malignant neoplasm of unspecified part of unspecified bronchus or lung: Secondary | ICD-10-CM

## 2012-07-15 DIAGNOSIS — Z5112 Encounter for antineoplastic immunotherapy: Secondary | ICD-10-CM

## 2012-07-15 LAB — COMPREHENSIVE METABOLIC PANEL (CC13)
AST: 20 U/L (ref 5–34)
Albumin: 3.1 g/dL — ABNORMAL LOW (ref 3.5–5.0)
Alkaline Phosphatase: 50 U/L (ref 40–150)
BUN: 19 mg/dL (ref 7.0–26.0)
Chloride: 107 mEq/L (ref 98–107)
Creatinine: 1 mg/dL (ref 0.6–1.1)
Glucose: 102 mg/dl — ABNORMAL HIGH (ref 70–99)
Potassium: 3.4 mEq/L — ABNORMAL LOW (ref 3.5–5.1)
Total Bilirubin: 0.26 mg/dL (ref 0.20–1.20)

## 2012-07-15 LAB — CBC WITH DIFFERENTIAL/PLATELET
BASO%: 0.3 % (ref 0.0–2.0)
EOS%: 1.7 % (ref 0.0–7.0)
MCH: 29.7 pg (ref 25.1–34.0)
MCHC: 32.6 g/dL (ref 31.5–36.0)
RBC: 4.17 10*6/uL (ref 3.70–5.45)
RDW: 14.1 % (ref 11.2–14.5)
lymph#: 0.4 10*3/uL — ABNORMAL LOW (ref 0.9–3.3)
nRBC: 0 % (ref 0–0)

## 2012-07-15 LAB — PROTIME-INR
INR: 3 (ref 2.00–3.50)
Protime: 36 Seconds — ABNORMAL HIGH (ref 10.6–13.4)

## 2012-07-15 MED ORDER — ONDANSETRON 8 MG/50ML IVPB (CHCC)
8.0000 mg | Freq: Once | INTRAVENOUS | Status: AC
  Administered 2012-07-15: 8 mg via INTRAVENOUS

## 2012-07-15 MED ORDER — SODIUM CHLORIDE 0.9 % IV SOLN
Freq: Once | INTRAVENOUS | Status: AC
  Administered 2012-07-15: 15:00:00 via INTRAVENOUS

## 2012-07-15 MED ORDER — BORTEZOMIB CHEMO IV INJECTION 3.5 MG
1.3000 mg/m2 | Freq: Once | INTRAMUSCULAR | Status: AC
  Administered 2012-07-15: 2 mg via INTRAVENOUS
  Filled 2012-07-15: qty 2

## 2012-07-15 NOTE — Patient Instructions (Addendum)
[facility] Discharge Instructions for Patients Receiving Chemotherapy  Today you received the following chemotherapy agents velcade  To help prevent nausea and vomiting after your treatment, we encourage you to take your nausea medication and take it as often as prescribed   If you develop nausea and vomiting that is not controlled by your nausea medication, call the clinic. If it is after clinic hours your family physician or the after hours number for the clinic or go to the Emergency Department.   BELOW ARE SYMPTOMS THAT SHOULD BE REPORTED IMMEDIATELY:  *FEVER GREATER THAN 100.5 F  *CHILLS WITH OR WITHOUT FEVER  NAUSEA AND VOMITING THAT IS NOT CONTROLLED WITH YOUR NAUSEA MEDICATION  *UNUSUAL SHORTNESS OF BREATH  *UNUSUAL BRUISING OR BLEEDING  TENDERNESS IN MOUTH AND THROAT WITH OR WITHOUT PRESENCE OF ULCERS  *URINARY PROBLEMS  *BOWEL PROBLEMS  UNUSUAL RASH Items with * indicate a potential emergency and should be followed up as soon as possible.  One of the nurses will contact you 24 hours after your treatment. Please let the nurse know about any problems that you may have experienced. Feel free to call the clinic you have any questions or concerns. The clinic phone number is [phone].   I have been informed and understand all the instructions given to me. I know to contact the clinic, my physician, or go to the Emergency Department if any problems should occur. I do not have any questions at this time, but understand that I may call the clinic during office hours   should I have any questions or need assistance in obtaining follow up care.    __________________________________________  _____________  __________ Signature of Patient or Authorized Representative            Date                   Time    __________________________________________ Nurse's Signature    

## 2012-07-19 ENCOUNTER — Other Ambulatory Visit: Payer: Self-pay | Admitting: *Deleted

## 2012-07-19 LAB — SPEP & IFE WITH QIG
Beta Globulin: 6.5 % (ref 4.7–7.2)
Gamma Globulin: 9.1 % — ABNORMAL LOW (ref 11.1–18.8)
IgA: 94 mg/dL (ref 69–380)
IgG (Immunoglobin G), Serum: 624 mg/dL — ABNORMAL LOW (ref 690–1700)
Total Protein, Serum Electrophoresis: 6.2 g/dL (ref 6.0–8.3)

## 2012-07-19 LAB — KAPPA/LAMBDA LIGHT CHAINS
Kappa free light chain: 0.65 mg/dL (ref 0.33–1.94)
Kappa:Lambda Ratio: 0.12 — ABNORMAL LOW (ref 0.26–1.65)

## 2012-07-19 MED ORDER — GABAPENTIN 100 MG PO CAPS: ORAL_CAPSULE | ORAL | Status: DC

## 2012-07-19 NOTE — Telephone Encounter (Signed)
Sent!

## 2012-07-20 ENCOUNTER — Telehealth: Payer: Self-pay

## 2012-07-20 ENCOUNTER — Other Ambulatory Visit: Payer: Self-pay | Admitting: Certified Registered Nurse Anesthetist

## 2012-07-20 MED ORDER — PREGABALIN 75 MG PO CAPS: 75.0000 mg | ORAL_CAPSULE | Freq: Two times a day (BID) | ORAL | Status: DC

## 2012-07-20 NOTE — Telephone Encounter (Signed)
Sue Lush advised.  Medication phoned to pharmacy.  Med list updated.

## 2012-07-20 NOTE — Telephone Encounter (Signed)
pts daughter Sue Lush left v/m; pt lt leg pain with shingles is no better; taking maximum amt of Gabapentin daily (12 pills total daily) pt still having continuous pain. Sue Lush request different med called to [facility].Please advise.

## 2012-07-20 NOTE — Telephone Encounter (Signed)
Taper off the gabapentin over the next week.  Take 9 a day for 2 days, then 6 a day 2 days, then 3 a day for 2 days. At that point, start lyrica with 1 pill a day for 3 days and then 1 pill bid.  Please call in 60 lyrica with 2 rf.  Sedation caution.  Please update the med list.  Thanks.

## 2012-07-22 ENCOUNTER — Other Ambulatory Visit (HOSPITAL_BASED_OUTPATIENT_CLINIC_OR_DEPARTMENT_OTHER): Payer: Medicare Other | Admitting: Lab

## 2012-07-22 ENCOUNTER — Ambulatory Visit (HOSPITAL_BASED_OUTPATIENT_CLINIC_OR_DEPARTMENT_OTHER): Payer: Medicare Other

## 2012-07-22 ENCOUNTER — Telehealth: Payer: Self-pay | Admitting: *Deleted

## 2012-07-22 ENCOUNTER — Other Ambulatory Visit: Payer: Self-pay | Admitting: Physician Assistant

## 2012-07-22 ENCOUNTER — Ambulatory Visit: Payer: Medicare Other | Admitting: Pharmacist

## 2012-07-22 ENCOUNTER — Telehealth: Payer: Self-pay | Admitting: Internal Medicine

## 2012-07-22 ENCOUNTER — Ambulatory Visit (HOSPITAL_BASED_OUTPATIENT_CLINIC_OR_DEPARTMENT_OTHER): Payer: Medicare Other | Admitting: Internal Medicine

## 2012-07-22 VITALS — BP 149/75 | HR 72 | Temp 97.5°F | Resp 18 | Ht 60.0 in | Wt 128.5 lb

## 2012-07-22 DIAGNOSIS — C9000 Multiple myeloma not having achieved remission: Secondary | ICD-10-CM

## 2012-07-22 DIAGNOSIS — I82409 Acute embolism and thrombosis of unspecified deep veins of unspecified lower extremity: Secondary | ICD-10-CM

## 2012-07-22 DIAGNOSIS — Z5112 Encounter for antineoplastic immunotherapy: Secondary | ICD-10-CM

## 2012-07-22 DIAGNOSIS — Z7901 Long term (current) use of anticoagulants: Secondary | ICD-10-CM

## 2012-07-22 LAB — CBC WITH DIFFERENTIAL/PLATELET
Basophils Absolute: 0 10*3/uL (ref 0.0–0.1)
Eosinophils Absolute: 0 10*3/uL (ref 0.0–0.5)
HCT: 36.8 % (ref 34.8–46.6)
HGB: 12.3 g/dL (ref 11.6–15.9)
NEUT#: 2.7 10*3/uL (ref 1.5–6.5)
RDW: 15.1 % — ABNORMAL HIGH (ref 11.2–14.5)
lymph#: 0.4 10*3/uL — ABNORMAL LOW (ref 0.9–3.3)

## 2012-07-22 LAB — COMPREHENSIVE METABOLIC PANEL (CC13)
ALT: 21 U/L (ref 0–55)
AST: 22 U/L (ref 5–34)
Albumin: 3.3 g/dL — ABNORMAL LOW (ref 3.5–5.0)
Calcium: 9.4 mg/dL (ref 8.4–10.4)
Chloride: 105 mEq/L (ref 98–107)
Potassium: 3.3 mEq/L — ABNORMAL LOW (ref 3.5–5.1)
Sodium: 142 mEq/L (ref 136–145)
Total Protein: 6 g/dL — ABNORMAL LOW (ref 6.4–8.3)

## 2012-07-22 LAB — PROTIME-INR: INR: 2.8 (ref 2.00–3.50)

## 2012-07-22 MED ORDER — ACYCLOVIR 400 MG PO TABS: 400.0000 mg | ORAL_TABLET | Freq: Two times a day (BID) | ORAL | Status: DC

## 2012-07-22 MED ORDER — ONDANSETRON HCL 8 MG PO TABS
8.0000 mg | ORAL_TABLET | Freq: Once | ORAL | Status: AC
  Administered 2012-07-22: 8 mg via ORAL

## 2012-07-22 MED ORDER — BORTEZOMIB CHEMO SQ INJECTION 3.5 MG (2.5MG/ML)
1.3000 mg/m2 | Freq: Once | INTRAMUSCULAR | Status: AC
  Administered 2012-07-22: 2 mg via SUBCUTANEOUS
  Filled 2012-07-22: qty 2

## 2012-07-22 MED ORDER — ZOLEDRONIC ACID 4 MG/5ML IV CONC
4.0000 mg | Freq: Once | INTRAVENOUS | Status: AC
  Administered 2012-07-22: 4 mg via INTRAVENOUS
  Filled 2012-07-22: qty 5

## 2012-07-22 MED ORDER — SODIUM CHLORIDE 0.9 % IV SOLN
Freq: Once | INTRAVENOUS | Status: AC
  Administered 2012-07-22: 14:00:00 via INTRAVENOUS

## 2012-07-22 NOTE — Telephone Encounter (Signed)
Per staff message and POF I have scheduled appts.  JMW  

## 2012-07-22 NOTE — Patient Instructions (Signed)
Continue Coumadin 5mg  (1 tablet) daily. Recheck PT/INR in 2 weeks with existing lab/infusion appt.  We will see you in the infusion area.

## 2012-07-22 NOTE — Progress Notes (Signed)
[facility] [phone]   [phone]  OFFICE PROGRESS NOTE  [name], MD [address] [place] [address]  DIAGNOSIS:  1. multiple myeloma diagnosed in September 2009,  2. history of bilateral lower extremity deep vein thrombosis diagnosed in November 2009  3. acute on chronic deep vein thrombosis in the left lower extremity diagnosed in October 2010.   PRIOR THERAPY:  1. status post palliative radiotherapy to the right femur and is she'll tuberosity, first was done in December 2011 and the second treatment was completed Jan 08 2011, and the third treatment to the right distal femur completed on 04/30/2011  2. status post palliative radiotherapy to the left proximal femur completed 02/28/2011.  3. Revlimid 25 mg by mouth daily for 21 days every 4 weeks in addition to oral Decadron at 40 mg by mouth on a weekly basis status post 37 cycles.   CURRENT THERAPY:  1. weekly subcutaneous Velcade 1.3 mg/M2 status post 47 cycles.  2. Coumadin 5 mg on Mondays and Thursdays and 2.5 mg on all other days.  3. Zometa 4 mg IV given every 2 months.   INTERVAL HISTORY: [name] 75 y.o. female returns to the clinic today for followup visit accompanied by her 2 daughters. The patient is feeling fine today with no specific complaints. She denied having any significant weight loss or night sweats. She has no bleeding issues. She is tolerating her treatment with subcutaneous Velcade fairly well with no significant adverse effects. The patient denied having any significant chest pain, shortness breath, cough or hemoptysis. She had repeat myeloma panel performed recently and she is here today for evaluation and discussion of her lab results.  MEDICAL HISTORY: Past Medical History  Diagnosis Date  . Blood transfusion   . Depression     Mild  . History of chicken pox   . Hyperlipidemia   . Hypertension   . Degenerative joint disease   . Diabetes  mellitus     Steroid related  . Phlebitis   . Multiple myeloma(203.0)     per Dr. [name], s/p palliative radiation for leg pain 2011 per Dr. Roselind Messier  . Deep vein thrombosis of bilateral lower extremities     ALLERGIES:   has no known allergies.  MEDICATIONS:  Current Outpatient Prescriptions  Medication Sig Dispense Refill  . ALPRAZolam (XANAX) 0.5 MG tablet Take 1 tablet (0.5 mg total) by mouth 3 (three) times daily as needed for sleep.  60 tablet  0  . Calcium Carbonate-Vitamin D 600-400 MG-UNIT per tablet Take 1 tablet by mouth 3 (three) times daily with meals.        Marland Kitchen dexamethasone (DECADRON) 4 MG tablet Take 10 tablets by mouth once a week  40 tablet  3  . glipiZIDE (GLUCOTROL XL) 2.5 MG 24 hr tablet Take 1 tablet (2.5 mg total) by mouth daily.  90 tablet  2  . glucose blood (ONE TOUCH ULTRA TEST) test strip Use as instructed  25 each  6  . ibuprofen (ADVIL,MOTRIN) 200 MG tablet Take 200 mg by mouth every 6 (six) hours as needed.        Marland Kitchen KLOR-CON M20 20 MEQ tablet TAKE ONE TABLET BY MOUTH EVERY DAY  30 each  1  . Lancets (ONETOUCH ULTRASOFT) lancets Test blood sugar once daily or as directed.  100 each  11  . lidocaine (LIDODERM) 5 % Place 1 patch onto the skin daily. Remove &  Discard patch within 12 hours or as directed by MD  30 patch  0  . lisinopril-hydrochlorothiazide (PRINZIDE,ZESTORETIC) 20-12.5 MG per tablet Take 0.5 tablets by mouth daily.       . magnesium oxide (MAG-OX) 400 MG tablet Take 400 mg by mouth daily.       Marland Kitchen oxyCODONE (OXY IR/ROXICODONE) 5 MG immediate release tablet Take 1 1/2 tabs by mouth every 6 hours as needed for pain  90 tablet  0  . oxyCODONE (OXYCONTIN) 10 MG 12 hr tablet Take 1 tablet (10 mg total) by mouth every 12 (twelve) hours.  60 tablet  0  . potassium chloride SA (K-DUR,KLOR-CON) 20 MEQ tablet Take 1 tablet (20 mEq total) by mouth daily.  7 tablet  0  . potassium chloride SA (K-DUR,KLOR-CON) 20 MEQ tablet Take 1 tablet (20 mEq total) by mouth  daily.  7 tablet  0  . pregabalin (LYRICA) 75 MG capsule Take 1 capsule (75 mg total) by mouth 2 (two) times daily.  60 capsule  2  . prochlorperazine (COMPAZINE) 10 MG tablet       . temazepam (RESTORIL) 30 MG capsule Take 1 capsule (30 mg total) by mouth at bedtime as needed for sleep.  30 capsule  1  . warfarin (COUMADIN) 5 MG tablet TAKE ONE TABLET BY MOUTH ONCE DAILY ON TUESDAY, THURSDAY AND SATURDAY, TAKE 1/2 TABLET ON ALL OTHER DAYS OR AS DIRECTED  60 tablet  1   No current facility-administered medications for this visit.   Facility-Administered Medications Ordered in Other Visits  Medication Dose Route Frequency Provider Last Rate Last Dose  . Zoledronic Acid (ZOMETA) 4 mg IVPB  4 mg Intravenous Once Si Gaul, MD        SURGICAL HISTORY:  Past Surgical History  Procedure Date  . Abdominal hysterectomy     REVIEW OF SYSTEMS:  A comprehensive review of systems was negative.   PHYSICAL EXAMINATION: General appearance: alert, cooperative and no distress Head: Normocephalic, without obvious abnormality, atraumatic Neck: no adenopathy Resp: clear to auscultation bilaterally Cardio: regular rate and rhythm, S1, S2 normal, no murmur, click, rub or gallop GI: soft, non-tender; bowel sounds normal; no masses,  no organomegaly Extremities: extremities normal, atraumatic, no cyanosis or edema Neurologic: Alert and oriented X 3, normal strength and tone. Normal symmetric reflexes. Normal coordination and gait  ECOG PERFORMANCE STATUS: 1 - Symptomatic but completely ambulatory  Blood pressure 149/75, pulse 72, temperature 97.5 F (36.4 C), temperature source Oral, resp. rate 18, height 5' (1.524 m), weight 128 lb 8 oz (58.287 kg).  LABORATORY DATA: Lab Results  Component Value Date   WBC 3.6* 07/22/2012   HGB 12.3 07/22/2012   HCT 36.8 07/22/2012   MCV 92.8 07/22/2012   PLT 191 07/22/2012      Chemistry      Component Value Date/Time   NA 144 07/15/2012 1333   NA  141 06/24/2012 1620   K 3.4* 07/15/2012 1333   K 3.1* 06/24/2012 1620   CL 107 07/15/2012 1333   CL 101 06/24/2012 1620   CO2 28 07/15/2012 1333   CO2 32 06/24/2012 1620   BUN 19.0 07/15/2012 1333   BUN 15 06/24/2012 1620   CREATININE 1.0 07/15/2012 1333   CREATININE 0.96 06/24/2012 1620      Component Value Date/Time   CALCIUM 9.1 07/15/2012 1333   CALCIUM 9.4 06/24/2012 1620   CALCIUM  Value: 5.7 CRITICAL RESULT CALLED TO, READ BACK BY AND VERIFIED WITH: JAMIE TRACY,RN  [id number] @ 1433 BY J SCOTTON (NOTE)  Amended report. Result repeated and verified. CORRECTED ON 10/14 AT 1429: PREVIOUSLY REPORTED AS Result repeated and verified.* 06/13/2008 1605   ALKPHOS 50 07/15/2012 1333   ALKPHOS 50 06/24/2012 1620   AST 20 07/15/2012 1333   AST 19 06/24/2012 1620   ALT 16 07/15/2012 1333   ALT 15 06/24/2012 1620   BILITOT 0.26 07/15/2012 1333   BILITOT 0.2* 06/24/2012 1620       RADIOGRAPHIC STUDIES: Dg Chest 2 View  07/01/2012  *RADIOLOGY REPORT*  Clinical Data: Pneumonia follow up.  History of multiple myeloma. Ex-smoker.  History of hypertension and diabetes.  CHEST - 2 VIEW  Comparison: 05/13/2012. Bone survey 03/12/2011.  Findings: There is borderline cardiac silhouette enlargement. Ectasia and tortuosity of thoracic aorta is present.  The infiltrative density has diminished in the right medial base since previous study.  On the lateral image the infiltrative density in the retrocardiac region has diminished.  There is slight residual haziness without consolidation. There is minimal central peribronchial thickening.  No pleural effusion is evident.  There is slight scoliosis.  There is osteopenic appearance of the bones with changes of degenerative spondylosis.  Compression fractures of the T5 and T7 vertebral bodies are seen without significant change. Compression of the superior aspect of the L3 vertebral body is also evident.  It is unchanged.  IMPRESSION: Borderline cardiac silhouette  enlargement.  Decrease in the right medial basilar and retrocardiac infiltrative densities since prior study.  Slight residual haziness is seen with no evidence consolidation. Minimal central peribronchial thickening.  Chronic bony abnormalities are described and detailed above without significant change from most recent prior examinations.   Original Report Authenticated By: Onalee Hua Call     ASSESSMENT: This is a very pleasant 76 years old African American female with history of multiple myeloma currently on treatment with subcutaneous Velcade and tolerating it fairly well. The patient has no evidence for disease progression on his recent blood work.   PLAN: I discussed the lab result with the patient and her family. I recommended for her to continue on subcutaneous Velcade with the current dose. She would come back for followup visit in 2 weeks for evaluation and management any adverse effect of her treatment. She was advised to call me immediately she has any concerning symptoms in the interval. For the bone disease, the patient will continue on treatment with Zometa every 2 months. For deep venous thrombosis she is currently on Coumadin and followed by the Coumadin clinic at the cancer Center.  All questions were answered. The patient knows to call the clinic with any problems, questions or concerns. We can certainly see the patient much sooner if necessary.  I spent 15 minutes counseling the patient face to face. The total time spent in the appointment was 25 minutes.

## 2012-07-22 NOTE — Telephone Encounter (Signed)
gv and printed appt schedule to pt for NOV and DEC...emailed michelle to add chemo..the patient aware.

## 2012-07-22 NOTE — Progress Notes (Signed)
Pt to complete Neurontin taper on 11/26. Lyrica to start 07/28/12. Acyclovir to start 07/23/12 (should not effect INR). Will evaluate above medication changes on INR in 2 weeks.  Pt will have been on Acyclovir for ~ 10days and Lyrica for ~ 1 week by 08/05/12 appointment. Continue Coumadin 5mg  (1 tablet) daily. Recheck PT/INR in 2 weeks with existing lab/infusion appt on 08/05/12.

## 2012-07-22 NOTE — Patient Instructions (Addendum)
 Cancer Center Discharge Instructions for Patients Receiving Chemotherapy  Today you received the following chemotherapy agents zometa/velcade  To help prevent nausea and vomiting after your treatment, we encourage you to take your nausea medication  and take it as often as prescribed  If you develop nausea and vomiting that is not controlled by your nausea medication, call the clinic. If it is after clinic hours your family physician or the after hours number for the clinic or go to the Emergency Department.   BELOW ARE SYMPTOMS THAT SHOULD BE REPORTED IMMEDIATELY:  *FEVER GREATER THAN 100.5 F  *CHILLS WITH OR WITHOUT FEVER  NAUSEA AND VOMITING THAT IS NOT CONTROLLED WITH YOUR NAUSEA MEDICATION  *UNUSUAL SHORTNESS OF BREATH  *UNUSUAL BRUISING OR BLEEDING  TENDERNESS IN MOUTH AND THROAT WITH OR WITHOUT PRESENCE OF ULCERS  *URINARY PROBLEMS  *BOWEL PROBLEMS  UNUSUAL RASH Items with * indicate a potential emergency and should be followed up as soon as possible.  One of the nurses will contact you 24 hours after your treatment. Please let the nurse know about any problems that you may have experienced. Feel free to call the clinic you have any questions or concerns. The clinic phone number is [phone].   I have been informed and understand all the instructions given to me. I know to contact the clinic, my physician, or go to the Emergency Department if any problems should occur. I do not have any questions at this time, but understand that I may call the clinic during office hours   should I have any questions or need assistance in obtaining follow up care.    __________________________________________  _____________  __________ Signature of Patient or Authorized Representative            Date                   Time    __________________________________________ Nurse's Signature

## 2012-07-25 NOTE — Patient Instructions (Signed)
Your myeloma panel showed no evidence for disease progression. Continue current treatment with subcutaneous Velcade. Followup in 2 weeks

## 2012-07-28 ENCOUNTER — Other Ambulatory Visit: Payer: Self-pay | Admitting: Physician Assistant

## 2012-07-30 ENCOUNTER — Ambulatory Visit (HOSPITAL_BASED_OUTPATIENT_CLINIC_OR_DEPARTMENT_OTHER): Payer: Medicare Other

## 2012-07-30 ENCOUNTER — Other Ambulatory Visit (HOSPITAL_BASED_OUTPATIENT_CLINIC_OR_DEPARTMENT_OTHER): Payer: Medicare Other | Admitting: Lab

## 2012-07-30 VITALS — BP 171/88 | HR 91 | Temp 98.3°F | Resp 20

## 2012-07-30 DIAGNOSIS — Z5112 Encounter for antineoplastic immunotherapy: Secondary | ICD-10-CM

## 2012-07-30 DIAGNOSIS — C9000 Multiple myeloma not having achieved remission: Secondary | ICD-10-CM

## 2012-07-30 LAB — COMPREHENSIVE METABOLIC PANEL (CC13)
AST: 21 U/L (ref 5–34)
Albumin: 3.5 g/dL (ref 3.5–5.0)
Alkaline Phosphatase: 54 U/L (ref 40–150)
Potassium: 3.8 mEq/L (ref 3.5–5.1)
Sodium: 142 mEq/L (ref 136–145)
Total Bilirubin: 0.25 mg/dL (ref 0.20–1.20)
Total Protein: 6.5 g/dL (ref 6.4–8.3)

## 2012-07-30 LAB — CBC WITH DIFFERENTIAL/PLATELET
EOS%: 0.2 % (ref 0.0–7.0)
MCH: 30 pg (ref 25.1–34.0)
MCHC: 32.1 g/dL (ref 31.5–36.0)
MCV: 93.4 fL (ref 79.5–101.0)
MONO%: 0.6 % (ref 0.0–14.0)
RBC: 4.07 10*6/uL (ref 3.70–5.45)
RDW: 14 % (ref 11.2–14.5)

## 2012-07-30 MED ORDER — ONDANSETRON HCL 8 MG PO TABS
8.0000 mg | ORAL_TABLET | Freq: Once | ORAL | Status: AC
  Administered 2012-07-30: 8 mg via ORAL

## 2012-07-30 MED ORDER — BORTEZOMIB CHEMO SQ INJECTION 3.5 MG (2.5MG/ML)
1.3000 mg/m2 | Freq: Once | INTRAMUSCULAR | Status: AC
  Administered 2012-07-30: 2 mg via SUBCUTANEOUS
  Filled 2012-07-30: qty 2

## 2012-07-30 NOTE — Patient Instructions (Signed)
[facility] Discharge Instructions for Patients Receiving Chemotherapy  Today you received the following chemotherapy agents velcade  To help prevent nausea and vomiting after your treatment, we encourage you to take your nausea medication and take it as often as prescribed   If you develop nausea and vomiting that is not controlled by your nausea medication, call the clinic. If it is after clinic hours your family physician or the after hours number for the clinic or go to the Emergency Department.   BELOW ARE SYMPTOMS THAT SHOULD BE REPORTED IMMEDIATELY:  *FEVER GREATER THAN 100.5 F  *CHILLS WITH OR WITHOUT FEVER  NAUSEA AND VOMITING THAT IS NOT CONTROLLED WITH YOUR NAUSEA MEDICATION  *UNUSUAL SHORTNESS OF BREATH  *UNUSUAL BRUISING OR BLEEDING  TENDERNESS IN MOUTH AND THROAT WITH OR WITHOUT PRESENCE OF ULCERS  *URINARY PROBLEMS  *BOWEL PROBLEMS  UNUSUAL RASH Items with * indicate a potential emergency and should be followed up as soon as possible.  One of the nurses will contact you 24 hours after your treatment. Please let the nurse know about any problems that you may have experienced. Feel free to call the clinic you have any questions or concerns. The clinic phone number is [phone].   I have been informed and understand all the instructions given to me. I know to contact the clinic, my physician, or go to the Emergency Department if any problems should occur. I do not have any questions at this time, but understand that I may call the clinic during office hours   should I have any questions or need assistance in obtaining follow up care.    __________________________________________  _____________  __________ Signature of Patient or Authorized Representative            Date                   Time    __________________________________________ Nurse's Signature    

## 2012-08-04 ENCOUNTER — Other Ambulatory Visit: Payer: Self-pay | Admitting: Physician Assistant

## 2012-08-04 DIAGNOSIS — C9000 Multiple myeloma not having achieved remission: Secondary | ICD-10-CM

## 2012-08-05 ENCOUNTER — Ambulatory Visit (HOSPITAL_BASED_OUTPATIENT_CLINIC_OR_DEPARTMENT_OTHER): Payer: Medicare Other

## 2012-08-05 ENCOUNTER — Other Ambulatory Visit: Payer: Medicare Other

## 2012-08-05 ENCOUNTER — Ambulatory Visit (HOSPITAL_BASED_OUTPATIENT_CLINIC_OR_DEPARTMENT_OTHER): Payer: Medicare Other | Admitting: Physician Assistant

## 2012-08-05 ENCOUNTER — Encounter: Payer: Self-pay | Admitting: Physician Assistant

## 2012-08-05 ENCOUNTER — Telehealth: Payer: Self-pay | Admitting: Internal Medicine

## 2012-08-05 ENCOUNTER — Ambulatory Visit: Payer: Self-pay | Admitting: Pharmacist

## 2012-08-05 ENCOUNTER — Other Ambulatory Visit (HOSPITAL_BASED_OUTPATIENT_CLINIC_OR_DEPARTMENT_OTHER): Payer: Medicare Other | Admitting: Lab

## 2012-08-05 VITALS — BP 117/70 | HR 69 | Temp 97.6°F | Resp 18 | Ht 60.0 in | Wt 128.5 lb

## 2012-08-05 DIAGNOSIS — I82409 Acute embolism and thrombosis of unspecified deep veins of unspecified lower extremity: Secondary | ICD-10-CM

## 2012-08-05 DIAGNOSIS — C9000 Multiple myeloma not having achieved remission: Secondary | ICD-10-CM

## 2012-08-05 DIAGNOSIS — I82509 Chronic embolism and thrombosis of unspecified deep veins of unspecified lower extremity: Secondary | ICD-10-CM

## 2012-08-05 DIAGNOSIS — Z86718 Personal history of other venous thrombosis and embolism: Secondary | ICD-10-CM

## 2012-08-05 DIAGNOSIS — Z7901 Long term (current) use of anticoagulants: Secondary | ICD-10-CM

## 2012-08-05 DIAGNOSIS — Z5112 Encounter for antineoplastic immunotherapy: Secondary | ICD-10-CM

## 2012-08-05 LAB — CBC WITH DIFFERENTIAL/PLATELET
Basophils Absolute: 0 10*3/uL (ref 0.0–0.1)
EOS%: 1.8 % (ref 0.0–7.0)
HGB: 12.9 g/dL (ref 11.6–15.9)
LYMPH%: 12.4 % — ABNORMAL LOW (ref 14.0–49.7)
MCH: 30.8 pg (ref 25.1–34.0)
MCV: 93.7 fL (ref 79.5–101.0)
MONO%: 14.1 % — ABNORMAL HIGH (ref 0.0–14.0)
Platelets: 185 10*3/uL (ref 145–400)
RBC: 4.17 10*6/uL (ref 3.70–5.45)
RDW: 14.8 % — ABNORMAL HIGH (ref 11.2–14.5)

## 2012-08-05 LAB — COMPREHENSIVE METABOLIC PANEL (CC13)
ALT: 18 U/L (ref 0–55)
Alkaline Phosphatase: 57 U/L (ref 40–150)
Sodium: 141 mEq/L (ref 136–145)
Total Bilirubin: 0.3 mg/dL (ref 0.20–1.20)
Total Protein: 6.4 g/dL (ref 6.4–8.3)

## 2012-08-05 LAB — PROTIME-INR: INR: 2.1 (ref 2.00–3.50)

## 2012-08-05 MED ORDER — BORTEZOMIB CHEMO SQ INJECTION 3.5 MG (2.5MG/ML)
1.3000 mg/m2 | Freq: Once | INTRAMUSCULAR | Status: AC
  Administered 2012-08-05: 2 mg via SUBCUTANEOUS
  Filled 2012-08-05: qty 2

## 2012-08-05 MED ORDER — OXYCODONE HCL 5 MG PO TABS: ORAL_TABLET | ORAL | Status: DC

## 2012-08-05 MED ORDER — PROCHLORPERAZINE MALEATE 10 MG PO TABS: 10.0000 mg | ORAL_TABLET | Freq: Four times a day (QID) | ORAL | Status: DC | PRN

## 2012-08-05 MED ORDER — ONDANSETRON HCL 8 MG PO TABS
8.0000 mg | ORAL_TABLET | Freq: Once | ORAL | Status: AC
  Administered 2012-08-05: 8 mg via ORAL

## 2012-08-05 NOTE — Progress Notes (Signed)
INR at goal. Pt seen in infusion area, no missed doses or bleeding noted. No changes. Continue 5 mg daily will see patient at next infusion on 12/26 in the infusion area

## 2012-08-05 NOTE — Patient Instructions (Addendum)
[facility] Discharge Instructions for Patients Receiving Chemotherapy  Today you received the following chemotherapy agents Velcade.  To help prevent nausea and vomiting after your treatment, we encourage you to take your nausea medication as prescribed.   If you develop nausea and vomiting that is not controlled by your nausea medication, call the clinic. If it is after clinic hours your family physician or the after hours number for the clinic or go to the Emergency Department.   BELOW ARE SYMPTOMS THAT SHOULD BE REPORTED IMMEDIATELY:  *FEVER GREATER THAN 100.5 F  *CHILLS WITH OR WITHOUT FEVER  NAUSEA AND VOMITING THAT IS NOT CONTROLLED WITH YOUR NAUSEA MEDICATION  *UNUSUAL SHORTNESS OF BREATH  *UNUSUAL BRUISING OR BLEEDING  TENDERNESS IN MOUTH AND THROAT WITH OR WITHOUT PRESENCE OF ULCERS  *URINARY PROBLEMS  *BOWEL PROBLEMS  UNUSUAL RASH Items with * indicate a potential emergency and should be followed up as soon as possible.  Feel free to call the clinic you have any questions or concerns. The clinic phone number is [phone].   I have been informed and understand all the instructions given to me. I know to contact the clinic, my physician, or go to the Emergency Department if any problems should occur. I do not have any questions at this time, but understand that I may call the clinic during office hours   should I have any questions or need assistance in obtaining follow up care.    __________________________________________  _____________  __________ Signature of Patient or Authorized Representative            Date                   Time    __________________________________________ Nurse's Signature    

## 2012-08-05 NOTE — Patient Instructions (Addendum)
Continue weekly chemotherapy Follow up in 2 weeks

## 2012-08-05 NOTE — Patient Instructions (Addendum)
INR is at goal, [place] Job! No changes Continue 5 mg daily Will recheck in 3 weeks with your scheduled lab and infusion time

## 2012-08-05 NOTE — Telephone Encounter (Signed)
appts made and printed for pt aom °

## 2012-08-11 NOTE — Progress Notes (Signed)
[facility] [phone]   [phone]  OFFICE PROGRESS NOTE  [name], MD [address] [place] [address]  DIAGNOSIS:  1. multiple myeloma diagnosed in September 2009,  2. history of bilateral lower extremity deep vein thrombosis diagnosed in November 2009  3. acute on chronic deep vein thrombosis in the left lower extremity diagnosed in October 2010.   PRIOR THERAPY:  1. status post palliative radiotherapy to the right femur and is she'll tuberosity, first was done in December 2011 and the second treatment was completed Jan 08 2011, and the third treatment to the right distal femur completed on 04/30/2011  2. status post palliative radiotherapy to the left proximal femur completed 02/28/2011.  3. Revlimid 25 mg by mouth daily for 21 days every 4 weeks in addition to oral Decadron at 40 mg by mouth on a weekly basis status post 37 cycles.   CURRENT THERAPY:  1. weekly subcutaneous Velcade 1.3 mg/M2 status post 49 cycles.  2. Coumadin 5 mg on Mondays and Thursdays and 2.5 mg on all other days.  3. Zometa 4 mg IV given every 2 months.   INTERVAL HISTORY: [name] 76 y.o. female returns to the clinic today for followup visit accompanied by her daughter. The patient is feeling fine today with no specific complaints. She denied having any significant weight loss or night sweats. She has no bleeding issues. She is tolerating her treatment with subcutaneous Velcade fairly well with no significant adverse effects. The patient denied having any significant chest pain, shortness breath, cough or hemoptysis. She requests refills for her Oxy IR and her Compazine.   MEDICAL HISTORY: Past Medical History  Diagnosis Date  . Blood transfusion   . Depression     Mild  . History of chicken pox   . Hyperlipidemia   . Hypertension   . Degenerative joint disease   . Diabetes mellitus     Steroid related  . Phlebitis   . Multiple  myeloma(203.0)     per Dr. [name], s/p palliative radiation for leg pain 2011 per Dr. Roselind Messier  . Deep vein thrombosis of bilateral lower extremities     ALLERGIES:   has no known allergies.  MEDICATIONS:  Current Outpatient Prescriptions  Medication Sig Dispense Refill  . acyclovir (ZOVIRAX) 400 MG tablet Take 1 tablet (400 mg total) by mouth 2 (two) times daily.  60 tablet  2  . ALPRAZolam (XANAX) 0.5 MG tablet Take 1 tablet (0.5 mg total) by mouth 3 (three) times daily as needed for sleep or anxiety.  60 tablet  1  . Calcium Carbonate-Vitamin D 600-400 MG-UNIT per tablet Take 1 tablet by mouth 3 (three) times daily with meals.        Marland Kitchen dexamethasone (DECADRON) 4 MG tablet Take 10 tablets by mouth once a week  40 tablet  3  . gabapentin (NEURONTIN) 100 MG capsule       . glipiZIDE (GLUCOTROL XL) 2.5 MG 24 hr tablet Take 1 tablet (2.5 mg total) by mouth daily.  90 tablet  2  . glucose blood (ONE TOUCH ULTRA TEST) test strip Use as instructed  25 each  6  . ibuprofen (ADVIL,MOTRIN) 200 MG tablet Take 200 mg by mouth every 6 (six) hours as needed.        Marland Kitchen KLOR-CON M20 20 MEQ tablet TAKE ONE TABLET BY MOUTH EVERY DAY  30 each  1  . Lancets (ONETOUCH ULTRASOFT) lancets  Test blood sugar once daily or as directed.  100 each  11  . lidocaine (LIDODERM) 5 % Place 1 patch onto the skin daily. Remove & Discard patch within 12 hours or as directed by MD  30 patch  0  . lisinopril-hydrochlorothiazide (PRINZIDE,ZESTORETIC) 20-12.5 MG per tablet Take 0.5 tablets by mouth daily.       . magnesium oxide (MAG-OX) 400 MG tablet Take 400 mg by mouth daily.       Marland Kitchen oxyCODONE (OXY IR/ROXICODONE) 5 MG immediate release tablet Take 1 1/2 tabs by mouth every 6 hours as needed for pain  90 tablet  0  . oxyCODONE (OXYCONTIN) 10 MG 12 hr tablet Take 1 tablet (10 mg total) by mouth every 12 (twelve) hours.  60 tablet  0  . potassium chloride SA (K-DUR,KLOR-CON) 20 MEQ tablet Take 1 tablet (20 mEq total) by mouth  daily.  7 tablet  0  . potassium chloride SA (K-DUR,KLOR-CON) 20 MEQ tablet Take 1 tablet (20 mEq total) by mouth daily.  7 tablet  0  . pregabalin (LYRICA) 75 MG capsule Take 1 capsule (75 mg total) by mouth 2 (two) times daily.  60 capsule  2  . prochlorperazine (COMPAZINE) 10 MG tablet       . prochlorperazine (COMPAZINE) 10 MG tablet Take 1 tablet (10 mg total) by mouth every 6 (six) hours as needed.  30 tablet  1  . temazepam (RESTORIL) 30 MG capsule Take 1 capsule (30 mg total) by mouth at bedtime as needed for sleep.  30 capsule  1  . warfarin (COUMADIN) 5 MG tablet TAKE ONE TABLET BY MOUTH ONCE DAILY ON TUESDAY, THURSDAY AND SATURDAY, TAKE 1/2 TABLET ON ALL OTHER DAYS OR AS DIRECTED  60 tablet  1   No current facility-administered medications for this visit.   Facility-Administered Medications Ordered in Other Visits  Medication Dose Route Frequency Provider Last Rate Last Dose  . Zoledronic Acid (ZOMETA) 4 mg IVPB  4 mg Intravenous Once Si Gaul, MD        SURGICAL HISTORY:  Past Surgical History  Procedure Date  . Abdominal hysterectomy     REVIEW OF SYSTEMS:  A comprehensive review of systems was negative.   PHYSICAL EXAMINATION: General appearance: alert, cooperative and no distress Head: Normocephalic, without obvious abnormality, atraumatic Neck: no adenopathy Resp: clear to auscultation bilaterally Cardio: regular rate and rhythm, S1, S2 normal, no murmur, click, rub or gallop GI: soft, non-tender; bowel sounds normal; no masses,  no organomegaly Extremities: extremities normal, atraumatic, no cyanosis or edema Neurologic: Alert and oriented X 3, normal strength and tone. Normal symmetric reflexes. Normal coordination and gait  ECOG PERFORMANCE STATUS: 1 - Symptomatic but completely ambulatory  Blood pressure 117/70, pulse 69, temperature 97.6 F (36.4 C), temperature source Oral, resp. rate 18, height 5' (1.524 m), weight 128 lb 8 oz (58.287  kg).  LABORATORY DATA: Lab Results  Component Value Date   WBC 3.9 08/05/2012   HGB 12.9 08/05/2012   HCT 39.1 08/05/2012   MCV 93.7 08/05/2012   PLT 185 08/05/2012      Chemistry      Component Value Date/Time   NA 141 08/05/2012 1356   NA 141 06/24/2012 1620   K 3.9 08/05/2012 1356   K 3.1* 06/24/2012 1620   CL 103 08/05/2012 1356   CL 101 06/24/2012 1620   CO2 31* 08/05/2012 1356   CO2 32 06/24/2012 1620   BUN 17.0 08/05/2012 1356  BUN 15 06/24/2012 1620   CREATININE 1.0 08/05/2012 1356   CREATININE 0.96 06/24/2012 1620      Component Value Date/Time   CALCIUM 9.7 08/05/2012 1356   CALCIUM 9.4 06/24/2012 1620   CALCIUM  Value: 5.7 CRITICAL RESULT CALLED TO, READ BACK BY AND VERIFIED WITH: JAMIE TRACY,RN [id number] @ 1433 BY J SCOTTON (NOTE)  Amended report. Result repeated and verified. CORRECTED ON 10/14 AT 1429: PREVIOUSLY REPORTED AS Result repeated and verified.* 06/13/2008 1605   ALKPHOS 57 08/05/2012 1356   ALKPHOS 50 06/24/2012 1620   AST 19 08/05/2012 1356   AST 19 06/24/2012 1620   ALT 18 08/05/2012 1356   ALT 15 06/24/2012 1620   BILITOT 0.30 08/05/2012 1356   BILITOT 0.2* 06/24/2012 1620       RADIOGRAPHIC STUDIES: Dg Chest 2 View  07/01/2012  *RADIOLOGY REPORT*  Clinical Data: Pneumonia follow up.  History of multiple myeloma. Ex-smoker.  History of hypertension and diabetes.  CHEST - 2 VIEW  Comparison: 05/13/2012. Bone survey 03/12/2011.  Findings: There is borderline cardiac silhouette enlargement. Ectasia and tortuosity of thoracic aorta is present.  The infiltrative density has diminished in the right medial base since previous study.  On the lateral image the infiltrative density in the retrocardiac region has diminished.  There is slight residual haziness without consolidation. There is minimal central peribronchial thickening.  No pleural effusion is evident.  There is slight scoliosis.  There is osteopenic appearance of the bones with changes of degenerative  spondylosis.  Compression fractures of the T5 and T7 vertebral bodies are seen without significant change. Compression of the superior aspect of the L3 vertebral body is also evident.  It is unchanged.  IMPRESSION: Borderline cardiac silhouette enlargement.  Decrease in the right medial basilar and retrocardiac infiltrative densities since prior study.  Slight residual haziness is seen with no evidence consolidation. Minimal central peribronchial thickening.  Chronic bony abnormalities are described and detailed above without significant change from most recent prior examinations.   Original Report Authenticated By: Onalee Hua Call     ASSESSMENT/PLAN: This is a very pleasant 76 years old African American female with history of multiple myeloma currently on treatment with subcutaneous Velcade and tolerating it fairly well. The patient has no evidence for disease progression on his recent blood work. The patient was discussed with Dr. [name]. She will continue with her weekly subcutaneous Velcade. She'll followup in 2 weeks with repeat CBC differential, C. met and LDH. She will continue followup with the [facility] and take her Coumadin per their instructions. She was given a prescription for her OxyIR 5 mg tablets one by mouth every 6 hours as needed for pain a total of 90 tablets with no refill. A prescription for her Compazine was sent to her pharmacy of record via [facility].  Laural Benes, Lillyona Polasek E, PA-C   All questions were answered. The patient knows to call the clinic with any problems, questions or concerns. We can certainly see the patient much sooner if necessary.  I spent 20 minutes counseling the patient face to face. The total time spent in the appointment was 30 minutes.

## 2012-08-12 ENCOUNTER — Other Ambulatory Visit (HOSPITAL_BASED_OUTPATIENT_CLINIC_OR_DEPARTMENT_OTHER): Payer: Medicare Other | Admitting: Lab

## 2012-08-12 ENCOUNTER — Ambulatory Visit (HOSPITAL_BASED_OUTPATIENT_CLINIC_OR_DEPARTMENT_OTHER): Payer: Medicare Other

## 2012-08-12 VITALS — BP 119/70 | HR 74 | Temp 98.9°F

## 2012-08-12 DIAGNOSIS — C9000 Multiple myeloma not having achieved remission: Secondary | ICD-10-CM

## 2012-08-12 DIAGNOSIS — Z5112 Encounter for antineoplastic immunotherapy: Secondary | ICD-10-CM

## 2012-08-12 LAB — CBC WITH DIFFERENTIAL/PLATELET
BASO%: 0.3 % (ref 0.0–2.0)
EOS%: 1.8 % (ref 0.0–7.0)
MCH: 31 pg (ref 25.1–34.0)
MCHC: 33.5 g/dL (ref 31.5–36.0)
MONO%: 13.4 % (ref 0.0–14.0)
RBC: 4.22 10*6/uL (ref 3.70–5.45)
RDW: 15 % — ABNORMAL HIGH (ref 11.2–14.5)
lymph#: 0.4 10*3/uL — ABNORMAL LOW (ref 0.9–3.3)

## 2012-08-12 LAB — COMPREHENSIVE METABOLIC PANEL (CC13)
ALT: 20 U/L (ref 0–55)
AST: 21 U/L (ref 5–34)
Albumin: 3.5 g/dL (ref 3.5–5.0)
Alkaline Phosphatase: 57 U/L (ref 40–150)
Calcium: 9 mg/dL (ref 8.4–10.4)
Chloride: 102 mEq/L (ref 98–107)
Potassium: 3.7 mEq/L (ref 3.5–5.1)

## 2012-08-12 MED ORDER — BORTEZOMIB CHEMO SQ INJECTION 3.5 MG (2.5MG/ML)
1.3000 mg/m2 | Freq: Once | INTRAMUSCULAR | Status: AC
  Administered 2012-08-12: 2 mg via SUBCUTANEOUS
  Filled 2012-08-12: qty 2

## 2012-08-12 MED ORDER — ONDANSETRON HCL 8 MG PO TABS
8.0000 mg | ORAL_TABLET | Freq: Once | ORAL | Status: AC
  Administered 2012-08-12: 8 mg via ORAL

## 2012-08-12 NOTE — Patient Instructions (Signed)
[facility] Discharge Instructions for Patients Receiving Chemotherapy  Today you received the following chemotherapy agents valcade  To help prevent nausea and vomiting after your treatment, we encourage you to take your nausea medication    If you develop nausea and vomiting that is not controlled by your nausea medication, call the clinic. If it is after clinic hours your family physician or the after hours number for the clinic or go to the Emergency Department.   BELOW ARE SYMPTOMS THAT SHOULD BE REPORTED IMMEDIATELY:  *FEVER GREATER THAN 100.5 F  *CHILLS WITH OR WITHOUT FEVER  NAUSEA AND VOMITING THAT IS NOT CONTROLLED WITH YOUR NAUSEA MEDICATION  *UNUSUAL SHORTNESS OF BREATH  *UNUSUAL BRUISING OR BLEEDING  TENDERNESS IN MOUTH AND THROAT WITH OR WITHOUT PRESENCE OF ULCERS  *URINARY PROBLEMS  *BOWEL PROBLEMS  UNUSUAL RASH Items with * indicate a potential emergency and should be followed up as soon as possible.  One of the nurses will contact you 24 hours after your treatment. Please let the nurse know about any problems that you may have experienced. Feel free to call the clinic you have any questions or concerns. The clinic phone number is [phone].   I have been informed and understand all the instructions given to me. I know to contact the clinic, my physician, or go to the Emergency Department if any problems should occur. I do not have any questions at this time, but understand that I may call the clinic during office hours   should I have any questions or need assistance in obtaining follow up care.    __________________________________________  _____________  __________ Signature of Patient or Authorized Representative            Date                   Time    __________________________________________ Nurse's Signature

## 2012-08-16 ENCOUNTER — Other Ambulatory Visit: Payer: Self-pay | Admitting: *Deleted

## 2012-08-16 DIAGNOSIS — C9000 Multiple myeloma not having achieved remission: Secondary | ICD-10-CM

## 2012-08-16 MED ORDER — OXYCODONE HCL 10 MG PO TB12: 10.0000 mg | ORAL_TABLET | Freq: Two times a day (BID) | ORAL | Status: DC

## 2012-08-17 ENCOUNTER — Ambulatory Visit: Payer: Medicare Other | Admitting: Family Medicine

## 2012-08-19 ENCOUNTER — Ambulatory Visit (HOSPITAL_BASED_OUTPATIENT_CLINIC_OR_DEPARTMENT_OTHER): Payer: Medicare Other | Admitting: Physician Assistant

## 2012-08-19 ENCOUNTER — Other Ambulatory Visit (HOSPITAL_BASED_OUTPATIENT_CLINIC_OR_DEPARTMENT_OTHER): Payer: Medicare Other | Admitting: Lab

## 2012-08-19 ENCOUNTER — Ambulatory Visit (HOSPITAL_BASED_OUTPATIENT_CLINIC_OR_DEPARTMENT_OTHER): Payer: Medicare Other

## 2012-08-19 ENCOUNTER — Telehealth: Payer: Self-pay | Admitting: Internal Medicine

## 2012-08-19 ENCOUNTER — Other Ambulatory Visit: Payer: Medicare Other | Admitting: Lab

## 2012-08-19 ENCOUNTER — Telehealth: Payer: Self-pay | Admitting: *Deleted

## 2012-08-19 ENCOUNTER — Ambulatory Visit: Payer: Self-pay | Admitting: Pharmacist

## 2012-08-19 VITALS — BP 116/68 | HR 62 | Temp 97.7°F | Resp 20 | Ht 60.0 in | Wt 129.5 lb

## 2012-08-19 DIAGNOSIS — C9000 Multiple myeloma not having achieved remission: Secondary | ICD-10-CM

## 2012-08-19 DIAGNOSIS — Z5112 Encounter for antineoplastic immunotherapy: Secondary | ICD-10-CM

## 2012-08-19 DIAGNOSIS — I82409 Acute embolism and thrombosis of unspecified deep veins of unspecified lower extremity: Secondary | ICD-10-CM

## 2012-08-19 LAB — CBC WITH DIFFERENTIAL/PLATELET
BASO%: 0.2 % (ref 0.0–2.0)
Basophils Absolute: 0 10*3/uL (ref 0.0–0.1)
EOS%: 1.7 % (ref 0.0–7.0)
HCT: 39.9 % (ref 34.8–46.6)
MCH: 29.7 pg (ref 25.1–34.0)
MCHC: 31.8 g/dL (ref 31.5–36.0)
MCV: 93.4 fL (ref 79.5–101.0)
MONO%: 9 % (ref 0.0–14.0)
NEUT%: 76.8 % (ref 38.4–76.8)
RDW: 14.3 % (ref 11.2–14.5)
lymph#: 0.5 10*3/uL — ABNORMAL LOW (ref 0.9–3.3)

## 2012-08-19 LAB — COMPREHENSIVE METABOLIC PANEL (CC13)
ALT: 18 U/L (ref 0–55)
AST: 20 U/L (ref 5–34)
Alkaline Phosphatase: 58 U/L (ref 40–150)
Chloride: 103 mEq/L (ref 98–107)
Creatinine: 1 mg/dL (ref 0.6–1.1)
Total Bilirubin: 0.27 mg/dL (ref 0.20–1.20)

## 2012-08-19 LAB — PROTIME-INR: INR: 4.3 — ABNORMAL HIGH (ref 2.00–3.50)

## 2012-08-19 MED ORDER — ONDANSETRON HCL 8 MG PO TABS
8.0000 mg | ORAL_TABLET | Freq: Once | ORAL | Status: AC
  Administered 2012-08-19: 8 mg via ORAL

## 2012-08-19 MED ORDER — BORTEZOMIB CHEMO SQ INJECTION 3.5 MG (2.5MG/ML)
1.3000 mg/m2 | Freq: Once | INTRAMUSCULAR | Status: AC
  Administered 2012-08-19: 2 mg via SUBCUTANEOUS
  Filled 2012-08-19: qty 2

## 2012-08-19 NOTE — Telephone Encounter (Signed)
Gave pt appt for December 2013 and January 2014 lab and Ml emailed [place] regarding chemo

## 2012-08-19 NOTE — Telephone Encounter (Signed)
Talked to pt, regarding December 2013  and January 2014 aware of appt

## 2012-08-19 NOTE — Patient Instructions (Addendum)
INR is elevated today Hold coumadin today and tomorrow (thursday/friday) Take 1/2 tablet on Saturday Then resume 1 tablet daily starting on Sunday  Will see you next week in the infusion area

## 2012-08-19 NOTE — Patient Instructions (Addendum)
Continue with your weekly chemotherapy as scheduled Follow up in 3 weeks

## 2012-08-19 NOTE — Telephone Encounter (Signed)
Per staff message and POF I have scheduled appts.  JMW  

## 2012-08-19 NOTE — Progress Notes (Signed)
Pt not scheduled for coumadin clinic today but family requested INR to be checked and pt was seen in the infusion area. INR elevated and could be due to diet changing and eating less greens the past week or so. No missed/extra doses and no bleeding or bruising noted. Instructed patient to hold coumadin x 2 days and then take 1/2 tablet on Saturday and then resume 1 tablet (5mg ) daily starting on Sunday. Return next week on 12/26 (pt already scheduled to be seen in clinic)  Christell Faith, PharmD

## 2012-08-19 NOTE — Patient Instructions (Addendum)
[facility] Discharge Instructions for Patients Receiving Chemotherapy  Today you received the following chemotherapy agents Velcade  To help prevent nausea and vomiting after your treatment, we encourage you to take your nausea medication as prescribed.   If you develop nausea and vomiting that is not controlled by your nausea medication, call the clinic. If it is after clinic hours your family physician or the after hours number for the clinic or go to the Emergency Department.   BELOW ARE SYMPTOMS THAT SHOULD BE REPORTED IMMEDIATELY:  *FEVER GREATER THAN 100.5 F  *CHILLS WITH OR WITHOUT FEVER  NAUSEA AND VOMITING THAT IS NOT CONTROLLED WITH YOUR NAUSEA MEDICATION  *UNUSUAL SHORTNESS OF BREATH  *UNUSUAL BRUISING OR BLEEDING  TENDERNESS IN MOUTH AND THROAT WITH OR WITHOUT PRESENCE OF ULCERS  *URINARY PROBLEMS  *BOWEL PROBLEMS  UNUSUAL RASH Items with * indicate a potential emergency and should be followed up as soon as possible.  Feel free to call the clinic you have any questions or concerns. The clinic phone number is [phone].   I have been informed and understand all the instructions given to me. I know to contact the clinic, my physician, or go to the Emergency Department if any problems should occur. I do not have any questions at this time, but understand that I may call the clinic during office hours   should I have any questions or need assistance in obtaining follow up care.    

## 2012-08-20 NOTE — Progress Notes (Signed)
[facility] Telephone:([phone]   Fax:(336) [phone]  OFFICE PROGRESS NOTE  [name], MD [address] [place] [address]  DIAGNOSIS:  1. multiple myeloma diagnosed in September 2009,  2. history of bilateral lower extremity deep vein thrombosis diagnosed in November 2009  3. acute on chronic deep vein thrombosis in the left lower extremity diagnosed in October 2010.   PRIOR THERAPY:  1. status post palliative radiotherapy to the right femur and is she'll tuberosity, first was done in December 2011 and the second treatment was completed Jan 08 2011, and the third treatment to the right distal femur completed on 04/30/2011  2. status post palliative radiotherapy to the left proximal femur completed 02/28/2011.  3. Revlimid 25 mg by mouth daily for 21 days every 4 weeks in addition to oral Decadron at 40 mg by mouth on a weekly basis status post 37 cycles.   CURRENT THERAPY:  1. weekly subcutaneous Velcade 1.3 mg/M2 status post 49 cycles.  2. Coumadin 5 mg on Mondays and Thursdays and 2.5 mg on all other days.  3. Zometa 4 mg IV given every 2 months.   INTERVAL HISTORY: [name] 76 y.o. female returns to the clinic today for followup visit accompanied by her daughters. The patient is feeling fine today with no specific complaints. She denied having any significant weight loss or night sweats. She has no bleeding issues. She is tolerating her treatment with subcutaneous Velcade fairly well with no significant adverse effects. The patient denied having any significant chest pain, shortness breath, cough or hemoptysis. She does not require any prescription refills today.  MEDICAL HISTORY: Past Medical History  Diagnosis Date  . Blood transfusion   . Depression     Mild  . History of chicken pox   . Hyperlipidemia   . Hypertension   . Degenerative joint disease   . Diabetes mellitus     Steroid related  . Phlebitis   . Multiple  myeloma(203.0)     per Dr. [name], s/p palliative radiation for leg pain 2011 per Dr. Roselind Messier  . Deep vein thrombosis of bilateral lower extremities     ALLERGIES:   has no known allergies.  MEDICATIONS:  Current Outpatient Prescriptions  Medication Sig Dispense Refill  . acyclovir (ZOVIRAX) 400 MG tablet Take 1 tablet (400 mg total) by mouth 2 (two) times daily.  60 tablet  2  . ALPRAZolam (XANAX) 0.5 MG tablet Take 1 tablet (0.5 mg total) by mouth 3 (three) times daily as needed for sleep or anxiety.  60 tablet  1  . Calcium Carbonate-Vitamin D 600-400 MG-UNIT per tablet Take 1 tablet by mouth 3 (three) times daily with meals.        Marland Kitchen dexamethasone (DECADRON) 4 MG tablet Take 10 tablets by mouth once a week  40 tablet  3  . gabapentin (NEURONTIN) 100 MG capsule 100 mg. Takes 3  ( 300 mg) by mouth TID      . glipiZIDE (GLUCOTROL XL) 2.5 MG 24 hr tablet Take 1 tablet (2.5 mg total) by mouth daily.  90 tablet  2  . glucose blood (ONE TOUCH ULTRA TEST) test strip Use as instructed  25 each  6  . ibuprofen (ADVIL,MOTRIN) 200 MG tablet Take 200 mg by mouth every 6 (six) hours as needed.        Marland Kitchen KLOR-CON M20 20 MEQ tablet TAKE ONE TABLET BY MOUTH EVERY DAY  30 each  1  . Lancets (ONETOUCH ULTRASOFT) lancets Test blood sugar once daily or as directed.  100 each  11  . lidocaine (LIDODERM) 5 % Place 1 patch onto the skin daily. Remove & Discard patch within 12 hours or as directed by MD  30 patch  0  . lisinopril-hydrochlorothiazide (PRINZIDE,ZESTORETIC) 20-12.5 MG per tablet Take 0.5 tablets by mouth daily.       . magnesium oxide (MAG-OX) 400 MG tablet Take 400 mg by mouth daily.       Marland Kitchen oxyCODONE (OXY IR/ROXICODONE) 5 MG immediate release tablet Take 1 1/2 tabs by mouth every 6 hours as needed for pain  90 tablet  0  . oxyCODONE (OXYCONTIN) 10 MG 12 hr tablet Take 1 tablet (10 mg total) by mouth every 12 (twelve) hours.  60 tablet  0  . potassium chloride SA (K-DUR,KLOR-CON) 20 MEQ tablet Take  1 tablet (20 mEq total) by mouth daily.  7 tablet  0  . potassium chloride SA (K-DUR,KLOR-CON) 20 MEQ tablet Take 1 tablet (20 mEq total) by mouth daily.  7 tablet  0  . prochlorperazine (COMPAZINE) 10 MG tablet       . prochlorperazine (COMPAZINE) 10 MG tablet Take 1 tablet (10 mg total) by mouth every 6 (six) hours as needed.  30 tablet  1  . temazepam (RESTORIL) 30 MG capsule Take 1 capsule (30 mg total) by mouth at bedtime as needed for sleep.  30 capsule  1  . warfarin (COUMADIN) 5 MG tablet TAKE ONE TABLET BY MOUTH ONCE DAILY ON TUESDAY, THURSDAY AND SATURDAY, TAKE 1/2 TABLET ON ALL OTHER DAYS OR AS DIRECTED  60 tablet  1   No current facility-administered medications for this visit.   Facility-Administered Medications Ordered in Other Visits  Medication Dose Route Frequency Provider Last Rate Last Dose  . Zoledronic Acid (ZOMETA) 4 mg IVPB  4 mg Intravenous Once Si Gaul, MD        SURGICAL HISTORY:  Past Surgical History  Procedure Date  . Abdominal hysterectomy     REVIEW OF SYSTEMS:  A comprehensive review of systems was negative.   PHYSICAL EXAMINATION: General appearance: alert, cooperative and no distress Head: Normocephalic, without obvious abnormality, atraumatic Neck: no adenopathy Resp: clear to auscultation bilaterally Cardio: regular rate and rhythm, S1, S2 normal, no murmur, click, rub or gallop GI: soft, non-tender; bowel sounds normal; no masses,  no organomegaly Extremities: extremities normal, atraumatic, no cyanosis or edema Neurologic: Alert and oriented X 3, normal strength and tone. Normal symmetric reflexes. Normal coordination and gait  ECOG PERFORMANCE STATUS: 1 - Symptomatic but completely ambulatory  Blood pressure 116/68, pulse 62, temperature 97.7 F (36.5 C), temperature source Oral, resp. rate 20, height 5' (1.524 m), weight 129 lb 8 oz (58.741 kg).  LABORATORY DATA: Lab Results  Component Value Date   WBC 4.2 08/19/2012   HGB 12.7  08/19/2012   HCT 39.9 08/19/2012   MCV 93.4 08/19/2012   PLT 217 08/19/2012      Chemistry      Component Value Date/Time   NA 142 08/19/2012 1154   NA 141 06/24/2012 1620   K 3.8 08/19/2012 1154   K 3.1* 06/24/2012 1620   CL 103 08/19/2012 1154   CL 101 06/24/2012 1620   CO2 32* 08/19/2012 1154   CO2 32 06/24/2012 1620   BUN 18.0 08/19/2012 1154   BUN 15 06/24/2012 1620   CREATININE 1.0 08/19/2012 1154   CREATININE 0.96 06/24/2012 1620  Component Value Date/Time   CALCIUM 9.1 08/19/2012 1154   CALCIUM 9.4 06/24/2012 1620   CALCIUM  Value: 5.7 CRITICAL RESULT CALLED TO, READ BACK BY AND VERIFIED WITH: JAMIE TRACY,RN [id number] @ 1433 BY J SCOTTON (NOTE)  Amended report. Result repeated and verified. CORRECTED ON 10/14 AT 1429: PREVIOUSLY REPORTED AS Result repeated and verified.* 06/13/2008 1605   ALKPHOS 58 08/19/2012 1154   ALKPHOS 50 06/24/2012 1620   AST 20 08/19/2012 1154   AST 19 06/24/2012 1620   ALT 18 08/19/2012 1154   ALT 15 06/24/2012 1620   BILITOT 0.27 08/19/2012 1154   BILITOT 0.2* 06/24/2012 1620       RADIOGRAPHIC STUDIES: Dg Chest 2 View  07/01/2012  *RADIOLOGY REPORT*  Clinical Data: Pneumonia follow up.  History of multiple myeloma. Ex-smoker.  History of hypertension and diabetes.  CHEST - 2 VIEW  Comparison: 05/13/2012. Bone survey 03/12/2011.  Findings: There is borderline cardiac silhouette enlargement. Ectasia and tortuosity of thoracic aorta is present.  The infiltrative density has diminished in the right medial base since previous study.  On the lateral image the infiltrative density in the retrocardiac region has diminished.  There is slight residual haziness without consolidation. There is minimal central peribronchial thickening.  No pleural effusion is evident.  There is slight scoliosis.  There is osteopenic appearance of the bones with changes of degenerative spondylosis.  Compression fractures of the T5 and T7 vertebral bodies are seen  without significant change. Compression of the superior aspect of the L3 vertebral body is also evident.  It is unchanged.  IMPRESSION: Borderline cardiac silhouette enlargement.  Decrease in the right medial basilar and retrocardiac infiltrative densities since prior study.  Slight residual haziness is seen with no evidence consolidation. Minimal central peribronchial thickening.  Chronic bony abnormalities are described and detailed above without significant change from most recent prior examinations.   Original Report Authenticated By: Onalee Hua Call     ASSESSMENT/PLAN: This is a very pleasant 76 years old African American female with history of multiple myeloma currently on treatment with subcutaneous Velcade and tolerating it fairly well. The patient has no evidence for disease progression on his recent blood work. The patient was discussed with Dr. [name]. She will continue with her weekly subcutaneous Velcade. She'll followup in 3 weeks with repeat CBC differential, C. met and LDH. Her INR is supratherapeutic today at 4.3. She is not having any bleeding or bruising. This will be addressed and managed by the Coumadin clinic today.  She will continue followup with the [facility] and take her Coumadin per their instructions.   Laural Benes, Tatsuo Musial E, PA-C   All questions were answered. The patient knows to call the clinic with any problems, questions or concerns. We can certainly see the patient much sooner if necessary.  I spent 20 minutes counseling the patient face to face. The total time spent in the appointment was 30 minutes.

## 2012-08-23 ENCOUNTER — Other Ambulatory Visit: Payer: Self-pay | Admitting: Internal Medicine

## 2012-08-26 ENCOUNTER — Other Ambulatory Visit (HOSPITAL_BASED_OUTPATIENT_CLINIC_OR_DEPARTMENT_OTHER): Payer: Medicare Other

## 2012-08-26 ENCOUNTER — Other Ambulatory Visit: Payer: Self-pay | Admitting: Internal Medicine

## 2012-08-26 ENCOUNTER — Ambulatory Visit: Payer: Medicare Other | Admitting: Pharmacist

## 2012-08-26 ENCOUNTER — Ambulatory Visit (HOSPITAL_BASED_OUTPATIENT_CLINIC_OR_DEPARTMENT_OTHER): Payer: Medicare Other

## 2012-08-26 VITALS — BP 131/71 | HR 65 | Temp 97.4°F | Resp 18

## 2012-08-26 DIAGNOSIS — Z5112 Encounter for antineoplastic immunotherapy: Secondary | ICD-10-CM

## 2012-08-26 DIAGNOSIS — I82409 Acute embolism and thrombosis of unspecified deep veins of unspecified lower extremity: Secondary | ICD-10-CM

## 2012-08-26 DIAGNOSIS — C9000 Multiple myeloma not having achieved remission: Secondary | ICD-10-CM

## 2012-08-26 LAB — COMPREHENSIVE METABOLIC PANEL (CC13)
CO2: 29 mEq/L (ref 22–29)
Calcium: 9.8 mg/dL (ref 8.4–10.4)
Chloride: 107 mEq/L (ref 98–107)
Creatinine: 1.2 mg/dL — ABNORMAL HIGH (ref 0.6–1.1)
Glucose: 78 mg/dl (ref 70–99)
Total Bilirubin: 0.25 mg/dL (ref 0.20–1.20)
Total Protein: 5.8 g/dL — ABNORMAL LOW (ref 6.4–8.3)

## 2012-08-26 LAB — CBC WITH DIFFERENTIAL/PLATELET
Basophils Absolute: 0 10*3/uL (ref 0.0–0.1)
Eosinophils Absolute: 0.1 10*3/uL (ref 0.0–0.5)
HCT: 36.1 % (ref 34.8–46.6)
HGB: 11.9 g/dL (ref 11.6–15.9)
LYMPH%: 9.4 % — ABNORMAL LOW (ref 14.0–49.7)
MONO#: 0.5 10*3/uL (ref 0.1–0.9)
NEUT#: 3 10*3/uL (ref 1.5–6.5)
NEUT%: 75.1 % (ref 38.4–76.8)
Platelets: 180 10*3/uL (ref 145–400)
WBC: 4 10*3/uL (ref 3.9–10.3)

## 2012-08-26 LAB — POCT INR: INR: 1.3

## 2012-08-26 MED ORDER — BORTEZOMIB CHEMO SQ INJECTION 3.5 MG (2.5MG/ML)
1.3000 mg/m2 | Freq: Once | INTRAMUSCULAR | Status: AC
  Administered 2012-08-26: 2 mg via SUBCUTANEOUS
  Filled 2012-08-26: qty 2

## 2012-08-26 MED ORDER — ONDANSETRON HCL 8 MG PO TABS
8.0000 mg | ORAL_TABLET | Freq: Once | ORAL | Status: AC
  Administered 2012-08-26: 8 mg via ORAL

## 2012-08-26 NOTE — Progress Notes (Signed)
INR = 1.3 after holding doses on 12/19 & 12/20 for supratherapeutic INR.  She took 2.5 mg as directed on 12/21 then on 12/22 she restarted 5 mg/day. No reported bleeding. No SOB, LE swelling or sxs of VTE. INR low due to holding/decreasing doses.  INR was supratherapeutic, perhaps due to change in "shingles pill."  Pt & her dtr report that pt is now back on previous shingles medication & previous dose. Continue Coumadin 5 mg/day. Repeat INR next week.  We will see pt during infusion. Marily Lente, Pharm.D.

## 2012-08-27 ENCOUNTER — Other Ambulatory Visit: Payer: Self-pay | Admitting: *Deleted

## 2012-08-27 ENCOUNTER — Other Ambulatory Visit: Payer: Self-pay | Admitting: Internal Medicine

## 2012-08-30 ENCOUNTER — Other Ambulatory Visit: Payer: Self-pay | Admitting: *Deleted

## 2012-08-30 DIAGNOSIS — C9000 Multiple myeloma not having achieved remission: Secondary | ICD-10-CM

## 2012-08-30 MED ORDER — OXYCODONE HCL 5 MG PO TABS: ORAL_TABLET | ORAL | Status: DC

## 2012-09-02 ENCOUNTER — Other Ambulatory Visit: Payer: Self-pay | Admitting: Physician Assistant

## 2012-09-02 ENCOUNTER — Ambulatory Visit (HOSPITAL_BASED_OUTPATIENT_CLINIC_OR_DEPARTMENT_OTHER): Payer: Medicare Other

## 2012-09-02 ENCOUNTER — Ambulatory Visit: Payer: Medicare Other | Admitting: Pharmacist

## 2012-09-02 ENCOUNTER — Other Ambulatory Visit (HOSPITAL_BASED_OUTPATIENT_CLINIC_OR_DEPARTMENT_OTHER): Payer: Medicare Other | Admitting: Lab

## 2012-09-02 VITALS — BP 161/68 | HR 61 | Temp 98.7°F

## 2012-09-02 DIAGNOSIS — C9000 Multiple myeloma not having achieved remission: Secondary | ICD-10-CM

## 2012-09-02 DIAGNOSIS — Z5112 Encounter for antineoplastic immunotherapy: Secondary | ICD-10-CM

## 2012-09-02 DIAGNOSIS — I82409 Acute embolism and thrombosis of unspecified deep veins of unspecified lower extremity: Secondary | ICD-10-CM

## 2012-09-02 LAB — CBC WITH DIFFERENTIAL/PLATELET
BASO%: 0.2 % (ref 0.0–2.0)
EOS%: 1.2 % (ref 0.0–7.0)
LYMPH%: 15.2 % (ref 14.0–49.7)
MCHC: 32.7 g/dL (ref 31.5–36.0)
MCV: 93.3 fL (ref 79.5–101.0)
MONO%: 10.4 % (ref 0.0–14.0)
Platelets: 196 10*3/uL (ref 145–400)
RBC: 4.16 10*6/uL (ref 3.70–5.45)
RDW: 15 % — ABNORMAL HIGH (ref 11.2–14.5)

## 2012-09-02 LAB — COMPREHENSIVE METABOLIC PANEL (CC13)
ALT: 18 U/L (ref 0–55)
AST: 19 U/L (ref 5–34)
Alkaline Phosphatase: 61 U/L (ref 40–150)
Potassium: 3.8 mEq/L (ref 3.5–5.1)
Sodium: 141 mEq/L (ref 136–145)
Total Bilirubin: 0.23 mg/dL (ref 0.20–1.20)
Total Protein: 6.3 g/dL — ABNORMAL LOW (ref 6.4–8.3)

## 2012-09-02 MED ORDER — ONDANSETRON HCL 8 MG PO TABS
8.0000 mg | ORAL_TABLET | Freq: Once | ORAL | Status: AC
  Administered 2012-09-02: 8 mg via ORAL

## 2012-09-02 MED ORDER — BORTEZOMIB CHEMO SQ INJECTION 3.5 MG (2.5MG/ML)
1.3000 mg/m2 | Freq: Once | INTRAMUSCULAR | Status: AC
  Administered 2012-09-02: 2 mg via SUBCUTANEOUS
  Filled 2012-09-02: qty 2

## 2012-09-02 NOTE — Patient Instructions (Signed)
[facility] Discharge Instructions for Patients Receiving Chemotherapy  Today you received the following chemotherapy agents Velcade  To help prevent nausea and vomiting after your treatment, we encourage you to take your nausea medication as prescribed.   If you develop nausea and vomiting that is not controlled by your nausea medication, call the clinic. If it is after clinic hours your family physician or the after hours number for the clinic or go to the Emergency Department.   BELOW ARE SYMPTOMS THAT SHOULD BE REPORTED IMMEDIATELY:  *FEVER GREATER THAN 100.5 F  *CHILLS WITH OR WITHOUT FEVER  NAUSEA AND VOMITING THAT IS NOT CONTROLLED WITH YOUR NAUSEA MEDICATION  *UNUSUAL SHORTNESS OF BREATH  *UNUSUAL BRUISING OR BLEEDING  TENDERNESS IN MOUTH AND THROAT WITH OR WITHOUT PRESENCE OF ULCERS  *URINARY PROBLEMS  *BOWEL PROBLEMS  UNUSUAL RASH Items with * indicate a potential emergency and should be followed up as soon as possible.  Feel free to call the clinic you have any questions or concerns. The clinic phone number is [phone].   I have been informed and understand all the instructions given to me. I know to contact the clinic, my physician, or go to the Emergency Department if any problems should occur. I do not have any questions at this time, but understand that I may call the clinic during office hours   should I have any questions or need assistance in obtaining follow up care.    

## 2012-09-02 NOTE — Progress Notes (Unsigned)
INR back at goal range.  No changes to report by pt.  Continue Coumadin 5mg  daily.  Will check PT/INR in 2 weeks.

## 2012-09-04 ENCOUNTER — Telehealth: Payer: Self-pay | Admitting: Internal Medicine

## 2012-09-04 NOTE — Telephone Encounter (Signed)
lmonvm of the pt's dtr andrea regarding the changes in the pt's schedule times on 09/16/2012 due to the pa will be out of the office

## 2012-09-09 ENCOUNTER — Other Ambulatory Visit (HOSPITAL_BASED_OUTPATIENT_CLINIC_OR_DEPARTMENT_OTHER): Payer: Medicare Other | Admitting: Lab

## 2012-09-09 ENCOUNTER — Ambulatory Visit (HOSPITAL_BASED_OUTPATIENT_CLINIC_OR_DEPARTMENT_OTHER): Payer: Medicare Other

## 2012-09-09 VITALS — BP 149/71 | HR 70 | Temp 98.0°F | Resp 18

## 2012-09-09 DIAGNOSIS — C9000 Multiple myeloma not having achieved remission: Secondary | ICD-10-CM

## 2012-09-09 DIAGNOSIS — Z5112 Encounter for antineoplastic immunotherapy: Secondary | ICD-10-CM

## 2012-09-09 LAB — COMPREHENSIVE METABOLIC PANEL (CC13)
AST: 20 U/L (ref 5–34)
Albumin: 3.1 g/dL — ABNORMAL LOW (ref 3.5–5.0)
Alkaline Phosphatase: 61 U/L (ref 40–150)
BUN: 14 mg/dL (ref 7.0–26.0)
Calcium: 9.5 mg/dL (ref 8.4–10.4)
Chloride: 104 mEq/L (ref 98–107)
Glucose: 127 mg/dl — ABNORMAL HIGH (ref 70–99)
Potassium: 3.5 mEq/L (ref 3.5–5.1)
Sodium: 142 mEq/L (ref 136–145)
Total Protein: 6.6 g/dL (ref 6.4–8.3)

## 2012-09-09 LAB — CBC WITH DIFFERENTIAL/PLATELET
Basophils Absolute: 0 10*3/uL (ref 0.0–0.1)
EOS%: 1.4 % (ref 0.0–7.0)
Eosinophils Absolute: 0.1 10*3/uL (ref 0.0–0.5)
HGB: 12.6 g/dL (ref 11.6–15.9)
MONO%: 9.2 % (ref 0.0–14.0)
NEUT#: 2.6 10*3/uL (ref 1.5–6.5)
RBC: 4.22 10*6/uL (ref 3.70–5.45)
RDW: 14.1 % (ref 11.2–14.5)
lymph#: 0.6 10*3/uL — ABNORMAL LOW (ref 0.9–3.3)

## 2012-09-09 MED ORDER — BORTEZOMIB CHEMO SQ INJECTION 3.5 MG (2.5MG/ML)
1.3000 mg/m2 | Freq: Once | INTRAMUSCULAR | Status: AC
  Administered 2012-09-09: 2 mg via SUBCUTANEOUS
  Filled 2012-09-09: qty 2

## 2012-09-09 MED ORDER — ONDANSETRON HCL 8 MG PO TABS
8.0000 mg | ORAL_TABLET | Freq: Once | ORAL | Status: AC
  Administered 2012-09-09: 8 mg via ORAL

## 2012-09-09 NOTE — Patient Instructions (Signed)
[facility] Discharge Instructions for Patients Receiving Chemotherapy  Today you received the following chemotherapy agents Velcade.  To help prevent nausea and vomiting after your treatment, we encourage you to take your nausea medication as prescribed.   If you develop nausea and vomiting that is not controlled by your nausea medication, call the clinic. If it is after clinic hours your family physician or the after hours number for the clinic or go to the Emergency Department.   BELOW ARE SYMPTOMS THAT SHOULD BE REPORTED IMMEDIATELY:  *FEVER GREATER THAN 100.5 F  *CHILLS WITH OR WITHOUT FEVER  NAUSEA AND VOMITING THAT IS NOT CONTROLLED WITH YOUR NAUSEA MEDICATION  *UNUSUAL SHORTNESS OF BREATH  *UNUSUAL BRUISING OR BLEEDING  TENDERNESS IN MOUTH AND THROAT WITH OR WITHOUT PRESENCE OF ULCERS  *URINARY PROBLEMS  *BOWEL PROBLEMS  UNUSUAL RASH Items with * indicate a potential emergency and should be followed up as soon as possible.  One of the nurses will contact you 24 hours after your treatment. Please let the nurse know about any problems that you may have experienced. Feel free to call the clinic you have any questions or concerns. The clinic phone number is [phone].   I have been informed and understand all the instructions given to me. I know to contact the clinic, my physician, or go to the Emergency Department if any problems should occur. I do not have any questions at this time, but understand that I may call the clinic during office hours   should I have any questions or need assistance in obtaining follow up care.    __________________________________________  _____________  __________ Signature of Patient or Authorized Representative            Date                   Time    __________________________________________ Nurse's Signature    

## 2012-09-13 ENCOUNTER — Other Ambulatory Visit: Payer: Self-pay | Admitting: *Deleted

## 2012-09-13 DIAGNOSIS — C9000 Multiple myeloma not having achieved remission: Secondary | ICD-10-CM

## 2012-09-13 MED ORDER — OXYCODONE HCL 10 MG PO TB12: 10.0000 mg | ORAL_TABLET | Freq: Two times a day (BID) | ORAL | Status: DC

## 2012-09-16 ENCOUNTER — Ambulatory Visit (HOSPITAL_BASED_OUTPATIENT_CLINIC_OR_DEPARTMENT_OTHER): Payer: Medicare Other

## 2012-09-16 ENCOUNTER — Ambulatory Visit: Payer: Medicare Other | Admitting: Pharmacist

## 2012-09-16 ENCOUNTER — Other Ambulatory Visit: Payer: Self-pay | Admitting: Internal Medicine

## 2012-09-16 ENCOUNTER — Ambulatory Visit: Payer: Medicare Other | Admitting: Physician Assistant

## 2012-09-16 ENCOUNTER — Telehealth: Payer: Self-pay | Admitting: Internal Medicine

## 2012-09-16 ENCOUNTER — Telehealth: Payer: Self-pay | Admitting: *Deleted

## 2012-09-16 ENCOUNTER — Other Ambulatory Visit (HOSPITAL_BASED_OUTPATIENT_CLINIC_OR_DEPARTMENT_OTHER): Payer: Medicare Other | Admitting: Lab

## 2012-09-16 ENCOUNTER — Ambulatory Visit (HOSPITAL_BASED_OUTPATIENT_CLINIC_OR_DEPARTMENT_OTHER): Payer: Medicare Other | Admitting: Nurse Practitioner

## 2012-09-16 VITALS — BP 129/80 | HR 76 | Temp 97.6°F | Resp 18 | Ht 60.0 in | Wt 128.5 lb

## 2012-09-16 DIAGNOSIS — C9000 Multiple myeloma not having achieved remission: Secondary | ICD-10-CM

## 2012-09-16 DIAGNOSIS — Z5112 Encounter for antineoplastic immunotherapy: Secondary | ICD-10-CM

## 2012-09-16 DIAGNOSIS — I82409 Acute embolism and thrombosis of unspecified deep veins of unspecified lower extremity: Secondary | ICD-10-CM

## 2012-09-16 DIAGNOSIS — Z86718 Personal history of other venous thrombosis and embolism: Secondary | ICD-10-CM

## 2012-09-16 LAB — CBC WITH DIFFERENTIAL/PLATELET
BASO%: 0.4 % (ref 0.0–2.0)
Basophils Absolute: 0 10*3/uL (ref 0.0–0.1)
EOS%: 1.8 % (ref 0.0–7.0)
HCT: 39.5 % (ref 34.8–46.6)
HGB: 13.3 g/dL (ref 11.6–15.9)
LYMPH%: 13.8 % — ABNORMAL LOW (ref 14.0–49.7)
MCH: 30.5 pg (ref 25.1–34.0)
MCHC: 33.6 g/dL (ref 31.5–36.0)
MCV: 90.9 fL (ref 79.5–101.0)
MONO%: 12 % (ref 0.0–14.0)
NEUT%: 72 % (ref 38.4–76.8)

## 2012-09-16 LAB — COMPREHENSIVE METABOLIC PANEL (CC13)
CO2: 27 mEq/L (ref 22–29)
Creatinine: 1 mg/dL (ref 0.6–1.1)
Glucose: 176 mg/dl — ABNORMAL HIGH (ref 70–99)
Total Bilirubin: 0.23 mg/dL (ref 0.20–1.20)

## 2012-09-16 LAB — PROTIME-INR

## 2012-09-16 MED ORDER — OXYCODONE HCL 5 MG PO TABS: ORAL_TABLET | ORAL | Status: DC

## 2012-09-16 MED ORDER — BORTEZOMIB CHEMO SQ INJECTION 3.5 MG (2.5MG/ML)
1.3000 mg/m2 | Freq: Once | INTRAMUSCULAR | Status: AC
  Administered 2012-09-16: 2 mg via SUBCUTANEOUS
  Filled 2012-09-16: qty 2

## 2012-09-16 MED ORDER — ONDANSETRON HCL 8 MG PO TABS
8.0000 mg | ORAL_TABLET | Freq: Once | ORAL | Status: AC
  Administered 2012-09-16: 8 mg via ORAL

## 2012-09-16 NOTE — Telephone Encounter (Signed)
appts made and printed,email to mw to add tx

## 2012-09-16 NOTE — Telephone Encounter (Signed)
Per staff message and POF I have scheduled appts.  JMW  

## 2012-09-16 NOTE — Patient Instructions (Addendum)
[facility] Discharge Instructions for Patients Receiving Chemotherapy  Today you received the following chemotherapy agents: Velcade. To help prevent nausea and vomiting after your treatment, we encourage you to take your nausea medication.  If you develop nausea and vomiting that is not controlled by your nausea medication, call the clinic.   BELOW ARE SYMPTOMS THAT SHOULD BE REPORTED IMMEDIATELY:  *FEVER GREATER THAN 100.5 F  *CHILLS WITH OR WITHOUT FEVER  NAUSEA AND VOMITING THAT IS NOT CONTROLLED WITH YOUR NAUSEA MEDICATION  *UNUSUAL SHORTNESS OF BREATH  *UNUSUAL BRUISING OR BLEEDING  TENDERNESS IN MOUTH AND THROAT WITH OR WITHOUT PRESENCE OF ULCERS  *URINARY PROBLEMS  *BOWEL PROBLEMS  UNUSUAL RASH Items with * indicate a potential emergency and should be followed up as soon as possible.  Feel free to call the clinic you have any questions or concerns. The clinic phone number is [phone].    

## 2012-09-16 NOTE — Progress Notes (Signed)
INR = 3.1 on 5 mg/day Doing fine.  I spoke w/ pts dtr today.  No reports of problems re: anticoag. No change to Coumadin dose. Repeat INR 09/30/12.  Can see in infusion. Marily Lente, Pharm.D.

## 2012-09-16 NOTE — Progress Notes (Signed)
OFFICE PROGRESS NOTE  Interval history:  [name] is a 77 year old woman with long-standing multiple myeloma followed by Dr. [name] currently on active treatment with weekly Velcade/dexamethasone. She receives Zometa on a 2 month schedule. She also has a history of lower extremity DVTs and is maintained on chronic Coumadin anticoagulation.  She is seen today for scheduled followup. She reports chronic lower back and left leg pain related to a previous herpes zoster infection. She also has some pain in the right leg. She is currently on OxyContin 10 mg every 12 hours and takes oxycodone as needed for breakthrough pain. She denies nausea/vomiting. No mouth sores. No diarrhea. Overall she has a good appetite. She denies fever, cough, shortness of breath. No bleeding.   Objective: Blood pressure 129/80, pulse 76, temperature 97.6 F (36.4 C), temperature source Oral, resp. rate 18, height 5' (1.524 m), weight 128 lb 8 oz (58.287 kg).  Oropharynx is without thrush or ulceration. Lungs are clear. Regular cardiac rhythm. Abdomen is soft and nontender. No organomegaly. Extremities are without edema.  Lab Results: Lab Results  Component Value Date   WBC 3.2* 09/16/2012   HGB 13.3 09/16/2012   HCT 39.5 09/16/2012   MCV 90.9 09/16/2012   PLT 191 09/16/2012    Chemistry:    Chemistry      Component Value Date/Time   NA 141 09/16/2012 1423   NA 141 06/24/2012 1620   K 3.4* 09/16/2012 1423   K 3.1* 06/24/2012 1620   CL 104 09/16/2012 1423   CL 101 06/24/2012 1620   CO2 27 09/16/2012 1423   CO2 32 06/24/2012 1620   BUN 19.0 09/16/2012 1423   BUN 15 06/24/2012 1620   CREATININE 1.0 09/16/2012 1423   CREATININE 0.96 06/24/2012 1620      Component Value Date/Time   CALCIUM 9.6 09/16/2012 1423   CALCIUM 9.4 06/24/2012 1620   CALCIUM  Value: 5.7 CRITICAL RESULT CALLED TO, READ BACK BY AND VERIFIED WITH: JAMIE TRACY,RN [id number] @ 1433 BY J SCOTTON (NOTE)  Amended report. Result repeated and verified.  CORRECTED ON 10/14 AT 1429: PREVIOUSLY REPORTED AS Result repeated and verified.* 06/13/2008 1605   ALKPHOS 55 09/16/2012 1423   ALKPHOS 50 06/24/2012 1620   AST 17 09/16/2012 1423   AST 19 06/24/2012 1620   ALT 15 09/16/2012 1423   ALT 15 06/24/2012 1620   BILITOT 0.23 09/16/2012 1423   BILITOT 0.2* 06/24/2012 1620       Studies/Results: No results found.  Medications: I have reviewed the patient's current medications.  Assessment/Plan:  1. Multiple myeloma on active treatment with weekly Velcade/dexamethasone. She receives Zometa every 2 months. 2. History of lower extremity DVTs. She is maintained on Coumadin followed through the Coumadin clinic in our office.  Disposition-Ms. Tagliaferri appear stable. Plan to continue with weekly Velcade/dexamethasone and every 2 month Zometa. She will return for a followup visit with Dr. [name] in one month. Repeat myeloma labs will be performed one week prior to the office visit. She will contact the office prior to her next visit with any problems.  Plan reviewed with Dr. [name].  [name] ANP/GNP-BC

## 2012-09-17 ENCOUNTER — Other Ambulatory Visit: Payer: Self-pay | Admitting: Certified Registered Nurse Anesthetist

## 2012-09-23 ENCOUNTER — Other Ambulatory Visit: Payer: Self-pay | Admitting: Physician Assistant

## 2012-09-23 ENCOUNTER — Other Ambulatory Visit (HOSPITAL_BASED_OUTPATIENT_CLINIC_OR_DEPARTMENT_OTHER): Payer: Medicare Other | Admitting: Lab

## 2012-09-23 ENCOUNTER — Ambulatory Visit (HOSPITAL_BASED_OUTPATIENT_CLINIC_OR_DEPARTMENT_OTHER): Payer: Medicare Other

## 2012-09-23 VITALS — BP 127/68 | HR 73 | Temp 98.4°F

## 2012-09-23 DIAGNOSIS — I82409 Acute embolism and thrombosis of unspecified deep veins of unspecified lower extremity: Secondary | ICD-10-CM

## 2012-09-23 DIAGNOSIS — C9000 Multiple myeloma not having achieved remission: Secondary | ICD-10-CM

## 2012-09-23 DIAGNOSIS — Z5112 Encounter for antineoplastic immunotherapy: Secondary | ICD-10-CM

## 2012-09-23 LAB — CBC WITH DIFFERENTIAL/PLATELET
BASO%: 0.2 % (ref 0.0–2.0)
EOS%: 2 % (ref 0.0–7.0)
HCT: 38.3 % (ref 34.8–46.6)
LYMPH%: 12.3 % — ABNORMAL LOW (ref 14.0–49.7)
MCH: 29.9 pg (ref 25.1–34.0)
MCHC: 32.8 g/dL (ref 31.5–36.0)
MCV: 91.1 fL (ref 79.5–101.0)
MONO#: 0.4 10*3/uL (ref 0.1–0.9)
MONO%: 13.8 % (ref 0.0–14.0)
NEUT%: 71.7 % (ref 38.4–76.8)
Platelets: 183 10*3/uL (ref 145–400)
RBC: 4.21 10*6/uL (ref 3.70–5.45)
WBC: 3.2 10*3/uL — ABNORMAL LOW (ref 3.9–10.3)

## 2012-09-23 LAB — PROTIME-INR
INR: 2 (ref 2.00–3.50)
Protime: 24 Seconds — ABNORMAL HIGH (ref 10.6–13.4)

## 2012-09-23 LAB — BASIC METABOLIC PANEL (CC13)
BUN: 16 mg/dL (ref 7.0–26.0)
Creatinine: 1.1 mg/dL (ref 0.6–1.1)
Potassium: 3.9 mEq/L (ref 3.5–5.1)

## 2012-09-23 MED ORDER — SODIUM CHLORIDE 0.9 % IJ SOLN
10.0000 mL | INTRAMUSCULAR | Status: DC | PRN
  Filled 2012-09-23: qty 10

## 2012-09-23 MED ORDER — BORTEZOMIB CHEMO SQ INJECTION 3.5 MG (2.5MG/ML)
1.3000 mg/m2 | Freq: Once | INTRAMUSCULAR | Status: AC
  Administered 2012-09-23: 2 mg via SUBCUTANEOUS
  Filled 2012-09-23: qty 2

## 2012-09-23 MED ORDER — ONDANSETRON HCL 8 MG PO TABS
8.0000 mg | ORAL_TABLET | Freq: Once | ORAL | Status: AC
  Administered 2012-09-23: 8 mg via ORAL

## 2012-09-23 MED ORDER — ZOLEDRONIC ACID 4 MG/5ML IV CONC
4.0000 mg | Freq: Once | INTRAVENOUS | Status: AC
  Administered 2012-09-23: 4 mg via INTRAVENOUS
  Filled 2012-09-23: qty 5

## 2012-09-23 NOTE — Progress Notes (Signed)
Pt c/o neuropathic pain from shingles, states she is on neurontin 300 mg TID, just recently increased to 400 mg TID.  Pt states she is still uncomfortable.  S/w pt's daughter Sue Lush, these directions for neurontin were given and prescribed by Dr. Para March, PCP.  S/w Adrena, who states pt should call PCP office for further evaluation of pain and instructions for neurontin.  Informed Sue Lush, pt's daughter to contact PCP office, and she verbalizes understanding.

## 2012-09-23 NOTE — Patient Instructions (Signed)
[facility] Discharge Instructions for Patients Receiving Chemotherapy  Today you received the following chemotherapy agents: Velcade, Zometa  To help prevent nausea and vomiting after your treatment, we encourage you to take your nausea medication as directed by your MD.   If you develop nausea and vomiting that is not controlled by your nausea medication, call the clinic. If it is after clinic hours your family physician or the after hours number for the clinic or go to the Emergency Department.   BELOW ARE SYMPTOMS THAT SHOULD BE REPORTED IMMEDIATELY:  *FEVER GREATER THAN 100.5 F  *CHILLS WITH OR WITHOUT FEVER  NAUSEA AND VOMITING THAT IS NOT CONTROLLED WITH YOUR NAUSEA MEDICATION  *UNUSUAL SHORTNESS OF BREATH  *UNUSUAL BRUISING OR BLEEDING  TENDERNESS IN MOUTH AND THROAT WITH OR WITHOUT PRESENCE OF ULCERS  *URINARY PROBLEMS  *BOWEL PROBLEMS  UNUSUAL RASH Items with * indicate a potential emergency and should be followed up as soon as possible.  Feel free to call the clinic you have any questions or concerns. The clinic phone number is [phone].

## 2012-09-24 ENCOUNTER — Other Ambulatory Visit: Payer: Self-pay | Admitting: Physician Assistant

## 2012-09-30 ENCOUNTER — Ambulatory Visit (HOSPITAL_BASED_OUTPATIENT_CLINIC_OR_DEPARTMENT_OTHER): Payer: Medicare Other | Admitting: Pharmacist

## 2012-09-30 ENCOUNTER — Other Ambulatory Visit (HOSPITAL_BASED_OUTPATIENT_CLINIC_OR_DEPARTMENT_OTHER): Payer: Medicare Other | Admitting: Lab

## 2012-09-30 ENCOUNTER — Ambulatory Visit (HOSPITAL_BASED_OUTPATIENT_CLINIC_OR_DEPARTMENT_OTHER): Payer: Medicare Other

## 2012-09-30 VITALS — BP 129/73 | HR 72 | Temp 97.9°F | Resp 18

## 2012-09-30 DIAGNOSIS — I82409 Acute embolism and thrombosis of unspecified deep veins of unspecified lower extremity: Secondary | ICD-10-CM

## 2012-09-30 DIAGNOSIS — C9000 Multiple myeloma not having achieved remission: Secondary | ICD-10-CM

## 2012-09-30 DIAGNOSIS — Z5112 Encounter for antineoplastic immunotherapy: Secondary | ICD-10-CM

## 2012-09-30 LAB — CBC WITH DIFFERENTIAL/PLATELET
Eosinophils Absolute: 0.1 10*3/uL (ref 0.0–0.5)
MONO#: 0.3 10*3/uL (ref 0.1–0.9)
NEUT#: 2.9 10*3/uL (ref 1.5–6.5)
RBC: 4.17 10*6/uL (ref 3.70–5.45)
RDW: 14.7 % — ABNORMAL HIGH (ref 11.2–14.5)
WBC: 3.7 10*3/uL — ABNORMAL LOW (ref 3.9–10.3)

## 2012-09-30 LAB — PROTIME-INR
INR: 3.3 (ref 2.00–3.50)
Protime: 39.6 Seconds — ABNORMAL HIGH (ref 10.6–13.4)

## 2012-09-30 LAB — COMPREHENSIVE METABOLIC PANEL (CC13)
ALT: 24 U/L (ref 0–55)
AST: 26 U/L (ref 5–34)
Alkaline Phosphatase: 53 U/L (ref 40–150)
BUN: 20.5 mg/dL (ref 7.0–26.0)
Creatinine: 1.1 mg/dL (ref 0.6–1.1)

## 2012-09-30 MED ORDER — BORTEZOMIB CHEMO SQ INJECTION 3.5 MG (2.5MG/ML)
1.3000 mg/m2 | Freq: Once | INTRAMUSCULAR | Status: AC
  Administered 2012-09-30: 2 mg via SUBCUTANEOUS
  Filled 2012-09-30: qty 2

## 2012-09-30 MED ORDER — ONDANSETRON HCL 8 MG PO TABS
8.0000 mg | ORAL_TABLET | Freq: Once | ORAL | Status: AC
  Administered 2012-09-30: 8 mg via ORAL

## 2012-09-30 NOTE — Progress Notes (Signed)
09/16/12 INR = 3.1,  09/23/12 INR = 2, INR today = 3.3 Pt has a fluctuating INR on 5mg  daily. No problems regarding anticoagulation to report. Will continue Coumadin 5 mg daily. Recheck PT/INR in 1 week with existing lab/infusion appt; Lab at 12:15pm, treatment at 12:45pm and coumadin clinic at 1:00pm.   If INR remains elevated with next visit, may need to slightly adjust coumadin dose.

## 2012-09-30 NOTE — Patient Instructions (Signed)
[facility] Discharge Instructions for Patients Receiving Chemotherapy  Today you received the following chemotherapy agents: Velcade.   To help prevent nausea and vomiting after your treatment, we encourage you to take your nausea medication as needed.   If you develop nausea and vomiting that is not controlled by your nausea medication, call the clinic. If it is after clinic hours your family physician or the after hours number for the clinic or go to the Emergency Department.   BELOW ARE SYMPTOMS THAT SHOULD BE REPORTED IMMEDIATELY:  *FEVER GREATER THAN 100.5 F  *CHILLS WITH OR WITHOUT FEVER  NAUSEA AND VOMITING THAT IS NOT CONTROLLED WITH YOUR NAUSEA MEDICATION  *UNUSUAL SHORTNESS OF BREATH  *UNUSUAL BRUISING OR BLEEDING  TENDERNESS IN MOUTH AND THROAT WITH OR WITHOUT PRESENCE OF ULCERS  *URINARY PROBLEMS  *BOWEL PROBLEMS  UNUSUAL RASH Items with * indicate a potential emergency and should be followed up as soon as possible.  One of the nurses will contact you 24 hours after your treatment. Please let the nurse know about any problems that you may have experienced. Feel free to call the clinic you have any questions or concerns. The clinic phone number is [phone].   I have been informed and understand all the instructions given to me. I know to contact the clinic, my physician, or go to the Emergency Department if any problems should occur. I do not have any questions at this time, but understand that I may call the clinic during office hours   should I have any questions or need assistance in obtaining follow up care.

## 2012-09-30 NOTE — Patient Instructions (Addendum)
Continue Coumadin 5 mg daily. Recheck PT/INR in 1 week with existing lab/infusion appt; Lab at 12:15pm, treatment at 12:45pm and coumadin clinic at 1:00pm.

## 2012-10-01 ENCOUNTER — Other Ambulatory Visit: Payer: Self-pay | Admitting: Physician Assistant

## 2012-10-06 ENCOUNTER — Other Ambulatory Visit: Payer: Self-pay | Admitting: *Deleted

## 2012-10-06 DIAGNOSIS — C9000 Multiple myeloma not having achieved remission: Secondary | ICD-10-CM

## 2012-10-06 MED ORDER — OXYCODONE HCL 5 MG PO TABS: ORAL_TABLET | ORAL | Status: DC

## 2012-10-07 ENCOUNTER — Ambulatory Visit: Payer: Medicare Other | Admitting: Pharmacist

## 2012-10-07 ENCOUNTER — Ambulatory Visit (HOSPITAL_BASED_OUTPATIENT_CLINIC_OR_DEPARTMENT_OTHER): Payer: Medicare Other

## 2012-10-07 ENCOUNTER — Other Ambulatory Visit (HOSPITAL_BASED_OUTPATIENT_CLINIC_OR_DEPARTMENT_OTHER): Payer: Medicare Other | Admitting: Lab

## 2012-10-07 VITALS — BP 127/62 | HR 81 | Temp 98.7°F | Resp 18

## 2012-10-07 DIAGNOSIS — I82409 Acute embolism and thrombosis of unspecified deep veins of unspecified lower extremity: Secondary | ICD-10-CM

## 2012-10-07 DIAGNOSIS — C9000 Multiple myeloma not having achieved remission: Secondary | ICD-10-CM

## 2012-10-07 DIAGNOSIS — Z5112 Encounter for antineoplastic immunotherapy: Secondary | ICD-10-CM

## 2012-10-07 LAB — CBC WITH DIFFERENTIAL/PLATELET
Basophils Absolute: 0 10*3/uL (ref 0.0–0.1)
EOS%: 2.2 % (ref 0.0–7.0)
Eosinophils Absolute: 0.1 10*3/uL (ref 0.0–0.5)
HCT: 39.3 % (ref 34.8–46.6)
HGB: 12.9 g/dL (ref 11.6–15.9)
LYMPH%: 12.4 % — ABNORMAL LOW (ref 14.0–49.7)
MCH: 30.1 pg (ref 25.1–34.0)
MCV: 91.6 fL (ref 79.5–101.0)
MONO%: 8.5 % (ref 0.0–14.0)
NEUT#: 2.8 10*3/uL (ref 1.5–6.5)
NEUT%: 76.6 % (ref 38.4–76.8)
Platelets: 221 10*3/uL (ref 145–400)

## 2012-10-07 LAB — COMPREHENSIVE METABOLIC PANEL (CC13)
ALT: 14 U/L (ref 0–55)
AST: 15 U/L (ref 5–34)
Alkaline Phosphatase: 53 U/L (ref 40–150)
BUN: 34.1 mg/dL — ABNORMAL HIGH (ref 7.0–26.0)
Creatinine: 1.4 mg/dL — ABNORMAL HIGH (ref 0.6–1.1)
Total Bilirubin: 0.28 mg/dL (ref 0.20–1.20)

## 2012-10-07 LAB — PROTIME-INR: INR: 3.8 — ABNORMAL HIGH (ref 2.00–3.50)

## 2012-10-07 MED ORDER — BORTEZOMIB CHEMO SQ INJECTION 3.5 MG (2.5MG/ML)
1.3000 mg/m2 | Freq: Once | INTRAMUSCULAR | Status: AC
Start: 2012-10-07 — End: 2012-10-07
  Administered 2012-10-07: 2 mg via SUBCUTANEOUS
  Filled 2012-10-07: qty 2

## 2012-10-07 MED ORDER — ONDANSETRON HCL 8 MG PO TABS
8.0000 mg | ORAL_TABLET | Freq: Once | ORAL | Status: AC
  Administered 2012-10-07: 8 mg via ORAL

## 2012-10-07 NOTE — Progress Notes (Signed)
INR = 3.8 on 5 mg/day Pt reports no bleeding/bruising. No med changes. INR supratherapeutic again.  We will back down her dose to 5 mg/day; 2.5 mg Thurs/Sat. Recheck INR next week w/ tx. Marily Lente, Pharm.D.

## 2012-10-07 NOTE — Patient Instructions (Signed)
[facility] Discharge Instructions for Patients Receiving Chemotherapy  Today you received the following chemotherapy agents :  Velcade.  To help prevent nausea and vomiting after your treatment, we encourage you to take your nausea medication as instructed by your physician.    If you develop nausea and vomiting that is not controlled by your nausea medication, call the clinic. If it is after clinic hours your family physician or the after hours number for the clinic or go to the Emergency Department.   BELOW ARE SYMPTOMS THAT SHOULD BE REPORTED IMMEDIATELY:  *FEVER GREATER THAN 100.5 F  *CHILLS WITH OR WITHOUT FEVER  NAUSEA AND VOMITING THAT IS NOT CONTROLLED WITH YOUR NAUSEA MEDICATION  *UNUSUAL SHORTNESS OF BREATH  *UNUSUAL BRUISING OR BLEEDING  TENDERNESS IN MOUTH AND THROAT WITH OR WITHOUT PRESENCE OF ULCERS  *URINARY PROBLEMS  *BOWEL PROBLEMS  UNUSUAL RASH Items with * indicate a potential emergency and should be followed up as soon as possible.  One of the nurses will contact you 24 hours after your treatment. Please let the nurse know about any problems that you may have experienced. Feel free to call the clinic you have any questions or concerns. The clinic phone number is [phone].   I have been informed and understand all the instructions given to me. I know to contact the clinic, my physician, or go to the Emergency Department if any problems should occur. I do not have any questions at this time, but understand that I may call the clinic during office hours   should I have any questions or need assistance in obtaining follow up care.    __________________________________________  _____________  __________ Signature of Patient or Authorized Representative            Date                   Time    __________________________________________ Nurse's Signature    

## 2012-10-08 ENCOUNTER — Ambulatory Visit: Payer: Medicare Other

## 2012-10-08 LAB — IGG, IGA, IGM
IgA: 90 mg/dL (ref 69–380)
IgG (Immunoglobin G), Serum: 612 mg/dL — ABNORMAL LOW (ref 690–1700)
IgM, Serum: 113 mg/dL (ref 52–322)

## 2012-10-08 LAB — BETA 2 MICROGLOBULIN, SERUM: Beta-2 Microglobulin: 2.75 mg/L — ABNORMAL HIGH (ref 1.01–1.73)

## 2012-10-08 LAB — KAPPA/LAMBDA LIGHT CHAINS: Kappa free light chain: 0.82 mg/dL (ref 0.33–1.94)

## 2012-10-13 ENCOUNTER — Other Ambulatory Visit: Payer: Self-pay | Admitting: Medical Oncology

## 2012-10-13 DIAGNOSIS — C9000 Multiple myeloma not having achieved remission: Secondary | ICD-10-CM

## 2012-10-13 MED ORDER — OXYCODONE HCL 10 MG PO TB12: 10.0000 mg | ORAL_TABLET | Freq: Two times a day (BID) | ORAL | Status: DC

## 2012-10-13 NOTE — Telephone Encounter (Signed)
Refill requested and authorized.Locked in injection room for pickup.

## 2012-10-14 ENCOUNTER — Ambulatory Visit: Payer: Medicare Other | Admitting: Internal Medicine

## 2012-10-14 ENCOUNTER — Telehealth: Payer: Self-pay | Admitting: *Deleted

## 2012-10-14 ENCOUNTER — Ambulatory Visit: Payer: Medicare Other

## 2012-10-14 ENCOUNTER — Other Ambulatory Visit: Payer: Medicare Other | Admitting: Lab

## 2012-10-14 ENCOUNTER — Other Ambulatory Visit: Payer: Self-pay | Admitting: *Deleted

## 2012-10-14 NOTE — Telephone Encounter (Signed)
Pts daughter states that appointment needs to be rescheduled due to weather. Please call.

## 2012-10-15 ENCOUNTER — Telehealth: Payer: Self-pay | Admitting: *Deleted

## 2012-10-15 NOTE — Telephone Encounter (Signed)
Per staff message and POF I have scheduled appts.  JMW  

## 2012-10-18 ENCOUNTER — Other Ambulatory Visit: Payer: Self-pay | Admitting: *Deleted

## 2012-10-18 NOTE — Telephone Encounter (Signed)
Faxed refill request.  Request says Take one tablet by mouth every day.  Our meds list says take 0.5 tablet per day.  Please advise.

## 2012-10-19 MED ORDER — LISINOPRIL-HYDROCHLOROTHIAZIDE 20-12.5 MG PO TABS: 0.5000 | ORAL_TABLET | Freq: Every day | ORAL | Status: DC

## 2012-10-19 NOTE — Telephone Encounter (Signed)
Her dose was cut.  rx sent.

## 2012-10-21 ENCOUNTER — Telehealth: Payer: Self-pay | Admitting: Internal Medicine

## 2012-10-21 ENCOUNTER — Ambulatory Visit (HOSPITAL_BASED_OUTPATIENT_CLINIC_OR_DEPARTMENT_OTHER): Payer: Medicare Other | Admitting: Pharmacist

## 2012-10-21 ENCOUNTER — Ambulatory Visit (HOSPITAL_BASED_OUTPATIENT_CLINIC_OR_DEPARTMENT_OTHER): Payer: Medicare Other | Admitting: Internal Medicine

## 2012-10-21 ENCOUNTER — Telehealth: Payer: Self-pay | Admitting: *Deleted

## 2012-10-21 ENCOUNTER — Ambulatory Visit (HOSPITAL_BASED_OUTPATIENT_CLINIC_OR_DEPARTMENT_OTHER): Payer: Medicare Other

## 2012-10-21 ENCOUNTER — Other Ambulatory Visit: Payer: Medicare Other | Admitting: Lab

## 2012-10-21 VITALS — BP 142/74 | HR 67 | Temp 98.3°F | Resp 18 | Ht 60.0 in | Wt 131.9 lb

## 2012-10-21 DIAGNOSIS — C9000 Multiple myeloma not having achieved remission: Secondary | ICD-10-CM

## 2012-10-21 LAB — COMPREHENSIVE METABOLIC PANEL (CC13)
CO2: 29 mEq/L (ref 22–29)
Creatinine: 1 mg/dL (ref 0.6–1.1)
Glucose: 119 mg/dl — ABNORMAL HIGH (ref 70–99)
Total Bilirubin: 0.3 mg/dL (ref 0.20–1.20)

## 2012-10-21 LAB — CBC WITH DIFFERENTIAL/PLATELET
Eosinophils Absolute: 0.1 10*3/uL (ref 0.0–0.5)
HCT: 37.2 % (ref 34.8–46.6)
LYMPH%: 10.8 % — ABNORMAL LOW (ref 14.0–49.7)
MCHC: 33.5 g/dL (ref 31.5–36.0)
MCV: 90.9 fL (ref 79.5–101.0)
MONO#: 0.6 10*3/uL (ref 0.1–0.9)
MONO%: 14.3 % — ABNORMAL HIGH (ref 0.0–14.0)
NEUT#: 2.9 10*3/uL (ref 1.5–6.5)
NEUT%: 72.8 % (ref 38.4–76.8)
Platelets: 227 10*3/uL (ref 145–400)
WBC: 4 10*3/uL (ref 3.9–10.3)

## 2012-10-21 LAB — PROTIME-INR
INR: 2.7 (ref 2.00–3.50)
Protime: 32.4 Seconds — ABNORMAL HIGH (ref 10.6–13.4)

## 2012-10-21 MED ORDER — ONDANSETRON HCL 8 MG PO TABS
8.0000 mg | ORAL_TABLET | Freq: Once | ORAL | Status: AC
  Administered 2012-10-21: 8 mg via ORAL

## 2012-10-21 MED ORDER — BORTEZOMIB CHEMO SQ INJECTION 3.5 MG (2.5MG/ML)
1.3000 mg/m2 | Freq: Once | INTRAMUSCULAR | Status: AC
  Administered 2012-10-21: 2 mg via SUBCUTANEOUS
  Filled 2012-10-21: qty 2

## 2012-10-21 NOTE — Progress Notes (Signed)
INR within goal today. Pt seen in infusion area today. No problems to report regarding anticoagulation. Continue Coumadin 5 mg daily except 2.5 mg on Thurs&Sat. Recheck PT/INR in 3 weeks with existing lab/infusion appt 11/11/12.

## 2012-10-21 NOTE — Patient Instructions (Addendum)
There is slight progression of her disease on the recent bloodwork that you are currently asymptomatic. We'll continue with the same treatment regimen for now and repeat myeloma panel in 2 months.

## 2012-10-21 NOTE — Patient Instructions (Signed)
Continue Coumadin 5 mg daily except 2.5 mg on Thurs&Sat. Recheck PT/INR in 3 weeks with existing lab/infusion appt 11/11/12.

## 2012-10-21 NOTE — Telephone Encounter (Signed)
Per staff message and POF I have scheduled appts.  JMW  

## 2012-10-21 NOTE — Telephone Encounter (Signed)
pt going to pick up schedule from [place]...emaield michelle to add tx

## 2012-10-21 NOTE — Progress Notes (Signed)
[facility] [phone]   [phone]  OFFICE PROGRESS NOTE  [name], MD [address] [place] [address]  DIAGNOSIS:  1. multiple myeloma diagnosed in September 2009,  2. history of bilateral lower extremity deep vein thrombosis diagnosed in November 2009  3. acute on chronic deep vein thrombosis in the left lower extremity diagnosed in October 2010.   PRIOR THERAPY:  1. status post palliative radiotherapy to the right femur and is she'll tuberosity, first was done in December 2011 and the second treatment was completed Jan 08 2011, and the third treatment to the right distal femur completed on 04/30/2011  2. status post palliative radiotherapy to the left proximal femur completed 02/28/2011.  3. Revlimid 25 mg by mouth daily for 21 days every 4 weeks in addition to oral Decadron at 40 mg by mouth on a weekly basis status post 37 cycles.   CURRENT THERAPY:  1. weekly subcutaneous Velcade 1.3 mg/M2 status post 56 cycles.  2. Coumadin 5 mg on Mondays and Thursdays and 2.5 mg on all other days.  3. Zometa 4 mg IV given every 2 months.  INTERVAL HISTORY: [name] 77 y.o. female returns to the clinic today for followup visit accompanied her daughter. The patient is feeling fine today with no specific complaints except for pain in the lower extremities secondary to her recent herpes zoster infection. She is currently on treatment with acyclovir for the last few weeks. The patient is also currently on treatment with gabapentin in addition to her pain medications with OxyContin and OxyIR. She is tolerating her treatment with Velcade fairly well with no significant adverse effects. She denied having any significant fatigue or weakness. She denied having any weight loss or night sweats. She denied having any chest pain, shortness breath, cough or hemoptysis. The patient had repeat myeloma panel performed recently and she is here today for  evaluation and discussion of her lab results.  MEDICAL HISTORY: Past Medical History  Diagnosis Date  . Blood transfusion   . Depression     Mild  . History of chicken pox   . Hyperlipidemia   . Hypertension   . Degenerative joint disease   . Diabetes mellitus     Steroid related  . Phlebitis   . Multiple myeloma(203.0)     per Dr. [name], s/p palliative radiation for leg pain 2011 per Dr. Roselind Messier  . Deep vein thrombosis of bilateral lower extremities     ALLERGIES:  has No Known Allergies.  MEDICATIONS:  Current Outpatient Prescriptions  Medication Sig Dispense Refill  . acyclovir (ZOVIRAX) 400 MG tablet Take 1 tablet (400 mg total) by mouth 2 (two) times daily.  60 tablet  2  . ALPRAZolam (XANAX) 0.5 MG tablet TAKE ONE TABLET BY MOUTH THREE TIMES DAILY, OR AS NEEDED FOR  SLEEP  60 tablet  1  . Calcium Carbonate-Vitamin D 600-400 MG-UNIT per tablet Take 1 tablet by mouth 3 (three) times daily with meals.        Marland Kitchen dexamethasone (DECADRON) 4 MG tablet Take 10 tablets by mouth once a week  40 tablet  3  . gabapentin (NEURONTIN) 100 MG capsule 100 mg. Takes 3  ( 300 mg) by mouth TID      . glipiZIDE (GLUCOTROL XL) 2.5 MG 24 hr tablet Take 1 tablet (2.5 mg total) by mouth daily.  90 tablet  2  . glucose blood (ONE TOUCH ULTRA TEST) test strip  Use as instructed  25 each  6  . ibuprofen (ADVIL,MOTRIN) 200 MG tablet Take 200 mg by mouth every 6 (six) hours as needed.        Marland Kitchen KLOR-CON M20 20 MEQ tablet TAKE ONE TABLET BY MOUTH EVERY DAY  30 each  1  . Lancets (ONETOUCH ULTRASOFT) lancets Test blood sugar once daily or as directed.  100 each  11  . lidocaine (LIDODERM) 5 % Place 1 patch onto the skin daily. Remove & Discard patch within 12 hours or as directed by MD  30 patch  0  . lisinopril-hydrochlorothiazide (PRINZIDE,ZESTORETIC) 20-12.5 MG per tablet Take 0.5 tablets by mouth daily.  45 tablet  3  . magnesium oxide (MAG-OX) 400 MG tablet Take 400 mg by mouth daily.       Marland Kitchen  oxyCODONE (OXY IR/ROXICODONE) 5 MG immediate release tablet Take 1 1/2 tabs by mouth every 6 hours as needed for pain  90 tablet  0  . oxyCODONE (OXYCONTIN) 10 MG 12 hr tablet Take 1 tablet (10 mg total) by mouth every 12 (twelve) hours.  60 tablet  0  . potassium chloride SA (K-DUR,KLOR-CON) 20 MEQ tablet Take 1 tablet (20 mEq total) by mouth daily.  7 tablet  0  . prochlorperazine (COMPAZINE) 10 MG tablet Take 1 tablet (10 mg total) by mouth every 6 (six) hours as needed.  30 tablet  1  . temazepam (RESTORIL) 30 MG capsule TAKE ONE CAPSULE BY MOUTH AT BEDTIME  30 capsule  0  . warfarin (COUMADIN) 5 MG tablet TAKE ONE TABLET BY MOUTH ONCE DAILY ON TUESDAY, THURSDAY AND SATURDAY. TAKE ONE-HALF TABLET BY MOUTH ON ALL OTHER DAYS OR AS DIRECTED  60 tablet  0   No current facility-administered medications for this visit.   Facility-Administered Medications Ordered in Other Visits  Medication Dose Route Frequency Provider Last Rate Last Dose  . Zoledronic Acid (ZOMETA) 4 mg IVPB  4 mg Intravenous Once Si Gaul, MD        SURGICAL HISTORY:  Past Surgical History  Procedure Laterality Date  . Abdominal hysterectomy      REVIEW OF SYSTEMS:  A comprehensive review of systems was negative except for: Neurological: positive for paresthesia   PHYSICAL EXAMINATION: General appearance: alert, cooperative and no distress Head: Normocephalic, without obvious abnormality, atraumatic Neck: no adenopathy Lymph nodes: Cervical, supraclavicular, and axillary nodes normal. Resp: clear to auscultation bilaterally Cardio: regular rate and rhythm, S1, S2 normal, no murmur, click, rub or gallop GI: soft, non-tender; bowel sounds normal; no masses,  no organomegaly Extremities: extremities normal, atraumatic, no cyanosis or edema Neurologic: Alert and oriented X 3, normal strength and tone. Normal symmetric reflexes. Normal coordination and gait  ECOG PERFORMANCE STATUS: 1 - Symptomatic but completely  ambulatory  Blood pressure 142/74, pulse 67, temperature 98.3 F (36.8 C), temperature source Oral, resp. rate 18, height 5' (1.524 m), weight 131 lb 14.4 oz (59.829 kg).  LABORATORY DATA: Lab Results  Component Value Date   WBC 4.0 10/21/2012   HGB 12.5 10/21/2012   HCT 37.2 10/21/2012   MCV 90.9 10/21/2012   PLT 227 10/21/2012      Chemistry      Component Value Date/Time   NA 139 10/07/2012 1228   NA 141 06/24/2012 1620   K 3.8 10/07/2012 1228   K 3.1* 06/24/2012 1620   CL 102 10/07/2012 1228   CL 101 06/24/2012 1620   CO2 27 10/07/2012 1228   CO2 32 06/24/2012  1620   BUN 34.1* 10/07/2012 1228   BUN 15 06/24/2012 1620   CREATININE 1.4* 10/07/2012 1228   CREATININE 0.96 06/24/2012 1620      Component Value Date/Time   CALCIUM 9.3 10/07/2012 1228   CALCIUM 9.4 06/24/2012 1620   CALCIUM  Value: 5.7 CRITICAL RESULT CALLED TO, READ BACK BY AND VERIFIED WITH: JAMIE TRACY,RN [id number] @ 1433 BY J SCOTTON (NOTE)  Amended report. Result repeated and verified. CORRECTED ON 10/14 AT 1429: PREVIOUSLY REPORTED AS Result repeated and verified.* 06/13/2008 1605   ALKPHOS 53 10/07/2012 1228   ALKPHOS 50 06/24/2012 1620   AST 15 10/07/2012 1228   AST 19 06/24/2012 1620   ALT 14 10/07/2012 1228   ALT 15 06/24/2012 1620   BILITOT 0.28 10/07/2012 1228   BILITOT 0.2* 06/24/2012 1620       RADIOGRAPHIC STUDIES: No results found.  ASSESSMENT: this is a very pleasant 77 years old [place] American female with history of multiple myeloma currently on treatment with weekly subcutaneous Velcade and tolerating it fairly well. Her myeloma panel performed recently showed some evidence for increase in the free lambda light chain concerning for mild disease progression.  PLAN: I discussed the lab result with the patient and her daughter today. I recommended for her to continue treatment with Velcade for now. I would monitor her closely with repeat myeloma panel in 2 months. If she continues to have evidence for disease  progression I would consider switching her to a different chemotherapy regimen. followup visit in one month. For the herpes zoster, the patient will continue on acyclovir. For the history of bilateral lower extremity deep venous thrombosis the patient will continue on her Coumadin followed by the Coumadin clinic. For pain management the patient will continue on OxyContin, OxyIR and Neurontin. She was advised to call me immediately if she has any concerning symptoms in the interval.  All questions were answered. The patient knows to call the clinic with any problems, questions or concerns. We can certainly see the patient much sooner if necessary.

## 2012-10-22 ENCOUNTER — Encounter: Payer: Self-pay | Admitting: Internal Medicine

## 2012-10-28 ENCOUNTER — Other Ambulatory Visit (HOSPITAL_BASED_OUTPATIENT_CLINIC_OR_DEPARTMENT_OTHER): Payer: Medicare Other | Admitting: Lab

## 2012-10-28 ENCOUNTER — Ambulatory Visit (HOSPITAL_BASED_OUTPATIENT_CLINIC_OR_DEPARTMENT_OTHER): Payer: Medicare Other

## 2012-10-28 VITALS — BP 160/79 | HR 66 | Temp 98.7°F | Resp 17

## 2012-10-28 DIAGNOSIS — C9000 Multiple myeloma not having achieved remission: Secondary | ICD-10-CM

## 2012-10-28 DIAGNOSIS — Z5112 Encounter for antineoplastic immunotherapy: Secondary | ICD-10-CM

## 2012-10-28 LAB — CBC WITH DIFFERENTIAL/PLATELET
BASO%: 0.4 % (ref 0.0–2.0)
Basophils Absolute: 0 10*3/uL (ref 0.0–0.1)
HCT: 37.3 % (ref 34.8–46.6)
HGB: 12.4 g/dL (ref 11.6–15.9)
LYMPH%: 13.6 % — ABNORMAL LOW (ref 14.0–49.7)
MCH: 30.3 pg (ref 25.1–34.0)
MCHC: 33.2 g/dL (ref 31.5–36.0)
MONO#: 0.4 10*3/uL (ref 0.1–0.9)
NEUT%: 72.4 % (ref 38.4–76.8)
Platelets: 194 10*3/uL (ref 145–400)
WBC: 3.4 10*3/uL — ABNORMAL LOW (ref 3.9–10.3)
lymph#: 0.5 10*3/uL — ABNORMAL LOW (ref 0.9–3.3)

## 2012-10-28 LAB — COMPREHENSIVE METABOLIC PANEL (CC13)
ALT: 22 U/L (ref 0–55)
BUN: 15.2 mg/dL (ref 7.0–26.0)
CO2: 29 mEq/L (ref 22–29)
Calcium: 9.6 mg/dL (ref 8.4–10.4)
Chloride: 105 mEq/L (ref 98–107)
Creatinine: 1 mg/dL (ref 0.6–1.1)
Total Bilirubin: 0.3 mg/dL (ref 0.20–1.20)

## 2012-10-28 MED ORDER — BORTEZOMIB CHEMO SQ INJECTION 3.5 MG (2.5MG/ML)
1.3000 mg/m2 | Freq: Once | INTRAMUSCULAR | Status: AC
  Administered 2012-10-28: 2 mg via SUBCUTANEOUS
  Filled 2012-10-28: qty 2

## 2012-10-28 MED ORDER — ONDANSETRON HCL 8 MG PO TABS
8.0000 mg | ORAL_TABLET | Freq: Once | ORAL | Status: AC
  Administered 2012-10-28: 8 mg via ORAL

## 2012-10-28 NOTE — Patient Instructions (Addendum)
[facility] Discharge Instructions for Patients Receiving Chemotherapy  Today you received the following chemotherapy agents:  Velcade  To help prevent nausea and vomiting after your treatment, we encourage you to take your nausea medication as ordered per MD.    If you develop nausea and vomiting that is not controlled by your nausea medication, call the clinic. If it is after clinic hours your family physician or the after hours number for the clinic or go to the Emergency Department.   BELOW ARE SYMPTOMS THAT SHOULD BE REPORTED IMMEDIATELY:  *FEVER GREATER THAN 100.5 F  *CHILLS WITH OR WITHOUT FEVER  NAUSEA AND VOMITING THAT IS NOT CONTROLLED WITH YOUR NAUSEA MEDICATION  *UNUSUAL SHORTNESS OF BREATH  *UNUSUAL BRUISING OR BLEEDING  TENDERNESS IN MOUTH AND THROAT WITH OR WITHOUT PRESENCE OF ULCERS  *URINARY PROBLEMS  *BOWEL PROBLEMS  UNUSUAL RASH Items with * indicate a potential emergency and should be followed up as soon as possible.   Please let the nurse know about any problems that you may have experienced. Feel free to call the clinic you have any questions or concerns. The clinic phone number is [phone].   I have been informed and understand all the instructions given to me. I know to contact the clinic, my physician, or go to the Emergency Department if any problems should occur. I do not have any questions at this time, but understand that I may call the clinic during office hours   should I have any questions or need assistance in obtaining follow up care.    __________________________________________  _____________  __________ Signature of Patient or Authorized Representative            Date                   Time    __________________________________________ Nurse's Signature    

## 2012-10-30 ENCOUNTER — Other Ambulatory Visit: Payer: Self-pay | Admitting: Internal Medicine

## 2012-10-30 ENCOUNTER — Other Ambulatory Visit: Payer: Self-pay | Admitting: Physician Assistant

## 2012-10-30 DIAGNOSIS — C9000 Multiple myeloma not having achieved remission: Secondary | ICD-10-CM

## 2012-11-04 ENCOUNTER — Other Ambulatory Visit (HOSPITAL_BASED_OUTPATIENT_CLINIC_OR_DEPARTMENT_OTHER): Payer: Medicare Other | Admitting: Lab

## 2012-11-04 ENCOUNTER — Ambulatory Visit (HOSPITAL_BASED_OUTPATIENT_CLINIC_OR_DEPARTMENT_OTHER): Payer: Medicare Other

## 2012-11-04 VITALS — BP 163/82 | HR 63 | Temp 98.6°F

## 2012-11-04 DIAGNOSIS — C9000 Multiple myeloma not having achieved remission: Secondary | ICD-10-CM

## 2012-11-04 LAB — CBC WITH DIFFERENTIAL/PLATELET
Basophils Absolute: 0 10*3/uL (ref 0.0–0.1)
HCT: 36.6 % (ref 34.8–46.6)
HGB: 12.3 g/dL (ref 11.6–15.9)
MCH: 30.9 pg (ref 25.1–34.0)
MONO#: 0.5 10*3/uL (ref 0.1–0.9)
NEUT%: 73.2 % (ref 38.4–76.8)
WBC: 4.1 10*3/uL (ref 3.9–10.3)
lymph#: 0.5 10*3/uL — ABNORMAL LOW (ref 0.9–3.3)

## 2012-11-04 LAB — COMPREHENSIVE METABOLIC PANEL (CC13)
BUN: 14.5 mg/dL (ref 7.0–26.0)
CO2: 28 mEq/L (ref 22–29)
Calcium: 9.2 mg/dL (ref 8.4–10.4)
Chloride: 105 mEq/L (ref 98–107)
Creatinine: 0.9 mg/dL (ref 0.6–1.1)

## 2012-11-04 MED ORDER — BORTEZOMIB CHEMO SQ INJECTION 3.5 MG (2.5MG/ML)
1.3000 mg/m2 | Freq: Once | INTRAMUSCULAR | Status: AC
  Administered 2012-11-04: 2 mg via SUBCUTANEOUS
  Filled 2012-11-04: qty 2

## 2012-11-04 MED ORDER — ONDANSETRON HCL 8 MG PO TABS
8.0000 mg | ORAL_TABLET | Freq: Once | ORAL | Status: AC
  Administered 2012-11-04: 8 mg via ORAL

## 2012-11-04 NOTE — Patient Instructions (Signed)
[facility] Discharge Instructions for Patients Receiving Chemotherapy  Today you received the following chemotherapy agents: Velcade.  To help prevent nausea and vomiting after your treatment, we encourage you to take your nausea medication, Compazine. Take it every six hours as needed for nausea. May make you drowsy.   If you develop nausea and vomiting that is not controlled by your nausea medication, call the clinic. If it is after clinic hours your family physician or the after hours number for the clinic or go to the Emergency Department.   BELOW ARE SYMPTOMS THAT SHOULD BE REPORTED IMMEDIATELY:  *FEVER GREATER THAN 100.5 F  *CHILLS WITH OR WITHOUT FEVER  NAUSEA AND VOMITING THAT IS NOT CONTROLLED WITH YOUR NAUSEA MEDICATION  *UNUSUAL SHORTNESS OF BREATH  *UNUSUAL BRUISING OR BLEEDING  TENDERNESS IN MOUTH AND THROAT WITH OR WITHOUT PRESENCE OF ULCERS  *URINARY PROBLEMS  *BOWEL PROBLEMS  UNUSUAL RASH Items with * indicate a potential emergency and should be followed up as soon as possible.  Feel free to call the clinic you have any questions or concerns. The clinic phone number is [phone].   I have been informed and understand all the instructions given to me. I know to contact the clinic, my physician, or go to the Emergency Department if any problems should occur. I do not have any questions at this time, but understand that I may call the clinic during office hours   should I have any questions or need assistance in obtaining follow up care.

## 2012-11-09 ENCOUNTER — Other Ambulatory Visit: Payer: Self-pay | Admitting: Medical Oncology

## 2012-11-09 MED ORDER — OXYCODONE HCL 10 MG PO TB12: 10.0000 mg | ORAL_TABLET | Freq: Two times a day (BID) | ORAL | Status: DC

## 2012-11-09 MED ORDER — OXYCODONE HCL 5 MG PO TABS: ORAL_TABLET | ORAL | Status: DC

## 2012-11-09 NOTE — Telephone Encounter (Signed)
Pt needs refill on long and short acting morphine,. RX authorized and placed in injection room locked up.-Daughter notified to pickup.

## 2012-11-11 ENCOUNTER — Other Ambulatory Visit: Payer: Self-pay

## 2012-11-11 ENCOUNTER — Ambulatory Visit: Payer: Medicare Other | Admitting: Pharmacist

## 2012-11-11 ENCOUNTER — Ambulatory Visit (HOSPITAL_BASED_OUTPATIENT_CLINIC_OR_DEPARTMENT_OTHER): Payer: Medicare Other

## 2012-11-11 ENCOUNTER — Other Ambulatory Visit (HOSPITAL_BASED_OUTPATIENT_CLINIC_OR_DEPARTMENT_OTHER): Payer: Medicare Other | Admitting: Lab

## 2012-11-11 VITALS — BP 188/83 | HR 67 | Temp 97.3°F

## 2012-11-11 DIAGNOSIS — I82409 Acute embolism and thrombosis of unspecified deep veins of unspecified lower extremity: Secondary | ICD-10-CM

## 2012-11-11 DIAGNOSIS — C9000 Multiple myeloma not having achieved remission: Secondary | ICD-10-CM

## 2012-11-11 LAB — CBC WITH DIFFERENTIAL/PLATELET
BASO%: 0.2 % (ref 0.0–2.0)
Basophils Absolute: 0 10*3/uL (ref 0.0–0.1)
HCT: 37.2 % (ref 34.8–46.6)
HGB: 12 g/dL (ref 11.6–15.9)
MONO#: 0.5 10*3/uL (ref 0.1–0.9)
NEUT%: 76 % (ref 38.4–76.8)
WBC: 4.3 10*3/uL (ref 3.9–10.3)
lymph#: 0.5 10*3/uL — ABNORMAL LOW (ref 0.9–3.3)

## 2012-11-11 LAB — COMPREHENSIVE METABOLIC PANEL (CC13)
AST: 19 U/L (ref 5–34)
Albumin: 3.1 g/dL — ABNORMAL LOW (ref 3.5–5.0)
BUN: 9.7 mg/dL (ref 7.0–26.0)
Calcium: 9.1 mg/dL (ref 8.4–10.4)
Chloride: 106 mEq/L (ref 98–107)
Creatinine: 0.8 mg/dL (ref 0.6–1.1)
Glucose: 119 mg/dl — ABNORMAL HIGH (ref 70–99)
Potassium: 3.5 mEq/L (ref 3.5–5.1)

## 2012-11-11 LAB — POCT INR: INR: 2.6

## 2012-11-11 LAB — PROTIME-INR: INR: 2.6 (ref 2.00–3.50)

## 2012-11-11 MED ORDER — BORTEZOMIB CHEMO SQ INJECTION 3.5 MG (2.5MG/ML)
1.3000 mg/m2 | Freq: Once | INTRAMUSCULAR | Status: AC
  Administered 2012-11-11: 2 mg via SUBCUTANEOUS
  Filled 2012-11-11: qty 2

## 2012-11-11 MED ORDER — GLIPIZIDE ER 2.5 MG PO TB24: 2.5000 mg | ORAL_TABLET | Freq: Every day | ORAL | Status: DC

## 2012-11-11 MED ORDER — ONDANSETRON HCL 8 MG PO TABS
8.0000 mg | ORAL_TABLET | Freq: Once | ORAL | Status: AC
  Administered 2012-11-11: 8 mg via ORAL

## 2012-11-11 NOTE — Progress Notes (Signed)
INR continues to be at goal of 2-3.  Will continue 5mg  daily and 2.5mg  Th/Sat.  Check PT/INR in 3 weeks.

## 2012-11-11 NOTE — Telephone Encounter (Signed)
[name] pts daughter request refill glipizide to [place] on [address]. Medication phoned to [facility] as instructed. [name] notified done and pt will keep 12/08/12 appt.

## 2012-11-11 NOTE — Patient Instructions (Signed)
[facility] Discharge Instructions for Patients Receiving Chemotherapy  Today you received the following chemotherapy agents: Velcade. To help prevent nausea and vomiting after your treatment, we encourage you to take your nausea medication.  If you develop nausea and vomiting that is not controlled by your nausea medication, call the clinic.   BELOW ARE SYMPTOMS THAT SHOULD BE REPORTED IMMEDIATELY:  *FEVER GREATER THAN 100.5 F  *CHILLS WITH OR WITHOUT FEVER  NAUSEA AND VOMITING THAT IS NOT CONTROLLED WITH YOUR NAUSEA MEDICATION  *UNUSUAL SHORTNESS OF BREATH  *UNUSUAL BRUISING OR BLEEDING  TENDERNESS IN MOUTH AND THROAT WITH OR WITHOUT PRESENCE OF ULCERS  *URINARY PROBLEMS  *BOWEL PROBLEMS  UNUSUAL RASH Items with * indicate a potential emergency and should be followed up as soon as possible.  Feel free to call the clinic you have any questions or concerns. The clinic phone number is [phone].    

## 2012-11-18 ENCOUNTER — Other Ambulatory Visit (HOSPITAL_BASED_OUTPATIENT_CLINIC_OR_DEPARTMENT_OTHER): Payer: Medicare Other | Admitting: Lab

## 2012-11-18 ENCOUNTER — Ambulatory Visit (HOSPITAL_BASED_OUTPATIENT_CLINIC_OR_DEPARTMENT_OTHER): Payer: Medicare Other

## 2012-11-18 ENCOUNTER — Encounter: Payer: Self-pay | Admitting: Physician Assistant

## 2012-11-18 ENCOUNTER — Telehealth: Payer: Self-pay | Admitting: Internal Medicine

## 2012-11-18 ENCOUNTER — Ambulatory Visit (HOSPITAL_BASED_OUTPATIENT_CLINIC_OR_DEPARTMENT_OTHER): Payer: Medicare Other | Admitting: Physician Assistant

## 2012-11-18 VITALS — BP 161/79 | HR 77 | Temp 97.4°F | Resp 18 | Ht 60.0 in | Wt 133.0 lb

## 2012-11-18 DIAGNOSIS — C9000 Multiple myeloma not having achieved remission: Secondary | ICD-10-CM

## 2012-11-18 LAB — CBC WITH DIFFERENTIAL/PLATELET
Basophils Absolute: 0 10*3/uL (ref 0.0–0.1)
Eosinophils Absolute: 0.1 10*3/uL (ref 0.0–0.5)
HGB: 12.3 g/dL (ref 11.6–15.9)
MCV: 93.4 fL (ref 79.5–101.0)
MONO#: 0.5 10*3/uL (ref 0.1–0.9)
MONO%: 14 % (ref 0.0–14.0)
NEUT#: 2.4 10*3/uL (ref 1.5–6.5)
RBC: 4.12 10*6/uL (ref 3.70–5.45)
RDW: 14.5 % (ref 11.2–14.5)
WBC: 3.6 10*3/uL — ABNORMAL LOW (ref 3.9–10.3)
lymph#: 0.6 10*3/uL — ABNORMAL LOW (ref 0.9–3.3)

## 2012-11-18 LAB — COMPREHENSIVE METABOLIC PANEL (CC13)
Albumin: 3 g/dL — ABNORMAL LOW (ref 3.5–5.0)
Alkaline Phosphatase: 57 U/L (ref 40–150)
BUN: 12.4 mg/dL (ref 7.0–26.0)
Calcium: 9.4 mg/dL (ref 8.4–10.4)
Chloride: 102 mEq/L (ref 98–107)
Glucose: 162 mg/dl — ABNORMAL HIGH (ref 70–99)
Potassium: 3.5 mEq/L (ref 3.5–5.1)
Sodium: 142 mEq/L (ref 136–145)
Total Protein: 6.3 g/dL — ABNORMAL LOW (ref 6.4–8.3)

## 2012-11-18 MED ORDER — BORTEZOMIB CHEMO SQ INJECTION 3.5 MG (2.5MG/ML)
1.3000 mg/m2 | Freq: Once | INTRAMUSCULAR | Status: AC
Start: 2012-11-18 — End: 2012-11-18
  Administered 2012-11-18: 2 mg via SUBCUTANEOUS
  Filled 2012-11-18: qty 2

## 2012-11-18 MED ORDER — ONDANSETRON HCL 8 MG PO TABS
8.0000 mg | ORAL_TABLET | Freq: Once | ORAL | Status: AC
  Administered 2012-11-18: 8 mg via ORAL

## 2012-11-18 NOTE — Patient Instructions (Addendum)
[facility] Discharge Instructions for Patients Receiving Chemotherapy  Today you received the following chemotherapy agents: velcade  To help prevent nausea and vomiting after your treatment, we encourage you to take your nausea medication.  Take it as often as prescribed.     If you develop nausea and vomiting that is not controlled by your nausea medication, call the clinic. If it is after clinic hours your family physician or the after hours number for the clinic or go to the Emergency Department.   BELOW ARE SYMPTOMS THAT SHOULD BE REPORTED IMMEDIATELY:  *FEVER GREATER THAN 100.5 F  *CHILLS WITH OR WITHOUT FEVER  NAUSEA AND VOMITING THAT IS NOT CONTROLLED WITH YOUR NAUSEA MEDICATION  *UNUSUAL SHORTNESS OF BREATH  *UNUSUAL BRUISING OR BLEEDING  TENDERNESS IN MOUTH AND THROAT WITH OR WITHOUT PRESENCE OF ULCERS  *URINARY PROBLEMS  *BOWEL PROBLEMS  UNUSUAL RASH Items with * indicate a potential emergency and should be followed up as soon as possible.  Feel free to call the clinic you have any questions or concerns. The clinic phone number is [phone].   I have been informed and understand all the instructions given to me. I know to contact the clinic, my physician, or go to the Emergency Department if any problems should occur. I do not have any questions at this time, but understand that I may call the clinic during office hours   should I have any questions or need assistance in obtaining follow up care.    __________________________________________  _____________  __________ Signature of Patient or Authorized Representative            Date                   Time    __________________________________________ Nurse's Signature    

## 2012-11-18 NOTE — Telephone Encounter (Signed)
Pt's daughter will get appt calendar from [facility] at  the chemo room for [facility] 2th and April 2014 lab, chemo and ML

## 2012-11-18 NOTE — Patient Instructions (Addendum)
Continue with labs and chemotherapy as scheduled Followup in 3 weeks for another symptom management visit

## 2012-11-18 NOTE — Progress Notes (Signed)
[facility] Telephone:([phone]   [phone]) [phone]  OFFICE PROGRESS NOTE  [name], MD [address] [place] [address]  DIAGNOSIS:  1. multiple myeloma diagnosed in September 2009,  2. history of bilateral lower extremity deep vein thrombosis diagnosed in November 2009  3. acute on chronic deep vein thrombosis in the left lower extremity diagnosed in October 2010.   PRIOR THERAPY:  1. status post palliative radiotherapy to the right femur and is she'll tuberosity, first was done in December 2011 and the second treatment was completed Jan 08 2011, and the third treatment to the right distal femur completed on 04/30/2011  2. status post palliative radiotherapy to the left proximal femur completed 02/28/2011.  3. Revlimid 25 mg by mouth daily for 21 days every 4 weeks in addition to oral Decadron at 40 mg by mouth on a weekly basis status post 40 cycles.   CURRENT THERAPY:  1. weekly subcutaneous Velcade 1.3 mg/M2 status post 56 cycles.  2. Coumadin 5 mg on Mondays and Thursdays and 2.5 mg on all other days.  3. Zometa 4 mg IV given every 2 months.  INTERVAL HISTORY: [name] 77 y.o. female returns to the clinic today for followup visit accompanied by her daughters. Today she complains of bilateral leg pain. This is a long-standing complaint that was exacerbated by a bout with herpes zoster. Her medications remain the same including gabapentin, OxyContin and OxyIR. The patient is also currently on treatment with gabapentin in addition to her pain medications with OxyContin and OxyIR. She is tolerating her treatment with Velcade fairly well with no significant adverse effects. She denied having any significant fatigue or weakness. She denied having any weight loss or night sweats. She denied having any chest pain, shortness breath, cough or hemoptysis.   MEDICAL HISTORY: Past Medical History  Diagnosis Date  . Blood transfusion   . Depression      Mild  . History of chicken pox   . Hyperlipidemia   . Hypertension   . Degenerative joint disease   . Diabetes mellitus     Steroid related  . Phlebitis   . Multiple myeloma(203.0)     per Dr. [name], s/p palliative radiation for leg pain 2011 per Dr. Roselind Messier  . Deep vein thrombosis of bilateral lower extremities     ALLERGIES:  has No Known Allergies.  MEDICATIONS:  Current Outpatient Prescriptions  Medication Sig Dispense Refill  . acyclovir (ZOVIRAX) 400 MG tablet TAKE ONE TABLET BY MOUTH TWICE DAILY  60 tablet  0  . ALPRAZolam (XANAX) 0.5 MG tablet TAKE ONE TABLET BY MOUTH THREE TIMES DAILY, OR AS NEEDED FOR  SLEEP  60 tablet  1  . Calcium Carbonate-Vitamin D 600-400 MG-UNIT per tablet Take 1 tablet by mouth 3 (three) times daily with meals.        Marland Kitchen dexamethasone (DECADRON) 4 MG tablet TAKE TEN TABLETS BY MOUTH ONCE A WEEK  40 tablet  1  . gabapentin (NEURONTIN) 100 MG capsule 100 mg. Takes 3  ( 300 mg) by mouth TID      . glipiZIDE (GLUCOTROL XL) 2.5 MG 24 hr tablet Take 1 tablet (2.5 mg total) by mouth daily.  90 tablet  0  . glucose blood (ONE TOUCH ULTRA TEST) test strip Use as instructed  25 each  6  . ibuprofen (ADVIL,MOTRIN) 200 MG tablet Take 200 mg by mouth every 6 (six) hours as needed.        Marland Kitchen  KLOR-CON M20 20 MEQ tablet TAKE ONE TABLET BY MOUTH EVERY DAY  30 each  1  . Lancets (ONETOUCH ULTRASOFT) lancets Test blood sugar once daily or as directed.  100 each  11  . lidocaine (LIDODERM) 5 % Place 1 patch onto the skin daily. Remove & Discard patch within 12 hours or as directed by MD  30 patch  0  . lisinopril-hydrochlorothiazide (PRINZIDE,ZESTORETIC) 20-12.5 MG per tablet Take 0.5 tablets by mouth daily.  45 tablet  3  . magnesium oxide (MAG-OX) 400 MG tablet Take 400 mg by mouth daily.       Marland Kitchen oxyCODONE (OXY IR/ROXICODONE) 5 MG immediate release tablet Take 1 1/2 tabs by mouth every 6 hours as needed for pain  90 tablet  0  . oxyCODONE (OXYCONTIN) 10 MG 12 hr  tablet Take 1 tablet (10 mg total) by mouth every 12 (twelve) hours.  60 tablet  0  . potassium chloride SA (K-DUR,KLOR-CON) 20 MEQ tablet Take 1 tablet (20 mEq total) by mouth daily.  7 tablet  0  . prochlorperazine (COMPAZINE) 10 MG tablet Take 1 tablet (10 mg total) by mouth every 6 (six) hours as needed.  30 tablet  1  . temazepam (RESTORIL) 30 MG capsule TAKE ONE CAPSULE BY MOUTH AT BEDTIME  30 capsule  0  . warfarin (COUMADIN) 5 MG tablet TAKE ONE TABLET BY MOUTH ONCE DAILY ON TUESDAY, THURSDAY AND SATURDAY. TAKE ONE-HALF TABLET BY MOUTH ON ALL OTHER DAYS OR AS DIRECTED  60 tablet  0   No current facility-administered medications for this visit.   Facility-Administered Medications Ordered in Other Visits  Medication Dose Route Frequency Provider Last Rate Last Dose  . Zoledronic Acid (ZOMETA) 4 mg IVPB  4 mg Intravenous Once Si Gaul, MD        SURGICAL HISTORY:  Past Surgical History  Procedure Laterality Date  . Abdominal hysterectomy      REVIEW OF SYSTEMS:  A comprehensive review of systems was negative except for: Neurological: positive for paresthesia   PHYSICAL EXAMINATION: General appearance: alert, cooperative and no distress Head: Normocephalic, without obvious abnormality, atraumatic Neck: no adenopathy Lymph nodes: Cervical, supraclavicular, and axillary nodes normal. Resp: clear to auscultation bilaterally Cardio: regular rate and rhythm, S1, S2 normal, no murmur, click, rub or gallop GI: soft, non-tender; bowel sounds normal; no masses,  no organomegaly Extremities: extremities normal, atraumatic, no cyanosis or edema Neurologic: Alert and oriented X 3, normal strength and tone. Normal symmetric reflexes. Normal coordination and gait  ECOG PERFORMANCE STATUS: 1 - Symptomatic but completely ambulatory  Blood pressure 161/79, pulse 77, temperature 97.4 F (36.3 C), temperature source Oral, resp. rate 18, height 5' (1.524 m), weight 133 lb (60.328  kg).  LABORATORY DATA: Lab Results  Component Value Date   WBC 3.6* 11/18/2012   HGB 12.3 11/18/2012   HCT 38.5 11/18/2012   MCV 93.4 11/18/2012   PLT 218 11/18/2012      Chemistry      Component Value Date/Time   NA 143 11/11/2012 1144   NA 141 06/24/2012 1620   K 3.5 11/11/2012 1144   K 3.1* 06/24/2012 1620   CL 106 11/11/2012 1144   CL 101 06/24/2012 1620   CO2 29 11/11/2012 1144   CO2 32 06/24/2012 1620   BUN 9.7 11/11/2012 1144   BUN 15 06/24/2012 1620   CREATININE 0.8 11/11/2012 1144   CREATININE 0.96 06/24/2012 1620      Component Value Date/Time   CALCIUM  9.1 11/11/2012 1144   CALCIUM 9.4 06/24/2012 1620   CALCIUM  Value: 5.7 CRITICAL RESULT CALLED TO, READ BACK BY AND VERIFIED WITH: JAMIE TRACY,RN [id number] @ 1433 BY J SCOTTON (NOTE)  Amended report. Result repeated and verified. CORRECTED ON 10/14 AT 1429: PREVIOUSLY REPORTED AS Result repeated and verified.* 06/13/2008 1605   ALKPHOS 60 11/11/2012 1144   ALKPHOS 50 06/24/2012 1620   AST 19 11/11/2012 1144   AST 19 06/24/2012 1620   ALT 17 11/11/2012 1144   ALT 15 06/24/2012 1620   BILITOT 0.35 11/11/2012 1144   BILITOT 0.2* 06/24/2012 1620       RADIOGRAPHIC STUDIES: No results found.  ASSESSMENT: this is a very pleasant 77 years old [place] American female with history of multiple myeloma currently on treatment with weekly subcutaneous Velcade and tolerating it fairly well. Her myeloma panel performed recently showed some evidence for increase in the free lambda light chain concerning for mild disease progression. She is continuing on her treatment with subcutaneous Velcade. The patient was discussed with Dr. [name] she will followup in 3 weeks for another symptom management visit with a repeat CBC differential, C. met and LDH. We'll plan to repeat her protein studies when she returns as this will be to months after her last myeloma panel. It was suggested that the patient might try, with the help of her daughters, soaking in  a tub of Epsom salts for relief of her lower extremity discomfort.  Shermon Bozzi E, PA-C   For the herpes zoster, the patient will continue on acyclovir. For the history of bilateral lower extremity deep venous thrombosis the patient will continue on her Coumadin followed by the Coumadin clinic. For pain management the patient will continue on OxyContin, OxyIR and Neurontin. She was advised to call  immediately if she has any concerning symptoms in the interval.  All questions were answered. The patient knows to call the clinic with any problems, questions or concerns. We can certainly see the patient much sooner if necessary.

## 2012-11-19 ENCOUNTER — Telehealth: Payer: Self-pay | Admitting: Dietician

## 2012-11-25 ENCOUNTER — Ambulatory Visit (HOSPITAL_BASED_OUTPATIENT_CLINIC_OR_DEPARTMENT_OTHER): Payer: Medicare Other

## 2012-11-25 ENCOUNTER — Other Ambulatory Visit (HOSPITAL_BASED_OUTPATIENT_CLINIC_OR_DEPARTMENT_OTHER): Payer: Medicare Other | Admitting: Lab

## 2012-11-25 VITALS — BP 139/78 | HR 88 | Temp 99.0°F | Resp 18

## 2012-11-25 DIAGNOSIS — Z5112 Encounter for antineoplastic immunotherapy: Secondary | ICD-10-CM

## 2012-11-25 DIAGNOSIS — C9000 Multiple myeloma not having achieved remission: Secondary | ICD-10-CM

## 2012-11-25 LAB — CBC WITH DIFFERENTIAL/PLATELET
Basophils Absolute: 0 10*3/uL (ref 0.0–0.1)
Eosinophils Absolute: 0 10*3/uL (ref 0.0–0.5)
HGB: 12.9 g/dL (ref 11.6–15.9)
MCV: 91.7 fL (ref 79.5–101.0)
MONO#: 0.4 10*3/uL (ref 0.1–0.9)
MONO%: 10.5 % (ref 0.0–14.0)
NEUT#: 2.8 10*3/uL (ref 1.5–6.5)
Platelets: 211 10*3/uL (ref 145–400)
RDW: 15.2 % — ABNORMAL HIGH (ref 11.2–14.5)
WBC: 3.9 10*3/uL (ref 3.9–10.3)

## 2012-11-25 LAB — COMPREHENSIVE METABOLIC PANEL (CC13)
Albumin: 3.1 g/dL — ABNORMAL LOW (ref 3.5–5.0)
Alkaline Phosphatase: 59 U/L (ref 40–150)
BUN: 15.2 mg/dL (ref 7.0–26.0)
CO2: 30 mEq/L — ABNORMAL HIGH (ref 22–29)
Calcium: 9.9 mg/dL (ref 8.4–10.4)
Chloride: 104 mEq/L (ref 98–107)
Glucose: 150 mg/dl — ABNORMAL HIGH (ref 70–99)
Potassium: 4 mEq/L (ref 3.5–5.1)
Sodium: 143 mEq/L (ref 136–145)
Total Protein: 6.3 g/dL — ABNORMAL LOW (ref 6.4–8.3)

## 2012-11-25 MED ORDER — ONDANSETRON HCL 8 MG PO TABS
8.0000 mg | ORAL_TABLET | Freq: Once | ORAL | Status: AC
  Administered 2012-11-25: 8 mg via ORAL

## 2012-11-25 MED ORDER — BORTEZOMIB CHEMO SQ INJECTION 3.5 MG (2.5MG/ML)
1.3000 mg/m2 | Freq: Once | INTRAMUSCULAR | Status: AC
  Administered 2012-11-25: 2 mg via SUBCUTANEOUS
  Filled 2012-11-25: qty 2

## 2012-11-25 MED ORDER — ZOLEDRONIC ACID 4 MG/100ML IV SOLN
4.0000 mg | Freq: Once | INTRAVENOUS | Status: AC
  Administered 2012-11-25: 4 mg via INTRAVENOUS
  Filled 2012-11-25: qty 100

## 2012-11-25 MED ORDER — ZOLEDRONIC ACID 4 MG/5ML IV CONC: 4.0000 mg | Freq: Once | INTRAVENOUS | Status: DC

## 2012-11-25 NOTE — Patient Instructions (Addendum)
[facility] Discharge Instructions for Patients Receiving Chemotherapy  Today you received the following chemotherapy agents Velcade and Zometa.  To help prevent nausea and vomiting after your treatment, we encourage you to take your nausea medication.   If you develop nausea and vomiting that is not controlled by your nausea medication, call the clinic. If it is after clinic hours your family physician or the after hours number for the clinic or go to the Emergency Department.   BELOW ARE SYMPTOMS THAT SHOULD BE REPORTED IMMEDIATELY:  *FEVER GREATER THAN 100.5 F  *CHILLS WITH OR WITHOUT FEVER  NAUSEA AND VOMITING THAT IS NOT CONTROLLED WITH YOUR NAUSEA MEDICATION  *UNUSUAL SHORTNESS OF BREATH  *UNUSUAL BRUISING OR BLEEDING  TENDERNESS IN MOUTH AND THROAT WITH OR WITHOUT PRESENCE OF ULCERS  *URINARY PROBLEMS  *BOWEL PROBLEMS  UNUSUAL RASH Items with * indicate a potential emergency and should be followed up as soon as possible.  One of the nurses will contact you 24 hours after your treatment. Please let the nurse know about any problems that you may have experienced. Feel free to call the clinic you have any questions or concerns. The clinic phone number is [phone].   I have been informed and understand all the instructions given to me. I know to contact the clinic, my physician, or go to the Emergency Department if any problems should occur. I do not have any questions at this time, but understand that I may call the clinic during office hours   should I have any questions or need assistance in obtaining follow up care.    __________________________________________  _____________  __________ Signature of Patient or Authorized Representative            Date                   Time    __________________________________________ Nurse's Signature    

## 2012-11-26 ENCOUNTER — Other Ambulatory Visit: Payer: Self-pay | Admitting: Internal Medicine

## 2012-11-26 ENCOUNTER — Telehealth: Payer: Self-pay | Admitting: Dietician

## 2012-11-29 ENCOUNTER — Other Ambulatory Visit: Payer: Self-pay | Admitting: Physician Assistant

## 2012-11-29 ENCOUNTER — Other Ambulatory Visit: Payer: Self-pay | Admitting: Medical Oncology

## 2012-11-29 DIAGNOSIS — C9000 Multiple myeloma not having achieved remission: Secondary | ICD-10-CM

## 2012-11-29 NOTE — Telephone Encounter (Signed)
Called in refills for xanax and coumadin

## 2012-11-30 ENCOUNTER — Telehealth: Payer: Self-pay | Admitting: Medical Oncology

## 2012-11-30 ENCOUNTER — Telehealth: Payer: Self-pay | Admitting: *Deleted

## 2012-11-30 DIAGNOSIS — C9000 Multiple myeloma not having achieved remission: Secondary | ICD-10-CM

## 2012-11-30 MED ORDER — ACYCLOVIR 400 MG PO TABS: 400.0000 mg | ORAL_TABLET | Freq: Two times a day (BID) | ORAL | Status: DC

## 2012-11-30 NOTE — Telephone Encounter (Signed)
Requests refill

## 2012-11-30 NOTE — Telephone Encounter (Signed)
Spoke with pharmacist.  They did not receive the refill ok for xanax that was called in yesterday 11/29/12.  Went ahead and gave it again exactly as it was documented as called in on 11/29/12.

## 2012-12-02 ENCOUNTER — Other Ambulatory Visit (HOSPITAL_BASED_OUTPATIENT_CLINIC_OR_DEPARTMENT_OTHER): Payer: Medicare Other | Admitting: Lab

## 2012-12-02 ENCOUNTER — Ambulatory Visit: Payer: Medicare Other | Admitting: Pharmacist

## 2012-12-02 ENCOUNTER — Ambulatory Visit (HOSPITAL_BASED_OUTPATIENT_CLINIC_OR_DEPARTMENT_OTHER): Payer: Medicare Other

## 2012-12-02 VITALS — BP 109/50 | HR 63 | Temp 98.3°F | Resp 18

## 2012-12-02 DIAGNOSIS — I82409 Acute embolism and thrombosis of unspecified deep veins of unspecified lower extremity: Secondary | ICD-10-CM

## 2012-12-02 DIAGNOSIS — C9000 Multiple myeloma not having achieved remission: Secondary | ICD-10-CM

## 2012-12-02 DIAGNOSIS — I82509 Chronic embolism and thrombosis of unspecified deep veins of unspecified lower extremity: Secondary | ICD-10-CM

## 2012-12-02 DIAGNOSIS — Z5112 Encounter for antineoplastic immunotherapy: Secondary | ICD-10-CM

## 2012-12-02 LAB — COMPREHENSIVE METABOLIC PANEL (CC13)
ALT: 14 U/L (ref 0–55)
AST: 20 U/L (ref 5–34)
Albumin: 3.2 g/dL — ABNORMAL LOW (ref 3.5–5.0)
CO2: 28 mEq/L (ref 22–29)
Calcium: 9.5 mg/dL (ref 8.4–10.4)
Chloride: 104 mEq/L (ref 98–107)
Potassium: 3.9 mEq/L (ref 3.5–5.1)
Sodium: 141 mEq/L (ref 136–145)
Total Protein: 6.6 g/dL (ref 6.4–8.3)

## 2012-12-02 LAB — CBC WITH DIFFERENTIAL/PLATELET
BASO%: 0.3 % (ref 0.0–2.0)
EOS%: 1.3 % (ref 0.0–7.0)
HCT: 38.7 % (ref 34.8–46.6)
MCH: 30.2 pg (ref 25.1–34.0)
MCHC: 32.8 g/dL (ref 31.5–36.0)
NEUT%: 72.6 % (ref 38.4–76.8)
RBC: 4.22 10*6/uL (ref 3.70–5.45)
RDW: 15 % — ABNORMAL HIGH (ref 11.2–14.5)
WBC: 4.4 10*3/uL (ref 3.9–10.3)
lymph#: 0.6 10*3/uL — ABNORMAL LOW (ref 0.9–3.3)

## 2012-12-02 LAB — PROTIME-INR: INR: 3.9 — ABNORMAL HIGH (ref 2.00–3.50)

## 2012-12-02 LAB — POCT INR: INR: 3.9

## 2012-12-02 MED ORDER — ONDANSETRON HCL 8 MG PO TABS
8.0000 mg | ORAL_TABLET | Freq: Once | ORAL | Status: AC
  Administered 2012-12-02: 8 mg via ORAL

## 2012-12-02 MED ORDER — BORTEZOMIB CHEMO SQ INJECTION 3.5 MG (2.5MG/ML)
1.3000 mg/m2 | Freq: Once | INTRAMUSCULAR | Status: AC
  Administered 2012-12-02: 2 mg via SUBCUTANEOUS
  Filled 2012-12-02: qty 2

## 2012-12-02 NOTE — Progress Notes (Signed)
INR above goal of 2-3.  Unsure why the increase.  [name] has been on current coumadin dose for 2 months.  No medication changes reported.  Instructed Lindsay Wallick to hold today's dose, then resume 5mg  daily but 2.5mg  Thurs/Sat.  Will check PT/INR next week with chemo appt.

## 2012-12-02 NOTE — Patient Instructions (Addendum)
[facility] Discharge Instructions for Patients Receiving Chemotherapy  Today you received the following chemotherapy agents Velcade.  To help prevent nausea and vomiting after your treatment, we encourage you to take your nausea medication as prescribed.   If you develop nausea and vomiting that is not controlled by your nausea medication, call the clinic. If it is after clinic hours your family physician or the after hours number for the clinic or go to the Emergency Department.   BELOW ARE SYMPTOMS THAT SHOULD BE REPORTED IMMEDIATELY:  *FEVER GREATER THAN 100.5 F  *CHILLS WITH OR WITHOUT FEVER  NAUSEA AND VOMITING THAT IS NOT CONTROLLED WITH YOUR NAUSEA MEDICATION  *UNUSUAL SHORTNESS OF BREATH  *UNUSUAL BRUISING OR BLEEDING  TENDERNESS IN MOUTH AND THROAT WITH OR WITHOUT PRESENCE OF ULCERS  *URINARY PROBLEMS  *BOWEL PROBLEMS  UNUSUAL RASH Items with * indicate a potential emergency and should be followed up as soon as possible.  Feel free to call the clinic you have any questions or concerns. The clinic phone number is [phone].   I have been informed and understand all the instructions given to me. I know to contact the clinic, my physician, or go to the Emergency Department if any problems should occur. I do not have any questions at this time, but understand that I may call the clinic during office hours   should I have any questions or need assistance in obtaining follow up care.    __________________________________________  _____________  __________ Signature of Patient or Authorized Representative            Date                   Time    __________________________________________ Nurse's Signature    

## 2012-12-07 ENCOUNTER — Other Ambulatory Visit: Payer: Self-pay | Admitting: *Deleted

## 2012-12-07 ENCOUNTER — Other Ambulatory Visit: Payer: Self-pay

## 2012-12-07 DIAGNOSIS — C9000 Multiple myeloma not having achieved remission: Secondary | ICD-10-CM

## 2012-12-07 DIAGNOSIS — Z1231 Encounter for screening mammogram for malignant neoplasm of breast: Secondary | ICD-10-CM

## 2012-12-07 MED ORDER — OXYCODONE HCL 10 MG PO TB12: 10.0000 mg | ORAL_TABLET | Freq: Two times a day (BID) | ORAL | Status: DC

## 2012-12-07 MED ORDER — OXYCODONE HCL 5 MG PO TABS: ORAL_TABLET | ORAL | Status: DC

## 2012-12-08 ENCOUNTER — Encounter: Payer: Self-pay | Admitting: Family Medicine

## 2012-12-08 ENCOUNTER — Ambulatory Visit (INDEPENDENT_AMBULATORY_CARE_PROVIDER_SITE_OTHER): Payer: Medicare Other | Admitting: Family Medicine

## 2012-12-08 VITALS — BP 120/66 | HR 81 | Temp 98.8°F | Wt 132.0 lb

## 2012-12-08 DIAGNOSIS — B029 Zoster without complications: Secondary | ICD-10-CM

## 2012-12-08 DIAGNOSIS — C9000 Multiple myeloma not having achieved remission: Secondary | ICD-10-CM

## 2012-12-08 DIAGNOSIS — E119 Type 2 diabetes mellitus without complications: Secondary | ICD-10-CM

## 2012-12-08 NOTE — Assessment & Plan Note (Signed)
A1c pending, continue as is for now.  We can set f/u after I see her labs.  She and family agree. >25 min spent with face to face with patient, >50% counseling and/or coordinating care.

## 2012-12-08 NOTE — Patient Instructions (Addendum)
Please ask about getting your A1c drawn tomorrow.  Take plain claritin once a day as needed; claritin D may increase your BP.  Take care.  We'll set follow up once I see you labs.

## 2012-12-08 NOTE — Progress Notes (Signed)
Diabetes:  Using medications without difficulties:yes Hypoglycemic episodes:no Hyperglycemic episodes:no Feet problems:no Blood Sugars averaging: 120-140 Due for A1c, to be drawn tomorrow with other heme labs.    MM- routine f/u with onc.  Pain generally tolerable.  Pain in L leg from shingles with better control and less sedation on gabapentin vs lyrica.  She is up and about at home, doing some housework. She isn't SOB and isn't having CP. She isn't sedated with current pain meds.   PMH and SH reviewed  Meds, vitals, and allergies reviewed.   ROS: See HPI.  Otherwise negative.    GEN: nad, alert and oriented, in wc.  HEENT: mucous membranes moist NECK: supple w/o LA CV: rrr. PULM: ctab, no inc wob ABD: soft, +bs EXT: no edema SKIN: no acute rash but L thigh is hypersensitive to touch.

## 2012-12-08 NOTE — Assessment & Plan Note (Signed)
Reasonable control of pain with current meds.  Continue as is.  We are trying to balance side effects and relief. I wouldn't inc the gabapentin now.

## 2012-12-08 NOTE — Assessment & Plan Note (Signed)
She has routine f/u with onc pending.  Her pain is reasonably controlled. Trying to balance sedation and pain relief.

## 2012-12-09 ENCOUNTER — Telehealth: Payer: Self-pay | Admitting: Internal Medicine

## 2012-12-09 ENCOUNTER — Encounter: Payer: Self-pay | Admitting: Physician Assistant

## 2012-12-09 ENCOUNTER — Other Ambulatory Visit (HOSPITAL_BASED_OUTPATIENT_CLINIC_OR_DEPARTMENT_OTHER): Payer: Medicare Other | Admitting: Lab

## 2012-12-09 ENCOUNTER — Ambulatory Visit: Payer: Medicare Other | Admitting: Pharmacist

## 2012-12-09 ENCOUNTER — Ambulatory Visit (HOSPITAL_BASED_OUTPATIENT_CLINIC_OR_DEPARTMENT_OTHER): Payer: Medicare Other | Admitting: Physician Assistant

## 2012-12-09 ENCOUNTER — Ambulatory Visit (HOSPITAL_BASED_OUTPATIENT_CLINIC_OR_DEPARTMENT_OTHER): Payer: Medicare Other

## 2012-12-09 ENCOUNTER — Telehealth: Payer: Self-pay | Admitting: Family Medicine

## 2012-12-09 VITALS — BP 119/73 | HR 81 | Temp 96.6°F | Resp 20 | Ht 60.0 in | Wt 131.2 lb

## 2012-12-09 DIAGNOSIS — Z86718 Personal history of other venous thrombosis and embolism: Secondary | ICD-10-CM

## 2012-12-09 DIAGNOSIS — C9000 Multiple myeloma not having achieved remission: Secondary | ICD-10-CM

## 2012-12-09 DIAGNOSIS — M79609 Pain in unspecified limb: Secondary | ICD-10-CM

## 2012-12-09 DIAGNOSIS — I82409 Acute embolism and thrombosis of unspecified deep veins of unspecified lower extremity: Secondary | ICD-10-CM

## 2012-12-09 DIAGNOSIS — B029 Zoster without complications: Secondary | ICD-10-CM

## 2012-12-09 DIAGNOSIS — Z5112 Encounter for antineoplastic immunotherapy: Secondary | ICD-10-CM

## 2012-12-09 LAB — LACTATE DEHYDROGENASE (CC13): LDH: 272 U/L — ABNORMAL HIGH (ref 125–245)

## 2012-12-09 LAB — CBC WITH DIFFERENTIAL/PLATELET
BASO%: 0.4 % (ref 0.0–2.0)
Basophils Absolute: 0 10*3/uL (ref 0.0–0.1)
HCT: 37 % (ref 34.8–46.6)
HGB: 12.2 g/dL (ref 11.6–15.9)
MONO#: 0.5 10*3/uL (ref 0.1–0.9)
NEUT#: 2.6 10*3/uL (ref 1.5–6.5)
NEUT%: 72.4 % (ref 38.4–76.8)
WBC: 3.6 10*3/uL — ABNORMAL LOW (ref 3.9–10.3)
lymph#: 0.4 10*3/uL — ABNORMAL LOW (ref 0.9–3.3)

## 2012-12-09 LAB — POCT INR: INR: 2.4

## 2012-12-09 LAB — PROTIME-INR

## 2012-12-09 MED ORDER — ONDANSETRON HCL 8 MG PO TABS
8.0000 mg | ORAL_TABLET | Freq: Once | ORAL | Status: AC
  Administered 2012-12-09: 8 mg via ORAL

## 2012-12-09 MED ORDER — BORTEZOMIB CHEMO SQ INJECTION 3.5 MG (2.5MG/ML)
1.3000 mg/m2 | Freq: Once | INTRAMUSCULAR | Status: AC
  Administered 2012-12-09: 2 mg via SUBCUTANEOUS
  Filled 2012-12-09: qty 2

## 2012-12-09 NOTE — Telephone Encounter (Signed)
[name] pts daughter left v/m request call back re; pt cannot get A1C done at [facility] today; needs hand written order.

## 2012-12-09 NOTE — Telephone Encounter (Signed)
Pt's daughter called in and said that [facility] says they cannot draw the A1c you ordered b/c they can't see the order.  [name] will be going back next Thursday, December 16, 2012 and can get it drawn then as long as they have the order.  Thank you.

## 2012-12-09 NOTE — Telephone Encounter (Signed)
See below.  Is it okay if I write out the order and stamp it with your signature stamp?

## 2012-12-09 NOTE — Telephone Encounter (Signed)
gv and printed appt schedule for pt for Aopril ...emailed michelle to add tx for May...pt aware

## 2012-12-09 NOTE — Patient Instructions (Addendum)
Continue weekly labs and weekly Velcade Continue coumadin as instructed by the coumadin clinic Follow up with Dr. [name] in 1 week to discuss the results of your protein studies

## 2012-12-09 NOTE — Patient Instructions (Addendum)
[facility] Discharge Instructions for Patients Receiving Chemotherapy  Today you received the following chemotherapy agents: Velcade To help prevent nausea and vomiting after your treatment, we encourage you to take your nausea medication as often as prescribed.   If you develop nausea and vomiting that is not controlled by your nausea medication, call the clinic. If it is after clinic hours, call your family physician or the after hours number for the clinic or go to the Emergency Department.   BELOW ARE SYMPTOMS THAT SHOULD BE REPORTED IMMEDIATELY:  *FEVER GREATER THAN 100.5 F  *CHILLS WITH OR WITHOUT FEVER  NAUSEA AND VOMITING THAT IS NOT CONTROLLED WITH YOUR NAUSEA MEDICATION  *UNUSUAL SHORTNESS OF BREATH  *UNUSUAL BRUISING OR BLEEDING  TENDERNESS IN MOUTH AND THROAT WITH OR WITHOUT PRESENCE OF ULCERS  *URINARY PROBLEMS  *BOWEL PROBLEMS  UNUSUAL RASH Items with * indicate a potential emergency and should be followed up as soon as possible.   The clinic phone number is [phone].

## 2012-12-09 NOTE — Progress Notes (Signed)
INR = 2.4 on 5 mg/day; 2.5 mg Th/Sat after holding 1 dose last week for elevated INR Pt has had no med changes. No c/o bleeding/bruising. INR at goal.  Cont 5 mg/day; 2.5 mg Th/Sat. Recheck INR in 2 wks w/ tx & if INR back up then, we should consider adding addtl day at 2.5 mg. Ebony Hail, Pharm.D., CPP 12/09/2012@3 :21 PM

## 2012-12-10 NOTE — Telephone Encounter (Signed)
Please, thank you.  Please fax it over.  I cancelled the order in epic.

## 2012-12-10 NOTE — Telephone Encounter (Signed)
Thanks.  I'll sign Monday.

## 2012-12-10 NOTE — Telephone Encounter (Signed)
Order written and ready for signature in your in box.  Fax to [facility] after signing.

## 2012-12-13 LAB — KAPPA/LAMBDA LIGHT CHAINS
Kappa free light chain: 1.12 mg/dL (ref 0.33–1.94)
Lambda Free Lght Chn: 23.5 mg/dL — ABNORMAL HIGH (ref 0.57–2.63)

## 2012-12-13 LAB — SPEP & IFE WITH QIG
Albumin ELP: 60.1 % (ref 55.8–66.1)
Alpha-1-Globulin: 5.4 % — ABNORMAL HIGH (ref 2.9–4.9)
Beta 2: 5.3 % (ref 3.2–6.5)
Beta Globulin: 6.2 % (ref 4.7–7.2)

## 2012-12-13 NOTE — Progress Notes (Signed)
[facility] [phone]   [phone]  OFFICE PROGRESS NOTE  [name], MD [address] [place] [address]  DIAGNOSIS:  1. multiple myeloma diagnosed in September 2009,  2. history of bilateral lower extremity deep vein thrombosis diagnosed in November 2009  3. acute on chronic deep vein thrombosis in the left lower extremity diagnosed in October 2010.   PRIOR THERAPY:  1. status post palliative radiotherapy to the right femur and is she'll tuberosity, first was done in December 2011 and the second treatment was completed Jan 08 2011, and the third treatment to the right distal femur completed on 04/30/2011  2. status post palliative radiotherapy to the left proximal femur completed 02/28/2011.  3. Revlimid 25 mg by mouth daily for 21 days every 4 weeks in addition to oral Decadron at 40 mg by mouth on a weekly basis status post 40 cycles.   CURRENT THERAPY:  1. weekly subcutaneous Velcade 1.3 mg/M2 status post 59 cycles.  2. Coumadin 5 mg on Mondays and Thursdays and 2.5 mg on all other days.  3. Zometa 4 mg IV given every 2 months.  INTERVAL HISTORY: [name] 77 y.o. female returns to the clinic today for followup visit accompanied by her daughter. Today she complains of bilateral leg pain. This is a long-standing complaint that was exacerbated by a bout with herpes zoster. Her medications remain the same including gabapentin, OxyContin and OxyIR. The patient is also currently on treatment with gabapentin in addition to her pain medications with OxyContin and OxyIR. She is tolerating her treatment with Velcade fairly well with no significant adverse effects. She denied having any significant fatigue or weakness. She denied having any weight loss or night sweats. She denied having any chest pain, shortness breath, cough or hemoptysis. Her primary care doctor would like her to get a hemoglobin A1c drawn with the results sent to him as  she is getting her weekly labs drawn here. She complains of leg pain and get some relief from her current pain medications but sometimes it is not quite enough. She had protein studies drawn today for reevaluation of her disease.  MEDICAL HISTORY: Past Medical History  Diagnosis Date  . Blood transfusion   . Depression     Mild  . History of chicken pox   . Hyperlipidemia   . Hypertension   . Degenerative joint disease   . Diabetes mellitus     Steroid related  . Phlebitis   . Multiple myeloma(203.0)     per Dr. [name], s/p palliative radiation for leg pain 2011 per Dr. Roselind Messier  . Deep vein thrombosis of bilateral lower extremities     ALLERGIES:  has No Known Allergies.  MEDICATIONS:  Current Outpatient Prescriptions  Medication Sig Dispense Refill  . acyclovir (ZOVIRAX) 400 MG tablet Take 1 tablet (400 mg total) by mouth 2 (two) times daily.  60 tablet  0  . ALPRAZolam (XANAX) 0.5 MG tablet TAKE ONE TABLET BY MOUTH THREE TIMES DAILY AS NEEDED FOR SLEEP  60 tablet  0  . Calcium Carbonate-Vitamin D 600-400 MG-UNIT per tablet Take 1 tablet by mouth 3 (three) times daily with meals.        Marland Kitchen dexamethasone (DECADRON) 4 MG tablet TAKE TEN TABLETS BY MOUTH ONCE A WEEK  40 tablet  1  . gabapentin (NEURONTIN) 100 MG capsule 100 mg. Takes 3  ( 300 mg) by mouth TID      .  glipiZIDE (GLUCOTROL XL) 2.5 MG 24 hr tablet Take 1 tablet (2.5 mg total) by mouth daily.  90 tablet  0  . glucose blood (ONE TOUCH ULTRA TEST) test strip Use as instructed  25 each  6  . ibuprofen (ADVIL,MOTRIN) 200 MG tablet Take 200 mg by mouth every 6 (six) hours as needed.        Marland Kitchen KLOR-CON M20 20 MEQ tablet TAKE ONE TABLET BY MOUTH EVERY DAY  30 each  1  . Lancets (ONETOUCH ULTRASOFT) lancets Test blood sugar once daily or as directed.  100 each  11  . lidocaine (LIDODERM) 5 % Place 1 patch onto the skin daily. Remove & Discard patch within 12 hours or as directed by MD  30 patch  0  . lisinopril-hydrochlorothiazide  (PRINZIDE,ZESTORETIC) 20-12.5 MG per tablet Take 0.5 tablets by mouth daily.  45 tablet  3  . magnesium oxide (MAG-OX) 400 MG tablet Take 400 mg by mouth daily.       Marland Kitchen oxyCODONE (OXY IR/ROXICODONE) 5 MG immediate release tablet Take 1 1/2 tabs by mouth every 6 hours as needed for pain  90 tablet  0  . oxyCODONE (OXYCONTIN) 10 MG 12 hr tablet Take 1 tablet (10 mg total) by mouth every 12 (twelve) hours.  60 tablet  0  . potassium chloride SA (K-DUR,KLOR-CON) 20 MEQ tablet Take 1 tablet (20 mEq total) by mouth daily.  7 tablet  0  . prochlorperazine (COMPAZINE) 10 MG tablet Take 1 tablet (10 mg total) by mouth every 6 (six) hours as needed.  30 tablet  1  . temazepam (RESTORIL) 30 MG capsule TAKE ONE CAPSULE BY MOUTH AT BEDTIME  30 capsule  0  . warfarin (COUMADIN) 5 MG tablet TAKE ONE TABLET BY MOUTH ONCE DAILY ON TUESDAY, THURSDAY AND SATURDAY.  TAKE 1/2 TABLET ON ALL OTHER DAYS OR AS DIRECTED  60 tablet  0   No current facility-administered medications for this visit.   Facility-Administered Medications Ordered in Other Visits  Medication Dose Route Frequency Provider Last Rate Last Dose  . Zoledronic Acid (ZOMETA) 4 mg IVPB  4 mg Intravenous Once Si Gaul, MD        SURGICAL HISTORY:  Past Surgical History  Procedure Laterality Date  . Abdominal hysterectomy      REVIEW OF SYSTEMS:  A comprehensive review of systems was negative except for: Musculoskeletal: positive for bone pain and myalgias Neurological: positive for paresthesia   PHYSICAL EXAMINATION: General appearance: alert, cooperative and no distress Head: Normocephalic, without obvious abnormality, atraumatic Neck: no adenopathy Lymph nodes: Cervical, supraclavicular, and axillary nodes normal. Resp: clear to auscultation bilaterally Cardio: regular rate and rhythm, S1, S2 normal, no murmur, click, rub or gallop GI: soft, non-tender; bowel sounds normal; no masses,  no organomegaly Extremities: extremities normal,  atraumatic, no cyanosis or edema Neurologic: Alert and oriented X 3, normal strength and tone. Normal symmetric reflexes. Normal coordination and gait  ECOG PERFORMANCE STATUS: 1 - Symptomatic but completely ambulatory  Blood pressure 119/73, pulse 81, temperature 96.6 F (35.9 C), temperature source Oral, resp. rate 20, height 5' (1.524 m), weight 131 lb 3.2 oz (59.512 kg).  LABORATORY DATA: Lab Results  Component Value Date   WBC 3.6* 12/09/2012   HGB 12.2 12/09/2012   HCT 37.0 12/09/2012   MCV 91.6 12/09/2012   PLT 188 12/09/2012      Chemistry      Component Value Date/Time   NA 141 12/02/2012 1134   NA  141 06/24/2012 1620   K 3.9 12/02/2012 1134   K 3.1* 06/24/2012 1620   CL 104 12/02/2012 1134   CL 101 06/24/2012 1620   CO2 28 12/02/2012 1134   CO2 32 06/24/2012 1620   BUN 17.3 12/02/2012 1134   BUN 15 06/24/2012 1620   CREATININE 1.0 12/02/2012 1134   CREATININE 0.96 06/24/2012 1620      Component Value Date/Time   CALCIUM 9.5 12/02/2012 1134   CALCIUM 9.4 06/24/2012 1620   CALCIUM  Value: 5.7 CRITICAL RESULT CALLED TO, READ BACK BY AND VERIFIED WITH: JAMIE TRACY,RN [id number] @ 1433 BY J SCOTTON (NOTE)  Amended report. Result repeated and verified. CORRECTED ON 10/14 AT 1429: PREVIOUSLY REPORTED AS Result repeated and verified.* 06/13/2008 1605   ALKPHOS 61 12/02/2012 1134   ALKPHOS 50 06/24/2012 1620   AST 20 12/02/2012 1134   AST 19 06/24/2012 1620   ALT 14 12/02/2012 1134   ALT 15 06/24/2012 1620   BILITOT 0.36 12/02/2012 1134   BILITOT 0.2* 06/24/2012 1620       RADIOGRAPHIC STUDIES: No results found.  ASSESSMENT/PLAN: this is a very pleasant 77 years old [place] American female with history of multiple myeloma currently on treatment with weekly subcutaneous Velcade and tolerating it fairly well. Her myeloma panel performed recently showed some evidence for increase in the free lambda light chain concerning for mild disease progression. She is continuing on her treatment with  subcutaneous Velcade. The patient was discussed with Dr. [name]. She'll follow with Dr. [name] in one week to discuss the results of her myeloma panel to reevaluate her disease. She will continue with her weekly Velcade as scheduled and her Zometa given every 2 months. Her Coumadin will continue to be monitored and managed by the Coumadin clinic.   Divit Stipp E, PA-C   For the herpes zoster, the patient will continue on acyclovir. For the history of bilateral lower extremity deep venous thrombosis the patient will continue on her Coumadin followed by the Coumadin clinic. For pain management the patient will continue on OxyContin, OxyIR and Neurontin. She was advised to call  immediately if she has any concerning symptoms in the interval.  All questions were answered. The patient knows to call the clinic with any problems, questions or concerns. We can certainly see the patient much sooner if necessary.

## 2012-12-16 ENCOUNTER — Encounter: Payer: Self-pay | Admitting: Internal Medicine

## 2012-12-16 ENCOUNTER — Other Ambulatory Visit (HOSPITAL_BASED_OUTPATIENT_CLINIC_OR_DEPARTMENT_OTHER): Payer: Medicare Other | Admitting: Lab

## 2012-12-16 ENCOUNTER — Telehealth: Payer: Self-pay | Admitting: *Deleted

## 2012-12-16 ENCOUNTER — Other Ambulatory Visit: Payer: Medicare Other | Admitting: Lab

## 2012-12-16 ENCOUNTER — Other Ambulatory Visit: Payer: Medicare Other

## 2012-12-16 ENCOUNTER — Ambulatory Visit (HOSPITAL_BASED_OUTPATIENT_CLINIC_OR_DEPARTMENT_OTHER): Payer: Medicare Other | Admitting: Internal Medicine

## 2012-12-16 ENCOUNTER — Ambulatory Visit: Payer: Medicare Other

## 2012-12-16 VITALS — BP 144/66 | HR 77 | Temp 97.1°F | Resp 18 | Ht 60.0 in | Wt 132.4 lb

## 2012-12-16 DIAGNOSIS — C9000 Multiple myeloma not having achieved remission: Secondary | ICD-10-CM

## 2012-12-16 LAB — CBC WITH DIFFERENTIAL/PLATELET
Basophils Absolute: 0 10*3/uL (ref 0.0–0.1)
Eosinophils Absolute: 0.1 10*3/uL (ref 0.0–0.5)
HCT: 35.9 % (ref 34.8–46.6)
HGB: 12 g/dL (ref 11.6–15.9)
LYMPH%: 8.7 % — ABNORMAL LOW (ref 14.0–49.7)
MCV: 92.3 fL (ref 79.5–101.0)
MONO#: 0.4 10*3/uL (ref 0.1–0.9)
MONO%: 10.3 % (ref 0.0–14.0)
NEUT#: 3.2 10*3/uL (ref 1.5–6.5)
NEUT%: 79.2 % — ABNORMAL HIGH (ref 38.4–76.8)
Platelets: 198 10*3/uL (ref 145–400)
WBC: 4.1 10*3/uL (ref 3.9–10.3)

## 2012-12-16 NOTE — Telephone Encounter (Signed)
Per Dr Donnald Garre, pt is to start on Pomalyst 4mg  daily x 21 days, 7 days off.  Pomalyst RevAssist packet filled out, signed by pt and Dr Donnald Garre, faxed to [employer].

## 2012-12-16 NOTE — Patient Instructions (Signed)
Your myeloma panel showed some evidence for disease progression. We discussed changing treatment from subcutaneous Velcade to oral Pomalyst. Continue Decadron 40 mg by mouth on weekly basis. Followup visit in 2 weeks

## 2012-12-16 NOTE — Telephone Encounter (Addendum)
Received confirmation from [employer] that pt has been signed up for the Pomalyst REMS program.  Rx printed with new auth #, waiting for MD signature, and then will fax demographics, medication list, insurance info, pomalyst REMS confirmation form, and rx with auth # to [employer].  SLJ

## 2012-12-16 NOTE — Progress Notes (Signed)
[facility] Telephone:([phone]) [phone]   Fax:([phone]  OFFICE PROGRESS NOTE  [name], MD [address] [place] [address]  DIAGNOSIS:  1. multiple myeloma diagnosed in September 2009,  2. history of bilateral lower extremity deep vein thrombosis diagnosed in November 2009  3. acute on chronic deep vein thrombosis in the left lower extremity diagnosed in October 2010.   PRIOR THERAPY:  1. status post palliative radiotherapy to the right femur and is she'll tuberosity, first was done in December 2011 and the second treatment was completed Jan 08 2011, and the third treatment to the right distal femur completed on 04/30/2011  2. status post palliative radiotherapy to the left proximal femur completed 02/28/2011.  3. Revlimid 25 mg by mouth daily for 21 days every 4 weeks in addition to oral Decadron at 40 mg by mouth on a weekly basis status post 40 cycles.  4. weekly subcutaneous Velcade 1.3 mg/M2 status post 60 cycles, discontinued today secondary to mild disease progression.  CURRENT THERAPY:  1. Pomalyst 4 mg by mouth daily for 21 days every 3 weeks, the patient will start the first dose in few days, in addition to Decadron 40 mg weekly. 2. Coumadin 5 mg on Mondays and Thursdays and 2.5 mg on all other days.  3. Zometa 4 mg IV given every 2 months.   INTERVAL HISTORY: [name] 77 y.o. female returns to the clinic today for followup visit accompanied by her 2 daughters. The patient is doing fine today with no specific complaints. She denied having any significant fatigue weakness. She denied having any chest pain, shortness of breath, cough or hemoptysis. She continues to have pain and peripheral neuropathy in the lower extremities. She had repeat myeloma panel performed recently and she is here for evaluation and discussion of her lab results.   MEDICAL HISTORY: Past Medical History  Diagnosis Date  . Blood transfusion   . Depression    Mild  . History of chicken pox   . Hyperlipidemia   . Hypertension   . Degenerative joint disease   . Diabetes mellitus     Steroid related  . Phlebitis   . Multiple myeloma(203.0)     per Dr. [name], s/p palliative radiation for leg pain 2011 per Dr. Roselind Messier  . Deep vein thrombosis of bilateral lower extremities     ALLERGIES:  has No Known Allergies.  MEDICATIONS:  Current Outpatient Prescriptions  Medication Sig Dispense Refill  . acyclovir (ZOVIRAX) 400 MG tablet Take 1 tablet (400 mg total) by mouth 2 (two) times daily.  60 tablet  0  . ALPRAZolam (XANAX) 0.5 MG tablet TAKE ONE TABLET BY MOUTH THREE TIMES DAILY AS NEEDED FOR SLEEP  60 tablet  0  . Calcium Carbonate-Vitamin D 600-400 MG-UNIT per tablet Take 1 tablet by mouth 3 (three) times daily with meals.        Marland Kitchen dexamethasone (DECADRON) 4 MG tablet TAKE TEN TABLETS BY MOUTH ONCE A WEEK  40 tablet  1  . gabapentin (NEURONTIN) 100 MG capsule 100 mg. Takes 3  ( 300 mg) by mouth TID      . glipiZIDE (GLUCOTROL XL) 2.5 MG 24 hr tablet Take 1 tablet (2.5 mg total) by mouth daily.  90 tablet  0  . glucose blood (ONE TOUCH ULTRA TEST) test strip Use as instructed  25 each  6  . ibuprofen (ADVIL,MOTRIN) 200 MG tablet Take 200 mg by mouth every 6 (  six) hours as needed.        Marland Kitchen KLOR-CON M20 20 MEQ tablet TAKE ONE TABLET BY MOUTH EVERY DAY  30 each  1  . Lancets (ONETOUCH ULTRASOFT) lancets Test blood sugar once daily or as directed.  100 each  11  . lidocaine (LIDODERM) 5 % Place 1 patch onto the skin daily. Remove & Discard patch within 12 hours or as directed by MD  30 patch  0  . lisinopril-hydrochlorothiazide (PRINZIDE,ZESTORETIC) 20-12.5 MG per tablet Take 0.5 tablets by mouth daily.  45 tablet  3  . magnesium oxide (MAG-OX) 400 MG tablet Take 400 mg by mouth daily.       Marland Kitchen oxyCODONE (OXY IR/ROXICODONE) 5 MG immediate release tablet Take 1 1/2 tabs by mouth every 6 hours as needed for pain  90 tablet  0  . oxyCODONE (OXYCONTIN) 10  MG 12 hr tablet Take 1 tablet (10 mg total) by mouth every 12 (twelve) hours.  60 tablet  0  . potassium chloride SA (K-DUR,KLOR-CON) 20 MEQ tablet Take 1 tablet (20 mEq total) by mouth daily.  7 tablet  0  . prochlorperazine (COMPAZINE) 10 MG tablet Take 1 tablet (10 mg total) by mouth every 6 (six) hours as needed.  30 tablet  1  . temazepam (RESTORIL) 30 MG capsule TAKE ONE CAPSULE BY MOUTH AT BEDTIME  30 capsule  0  . warfarin (COUMADIN) 5 MG tablet TAKE ONE TABLET BY MOUTH ONCE DAILY ON TUESDAY, THURSDAY AND SATURDAY.  TAKE 1/2 TABLET ON ALL OTHER DAYS OR AS DIRECTED  60 tablet  0   No current facility-administered medications for this visit.   Facility-Administered Medications Ordered in Other Visits  Medication Dose Route Frequency Provider Last Rate Last Dose  . Zoledronic Acid (ZOMETA) 4 mg IVPB  4 mg Intravenous Once Si Gaul, MD        SURGICAL HISTORY:  Past Surgical History  Procedure Laterality Date  . Abdominal hysterectomy      REVIEW OF SYSTEMS:  A comprehensive review of systems was negative except for: Constitutional: positive for fatigue Neurological: positive for paresthesia   PHYSICAL EXAMINATION: General appearance: alert, cooperative and no distress Head: Normocephalic, without obvious abnormality, atraumatic Neck: no adenopathy Lymph nodes: Cervical, supraclavicular, and axillary nodes normal. Resp: clear to auscultation bilaterally Cardio: regular rate and rhythm, S1, S2 normal, no murmur, click, rub or gallop GI: soft, non-tender; bowel sounds normal; no masses,  no organomegaly Extremities: extremities normal, atraumatic, no cyanosis or edema  ECOG PERFORMANCE STATUS: 1 - Symptomatic but completely ambulatory  Blood pressure 144/66, pulse 77, temperature 97.1 F (36.2 C), temperature source Oral, resp. rate 18, height 5' (1.524 m), weight 132 lb 6.4 oz (60.056 kg).  LABORATORY DATA: Lab Results  Component Value Date   WBC 4.1 12/16/2012   HGB  12.0 12/16/2012   HCT 35.9 12/16/2012   MCV 92.3 12/16/2012   PLT 198 12/16/2012      Chemistry      Component Value Date/Time   NA 141 12/02/2012 1134   NA 141 06/24/2012 1620   K 3.9 12/02/2012 1134   K 3.1* 06/24/2012 1620   CL 104 12/02/2012 1134   CL 101 06/24/2012 1620   CO2 28 12/02/2012 1134   CO2 32 06/24/2012 1620   BUN 17.3 12/02/2012 1134   BUN 15 06/24/2012 1620   CREATININE 1.0 12/02/2012 1134   CREATININE 0.96 06/24/2012 1620      Component Value Date/Time   CALCIUM  9.5 12/02/2012 1134   CALCIUM 9.4 06/24/2012 1620   CALCIUM  Value: 5.7 CRITICAL RESULT CALLED TO, READ BACK BY AND VERIFIED WITH: JAMIE TRACY,RN [id number] @ 1433 BY J SCOTTON (NOTE)  Amended report. Result repeated and verified. CORRECTED ON 10/14 AT 1429: PREVIOUSLY REPORTED AS Result repeated and verified.* 06/13/2008 1605   ALKPHOS 61 12/02/2012 1134   ALKPHOS 50 06/24/2012 1620   AST 20 12/02/2012 1134   AST 19 06/24/2012 1620   ALT 14 12/02/2012 1134   ALT 15 06/24/2012 1620   BILITOT 0.36 12/02/2012 1134   BILITOT 0.2* 06/24/2012 1620     Other lab results: beta-2 microglobulin 2.71, free kappa light chain 1.12, free lambda light chain 23.50, kappa/lambda ratio 0.05. IgG 577, IgA 80 and IgM 102.  RADIOGRAPHIC STUDIES: No results found.  ASSESSMENT: this is a very pleasant 77 years old [place] American female with history of multiple myeloma diagnosed several years ago and has been on several treatment including Revlimid and recently Velcade but unfortunately she continues to have evidence for disease progression with increase and the free lambda light chain.   PLAN: I discussed the lab result with the patient and her family. I recommended for her to discontinue treatment with subcutaneous Velcade at this point. I would consider the patient for treatment with Pomalyst 4 mg by mouth daily for 21 days every 4 weeks. I discussed with the patient adverse effect of this treatment including but not limited to alopecia,  suppression, nausea and vomiting, peripheral neuropathy as well as renal insufficiency.  For the peripheral neuropathy, the patient will continue on Neurontin, in addition to her current pain medication in the form of OxyContin and OxyIR For the history of shingles, she will continue on acyclovir 400 mg by mouth 2 times a day. She would come back for followup visit in 2 weeks for evaluation and management any adverse effect of the new regimen. The patient would also continue on Decadron 40 mg on a weekly basis with the chemotherapy. All questions were answered. The patient knows to call the clinic with any problems, questions or concerns. We can certainly see the patient much sooner if necessary.  I spent 20 minutes counseling the patient face to face. The total time spent in the appointment was 30 minutes.

## 2012-12-17 ENCOUNTER — Other Ambulatory Visit: Payer: Self-pay | Admitting: *Deleted

## 2012-12-17 DIAGNOSIS — C9000 Multiple myeloma not having achieved remission: Secondary | ICD-10-CM

## 2012-12-17 MED ORDER — POMALIDOMIDE 4 MG PO CAPS: 4.0000 mg | ORAL_CAPSULE | Freq: Every day | ORAL | Status: DC

## 2012-12-17 NOTE — Telephone Encounter (Signed)
Rec'd fax confirmation from [employer] that patient Prior Auth Request for Pomalyst 4mg  has been approved through 08/18/2013 under Medicare Part D benefit. Reference [id number]. GPI/NDC [id number]

## 2012-12-20 ENCOUNTER — Encounter: Payer: Self-pay | Admitting: Family Medicine

## 2012-12-20 ENCOUNTER — Ambulatory Visit: Payer: Medicare Other | Admitting: Internal Medicine

## 2012-12-21 ENCOUNTER — Encounter: Payer: Self-pay | Admitting: *Deleted

## 2012-12-22 ENCOUNTER — Telehealth: Payer: Self-pay | Admitting: Internal Medicine

## 2012-12-22 NOTE — Telephone Encounter (Signed)
s.w. daughter and advised on 4.24.14 and 5.1.14

## 2012-12-23 ENCOUNTER — Ambulatory Visit (HOSPITAL_BASED_OUTPATIENT_CLINIC_OR_DEPARTMENT_OTHER): Payer: Medicare Other | Admitting: Pharmacist

## 2012-12-23 ENCOUNTER — Other Ambulatory Visit (HOSPITAL_BASED_OUTPATIENT_CLINIC_OR_DEPARTMENT_OTHER): Payer: Medicare Other

## 2012-12-23 ENCOUNTER — Ambulatory Visit: Payer: Medicare Other

## 2012-12-23 DIAGNOSIS — I82409 Acute embolism and thrombosis of unspecified deep veins of unspecified lower extremity: Secondary | ICD-10-CM

## 2012-12-23 LAB — POCT INR: INR: 3.7

## 2012-12-23 NOTE — Patient Instructions (Addendum)
INR has increased again   No coumadin today  Then decrease dose to 5 mg daily except for 2.5 mg on Tues, Thurs, and Saturday  Return next week with scheduled appointments at noon for lab, 12:30 with Dr. [name], and 1:30 for coumadin clinic

## 2012-12-23 NOTE — Progress Notes (Signed)
INR continues to increase up to 3.7 today. Patient reports no missed doses or extra doses or any bruising/bleeding. The plan is to decrease her weekly dose to 5 mg daily except for 2.5 mg on Tuesday, Thursday, and Saturday. She will also skip her coumadin dose today (12/23/12). We will plan to see her back in coumadin clinic next week with her scheduled appointments on 12/30/12 with lab at noon, MD appointment with Dr. [name] at 12:30 and then coumadin clinic at 1:30pm

## 2012-12-24 ENCOUNTER — Telehealth: Payer: Self-pay | Admitting: Medical Oncology

## 2012-12-24 NOTE — Telephone Encounter (Signed)
Pt started Pomalyst this week on wed.12/22/12. She also reports that last night pt woke up itching hands, hair , scalp, and " private parts".

## 2012-12-24 NOTE — Telephone Encounter (Signed)
I spoke to pt who reported that itching is better on head , hands still itch a little. I called daughter and instructed [name] to take Benadryl 25 mg for itching per package instructions and to call lif symptoms persist or worsen.

## 2012-12-28 ENCOUNTER — Other Ambulatory Visit: Payer: Self-pay | Admitting: *Deleted

## 2012-12-28 DIAGNOSIS — C9000 Multiple myeloma not having achieved remission: Secondary | ICD-10-CM

## 2012-12-28 MED ORDER — DEXAMETHASONE 4 MG PO TABS: ORAL_TABLET | ORAL | Status: DC

## 2012-12-30 ENCOUNTER — Ambulatory Visit: Payer: Medicare Other

## 2012-12-30 ENCOUNTER — Other Ambulatory Visit: Payer: Self-pay | Admitting: Physician Assistant

## 2012-12-30 ENCOUNTER — Ambulatory Visit: Payer: Medicare Other | Admitting: Pharmacist

## 2012-12-30 ENCOUNTER — Ambulatory Visit (HOSPITAL_BASED_OUTPATIENT_CLINIC_OR_DEPARTMENT_OTHER): Payer: Medicare Other | Admitting: Internal Medicine

## 2012-12-30 ENCOUNTER — Encounter: Payer: Self-pay | Admitting: Internal Medicine

## 2012-12-30 ENCOUNTER — Other Ambulatory Visit: Payer: Self-pay | Admitting: Internal Medicine

## 2012-12-30 ENCOUNTER — Other Ambulatory Visit (HOSPITAL_BASED_OUTPATIENT_CLINIC_OR_DEPARTMENT_OTHER): Payer: Medicare Other

## 2012-12-30 VITALS — BP 125/69 | HR 79 | Temp 97.2°F | Resp 20 | Ht 60.0 in | Wt 134.3 lb

## 2012-12-30 DIAGNOSIS — C9000 Multiple myeloma not having achieved remission: Secondary | ICD-10-CM

## 2012-12-30 DIAGNOSIS — I82409 Acute embolism and thrombosis of unspecified deep veins of unspecified lower extremity: Secondary | ICD-10-CM

## 2012-12-30 DIAGNOSIS — Z86718 Personal history of other venous thrombosis and embolism: Secondary | ICD-10-CM

## 2012-12-30 DIAGNOSIS — Z7901 Long term (current) use of anticoagulants: Secondary | ICD-10-CM

## 2012-12-30 LAB — CBC WITH DIFFERENTIAL/PLATELET
BASO%: 0.1 % (ref 0.0–2.0)
Basophils Absolute: 0 10e3/uL (ref 0.0–0.1)
EOS%: 2.3 % (ref 0.0–7.0)
Eosinophils Absolute: 0.2 10e3/uL (ref 0.0–0.5)
HCT: 34.6 % — ABNORMAL LOW (ref 34.8–46.6)
HGB: 11.4 g/dL — ABNORMAL LOW (ref 11.6–15.9)
LYMPH%: 5.3 % — ABNORMAL LOW (ref 14.0–49.7)
MCH: 30.2 pg (ref 25.1–34.0)
MCHC: 33.1 g/dL (ref 31.5–36.0)
MCV: 91.2 fL (ref 79.5–101.0)
MONO#: 0.5 10e3/uL (ref 0.1–0.9)
MONO%: 6.8 % (ref 0.0–14.0)
NEUT#: 6.7 10e3/uL — ABNORMAL HIGH (ref 1.5–6.5)
NEUT%: 85.5 % — ABNORMAL HIGH (ref 38.4–76.8)
Platelets: 182 10e3/uL (ref 145–400)
RBC: 3.79 10e6/uL (ref 3.70–5.45)
RDW: 14.8 % — ABNORMAL HIGH (ref 11.2–14.5)
WBC: 7.9 10e3/uL (ref 3.9–10.3)
lymph#: 0.4 10e3/uL — ABNORMAL LOW (ref 0.9–3.3)

## 2012-12-30 LAB — PROTIME-INR
INR: 3.9 — ABNORMAL HIGH (ref 2.00–3.50)
Protime: 46.8 s — ABNORMAL HIGH (ref 10.6–13.4)

## 2012-12-30 LAB — COMPREHENSIVE METABOLIC PANEL (CC13)
Albumin: 2.9 g/dL — ABNORMAL LOW (ref 3.5–5.0)
Alkaline Phosphatase: 55 U/L (ref 40–150)
BUN: 17.1 mg/dL (ref 7.0–26.0)
CO2: 31 mEq/L — ABNORMAL HIGH (ref 22–29)
Calcium: 9.9 mg/dL (ref 8.4–10.4)
Chloride: 103 mEq/L (ref 98–107)
Glucose: 104 mg/dl — ABNORMAL HIGH (ref 70–99)
Potassium: 3.7 mEq/L (ref 3.5–5.1)
Total Protein: 6.2 g/dL — ABNORMAL LOW (ref 6.4–8.3)

## 2012-12-30 MED ORDER — PROCHLORPERAZINE MALEATE 10 MG PO TABS: 10.0000 mg | ORAL_TABLET | Freq: Four times a day (QID) | ORAL | Status: DC | PRN

## 2012-12-30 MED ORDER — ALPRAZOLAM 0.5 MG PO TABS: ORAL_TABLET | ORAL | Status: DC

## 2012-12-30 MED ORDER — ACYCLOVIR 400 MG PO TABS: 400.0000 mg | ORAL_TABLET | Freq: Two times a day (BID) | ORAL | Status: DC

## 2012-12-30 MED ORDER — LIDOCAINE 5 % EX PTCH: 1.0000 | MEDICATED_PATCH | CUTANEOUS | Status: DC

## 2012-12-30 NOTE — Progress Notes (Signed)
[facility] Telephone:(336) [phone]   Fax:(336) [phone]  OFFICE PROGRESS NOTE  [name], MD [address] [place] [address]  DIAGNOSIS:  1. multiple myeloma diagnosed in September 2009,  2. history of bilateral lower extremity deep vein thrombosis diagnosed in November 2009  3. acute on chronic deep vein thrombosis in the left lower extremity diagnosed in October 2010.   PRIOR THERAPY:  1. status post palliative radiotherapy to the right femur and is she'll tuberosity, first was done in December 2011 and the second treatment was completed Jan 08 2011, and the third treatment to the right distal femur completed on 04/30/2011  2. status post palliative radiotherapy to the left proximal femur completed 02/28/2011.  3. Revlimid 25 mg by mouth daily for 21 days every 4 weeks in addition to oral Decadron at 40 mg by mouth on a weekly basis status post 40 cycles.  4. weekly subcutaneous Velcade 1.3 mg/M2 status post 60 cycles, discontinued today secondary to mild disease progression.   CURRENT THERAPY:  1. Pomalyst 4 mg by mouth daily for 21 days every 3 weeks, the patient started the first dose on 12/22/2012 , in addition to Decadron 40 mg weekly.  2. Coumadin 5 mg on Mondays and Thursdays and 2.5 mg on all other days.  3. Zometa 4 mg IV given every 2 months.   INTERVAL HISTORY: Eniya Cannady 77 y.o. female returns to the clinic today for follow up visit accompanied by her 2 daughters. The patient has no complaints today except for mild fatigue as well as the neuropathy in the lower extremities. She was started on treatment with Pomalyst 9 days ago. She is tolerating it fairly well with no significant adverse effects. She denied having any nausea or vomiting. She denied having any chest pain, shortness breath, cough or hemoptysis. The patient is requesting a refill of several per medications today.  MEDICAL HISTORY: Past Medical History  Diagnosis Date  .  Blood transfusion   . Depression     Mild  . History of chicken pox   . Hyperlipidemia   . Hypertension   . Degenerative joint disease   . Diabetes mellitus     Steroid related  . Phlebitis   . Multiple myeloma(203.0)     per Dr. [name], s/p palliative radiation for leg pain 2011 per Dr. Roselind Messier  . Deep vein thrombosis of bilateral lower extremities     ALLERGIES:  has No Known Allergies.  MEDICATIONS:  Current Outpatient Prescriptions  Medication Sig Dispense Refill  . acyclovir (ZOVIRAX) 400 MG tablet Take 1 tablet (400 mg total) by mouth 2 (two) times daily.  60 tablet  0  . ALPRAZolam (XANAX) 0.5 MG tablet TAKE ONE TABLET BY MOUTH THREE TIMES DAILY AS NEEDED FOR SLEEP  60 tablet  0  . Calcium Carbonate-Vitamin D 600-400 MG-UNIT per tablet Take 1 tablet by mouth 3 (three) times daily with meals.        Marland Kitchen dexamethasone (DECADRON) 4 MG tablet TAKE TEN TABLETS BY MOUTH ONCE A WEEK  40 tablet  1  . gabapentin (NEURONTIN) 100 MG capsule 100 mg. Takes 3  ( 300 mg) by mouth TID      . glipiZIDE (GLUCOTROL XL) 2.5 MG 24 hr tablet Take 1 tablet (2.5 mg total) by mouth daily.  90 tablet  0  . glucose blood (ONE TOUCH ULTRA TEST) test strip Use as instructed  25 each  6  .  ibuprofen (ADVIL,MOTRIN) 200 MG tablet Take 200 mg by mouth every 6 (six) hours as needed.        . Lancets (ONETOUCH ULTRASOFT) lancets Test blood sugar once daily or as directed.  100 each  11  . lidocaine (LIDODERM) 5 % Place 1 patch onto the skin daily. Remove & Discard patch within 12 hours or as directed by MD  30 patch  0  . lisinopril-hydrochlorothiazide (PRINZIDE,ZESTORETIC) 20-12.5 MG per tablet Take 0.5 tablets by mouth daily.  45 tablet  3  . magnesium oxide (MAG-OX) 400 MG tablet Take 400 mg by mouth daily.       Marland Kitchen oxyCODONE (OXY IR/ROXICODONE) 5 MG immediate release tablet Take 1 1/2 tabs by mouth every 6 hours as needed for pain  90 tablet  0  . pomalidomide (POMALYST) 4 MG capsule Take 1 capsule (4 mg  total) by mouth daily. Take with water on days 1-21. Repeat every 28 days.    0  . potassium chloride SA (K-DUR,KLOR-CON) 20 MEQ tablet Take 1 tablet (20 mEq total) by mouth daily.  7 tablet  0  . temazepam (RESTORIL) 30 MG capsule TAKE ONE CAPSULE BY MOUTH AT BEDTIME  30 capsule  0  . warfarin (COUMADIN) 5 MG tablet TAKE ONE TABLET BY MOUTH ONCE DAILY ON TUESDAY, THURSDAY AND SATURDAY.  TAKE 1/2 TABLET ON ALL OTHER DAYS OR AS DIRECTED  60 tablet  0  . oxyCODONE (OXYCONTIN) 10 MG 12 hr tablet Take 1 tablet (10 mg total) by mouth every 12 (twelve) hours.  60 tablet  0  . prochlorperazine (COMPAZINE) 10 MG tablet Take 1 tablet (10 mg total) by mouth every 6 (six) hours as needed.  30 tablet  1   No current facility-administered medications for this visit.   Facility-Administered Medications Ordered in Other Visits  Medication Dose Route Frequency Provider Last Rate Last Dose  . Zoledronic Acid (ZOMETA) 4 mg IVPB  4 mg Intravenous Once Si Gaul, MD        SURGICAL HISTORY:  Past Surgical History  Procedure Laterality Date  . Abdominal hysterectomy      REVIEW OF SYSTEMS:  A comprehensive review of systems was negative except for: Constitutional: positive for fatigue   PHYSICAL EXAMINATION: General appearance: alert, cooperative and no distress Head: Normocephalic, without obvious abnormality, atraumatic Lymph nodes: Cervical, supraclavicular, and axillary nodes normal. Resp: clear to auscultation bilaterally Cardio: regular rate and rhythm, S1, S2 normal, no murmur, click, rub or gallop GI: soft, non-tender; bowel sounds normal; no masses,  no organomegaly Extremities: extremities normal, atraumatic, no cyanosis or edema  ECOG PERFORMANCE STATUS: 1 - Symptomatic but completely ambulatory  Blood pressure 125/69, pulse 79, temperature 97.2 F (36.2 C), temperature source Oral, resp. rate 20, height 5' (1.524 m), weight 134 lb 4.8 oz (60.918 kg).  LABORATORY DATA: Lab Results    Component Value Date   WBC 7.9 12/30/2012   HGB 11.4* 12/30/2012   HCT 34.6* 12/30/2012   MCV 91.2 12/30/2012   PLT 182 12/30/2012      Chemistry      Component Value Date/Time   NA 141 12/02/2012 1134   NA 141 06/24/2012 1620   K 3.9 12/02/2012 1134   K 3.1* 06/24/2012 1620   CL 104 12/02/2012 1134   CL 101 06/24/2012 1620   CO2 28 12/02/2012 1134   CO2 32 06/24/2012 1620   BUN 17.3 12/02/2012 1134   BUN 15 06/24/2012 1620   CREATININE 1.0 12/02/2012 1134  CREATININE 0.96 06/24/2012 1620      Component Value Date/Time   CALCIUM 9.5 12/02/2012 1134   CALCIUM 9.4 06/24/2012 1620   CALCIUM  Value: 5.7 CRITICAL RESULT CALLED TO, READ BACK BY AND VERIFIED WITH: JAMIE TRACY,RN [id number] @ 1433 BY J SCOTTON (NOTE)  Amended report. Result repeated and verified. CORRECTED ON 10/14 AT 1429: PREVIOUSLY REPORTED AS Result repeated and verified.* 06/13/2008 1605   ALKPHOS 61 12/02/2012 1134   ALKPHOS 50 06/24/2012 1620   AST 20 12/02/2012 1134   AST 19 06/24/2012 1620   ALT 14 12/02/2012 1134   ALT 15 06/24/2012 1620   BILITOT 0.36 12/02/2012 1134   BILITOT 0.2* 06/24/2012 1620       RADIOGRAPHIC STUDIES: No results found.  ASSESSMENT: this is a very pleasant 77 years old [place] American female with multiple myeloma diagnosed in September of 2009 and has been on several treatment regimens and recently started on treatment with Pomalyst and Decadron.  PLAN: the patient is tolerating her treatment fairly well with no significant adverse effects. I recommended for her to continue treatment was Pomalyst and Decadron at the same dose. She would come back for follow up visit in 3 weeks. The patient was given a refill today for lidocaine patch, Xanax, Compazine and acyclovir. She was advised to call me immediately if she has any concerning symptoms in the interval. She will continue on Coumadin for her history of recurrent deep venous thrombosis of the left lower extremity.  All questions were answered. The patient  knows to call the clinic with any problems, questions or concerns. We can certainly see the patient much sooner if necessary.

## 2012-12-30 NOTE — Patient Instructions (Signed)
Continue treatment with Pomalyst and Decadron Followup visit in 3 weeks

## 2012-12-30 NOTE — Progress Notes (Signed)
PT seen by MD today as well.  She is now on Pomalyst (started 12/21/12) and no longer getting velcade. INR=3.9 on 5mg  SuMWF and 2.5 mg TThSa. Daughter stated she was unaware of the Tue dose at 2.5 mg.   No coumadin today then change Coumadin to 5 mg daily except 2.5 mg on Tues, Thurs, & Sat. Recheck PT/INR on 01/06/13 for lab at noon and  coumadin at 12:15pm

## 2013-01-03 ENCOUNTER — Ambulatory Visit: Payer: Medicare Other | Admitting: Internal Medicine

## 2013-01-04 ENCOUNTER — Telehealth: Payer: Self-pay | Admitting: Internal Medicine

## 2013-01-04 ENCOUNTER — Other Ambulatory Visit: Payer: Self-pay | Admitting: *Deleted

## 2013-01-04 DIAGNOSIS — C9000 Multiple myeloma not having achieved remission: Secondary | ICD-10-CM

## 2013-01-04 MED ORDER — OXYCODONE HCL 5 MG PO TABS: ORAL_TABLET | ORAL | Status: DC

## 2013-01-06 ENCOUNTER — Ambulatory Visit (HOSPITAL_BASED_OUTPATIENT_CLINIC_OR_DEPARTMENT_OTHER): Payer: Medicare Other | Admitting: Pharmacist

## 2013-01-06 ENCOUNTER — Other Ambulatory Visit: Payer: Self-pay | Admitting: Medical Oncology

## 2013-01-06 ENCOUNTER — Telehealth: Payer: Self-pay | Admitting: Internal Medicine

## 2013-01-06 ENCOUNTER — Ambulatory Visit: Payer: Medicare Other

## 2013-01-06 ENCOUNTER — Other Ambulatory Visit: Payer: Medicare Other

## 2013-01-06 DIAGNOSIS — I82409 Acute embolism and thrombosis of unspecified deep veins of unspecified lower extremity: Secondary | ICD-10-CM

## 2013-01-06 DIAGNOSIS — I82401 Acute embolism and thrombosis of unspecified deep veins of right lower extremity: Secondary | ICD-10-CM

## 2013-01-06 DIAGNOSIS — C9000 Multiple myeloma not having achieved remission: Secondary | ICD-10-CM

## 2013-01-06 LAB — POCT INR: INR: 2.6

## 2013-01-06 MED ORDER — OXYCODONE HCL 10 MG PO TB12: 10.0000 mg | ORAL_TABLET | Freq: Two times a day (BID) | ORAL | Status: DC

## 2013-01-06 NOTE — Progress Notes (Signed)
INR within goal. No problems to report regarding anticoagulation. Pt has noticed some "tickling" at various times in her left hand. This also happens in her right heal. Pt will let Dr. [name] know if it continues or becomes more severe.  Continue Coumadin 5 mg daily except 2.5 mg on Tues, Thurs, &Sat. Recheck PT/INR on 01/17/13: Lab at 2:30pm, coumadin clinic at 2:45pm and Dr. [name] at 3:15pm.

## 2013-01-06 NOTE — Telephone Encounter (Signed)
Pt's daughter came by to get calendar, pt will see MD on 5/19 but no labs, checked with Dewayne Hatch.M to clarify regarding pt labs for the visit , she asked me to clarify with nurse

## 2013-01-06 NOTE — Telephone Encounter (Signed)
Requests refill authorized per Tiana Loft and locked in injection room.

## 2013-01-06 NOTE — Patient Instructions (Addendum)
Continue Coumadin 5 mg daily except 2.5 mg on Tues, Thurs, & Sat. Recheck PT/INR on 01/17/13: Lab at 2:30pm, coumadin clinic at 2:45pm and Dr. [name] at 3:15pm.

## 2013-01-07 ENCOUNTER — Ambulatory Visit
Admission: RE | Admit: 2013-01-07 | Discharge: 2013-01-07 | Disposition: A | Discharge status: Home / Self Care | Payer: Medicare Other | Source: Ambulatory Visit

## 2013-01-07 DIAGNOSIS — Z1231 Encounter for screening mammogram for malignant neoplasm of breast: Secondary | ICD-10-CM

## 2013-01-10 ENCOUNTER — Encounter: Payer: Self-pay | Admitting: *Deleted

## 2013-01-11 ENCOUNTER — Other Ambulatory Visit: Payer: Self-pay | Admitting: *Deleted

## 2013-01-11 DIAGNOSIS — C9000 Multiple myeloma not having achieved remission: Secondary | ICD-10-CM

## 2013-01-11 MED ORDER — POMALIDOMIDE 4 MG PO CAPS: 4.0000 mg | ORAL_CAPSULE | Freq: Every day | ORAL | Status: DC

## 2013-01-12 NOTE — Telephone Encounter (Signed)
Rec'd fax verification that referral for Pomalyst received and will be in touch with patient to schedule delivery.

## 2013-01-13 ENCOUNTER — Ambulatory Visit: Payer: Medicare Other

## 2013-01-13 ENCOUNTER — Telehealth: Payer: Self-pay | Admitting: Medical Oncology

## 2013-01-13 ENCOUNTER — Other Ambulatory Visit: Payer: Medicare Other | Admitting: Lab

## 2013-01-13 NOTE — Telephone Encounter (Signed)
Confirmed appts . 

## 2013-01-14 ENCOUNTER — Telehealth: Payer: Self-pay | Admitting: Internal Medicine

## 2013-01-14 NOTE — Telephone Encounter (Signed)
pt daughter called to get appt d/t....returned call and the # stated to call back is not a working #

## 2013-01-17 ENCOUNTER — Other Ambulatory Visit (HOSPITAL_BASED_OUTPATIENT_CLINIC_OR_DEPARTMENT_OTHER): Payer: Medicare Other | Admitting: Lab

## 2013-01-17 ENCOUNTER — Ambulatory Visit: Payer: Medicare Other | Admitting: Pharmacist

## 2013-01-17 ENCOUNTER — Ambulatory Visit (HOSPITAL_BASED_OUTPATIENT_CLINIC_OR_DEPARTMENT_OTHER): Payer: Medicare Other | Admitting: Internal Medicine

## 2013-01-17 ENCOUNTER — Encounter: Payer: Self-pay | Admitting: Internal Medicine

## 2013-01-17 ENCOUNTER — Telehealth: Payer: Self-pay | Admitting: Internal Medicine

## 2013-01-17 VITALS — BP 158/78 | HR 60 | Temp 97.7°F | Resp 18 | Ht 60.0 in | Wt 133.9 lb

## 2013-01-17 DIAGNOSIS — I82409 Acute embolism and thrombosis of unspecified deep veins of unspecified lower extremity: Secondary | ICD-10-CM

## 2013-01-17 DIAGNOSIS — C9000 Multiple myeloma not having achieved remission: Secondary | ICD-10-CM

## 2013-01-17 LAB — CBC WITH DIFFERENTIAL/PLATELET
Basophils Absolute: 0.1 10*3/uL (ref 0.0–0.1)
Eosinophils Absolute: 0 10*3/uL (ref 0.0–0.5)
HGB: 12.1 g/dL (ref 11.6–15.9)
MCV: 91.1 fL (ref 79.5–101.0)
MONO%: 19.1 % — ABNORMAL HIGH (ref 0.0–14.0)
NEUT#: 2.2 10*3/uL (ref 1.5–6.5)
RBC: 3.99 10*6/uL (ref 3.70–5.45)
RDW: 14.3 % (ref 11.2–14.5)
WBC: 3.6 10*3/uL — ABNORMAL LOW (ref 3.9–10.3)
lymph#: 0.6 10*3/uL — ABNORMAL LOW (ref 0.9–3.3)

## 2013-01-17 LAB — COMPREHENSIVE METABOLIC PANEL (CC13)
AST: 18 U/L (ref 5–34)
Albumin: 3 g/dL — ABNORMAL LOW (ref 3.5–5.0)
Alkaline Phosphatase: 55 U/L (ref 40–150)
BUN: 19.1 mg/dL (ref 7.0–26.0)
Calcium: 9.1 mg/dL (ref 8.4–10.4)
Chloride: 106 mEq/L (ref 98–107)
Glucose: 132 mg/dl — ABNORMAL HIGH (ref 70–99)
Potassium: 3.3 mEq/L — ABNORMAL LOW (ref 3.5–5.1)
Sodium: 144 mEq/L (ref 136–145)
Total Protein: 6.3 g/dL — ABNORMAL LOW (ref 6.4–8.3)

## 2013-01-17 NOTE — Patient Instructions (Signed)
Continue treatment with Pomalyst and Decadron. Follow up visit in one month 

## 2013-01-17 NOTE — Patient Instructions (Addendum)
Continue Coumadin 5 mg daily except 2.5 mg on Tues, Thurs, &Sat. Recheck PT/INR on 01/27/13: Lab at 12:00pm, infusion at 12:30 and we will see in infusion area.

## 2013-01-17 NOTE — Progress Notes (Signed)
[facility] [phone]   [phone]  OFFICE PROGRESS NOTE  [name], MD [address] [place] [address]  DIAGNOSIS:  1. multiple myeloma diagnosed in September 2009,  2. history of bilateral lower extremity deep vein thrombosis diagnosed in November 2009  3. acute on chronic deep vein thrombosis in the left lower extremity diagnosed in October 2010.   PRIOR THERAPY:  1. status post palliative radiotherapy to the right femur and is she'll tuberosity, first was done in December 2011 and the second treatment was completed Jan 08 2011, and the third treatment to the right distal femur completed on 04/30/2011  2. status post palliative radiotherapy to the left proximal femur completed 02/28/2011.  3. Revlimid 25 mg by mouth daily for 21 days every 4 weeks in addition to oral Decadron at 40 mg by mouth on a weekly basis status post 40 cycles.  4. weekly subcutaneous Velcade 1.3 mg/M2 status post 60 cycles, discontinued today secondary to mild disease progression.   CURRENT THERAPY:  1. Pomalyst 4 mg by mouth daily for 21 days every 3 weeks, the patient started the first dose on 12/22/2012 , in addition to Decadron 40 mg weekly. Second cycle was Pomalyst expected to start on 01/18/2013 2. Coumadin 5 mg on Mondays and Thursdays and 2.5 mg on all other days.  3. Zometa 4 mg IV given every 2 months.   INTERVAL HISTORY: [name] 77 y.o. female returns to the clinic today for followup visit accompanied by her 2 daughters. The patient is feeling fine today with no specific complaints. She completed 1 cycle of chemotherapy was Pomalyst and Decadron. She has no significant adverse effects specifically no fever or chills. No nausea or vomiting. She has no chest pain, shortness of breath, cough or hemoptysis. The patient fell down at home few days ago and has a tender area at the left buttock. She is able to ambulate with no significant  limitation.   MEDICAL HISTORY: Past Medical History  Diagnosis Date  . Blood transfusion   . Depression     Mild  . History of chicken pox   . Hyperlipidemia   . Hypertension   . Degenerative joint disease   . Diabetes mellitus     Steroid related  . Phlebitis   . Multiple myeloma(203.0)     per Dr. [name], s/p palliative radiation for leg pain 2011 per Dr. Roselind Messier  . Deep vein thrombosis of bilateral lower extremities     ALLERGIES:  has No Known Allergies.  MEDICATIONS:  Current Outpatient Prescriptions  Medication Sig Dispense Refill  . acyclovir (ZOVIRAX) 400 MG tablet TAKE ONE TABLET BY MOUTH TWICE DAILY  60 tablet  0  . ALPRAZolam (XANAX) 0.5 MG tablet TAKE ONE TABLET BY MOUTH THREE TIMES DAILY AS NEEDED. MAY ALSO USE FOR SLEEP.  60 tablet  0  . Calcium Carbonate-Vitamin D 600-400 MG-UNIT per tablet Take 1 tablet by mouth 3 (three) times daily with meals.        Marland Kitchen dexamethasone (DECADRON) 4 MG tablet TAKE TEN TABLETS BY MOUTH ONCE A WEEK  40 tablet  1  . gabapentin (NEURONTIN) 100 MG capsule 100 mg. Takes 3  ( 300 mg) by mouth TID      . glipiZIDE (GLUCOTROL XL) 2.5 MG 24 hr tablet Take 1 tablet (2.5 mg total) by mouth daily.  90 tablet  0  . glucose blood (ONE TOUCH ULTRA TEST) test strip  Use as instructed  25 each  6  . ibuprofen (ADVIL,MOTRIN) 200 MG tablet Take 200 mg by mouth every 6 (six) hours as needed.        . Lancets (ONETOUCH ULTRASOFT) lancets Test blood sugar once daily or as directed.  100 each  11  . lidocaine (LIDODERM) 5 % Place 1 patch onto the skin daily. Remove & Discard patch within 12 hours or as directed by MD  30 patch  0  . lisinopril-hydrochlorothiazide (PRINZIDE,ZESTORETIC) 20-12.5 MG per tablet Take 0.5 tablets by mouth daily.  45 tablet  3  . magnesium oxide (MAG-OX) 400 MG tablet Take 400 mg by mouth daily.       Marland Kitchen oxyCODONE (OXY IR/ROXICODONE) 5 MG immediate release tablet Take 1 1/2 tabs by mouth every 6 hours as needed for pain  90 tablet   0  . oxyCODONE (OXYCONTIN) 10 MG 12 hr tablet Take 1 tablet (10 mg total) by mouth every 12 (twelve) hours.  60 tablet  0  . pomalidomide (POMALYST) 4 MG capsule Take 1 capsule (4 mg total) by mouth daily. Take with water on days 1-21. Repeat every 28 days.  21 capsule  0  . potassium chloride SA (K-DUR,KLOR-CON) 20 MEQ tablet Take 1 tablet (20 mEq total) by mouth daily.  7 tablet  0  . prochlorperazine (COMPAZINE) 10 MG tablet Take 1 tablet (10 mg total) by mouth every 6 (six) hours as needed.  30 tablet  1  . temazepam (RESTORIL) 30 MG capsule TAKE ONE CAPSULE BY MOUTH AT BEDTIME  30 capsule  0  . warfarin (COUMADIN) 5 MG tablet TAKE ONE TABLET BY MOUTH ONCE DAILY ON TUESDAY, THURSDAY AND SATURDAY.  TAKE 1/2 TABLET ON ALL OTHER DAYS OR AS DIRECTED  60 tablet  0   No current facility-administered medications for this visit.   Facility-Administered Medications Ordered in Other Visits  Medication Dose Route Frequency Provider Last Rate Last Dose  . Zoledronic Acid (ZOMETA) 4 mg IVPB  4 mg Intravenous Once Si Gaul, MD        SURGICAL HISTORY:  Past Surgical History  Procedure Laterality Date  . Abdominal hysterectomy      REVIEW OF SYSTEMS:  A comprehensive review of systems was negative except for: Constitutional: positive for fatigue   PHYSICAL EXAMINATION: General appearance: alert, cooperative and no distress Head: Normocephalic, without obvious abnormality, atraumatic Neck: no adenopathy Lymph nodes: Cervical, supraclavicular, and axillary nodes normal. Resp: clear to auscultation bilaterally Cardio: regular rate and rhythm, S1, S2 normal, no murmur, click, rub or gallop GI: soft, non-tender; bowel sounds normal; no masses,  no organomegaly Extremities: extremities normal, atraumatic, no cyanosis or edema  ECOG PERFORMANCE STATUS: 1 - Symptomatic but completely ambulatory  Blood pressure 158/78, pulse 60, temperature 97.7 F (36.5 C), temperature source Oral, resp. rate  18, height 5' (1.524 m), weight 133 lb 14.4 oz (60.737 kg).  LABORATORY DATA: Lab Results  Component Value Date   WBC 3.6* 01/17/2013   HGB 12.1 01/17/2013   HCT 36.3 01/17/2013   MCV 91.1 01/17/2013   PLT 341 01/17/2013      Chemistry      Component Value Date/Time   NA 141 12/30/2012 1208   NA 141 06/24/2012 1620   K 3.7 12/30/2012 1208   K 3.1* 06/24/2012 1620   CL 103 12/30/2012 1208   CL 101 06/24/2012 1620   CO2 31* 12/30/2012 1208   CO2 32 06/24/2012 1620   BUN 17.1 12/30/2012  1208   BUN 15 06/24/2012 1620   CREATININE 1.3* 12/30/2012 1208   CREATININE 0.96 06/24/2012 1620      Component Value Date/Time   CALCIUM 9.9 12/30/2012 1208   CALCIUM 9.4 06/24/2012 1620   CALCIUM  Value: 5.7 CRITICAL RESULT CALLED TO, READ BACK BY AND VERIFIED WITH: JAMIE TRACY,RN [id number] @ 1433 BY J SCOTTON (NOTE)  Amended report. Result repeated and verified. CORRECTED ON 10/14 AT 1429: PREVIOUSLY REPORTED AS Result repeated and verified.* 06/13/2008 1605   ALKPHOS 55 12/30/2012 1208   ALKPHOS 50 06/24/2012 1620   AST 16 12/30/2012 1208   AST 19 06/24/2012 1620   ALT 15 12/30/2012 1208   ALT 15 06/24/2012 1620   BILITOT 0.27 12/30/2012 1208   BILITOT 0.2* 06/24/2012 1620       RADIOGRAPHIC STUDIES: Mm Digital Screening  01/07/2013   *RADIOLOGY REPORT*  Clinical Data: Screening.  DIGITAL BILATERAL SCREENING MAMMOGRAM WITH CAD  Comparison:  Previous exams.  FINDINGS:  ACR Breast Density Category 2: There is a scattered fibroglandular pattern.  No suspicious masses, architectural distortion, or calcifications are present.  Images were processed with CAD.  IMPRESSION: No mammographic evidence of malignancy.  A result letter of this screening mammogram will be mailed directly to the patient.  RECOMMENDATION: Screening mammogram in one year. (Code:SM-B-01Y)  BI-RADS CATEGORY 1:  Negative.   Original Report Authenticated By: Esperanza Heir, M.D.    ASSESSMENT: This is a very pleasant 77 years old [place] American  female with history of multiple myeloma diagnosed in September of 2009 currently on treatment with Pomalyst and Decadron status post 1 cycle   PLAN: The patient is tolerating her treatment fairly well with no significant adverse effects. I recommended for her to proceed with cycle #2 tomorrow as scheduled. She would come back for followup visit in one month for reevaluation. She was advised to call immediately if she has any concerning symptoms in the interval.  All questions were answered. The patient knows to call the clinic with any problems, questions or concerns. We can certainly see the patient much sooner if necessary.

## 2013-01-17 NOTE — Progress Notes (Signed)
Pt seen in clinic today with nieces. Pt states she had a fall this weekend as she tripped over the vacuum cord.  Has not had to add any additional pain meds Still taking Pomalyst, no complaints. INR=2.5 today Continue Coumadin 5 mg daily except 2.5 mg on Tues, Thurs, &Sat. Recheck PT/INR on 01/27/13: Lab at 12:00pm, infusion at 12:30 and we will see in infusion area.

## 2013-01-17 NOTE — Telephone Encounter (Signed)
gv and printed appt sched and avs for pt  °

## 2013-01-17 NOTE — Progress Notes (Signed)
Quick Note:  Call patient with the result and encourage K rich diet ______ 

## 2013-01-17 NOTE — Telephone Encounter (Signed)
RECEIVED A FAX FROM [facility] CONCERNING A CONFIRMATION OF PRESCRIPTION SHIPMENT FOR POMALYST ON 01/14/13.

## 2013-01-18 ENCOUNTER — Telehealth: Payer: Self-pay | Admitting: Medical Oncology

## 2013-01-18 NOTE — Telephone Encounter (Signed)
Message copied by Charma Igo on Tue Jan 18, 2013  3:41 PM ------      Message from: Si Gaul      Created: Mon Jan 17, 2013  4:15 PM       Call patient with the result and encourage K rich diet. ------

## 2013-01-18 NOTE — Telephone Encounter (Signed)
Instructed [name]  to have increase potassium in her foods and gave examples . I mailed food list.

## 2013-01-20 ENCOUNTER — Ambulatory Visit: Payer: Medicare Other

## 2013-01-20 ENCOUNTER — Other Ambulatory Visit: Payer: Medicare Other | Admitting: Lab

## 2013-01-26 ENCOUNTER — Other Ambulatory Visit: Payer: Self-pay | Admitting: Medical Oncology

## 2013-01-26 ENCOUNTER — Other Ambulatory Visit: Payer: Self-pay | Admitting: Internal Medicine

## 2013-01-26 ENCOUNTER — Other Ambulatory Visit: Payer: Self-pay | Admitting: Physician Assistant

## 2013-01-26 DIAGNOSIS — C9000 Multiple myeloma not having achieved remission: Secondary | ICD-10-CM

## 2013-01-26 MED ORDER — OXYCODONE HCL 5 MG PO TABS: ORAL_TABLET | ORAL | Status: DC

## 2013-01-26 NOTE — Telephone Encounter (Signed)
Refill auth sent to [facility] and locked in injection room for pt pickup

## 2013-01-27 ENCOUNTER — Other Ambulatory Visit (HOSPITAL_BASED_OUTPATIENT_CLINIC_OR_DEPARTMENT_OTHER): Payer: Medicare Other | Admitting: Lab

## 2013-01-27 ENCOUNTER — Other Ambulatory Visit: Payer: Self-pay | Admitting: Internal Medicine

## 2013-01-27 ENCOUNTER — Ambulatory Visit (HOSPITAL_BASED_OUTPATIENT_CLINIC_OR_DEPARTMENT_OTHER): Payer: Medicare Other

## 2013-01-27 ENCOUNTER — Other Ambulatory Visit: Payer: Self-pay | Admitting: Physician Assistant

## 2013-01-27 ENCOUNTER — Ambulatory Visit: Payer: Medicare Other | Admitting: Pharmacist

## 2013-01-27 VITALS — BP 167/57 | HR 57 | Temp 97.2°F | Resp 18

## 2013-01-27 DIAGNOSIS — C9000 Multiple myeloma not having achieved remission: Secondary | ICD-10-CM

## 2013-01-27 DIAGNOSIS — I82409 Acute embolism and thrombosis of unspecified deep veins of unspecified lower extremity: Secondary | ICD-10-CM

## 2013-01-27 LAB — CBC WITH DIFFERENTIAL/PLATELET
BASO%: 0.7 % (ref 0.0–2.0)
Eosinophils Absolute: 0.2 10*3/uL (ref 0.0–0.5)
MONO#: 0.3 10*3/uL (ref 0.1–0.9)
NEUT#: 4.8 10*3/uL (ref 1.5–6.5)
RBC: 3.64 10*6/uL — ABNORMAL LOW (ref 3.70–5.45)
RDW: 15.5 % — ABNORMAL HIGH (ref 11.2–14.5)
WBC: 5.9 10*3/uL (ref 3.9–10.3)
lymph#: 0.6 10*3/uL — ABNORMAL LOW (ref 0.9–3.3)

## 2013-01-27 LAB — COMPREHENSIVE METABOLIC PANEL (CC13)
ALT: 17 U/L (ref 0–55)
Albumin: 2.8 g/dL — ABNORMAL LOW (ref 3.5–5.0)
Alkaline Phosphatase: 56 U/L (ref 40–150)
CO2: 27 mEq/L (ref 22–29)
Glucose: 230 mg/dl — ABNORMAL HIGH (ref 70–99)
Potassium: 3.5 mEq/L (ref 3.5–5.1)
Sodium: 141 mEq/L (ref 136–145)
Total Protein: 6 g/dL — ABNORMAL LOW (ref 6.4–8.3)

## 2013-01-27 LAB — PROTIME-INR
INR: 3.1 (ref 2.00–3.50)
Protime: 37.2 Seconds — ABNORMAL HIGH (ref 10.6–13.4)

## 2013-01-27 LAB — POCT INR: INR: 3.1

## 2013-01-27 MED ORDER — ZOLEDRONIC ACID 4 MG/100ML IV SOLN
4.0000 mg | Freq: Once | INTRAVENOUS | Status: AC
  Administered 2013-01-27: 4 mg via INTRAVENOUS
  Filled 2013-01-27: qty 100

## 2013-01-27 NOTE — Patient Instructions (Addendum)
Continue Coumadin 5 mg daily except 2.5 mg on Tues, Thurs, &Sat. Recheck PT/INR on 02/14/13: Lab at 1:15pm, coumadin clinic at 1:30 and MD appmt at 1:45.

## 2013-01-27 NOTE — Progress Notes (Signed)
Saw patient in infusion area today. INR=3.1 No changes to report. Continue Coumadin 5 mg daily except 2.5 mg on Tues, Thurs, &Sat. Recheck PT/INR on 02/14/13: Lab at 1:15pm, coumadin clinic at 1:30 and MD appmt at 1:45.

## 2013-02-03 ENCOUNTER — Telehealth: Payer: Self-pay | Admitting: Medical Oncology

## 2013-02-03 NOTE — Telephone Encounter (Signed)
I called [name] about pt appointments and told her they are not as frequent because her treatment has changed. She voices understanding,

## 2013-02-08 ENCOUNTER — Other Ambulatory Visit: Payer: Self-pay | Admitting: Medical Oncology

## 2013-02-08 DIAGNOSIS — C9000 Multiple myeloma not having achieved remission: Secondary | ICD-10-CM

## 2013-02-08 MED ORDER — POMALIDOMIDE 4 MG PO CAPS: 4.0000 mg | ORAL_CAPSULE | Freq: Every day | ORAL | Status: DC

## 2013-02-09 NOTE — Telephone Encounter (Signed)
Faxed Pomalyst refill to [facility]

## 2013-02-10 ENCOUNTER — Other Ambulatory Visit: Payer: Self-pay | Admitting: Medical Oncology

## 2013-02-10 DIAGNOSIS — C9000 Multiple myeloma not having achieved remission: Secondary | ICD-10-CM

## 2013-02-10 MED ORDER — OXYCODONE HCL 10 MG PO TB12: 10.0000 mg | ORAL_TABLET | Freq: Two times a day (BID) | ORAL | Status: DC

## 2013-02-10 NOTE — Telephone Encounter (Signed)
Refill sent to [facility] for authorization and given to [place] B. In the injection room.

## 2013-02-11 NOTE — Telephone Encounter (Signed)
RECEIVED A FAX FROM [facility] CONCERNING A CONFIRMATION OF PRESCRIPTION SHIPMENT FOR POMALYST ON 02/10/13

## 2013-02-14 ENCOUNTER — Telehealth: Payer: Self-pay | Admitting: *Deleted

## 2013-02-14 ENCOUNTER — Encounter: Payer: Self-pay | Admitting: Internal Medicine

## 2013-02-14 ENCOUNTER — Ambulatory Visit (HOSPITAL_BASED_OUTPATIENT_CLINIC_OR_DEPARTMENT_OTHER): Payer: Medicare Other | Admitting: Internal Medicine

## 2013-02-14 ENCOUNTER — Telehealth: Payer: Self-pay | Admitting: Internal Medicine

## 2013-02-14 ENCOUNTER — Ambulatory Visit (HOSPITAL_BASED_OUTPATIENT_CLINIC_OR_DEPARTMENT_OTHER): Payer: Medicare Other | Admitting: Pharmacist

## 2013-02-14 ENCOUNTER — Other Ambulatory Visit (HOSPITAL_BASED_OUTPATIENT_CLINIC_OR_DEPARTMENT_OTHER): Payer: Medicare Other

## 2013-02-14 ENCOUNTER — Other Ambulatory Visit: Payer: Self-pay

## 2013-02-14 VITALS — BP 151/65 | HR 56 | Temp 98.2°F | Resp 18 | Ht 60.0 in | Wt 131.4 lb

## 2013-02-14 DIAGNOSIS — C9000 Multiple myeloma not having achieved remission: Secondary | ICD-10-CM

## 2013-02-14 DIAGNOSIS — Z5181 Encounter for therapeutic drug level monitoring: Secondary | ICD-10-CM

## 2013-02-14 DIAGNOSIS — E119 Type 2 diabetes mellitus without complications: Secondary | ICD-10-CM

## 2013-02-14 DIAGNOSIS — G609 Hereditary and idiopathic neuropathy, unspecified: Secondary | ICD-10-CM

## 2013-02-14 DIAGNOSIS — I82409 Acute embolism and thrombosis of unspecified deep veins of unspecified lower extremity: Secondary | ICD-10-CM

## 2013-02-14 LAB — CBC WITH DIFFERENTIAL/PLATELET
BASO%: 3.7 % — ABNORMAL HIGH (ref 0.0–2.0)
EOS%: 8 % — ABNORMAL HIGH (ref 0.0–7.0)
LYMPH%: 26.9 % (ref 14.0–49.7)
MCH: 29.8 pg (ref 25.1–34.0)
MCHC: 32.2 g/dL (ref 31.5–36.0)
MONO#: 0.5 10*3/uL (ref 0.1–0.9)
Platelets: 314 10*3/uL (ref 145–400)
RBC: 3.99 10*6/uL (ref 3.70–5.45)
WBC: 3 10*3/uL — ABNORMAL LOW (ref 3.9–10.3)
nRBC: 0 % (ref 0–0)

## 2013-02-14 LAB — PROTIME-INR: INR: 4.2 — ABNORMAL HIGH (ref 2.00–3.50)

## 2013-02-14 LAB — COMPREHENSIVE METABOLIC PANEL (CC13)
AST: 11 U/L (ref 5–34)
Alkaline Phosphatase: 57 U/L (ref 40–150)
BUN: 14.2 mg/dL (ref 7.0–26.0)
Creatinine: 1 mg/dL (ref 0.6–1.1)
Total Bilirubin: 0.37 mg/dL (ref 0.20–1.20)

## 2013-02-14 NOTE — Patient Instructions (Signed)
Continue treatment with Pomalyst and Decadron. Followup visit in one month with repeat myeloma panel

## 2013-02-14 NOTE — Progress Notes (Signed)
INR above goal today. Patient and family report no changes to medications or diet, no alcohol use over weekend. No extra doses or missed doses. No issues with bleeding or bruising. Patient states her appetite is fine and diet has been stable and regular. No other reason for INR elevation. She has been stable on current dose but does periodically have 1 reading that is elevated. Instructed patient on plan of No coumadin today then decrease dose to Coumadin 5 mg daily except 2.5 mg on Tues, Thurs, Fri &Sat.  Recheck PT/INR on 03/10/13: Lab at 1:00pm, coumadin clinic at 1:15. Patient knows to call if she has any concerns or if any issues arise

## 2013-02-14 NOTE — Telephone Encounter (Signed)
[name] pts daughter left v/m requesting refill on diabetic med (not sure of name), said [employer] had been attempting to get refill;unable to reach Godfrey by phone. Spoke with Deidra at [facility] [employer] in [place] and had sent refill to wrong dr. Rock Nephew needs Glipizide refill. Pt's 11/2012 A1C cancelled.Please advise.

## 2013-02-14 NOTE — Patient Instructions (Addendum)
INR above goal  Hold coumadin today  No coumadin today then change dose to Coumadin 5 mg daily except 2.5 mg on Tues, Thurs, Fri &Sat.  Recheck PT/INR on 03/10/13: Lab at 1:00pm, coumadin clinic at 1:15

## 2013-02-14 NOTE — Progress Notes (Signed)
[facility] Telephone:(336) [phone]   Fax:(336) [phone]  OFFICE PROGRESS NOTE  [name], MD [address] [place] [address]  DIAGNOSIS:  1. multiple myeloma diagnosed in September 2009,  2. history of bilateral lower extremity deep vein thrombosis diagnosed in November 2009  3. acute on chronic deep vein thrombosis in the left lower extremity diagnosed in October 2010.   PRIOR THERAPY:  1. status post palliative radiotherapy to the right femur and is she'll tuberosity, first was done in December 2011 and the second treatment was completed Jan 08 2011, and the third treatment to the right distal femur completed on 04/30/2011  2. status post palliative radiotherapy to the left proximal femur completed 02/28/2011.  3. Revlimid 25 mg by mouth daily for 21 days every 4 weeks in addition to oral Decadron at 40 mg by mouth on a weekly basis status post 40 cycles.  4. weekly subcutaneous Velcade 1.3 mg/M2 status post 60 cycles, discontinued today secondary to mild disease progression.   CURRENT THERAPY:  1. Pomalyst 4 mg by mouth daily for 21 days every 3 weeks, the patient started the first dose on 12/22/2012 , in addition to Decadron 40 mg weekly. Second cycle was Pomalyst expected to start on 01/18/2013  2. Coumadin 5 mg on Mondays and Thursdays and 2.5 mg on all other days.  3. Zometa 4 mg IV given every 2 months.    INTERVAL HISTORY: [name] 77 y.o. female returns to the clinic today for followup visit accompanied HER-2 daughters. The patient is feeling fine today with no specific complaints except for aching pain in the lower extremities. She is currently on Neurontin 300 mg by mouth 3 times a day in addition to OxyContin and oxycodone for pain management. The patient is tolerating her treatment with Pomalyst and Decadron fairly well. She denied having any significant nausea or vomiting. She denied having any significant weight loss or night  sweats.  MEDICAL HISTORY: Past Medical History  Diagnosis Date  . Blood transfusion   . Depression     Mild  . History of chicken pox   . Hyperlipidemia   . Hypertension   . Degenerative joint disease   . Diabetes mellitus     Steroid related  . Phlebitis   . Multiple myeloma     per Dr. [name], s/p palliative radiation for leg pain 2011 per Dr. Roselind Messier  . Deep vein thrombosis of bilateral lower extremities     ALLERGIES:  has No Known Allergies.  MEDICATIONS:  Current Outpatient Prescriptions  Medication Sig Dispense Refill  . acyclovir (ZOVIRAX) 400 MG tablet TAKE ONE TABLET BY MOUTH TWICE DAILY  60 tablet  0  . ALPRAZolam (XANAX) 0.5 MG tablet TAKE ONE TABLET BY MOUTH THREE TIMES DAILY AS NEEDED AND FOR SLEEP  60 tablet  0  . Calcium Carbonate-Vitamin D 600-400 MG-UNIT per tablet Take 1 tablet by mouth 3 (three) times daily with meals.        Marland Kitchen dexamethasone (DECADRON) 4 MG tablet TAKE TEN TABLETS BY MOUTH ONCE A WEEK  40 tablet  1  . gabapentin (NEURONTIN) 100 MG capsule 100 mg. Takes 3  ( 300 mg) by mouth TID      . glipiZIDE (GLUCOTROL XL) 2.5 MG 24 hr tablet Take 1 tablet (2.5 mg total) by mouth daily.  90 tablet  0  . glucose blood (ONE TOUCH ULTRA TEST) test strip Use as instructed  25 each  6  . ibuprofen (ADVIL,MOTRIN) 200 MG tablet Take 200 mg by mouth every 6 (six) hours as needed.        . lidocaine (LIDODERM) 5 % Place 1 patch onto the skin daily. Remove & Discard patch within 12 hours or as directed by MD  30 patch  0  . lisinopril-hydrochlorothiazide (PRINZIDE,ZESTORETIC) 20-12.5 MG per tablet Take 0.5 tablets by mouth daily.  45 tablet  3  . magnesium oxide (MAG-OX) 400 MG tablet Take 400 mg by mouth daily.       Marland Kitchen oxyCODONE (OXY IR/ROXICODONE) 5 MG immediate release tablet Take 1 1/2 tabs by mouth every 6 hours as needed for pain  90 tablet  0  . oxyCODONE (OXYCONTIN) 10 MG 12 hr tablet Take 1 tablet (10 mg total) by mouth every 12 (twelve) hours.  60 tablet  0   . pomalidomide (POMALYST) 4 MG capsule Take 1 capsule (4 mg total) by mouth daily. Take with water on days 1-21. Repeat every 28 days.02/08/13-pomalyst authorization number [id number]  21 capsule  0  . potassium chloride SA (K-DUR,KLOR-CON) 20 MEQ tablet Take 1 tablet (20 mEq total) by mouth daily.  7 tablet  0  . temazepam (RESTORIL) 30 MG capsule TAKE ONE CAPSULE BY MOUTH AT BEDTIME  30 capsule  0  . warfarin (COUMADIN) 5 MG tablet TAKE ONE TABLET BY MOUTH ONCE DAILY ON TUESDAY, THURSDAY AND SATURDAY.  TAKE 1/2 TABLET ON ALL OTHER DAYS OR AS DIRECTED  60 tablet  0  . Lancets (ONETOUCH ULTRASOFT) lancets Test blood sugar once daily or as directed.  100 each  11  . prochlorperazine (COMPAZINE) 10 MG tablet Take 1 tablet (10 mg total) by mouth every 6 (six) hours as needed.  30 tablet  1   No current facility-administered medications for this visit.   Facility-Administered Medications Ordered in Other Visits  Medication Dose Route Frequency Provider Last Rate Last Dose  . Zoledronic Acid (ZOMETA) 4 mg IVPB  4 mg Intravenous Once Si Gaul, MD        SURGICAL HISTORY:  Past Surgical History  Procedure Laterality Date  . Abdominal hysterectomy      REVIEW OF SYSTEMS:  A comprehensive review of systems was negative except for: Constitutional: positive for fatigue Neurological: positive for paresthesia   PHYSICAL EXAMINATION: General appearance: alert, cooperative and no distress Head: Normocephalic, without obvious abnormality, atraumatic Neck: no adenopathy Lymph nodes: Cervical, supraclavicular, and axillary nodes normal. Resp: clear to auscultation bilaterally Cardio: regular rate and rhythm, S1, S2 normal, no murmur, click, rub or gallop GI: soft, non-tender; bowel sounds normal; no masses,  no organomegaly Extremities: extremities normal, atraumatic, no cyanosis or edema  ECOG PERFORMANCE STATUS: 1 - Symptomatic but completely ambulatory  Blood pressure 151/65, pulse 56,  temperature 98.2 F (36.8 C), temperature source Oral, resp. rate 18, height 5' (1.524 m), weight 131 lb 6.4 oz (59.603 kg).  LABORATORY DATA: Lab Results  Component Value Date   WBC 3.0* 02/14/2013   HGB 11.9 02/14/2013   HCT 36.9 02/14/2013   MCV 92.5 02/14/2013   PLT 314 02/14/2013      Chemistry      Component Value Date/Time   NA 144 02/14/2013 1311   NA 141 06/24/2012 1620   K 3.2* 02/14/2013 1311   K 3.1* 06/24/2012 1620   CL 107 02/14/2013 1311   CL 101 06/24/2012 1620   CO2 25 02/14/2013 1311   CO2 32 06/24/2012 1620   BUN 14.2 02/14/2013  1311   BUN 15 06/24/2012 1620   CREATININE 1.0 02/14/2013 1311   CREATININE 0.96 06/24/2012 1620      Component Value Date/Time   CALCIUM 9.8 02/14/2013 1311   CALCIUM 9.4 06/24/2012 1620   CALCIUM  Value: 5.7 CRITICAL RESULT CALLED TO, READ BACK BY AND VERIFIED WITH: JAMIE TRACY,RN [id number] @ 1433 BY J SCOTTON (NOTE)  Amended report. Result repeated and verified. CORRECTED ON 10/14 AT 1429: PREVIOUSLY REPORTED AS Result repeated and verified.* 06/13/2008 1605   ALKPHOS 57 02/14/2013 1311   ALKPHOS 50 06/24/2012 1620   AST 11 02/14/2013 1311   AST 19 06/24/2012 1620   ALT 15 02/14/2013 1311   ALT 15 06/24/2012 1620   BILITOT 0.37 02/14/2013 1311   BILITOT 0.2* 06/24/2012 1620       RADIOGRAPHIC STUDIES: No results found.  ASSESSMENT AND PLAN: This is a very pleasant 77 years old [place] American female with history of multiple myeloma currently on treatment with Pomalyst and Decadron. The patient is tolerating her treatment fairly well with no significant adverse effects. I recommended for her to continue treatment with the same regimen for now. I would see her back for followup visit in one month with repeat myeloma panel. For the peripheral neuropathy, the patient will continue on Neurontin as well as pain medication. She was advised to call immediately if she has any concerning symptoms in the interval.  All questions were answered. The  patient knows to call the clinic with any problems, questions or concerns. We can certainly see the patient much sooner if necessary.

## 2013-02-14 NOTE — Telephone Encounter (Signed)
Gave pt appt for lab and MD visit with chemo on July 2014

## 2013-02-14 NOTE — Telephone Encounter (Signed)
Per staff message and POF I have scheduled appts.  JMW  

## 2013-02-15 MED ORDER — GLIPIZIDE ER 2.5 MG PO TB24: 2.5000 mg | ORAL_TABLET | Freq: Every day | ORAL | Status: DC

## 2013-02-15 NOTE — Telephone Encounter (Signed)
Will forward phone note to Oncology

## 2013-02-15 NOTE — Telephone Encounter (Signed)
Sent.  Please call oncology and see about them getting an A1c done on her next lab draw.  Order is in.  Thanks.

## 2013-02-16 ENCOUNTER — Other Ambulatory Visit: Payer: Self-pay | Admitting: *Deleted

## 2013-02-16 DIAGNOSIS — C9000 Multiple myeloma not having achieved remission: Secondary | ICD-10-CM

## 2013-02-16 MED ORDER — OXYCODONE HCL 5 MG PO TABS: ORAL_TABLET | ORAL | Status: DC

## 2013-02-23 ENCOUNTER — Other Ambulatory Visit: Payer: Self-pay | Admitting: Internal Medicine

## 2013-02-25 ENCOUNTER — Other Ambulatory Visit: Payer: Self-pay | Admitting: Internal Medicine

## 2013-02-27 ENCOUNTER — Other Ambulatory Visit: Payer: Self-pay | Admitting: Physician Assistant

## 2013-02-27 DIAGNOSIS — C3490 Malignant neoplasm of unspecified part of unspecified bronchus or lung: Secondary | ICD-10-CM

## 2013-02-28 ENCOUNTER — Other Ambulatory Visit: Payer: Self-pay | Admitting: Physician Assistant

## 2013-03-08 ENCOUNTER — Other Ambulatory Visit: Payer: Self-pay | Admitting: *Deleted

## 2013-03-08 NOTE — Telephone Encounter (Signed)
THIS REFILL REQUEST FOR POMALYST WAS GIVEN TO DR.MOHAMED'S NURSE, DIANE BELL,RN. 

## 2013-03-10 ENCOUNTER — Ambulatory Visit (HOSPITAL_BASED_OUTPATIENT_CLINIC_OR_DEPARTMENT_OTHER): Payer: Medicare Other | Admitting: Pharmacist

## 2013-03-10 ENCOUNTER — Other Ambulatory Visit: Payer: Self-pay | Admitting: Medical Oncology

## 2013-03-10 ENCOUNTER — Other Ambulatory Visit: Payer: Self-pay

## 2013-03-10 ENCOUNTER — Ambulatory Visit: Payer: Medicare Other | Admitting: Lab

## 2013-03-10 DIAGNOSIS — C9000 Multiple myeloma not having achieved remission: Secondary | ICD-10-CM

## 2013-03-10 DIAGNOSIS — I82409 Acute embolism and thrombosis of unspecified deep veins of unspecified lower extremity: Secondary | ICD-10-CM

## 2013-03-10 DIAGNOSIS — I824Y9 Acute embolism and thrombosis of unspecified deep veins of unspecified proximal lower extremity: Secondary | ICD-10-CM

## 2013-03-10 DIAGNOSIS — I82401 Acute embolism and thrombosis of unspecified deep veins of right lower extremity: Secondary | ICD-10-CM

## 2013-03-10 LAB — PROTIME-INR: INR: 2.2 (ref 2.00–3.50)

## 2013-03-10 MED ORDER — POMALIDOMIDE 4 MG PO CAPS: 4.0000 mg | ORAL_CAPSULE | Freq: Every day | ORAL | Status: DC

## 2013-03-10 NOTE — Progress Notes (Signed)
INR within goal today. No changes in medications, diet, etc.  No missed doses. Pomalyst to resume on 03/17/13 (tenatively) after MD visit. No problems to report regarding anticoagulation. Pt took coumadin as instructed since last visit. Continue Coumadin 2.5 mg daily except 5mg  on  Su/Mon/Wed. Recheck PT/INR with scheduled lab on 03/16/13: Lab at 1:45pm and coumadin clinic at 2pm.

## 2013-03-10 NOTE — Patient Instructions (Signed)
Continue Coumadin 2.5 mg daily except 5mg  on  Su/Mon/Wed. Recheck PT/INR with scheduled lab on 03/16/13: Lab at 1:45pm and coumadin clinic at 2pm.

## 2013-03-10 NOTE — Telephone Encounter (Signed)
Pomalyst refill faxed to [facility]

## 2013-03-14 ENCOUNTER — Other Ambulatory Visit: Payer: Self-pay | Admitting: *Deleted

## 2013-03-14 DIAGNOSIS — C9000 Multiple myeloma not having achieved remission: Secondary | ICD-10-CM

## 2013-03-14 MED ORDER — OXYCODONE HCL 10 MG PO TB12: 10.0000 mg | ORAL_TABLET | Freq: Two times a day (BID) | ORAL | Status: DC

## 2013-03-14 MED ORDER — OXYCODONE HCL 5 MG PO TABS: ORAL_TABLET | ORAL | Status: DC

## 2013-03-15 NOTE — Telephone Encounter (Signed)
RECEIVED A FAX FROM [facility] CONCERNING A CONFIRMATION OF PRESCRIPTION SHIPMENT FOR POMALYST ON 03/14/13.

## 2013-03-16 ENCOUNTER — Ambulatory Visit (HOSPITAL_BASED_OUTPATIENT_CLINIC_OR_DEPARTMENT_OTHER): Payer: Medicare Other | Admitting: Pharmacist

## 2013-03-16 ENCOUNTER — Other Ambulatory Visit (HOSPITAL_BASED_OUTPATIENT_CLINIC_OR_DEPARTMENT_OTHER): Payer: Medicare Other | Admitting: Lab

## 2013-03-16 DIAGNOSIS — C9000 Multiple myeloma not having achieved remission: Secondary | ICD-10-CM

## 2013-03-16 DIAGNOSIS — I82401 Acute embolism and thrombosis of unspecified deep veins of right lower extremity: Secondary | ICD-10-CM

## 2013-03-16 DIAGNOSIS — I82409 Acute embolism and thrombosis of unspecified deep veins of unspecified lower extremity: Secondary | ICD-10-CM

## 2013-03-16 LAB — CBC WITH DIFFERENTIAL/PLATELET
Basophils Absolute: 0.1 10*3/uL (ref 0.0–0.1)
EOS%: 1.4 % (ref 0.0–7.0)
Eosinophils Absolute: 0.1 10*3/uL (ref 0.0–0.5)
HGB: 11.7 g/dL (ref 11.6–15.9)
LYMPH%: 19 % (ref 14.0–49.7)
MCH: 30.9 pg (ref 25.1–34.0)
MCV: 95 fL (ref 79.5–101.0)
MONO%: 17.6 % — ABNORMAL HIGH (ref 0.0–14.0)
NEUT#: 2.2 10*3/uL (ref 1.5–6.5)
NEUT%: 58.8 % (ref 38.4–76.8)
Platelets: 341 10*3/uL (ref 145–400)
RDW: 18.3 % — ABNORMAL HIGH (ref 11.2–14.5)

## 2013-03-16 LAB — PROTIME-INR: INR: 2.8 (ref 2.00–3.50)

## 2013-03-16 LAB — COMPREHENSIVE METABOLIC PANEL (CC13)
AST: 14 U/L (ref 5–34)
Albumin: 3 g/dL — ABNORMAL LOW (ref 3.5–5.0)
Alkaline Phosphatase: 53 U/L (ref 40–150)
BUN: 13.6 mg/dL (ref 7.0–26.0)
Creatinine: 1.1 mg/dL (ref 0.6–1.1)
Glucose: 127 mg/dl (ref 70–140)
Total Bilirubin: 0.32 mg/dL (ref 0.20–1.20)

## 2013-03-16 LAB — POCT INR: INR: 2.8

## 2013-03-16 NOTE — Progress Notes (Signed)
INR at goal on 5mg  Sun/M/W and 2.5mg  other days.  [name] is c/o of pain in left leg which started on Sat.  She has appt with Dr. [name] tomorrow.  I told Lindsay Eppard that she could wait for triage RN if she did not want to wait until tomorrow to be seen.  Her INR continues to be at goal on current coumadin dose.  Will check PT/INR again next month with next MD visit.

## 2013-03-17 ENCOUNTER — Telehealth: Payer: Self-pay | Admitting: Internal Medicine

## 2013-03-17 ENCOUNTER — Ambulatory Visit (HOSPITAL_BASED_OUTPATIENT_CLINIC_OR_DEPARTMENT_OTHER): Payer: Medicare Other

## 2013-03-17 ENCOUNTER — Ambulatory Visit (HOSPITAL_BASED_OUTPATIENT_CLINIC_OR_DEPARTMENT_OTHER): Payer: Medicare Other | Admitting: Internal Medicine

## 2013-03-17 ENCOUNTER — Ambulatory Visit (HOSPITAL_COMMUNITY)
Admission: RE | Admit: 2013-03-17 | Discharge: 2013-03-17 | Disposition: A | Discharge status: Home / Self Care | Payer: Medicare Other | Source: Ambulatory Visit | Attending: Internal Medicine | Admitting: Internal Medicine

## 2013-03-17 ENCOUNTER — Other Ambulatory Visit: Payer: Medicare Other | Admitting: Lab

## 2013-03-17 ENCOUNTER — Telehealth: Payer: Self-pay | Admitting: *Deleted

## 2013-03-17 VITALS — BP 115/64 | HR 68 | Temp 97.4°F | Resp 18 | Ht 60.0 in | Wt 131.3 lb

## 2013-03-17 DIAGNOSIS — Z4789 Encounter for other orthopedic aftercare: Secondary | ICD-10-CM | POA: Insufficient documentation

## 2013-03-17 DIAGNOSIS — C9000 Multiple myeloma not having achieved remission: Secondary | ICD-10-CM

## 2013-03-17 DIAGNOSIS — M25559 Pain in unspecified hip: Secondary | ICD-10-CM | POA: Insufficient documentation

## 2013-03-17 LAB — KAPPA/LAMBDA LIGHT CHAINS
Kappa free light chain: 0.96 mg/dL (ref 0.33–1.94)
Kappa:Lambda Ratio: 0.08 — ABNORMAL LOW (ref 0.26–1.65)
Lambda Free Lght Chn: 11.3 mg/dL — ABNORMAL HIGH (ref 0.57–2.63)

## 2013-03-17 LAB — IGG, IGA, IGM
IgA: 137 mg/dL (ref 69–380)
IgG (Immunoglobin G), Serum: 538 mg/dL — ABNORMAL LOW (ref 690–1700)

## 2013-03-17 LAB — BETA 2 MICROGLOBULIN, SERUM: Beta-2 Microglobulin: 2.02 mg/L — ABNORMAL HIGH (ref 1.01–1.73)

## 2013-03-17 MED ORDER — HEPARIN SOD (PORK) LOCK FLUSH 100 UNIT/ML IV SOLN
500.0000 [IU] | Freq: Once | INTRAVENOUS | Status: DC | PRN
  Filled 2013-03-17: qty 5

## 2013-03-17 MED ORDER — ZOLEDRONIC ACID 4 MG/100ML IV SOLN
4.0000 mg | Freq: Once | INTRAVENOUS | Status: AC
  Administered 2013-03-17: 4 mg via INTRAVENOUS
  Filled 2013-03-17: qty 100

## 2013-03-17 MED ORDER — SODIUM CHLORIDE 0.9 % IJ SOLN
10.0000 mL | INTRAMUSCULAR | Status: DC | PRN
  Filled 2013-03-17: qty 10

## 2013-03-17 NOTE — Patient Instructions (Addendum)
Zoledronic Acid injection (Hypercalcemia, Oncology) What is this medicine? ZOLEDRONIC ACID (ZOE le dron ik AS id) lowers the amount of calcium loss from bone. It is used to treat too much calcium in your blood from cancer. It is also used to prevent complications of cancer that has spread to the bone. This medicine may be used for other purposes; ask your health care provider or pharmacist if you have questions. What should I tell my health care provider before I take this medicine? They need to know if you have any of these conditions: -aspirin-sensitive asthma -dental disease -kidney disease -an unusual or allergic reaction to zoledronic acid, other medicines, foods, dyes, or preservatives -pregnant or trying to get pregnant -breast-feeding How should I use this medicine? This medicine is for infusion into a vein. It is given by a health care professional in a hospital or clinic setting. Talk to your pediatrician regarding the use of this medicine in children. Special care may be needed. Overdosage: If you think you have taken too much of this medicine contact a poison control center or emergency room at once. NOTE: This medicine is only for you. Do not share this medicine with others. What if I miss a dose? It is important not to miss your dose. Call your doctor or health care professional if you are unable to keep an appointment. What may interact with this medicine? -certain antibiotics given by injection -NSAIDs, medicines for pain and inflammation, like ibuprofen or naproxen -some diuretics like bumetanide, furosemide -teriparatide -thalidomide This list may not describe all possible interactions. Give your health care provider a list of all the medicines, herbs, non-prescription drugs, or dietary supplements you use. Also tell them if you smoke, drink alcohol, or use illegal drugs. Some items may interact with your medicine. What should I watch for while using this medicine? Visit  your doctor or health care professional for regular checkups. It may be some time before you see the benefit from this medicine. Do not stop taking your medicine unless your doctor tells you to. Your doctor may order blood tests or other tests to see how you are doing. Women should inform their doctor if they wish to become pregnant or think they might be pregnant. There is a potential for serious side effects to an unborn child. Talk to your health care professional or pharmacist for more information. You should make sure that you get enough calcium and vitamin D while you are taking this medicine. Discuss the foods you eat and the vitamins you take with your health care professional. Some people who take this medicine have severe bone, joint, and/or muscle pain. This medicine may also increase your risk for a broken thigh bone. Tell your doctor right away if you have pain in your upper leg or groin. Tell your doctor if you have any pain that does not go away or that gets worse. What side effects may I notice from receiving this medicine? Side effects that you should report to your doctor or health care professional as soon as possible: -allergic reactions like skin rash, itching or hives, swelling of the face, lips, or tongue -anxiety, confusion, or depression -breathing problems -changes in vision -feeling faint or lightheaded, falls -jaw burning, cramping, pain -muscle cramps, stiffness, or weakness -trouble passing urine or change in the amount of urine Side effects that usually do not require medical attention (report to your doctor or health care professional if they continue or are bothersome): -bone, joint, or muscle pain -  fever -hair loss -irritation at site where injected -loss of appetite -nausea, vomiting -stomach upset -tired This list may not describe all possible side effects. Call your doctor for medical advice about side effects. You may report side effects to FDA at  1-800-FDA-1088. Where should I keep my medicine? This drug is given in a hospital or clinic and will not be stored at home. NOTE: This sheet is a summary. It may not cover all possible information. If you have questions about this medicine, talk to your doctor, pharmacist, or health care provider.  2012, [employer]/Gold Standard. (02/14/2011 9:06:58 AM) 

## 2013-03-17 NOTE — Telephone Encounter (Signed)
Per staff message and POF I have scheduled appts.  JMW  

## 2013-03-17 NOTE — Telephone Encounter (Signed)
gv and printed appt sched and avs for pt....MW added tx   °

## 2013-03-17 NOTE — Progress Notes (Signed)
[facility] Telephone:([phone]   Fax:([phone]) [phone]  OFFICE PROGRESS NOTE  [name], MD [address] [place] [address]  DIAGNOSIS:  1. multiple myeloma diagnosed in September 2009,  2. history of bilateral lower extremity deep vein thrombosis diagnosed in November 2009  3. acute on chronic deep vein thrombosis in the left lower extremity diagnosed in October 2010.   PRIOR THERAPY:  1. status post palliative radiotherapy to the right femur and is she'll tuberosity, first was done in December 2011 and the second treatment was completed Jan 08 2011, and the third treatment to the right distal femur completed on 04/30/2011  2. status post palliative radiotherapy to the left proximal femur completed 02/28/2011.  3. Revlimid 25 mg by mouth daily for 21 days every 4 weeks in addition to oral Decadron at 40 mg by mouth on a weekly basis status post 40 cycles.  4. weekly subcutaneous Velcade 1.3 mg/M2 status post 60 cycles, discontinued today secondary to mild disease progression.   CURRENT THERAPY:  1. Pomalyst 4 mg by mouth daily for 21 days every 4 weeks, the patient started the first dose on 12/22/2012 , in addition to Decadron 40 mg weekly. Second cycle was Pomalyst expected to start on 01/18/2013  2. Coumadin 5 mg on Mondays and Thursdays and 2.5 mg on all other days.  3. Zometa 4 mg IV given every 2 months.    INTERVAL HISTORY: [name] 77 y.o. female returns to the clinic today for followup visit accompanied by her 2 daughters. The patient is feeling fine today except for left hip pain started 2 days ago. She denied having any other significant complaints. The patient has no chest pain, shortness breath, cough or hemoptysis. She denied having any significant weight loss or night sweats. She is tolerating her treatment was Pomalyst fairly well with no significant adverse effects. The patient has repeat myeloma panel performed recently and she is  here for evaluation and discussion of her lab results and recommendation regarding treatment of her condition.  MEDICAL HISTORY: Past Medical History  Diagnosis Date  . Blood transfusion   . Depression     Mild  . History of chicken pox   . Hyperlipidemia   . Hypertension   . Degenerative joint disease   . Diabetes mellitus     Steroid related  . Phlebitis   . Multiple myeloma     per Dr. [name], s/p palliative radiation for leg pain 2011 per Dr. Roselind Messier  . Deep vein thrombosis of bilateral lower extremities     ALLERGIES:  has No Known Allergies.  MEDICATIONS:  Current Outpatient Prescriptions  Medication Sig Dispense Refill  . acyclovir (ZOVIRAX) 400 MG tablet TAKE ONE TABLET BY MOUTH TWICE DAILY  60 tablet  0  . ALPRAZolam (XANAX) 0.5 MG tablet TAKE ONE TABLET BY MOUTH THREE TIMES DAILY AS NEEDED AND FOR SLEEP  60 tablet  0  . Calcium Carbonate-Vitamin D 600-400 MG-UNIT per tablet Take 1 tablet by mouth 3 (three) times daily with meals.        Marland Kitchen dexamethasone (DECADRON) 4 MG tablet TAKE 10 TABS EACH WEEK  40 tablet  0  . gabapentin (NEURONTIN) 100 MG capsule 100 mg. Takes 3  ( 300 mg) by mouth TID      . glipiZIDE (GLUCOTROL XL) 2.5 MG 24 hr tablet Take 1 tablet (2.5 mg total) by mouth daily.  90 tablet  3  . glucose  blood (ONE TOUCH ULTRA TEST) test strip Use as instructed  25 each  6  . ibuprofen (ADVIL,MOTRIN) 200 MG tablet Take 200 mg by mouth every 6 (six) hours as needed.        . Lancets (ONETOUCH ULTRASOFT) lancets Test blood sugar once daily or as directed.  100 each  11  . lidocaine (LIDODERM) 5 % Place 1 patch onto the skin daily. Remove & Discard patch within 12 hours or as directed by MD  30 patch  0  . lisinopril-hydrochlorothiazide (PRINZIDE,ZESTORETIC) 20-12.5 MG per tablet Take 0.5 tablets by mouth daily.  45 tablet  3  . magnesium oxide (MAG-OX) 400 MG tablet Take 400 mg by mouth daily.       Marland Kitchen oxyCODONE (OXY IR/ROXICODONE) 5 MG immediate release tablet Take  1 1/2 tabs by mouth every 6 hours as needed for pain  90 tablet  0  . oxyCODONE (OXYCONTIN) 10 MG 12 hr tablet Take 1 tablet (10 mg total) by mouth every 12 (twelve) hours.  60 tablet  0  . pomalidomide (POMALYST) 4 MG capsule Take 1 capsule (4 mg total) by mouth daily. Take with water on days 1-21. Repeat every 28 days.03/10/13-pomalyst authorization number [id number]  21 capsule  0  . potassium chloride SA (K-DUR,KLOR-CON) 20 MEQ tablet Take 1 tablet (20 mEq total) by mouth daily.  7 tablet  0  . prochlorperazine (COMPAZINE) 10 MG tablet Take 1 tablet (10 mg total) by mouth every 6 (six) hours as needed.  30 tablet  1  . temazepam (RESTORIL) 30 MG capsule TAKE ONE CAPSULE BY MOUTH AT BEDTIME  30 capsule  0  . warfarin (COUMADIN) 5 MG tablet TAKE ONE TABLET BY MOUTH ONCE DAILY ON TUESDAY, THURSDAY AND SATURDAY.  TAKE 1/2 TABLET ON ALL OTHER DAYS OR AS DIRECTED  60 tablet  0   No current facility-administered medications for this visit.   Facility-Administered Medications Ordered in Other Visits  Medication Dose Route Frequency Provider Last Rate Last Dose  . Zoledronic Acid (ZOMETA) 4 mg IVPB  4 mg Intravenous Once Si Gaul, MD        SURGICAL HISTORY:  Past Surgical History  Procedure Laterality Date  . Abdominal hysterectomy      REVIEW OF SYSTEMS:  A comprehensive review of systems was negative except for: Musculoskeletal: positive for Left hip pain   PHYSICAL EXAMINATION: General appearance: alert, cooperative and no distress Head: Normocephalic, without obvious abnormality, atraumatic Neck: no adenopathy Lymph nodes: Cervical, supraclavicular, and axillary nodes normal. Resp: clear to auscultation bilaterally Back: symmetric, no curvature. ROM normal. No CVA tenderness. Cardio: regular rate and rhythm, S1, S2 normal, no murmur, click, rub or gallop GI: soft, non-tender; bowel sounds normal; no masses,  no organomegaly Extremities: extremities normal, atraumatic, no cyanosis or  edema Neurologic: Alert and oriented X 3, normal strength and tone. Normal symmetric reflexes. Normal coordination and gait  ECOG PERFORMANCE STATUS: 1 - Symptomatic but completely ambulatory  There were no vitals taken for this visit.  LABORATORY DATA: Lab Results  Component Value Date   WBC 3.7* 03/16/2013   HGB 11.7 03/16/2013   HCT 36.1 03/16/2013   MCV 95.0 03/16/2013   PLT 341 03/16/2013      Chemistry      Component Value Date/Time   NA 144 03/16/2013 1324   NA 141 06/24/2012 1620   K 4.1 03/16/2013 1324   K 3.1* 06/24/2012 1620   CL 107 02/14/2013 1311   CL 101  06/24/2012 1620   CO2 29 03/16/2013 1324   CO2 32 06/24/2012 1620   BUN 13.6 03/16/2013 1324   BUN 15 06/24/2012 1620   CREATININE 1.1 03/16/2013 1324   CREATININE 0.96 06/24/2012 1620      Component Value Date/Time   CALCIUM 9.3 03/16/2013 1324   CALCIUM 9.4 06/24/2012 1620   CALCIUM  Value: 5.7 CRITICAL RESULT CALLED TO, READ BACK BY AND VERIFIED WITH: JAMIE TRACY,RN [id number] @ 1433 BY J SCOTTON (NOTE)  Amended report. Result repeated and verified. CORRECTED ON 10/14 AT 1429: PREVIOUSLY REPORTED AS Result repeated and verified.* 06/13/2008 1605   ALKPHOS 53 03/16/2013 1324   ALKPHOS 50 06/24/2012 1620   AST 14 03/16/2013 1324   AST 19 06/24/2012 1620   ALT 18 03/16/2013 1324   ALT 15 06/24/2012 1620   BILITOT 0.32 03/16/2013 1324   BILITOT 0.2* 06/24/2012 1620       RADIOGRAPHIC STUDIES: No results found.  ASSESSMENT AND PLAN: This is a very pleasant 77 years old [place] American female with history of multiple myeloma diagnosed in September of 2009 has been on several chemotherapy regimen and currently on Pomalyst milligram by mouth daily for 21 days every 4 weeks in addition to weekly Decadron. And myeloma panel showed no evidence for disease progression. I recommended for the patient to proceed with her treatment was Pomalyst as scheduled.  For the left hip, I will arrange for the patient to have x-ray of the  left hip to rule out any lytic lesions or fracture. I will call the patient with results and further recommendations if needed. She'll continue on her current pain medication. The patient would come back for followup visit in 2 weeks for evaluation and management any adverse effect of her treatment. She was advised to call immediately if she has any concerning symptoms in the interval.  All questions were answered. The patient knows to call the clinic with any problems, questions or concerns. We can certainly see the patient much sooner if necessary.  I spent 15 minutes counseling the patient face to face. The total time spent in the appointment was 25 minutes.

## 2013-03-18 ENCOUNTER — Telehealth: Payer: Self-pay | Admitting: *Deleted

## 2013-03-18 NOTE — Telephone Encounter (Signed)
Pt's daughter Sue Lush called per Dr Donnald Garre request to get the results of the myeloma panel to see if she can continue pomalyst and to get the xray result of the hip.  Per Dr Donnald Garre, she can resume pomalyst and the x-ray was negative.  Left msg on Andrea's vm with information.  SLJ

## 2013-03-19 ENCOUNTER — Other Ambulatory Visit: Payer: Self-pay | Admitting: Internal Medicine

## 2013-03-19 ENCOUNTER — Encounter: Payer: Self-pay | Admitting: Internal Medicine

## 2013-03-19 NOTE — Patient Instructions (Signed)
Myeloma panel showed no evidence for disease progression.  Continue treatment was Pomalyst and Decadron.  Followup visit in 2 weeks.  Left hip x-ray for evaluation of the left hip pain

## 2013-03-22 ENCOUNTER — Other Ambulatory Visit: Payer: Self-pay | Admitting: Internal Medicine

## 2013-03-22 ENCOUNTER — Other Ambulatory Visit: Payer: Self-pay | Admitting: Family Medicine

## 2013-03-23 NOTE — Telephone Encounter (Signed)
Please clarify current dose with the family/pt and let me know.  Thanks.

## 2013-03-23 NOTE — Telephone Encounter (Signed)
Received refill request from pharmacy. Directions do not match medication list. Last office visit 12/09/12. Is it okay to refill medication?

## 2013-03-23 NOTE — Telephone Encounter (Signed)
Per Dr. [name], she will not require refill on this if she is having no s/s of shingles outbreak since she has completed her Velcade therapy. Left VM for patient to call office to discuss refill request.

## 2013-03-24 ENCOUNTER — Other Ambulatory Visit (HOSPITAL_BASED_OUTPATIENT_CLINIC_OR_DEPARTMENT_OTHER): Payer: Medicare Other

## 2013-03-24 DIAGNOSIS — I82409 Acute embolism and thrombosis of unspecified deep veins of unspecified lower extremity: Secondary | ICD-10-CM

## 2013-03-24 DIAGNOSIS — I82401 Acute embolism and thrombosis of unspecified deep veins of right lower extremity: Secondary | ICD-10-CM

## 2013-03-24 NOTE — Telephone Encounter (Signed)
Electronic refill request.  Please advise. 

## 2013-03-25 NOTE — Telephone Encounter (Signed)
Please clarify current dose with the family/pt and let me know.  Thanks.  

## 2013-03-25 NOTE — Telephone Encounter (Signed)
[name] left v/m requesting gabapentin be refilled today.Please advise.

## 2013-03-25 NOTE — Telephone Encounter (Signed)
[name] called with status of refill; Derwood Kaplan advised pt is presently taking Gabapentin 100 mg taking 3 caps by mouth three times a day; but pt can build up to more with directions 1-4 caps by mouth three times a day.

## 2013-03-25 NOTE — Telephone Encounter (Signed)
Thanks. Sent.

## 2013-03-30 ENCOUNTER — Other Ambulatory Visit: Payer: Self-pay | Admitting: Physician Assistant

## 2013-03-31 ENCOUNTER — Telehealth: Payer: Self-pay | Admitting: Internal Medicine

## 2013-03-31 ENCOUNTER — Ambulatory Visit (HOSPITAL_BASED_OUTPATIENT_CLINIC_OR_DEPARTMENT_OTHER): Payer: Medicare Other | Admitting: Physician Assistant

## 2013-03-31 ENCOUNTER — Encounter: Payer: Self-pay | Admitting: Physician Assistant

## 2013-03-31 ENCOUNTER — Ambulatory Visit (HOSPITAL_BASED_OUTPATIENT_CLINIC_OR_DEPARTMENT_OTHER): Payer: Medicare Other | Admitting: Pharmacist

## 2013-03-31 ENCOUNTER — Other Ambulatory Visit (HOSPITAL_BASED_OUTPATIENT_CLINIC_OR_DEPARTMENT_OTHER): Payer: Medicare Other | Admitting: Lab

## 2013-03-31 VITALS — BP 146/59 | HR 72 | Temp 98.1°F | Resp 20 | Ht 60.0 in | Wt 136.5 lb

## 2013-03-31 DIAGNOSIS — C9000 Multiple myeloma not having achieved remission: Secondary | ICD-10-CM

## 2013-03-31 DIAGNOSIS — I82409 Acute embolism and thrombosis of unspecified deep veins of unspecified lower extremity: Secondary | ICD-10-CM

## 2013-03-31 DIAGNOSIS — I82401 Acute embolism and thrombosis of unspecified deep veins of right lower extremity: Secondary | ICD-10-CM

## 2013-03-31 LAB — PROTIME-INR: Protime: 31.2 Seconds — ABNORMAL HIGH (ref 10.6–13.4)

## 2013-03-31 LAB — POCT INR: INR: 2.6

## 2013-03-31 MED ORDER — ALPRAZOLAM 0.5 MG PO TABS: ORAL_TABLET | ORAL | Status: DC

## 2013-03-31 NOTE — Progress Notes (Signed)
INR back on goal at 2.6.  [name] only c/o of continued pain in hip.  She has appt with Tiana Loft, PA today.  Will continue current dose of coumadin 2.5mg  daily except 5mg  Sun, Mon and Wed.  Will check PT/INR in 2 weeks to ensure INR remains at goal.

## 2013-03-31 NOTE — Patient Instructions (Addendum)
Continue taking your Pomalyst and Decadron as prescribed You'll be scheduled for an MRI of your lumbar spine and pelvis to further evaluate her complaints of pain in these areas. Followup in 4 weeks for another symptom management visit

## 2013-03-31 NOTE — Telephone Encounter (Signed)
gv relative appt schedule for August and September. [facility] will contact pt re mri appt.

## 2013-03-31 NOTE — Progress Notes (Addendum)
[facility] [phone]   [phone]  OFFICE PROGRESS NOTE  [name], MD [address] [place] [address]  DIAGNOSIS:  1. multiple myeloma diagnosed in September 2009,  2. history of bilateral lower extremity deep vein thrombosis diagnosed in November 2009  3. acute on chronic deep vein thrombosis in the left lower extremity diagnosed in October 2010.   PRIOR THERAPY:  1. status post palliative radiotherapy to the right femur and is she'll tuberosity, first was done in December 2011 and the second treatment was completed Jan 08 2011, and the third treatment to the right distal femur completed on 04/30/2011  2. status post palliative radiotherapy to the left proximal femur completed 02/28/2011.  3. Revlimid 25 mg by mouth daily for 21 days every 4 weeks in addition to oral Decadron at 40 mg by mouth on a weekly basis status post 40 cycles.  4. weekly subcutaneous Velcade 1.3 mg/M2 status post 60 cycles, discontinued today secondary to mild disease progression.   CURRENT THERAPY:  1. Pomalyst 4 mg by mouth daily for 21 days every 4 weeks, the patient started the first dose on 12/22/2012 , in addition to Decadron 40 mg weekly.  2. Coumadin 5 mg on Mondays and Thursdays and 2.5 mg on all other days.  3. Zometa 4 mg IV given every 2 months.    INTERVAL HISTORY: [name] 77 y.o. female returns to the clinic today for followup visit accompanied by her 2 daughters. She is tolerating her Pomalyst and Decadron without difficulty. She continues to complain of left hip pain but then states it is actually bilateral hip pain and low back pain. [name] daughters feels that she may be doing too much in terms of activities within the home and that this may be contributing to her lower back and hip discomfort. Some of these activities include doing laundry clean the bathroom and similar type activities. She denied having any other significant  complaints. The patient has no chest pain, shortness breath, cough or hemoptysis. She denied having any significant weight loss or night sweats.    MEDICAL HISTORY: Past Medical History  Diagnosis Date  . Blood transfusion   . Depression     Mild  . History of chicken pox   . Hyperlipidemia   . Hypertension   . Degenerative joint disease   . Diabetes mellitus     Steroid related  . Phlebitis   . Multiple myeloma     per Dr. [name], s/p palliative radiation for leg pain 2011 per Dr. Roselind Messier  . Deep vein thrombosis of bilateral lower extremities     ALLERGIES:  has No Known Allergies.  MEDICATIONS:  Current Outpatient Prescriptions  Medication Sig Dispense Refill  . acyclovir (ZOVIRAX) 400 MG tablet TAKE ONE TABLET BY MOUTH TWICE DAILY  60 tablet  0  . ALPRAZolam (XANAX) 0.5 MG tablet TAKE ONE TABLET BY MOUTH THREE TIMES DAILY AS NEEDED AND FOR SLEEP  60 tablet  0  . Calcium Carbonate-Vitamin D 600-400 MG-UNIT per tablet Take 1 tablet by mouth 3 (three) times daily with meals.        Marland Kitchen dexamethasone (DECADRON) 4 MG tablet TAKE 10 TABLETS BY MOUTH ONCE PER WEEK  40 tablet  0  . gabapentin (NEURONTIN) 100 MG capsule 100 mg. Takes 3  ( 300 mg) by mouth TID      . gabapentin (NEURONTIN) 100 MG capsule TAKE ONE TO FOUR CAPSULES  BY MOUTH THREE TIMES DAILY  360 capsule  3  . glipiZIDE (GLUCOTROL XL) 2.5 MG 24 hr tablet Take 1 tablet (2.5 mg total) by mouth daily.  90 tablet  3  . glucose blood (ONE TOUCH ULTRA TEST) test strip Use as instructed  25 each  6  . ibuprofen (ADVIL,MOTRIN) 200 MG tablet Take 200 mg by mouth every 6 (six) hours as needed.        . Lancets (ONETOUCH ULTRASOFT) lancets Test blood sugar once daily or as directed.  100 each  11  . lidocaine (LIDODERM) 5 % Place 1 patch onto the skin daily. Remove & Discard patch within 12 hours or as directed by MD  30 patch  0  . lisinopril-hydrochlorothiazide (PRINZIDE,ZESTORETIC) 20-12.5 MG per tablet Take 0.5 tablets by mouth  daily.  45 tablet  3  . magnesium oxide (MAG-OX) 400 MG tablet Take 400 mg by mouth daily.       Marland Kitchen oxyCODONE (OXY IR/ROXICODONE) 5 MG immediate release tablet Take 1 1/2 tabs by mouth every 6 hours as needed for pain  90 tablet  0  . oxyCODONE (OXYCONTIN) 10 MG 12 hr tablet Take 1 tablet (10 mg total) by mouth every 12 (twelve) hours.  60 tablet  0  . pomalidomide (POMALYST) 4 MG capsule Take 1 capsule (4 mg total) by mouth daily. Take with water on days 1-21. Repeat every 28 days.03/10/13-pomalyst authorization number [id number]  21 capsule  0  . potassium chloride SA (K-DUR,KLOR-CON) 20 MEQ tablet Take 1 tablet (20 mEq total) by mouth daily.  7 tablet  0  . prochlorperazine (COMPAZINE) 10 MG tablet Take 1 tablet (10 mg total) by mouth every 6 (six) hours as needed.  30 tablet  1  . temazepam (RESTORIL) 30 MG capsule TAKE ONE CAPSULE BY MOUTH AT BEDTIME  30 capsule  0  . warfarin (COUMADIN) 5 MG tablet TAKE ONE TABLET BY MOUTH ONCE DAILY ON TUESDAY, THURSDAY AND SATURDAY.  TAKE 1/2 TABLET ON ALL OTHER DAYS OR AS DIRECTED  60 tablet  0   No current facility-administered medications for this visit.   Facility-Administered Medications Ordered in Other Visits  Medication Dose Route Frequency Provider Last Rate Last Dose  . Zoledronic Acid (ZOMETA) 4 mg IVPB  4 mg Intravenous Once Si Gaul, MD        SURGICAL HISTORY:  Past Surgical History  Procedure Laterality Date  . Abdominal hysterectomy      REVIEW OF SYSTEMS:  A comprehensive review of systems was negative except for: Musculoskeletal: positive for Left hip pain   PHYSICAL EXAMINATION: General appearance: alert, cooperative and no distress Head: Normocephalic, without obvious abnormality, atraumatic Neck: no adenopathy Lymph nodes: Cervical, supraclavicular, and axillary nodes normal. Resp: clear to auscultation bilaterally Back: symmetric, no curvature. ROM normal. No CVA tenderness. Cardio: regular rate and rhythm, S1, S2  normal, no murmur, click, rub or gallop GI: soft, non-tender; bowel sounds normal; no masses,  no organomegaly Extremities: extremities normal, atraumatic, no cyanosis or edema and Point tenderness of the greater trochanteric area bilaterally as well as the lower lumbar spine. Neurologic: Alert and oriented X 3, normal strength and tone. Normal symmetric reflexes. Normal coordination and gait  ECOG PERFORMANCE STATUS: 1 - Symptomatic but completely ambulatory  Blood pressure 146/59, pulse 72, temperature 98.1 F (36.7 C), temperature source Oral, resp. rate 20, height 5' (1.524 m), weight 136 lb 8 oz (61.916 kg).  LABORATORY DATA: Lab Results  Component Value Date  WBC 3.7* 03/16/2013   HGB 11.7 03/16/2013   HCT 36.1 03/16/2013   MCV 95.0 03/16/2013   PLT 341 03/16/2013      Chemistry      Component Value Date/Time   NA 144 03/16/2013 1324   NA 141 06/24/2012 1620   K 4.1 03/16/2013 1324   K 3.1* 06/24/2012 1620   CL 107 02/14/2013 1311   CL 101 06/24/2012 1620   CO2 29 03/16/2013 1324   CO2 32 06/24/2012 1620   BUN 13.6 03/16/2013 1324   BUN 15 06/24/2012 1620   CREATININE 1.1 03/16/2013 1324   CREATININE 0.96 06/24/2012 1620      Component Value Date/Time   CALCIUM 9.3 03/16/2013 1324   CALCIUM 9.4 06/24/2012 1620   CALCIUM  Value: 5.7 CRITICAL RESULT CALLED TO, READ BACK BY AND VERIFIED WITH: JAMIE TRACY,RN [id number] @ 1433 BY J SCOTTON (NOTE)  Amended report. Result repeated and verified. CORRECTED ON 10/14 AT 1429: PREVIOUSLY REPORTED AS Result repeated and verified.* 06/13/2008 1605   ALKPHOS 53 03/16/2013 1324   ALKPHOS 50 06/24/2012 1620   AST 14 03/16/2013 1324   AST 19 06/24/2012 1620   ALT 18 03/16/2013 1324   ALT 15 06/24/2012 1620   BILITOT 0.32 03/16/2013 1324   BILITOT 0.2* 06/24/2012 1620       RADIOGRAPHIC STUDIES: No results found.  ASSESSMENT AND PLAN: This is a very pleasant 77 years old [place] American female with history of multiple myeloma diagnosed in  September of 2009 has been on several chemotherapy regimen and currently on Pomalyst milligram by mouth daily for 21 days every 4 weeks in addition to weekly Decadron.Her most recent myeloma panel showed no evidence for disease progression. I recommended for the patient to proceed with her treatment was Pomalyst as scheduled. A plain x-ray of the left hip was negative for fracture and did not specify any specific lytic lesions. The patient was discussed with him seen by Dr. [name]. We will arrange for the patient have an MRI of the lumbar spine and pelvis with and without contrast to further evaluate her complaints of pain in these areas. She will followup in 4 weeks for another symptom management visit regarding her treatment with Pomalyst and Decadron with a repeat CBC differential, C. met and LDH.   Deland Slocumb E, PA-C   She was advised to call immediately if she has any concerning symptoms in the interval.  All questions were answered. The patient knows to call the clinic with any problems, questions or concerns. We can certainly see the patient much sooner if necessary.  ADDENDUM: Hematology/Oncology Attending: I have face to face encounter with the patient today. I recommended her care plan. The patient continues to complain of pain in the hips bilaterally more on the left side. Her previous x-ray of the left hip showed no suspicious lytic lesions. I recommended for her to have MRI of the lumbar spine and hips for further evaluation of her persistent pain. She will continue with her current pain medication. She would come back for follow up visit in 3 weeks or sooner if there is any significant abnormality in her MRI.  Lajuana Matte., MD 03/31/2013

## 2013-04-04 ENCOUNTER — Other Ambulatory Visit: Payer: Self-pay | Admitting: *Deleted

## 2013-04-04 DIAGNOSIS — C9000 Multiple myeloma not having achieved remission: Secondary | ICD-10-CM

## 2013-04-04 MED ORDER — OXYCODONE HCL 5 MG PO TABS: ORAL_TABLET | ORAL | Status: DC

## 2013-04-07 ENCOUNTER — Other Ambulatory Visit: Payer: Medicare Other

## 2013-04-07 ENCOUNTER — Other Ambulatory Visit (HOSPITAL_BASED_OUTPATIENT_CLINIC_OR_DEPARTMENT_OTHER): Payer: Medicare Other | Admitting: Lab

## 2013-04-07 ENCOUNTER — Ambulatory Visit (HOSPITAL_COMMUNITY)
Admission: RE | Admit: 2013-04-07 | Discharge: 2013-04-07 | Disposition: A | Discharge status: Home / Self Care | Payer: Medicare Other | Source: Ambulatory Visit | Attending: Physician Assistant | Admitting: Physician Assistant

## 2013-04-07 DIAGNOSIS — C9000 Multiple myeloma not having achieved remission: Secondary | ICD-10-CM | POA: Insufficient documentation

## 2013-04-07 DIAGNOSIS — M76899 Other specified enthesopathies of unspecified lower limb, excluding foot: Secondary | ICD-10-CM | POA: Insufficient documentation

## 2013-04-07 DIAGNOSIS — I82401 Acute embolism and thrombosis of unspecified deep veins of right lower extremity: Secondary | ICD-10-CM

## 2013-04-07 DIAGNOSIS — I82509 Chronic embolism and thrombosis of unspecified deep veins of unspecified lower extremity: Secondary | ICD-10-CM

## 2013-04-07 LAB — PROTIME-INR

## 2013-04-07 MED ORDER — GADOBENATE DIMEGLUMINE 529 MG/ML IV SOLN
12.0000 mL | Freq: Once | INTRAVENOUS | Status: AC
  Administered 2013-04-07: 12 mL via INTRAVENOUS

## 2013-04-07 MED ORDER — GADOBENATE DIMEGLUMINE 529 MG/ML IV SOLN
12.0000 mL | Freq: Once | INTRAVENOUS | Status: AC | PRN
  Administered 2013-04-07: 12 mL via INTRAVENOUS

## 2013-04-07 MED ORDER — GADOBENATE DIMEGLUMINE 529 MG/ML IV SOLN: 15.0000 mL | Freq: Once | INTRAVENOUS | Status: DC

## 2013-04-08 ENCOUNTER — Other Ambulatory Visit (HOSPITAL_COMMUNITY): Payer: Medicare Other

## 2013-04-11 ENCOUNTER — Other Ambulatory Visit: Payer: Self-pay | Admitting: Medical Oncology

## 2013-04-11 DIAGNOSIS — C9000 Multiple myeloma not having achieved remission: Secondary | ICD-10-CM

## 2013-04-11 MED ORDER — POMALIDOMIDE 4 MG PO CAPS: 4.0000 mg | ORAL_CAPSULE | Freq: Every day | ORAL | Status: DC

## 2013-04-12 ENCOUNTER — Telehealth: Payer: Self-pay | Admitting: Medical Oncology

## 2013-04-12 NOTE — Telephone Encounter (Signed)
RECEIVED A FAX FROM [employer] CONCERNING A CONFIRMATION OF PRESCRIPTION SHIPMENT FOR POMALYST ON 04/11/13.

## 2013-04-12 NOTE — Telephone Encounter (Signed)
Reece Agar wants report from MRI-Note to Dr [name]

## 2013-04-13 ENCOUNTER — Telehealth: Payer: Self-pay | Admitting: *Deleted

## 2013-04-13 ENCOUNTER — Other Ambulatory Visit: Payer: Self-pay | Admitting: *Deleted

## 2013-04-13 ENCOUNTER — Telehealth: Payer: Self-pay

## 2013-04-13 DIAGNOSIS — C9000 Multiple myeloma not having achieved remission: Secondary | ICD-10-CM

## 2013-04-13 MED ORDER — OXYCODONE HCL 10 MG PO TB12: 10.0000 mg | ORAL_TABLET | Freq: Two times a day (BID) | ORAL | Status: DC

## 2013-04-13 NOTE — Telephone Encounter (Signed)
Pt's daughter Sue Lush called wanting to know the results of the MRI, per Dr Donnald Garre, it shows bursitis, nothing r/t her multiple myeloma, she needs to f/u with an orthopedic MD.  Allison Quarry

## 2013-04-13 NOTE — Telephone Encounter (Signed)
Pt recently had MRI of back and pt still having lower back and rt & lt hip pain; oncologist told pt to f/u with PCP, showed bursitis. Sue Lush scheduled appt with Dr Para March at 04/14/13 at 4 pm.

## 2013-04-14 ENCOUNTER — Other Ambulatory Visit: Payer: Medicare Other

## 2013-04-14 ENCOUNTER — Ambulatory Visit (INDEPENDENT_AMBULATORY_CARE_PROVIDER_SITE_OTHER): Payer: Medicare Other | Admitting: Family Medicine

## 2013-04-14 ENCOUNTER — Encounter: Payer: Self-pay | Admitting: Family Medicine

## 2013-04-14 ENCOUNTER — Ambulatory Visit (HOSPITAL_BASED_OUTPATIENT_CLINIC_OR_DEPARTMENT_OTHER): Payer: Medicare Other | Admitting: Pharmacist

## 2013-04-14 VITALS — BP 122/64 | HR 72 | Temp 98.4°F | Wt 135.8 lb

## 2013-04-14 DIAGNOSIS — M549 Dorsalgia, unspecified: Secondary | ICD-10-CM | POA: Insufficient documentation

## 2013-04-14 DIAGNOSIS — I82401 Acute embolism and thrombosis of unspecified deep veins of right lower extremity: Secondary | ICD-10-CM

## 2013-04-14 DIAGNOSIS — E119 Type 2 diabetes mellitus without complications: Secondary | ICD-10-CM

## 2013-04-14 DIAGNOSIS — I82409 Acute embolism and thrombosis of unspecified deep veins of unspecified lower extremity: Secondary | ICD-10-CM

## 2013-04-14 LAB — PROTIME-INR
INR: 2.7 (ref 2.00–3.50)
Protime: 32.4 Seconds — ABNORMAL HIGH (ref 10.6–13.4)

## 2013-04-14 NOTE — Patient Instructions (Addendum)
Continue Coumadin 2.5 mg daily except 5 mg on  Su/Mon/Wed. Recheck PT/INR in 2 weeks on 04/28/13: 1:15pm for lab, 1:30pm for coumadin and 1:45pm for Apt with Adrena.

## 2013-04-14 NOTE — Assessment & Plan Note (Signed)
Multiple causes likely, d/w pt.  Pain improves for about 2 days after decadron dosing.  Pain with ROM. MRI reviewed with patient and family.  She has sedation at higher dose of opiates.  A1c pending today.  Will ask ONC about mid cycle dosing of pred.  This may be the best option. Will be in touch with pt when I get a response.

## 2013-04-14 NOTE — Progress Notes (Signed)
INR within goal today. No medication changes to report. No missed coumadin doses. No changes in diet. New Pomalyst cycle started on 04/12/13. Will continue Coumadin 2.5 mg daily except 5 mg on Su/Mon/Wed. Recheck PT/INR in 2 weeks on 04/28/13: 1:15pm for lab, 1:30pm for coumadin and 1:45pm for Apt with Adrena.

## 2013-04-14 NOTE — Assessment & Plan Note (Signed)
Check A1c today and notify pt.

## 2013-04-14 NOTE — Patient Instructions (Addendum)
Go to the lab on the way out.  We'll contact you with your lab report. Let me talk to Dr. [name] about your meds in the meantime.   Take care.

## 2013-04-14 NOTE — Progress Notes (Signed)
Pain in B lower back and L hip, near the L greater troch.  Pain improves for about 2 days after decadron dosing.  Pain with ROM. MRI reviewed with patient and family.  She has sedation at higher dose of opiates.  A1c pending today.  Discussed options.   DM2- sugars note checked recently.  No recent changes in meds.  Due for A1c.   Meds, vitals, and allergies reviewed.   ROS: See HPI.  Otherwise, noncontributory.  nad ncat rrr ctab abd soft L greater troch ttp and ttp across the B lower back.  In wheelchair due to back pain.

## 2013-04-15 ENCOUNTER — Telehealth: Payer: Self-pay | Admitting: Family Medicine

## 2013-04-15 ENCOUNTER — Telehealth: Payer: Self-pay | Admitting: Medical Oncology

## 2013-04-15 LAB — HEMOGLOBIN A1C: Hgb A1c MFr Bld: 6.7 % — ABNORMAL HIGH (ref 4.6–6.5)

## 2013-04-15 MED ORDER — PREDNISONE 20 MG PO TABS: 20.0000 mg | ORAL_TABLET | ORAL | Status: DC

## 2013-04-15 NOTE — Telephone Encounter (Signed)
Message copied by Charma Igo on Fri Apr 15, 2013 10:56 AM ------      Message from: Si Gaul      Created: Tue Apr 12, 2013  8:25 PM       OK. Nothing serious      ----- Message -----         From: Charma Igo, RN         Sent: 04/12/2013   3:05 PM           To: Si Gaul, MD            Daughter Wants MRI report-next visit 8/28       ------

## 2013-04-15 NOTE — Telephone Encounter (Signed)
rx sent for prednisone.  See lab results.

## 2013-04-15 NOTE — Telephone Encounter (Signed)
done

## 2013-04-17 ENCOUNTER — Other Ambulatory Visit: Payer: Self-pay | Admitting: Internal Medicine

## 2013-04-21 ENCOUNTER — Other Ambulatory Visit (HOSPITAL_BASED_OUTPATIENT_CLINIC_OR_DEPARTMENT_OTHER): Payer: Medicare Other | Admitting: Lab

## 2013-04-21 DIAGNOSIS — I82409 Acute embolism and thrombosis of unspecified deep veins of unspecified lower extremity: Secondary | ICD-10-CM

## 2013-04-21 DIAGNOSIS — I82401 Acute embolism and thrombosis of unspecified deep veins of right lower extremity: Secondary | ICD-10-CM

## 2013-04-26 ENCOUNTER — Other Ambulatory Visit: Payer: Self-pay | Admitting: *Deleted

## 2013-04-26 ENCOUNTER — Encounter: Payer: Self-pay | Admitting: Internal Medicine

## 2013-04-26 DIAGNOSIS — C9000 Multiple myeloma not having achieved remission: Secondary | ICD-10-CM

## 2013-04-26 MED ORDER — OXYCODONE HCL 5 MG PO TABS: ORAL_TABLET | ORAL | Status: DC

## 2013-04-26 NOTE — Progress Notes (Signed)
Put daughter's fmla form on nurse's desk. °

## 2013-04-27 ENCOUNTER — Other Ambulatory Visit: Payer: Self-pay | Admitting: Internal Medicine

## 2013-04-28 ENCOUNTER — Other Ambulatory Visit (HOSPITAL_BASED_OUTPATIENT_CLINIC_OR_DEPARTMENT_OTHER): Payer: Medicare Other

## 2013-04-28 ENCOUNTER — Ambulatory Visit: Payer: Medicare Other | Admitting: Pharmacist

## 2013-04-28 ENCOUNTER — Other Ambulatory Visit: Payer: Self-pay | Admitting: Physician Assistant

## 2013-04-28 ENCOUNTER — Ambulatory Visit (HOSPITAL_BASED_OUTPATIENT_CLINIC_OR_DEPARTMENT_OTHER): Payer: Medicare Other | Admitting: Physician Assistant

## 2013-04-28 VITALS — BP 143/70 | HR 68 | Temp 98.6°F | Resp 18 | Ht 60.0 in | Wt 138.8 lb

## 2013-04-28 DIAGNOSIS — C9000 Multiple myeloma not having achieved remission: Secondary | ICD-10-CM

## 2013-04-28 DIAGNOSIS — I82409 Acute embolism and thrombosis of unspecified deep veins of unspecified lower extremity: Secondary | ICD-10-CM

## 2013-04-28 DIAGNOSIS — I82401 Acute embolism and thrombosis of unspecified deep veins of right lower extremity: Secondary | ICD-10-CM

## 2013-04-28 LAB — COMPREHENSIVE METABOLIC PANEL (CC13)
ALT: 16 U/L (ref 0–55)
Albumin: 3 g/dL — ABNORMAL LOW (ref 3.5–5.0)
Alkaline Phosphatase: 53 U/L (ref 40–150)
CO2: 23 mEq/L (ref 22–29)
Glucose: 196 mg/dl — ABNORMAL HIGH (ref 70–140)
Potassium: 3.6 mEq/L (ref 3.5–5.1)
Sodium: 144 mEq/L (ref 136–145)
Total Protein: 5.9 g/dL — ABNORMAL LOW (ref 6.4–8.3)

## 2013-04-28 LAB — PROTIME-INR
INR: 3.6 — ABNORMAL HIGH (ref 2.00–3.50)
Protime: 43.2 Seconds — ABNORMAL HIGH (ref 10.6–13.4)

## 2013-04-28 LAB — LACTATE DEHYDROGENASE (CC13): LDH: 228 U/L (ref 125–245)

## 2013-04-28 LAB — CBC WITH DIFFERENTIAL/PLATELET
BASO%: 1.3 % (ref 0.0–2.0)
Eosinophils Absolute: 0.2 10*3/uL (ref 0.0–0.5)
MCHC: 32.6 g/dL (ref 31.5–36.0)
MONO#: 0.7 10*3/uL (ref 0.1–0.9)
NEUT#: 4.1 10*3/uL (ref 1.5–6.5)
RBC: 3.51 10*6/uL — ABNORMAL LOW (ref 3.70–5.45)
RDW: 16.7 % — ABNORMAL HIGH (ref 11.2–14.5)
WBC: 5.8 10*3/uL (ref 3.9–10.3)

## 2013-04-28 LAB — POCT INR: INR: 3.6

## 2013-04-28 MED ORDER — ALPRAZOLAM 0.5 MG PO TABS: ORAL_TABLET | ORAL | Status: DC

## 2013-04-28 NOTE — Patient Instructions (Addendum)
Coumadin 2.5mg  daily except 5mg  on Mon/Wed/Fri Recheck PT/INR in 2 weeks on 05/12/13: 1:00pm for lab, 1:30pm for infusion and a coumadin clinic pharmacist will see you during Zometa infusion.

## 2013-04-28 NOTE — Progress Notes (Signed)
Pt seen in clinic today with nieces. She also has appmt with AJ INR=3.6 She started on weekly prednisone 20mg  on Mondays No other changes.   Will hold todays dose.  Then resume Coumadin 2.5mg  daily except 5mg  on Mon/Wed/Fri Recheck PT/INR in 2 weeks on 05/12/13: 1:00pm for lab, 1:30pm for infusion and a coumadin clinic pharmacist will see you during Zometa infusion.

## 2013-04-28 NOTE — Telephone Encounter (Signed)
Gave pt appt for lab, Zometa and ML September 2014

## 2013-05-03 ENCOUNTER — Other Ambulatory Visit: Payer: Self-pay | Admitting: *Deleted

## 2013-05-03 ENCOUNTER — Encounter: Payer: Self-pay | Admitting: Internal Medicine

## 2013-05-03 DIAGNOSIS — C9000 Multiple myeloma not having achieved remission: Secondary | ICD-10-CM

## 2013-05-03 MED ORDER — ALPRAZOLAM 0.5 MG PO TABS: ORAL_TABLET | ORAL | Status: DC

## 2013-05-03 NOTE — Progress Notes (Signed)
Put daughter's fmla form in registration desk. °

## 2013-05-03 NOTE — Progress Notes (Signed)
[facility] [phone]   [phone]  OFFICE PROGRESS NOTE  [name], MD [address] [place] [address]  DIAGNOSIS:  1. multiple myeloma diagnosed in September 2009,  2. history of bilateral lower extremity deep vein thrombosis diagnosed in November 2009  3. acute on chronic deep vein thrombosis in the left lower extremity diagnosed in October 2010.   PRIOR THERAPY:  1. status post palliative radiotherapy to the right femur and is she'll tuberosity, first was done in December 2011 and the second treatment was completed Jan 08 2011, and the third treatment to the right distal femur completed on 04/30/2011  2. status post palliative radiotherapy to the left proximal femur completed 02/28/2011.  3. Revlimid 25 mg by mouth daily for 21 days every 4 weeks in addition to oral Decadron at 40 mg by mouth on a weekly basis status post 40 cycles.  4. weekly subcutaneous Velcade 1.3 mg/M2 status post 60 cycles, discontinued today secondary to mild disease progression.   CURRENT THERAPY:  1. Pomalyst 4 mg by mouth daily for 21 days every 4 weeks, the patient started the first dose on 12/22/2012 , in addition to Decadron 40 mg weekly.  2. Coumadin 5 mg on Mondays and Thursdays and 2.5 mg on all other days.  3. Zometa 4 mg IV given every 2 months.    INTERVAL HISTORY: [name] 77 y.o. female returns to the clinic today for followup visit accompanied by her 2 daughters. She is tolerating her Pomalyst and Decadron without difficulty. She continues to complain of left hip pain but then states it is actually bilateral hip pain and low back pain. An MRI of her lumbar spine and pelvis on 04/08/2013 revealed stable multiple myeloma lesion in the upper sacrum, no fracture and no other lesions. There was bilateral gluteal mediastinitis and left trochanteric bursitis. She has since been started on prednisone 20 mg by mouth once weekly by her primary  care physician, Dr. Para March. She notes some paresthesias affecting her fingers. She continues to maintain fine motor coordination and dexterity.  She denied having any other significant complaints. The patient has no chest pain, shortness breath, cough or hemoptysis. She denied having any significant weight loss or night sweats. She requests a refill for her Xanax tablets.   MEDICAL HISTORY: Past Medical History  Diagnosis Date  . Blood transfusion   . Depression     Mild  . History of chicken pox   . Hyperlipidemia   . Hypertension   . Degenerative joint disease   . Diabetes mellitus     Steroid related  . Phlebitis   . Multiple myeloma     per Dr. [name], s/p palliative radiation for leg pain 2011 per Dr. Roselind Messier  . Deep vein thrombosis of bilateral lower extremities     ALLERGIES:  has No Known Allergies.  MEDICATIONS:  Current Outpatient Prescriptions  Medication Sig Dispense Refill  . acyclovir (ZOVIRAX) 400 MG tablet TAKE ONE TABLET BY MOUTH TWICE DAILY  60 tablet  0  . ALPRAZolam (XANAX) 0.5 MG tablet TAKE ONE TABLET BY MOUTH THREE TIMES DAILY AS NEEDED AND FOR SLEEP  60 tablet  0  . Calcium Carbonate-Vitamin D 600-400 MG-UNIT per tablet Take 1 tablet by mouth 3 (three) times daily with meals.        Marland Kitchen dexamethasone (DECADRON) 4 MG tablet TAKE 10 TABLETS BY MOUTH ONCE PER WEEK  40 tablet  0  .  gabapentin (NEURONTIN) 100 MG capsule TAKE ONE TO FOUR CAPSULES BY MOUTH THREE TIMES DAILY  360 capsule  3  . glipiZIDE (GLUCOTROL XL) 2.5 MG 24 hr tablet Take 1 tablet (2.5 mg total) by mouth daily.  90 tablet  3  . glucose blood (ONE TOUCH ULTRA TEST) test strip Use as instructed  25 each  6  . ibuprofen (ADVIL,MOTRIN) 200 MG tablet Take 200 mg by mouth every 6 (six) hours as needed.        . Lancets (ONETOUCH ULTRASOFT) lancets Test blood sugar once daily or as directed.  100 each  11  . lidocaine (LIDODERM) 5 % Place 1 patch onto the skin daily. Remove & Discard patch within 12  hours or as directed by MD  30 patch  0  . lisinopril-hydrochlorothiazide (PRINZIDE,ZESTORETIC) 20-12.5 MG per tablet Take 0.5 tablets by mouth daily.  45 tablet  3  . magnesium oxide (MAG-OX) 400 MG tablet Take 400 mg by mouth daily.       Marland Kitchen oxyCODONE (OXY IR/ROXICODONE) 5 MG immediate release tablet Take 1 1/2 tabs by mouth every 6 hours as needed for pain  90 tablet  0  . oxyCODONE (OXYCONTIN) 10 MG 12 hr tablet Take 1 tablet (10 mg total) by mouth every 12 (twelve) hours.  60 tablet  0  . pomalidomide (POMALYST) 4 MG capsule Take 1 capsule (4 mg total) by mouth daily. Take with water on days 1-21. Repeat every 28 days.04/11/13-pomalyst authorization number [id number] adult female not of childbearing potential  21 capsule  0  . potassium chloride SA (K-DUR,KLOR-CON) 20 MEQ tablet Take 20 mEq by mouth daily as needed.      . predniSONE (DELTASONE) 20 MG tablet Take 1 tablet (20 mg total) by mouth once a week. On Monday.  Take with food.  12 tablet  1  . prochlorperazine (COMPAZINE) 10 MG tablet Take 1 tablet (10 mg total) by mouth every 6 (six) hours as needed.  30 tablet  1  . temazepam (RESTORIL) 30 MG capsule TAKE ONE CAPSULE BY MOUTH AT BEDTIME  30 capsule  0  . warfarin (COUMADIN) 5 MG tablet TAKE ONE TABLET BY MOUTH ONCE DAILY ON TUESDAY, THURSDAY AND SATURDAY.  TAKE 1/2 TABLET ON ALL OTHER DAYS OR AS DIRECTED  60 tablet  0   No current facility-administered medications for this visit.   Facility-Administered Medications Ordered in Other Visits  Medication Dose Route Frequency Provider Last Rate Last Dose  . Zoledronic Acid (ZOMETA) 4 mg IVPB  4 mg Intravenous Once Si Gaul, MD        SURGICAL HISTORY:  Past Surgical History  Procedure Laterality Date  . Abdominal hysterectomy      REVIEW OF SYSTEMS:  A comprehensive review of systems was negative except for: Musculoskeletal: positive for Left hip pain   PHYSICAL EXAMINATION: General appearance: alert, cooperative and no  distress Head: Normocephalic, without obvious abnormality, atraumatic Neck: no adenopathy Lymph nodes: Cervical, supraclavicular, and axillary nodes normal. Resp: clear to auscultation bilaterally Back: symmetric, no curvature. ROM normal. No CVA tenderness. Cardio: regular rate and rhythm, S1, S2 normal, no murmur, click, rub or gallop GI: soft, non-tender; bowel sounds normal; no masses,  no organomegaly Extremities: extremities normal, atraumatic, no cyanosis or edema and Point tenderness of the greater trochanteric area bilaterally as well as the lower lumbar spine. Neurologic: Alert and oriented X 3, normal strength and tone. Normal symmetric reflexes. Normal coordination and gait  ECOG PERFORMANCE STATUS: 1 -  Symptomatic but completely ambulatory  Blood pressure 143/70, pulse 68, temperature 98.6 F (37 C), temperature source Oral, resp. rate 18, height 5' (1.524 m), weight 138 lb 12.8 oz (62.959 kg).  LABORATORY DATA: Lab Results  Component Value Date   WBC 5.8 04/28/2013   HGB 11.1* 04/28/2013   HCT 34.1* 04/28/2013   MCV 97.2 04/28/2013   PLT 203 04/28/2013      Chemistry      Component Value Date/Time   NA 144 04/28/2013 1319   NA 141 06/24/2012 1620   K 3.6 04/28/2013 1319   K 3.1* 06/24/2012 1620   CL 107 02/14/2013 1311   CL 101 06/24/2012 1620   CO2 23 04/28/2013 1319   CO2 32 06/24/2012 1620   BUN 13.4 04/28/2013 1319   BUN 15 06/24/2012 1620   CREATININE 1.0 04/28/2013 1319   CREATININE 0.96 06/24/2012 1620      Component Value Date/Time   CALCIUM 9.7 04/28/2013 1319   CALCIUM 9.4 06/24/2012 1620   CALCIUM  Value: 5.7 CRITICAL RESULT CALLED TO, READ BACK BY AND VERIFIED WITH: JAMIE TRACY,RN [id number] @ 1433 BY J SCOTTON (NOTE)  Amended report. Result repeated and verified. CORRECTED ON 10/14 AT 1429: PREVIOUSLY REPORTED AS Result repeated and verified.* 06/13/2008 1605   ALKPHOS 53 04/28/2013 1319   ALKPHOS 50 06/24/2012 1620   AST 11 04/28/2013 1319   AST 19  06/24/2012 1620   ALT 16 04/28/2013 1319   ALT 15 06/24/2012 1620   BILITOT 0.32 04/28/2013 1319   BILITOT 0.2* 06/24/2012 1620       RADIOGRAPHIC STUDIES:  Mr Lumbar Spine W Wo Contrast  04/07/2013   *RADIOLOGY REPORT*  Clinical Data: 77 year old female with stage IV multiple myeloma. Low back pain, bilateral hip and lower extremity pain.  MRI LUMBAR SPINE WITHOUT AND WITH CONTRAST  Technique:  Multiplanar and multiecho pulse sequences of the lumbar spine were obtained without and with intravenous contrast.  Contrast: 12mL MULTIHANCE GADOBENATE DIMEGLUMINE 529 MG/ML IV SOLN  Comparison: 09/23/2011.  Findings: Stable vertebral height and alignment and 2013.  Moderate to severe L3 and L4 pathologic compression fractures.  Mild L5 compression fracture.  Enhancing T2 hyperintense lesion occupying the sacrum at S1 has not significantly changed.  There is mildly increased posterior inferior L1 endplate edema and enhancement.  Bone marrow signal at the intact vertebrae has not significantly changed. No new bony lesion in the lumbar spine.   Visualized lower thoracic spinal cord is normal with conus medularis at L1.  No abnormal intradural enhancement.  Cauda equina nerve roots within normal limits.  Negative visualized abdominal viscera.  T10-T11:  Stable and negative.  T11-T12:  Stable disc bulge with borderline to mild spinal stenosis.  Stable right greater than left facet hypertrophy with moderate T11 foraminal stenosis.  T12-L1:  Stable mild disc bulge.  Stable moderate facet hypertrophy.  Stable mild T12 foraminal stenosis.  L1-L2:  Stable mild disc bulge and facet hypertrophy.  Stable mild L1 foraminal stenosis.  L2-L3:  Stable moderate circumferential disc bulge facet and ligament flavum hypertrophy.  Mild to moderate spinal stenosis is not significantly changed.  Mild to moderate foraminal stenosis is not significantly changed.  L3-L4:  Left eccentric moderate circumferential disc bulge with moderate  facet and ligament flavum hypertrophy is not significantly changed.  Mild to moderate spinal stenosis is not significantly changed.  Moderate to severe left L3 foraminal stenosis likewise is stable.  L4-L5:  Left eccentric moderate circumferential disc bulge plus mild  facet and ligament flavum hypertrophy is stable.  No significant spinal stenosis.  Stable moderate left lateral recess stenosis.  Stable moderate bilateral L4 foraminal stenosis.  L5-S1:  Stable left eccentric circumferential disc osteophyte complex.  Stable mild to moderate facet and ligament flavum hypertrophy greater on the left.  Moderate left lateral recess stenosis is stable.  Moderate to severe left greater than right L5 foraminal stenosis is stable.  IMPRESSION: 1.  Mildly increased marrow edema and enhancement at the posterior inferior L1 endplate.  This could be degenerative or related to focal increased multiple myeloma. 2.  Otherwise the lumbar spine and visible sacrum is unchanged since 09/23/2011.   Original Report Authenticated By: Erskine Speed, M.D.   Mr Pelvis W Wo Contrast  04/08/2013   *RADIOLOGY REPORT*  Clinical Data: Multiple myeloma with back and bilateral leg pain.  MRI PELVIS WITHOUT AND WITH CONTRAST  Technique:  Multiplanar multisequence MR imaging of the pelvis was performed both before and after administration of intravenous contrast.  Contrast: 12mL MULTIHANCE GADOBENATE DIMEGLUMINE 529 MG/ML IV SOLN  Comparison: Lumbar spine MRI 09/23/2011.  Findings: There is a stable large myelomatous lesion involving the sacrum at S1.  This demonstrates fluid signal intensity and irregular nodular enhancement.  This appears relatively stable.  I do not see an associated pathologic fracture.  There are remote superior and inferior pubic rami fractures on the right side but I do not see any other myelomatous bone lesions.  Moderate hip joint degenerative changes bilaterally.  There is a benign intraosseous lipoma involving the left  femoral head.  There is bilateral gluteus medius tendonitis and left-sided trochanteric bursitis.  Otherwise, the surrounding hip and pelvic musculature are unremarkable.  No significant intrapelvic abnormalities are demonstrated. Multiple uterine fibroids are noted.  IMPRESSION:  1.  Stable appearing myelomatous lesion involving the upper sacrum without evidence of a new pathologic fracture. 2.  No other definite myelomatous lesions involving the pelvis or hips. 3.  Bilateral gluteus medius tendonitis and left-sided trochanteric bursitis.   Original Report Authenticated By: Rudie Meyer, M.D.   ASSESSMENT AND PLAN: This is a very pleasant 77 years old [place] American female with history of multiple myeloma diagnosed in September of 2009 has been on several chemotherapy regimen and currently on Pomalyst milligram by mouth daily for 21 days every 4 weeks in addition to weekly Decadron.Her most recent myeloma panel showed no evidence for disease progression. She will continue with her treatment was Pomalyst as scheduled. MRI of the lumbar spine and pelvis as described above was significant for bilateral gluteus medius tendinitis and left trochanteric bursitis. She will continue on her once weekly prednisone 20 mg as prescribed by Dr. Para March for this inflammation. We'll continue to monitor her complaints of peripheral neuropathy. She will continue with her Zometa for her bone lesions every 2 months as scheduled. She'll followup in one month for another symptom management visit regarding her Pomalyst and Decadron therapy with a repeat CBC differential, C. met and LDH.   Lindsay Menge E, PA-C   She was advised to call immediately if she has any concerning symptoms in the interval.  All questions were answered. The patient knows to call the clinic with any problems, questions or concerns. We can certainly see the patient much sooner if necessary.  ADDENDUM: Hematology/Oncology Attending: I have face to  face encounter with the patient today. I recommended her care plan. The patient continues to complain of pain in the hips bilaterally more on the left  side. Her previous x-ray of the left hip showed no suspicious lytic lesions. I recommended for her to have MRI of the lumbar spine and hips for further evaluation of her persistent pain. She will continue with her current pain medication. She would come back for follow up visit in 3 weeks or sooner if there is any significant abnormality in her MRI.  Lajuana Matte., MD 03/31/2013

## 2013-05-03 NOTE — Patient Instructions (Addendum)
Continue Pomalyst as prescribed Continue Coumadin as instructed Follow up in 1 month

## 2013-05-05 ENCOUNTER — Other Ambulatory Visit: Payer: Medicare Other | Admitting: Lab

## 2013-05-05 ENCOUNTER — Other Ambulatory Visit: Payer: Self-pay | Admitting: Medical Oncology

## 2013-05-05 ENCOUNTER — Telehealth: Payer: Self-pay | Admitting: *Deleted

## 2013-05-05 NOTE — Telephone Encounter (Signed)
Lm informing the pt daughter that her mothers labs for 9/4 and 9/18 are cancel....td

## 2013-05-05 NOTE — Progress Notes (Signed)
I told [name] that pt does not need weekly labs and we will cancel them .She understands to keep lab appts associated with MD or coumadin clinic visits.

## 2013-05-06 ENCOUNTER — Telehealth: Payer: Self-pay | Admitting: Medical Oncology

## 2013-05-06 DIAGNOSIS — C9000 Multiple myeloma not having achieved remission: Secondary | ICD-10-CM

## 2013-05-06 MED ORDER — POMALIDOMIDE 4 MG PO CAPS: 4.0000 mg | ORAL_CAPSULE | Freq: Every day | ORAL | Status: DC

## 2013-05-06 NOTE — Telephone Encounter (Signed)
pomalyst refill faxed.

## 2013-05-10 NOTE — Telephone Encounter (Signed)
RECEIVED A FAX FROM [facility] CONCERNING A CONFIRMATION OF PRESCRIPTION SHIPMENT FOR POMALYST ON 05/09/13.

## 2013-05-11 ENCOUNTER — Other Ambulatory Visit: Payer: Self-pay | Admitting: Internal Medicine

## 2013-05-12 ENCOUNTER — Other Ambulatory Visit: Payer: Medicare Other | Admitting: Lab

## 2013-05-12 ENCOUNTER — Other Ambulatory Visit: Payer: Self-pay | Admitting: Internal Medicine

## 2013-05-12 ENCOUNTER — Ambulatory Visit (HOSPITAL_BASED_OUTPATIENT_CLINIC_OR_DEPARTMENT_OTHER): Payer: Medicare Other | Admitting: Pharmacist

## 2013-05-12 ENCOUNTER — Ambulatory Visit (HOSPITAL_BASED_OUTPATIENT_CLINIC_OR_DEPARTMENT_OTHER): Payer: Medicare Other

## 2013-05-12 VITALS — BP 103/49 | HR 73 | Temp 98.8°F

## 2013-05-12 DIAGNOSIS — I82401 Acute embolism and thrombosis of unspecified deep veins of right lower extremity: Secondary | ICD-10-CM

## 2013-05-12 DIAGNOSIS — I82402 Acute embolism and thrombosis of unspecified deep veins of left lower extremity: Secondary | ICD-10-CM | POA: Insufficient documentation

## 2013-05-12 DIAGNOSIS — I82409 Acute embolism and thrombosis of unspecified deep veins of unspecified lower extremity: Secondary | ICD-10-CM

## 2013-05-12 DIAGNOSIS — C9000 Multiple myeloma not having achieved remission: Secondary | ICD-10-CM

## 2013-05-12 LAB — POCT INR: INR: 3.2

## 2013-05-12 MED ORDER — SODIUM CHLORIDE 0.9 % IV SOLN
Freq: Once | INTRAVENOUS | Status: AC
  Administered 2013-05-12: 14:00:00 via INTRAVENOUS

## 2013-05-12 MED ORDER — ZOLEDRONIC ACID 4 MG/100ML IV SOLN
4.0000 mg | Freq: Once | INTRAVENOUS | Status: AC
  Administered 2013-05-12: 4 mg via INTRAVENOUS
  Filled 2013-05-12: qty 100

## 2013-05-12 MED ORDER — ZOLEDRONIC ACID 4 MG/5ML IV CONC: 4.0000 mg | Freq: Once | INTRAVENOUS | Status: DC

## 2013-05-12 NOTE — Patient Instructions (Addendum)
Zoledronic Acid injection (Hypercalcemia, Oncology) What is this medicine? ZOLEDRONIC ACID (ZOE le dron ik AS id) lowers the amount of calcium loss from bone. It is used to treat too much calcium in your blood from cancer. It is also used to prevent complications of cancer that has spread to the bone. This medicine may be used for other purposes; ask your health care provider or pharmacist if you have questions. What should I tell my health care provider before I take this medicine? They need to know if you have any of these conditions: -aspirin-sensitive asthma -dental disease -kidney disease -an unusual or allergic reaction to zoledronic acid, other medicines, foods, dyes, or preservatives -pregnant or trying to get pregnant -breast-feeding How should I use this medicine? This medicine is for infusion into a vein. It is given by a health care professional in a hospital or clinic setting. Talk to your pediatrician regarding the use of this medicine in children. Special care may be needed. Overdosage: If you think you have taken too much of this medicine contact a poison control center or emergency room at once. NOTE: This medicine is only for you. Do not share this medicine with others. What if I miss a dose? It is important not to miss your dose. Call your doctor or health care professional if you are unable to keep an appointment. What may interact with this medicine? -certain antibiotics given by injection -NSAIDs, medicines for pain and inflammation, like ibuprofen or naproxen -some diuretics like bumetanide, furosemide -teriparatide -thalidomide This list may not describe all possible interactions. Give your health care provider a list of all the medicines, herbs, non-prescription drugs, or dietary supplements you use. Also tell them if you smoke, drink alcohol, or use illegal drugs. Some items may interact with your medicine. What should I watch for while using this medicine? Visit  your doctor or health care professional for regular checkups. It may be some time before you see the benefit from this medicine. Do not stop taking your medicine unless your doctor tells you to. Your doctor may order blood tests or other tests to see how you are doing. Women should inform their doctor if they wish to become pregnant or think they might be pregnant. There is a potential for serious side effects to an unborn child. Talk to your health care professional or pharmacist for more information. You should make sure that you get enough calcium and vitamin D while you are taking this medicine. Discuss the foods you eat and the vitamins you take with your health care professional. Some people who take this medicine have severe bone, joint, and/or muscle pain. This medicine may also increase your risk for a broken thigh bone. Tell your doctor right away if you have pain in your upper leg or groin. Tell your doctor if you have any pain that does not go away or that gets worse. What side effects may I notice from receiving this medicine? Side effects that you should report to your doctor or health care professional as soon as possible: -allergic reactions like skin rash, itching or hives, swelling of the face, lips, or tongue -anxiety, confusion, or depression -breathing problems -changes in vision -feeling faint or lightheaded, falls -jaw burning, cramping, pain -muscle cramps, stiffness, or weakness -trouble passing urine or change in the amount of urine Side effects that usually do not require medical attention (report to your doctor or health care professional if they continue or are bothersome): -bone, joint, or muscle pain -  fever -hair loss -irritation at site where injected -loss of appetite -nausea, vomiting -stomach upset -tired This list may not describe all possible side effects. Call your doctor for medical advice about side effects. You may report side effects to FDA at  1-800-FDA-1088. Where should I keep my medicine? This drug is given in a hospital or clinic and will not be stored at home. NOTE: This sheet is a summary. It may not cover all possible information. If you have questions about this medicine, talk to your doctor, pharmacist, or health care provider.  2012, [employer]/Gold Standard. (02/14/2011 9:06:58 AM) 

## 2013-05-12 NOTE — Progress Notes (Signed)
INR slightly above goal today. Pt held coumadin as instructed at last visit. No additional missed coumadin doses. No changes in medications or diet. Pt started a new cycle of Pomalyst on 05/11/13. She remains on Prednisone 20mg  on Mondays (started in Mid-August). No problems to report regarding anticoagulation. Will slightly decrease coumadin to 2.5mg  daily except 5mg  on Mon&Wed Recheck PT/INR in 2 weeks on 05/26/13: lab at 1:45pm,  [name] at 2:15pm and coumadin clinic at 2:45pm.

## 2013-05-12 NOTE — Patient Instructions (Addendum)
Slightly decrease coumadin to 2.5mg  daily except 5mg  on Mon&Wed Recheck PT/INR in 2 weeks on 05/26/13: lab at 1:45pm,  Adrena at 2:15pm and coumadin clinic at 2:45pm.

## 2013-05-16 ENCOUNTER — Other Ambulatory Visit: Payer: Self-pay | Admitting: Medical Oncology

## 2013-05-16 DIAGNOSIS — C9000 Multiple myeloma not having achieved remission: Secondary | ICD-10-CM

## 2013-05-16 MED ORDER — OXYCODONE HCL 10 MG PO TB12: 10.0000 mg | ORAL_TABLET | Freq: Two times a day (BID) | ORAL | Status: DC

## 2013-05-16 NOTE — Telephone Encounter (Signed)
rx given to [place] for auth and locked in injection room.

## 2013-05-17 ENCOUNTER — Other Ambulatory Visit: Payer: Self-pay | Admitting: *Deleted

## 2013-05-17 DIAGNOSIS — C9000 Multiple myeloma not having achieved remission: Secondary | ICD-10-CM

## 2013-05-17 MED ORDER — OXYCODONE HCL 5 MG PO TABS: ORAL_TABLET | ORAL | Status: DC

## 2013-05-19 ENCOUNTER — Other Ambulatory Visit: Payer: Medicare Other

## 2013-05-24 ENCOUNTER — Other Ambulatory Visit: Payer: Self-pay | Admitting: Internal Medicine

## 2013-05-26 ENCOUNTER — Other Ambulatory Visit: Payer: Self-pay

## 2013-05-26 ENCOUNTER — Encounter: Payer: Self-pay | Admitting: Physician Assistant

## 2013-05-26 ENCOUNTER — Other Ambulatory Visit: Payer: Self-pay | Admitting: Internal Medicine

## 2013-05-26 ENCOUNTER — Other Ambulatory Visit (HOSPITAL_BASED_OUTPATIENT_CLINIC_OR_DEPARTMENT_OTHER): Payer: Medicare Other | Admitting: Lab

## 2013-05-26 ENCOUNTER — Telehealth: Payer: Self-pay | Admitting: Internal Medicine

## 2013-05-26 ENCOUNTER — Ambulatory Visit (HOSPITAL_BASED_OUTPATIENT_CLINIC_OR_DEPARTMENT_OTHER): Payer: Medicare Other | Admitting: Pharmacist

## 2013-05-26 ENCOUNTER — Ambulatory Visit (HOSPITAL_BASED_OUTPATIENT_CLINIC_OR_DEPARTMENT_OTHER): Payer: Medicare Other | Admitting: Physician Assistant

## 2013-05-26 VITALS — BP 142/61 | HR 77 | Temp 98.8°F | Resp 18 | Ht 60.0 in | Wt 136.3 lb

## 2013-05-26 DIAGNOSIS — I82409 Acute embolism and thrombosis of unspecified deep veins of unspecified lower extremity: Secondary | ICD-10-CM

## 2013-05-26 DIAGNOSIS — I82401 Acute embolism and thrombosis of unspecified deep veins of right lower extremity: Secondary | ICD-10-CM

## 2013-05-26 DIAGNOSIS — Z86718 Personal history of other venous thrombosis and embolism: Secondary | ICD-10-CM

## 2013-05-26 DIAGNOSIS — C9000 Multiple myeloma not having achieved remission: Secondary | ICD-10-CM

## 2013-05-26 DIAGNOSIS — Z7901 Long term (current) use of anticoagulants: Secondary | ICD-10-CM

## 2013-05-26 DIAGNOSIS — G609 Hereditary and idiopathic neuropathy, unspecified: Secondary | ICD-10-CM

## 2013-05-26 LAB — CBC WITH DIFFERENTIAL/PLATELET
BASO%: 2.1 % — ABNORMAL HIGH (ref 0.0–2.0)
EOS%: 11.4 % — ABNORMAL HIGH (ref 0.0–7.0)
HCT: 35 % (ref 34.8–46.6)
MCH: 32.3 pg (ref 25.1–34.0)
MCHC: 33.1 g/dL (ref 31.5–36.0)
MONO#: 0.7 10*3/uL (ref 0.1–0.9)
NEUT%: 54.5 % (ref 38.4–76.8)
RDW: 16.6 % — ABNORMAL HIGH (ref 11.2–14.5)
WBC: 4.9 10*3/uL (ref 3.9–10.3)
lymph#: 0.9 10*3/uL (ref 0.9–3.3)

## 2013-05-26 LAB — COMPREHENSIVE METABOLIC PANEL (CC13)
ALT: 19 U/L (ref 0–55)
AST: 15 U/L (ref 5–34)
Albumin: 3.2 g/dL — ABNORMAL LOW (ref 3.5–5.0)
CO2: 25 mEq/L (ref 22–29)
Calcium: 9.6 mg/dL (ref 8.4–10.4)
Chloride: 107 mEq/L (ref 98–109)
Creatinine: 1.1 mg/dL (ref 0.6–1.1)
Potassium: 3.8 mEq/L (ref 3.5–5.1)
Sodium: 141 mEq/L (ref 136–145)
Total Protein: 6.5 g/dL (ref 6.4–8.3)

## 2013-05-26 LAB — PROTIME-INR: INR: 3.1 (ref 2.00–3.50)

## 2013-05-26 LAB — LACTATE DEHYDROGENASE (CC13): LDH: 240 U/L (ref 125–245)

## 2013-05-26 MED ORDER — TEMAZEPAM 30 MG PO CAPS: ORAL_CAPSULE | ORAL | Status: DC

## 2013-05-26 NOTE — Patient Instructions (Signed)
Continue coumadin to 2.5mg  daily except 5mg  on Mon&Wed Recheck PT/INR in 3 weeks on 06/16/13: lab at 1:30pm and coumadin clinic at 1:45pm

## 2013-05-26 NOTE — Progress Notes (Signed)
Pt seen in clinic today with [place] after she saw Adrena INR=3.1 No changes to report Continue coumadin to 2.5mg  daily except 5mg  on Mon&Wed Recheck PT/INR in 3 weeks on 06/16/13: lab at 1:30pm and coumadin clinic at 1:45pm  Adrena also getting her protein studies done same day as this lab (06/16/13) F/U MD appmt will be at least week after lab draw

## 2013-05-26 NOTE — Telephone Encounter (Signed)
Called in to [facility] 

## 2013-05-26 NOTE — Progress Notes (Signed)
[facility] [phone]   [phone]  OFFICE PROGRESS NOTE  [name], MD [address] [place] [address]  DIAGNOSIS:  1. multiple myeloma diagnosed in September 2009,  2. history of bilateral lower extremity deep vein thrombosis diagnosed in November 2009  3. acute on chronic deep vein thrombosis in the left lower extremity diagnosed in October 2010.   PRIOR THERAPY:  1. status post palliative radiotherapy to the right femur and is she'll tuberosity, first was done in December 2011 and the second treatment was completed Jan 08 2011, and the third treatment to the right distal femur completed on 04/30/2011  2. status post palliative radiotherapy to the left proximal femur completed 02/28/2011.  3. Revlimid 25 mg by mouth daily for 21 days every 4 weeks in addition to oral Decadron at 40 mg by mouth on a weekly basis status post 40 cycles.  4. weekly subcutaneous Velcade 1.3 mg/M2 status post 60 cycles, discontinued today secondary to mild disease progression.   CURRENT THERAPY:  1. Pomalyst 4 mg by mouth daily for 21 days every 4 weeks, the patient started the first dose on 12/22/2012 , in addition to Decadron 40 mg weekly.  2. Coumadin 5 mg on Mondays and Thursdays and 2.5 mg on all other days.  3. Zometa 4 mg IV given every 2 months.    INTERVAL HISTORY: Meredith Kilbride 77 y.o. female returns to the clinic today for followup visit accompanied by her 2 daughters. She is tolerating her Pomalyst and Decadron without difficulty. She continues to complain of left hip pain but then states it is actually bilateral hip pain and low back pain. An MRI of her lumbar spine and pelvis on 04/08/2013 revealed stable multiple myeloma lesion in the upper sacrum, no fracture and no other lesions. There was bilateral gluteal mediastinitis and left trochanteric bursitis. She has since been started on prednisone 20 mg by mouth once weekly by her primary  care physician, Dr. Para March. The prednisone has slightly down regulated her pain but she still has similar complaints. She complains of increased paresthesias affecting her fingers and toes. She states that the symptoms occur intermittently but seem to be worse lately. She is currently taking gabapentin 300 mg by mouth 3 times daily. She requested refills for her Restoril and Xanax however these have already been called into her pharmacy of record. She continues to maintain fine motor coordination and dexterity.  She denied having any other significant complaints. The patient has no chest pain, shortness breath, cough or hemoptysis. She denied having any significant weight loss or night sweats.   MEDICAL HISTORY: Past Medical History  Diagnosis Date  . Blood transfusion   . Depression     Mild  . History of chicken pox   . Hyperlipidemia   . Hypertension   . Degenerative joint disease   . Diabetes mellitus     Steroid related  . Phlebitis   . Multiple myeloma     per Dr. [name], s/p palliative radiation for leg pain 2011 per Dr. Roselind Messier  . Deep vein thrombosis of bilateral lower extremities     ALLERGIES:  has No Known Allergies.  MEDICATIONS:  Current Outpatient Prescriptions  Medication Sig Dispense Refill  . acyclovir (ZOVIRAX) 400 MG tablet TAKE ONE TABLET BY MOUTH TWICE DAILY  60 tablet  0  . ALPRAZolam (XANAX) 0.5 MG tablet TAKE ONE TABLET BY MOUTH THREE TIMES DAILY AS NEEDED  30  tablet  0  . Calcium Carbonate-Vitamin D 600-400 MG-UNIT per tablet Take 1 tablet by mouth 3 (three) times daily with meals.        Marland Kitchen dexamethasone (DECADRON) 4 MG tablet TAKE 10 TABS BY MOUTH ONCE A WEEK  40 tablet  0  . gabapentin (NEURONTIN) 100 MG capsule TAKE ONE TO FOUR CAPSULES BY MOUTH THREE TIMES DAILY  360 capsule  3  . glipiZIDE (GLUCOTROL XL) 2.5 MG 24 hr tablet Take 1 tablet (2.5 mg total) by mouth daily.  90 tablet  3  . glucose blood (ONE TOUCH ULTRA TEST) test strip Use as instructed  25  each  6  . ibuprofen (ADVIL,MOTRIN) 200 MG tablet Take 200 mg by mouth every 6 (six) hours as needed.        . Lancets (ONETOUCH ULTRASOFT) lancets Test blood sugar once daily or as directed.  100 each  11  . lidocaine (LIDODERM) 5 % Place 1 patch onto the skin daily. Remove & Discard patch within 12 hours or as directed by MD  30 patch  0  . lisinopril-hydrochlorothiazide (PRINZIDE,ZESTORETIC) 20-12.5 MG per tablet Take 0.5 tablets by mouth daily.  45 tablet  3  . magnesium oxide (MAG-OX) 400 MG tablet Take 400 mg by mouth daily.       Marland Kitchen oxyCODONE (OXY IR/ROXICODONE) 5 MG immediate release tablet Take 1 1/2 tabs by mouth every 6 hours as needed for pain  90 tablet  0  . oxyCODONE (OXYCONTIN) 10 MG 12 hr tablet Take 1 tablet (10 mg total) by mouth every 12 (twelve) hours.  60 tablet  0  . pomalidomide (POMALYST) 4 MG capsule Take 1 capsule (4 mg total) by mouth daily. Take with water on days 1-21. Repeat every 28 days.05/06/13-pomalyst authorization number [id number] adult female not of childbearing potential  21 capsule  0  . potassium chloride SA (K-DUR,KLOR-CON) 20 MEQ tablet Take 20 mEq by mouth daily as needed.      . predniSONE (DELTASONE) 20 MG tablet Take 1 tablet (20 mg total) by mouth once a week. On Monday.  Take with food.  12 tablet  1  . prochlorperazine (COMPAZINE) 10 MG tablet Take 1 tablet (10 mg total) by mouth every 6 (six) hours as needed.  30 tablet  1  . temazepam (RESTORIL) 30 MG capsule TAKE ONE CAPSULE BY MOUTH AT BEDTIME prn  30 capsule  0  . warfarin (COUMADIN) 5 MG tablet TAKE ONE TABLET BY MOUTH ONCE DAILY ON TUESDAY, THURSDAY AND SATURDAY.  TAKE 1/2 TABLET ON ALL OTHER DAYS OR AS DIRECTED  60 tablet  0   No current facility-administered medications for this visit.   Facility-Administered Medications Ordered in Other Visits  Medication Dose Route Frequency Provider Last Rate Last Dose  . Zoledronic Acid (ZOMETA) 4 mg IVPB  4 mg Intravenous Once Si Gaul, MD         SURGICAL HISTORY:  Past Surgical History  Procedure Laterality Date  . Abdominal hysterectomy      REVIEW OF SYSTEMS:  A comprehensive review of systems was negative except for: Musculoskeletal: positive for Left hip pain Neurological: positive for paresthesia   PHYSICAL EXAMINATION: General appearance: alert, cooperative and no distress Head: Normocephalic, without obvious abnormality, atraumatic Neck: no adenopathy Lymph nodes: Cervical, supraclavicular, and axillary nodes normal. Resp: clear to auscultation bilaterally Back: symmetric, no curvature. ROM normal. No CVA tenderness. Cardio: regular rate and rhythm, S1, S2 normal, no murmur, click, rub or gallop GI:  soft, non-tender; bowel sounds normal; no masses,  no organomegaly Extremities: extremities normal, atraumatic, no cyanosis or edema and Point tenderness of the greater trochanteric area bilaterally as well as the lower lumbar spine. Neurologic: Alert and oriented X 3, normal strength and tone. Normal symmetric reflexes. Normal coordination and gait  ECOG PERFORMANCE STATUS: 1 - Symptomatic but completely ambulatory  Blood pressure 142/61, pulse 77, temperature 98.8 F (37.1 C), temperature source Oral, resp. rate 18, height 5' (1.524 m), weight 136 lb 4.8 oz (61.825 kg).  LABORATORY DATA: Lab Results  Component Value Date   WBC 4.9 05/26/2013   HGB 11.6 05/26/2013   HCT 35.0 05/26/2013   MCV 97.8 05/26/2013   PLT 221 05/26/2013      Chemistry      Component Value Date/Time   NA 141 05/26/2013 1351   NA 141 06/24/2012 1620   K 3.8 05/26/2013 1351   K 3.1* 06/24/2012 1620   CL 107 02/14/2013 1311   CL 101 06/24/2012 1620   CO2 25 05/26/2013 1351   CO2 32 06/24/2012 1620   BUN 15.9 05/26/2013 1351   BUN 15 06/24/2012 1620   CREATININE 1.1 05/26/2013 1351   CREATININE 0.96 06/24/2012 1620      Component Value Date/Time   CALCIUM 9.6 05/26/2013 1351   CALCIUM 9.4 06/24/2012 1620   CALCIUM  Value: 5.7 CRITICAL  RESULT CALLED TO, READ BACK BY AND VERIFIED WITH: JAMIE TRACY,RN [id number] @ 1433 BY J SCOTTON (NOTE)  Amended report. Result repeated and verified. CORRECTED ON 10/14 AT 1429: PREVIOUSLY REPORTED AS Result repeated and verified.* 06/13/2008 1605   ALKPHOS 52 05/26/2013 1351   ALKPHOS 50 06/24/2012 1620   AST 15 05/26/2013 1351   AST 19 06/24/2012 1620   ALT 19 05/26/2013 1351   ALT 15 06/24/2012 1620   BILITOT 0.27 05/26/2013 1351   BILITOT 0.2* 06/24/2012 1620       RADIOGRAPHIC STUDIES:  Mr Lumbar Spine W Wo Contrast  04/07/2013   *RADIOLOGY REPORT*  Clinical Data: 77 year old female with stage IV multiple myeloma. Low back pain, bilateral hip and lower extremity pain.  MRI LUMBAR SPINE WITHOUT AND WITH CONTRAST  Technique:  Multiplanar and multiecho pulse sequences of the lumbar spine were obtained without and with intravenous contrast.  Contrast: 12mL MULTIHANCE GADOBENATE DIMEGLUMINE 529 MG/ML IV SOLN  Comparison: 09/23/2011.  Findings: Stable vertebral height and alignment and 2013.  Moderate to severe L3 and L4 pathologic compression fractures.  Mild L5 compression fracture.  Enhancing T2 hyperintense lesion occupying the sacrum at S1 has not significantly changed.  There is mildly increased posterior inferior L1 endplate edema and enhancement.  Bone marrow signal at the intact vertebrae has not significantly changed. No new bony lesion in the lumbar spine.   Visualized lower thoracic spinal cord is normal with conus medularis at L1.  No abnormal intradural enhancement.  Cauda equina nerve roots within normal limits.  Negative visualized abdominal viscera.  T10-T11:  Stable and negative.  T11-T12:  Stable disc bulge with borderline to mild spinal stenosis.  Stable right greater than left facet hypertrophy with moderate T11 foraminal stenosis.  T12-L1:  Stable mild disc bulge.  Stable moderate facet hypertrophy.  Stable mild T12 foraminal stenosis.  L1-L2:  Stable mild disc bulge and facet  hypertrophy.  Stable mild L1 foraminal stenosis.  L2-L3:  Stable moderate circumferential disc bulge facet and ligament flavum hypertrophy.  Mild to moderate spinal stenosis is not significantly changed.  Mild to moderate foraminal  stenosis is not significantly changed.  L3-L4:  Left eccentric moderate circumferential disc bulge with moderate facet and ligament flavum hypertrophy is not significantly changed.  Mild to moderate spinal stenosis is not significantly changed.  Moderate to severe left L3 foraminal stenosis likewise is stable.  L4-L5:  Left eccentric moderate circumferential disc bulge plus mild facet and ligament flavum hypertrophy is stable.  No significant spinal stenosis.  Stable moderate left lateral recess stenosis.  Stable moderate bilateral L4 foraminal stenosis.  L5-S1:  Stable left eccentric circumferential disc osteophyte complex.  Stable mild to moderate facet and ligament flavum hypertrophy greater on the left.  Moderate left lateral recess stenosis is stable.  Moderate to severe left greater than right L5 foraminal stenosis is stable.  IMPRESSION: 1.  Mildly increased marrow edema and enhancement at the posterior inferior L1 endplate.  This could be degenerative or related to focal increased multiple myeloma. 2.  Otherwise the lumbar spine and visible sacrum is unchanged since 09/23/2011.   Original Report Authenticated By: Erskine Speed, M.D.   Mr Pelvis W Wo Contrast  04/08/2013   *RADIOLOGY REPORT*  Clinical Data: Multiple myeloma with back and bilateral leg pain.  MRI PELVIS WITHOUT AND WITH CONTRAST  Technique:  Multiplanar multisequence MR imaging of the pelvis was performed both before and after administration of intravenous contrast.  Contrast: 12mL MULTIHANCE GADOBENATE DIMEGLUMINE 529 MG/ML IV SOLN  Comparison: Lumbar spine MRI 09/23/2011.  Findings: There is a stable large myelomatous lesion involving the sacrum at S1.  This demonstrates fluid signal intensity and irregular  nodular enhancement.  This appears relatively stable.  I do not see an associated pathologic fracture.  There are remote superior and inferior pubic rami fractures on the right side but I do not see any other myelomatous bone lesions.  Moderate hip joint degenerative changes bilaterally.  There is a benign intraosseous lipoma involving the left femoral head.  There is bilateral gluteus medius tendonitis and left-sided trochanteric bursitis.  Otherwise, the surrounding hip and pelvic musculature are unremarkable.  No significant intrapelvic abnormalities are demonstrated. Multiple uterine fibroids are noted.  IMPRESSION:  1.  Stable appearing myelomatous lesion involving the upper sacrum without evidence of a new pathologic fracture. 2.  No other definite myelomatous lesions involving the pelvis or hips. 3.  Bilateral gluteus medius tendonitis and left-sided trochanteric bursitis.   Original Report Authenticated By: Rudie Meyer, M.D.   ASSESSMENT AND PLAN: This is a very pleasant 76 years old [place] American female with history of multiple myeloma diagnosed in September of 2009 has been on several chemotherapy regimen and currently on Pomalyst milligram by mouth daily for 21 days every 4 weeks in addition to weekly Decadron.Her most recent myeloma panel showed no evidence for disease progression. She will continue with her treatment was Pomalyst as scheduled. MRI of the lumbar spine and pelvis as described above was significant for bilateral gluteus medius tendinitis and left trochanteric bursitis. She will continue on her once weekly prednisone 20 mg as prescribed by Dr. Para March for this inflammation. Patient was reviewed with Dr. Truett Perna. Regarding her complaints of increased peripheral neuropathy patient was asked increase in her gabapentin as follows 300 mg in the morning 300 mg midday and 400 mg in the evening we will slowly titrate and see if this improves her symptoms. We'll continue to monitor her  complaints of peripheral neuropathy. She will continue with her Zometa for her bone lesions every 2 months as scheduled. She'll follow up with Dr. [name]  in one month for another symptom management visit regarding her Pomalyst and Decadron therapy. A repeat CBC differential, C. met and LDH and myeloma panel will be done one week prior to her followup visit so these results will be available for Dr. [name] to discuss in reevaluating her disease.   Azion Centrella E, PA-C   She was advised to call immediately if she has any concerning symptoms in the interval.  All questions were answered. The patient knows to call the clinic with any problems, questions or concerns. We can certainly see the patient much sooner if necessary.

## 2013-05-26 NOTE — Telephone Encounter (Signed)
Gave pt appt for lab and MD for October 2014 °

## 2013-05-28 NOTE — Patient Instructions (Addendum)
Take your gabapentin 300 mg in the morning and at mid-day and 400 mg at bedtime for the numbness and tingling your are experiencing in your fingers and toes Continue your Pomalyst and Decadron as prescribed Follow up with Dr. [name] in 1 month

## 2013-05-30 ENCOUNTER — Ambulatory Visit: Payer: Medicare Other | Admitting: Physician Assistant

## 2013-06-02 ENCOUNTER — Other Ambulatory Visit: Payer: Self-pay | Admitting: *Deleted

## 2013-06-02 DIAGNOSIS — C9000 Multiple myeloma not having achieved remission: Secondary | ICD-10-CM

## 2013-06-02 MED ORDER — POMALIDOMIDE 4 MG PO CAPS: 4.0000 mg | ORAL_CAPSULE | Freq: Every day | ORAL | Status: DC

## 2013-06-02 NOTE — Addendum Note (Signed)
Addended by: Arvilla Meres on: 06/02/2013 03:19 PM   Modules accepted: Orders

## 2013-06-02 NOTE — Telephone Encounter (Signed)
THIS REFILL REQUEST FOR POMALYST WAS PLACED ON DR.MOHAMED'S DESK. 

## 2013-06-07 NOTE — Telephone Encounter (Signed)
RECEIVED A FAX FROM [employer] CONCERNING A CONFIRMATION OF PRESCRIPTION SHIPMENT FOR POMALYST ON 06/06/13. 

## 2013-06-08 ENCOUNTER — Other Ambulatory Visit: Payer: Self-pay | Admitting: Internal Medicine

## 2013-06-08 DIAGNOSIS — C9000 Multiple myeloma not having achieved remission: Secondary | ICD-10-CM

## 2013-06-08 MED ORDER — OXYCODONE HCL ER 10 MG PO T12A: 10.0000 mg | EXTENDED_RELEASE_TABLET | Freq: Two times a day (BID) | ORAL | Status: DC

## 2013-06-08 MED ORDER — OXYCODONE HCL 5 MG PO TABS: ORAL_TABLET | ORAL | Status: DC

## 2013-06-13 ENCOUNTER — Other Ambulatory Visit: Payer: Self-pay | Admitting: Internal Medicine

## 2013-06-15 ENCOUNTER — Telehealth: Payer: Self-pay | Admitting: Medical Oncology

## 2013-06-15 ENCOUNTER — Other Ambulatory Visit: Payer: Self-pay | Admitting: Medical Oncology

## 2013-06-15 DIAGNOSIS — C9000 Multiple myeloma not having achieved remission: Secondary | ICD-10-CM

## 2013-06-15 NOTE — Telephone Encounter (Signed)
Returned daughters call. She wants to know if Cora can get flu vaccine tomorrow. Family notified.

## 2013-06-16 ENCOUNTER — Ambulatory Visit: Payer: Medicare Other | Admitting: Pharmacist

## 2013-06-16 ENCOUNTER — Other Ambulatory Visit (HOSPITAL_BASED_OUTPATIENT_CLINIC_OR_DEPARTMENT_OTHER): Payer: Medicare Other | Admitting: Lab

## 2013-06-16 ENCOUNTER — Ambulatory Visit (HOSPITAL_BASED_OUTPATIENT_CLINIC_OR_DEPARTMENT_OTHER): Payer: Medicare Other

## 2013-06-16 VITALS — BP 121/60 | HR 76 | Temp 98.6°F

## 2013-06-16 DIAGNOSIS — C9000 Multiple myeloma not having achieved remission: Secondary | ICD-10-CM

## 2013-06-16 DIAGNOSIS — I82409 Acute embolism and thrombosis of unspecified deep veins of unspecified lower extremity: Secondary | ICD-10-CM

## 2013-06-16 DIAGNOSIS — Z23 Encounter for immunization: Secondary | ICD-10-CM

## 2013-06-16 DIAGNOSIS — I82401 Acute embolism and thrombosis of unspecified deep veins of right lower extremity: Secondary | ICD-10-CM

## 2013-06-16 LAB — CBC WITH DIFFERENTIAL/PLATELET
Basophils Absolute: 0.1 10*3/uL (ref 0.0–0.1)
EOS%: 3.9 % (ref 0.0–7.0)
Eosinophils Absolute: 0.3 10*3/uL (ref 0.0–0.5)
HCT: 33.6 % — ABNORMAL LOW (ref 34.8–46.6)
HGB: 11 g/dL — ABNORMAL LOW (ref 11.6–15.9)
MCH: 31.7 pg (ref 25.1–34.0)
MCV: 97.1 fL (ref 79.5–101.0)
MONO%: 5.4 % (ref 0.0–14.0)
NEUT%: 79.9 % — ABNORMAL HIGH (ref 38.4–76.8)
WBC: 7.1 10*3/uL (ref 3.9–10.3)

## 2013-06-16 LAB — COMPREHENSIVE METABOLIC PANEL (CC13)
ALT: 13 U/L (ref 0–55)
Albumin: 2.9 g/dL — ABNORMAL LOW (ref 3.5–5.0)
Alkaline Phosphatase: 45 U/L (ref 40–150)
Anion Gap: 11 mEq/L (ref 3–11)
BUN: 21.7 mg/dL (ref 7.0–26.0)
CO2: 29 mEq/L (ref 22–29)
Creatinine: 1.2 mg/dL — ABNORMAL HIGH (ref 0.6–1.1)
Glucose: 163 mg/dl — ABNORMAL HIGH (ref 70–140)
Potassium: 3.4 mEq/L — ABNORMAL LOW (ref 3.5–5.1)
Sodium: 144 mEq/L (ref 136–145)
Total Bilirubin: 0.26 mg/dL (ref 0.20–1.20)

## 2013-06-16 LAB — LACTATE DEHYDROGENASE (CC13): LDH: 236 U/L (ref 125–245)

## 2013-06-16 LAB — POCT INR: INR: 2.3

## 2013-06-16 MED ORDER — INFLUENZA VAC SPLIT QUAD 0.5 ML IM SUSP
0.5000 mL | INTRAMUSCULAR | Status: AC
  Administered 2013-06-16: 0.5 mL via INTRAMUSCULAR
  Filled 2013-06-16: qty 0.5

## 2013-06-16 NOTE — Progress Notes (Signed)
INR remains at goal on current coumadin dose.  No changes.  Will continue Coumadin 5mg  M/W and 2.5mg  other days.  Will check PT/INR in 1 month.  [name] is getting flu shot today.

## 2013-06-17 LAB — IGG, IGA, IGM
IgA: 86 mg/dL (ref 69–380)
IgG (Immunoglobin G), Serum: 391 mg/dL — ABNORMAL LOW (ref 690–1700)

## 2013-06-17 LAB — BETA 2 MICROGLOBULIN, SERUM: Beta-2 Microglobulin: 2.58 mg/L — ABNORMAL HIGH (ref 1.01–1.73)

## 2013-06-17 LAB — KAPPA/LAMBDA LIGHT CHAINS
Kappa free light chain: 1.01 mg/dL (ref 0.33–1.94)
Kappa:Lambda Ratio: 0.07 — ABNORMAL LOW (ref 0.26–1.65)

## 2013-06-22 ENCOUNTER — Other Ambulatory Visit: Payer: Self-pay | Admitting: Internal Medicine

## 2013-06-23 ENCOUNTER — Other Ambulatory Visit (HOSPITAL_BASED_OUTPATIENT_CLINIC_OR_DEPARTMENT_OTHER): Payer: Medicare Other

## 2013-06-23 ENCOUNTER — Telehealth: Payer: Self-pay | Admitting: Internal Medicine

## 2013-06-23 ENCOUNTER — Encounter (INDEPENDENT_AMBULATORY_CARE_PROVIDER_SITE_OTHER): Payer: Self-pay

## 2013-06-23 ENCOUNTER — Ambulatory Visit (HOSPITAL_BASED_OUTPATIENT_CLINIC_OR_DEPARTMENT_OTHER): Payer: Medicare Other | Admitting: Internal Medicine

## 2013-06-23 ENCOUNTER — Encounter: Payer: Self-pay | Admitting: Internal Medicine

## 2013-06-23 VITALS — BP 130/65 | HR 82 | Temp 97.3°F | Resp 18 | Ht 60.0 in | Wt 139.2 lb

## 2013-06-23 DIAGNOSIS — C9000 Multiple myeloma not having achieved remission: Secondary | ICD-10-CM

## 2013-06-23 DIAGNOSIS — Z7901 Long term (current) use of anticoagulants: Secondary | ICD-10-CM

## 2013-06-23 DIAGNOSIS — I82409 Acute embolism and thrombosis of unspecified deep veins of unspecified lower extremity: Secondary | ICD-10-CM

## 2013-06-23 LAB — COMPREHENSIVE METABOLIC PANEL (CC13)
AST: 12 U/L (ref 5–34)
Albumin: 2.8 g/dL — ABNORMAL LOW (ref 3.5–5.0)
Alkaline Phosphatase: 49 U/L (ref 40–150)
Anion Gap: 10 mEq/L (ref 3–11)
BUN: 15.5 mg/dL (ref 7.0–26.0)
CO2: 27 mEq/L (ref 22–29)
Calcium: 10 mg/dL (ref 8.4–10.4)
Chloride: 105 mEq/L (ref 98–109)
Glucose: 188 mg/dl — ABNORMAL HIGH (ref 70–140)
Potassium: 3.7 mEq/L (ref 3.5–5.1)
Total Protein: 6 g/dL — ABNORMAL LOW (ref 6.4–8.3)

## 2013-06-23 LAB — CBC WITH DIFFERENTIAL/PLATELET
Basophils Absolute: 0.1 10*3/uL (ref 0.0–0.1)
EOS%: 21.7 % — ABNORMAL HIGH (ref 0.0–7.0)
Eosinophils Absolute: 1 10*3/uL — ABNORMAL HIGH (ref 0.0–0.5)
HCT: 35 % (ref 34.8–46.6)
HGB: 11.4 g/dL — ABNORMAL LOW (ref 11.6–15.9)
MCH: 31.8 pg (ref 25.1–34.0)
MCV: 97.5 fL (ref 79.5–101.0)
NEUT#: 2.3 10*3/uL (ref 1.5–6.5)
NEUT%: 48.3 % (ref 38.4–76.8)
WBC: 4.7 10*3/uL (ref 3.9–10.3)
lymph#: 0.7 10*3/uL — ABNORMAL LOW (ref 0.9–3.3)

## 2013-06-23 NOTE — Progress Notes (Signed)
[facility] [phone]   [phone]  OFFICE PROGRESS NOTE  [name], MD [address] [place] [address]  DIAGNOSIS:  1. multiple myeloma diagnosed in September 2009,  2. history of bilateral lower extremity deep vein thrombosis diagnosed in November 2009  3. acute on chronic deep vein thrombosis in the left lower extremity diagnosed in October 2010.   PRIOR THERAPY:  1. status post palliative radiotherapy to the right femur and is she'll tuberosity, first was done in December 2011 and the second treatment was completed Jan 08 2011, and the third treatment to the right distal femur completed on 04/30/2011  2. status post palliative radiotherapy to the left proximal femur completed 02/28/2011.  3. Revlimid 25 mg by mouth daily for 21 days every 4 weeks in addition to oral Decadron at 40 mg by mouth on a weekly basis status post 40 cycles.  4. weekly subcutaneous Velcade 1.3 mg/M2 status post 60 cycles, discontinued today secondary to mild disease progression.   CURRENT THERAPY:  1. Pomalyst 4 mg by mouth daily for 21 days every 4 weeks, the patient started the first dose on 12/22/2012 , in addition to Decadron 40 mg weekly. She will complete cycle #6 in 4 days.  2. Coumadin 5 mg on Mondays and Thursdays and 2.5 mg on all other days.  3. Zometa 4 mg IV given every 2 months.    INTERVAL HISTORY: [name] 77 y.o. female returns to the clinic today for followup visit accompanied by her 2 daughters. The patient continues to complain of increasing fatigue and the pain and peripheral neuropathy in the lower extremities. She is tolerating her treatment was Pomalyst fairly well. She denied having any significant nausea or vomiting. The patient denied having any significant weight loss or night sweats. She has no chest pain, shortness of breath, cough or hemoptysis. The patient had repeat myeloma panel performed recently and she is here for  evaluation and discussion of her lab results.  MEDICAL HISTORY: Past Medical History  Diagnosis Date  . Blood transfusion   . Depression     Mild  . History of chicken pox   . Hyperlipidemia   . Hypertension   . Degenerative joint disease   . Diabetes mellitus     Steroid related  . Phlebitis   . Multiple myeloma     per Dr. [name], s/p palliative radiation for leg pain 2011 per Dr. Roselind Messier  . Deep vein thrombosis of bilateral lower extremities     ALLERGIES:  has No Known Allergies.  MEDICATIONS:  Current Outpatient Prescriptions  Medication Sig Dispense Refill  . acyclovir (ZOVIRAX) 400 MG tablet TAKE ONE TABLET BY MOUTH TWICE DAILY  60 tablet  0  . ALPRAZolam (XANAX) 0.5 MG tablet TAKE ONE TABLET BY MOUTH THREE TIMES DAILY AS NEEDED  30 tablet  0  . Calcium Carbonate-Vitamin D 600-400 MG-UNIT per tablet Take 1 tablet by mouth 3 (three) times daily with meals.        Marland Kitchen dexamethasone (DECADRON) 4 MG tablet TAKE 10 TABLETS BY MOUTH ONCE PER WEEK  40 tablet  0  . gabapentin (NEURONTIN) 100 MG capsule TAKE ONE TO FOUR CAPSULES BY MOUTH THREE TIMES DAILY  360 capsule  3  . glipiZIDE (GLUCOTROL XL) 2.5 MG 24 hr tablet Take 1 tablet (2.5 mg total) by mouth daily.  90 tablet  3  . glucose blood (ONE TOUCH ULTRA TEST) test strip Use  as instructed  25 each  6  . ibuprofen (ADVIL,MOTRIN) 200 MG tablet Take 200 mg by mouth every 6 (six) hours as needed.        . Lancets (ONETOUCH ULTRASOFT) lancets Test blood sugar once daily or as directed.  100 each  11  . lidocaine (LIDODERM) 5 % Place 1 patch onto the skin daily. Remove & Discard patch within 12 hours or as directed by MD  30 patch  0  . lisinopril-hydrochlorothiazide (PRINZIDE,ZESTORETIC) 20-12.5 MG per tablet Take 0.5 tablets by mouth daily.  45 tablet  3  . magnesium oxide (MAG-OX) 400 MG tablet Take 400 mg by mouth daily.       Marland Kitchen oxyCODONE (OXY IR/ROXICODONE) 5 MG immediate release tablet Take 1 1/2 tabs by mouth every 6 hours as  needed for pain  90 tablet  0  . oxyCODONE (OXYCONTIN) 10 MG 12 hr tablet Take 1 tablet (10 mg total) by mouth every 12 (twelve) hours.  60 tablet  0  . OxyCODONE (OXYCONTIN) 10 mg T12A 12 hr tablet Take 1 tablet (10 mg total) by mouth every 12 (twelve) hours.  60 tablet  0  . pomalidomide (POMALYST) 4 MG capsule Take 1 capsule (4 mg total) by mouth daily. Take with water on days 1-21. Repeat every 28 days.05/06/13-pomalyst authorization number [id number] adult female not of childbearing potential  21 capsule  0  . potassium chloride SA (K-DUR,KLOR-CON) 20 MEQ tablet Take 20 mEq by mouth daily as needed.      . predniSONE (DELTASONE) 20 MG tablet Take 1 tablet (20 mg total) by mouth once a week. On Monday.  Take with food.  12 tablet  1  . prochlorperazine (COMPAZINE) 10 MG tablet Take 1 tablet (10 mg total) by mouth every 6 (six) hours as needed.  30 tablet  1  . temazepam (RESTORIL) 30 MG capsule TAKE ONE CAPSULE BY MOUTH AT BEDTIME prn  30 capsule  0  . warfarin (COUMADIN) 5 MG tablet TAKE ONE TABLET BY MOUTH ONCE DAILY ON TUESDAY, THURSDAY AND SATURDAY.  TAKE 1/2 TABLET ON ALL OTHER DAYS OR AS DIRECTED  60 tablet  0   No current facility-administered medications for this visit.   Facility-Administered Medications Ordered in Other Visits  Medication Dose Route Frequency Provider Last Rate Last Dose  . Zoledronic Acid (ZOMETA) 4 mg IVPB  4 mg Intravenous Once Si Gaul, MD        SURGICAL HISTORY:  Past Surgical History  Procedure Laterality Date  . Abdominal hysterectomy      REVIEW OF SYSTEMS:  Constitutional: positive for fatigue Eyes: negative Ears, nose, mouth, throat, and face: negative Respiratory: negative Cardiovascular: negative Gastrointestinal: negative Genitourinary:negative Integument/breast: negative Hematologic/lymphatic: negative Musculoskeletal:negative Neurological: positive for paresthesia Behavioral/Psych: negative Endocrine: negative Allergic/Immunologic:  negative   PHYSICAL EXAMINATION: General appearance: alert, cooperative, fatigued and no distress Head: Normocephalic, without obvious abnormality, atraumatic Neck: no adenopathy, no JVD, supple, symmetrical, trachea midline and thyroid not enlarged, symmetric, no tenderness/mass/nodules Lymph nodes: Cervical, supraclavicular, and axillary nodes normal. Resp: clear to auscultation bilaterally Back: symmetric, no curvature. ROM normal. No CVA tenderness. Cardio: regular rate and rhythm, S1, S2 normal, no murmur, click, rub or gallop GI: soft, non-tender; bowel sounds normal; no masses,  no organomegaly Extremities: extremities normal, atraumatic, no cyanosis or edema Neurologic: Alert and oriented X 3, normal strength and tone. Normal symmetric reflexes. Normal coordination and gait  ECOG PERFORMANCE STATUS: 2 - Symptomatic, <50% confined to bed  Blood pressure  130/65, pulse 82, temperature 97.3 F (36.3 C), temperature source Oral, resp. rate 18, height 5' (1.524 m), weight 139 lb 3.2 oz (63.141 kg).  LABORATORY DATA: Lab Results  Component Value Date   WBC 4.7 06/23/2013   HGB 11.4* 06/23/2013   HCT 35.0 06/23/2013   MCV 97.5 06/23/2013   PLT 233 06/23/2013      Chemistry      Component Value Date/Time   NA 144 06/16/2013 1343   NA 141 06/24/2012 1620   K 3.4* 06/16/2013 1343   K 3.1* 06/24/2012 1620   CL 107 02/14/2013 1311   CL 101 06/24/2012 1620   CO2 29 06/16/2013 1343   CO2 32 06/24/2012 1620   BUN 21.7 06/16/2013 1343   BUN 15 06/24/2012 1620   CREATININE 1.2* 06/16/2013 1343   CREATININE 0.96 06/24/2012 1620      Component Value Date/Time   CALCIUM 10.3 06/16/2013 1343   CALCIUM 9.4 06/24/2012 1620   CALCIUM  Value: 5.7 CRITICAL RESULT CALLED TO, READ BACK BY AND VERIFIED WITH: JAMIE TRACY,RN [id number] @ 1433 BY J SCOTTON (NOTE)  Amended report. Result repeated and verified. CORRECTED ON 10/14 AT 1429: PREVIOUSLY REPORTED AS Result repeated and verified.*  06/13/2008 1605   ALKPHOS 45 06/16/2013 1343   ALKPHOS 50 06/24/2012 1620   AST 11 06/16/2013 1343   AST 19 06/24/2012 1620   ALT 13 06/16/2013 1343   ALT 15 06/24/2012 1620   BILITOT 0.26 06/16/2013 1343   BILITOT 0.2* 06/24/2012 1620       RADIOGRAPHIC STUDIES: No results found.  ASSESSMENT AND PLAN: This is a very pleasant 77 years old African American female with multiple myeloma currently treatment was Pomalyst 4 mg by mouth daily for 21 days every 4 weeks status post 6 cycles. The patient is tolerating her treatment fairly well with no significant evidence for disease progression except for mild increase in the free lambda light chain. I discussed the lab result with the patient and her daughters. I recommended for her to continue on Pomalyst with the same dose. She will also continue on Zometa every 2 months as scheduled. She would come back for followup visit in one month's for reevaluation with repeat CBC, comprehensive metabolic panel and LDH. The patient voices understanding of current disease status and treatment options and is in agreement with the current care plan.  All questions were answered. The patient knows to call the clinic with any problems, questions or concerns. We can certainly see the patient much sooner if necessary.  I spent 15 minutes counseling the patient face to face. The total time spent in the appointment was 25 minutes.

## 2013-06-23 NOTE — Telephone Encounter (Signed)
per stephanie pt has zometa every 

## 2013-06-23 NOTE — Patient Instructions (Signed)
Followup in one month with repeat blood work

## 2013-06-23 NOTE — Telephone Encounter (Signed)
gv and printed appt sched and avs for pt for NOV adn Jan 2015

## 2013-06-30 ENCOUNTER — Telehealth: Payer: Self-pay | Admitting: Medical Oncology

## 2013-06-30 ENCOUNTER — Other Ambulatory Visit: Payer: Self-pay | Admitting: Medical Oncology

## 2013-06-30 DIAGNOSIS — C9000 Multiple myeloma not having achieved remission: Secondary | ICD-10-CM

## 2013-06-30 MED ORDER — POMALIDOMIDE 4 MG PO CAPS: 4.0000 mg | ORAL_CAPSULE | Freq: Every day | ORAL | Status: DC

## 2013-06-30 NOTE — Telephone Encounter (Signed)
Faxed Pomalyst refill to [facility]

## 2013-07-04 NOTE — Telephone Encounter (Signed)
RECEIVED TWO FAXES FROM [employer] CONCERNING CONFIRMATION OF FACSIMILE RECEIPT AND PRESCRIPTION SHIPMENT FOR POMALYST ON 07/01/13.

## 2013-07-06 ENCOUNTER — Other Ambulatory Visit: Payer: Self-pay | Admitting: *Deleted

## 2013-07-06 DIAGNOSIS — C9000 Multiple myeloma not having achieved remission: Secondary | ICD-10-CM

## 2013-07-06 MED ORDER — OXYCODONE HCL ER 10 MG PO T12A: 10.0000 mg | EXTENDED_RELEASE_TABLET | Freq: Two times a day (BID) | ORAL | Status: DC

## 2013-07-06 MED ORDER — OXYCODONE HCL 5 MG PO TABS: ORAL_TABLET | ORAL | Status: DC

## 2013-07-07 ENCOUNTER — Ambulatory Visit (HOSPITAL_BASED_OUTPATIENT_CLINIC_OR_DEPARTMENT_OTHER): Payer: Medicare Other

## 2013-07-07 ENCOUNTER — Other Ambulatory Visit: Payer: Self-pay | Admitting: Medical Oncology

## 2013-07-07 ENCOUNTER — Encounter (INDEPENDENT_AMBULATORY_CARE_PROVIDER_SITE_OTHER): Payer: Self-pay

## 2013-07-07 VITALS — BP 100/45 | HR 61 | Temp 97.0°F | Resp 20

## 2013-07-07 DIAGNOSIS — C9000 Multiple myeloma not having achieved remission: Secondary | ICD-10-CM

## 2013-07-07 MED ORDER — SODIUM CHLORIDE 0.9 % IJ SOLN
10.0000 mL | INTRAMUSCULAR | Status: DC | PRN
  Filled 2013-07-07: qty 10

## 2013-07-07 MED ORDER — ZOLEDRONIC ACID 4 MG/100ML IV SOLN
4.0000 mg | Freq: Once | INTRAVENOUS | Status: AC
  Administered 2013-07-07: 4 mg via INTRAVENOUS
  Filled 2013-07-07: qty 100

## 2013-07-07 MED ORDER — HEPARIN SOD (PORK) LOCK FLUSH 100 UNIT/ML IV SOLN
250.0000 [IU] | Freq: Once | INTRAVENOUS | Status: DC | PRN
  Filled 2013-07-07: qty 5

## 2013-07-07 MED ORDER — SODIUM CHLORIDE 0.9 % IV SOLN
Freq: Once | INTRAVENOUS | Status: AC
  Administered 2013-07-07: 16:00:00 via INTRAVENOUS

## 2013-07-07 NOTE — Patient Instructions (Signed)

## 2013-07-08 ENCOUNTER — Other Ambulatory Visit: Payer: Self-pay | Admitting: Internal Medicine

## 2013-07-08 ENCOUNTER — Other Ambulatory Visit: Payer: Self-pay | Admitting: Medical Oncology

## 2013-07-11 ENCOUNTER — Other Ambulatory Visit: Payer: Self-pay | Admitting: Internal Medicine

## 2013-07-12 ENCOUNTER — Other Ambulatory Visit: Payer: Self-pay | Admitting: *Deleted

## 2013-07-12 ENCOUNTER — Other Ambulatory Visit: Payer: Self-pay | Admitting: Internal Medicine

## 2013-07-12 DIAGNOSIS — C9000 Multiple myeloma not having achieved remission: Secondary | ICD-10-CM

## 2013-07-14 ENCOUNTER — Other Ambulatory Visit (HOSPITAL_BASED_OUTPATIENT_CLINIC_OR_DEPARTMENT_OTHER): Payer: Medicare Other | Admitting: Lab

## 2013-07-14 ENCOUNTER — Ambulatory Visit: Payer: Medicare Other

## 2013-07-14 ENCOUNTER — Ambulatory Visit (HOSPITAL_BASED_OUTPATIENT_CLINIC_OR_DEPARTMENT_OTHER): Payer: Self-pay | Admitting: Pharmacist

## 2013-07-14 DIAGNOSIS — I82409 Acute embolism and thrombosis of unspecified deep veins of unspecified lower extremity: Secondary | ICD-10-CM

## 2013-07-14 DIAGNOSIS — I82401 Acute embolism and thrombosis of unspecified deep veins of right lower extremity: Secondary | ICD-10-CM

## 2013-07-14 LAB — PROTIME-INR
INR: 2.5 (ref 2.00–3.50)
Protime: 30 Seconds — ABNORMAL HIGH (ref 10.6–13.4)

## 2013-07-14 LAB — POCT INR: INR: 2.5

## 2013-07-14 NOTE — Patient Instructions (Signed)
INR at goal No changes Continue coumadin to 2.5mg  daily except 5mg  on Mon&Wed Recheck PT/INR in 4 weeks on 08/11/13: lab at 2pm and coumadin clinic at 2:15pm.

## 2013-07-14 NOTE — Progress Notes (Signed)
INR at goal Pt is doing well without complaints No issues with bruising or bleeding No missed or extra doses No medication or diet changes Pt does state she had a slight fall a week or 2 ago but this resulted in no bruising or bleeding We will not make any changes Plan: Continue coumadin 2.5mg  daily except 5mg  on Mon&Wed Recheck PT/INR in 4 weeks on 08/11/13: lab at 2pm and coumadin clinic at 2:15pm.

## 2013-07-21 ENCOUNTER — Other Ambulatory Visit (HOSPITAL_BASED_OUTPATIENT_CLINIC_OR_DEPARTMENT_OTHER): Payer: Medicare Other | Admitting: Lab

## 2013-07-21 ENCOUNTER — Encounter: Payer: Self-pay | Admitting: Physician Assistant

## 2013-07-21 ENCOUNTER — Ambulatory Visit (HOSPITAL_BASED_OUTPATIENT_CLINIC_OR_DEPARTMENT_OTHER): Payer: Medicare Other | Admitting: Physician Assistant

## 2013-07-21 VITALS — BP 153/66 | HR 53 | Temp 97.8°F | Resp 18 | Ht 60.0 in | Wt 134.1 lb

## 2013-07-21 DIAGNOSIS — C9000 Multiple myeloma not having achieved remission: Secondary | ICD-10-CM

## 2013-07-21 DIAGNOSIS — G609 Hereditary and idiopathic neuropathy, unspecified: Secondary | ICD-10-CM

## 2013-07-21 LAB — COMPREHENSIVE METABOLIC PANEL (CC13)
ALT: 21 U/L (ref 0–55)
AST: 16 U/L (ref 5–34)
Albumin: 3.3 g/dL — ABNORMAL LOW (ref 3.5–5.0)
BUN: 17 mg/dL (ref 7.0–26.0)
Calcium: 10 mg/dL (ref 8.4–10.4)
Chloride: 106 mEq/L (ref 98–109)
Potassium: 3.5 mEq/L (ref 3.5–5.1)
Sodium: 143 mEq/L (ref 136–145)
Total Protein: 6.5 g/dL (ref 6.4–8.3)

## 2013-07-21 LAB — CBC WITH DIFFERENTIAL/PLATELET
BASO%: 1.4 % (ref 0.0–2.0)
Basophils Absolute: 0.1 10*3/uL (ref 0.0–0.1)
EOS%: 3.5 % (ref 0.0–7.0)
HGB: 11.6 g/dL (ref 11.6–15.9)
LYMPH%: 12.2 % — ABNORMAL LOW (ref 14.0–49.7)
MCH: 32 pg (ref 25.1–34.0)
NEUT#: 3.1 10*3/uL (ref 1.5–6.5)
Platelets: 211 10*3/uL (ref 145–400)
RBC: 3.64 10*6/uL — ABNORMAL LOW (ref 3.70–5.45)
RDW: 16 % — ABNORMAL HIGH (ref 11.2–14.5)
lymph#: 0.6 10*3/uL — ABNORMAL LOW (ref 0.9–3.3)

## 2013-07-21 MED ORDER — ALPRAZOLAM 0.5 MG PO TABS: 0.5000 mg | ORAL_TABLET | Freq: Two times a day (BID) | ORAL | Status: DC | PRN

## 2013-07-21 NOTE — Progress Notes (Addendum)
[facility] [phone]   Fax:(336) [phone]  OFFICE PROGRESS NOTE  [name], MD [address] [place] [address]  DIAGNOSIS:  1. multiple myeloma diagnosed in September 2009,  2. history of bilateral lower extremity deep vein thrombosis diagnosed in November 2009  3. acute on chronic deep vein thrombosis in the left lower extremity diagnosed in October 2010.   PRIOR THERAPY:  1. status post palliative radiotherapy to the right femur and is she'll tuberosity, first was done in December 2011 and the second treatment was completed Jan 08 2011, and the third treatment to the right distal femur completed on 04/30/2011  2. status post palliative radiotherapy to the left proximal femur completed 02/28/2011.  3. Revlimid 25 mg by mouth daily for 21 days every 4 weeks in addition to oral Decadron at 40 mg by mouth on a weekly basis status post 40 cycles.  4. weekly subcutaneous Velcade 1.3 mg/M2 status post 60 cycles, discontinued today secondary to mild disease progression.   CURRENT THERAPY:  1. Pomalyst 4 mg by mouth daily for 21 days every 4 weeks, the patient started the first dose on 12/22/2012 , in addition to Decadron 40 mg weekly. She status post 6 cycles  2. Coumadin 5 mg on Mondays and Thursdays and 2.5 mg on all other days.  3. Zometa 4 mg IV given every 2 months.    INTERVAL HISTORY: [name] 77 y.o. female returns to the clinic today for followup visit accompanied by her daughter. Her youngest daughter apparently has had another mild stroke and is undergoing further studies today. Overall the patient is feeling well reporting that she has good days and not so good days. She is tolerating her treatment with the Pomalyst and Decadron without difficulty. She is taking her Coumadin per the [facility] Cou[facility]in [facility] instructions and is due to see them again for followup on 08/11/2013. The patient continues to complain of  increasing fatigue and the pain and peripheral neuropathy in the lower extremities.  She denied having any significant nausea or vomiting. She is eating well however has lost 5 pounds and she was here last month. The patient denied having any night sweats. She has no chest pain, shortness of breath, cough or hemoptysis.   MEDICAL HISTORY: Past Medical History  Diagnosis Date  . Blood transfusion   . Depression     Mild  . History of chicken pox   . Hyperlipidemia   . Hypertension   . Degenerative joint disease   . Diabetes mellitus     Steroid related  . Phlebitis   . Multiple myeloma     per Dr. [name], s/p palliative radiation for leg pain 2011 per Dr. Roselind Messier  . Deep vein thrombosis of bilateral lower extremities     ALLERGIES:  has No Known Allergies.  MEDICATIONS:  Current Outpatient Prescriptions  Medication Sig Dispense Refill  . acyclovir (ZOVIRAX) 400 MG tablet TAKE ONE TABLET BY MOUTH TWICE DAILY  60 tablet  0  . ALPRAZolam (XANAX) 0.5 MG tablet Take 1 tablet (0.5 mg total) by mouth 2 (two) times daily as needed for anxiety.  60 tablet  0  . Calcium Carbonate-Vitamin D 600-400 MG-UNIT per tablet Take 1 tablet by mouth 3 (three) times daily with meals.        Marland Kitchen dexamethasone (DECADRON) 4 MG tablet TAKE TEN TABLETS BY MOUTH ONCE PER WEEK  40 tablet  0  .  gabapentin (NEURONTIN) 100 MG capsule TAKE ONE TO FOUR CAPSULES BY MOUTH THREE TIMES DAILY  360 capsule  3  . glipiZIDE (GLUCOTROL XL) 2.5 MG 24 hr tablet Take 1 tablet (2.5 mg total) by mouth daily.  90 tablet  3  . glucose blood (ONE TOUCH ULTRA TEST) test strip Use as instructed  25 each  6  . ibuprofen (ADVIL,MOTRIN) 200 MG tablet Take 200 mg by mouth every 6 (six) hours as needed.        . Lancets (ONETOUCH ULTRASOFT) lancets Test blood sugar once daily or as directed.  100 each  11  . lidocaine (LIDODERM) 5 % Place 1 patch onto the skin daily. Remove & Discard patch within 12 hours or as directed by MD  30 patch  0  .  lisinopril-hydrochlorothiazide (PRINZIDE,ZESTORETIC) 20-12.5 MG per tablet Take 0.5 tablets by mouth daily.  45 tablet  3  . magnesium oxide (MAG-OX) 400 MG tablet Take 400 mg by mouth daily.       Marland Kitchen oxyCODONE (OXY IR/ROXICODONE) 5 MG immediate release tablet Take 1 1/2 tabs by mouth every 6 hours as needed for pain  90 tablet  0  . OxyCODONE (OXYCONTIN) 10 mg T12A 12 hr tablet Take 1 tablet (10 mg total) by mouth every 12 (twelve) hours.  60 tablet  0  . pomalidomide (POMALYST) 4 MG capsule Take 1 capsule (4 mg total) by mouth daily. Take with water on days 1-21. Repeat every 28 days 06/30/13-pomalyst authorization [id number]  adult female not of childbearing potential  21 capsule  0  . potassium chloride SA (K-DUR,KLOR-CON) 20 MEQ tablet Take 20 mEq by mouth daily as needed.      . predniSONE (DELTASONE) 20 MG tablet Take 1 tablet (20 mg total) by mouth once a week. On Monday.  Take with food.  12 tablet  1  . prochlorperazine (COMPAZINE) 10 MG tablet Take 1 tablet (10 mg total) by mouth every 6 (six) hours as needed.  30 tablet  1  . temazepam (RESTORIL) 30 MG capsule TAKE ONE CAPSULE BY MOUTH AT BEDTIME prn  30 capsule  0  . warfarin (COUMADIN) 5 MG tablet TAKE ONE TABLET BY MOUTH ONCE DAILY ON TUESDAY, THURSDAY AND SATURDAY.  TAKE 1/2 TABLET ON ALL OTHER DAYS OR AS DIRECTED  60 tablet  0   No current facility-administered medications for this visit.   Facility-Administered Medications Ordered in Other Visits  Medication Dose Route Frequency Provider Last Rate Last Dose  . Zoledronic Acid (ZOMETA) 4 mg IVPB  4 mg Intravenous Once Si Gaul, MD        SURGICAL HISTORY:  Past Surgical History  Procedure Laterality Date  . Abdominal hysterectomy      REVIEW OF SYSTEMS:  Constitutional: positive for fatigue Eyes: negative Ears, nose, mouth, throat, and face: negative Respiratory: negative Cardiovascular: negative Gastrointestinal:  negative Genitourinary:negative Integument/breast: negative Hematologic/lymphatic: negative Musculoskeletal:negative Neurological: positive for paresthesia Behavioral/Psych: negative Endocrine: negative Allergic/Immunologic: negative   PHYSICAL EXAMINATION: General appearance: alert, cooperative, fatigued and no distress Head: Normocephalic, without obvious abnormality, atraumatic Neck: no adenopathy, no JVD, supple, symmetrical, trachea midline and thyroid not enlarged, symmetric, no tenderness/mass/nodules Lymph nodes: Cervical, supraclavicular, and axillary nodes normal. Resp: clear to auscultation bilaterally Back: symmetric, no curvature. ROM normal. No CVA tenderness. Cardio: regular rate and rhythm, S1, S2 normal, no murmur, click, rub or gallop GI: soft, non-tender; bowel sounds normal; no masses,  no organomegaly Extremities: extremities normal, atraumatic, no cyanosis or edema Neurologic:  Alert and oriented X 3, normal strength and tone. Normal symmetric reflexes. Normal coordination and gait  ECOG PERFORMANCE STATUS: 2 - Symptomatic, <50% confined to bed  Blood pressure 153/66, pulse 53, temperature 97.8 F (36.6 C), temperature source Oral, resp. rate 18, height 5' (1.524 m), weight 134 lb 1.6 oz (60.827 kg).  LABORATORY DATA: Lab Results  Component Value Date   WBC 4.6 07/21/2013   HGB 11.6 07/21/2013   HCT 35.4 07/21/2013   MCV 97.5 07/21/2013   PLT 211 07/21/2013      Chemistry      Component Value Date/Time   NA 143 07/21/2013 1436   NA 141 06/24/2012 1620   K 3.5 07/21/2013 1436   K 3.1* 06/24/2012 1620   CL 107 02/14/2013 1311   CL 101 06/24/2012 1620   CO2 27 07/21/2013 1436   CO2 32 06/24/2012 1620   BUN 17.0 07/21/2013 1436   BUN 15 06/24/2012 1620   CREATININE 1.0 07/21/2013 1436   CREATININE 0.96 06/24/2012 1620      Component Value Date/Time   CALCIUM 10.0 07/21/2013 1436   CALCIUM 9.4 06/24/2012 1620   CALCIUM  Value: 5.7 CRITICAL RESULT  CALLED TO, READ BACK BY AND VERIFIED WITH: JAMIE TRACY,RN [id number] @ 1433 BY J SCOTTON (NOTE)  Amended report. Result repeated and verified. CORRECTED ON 10/14 AT 1429: PREVIOUSLY REPORTED AS Result repeated and verified.* 06/13/2008 1605   ALKPHOS 50 07/21/2013 1436   ALKPHOS 50 06/24/2012 1620   AST 16 07/21/2013 1436   AST 19 06/24/2012 1620   ALT 21 07/21/2013 1436   ALT 15 06/24/2012 1620   BILITOT 0.40 07/21/2013 1436   BILITOT 0.2* 06/24/2012 1620       RADIOGRAPHIC STUDIES: No results found.  ASSESSMENT AND PLAN: This is a very pleasant 77 years old African American female with multiple myeloma currently treatment was Pomalyst 4 mg by mouth daily for 21 days every 4 weeks status post 6 cycles. The patient is tolerating her treatment fairly well with no significant evidence for disease progression except for mild increase in the free lambda light chain. Patient was discussed with them also seen by Dr. [name]. She will continue on  Pomalyst and Decadron at the current doses. She will followup with the Coumadin clinic as scheduled. She'll followup in one month for a symptom management visit with a repeat CBC differential, C. met and LDH. She requests refill for her Xanax tablets and this was refilled for a total of 2 tablets daily total of 60 with no refill also at its current dose. She'll continue on Zometa given every 2 months, next dose due 09/02/2013.  Conni Slipper, PA-C   The patient voices understanding of current disease status and treatment options and is in agreement with the current care plan.  All questions were answered. The patient knows to call the clinic with any problems, questions or concerns. We can certainly see the patient much sooner if necessary.  ADDENDUM: Hematology/Oncology Attending: I had a face to face encounter with the patient. I recommended her care plan. The patient is doing fine today with no specific complaints. She is tolerating her treatment  with Pomalyst fairly well with no significant adverse effects. She continues to have the peripheral neuropathy and currently on treatment with Neurontin. She denied having any significant fever or chills. She denied having any nausea or vomiting. She will continue her treatment with Pomalyst as scheduled. The patient would come back for followup visit in one  month's for reevaluation and repeat blood work. She was advised to call immediately if she has any concerning symptoms in the interval. Lajuana Matte., MD 07/24/2013

## 2013-07-21 NOTE — Patient Instructions (Signed)
Continue your Pomalyst and Decoadron as prescribed Follow up with th Coumadin Clinic as scheduled Follow up in 1 month

## 2013-07-22 ENCOUNTER — Telehealth: Payer: Self-pay | Admitting: Internal Medicine

## 2013-07-22 NOTE — Telephone Encounter (Signed)
S/w the pt's daughter kim regarding the change in the appt time on 08/11/2013 from 2:00pm to 10:45am for the lab,md and the coumadin appt.

## 2013-07-25 ENCOUNTER — Other Ambulatory Visit: Payer: Self-pay | Admitting: *Deleted

## 2013-07-25 DIAGNOSIS — C9000 Multiple myeloma not having achieved remission: Secondary | ICD-10-CM

## 2013-07-25 MED ORDER — POMALIDOMIDE 4 MG PO CAPS: 4.0000 mg | ORAL_CAPSULE | Freq: Every day | ORAL | Status: DC

## 2013-07-26 NOTE — Telephone Encounter (Signed)
RECEIVED A FAX FROM [employer] CONCERNING A CONFIRMATION OF PRESCRIPTION SHIPMENT FOR POMALYST ON 07/25/13.

## 2013-08-04 ENCOUNTER — Ambulatory Visit: Payer: Medicare Other

## 2013-08-08 ENCOUNTER — Other Ambulatory Visit: Payer: Self-pay | Admitting: Internal Medicine

## 2013-08-08 ENCOUNTER — Other Ambulatory Visit: Payer: Self-pay | Admitting: Medical Oncology

## 2013-08-08 DIAGNOSIS — C9000 Multiple myeloma not having achieved remission: Secondary | ICD-10-CM

## 2013-08-08 MED ORDER — OXYCODONE HCL ER 10 MG PO T12A: 10.0000 mg | EXTENDED_RELEASE_TABLET | Freq: Two times a day (BID) | ORAL | Status: DC

## 2013-08-08 MED ORDER — OXYCODONE HCL 5 MG PO TABS: ORAL_TABLET | ORAL | Status: DC

## 2013-08-08 NOTE — Telephone Encounter (Signed)
refill for pain med locked up.

## 2013-08-11 ENCOUNTER — Ambulatory Visit (HOSPITAL_BASED_OUTPATIENT_CLINIC_OR_DEPARTMENT_OTHER): Payer: Medicare Other | Admitting: Internal Medicine

## 2013-08-11 ENCOUNTER — Ambulatory Visit (HOSPITAL_BASED_OUTPATIENT_CLINIC_OR_DEPARTMENT_OTHER): Payer: Self-pay | Admitting: Pharmacist

## 2013-08-11 ENCOUNTER — Encounter (INDEPENDENT_AMBULATORY_CARE_PROVIDER_SITE_OTHER): Payer: Self-pay

## 2013-08-11 ENCOUNTER — Other Ambulatory Visit: Payer: Self-pay | Admitting: Internal Medicine

## 2013-08-11 ENCOUNTER — Encounter: Payer: Self-pay | Admitting: Internal Medicine

## 2013-08-11 ENCOUNTER — Telehealth: Payer: Self-pay | Admitting: Internal Medicine

## 2013-08-11 ENCOUNTER — Other Ambulatory Visit (HOSPITAL_BASED_OUTPATIENT_CLINIC_OR_DEPARTMENT_OTHER): Payer: Medicare Other

## 2013-08-11 VITALS — BP 148/62 | HR 77 | Temp 97.5°F | Resp 17 | Ht 60.0 in | Wt 134.6 lb

## 2013-08-11 DIAGNOSIS — I82409 Acute embolism and thrombosis of unspecified deep veins of unspecified lower extremity: Secondary | ICD-10-CM

## 2013-08-11 DIAGNOSIS — I82401 Acute embolism and thrombosis of unspecified deep veins of right lower extremity: Secondary | ICD-10-CM

## 2013-08-11 DIAGNOSIS — Z7901 Long term (current) use of anticoagulants: Secondary | ICD-10-CM

## 2013-08-11 DIAGNOSIS — C9000 Multiple myeloma not having achieved remission: Secondary | ICD-10-CM

## 2013-08-11 LAB — COMPREHENSIVE METABOLIC PANEL (CC13)
ALT: 18 U/L (ref 0–55)
AST: 15 U/L (ref 5–34)
Alkaline Phosphatase: 55 U/L (ref 40–150)
Anion Gap: 11 mEq/L (ref 3–11)
CO2: 28 mEq/L (ref 22–29)
Calcium: 9.9 mg/dL (ref 8.4–10.4)
Creatinine: 1 mg/dL (ref 0.6–1.1)
Sodium: 142 mEq/L (ref 136–145)
Total Bilirubin: 0.37 mg/dL (ref 0.20–1.20)
Total Protein: 6.4 g/dL (ref 6.4–8.3)

## 2013-08-11 LAB — CBC WITH DIFFERENTIAL/PLATELET
BASO%: 1.4 % (ref 0.0–2.0)
EOS%: 2.2 % (ref 0.0–7.0)
HCT: 32.1 % — ABNORMAL LOW (ref 34.8–46.6)
LYMPH%: 13.6 % — ABNORMAL LOW (ref 14.0–49.7)
MCH: 32.3 pg (ref 25.1–34.0)
MCHC: 33.2 g/dL (ref 31.5–36.0)
MCV: 97.3 fL (ref 79.5–101.0)
MONO#: 0.5 10*3/uL (ref 0.1–0.9)
MONO%: 9.9 % (ref 0.0–14.0)
NEUT#: 3.4 10*3/uL (ref 1.5–6.5)
NEUT%: 72.9 % (ref 38.4–76.8)
Platelets: 262 10*3/uL (ref 145–400)
RBC: 3.3 10*6/uL — ABNORMAL LOW (ref 3.70–5.45)
WBC: 4.7 10*3/uL (ref 3.9–10.3)

## 2013-08-11 LAB — PROTIME-INR: Protime: 22.8 Seconds — ABNORMAL HIGH (ref 10.6–13.4)

## 2013-08-11 MED ORDER — POTASSIUM CHLORIDE CRYS ER 20 MEQ PO TBCR: 20.0000 meq | EXTENDED_RELEASE_TABLET | Freq: Every day | ORAL | Status: DC | PRN

## 2013-08-11 NOTE — Progress Notes (Signed)
[facility] [phone]) [phone]   [phone]  OFFICE PROGRESS NOTE  [name], MD [address] [place] [address]  DIAGNOSIS:  1. multiple myeloma diagnosed in September 2009,  2. history of bilateral lower extremity deep vein thrombosis diagnosed in November 2009  3. acute on chronic deep vein thrombosis in the left lower extremity diagnosed in October 2010.   PRIOR THERAPY:  1. status post palliative radiotherapy to the right femur and is she'll tuberosity, first was done in December 2011 and the second treatment was completed Jan 08 2011, and the third treatment to the right distal femur completed on 04/30/2011  2. status post palliative radiotherapy to the left proximal femur completed 02/28/2011.  3. Revlimid 25 mg by mouth daily for 21 days every 4 weeks in addition to oral Decadron at 40 mg by mouth on a weekly basis status post 40 cycles.  4. weekly subcutaneous Velcade 1.3 mg/M2 status post 60 cycles, discontinued today secondary to mild disease progression.   CURRENT THERAPY:  1. Pomalyst 4 mg by mouth daily for 21 days every 4 weeks, the patient started the first dose on 12/22/2012 , in addition to Decadron 40 mg weekly. Status post 7 cycles. She will start cycle #8 on 08/22/2013. 2. Coumadin 5 mg on Mondays and Thursdays and 2.5 mg on all other days.  3. Zometa 4 mg IV given every 2 months.    INTERVAL HISTORY: [name] 77 y.o. female returns to the clinic today for followup visit accompanied by her daughter. The patient continues to complain of increasing fatigue and low back pain and peripheral neuropathy in the lower extremities. She is tolerating her treatment with Pomalyst fairly well. She denied having any significant nausea or vomiting. The patient denied having any significant weight loss or night sweats. She has no chest pain, shortness of breath, cough or hemoptysis. She has no fever or chills, no nausea or  vomiting.  MEDICAL HISTORY: Past Medical History  Diagnosis Date  . Blood transfusion   . Depression     Mild  . History of chicken pox   . Hyperlipidemia   . Hypertension   . Degenerative joint disease   . Diabetes mellitus     Steroid related  . Phlebitis   . Multiple myeloma     per Dr. [name], s/p palliative radiation for leg pain 2011 per Dr. Roselind Messier  . Deep vein thrombosis of bilateral lower extremities     ALLERGIES:  has No Known Allergies.  MEDICATIONS:  Current Outpatient Prescriptions  Medication Sig Dispense Refill  . acyclovir (ZOVIRAX) 400 MG tablet TAKE ONE TABLET BY MOUTH TWICE DAILY  60 tablet  0  . ALPRAZolam (XANAX) 0.5 MG tablet Take 1 tablet (0.5 mg total) by mouth 2 (two) times daily as needed for anxiety.  60 tablet  0  . Calcium Carbonate-Vitamin D 600-400 MG-UNIT per tablet Take 1 tablet by mouth 3 (three) times daily with meals.        Marland Kitchen dexamethasone (DECADRON) 4 MG tablet TAKE TEN TABLETS BY MOUTH ONCE PER WEEK  40 tablet  0  . gabapentin (NEURONTIN) 100 MG capsule TAKE ONE TO FOUR CAPSULES BY MOUTH THREE TIMES DAILY  360 capsule  3  . glipiZIDE (GLUCOTROL XL) 2.5 MG 24 hr tablet Take 1 tablet (2.5 mg total) by mouth daily.  90 tablet  3  . glucose blood (ONE TOUCH ULTRA TEST) test strip Use as instructed  25 each  6  . ibuprofen (ADVIL,MOTRIN) 200 MG tablet Take 200 mg by mouth every 6 (six) hours as needed.        . Lancets (ONETOUCH ULTRASOFT) lancets Test blood sugar once daily or as directed.  100 each  11  . lidocaine (LIDODERM) 5 % Place 1 patch onto the skin daily. Remove & Discard patch within 12 hours or as directed by MD  30 patch  0  . magnesium oxide (MAG-OX) 400 MG tablet Take 400 mg by mouth daily.       Marland Kitchen oxyCODONE (OXY IR/ROXICODONE) 5 MG immediate release tablet Take 1 1/2 tabs by mouth every 6 hours as needed for pain  90 tablet  0  . OxyCODONE (OXYCONTIN) 10 mg T12A 12 hr tablet Take 1 tablet (10 mg total) by mouth every 12 (twelve)  hours.  60 tablet  0  . pomalidomide (POMALYST) 4 MG capsule Take 1 capsule (4 mg total) by mouth daily. Take with water on days 1-21. Repeat every 28 days 06/30/13-pomalyst authorization [id number]  adult female not of childbearing potential  21 capsule  0  . prochlorperazine (COMPAZINE) 10 MG tablet Take 1 tablet (10 mg total) by mouth every 6 (six) hours as needed.  30 tablet  1  . temazepam (RESTORIL) 30 MG capsule TAKE ONE CAPSULE BY MOUTH AT BEDTIME prn  30 capsule  0  . warfarin (COUMADIN) 5 MG tablet Take one by mouth daily on Monday & Wednesday. Take 1/2 tablet on all other days or as directed  60 tablet  0  . lisinopril-hydrochlorothiazide (PRINZIDE,ZESTORETIC) 20-12.5 MG per tablet Take 0.5 tablets by mouth daily.  45 tablet  3   No current facility-administered medications for this visit.   Facility-Administered Medications Ordered in Other Visits  Medication Dose Route Frequency Provider Last Rate Last Dose  . Zoledronic Acid (ZOMETA) 4 mg IVPB  4 mg Intravenous Once Si Gaul, MD        SURGICAL HISTORY:  Past Surgical History  Procedure Laterality Date  . Abdominal hysterectomy      REVIEW OF SYSTEMS:  Constitutional: positive for fatigue Eyes: negative Ears, nose, mouth, throat, and face: negative Respiratory: negative Cardiovascular: negative Gastrointestinal: negative Genitourinary:negative Integument/breast: negative Hematologic/lymphatic: negative Musculoskeletal:positive for back pain Neurological: positive for paresthesia Behavioral/Psych: negative Endocrine: negative Allergic/Immunologic: negative   PHYSICAL EXAMINATION: General appearance: alert, cooperative, fatigued and no distress Head: Normocephalic, without obvious abnormality, atraumatic Neck: no adenopathy, no JVD, supple, symmetrical, trachea midline and thyroid not enlarged, symmetric, no tenderness/mass/nodules Lymph nodes: Cervical, supraclavicular, and axillary nodes normal. Resp:  clear to auscultation bilaterally Back: symmetric, no curvature. ROM normal. No CVA tenderness. Cardio: regular rate and rhythm, S1, S2 normal, no murmur, click, rub or gallop GI: soft, non-tender; bowel sounds normal; no masses,  no organomegaly Extremities: extremities normal, atraumatic, no cyanosis or edema Neurologic: Alert and oriented X 3, normal strength and tone. Normal symmetric reflexes. Normal coordination and gait  ECOG PERFORMANCE STATUS: 2 - Symptomatic, <50% confined to bed  Blood pressure 148/62, pulse 77, temperature 97.5 F (36.4 C), temperature source Oral, resp. rate 17, height 5' (1.524 m), weight 134 lb 9.6 oz (61.054 kg), SpO2 98.00%.  LABORATORY DATA: Lab Results  Component Value Date   WBC 4.7 08/11/2013   HGB 10.6* 08/11/2013   HCT 32.1* 08/11/2013   MCV 97.3 08/11/2013   PLT 262 08/11/2013      Chemistry      Component Value Date/Time   NA 142  08/11/2013 1103   NA 141 06/24/2012 1620   K 2.9* 08/11/2013 1103   K 3.1* 06/24/2012 1620   CL 107 02/14/2013 1311   CL 101 06/24/2012 1620   CO2 28 08/11/2013 1103   CO2 32 06/24/2012 1620   BUN 13.4 08/11/2013 1103   BUN 15 06/24/2012 1620   CREATININE 1.0 08/11/2013 1103   CREATININE 0.96 06/24/2012 1620      Component Value Date/Time   CALCIUM 9.9 08/11/2013 1103   CALCIUM 9.4 06/24/2012 1620   CALCIUM  Value: 5.7 CRITICAL RESULT CALLED TO, READ BACK BY AND VERIFIED WITH: JAMIE TRACY,RN [id number] @ 1433 BY J SCOTTON (NOTE)  Amended report. Result repeated and verified. CORRECTED ON 10/14 AT 1429: PREVIOUSLY REPORTED AS Result repeated and verified.* 06/13/2008 1605   ALKPHOS 55 08/11/2013 1103   ALKPHOS 50 06/24/2012 1620   AST 15 08/11/2013 1103   AST 19 06/24/2012 1620   ALT 18 08/11/2013 1103   ALT 15 06/24/2012 1620   BILITOT 0.37 08/11/2013 1103   BILITOT 0.2* 06/24/2012 1620       RADIOGRAPHIC STUDIES: No results found.  ASSESSMENT AND PLAN: This is a very pleasant 77 years old African  American female with multiple myeloma currently treatment was Pomalyst 4 mg by mouth daily for 21 days every 4 weeks status post 7 cycles. The patient is tolerating her treatment fairly well.  I recommended for her to continue on Pomalyst with the same dose. For the persistent low back pain, I referred the patient to Dr. Roselind Messier for consideration of palliative radiotherapy to this area. She will also continue on Zometa every 2 months as scheduled. She would come back for followup visit in one month for reevaluation with repeat CBC, comprehensive metabolic panel and LDH and myeloma panel. The patient voices understanding of current disease status and treatment options and is in agreement with the current care plan.  All questions were answered. The patient knows to call the clinic with any problems, questions or concerns. We can certainly see the patient much sooner if necessary.

## 2013-08-11 NOTE — Telephone Encounter (Signed)
gv adn prited aptp sched and avs for pt for DEC and Jan 2015 pt sched to see Dr. Roselind Messier 12.24 @ 10:30am

## 2013-08-11 NOTE — Progress Notes (Signed)
INR = 1.9    Goal range 2-3 INR just below goal range. Patient has been stable at the current dose. Patient had 1 helping of greens on Saturday, no other dietary changes. No medication changes. No bleeding/bruising. Patient brought in by very pleasant family member today Sue Lush) who requests we coordinate future appointments with other appointments when possible. Will coordinate next appointment to coincide with Zometa infusion. Patient will return 09/02/13 for lab at 2:15 and infusion appt at 2:30.  We will see her during the infusion appt. She will continue Coumadin 2.5 mg daily except 5 mg Mon and Wed.  Cletis Athens, PharmD  NO CHARGE - MD visit today

## 2013-08-12 ENCOUNTER — Other Ambulatory Visit: Payer: Self-pay | Admitting: Internal Medicine

## 2013-08-12 NOTE — Patient Instructions (Signed)
Follow up visit in one month with repeat myeloma panel 

## 2013-08-17 ENCOUNTER — Other Ambulatory Visit: Payer: Self-pay | Admitting: *Deleted

## 2013-08-17 NOTE — Telephone Encounter (Signed)
Refill request for Pomalyst 4 mg to MD desk for approval. 

## 2013-08-19 ENCOUNTER — Other Ambulatory Visit: Payer: Self-pay | Admitting: *Deleted

## 2013-08-19 DIAGNOSIS — C9000 Multiple myeloma not having achieved remission: Secondary | ICD-10-CM

## 2013-08-19 MED ORDER — POMALIDOMIDE 4 MG PO CAPS: 4.0000 mg | ORAL_CAPSULE | Freq: Every day | ORAL | Status: DC

## 2013-08-22 ENCOUNTER — Other Ambulatory Visit: Payer: Self-pay | Admitting: Physician Assistant

## 2013-08-22 ENCOUNTER — Other Ambulatory Visit: Payer: Self-pay | Admitting: Internal Medicine

## 2013-08-22 ENCOUNTER — Other Ambulatory Visit: Payer: Self-pay | Admitting: Family Medicine

## 2013-08-22 ENCOUNTER — Telehealth: Payer: Self-pay | Admitting: *Deleted

## 2013-08-22 DIAGNOSIS — C9000 Multiple myeloma not having achieved remission: Secondary | ICD-10-CM

## 2013-08-22 DIAGNOSIS — E876 Hypokalemia: Secondary | ICD-10-CM

## 2013-08-22 MED ORDER — POTASSIUM CHLORIDE CRYS ER 20 MEQ PO TBCR: 20.0000 meq | EXTENDED_RELEASE_TABLET | Freq: Two times a day (BID) | ORAL | Status: DC

## 2013-08-22 NOTE — Telephone Encounter (Signed)
Message copied by Raphael Gibney on Mon Aug 22, 2013  9:26 AM ------      Message from: Conni Slipper      Created: Mon Aug 22, 2013  9:21 AM       Abnormal results, please call in following prescription and notify patient. K-Dur 20 meq by mouth twice a day for 10 days. #20 with no refills ------

## 2013-08-22 NOTE — Telephone Encounter (Signed)
No need to refill potassium.  Spoke with daughter Sue Lush and a potassium content in food list will be at check in for pick up on 08-24-2013 for patient use.

## 2013-08-22 NOTE — Progress Notes (Addendum)
Histology and Location of Primary Cancer: multiple myeloma  Sites of Visceral and Bony Metastatic Disease:  Right femur, left femur  Location(s) of Symptomatic Metastases: low back pain  Past/Anticipated chemotherapy by medical oncology, if any:  Revlimid 25 mg by mouth daily for 21 days every 4 weeks in addition to oral Decadron at 40 mg by mouth on a weekly basis status post 40 cycles.  weekly subcutaneous Velcade 1.3 mg/M2 status post 60 cycles, discontinued today secondary to mild disease progression.  1. Pomalyst 4 mg by mouth daily for 21 days every 4 weeks, the patient started the first dose on 12/22/2012 , in addition to Decadron 40 mg weekly. Status post 7 cycles. She will start cycle #8 on 08/22/2013.  2. Coumadin 5 mg on Mondays and Thursdays and 2.5 mg on all other days.  3. Zometa 4 mg IV given every 2 months.    Pain on a scale of 0-10 is: 6 in her left flank/lower back.  She is taking short and long acting oxycodone.   If Spine Met(s), symptoms, if any, include:  Bowel/Bladder retention or incontinence (please describe): occasional diarrhea  Numbness or weakness in extremities (please describe): both of her legs are weak  Current Decadron regimen, if applicable: 40 mg weekly on Fridays  Ambulatory status? Walker? Wheelchair?: walker  SAFETY ISSUES: Prior radiation? Yes - 12/16/11 -12/29/11 Right humerus, 3000 cGy in 10 fractions, 04/17/11-04/20/11 Right distal femur 2000 cGy, 02/19/11-02/28/11 left proximal femur 2800 cGy, 12/30/10-01/08/11 Right femur 2000 cGy, 08/05/10-08/29/10 Right femur and ischial tuberosity 2500 cGy  Pacemaker/ICD? no  Possible current pregnancy? no  Is the patient on methotrexate? no  Current Complaints / other details:  [name], [name] is here with her two daughters today.   She has a cold with runny nose and a cough that started on Sunday.

## 2013-08-22 NOTE — Telephone Encounter (Signed)
Spoke with patient's daughter Selena Batten) and informed her that patient's potassium is low and that a prescription for potassium was sent to her pharmacy.  Per Tiana Loft, PA.  Patient's daughter verbalized understanding.

## 2013-08-23 NOTE — Telephone Encounter (Signed)
RECEIVED A FAX FROM [facility] CONCERNING A CONFIRMATION OF PRESCRIPTION SHIPMENT FOR POMALYST ON 08/22/13.

## 2013-08-24 ENCOUNTER — Ambulatory Visit
Admission: RE | Admit: 2013-08-24 | Discharge: 2013-08-24 | Disposition: A | Discharge status: Home / Self Care | Payer: Medicare Other | Source: Ambulatory Visit | Attending: Radiation Oncology | Admitting: Radiation Oncology

## 2013-08-24 ENCOUNTER — Telehealth: Payer: Self-pay | Admitting: Medical Oncology

## 2013-08-24 ENCOUNTER — Encounter: Payer: Self-pay | Admitting: Radiation Oncology

## 2013-08-24 VITALS — BP 122/75 | HR 86 | Temp 98.1°F | Ht 60.0 in | Wt 130.7 lb

## 2013-08-24 DIAGNOSIS — M25559 Pain in unspecified hip: Secondary | ICD-10-CM | POA: Insufficient documentation

## 2013-08-24 DIAGNOSIS — M545 Low back pain, unspecified: Secondary | ICD-10-CM | POA: Insufficient documentation

## 2013-08-24 DIAGNOSIS — C9000 Multiple myeloma not having achieved remission: Secondary | ICD-10-CM

## 2013-08-24 NOTE — Telephone Encounter (Signed)
clarig

## 2013-08-24 NOTE — Progress Notes (Signed)
Please see the Nurse Progress Note in the MD Initial Consult Encounter for this patient. 

## 2013-08-24 NOTE — Progress Notes (Signed)
Radiation Oncology         [phone] ________________________________  Name: [name] MRN: [id number]  Date: 08/24/2013  DOB: April 17, 1936  Follow-Up Visit Note/reevaluation note  CC: [name], MD  Si Gaul, MD  Diagnosis:   Multiple myeloma  Interval Since Last Radiation:  One year and 8 months  Narrative:  The patient returns today for further evaluation at the courtesy of Dr. [name] and consideration for additional radiation therapy as part of management of patient's multiple myeloma.   The patient has most recently been having pain in the low back region with some radiation of pain into the left pelvis and leg area. No reports of muscle weakness. Patient does ambulate with the assistance of a walker.                         ALLERGIES:  has No Known Allergies.  Meds: Current Outpatient Prescriptions  Medication Sig Dispense Refill  . acyclovir (ZOVIRAX) 400 MG tablet TAKE ONE TABLET BY MOUTH TWICE DAILY  60 tablet  1  . ALPRAZolam (XANAX) 0.5 MG tablet TAKE ONE TABLET BY MOUTH TWICE DAILY AS NEEDED FOR ANXIETY  60 tablet  1  . Calcium Carbonate-Vitamin D 600-400 MG-UNIT per tablet Take 1 tablet by mouth 3 (three) times daily with meals.        Marland Kitchen dexamethasone (DECADRON) 4 MG tablet TAKE 10 TABLETS BY MOUTH ONCE PER WEEK  40 tablet  0  . gabapentin (NEURONTIN) 100 MG capsule TAKE ONE TO FOUR CAPSULES BY MOUTH THREE TIMES DAILY  360 capsule  3  . glipiZIDE (GLUCOTROL XL) 2.5 MG 24 hr tablet Take 1 tablet (2.5 mg total) by mouth daily.  90 tablet  3  . glucose blood (ONE TOUCH ULTRA TEST) test strip Use as instructed  25 each  6  . ibuprofen (ADVIL,MOTRIN) 200 MG tablet Take 200 mg by mouth every 6 (six) hours as needed.        . Lancets (ONETOUCH ULTRASOFT) lancets Test blood sugar once daily or as directed.  100 each  11  . lisinopril-hydrochlorothiazide (PRINZIDE,ZESTORETIC) 20-12.5 MG per tablet TAKE ONE-HALF TABLET BY MOUTH EVERY DAY  45 tablet  1  .  magnesium oxide (MAG-OX) 400 MG tablet Take 400 mg by mouth daily.       Marland Kitchen oxyCODONE (OXY IR/ROXICODONE) 5 MG immediate release tablet Take 1 1/2 tabs by mouth every 6 hours as needed for pain  90 tablet  0  . OxyCODONE (OXYCONTIN) 10 mg T12A 12 hr tablet Take 1 tablet (10 mg total) by mouth every 12 (twelve) hours.  60 tablet  0  . pomalidomide (POMALYST) 4 MG capsule Take 1 capsule (4 mg total) by mouth daily. Take with water on days 1-21. Repeat every 28 days.   Last survey   06/06/13.   Authorization  #   [id number]   08/19/13. Adult female not of reproductive potential.  21 capsule  0  . potassium chloride SA (K-DUR,KLOR-CON) 20 MEQ tablet Take 1 tablet (20 mEq total) by mouth daily as needed.  10 tablet  0  . potassium chloride SA (K-DUR,KLOR-CON) 20 MEQ tablet Take 1 tablet (20 mEq total) by mouth 2 (two) times daily. For 10 days  20 tablet  0  . predniSONE (DELTASONE) 20 MG tablet Take 20 mg by mouth once a week.      . prochlorperazine (COMPAZINE) 10 MG tablet Take 1 tablet (10 mg total) by mouth  every 6 (six) hours as needed.  30 tablet  1  . temazepam (RESTORIL) 30 MG capsule TAKE ONE CAPSULE BY MOUTH AT BEDTIME prn  30 capsule  0  . warfarin (COUMADIN) 5 MG tablet Take one by mouth daily on Monday & Wednesday. Take 1/2 tablet on all other days or as directed  60 tablet  0   No current facility-administered medications for this encounter.   Facility-Administered Medications Ordered in Other Encounters  Medication Dose Route Frequency Provider Last Rate Last Dose  . Zoledronic Acid (ZOMETA) 4 mg IVPB  4 mg Intravenous Once Si Gaul, MD        Physical Findings: The patient is in no acute distress. Patient is alert and oriented.  height is 5' (1.524 m) and weight is 130 lb 11.2 oz (59.285 kg). Her temperature is 98.1 F (36.7 C). Her blood pressure is 122/75 and her pulse is 86. Her oxygen saturation is 100%. . The lungs are clear. The heart has a regular rhythm and rate. The  abdomen is soft and nontender with normal bowel sounds.  Lower motor strength appears to be symmetric with overall muscle strength diminished at approximately 3/5 to  4/5 in the proximal and distal muscle groups.  The patient complains of pain in the lower back with some radiation of pain into the left pelvis region.  Lab Findings: Lab Results  Component Value Date   WBC 4.7 08/11/2013   HGB 10.6* 08/11/2013   HCT 32.1* 08/11/2013   MCV 97.3 08/11/2013   PLT 262 08/11/2013      Radiographic Findings: No results found.  Impression:  The patient would be a good candidate for additional palliative radiation therapy. She has not had recent scans of the lumbosacral area since July of this year. These scans did show sacral lesion with diffuse myelomatous involvement.  This is likely explaining the patient's symptoms.  The patient has received previous treatment to this area in 2009 (25 gray in 10 fractions). Given this modest dose she would be a candidate for additional radiation therapy directed to this region. I discussed the treatment course side effects and potential toxicities of retreatment with the patient and her family. Patient appears to understand and wishes to proceed with planned course of treatment.  Plan:  Simulation and planning on December 26 with treatments to begin December 30. Anticipate between 8 and 10 treatments.  ____________________________________ Billie Lade, MD

## 2013-08-26 ENCOUNTER — Ambulatory Visit
Admission: RE | Admit: 2013-08-26 | Discharge: 2013-08-26 | Disposition: A | Discharge status: Home / Self Care | Payer: Medicare Other | Source: Ambulatory Visit | Attending: Radiation Oncology | Admitting: Radiation Oncology

## 2013-08-26 ENCOUNTER — Ambulatory Visit: Payer: Medicare Other

## 2013-08-26 DIAGNOSIS — M545 Low back pain, unspecified: Secondary | ICD-10-CM | POA: Insufficient documentation

## 2013-08-26 DIAGNOSIS — Z79899 Other long term (current) drug therapy: Secondary | ICD-10-CM | POA: Insufficient documentation

## 2013-08-26 DIAGNOSIS — Z510 Encounter for antineoplastic radiation therapy: Secondary | ICD-10-CM | POA: Insufficient documentation

## 2013-08-26 DIAGNOSIS — M25559 Pain in unspecified hip: Secondary | ICD-10-CM | POA: Insufficient documentation

## 2013-08-26 DIAGNOSIS — C9000 Multiple myeloma not having achieved remission: Secondary | ICD-10-CM | POA: Insufficient documentation

## 2013-08-26 NOTE — Progress Notes (Signed)
  Radiation Oncology         [phone] ________________________________  Name: Perrin Eddleman MRN: [id number]  Date: 08/26/2013  DOB: 12/07/1935  SIMULATION AND TREATMENT PLANNING NOTE  DIAGNOSIS:  Multiple myeloma  NARRATIVE:  The patient was brought to the CT Simulation planning suite.  Identity was confirmed.  All relevant records and images related to the planned course of therapy were reviewed.  The patient freely provided informed written consent to proceed with treatment after reviewing the details related to the planned course of therapy. The consent form was witnessed and verified by the simulation staff.  Then, the patient was set-up in a stable reproducible  supine position for radiation therapy.  CT images were obtained.  Surface markings were placed.  The CT images were loaded into the planning software.  Then the target and avoidance structures were contoured.  Treatment planning then occurred.  The radiation prescription was entered and confirmed.  Then, I designed and supervised the construction of a total of 5 medically necessary complex treatment devices.  I have requested : Isodose Plan.  I have ordered:dose calc.  PLAN:  The patient will receive 20 Gy in 10 fractions.  ________________________________  -----------------------------------  Billie Lade, PhD, MD

## 2013-08-30 ENCOUNTER — Telehealth: Payer: Self-pay | Admitting: *Deleted

## 2013-08-30 ENCOUNTER — Ambulatory Visit: Admission: RE | Admit: 2013-08-30 | Payer: Medicare Other | Source: Ambulatory Visit | Admitting: Radiation Oncology

## 2013-08-30 ENCOUNTER — Ambulatory Visit: Payer: Medicare Other

## 2013-08-30 ENCOUNTER — Ambulatory Visit
Admission: RE | Admit: 2013-08-30 | Discharge: 2013-08-30 | Disposition: A | Discharge status: Home / Self Care | Payer: Medicare Other | Source: Ambulatory Visit | Attending: Radiation Oncology | Admitting: Radiation Oncology

## 2013-08-30 ENCOUNTER — Other Ambulatory Visit: Payer: Self-pay | Admitting: *Deleted

## 2013-08-30 DIAGNOSIS — C9000 Multiple myeloma not having achieved remission: Secondary | ICD-10-CM

## 2013-08-30 MED ORDER — OXYCODONE HCL 5 MG PO TABS: ORAL_TABLET | ORAL | Status: DC

## 2013-08-30 NOTE — Telephone Encounter (Signed)
[name] called stating that pt has had continued congestion in her chest for a couple weeks.  She has been using mucinex.  Her sputum is a white/yellow/brown combination, no fevers noted.  Per Dr  Donnald Garre, recommend she continue mucinex and to keep watching for now but to call if anything changes.  SLJ

## 2013-08-31 ENCOUNTER — Ambulatory Visit
Admission: RE | Admit: 2013-08-31 | Discharge: 2013-08-31 | Disposition: A | Discharge status: Home / Self Care | Payer: Medicare Other | Source: Ambulatory Visit | Attending: Radiation Oncology | Admitting: Radiation Oncology

## 2013-08-31 DIAGNOSIS — C9000 Multiple myeloma not having achieved remission: Secondary | ICD-10-CM

## 2013-08-31 MED ORDER — RADIAPLEXRX EX GEL
Freq: Once | CUTANEOUS | Status: AC
  Administered 2013-08-31: 15:00:00 via TOPICAL

## 2013-08-31 NOTE — Progress Notes (Signed)
Jeffery Gammell here with her daughter for post sim education.  She was given the Radiation Therapy and You book and discussed side effects including fatigue, skin changes and nausea.  She was given radiaplex gel and was instructed to apply it to the treatment area twice a day, in the morning and at bedtime.  She was given my business card and encouraged to call with any questions or concerns.

## 2013-09-02 ENCOUNTER — Other Ambulatory Visit: Payer: Self-pay | Admitting: *Deleted

## 2013-09-02 ENCOUNTER — Ambulatory Visit
Admission: RE | Admit: 2013-09-02 | Discharge: 2013-09-02 | Disposition: A | Discharge status: Home / Self Care | Payer: Medicare Other | Source: Ambulatory Visit | Attending: Radiation Oncology | Admitting: Radiation Oncology

## 2013-09-02 ENCOUNTER — Ambulatory Visit: Payer: Medicare Other | Admitting: Pharmacist

## 2013-09-02 ENCOUNTER — Ambulatory Visit (HOSPITAL_BASED_OUTPATIENT_CLINIC_OR_DEPARTMENT_OTHER): Payer: Medicare Other

## 2013-09-02 ENCOUNTER — Other Ambulatory Visit: Payer: Self-pay | Admitting: Internal Medicine

## 2013-09-02 ENCOUNTER — Other Ambulatory Visit (HOSPITAL_BASED_OUTPATIENT_CLINIC_OR_DEPARTMENT_OTHER): Payer: Medicare Other

## 2013-09-02 VITALS — BP 104/68 | HR 79 | Temp 98.0°F

## 2013-09-02 DIAGNOSIS — I82401 Acute embolism and thrombosis of unspecified deep veins of right lower extremity: Secondary | ICD-10-CM

## 2013-09-02 DIAGNOSIS — I82409 Acute embolism and thrombosis of unspecified deep veins of unspecified lower extremity: Secondary | ICD-10-CM

## 2013-09-02 DIAGNOSIS — C9000 Multiple myeloma not having achieved remission: Secondary | ICD-10-CM

## 2013-09-02 LAB — POCT INR: INR: 1.4

## 2013-09-02 LAB — PROTIME-INR
INR: 1.4 — ABNORMAL LOW (ref 2.00–3.50)
Protime: 16.8 Seconds — ABNORMAL HIGH (ref 10.6–13.4)

## 2013-09-02 MED ORDER — ZOLEDRONIC ACID 4 MG/100ML IV SOLN
4.0000 mg | Freq: Once | INTRAVENOUS | Status: AC
  Administered 2013-09-02: 4 mg via INTRAVENOUS
  Filled 2013-09-02: qty 100

## 2013-09-02 MED ORDER — SODIUM CHLORIDE 0.9 % IV SOLN
Freq: Once | INTRAVENOUS | Status: AC
  Administered 2013-09-02: 15:00:00 via INTRAVENOUS

## 2013-09-02 NOTE — Progress Notes (Signed)
INR below goal today. No missed doses.  No changes in medications. New cycle of Pomalyst started ~ 08/30/13. Increased vitamin K intake, increased collard greens around the holidays. No s/s of clotting noted. No concerns regarding anticoagulation. Take Coumadin 5mg  today.  On 09/03/13, continue coumadin 2.5mg  daily except 5mg  on Mon&Wed.   Recheck PT/INR in 10 days on 09/12/13: lab at 12:30pm, coumadin clinic at 12:45pm and radiation at 2pm.

## 2013-09-02 NOTE — Patient Instructions (Signed)

## 2013-09-02 NOTE — Patient Instructions (Signed)
Take Coumadin 5mg  today.  On 09/03/13, continue coumadin 2.5mg  daily except 5mg  on Mon&Wed.   Recheck PT/INR in 10 days on 09/12/13: lab at 12:30pm, coumadin clinic at 12:45pm and radiation at 2pm.

## 2013-09-05 ENCOUNTER — Ambulatory Visit
Admission: RE | Admit: 2013-09-05 | Discharge: 2013-09-05 | Disposition: A | Discharge status: Home / Self Care | Payer: Medicare Other | Source: Ambulatory Visit | Attending: Radiation Oncology | Admitting: Radiation Oncology

## 2013-09-05 ENCOUNTER — Other Ambulatory Visit: Payer: Self-pay | Admitting: Family Medicine

## 2013-09-05 NOTE — Telephone Encounter (Signed)
Sent. See if she can get schedule here for a 6min visit in the next 2 months.  Thanks.

## 2013-09-05 NOTE — Telephone Encounter (Signed)
Left detailed message on voicemail of [name]'s cell phone.

## 2013-09-05 NOTE — Telephone Encounter (Signed)
Electronic refill request.  Please advise. 

## 2013-09-06 ENCOUNTER — Ambulatory Visit
Admission: RE | Admit: 2013-09-06 | Discharge: 2013-09-06 | Disposition: A | Discharge status: Home / Self Care | Payer: Medicare Other | Source: Ambulatory Visit | Attending: Radiation Oncology | Admitting: Radiation Oncology

## 2013-09-06 ENCOUNTER — Encounter: Payer: Self-pay | Admitting: Radiation Oncology

## 2013-09-06 ENCOUNTER — Other Ambulatory Visit: Payer: Self-pay | Admitting: Radiation Oncology

## 2013-09-06 ENCOUNTER — Other Ambulatory Visit: Payer: Self-pay | Admitting: *Deleted

## 2013-09-06 ENCOUNTER — Telehealth: Payer: Self-pay | Admitting: *Deleted

## 2013-09-06 ENCOUNTER — Inpatient Hospital Stay
Admission: RE | Admit: 2013-09-06 | Discharge: 2013-09-06 | Disposition: A | Discharge status: Home / Self Care | Payer: Medicare Other | Source: Ambulatory Visit | Attending: Radiation Oncology | Admitting: Radiation Oncology

## 2013-09-06 VITALS — BP 147/85 | HR 79 | Temp 99.6°F | Resp 20 | Wt 131.6 lb

## 2013-09-06 DIAGNOSIS — C9000 Multiple myeloma not having achieved remission: Secondary | ICD-10-CM

## 2013-09-06 MED ORDER — OXYCODONE HCL ER 10 MG PO T12A: 10.0000 mg | EXTENDED_RELEASE_TABLET | Freq: Two times a day (BID) | ORAL | Status: DC

## 2013-09-06 NOTE — Progress Notes (Signed)
[facility]     Rexene Edison, M.D. [place], [place] [address]               Blair Promise, M.D., Ph.D. Phone: [phone]      [place] A. Tammi Klippel, M.D. Fax: [phone]      Jodelle Gross, M.D., Ph.D.         Thea Silversmith, M.D.         Wyvonnia Lora, M.D Weekly Treatment Management Note  Name: [name]     MRN: [id number]        CSN: [id number] Date: 09/06/2013      DOB: 02-26-1936  CC: [name], MD         [name]    Status: Outpatient  Diagnosis: The encounter diagnosis was MULTIPLE  MYELOMA.  Current Dose: 10 Gy  Current Fraction: 5  Planned Dose: 20 Gy  Narrative: [name] was seen today for weekly treatment management. The chart was checked and port films  were reviewed. She is tolerating the treatments well this time. Her pain is unchanged however. She is having a lot of coughing does have a temperature of 99.6 today. Patient is somewhat hard to read in terms of her symptoms and I will order a chest x-ray to rule out pneumonia or other acute process.  Review of patient's allergies indicates no known allergies. Current Outpatient Prescriptions  Medication Sig Dispense Refill  . acyclovir (ZOVIRAX) 400 MG tablet TAKE ONE TABLET BY MOUTH TWICE DAILY  60 tablet  1  . ALPRAZolam (XANAX) 0.5 MG tablet TAKE ONE TABLET BY MOUTH TWICE DAILY AS NEEDED FOR ANXIETY  60 tablet  1  . Calcium Carbonate-Vitamin D 600-400 MG-UNIT per tablet Take 1 tablet by mouth 3 (three) times daily with meals.        Marland Kitchen dexamethasone (DECADRON) 4 MG tablet TAKE 10 TABLETS BY MOUTH ONCE PER WEEK  40 tablet  0  . dexamethasone (DECADRON) 4 MG tablet TAKE 10 TABLETS BY MOUTH ONCE PER WEEK  40 tablet  0  . gabapentin (NEURONTIN) 100 MG capsule TAKE ONE TO FOUR CAPSULES BY MOUTH THREE TIMES DAILY  360 capsule  3  . glipiZIDE (GLUCOTROL XL) 2.5 MG 24 hr tablet Take 1 tablet (2.5 mg total) by mouth daily.  90 tablet  3  . glucose blood  (ONE TOUCH ULTRA TEST) test strip Use as instructed  25 each  6  . ibuprofen (ADVIL,MOTRIN) 200 MG tablet Take 200 mg by mouth every 6 (six) hours as needed.        . Lancets (ONETOUCH ULTRASOFT) lancets Test blood sugar once daily or as directed.  100 each  11  . lisinopril-hydrochlorothiazide (PRINZIDE,ZESTORETIC) 20-12.5 MG per tablet TAKE ONE-HALF TABLET BY MOUTH EVERY DAY  45 tablet  1  . magnesium oxide (MAG-OX) 400 MG tablet Take 400 mg by mouth daily.       Marland Kitchen oxyCODONE (OXY IR/ROXICODONE) 5 MG immediate release tablet Take 1 1/2 tabs by mouth every 6 hours as needed for pain  90 tablet  0  . pomalidomide (POMALYST) 4 MG capsule Take 1 capsule (4 mg total) by mouth daily. Take with water on days 1-21. Repeat every 28 days.   Last survey   06/06/13.   Authorization  #   [id number]   08/19/13. Adult female not of reproductive potential.  21 capsule  0  . potassium chloride SA (K-DUR,KLOR-CON) 20 MEQ  tablet Take 1 tablet (20 mEq total) by mouth daily as needed.  10 tablet  0  . potassium chloride SA (K-DUR,KLOR-CON) 20 MEQ tablet Take 1 tablet (20 mEq total) by mouth 2 (two) times daily. For 10 days  20 tablet  0  . predniSONE (DELTASONE) 20 MG tablet Take 20 mg by mouth once a week.      . predniSONE (DELTASONE) 20 MG tablet TAKE ONE TABLET BY MOUTH ONCE A WEEK WITH FOOD ON  MONDAY  12 tablet  0  . prochlorperazine (COMPAZINE) 10 MG tablet Take 1 tablet (10 mg total) by mouth every 6 (six) hours as needed.  30 tablet  1  . temazepam (RESTORIL) 30 MG capsule TAKE ONE CAPSULE BY MOUTH AT BEDTIME prn  30 capsule  0  . warfarin (COUMADIN) 5 MG tablet Take one by mouth daily on Monday & Wednesday. Take 1/2 tablet on all other days or as directed  60 tablet  0  . OxyCODONE (OXYCONTIN) 10 mg T12A 12 hr tablet Take 1 tablet (10 mg total) by mouth every 12 (twelve) hours.  60 tablet  0   No current facility-administered medications for this encounter.   Facility-Administered Medications Ordered in Other  Encounters  Medication Dose Route Frequency Provider Last Rate Last Dose  . Zoledronic Acid (ZOMETA) 4 mg IVPB  4 mg Intravenous Once Curt Bears, MD       Labs:  Lab Results  Component Value Date   WBC 4.7 08/11/2013   HGB 10.6* 08/11/2013   HCT 32.1* 08/11/2013   MCV 97.3 08/11/2013   PLT 262 08/11/2013   Lab Results  Component Value Date   CREATININE 1.0 08/11/2013   BUN 13.4 08/11/2013   NA 142 08/11/2013   K 2.9* 08/11/2013   CL 107 02/14/2013   CO2 28 08/11/2013   Lab Results  Component Value Date   ALT 18 08/11/2013   AST 15 08/11/2013   BILITOT 0.37 08/11/2013    Physical Examination:  Filed Vitals:   09/06/13 1420  BP: 147/85  Pulse: 79  Temp: 99.6 F (37.6 C)  Resp: 20    Wt Readings from Last 3 Encounters:  09/06/13 131 lb 9.6 oz (59.693 kg)  08/11/13 134 lb 9.6 oz (61.054 kg)  07/21/13 134 lb 1.6 oz (60.827 kg)     Lungs - Normal respiratory effort, chest expands symmetrically. Lungs are clear to auscultation, no crackles or wheezes.  Heart has regular rhythm and rate  Abdomen is soft and non tender with normal bowel sounds  Assessment:  Patient tolerating treatments well  Plan: Continue treatment per original radiation prescription

## 2013-09-06 NOTE — Progress Notes (Signed)
Weekly rad txs lumbar/sacc spine, 5tx compeletd, in w/c, has had a cough since September  That hasn't gone away, takes robitussin and mucinex, coughs up yellow pheglm, appetite poor, ate a banana and had some pnut butter this am for breakfast with water, ate a few bites chicken for lunch, takes 4mg  decadron weekly on fridays, flat affect, radiaplex gel given on 08/31/13 using daily, c/o pain down b/l lhips legs

## 2013-09-06 NOTE — Telephone Encounter (Signed)
PT.IS HERE FOR RADIATION AND TO SEE DR.KINARD. SPOKE WITH DR.KINARD'S NURSE, KATHY. SHE WILL NOTIFY DR.KINARD ABOUT PT.'S COUGH.

## 2013-09-07 ENCOUNTER — Ambulatory Visit (HOSPITAL_COMMUNITY)
Admission: RE | Admit: 2013-09-07 | Discharge: 2013-09-07 | Disposition: A | Discharge status: Home / Self Care | Payer: Medicare Other | Source: Ambulatory Visit | Attending: Radiation Oncology | Admitting: Radiation Oncology

## 2013-09-07 ENCOUNTER — Ambulatory Visit
Admission: RE | Admit: 2013-09-07 | Discharge: 2013-09-07 | Disposition: A | Discharge status: Home / Self Care | Payer: Medicare Other | Source: Ambulatory Visit | Attending: Radiation Oncology | Admitting: Radiation Oncology

## 2013-09-07 DIAGNOSIS — R5383 Other fatigue: Secondary | ICD-10-CM | POA: Insufficient documentation

## 2013-09-07 DIAGNOSIS — R05 Cough: Secondary | ICD-10-CM

## 2013-09-07 DIAGNOSIS — R5381 Other malaise: Secondary | ICD-10-CM | POA: Insufficient documentation

## 2013-09-07 DIAGNOSIS — R918 Other nonspecific abnormal finding of lung field: Secondary | ICD-10-CM

## 2013-09-07 DIAGNOSIS — C9000 Multiple myeloma not having achieved remission: Secondary | ICD-10-CM

## 2013-09-07 DIAGNOSIS — R059 Cough, unspecified: Secondary | ICD-10-CM | POA: Insufficient documentation

## 2013-09-08 ENCOUNTER — Telehealth: Payer: Self-pay | Admitting: *Deleted

## 2013-09-08 ENCOUNTER — Emergency Department (HOSPITAL_COMMUNITY): Payer: Medicare Other

## 2013-09-08 ENCOUNTER — Encounter (HOSPITAL_COMMUNITY): Payer: Self-pay | Admitting: Emergency Medicine

## 2013-09-08 ENCOUNTER — Ambulatory Visit: Payer: Medicare Other

## 2013-09-08 ENCOUNTER — Inpatient Hospital Stay (HOSPITAL_COMMUNITY)
Admission: EM | Admit: 2013-09-08 | Discharge: 2013-09-12 | DRG: 689 | Disposition: A | Discharge status: Home / Self Care | Payer: Medicare Other | Attending: Internal Medicine | Admitting: Internal Medicine

## 2013-09-08 DIAGNOSIS — B9789 Other viral agents as the cause of diseases classified elsewhere: Secondary | ICD-10-CM | POA: Diagnosis present

## 2013-09-08 DIAGNOSIS — F329 Major depressive disorder, single episode, unspecified: Secondary | ICD-10-CM

## 2013-09-08 DIAGNOSIS — J069 Acute upper respiratory infection, unspecified: Secondary | ICD-10-CM | POA: Diagnosis present

## 2013-09-08 DIAGNOSIS — M25552 Pain in left hip: Secondary | ICD-10-CM

## 2013-09-08 DIAGNOSIS — Z7189 Other specified counseling: Secondary | ICD-10-CM

## 2013-09-08 DIAGNOSIS — E119 Type 2 diabetes mellitus without complications: Secondary | ICD-10-CM | POA: Diagnosis present

## 2013-09-08 DIAGNOSIS — I959 Hypotension, unspecified: Secondary | ICD-10-CM | POA: Diagnosis present

## 2013-09-08 DIAGNOSIS — M542 Cervicalgia: Secondary | ICD-10-CM

## 2013-09-08 DIAGNOSIS — IMO0002 Reserved for concepts with insufficient information to code with codable children: Secondary | ICD-10-CM

## 2013-09-08 DIAGNOSIS — Z8261 Family history of arthritis: Secondary | ICD-10-CM

## 2013-09-08 DIAGNOSIS — Z79899 Other long term (current) drug therapy: Secondary | ICD-10-CM

## 2013-09-08 DIAGNOSIS — Z86718 Personal history of other venous thrombosis and embolism: Secondary | ICD-10-CM

## 2013-09-08 DIAGNOSIS — B961 Klebsiella pneumoniae [K. pneumoniae] as the cause of diseases classified elsewhere: Secondary | ICD-10-CM | POA: Diagnosis present

## 2013-09-08 DIAGNOSIS — Z1211 Encounter for screening for malignant neoplasm of colon: Secondary | ICD-10-CM

## 2013-09-08 DIAGNOSIS — Z7901 Long term (current) use of anticoagulants: Secondary | ICD-10-CM

## 2013-09-08 DIAGNOSIS — M79609 Pain in unspecified limb: Secondary | ICD-10-CM | POA: Diagnosis present

## 2013-09-08 DIAGNOSIS — N390 Urinary tract infection, site not specified: Principal | ICD-10-CM | POA: Diagnosis present

## 2013-09-08 DIAGNOSIS — I82401 Acute embolism and thrombosis of unspecified deep veins of right lower extremity: Secondary | ICD-10-CM

## 2013-09-08 DIAGNOSIS — G929 Unspecified toxic encephalopathy: Secondary | ICD-10-CM | POA: Diagnosis present

## 2013-09-08 DIAGNOSIS — E785 Hyperlipidemia, unspecified: Secondary | ICD-10-CM | POA: Diagnosis present

## 2013-09-08 DIAGNOSIS — I639 Cerebral infarction, unspecified: Secondary | ICD-10-CM

## 2013-09-08 DIAGNOSIS — Z823 Family history of stroke: Secondary | ICD-10-CM

## 2013-09-08 DIAGNOSIS — Z8249 Family history of ischemic heart disease and other diseases of the circulatory system: Secondary | ICD-10-CM

## 2013-09-08 DIAGNOSIS — E860 Dehydration: Secondary | ICD-10-CM | POA: Diagnosis present

## 2013-09-08 DIAGNOSIS — R609 Edema, unspecified: Secondary | ICD-10-CM

## 2013-09-08 DIAGNOSIS — I82409 Acute embolism and thrombosis of unspecified deep veins of unspecified lower extremity: Secondary | ICD-10-CM | POA: Diagnosis present

## 2013-09-08 DIAGNOSIS — C9000 Multiple myeloma not having achieved remission: Secondary | ICD-10-CM

## 2013-09-08 DIAGNOSIS — G92 Toxic encephalopathy: Secondary | ICD-10-CM | POA: Diagnosis present

## 2013-09-08 DIAGNOSIS — R531 Weakness: Secondary | ICD-10-CM | POA: Diagnosis present

## 2013-09-08 DIAGNOSIS — I82402 Acute embolism and thrombosis of unspecified deep veins of left lower extremity: Secondary | ICD-10-CM

## 2013-09-08 DIAGNOSIS — N179 Acute kidney failure, unspecified: Secondary | ICD-10-CM | POA: Diagnosis present

## 2013-09-08 DIAGNOSIS — T451X5A Adverse effect of antineoplastic and immunosuppressive drugs, initial encounter: Secondary | ICD-10-CM | POA: Diagnosis present

## 2013-09-08 DIAGNOSIS — Z833 Family history of diabetes mellitus: Secondary | ICD-10-CM

## 2013-09-08 DIAGNOSIS — Z66 Do not resuscitate: Secondary | ICD-10-CM | POA: Diagnosis present

## 2013-09-08 DIAGNOSIS — B029 Zoster without complications: Secondary | ICD-10-CM

## 2013-09-08 DIAGNOSIS — R4182 Altered mental status, unspecified: Secondary | ICD-10-CM

## 2013-09-08 DIAGNOSIS — M549 Dorsalgia, unspecified: Secondary | ICD-10-CM

## 2013-09-08 DIAGNOSIS — G8929 Other chronic pain: Secondary | ICD-10-CM | POA: Diagnosis present

## 2013-09-08 DIAGNOSIS — F3289 Other specified depressive episodes: Secondary | ICD-10-CM

## 2013-09-08 DIAGNOSIS — M1710 Unilateral primary osteoarthritis, unspecified knee: Secondary | ICD-10-CM | POA: Diagnosis present

## 2013-09-08 DIAGNOSIS — I10 Essential (primary) hypertension: Secondary | ICD-10-CM | POA: Diagnosis present

## 2013-09-08 DIAGNOSIS — R197 Diarrhea, unspecified: Secondary | ICD-10-CM

## 2013-09-08 DIAGNOSIS — C9002 Multiple myeloma in relapse: Secondary | ICD-10-CM | POA: Diagnosis present

## 2013-09-08 DIAGNOSIS — Z800 Family history of malignant neoplasm of digestive organs: Secondary | ICD-10-CM

## 2013-09-08 DIAGNOSIS — Z8042 Family history of malignant neoplasm of prostate: Secondary | ICD-10-CM

## 2013-09-08 DIAGNOSIS — R2681 Unsteadiness on feet: Secondary | ICD-10-CM | POA: Diagnosis present

## 2013-09-08 DIAGNOSIS — I809 Phlebitis and thrombophlebitis of unspecified site: Secondary | ICD-10-CM

## 2013-09-08 DIAGNOSIS — M1990 Unspecified osteoarthritis, unspecified site: Secondary | ICD-10-CM

## 2013-09-08 DIAGNOSIS — W19XXXA Unspecified fall, initial encounter: Secondary | ICD-10-CM | POA: Diagnosis present

## 2013-09-08 HISTORY — DX: Family history of other specified conditions: Z84.89

## 2013-09-08 HISTORY — DX: Urinary tract infection, site not specified: N39.0

## 2013-09-08 HISTORY — DX: Cardiomyopathy, unspecified: I42.9

## 2013-09-08 LAB — GLUCOSE, CAPILLARY
GLUCOSE-CAPILLARY: 106 mg/dL — AB (ref 70–99)
Glucose-Capillary: 165 mg/dL — ABNORMAL HIGH (ref 70–99)

## 2013-09-08 LAB — INFLUENZA PANEL BY PCR (TYPE A & B)
H1N1 flu by pcr: NOT DETECTED
INFLBPCR: NEGATIVE
Influenza A By PCR: NEGATIVE

## 2013-09-08 LAB — COMPREHENSIVE METABOLIC PANEL
ALK PHOS: 44 U/L (ref 39–117)
ALT: 15 U/L (ref 0–35)
AST: 21 U/L (ref 0–37)
Albumin: 3 g/dL — ABNORMAL LOW (ref 3.5–5.2)
BILIRUBIN TOTAL: 0.4 mg/dL (ref 0.3–1.2)
BUN: 26 mg/dL — AB (ref 6–23)
CO2: 26 meq/L (ref 19–32)
CREATININE: 1.49 mg/dL — AB (ref 0.50–1.10)
Calcium: 9.3 mg/dL (ref 8.4–10.5)
Chloride: 98 mEq/L (ref 96–112)
GFR calc Af Amer: 38 mL/min — ABNORMAL LOW (ref >90)
GFR, EST NON AFRICAN AMERICAN: 33 mL/min — AB (ref >90)
GLUCOSE: 122 mg/dL — AB (ref 70–99)
POTASSIUM: 4.1 meq/L (ref 3.7–5.3)
Sodium: 139 mEq/L (ref 137–147)
Total Protein: 6.3 g/dL (ref 6.0–8.3)

## 2013-09-08 LAB — URINALYSIS, ROUTINE W REFLEX MICROSCOPIC
Bilirubin Urine: NEGATIVE
Glucose, UA: NEGATIVE mg/dL
KETONES UR: NEGATIVE mg/dL
NITRITE: POSITIVE — AB
PROTEIN: 30 mg/dL — AB
Specific Gravity, Urine: 1.019 (ref 1.005–1.030)
Urobilinogen, UA: 0.2 mg/dL (ref 0.0–1.0)
pH: 6 (ref 5.0–8.0)

## 2013-09-08 LAB — RAPID URINE DRUG SCREEN, HOSP PERFORMED
Amphetamines: NOT DETECTED
BARBITURATES: NOT DETECTED
Benzodiazepines: POSITIVE — AB
Cocaine: NOT DETECTED
OPIATES: NOT DETECTED
TETRAHYDROCANNABINOL: NOT DETECTED

## 2013-09-08 LAB — ACETAMINOPHEN LEVEL

## 2013-09-08 LAB — CBC WITH DIFFERENTIAL/PLATELET
Basophils Absolute: 0 10*3/uL (ref 0.0–0.1)
Basophils Relative: 0 % (ref 0–1)
Eosinophils Absolute: 0.4 10*3/uL (ref 0.0–0.7)
Eosinophils Relative: 4 % (ref 0–5)
HEMATOCRIT: 32.7 % — AB (ref 36.0–46.0)
HEMOGLOBIN: 10.6 g/dL — AB (ref 12.0–15.0)
LYMPHS ABS: 0.4 10*3/uL — AB (ref 0.7–4.0)
Lymphocytes Relative: 5 % — ABNORMAL LOW (ref 12–46)
MCH: 32.1 pg (ref 26.0–34.0)
MCHC: 32.4 g/dL (ref 30.0–36.0)
MCV: 99.1 fL (ref 78.0–100.0)
MONOS PCT: 5 % (ref 3–12)
Monocytes Absolute: 0.4 10*3/uL (ref 0.1–1.0)
NEUTROS ABS: 7.2 10*3/uL (ref 1.7–7.7)
NEUTROS PCT: 86 % — AB (ref 43–77)
Platelets: 236 10*3/uL (ref 150–400)
RBC: 3.3 MIL/uL — ABNORMAL LOW (ref 3.87–5.11)
RDW: 15.7 % — ABNORMAL HIGH (ref 11.5–15.5)
WBC: 8.5 10*3/uL (ref 4.0–10.5)

## 2013-09-08 LAB — TROPONIN I: Troponin I: <0.3 ng/mL (ref <0.30)

## 2013-09-08 LAB — PROTIME-INR
INR: 2.76 — ABNORMAL HIGH (ref 0.00–1.49)
Prothrombin Time: 28.2 seconds — ABNORMAL HIGH (ref 11.6–15.2)

## 2013-09-08 LAB — URINE MICROSCOPIC-ADD ON

## 2013-09-08 LAB — SALICYLATE LEVEL: Salicylate Lvl: <2 mg/dL — ABNORMAL LOW (ref 2.8–20.0)

## 2013-09-08 LAB — CG4 I-STAT (LACTIC ACID)
Lactic Acid, Venous: 1.3 mmol/L (ref 0.5–2.2)
Lactic Acid, Venous: 0.75 mmol/L (ref 0.5–2.2)

## 2013-09-08 LAB — ETHANOL: Alcohol, Ethyl (B): <11 mg/dL (ref 0–11)

## 2013-09-08 MED ORDER — GLIPIZIDE ER 2.5 MG PO TB24: 2.5000 mg | ORAL_TABLET | Freq: Every day | ORAL | Status: DC

## 2013-09-08 MED ORDER — GUAIFENESIN ER 600 MG PO TB12
600.0000 mg | ORAL_TABLET | Freq: Two times a day (BID) | ORAL | Status: DC
  Administered 2013-09-08 – 2013-09-12 (×8): 600 mg via ORAL
  Filled 2013-09-08 (×9): qty 1

## 2013-09-08 MED ORDER — WARFARIN SODIUM 2.5 MG PO TABS
2.5000 mg | ORAL_TABLET | Freq: Once | ORAL | Status: AC
  Administered 2013-09-08: 19:00:00 2.5 mg via ORAL
  Filled 2013-09-08 (×2): qty 1

## 2013-09-08 MED ORDER — HYDROCORTISONE SOD SUCCINATE 100 MG IJ SOLR
50.0000 mg | Freq: Three times a day (TID) | INTRAMUSCULAR | Status: AC
  Administered 2013-09-08 – 2013-09-09 (×3): 50 mg via INTRAVENOUS
  Filled 2013-09-08 (×4): qty 1

## 2013-09-08 MED ORDER — ONDANSETRON HCL 4 MG PO TABS: 4.0000 mg | ORAL_TABLET | Freq: Four times a day (QID) | ORAL | Status: DC | PRN

## 2013-09-08 MED ORDER — PREDNISONE 20 MG PO TABS
20.0000 mg | ORAL_TABLET | ORAL | Status: DC
  Administered 2013-09-11: 23:00:00 20 mg via ORAL
  Filled 2013-09-08: qty 1

## 2013-09-08 MED ORDER — WARFARIN - PHARMACIST DOSING INPATIENT: Freq: Every day | Status: DC

## 2013-09-08 MED ORDER — OXYCODONE HCL 5 MG PO TABS
5.0000 mg | ORAL_TABLET | Freq: Four times a day (QID) | ORAL | Status: DC | PRN
  Administered 2013-09-08 (×2): 5 mg via ORAL
  Filled 2013-09-08 (×3): qty 1

## 2013-09-08 MED ORDER — GABAPENTIN 100 MG PO CAPS
100.0000 mg | ORAL_CAPSULE | Freq: Every day | ORAL | Status: DC
  Administered 2013-09-08 – 2013-09-12 (×5): 100 mg via ORAL
  Filled 2013-09-08 (×5): qty 1

## 2013-09-08 MED ORDER — ALBUTEROL SULFATE (2.5 MG/3ML) 0.083% IN NEBU
2.5000 mg | INHALATION_SOLUTION | Freq: Four times a day (QID) | RESPIRATORY_TRACT | Status: DC
  Administered 2013-09-08 – 2013-09-09 (×2): 2.5 mg via RESPIRATORY_TRACT
  Filled 2013-09-08 (×2): qty 3

## 2013-09-08 MED ORDER — ACYCLOVIR 400 MG PO TABS
400.0000 mg | ORAL_TABLET | Freq: Two times a day (BID) | ORAL | Status: DC
  Administered 2013-09-08 – 2013-09-11 (×6): 400 mg via ORAL
  Filled 2013-09-08 (×8): qty 1

## 2013-09-08 MED ORDER — INSULIN ASPART 100 UNIT/ML ~~LOC~~ SOLN
0.0000 [IU] | Freq: Three times a day (TID) | SUBCUTANEOUS | Status: DC
  Administered 2013-09-09 – 2013-09-10 (×5): 2 [IU] via SUBCUTANEOUS
  Administered 2013-09-11 – 2013-09-12 (×3): 1 [IU] via SUBCUTANEOUS

## 2013-09-08 MED ORDER — POTASSIUM CHLORIDE CRYS ER 20 MEQ PO TBCR
20.0000 meq | EXTENDED_RELEASE_TABLET | Freq: Two times a day (BID) | ORAL | Status: DC
  Administered 2013-09-08 – 2013-09-12 (×8): 20 meq via ORAL
  Filled 2013-09-08 (×12): qty 1

## 2013-09-08 MED ORDER — ONDANSETRON HCL 4 MG/2ML IJ SOLN
4.0000 mg | Freq: Four times a day (QID) | INTRAMUSCULAR | Status: DC | PRN
  Administered 2013-09-11: 08:00:00 4 mg via INTRAVENOUS
  Filled 2013-09-08: qty 2

## 2013-09-08 MED ORDER — OSELTAMIVIR PHOSPHATE 30 MG PO CAPS
30.0000 mg | ORAL_CAPSULE | Freq: Every day | ORAL | Status: DC
  Administered 2013-09-08: 30 mg via ORAL
  Filled 2013-09-08: qty 1

## 2013-09-08 MED ORDER — DEXTROSE-NACL 5-0.9 % IV SOLN
INTRAVENOUS | Status: DC
  Administered 2013-09-08: 100 mL/h via INTRAVENOUS
  Administered 2013-09-09 (×2): via INTRAVENOUS

## 2013-09-08 MED ORDER — DEXTROSE 5 % IV SOLN
1.0000 g | INTRAVENOUS | Status: DC
  Administered 2013-09-08 – 2013-09-11 (×4): 1 g via INTRAVENOUS
  Filled 2013-09-08 (×5): qty 10

## 2013-09-08 MED ORDER — DEXTROSE 5 % IV SOLN
1.0000 g | Freq: Once | INTRAVENOUS | Status: AC
  Administered 2013-09-08: 1 g via INTRAVENOUS
  Filled 2013-09-08: qty 10

## 2013-09-08 MED ORDER — OSELTAMIVIR PHOSPHATE 75 MG PO CAPS
75.0000 mg | ORAL_CAPSULE | Freq: Two times a day (BID) | ORAL | Status: DC
  Filled 2013-09-08: qty 1

## 2013-09-08 MED ORDER — HEPARIN SODIUM (PORCINE) 5000 UNIT/ML IJ SOLN: 5000.0000 [IU] | Freq: Three times a day (TID) | INTRAMUSCULAR | Status: DC

## 2013-09-08 MED ORDER — OXYCODONE HCL ER 10 MG PO T12A
10.0000 mg | EXTENDED_RELEASE_TABLET | Freq: Two times a day (BID) | ORAL | Status: DC
  Administered 2013-09-08 – 2013-09-12 (×8): 10 mg via ORAL
  Filled 2013-09-08 (×8): qty 1

## 2013-09-08 MED ORDER — SODIUM CHLORIDE 0.9 % IV SOLN: INTRAVENOUS | Status: DC

## 2013-09-08 MED ORDER — DEXAMETHASONE 6 MG PO TABS
40.0000 mg | ORAL_TABLET | ORAL | Status: DC
  Administered 2013-09-09: 10:00:00 40 mg via ORAL
  Filled 2013-09-08: qty 1

## 2013-09-08 MED ORDER — CALCIUM CARBONATE-VITAMIN D 600-400 MG-UNIT PO TABS: 1.0000 | ORAL_TABLET | Freq: Three times a day (TID) | ORAL | Status: DC

## 2013-09-08 MED ORDER — ALPRAZOLAM 0.5 MG PO TABS
0.5000 mg | ORAL_TABLET | Freq: Two times a day (BID) | ORAL | Status: DC | PRN
  Administered 2013-09-08 – 2013-09-12 (×4): 0.5 mg via ORAL
  Filled 2013-09-08: qty 1
  Filled 2013-09-08: qty 2
  Filled 2013-09-08 (×2): qty 1

## 2013-09-08 NOTE — ED Notes (Signed)
Pt up to bedside commode. Family at bedside.

## 2013-09-08 NOTE — ED Provider Notes (Signed)
CSN: [id number]     Arrival date & time 09/08/13  0038 History   First MD Initiated Contact with Patient 09/08/13 0054     Chief Complaint  Patient presents with  . Fall   (Consider location/radiation/quality/duration/timing/severity/associated sxs/prior Treatment) HPI Comments: Patient presents from home with altered mental status. EMS was called after she had an unwitnessed fall around 11:30 PM. She is slurring her words but seems to be oriented x2. She follows some commands. She has some weakness in her lower extremities. She has a history of multiple myeloma, diabetes, hypertension. She has been getting radiation this week. She denies any type of pain. Her family did not see her fall. Her last seen normal at approximately 5 PM.  The history is provided by the patient and the EMS personnel. The history is limited by the condition of the patient.    Past Medical History  Diagnosis Date  . Blood transfusion   . Depression     Mild  . History of chicken pox   . Hyperlipidemia   . Hypertension   . Degenerative joint disease   . Diabetes mellitus     Steroid related  . Phlebitis   . Multiple myeloma     per Dr. Julien Nordmann, s/p palliative radiation for leg pain 2011 per Dr. Sondra Come  . Deep vein thrombosis of bilateral lower extremities    Past Surgical History  Procedure Laterality Date  . Abdominal hysterectomy     Family History  Problem Relation Age of Onset  . Diabetes Mother   . Hypertension Mother   . Arthritis Mother   . Stroke Mother   . Diabetes Father   . Hypertension Father   . Arthritis Father   . Stroke Father   . Cancer Sister     stomach  . Stroke Brother   . Cancer Brother     prostate <50  . Stroke Daughter   . Stroke Son   . Arthritis Other     Parents  . Cancer Other     Colon, 1st degree relative <60  . Diabetes Other     Parents  . Stroke Other     1st degree relative <60   History  Substance Use Topics  . Smoking status: Never Smoker   .  Smokeless tobacco: Not on file  . Alcohol Use: No   OB History   Grav Para Term Preterm Abortions TAB SAB Ect Mult Living                 Review of Systems  Unable to perform ROS: Mental status change    Allergies  Review of patient's allergies indicates no known allergies.  Home Medications   No current outpatient prescriptions on file. BP 121/63  Pulse 74  Temp(Src) 97.9 F (36.6 C) (Oral)  Resp 18  Ht $R'4\' 10"'qO$  (1.473 m)  Wt 131 lb (59.421 kg)  BMI 27.39 kg/m2  SpO2 100% Physical Exam  Constitutional: She appears well-nourished. No distress.  HENT:  Head: Normocephalic.  Mouth/Throat: Oropharynx is clear and moist. No oropharyngeal exudate.  Eyes: Conjunctivae and EOM are normal. Pupils are equal, round, and reactive to light.  Neck: Normal range of motion. Neck supple.  No meningismus  Cardiovascular: Normal rate, regular rhythm and normal heart sounds.   No murmur heard. Pulmonary/Chest: Breath sounds normal.  Abdominal: Soft. There is no tenderness. There is no rebound and no guarding.  Musculoskeletal: Normal range of motion. She exhibits no edema and no  tenderness.  Neurological: She is alert. No cranial nerve deficit. Coordination abnormal.  Slurred speech, cranial nerves 2-12 intact. Equal grip strength bilaterally.some difficulty holding RLE off bed.  Able to hold UE out.  Skin: Skin is warm.    ED Course  Procedures (including critical care time) Labs Review Labs Reviewed  CBC WITH DIFFERENTIAL - Abnormal; Notable for the following:    RBC 3.30 (*)    Hemoglobin 10.6 (*)    HCT 32.7 (*)    RDW 15.7 (*)    Neutrophils Relative % 86 (*)    Lymphocytes Relative 5 (*)    Lymphs Abs 0.4 (*)    All other components within normal limits  COMPREHENSIVE METABOLIC PANEL - Abnormal; Notable for the following:    Glucose, Bld 122 (*)    BUN 26 (*)    Creatinine, Ser 1.49 (*)    Albumin 3.0 (*)    GFR calc non Af Amer 33 (*)    GFR calc Af Amer 38 (*)     All other components within normal limits  URINALYSIS, ROUTINE W REFLEX MICROSCOPIC - Abnormal; Notable for the following:    APPearance CLOUDY (*)    Hgb urine dipstick SMALL (*)    Protein, ur 30 (*)    Nitrite POSITIVE (*)    Leukocytes, UA SMALL (*)    All other components within normal limits  PROTIME-INR - Abnormal; Notable for the following:    Prothrombin Time 28.2 (*)    INR 2.76 (*)    All other components within normal limits  URINE RAPID DRUG SCREEN (HOSP PERFORMED) - Abnormal; Notable for the following:    Benzodiazepines POSITIVE (*)    All other components within normal limits  SALICYLATE LEVEL - Abnormal; Notable for the following:    Salicylate Lvl <4.4 (*)    All other components within normal limits  URINE MICROSCOPIC-ADD ON - Abnormal; Notable for the following:    Squamous Epithelial / LPF FEW (*)    Bacteria, UA MANY (*)    Casts HYALINE CASTS (*)    All other components within normal limits  URINE CULTURE  TROPONIN I  ETHANOL  ACETAMINOPHEN LEVEL  INFLUENZA PANEL BY PCR (TYPE A & B, H1N1)  INFLUENZA PANEL BY PCR (TYPE A & B, H1N1)  CG4 I-STAT (LACTIC ACID)  CG4 I-STAT (LACTIC ACID)   Imaging Review Dg Chest 2 View  09/08/2013   CLINICAL DATA:  Altered mental status  EXAM: CHEST  2 VIEW  COMPARISON:  Prior radiograph from 09/07/2013  FINDINGS: Cardiac and mediastinal silhouettes are unchanged, and remain within normal limits.  Lungs are mildly hypoinflated. Diffuse interstitial thickening with pulmonary vascular congestion is again seen, suggestive of mild pulmonary interstitial edema. Minimal blunting of the left costophrenic angle is suggestive of a small left pleural effusion. No definite focal infiltrate identified. No pneumothorax.  No acute osseous abnormality.  IMPRESSION: Persistent diffuse interstitial prominence, suggestive of mild pulmonary interstitial edema. Findings are not significantly changed relative to prior study.  Probable small left pleural  effusion.   Electronically Signed   By: Jeannine Boga M.D.   On: 09/08/2013 01:35   Dg Chest 2 View  09/07/2013   CLINICAL DATA:  Cough and weakness.  EXAM: CHEST  2 VIEW  COMPARISON:  August 31, 2012 PA and lateral chest  FINDINGS: The lungs remain hypoinflated. The interstitial markings are slightly more conspicuous bilaterally today than on the previous study. The hilar structures are less distinct. The  cardiac silhouette is top-normal in size. No definite pleural effusion is demonstrated. There is no evidence of a pneumothorax. Again demonstrated are wedge compressions of upper and mid thoracic vertebral bodies which do not appear significantly changed.  IMPRESSION: Mildly increased interstitial markings bilaterally may reflect interstitial edema of cardiac or noncardiac cause. There is no discrete alveolar pneumonia.   Electronically Signed   By: David  Martinique   On: 09/07/2013 15:45   Ct Head Wo Contrast  09/08/2013   CLINICAL DATA:  Fall.  EXAM: CT HEAD WITHOUT CONTRAST  TECHNIQUE: Contiguous axial images were obtained from the base of the skull through the vertex without intravenous contrast.  COMPARISON:  05/16/2011  FINDINGS: Skull and Sinuses:Stable appearance of multiple small lucent areas within the calvarium. The partly imaged maxillary sinuses have bilateral effusions.  Orbits: No acute abnormality.  Brain: No evidence of acute abnormality, such as acute infarction, hemorrhage, hydrocephalus, or mass lesion/mass effect. Generalized atrophy, stable from 2012.  IMPRESSION: No evidence of acute intracranial injury.  Maxillary sinus effusions.   Electronically Signed   By: Jorje Guild M.D.   On: 09/08/2013 02:43    EKG Interpretation    Date/Time:  Thursday September 08 2013 00:50:30 EST Ventricular Rate:  95 PR Interval:  121 QRS Duration: 82 QT Interval:  314 QTC Calculation: 395 R Axis:   13 Text Interpretation:  Sinus rhythm Biatrial enlargement Borderline T wave  abnormalities Baseline wander in lead(s) V2 Nonspecific ST abnormality Confirmed by Wyvonnia Dusky  MD, Meya Clutter ([id number]) on 09/08/2013 1:52:28 AM            MDM   1. Altered mental status   2. Urinary tract infection   3. CVA (cerebral infarction)   4. Chronic anticoagulation   5. Chronic steroid use   6. DNR no code (do not resuscitate)    Slurred speech with questionable right-sided weakness and mental status change. Last seen normal at 5 PM. Not tPA candidate 2/2 delayed presentation.  CT head negative  UA positive for infection.  Rocephin started. Patient remains altered and not as her baseline.  Speech remains slurred.   Admission for UTI and AMS d/w Dr. Marin Comment.  Will need MRI to r/o CVA.  BP 121/63  Pulse 74  Temp(Src) 97.9 F (36.6 C) (Oral)  Resp 18  Ht $R'4\' 10"'Zt$  (1.473 m)  Wt 131 lb (59.421 kg)  BMI 27.39 kg/m2  SpO2 100%    Ezequiel Essex, MD 09/08/13 1553

## 2013-09-08 NOTE — Clinical Social Work Psychosocial (Signed)
Clinical Social Work Department BRIEF PSYCHOSOCIAL ASSESSMENT 09/08/2013  Patient:  [name], [name]     Account Number:  [id number]     Admit date:  09/08/2013  Clinical Social Worker:  Jennette Dubin  Date/Time:  09/08/2013 08:00 AM  Referred by:  Physician  Date Referred:  09/08/2013 Referred for  Other - See comment   Other Referral:   History of falls   Interview type:  Pt and family Other interview type:    PSYCHOSOCIAL DATA Living Status:  FAMILY Admitted from facility:   Level of care:   Primary support name:  Eliott Nine Primary support relationship to patient:  CHILD, ADULT Degree of support available:   Good support    CURRENT CONCERNS Current Concerns  Other - See comment   Other Concerns:   History of falls    SOCIAL WORK ASSESSMENT / PLAN CSW at bedside with pt and daughterMaudie Mercury) present. Pt. is alert and oriented and able to answer questions by CSW. Pt lives with her daughter and her son who are her primary caregivers. Pt.'s daughter reports within the last 6 months pt has had at least 6 falls. No breaks as a result of falls. Pt has DME (wheelchair, bedside comode, walker, and shower chair) in the home. Pt and family feel they are maintaining her care. Pt has a PCP, Dr. Brayton El keeps appointments as scheduled. Pt has been diagnosed with Multliple Myeloma undergoing radiation and feels her treatment is going well. Pt will be admitted to hospital.   Assessment/plan status:  Psychosocial Support/Ongoing Assessment of Needs Other assessment/ plan:   Information/referral to community resources:   CSW will continue to assess for [employer]    PATIENT'S/FAMILY'S RESPONSE TO PLAN OF CARE: Pt and family appreciated visit from [place]. Role understood. CSW will continue to provide support and assist as needed.

## 2013-09-08 NOTE — ED Notes (Signed)
Ordered Lunch tray

## 2013-09-08 NOTE — Telephone Encounter (Signed)
Pt is admitted to [facility] for PNA and UTI.  Informed Dr Vista Mink.  SLJ

## 2013-09-08 NOTE — ED Notes (Signed)
Patient transported to X-ray 

## 2013-09-08 NOTE — H&P (Signed)
[facility] History and Physical  [name] MCN:[id number] DOB: 1936/01/06    PCP:   Lindsay Stain, MD   Chief Complaint: weakness, lethargic, and falling tonight.  HPI: [name] is an 78 y.o. female with hx of multiple myeloma, undergoing chemothx and radiation therapy, hx of bilateral and recurrent DVT on chronic coumadin, hx of DM, chronic steroid use (Pred $RemoveB'20mg'hwYBOeWh$  once per week, Decadron $RemoveBefore'10mg'VfEktWZnlbrPC$  per week), hx of HTN, Hyperlipidemia, severe right OA with pain and weakness on her right knee, fell tonight (mechanical fall) with no LOC, presents to the ER as she has been feeling weak and family said she has not been herself.  She has a mild nonproductive cough, but no fever or chills.  She had no shortness of breath or chest pain.  She hadn't been eating as much.  Evaluation in the ER included a CXR which was negative, CT of head showing no acute process, a normal WBC and normal renal fx tests.  She was found to have a UTI, and was a bit hypotensive in the ER as well.  Hospitalist was asked to admit her for weakness, UTI, volume depletion and altered mental status.  Rewiew of Systems:  Constitutional: Negative for malaise, fever and chills. No significant weight loss or weight gain Eyes: Negative for eye pain, redness and discharge, diplopia, visual changes, or flashes of light. ENMT: Negative for ear pain, hoarseness, nasal congestion, sinus pressure and sore throat. No headaches; tinnitus, drooling, or problem swallowing. Cardiovascular: Negative for chest pain, palpitations, diaphoresis, dyspnea and peripheral edema. ; No orthopnea, PND Respiratory: Negative for hemoptysis, wheezing and stridor. No pleuritic chestpain. Gastrointestinal: Negative for nausea, vomiting, diarrhea, constipation, abdominal pain, melena, blood in stool, hematemesis, jaundice and rectal bleeding.    Genitourinary: Negative for frequency, dysuria, incontinence,flank pain and hematuria; Musculoskeletal: Negative  for back pain and neck pain. Negative for swelling and trauma.;  Skin: . Negative for pruritus, rash, abrasions, bruising and skin lesion.; ulcerations Neuro: Negative for headache, lightheadedness and neck stiffness. Negative for weakness, altered level of consciousness , burning feet, involuntary movement, seizure and syncope.  Psych: negative for anxiety, depression, insomnia, tearfulness, panic attacks, hallucinations, paranoia, suicidal or homicidal ideation   Past Medical History  Diagnosis Date  . Blood transfusion   . Depression     Mild  . History of chicken pox   . Hyperlipidemia   . Hypertension   . Degenerative joint disease   . Diabetes mellitus     Steroid related  . Phlebitis   . Multiple myeloma     per Dr. Julien Nordmann, s/p palliative radiation for leg pain 2011 per Dr. Sondra Come  . Deep vein thrombosis of bilateral lower extremities     Past Surgical History  Procedure Laterality Date  . Abdominal hysterectomy      Medications:  HOME MEDS: Prior to Admission medications   Medication Sig Start Date End Date Taking? Authorizing Provider  acyclovir (ZOVIRAX) 400 MG tablet Take 400 mg by mouth 2 (two) times daily.   Yes Historical Provider, MD  ALPRAZolam Duanne Moron) 0.5 MG tablet Take 0.5 mg by mouth 2 (two) times daily as needed for anxiety.   Yes Historical Provider, MD  Calcium Carbonate-Vitamin D 600-400 MG-UNIT per tablet Take 1 tablet by mouth 3 (three) times daily with meals.     Yes Historical Provider, MD  dexamethasone (DECADRON) 4 MG tablet Take 40 mg by mouth once a week.   Yes Historical Provider, MD  gabapentin (NEURONTIN) 100 MG capsule  Take 100-400 mg by mouth daily.   Yes Historical Provider, MD  glipiZIDE (GLUCOTROL XL) 2.5 MG 24 hr tablet Take 1 tablet (2.5 mg total) by mouth daily. 02/14/13  Yes Tonia Ghent, MD  glucose blood (ONE TOUCH ULTRA TEST) test strip Use as instructed 12/23/11  Yes Tonia Ghent, MD  ibuprofen (ADVIL,MOTRIN) 200 MG tablet  Take 200 mg by mouth every 6 (six) hours as needed.     Yes Historical Provider, MD  Lancets [facility] ULTRASOFT) lancets Test blood sugar once daily or as directed. 06/30/11  Yes Tonia Ghent, MD  lisinopril-hydrochlorothiazide (PRINZIDE,ZESTORETIC) 20-12.5 MG per tablet TAKE ONE-HALF TABLET BY MOUTH EVERY DAY 08/22/13  Yes Tonia Ghent, MD  magnesium oxide (MAG-OX) 400 MG tablet Take 400 mg by mouth daily.    Yes Historical Provider, MD  oxyCODONE (OXY IR/ROXICODONE) 5 MG immediate release tablet 2.5-5 mg every 6 (six) hours as needed for severe pain or breakthrough pain. Take 1 1/2 tabs by mouth every 6 hours as needed for pain 08/30/13  Yes Curt Bears, MD  OxyCODONE (OXYCONTIN) 10 mg T12A 12 hr tablet Take 1 tablet (10 mg total) by mouth every 12 (twelve) hours. 09/06/13  Yes Curt Bears, MD  pomalidomide (POMALYST) 4 MG capsule Take 4 mg by mouth daily. Take with water on days 1-21. Repeat every 28 days.   Last survey   06/06/13.   Authorization  #   [id number]   08/19/13. Adult female not of reproductive potential. 08/19/13  Yes Curt Bears, MD  potassium chloride SA (K-DUR,KLOR-CON) 20 MEQ tablet Take 1 tablet (20 mEq total) by mouth daily as needed. 08/11/13  Yes Curt Bears, MD  predniSONE (DELTASONE) 20 MG tablet Take 20 mg by mouth once a week.   Yes Historical Provider, MD  prochlorperazine (COMPAZINE) 10 MG tablet Take 1 tablet (10 mg total) by mouth every 6 (six) hours as needed. 12/30/12  Yes Curt Bears, MD  temazepam (RESTORIL) 30 MG capsule Take 30 mg by mouth at bedtime as needed. 05/26/13  Yes Curt Bears, MD  warfarin (COUMADIN) 5 MG tablet Take 2.5-5 mg by mouth daily at 6 PM. Take one by mouth daily on Monday & Wednesday. Take 1/2 tablet on all other days or as directed 08/08/13  Yes Curt Bears, MD     Allergies:  No Known Allergies  Social History:   reports that she has never smoked. She does not have any smokeless tobacco history on file. She  reports that she does not drink alcohol. Her drug history is not on file.  Family History: Family History  Problem Relation Age of Onset  . Diabetes Mother   . Hypertension Mother   . Arthritis Mother   . Stroke Mother   . Diabetes Father   . Hypertension Father   . Arthritis Father   . Stroke Father   . Cancer Sister     stomach  . Stroke Brother   . Cancer Brother     prostate <50  . Stroke Daughter   . Stroke Son   . Arthritis Other     Parents  . Cancer Other     Colon, 1st degree relative <60  . Diabetes Other     Parents  . Stroke Other     1st degree relative <60     Physical Exam: Filed Vitals:   09/08/13 0050 09/08/13 0145 09/08/13 0215  BP: 110/95 109/59 99/51  Pulse: 94 91 87  Temp: 97.9 F (  36.6 C)    TempSrc: Oral    Resp: $Remo'18 24 28  'IbkXX$ Height: $Rem'4\' 10"'plrG$  (1.473 m)    Weight: 59.421 kg (131 lb)    SpO2: 98% 100% 100%   Blood pressure 99/51, pulse 87, temperature 97.9 F (36.6 C), temperature source Oral, resp. rate 28, height $RemoveBe'4\' 10"'wnjpqUZRa$  (1.473 m), weight 59.421 kg (131 lb), SpO2 100.00%.  GEN:  Pleasant  patient lying in the stretcher in no acute distress; cooperative with exam. PSYCH:  alert and oriented x4; does not appear anxious or depressed; affect is appropriate. HEENT: Mucous membranes pink and anicteric; PERRLA; EOM intact; no cervical lymphadenopathy nor thyromegaly or carotid bruit; no JVD; There were no stridor. Neck is very supple. Breasts:: Not examined CHEST WALL: No tenderness CHEST: Normal respiration, clear to auscultation bilaterally.  HEART: Regular rate and rhythm.  There are no murmur, rub, or gallops.   BACK: No kyphosis or scoliosis; no CVA tenderness ABDOMEN: soft and non-tender; no masses, no organomegaly, normal abdominal bowel sounds; no pannus; no intertriginous candida. There is no rebound and no distention. Rectal Exam: Not done EXTREMITIES: No bone or joint deformity; age-appropriate arthropathy of the hands and knees; no edema;  no ulcerations.  There is no calf tenderness. Genitalia: not examined PULSES: 2+ and symmetric SKIN: Normal hydration no rash or ulceration CNS: Cranial nerves 2-12 grossly intact no focal lateralizing neurologic deficit.  Speech is fluent; uvula elevated with phonation, facial symmetry and tongue midline. DTR are normal bilaterally, cerebella exam is intact, barbinski is negative and strengths are equaled bilaterally.  No sensory loss.   Labs on Admission:  Basic Metabolic Panel:  Recent Labs Lab 09/08/13 0105  NA 139  K 4.1  CL 98  CO2 26  GLUCOSE 122*  BUN 26*  CREATININE 1.49*  CALCIUM 9.3   Liver Function Tests:  Recent Labs Lab 09/08/13 0105  AST 21  ALT 15  ALKPHOS 44  BILITOT 0.4  PROT 6.3  ALBUMIN 3.0*   No results found for this basename: LIPASE, AMYLASE,  in the last 168 hours No results found for this basename: AMMONIA,  in the last 168 hours CBC:  Recent Labs Lab 09/08/13 0105  WBC 8.5  NEUTROABS 7.2  HGB 10.6*  HCT 32.7*  MCV 99.1  PLT 236   Cardiac Enzymes:  Recent Labs Lab 09/08/13 0105  TROPONINI <0.30    CBG: No results found for this basename: GLUCAP,  in the last 168 hours   Radiological Exams on Admission: Dg Chest 2 View  09/08/2013   CLINICAL DATA:  Altered mental status  EXAM: CHEST  2 VIEW  COMPARISON:  Prior radiograph from 09/07/2013  FINDINGS: Cardiac and mediastinal silhouettes are unchanged, and remain within normal limits.  Lungs are mildly hypoinflated. Diffuse interstitial thickening with pulmonary vascular congestion is again seen, suggestive of mild pulmonary interstitial edema. Minimal blunting of the left costophrenic angle is suggestive of a small left pleural effusion. No definite focal infiltrate identified. No pneumothorax.  No acute osseous abnormality.  IMPRESSION: Persistent diffuse interstitial prominence, suggestive of mild pulmonary interstitial edema. Findings are not significantly changed relative to prior  study.  Probable small left pleural effusion.   Electronically Signed   By: Jeannine Boga M.D.   On: 09/08/2013 01:35   Dg Chest 2 View  09/07/2013   CLINICAL DATA:  Cough and weakness.  EXAM: CHEST  2 VIEW  COMPARISON:  August 31, 2012 PA and lateral chest  FINDINGS: The lungs remain hypoinflated.  The interstitial markings are slightly more conspicuous bilaterally today than on the previous study. The hilar structures are less distinct. The cardiac silhouette is top-normal in size. No definite pleural effusion is demonstrated. There is no evidence of a pneumothorax. Again demonstrated are wedge compressions of upper and mid thoracic vertebral bodies which do not appear significantly changed.  IMPRESSION: Mildly increased interstitial markings bilaterally may reflect interstitial edema of cardiac or noncardiac cause. There is no discrete alveolar pneumonia.   Electronically Signed   By: David  Martinique   On: 09/07/2013 15:45   Ct Head Wo Contrast  09/08/2013   CLINICAL DATA:  Fall.  EXAM: CT HEAD WITHOUT CONTRAST  TECHNIQUE: Contiguous axial images were obtained from the base of the skull through the vertex without intravenous contrast.  COMPARISON:  05/16/2011  FINDINGS: Skull and Sinuses:Stable appearance of multiple small lucent areas within the calvarium. The partly imaged maxillary sinuses have bilateral effusions.  Orbits: No acute abnormality.  Brain: No evidence of acute abnormality, such as acute infarction, hemorrhage, hydrocephalus, or mass lesion/mass effect. Generalized atrophy, stable from 2012.  IMPRESSION: No evidence of acute intracranial injury.  Maxillary sinus effusions.   Electronically Signed   By: Jorje Guild M.D.   On: 09/08/2013 02:43    Assessment/Plan Present on Admission:  . Weakness generalized . LEG PAIN, RIGHT . MULTIPLE  MYELOMA . Gait instability . DVT (deep venous thrombosis) . DIABETES MELLITUS, TYPE II . DNR no code (do not resuscitate) . HYPERTENSION .  HYPERLIPIDEMIA . Weakness   PLAN:  Will admit her for generalized weakness, UTI, volume depletion, and possibly adrenal insufficiency as well.  Will give her IVF, with caution given CXR with some instertitial edema, but she is hypotensive now.  Will give Rocephin for UTI.  Will continue her current steroid dose, with IV solucortef.   Will need to hold her Lisinopril and diuretics at this point.  For her DM, will give SSI.  She could have a viral infection, and Tamiflu will be started pending PCR, with droplet precaution.  She is stable, DNR (confirmed tonight), and will be admitted to telemetry under [facility] service.  Thank you for asking me to participate in her care.  Other plans as per orders.  Code Status: DNR.   Orvan Falconer, MD. [facility] Pager [phone] 7pm to 7am.  09/08/2013, 4:30 AM

## 2013-09-08 NOTE — Telephone Encounter (Signed)
Patient has been admitted to the hospital [facility]) for UTI and the flu.  Daughter says she thinks she was admitted so that she can be monitored closely because of her cancer.

## 2013-09-08 NOTE — Progress Notes (Signed)
[name] [id number] Code Status: Full Admission Data: 09/08/2013 4:09 PM Attending Provider:  Inis Sizer IOE:VOJJKK [name], MD Consults/ Treatment Team:    [name] is a 78 y.o. female patient admitted from ED awake, alert - oriented  X 3 - no acute distress noted.  VSS - Blood pressure 121/58, pulse 75, temperature 97.7 F (36.5 C), temperature source Oral, resp. rate 19, height _0  (1.473 m), weight 59.421 kg (131 lb), SpO2 100.00%.  no c/o shortness of breath, no c/o chest pain.   IV Fluids:  IV in place, occlusive dsg intact without redness, IV cath hand left, condition patent and no redness none.  Allergies:  No Known Allergies   Past Medical History  Diagnosis Date  . Blood transfusion   . Depression     Mild  . History of chicken pox   . Hyperlipidemia   . Hypertension   . Degenerative joint disease   . Diabetes mellitus     Steroid related  . Phlebitis   . Multiple myeloma     per Dr. Julien Nordmann, s/p palliative radiation for leg pain 2011 per Dr. Sondra Come  . Deep vein thrombosis of bilateral lower extremities    Medications Prior to Admission  Medication Sig Dispense Refill  . acyclovir (ZOVIRAX) 400 MG tablet Take 400 mg by mouth 2 (two) times daily.      Marland Kitchen ALPRAZolam (XANAX) 0.5 MG tablet Take 0.5 mg by mouth 2 (two) times daily as needed for anxiety.      . Calcium Carbonate-Vitamin D 600-400 MG-UNIT per tablet Take 1 tablet by mouth 3 (three) times daily with meals.        Marland Kitchen dexamethasone (DECADRON) 4 MG tablet Take 40 mg by mouth once a week.      . gabapentin (NEURONTIN) 100 MG capsule Take 100-400 mg by mouth daily.      Marland Kitchen glipiZIDE (GLUCOTROL XL) 2.5 MG 24 hr tablet Take 1 tablet (2.5 mg total) by mouth daily.  90 tablet  3  . glucose blood (ONE TOUCH ULTRA TEST) test strip Use as instructed  25 each  6  . ibuprofen (ADVIL,MOTRIN) 200 MG tablet Take 200 mg by mouth every 6 (six) hours as needed.        . Lancets (ONETOUCH ULTRASOFT) lancets Test blood sugar once  daily or as directed.  100 each  11  . lisinopril-hydrochlorothiazide (PRINZIDE,ZESTORETIC) 20-12.5 MG per tablet TAKE ONE-HALF TABLET BY MOUTH EVERY DAY  45 tablet  1  . magnesium oxide (MAG-OX) 400 MG tablet Take 400 mg by mouth daily.       Marland Kitchen oxyCODONE (OXY IR/ROXICODONE) 5 MG immediate release tablet 2.5-5 mg every 6 (six) hours as needed for severe pain or breakthrough pain. Take 1 1/2 tabs by mouth every 6 hours as needed for pain      . OxyCODONE (OXYCONTIN) 10 mg T12A 12 hr tablet Take 1 tablet (10 mg total) by mouth every 12 (twelve) hours.  60 tablet  0  . pomalidomide (POMALYST) 4 MG capsule Take 4 mg by mouth daily. Take with water on days 1-21. Repeat every 28 days.   Last survey   06/06/13.   Authorization  #   [id number]   08/19/13. Adult female not of reproductive potential.      . potassium chloride SA (K-DUR,KLOR-CON) 20 MEQ tablet Take 1 tablet (20 mEq total) by mouth daily as needed.  10 tablet  0  . predniSONE (DELTASONE) 20 MG tablet Take 20 mg  by mouth once a week.      . prochlorperazine (COMPAZINE) 10 MG tablet Take 1 tablet (10 mg total) by mouth every 6 (six) hours as needed.  30 tablet  1  . temazepam (RESTORIL) 30 MG capsule Take 30 mg by mouth at bedtime as needed.      . warfarin (COUMADIN) 5 MG tablet Take 2.5-5 mg by mouth daily at 6 PM. Take one by mouth daily on Monday & Wednesday. Take 1/2 tablet on all other days or as directed         Orientation to room, and floor completed with information packet given to patient/family.  Patient  safety video watched at this time.  Admission INP armband ID verified with patient/family, and in place.   SR up x 2, fall assessment complete, with patient and family able to verbalize understanding of risk associated with falls, and verbalized understanding to call nsg before up out of bed.  Call light within reach, patient able to voice, and demonstrate understanding.  Skin, clean-dry- intact without evidence of bruising, or skin  tears.   No evidence of skin break down noted on exam.     Will cont to eval and treat per MD orders.  Henriette Combs, [place] 09/08/2013 4:09 PM

## 2013-09-08 NOTE — Telephone Encounter (Signed)
Error in charting, pt admitted with the flu and a UTI, not PNA.  SLJ

## 2013-09-08 NOTE — ED Notes (Signed)
Patient last radiation treatment was 1/6 per family

## 2013-09-08 NOTE — Progress Notes (Signed)
H&P note states pt is DNR. Code status in chart states full code. Spoke with Eliott Nine (daughter) and pt. Both were not sure and pt and daughter stated that another daughter made those decisions. Attempted to call other daughter, but there was no answer. Will pass on to day shift RN to address in the morning.

## 2013-09-08 NOTE — ED Notes (Signed)
Patient brought by [facility] from home for falling around 11:30pm tonight.  unwitnessed fall, on the floor, unclear from family the baseline, patient was able to tell paramedic her name.  Patient is a cancer patient and started her radiation on 12/30 and every time she gets radiating-she get weaker.  Patient takes narcotics and was taken today.  Last normal was around 5pm.  CBG 135.  120/70, 93HR, 92% R/a-99% on 2L.

## 2013-09-08 NOTE — Progress Notes (Addendum)
ANTICOAGULATION CONSULT NOTE - Initial Consult  Pharmacy Consult for Coumadin Indication: h/o b/l and recurrent DVT  No Known Allergies  Patient Measurements: Height: 4\' 10"  (147.3 cm) Weight: 131 lb (59.421 kg) IBW/kg (Calculated) : 40.9  Vital Signs: Temp: 97.9 F (36.6 C) (01/08 0604) Temp src: Oral (01/08 0050) BP: 99/57 mmHg (01/08 0815) Pulse Rate: 62 (01/08 0815)  Labs:  Recent Labs  09/08/13 0105  HGB 10.6*  HCT 32.7*  PLT 236  LABPROT 28.2*  INR 2.76*  CREATININE 1.49*  TROPONINI <0.30    Estimated Creatinine Clearance: 24.9 ml/min (by C-G formula based on Cr of 1.49).   Medical History: Past Medical History  Diagnosis Date  . Blood transfusion   . Depression     Mild  . History of chicken pox   . Hyperlipidemia   . Hypertension   . Degenerative joint disease   . Diabetes mellitus     Steroid related  . Phlebitis   . Multiple myeloma     per Dr. Julien Nordmann, s/p palliative radiation for leg pain 2011 per Dr. Sondra Come  . Deep vein thrombosis of bilateral lower extremities     Medications:  See electronic med rec  Assessment: 78 y.o. female presents with weakness, lethargy, fall with no LOC. Pt on chronic coumadin for h/o bilateral and recurrent DVT. Head CT neg. Baseline INR therapeutic. Hgb low at baseline but stable. Pt also with multiple myeloma and receiving chemo and radiation tx. Home dose: 2.5mg  daily except for 5mg  on Mon & Wed  Goal of Therapy:  INR 2-3 Monitor platelets by anticoagulation protocol: Yes   Plan:  1. Coumadin 2.5mg  po tonight 2. Daily INR  Sherlon Handing, PharmD, BCPS Clinical pharmacist, pager [phone] 09/08/2013,10:17 AM  **Pt daughter also confirmed that she just spoke with Dr. Julien Nordmann, oncologist, and he wants to hold Pomalyst for now**

## 2013-09-08 NOTE — ED Notes (Signed)
Pt placed on bedside commode. Pt able to urinate and defecate with assistance x1.  Pt given breakfast meal tray.

## 2013-09-08 NOTE — ED Notes (Signed)
Diet tray ordered 

## 2013-09-08 NOTE — Progress Notes (Addendum)
[facility] PROGRESS NOTE  [name] KKX:[id number] DOB: 08-21-1936 DOA: 09/08/2013 PCP: [name], MD  [name] is an 78 y.o. 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  Presented to the ER after a fell tonight (mechanical fall) with no LOC.  The family reports that last Sat/Sun the patient had diarrhea.  Starting on Monday (3 days prior to admission) the family noticed the patient was slow to respond, stuttering and unsteady on her feet.  These symptoms progressively became worse.  Evaluation in the ER included a CXR which was negative, CT of head showing no acute process, a normal WBC and normal renal fx tests. She was found to have a UTI, and was a bit hypotensive in the ER as well.   Assessment/Plan:  Altered mental status and weakness CT Head is negative. Possibly due to UTI +/- the cumulative effects of chemotherapy and radiation.  Adrenal insufficiency is also a consideration. Patient's home dose steroids will be continued and supplemented with solucortef. Will provide gentle IV fluids, physical therapy and monitor her progress.  UTI U/A positive for infection Culture pending Started on Rocephin  Acute Renal Failure Secondary to a combination of UTI, dehydration and chemotherapy. Will provide gentle IV hydration, hold nephrotoxic agents:  Pomalyst, Zestoretic  Cough with Upper Respiratory viral syndrome. Supportive care - nebulizers, mucolytics Influenza PCR negative  DM Hgb A1C was 6.7 in 04/2013.  Will recheck SSI - Sensitive. Regular diet as patient is not eating well.  HTN/Hypotension BP relatively low (WNL) Zestoretic on hold.  Chronic pain Continue home dose chronic pain  medications.  Multiple Myeloma Patient's calcium elevated PTH decreased. Management per Onc.   DVT Prophylaxis:  Chronic coumadin.  Code Status: Full code Family Communication: 2 daughters and brother at bedside Disposition Plan: inpatient.   Consultants:  Dr. Julien Nordmann is aware patient is inhouse  Procedures:    Antibiotics:  Rocephin  HPI/Subjective: History primarily gathered from daughter as patient is unable to express herself well or recall details of the past few days. No complaints of pain, general complaints of feeling poorly.  Objective: Filed Vitals:   09/08/13 1300 09/08/13 1400 09/08/13 1500 09/08/13 1601  BP: 130/59 133/60 121/63 121/58  Pulse: 71 74 74 75  Temp:    97.7 F (36.5 C)  TempSrc:      Resp: $Remo'26 23 18 19  'ykuVw$ Height:      Weight:      SpO2: 100% 99% 100% 100%   No intake or output data in the 24 hours ending 09/08/13 1635 Filed Weights   09/08/13 0050  Weight: 59.421 kg (131 lb)    Exam: General: Thin frail, stutters, lying in the ED.  HEENT:  PERR, EOMI, Anicteic Sclera, MM appear slightly dry. Neck: Supple, no JVD, no masses  Cardiovascular: RRR, S1 S2 auscultated, no rubs, murmurs or gallops.   Respiratory: min bilateral crackles.  Patient does not deeply inspire. Abdomen: Soft, NT., nondistended, + bowel sounds  Extremities: warm dry without cyanosis clubbing or edema. Weak, but 5/5 strength in each. Neuro: AAOx3, cranial nerves grossly intact. Strength 5/5 in upper and lower extremities  Skin: Without rashes exudates or nodules.   Psych: slow to respond, stutters.  Cooperative. Orientated to person and  place.  Recognized family.   Data Reviewed: Basic Metabolic Panel:  Recent Labs Lab 09/08/13 0105  NA 139  K 4.1  CL 98  CO2 26  GLUCOSE 122*  BUN 26*  CREATININE 1.49*  CALCIUM 9.3   Liver Function Tests:  Recent Labs Lab 09/08/13 0105  AST 21  ALT 15  ALKPHOS 44  BILITOT 0.4  PROT 6.3  ALBUMIN 3.0*    CBC:  Recent Labs Lab 09/08/13 0105  WBC 8.5  NEUTROABS 7.2  HGB 10.6*  HCT 32.7*  MCV 99.1  PLT 236   Cardiac Enzymes:  Recent Labs Lab 09/08/13 0105  TROPONINI <0.30     Studies: Dg Chest 2 View  09/08/2013   CLINICAL DATA:  Altered mental status  EXAM: CHEST  2 VIEW  COMPARISON:  Prior radiograph from 09/07/2013  FINDINGS: Cardiac and mediastinal silhouettes are unchanged, and remain within normal limits.  Lungs are mildly hypoinflated. Diffuse interstitial thickening with pulmonary vascular congestion is again seen, suggestive of mild pulmonary interstitial edema. Minimal blunting of the left costophrenic angle is suggestive of a small left pleural effusion. No definite focal infiltrate identified. No pneumothorax.  No acute osseous abnormality.  IMPRESSION: Persistent diffuse interstitial prominence, suggestive of mild pulmonary interstitial edema. Findings are not significantly changed relative to prior study.  Probable small left pleural effusion.   Electronically Signed   By: Jeannine Boga M.D.   On: 09/08/2013 01:35   Dg Chest 2 View  09/07/2013   CLINICAL DATA:  Cough and weakness.  EXAM: CHEST  2 VIEW  COMPARISON:  August 31, 2012 PA and lateral chest  FINDINGS: The lungs remain hypoinflated. The interstitial markings are slightly more conspicuous bilaterally today than on the previous study. The hilar structures are less distinct. The cardiac silhouette is top-normal in size. No definite pleural effusion is demonstrated. There is no evidence of a pneumothorax. Again demonstrated are wedge compressions of upper and mid thoracic vertebral bodies which do not appear significantly changed.  IMPRESSION: Mildly increased interstitial markings bilaterally may reflect interstitial edema of cardiac or noncardiac cause. There is no discrete alveolar pneumonia.   Electronically Signed   By: David  Martinique   On: 09/07/2013 15:45   Ct Head Wo Contrast  09/08/2013   CLINICAL DATA:   Fall.  EXAM: CT HEAD WITHOUT CONTRAST  TECHNIQUE: Contiguous axial images were obtained from the base of the skull through the vertex without intravenous contrast.  COMPARISON:  05/16/2011  FINDINGS: Skull and Sinuses:Stable appearance of multiple small lucent areas within the calvarium. The partly imaged maxillary sinuses have bilateral effusions.  Orbits: No acute abnormality.  Brain: No evidence of acute abnormality, such as acute infarction, hemorrhage, hydrocephalus, or mass lesion/mass effect. Generalized atrophy, stable from 2012.  IMPRESSION: No evidence of acute intracranial injury.  Maxillary sinus effusions.   Electronically Signed   By: Jorje Guild M.D.   On: 09/08/2013 02:43    Scheduled Meds: . acyclovir  400 mg Oral BID  . albuterol  2.5 mg Nebulization QID  . cefTRIAXone (ROCEPHIN)  IV  1 g Intravenous Q24H  . dexamethasone  40 mg Oral Weekly  . gabapentin  100-400 mg Oral Daily  . guaiFENesin  600 mg Oral BID  . hydrocortisone sod succinate (SOLU-CORTEF) inj  50 mg Intravenous Q8H  . insulin aspart  0-9 Units Subcutaneous TID WC  . OxyCODONE  10 mg Oral Q12H  . potassium chloride SA  20 mEq Oral BID  . predniSONE  20 mg Oral Weekly  . warfarin  2.5 mg Oral ONCE-1800  . Warfarin - Pharmacist Dosing Inpatient   Does not apply q1800   Continuous Infusions: . dextrose 5 % and 0.9% NaCl      Principal Problem:   Weakness generalized Active Problems:   UTI (urinary tract infection)   Acute renal failure   Acute upper respiratory infections of unspecified site   MULTIPLE  MYELOMA   DIABETES MELLITUS, TYPE II   HYPERLIPIDEMIA   HYPERTENSION   LEG PAIN, RIGHT   Gait instability   DVT (deep venous thrombosis)   DNR no code (do not resuscitate)   Chronic steroid use   Chronic anticoagulation   Weakness    Karen Kitchens  [facility] Pager [phone]. If 7PM-7AM, please contact night-coverage at [email or web address], password [facility] 09/08/2013, 4:35 PM  LOS: 0  days

## 2013-09-09 ENCOUNTER — Ambulatory Visit: Payer: Medicare Other

## 2013-09-09 ENCOUNTER — Encounter (HOSPITAL_COMMUNITY): Payer: Self-pay | Admitting: General Practice

## 2013-09-09 DIAGNOSIS — N390 Urinary tract infection, site not specified: Principal | ICD-10-CM

## 2013-09-09 LAB — GLUCOSE, CAPILLARY
GLUCOSE-CAPILLARY: 188 mg/dL — AB (ref 70–99)
Glucose-Capillary: 197 mg/dL — ABNORMAL HIGH (ref 70–99)
Glucose-Capillary: 162 mg/dL — ABNORMAL HIGH (ref 70–99)
Glucose-Capillary: 196 mg/dL — ABNORMAL HIGH (ref 70–99)

## 2013-09-09 LAB — PROTIME-INR
INR: 3.07 — AB (ref 0.00–1.49)
Prothrombin Time: 30.6 seconds — ABNORMAL HIGH (ref 11.6–15.2)

## 2013-09-09 LAB — CBC
HCT: 27.7 % — ABNORMAL LOW (ref 36.0–46.0)
Hemoglobin: 8.9 g/dL — ABNORMAL LOW (ref 12.0–15.0)
MCH: 31.7 pg (ref 26.0–34.0)
MCHC: 32.1 g/dL (ref 30.0–36.0)
MCV: 98.6 fL (ref 78.0–100.0)
PLATELETS: 209 10*3/uL (ref 150–400)
RBC: 2.81 MIL/uL — ABNORMAL LOW (ref 3.87–5.11)
RDW: 15.6 % — AB (ref 11.5–15.5)
WBC: 3.8 10*3/uL — AB (ref 4.0–10.5)

## 2013-09-09 LAB — BASIC METABOLIC PANEL
BUN: 15 mg/dL (ref 6–23)
CALCIUM: 8.3 mg/dL — AB (ref 8.4–10.5)
CO2: 20 mEq/L (ref 19–32)
CREATININE: 0.97 mg/dL (ref 0.50–1.10)
Chloride: 110 mEq/L (ref 96–112)
GFR calc Af Amer: 65 mL/min — ABNORMAL LOW (ref >90)
GFR, EST NON AFRICAN AMERICAN: 56 mL/min — AB (ref >90)
Glucose, Bld: 153 mg/dL — ABNORMAL HIGH (ref 70–99)
Potassium: 4.1 mEq/L (ref 3.7–5.3)
Sodium: 142 mEq/L (ref 137–147)

## 2013-09-09 LAB — TSH: TSH: 0.619 u[IU]/mL (ref 0.350–4.500)

## 2013-09-09 MED ORDER — SODIUM CHLORIDE 0.9 % IV SOLN
INTRAVENOUS | Status: DC
  Administered 2013-09-09 – 2013-09-11 (×4): via INTRAVENOUS

## 2013-09-09 MED ORDER — ALBUTEROL SULFATE (2.5 MG/3ML) 0.083% IN NEBU: 2.5000 mg | INHALATION_SOLUTION | RESPIRATORY_TRACT | Status: DC | PRN

## 2013-09-09 NOTE — Clinical Documentation Improvement (Signed)
  Patient admitted with 'AMS". In the Coding world this term is considered nonspecific and low weighted. If possible please clarify "altered mental status" to better illustrate the severity of illness and risk of mortality. Thank you.  Possible Clinical Conditions?  - Encephalopathy (describe type if known)                       Anoxic                       Septic                       Alcoholic                        Hepatic                       Hypertensive                       Metabolic                       Toxic - early Sepsis resolved - Other Condition  Supporting Information: - UTI, Acute renal failure, viral respiratory syndrome, hypotension, MM on chemo and radiation, adrenal insufficiency - Signs & Symptoms: altered mental status, stuttering, s/p mechanical fall, weakness, hypotension - Treatment: D5NS IV 138ml/hr, IV Decadron, IV Solulcortef, Rocephin IV, Acyclovir  Thank You, Ezekiel Ina ,RN Clinical Documentation Specialist:  [place] Information Management

## 2013-09-09 NOTE — Progress Notes (Signed)
Pt HR in high 40's to low 50's. Pt is currently sleeping in bed. Will pass on to day shift RN.

## 2013-09-09 NOTE — Progress Notes (Addendum)
[facility] PROGRESS NOTE  [name] HAF:[id number] DOB: 07-13-1936 DOA: 09/08/2013 PCP: [name], MD  [name] is an 78 y.o. 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  Presented to the ER after a fell tonight (mechanical fall) with no LOC.  The family reports that last Sat/Sun the patient had diarrhea.  Starting on Monday (3 days prior to admission) the family noticed the patient was slow to respond, stuttering and unsteady on her feet.  These symptoms progressively became worse.  Evaluation in the ER included a CXR which was negative, CT of head showing no acute process, a normal WBC and normal renal fx tests. She was found to have a UTI, and was a bit hypotensive in the ER as well.   Assessment/Plan:  Altered mental status and weakness CT Head is negative. -Was likely due to UTI -She is clinically improved-her daughter at bedside close to her baseline -Continue IV fluids, physical therapy and monitor her progress.  GNR UTI U/A positive for infection Culture with gram-negative rods -Continue Rocephin -Patient improving clinically  Acute Renal Failure Secondary to a combination of UTI, dehydration and chemotherapy. -Resolved with hydration and treatment of UTI  Cough with Upper Respiratory viral syndrome. Supportive care - nebulizers, mucolytics -Influenza PCR negative  DM Hgb A1C was 6.7 in 04/2013.  Will recheck SSI - Sensitive. Continue Regular diet as patient is not eating well.  HTN/Hypotension -BP now stable hypotension resolved, follow and resume outpatient meds when appropriate  Chronic pain Continue home dose chronic pain medications.  Multiple Myeloma -Hypercalcemia resolved with  hydration PTH decreased. Management per Onc.   DVT Prophylaxis:  Chronic coumadin.  Code Status: Full code Family Communication: Daughter at bedside Disposition Plan: To home when medically ready   Consultants:  Dr. MoJulien Nordmannas informed that patient is inhouse  Procedures:    Antibiotics:  Rocephin started on 1/8  HPI/Subjective:  patient alert and appropriate. She denies nausea or vomiting. States she feels better today.  Objective: Filed Vitals:   09/08/13 2037 09/08/13 2038 09/09/13 0644 09/09/13 1445  BP:  117/73 114/65 139/67  Pulse:  77 48 71  Temp:  99.4 F (37.4 C) 97.8 F (36.6 C) 98.2 F (36.8 C)  TempSrc:  Oral Oral Oral  Resp:  _0 Height:      Weight:      SpO2: 98% 95% 96% 97%    Intake/Output Summary (Last 24 hours) at 09/09/13 1846 Last data filed at 09/09/13 1814  Gross per 24 hour  Intake   1480 ml  Output      1 ml  Net   1479 ml   Filed Weights   09/08/13 0050  Weight: 59.421 kg (131 lb)    Exam: General: Alert and appropriate, in NAD HEENT:  PERR, EOMI, Anicteic Sclera, MM appear slightly dry. Neck: Supple, no JVD, no masses  Cardiovascular: RRR, S1 S2 auscultated, no rubs, murmurs or gallops.   Respiratory: Decreased breath sounds at the bases, no crackles or wheezes Patient does not deeply inspire. Abdomen: Soft, NT., nondistended, + bowel sounds  Extremities: Trace edema no cyanosis. Neuro: AAOx3, cranial nerves grossly intact. Strength 5/5 in upper and lower extremities    Data Reviewed: Basic Metabolic  Panel:  Recent Labs Lab 09/08/13 0105 09/09/13 0608  NA 139 142  K 4.1 4.1  CL 98 110  CO2 26 20  GLUCOSE 122* 153*  BUN 26* 15  CREATININE 1.49* 0.97  CALCIUM 9.3 8.3*   Liver Function Tests:  Recent Labs Lab 09/08/13 0105  AST 21  ALT 15  ALKPHOS 44  BILITOT 0.4  PROT 6.3  ALBUMIN 3.0*   CBC:  Recent Labs Lab 09/08/13 0105 09/09/13 0608  WBC 8.5 3.8*  NEUTROABS 7.2  --   HGB 10.6*  8.9*  HCT 32.7* 27.7*  MCV 99.1 98.6  PLT 236 209   Cardiac Enzymes:  Recent Labs Lab 09/08/13 0105  TROPONINI <0.30     Studies: Dg Chest 2 View  09/08/2013   CLINICAL DATA:  Altered mental status  EXAM: CHEST  2 VIEW  COMPARISON:  Prior radiograph from 09/07/2013  FINDINGS: Cardiac and mediastinal silhouettes are unchanged, and remain within normal limits.  Lungs are mildly hypoinflated. Diffuse interstitial thickening with pulmonary vascular congestion is again seen, suggestive of mild pulmonary interstitial edema. Minimal blunting of the left costophrenic angle is suggestive of a small left pleural effusion. No definite focal infiltrate identified. No pneumothorax.  No acute osseous abnormality.  IMPRESSION: Persistent diffuse interstitial prominence, suggestive of mild pulmonary interstitial edema. Findings are not significantly changed relative to prior study.  Probable small left pleural effusion.   Electronically Signed   By: Jeannine Boga M.D.   On: 09/08/2013 01:35   Ct Head Wo Contrast  09/08/2013   CLINICAL DATA:  Fall.  EXAM: CT HEAD WITHOUT CONTRAST  TECHNIQUE: Contiguous axial images were obtained from the base of the skull through the vertex without intravenous contrast.  COMPARISON:  05/16/2011  FINDINGS: Skull and Sinuses:Stable appearance of multiple small lucent areas within the calvarium. The partly imaged maxillary sinuses have bilateral effusions.  Orbits: No acute abnormality.  Brain: No evidence of acute abnormality, such as acute infarction, hemorrhage, hydrocephalus, or mass lesion/mass effect. Generalized atrophy, stable from 2012.  IMPRESSION: No evidence of acute intracranial injury.  Maxillary sinus effusions.   Electronically Signed   By: Jorje Guild M.D.   On: 09/08/2013 02:43    Scheduled Meds: . acyclovir  400 mg Oral BID  . cefTRIAXone (ROCEPHIN)  IV  1 g Intravenous Q24H  . dexamethasone  40 mg Oral Weekly  . gabapentin  100 mg Oral Daily  .  guaiFENesin  600 mg Oral BID  . insulin aspart  0-9 Units Subcutaneous TID WC  . OxyCODONE  10 mg Oral Q12H  . potassium chloride SA  20 mEq Oral BID  . [START ON 09/12/2013] predniSONE  20 mg Oral Weekly  . Warfarin - Pharmacist Dosing Inpatient   Does not apply q1800   Continuous Infusions: . dextrose 5 % and 0.9% NaCl 100 mL/hr at 09/09/13 1414    Principal Problem:   Weakness generalized Active Problems:   MULTIPLE  MYELOMA   DIABETES MELLITUS, TYPE II   HYPERLIPIDEMIA   HYPERTENSION   LEG PAIN, RIGHT   Gait instability   DVT (deep venous thrombosis)   DNR no code (do not resuscitate)   Chronic steroid use   Chronic anticoagulation   Weakness   UTI (urinary tract infection)   Acute renal failure   Acute upper respiratory infections of unspecified site    Sheila Oats, MD  [facility] Pager [phone]. If 7PM-7AM, please contact night-coverage at [email or web address], password [facility] 09/09/2013, 6:46 PM  LOS: 1 day

## 2013-09-09 NOTE — Care Management Note (Unsigned)
    Page 1 of 1   09/09/2013     5:13:09 PM   CARE MANAGEMENT NOTE 09/09/2013  Patient:  [name], [name]   Account Number:  [id number]  Date Initiated:  09/09/2013  Documentation initiated by:  Tomi Bamberger  Subjective/Objective Assessment:   dx weakness  admit- lives with daughter.     Action/Plan:   Anticipated DC Date:  09/11/2013   Anticipated DC Plan:  [place]  CM consult      Choice offered to / List presented to:             Status of service:  In process, will continue to follow Medicare Important Message given?   (If response is "NO", the following Medicare IM given date fields will be blank) Date Medicare IM given:   Date Additional Medicare IM given:    Discharge Disposition:    Per UR Regulation:  Reviewed for med. necessity/level of care/duration of stay  If discussed at [employer] of Stay Meetings, dates discussed:    Comments:  09/09/13 17:12 Tomi Bamberger RN, BSN [phone] patient lives with daughter.  NCM will continue to follow for dc needs.

## 2013-09-09 NOTE — Progress Notes (Signed)
Utilization review completed. Sophea Rackham, RN, BSN. 

## 2013-09-09 NOTE — Progress Notes (Signed)
ANTICOAGULATION CONSULT NOTE - Follow Up Consult  Pharmacy Consult for Coumadin Indication: history of bilateral and recurrent DVTs  No Known Allergies  Patient Measurements: Height: 4\' 10"  (147.3 cm) Weight: 131 lb (59.421 kg) IBW/kg (Calculated) : 40.9  Vital Signs: Temp: 97.8 F (36.6 C) (01/09 0644) Temp src: Oral (01/09 0644) BP: 114/65 mmHg (01/09 0644) Pulse Rate: 48 (01/09 0644)  Labs:  Recent Labs  09/08/13 0105 09/09/13 0608  HGB 10.6* 8.9*  HCT 32.7* 27.7*  PLT 236 209  LABPROT 28.2* 30.6*  INR 2.76* 3.07*  CREATININE 1.49* 0.97  TROPONINI <0.30  --     Estimated Creatinine Clearance: 38.2 ml/min (by C-G formula based on Cr of 0.97).   Medications:  Scheduled:  . acyclovir  400 mg Oral BID  . cefTRIAXone (ROCEPHIN)  IV  1 g Intravenous Q24H  . dexamethasone  40 mg Oral Weekly  . gabapentin  100 mg Oral Daily  . guaiFENesin  600 mg Oral BID  . insulin aspart  0-9 Units Subcutaneous TID WC  . OxyCODONE  10 mg Oral Q12H  . potassium chloride SA  20 mEq Oral BID  . [START ON 09/12/2013] predniSONE  20 mg Oral Weekly  . Warfarin - Pharmacist Dosing Inpatient   Does not apply q1800    Assessment: 78 yo F on Coumadin PTA for hx of recurrent DVTs.  INR was therapeutic on admission but has trended up overnight.  Hgb is low and has trended down, but no bleeding noted.  Goal of Therapy:  INR 2-3 Monitor platelets by anticoagulation protocol: Yes   Plan:  No Coumadin tonight.  I'm concerned that INR may increase further with acute illness and decreased Hgb.  Will follow-up with INR in AM.  Horton Chin, Pharm.D., BCPS Clinical Pharmacist Pager [phone] 09/09/2013 1:14 PM

## 2013-09-10 DIAGNOSIS — R197 Diarrhea, unspecified: Secondary | ICD-10-CM

## 2013-09-10 DIAGNOSIS — E119 Type 2 diabetes mellitus without complications: Secondary | ICD-10-CM

## 2013-09-10 DIAGNOSIS — I10 Essential (primary) hypertension: Secondary | ICD-10-CM

## 2013-09-10 LAB — URINE CULTURE: Colony Count: >=100000

## 2013-09-10 LAB — PROTIME-INR
INR: 3.46 — AB (ref 0.00–1.49)
PROTHROMBIN TIME: 33.5 s — AB (ref 11.6–15.2)

## 2013-09-10 LAB — GLUCOSE, CAPILLARY
GLUCOSE-CAPILLARY: 162 mg/dL — AB (ref 70–99)
GLUCOSE-CAPILLARY: 117 mg/dL — AB (ref 70–99)
Glucose-Capillary: 157 mg/dL — ABNORMAL HIGH (ref 70–99)
Glucose-Capillary: 119 mg/dL — ABNORMAL HIGH (ref 70–99)

## 2013-09-10 MED ORDER — LORATADINE 10 MG PO TABS
10.0000 mg | ORAL_TABLET | Freq: Every day | ORAL | Status: DC
  Administered 2013-09-10 – 2013-09-12 (×3): 10 mg via ORAL
  Filled 2013-09-10 (×3): qty 1

## 2013-09-10 MED ORDER — LISINOPRIL 20 MG PO TABS
20.0000 mg | ORAL_TABLET | Freq: Every day | ORAL | Status: DC
  Administered 2013-09-10 – 2013-09-12 (×3): 20 mg via ORAL
  Filled 2013-09-10 (×3): qty 1

## 2013-09-10 MED ORDER — SACCHAROMYCES BOULARDII 250 MG PO CAPS
250.0000 mg | ORAL_CAPSULE | Freq: Two times a day (BID) | ORAL | Status: DC
  Administered 2013-09-10 – 2013-09-12 (×5): 250 mg via ORAL
  Filled 2013-09-10 (×6): qty 1

## 2013-09-10 NOTE — Progress Notes (Signed)
[facility] PROGRESS NOTE  [name] YIF:[id number] DOB: 1935/12/12 DOA: 09/08/2013 PCP: [name], MD  [name] is an 78 y.o. 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  Presented to the ER after a fell tonight (mechanical fall) with no LOC.  The family reports that last Sat/Sun the patient had diarrhea.  Starting on Monday (3 days prior to admission) the family noticed the patient was slow to respond, stuttering and unsteady on her feet.  These symptoms progressively became worse.  Evaluation in the ER included a CXR which was negative, CT of head showing no acute process, a normal WBC and normal renal fx tests. She was found to have a UTI, and was a bit hypotensive in the ER as well.   Assessment/Plan: Diarrhea  Will obtain stool for C. difficile as she is on antibiotics and at risk - add florastor, follow and further manage accordingly pending stool studies Altered mental status and weakness CT Head is negative. -Was likely due to UTI -She is clinically improved-her daughter at bedside close to her baseline -Continue current IV fluids -Seen by PT and no skilled needs identified  Klebsiella UTI U/A positive for infection Culture with Klebsiella sensitive to Rocephin -Continue Rocephin -Patient improving clinically  Acute Renal Failure Secondary to a combination of UTI, dehydration and chemotherapy. -Resolved with hydration and treatment of UTI  Cough with Upper Respiratory viral syndrome. Supportive care - nebulizers, mucolytics -Influenza PCR negative  DM Hgb A1C was 6.7 in 04/2013.  Will recheck SSI - Sensitive. Continue Regular diet as patient is not eating well.  HTN/Hypotension -BP now stable  hypotension resolved, follow and resume outpatient meds when appropriate  Chronic pain Continue home dose chronic pain medications.  Multiple Myeloma -Hypercalcemia resolved with hydration PTH decreased. Management per Onc.   DVT Prophylaxis:  Chronic coumadin.  Code Status: Full code Family Communication:2 Daughters Disposition Plan: To home when medically ready   Consultants:  Dr. MoJulien Nordmannas informed that patient is inhouse  Procedures:    Antibiotics:  Rocephin started on 1/8  HPI/Subjective:   diarrhea today, denies nausea or vomiting  Objective: Filed Vitals:   09/09/13 2020 09/10/13 0528 09/10/13 1130 09/10/13 1358  BP: 160/62 132/50  182/81  Pulse: 60 51  53  Temp: 97.3 F (36.3 C) 97.9 F (36.6 C)  97.6 F (36.4 C)  TempSrc: Oral Oral  Oral  Resp: _0 Height:      Weight:      SpO2: 97% 96% 95% 94%    Intake/Output Summary (Last 24 hours) at 09/10/13 1416 Last data filed at 09/10/13 [id number] per 24 hour  Intake 681.67 ml  Output      1 ml  Net 680.67 ml   Filed Weights   09/08/13 0050  Weight: 59.421 kg (131 lb)    Exam: General: Alert and appropriate, in NAD HEENT:  PERR, EOMI, Anicteic Sclera, MM appear slightly dry. Neck: Supple, no JVD, no masses  Cardiovascular: RRR, S1 S2 auscultated, no rubs, murmurs or gallops.   Respiratory: Decreased breath sounds at the bases, no crackles or wheezes Patient does not deeply inspire. Abdomen: Soft, NT., nondistended, + bowel sounds  Extremities: Trace edema no cyanosis. Neuro: AAOx3,  cranial nerves grossly intact. Strength 5/5 in upper and lower extremities    Data Reviewed: Basic Metabolic Panel:  Recent Labs Lab 09/08/13 0105 09/09/13 0608  NA 139 142  K 4.1 4.1  CL 98 110  CO2 26 20  GLUCOSE 122* 153*  BUN 26* 15  CREATININE 1.49* 0.97  CALCIUM 9.3 8.3*   Liver Function Tests:  Recent Labs Lab 09/08/13 0105  AST 21  ALT 15  ALKPHOS 44  BILITOT 0.4  PROT 6.3   ALBUMIN 3.0*   CBC:  Recent Labs Lab 09/08/13 0105 09/09/13 0608  WBC 8.5 3.8*  NEUTROABS 7.2  --   HGB 10.6* 8.9*  HCT 32.7* 27.7*  MCV 99.1 98.6  PLT 236 209   Cardiac Enzymes:  Recent Labs Lab 09/08/13 0105  TROPONINI <0.30     Studies: No results found.  Scheduled Meds: . acyclovir  400 mg Oral BID  . cefTRIAXone (ROCEPHIN)  IV  1 g Intravenous Q24H  . dexamethasone  40 mg Oral Weekly  . gabapentin  100 mg Oral Daily  . guaiFENesin  600 mg Oral BID  . insulin aspart  0-9 Units Subcutaneous TID WC  . OxyCODONE  10 mg Oral Q12H  . potassium chloride SA  20 mEq Oral BID  . [START ON 09/12/2013] predniSONE  20 mg Oral Weekly  . saccharomyces boulardii  250 mg Oral BID  . Warfarin - Pharmacist Dosing Inpatient   Does not apply q1800   Continuous Infusions: . sodium chloride 50 mL/hr at 09/09/13 1924    Principal Problem:   Weakness generalized Active Problems:   MULTIPLE  MYELOMA   DIABETES MELLITUS, TYPE II   HYPERLIPIDEMIA   HYPERTENSION   LEG PAIN, RIGHT   Gait instability   DVT (deep venous thrombosis)   DNR no code (do not resuscitate)   Chronic steroid use   Chronic anticoagulation   Weakness   UTI (urinary tract infection)   Acute renal failure   Acute upper respiratory infections of unspecified site    Sheila Oats, MD  [facility] Pager [phone]. If 7PM-7AM, please contact night-coverage at [email or web address], password [facility] 09/10/2013, 2:16 PM  LOS: 2 days

## 2013-09-10 NOTE — Progress Notes (Signed)
ANTICOAGULATION CONSULT NOTE - Follow Up Consult  Pharmacy Consult for Coumadin Indication: history of bilateral and recurrent DVTs  No Known Allergies  Patient Measurements: Height: 4\' 10"  (147.3 cm) Weight: 131 lb (59.421 kg) IBW/kg (Calculated) : 40.9  Vital Signs: Temp: 97.9 [name] (36.6 C) (01/10 0528) Temp src: Oral (01/10 0528) BP: 132/50 mmHg (01/10 0528) Pulse Rate: 51 (01/10 0528)  Labs:  Recent Labs  09/08/13 0105 09/09/13 0608 09/10/13 0343  HGB 10.6* 8.9*  --   HCT 32.7* 27.7*  --   PLT 236 209  --   LABPROT 28.2* 30.6* 33.5*  INR 2.76* 3.07* 3.46*  CREATININE 1.49* 0.97  --   TROPONINI <0.30  --   --     Estimated Creatinine Clearance: 38.2 ml/min (by C-G formula based on Cr of 0.97).   Medications:  Scheduled:  . acyclovir  400 mg Oral BID  . cefTRIAXone (ROCEPHIN)  IV  1 g Intravenous Q24H  . dexamethasone  40 mg Oral Weekly  . gabapentin  100 mg Oral Daily  . guaiFENesin  600 mg Oral BID  . insulin aspart  0-9 Units Subcutaneous TID WC  . OxyCODONE  10 mg Oral Q12H  . potassium chloride SA  20 mEq Oral BID  . [START ON 09/12/2013] predniSONE  20 mg Oral Weekly  . Warfarin - Pharmacist Dosing Inpatient   Does not apply q1800    Assessment: 78 yo [name] on Coumadin PTA for hx of recurrent DVTs.  INR was therapeutic on admission but continues to trend up as a result of acute illness and decreased PO intake.  Hgb is low and has trended down, but no bleeding noted.  Goal of Therapy:  INR 2-3 Monitor platelets by anticoagulation protocol: Yes   Plan:  No Coumadin tonight.  Will follow-up with INR in AM.  Horton Chin, Pharm.D., BCPS Clinical Pharmacist Pager [phone] 09/10/2013 11:07 AM

## 2013-09-10 NOTE — Evaluation (Signed)
Physical Therapy Evaluation Patient Details Name: [name] MRN: [id number] DOB: April 23, 1936 Today's Date: 09/10/2013 Time: 1125-1140 PT Time Calculation (min): 15 min  PT Assessment / Plan / Recommendation History of Present Illness  Pt adm with AMS and weakness likely due to UTI.  Clinical Impression  Pt's mobility at baseline per daughter.  Family available to assist at home as needed.  Left rolling walker in room and family to continue to amb pt in halls.    PT Assessment  Patent does not need any further PT services    Follow Up Recommendations  No PT follow up    Does the patient have the potential to tolerate intense rehabilitation      Barriers to Discharge        Equipment Recommendations  None recommended by PT    Recommendations for Other Services     Frequency      Precautions / Restrictions Precautions Precautions: Fall Restrictions Weight Bearing Restrictions: No   Pertinent Vitals/Pain See flow sheet.      Mobility  Transfers Overall transfer level: Modified independent Equipment used: Rolling walker (2 wheeled) Ambulation/Gait Ambulation/Gait assistance: Supervision Ambulation Distance (Feet): 120 Feet Assistive device: Rolling walker (2 wheeled) Gait Pattern/deviations: Step-through pattern;Decreased stride length Gait velocity interpretation: Below normal speed for age/gender    Exercises     PT Diagnosis:    PT Problem List:   PT Treatment Interventions:       PT Goals(Current goals can be found in the care plan section) Acute Rehab PT Goals PT Goal Formulation: No goals set, d/c therapy  Visit Information  Last PT Received On: 09/10/13 Assistance Needed: +1 History of Present Illness: Pt adm with AMS and weakness likely due to UTI.       Prior [place] expects to be discharged to:: Private residence Living Arrangements: Children Available Help at Discharge: Family Type of Home: House Home  Access: [place]: One [place]: Environmental consultant - 2 wheels;Shower seat;Bedside commode Prior Function Level of Independence: Independent with assistive device(s) Communication Communication: No difficulties    Cognition  Cognition Arousal/Alertness: Awake/alert Behavior During Therapy: WFL for tasks assessed/performed Overall Cognitive Status: Within Functional Limits for tasks assessed    Extremity/Trunk Assessment Upper Extremity Assessment Upper Extremity Assessment: Defer to OT evaluation Lower Extremity Assessment Lower Extremity Assessment: Overall WFL for tasks assessed   Balance Balance Overall balance assessment: History of Falls  End of Session PT - End of Session Activity Tolerance: Patient tolerated treatment well Patient left: in chair;with family/visitor present Nurse Communication: Mobility status  GP     [facility] 09/10/2013, 11:52 AM  Suanne Marker PT [phone]

## 2013-09-11 DIAGNOSIS — R5381 Other malaise: Secondary | ICD-10-CM

## 2013-09-11 DIAGNOSIS — R5383 Other fatigue: Secondary | ICD-10-CM

## 2013-09-11 LAB — GLUCOSE, CAPILLARY
GLUCOSE-CAPILLARY: 90 mg/dL (ref 70–99)
Glucose-Capillary: 124 mg/dL — ABNORMAL HIGH (ref 70–99)
Glucose-Capillary: 127 mg/dL — ABNORMAL HIGH (ref 70–99)
Glucose-Capillary: 123 mg/dL — ABNORMAL HIGH (ref 70–99)

## 2013-09-11 LAB — PROTIME-INR
INR: 2.8 — ABNORMAL HIGH (ref 0.00–1.49)
Prothrombin Time: 28.5 seconds — ABNORMAL HIGH (ref 11.6–15.2)

## 2013-09-11 MED ORDER — ACYCLOVIR 200 MG PO CAPS
400.0000 mg | ORAL_CAPSULE | Freq: Two times a day (BID) | ORAL | Status: DC
  Administered 2013-09-11 – 2013-09-12 (×2): 400 mg via ORAL
  Filled 2013-09-11 (×3): qty 2

## 2013-09-11 MED ORDER — WARFARIN SODIUM 2.5 MG PO TABS
2.5000 mg | ORAL_TABLET | Freq: Once | ORAL | Status: AC
  Administered 2013-09-11: 19:00:00 2.5 mg via ORAL
  Filled 2013-09-11: qty 1

## 2013-09-11 NOTE — Progress Notes (Signed)
ANTICOAGULATION CONSULT NOTE - Follow Up Consult  Pharmacy Consult for Coumadin Indication: history of bilateral and recurrent DVTs  No Known Allergies  Patient Measurements: Height: 4\' 10"  (147.3 cm) Weight: 131 lb (59.421 kg) IBW/kg (Calculated) : 40.9  Vital Signs: Temp: 97.8 F (36.6 C) (01/11 0518) Temp src: Oral (01/11 0518) BP: 155/75 mmHg (01/11 0614) Pulse Rate: 50 (01/11 0614)  Labs:  Recent Labs  09/09/13 0608 09/10/13 0343 09/11/13 0410  HGB 8.9*  --   --   HCT 27.7*  --   --   PLT 209  --   --   LABPROT 30.6* 33.5* 28.5*  INR 3.07* 3.46* 2.80*  CREATININE 0.97  --   --     Estimated Creatinine Clearance: 38.2 ml/min (by C-G formula based on Cr of 0.97).   Medications:  Scheduled:  . acyclovir  400 mg Oral BID  . cefTRIAXone (ROCEPHIN)  IV  1 g Intravenous Q24H  . dexamethasone  40 mg Oral Weekly  . gabapentin  100 mg Oral Daily  . guaiFENesin  600 mg Oral BID  . insulin aspart  0-9 Units Subcutaneous TID WC  . lisinopril  20 mg Oral Daily  . loratadine  10 mg Oral Daily  . OxyCODONE  10 mg Oral Q12H  . potassium chloride SA  20 mEq Oral BID  . [START ON 09/12/2013] predniSONE  20 mg Oral Weekly  . saccharomyces boulardii  250 mg Oral BID  . Warfarin - Pharmacist Dosing Inpatient   Does not apply q1800    Assessment: 78 yo F on Coumadin PTA for hx of recurrent DVTs.  INR now back within goal range after doses held x 2.  Will resume home dose.    Goal of Therapy:  INR 2-3 Monitor platelets by anticoagulation protocol: Yes   Plan:  Coumadin 2.5mg  PO x 1 tonight.  Will follow-up with INR in AM.  Horton Chin, Pharm.D., BCPS Clinical Pharmacist Pager [phone] 09/11/2013 10:37 AM

## 2013-09-11 NOTE — Progress Notes (Signed)
[facility] PROGRESS NOTE  [name] SFK:[id number] DOB: 09/14/1935 DOA: 09/08/2013 PCP: [name], MD  [name] is an 78 y.o. female with hx of multiple myeloma, currently undergoing chemothx and radiation therapy, hx of bilateral and recurrent DVT on chronic coumadin, hx of DM, chronic steroid use (Pred 6m once per week, Decadron 162mper week), hx of HTN, Hyperlipidemia, severe right OA with pain and weakness on her right knee.  Presented to the ER after a fell tonight (mechanical fall) with no LOC.  The family reports that last Sat/Sun the patient had diarrhea.  Starting on Monday (3 days prior to admission) the family noticed the patient was slow to respond, stuttering and unsteady on her feet.  These symptoms progressively became worse.  Evaluation in the ER included a CXR which was negative, CT of head showing no acute process, a normal WBC and normal renal fx tests. She was found to have a UTI, and was a bit hypotensive in the ER as well.   Assessment/Plan: Diarrhea  -await C. Difficile>>per nsg samples so far not adequate -continue florastor, follow and further manage accordingly pending stool studies Altered mental status and weakness CT Head is negative. -Was likely due to UTI -She is clinically improved-her daughter at bedside states pt at baseline -Continue current IV fluids -Seen by PT and no skilled needs identified  Klebsiella UTI U/A positive for infection Culture with Klebsiella sensitive to Rocephin -Continue Rocephin -Patient improving clinically  Acute Renal Failure Secondary to a combination of UTI, dehydration and chemotherapy. -Resolved with hydration and treatment of UTI  Cough with Upper Respiratory viral syndrome. Supportive care - nebulizers, mucolytics -Influenza PCR negative  DM Hgb A1C was 6.7 in 04/2013.  Will recheck SSI - Sensitive. Continue Regular diet as patient is not eating well.  HTN/Hypotension -BP now stable hypotension  resolved, follow and resume outpatient meds when appropriate  Chronic pain Continue home dose chronic pain medications.  Multiple Myeloma -Hypercalcemia resolved with hydration PTH decreased. Management per Onc.   DVT Prophylaxis:  Chronic coumadin.  Code Status: Full code Family Communication:2 Daughters Disposition Plan: To home when medically ready   Consultants:  Dr. MoJulien Nordmannas informed that patient is inhouse  Procedures:    Antibiotics:  Rocephin started on 1/8  HPI/Subjective:   diarrheax3 so far today, denies nausea or vomiting  Objective: Filed Vitals:   09/10/13 2150 09/10/13 2258 09/11/13 0518 09/11/13 0614  BP: 177/69  189/69 155/75  Pulse: 47 51 54 50  Temp: 98 F (36.7 C)  97.8 F (36.6 C)   TempSrc: Oral  Oral   Resp: 20  20   Height:      Weight:      SpO2: 98%  96%     Intake/Output Summary (Last 24 hours) at 09/11/13 1800 Last data filed at 09/11/13 1504  Gross per 24 hour  Intake 591.67 ml  Output    600 ml  Net  -8.33 ml   Filed Weights   09/08/13 0050  Weight: 59.421 kg (131 lb)    Exam: General: Alert and appropriate, in NAD HEENT:  PERR, EOMI, Anicteic Sclera, MM appear slightly dry. Neck: Supple, no JVD, no masses  Cardiovascular: RRR, S1 S2 auscultated, no rubs, murmurs or gallops.   Respiratory: Decreased breath sounds at the bases, no crackles or wheezes Patient does not deeply inspire. Abdomen: Soft, NT., nondistended, + bowel sounds  Extremities: Trace edema no cyanosis. Neuro: AAOx3, cranial nerves grossly intact. Strength 5/5 in upper  and lower extremities    Data Reviewed: Basic Metabolic Panel:  Recent Labs Lab 09/08/13 0105 09/09/13 0608  NA 139 142  K 4.1 4.1  CL 98 110  CO2 26 20  GLUCOSE 122* 153*  BUN 26* 15  CREATININE 1.49* 0.97  CALCIUM 9.3 8.3*   Liver Function Tests:  Recent Labs Lab 09/08/13 0105  AST 21  ALT 15  ALKPHOS 44  BILITOT 0.4  PROT 6.3  ALBUMIN 3.0*    CBC:  Recent Labs Lab 09/08/13 0105 09/09/13 0608  WBC 8.5 3.8*  NEUTROABS 7.2  --   HGB 10.6* 8.9*  HCT 32.7* 27.7*  MCV 99.1 98.6  PLT 236 209   Cardiac Enzymes:  Recent Labs Lab 09/08/13 0105  TROPONINI <0.30     Studies: No results found.  Scheduled Meds: . acyclovir  400 mg Oral BID  . cefTRIAXone (ROCEPHIN)  IV  1 g Intravenous Q24H  . dexamethasone  40 mg Oral Weekly  . gabapentin  100 mg Oral Daily  . guaiFENesin  600 mg Oral BID  . insulin aspart  0-9 Units Subcutaneous TID WC  . lisinopril  20 mg Oral Daily  . loratadine  10 mg Oral Daily  . OxyCODONE  10 mg Oral Q12H  . potassium chloride SA  20 mEq Oral BID  . [START ON 09/12/2013] predniSONE  20 mg Oral Weekly  . saccharomyces boulardii  250 mg Oral BID  . warfarin  2.5 mg Oral ONCE-1800  . Warfarin - Pharmacist Dosing Inpatient   Does not apply q1800   Continuous Infusions: . sodium chloride 50 mL/hr at 09/11/13 0430    Principal Problem:   Weakness generalized Active Problems:   MULTIPLE  MYELOMA   DIABETES MELLITUS, TYPE II   HYPERLIPIDEMIA   HYPERTENSION   LEG PAIN, RIGHT   Gait instability   DVT (deep venous thrombosis)   DNR no code (do not resuscitate)   Chronic steroid use   Chronic anticoagulation   Weakness   UTI (urinary tract infection)   Acute renal failure   Acute upper respiratory infections of unspecified site    Sheila Oats, MD  [facility] Pager [phone]. If 7PM-7AM, please contact night-coverage at [email or web address], password [facility] 09/11/2013, 6:00 PM  LOS: 3 days

## 2013-09-12 ENCOUNTER — Other Ambulatory Visit: Payer: Medicare Other

## 2013-09-12 ENCOUNTER — Ambulatory Visit: Payer: Medicare Other

## 2013-09-12 ENCOUNTER — Telehealth: Payer: Self-pay | Admitting: Oncology

## 2013-09-12 DIAGNOSIS — M542 Cervicalgia: Secondary | ICD-10-CM

## 2013-09-12 LAB — CLOSTRIDIUM DIFFICILE BY PCR: Toxigenic C. Difficile by PCR: NEGATIVE

## 2013-09-12 LAB — GLUCOSE, CAPILLARY
GLUCOSE-CAPILLARY: 127 mg/dL — AB (ref 70–99)
Glucose-Capillary: 93 mg/dL (ref 70–99)

## 2013-09-12 LAB — PROTIME-INR
INR: 2.18 — AB (ref 0.00–1.49)
PROTHROMBIN TIME: 23.6 s — AB (ref 11.6–15.2)

## 2013-09-12 MED ORDER — WARFARIN SODIUM 5 MG PO TABS
5.0000 mg | ORAL_TABLET | Freq: Once | ORAL | Status: DC
  Filled 2013-09-12: qty 1

## 2013-09-12 MED ORDER — LOPERAMIDE HCL 2 MG PO TABS: 2.0000 mg | ORAL_TABLET | Freq: Four times a day (QID) | ORAL | Status: DC | PRN

## 2013-09-12 MED ORDER — CEFUROXIME AXETIL 500 MG PO TABS: 500.0000 mg | ORAL_TABLET | Freq: Two times a day (BID) | ORAL | Status: DC

## 2013-09-12 MED ORDER — GUAIFENESIN ER 600 MG PO TB12: 600.0000 mg | ORAL_TABLET | Freq: Two times a day (BID) | ORAL | Status: DC

## 2013-09-12 MED ORDER — SACCHAROMYCES BOULARDII 250 MG PO CAPS: 250.0000 mg | ORAL_CAPSULE | Freq: Two times a day (BID) | ORAL | Status: DC

## 2013-09-12 MED ORDER — LORATADINE 10 MG PO TABS: 10.0000 mg | ORAL_TABLET | Freq: Every day | ORAL | Status: DC

## 2013-09-12 NOTE — Telephone Encounter (Signed)
Called and spoke to Bryan at [facility], [place], RN, to get report on [name]'s condition.  Per Ginger, patient is being tested for C Diff and is on contact precautions until results are back.  She did not test positive for the flu.    Notified Dr. Sondra Come, who said to hold off on radiation treatment for a few more days.  Notified Heather on [place].

## 2013-09-12 NOTE — Discharge Summary (Addendum)
Physician Discharge Summary  [name] EHO:[id number] DOB: [birth year] DOA: 09/08/2013  PCP: Lindsay Stain, MD  Admit date: 09/08/2013 Discharge date: 09/12/2013  Time spent: >20mnutes  Recommendations for Outpatient Follow-up:  Follow-up Information   Follow up with Lindsay Stain MD. (in [facility] call for appt upon discharge)    Specialty:  Family Medicine   Contact information:   [place] [phone]      Follow up with Lindsay Kempf, MD. (as scheduled on Thurs.)    Specialty:  Oncology   Contact information:   [phone]      Follow up with Lindsay Promise MD. (as scheduled)    Specialty:  Radiation Oncology   Contact information:   [phone]       Discharge Diagnoses:  Principal Problem:   Weakness generalized Active Problems:   MULTIPLE  MYELOMA   DIABETES MELLITUS, TYPE II   HYPERLIPIDEMIA   HYPERTENSION   LEG PAIN, RIGHT   Gait instability   DVT (deep venous thrombosis)   DNR no code (do not resuscitate)   Chronic steroid use   Chronic anticoagulation   Weakness   UTI (urinary tract infection)   Acute renal failure   Acute upper respiratory infections of unspecified site Toxic encephalopathy  Discharge Condition: Improved/stable  Diet recommendation: Modified carbohydrate  Filed Weights   09/08/13 0050  Weight: 59.421 kg (131 lb)    History of present illness:  AAiryonna Franklynis an 78y.o. 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  She has a mild nonproductive cough, but no fever or chills. She had no shortness of breath or chest pain. She hadn't been eating as much. Evaluation in the ER included a CXR which was negative, CT of head showing no acute process, a normal WBC and normal renal fx tests. She was found to have a UTI, and was a bit hypotensive in the ER as well. Hospitalist was asked to admit her for weakness, UTI, volume depletion and altered mental status.    Hospital Course:  Diarrhea  -pt developed diarrhea hospital while on antibiotics and C. difficile was ordered and came back negative -She was placed on Florastor and on followup her diarrhea slowed down. Altered mental status/Toxic encephalopathy and weakness  CT Head is negative.  -Was likely due to UTI  -She was treated with IV antibiotics and hydrated with IV fluids. -On followup her mentation was at baseline, she is alert and lucid and she is to follow up with her PCP on discharge -Seen by PT and no skilled needs identified  Klebsiella UTI  -An admission urinalysis was positive for infection, urine was sent for culture and she was started on empiric antibiotics -Patient improved clinically-she has remained afebrile hemodynamically stable and her mentation has improved to baseline -Her urine cultures grew Klebsiella sensitive to Rocephin  -she will be discharged on oral abx to complete the treatment course.  Acute Renal Failure  Secondary to a combination of UTI, dehydration  -Resolved with  hydration and treatment of UTI  -Her last creatinine prior to discharge 0.97 Cough with Upper Respiratory viral syndrome.  Supportive care - mucolytics and antihistamines -Influenza PCR was done and came back negative  DM  Hgb A1C was 6.7 in 04/2013. Will recheck  -She is to continue her outpatient medications and follow up with her PCP Continue Regular diet as patient is not eating well.  HTN/Hypotension  -BP now stable, hypotension resolved. She is to  continue outpatient meds and follow up with her PCP Chronic pain  Continue home dose chronic pain medications.  Multiple Myeloma  -Hypercalcemia resolved with hydration  Management per Onc>>follow up with [name] and [name].   Procedures:  none  Consultations:  none  Discharge Exam: Filed Vitals:   09/12/13 1447  BP: 157/84  Pulse: 66  Temp: 97.5 F (36.4 C)  Resp: 20   Exam:  General: Alert and appropriate, in NAD  HEENT: PERR, EOMI, Anicteic Sclera, MM appear slightly dry.  Neck: Supple, no JVD, no masses  Cardiovascular: RRR, S1 S2 auscultated, no rubs, murmurs or gallops.  Respiratory: Decreased breath sounds at the bases, no crackles or wheezes  Abdomen: Soft, NT., nondistended, + bowel sounds  Extremities: Trace edema no cyanosis.  Neuro: AAOx3, cranial nerves grossly intact. Strength 5/5 in upper and lower extremities    Discharge Instructions  Discharge Orders   Future Appointments Provider Department Dept Phone   09/15/2013 5:30 PM [place] [phone]   09/19/2013 2:00 PM Lindsay Bears, MD [place] [phone]   09/19/2013 3:20 PM [facility] [place] [phone]   09/20/2013 5:30 PM [place] [phone]   09/21/2013 5:30 PM [place] [phone]   09/30/2013 2:45 PM [place] [phone]   10/13/2013 3:15 PM Lindsay Ghent, MD [facility]   Future Orders Complete By Expires   Diet Carb Modified  As directed    Increase activity slowly  As directed        Medication List    STOP taking these medications       ibuprofen 200 MG tablet  Commonly known as:  ADVIL,MOTRIN     magnesium oxide 400 MG tablet   Commonly known as:  MAG-OX      TAKE these medications       acyclovir 400 MG tablet  Commonly known as:  ZOVIRAX  Take 400 mg by mouth 2 (two) times daily.     ALPRAZolam 0.5 MG tablet  Commonly known as:  XANAX  Take 0.5 mg by mouth 2 (two) times daily as needed for anxiety.     Calcium Carbonate-Vitamin D 600-400 MG-UNIT per tablet  Take 1 tablet by mouth 3 (three) times daily with meals.     cefUROXime 500 MG tablet  Commonly known as:  CEFTIN  Take 1 tablet (500 mg total) by mouth 2 (two) times daily with a meal.     dexamethasone 4 MG tablet  Commonly known as:  DECADRON  Take 40 mg by mouth once a week.     gabapentin 100 MG capsule  Commonly known as:  NEURONTIN  Take 100 mg by mouth daily.     glipiZIDE 2.5 MG 24 hr tablet  Commonly known as:  GLUCOTROL XL  Take 1 tablet (2.5 mg total) by  mouth daily.     glucose blood test strip  Commonly known as:  ONE TOUCH ULTRA TEST  Use as instructed     guaiFENesin 600 MG 12 hr tablet  Commonly known as:  MUCINEX  Take 1 tablet (600 mg total) by mouth 2 (two) times daily.     lisinopril-hydrochlorothiazide 20-12.5 MG per tablet  Commonly known as:  PRINZIDE,ZESTORETIC  TAKE ONE-HALF TABLET BY MOUTH EVERY DAY     loratadine 10 MG tablet  Commonly known as:  CLARITIN  Take 1 tablet (10 mg total) by mouth daily.     onetouch ultrasoft lancets  Test blood sugar once daily or as directed.     oxyCODONE 5 MG immediate release tablet  Commonly known as:  Oxy IR/ROXICODONE  2.5-5 mg every 6 (six) hours as needed for severe pain or breakthrough pain. Take 1 1/2 tabs by mouth every 6 hours as needed for pain     OxyCODONE 10 mg T12a 12 hr tablet  Commonly known as:  OXYCONTIN  Take 1 tablet (10 mg total) by mouth every 12 (twelve) hours.     pomalidomide 4 MG capsule  Commonly known as:  POMALYST  - Take 4 mg by mouth daily. Take with water on days 1-21. Repeat every 28 days.   Last survey   06/06/13.    Authorization  #   [id number]   08/19/13.  - Adult female not of reproductive potential.     potassium chloride SA 20 MEQ tablet  Commonly known as:  K-DUR,KLOR-CON  Take 1 tablet (20 mEq total) by mouth daily as needed.     predniSONE 20 MG tablet  Commonly known as:  DELTASONE  Take 20 mg by mouth once a week.     prochlorperazine 10 MG tablet  Commonly known as:  COMPAZINE  Take 1 tablet (10 mg total) by mouth every 6 (six) hours as needed.     saccharomyces boulardii 250 MG capsule  Commonly known as:  FLORASTOR  Take 1 capsule (250 mg total) by mouth 2 (two) times daily.     temazepam 30 MG capsule  Commonly known as:  RESTORIL  Take 30 mg by mouth at bedtime as needed.     warfarin 5 MG tablet  Commonly known as:  COUMADIN  Take 2.5-5 mg by mouth daily at 6 PM. Take one by mouth daily on Monday & Wednesday. Take 1/2 tablet on all other days or as directed       No Known Allergies    The results of significant diagnostics from this hospitalization (including imaging, microbiology, ancillary and laboratory) are listed below for reference.    Significant Diagnostic Studies: Dg Chest 2 View  09/08/2013   CLINICAL DATA:  Altered mental status  EXAM: CHEST  2 VIEW  COMPARISON:  Prior radiograph from 09/07/2013  FINDINGS: Cardiac and mediastinal silhouettes are unchanged, and remain within normal limits.  Lungs are mildly hypoinflated. Diffuse interstitial thickening with pulmonary vascular congestion is again seen, suggestive of mild pulmonary interstitial edema. Minimal blunting of the left costophrenic angle is suggestive of a small left pleural effusion. No definite focal infiltrate identified. No pneumothorax.  No acute osseous abnormality.  IMPRESSION: Persistent diffuse interstitial prominence, suggestive of mild pulmonary interstitial edema. Findings are not significantly changed relative to prior study.  Probable small left pleural effusion.   Electronically Signed   By:  Jeannine Boga M.D.   On: 09/08/2013 01:35   Dg Chest 2 View  09/07/2013   CLINICAL DATA:  Cough and weakness.  EXAM: CHEST  2 VIEW  COMPARISON:  August 31, 2012 PA and lateral chest  FINDINGS: The lungs remain hypoinflated. The interstitial markings are slightly more conspicuous bilaterally today than on the previous study. The hilar structures are less distinct. The cardiac silhouette is top-normal in size. No definite pleural effusion is demonstrated. There is no evidence of a pneumothorax. Again demonstrated are wedge compressions of upper and mid thoracic vertebral bodies which do not appear significantly changed.  IMPRESSION: Mildly increased interstitial markings bilaterally may reflect interstitial edema of cardiac or noncardiac cause. There is no discrete alveolar pneumonia.   Electronically Signed   By: David  Martinique   On: 09/07/2013 15:45   Ct Head Wo Contrast  09/08/2013   CLINICAL DATA:  Fall.  EXAM: CT HEAD WITHOUT CONTRAST  TECHNIQUE: Contiguous axial images were obtained from the base of the skull through the vertex without intravenous contrast.  COMPARISON:  05/16/2011  FINDINGS: Skull and Sinuses:Stable appearance of multiple small lucent areas within the calvarium. The partly imaged maxillary sinuses have bilateral effusions.  Orbits: No acute abnormality.  Brain: No evidence of acute abnormality, such as acute infarction, hemorrhage, hydrocephalus, or mass lesion/mass effect. Generalized atrophy, stable from 2012.  IMPRESSION: No evidence of acute intracranial injury.  Maxillary sinus effusions.   Electronically Signed   By: Jorje Guild M.D.   On: 09/08/2013 02:43    Microbiology: Recent Results (from the past 240 hour(s))  URINE CULTURE     Status: None   Collection Time    09/08/13  1:38 AM      Result Value Range Status   Specimen Description URINE, CLEAN CATCH   Final   Special Requests NONE   Final   Culture  Setup Time     Final   Value: 09/08/2013 09:28      Performed at [facility]     Final   Value: >=100,000 COLONIES/ML     Performed at [facility]   Culture     Final   Value: KLEBSIELLA PNEUMONIAE     Performed at [facility]   Report Status 09/10/2013 FINAL   Final   Organism ID, Bacteria KLEBSIELLA PNEUMONIAE   Final  CLOSTRIDIUM DIFFICILE BY PCR     Status: None   Collection Time    09/12/13 12:20 PM      Result Value Range Status   C difficile by pcr NEGATIVE  NEGATIVE Final     Labs: Basic Metabolic Panel:  Recent Labs Lab 09/08/13 0105 09/09/13 0608  NA 139 142  K 4.1 4.1  CL 98 110  CO2 26 20  GLUCOSE 122* 153*  BUN 26* 15  CREATININE 1.49* 0.97  CALCIUM 9.3 8.3*   Liver Function Tests:  Recent Labs Lab 09/08/13 0105  AST 21  ALT 15  ALKPHOS 44  BILITOT 0.4  PROT 6.3  ALBUMIN 3.0*   No results found for this basename: LIPASE, AMYLASE,  in the last 168 hours No results found for this basename: AMMONIA,  in the last 168 hours CBC:  Recent Labs Lab 09/08/13 0105 09/09/13 0608  WBC 8.5 3.8*  NEUTROABS 7.2  --   HGB 10.6* 8.9*  HCT 32.7* 27.7*  MCV 99.1 98.6  PLT 236 209   Cardiac Enzymes:  Recent Labs Lab 09/08/13 0105  TROPONINI <0.30   BNP: BNP (last 3 results) No results found for this  basename: PROBNP,  in the last 8760 hours CBG:  Recent Labs Lab 09/11/13 1203 09/11/13 1726 09/11/13 2246 09/12/13 0741 09/12/13 1404  GLUCAP 124* 127* 123* 127* 93       Signed:  Adisen Bennion C  [facility] 09/12/2013, 2:58 PM

## 2013-09-12 NOTE — Evaluation (Signed)
Occupational Therapy Evaluation Patient Details Name: [name] MRN: [id number] DOB: March 30, 1936 Today's Date: 09/12/2013 Time: 1093-2355 OT Time Calculation (min): 35 min  OT Assessment / Plan / Recommendation History of present illness Pt adm with AMS and weakness likely due to UTI.   Clinical Impression   Pt is functioning overall at a supervision level.  Likely near her baseline.  No further OT needs, recommend home with supervision of her family.    OT Assessment  Patient does not need any further OT services    Follow Up Recommendations  No OT follow up;Supervision/Assistance - 24 hour    Barriers to Discharge      Equipment Recommendations  None recommended by OT    Recommendations for Other Services    Frequency       Precautions / Restrictions Precautions Precautions: Fall Restrictions Weight Bearing Restrictions: No   Pertinent Vitals/Pain See flow sheet, no pain    ADL  Eating/Feeding: Independent Where Assessed - Eating/Feeding: Edge of bed Grooming: Wash/dry hands;Wash/dry face;Supervision/safety Where Assessed - Grooming: Unsupported standing Upper Body Bathing: Supervision/safety Where Assessed - Upper Body Bathing: Unsupported sitting Lower Body Bathing: Supervision/safety Where Assessed - Lower Body Bathing: Unsupported sitting;Supported sit to stand Upper Body Dressing: Supervision/safety Where Assessed - Upper Body Dressing: Unsupported sitting Lower Body Dressing: Supervision/safety Where Assessed - Lower Body Dressing: Unsupported sitting;Supported sit to stand Toilet Transfer: Supervision/safety Toilet Transfer Method: Sit to Loss adjuster, chartered: Regular height toilet Toileting - Clothing Manipulation and Hygiene: Supervision/safety Where Assessed - Best boy and Hygiene: Standing Equipment Used: Gait belt;Rolling walker Transfers/Ambulation Related to ADLs: supervision with RW ADL Comments: Pt overall at  a supervision level.    OT Diagnosis:    OT Problem List:   OT Treatment Interventions:     OT Goals(Current goals can be found in the care plan section) Acute Rehab OT Goals Patient Stated Goal: home with children  Visit Information  Last OT Received On: 09/12/13 Assistance Needed: +1 History of Present Illness: Pt adm with AMS and weakness likely due to UTI.       Prior [place] expects to be discharged to:: Private residence Living Arrangements: Children Available Help at Discharge: Family;Available 24 hours/day Type of Home: House Home Access: Ramped entrance Home Layout: One level Home Equipment: Walker - 2 wheels;Shower seat;Bedside commode Prior Function Level of Independence: Independent with assistive device(s) Communication Communication: No difficulties Dominant Hand: Right         Vision/Perception Vision - History Patient Visual Report: No change from baseline   Cognition  Cognition Arousal/Alertness: Awake/alert Behavior During Therapy: WFL for tasks assessed/performed Overall Cognitive Status: History of cognitive impairments - at baseline    Extremity/Trunk Assessment Upper Extremity Assessment Upper Extremity Assessment: Overall WFL for tasks assessed Lower Extremity Assessment Lower Extremity Assessment: Overall WFL for tasks assessed Cervical / Trunk Assessment Cervical / Trunk Assessment: Normal     Mobility Bed Mobility Overal bed mobility: Independent Transfers Overall transfer level: Needs assistance Equipment used: Rolling walker (2 wheeled) Transfers: Sit to/from Stand Sit to Stand: Modified independent (Device/Increase time)     Exercise     Balance Balance Overall balance assessment: History of Falls   End of Session OT - End of Session Activity Tolerance: Patient tolerated treatment well Patient left: in chair;with call bell/phone within reach;with chair alarm set Nurse  Communication: Mobility status (02 sats)  GO     Malka So 09/12/2013, 9:58 AM [phone]

## 2013-09-12 NOTE — Progress Notes (Addendum)
ANTICOAGULATION CONSULT NOTE - Follow Up Consult  Pharmacy Consult for Coumadin Indication: history of bilateral and recurrent DVTs  No Known Allergies  Patient Measurements: Height: 4\' 10"  (147.3 cm) Weight: 131 lb (59.421 kg) IBW/kg (Calculated) : 40.9  Vital Signs: Temp src: Oral (01/12 0615) BP: 145/74 mmHg (01/12 0649) Pulse Rate: 55 (01/12 0649)  Labs:  Recent Labs  09/10/13 0343 09/11/13 0410 09/12/13 0322  LABPROT 33.5* 28.5* 23.6*  INR 3.46* 2.80* 2.18*   Estimated Creatinine Clearance: 38.2 ml/min (by C-G formula based on Cr of 0.97).  Medications:  Scheduled:  . acyclovir  400 mg Oral BID  . cefTRIAXone (ROCEPHIN)  IV  1 g Intravenous Q24H  . dexamethasone  40 mg Oral Weekly  . gabapentin  100 mg Oral Daily  . guaiFENesin  600 mg Oral BID  . insulin aspart  0-9 Units Subcutaneous TID WC  . lisinopril  20 mg Oral Daily  . loratadine  10 mg Oral Daily  . OxyCODONE  10 mg Oral Q12H  . potassium chloride SA  20 mEq Oral BID  . predniSONE  20 mg Oral Weekly  . saccharomyces boulardii  250 mg Oral BID  . Warfarin - Pharmacist Dosing Inpatient   Does not apply q1800   Assessment: 78 yo F on Coumadin PTA for hx of recurrent DVTs.  INR now back within goal range after doses held x 2.  Will resume home dose.    Goal of Therapy:  INR 2-3 Monitor platelets by anticoagulation protocol: Yes   Plan:  Coumadin 5mg  PO tonight.   Will follow-up with INR in AM.  Rober Minion, PharmD., MS Clinical Pharmacist Pager:  [phone] Thank you for allowing pharmacy to be part of this patients care team. 09/12/2013 11:14 AM   Addendum: Spoke with Dr. Julien Nordmann (onc) regarding the Acyclovir.  Since Velcade has been stopped he felt this should be stopped as well therefore, I will discontinue.  Rober Minion, PharmD., MS Clinical Pharmacist Pager:  [phone] Thank you for allowing pharmacy to be part of this patients care team.

## 2013-09-12 NOTE — Progress Notes (Signed)
Pt discharged to home with daughters. Pt. Is alert and oriented. Skin is intact and IV has been removed. Pt is hemodynamically stable. AVS reviewed with pt. Capable of re verbalizing medication regimen. Discharge plan appropriate and in place.

## 2013-09-12 NOTE — Telephone Encounter (Signed)
Noted. Thanks.

## 2013-09-13 ENCOUNTER — Ambulatory Visit: Payer: Medicare Other

## 2013-09-13 NOTE — Progress Notes (Signed)
Patient discharged from hospital.Dr.Kinard stopped treatments for 09/13/13 and 09/14/13 Called patient and informed to return on Thursday 09/15/13 at 5:15 pm.A gentleman was on the line with patient, I told him to inform patient's daughter.

## 2013-09-14 ENCOUNTER — Telehealth: Payer: Self-pay | Admitting: *Deleted

## 2013-09-14 NOTE — Telephone Encounter (Signed)
Pt's daughter called wanting to know when pt should r/s pomalyst.  Per Dr Vista Mink, pt may go ahead and r/s.  [name] also stated that pt has been very mean. She wanted to know if it was r/t her multiple myeloma.  Per dr Vista Mink, it would not be r/t, will assess her at f/u appt on 1/19.  SLJ

## 2013-09-15 ENCOUNTER — Telehealth: Payer: Self-pay | Admitting: Pharmacist

## 2013-09-15 ENCOUNTER — Ambulatory Visit
Admit: 2013-09-15 | Discharge: 2013-09-15 | Disposition: A | Discharge status: Home / Self Care | Payer: Medicare Other | Attending: Radiation Oncology | Admitting: Radiation Oncology

## 2013-09-16 ENCOUNTER — Ambulatory Visit
Admission: RE | Admit: 2013-09-16 | Discharge: 2013-09-16 | Disposition: A | Discharge status: Home / Self Care | Payer: Medicare Other | Source: Ambulatory Visit | Attending: Radiation Oncology | Admitting: Radiation Oncology

## 2013-09-19 ENCOUNTER — Telehealth: Payer: Self-pay | Admitting: Internal Medicine

## 2013-09-19 ENCOUNTER — Ambulatory Visit (HOSPITAL_BASED_OUTPATIENT_CLINIC_OR_DEPARTMENT_OTHER): Payer: Medicare Other | Admitting: Internal Medicine

## 2013-09-19 ENCOUNTER — Encounter: Payer: Self-pay | Admitting: Internal Medicine

## 2013-09-19 ENCOUNTER — Ambulatory Visit
Admit: 2013-09-19 | Discharge: 2013-09-19 | Disposition: A | Discharge status: Home / Self Care | Payer: Medicare Other | Attending: Radiation Oncology | Admitting: Radiation Oncology

## 2013-09-19 ENCOUNTER — Other Ambulatory Visit (HOSPITAL_BASED_OUTPATIENT_CLINIC_OR_DEPARTMENT_OTHER): Payer: Medicare Other

## 2013-09-19 ENCOUNTER — Ambulatory Visit: Payer: Medicare Other | Admitting: Pharmacist

## 2013-09-19 VITALS — BP 125/79 | HR 96 | Temp 99.0°F | Resp 17 | Ht <= 58 in | Wt 125.7 lb

## 2013-09-19 DIAGNOSIS — C9000 Multiple myeloma not having achieved remission: Secondary | ICD-10-CM

## 2013-09-19 DIAGNOSIS — I82401 Acute embolism and thrombosis of unspecified deep veins of right lower extremity: Secondary | ICD-10-CM

## 2013-09-19 DIAGNOSIS — Z7901 Long term (current) use of anticoagulants: Secondary | ICD-10-CM

## 2013-09-19 DIAGNOSIS — I82409 Acute embolism and thrombosis of unspecified deep veins of unspecified lower extremity: Secondary | ICD-10-CM

## 2013-09-19 LAB — CBC WITH DIFFERENTIAL/PLATELET
BASO%: 1.1 % (ref 0.0–2.0)
BASOS ABS: 0 10*3/uL (ref 0.0–0.1)
EOS ABS: 0.1 10*3/uL (ref 0.0–0.5)
EOS%: 3.7 % (ref 0.0–7.0)
HCT: 34.8 % (ref 34.8–46.6)
HEMOGLOBIN: 11.3 g/dL — AB (ref 11.6–15.9)
LYMPH%: 19.2 % (ref 14.0–49.7)
MCH: 32.1 pg (ref 25.1–34.0)
MCHC: 32.5 g/dL (ref 31.5–36.0)
MCV: 98.6 fL (ref 79.5–101.0)
MONO#: 0.3 10*3/uL (ref 0.1–0.9)
MONO%: 7.1 % (ref 0.0–14.0)
NEUT#: 2.5 10*3/uL (ref 1.5–6.5)
NEUT%: 68.9 % (ref 38.4–76.8)
Platelets: 316 10*3/uL (ref 145–400)
RBC: 3.53 10*6/uL — AB (ref 3.70–5.45)
RDW: 17.1 % — ABNORMAL HIGH (ref 11.2–14.5)
WBC: 3.7 10*3/uL — ABNORMAL LOW (ref 3.9–10.3)
lymph#: 0.7 10*3/uL — ABNORMAL LOW (ref 0.9–3.3)

## 2013-09-19 LAB — COMPREHENSIVE METABOLIC PANEL (CC13)
ALT: 17 U/L (ref 0–55)
AST: 14 U/L (ref 5–34)
Albumin: 3.3 g/dL — ABNORMAL LOW (ref 3.5–5.0)
Alkaline Phosphatase: 48 U/L (ref 40–150)
Anion Gap: 13 mEq/L — ABNORMAL HIGH (ref 3–11)
BILIRUBIN TOTAL: 0.45 mg/dL (ref 0.20–1.20)
BUN: 15 mg/dL (ref 7.0–26.0)
CO2: 28 mEq/L (ref 22–29)
CREATININE: 1 mg/dL (ref 0.6–1.1)
Calcium: 10.2 mg/dL (ref 8.4–10.4)
Chloride: 102 mEq/L (ref 98–109)
GLUCOSE: 174 mg/dL — AB (ref 70–140)
Potassium: 3.5 mEq/L (ref 3.5–5.1)
Sodium: 143 mEq/L (ref 136–145)
Total Protein: 6.6 g/dL (ref 6.4–8.3)

## 2013-09-19 LAB — LACTATE DEHYDROGENASE (CC13): LDH: 295 U/L — AB (ref 125–245)

## 2013-09-19 LAB — POCT INR: INR: 2.5

## 2013-09-19 LAB — PROTIME-INR
INR: 2.5 (ref 2.00–3.50)
PROTIME: 30 s — AB (ref 10.6–13.4)

## 2013-09-19 NOTE — Progress Notes (Signed)
INR within goal today. No concerns regarding anticoagulation to report. No changes in diet. New cycle of Pomalyst started on 09/13/13 Recent hospitalization 1/8-1/12 for weakness, UTI, volume depletion and altered mental status. Pt recently completed a 7 day course of Cefuroxime 500mg  po BID on 09/18/13. Pt was discharged on her usual coumadin dose on 09/12/13. Will continue coumadin 2.5mg  daily except 5mg  on Mon&Wed.   Recheck PT/INR in 10 days on 09/29/13: lab at 3:15pm, coumadin clinic at 3:30pm.

## 2013-09-19 NOTE — Telephone Encounter (Signed)
Gave pt appt for lab and MD for February 2015

## 2013-09-19 NOTE — Patient Instructions (Signed)
Continue coumadin 2.5mg  daily except 5mg  on Mon&Wed.   Recheck PT/INR in 10 days on 09/29/13: lab at 3:15pm, coumadin clinic at 3:30pm.

## 2013-09-19 NOTE — Patient Instructions (Signed)
Follow up visit in one month with repeat lab or

## 2013-09-19 NOTE — Progress Notes (Signed)
[place] [phone]   [phone]  OFFICE PROGRESS NOTE  Lindsay Stain, MD [place] [address]  DIAGNOSIS:  1. multiple myeloma diagnosed in September 2009,  2. history of bilateral lower extremity deep vein thrombosis diagnosed in November 2009  3. acute on chronic deep vein thrombosis in the left lower extremity diagnosed in October 2010.   PRIOR THERAPY:  1. status post palliative radiotherapy to the right femur and is she'll tuberosity, first was done in December 2011 and the second treatment was completed Jan 08 2011, and the third treatment to the right distal femur completed on 04/30/2011  2. status post palliative radiotherapy to the left proximal femur completed 02/28/2011.  3. Revlimid 25 mg by mouth daily for 21 days every 4 weeks in addition to oral Decadron at 40 mg by mouth on a weekly basis status post 40 cycles.  4. weekly subcutaneous Velcade 1.3 mg/M2 status post 60 cycles, discontinued today secondary to mild disease progression.   CURRENT THERAPY:  1. Pomalyst 4 mg by mouth daily for 21 days every 4 weeks, the patient started the first dose on 12/22/2012 , in addition to Decadron 40 mg weekly. Status post 7 cycles. She started cycle #8 on 08/22/2013. 2. Coumadin 5 mg on Mondays and Thursdays and 2.5 mg on all other days.  3. Zometa 4 mg IV given every 2 months.    INTERVAL HISTORY: [name] 78 y.o. female returns to the clinic today for followup visit accompanied by her  2 daughters. The patient was recently admitted to [facility] for treatment of urinary tract infection and increasing fatigue. She is feeling better today and she resumed her treatment with Pomalyst. She completed a course of palliative radiotherapy under the care of Dr. Sondra Come recently. She has no significant fever or chills today. The patient continues to complain of increasing fatigue and low back pain and peripheral  neuropathy in the lower extremities. She is tolerating her treatment with Pomalyst fairly well. She denied having any significant nausea or vomiting. The patient denied having any significant weight loss or night sweats. She has no chest pain, shortness of breath, cough or hemoptysis. S  MEDICAL HISTORY: Past Medical History  Diagnosis Date  . Blood transfusion   . Depression     Mild  . History of chicken pox   . Hyperlipidemia   . Hypertension   . Degenerative joint disease   . Phlebitis   . Multiple myeloma     per Dr. Julien Nordmann, s/p palliative radiation for leg pain 2011 per Dr. Sondra Come  . Deep vein thrombosis of bilateral lower extremities   . Family history of anesthesia complication     DAUGHTER CPR AFTER  . Cardiomyopathy   . Diabetes mellitus     Steroid related  . UTI (urinary tract infection) 09/08/2013    ALLERGIES:  has No Known Allergies.  MEDICATIONS:  Current Outpatient Prescriptions  Medication Sig Dispense Refill  . acyclovir (ZOVIRAX) 400 MG tablet Take 400 mg by mouth 2 (two) times daily.      Marland Kitchen ALPRAZolam (XANAX) 0.5 MG tablet Take 0.5 mg by mouth 2 (two) times daily as needed for anxiety.      . Calcium Carbonate-Vitamin D 600-400 MG-UNIT per tablet Take 1 tablet by mouth 3 (three) times daily with meals.        Marland Kitchen dexamethasone (DECADRON) 4 MG tablet Take 40 mg by mouth once  a week.      . gabapentin (NEURONTIN) 100 MG capsule Take 100 mg by mouth daily.      Marland Kitchen glipiZIDE (GLUCOTROL XL) 2.5 MG 24 hr tablet Take 1 tablet (2.5 mg total) by mouth daily.  90 tablet  3  . glucose blood (ONE TOUCH ULTRA TEST) test strip Use as instructed  25 each  6  . guaiFENesin (MUCINEX) 600 MG 12 hr tablet Take 1 tablet (600 mg total) by mouth 2 (two) times daily.  30 tablet  0  . Lancets (ONETOUCH ULTRASOFT) lancets Test blood sugar once daily or as directed.  100 each  11  . lisinopril-hydrochlorothiazide (PRINZIDE,ZESTORETIC) 20-12.5 MG per tablet TAKE ONE-HALF TABLET BY MOUTH  EVERY DAY  45 tablet  1  . loratadine (CLARITIN) 10 MG tablet Take 1 tablet (10 mg total) by mouth daily.  30 tablet  0  . oxyCODONE (OXY IR/ROXICODONE) 5 MG immediate release tablet 2.5-5 mg every 6 (six) hours as needed for severe pain or breakthrough pain. Take 1 1/2 tabs by mouth every 6 hours as needed for pain      . OxyCODONE (OXYCONTIN) 10 mg T12A 12 hr tablet Take 1 tablet (10 mg total) by mouth every 12 (twelve) hours.  60 tablet  0  . pomalidomide (POMALYST) 4 MG capsule Take 4 mg by mouth daily. Take with water on days 1-21. Repeat every 28 days.   Last survey   06/06/13.   Authorization  #   [id number]   08/19/13. Adult female not of reproductive potential.      . saccharomyces boulardii (FLORASTOR) 250 MG capsule Take 1 capsule (250 mg total) by mouth 2 (two) times daily.  30 capsule  0  . temazepam (RESTORIL) 30 MG capsule Take 30 mg by mouth at bedtime as needed.      . warfarin (COUMADIN) 5 MG tablet Take 2.5-5 mg by mouth daily at 6 PM. Take one by mouth daily on Monday & Wednesday. Take 1/2 tablet on all other days or as directed      . loperamide (IMODIUM A-D) 2 MG tablet Take 1 tablet (2 mg total) by mouth 4 (four) times daily as needed for diarrhea or loose stools.      . potassium chloride SA (K-DUR,KLOR-CON) 20 MEQ tablet Take 1 tablet (20 mEq total) by mouth daily as needed.  10 tablet  0  . predniSONE (DELTASONE) 20 MG tablet Take 20 mg by mouth once a week.      . prochlorperazine (COMPAZINE) 10 MG tablet Take 1 tablet (10 mg total) by mouth every 6 (six) hours as needed.  30 tablet  1   No current facility-administered medications for this visit.   Facility-Administered Medications Ordered in Other Visits  Medication Dose Route Frequency Provider Last Rate Last Dose  . Zoledronic Acid (ZOMETA) 4 mg IVPB  4 mg Intravenous Once Curt Bears, MD        SURGICAL HISTORY:  Past Surgical History  Procedure Laterality Date  . Abdominal hysterectomy    . Ankle fracture  surgery Left     REVIEW OF SYSTEMS:  Constitutional: positive for fatigue Eyes: negative Ears, nose, mouth, throat, and face: negative Respiratory: negative Cardiovascular: negative Gastrointestinal: negative Genitourinary:negative Integument/breast: negative Hematologic/lymphatic: negative Musculoskeletal:positive for back pain Neurological: positive for paresthesia Behavioral/Psych: negative Endocrine: negative Allergic/Immunologic: negative   PHYSICAL EXAMINATION: General appearance: alert, cooperative, fatigued and no distress Head: Normocephalic, without obvious abnormality, atraumatic Neck: no adenopathy, no JVD, supple, symmetrical,  trachea midline and thyroid not enlarged, symmetric, no tenderness/mass/nodules Lymph nodes: Cervical, supraclavicular, and axillary nodes normal. Resp: clear to auscultation bilaterally Back: symmetric, no curvature. ROM normal. No CVA tenderness. Cardio: regular rate and rhythm, S1, S2 normal, no murmur, click, rub or gallop GI: soft, non-tender; bowel sounds normal; no masses,  no organomegaly Extremities: extremities normal, atraumatic, no cyanosis or edema Neurologic: Alert and oriented X 3, normal strength and tone. Normal symmetric reflexes. Normal coordination and gait  ECOG PERFORMANCE STATUS: 2 - Symptomatic, <50% confined to bed  Blood pressure 125/79, pulse 96, temperature 99 F (37.2 C), temperature source Oral, resp. rate 17, height $RemoveBe'4\' 10"'cuwaxygsk$  (1.473 m), weight 125 lb 11.2 oz (57.017 kg), SpO2 97.00%.  LABORATORY DATA: Lab Results  Component Value Date   WBC 3.7* 09/19/2013   HGB 11.3* 09/19/2013   HCT 34.8 09/19/2013   MCV 98.6 09/19/2013   PLT 316 09/19/2013      Chemistry      Component Value Date/Time   NA 143 09/19/2013 1338   NA 142 09/09/2013 0608   K 3.5 09/19/2013 1338   K 4.1 09/09/2013 0608   CL 110 09/09/2013 0608   CL 107 02/14/2013 1311   CO2 28 09/19/2013 1338   CO2 20 09/09/2013 0608   BUN 15.0 09/19/2013 1338   BUN  15 09/09/2013 0608   CREATININE 1.0 09/19/2013 1338   CREATININE 0.97 09/09/2013 0608      Component Value Date/Time   CALCIUM 10.2 09/19/2013 1338   CALCIUM 8.3* 09/09/2013 0608   CALCIUM  Value: 5.7 CRITICAL RESULT CALLED TO, READ BACK BY AND VERIFIED WITH: [place] [id number] @ [address] [place] (NOTE)  Amended report. Result repeated and verified. CORRECTED ON 10/14 AT 1429: PREVIOUSLY REPORTED AS Result repeated and verified.* 06/13/2008 1605   ALKPHOS 48 09/19/2013 1338   ALKPHOS 44 09/08/2013 0105   AST 14 09/19/2013 1338   AST 21 09/08/2013 0105   ALT 17 09/19/2013 1338   ALT 15 09/08/2013 0105   BILITOT 0.45 09/19/2013 1338   BILITOT 0.4 09/08/2013 0105       RADIOGRAPHIC STUDIES: No results found.  ASSESSMENT AND PLAN: This is a very pleasant 78 years old African American female with multiple myeloma currently treatment was Pomalyst 4 mg by mouth daily for 21 days every 4 weeks status post 8 cycles. The patient is tolerating her treatment fairly well.  I recommended for her to continue on Pomalyst with the same dose.  Her myeloma panel is a still pending today. She will also continue on Zometa every 2 months as scheduled. She would come back for followup visit in one month for reevaluation with repeat CBC, comprehensive metabolic panel and LDH. The patient voices understanding of current disease status and treatment options and is in agreement with the current care plan.  All questions were answered. The patient knows to call the clinic with any problems, questions or concerns. We can certainly see the patient much sooner if necessary.

## 2013-09-20 ENCOUNTER — Ambulatory Visit: Payer: Medicare Other

## 2013-09-20 ENCOUNTER — Ambulatory Visit
Admission: RE | Admit: 2013-09-20 | Discharge: 2013-09-20 | Disposition: A | Discharge status: Home / Self Care | Payer: Medicare Other | Source: Ambulatory Visit | Attending: Radiation Oncology | Admitting: Radiation Oncology

## 2013-09-20 ENCOUNTER — Encounter: Payer: Self-pay | Admitting: Radiation Oncology

## 2013-09-20 VITALS — BP 121/69 | HR 83 | Resp 16 | Wt 125.0 lb

## 2013-09-20 DIAGNOSIS — C9000 Multiple myeloma not having achieved remission: Secondary | ICD-10-CM

## 2013-09-20 LAB — KAPPA/LAMBDA LIGHT CHAINS
KAPPA LAMBDA RATIO: 0.06 — AB (ref 0.26–1.65)
Kappa free light chain: 0.97 mg/dL (ref 0.33–1.94)
LAMBDA FREE LGHT CHN: 16.9 mg/dL — AB (ref 0.57–2.63)

## 2013-09-20 LAB — IGG, IGA, IGM
IGA: 111 mg/dL (ref 69–380)
IgG (Immunoglobin G), Serum: 587 mg/dL — ABNORMAL LOW (ref 690–1700)
IgM, Serum: 157 mg/dL (ref 52–322)

## 2013-09-20 LAB — BETA 2 MICROGLOBULIN, SERUM: BETA 2 MICROGLOBULIN: 2.45 mg/L — AB (ref 1.01–1.73)

## 2013-09-20 NOTE — Progress Notes (Signed)
[place]     Rexene Edison, M.D. [place], [place] [address]               Blair Promise, M.D., Ph.D. Phone: [phone]      [place] A. Tammi Klippel, M.D. Fax: [phone]      Jodelle Gross, M.D., Ph.D.         Thea Silversmith, M.D.         Wyvonnia Lora, M.D Weekly Treatment Management Note  Name: [name]     MRN: [id number]        CSN: [id number] Date: 09/20/2013      DOB: 06-Nov-1935  CC: Lindsay Stain, MD         [name]    Status: Outpatient  Diagnosis: The encounter diagnosis was MULTIPLE  MYELOMA.  Current Dose: 20 Gy  Current Fraction: 10  Planned Dose: 20 Gy  Narrative: [name] was seen today for weekly treatment management. The chart was checked and CBCT  were reviewed. She is happy to complete her radiation therapy. She has had some improvement in her low back pain. She does complain of some pain in the pelvis region at this time.  Review of patient's allergies indicates no known allergies.  Current Outpatient Prescriptions  Medication Sig Dispense Refill  . acyclovir (ZOVIRAX) 400 MG tablet Take 400 mg by mouth 2 (two) times daily.      Marland Kitchen ALPRAZolam (XANAX) 0.5 MG tablet Take 0.5 mg by mouth 2 (two) times daily as needed for anxiety.      . Calcium Carbonate-Vitamin D 600-400 MG-UNIT per tablet Take 1 tablet by mouth 3 (three) times daily with meals.        Marland Kitchen dexamethasone (DECADRON) 4 MG tablet Take 40 mg by mouth once a week.      . gabapentin (NEURONTIN) 100 MG capsule Take 100 mg by mouth daily.      Marland Kitchen glipiZIDE (GLUCOTROL XL) 2.5 MG 24 hr tablet Take 1 tablet (2.5 mg total) by mouth daily.  90 tablet  3  . glucose blood (ONE TOUCH ULTRA TEST) test strip Use as instructed  25 each  6  . guaiFENesin (MUCINEX) 600 MG 12 hr tablet Take 1 tablet (600 mg total) by mouth 2 (two) times daily.  30 tablet  0  . Lancets (ONETOUCH ULTRASOFT) lancets Test blood sugar once daily or as directed.  100 each   11  . lisinopril-hydrochlorothiazide (PRINZIDE,ZESTORETIC) 20-12.5 MG per tablet TAKE ONE-HALF TABLET BY MOUTH EVERY DAY  45 tablet  1  . loperamide (IMODIUM A-D) 2 MG tablet Take 1 tablet (2 mg total) by mouth 4 (four) times daily as needed for diarrhea or loose stools.      Marland Kitchen loratadine (CLARITIN) 10 MG tablet Take 1 tablet (10 mg total) by mouth daily.  30 tablet  0  . oxyCODONE (OXY IR/ROXICODONE) 5 MG immediate release tablet 2.5-5 mg every 6 (six) hours as needed for severe pain or breakthrough pain. Take 1 1/2 tabs by mouth every 6 hours as needed for pain      . OxyCODONE (OXYCONTIN) 10 mg T12A 12 hr tablet Take 1 tablet (10 mg total) by mouth every 12 (twelve) hours.  60 tablet  0  . pomalidomide (POMALYST) 4 MG capsule Take 4 mg by mouth daily. Take with water on days 1-21. Repeat every 28 days.   Last survey   06/06/13.   Authorization  #  [id number]   08/19/13. Adult female not of reproductive potential.      . potassium chloride SA (K-DUR,KLOR-CON) 20 MEQ tablet Take 1 tablet (20 mEq total) by mouth daily as needed.  10 tablet  0  . predniSONE (DELTASONE) 20 MG tablet Take 20 mg by mouth once a week.      . prochlorperazine (COMPAZINE) 10 MG tablet Take 1 tablet (10 mg total) by mouth every 6 (six) hours as needed.  30 tablet  1  . saccharomyces boulardii (FLORASTOR) 250 MG capsule Take 1 capsule (250 mg total) by mouth 2 (two) times daily.  30 capsule  0  . temazepam (RESTORIL) 30 MG capsule Take 30 mg by mouth at bedtime as needed.      . warfarin (COUMADIN) 5 MG tablet Take 2.5-5 mg by mouth daily at 6 PM. Take one by mouth daily on Monday & Wednesday. Take 1/2 tablet on all other days or as directed       No current facility-administered medications for this encounter.   Facility-Administered Medications Ordered in Other Encounters  Medication Dose Route Frequency Provider Last Rate Last Dose  . Zoledronic Acid (ZOMETA) 4 mg IVPB  4 mg Intravenous Once Curt Bears, MD        Labs:  Lab Results  Component Value Date   WBC 3.7* 09/19/2013   HGB 11.3* 09/19/2013   HCT 34.8 09/19/2013   MCV 98.6 09/19/2013   PLT 316 09/19/2013   Lab Results  Component Value Date   CREATININE 1.0 09/19/2013   BUN 15.0 09/19/2013   NA 143 09/19/2013   K 3.5 09/19/2013   CL 110 09/09/2013   CO2 28 09/19/2013   Lab Results  Component Value Date   ALT 17 09/19/2013   AST 14 09/19/2013   BILITOT 0.45 09/19/2013    Physical Examination:  Filed Vitals:   09/20/13 1850  BP: 121/69  Pulse: 83  Resp: 16    Wt Readings from Last 3 Encounters:  09/20/13 125 lb (56.7 kg)  09/19/13 125 lb 11.2 oz (57.017 kg)  09/08/13 131 lb (59.421 kg)     Lungs - Normal respiratory effort, chest expands symmetrically. Lungs are clear to auscultation, no crackles or wheezes.  Heart has regular rhythm and rate  Abdomen is soft and non tender with normal bowel sounds  Assessment:  Patient tolerated treatments well  Plan: Routine followup in one month.

## 2013-09-20 NOTE — Progress Notes (Signed)
Patient reports bilateral hip pain 4 on a scale of 0-10. Reports taking oxycodone 5 mg at 1700. Already has follow up appointment scheduled with Dr. Sondra Come for 2/23 at 1420. Weight stable. Vitals stable. No edema of lower extremities noted. Reports occasional numbness and tingling of lower extremities. Reports that she is able to ambulate slowly for short distances.

## 2013-09-21 ENCOUNTER — Other Ambulatory Visit: Payer: Self-pay | Admitting: Internal Medicine

## 2013-09-21 ENCOUNTER — Ambulatory Visit: Payer: Medicare Other

## 2013-09-21 ENCOUNTER — Other Ambulatory Visit: Payer: Self-pay | Admitting: *Deleted

## 2013-09-21 NOTE — Telephone Encounter (Signed)
Pomalyst refill request to MD desk.

## 2013-09-22 ENCOUNTER — Telehealth: Payer: Self-pay | Admitting: Medical Oncology

## 2013-09-22 ENCOUNTER — Other Ambulatory Visit: Payer: Self-pay | Admitting: Internal Medicine

## 2013-09-22 DIAGNOSIS — C9000 Multiple myeloma not having achieved remission: Secondary | ICD-10-CM

## 2013-09-22 NOTE — Telephone Encounter (Signed)
Called in restoril.

## 2013-09-27 ENCOUNTER — Encounter: Payer: Self-pay | Admitting: Internal Medicine

## 2013-09-27 ENCOUNTER — Other Ambulatory Visit: Payer: Self-pay | Admitting: *Deleted

## 2013-09-27 ENCOUNTER — Telehealth: Payer: Self-pay | Admitting: Internal Medicine

## 2013-09-27 ENCOUNTER — Ambulatory Visit (INDEPENDENT_AMBULATORY_CARE_PROVIDER_SITE_OTHER): Payer: Medicare Other | Admitting: Internal Medicine

## 2013-09-27 VITALS — BP 118/70 | HR 70 | Temp 98.3°F | Wt 129.2 lb

## 2013-09-27 DIAGNOSIS — R5383 Other fatigue: Secondary | ICD-10-CM

## 2013-09-27 DIAGNOSIS — R4182 Altered mental status, unspecified: Secondary | ICD-10-CM

## 2013-09-27 DIAGNOSIS — Y92009 Unspecified place in unspecified non-institutional (private) residence as the place of occurrence of the external cause: Secondary | ICD-10-CM

## 2013-09-27 DIAGNOSIS — R5381 Other malaise: Secondary | ICD-10-CM

## 2013-09-27 DIAGNOSIS — E119 Type 2 diabetes mellitus without complications: Secondary | ICD-10-CM

## 2013-09-27 DIAGNOSIS — R531 Weakness: Secondary | ICD-10-CM

## 2013-09-27 DIAGNOSIS — E860 Dehydration: Secondary | ICD-10-CM

## 2013-09-27 DIAGNOSIS — W19XXXA Unspecified fall, initial encounter: Secondary | ICD-10-CM

## 2013-09-27 DIAGNOSIS — N390 Urinary tract infection, site not specified: Secondary | ICD-10-CM

## 2013-09-27 DIAGNOSIS — C9000 Multiple myeloma not having achieved remission: Secondary | ICD-10-CM

## 2013-09-27 LAB — HEMOGLOBIN A1C: HEMOGLOBIN A1C: 6.9 % — AB (ref 4.6–6.5)

## 2013-09-27 MED ORDER — POMALIDOMIDE 4 MG PO CAPS: 4.0000 mg | ORAL_CAPSULE | Freq: Every day | ORAL | Status: DC

## 2013-09-27 MED ORDER — OXYCODONE HCL 5 MG PO TABS: 2.5000 mg | ORAL_TABLET | Freq: Four times a day (QID) | ORAL | Status: DC | PRN

## 2013-09-27 NOTE — Patient Instructions (Signed)
Urinary Tract Infection  Urinary tract infections (UTIs) can develop anywhere along your urinary tract. Your urinary tract is your body's drainage system for removing wastes and extra water. Your urinary tract includes two kidneys, two ureters, a bladder, and a urethra. Your kidneys are a pair of bean-shaped organs. Each kidney is about the size of your fist. They are located below your ribs, one on each side of your spine.  CAUSES  Infections are caused by microbes, which are microscopic organisms, including fungi, viruses, and bacteria. These organisms are so small that they can only be seen through a microscope. Bacteria are the microbes that most commonly cause UTIs.  SYMPTOMS   Symptoms of UTIs may vary by age and gender of the patient and by the location of the infection. Symptoms in young women typically include a frequent and intense urge to urinate and a painful, burning feeling in the bladder or urethra during urination. Older women and men are more likely to be tired, shaky, and weak and have muscle aches and abdominal pain. A fever may mean the infection is in your kidneys. Other symptoms of a kidney infection include pain in your back or sides below the ribs, nausea, and vomiting.  DIAGNOSIS  To diagnose a UTI, your caregiver will ask you about your symptoms. Your caregiver also will ask to provide a urine sample. The urine sample will be tested for bacteria and white blood cells. White blood cells are made by your body to help fight infection.  TREATMENT   Typically, UTIs can be treated with medication. Because most UTIs are caused by a bacterial infection, they usually can be treated with the use of antibiotics. The choice of antibiotic and length of treatment depend on your symptoms and the type of bacteria causing your infection.  HOME CARE INSTRUCTIONS   If you were prescribed antibiotics, take them exactly as your caregiver instructs you. Finish the medication even if you feel better after you  have only taken some of the medication.   Drink enough water and fluids to keep your urine clear or pale yellow.   Avoid caffeine, tea, and carbonated beverages. They tend to irritate your bladder.   Empty your bladder often. Avoid holding urine for long periods of time.   Empty your bladder before and after sexual intercourse.   After a bowel movement, women should cleanse from front to back. Use each tissue only once.  SEEK MEDICAL CARE IF:    You have back pain.   You develop a fever.   Your symptoms do not begin to resolve within 3 days.  SEEK IMMEDIATE MEDICAL CARE IF:    You have severe back pain or lower abdominal pain.   You develop chills.   You have nausea or vomiting.   You have continued burning or discomfort with urination.  MAKE SURE YOU:    Understand these instructions.   Will watch your condition.   Will get help right away if you are not doing well or get worse.  Document Released: 05/28/2005 Document Revised: 02/17/2012 Document Reviewed: 09/26/2011  [employer] Patient Information 2014 [employer]

## 2013-09-27 NOTE — Progress Notes (Signed)
HPI: Pt presents today for hospital follow up. She was at [facility] from approx 1/8 to 1/12 for altered mental status, dehydration, and Klebsiella UTI. She was treated with IV antibiotics and fluids and discharged home with PO antibiotics and finished them this week. Daughters with patient today, both deny any concerns. Pt states she feels better, denies any complaints.  She is eating and drinking more, urinating well, and no trouble with mental status or falls. She denies chest pain, shortness of breath, urinary discomfort, dizziness, or fatigue.   Past Medical History  Diagnosis Date  . Blood transfusion   . Depression     Mild  . History of chicken pox   . Hyperlipidemia   . Hypertension   . Degenerative joint disease   . Phlebitis   . Multiple myeloma     per Dr. [name], s/p palliative radiation for leg pain 2011 per Dr. Roselind Messier  . Deep vein thrombosis of bilateral lower extremities   . Family history of anesthesia complication     DAUGHTER CPR AFTER  . Cardiomyopathy   . Diabetes mellitus     Steroid related  . UTI (urinary tract infection) 09/08/2013    Current Outpatient Prescriptions  Medication Sig Dispense Refill  . acyclovir (ZOVIRAX) 400 MG tablet Take 400 mg by mouth 2 (two) times daily.      Marland Kitchen ALPRAZolam (XANAX) 0.5 MG tablet Take 0.5 mg by mouth 2 (two) times daily as needed for anxiety.      . Calcium Carbonate-Vitamin D 600-400 MG-UNIT per tablet Take 1 tablet by mouth 3 (three) times daily with meals.        Marland Kitchen dexamethasone (DECADRON) 4 MG tablet Take 40 mg by mouth once a week.      . gabapentin (NEURONTIN) 100 MG capsule Take 100 mg by mouth daily.      Marland Kitchen glipiZIDE (GLUCOTROL XL) 2.5 MG 24 hr tablet Take 1 tablet (2.5 mg total) by mouth daily.  90 tablet  3  . glucose blood (ONE TOUCH ULTRA TEST) test strip Use as instructed  25 each  6  . guaiFENesin (MUCINEX) 600 MG 12 hr tablet Take 1 tablet (600 mg total) by mouth 2 (two) times daily.  30 tablet  0  .  Lancets (ONETOUCH ULTRASOFT) lancets Test blood sugar once daily or as directed.  100 each  11  . lisinopril-hydrochlorothiazide (PRINZIDE,ZESTORETIC) 20-12.5 MG per tablet TAKE ONE-HALF TABLET BY MOUTH EVERY DAY  45 tablet  1  . loperamide (IMODIUM A-D) 2 MG tablet Take 1 tablet (2 mg total) by mouth 4 (four) times daily as needed for diarrhea or loose stools.      Marland Kitchen loratadine (CLARITIN) 10 MG tablet Take 1 tablet (10 mg total) by mouth daily.  30 tablet  0  . oxyCODONE (OXY IR/ROXICODONE) 5 MG immediate release tablet 2.5-5 mg every 6 (six) hours as needed for severe pain or breakthrough pain. Take 1 1/2 tabs by mouth every 6 hours as needed for pain      . OxyCODONE (OXYCONTIN) 10 mg T12A 12 hr tablet Take 1 tablet (10 mg total) by mouth every 12 (twelve) hours.  60 tablet  0  . pomalidomide (POMALYST) 4 MG capsule Take 4 mg by mouth daily. Take with water on days 1-21. Repeat every 28 days.   Last survey   06/06/13.   Authorization  #   [id number]   08/19/13. Adult female not of reproductive potential.      .  potassium chloride SA (K-DUR,KLOR-CON) 20 MEQ tablet Take 1 tablet (20 mEq total) by mouth daily as needed.  10 tablet  0  . predniSONE (DELTASONE) 20 MG tablet Take 20 mg by mouth once a week.      . prochlorperazine (COMPAZINE) 10 MG tablet Take 1 tablet (10 mg total) by mouth every 6 (six) hours as needed.  30 tablet  1  . saccharomyces boulardii (FLORASTOR) 250 MG capsule Take 1 capsule (250 mg total) by mouth 2 (two) times daily.  30 capsule  0  . temazepam (RESTORIL) 30 MG capsule TAKE ONE CAPSULE BY MOUTH AT BEDTIME AS NEEDED  30 capsule  0  . warfarin (COUMADIN) 5 MG tablet Take 2.5-5 mg by mouth daily at 6 PM. Take one by mouth daily on Monday & Wednesday. Take 1/2 tablet on all other days or as directed       No current facility-administered medications for this visit.   Facility-Administered Medications Ordered in Other Visits  Medication Dose Route Frequency Provider Last Rate  Last Dose  . Zoledronic Acid (ZOMETA) 4 mg IVPB  4 mg Intravenous Once Curt Bears, MD        No Known Allergies  Family History  Problem Relation Age of Onset  . Diabetes Mother   . Hypertension Mother   . Arthritis Mother   . Stroke Mother   . Diabetes Father   . Hypertension Father   . Arthritis Father   . Stroke Father   . Cancer Sister     stomach  . Stroke Brother   . Cancer Brother     prostate <50  . Stroke Daughter   . Stroke Son   . Arthritis Other     Parents  . Cancer Other     Colon, 1st degree relative <60  . Diabetes Other     Parents  . Stroke Other     1st degree relative <60    History   Social History  . Marital Status: Widowed    Spouse Name: N/A    Number of Children: N/A  . Years of Education: N/A   Occupational History  . Retired    Social History Main Topics  . Smoking status: Former Research scientist (life sciences)  . Smokeless tobacco: Never Used     Comment: QIUT MANY YEARS AGO  . Alcohol Use: No  . Drug Use: No  . Sexual Activity: Not on file   Other Topics Concern  . Not on file   Social History Narrative   Regular exercise:  Yes    ROS:  Constitutional: Denies fever, malaise, fatigue, headache or abrupt weight changes.  Respiratory: Denies difficulty breathing, shortness of breath, cough or sputum production.   Cardiovascular: Denies chest pain, chest tightness, palpitations or swelling in the hands or feet.  Gastrointestinal: Denies abdominal pain, bloating, constipation, diarrhea or blood in the stool.  GU: Denies frequency, urgency, pain with urination, blood in urine, odor or discharge. Musculoskeletal: Denies decrease in range of motion, difficulty with gait, muscle pain or joint pain and swelling.   Neurological: Denies dizziness, difficulty with memory, difficulty with speech or problems with balance and coordination.   No other specific complaints in a complete review of systems (except as listed in HPI above).  PE:  BP 118/70   Pulse 70  Temp(Src) 98.3 F (36.8 C) (Oral)  Wt 129 lb 4 oz (58.627 kg)  SpO2 98% Wt Readings from Last 3 Encounters:  09/27/13 129 lb 4 oz (58.627  kg)  09/20/13 125 lb (56.7 kg)  09/19/13 125 lb 11.2 oz (57.017 kg)    General: Appears their stated age, well developed, well nourished in NAD. Cardiovascular: Normal rate and rhythm. S1,S2 noted.  No murmur, rubs or gallops noted. No JVD or BLE edema. No carotid bruits noted. Pulmonary/Chest: Normal effort and positive vesicular breath sounds. No respiratory distress. No wheezes, rales or ronchi noted.  Abdomen: Soft and nontender. Normal bowel sounds, no bruits noted. No distention or masses noted. Liver, spleen and kidneys non palpable. Musculoskeletal: Normal range of motion. No signs of joint swelling. No difficulty with gait.  Neurological: Alert and oriented.  Psychiatric: Mood and affect normal. Behavior is normal. Judgment and thought content normal.     Assessment and Plan: Hospital follow up/Klebsiella UTI: Continue current regimen Educated on UTI symptoms in elderly, handout provided to daughters.  Increase fluids and maintain hydration Will recheck A1c due to elevated blood glucose in hospital Follow up when labs return and as needed for other concerns  Westley Foots, Student-NP

## 2013-09-27 NOTE — Telephone Encounter (Signed)
returned pt call adn lvm advised on this weeks appts per vm request.

## 2013-09-27 NOTE — Progress Notes (Signed)
Subjective:    Patient ID: [name], female    DOB: [birth year], 78 y.o.   MRN: [id number]  HPI  Pt presents to the clinic today for hospital follow up. She had been feeling really weak, and experienced a fall on 09/08/13/ Her family report prior to that the "she was not acting like herself". She did have a mild cough, but no fever, chills, or shortness of breath. She has a negative CXR and CT head. Her labs showed a UTI otherwise normal. Her urine did grow out Klebsiella. She was admitted for IV antibiotics and fluids. After 4 days, she was discharged in stable condition of ceftin. She does have a history of multiple myeloma. She is being followed by Dr. Julien Nordmann. She has already had a follow up appointment with him. Since being discharged from the hospital, she has been feeling well. Her family reports that she is back to her baseline mentally. She does report that she is eating, drinking and urinating well. She has not had any more falls. She has finished her antibiotics.  Review of Systems      Past Medical History  Diagnosis Date  . Blood transfusion   . Depression     Mild  . History of chicken pox   . Hyperlipidemia   . Hypertension   . Degenerative joint disease   . Phlebitis   . Multiple myeloma     per Dr. Julien Nordmann, s/p palliative radiation for leg pain 2011 per Dr. Sondra Come  . Deep vein thrombosis of bilateral lower extremities   . Family history of anesthesia complication     DAUGHTER CPR AFTER  . Cardiomyopathy   . Diabetes mellitus     Steroid related  . UTI (urinary tract infection) 09/08/2013    Current Outpatient Prescriptions  Medication Sig Dispense Refill  . acyclovir (ZOVIRAX) 400 MG tablet Take 400 mg by mouth 2 (two) times daily.      Marland Kitchen ALPRAZolam (XANAX) 0.5 MG tablet Take 0.5 mg by mouth 2 (two) times daily as needed for anxiety.      . Calcium Carbonate-Vitamin D 600-400 MG-UNIT per tablet Take 1 tablet by mouth 3 (three) times daily with meals.        Marland Kitchen  dexamethasone (DECADRON) 4 MG tablet Take 40 mg by mouth once a week.      . gabapentin (NEURONTIN) 100 MG capsule Take 100 mg by mouth daily.      Marland Kitchen glipiZIDE (GLUCOTROL XL) 2.5 MG 24 hr tablet Take 1 tablet (2.5 mg total) by mouth daily.  90 tablet  3  . glucose blood (ONE TOUCH ULTRA TEST) test strip Use as instructed  25 each  6  . guaiFENesin (MUCINEX) 600 MG 12 hr tablet Take 1 tablet (600 mg total) by mouth 2 (two) times daily.  30 tablet  0  . Lancets (ONETOUCH ULTRASOFT) lancets Test blood sugar once daily or as directed.  100 each  11  . lisinopril-hydrochlorothiazide (PRINZIDE,ZESTORETIC) 20-12.5 MG per tablet TAKE ONE-HALF TABLET BY MOUTH EVERY DAY  45 tablet  1  . loperamide (IMODIUM A-D) 2 MG tablet Take 1 tablet (2 mg total) by mouth 4 (four) times daily as needed for diarrhea or loose stools.      Marland Kitchen loratadine (CLARITIN) 10 MG tablet Take 1 tablet (10 mg total) by mouth daily.  30 tablet  0  . oxyCODONE (OXY IR/ROXICODONE) 5 MG immediate release tablet 2.5-5 mg every 6 (six) hours as needed for severe pain  or breakthrough pain. Take 1 1/2 tabs by mouth every 6 hours as needed for pain      . OxyCODONE (OXYCONTIN) 10 mg T12A 12 hr tablet Take 1 tablet (10 mg total) by mouth every 12 (twelve) hours.  60 tablet  0  . pomalidomide (POMALYST) 4 MG capsule Take 4 mg by mouth daily. Take with water on days 1-21. Repeat every 28 days.   Last survey   06/06/13.   Authorization  #   [id number]   08/19/13. Adult female not of reproductive potential.      . potassium chloride SA (K-DUR,KLOR-CON) 20 MEQ tablet Take 1 tablet (20 mEq total) by mouth daily as needed.  10 tablet  0  . predniSONE (DELTASONE) 20 MG tablet Take 20 mg by mouth once a week.      . prochlorperazine (COMPAZINE) 10 MG tablet Take 1 tablet (10 mg total) by mouth every 6 (six) hours as needed.  30 tablet  1  . saccharomyces boulardii (FLORASTOR) 250 MG capsule Take 1 capsule (250 mg total) by mouth 2 (two) times daily.  30 capsule   0  . temazepam (RESTORIL) 30 MG capsule TAKE ONE CAPSULE BY MOUTH AT BEDTIME AS NEEDED  30 capsule  0  . warfarin (COUMADIN) 5 MG tablet Take 2.5-5 mg by mouth daily at 6 PM. Take one by mouth daily on Monday & Wednesday. Take 1/2 tablet on all other days or as directed       No current facility-administered medications for this visit.   Facility-Administered Medications Ordered in Other Visits  Medication Dose Route Frequency Provider Last Rate Last Dose  . Zoledronic Acid (ZOMETA) 4 mg IVPB  4 mg Intravenous Once Si Gaul, MD        No Known Allergies  Family History  Problem Relation Age of Onset  . Diabetes Mother   . Hypertension Mother   . Arthritis Mother   . Stroke Mother   . Diabetes Father   . Hypertension Father   . Arthritis Father   . Stroke Father   . Cancer Sister     stomach  . Stroke Brother   . Cancer Brother     prostate <50  . Stroke Daughter   . Stroke Son   . Arthritis Other     Parents  . Cancer Other     Colon, 1st degree relative <60  . Diabetes Other     Parents  . Stroke Other     1st degree relative <60    History   Social History  . Marital Status: Widowed    Spouse Name: N/A    Number of Children: N/A  . Years of Education: N/A   Occupational History  . Retired    Social History Main Topics  . Smoking status: Former Games developer  . Smokeless tobacco: Never Used     Comment: QIUT MANY YEARS AGO  . Alcohol Use: No  . Drug Use: No  . Sexual Activity: Not on file   Other Topics Concern  . Not on file   Social History Narrative   Regular exercise:  Yes     Constitutional: Pt reports fatigue. Denies fever, malaise, headache or abrupt weight changes.  Respiratory: Denies difficulty breathing, shortness of breath, cough or sputum production.   Cardiovascular: Denies chest pain, chest tightness, palpitations or swelling in the hands or feet.  Gastrointestinal: Denies abdominal pain, bloating, constipation, diarrhea or blood  in the stool.  GU: Denies urgency,  frequency, pain with urination, burning sensation, blood in urine, odor or discharge. Neurological: Denies dizziness, difficulty with memory, difficulty with speech or problems with balance and coordination.   No other specific complaints in a complete review of systems (except as listed in HPI above).   Objective:   Physical Exam  BP 118/70  Pulse 70  Temp(Src) 98.3 F (36.8 C) (Oral)  Wt 129 lb 4 oz (58.627 kg)  SpO2 98% Wt Readings from Last 3 Encounters:  09/27/13 129 lb 4 oz (58.627 kg)  09/20/13 125 lb (56.7 kg)  09/19/13 125 lb 11.2 oz (57.017 kg)    General: Appears her stated age, chronically ill appearing in NAD. Cardiovascular: Normal rate and rhythm. S1,S2 noted.  No murmur, rubs or gallops noted. No JVD or BLE edema. No carotid bruits noted. Pulmonary/Chest: Normal effort and positive vesicular breath sounds. No respiratory distress. No wheezes, rales or ronchi noted.  Abdomen: Soft and nontender. Normal bowel sounds, no bruits noted. No distention or masses noted. Liver, spleen and kidneys non palpable. Neurological: Alert and oriented. Cranial nerves II-XII intact. Coordination normal. +DTRs bilaterally.  BMET    Component Value Date/Time   NA 143 09/19/2013 1338   NA 142 09/09/2013 0608   K 3.5 09/19/2013 1338   K 4.1 09/09/2013 0608   CL 110 09/09/2013 0608   CL 107 02/14/2013 1311   CO2 28 09/19/2013 1338   CO2 20 09/09/2013 0608   GLUCOSE 174* 09/19/2013 1338   GLUCOSE 153* 09/09/2013 0608   GLUCOSE 202* 02/14/2013 1311   BUN 15.0 09/19/2013 1338   BUN 15 09/09/2013 0608   CREATININE 1.0 09/19/2013 1338   CREATININE 0.97 09/09/2013 0608   CALCIUM 10.2 09/19/2013 1338   CALCIUM 8.3* 09/09/2013 0608   CALCIUM  Value: 5.7 CRITICAL RESULT CALLED TO, READ BACK BY AND VERIFIED WITH: JAMIE TRACY,RN [id number] @ [address] (NOTE)  Amended report. Result repeated and verified. CORRECTED ON 10/14 AT 1429: PREVIOUSLY REPORTED AS Result repeated and  verified.* 06/13/2008 1605   GFRNONAA 56* 09/09/2013 0608   GFRAA 65* 09/09/2013 0608    Lipid Panel     Component Value Date/Time   CHOL 200 04/08/2010 1246   TRIG 106.0 04/08/2010 1246   HDL 84.50 04/08/2010 1246   CHOLHDL 2 04/08/2010 1246   VLDL 21.2 04/08/2010 1246   LDLCALC 94 04/08/2010 1246    CBC    Component Value Date/Time   WBC 3.7* 09/19/2013 1338   WBC 3.8* 09/09/2013 0608   RBC 3.53* 09/19/2013 1338   RBC 2.81* 09/09/2013 0608   RBC 3.45* 06/09/2008 1340   HGB 11.3* 09/19/2013 1338   HGB 8.9* 09/09/2013 0608   HCT 34.8 09/19/2013 1338   HCT 27.7* 09/09/2013 0608   PLT 316 09/19/2013 1338   PLT 209 09/09/2013 0608   MCV 98.6 09/19/2013 1338   MCV 98.6 09/09/2013 0608   MCH 32.1 09/19/2013 1338   MCH 31.7 09/09/2013 0608   MCHC 32.5 09/19/2013 1338   MCHC 32.1 09/09/2013 0608   RDW 17.1* 09/19/2013 1338   RDW 15.6* 09/09/2013 0608   LYMPHSABS 0.7* 09/19/2013 1338   LYMPHSABS 0.4* 09/08/2013 0105   MONOABS 0.3 09/19/2013 1338   MONOABS 0.4 09/08/2013 0105   EOSABS 0.1 09/19/2013 1338   EOSABS 0.4 09/08/2013 0105   BASOSABS 0.0 09/19/2013 1338   BASOSABS 0.0 09/08/2013 0105    Hgb A1C Lab Results  Component Value Date   HGBA1C 6.7* 04/14/2013  Assessment & Plan:   Hospital follow up for Klebsiella UTI:  Advised pt to continue drinking lots of fluids to avoid dehydration Information given about monitoring for recurrent infection Will recheck A1C today as sugars were elevated in hospital (she is on steroids for treatment of multiple myeloma) Education provided on fall risk and home safety  More than 50% of time spent counseling and coordinating care  RTC as needed or if symptoms return

## 2013-09-27 NOTE — Progress Notes (Signed)
Pre-visit discussion using our clinic review tool. No additional management support is needed unless otherwise documented below in the visit note.  

## 2013-09-28 ENCOUNTER — Other Ambulatory Visit: Payer: Self-pay | Admitting: *Deleted

## 2013-09-28 NOTE — Telephone Encounter (Signed)
[facility] sent facsimile confirmation of Pomalyst prescription shipment.  Pomalyst was shipped on 09-27-2013 with next business day delivery.

## 2013-09-29 ENCOUNTER — Telehealth: Payer: Self-pay

## 2013-09-29 ENCOUNTER — Ambulatory Visit: Payer: Medicare Other

## 2013-09-29 ENCOUNTER — Other Ambulatory Visit: Payer: Medicare Other

## 2013-09-29 NOTE — Telephone Encounter (Signed)
Relevant patient education mailed to patient.  

## 2013-09-30 ENCOUNTER — Ambulatory Visit: Payer: Medicare Other

## 2013-10-02 ENCOUNTER — Encounter: Payer: Self-pay | Admitting: Radiation Oncology

## 2013-10-02 NOTE — Progress Notes (Signed)
   Department of Radiation Oncology  Phone:  [phone] Fax:        [phone]  Special treatment procedure note-performed on 08/26/2013  December 26 the patient underwent special treatment procedure. The patient had a prior history of radiation therapy in the lower lumbar spine and upper sacrum region. Given the additional time in reviewing the patient's previous setup as it relates to her current set up and potential for increased toxicities related to overlapping fields, this constitutes a special treatment procedure.  -----------------------------------  Blair Promise, PhD, MD

## 2013-10-02 NOTE — Progress Notes (Signed)
  Radiation Oncology         [phone]) [phone] ________________________________  Name: [name] MRN: [id number]  Date: 10/02/2013  DOB: [birth year]  End of Treatment Note  Diagnosis:   Multiple myeloma     Indication for treatment:  Pain in the lower back/sacrum area       Radiation treatment dates:   December 30 through January 20  Site/dose:   Lower lumbar/upper sacrum region, 20 gray in 10 fractions  Beams/energy:   4 field set up using the AP, PA, left lateral, right lateral; 15 and 10 MV photons  Narrative: The patient tolerated radiation treatment relatively well.   She noticed some mild improvement in her pain.  Plan: The patient has completed radiation treatment. The patient will return to radiation oncology clinic for routine followup in one month. I advised them to call or return sooner if they have any questions or concerns related to their recovery or treatment.  -----------------------------------  Blair Promise, PhD, MD

## 2013-10-02 NOTE — Progress Notes (Signed)
  Radiation Oncology         [phone]) [phone] ________________________________  Name: [name] MRN: [id number]  Date: 10/02/2013  DOB: [birth year]  Simulation Verification Note - performed on 08/30/2013  Status: outpatient  NARRATIVE: The patient was brought to the treatment unit and placed in the planned treatment position. The clinical setup was verified. Then port films were obtained and uploaded to the radiation oncology medical record software.  The treatment beams were carefully compared against the planned radiation fields. The position location and shape of the radiation fields was reviewed. They targeted volume of tissue appears to be appropriately covered by the radiation beams. Organs at risk appear to be excluded as planned.  Based on my personal review, I approved the simulation verification. [name] treatment will proceed as planned.  -----------------------------------  Blair Promise, PhD, MD

## 2013-10-05 NOTE — Telephone Encounter (Signed)
erroneous

## 2013-10-09 ENCOUNTER — Other Ambulatory Visit: Payer: Self-pay | Admitting: Internal Medicine

## 2013-10-10 ENCOUNTER — Other Ambulatory Visit: Payer: Self-pay | Admitting: *Deleted

## 2013-10-10 DIAGNOSIS — C9000 Multiple myeloma not having achieved remission: Secondary | ICD-10-CM

## 2013-10-10 MED ORDER — OXYCODONE HCL 5 MG PO TABS: 2.5000 mg | ORAL_TABLET | Freq: Four times a day (QID) | ORAL | Status: DC | PRN

## 2013-10-10 MED ORDER — OXYCODONE HCL ER 10 MG PO T12A: 10.0000 mg | EXTENDED_RELEASE_TABLET | Freq: Two times a day (BID) | ORAL | Status: DC

## 2013-10-13 ENCOUNTER — Ambulatory Visit: Payer: Medicare Other | Admitting: Family Medicine

## 2013-10-19 ENCOUNTER — Encounter: Payer: Self-pay | Admitting: Physician Assistant

## 2013-10-19 ENCOUNTER — Other Ambulatory Visit (HOSPITAL_BASED_OUTPATIENT_CLINIC_OR_DEPARTMENT_OTHER): Payer: Medicare Other

## 2013-10-19 ENCOUNTER — Ambulatory Visit (HOSPITAL_BASED_OUTPATIENT_CLINIC_OR_DEPARTMENT_OTHER): Payer: Medicare Other

## 2013-10-19 ENCOUNTER — Encounter (INDEPENDENT_AMBULATORY_CARE_PROVIDER_SITE_OTHER): Payer: Self-pay

## 2013-10-19 ENCOUNTER — Ambulatory Visit (HOSPITAL_BASED_OUTPATIENT_CLINIC_OR_DEPARTMENT_OTHER): Payer: Medicare Other | Admitting: Physician Assistant

## 2013-10-19 ENCOUNTER — Ambulatory Visit: Payer: Medicare Other | Admitting: Pharmacist

## 2013-10-19 ENCOUNTER — Ambulatory Visit: Payer: Medicare Other | Admitting: Internal Medicine

## 2013-10-19 ENCOUNTER — Telehealth: Payer: Self-pay | Admitting: Internal Medicine

## 2013-10-19 VITALS — BP 111/51 | HR 74 | Temp 98.4°F | Resp 17 | Ht <= 58 in | Wt 128.8 lb

## 2013-10-19 DIAGNOSIS — R5383 Other fatigue: Secondary | ICD-10-CM

## 2013-10-19 DIAGNOSIS — M549 Dorsalgia, unspecified: Secondary | ICD-10-CM

## 2013-10-19 DIAGNOSIS — I82409 Acute embolism and thrombosis of unspecified deep veins of unspecified lower extremity: Secondary | ICD-10-CM

## 2013-10-19 DIAGNOSIS — C9000 Multiple myeloma not having achieved remission: Secondary | ICD-10-CM

## 2013-10-19 DIAGNOSIS — G609 Hereditary and idiopathic neuropathy, unspecified: Secondary | ICD-10-CM

## 2013-10-19 DIAGNOSIS — I82401 Acute embolism and thrombosis of unspecified deep veins of right lower extremity: Secondary | ICD-10-CM

## 2013-10-19 DIAGNOSIS — F919 Conduct disorder, unspecified: Secondary | ICD-10-CM

## 2013-10-19 DIAGNOSIS — C9002 Multiple myeloma in relapse: Secondary | ICD-10-CM

## 2013-10-19 DIAGNOSIS — N390 Urinary tract infection, site not specified: Secondary | ICD-10-CM

## 2013-10-19 DIAGNOSIS — R5381 Other malaise: Secondary | ICD-10-CM

## 2013-10-19 LAB — CBC WITH DIFFERENTIAL/PLATELET
BASO%: 2.4 % — ABNORMAL HIGH (ref 0.0–2.0)
Basophils Absolute: 0.1 10*3/uL (ref 0.0–0.1)
EOS%: 6.9 % (ref 0.0–7.0)
Eosinophils Absolute: 0.3 10*3/uL (ref 0.0–0.5)
HCT: 37 % (ref 34.8–46.6)
HGB: 11.9 g/dL (ref 11.6–15.9)
LYMPH%: 17.3 % (ref 14.0–49.7)
MCH: 31.7 pg (ref 25.1–34.0)
MCHC: 32.1 g/dL (ref 31.5–36.0)
MCV: 98.6 fL (ref 79.5–101.0)
MONO#: 0.7 10*3/uL (ref 0.1–0.9)
MONO%: 14.3 % — AB (ref 0.0–14.0)
NEUT#: 2.9 10*3/uL (ref 1.5–6.5)
NEUT%: 59.1 % (ref 38.4–76.8)
Platelets: 295 10*3/uL (ref 145–400)
RBC: 3.75 10*6/uL (ref 3.70–5.45)
RDW: 17.1 % — AB (ref 11.2–14.5)
WBC: 4.9 10*3/uL (ref 3.9–10.3)
lymph#: 0.9 10*3/uL (ref 0.9–3.3)

## 2013-10-19 LAB — COMPREHENSIVE METABOLIC PANEL (CC13)
ALK PHOS: 54 U/L (ref 40–150)
ALT: 21 U/L (ref 0–55)
AST: 18 U/L (ref 5–34)
Albumin: 3.5 g/dL (ref 3.5–5.0)
Anion Gap: 12 mEq/L — ABNORMAL HIGH (ref 3–11)
BUN: 15.5 mg/dL (ref 7.0–26.0)
CO2: 28 mEq/L (ref 22–29)
CREATININE: 0.9 mg/dL (ref 0.6–1.1)
Calcium: 9.8 mg/dL (ref 8.4–10.4)
Chloride: 105 mEq/L (ref 98–109)
Glucose: 185 mg/dl — ABNORMAL HIGH (ref 70–140)
POTASSIUM: 3.4 meq/L — AB (ref 3.5–5.1)
Sodium: 145 mEq/L (ref 136–145)
Total Bilirubin: 0.43 mg/dL (ref 0.20–1.20)
Total Protein: 6.4 g/dL (ref 6.4–8.3)

## 2013-10-19 LAB — URINALYSIS, MICROSCOPIC - CHCC
BILIRUBIN (URINE): NEGATIVE
Glucose: 1000 mg/dL
KETONES: NEGATIVE mg/dL
Leukocyte Esterase: NEGATIVE
Nitrite: NEGATIVE
PH: 6.5 (ref 4.6–8.0)
PROTEIN: NEGATIVE mg/dL
SPECIFIC GRAVITY, URINE: 1.02 (ref 1.003–1.035)
Urobilinogen, UR: 0.2 mg/dL (ref 0.2–1)
WBC, UA: NEGATIVE (ref 0–2)

## 2013-10-19 LAB — PROTIME-INR
INR: 3.6 — ABNORMAL HIGH (ref 2.00–3.50)
Protime: 43.2 Seconds — ABNORMAL HIGH (ref 10.6–13.4)

## 2013-10-19 LAB — POCT INR: INR: 3.6

## 2013-10-19 NOTE — Progress Notes (Signed)
INR = 3.6 No extra doses. No medication changes. No dietary changes. INR is supratherapeutic today. Patient has been stable at the current dose but has slightly supratherapeutic INRs at the current dose in the past. No complications of anticoagulation noted. Patient or patient's family will call if she has bleeding or bruising. Told patient it is okay to have a single helping of greens with increased INR, she plans to do this. Will continue current Coumadin dose of

## 2013-10-19 NOTE — Progress Notes (Signed)
INR = 3.6 INR supratherapeutic today. Patient has been stable at the current dose. No medication changes. No dietary changes. No extra doses of Coumadin taken. Patient states she has had no bleeding or bruising. She will eat a single helping of greens this week, family states they will prepare this for her. She will continue Coumadin 2.5 mg daily except 5 mg Mondays and Wednesdays. She will return on 10/31/13 for lab at 2:30, Coumadin clinic at 2:45, and MD appt at 3:00.  Theone Murdoch, PharmD

## 2013-10-19 NOTE — Telephone Encounter (Signed)
gv and printed appt sched and avs for pt for March....sed added lab

## 2013-10-20 ENCOUNTER — Other Ambulatory Visit: Payer: Self-pay | Admitting: *Deleted

## 2013-10-20 DIAGNOSIS — C9000 Multiple myeloma not having achieved remission: Secondary | ICD-10-CM

## 2013-10-20 LAB — URINE CULTURE

## 2013-10-20 NOTE — Telephone Encounter (Signed)
THIS REFILL REQUEST FOR POMALYST WAS GIVEN TO DR.MOHAMED'S NURSE, STEPHANIE JOHNSON,RN.

## 2013-10-20 NOTE — Progress Notes (Addendum)
[place] [phone]   [phone]  SHARED VISIT PROGRESS NOTE  Lindsay Stain, MD [place] [address]  DIAGNOSIS:  1. multiple myeloma diagnosed in September 2009,  2. history of bilateral lower extremity deep vein thrombosis diagnosed in November 2009  3. acute on chronic deep vein thrombosis in the left lower extremity diagnosed in October 2010.   PRIOR THERAPY:  1. status post palliative radiotherapy to the right femur and is she'll tuberosity, first was done in December 2011 and the second treatment was completed Jan 08 2011, and the third treatment to the right distal femur completed on 04/30/2011  2. status post palliative radiotherapy to the left proximal femur completed 02/28/2011.  3. Revlimid 25 mg by mouth daily for 21 days every 4 weeks in addition to oral Decadron at 40 mg by mouth on a weekly basis status post 40 cycles.  4. weekly subcutaneous Velcade 1.3 mg/M2 status post 60 cycles, discontinued today secondary to mild disease progression.   CURRENT THERAPY:  1. Pomalyst 4 mg by mouth daily for 21 days every 4 weeks, the patient started the first dose on 12/22/2012 , in addition to Decadron 40 mg weekly. Status post 7 cycles. She started cycle #8 on 08/22/2013. 2. Coumadin 5 mg on Mondays and Thursdays and 2.5 mg on all other days.  3. Zometa 4 mg IV given every 2 months. Last given 09/02/2013   INTERVAL HISTORY: [name] 78 y.o. female returns to the clinic today for followup visit accompanied by her  2 daughters. Due to an error in filling her medication box she's been off her Pomalyst  for the last several days. She currently has 9 tablets remaining in this current cycle of Pomalyst. Her daughter [name] notes some behavioral issues in terms of aggressive, mean spirited speech. She's also gone outside in inappropriate clothing in this cold weather. She seems a bit more disoriented. She completed her  course of antibiotics for the urinary tract infection however [name] doesn't recall a followup urinalysis and urine culture being obtained when she was recently at the primary care physician's office.  The patient continues to complain of increasing fatigue and low back pain and peripheral neuropathy in the lower extremities. She is tolerating her treatment with Pomalyst fairly well. She denied having any significant nausea or vomiting. The patient denied having any significant weight loss or night sweats. She has no chest pain, shortness of breath, cough or hemoptysis. She is appropriate in her demeanor and responses today.  MEDICAL HISTORY: Past Medical History  Diagnosis Date  . Blood transfusion   . Depression     Mild  . History of chicken pox   . Hyperlipidemia   . Hypertension   . Degenerative joint disease   . Phlebitis   . Multiple myeloma     per Dr. Julien Nordmann, s/p palliative radiation for leg pain 2011 per Dr. Sondra Come  . Deep vein thrombosis of bilateral lower extremities   . Family history of anesthesia complication     DAUGHTER CPR AFTER  . Cardiomyopathy   . Diabetes mellitus     Steroid related  . UTI (urinary tract infection) 09/08/2013    ALLERGIES:  has No Known Allergies.  MEDICATIONS:  Current Outpatient Prescriptions  Medication Sig Dispense Refill  . acyclovir (ZOVIRAX) 400 MG tablet Take 400 mg by mouth 2 (two) times daily.      Marland Kitchen ALPRAZolam (XANAX) 0.5 MG tablet  Take 0.5 mg by mouth 2 (two) times daily as needed for anxiety.      . Calcium Carbonate-Vitamin D 600-400 MG-UNIT per tablet Take 1 tablet by mouth 3 (three) times daily with meals.        . cefUROXime (CEFTIN) 500 MG tablet Take 500 mg by mouth 2 (two) times daily with a meal.      . dexamethasone (DECADRON) 4 MG tablet TAKE 10 TABLETS BY MOUTH ONCE PER WEEK  40 tablet  0  . glipiZIDE (GLUCOTROL XL) 2.5 MG 24 hr tablet Take 1 tablet (2.5 mg total) by mouth daily.  90 tablet  3  . glucose blood (ONE  TOUCH ULTRA TEST) test strip Use as instructed  25 each  6  . Lancets (ONETOUCH ULTRASOFT) lancets Test blood sugar once daily or as directed.  100 each  11  . lisinopril-hydrochlorothiazide (PRINZIDE,ZESTORETIC) 20-12.5 MG per tablet TAKE ONE-HALF TABLET BY MOUTH EVERY DAY  45 tablet  1  . loperamide (IMODIUM A-D) 2 MG tablet Take 1 tablet (2 mg total) by mouth 4 (four) times daily as needed for diarrhea or loose stools.      Marland Kitchen loratadine (CLARITIN) 10 MG tablet Take 1 tablet (10 mg total) by mouth daily.  30 tablet  0  . oxyCODONE (OXY IR/ROXICODONE) 5 MG immediate release tablet Take 0.5-1 tablets (2.5-5 mg total) by mouth every 6 (six) hours as needed for severe pain or breakthrough pain. Take 1 1/2 tabs by mouth every 6 hours as needed for pain  90 tablet  0  . OxyCODONE (OXYCONTIN) 10 mg T12A 12 hr tablet Take 1 tablet (10 mg total) by mouth every 12 (twelve) hours.  60 tablet  0  . pomalidomide (POMALYST) 4 MG capsule Take 1 capsule (4 mg total) by mouth daily. Take with water on days 1-21. Repeat every 28 days.   Last survey   06/06/13.   Authorization  #   [id number]   09/22/13. Adult female not of reproductive potential.  21 capsule  0  . potassium chloride SA (K-DUR,KLOR-CON) 20 MEQ tablet Take 1 tablet (20 mEq total) by mouth daily as needed.  10 tablet  0  . predniSONE (DELTASONE) 20 MG tablet Take 20 mg by mouth once a week.      . prochlorperazine (COMPAZINE) 10 MG tablet Take 1 tablet (10 mg total) by mouth every 6 (six) hours as needed.  30 tablet  1  . saccharomyces boulardii (FLORASTOR) 250 MG capsule Take 1 capsule (250 mg total) by mouth 2 (two) times daily.  30 capsule  0  . temazepam (RESTORIL) 30 MG capsule TAKE ONE CAPSULE BY MOUTH AT BEDTIME AS NEEDED  30 capsule  0  . warfarin (COUMADIN) 5 MG tablet Take 2.5-5 mg by mouth daily at 6 PM. Take one by mouth daily on Monday & Wednesday. Take 1/2 tablet on all other days or as directed      . gabapentin (NEURONTIN) 100 MG capsule Take  100 mg by mouth daily.      Marland Kitchen guaiFENesin (MUCINEX) 600 MG 12 hr tablet Take 1 tablet (600 mg total) by mouth 2 (two) times daily.  30 tablet  0   No current facility-administered medications for this visit.   Facility-Administered Medications Ordered in Other Visits  Medication Dose Route Frequency Provider Last Rate Last Dose  . Zoledronic Acid (ZOMETA) 4 mg IVPB  4 mg Intravenous Once Curt Bears, MD        SURGICAL  HISTORY:  Past Surgical History  Procedure Laterality Date  . Abdominal hysterectomy    . Ankle fracture surgery Left     REVIEW OF SYSTEMS:  Constitutional: positive for fatigue Eyes: negative Ears, nose, mouth, throat, and face: negative Respiratory: negative Cardiovascular: negative Gastrointestinal: negative Genitourinary:negative Integument/breast: negative Hematologic/lymphatic: negative Musculoskeletal:positive for back pain Neurological: positive for paresthesia Behavioral/Psych: positive for aggressive behavior, anxiety, bad mood, behavior problems, fatigue and irritability Endocrine: negative Allergic/Immunologic: negative   PHYSICAL EXAMINATION: General appearance: alert, cooperative, fatigued and no distress Head: Normocephalic, without obvious abnormality, atraumatic Neck: no adenopathy, no JVD, supple, symmetrical, trachea midline and thyroid not enlarged, symmetric, no tenderness/mass/nodules Lymph nodes: Cervical, supraclavicular, and axillary nodes normal. Resp: clear to auscultation bilaterally Back: symmetric, no curvature. ROM normal. No CVA tenderness. Cardio: regular rate and rhythm, S1, S2 normal, no murmur, click, rub or gallop GI: soft, non-tender; bowel sounds normal; no masses,  no organomegaly Extremities: extremities normal, atraumatic, no cyanosis or edema Neurologic: Alert and oriented X 3, normal strength and tone. Normal symmetric reflexes. Normal coordination and gait  ECOG PERFORMANCE STATUS: 2 - Symptomatic, <50%  confined to bed  Blood pressure 111/51, pulse 74, temperature 98.4 F (36.9 C), temperature source Oral, resp. rate 17, height $RemoveBe'4\' 10"'foAhwegbx$  (1.473 m), weight 128 lb 12.8 oz (58.423 kg), SpO2 98.00%.  LABORATORY DATA: Lab Results  Component Value Date   WBC 4.9 10/19/2013   HGB 11.9 10/19/2013   HCT 37.0 10/19/2013   MCV 98.6 10/19/2013   PLT 295 10/19/2013      Chemistry      Component Value Date/Time   NA 145 10/19/2013 1429   NA 142 09/09/2013 0608   K 3.4* 10/19/2013 1429   K 4.1 09/09/2013 0608   CL 110 09/09/2013 0608   CL 107 02/14/2013 1311   CO2 28 10/19/2013 1429   CO2 20 09/09/2013 0608   BUN 15.5 10/19/2013 1429   BUN 15 09/09/2013 0608   CREATININE 0.9 10/19/2013 1429   CREATININE 0.97 09/09/2013 0608      Component Value Date/Time   CALCIUM 9.8 10/19/2013 1429   CALCIUM 8.3* 09/09/2013 0608   CALCIUM  Value: 5.7 CRITICAL RESULT CALLED TO, READ BACK BY AND VERIFIED WITH: [place] [id number] @ 0737 [place] (NOTE)  Amended report. Result repeated and verified. CORRECTED ON 10/14 AT 1429: PREVIOUSLY REPORTED AS Result repeated and verified.* 06/13/2008 1605   ALKPHOS 54 10/19/2013 1429   ALKPHOS 44 09/08/2013 0105   AST 18 10/19/2013 1429   AST 21 09/08/2013 0105   ALT 21 10/19/2013 1429   ALT 15 09/08/2013 0105   BILITOT 0.43 10/19/2013 1429   BILITOT 0.4 09/08/2013 0105       RADIOGRAPHIC STUDIES: No results found.  ASSESSMENT AND PLAN: This is a very pleasant 78 years old African American female with multiple myeloma currently treatment was Pomalyst 4 mg by mouth daily for 21 days every 4 weeks status post 8 cycles. The patient is tolerating her treatment fairly well. The patient was discussed with and also seen by Dr. Julien Nordmann. We will have her resume the remaining Pomalyst tablets. She will then take one week off as previously and start the next cycle as scheduled. Regarding her behavioral issues he could be related to her medications or a smoldering infection. We will have the patient to  give [place] another urine sample, which we will send off for clean catch urinalysis and urine C&S to look for a smoldering the UTI. Regarding  her pain medication we've asked her daughter to only give her the OxyContin at bedtime and to use his short acting pain medication during the day. Additionally the morning and Xanax tablet will be had with half the dose given in the morning and the other half mid day. Should there be a urinary tract infection appropriate antibiotic therapy will be will be instituted. She'll return as scheduled for her next Zometa infusion. She'll followup with Dr. Julien Nordmann in one month for another symptom management visit with repeat CBC differential, C. met and LDH. She will  continue on Pomalyst with the same dose. Should she continue to have the forgetfulness and behavioral issues patient may need to be evaluated by her primary care physician further. She will also continue on Zometa every 2 months as scheduled. The patient voices understanding of current disease status and treatment options and is in agreement with the current care plan.  All questions were answered. The patient knows to call the clinic with any problems, questions or concerns. We can certainly see the patient much sooner if necessary.  Carlton Adam PA-C  ADDENDUM: Hematology/Oncology Attending: I had the face to face encounter with the patient. I recommended her care plan. This is a very pleasant 78 years old [place] American female with a diagnosis of relapsed multiple myeloma currently on treatment with Pomalyst 4 mg by mouth daily for 21 days every 4 weeks status post 8 cycles and she is tolerating her treatment fairly well. She still have 9 tablets to complete cycle #9. The patient continues to do fine except for the fatigue and pain on the lower back and lower extremities. She also has some behavioral changes with recently that could be related to her polypharmacy and pain medications. Will adjust her pain  medication and advised her to take OxyContin only at bedtime and use the breakthrough pain medication during the daytime. Will also check urinalysis and culture sensitivity to rule out GI tract infection. The patient will continue her current treatment was Pomalyst and Zometa as scheduled. She would come back for followup visit in one month for reevaluation. She was advised to call immediately if she has any concerning symptoms in the interval.  Disclaimer: This note was dictated with voice recognition software. Similar sounding words can inadvertently be transcribed and may not be corrected upon review. Eilleen Kempf., MD 10/23/2013

## 2013-10-20 NOTE — Patient Instructions (Signed)
Continue Pomalyst as prescribed Follow up as scheduled for you Zometa infusion Follow up with Dr. Julien Nordmann in one month

## 2013-10-21 ENCOUNTER — Encounter: Payer: Self-pay | Admitting: Oncology

## 2013-10-21 MED ORDER — POMALIDOMIDE 4 MG PO CAPS: 4.0000 mg | ORAL_CAPSULE | Freq: Every day | ORAL | Status: DC

## 2013-10-21 NOTE — Addendum Note (Signed)
Addended by: Wyonia Hough on: 10/21/2013 02:20 PM   Modules accepted: Orders

## 2013-10-24 ENCOUNTER — Encounter: Payer: Self-pay | Admitting: Radiation Oncology

## 2013-10-24 ENCOUNTER — Ambulatory Visit
Admission: RE | Admit: 2013-10-24 | Discharge: 2013-10-24 | Disposition: A | Discharge status: Home / Self Care | Payer: Medicare Other | Source: Ambulatory Visit | Attending: Radiation Oncology | Admitting: Radiation Oncology

## 2013-10-24 VITALS — BP 145/61 | HR 80 | Temp 98.7°F | Ht <= 58 in | Wt 131.0 lb

## 2013-10-24 DIAGNOSIS — C9000 Multiple myeloma not having achieved remission: Secondary | ICD-10-CM

## 2013-10-24 NOTE — Progress Notes (Signed)
Radiation Oncology         [phone] ________________________________  Name: [name] MRN: [id number]  Date: 10/24/2013  DOB: 1936-06-23  Follow-Up Visit Note  CC: [name], MD  Curt Bears, MD  Diagnosis:   Multiple myeloma  Interval Since Last Radiation:  1  months  Narrative:  The patient returns today for routine follow-up.  She has had improvement in her low back and sacrum pain. Does complain of some pain in the bilateral hip area. She denies any weakness in her lower extremities bowel or bladder incontinence.                              ALLERGIES:  has No Known Allergies.  Meds: Current Outpatient Prescriptions  Medication Sig Dispense Refill  . acyclovir (ZOVIRAX) 400 MG tablet Take 400 mg by mouth 2 (two) times daily.      Marland Kitchen ALPRAZolam (XANAX) 0.5 MG tablet Take 0.5 mg by mouth 2 (two) times daily as needed for anxiety.      . Calcium Carbonate-Vitamin D 600-400 MG-UNIT per tablet Take 1 tablet by mouth 3 (three) times daily with meals.        Marland Kitchen dexamethasone (DECADRON) 4 MG tablet TAKE 10 TABLETS BY MOUTH ONCE PER WEEK  40 tablet  0  . gabapentin (NEURONTIN) 100 MG capsule Take 100 mg by mouth daily.      Marland Kitchen glipiZIDE (GLUCOTROL XL) 2.5 MG 24 hr tablet Take 1 tablet (2.5 mg total) by mouth daily.  90 tablet  3  . glucose blood (ONE TOUCH ULTRA TEST) test strip Use as instructed  25 each  6  . guaiFENesin (MUCINEX) 600 MG 12 hr tablet Take 1 tablet (600 mg total) by mouth 2 (two) times daily.  30 tablet  0  . Lancets (ONETOUCH ULTRASOFT) lancets Test blood sugar once daily or as directed.  100 each  11  . lisinopril-hydrochlorothiazide (PRINZIDE,ZESTORETIC) 20-12.5 MG per tablet TAKE ONE-HALF TABLET BY MOUTH EVERY DAY  45 tablet  1  . loperamide (IMODIUM A-D) 2 MG tablet Take 1 tablet (2 mg total) by mouth 4 (four) times daily as needed for diarrhea or loose stools.      Marland Kitchen loratadine (CLARITIN) 10 MG tablet Take 1 tablet (10 mg total) by mouth daily.  30  tablet  0  . OxyCODONE (OXYCONTIN) 10 mg T12A 12 hr tablet Take 1 tablet (10 mg total) by mouth every 12 (twelve) hours.  60 tablet  0  . pomalidomide (POMALYST) 4 MG capsule Take 1 capsule (4 mg total) by mouth daily. Take with water on days 1-21. Repeat every 28 days  21 capsule  0  . potassium chloride SA (K-DUR,KLOR-CON) 20 MEQ tablet Take 1 tablet (20 mEq total) by mouth daily as needed.  10 tablet  0  . predniSONE (DELTASONE) 20 MG tablet Take 20 mg by mouth once a week.      . prochlorperazine (COMPAZINE) 10 MG tablet Take 1 tablet (10 mg total) by mouth every 6 (six) hours as needed.  30 tablet  1  . saccharomyces boulardii (FLORASTOR) 250 MG capsule Take 1 capsule (250 mg total) by mouth 2 (two) times daily.  30 capsule  0  . temazepam (RESTORIL) 30 MG capsule TAKE ONE CAPSULE BY MOUTH AT BEDTIME AS NEEDED  30 capsule  0  . warfarin (COUMADIN) 5 MG tablet Take 2.5-5 mg by mouth daily at 6 PM. Take  one by mouth daily on Monday & Wednesday. Take 1/2 tablet on all other days or as directed      . oxyCODONE (OXY IR/ROXICODONE) 5 MG immediate release tablet Take 0.5-1 tablets (2.5-5 mg total) by mouth every 6 (six) hours as needed for severe pain or breakthrough pain. Take 1 1/2 tabs by mouth every 6 hours as needed for pain  90 tablet  0   No current facility-administered medications for this encounter.   Facility-Administered Medications Ordered in Other Encounters  Medication Dose Route Frequency Provider Last Rate Last Dose  . Zoledronic Acid (ZOMETA) 4 mg IVPB  4 mg Intravenous Once Curt Bears, MD        Physical Findings: The patient is in no acute distress. Patient is alert and oriented.  height is $RemoveB'4\' 10"'cxpwtlhL$  (1.473 m) and weight is 131 lb (59.421 kg). Her temperature is 98.7 F (37.1 C). Her blood pressure is 145/61 and her pulse is 80. Her oxygen saturation is 99%. .  The lungs are clear. The heart has a regular rhythm and rate. The abdomen is soft and nontender with normal bowel  sounds. Lower motor strength is 5 out of 5 in proximal and distal muscle groups.  Lab Findings: Lab Results  Component Value Date   WBC 4.9 10/19/2013   HGB 11.9 10/19/2013   HCT 37.0 10/19/2013   MCV 98.6 10/19/2013   PLT 295 10/19/2013      Radiographic Findings: No results found.  Impression:  The patient is recovering from the effects of radiation.  Her low back and sacrum pain has improved with her treatments. If she complains of continued pain in the hip region, she may require a MRI of the pelvis for further evaluation.  It has been sometime since her last imaging of this region  Plan:  When necessary followup in radiation oncology. The patient will continue close followup in medical oncology.  ____________________________________ Blair Promise, MD

## 2013-10-24 NOTE — Progress Notes (Signed)
[name] here for follow up after treatment to her lower back/sacrum.  She is having pain in both of her hips that she rates a 4/10.  She says she has pain in her lower back at times but states it is better after radiation.  She is taking oxycodone 5 mg once a day.  She taked decadron 40 mg once a week on Friday's.  She reports having loose stools once a day.  She denies nausea.  She reports fatigue.

## 2013-10-26 ENCOUNTER — Other Ambulatory Visit: Payer: Self-pay | Admitting: Internal Medicine

## 2013-10-28 NOTE — Telephone Encounter (Signed)
Received confirmation fax from [facility] stating that pt pomalyst has been shipped 10/26/13 for delivery nex business day.

## 2013-10-31 ENCOUNTER — Ambulatory Visit (HOSPITAL_BASED_OUTPATIENT_CLINIC_OR_DEPARTMENT_OTHER): Payer: Medicare Other

## 2013-10-31 ENCOUNTER — Other Ambulatory Visit: Payer: Self-pay | Admitting: Medical Oncology

## 2013-10-31 ENCOUNTER — Ambulatory Visit (HOSPITAL_BASED_OUTPATIENT_CLINIC_OR_DEPARTMENT_OTHER): Payer: Medicare Other | Admitting: Pharmacist

## 2013-10-31 VITALS — BP 166/76 | HR 63 | Temp 98.3°F | Resp 18

## 2013-10-31 DIAGNOSIS — C9000 Multiple myeloma not having achieved remission: Secondary | ICD-10-CM

## 2013-10-31 DIAGNOSIS — I82401 Acute embolism and thrombosis of unspecified deep veins of right lower extremity: Secondary | ICD-10-CM

## 2013-10-31 DIAGNOSIS — I82409 Acute embolism and thrombosis of unspecified deep veins of unspecified lower extremity: Secondary | ICD-10-CM

## 2013-10-31 LAB — POCT INR: INR: 2

## 2013-10-31 LAB — PROTIME-INR
INR: 2 (ref 2.00–3.50)
Protime: 24 Seconds — ABNORMAL HIGH (ref 10.6–13.4)

## 2013-10-31 LAB — CBC WITH DIFFERENTIAL/PLATELET
BASO%: 0.1 % (ref 0.0–2.0)
BASOS ABS: 0 10*3/uL (ref 0.0–0.1)
EOS%: 2.8 % (ref 0.0–7.0)
Eosinophils Absolute: 0.2 10*3/uL (ref 0.0–0.5)
HEMATOCRIT: 35.2 % (ref 34.8–46.6)
HEMOGLOBIN: 11.3 g/dL — AB (ref 11.6–15.9)
LYMPH#: 0.9 10*3/uL (ref 0.9–3.3)
LYMPH%: 13.9 % — ABNORMAL LOW (ref 14.0–49.7)
MCH: 31.4 pg (ref 25.1–34.0)
MCHC: 32 g/dL (ref 31.5–36.0)
MCV: 98.2 fL (ref 79.5–101.0)
MONO#: 1 10*3/uL — AB (ref 0.1–0.9)
MONO%: 15.2 % — ABNORMAL HIGH (ref 0.0–14.0)
NEUT%: 68 % (ref 38.4–76.8)
NEUTROS ABS: 4.5 10*3/uL (ref 1.5–6.5)
Platelets: 261 10*3/uL (ref 145–400)
RBC: 3.59 10*6/uL — ABNORMAL LOW (ref 3.70–5.45)
RDW: 16.6 % — ABNORMAL HIGH (ref 11.2–14.5)
WBC: 6.6 10*3/uL (ref 3.9–10.3)

## 2013-10-31 LAB — LACTATE DEHYDROGENASE (CC13): LDH: 271 U/L — ABNORMAL HIGH (ref 125–245)

## 2013-10-31 LAB — COMPREHENSIVE METABOLIC PANEL (CC13)
ALBUMIN: 3.2 g/dL — AB (ref 3.5–5.0)
ALT: 19 U/L (ref 0–55)
AST: 17 U/L (ref 5–34)
Alkaline Phosphatase: 53 U/L (ref 40–150)
Anion Gap: 7 mEq/L (ref 3–11)
BUN: 14.6 mg/dL (ref 7.0–26.0)
CHLORIDE: 106 meq/L (ref 98–109)
CO2: 31 mEq/L — ABNORMAL HIGH (ref 22–29)
Calcium: 9.9 mg/dL (ref 8.4–10.4)
Creatinine: 1 mg/dL (ref 0.6–1.1)
Glucose: 122 mg/dl (ref 70–140)
POTASSIUM: 3.1 meq/L — AB (ref 3.5–5.1)
Sodium: 145 mEq/L (ref 136–145)
Total Bilirubin: 0.33 mg/dL (ref 0.20–1.20)
Total Protein: 6.1 g/dL — ABNORMAL LOW (ref 6.4–8.3)

## 2013-10-31 MED ORDER — OXYCODONE HCL 5 MG PO TABS: 2.5000 mg | ORAL_TABLET | Freq: Four times a day (QID) | ORAL | Status: DC | PRN

## 2013-10-31 MED ORDER — SODIUM CHLORIDE 0.9 % IV SOLN
Freq: Once | INTRAVENOUS | Status: AC
  Administered 2013-10-31: 16:00:00 via INTRAVENOUS

## 2013-10-31 MED ORDER — ZOLEDRONIC ACID 4 MG/100ML IV SOLN
4.0000 mg | Freq: Once | INTRAVENOUS | Status: AC
  Administered 2013-10-31: 4 mg via INTRAVENOUS
  Filled 2013-10-31: qty 100

## 2013-10-31 NOTE — Telephone Encounter (Signed)
Daughter requested refill. Locked rx in injection room.

## 2013-10-31 NOTE — Progress Notes (Signed)
INR = 2 on Coumadin 2.5 mg daily except 5 mg Mon/Wed No med changes recently. No complaints re: anticoag. No recent missed doses of Coumadin. INR at goal.  Continue same dose. Return in 4 weeks. Kennith Center, Pharm.D., CPP 10/31/2013@3 :52 PM

## 2013-10-31 NOTE — Patient Instructions (Signed)
Zoledronic Acid injection (Hypercalcemia, Oncology) - Zometa What is this medicine? ZOLEDRONIC ACID (ZOE le dron ik AS id) lowers the amount of calcium loss from bone. It is used to treat too much calcium in your blood from cancer. It is also used to prevent complications of cancer that has spread to the bone. This medicine may be used for other purposes; ask your health care provider or pharmacist if you have questions. COMMON BRAND NAME(S): Zometa What should I tell my health care provider before I take this medicine? They need to know if you have any of these conditions: -aspirin-sensitive asthma -cancer, especially if you are receiving medicines used to treat cancer -dental disease or wear dentures -infection -kidney disease -receiving corticosteroids like dexamethasone or prednisone -an unusual or allergic reaction to zoledronic acid, other medicines, foods, dyes, or preservatives -pregnant or trying to get pregnant -breast-feeding How should I use this medicine? This medicine is for infusion into a vein. It is given by a health care professional in a hospital or clinic setting. Talk to your pediatrician regarding the use of this medicine in children. Special care may be needed. Overdosage: If you think you have taken too much of this medicine contact a poison control center or emergency room at once. NOTE: This medicine is only for you. Do not share this medicine with others. What if I miss a dose? It is important not to miss your dose. Call your doctor or health care professional if you are unable to keep an appointment. What may interact with this medicine? -certain antibiotics given by injection -NSAIDs, medicines for pain and inflammation, like ibuprofen or naproxen -some diuretics like bumetanide, furosemide -teriparatide -thalidomide This list may not describe all possible interactions. Give your health care provider a list of all the medicines, herbs, non-prescription drugs,  or dietary supplements you use. Also tell them if you smoke, drink alcohol, or use illegal drugs. Some items may interact with your medicine. What should I watch for while using this medicine? Visit your doctor or health care professional for regular checkups. It may be some time before you see the benefit from this medicine. Do not stop taking your medicine unless your doctor tells you to. Your doctor may order blood tests or other tests to see how you are doing. Women should inform their doctor if they wish to become pregnant or think they might be pregnant. There is a potential for serious side effects to an unborn child. Talk to your health care professional or pharmacist for more information. You should make sure that you get enough calcium and vitamin D while you are taking this medicine. Discuss the foods you eat and the vitamins you take with your health care professional. Some people who take this medicine have severe bone, joint, and/or muscle pain. This medicine may also increase your risk for jaw problems or a broken thigh bone. Tell your doctor right away if you have severe pain in your jaw, bones, joints, or muscles. Tell your doctor if you have any pain that does not go away or that gets worse. Tell your dentist and dental surgeon that you are taking this medicine. You should not have major dental surgery while on this medicine. See your dentist to have a dental exam and fix any dental problems before starting this medicine. Take good care of your teeth while on this medicine. Make sure you see your dentist for regular follow-up appointments. What side effects may I notice from receiving this medicine? Side  effects that you should report to your doctor or health care professional as soon as possible: -allergic reactions like skin rash, itching or hives, swelling of the face, lips, or tongue -anxiety, confusion, or depression -breathing problems -changes in vision -eye pain -feeling faint  or lightheaded, falls -jaw pain, especially after dental work -mouth sores -muscle cramps, stiffness, or weakness -trouble passing urine or change in the amount of urine Side effects that usually do not require medical attention (report to your doctor or health care professional if they continue or are bothersome): -bone, joint, or muscle pain -constipation -diarrhea -fever -hair loss -irritation at site where injected -loss of appetite -nausea, vomiting -stomach upset -trouble sleeping -trouble swallowing -weak or tired This list may not describe all possible side effects. Call your doctor for medical advice about side effects. You may report side effects to FDA at 1-800-FDA-1088. Where should I keep my medicine? This drug is given in a hospital or clinic and will not be stored at home. NOTE: This sheet is a summary. It may not cover all possible information. If you have questions about this medicine, talk to your doctor, pharmacist, or health care provider.  2014, [employer]. (2013-01-27 13:03:13)

## 2013-11-04 ENCOUNTER — Other Ambulatory Visit: Payer: Self-pay | Admitting: Internal Medicine

## 2013-11-07 ENCOUNTER — Other Ambulatory Visit: Payer: Self-pay | Admitting: Internal Medicine

## 2013-11-11 ENCOUNTER — Telehealth: Payer: Self-pay | Admitting: *Deleted

## 2013-11-11 DIAGNOSIS — E876 Hypokalemia: Secondary | ICD-10-CM

## 2013-11-11 MED ORDER — POTASSIUM CHLORIDE CRYS ER 20 MEQ PO TBCR: 20.0000 meq | EXTENDED_RELEASE_TABLET | Freq: Every day | ORAL | Status: DC

## 2013-11-11 NOTE — Telephone Encounter (Signed)
Message copied by Norma Fredrickson on Fri Nov 11, 2013  3:16 PM ------      Message from: Carlton Adam      Created: Fri Nov 11, 2013  3:05 PM       Abnormal results, please call in following prescription and notify patient  Potassium is low. KCl 20 meq by mouth daily for 7 days ------

## 2013-11-11 NOTE — Telephone Encounter (Signed)
Called patient with lab results and informed a prescription for potassium will be sent to her pharmacy.  Per Awilda Metro.  Patient verbalized understanding.

## 2013-11-16 ENCOUNTER — Ambulatory Visit (HOSPITAL_BASED_OUTPATIENT_CLINIC_OR_DEPARTMENT_OTHER): Payer: Medicare Other | Admitting: Internal Medicine

## 2013-11-16 ENCOUNTER — Encounter: Payer: Self-pay | Admitting: Internal Medicine

## 2013-11-16 ENCOUNTER — Other Ambulatory Visit (HOSPITAL_BASED_OUTPATIENT_CLINIC_OR_DEPARTMENT_OTHER): Payer: Medicare Other

## 2013-11-16 ENCOUNTER — Telehealth: Payer: Self-pay | Admitting: Internal Medicine

## 2013-11-16 VITALS — BP 131/55 | HR 76 | Temp 97.9°F | Resp 17 | Ht <= 58 in | Wt 128.8 lb

## 2013-11-16 DIAGNOSIS — Z5181 Encounter for therapeutic drug level monitoring: Secondary | ICD-10-CM

## 2013-11-16 DIAGNOSIS — I82401 Acute embolism and thrombosis of unspecified deep veins of right lower extremity: Secondary | ICD-10-CM

## 2013-11-16 DIAGNOSIS — Z86718 Personal history of other venous thrombosis and embolism: Secondary | ICD-10-CM

## 2013-11-16 DIAGNOSIS — Z7901 Long term (current) use of anticoagulants: Secondary | ICD-10-CM

## 2013-11-16 DIAGNOSIS — C9000 Multiple myeloma not having achieved remission: Secondary | ICD-10-CM

## 2013-11-16 LAB — PROTIME-INR
INR: 2.3 (ref 2.00–3.50)
Protime: 27.6 Seconds — ABNORMAL HIGH (ref 10.6–13.4)

## 2013-11-16 NOTE — Progress Notes (Signed)
[place] Telephone:(336) [phone]   Fax:(336) [phone]  OFFICE PROGRESS NOTE  Lindsay Stain, MD [place] [address]  DIAGNOSIS:  1. multiple myeloma diagnosed in September 2009,  2. history of bilateral lower extremity deep vein thrombosis diagnosed in November 2009  3. acute on chronic deep vein thrombosis in the left lower extremity diagnosed in October 2010.   PRIOR THERAPY:  1. status post palliative radiotherapy to the right femur and is she'll tuberosity, first was done in December 2011 and the second treatment was completed Jan 08 2011, and the third treatment to the right distal femur completed on 04/30/2011  2. status post palliative radiotherapy to the left proximal femur completed 02/28/2011.  3. Revlimid 25 mg by mouth daily for 21 days every 4 weeks in addition to oral Decadron at 40 mg by mouth on a weekly basis status post 40 cycles.  4. weekly subcutaneous Velcade 1.3 mg/M2 status post 60 cycles, discontinued today secondary to mild disease progression.   CURRENT THERAPY:  1. Pomalyst 4 mg by mouth daily for 21 days every 4 weeks, the patient started the first dose on 12/22/2012 , in addition to Decadron 40 mg weekly. Status post 7 cycles. She started cycle #10  on 03/10//2015. 2. Coumadin 5 mg on Mondays and Thursdays and 2.5 mg on all other days.  3. Zometa 4 mg IV given every 2 months.    INTERVAL HISTORY: [name] 78 y.o. female returns to the clinic today for followup visit accompanied by her  2 daughters. She is tolerating her treatment with Pomalyst fairly well. She denied having any significant nausea or vomiting. The patient denied having any significant weight loss or night sweats. She has no chest pain, shortness of breath, cough or hemoptysis. She continues to have the low back pain with radiation to the lower extremity mainly in the form of neuropathy. She is currently on Neurontin 100 mg by mouth 3 times a  day.  MEDICAL HISTORY: Past Medical History  Diagnosis Date  . Blood transfusion   . Depression     Mild  . History of chicken pox   . Hyperlipidemia   . Hypertension   . Degenerative joint disease   . Phlebitis   . Multiple myeloma     per Dr. Julien Nordmann, s/p palliative radiation for leg pain 2011 per Dr. Sondra Come  . Deep vein thrombosis of bilateral lower extremities   . Family history of anesthesia complication     DAUGHTER CPR AFTER  . Cardiomyopathy   . Diabetes mellitus     Steroid related  . UTI (urinary tract infection) 09/08/2013  . History of radiation therapy 08/30/13-09/20/13    20 gray to lower lumbar/upper sacrum    ALLERGIES:  has No Known Allergies.  MEDICATIONS:  Current Outpatient Prescriptions  Medication Sig Dispense Refill  . acyclovir (ZOVIRAX) 400 MG tablet Take 400 mg by mouth 2 (two) times daily.      Marland Kitchen ALPRAZolam (XANAX) 0.5 MG tablet TAKE ONE TABLET BY MOUTH TWICE DAILY AS NEEDED FOR ANXIETY  60 tablet  0  . Calcium Carbonate-Vitamin D 600-400 MG-UNIT per tablet Take 1 tablet by mouth 3 (three) times daily with meals.        Marland Kitchen dexamethasone (DECADRON) 4 MG tablet TAKE 10 TABLETS BY MOUTH ONCE PER WEEK  40 tablet  0  . gabapentin (NEURONTIN) 100 MG capsule Take 100 mg by mouth daily.      Marland Kitchen  glipiZIDE (GLUCOTROL XL) 2.5 MG 24 hr tablet Take 1 tablet (2.5 mg total) by mouth daily.  90 tablet  3  . glucose blood (ONE TOUCH ULTRA TEST) test strip Use as instructed  25 each  6  . guaiFENesin (MUCINEX) 600 MG 12 hr tablet Take 1 tablet (600 mg total) by mouth 2 (two) times daily.  30 tablet  0  . Lancets (ONETOUCH ULTRASOFT) lancets Test blood sugar once daily or as directed.  100 each  11  . lisinopril-hydrochlorothiazide (PRINZIDE,ZESTORETIC) 20-12.5 MG per tablet TAKE ONE-HALF TABLET BY MOUTH EVERY DAY  45 tablet  1  . loperamide (IMODIUM A-D) 2 MG tablet Take 1 tablet (2 mg total) by mouth 4 (four) times daily as needed for diarrhea or loose stools.      Marland Kitchen  loratadine (CLARITIN) 10 MG tablet Take 1 tablet (10 mg total) by mouth daily.  30 tablet  0  . oxyCODONE (OXY IR/ROXICODONE) 5 MG immediate release tablet Take 0.5-1 tablets (2.5-5 mg total) by mouth every 6 (six) hours as needed for severe pain or breakthrough pain. Take 1 1/2 tabs by mouth every 6 hours as needed for pain  90 tablet  0  . OxyCODONE (OXYCONTIN) 10 mg T12A 12 hr tablet Take 1 tablet (10 mg total) by mouth every 12 (twelve) hours.  60 tablet  0  . pomalidomide (POMALYST) 4 MG capsule Take 1 capsule (4 mg total) by mouth daily. Take with water on days 1-21. Repeat every 28 days  21 capsule  0  . potassium chloride SA (K-DUR,KLOR-CON) 20 MEQ tablet Take 1 tablet (20 mEq total) by mouth daily.  7 tablet  0  . predniSONE (DELTASONE) 20 MG tablet Take 20 mg by mouth once a week.      . prochlorperazine (COMPAZINE) 10 MG tablet Take 1 tablet (10 mg total) by mouth every 6 (six) hours as needed.  30 tablet  1  . saccharomyces boulardii (FLORASTOR) 250 MG capsule Take 1 capsule (250 mg total) by mouth 2 (two) times daily.  30 capsule  0  . temazepam (RESTORIL) 30 MG capsule TAKE ONE CAPSULE BY MOUTH AT BEDTIME AS NEEDED  30 capsule  0  . warfarin (COUMADIN) 5 MG tablet Take 2.5-5 mg by mouth daily at 6 PM. Take one by mouth daily on Monday & Wednesday. Take 1/2 tablet on all other days or as directed       No current facility-administered medications for this visit.   Facility-Administered Medications Ordered in Other Visits  Medication Dose Route Frequency Provider Last Rate Last Dose  . Zoledronic Acid (ZOMETA) 4 mg IVPB  4 mg Intravenous Once Curt Bears, MD        SURGICAL HISTORY:  Past Surgical History  Procedure Laterality Date  . Abdominal hysterectomy    . Ankle fracture surgery Left     REVIEW OF SYSTEMS:  Constitutional: positive for fatigue Eyes: negative Ears, nose, mouth, throat, and face: negative Respiratory: negative Cardiovascular:  negative Gastrointestinal: negative Genitourinary:negative Integument/breast: negative Hematologic/lymphatic: negative Musculoskeletal:positive for back pain Neurological: positive for paresthesia Behavioral/Psych: negative Endocrine: negative Allergic/Immunologic: negative   PHYSICAL EXAMINATION: General appearance: alert, cooperative, fatigued and no distress Head: Normocephalic, without obvious abnormality, atraumatic Neck: no adenopathy, no JVD, supple, symmetrical, trachea midline and thyroid not enlarged, symmetric, no tenderness/mass/nodules Lymph nodes: Cervical, supraclavicular, and axillary nodes normal. Resp: clear to auscultation bilaterally Back: symmetric, no curvature. ROM normal. No CVA tenderness. Cardio: regular rate and rhythm, S1, S2 normal,  no murmur, click, rub or gallop GI: soft, non-tender; bowel sounds normal; no masses,  no organomegaly Extremities: extremities normal, atraumatic, no cyanosis or edema Neurologic: Alert and oriented X 3, normal strength and tone. Normal symmetric reflexes. Normal coordination and gait  ECOG PERFORMANCE STATUS: 2 - Symptomatic, <50% confined to bed  Blood pressure 131/55, pulse 76, temperature 97.9 F (36.6 C), temperature source Oral, resp. rate 17, height $RemoveBe'4\' 10"'UgFcUhaCy$  (1.473 m), weight 128 lb 12.8 oz (58.423 kg), SpO2 97.00%.  LABORATORY DATA: Lab Results  Component Value Date   WBC 6.6 10/31/2013   HGB 11.3* 10/31/2013   HCT 35.2 10/31/2013   MCV 98.2 10/31/2013   PLT 261 10/31/2013      Chemistry      Component Value Date/Time   NA 145 10/31/2013 1449   NA 142 09/09/2013 0608   K 3.1* 10/31/2013 1449   K 4.1 09/09/2013 0608   CL 110 09/09/2013 0608   CL 107 02/14/2013 1311   CO2 31* 10/31/2013 1449   CO2 20 09/09/2013 0608   BUN 14.6 10/31/2013 1449   BUN 15 09/09/2013 0608   CREATININE 1.0 10/31/2013 1449   CREATININE 0.97 09/09/2013 0608      Component Value Date/Time   CALCIUM 9.9 10/31/2013 1449   CALCIUM 8.3* 09/09/2013 0608   CALCIUM   Value: 5.7 CRITICAL RESULT CALLED TO, READ BACK BY AND VERIFIED WITH: [place] [id number] @ 0626 [place] (NOTE)  Amended report. Result repeated and verified. CORRECTED ON 10/14 AT 1429: PREVIOUSLY REPORTED AS Result repeated and verified.* 06/13/2008 1605   ALKPHOS 53 10/31/2013 1449   ALKPHOS 44 09/08/2013 0105   AST 17 10/31/2013 1449   AST 21 09/08/2013 0105   ALT 19 10/31/2013 1449   ALT 15 09/08/2013 0105   BILITOT 0.33 10/31/2013 1449   BILITOT 0.4 09/08/2013 0105       RADIOGRAPHIC STUDIES: No results found.  ASSESSMENT AND PLAN: This is a very pleasant 78 years old African American female with multiple myeloma currently treatment was Pomalyst 4 mg by mouth daily for 21 days every 4 weeks status post 9 cycles. The patient is tolerating her treatment fairly well.  I recommended for her to continue on Pomalyst with the same dose.  I would see her back for followup visit in one month after repeating myeloma panel for evaluation of her disease. She will also continue on Zometa every 2 months as scheduled. She would come back for followup visit in one month for reevaluation with repeat CBC, comprehensive metabolic panel and LDH. The patient voices understanding of current disease status and treatment options and is in agreement with the current care plan.  All questions were answered. The patient knows to call the clinic with any problems, questions or concerns. We can certainly see the patient much sooner if necessary.  Disclaimer: This note was dictated with voice recognition software. Similar sounding words can inadvertently be transcribed and may not be corrected upon review.

## 2013-11-16 NOTE — Telephone Encounter (Signed)
gv adn printed appt sched and avs for pt for April...sed added tx.Lindsay KitchenMarland [name]

## 2013-11-21 ENCOUNTER — Other Ambulatory Visit: Payer: Self-pay | Admitting: Medical Oncology

## 2013-11-21 DIAGNOSIS — C9000 Multiple myeloma not having achieved remission: Secondary | ICD-10-CM

## 2013-11-21 MED ORDER — OXYCODONE HCL 5 MG PO TABS: 2.5000 mg | ORAL_TABLET | Freq: Four times a day (QID) | ORAL | Status: DC | PRN

## 2013-11-21 NOTE — Telephone Encounter (Signed)
refill for oxycodone locked in injection.

## 2013-11-25 ENCOUNTER — Other Ambulatory Visit: Payer: Self-pay | Admitting: Internal Medicine

## 2013-11-28 ENCOUNTER — Other Ambulatory Visit: Payer: Self-pay | Admitting: *Deleted

## 2013-11-28 DIAGNOSIS — C9000 Multiple myeloma not having achieved remission: Secondary | ICD-10-CM

## 2013-11-28 NOTE — Telephone Encounter (Signed)
THIS REFILL REQUEST FOR POMALYST WAS GIVEN TO DR.MOHAMED'S NURSE, STEPHANIE JOHNSON,RN.

## 2013-11-29 MED ORDER — POMALIDOMIDE 4 MG PO CAPS: 4.0000 mg | ORAL_CAPSULE | Freq: Every day | ORAL | Status: DC

## 2013-11-29 NOTE — Telephone Encounter (Signed)
RECEIVED A FAX FROM [facility]CERNING A CONFIRMATION OF FACSIMILE RECEIPT FOR PT. REFERRAL. 

## 2013-11-29 NOTE — Addendum Note (Signed)
Addended by: Wyonia Hough on: 11/29/2013 11:28 AM   Modules accepted: Orders

## 2013-11-30 ENCOUNTER — Other Ambulatory Visit: Payer: Self-pay | Admitting: *Deleted

## 2013-11-30 ENCOUNTER — Other Ambulatory Visit: Payer: Self-pay

## 2013-11-30 DIAGNOSIS — C9000 Multiple myeloma not having achieved remission: Secondary | ICD-10-CM

## 2013-11-30 MED ORDER — OXYCODONE HCL ER 10 MG PO T12A: 10.0000 mg | EXTENDED_RELEASE_TABLET | Freq: Two times a day (BID) | ORAL | Status: DC

## 2013-12-01 ENCOUNTER — Ambulatory Visit (HOSPITAL_BASED_OUTPATIENT_CLINIC_OR_DEPARTMENT_OTHER): Payer: Medicare Other | Admitting: Pharmacist

## 2013-12-01 ENCOUNTER — Other Ambulatory Visit (HOSPITAL_BASED_OUTPATIENT_CLINIC_OR_DEPARTMENT_OTHER): Payer: Medicare Other

## 2013-12-01 ENCOUNTER — Telehealth: Payer: Self-pay | Admitting: *Deleted

## 2013-12-01 ENCOUNTER — Ambulatory Visit: Payer: Medicare Other

## 2013-12-01 ENCOUNTER — Other Ambulatory Visit: Payer: Self-pay | Admitting: Internal Medicine

## 2013-12-01 DIAGNOSIS — I82409 Acute embolism and thrombosis of unspecified deep veins of unspecified lower extremity: Secondary | ICD-10-CM

## 2013-12-01 DIAGNOSIS — I82401 Acute embolism and thrombosis of unspecified deep veins of right lower extremity: Secondary | ICD-10-CM

## 2013-12-01 LAB — PROTIME-INR
INR: 2 (ref 2.00–3.50)
PROTIME: 24 s — AB (ref 10.6–13.4)

## 2013-12-01 LAB — POCT INR: INR: 2

## 2013-12-01 NOTE — Telephone Encounter (Signed)
RECEIVED A FAX FROM [facility]CERNING A CONFIRMATION OF PRESCRIPTION SHIPMENT FOR POMALYST ON 11/30/13.

## 2013-12-01 NOTE — Progress Notes (Signed)
INR within goal today. Pt doing well. No concerns regarding anticoagulation. No s/s of clotting noted. No changes in diet or medications. No missed doses. Continue coumadin 2.5mg  daily except 5mg  on Mon&Wed.   Recheck PT/INR in 4 weeks on 01/02/14: lab at 2 pm, infusion at 2:45pm and coumadin clinic at 3pm.

## 2013-12-01 NOTE — Patient Instructions (Signed)
Continue coumadin 2.5mg  daily except 5mg  on Mon&Wed.   Recheck PT/INR in 4 weeks on 01/02/14: lab at 2 pm, infusion at 2:45pm and coumadin clinic at 3pm.

## 2013-12-01 NOTE — Telephone Encounter (Signed)
Per 4/1 POF I have scheduled appts. Called and left patient a message to pick up schedule today after her appt.  JMW

## 2013-12-07 ENCOUNTER — Other Ambulatory Visit (HOSPITAL_BASED_OUTPATIENT_CLINIC_OR_DEPARTMENT_OTHER): Payer: Medicare Other

## 2013-12-07 DIAGNOSIS — C9000 Multiple myeloma not having achieved remission: Secondary | ICD-10-CM

## 2013-12-07 LAB — COMPREHENSIVE METABOLIC PANEL (CC13)
ALBUMIN: 3.1 g/dL — AB (ref 3.5–5.0)
ALT: 16 U/L (ref 0–55)
AST: 14 U/L (ref 5–34)
Alkaline Phosphatase: 48 U/L (ref 40–150)
Anion Gap: 8 mEq/L (ref 3–11)
BUN: 14.6 mg/dL (ref 7.0–26.0)
CO2: 25 mEq/L (ref 22–29)
Calcium: 9 mg/dL (ref 8.4–10.4)
Chloride: 109 mEq/L (ref 98–109)
Creatinine: 0.8 mg/dL (ref 0.6–1.1)
Glucose: 153 mg/dl — ABNORMAL HIGH (ref 70–140)
POTASSIUM: 3.5 meq/L (ref 3.5–5.1)
SODIUM: 142 meq/L (ref 136–145)
TOTAL PROTEIN: 5.8 g/dL — AB (ref 6.4–8.3)
Total Bilirubin: 0.23 mg/dL (ref 0.20–1.20)

## 2013-12-07 LAB — CBC WITH DIFFERENTIAL/PLATELET
BASO%: 2.3 % — ABNORMAL HIGH (ref 0.0–2.0)
Basophils Absolute: 0.1 10*3/uL (ref 0.0–0.1)
EOS%: 0.8 % (ref 0.0–7.0)
Eosinophils Absolute: 0 10*3/uL (ref 0.0–0.5)
HCT: 33.7 % — ABNORMAL LOW (ref 34.8–46.6)
HGB: 10.8 g/dL — ABNORMAL LOW (ref 11.6–15.9)
LYMPH#: 0.6 10*3/uL — AB (ref 0.9–3.3)
LYMPH%: 11.1 % — ABNORMAL LOW (ref 14.0–49.7)
MCH: 31.4 pg (ref 25.1–34.0)
MCHC: 32.1 g/dL (ref 31.5–36.0)
MCV: 97.8 fL (ref 79.5–101.0)
MONO#: 0.8 10*3/uL (ref 0.1–0.9)
MONO%: 15.3 % — ABNORMAL HIGH (ref 0.0–14.0)
NEUT#: 3.7 10*3/uL (ref 1.5–6.5)
NEUT%: 70.5 % (ref 38.4–76.8)
Platelets: 309 10*3/uL (ref 145–400)
RBC: 3.44 10*6/uL — ABNORMAL LOW (ref 3.70–5.45)
RDW: 16.2 % — AB (ref 11.2–14.5)
WBC: 5.2 10*3/uL (ref 3.9–10.3)

## 2013-12-07 LAB — LACTATE DEHYDROGENASE (CC13): LDH: 241 U/L (ref 125–245)

## 2013-12-08 ENCOUNTER — Other Ambulatory Visit: Payer: Self-pay | Admitting: *Deleted

## 2013-12-08 DIAGNOSIS — C9000 Multiple myeloma not having achieved remission: Secondary | ICD-10-CM

## 2013-12-08 MED ORDER — OXYCODONE HCL 5 MG PO TABS: 2.5000 mg | ORAL_TABLET | Freq: Four times a day (QID) | ORAL | Status: DC | PRN

## 2013-12-09 ENCOUNTER — Other Ambulatory Visit: Payer: Self-pay | Admitting: Internal Medicine

## 2013-12-09 DIAGNOSIS — C9000 Multiple myeloma not having achieved remission: Secondary | ICD-10-CM

## 2013-12-09 LAB — KAPPA/LAMBDA LIGHT CHAINS
KAPPA LAMBDA RATIO: 0.05 — AB (ref 0.26–1.65)
Kappa free light chain: 0.76 mg/dL (ref 0.33–1.94)
Lambda Free Lght Chn: 14.5 mg/dL — ABNORMAL HIGH (ref 0.57–2.63)

## 2013-12-09 LAB — IGG, IGA, IGM
IGA: 81 mg/dL (ref 69–380)
IGG (IMMUNOGLOBIN G), SERUM: 390 mg/dL — AB (ref 690–1700)
IgM, Serum: 111 mg/dL (ref 52–322)

## 2013-12-09 LAB — BETA 2 MICROGLOBULIN, SERUM: BETA 2 MICROGLOBULIN: 2.43 mg/L (ref <=2.51)

## 2013-12-12 ENCOUNTER — Other Ambulatory Visit: Payer: Self-pay

## 2013-12-12 DIAGNOSIS — Z1231 Encounter for screening mammogram for malignant neoplasm of breast: Secondary | ICD-10-CM

## 2013-12-14 ENCOUNTER — Ambulatory Visit (HOSPITAL_BASED_OUTPATIENT_CLINIC_OR_DEPARTMENT_OTHER): Payer: Medicare Other | Admitting: Internal Medicine

## 2013-12-14 ENCOUNTER — Encounter: Payer: Self-pay | Admitting: Internal Medicine

## 2013-12-14 ENCOUNTER — Telehealth: Payer: Self-pay | Admitting: Internal Medicine

## 2013-12-14 VITALS — BP 146/56 | HR 82 | Temp 97.9°F | Resp 17 | Ht <= 58 in | Wt 127.2 lb

## 2013-12-14 DIAGNOSIS — Z86718 Personal history of other venous thrombosis and embolism: Secondary | ICD-10-CM

## 2013-12-14 DIAGNOSIS — C9000 Multiple myeloma not having achieved remission: Secondary | ICD-10-CM

## 2013-12-14 NOTE — Telephone Encounter (Signed)
gv adn printed appt sched and avs foropt for May July OCT and Jan

## 2013-12-14 NOTE — Progress Notes (Signed)
[name] Telephone:([phone]   Fax:(336) [phone]  OFFICE PROGRESS NOTE  [name], MD [place] [address]  DIAGNOSIS:  1. multiple myeloma diagnosed in September 2009,  2. history of bilateral lower extremity deep vein thrombosis diagnosed in November 2009  3. acute on chronic deep vein thrombosis in the left lower extremity diagnosed in October 2010.   PRIOR THERAPY:  1. status post palliative radiotherapy to the right femur and is she'll tuberosity, first was done in December 2011 and the second treatment was completed Jan 08 2011, and the third treatment to the right distal femur completed on 04/30/2011  2. status post palliative radiotherapy to the left proximal femur completed 02/28/2011.  3. Revlimid 25 mg by mouth daily for 21 days every 4 weeks in addition to oral Decadron at 40 mg by mouth on a weekly basis status post 40 cycles.  4. weekly subcutaneous Velcade 1.3 mg/M2 status post 60 cycles, discontinued today secondary to mild disease progression.   CURRENT THERAPY:  1. Pomalyst 4 mg by mouth daily for 21 days every 4 weeks, the patient started the first dose on 12/22/2012 , in addition to Decadron 40 mg weekly. Status post 7 cycles. She started cycle #11  on 04/07//2015. 2. Coumadin 5 mg on Mondays and Thursdays and 2.5 mg on all other days.  3. Zometa 4 mg IV given every 3 months.    INTERVAL HISTORY: [name] 78 y.o. female returns to the clinic today for followup visit accompanied by her 2 daughters. She is tolerating her treatment with Pomalyst fairly well. She denied having any significant nausea or vomiting. The patient denied having any significant weight loss or night sweats. She has no chest pain, shortness of breath, cough or hemoptysis. She continues to have the low back pain with radiation to the lower extremity mainly in the form of neuropathy. She is currently on Neurontin 100 mg by mouth 3 times a  day. She had repeat myeloma panel performed recently and she is here for evaluation and discussion of her lab results.  MEDICAL HISTORY: Past Medical History  Diagnosis Date  . Blood transfusion   . Depression     Mild  . History of chicken pox   . Hyperlipidemia   . Hypertension   . Degenerative joint disease   . Phlebitis   . Multiple myeloma     per Dr. Julien Nordmann, s/p palliative radiation for leg pain 2011 per Dr. Sondra Come  . Deep vein thrombosis of bilateral lower extremities   . Family history of anesthesia complication     DAUGHTER CPR AFTER  . Cardiomyopathy   . Diabetes mellitus     Steroid related  . UTI (urinary tract infection) 09/08/2013  . History of radiation therapy 08/30/13-09/20/13    20 gray to lower lumbar/upper sacrum    ALLERGIES:  has No Known Allergies.  MEDICATIONS:  Current Outpatient Prescriptions  Medication Sig Dispense Refill  . ALPRAZolam (XANAX) 0.5 MG tablet TAKE ONE TABLET BY MOUTH TWICE DAILY AS NEEDED FOR ANXIETY  60 tablet  0  . Calcium Carbonate-Vitamin D 600-400 MG-UNIT per tablet Take 1 tablet by mouth 3 (three) times daily with meals.        Marland Kitchen dexamethasone (DECADRON) 4 MG tablet TAKE 10 TABLETS BY MOUTH ONCE PER WEEK  40 tablet  0  . gabapentin (NEURONTIN) 100 MG capsule Take 100 mg by mouth daily.      Marland Kitchen  glipiZIDE (GLUCOTROL XL) 2.5 MG 24 hr tablet Take 1 tablet (2.5 mg total) by mouth daily.  90 tablet  3  . glucose blood (ONE TOUCH ULTRA TEST) test strip Use as instructed  25 each  6  . guaiFENesin (MUCINEX) 600 MG 12 hr tablet Take 1 tablet (600 mg total) by mouth 2 (two) times daily.  30 tablet  0  . Lancets (ONETOUCH ULTRASOFT) lancets Test blood sugar once daily or as directed.  100 each  11  . lisinopril-hydrochlorothiazide (PRINZIDE,ZESTORETIC) 20-12.5 MG per tablet TAKE ONE-HALF TABLET BY MOUTH EVERY DAY  45 tablet  1  . loperamide (IMODIUM A-D) 2 MG tablet Take 1 tablet (2 mg total) by mouth 4 (four) times daily as needed for  diarrhea or loose stools.      Marland Kitchen loratadine (CLARITIN) 10 MG tablet Take 1 tablet (10 mg total) by mouth daily.  30 tablet  0  . oxyCODONE (OXY IR/ROXICODONE) 5 MG immediate release tablet Take 0.5-1 tablets (2.5-5 mg total) by mouth every 6 (six) hours as needed for severe pain or breakthrough pain. Take 1 1/2 tabs by mouth every 6 hours as needed for pain  90 tablet  0  . OxyCODONE (OXYCONTIN) 10 mg T12A 12 hr tablet Take 1 tablet (10 mg total) by mouth every 12 (twelve) hours.  60 tablet  0  . pomalidomide (POMALYST) 4 MG capsule Take 1 capsule (4 mg total) by mouth daily. Take with water on days 1-21. Repeat every 28 days  21 capsule  0  . potassium chloride SA (K-DUR,KLOR-CON) 20 MEQ tablet Take 1 tablet (20 mEq total) by mouth daily.  7 tablet  0  . predniSONE (DELTASONE) 20 MG tablet Take 20 mg by mouth once a week.      . prochlorperazine (COMPAZINE) 10 MG tablet Take 1 tablet (10 mg total) by mouth every 6 (six) hours as needed.  30 tablet  1  . saccharomyces boulardii (FLORASTOR) 250 MG capsule Take 1 capsule (250 mg total) by mouth 2 (two) times daily.  30 capsule  0  . temazepam (RESTORIL) 30 MG capsule TAKE ONE CAPSULE BY MOUTH AT BEDTIME AS NEEDED  30 capsule  0  . warfarin (COUMADIN) 5 MG tablet TAKE ONE TABLET BY MOUTH DAILY ON MONDAY AND WEDNESDAY AND TAKE 1/2 TABLET ON ALL OTHER DAYS AS DIRECTED  60 tablet  0   No current facility-administered medications for this visit.   Facility-Administered Medications Ordered in Other Visits  Medication Dose Route Frequency Provider Last Rate Last Dose  . Zoledronic Acid (ZOMETA) 4 mg IVPB  4 mg Intravenous Once Si Gaul, MD        SURGICAL HISTORY:  Past Surgical History  Procedure Laterality Date  . Abdominal hysterectomy    . Ankle fracture surgery Left     REVIEW OF SYSTEMS:  Constitutional: positive for fatigue Eyes: negative Ears, nose, mouth, throat, and face: negative Respiratory: negative Cardiovascular:  negative Gastrointestinal: negative Genitourinary:negative Integument/breast: negative Hematologic/lymphatic: negative Musculoskeletal:positive for back pain Neurological: positive for paresthesia Behavioral/Psych: negative Endocrine: negative Allergic/Immunologic: negative   PHYSICAL EXAMINATION: General appearance: alert, cooperative, fatigued and no distress Head: Normocephalic, without obvious abnormality, atraumatic Neck: no adenopathy, no JVD, supple, symmetrical, trachea midline and thyroid not enlarged, symmetric, no tenderness/mass/nodules Lymph nodes: Cervical, supraclavicular, and axillary nodes normal. Resp: clear to auscultation bilaterally Back: symmetric, no curvature. ROM normal. No CVA tenderness. Cardio: regular rate and rhythm, S1, S2 normal, no murmur, click, rub or gallop GI:  soft, non-tender; bowel sounds normal; no masses,  no organomegaly Extremities: extremities normal, atraumatic, no cyanosis or edema Neurologic: Alert and oriented X 3, normal strength and tone. Normal symmetric reflexes. Normal coordination and gait  ECOG PERFORMANCE STATUS: 2 - Symptomatic, <50% confined to bed  Blood pressure 146/56, pulse 82, temperature 97.9 F (36.6 C), temperature source Oral, resp. rate 17, height $RemoveBe'4\' 10"'fATgInacD$  (1.473 m), weight 127 lb 3.2 oz (57.698 kg), SpO2 98.00%.  LABORATORY DATA: Lab Results  Component Value Date   WBC 5.2 12/07/2013   HGB 10.8* 12/07/2013   HCT 33.7* 12/07/2013   MCV 97.8 12/07/2013   PLT 309 12/07/2013      Chemistry      Component Value Date/Time   NA 142 12/07/2013 1349   NA 142 09/09/2013 0608   K 3.5 12/07/2013 1349   K 4.1 09/09/2013 0608   CL 110 09/09/2013 0608   CL 107 02/14/2013 1311   CO2 25 12/07/2013 1349   CO2 20 09/09/2013 0608   BUN 14.6 12/07/2013 1349   BUN 15 09/09/2013 0608   CREATININE 0.8 12/07/2013 1349   CREATININE 0.97 09/09/2013 0608      Component Value Date/Time   CALCIUM 9.0 12/07/2013 1349   CALCIUM 8.3* 09/09/2013 2992   CALCIUM   Value: 5.7 CRITICAL RESULT CALLED TO, READ BACK BY AND VERIFIED WITH: [place] [id number] @ 1962 [place] (NOTE)  Amended report. Result repeated and verified. CORRECTED ON 10/14 AT 1429: PREVIOUSLY REPORTED AS Result repeated and verified.* 06/13/2008 1605   ALKPHOS 48 12/07/2013 1349   ALKPHOS 44 09/08/2013 0105   AST 14 12/07/2013 1349   AST 21 09/08/2013 0105   ALT 16 12/07/2013 1349   ALT 15 09/08/2013 0105   BILITOT 0.23 12/07/2013 1349   BILITOT 0.4 09/08/2013 0105     Lab results: Beta-2 microglobulin 2.43, free kappa light chain 0.76, free lambda light chain 14.50, kappa/lambda ratio 0.05. IgG 390, IgA 81 and IgM 111.  RADIOGRAPHIC STUDIES: No results found.  ASSESSMENT AND PLAN: This is a very pleasant 78 years old African American female with multiple myeloma currently treatment with Pomalyst 4 mg by mouth daily for 21 days every 4 weeks status post 10 cycles. The patient is tolerating her treatment fairly well.  Her myeloma panel performed recently showed no evidence for disease progression.  I discussed the lab result with the patient and her family. I recommended for her to continue her current treatment with Pomalyst. She will also continue on Zometa every 3 months as scheduled. She would come back for followup visit in one month for reevaluation with repeat CBC, comprehensive metabolic panel and LDH. The patient voices understanding of current disease status and treatment options and is in agreement with the current care plan.  All questions were answered. The patient knows to call the clinic with any problems, questions or concerns. We can certainly see the patient much sooner if necessary. I spent 15 minutes in face-to-face counseling with the patient and her family out of the total visit time 25 minutes.  Disclaimer: This note was dictated with voice recognition software. Similar sounding words can inadvertently be transcribed and may not be corrected upon review.

## 2013-12-15 ENCOUNTER — Other Ambulatory Visit: Payer: Self-pay | Admitting: Medical Oncology

## 2013-12-15 DIAGNOSIS — C9000 Multiple myeloma not having achieved remission: Secondary | ICD-10-CM

## 2013-12-15 MED ORDER — POMALIDOMIDE 4 MG PO CAPS: 4.0000 mg | ORAL_CAPSULE | Freq: Every day | ORAL | Status: DC

## 2013-12-15 NOTE — Telephone Encounter (Signed)
pomalyst requests sent to Dr Julien Nordmann.

## 2013-12-21 ENCOUNTER — Other Ambulatory Visit: Payer: Self-pay | Admitting: Family Medicine

## 2013-12-21 ENCOUNTER — Other Ambulatory Visit: Payer: Self-pay | Admitting: *Deleted

## 2013-12-21 DIAGNOSIS — C9000 Multiple myeloma not having achieved remission: Secondary | ICD-10-CM

## 2013-12-21 MED ORDER — OXYCODONE HCL 5 MG PO TABS: 5.0000 mg | ORAL_TABLET | Freq: Four times a day (QID) | ORAL | Status: DC | PRN

## 2013-12-21 NOTE — Telephone Encounter (Signed)
Received refill request electronically from pharmacy. Last office visit 09/27/13. Is it okay to refill medication?

## 2013-12-22 NOTE — Telephone Encounter (Signed)
Sent. Thanks. Would like to see her in another ~3 months if possible.

## 2013-12-23 NOTE — Telephone Encounter (Signed)
Left detailed message on voicemail of [name] (daughter).

## 2013-12-29 ENCOUNTER — Other Ambulatory Visit: Payer: Self-pay | Admitting: Internal Medicine

## 2013-12-31 ENCOUNTER — Other Ambulatory Visit: Payer: Self-pay | Admitting: Internal Medicine

## 2014-01-02 ENCOUNTER — Other Ambulatory Visit (HOSPITAL_BASED_OUTPATIENT_CLINIC_OR_DEPARTMENT_OTHER): Payer: Medicare Other

## 2014-01-02 ENCOUNTER — Other Ambulatory Visit: Payer: Self-pay | Admitting: *Deleted

## 2014-01-02 ENCOUNTER — Ambulatory Visit: Payer: Medicare Other

## 2014-01-02 ENCOUNTER — Other Ambulatory Visit: Payer: Self-pay | Admitting: Medical Oncology

## 2014-01-02 ENCOUNTER — Ambulatory Visit (HOSPITAL_BASED_OUTPATIENT_CLINIC_OR_DEPARTMENT_OTHER): Payer: Self-pay | Admitting: Pharmacist

## 2014-01-02 ENCOUNTER — Telehealth: Payer: Self-pay | Admitting: Medical Oncology

## 2014-01-02 DIAGNOSIS — I82401 Acute embolism and thrombosis of unspecified deep veins of right lower extremity: Secondary | ICD-10-CM

## 2014-01-02 DIAGNOSIS — C9000 Multiple myeloma not having achieved remission: Secondary | ICD-10-CM

## 2014-01-02 DIAGNOSIS — E876 Hypokalemia: Secondary | ICD-10-CM

## 2014-01-02 DIAGNOSIS — I82409 Acute embolism and thrombosis of unspecified deep veins of unspecified lower extremity: Secondary | ICD-10-CM

## 2014-01-02 DIAGNOSIS — Z7901 Long term (current) use of anticoagulants: Secondary | ICD-10-CM

## 2014-01-02 LAB — CBC WITH DIFFERENTIAL/PLATELET
BASO%: 3 % — AB (ref 0.0–2.0)
Basophils Absolute: 0.1 10*3/uL (ref 0.0–0.1)
EOS%: 0.7 % (ref 0.0–7.0)
Eosinophils Absolute: 0 10*3/uL (ref 0.0–0.5)
HCT: 37 % (ref 34.8–46.6)
HGB: 11.9 g/dL (ref 11.6–15.9)
LYMPH#: 0.9 10*3/uL (ref 0.9–3.3)
LYMPH%: 19.3 % (ref 14.0–49.7)
MCH: 31.2 pg (ref 25.1–34.0)
MCHC: 32.1 g/dL (ref 31.5–36.0)
MCV: 97.2 fL (ref 79.5–101.0)
MONO#: 1.3 10*3/uL — ABNORMAL HIGH (ref 0.1–0.9)
MONO%: 27.8 % — ABNORMAL HIGH (ref 0.0–14.0)
NEUT#: 2.3 10*3/uL (ref 1.5–6.5)
NEUT%: 49.2 % (ref 38.4–76.8)
Platelets: 355 10*3/uL (ref 145–400)
RBC: 3.81 10*6/uL (ref 3.70–5.45)
RDW: 16.4 % — AB (ref 11.2–14.5)
WBC: 4.7 10*3/uL (ref 3.9–10.3)

## 2014-01-02 LAB — COMPREHENSIVE METABOLIC PANEL (CC13)
ALT: 22 U/L (ref 0–55)
AST: 19 U/L (ref 5–34)
Albumin: 3.5 g/dL (ref 3.5–5.0)
Alkaline Phosphatase: 57 U/L (ref 40–150)
Anion Gap: 14 mEq/L — ABNORMAL HIGH (ref 3–11)
BUN: 21.3 mg/dL (ref 7.0–26.0)
CHLORIDE: 105 meq/L (ref 98–109)
CO2: 25 mEq/L (ref 22–29)
CREATININE: 1.1 mg/dL (ref 0.6–1.1)
Calcium: 10.1 mg/dL (ref 8.4–10.4)
Glucose: 166 mg/dl — ABNORMAL HIGH (ref 70–140)
POTASSIUM: 2.8 meq/L — AB (ref 3.5–5.1)
SODIUM: 145 meq/L (ref 136–145)
TOTAL PROTEIN: 6.5 g/dL (ref 6.4–8.3)
Total Bilirubin: 0.46 mg/dL (ref 0.20–1.20)

## 2014-01-02 LAB — POCT INR: INR: 2.8

## 2014-01-02 LAB — PROTIME-INR
INR: 2.8 (ref 2.00–3.50)
Protime: 33.6 Seconds — ABNORMAL HIGH (ref 10.6–13.4)

## 2014-01-02 MED ORDER — POTASSIUM CHLORIDE CRYS ER 20 MEQ PO TBCR: 40.0000 meq | EXTENDED_RELEASE_TABLET | Freq: Every day | ORAL | Status: DC

## 2014-01-02 MED ORDER — ALPRAZOLAM 0.5 MG PO TABS: 0.5000 mg | ORAL_TABLET | Freq: Three times a day (TID) | ORAL | Status: DC | PRN

## 2014-01-02 NOTE — Progress Notes (Signed)
Quick Note:  Call patient with the result and order K Dur 40 meq po qd X 10 days. ______

## 2014-01-02 NOTE — Progress Notes (Signed)
INR at goal Pt is doing well today Seen in infusion area for Zometa - but interval changed to every 3 months from every 2 months so patient being rescheduled to June No unusual bleeding or bruising No missed or extra doses No medication or diet changes Plan: No changes Continue coumadin 2.5mg  daily except 5mg  on Mon&Wed.   Recheck PT/INR in 4 weeks the first week of June with scheduled zometa infusion (not scheduled yet)

## 2014-01-02 NOTE — Telephone Encounter (Signed)
Per MD, patient's patient's potassium level @ 2.8 . LVMOM with Armando Reichert for patient to take 40 meq of potassium daily for 7 days d/t low potassium level. Prescription sent to [facility]. Ms. Yim to return call to office to confirm receipt of phone message.

## 2014-01-02 NOTE — Patient Instructions (Signed)
INR at goal No changes Continue coumadin 2.5mg  daily except 5mg  on Mon&Wed.   Recheck PT/INR in 4 weeks the first week of June with scheduled zometa infusion

## 2014-01-02 NOTE — Progress Notes (Signed)
Pt arrived for Zometa today; however per Dr. Julien Nordmann, patient to receive infusion every 3 months. Last dose 10/31/13. Patient to reschedule for June. She and daughter verbalize understanding. Patient discharged to home in stable condition.

## 2014-01-04 ENCOUNTER — Other Ambulatory Visit: Payer: Self-pay | Admitting: *Deleted

## 2014-01-04 DIAGNOSIS — C9000 Multiple myeloma not having achieved remission: Secondary | ICD-10-CM

## 2014-01-04 MED ORDER — OXYCODONE HCL 5 MG PO TABS: 5.0000 mg | ORAL_TABLET | Freq: Four times a day (QID) | ORAL | Status: DC | PRN

## 2014-01-05 ENCOUNTER — Telehealth: Payer: Self-pay | Admitting: Internal Medicine

## 2014-01-05 NOTE — Telephone Encounter (Signed)
Sent [name] a staff message to add the zometa appt in June.

## 2014-01-06 ENCOUNTER — Telehealth: Payer: Self-pay | Admitting: Internal Medicine

## 2014-01-06 ENCOUNTER — Telehealth: Payer: Self-pay | Admitting: *Deleted

## 2014-01-06 NOTE — Telephone Encounter (Signed)
lmonvm of the pt's dtr andrea regarding the June appts for lab and zometa

## 2014-01-06 NOTE — Telephone Encounter (Signed)
Per staff message and POF I have scheduled appts.  JMW  

## 2014-01-09 ENCOUNTER — Ambulatory Visit
Admission: RE | Admit: 2014-01-09 | Discharge: 2014-01-09 | Disposition: A | Discharge status: Home / Self Care | Payer: Medicare Other | Source: Ambulatory Visit

## 2014-01-09 ENCOUNTER — Other Ambulatory Visit: Payer: Self-pay | Admitting: Physician Assistant

## 2014-01-09 ENCOUNTER — Encounter (INDEPENDENT_AMBULATORY_CARE_PROVIDER_SITE_OTHER): Payer: Self-pay

## 2014-01-09 DIAGNOSIS — C9000 Multiple myeloma not having achieved remission: Secondary | ICD-10-CM

## 2014-01-09 DIAGNOSIS — Z1231 Encounter for screening mammogram for malignant neoplasm of breast: Secondary | ICD-10-CM

## 2014-01-11 ENCOUNTER — Telehealth: Payer: Self-pay | Admitting: Internal Medicine

## 2014-01-11 ENCOUNTER — Encounter: Payer: Self-pay | Admitting: Internal Medicine

## 2014-01-11 ENCOUNTER — Ambulatory Visit (HOSPITAL_BASED_OUTPATIENT_CLINIC_OR_DEPARTMENT_OTHER): Payer: Medicare Other | Admitting: Internal Medicine

## 2014-01-11 ENCOUNTER — Other Ambulatory Visit (HOSPITAL_BASED_OUTPATIENT_CLINIC_OR_DEPARTMENT_OTHER): Payer: Medicare Other

## 2014-01-11 VITALS — BP 122/68 | HR 80 | Temp 97.6°F | Resp 19 | Ht <= 58 in | Wt 126.4 lb

## 2014-01-11 DIAGNOSIS — C9000 Multiple myeloma not having achieved remission: Secondary | ICD-10-CM

## 2014-01-11 DIAGNOSIS — I82401 Acute embolism and thrombosis of unspecified deep veins of right lower extremity: Secondary | ICD-10-CM

## 2014-01-11 DIAGNOSIS — M545 Low back pain, unspecified: Secondary | ICD-10-CM

## 2014-01-11 DIAGNOSIS — I82409 Acute embolism and thrombosis of unspecified deep veins of unspecified lower extremity: Secondary | ICD-10-CM

## 2014-01-11 LAB — COMPREHENSIVE METABOLIC PANEL (CC13)
ALT: 19 U/L (ref 0–55)
ANION GAP: 13 meq/L — AB (ref 3–11)
AST: 11 U/L (ref 5–34)
Albumin: 2.8 g/dL — ABNORMAL LOW (ref 3.5–5.0)
Alkaline Phosphatase: 52 U/L (ref 40–150)
BUN: 19.6 mg/dL (ref 7.0–26.0)
CO2: 26 meq/L (ref 22–29)
CREATININE: 1.1 mg/dL (ref 0.6–1.1)
Calcium: 10.7 mg/dL — ABNORMAL HIGH (ref 8.4–10.4)
Chloride: 103 mEq/L (ref 98–109)
GLUCOSE: 140 mg/dL (ref 70–140)
Potassium: 3.1 mEq/L — ABNORMAL LOW (ref 3.5–5.1)
SODIUM: 141 meq/L (ref 136–145)
TOTAL PROTEIN: 5.9 g/dL — AB (ref 6.4–8.3)
Total Bilirubin: 0.25 mg/dL (ref 0.20–1.20)

## 2014-01-11 LAB — LACTATE DEHYDROGENASE (CC13): LDH: 221 U/L (ref 125–245)

## 2014-01-11 LAB — CBC WITH DIFFERENTIAL/PLATELET
BASO%: 0.4 % (ref 0.0–2.0)
Basophils Absolute: 0 10*3/uL (ref 0.0–0.1)
EOS%: 1.4 % (ref 0.0–7.0)
Eosinophils Absolute: 0.1 10*3/uL (ref 0.0–0.5)
HEMATOCRIT: 34.3 % — AB (ref 34.8–46.6)
HGB: 10.9 g/dL — ABNORMAL LOW (ref 11.6–15.9)
LYMPH%: 6.4 % — AB (ref 14.0–49.7)
MCH: 30.9 pg (ref 25.1–34.0)
MCHC: 31.8 g/dL (ref 31.5–36.0)
MCV: 97.1 fL (ref 79.5–101.0)
MONO#: 0.6 10*3/uL (ref 0.1–0.9)
MONO%: 6.9 % (ref 0.0–14.0)
NEUT#: 6.7 10*3/uL — ABNORMAL HIGH (ref 1.5–6.5)
NEUT%: 84.9 % — AB (ref 38.4–76.8)
Platelets: 244 10*3/uL (ref 145–400)
RBC: 3.53 10*6/uL — AB (ref 3.70–5.45)
RDW: 16.4 % — ABNORMAL HIGH (ref 11.2–14.5)
WBC: 7.9 10*3/uL (ref 3.9–10.3)
lymph#: 0.5 10*3/uL — ABNORMAL LOW (ref 0.9–3.3)

## 2014-01-11 LAB — PROTIME-INR
INR: 2.3 (ref 2.00–3.50)
PROTIME: 27.6 s — AB (ref 10.6–13.4)

## 2014-01-11 NOTE — Progress Notes (Signed)
[facility] [phone]   [phone]  OFFICE PROGRESS NOTE  [name], MD [place] [address]  DIAGNOSIS:  1. multiple myeloma diagnosed in September 2009,  2. history of bilateral lower extremity deep vein thrombosis diagnosed in November 2009  3. acute on chronic deep vein thrombosis in the left lower extremity diagnosed in October 2010.   PRIOR THERAPY:  1. status post palliative radiotherapy to the right femur and is she'll tuberosity, first was done in December 2011 and the second treatment was completed Jan 08 2011, and the third treatment to the right distal femur completed on 04/30/2011  2. status post palliative radiotherapy to the left proximal femur completed 02/28/2011.  3. Revlimid 25 mg by mouth daily for 21 days every 4 weeks in addition to oral Decadron at 40 mg by mouth on a weekly basis status post 40 cycles.  4. weekly subcutaneous Velcade 1.3 mg/M2 status post 60 cycles, discontinued today secondary to mild disease progression.   CURRENT THERAPY:  1. Pomalyst 4 mg by mouth daily for 21 days every 4 weeks, the patient started the first dose on 12/22/2012 , in addition to Decadron 40 mg weekly. Status post 11 cycles. She started cycle #12  on 05/05//2015. 2. Coumadin 5 mg on Mondays and Thursdays and 2.5 mg on all other days.  3. Zometa 4 mg IV given every 3 months.    INTERVAL HISTORY: [name] 78 y.o. female returns to the clinic today for followup visit accompanied by her daughter. She is tolerating her treatment with Pomalyst fairly well. She continues to have the low back pain with radiation to the lower extremity and she is currently on pain medication with OxyContin 10 mg by mouth at night time in addition to oxycodone 3 times a day. She denied having any significant nausea or vomiting. The patient denied having any significant weight loss or night sweats. She has no chest pain, shortness of  breath, cough or hemoptysis.     MEDICAL HISTORY: Past Medical History  Diagnosis Date  . Blood transfusion   . Depression     Mild  . History of chicken pox   . Hyperlipidemia   . Hypertension   . Degenerative joint disease   . Phlebitis   . Multiple myeloma     per Dr. Julien Nordmann, s/p palliative radiation for leg pain 2011 per Dr. Sondra Come  . Deep vein thrombosis of bilateral lower extremities   . Family history of anesthesia complication     DAUGHTER CPR AFTER  . Cardiomyopathy   . Diabetes mellitus     Steroid related  . UTI (urinary tract infection) 09/08/2013  . History of radiation therapy 08/30/13-09/20/13    20 gray to lower lumbar/upper sacrum    ALLERGIES:  has No Known Allergies.  MEDICATIONS:  Current Outpatient Prescriptions  Medication Sig Dispense Refill  . ALPRAZolam (XANAX) 0.5 MG tablet Take 1 tablet (0.5 mg total) by mouth 3 (three) times daily as needed for anxiety.  60 tablet  0  . Calcium Carbonate-Vitamin D 600-400 MG-UNIT per tablet Take 1 tablet by mouth 3 (three) times daily with meals.        Marland Kitchen dexamethasone (DECADRON) 4 MG tablet TAKE 10 TABLETS BY MOUTH ONCE PER WEEK  40 tablet  0  . gabapentin (NEURONTIN) 100 MG capsule Take 100 mg by mouth daily.      Marland Kitchen glipiZIDE (GLUCOTROL XL) 2.5  MG 24 hr tablet Take 1 tablet (2.5 mg total) by mouth daily.  90 tablet  3  . glucose blood (ONE TOUCH ULTRA TEST) test strip Use as instructed  25 each  6  . guaiFENesin (MUCINEX) 600 MG 12 hr tablet Take 1 tablet (600 mg total) by mouth 2 (two) times daily.  30 tablet  0  . Lancets (ONETOUCH ULTRASOFT) lancets Test blood sugar once daily or as directed.  100 each  11  . lisinopril-hydrochlorothiazide (PRINZIDE,ZESTORETIC) 20-12.5 MG per tablet TAKE ONE-HALF TABLET BY MOUTH EVERY DAY  45 tablet  1  . loperamide (IMODIUM A-D) 2 MG tablet Take 1 tablet (2 mg total) by mouth 4 (four) times daily as needed for diarrhea or loose stools.      Marland Kitchen loratadine (CLARITIN) 10 MG  tablet Take 1 tablet (10 mg total) by mouth daily.  30 tablet  0  . oxyCODONE (OXY IR/ROXICODONE) 5 MG immediate release tablet Take 1 tablet (5 mg total) by mouth every 6 (six) hours as needed for severe pain or breakthrough pain. Take 1 1/2 tabs by mouth every 6 hours as needed for pain  90 tablet  0  . OxyCODONE (OXYCONTIN) 10 mg T12A 12 hr tablet Take 1 tablet (10 mg total) by mouth every 12 (twelve) hours.  60 tablet  0  . pomalidomide (POMALYST) 4 MG capsule Take 1 capsule (4 mg total) by mouth daily. Take with water on days 1-21. Repeat every 28 days  21 capsule  0  . potassium chloride SA (K-DUR,KLOR-CON) 20 MEQ tablet Take 2 tablets (40 mEq total) by mouth daily.  7 tablet  0  . predniSONE (DELTASONE) 20 MG tablet Take 20 mg by mouth once a week.      . predniSONE (DELTASONE) 20 MG tablet TAKE 1 TAB BY MOUTH ONCE A WEEK WITH FOOD ON MONDAY  12 tablet  0  . prochlorperazine (COMPAZINE) 10 MG tablet Take 1 tablet (10 mg total) by mouth every 6 (six) hours as needed.  30 tablet  1  . saccharomyces boulardii (FLORASTOR) 250 MG capsule Take 1 capsule (250 mg total) by mouth 2 (two) times daily.  30 capsule  0  . temazepam (RESTORIL) 30 MG capsule TAKE ONE CAPSULE BY MOUTH AT BEDTIME AS NEEDED  30 capsule  0  . warfarin (COUMADIN) 5 MG tablet TAKE ONE TABLET BY MOUTH DAILY ON MONDAY AND WEDNESDAY AND TAKE 1/2 TABLET ON ALL OTHER DAYS AS DIRECTED  60 tablet  0   No current facility-administered medications for this visit.   Facility-Administered Medications Ordered in Other Visits  Medication Dose Route Frequency Provider Last Rate Last Dose  . Zoledronic Acid (ZOMETA) 4 mg IVPB  4 mg Intravenous Once Curt Bears, MD        SURGICAL HISTORY:  Past Surgical History  Procedure Laterality Date  . Abdominal hysterectomy    . Ankle fracture surgery Left     REVIEW OF SYSTEMS:  Constitutional: positive for fatigue Eyes: negative Ears, nose, mouth, throat, and face: negative Respiratory:  negative Cardiovascular: negative Gastrointestinal: negative Genitourinary:negative Integument/breast: negative Hematologic/lymphatic: negative Musculoskeletal:positive for back pain Neurological: positive for paresthesia Behavioral/Psych: negative Endocrine: negative Allergic/Immunologic: negative   PHYSICAL EXAMINATION: General appearance: alert, cooperative, fatigued and no distress Head: Normocephalic, without obvious abnormality, atraumatic Neck: no adenopathy, no JVD, supple, symmetrical, trachea midline and thyroid not enlarged, symmetric, no tenderness/mass/nodules Lymph nodes: Cervical, supraclavicular, and axillary nodes normal. Resp: clear to auscultation bilaterally Back: symmetric, no curvature.  ROM normal. No CVA tenderness. Cardio: regular rate and rhythm, S1, S2 normal, no murmur, click, rub or gallop GI: soft, non-tender; bowel sounds normal; no masses,  no organomegaly Extremities: extremities normal, atraumatic, no cyanosis or edema Neurologic: Alert and oriented X 3, normal strength and tone. Normal symmetric reflexes. Normal coordination and gait  ECOG PERFORMANCE STATUS: 2 - Symptomatic, <50% confined to bed  Blood pressure 122/68, pulse 80, temperature 97.6 F (36.4 C), temperature source Oral, resp. rate 19, height $RemoveBe'4\' 10"'[id number]$  (1.473 m), weight 126 lb 6.4 oz (57.335 kg).  LABORATORY DATA: Lab Results  Component Value Date   WBC 7.9 01/11/2014   HGB 10.9* 01/11/2014   HCT 34.3* 01/11/2014   MCV 97.1 01/11/2014   PLT 244 01/11/2014      Chemistry      Component Value Date/Time   NA 145 01/02/2014 1418   NA 142 09/09/2013 0608   K 2.8* 01/02/2014 1418   K 4.1 09/09/2013 0608   CL 110 09/09/2013 0608   CL 107 02/14/2013 1311   CO2 25 01/02/2014 1418   CO2 20 09/09/2013 0608   BUN 21.3 01/02/2014 1418   BUN 15 09/09/2013 0608   CREATININE 1.1 01/02/2014 1418   CREATININE 0.97 09/09/2013 0608      Component Value Date/Time   CALCIUM 10.1 01/02/2014 1418   CALCIUM 8.3* 09/09/2013  0608   CALCIUM  Value: 5.7 CRITICAL RESULT CALLED TO, READ BACK BY AND VERIFIED WITH: [place] [id number] @ [address] (NOTE)  Amended report. Result repeated and verified. CORRECTED ON 10/14 AT 1429: PREVIOUSLY REPORTED AS Result repeated and verified.* 06/13/2008 1605   ALKPHOS 57 01/02/2014 1418   ALKPHOS 44 09/08/2013 0105   AST 19 01/02/2014 1418   AST 21 09/08/2013 0105   ALT 22 01/02/2014 1418   ALT 15 09/08/2013 0105   BILITOT 0.46 01/02/2014 1418   BILITOT 0.4 09/08/2013 0105     RADIOGRAPHIC STUDIES: No results found.  ASSESSMENT AND PLAN: This is a very pleasant 78 years old African American female with multiple myeloma currently treatment with Pomalyst 4 mg by mouth daily for 21 days every 4 weeks status post 11 cycles. The patient is tolerating her treatment fairly well.  I recommended for the patient to continue her current treatment with Pomalyst and she started cycle #12 on 01/03/2014. She will also continue on Zometa every 3 months as scheduled, last dose was given in March of 2015. She would come back for followup visit in one month for reevaluation with repeat CBC, comprehensive metabolic panel and LDH. The patient voices understanding of current disease status and treatment options and is in agreement with the current care plan.  All questions were answered. The patient knows to call the clinic with any problems, questions or concerns. We can certainly see the patient much sooner if necessary.  Disclaimer: This note was dictated with voice recognition software. Similar sounding words can inadvertently be transcribed and may not be corrected upon review.

## 2014-01-11 NOTE — Progress Notes (Signed)
Quick Note:  Call patient with the result and order K Dur 20 meq po qd X 7 ______

## 2014-01-11 NOTE — Telephone Encounter (Signed)
gv and printed appt sched avs for pt for June

## 2014-01-12 ENCOUNTER — Encounter: Payer: Self-pay | Admitting: *Deleted

## 2014-01-17 ENCOUNTER — Telehealth: Payer: Self-pay | Admitting: *Deleted

## 2014-01-17 ENCOUNTER — Other Ambulatory Visit: Payer: Self-pay | Admitting: *Deleted

## 2014-01-17 DIAGNOSIS — E876 Hypokalemia: Secondary | ICD-10-CM

## 2014-01-17 MED ORDER — POTASSIUM CHLORIDE CRYS ER 20 MEQ PO TBCR: 20.0000 meq | EXTENDED_RELEASE_TABLET | Freq: Every day | ORAL | Status: DC

## 2014-01-17 MED ORDER — POTASSIUM CHLORIDE CRYS ER 20 MEQ PO TBCR: 40.0000 meq | EXTENDED_RELEASE_TABLET | Freq: Every day | ORAL | Status: DC

## 2014-01-17 NOTE — Telephone Encounter (Signed)
Called and left msg with pt's daughter Jerilynn Mages regarding K dur 12meq x 7 days.  SLJ

## 2014-01-18 ENCOUNTER — Ambulatory Visit: Payer: Medicare Other | Admitting: Family Medicine

## 2014-01-18 DIAGNOSIS — Z0289 Encounter for other administrative examinations: Secondary | ICD-10-CM

## 2014-01-20 ENCOUNTER — Encounter: Payer: Self-pay | Admitting: Family Medicine

## 2014-01-20 ENCOUNTER — Ambulatory Visit (INDEPENDENT_AMBULATORY_CARE_PROVIDER_SITE_OTHER): Payer: Medicare Other | Admitting: Family Medicine

## 2014-01-20 ENCOUNTER — Other Ambulatory Visit: Payer: Self-pay | Admitting: *Deleted

## 2014-01-20 VITALS — BP 122/60 | HR 78 | Temp 98.2°F | Wt 127.5 lb

## 2014-01-20 DIAGNOSIS — R2681 Unsteadiness on feet: Secondary | ICD-10-CM

## 2014-01-20 DIAGNOSIS — C9000 Multiple myeloma not having achieved remission: Secondary | ICD-10-CM

## 2014-01-20 DIAGNOSIS — E119 Type 2 diabetes mellitus without complications: Secondary | ICD-10-CM

## 2014-01-20 DIAGNOSIS — R269 Unspecified abnormalities of gait and mobility: Secondary | ICD-10-CM

## 2014-01-20 MED ORDER — OXYCODONE HCL 5 MG PO TABS: 5.0000 mg | ORAL_TABLET | Freq: Four times a day (QID) | ORAL | Status: DC | PRN

## 2014-01-20 NOTE — Patient Instructions (Signed)
Don't change your meds for now.   Go to the lab on the way out.  We'll contact you with your lab report. Use the pain medicine in the meantime.  If the pain is not getting better, then let me know. Take care.

## 2014-01-20 NOTE — Progress Notes (Signed)
Pre visit review using our clinic review tool, if applicable. No additional management support is needed unless otherwise documented below in the visit note.  Fell on her bottom last night.  Still with some pain on her mid/lateral buttocks.  She has some radicular pain B, but that isn't atypical for her- it was ongoing. No fevers.  Not painful sitting down, worse standing.  She was able to weight bear right after the event and also today.  Oxycodone has helped some with the pain.  Moving her bowel well.  She usually does her laundry, usually can ambulate the length of the house a few times with a walker.  Offered PT for fall reduction, she'll consider.    Appetite is variable.  She eats more the day after the decadron/prednsione.    INR done through the cancer center.    DM2.  Due for A1c.  Not checking sugars. Had lost, then found, her meter.  Due for eye exam, discussed.  A1c pending.  Compliant with meds, no ADE.   PMH and SH reviewed  ROS: See HPI, otherwise noncontributory.  Meds, vitals, and allergies reviewed.   nad In wheelchair, appears at baseline Mmm rrr ctab abd soft Ext w/o edema No pain on ROM of hips while sitting Not ttp over bony prominences at the hips Midline back not ttp  Diabetic foot exam: Normal inspection No skin breakdown No calluses  Normal DP pulses Normal sensation to light touch and monofilament Nails normal

## 2014-01-21 LAB — HEMOGLOBIN A1C
Hgb A1c MFr Bld: 7.5 % — ABNORMAL HIGH (ref <5.7)
MEAN PLASMA GLUCOSE: 169 mg/dL — AB (ref <117)

## 2014-01-24 NOTE — Assessment & Plan Note (Signed)
See notes on labs. Reasonable control. Continue as is.  >25 minutes spent in face to face time with patient, >50% spent in counselling or coordination of care.

## 2014-01-24 NOTE — Assessment & Plan Note (Signed)
No indication that we should get imaging, d/w pt.  Offered PT for fall reduction, she'll consider.  D/w pt/family about fall risk reduction.

## 2014-01-25 ENCOUNTER — Telehealth: Payer: Self-pay | Admitting: *Deleted

## 2014-01-25 NOTE — Telephone Encounter (Signed)
[facility] faxed Pomalyst refill request.  Request to provider's desk/in-basket for review.

## 2014-01-26 ENCOUNTER — Ambulatory Visit: Payer: Medicare Other

## 2014-01-26 ENCOUNTER — Other Ambulatory Visit: Payer: Self-pay | Admitting: *Deleted

## 2014-01-26 DIAGNOSIS — C9000 Multiple myeloma not having achieved remission: Secondary | ICD-10-CM

## 2014-01-26 MED ORDER — POMALIDOMIDE 4 MG PO CAPS: 4.0000 mg | ORAL_CAPSULE | Freq: Every day | ORAL | Status: DC

## 2014-01-30 ENCOUNTER — Ambulatory Visit (HOSPITAL_BASED_OUTPATIENT_CLINIC_OR_DEPARTMENT_OTHER): Payer: Self-pay | Admitting: Pharmacist

## 2014-01-30 ENCOUNTER — Ambulatory Visit: Payer: Medicare Other

## 2014-01-30 ENCOUNTER — Telehealth: Payer: Self-pay | Admitting: Internal Medicine

## 2014-01-30 ENCOUNTER — Encounter: Payer: Self-pay | Admitting: Physician Assistant

## 2014-01-30 ENCOUNTER — Other Ambulatory Visit: Payer: Medicare Other

## 2014-01-30 ENCOUNTER — Ambulatory Visit (HOSPITAL_BASED_OUTPATIENT_CLINIC_OR_DEPARTMENT_OTHER): Payer: Medicare Other | Admitting: Physician Assistant

## 2014-01-30 ENCOUNTER — Other Ambulatory Visit (HOSPITAL_BASED_OUTPATIENT_CLINIC_OR_DEPARTMENT_OTHER): Payer: Medicare Other

## 2014-01-30 VITALS — BP 121/48 | HR 66 | Temp 98.1°F | Resp 18 | Ht <= 58 in | Wt 124.6 lb

## 2014-01-30 DIAGNOSIS — C9000 Multiple myeloma not having achieved remission: Secondary | ICD-10-CM

## 2014-01-30 DIAGNOSIS — I82409 Acute embolism and thrombosis of unspecified deep veins of unspecified lower extremity: Secondary | ICD-10-CM

## 2014-01-30 DIAGNOSIS — I82401 Acute embolism and thrombosis of unspecified deep veins of right lower extremity: Secondary | ICD-10-CM

## 2014-01-30 DIAGNOSIS — Z86718 Personal history of other venous thrombosis and embolism: Secondary | ICD-10-CM

## 2014-01-30 DIAGNOSIS — Z7901 Long term (current) use of anticoagulants: Secondary | ICD-10-CM

## 2014-01-30 LAB — COMPREHENSIVE METABOLIC PANEL (CC13)
ALT: 20 U/L (ref 0–55)
AST: 18 U/L (ref 5–34)
Albumin: 3.1 g/dL — ABNORMAL LOW (ref 3.5–5.0)
Alkaline Phosphatase: 47 U/L (ref 40–150)
Anion Gap: 16 mEq/L — ABNORMAL HIGH (ref 3–11)
BUN: 30.2 mg/dL — ABNORMAL HIGH (ref 7.0–26.0)
CO2: 23 mEq/L (ref 22–29)
CREATININE: 1.6 mg/dL — AB (ref 0.6–1.1)
Calcium: 9.7 mg/dL (ref 8.4–10.4)
Chloride: 104 mEq/L (ref 98–109)
GLUCOSE: 138 mg/dL (ref 70–140)
Potassium: 3.5 mEq/L (ref 3.5–5.1)
Sodium: 142 mEq/L (ref 136–145)
Total Bilirubin: 0.31 mg/dL (ref 0.20–1.20)
Total Protein: 5.9 g/dL — ABNORMAL LOW (ref 6.4–8.3)

## 2014-01-30 LAB — LACTATE DEHYDROGENASE (CC13): LDH: 244 U/L (ref 125–245)

## 2014-01-30 LAB — CBC WITH DIFFERENTIAL/PLATELET
BASO%: 3 % — ABNORMAL HIGH (ref 0.0–2.0)
Basophils Absolute: 0.1 10*3/uL (ref 0.0–0.1)
EOS ABS: 0.1 10*3/uL (ref 0.0–0.5)
EOS%: 2 % (ref 0.0–7.0)
HEMATOCRIT: 32.9 % — AB (ref 34.8–46.6)
HEMOGLOBIN: 10.5 g/dL — AB (ref 11.6–15.9)
LYMPH%: 16.9 % (ref 14.0–49.7)
MCH: 31.4 pg (ref 25.1–34.0)
MCHC: 31.9 g/dL (ref 31.5–36.0)
MCV: 98.5 fL (ref 79.5–101.0)
MONO#: 0.8 10*3/uL (ref 0.1–0.9)
MONO%: 19.7 % — AB (ref 0.0–14.0)
NEUT%: 58.4 % (ref 38.4–76.8)
NEUTROS ABS: 2.3 10*3/uL (ref 1.5–6.5)
Platelets: 315 10*3/uL (ref 145–400)
RBC: 3.34 10*6/uL — ABNORMAL LOW (ref 3.70–5.45)
RDW: 15.8 % — ABNORMAL HIGH (ref 11.2–14.5)
WBC: 4 10*3/uL (ref 3.9–10.3)
lymph#: 0.7 10*3/uL — ABNORMAL LOW (ref 0.9–3.3)

## 2014-01-30 LAB — PROTIME-INR
INR: 2.4 (ref 2.00–3.50)
Protime: 28.8 Seconds — ABNORMAL HIGH (ref 10.6–13.4)

## 2014-01-30 LAB — POCT INR: INR: 2.4

## 2014-01-30 NOTE — Telephone Encounter (Signed)
RECEIVED A FAX FROM [facility] CONCERNING A CONFIRMATION OF PRESCRIPTION SHIPMENT FOR POMALYST ON 01/27/14.

## 2014-01-30 NOTE — Progress Notes (Signed)
[facility] [phone]   Fax:(336) [phone]  OFFICE PROGRESS NOTE  [name], MD [place] [address]  DIAGNOSIS:  1. multiple myeloma diagnosed in September 2009,  2. history of bilateral lower extremity deep vein thrombosis diagnosed in November 2009  3. acute on chronic deep vein thrombosis in the left lower extremity diagnosed in October 2010.   PRIOR THERAPY:  1. status post palliative radiotherapy to the right femur and is she'll tuberosity, first was done in December 2011 and the second treatment was completed Jan 08 2011, and the third treatment to the right distal femur completed on 04/30/2011  2. status post palliative radiotherapy to the left proximal femur completed 02/28/2011.  3. Revlimid 25 mg by mouth daily for 21 days every 4 weeks in addition to oral Decadron at 40 mg by mouth on a weekly basis status post 40 cycles.  4. weekly subcutaneous Velcade 1.3 mg/M2 status post 60 cycles, discontinued today secondary to mild disease progression.   CURRENT THERAPY:  1. Pomalyst 4 mg by mouth daily for 21 days every 4 weeks, the patient started the first dose on 12/22/2012 , in addition to Decadron 40 mg weekly. Status post 12 cycles. She started cycle #12  on 05/05//2015. 2. Coumadin 5 mg on Mondays and Thursdays and 2.5 mg on all other days.  3. Zometa 4 mg IV given every 3 months.    INTERVAL HISTORY: [name] 78 y.o. female returns to the clinic today for followup visit accompanied by her daughter. She is tolerating her treatment with Pomalyst fairly well. She continues to have the low back pain with radiation to the lower extremity and she is currently on pain medication with OxyContin 10 mg by mouth at night time in addition to oxycodone 3 times a day. This is under good control right now. She voices no specific complaints today.She denied having any significant nausea or vomiting. The patient denied having  any significant weight loss or night sweats. She has no chest pain, shortness of breath, cough or hemoptysis.     MEDICAL HISTORY: Past Medical History  Diagnosis Date  . Blood transfusion   . Depression     Mild  . History of chicken pox   . Hyperlipidemia   . Hypertension   . Degenerative joint disease   . Phlebitis   . Multiple myeloma     per Dr. Julien Nordmann, s/p palliative radiation for leg pain 2011 per Dr. Sondra Come  . Deep vein thrombosis of bilateral lower extremities   . Family history of anesthesia complication     DAUGHTER CPR AFTER  . Cardiomyopathy   . Diabetes mellitus     Steroid related  . UTI (urinary tract infection) 09/08/2013  . History of radiation therapy 08/30/13-09/20/13    20 gray to lower lumbar/upper sacrum    ALLERGIES:  has No Known Allergies.  MEDICATIONS:  Current Outpatient Prescriptions  Medication Sig Dispense Refill  . acyclovir (ZOVIRAX) 400 MG tablet       . ALPRAZolam (XANAX) 0.5 MG tablet Take 1 tablet (0.5 mg total) by mouth 3 (three) times daily as needed for anxiety.  60 tablet  0  . Calcium Carbonate-Vitamin D 600-400 MG-UNIT per tablet Take 1 tablet by mouth 3 (three) times daily with meals.        Marland Kitchen dexamethasone (DECADRON) 4 MG tablet TAKE 10 TABLETS BY MOUTH ONCE PER WEEK  40 tablet  0  . gabapentin (NEURONTIN) 100 MG capsule Take 100 mg by mouth daily.      Marland Kitchen glipiZIDE (GLUCOTROL XL) 2.5 MG 24 hr tablet Take 1 tablet (2.5 mg total) by mouth daily.  90 tablet  3  . glucose blood (ONE TOUCH ULTRA TEST) test strip Use as instructed  25 each  6  . guaiFENesin (MUCINEX) 600 MG 12 hr tablet Take 1 tablet (600 mg total) by mouth 2 (two) times daily.  30 tablet  0  . Lancets (ONETOUCH ULTRASOFT) lancets Test blood sugar once daily or as directed.  100 each  11  . lisinopril-hydrochlorothiazide (PRINZIDE,ZESTORETIC) 20-12.5 MG per tablet TAKE ONE-HALF TABLET BY MOUTH EVERY DAY  45 tablet  1  . loperamide (IMODIUM A-D) 2 MG tablet Take 1 tablet  (2 mg total) by mouth 4 (four) times daily as needed for diarrhea or loose stools.      Marland Kitchen loratadine (CLARITIN) 10 MG tablet Take 1 tablet (10 mg total) by mouth daily.  30 tablet  0  . oxyCODONE (OXY IR/ROXICODONE) 5 MG immediate release tablet Take 1 tablet (5 mg total) by mouth every 6 (six) hours as needed for severe pain or breakthrough pain. Take 1 1/2 tabs by mouth every 6 hours as needed for pain  90 tablet  0  . OxyCODONE (OXYCONTIN) 10 mg T12A 12 hr tablet Take 1 tablet (10 mg total) by mouth every 12 (twelve) hours.  60 tablet  0  . pomalidomide (POMALYST) 4 MG capsule Take 1 capsule (4 mg total) by mouth daily. Take with water on days 1-21. Repeat every 28 days  21 capsule  0  . potassium chloride SA (K-DUR,KLOR-CON) 20 MEQ tablet Take 1 tablet (20 mEq total) by mouth daily. X 7 days  7 tablet  0  . predniSONE (DELTASONE) 20 MG tablet Take 20 mg by mouth once a week.      . prochlorperazine (COMPAZINE) 10 MG tablet Take 1 tablet (10 mg total) by mouth every 6 (six) hours as needed.  30 tablet  1  . temazepam (RESTORIL) 30 MG capsule TAKE ONE CAPSULE BY MOUTH AT BEDTIME AS NEEDED  30 capsule  0  . warfarin (COUMADIN) 5 MG tablet TAKE ONE TABLET BY MOUTH DAILY ON MONDAY AND WEDNESDAY AND TAKE 1/2 TABLET ON ALL OTHER DAYS AS DIRECTED  60 tablet  0   No current facility-administered medications for this visit.   Facility-Administered Medications Ordered in Other Visits  Medication Dose Route Frequency Provider Last Rate Last Dose  . Zoledronic Acid (ZOMETA) 4 mg IVPB  4 mg Intravenous Once Curt Bears, MD        SURGICAL HISTORY:  Past Surgical History  Procedure Laterality Date  . Abdominal hysterectomy    . Ankle fracture surgery Left     REVIEW OF SYSTEMS:  Constitutional: positive for fatigue Eyes: negative Ears, nose, mouth, throat, and face: negative Respiratory: negative Cardiovascular: negative Gastrointestinal: negative Genitourinary:negative Integument/breast:  negative Hematologic/lymphatic: negative Musculoskeletal:positive for back pain Neurological: positive for paresthesia Behavioral/Psych: negative Endocrine: negative Allergic/Immunologic: negative   PHYSICAL EXAMINATION: General appearance: alert, cooperative, fatigued and no distress Head: Normocephalic, without obvious abnormality, atraumatic Neck: no adenopathy, no JVD, supple, symmetrical, trachea midline and thyroid not enlarged, symmetric, no tenderness/mass/nodules Lymph nodes: Cervical, supraclavicular, and axillary nodes normal. Resp: clear to auscultation bilaterally Back: symmetric, no curvature. ROM normal. No CVA tenderness. Cardio: regular rate and rhythm, S1, S2 normal, no murmur, click, rub or gallop GI: soft, non-tender;  bowel sounds normal; no masses,  no organomegaly Extremities: extremities normal, atraumatic, no cyanosis or edema Neurologic: Alert and oriented X 3, normal strength and tone. Normal symmetric reflexes. Normal coordination and gait  ECOG PERFORMANCE STATUS: 2 - Symptomatic, <50% confined to bed  Blood pressure 121/48, pulse 66, temperature 98.1 F (36.7 C), temperature source Oral, resp. rate 18, height _0  (1.473 m), weight 124 lb 9.6 oz (56.518 kg).  LABORATORY DATA: Lab Results  Component Value Date   WBC 4.0 01/30/2014   HGB 10.5* 01/30/2014   HCT 32.9* 01/30/2014   MCV 98.5 01/30/2014   PLT 315 01/30/2014      Chemistry      Component Value Date/Time   NA 142 01/30/2014 1435   NA 142 09/09/2013 0608   K 3.5 01/30/2014 1435   K 4.1 09/09/2013 0608   CL 110 09/09/2013 0608   CL 107 02/14/2013 1311   CO2 23 01/30/2014 1435   CO2 20 09/09/2013 0608   BUN 30.2* 01/30/2014 1435   BUN 15 09/09/2013 0608   CREATININE 1.6* 01/30/2014 1435   CREATININE 0.97 09/09/2013 0608      Component Value Date/Time   CALCIUM 9.7 01/30/2014 1435   CALCIUM 8.3* 09/09/2013 8841   CALCIUM  Value: 5.7 CRITICAL RESULT CALLED TO, READ BACK BY AND VERIFIED WITH: [place] [id number] @  [address] (NOTE)  Amended report. Result repeated and verified. CORRECTED ON 10/14 AT 1429: PREVIOUSLY REPORTED AS Result repeated and verified.* 06/13/2008 1605   ALKPHOS 47 01/30/2014 1435   ALKPHOS 44 09/08/2013 0105   AST 18 01/30/2014 1435   AST 21 09/08/2013 0105   ALT 20 01/30/2014 1435   ALT 15 09/08/2013 0105   BILITOT 0.31 01/30/2014 1435   BILITOT 0.4 09/08/2013 0105     RADIOGRAPHIC STUDIES: No results found.  ASSESSMENT AND PLAN: This is a very pleasant 78 years old African American female with multiple myeloma currently treatment with Pomalyst 4 mg by mouth daily for 21 days every 4 weeks status post 12 cycles. The patient is tolerating her treatment fairly well.  I recommended for the patient to continue her current treatment with Pomalyst and she started cycle #12 on 01/03/2014. She will also continue on Zometa every 3 months as scheduled, last dose was given in March of 2015. Zometa is held today do to elevated serum creatinine. We will have her return in one week for repeat chemistries to reevaluate her creatinine level. We will reschedule the Zometa infusion in the next 2-4 weeks. She'll followup in one month for another symptom management visit with repeat CBC, comprehensive metabolic panel and LDH.  The patient voices understanding of current disease status and treatment options and is in agreement with the current care plan.  All questions were answered. The patient knows to call the clinic with any problems, questions or concerns. We can certainly see the patient much sooner if necessary.  Carlton Adam, PA-C   Disclaimer: This note was dictated with voice recognition software. Similar sounding words can inadvertently be transcribed and may not be corrected upon review.

## 2014-01-30 NOTE — Progress Notes (Signed)
INR = 2.4 on Coumadin 2.5 mg daily except 5 mg on Mon/Wed Saw pt in the waiting area as she was getting ready to leave the center.  She was not tx w/ Zometa today due to elevated SCr. No missed Coumadin doses. No sxs of VTE. INR at goal.  No change to Coumadin dose necessary. Repeat protime 02/28/14. Kennith Center, Pharm.D., CPP 01/30/2014@4 :23 PM

## 2014-01-30 NOTE — Telephone Encounter (Signed)
gv adn printed appts ched adn avs for pt for June and July.Marland KitchenMarland KitchenMarland Kitchen

## 2014-01-30 NOTE — Progress Notes (Signed)
Pt here for zometa post office visit with [place], [place].  Crea level 1.6  Today.  Burnetta Sabin, [place], pharmacist notified.  Order received from [place], [place] to [employer] today as per pharmacy protocol due to increased creatinine level.  Explanations given to pt and daughter in the lobby.  Both voiced understanding.  Daughter to get new schedule appt for pt for next month to receive Zometa.Marland Kitchen

## 2014-02-01 ENCOUNTER — Telehealth: Payer: Self-pay | Admitting: *Deleted

## 2014-02-01 DIAGNOSIS — C9000 Multiple myeloma not having achieved remission: Secondary | ICD-10-CM

## 2014-02-01 NOTE — Patient Instructions (Signed)
Return in one week for repeat labs rechecked your creatinine level. Your Zometa has been held today because your creatinine level was higher than it has been in the past Hold your pomalyst until further notice Followup in one month

## 2014-02-01 NOTE — Telephone Encounter (Signed)
Called and informed patient and patients daughter Jerilynn Mages) to hold pomalyst. Also, informed patient and daughter that we will repeat labs in a 1 week. Per Awilda Metro, PA.  Patient and patient's daughter verbalized understanding.

## 2014-02-02 ENCOUNTER — Other Ambulatory Visit: Payer: Self-pay | Admitting: Internal Medicine

## 2014-02-02 ENCOUNTER — Telehealth: Payer: Self-pay | Admitting: Internal Medicine

## 2014-02-02 DIAGNOSIS — C9000 Multiple myeloma not having achieved remission: Secondary | ICD-10-CM

## 2014-02-02 NOTE — Telephone Encounter (Signed)
s.w. pt daughter and advised on June appt .Marland Kitchen..ok and aware

## 2014-02-06 ENCOUNTER — Other Ambulatory Visit: Payer: Self-pay | Admitting: *Deleted

## 2014-02-06 DIAGNOSIS — C9000 Multiple myeloma not having achieved remission: Secondary | ICD-10-CM

## 2014-02-06 MED ORDER — OXYCODONE HCL ER 10 MG PO T12A: 10.0000 mg | EXTENDED_RELEASE_TABLET | Freq: Two times a day (BID) | ORAL | Status: DC

## 2014-02-06 MED ORDER — OXYCODONE HCL 5 MG PO TABS: 5.0000 mg | ORAL_TABLET | Freq: Four times a day (QID) | ORAL | Status: DC | PRN

## 2014-02-07 ENCOUNTER — Other Ambulatory Visit (HOSPITAL_BASED_OUTPATIENT_CLINIC_OR_DEPARTMENT_OTHER): Payer: Medicare Other

## 2014-02-07 DIAGNOSIS — C9000 Multiple myeloma not having achieved remission: Secondary | ICD-10-CM

## 2014-02-07 LAB — COMPREHENSIVE METABOLIC PANEL (CC13)
ALT: 20 U/L (ref 0–55)
ANION GAP: 8 meq/L (ref 3–11)
AST: 16 U/L (ref 5–34)
Albumin: 3.1 g/dL — ABNORMAL LOW (ref 3.5–5.0)
Alkaline Phosphatase: 49 U/L (ref 40–150)
BUN: 16.2 mg/dL (ref 7.0–26.0)
CO2: 29 meq/L (ref 22–29)
Calcium: 9.4 mg/dL (ref 8.4–10.4)
Chloride: 108 mEq/L (ref 98–109)
Creatinine: 1.2 mg/dL — ABNORMAL HIGH (ref 0.6–1.1)
Glucose: 89 mg/dl (ref 70–140)
Potassium: 3.3 mEq/L — ABNORMAL LOW (ref 3.5–5.1)
SODIUM: 145 meq/L (ref 136–145)
TOTAL PROTEIN: 5.8 g/dL — AB (ref 6.4–8.3)
Total Bilirubin: 0.24 mg/dL (ref 0.20–1.20)

## 2014-02-07 LAB — CBC WITH DIFFERENTIAL/PLATELET
BASO%: 2.3 % — ABNORMAL HIGH (ref 0.0–2.0)
Basophils Absolute: 0.1 10*3/uL (ref 0.0–0.1)
EOS ABS: 0.1 10*3/uL (ref 0.0–0.5)
EOS%: 1.9 % (ref 0.0–7.0)
HEMATOCRIT: 32.8 % — AB (ref 34.8–46.6)
HGB: 10.3 g/dL — ABNORMAL LOW (ref 11.6–15.9)
LYMPH#: 0.3 10*3/uL — AB (ref 0.9–3.3)
LYMPH%: 6.5 % — AB (ref 14.0–49.7)
MCH: 31.1 pg (ref 25.1–34.0)
MCHC: 31.4 g/dL — AB (ref 31.5–36.0)
MCV: 99.2 fL (ref 79.5–101.0)
MONO#: 0.5 10*3/uL (ref 0.1–0.9)
MONO%: 10.3 % (ref 0.0–14.0)
NEUT%: 79 % — ABNORMAL HIGH (ref 38.4–76.8)
NEUTROS ABS: 4.2 10*3/uL (ref 1.5–6.5)
PLATELETS: 264 10*3/uL (ref 145–400)
RBC: 3.3 10*6/uL — AB (ref 3.70–5.45)
RDW: 17.2 % — ABNORMAL HIGH (ref 11.2–14.5)
WBC: 5.3 10*3/uL (ref 3.9–10.3)

## 2014-02-07 LAB — LACTATE DEHYDROGENASE (CC13): LDH: 274 U/L — AB (ref 125–245)

## 2014-02-13 ENCOUNTER — Other Ambulatory Visit: Payer: Self-pay | Admitting: Family Medicine

## 2014-02-13 ENCOUNTER — Telehealth: Payer: Self-pay | Admitting: *Deleted

## 2014-02-13 DIAGNOSIS — E876 Hypokalemia: Secondary | ICD-10-CM

## 2014-02-13 MED ORDER — POTASSIUM CHLORIDE CRYS ER 20 MEQ PO TBCR: 20.0000 meq | EXTENDED_RELEASE_TABLET | Freq: Every day | ORAL | Status: DC

## 2014-02-13 NOTE — Telephone Encounter (Signed)
Message copied by Norma Fredrickson on Mon Feb 13, 2014  9:14 AM ------      Message from: Britt Bottom      Created: Mon Feb 13, 2014  8:45 AM                   ----- Message -----         From: Carlton Adam, PA-C         Sent: 02/12/2014   4:54 PM           To: Anders Grant, RN, Diane Lillia Abed, RN, #            Abnormal results, please call in following prescription and notify patient. KCl 20 meq by mouth daily for 7 days ------

## 2014-02-13 NOTE — Telephone Encounter (Signed)
Called and informed patient and patient's daughter Jerilynn Mages) of lab results and informed a prescription for potassium will be sent to her pharmacy. Per Awilda Metro, PA.  Patient and patient's daughter verbalized understanding.

## 2014-02-21 ENCOUNTER — Telehealth: Payer: Self-pay

## 2014-02-21 ENCOUNTER — Other Ambulatory Visit: Payer: Self-pay | Admitting: Internal Medicine

## 2014-02-21 ENCOUNTER — Other Ambulatory Visit: Payer: Self-pay | Admitting: *Deleted

## 2014-02-21 DIAGNOSIS — C9000 Multiple myeloma not having achieved remission: Secondary | ICD-10-CM

## 2014-02-21 MED ORDER — OXYCODONE HCL 5 MG PO TABS: 5.0000 mg | ORAL_TABLET | Freq: Four times a day (QID) | ORAL | Status: DC | PRN

## 2014-02-21 NOTE — Telephone Encounter (Signed)
lvm that Rx for [name] is ready for pickup tomorrow. To bring driver's license when picking it up

## 2014-02-21 NOTE — Telephone Encounter (Signed)
THIS REFILL REQUEST FOR POMALYST WAS GIVEN TO DR.MOHAMED'S NURSE, STEPHANIE JOHNSON,RN.

## 2014-02-22 MED ORDER — POMALIDOMIDE 4 MG PO CAPS: 4.0000 mg | ORAL_CAPSULE | Freq: Every day | ORAL | Status: DC

## 2014-02-22 NOTE — Addendum Note (Signed)
Addended by: Tania Ade on: 02/22/2014 09:39 AM   Modules accepted: Orders

## 2014-02-27 ENCOUNTER — Ambulatory Visit: Payer: Medicare Other

## 2014-02-27 ENCOUNTER — Other Ambulatory Visit: Payer: Medicare Other

## 2014-02-28 ENCOUNTER — Other Ambulatory Visit: Payer: Self-pay | Admitting: Physician Assistant

## 2014-02-28 ENCOUNTER — Ambulatory Visit (HOSPITAL_BASED_OUTPATIENT_CLINIC_OR_DEPARTMENT_OTHER): Payer: Medicare Other | Admitting: Internal Medicine

## 2014-02-28 ENCOUNTER — Other Ambulatory Visit: Payer: Self-pay | Admitting: Internal Medicine

## 2014-02-28 ENCOUNTER — Ambulatory Visit (HOSPITAL_BASED_OUTPATIENT_CLINIC_OR_DEPARTMENT_OTHER): Payer: Medicare Other | Admitting: Pharmacist

## 2014-02-28 ENCOUNTER — Telehealth: Payer: Self-pay | Admitting: Internal Medicine

## 2014-02-28 ENCOUNTER — Other Ambulatory Visit (HOSPITAL_BASED_OUTPATIENT_CLINIC_OR_DEPARTMENT_OTHER): Payer: Medicare Other

## 2014-02-28 ENCOUNTER — Other Ambulatory Visit: Payer: Self-pay | Admitting: Family Medicine

## 2014-02-28 VITALS — BP 114/70 | HR 79 | Temp 97.6°F | Resp 18 | Ht <= 58 in | Wt 121.6 lb

## 2014-02-28 DIAGNOSIS — Z86718 Personal history of other venous thrombosis and embolism: Secondary | ICD-10-CM

## 2014-02-28 DIAGNOSIS — I82401 Acute embolism and thrombosis of unspecified deep veins of right lower extremity: Secondary | ICD-10-CM

## 2014-02-28 DIAGNOSIS — N289 Disorder of kidney and ureter, unspecified: Secondary | ICD-10-CM

## 2014-02-28 DIAGNOSIS — R229 Localized swelling, mass and lump, unspecified: Secondary | ICD-10-CM

## 2014-02-28 DIAGNOSIS — C9000 Multiple myeloma not having achieved remission: Secondary | ICD-10-CM

## 2014-02-28 LAB — COMPREHENSIVE METABOLIC PANEL (CC13)
ALT: 20 U/L (ref 0–55)
ANION GAP: 13 meq/L — AB (ref 3–11)
AST: 16 U/L (ref 5–34)
Albumin: 3 g/dL — ABNORMAL LOW (ref 3.5–5.0)
Alkaline Phosphatase: 51 U/L (ref 40–150)
BUN: 18.6 mg/dL (ref 7.0–26.0)
CALCIUM: 9.6 mg/dL (ref 8.4–10.4)
CHLORIDE: 103 meq/L (ref 98–109)
CO2: 27 meq/L (ref 22–29)
CREATININE: 1.1 mg/dL (ref 0.6–1.1)
GLUCOSE: 198 mg/dL — AB (ref 70–140)
Potassium: 2.8 mEq/L — CL (ref 3.5–5.1)
Sodium: 142 mEq/L (ref 136–145)
Total Bilirubin: 0.24 mg/dL (ref 0.20–1.20)
Total Protein: 6.1 g/dL — ABNORMAL LOW (ref 6.4–8.3)

## 2014-02-28 LAB — CBC WITH DIFFERENTIAL/PLATELET
BASO%: 0.2 % (ref 0.0–2.0)
BASOS ABS: 0 10*3/uL (ref 0.0–0.1)
EOS%: 3.3 % (ref 0.0–7.0)
Eosinophils Absolute: 0.3 10*3/uL (ref 0.0–0.5)
HEMATOCRIT: 35.9 % (ref 34.8–46.6)
HGB: 11.5 g/dL — ABNORMAL LOW (ref 11.6–15.9)
LYMPH%: 5.3 % — AB (ref 14.0–49.7)
MCH: 31.1 pg (ref 25.1–34.0)
MCHC: 32 g/dL (ref 31.5–36.0)
MCV: 97.4 fL (ref 79.5–101.0)
MONO#: 0.7 10*3/uL (ref 0.1–0.9)
MONO%: 8.6 % (ref 0.0–14.0)
NEUT#: 7.1 10*3/uL — ABNORMAL HIGH (ref 1.5–6.5)
NEUT%: 82.6 % — AB (ref 38.4–76.8)
Platelets: 236 10*3/uL (ref 145–400)
RBC: 3.69 10*6/uL — ABNORMAL LOW (ref 3.70–5.45)
RDW: 16.1 % — ABNORMAL HIGH (ref 11.2–14.5)
WBC: 8.6 10*3/uL (ref 3.9–10.3)
lymph#: 0.5 10*3/uL — ABNORMAL LOW (ref 0.9–3.3)

## 2014-02-28 LAB — PROTIME-INR
INR: 2.1 (ref 2.00–3.50)
Protime: 25.2 Seconds — ABNORMAL HIGH (ref 10.6–13.4)

## 2014-02-28 LAB — LACTATE DEHYDROGENASE (CC13): LDH: 268 U/L — AB (ref 125–245)

## 2014-02-28 LAB — POCT INR: INR: 2.1

## 2014-02-28 NOTE — Telephone Encounter (Signed)
gv adn rpinted aptps ched and avs for pt for July Sept and Dec...sed added tx.

## 2014-02-28 NOTE — Progress Notes (Signed)
INR = 2.1 on Coumadin 2.5mg  daily except 5mg  on Mon&Wed.   No complaints re: anticoag. No missed doses & no recent med changes.  Her Pomalyst is currently on hold. She is seeing Dr. Julien Nordmann today. INR is at goal.  No change to Coumadin dose necessary. Return to [place] in 1 month. Kennith Center, Pharm.D., CPP 02/28/2014@3 :15 PM

## 2014-02-28 NOTE — Progress Notes (Signed)
[facility] [phone]   [phone]  OFFICE PROGRESS NOTE  [name], MD [place] [address]  DIAGNOSIS:  1. multiple myeloma diagnosed in September 2009,  2. history of bilateral lower extremity deep vein thrombosis diagnosed in November 2009  3. acute on chronic deep vein thrombosis in the left lower extremity diagnosed in October 2010.   PRIOR THERAPY:  1. status post palliative radiotherapy to the right femur and is she'll tuberosity, first was done in December 2011 and the second treatment was completed Jan 08 2011, and the third treatment to the right distal femur completed on 04/30/2011  2. status post palliative radiotherapy to the left proximal femur completed 02/28/2011.  3. Revlimid 25 mg by mouth daily for 21 days every 4 weeks in addition to oral Decadron at 40 mg by mouth on a weekly basis status post 40 cycles.  4. weekly subcutaneous Velcade 1.3 mg/M2 status post 60 cycles, discontinued today secondary to mild disease progression.   CURRENT THERAPY:  1. Pomalyst 4 mg by mouth daily for 21 days every 4 weeks, the patient started the first dose on 12/22/2012 , in addition to Decadron 40 mg weekly. Status post 11 cycles. She started cycle #12  on 05/05//2015. 2. Coumadin 5 mg on Mondays and Thursdays and 2.5 mg on all other days.  3. Zometa 4 mg IV given every 3 months.    INTERVAL HISTORY: [name] 78 y.o. female returns to the clinic today for followup visit accompanied by her 2 daughter. She has been off treatment of Pomalyst for the last 4 weeks secondary to renal insufficiency. She is feeling fine today but she had days of increasing fatigue and weakness. She noticed a lump on the left side of the face started 2 weeks. She continues to have the low back pain with radiation to the lower extremity and she is currently on pain medication with OxyContin 10 mg by mouth at night time in addition to  oxycodone 3 times a day. She denied having any significant nausea or vomiting. The patient denied having any significant weight loss or night sweats. She has no chest pain, shortness of breath, cough or hemoptysis.     MEDICAL HISTORY: Past Medical History  Diagnosis Date  . Blood transfusion   . Depression     Mild  . History of chicken pox   . Hyperlipidemia   . Hypertension   . Degenerative joint disease   . Phlebitis   . Multiple myeloma     per Dr. Julien Nordmann, s/p palliative radiation for leg pain 2011 per Dr. Sondra Come  . Deep vein thrombosis of bilateral lower extremities   . Family history of anesthesia complication     DAUGHTER CPR AFTER  . Cardiomyopathy   . Diabetes mellitus     Steroid related  . UTI (urinary tract infection) 09/08/2013  . History of radiation therapy 08/30/13-09/20/13    20 gray to lower lumbar/upper sacrum    ALLERGIES:  has No Known Allergies.  MEDICATIONS:  Current Outpatient Prescriptions  Medication Sig Dispense Refill  . acyclovir (ZOVIRAX) 400 MG tablet 400 mg 2 (two) times daily.       Marland Kitchen ALPRAZolam (XANAX) 0.5 MG tablet TAKE ONE TABLET BY MOUTH TWICE DAILY AS NEEDED FOR ANXIETY  60 tablet  0  . Calcium Carbonate-Vitamin D 600-400 MG-UNIT per tablet Take 1 tablet by mouth 3 (three) times daily with meals.        Marland Kitchen  dexamethasone (DECADRON) 4 MG tablet TAKE 10 TABLETS BY MOUTH ONCE PER WEEK  40 tablet  0  . gabapentin (NEURONTIN) 100 MG capsule Take 100 mg by mouth daily.      Marland Kitchen glipiZIDE (GLUCOTROL XL) 2.5 MG 24 hr tablet TAKE ONE TABLET BY MOUTH ONCE DAILY  90 tablet  3  . glucose blood (ONE TOUCH ULTRA TEST) test strip Use as instructed  25 each  6  . guaiFENesin (MUCINEX) 600 MG 12 hr tablet Take 1 tablet (600 mg total) by mouth 2 (two) times daily.  30 tablet  0  . Lancets (ONETOUCH ULTRASOFT) lancets Test blood sugar once daily or as directed.  100 each  11  . oxyCODONE (OXY IR/ROXICODONE) 5 MG immediate release tablet Take 1 tablet (5 mg  total) by mouth every 6 (six) hours as needed for severe pain or breakthrough pain. Take 1 1/2 tabs by mouth every 6 hours as needed for pain  90 tablet  0  . OxyCODONE (OXYCONTIN) 10 mg T12A 12 hr tablet Take 1 tablet (10 mg total) by mouth every 12 (twelve) hours.  60 tablet  0  . pomalidomide (POMALYST) 4 MG capsule Take 1 capsule (4 mg total) by mouth daily. Take with water on days 1-21. Repeat every 28 days  21 capsule  0  . potassium chloride SA (K-DUR,KLOR-CON) 20 MEQ tablet Take 1 tablet (20 mEq total) by mouth daily.  7 tablet  0  . predniSONE (DELTASONE) 20 MG tablet Take 20 mg by mouth once a week.      . temazepam (RESTORIL) 30 MG capsule TAKE ONE CAPSULE BY MOUTH AT BEDTIME AS NEEDED  30 capsule  0  . warfarin (COUMADIN) 5 MG tablet TAKE ONE TABLET BY MOUTH DAILY ON MONDAY AND WEDNESDAY AND TAKE 1/2 TABLET ON ALL OTHER DAYS AS DIRECTED  60 tablet  0  . lisinopril-hydrochlorothiazide (PRINZIDE,ZESTORETIC) 20-12.5 MG per tablet TAKE ONE-HALF TABLET BY MOUTH ONCE DAILY  45 tablet  0  . loperamide (IMODIUM A-D) 2 MG tablet Take 1 tablet (2 mg total) by mouth 4 (four) times daily as needed for diarrhea or loose stools.      . prochlorperazine (COMPAZINE) 10 MG tablet Take 1 tablet (10 mg total) by mouth every 6 (six) hours as needed.  30 tablet  1   No current facility-administered medications for this visit.   Facility-Administered Medications Ordered in Other Visits  Medication Dose Route Frequency Provider Last Rate Last Dose  . Zoledronic Acid (ZOMETA) 4 mg IVPB  4 mg Intravenous Once Curt Bears, MD        SURGICAL HISTORY:  Past Surgical History  Procedure Laterality Date  . Abdominal hysterectomy    . Ankle fracture surgery Left     REVIEW OF SYSTEMS:  Constitutional: positive for fatigue Eyes: negative Ears, nose, mouth, throat, and face: negative Respiratory: negative Cardiovascular: negative Gastrointestinal: negative Genitourinary:negative Integument/breast:  negative Hematologic/lymphatic: negative Musculoskeletal:positive for back pain Neurological: positive for paresthesia Behavioral/Psych: negative Endocrine: negative Allergic/Immunologic: negative   PHYSICAL EXAMINATION: General appearance: alert, cooperative, fatigued and no distress Head: Normocephalic, without obvious abnormality, atraumatic, Subcutaneous nodule on the left side of the face. Neck: no adenopathy, no JVD, supple, symmetrical, trachea midline and thyroid not enlarged, symmetric, no tenderness/mass/nodules Lymph nodes: Cervical, supraclavicular, and axillary nodes normal. Resp: clear to auscultation bilaterally Back: symmetric, no curvature. ROM normal. No CVA tenderness. Cardio: regular rate and rhythm, S1, S2 normal, no murmur, click, rub or gallop GI: soft, non-tender;  bowel sounds normal; no masses,  no organomegaly Extremities: extremities normal, atraumatic, no cyanosis or edema Neurologic: Alert and oriented X 3, normal strength and tone. Normal symmetric reflexes. Normal coordination and gait  ECOG PERFORMANCE STATUS: 2 - Symptomatic, <50% confined to bed  Blood pressure 114/70, pulse 79, temperature 97.6 F (36.4 C), temperature source Oral, resp. rate 18, height $RemoveBe'4\' 10"'kESpTyTyK$  (1.473 m), weight 121 lb 9.6 oz (55.157 kg).  LABORATORY DATA: Lab Results  Component Value Date   WBC 8.6 02/28/2014   HGB 11.5* 02/28/2014   HCT 35.9 02/28/2014   MCV 97.4 02/28/2014   PLT 236 02/28/2014      Chemistry      Component Value Date/Time   NA 142 02/28/2014 1446   NA 142 09/09/2013 0608   K 2.8* 02/28/2014 1446   K 4.1 09/09/2013 0608   CL 110 09/09/2013 0608   CL 107 02/14/2013 1311   CO2 27 02/28/2014 1446   CO2 20 09/09/2013 0608   BUN 18.6 02/28/2014 1446   BUN 15 09/09/2013 0608   CREATININE 1.1 02/28/2014 1446   CREATININE 0.97 09/09/2013 0608      Component Value Date/Time   CALCIUM 9.6 02/28/2014 1446   CALCIUM 8.3* 09/09/2013 0608   CALCIUM  Value: 5.7 CRITICAL RESULT CALLED  TO, READ BACK BY AND VERIFIED WITH: [place] [id number] @ [address] [place] (NOTE)  Amended report. Result repeated and verified. CORRECTED ON 10/14 AT 1429: PREVIOUSLY REPORTED AS Result repeated and verified.* 06/13/2008 1605   ALKPHOS 51 02/28/2014 1446   ALKPHOS 44 09/08/2013 0105   AST 16 02/28/2014 1446   AST 21 09/08/2013 0105   ALT 20 02/28/2014 1446   ALT 15 09/08/2013 0105   BILITOT 0.24 02/28/2014 1446   BILITOT 0.4 09/08/2013 0105     RADIOGRAPHIC STUDIES: No results found.  ASSESSMENT AND PLAN: This is a very pleasant 78 years old African American female with multiple myeloma currently treatment with Pomalyst 4 mg by mouth daily for 21 days every 4 weeks status post 12 cycles. The patient is tolerating her treatment fairly well.  She was supposed to start cycle #13 in early June of 2015 but this is currently on hold because of renal insufficiency. Renal function has improved and I recommended for the patient to resume her treatment with Pomalyst and Decadron starting tomorrow. She will also continue on Zometa every 3 months as scheduled, last dose was given in March of 2015. For the left facial nodule, I order CT scan of the neck for evaluation of this lesion. She would come back for followup visit in one month for reevaluation with repeat myeloma panel. The patient voices understanding of current disease status and treatment options and is in agreement with the current care plan.  All questions were answered. The patient knows to call the clinic with any problems, questions or concerns. We can certainly see the patient much sooner if necessary.  Disclaimer: This note was dictated with voice recognition software. Similar sounding words can inadvertently be transcribed and may not be corrected upon review.

## 2014-03-01 ENCOUNTER — Ambulatory Visit (HOSPITAL_BASED_OUTPATIENT_CLINIC_OR_DEPARTMENT_OTHER): Payer: Medicare Other

## 2014-03-01 ENCOUNTER — Encounter (HOSPITAL_COMMUNITY): Payer: Self-pay

## 2014-03-01 ENCOUNTER — Other Ambulatory Visit: Payer: Self-pay | Admitting: *Deleted

## 2014-03-01 ENCOUNTER — Ambulatory Visit (HOSPITAL_COMMUNITY)
Admission: RE | Admit: 2014-03-01 | Discharge: 2014-03-01 | Disposition: A | Discharge status: Home / Self Care | Payer: Medicare Other | Source: Ambulatory Visit | Attending: Internal Medicine | Admitting: Internal Medicine

## 2014-03-01 VITALS — BP 104/64 | HR 82 | Temp 97.4°F | Resp 20

## 2014-03-01 DIAGNOSIS — I63239 Cerebral infarction due to unspecified occlusion or stenosis of unspecified carotid arteries: Secondary | ICD-10-CM | POA: Insufficient documentation

## 2014-03-01 DIAGNOSIS — C9000 Multiple myeloma not having achieved remission: Secondary | ICD-10-CM

## 2014-03-01 DIAGNOSIS — I6320 Cerebral infarction due to unspecified occlusion or stenosis of unspecified precerebral arteries: Secondary | ICD-10-CM | POA: Insufficient documentation

## 2014-03-01 DIAGNOSIS — R229 Localized swelling, mass and lump, unspecified: Secondary | ICD-10-CM

## 2014-03-01 MED ORDER — IOHEXOL 300 MG/ML  SOLN
80.0000 mL | Freq: Once | INTRAMUSCULAR | Status: AC | PRN
  Administered 2014-03-01: 80 mL via INTRAVENOUS

## 2014-03-01 MED ORDER — ZOLEDRONIC ACID 4 MG/100ML IV SOLN
4.0000 mg | Freq: Once | INTRAVENOUS | Status: AC
  Administered 2014-03-01: 4 mg via INTRAVENOUS
  Filled 2014-03-01: qty 100

## 2014-03-01 MED ORDER — ALPRAZOLAM 0.5 MG PO TABS: 0.5000 mg | ORAL_TABLET | Freq: Two times a day (BID) | ORAL | Status: DC | PRN

## 2014-03-01 NOTE — Patient Instructions (Signed)

## 2014-03-06 ENCOUNTER — Other Ambulatory Visit: Payer: Self-pay | Admitting: Medical Oncology

## 2014-03-06 DIAGNOSIS — I82409 Acute embolism and thrombosis of unspecified deep veins of unspecified lower extremity: Secondary | ICD-10-CM

## 2014-03-06 DIAGNOSIS — C9000 Multiple myeloma not having achieved remission: Secondary | ICD-10-CM

## 2014-03-06 MED ORDER — WARFARIN SODIUM 5 MG PO TABS: 5.0000 mg | ORAL_TABLET | ORAL | Status: DC

## 2014-03-06 MED ORDER — OXYCODONE HCL 5 MG PO TABS: 5.0000 mg | ORAL_TABLET | Freq: Four times a day (QID) | ORAL | Status: DC | PRN

## 2014-03-06 NOTE — Telephone Encounter (Signed)
Pain rx locked in injection room.

## 2014-03-09 ENCOUNTER — Emergency Department (HOSPITAL_COMMUNITY): Payer: Medicare Other

## 2014-03-09 ENCOUNTER — Encounter (HOSPITAL_COMMUNITY): Payer: Self-pay | Admitting: Emergency Medicine

## 2014-03-09 ENCOUNTER — Inpatient Hospital Stay (HOSPITAL_COMMUNITY)
Admission: EM | Admit: 2014-03-09 | Discharge: 2014-03-12 | DRG: 193 | Disposition: A | Discharge status: Home / Self Care | Payer: Medicare Other | Attending: Internal Medicine | Admitting: Internal Medicine

## 2014-03-09 DIAGNOSIS — E119 Type 2 diabetes mellitus without complications: Secondary | ICD-10-CM | POA: Diagnosis present

## 2014-03-09 DIAGNOSIS — M1990 Unspecified osteoarthritis, unspecified site: Secondary | ICD-10-CM | POA: Diagnosis present

## 2014-03-09 DIAGNOSIS — I10 Essential (primary) hypertension: Secondary | ICD-10-CM | POA: Diagnosis present

## 2014-03-09 DIAGNOSIS — Z823 Family history of stroke: Secondary | ICD-10-CM

## 2014-03-09 DIAGNOSIS — Z923 Personal history of irradiation: Secondary | ICD-10-CM

## 2014-03-09 DIAGNOSIS — N390 Urinary tract infection, site not specified: Secondary | ICD-10-CM

## 2014-03-09 DIAGNOSIS — C9002 Multiple myeloma in relapse: Secondary | ICD-10-CM | POA: Diagnosis present

## 2014-03-09 DIAGNOSIS — IMO0002 Reserved for concepts with insufficient information to code with codable children: Secondary | ICD-10-CM

## 2014-03-09 DIAGNOSIS — Z87891 Personal history of nicotine dependence: Secondary | ICD-10-CM

## 2014-03-09 DIAGNOSIS — E785 Hyperlipidemia, unspecified: Secondary | ICD-10-CM | POA: Diagnosis present

## 2014-03-09 DIAGNOSIS — Z86718 Personal history of other venous thrombosis and embolism: Secondary | ICD-10-CM

## 2014-03-09 DIAGNOSIS — Z9221 Personal history of antineoplastic chemotherapy: Secondary | ICD-10-CM

## 2014-03-09 DIAGNOSIS — I82402 Acute embolism and thrombosis of unspecified deep veins of left lower extremity: Secondary | ICD-10-CM

## 2014-03-09 DIAGNOSIS — Z8672 Personal history of thrombophlebitis: Secondary | ICD-10-CM

## 2014-03-09 DIAGNOSIS — Z7901 Long term (current) use of anticoagulants: Secondary | ICD-10-CM

## 2014-03-09 DIAGNOSIS — J189 Pneumonia, unspecified organism: Principal | ICD-10-CM | POA: Diagnosis present

## 2014-03-09 DIAGNOSIS — Z8249 Family history of ischemic heart disease and other diseases of the circulatory system: Secondary | ICD-10-CM

## 2014-03-09 DIAGNOSIS — I428 Other cardiomyopathies: Secondary | ICD-10-CM | POA: Diagnosis present

## 2014-03-09 DIAGNOSIS — G9340 Encephalopathy, unspecified: Secondary | ICD-10-CM | POA: Diagnosis present

## 2014-03-09 DIAGNOSIS — C9000 Multiple myeloma not having achieved remission: Secondary | ICD-10-CM

## 2014-03-09 DIAGNOSIS — Z66 Do not resuscitate: Secondary | ICD-10-CM | POA: Diagnosis present

## 2014-03-09 DIAGNOSIS — R410 Disorientation, unspecified: Secondary | ICD-10-CM

## 2014-03-09 DIAGNOSIS — Z833 Family history of diabetes mellitus: Secondary | ICD-10-CM

## 2014-03-09 DIAGNOSIS — R0902 Hypoxemia: Secondary | ICD-10-CM | POA: Diagnosis present

## 2014-03-09 NOTE — ED Notes (Signed)
Bed: WA08 Expected date: 03/09/14 Expected time: 10:25 PM Means of arrival: Ambulance Comments: Altered mental status

## 2014-03-09 NOTE — ED Notes (Signed)
Placed on 2 liters O2 via [place]

## 2014-03-09 NOTE — ED Notes (Signed)
Pt presented by EMS from home, report of altered mental status, although family reports ingestion of "white unknown substance" pt denies such, AOx4, in NAD.

## 2014-03-10 ENCOUNTER — Encounter (HOSPITAL_COMMUNITY): Payer: Self-pay | Admitting: *Deleted

## 2014-03-10 ENCOUNTER — Emergency Department (HOSPITAL_COMMUNITY): Payer: Medicare Other

## 2014-03-10 ENCOUNTER — Other Ambulatory Visit: Payer: Self-pay | Admitting: Internal Medicine

## 2014-03-10 DIAGNOSIS — C9000 Multiple myeloma not having achieved remission: Secondary | ICD-10-CM

## 2014-03-10 DIAGNOSIS — R410 Disorientation, unspecified: Secondary | ICD-10-CM | POA: Diagnosis present

## 2014-03-10 DIAGNOSIS — Z66 Do not resuscitate: Secondary | ICD-10-CM

## 2014-03-10 DIAGNOSIS — E119 Type 2 diabetes mellitus without complications: Secondary | ICD-10-CM

## 2014-03-10 DIAGNOSIS — R404 Transient alteration of awareness: Secondary | ICD-10-CM

## 2014-03-10 DIAGNOSIS — I82409 Acute embolism and thrombosis of unspecified deep veins of unspecified lower extremity: Secondary | ICD-10-CM

## 2014-03-10 DIAGNOSIS — F29 Unspecified psychosis not due to a substance or known physiological condition: Secondary | ICD-10-CM

## 2014-03-10 DIAGNOSIS — IMO0002 Reserved for concepts with insufficient information to code with codable children: Secondary | ICD-10-CM

## 2014-03-10 DIAGNOSIS — J189 Pneumonia, unspecified organism: Principal | ICD-10-CM

## 2014-03-10 LAB — RAPID URINE DRUG SCREEN, HOSP PERFORMED
Amphetamines: NOT DETECTED
BENZODIAZEPINES: POSITIVE — AB
Barbiturates: NOT DETECTED
Cocaine: NOT DETECTED
Opiates: NOT DETECTED
Tetrahydrocannabinol: NOT DETECTED

## 2014-03-10 LAB — GLUCOSE, CAPILLARY
GLUCOSE-CAPILLARY: 145 mg/dL — AB (ref 70–99)
Glucose-Capillary: 148 mg/dL — ABNORMAL HIGH (ref 70–99)
Glucose-Capillary: 132 mg/dL — ABNORMAL HIGH (ref 70–99)
Glucose-Capillary: 254 mg/dL — ABNORMAL HIGH (ref 70–99)
Glucose-Capillary: 109 mg/dL — ABNORMAL HIGH (ref 70–99)

## 2014-03-10 LAB — HIV ANTIBODY (ROUTINE TESTING W REFLEX): HIV 1&2 Ab, 4th Generation: NONREACTIVE

## 2014-03-10 LAB — CBC WITH DIFFERENTIAL/PLATELET
Basophils Absolute: 0 10*3/uL (ref 0.0–0.1)
Basophils Relative: 0 % (ref 0–1)
EOS ABS: 0.3 10*3/uL (ref 0.0–0.7)
Eosinophils Relative: 3 % (ref 0–5)
HCT: 33.2 % — ABNORMAL LOW (ref 36.0–46.0)
Hemoglobin: 10.5 g/dL — ABNORMAL LOW (ref 12.0–15.0)
LYMPHS PCT: 9 % — AB (ref 12–46)
Lymphs Abs: 1 10*3/uL (ref 0.7–4.0)
MCH: 30.8 pg (ref 26.0–34.0)
MCHC: 31.6 g/dL (ref 30.0–36.0)
MCV: 97.4 fL (ref 78.0–100.0)
Monocytes Absolute: 0.7 10*3/uL (ref 0.1–1.0)
Monocytes Relative: 6 % (ref 3–12)
NEUTROS PCT: 82 % — AB (ref 43–77)
Neutro Abs: 8.9 10*3/uL — ABNORMAL HIGH (ref 1.7–7.7)
Platelets: 223 10*3/uL (ref 150–400)
RBC: 3.41 MIL/uL — AB (ref 3.87–5.11)
RDW: 15.5 % (ref 11.5–15.5)
WBC: 10.9 10*3/uL — ABNORMAL HIGH (ref 4.0–10.5)

## 2014-03-10 LAB — COMPREHENSIVE METABOLIC PANEL
ALBUMIN: 2.8 g/dL — AB (ref 3.5–5.2)
ALT: 13 U/L (ref 0–35)
AST: 14 U/L (ref 0–37)
Alkaline Phosphatase: 51 U/L (ref 39–117)
Anion gap: 13 (ref 5–15)
BUN: 21 mg/dL (ref 6–23)
CALCIUM: 10.3 mg/dL (ref 8.4–10.5)
CO2: 27 meq/L (ref 19–32)
Chloride: 98 mEq/L (ref 96–112)
Creatinine, Ser: 1.53 mg/dL — ABNORMAL HIGH (ref 0.50–1.10)
GFR calc Af Amer: 37 mL/min — ABNORMAL LOW (ref >90)
GFR calc non Af Amer: 32 mL/min — ABNORMAL LOW (ref >90)
Glucose, Bld: 110 mg/dL — ABNORMAL HIGH (ref 70–99)
Potassium: 4.1 mEq/L (ref 3.7–5.3)
SODIUM: 138 meq/L (ref 137–147)
Total Bilirubin: 0.3 mg/dL (ref 0.3–1.2)
Total Protein: 6.4 g/dL (ref 6.0–8.3)

## 2014-03-10 LAB — LEGIONELLA ANTIGEN, URINE: LEGIONELLA ANTIGEN, URINE: NEGATIVE

## 2014-03-10 LAB — STREP PNEUMONIAE URINARY ANTIGEN: STREP PNEUMO URINARY ANTIGEN: NEGATIVE

## 2014-03-10 LAB — PROTIME-INR
INR: 2.62 — ABNORMAL HIGH (ref 0.00–1.49)
INR: 3.07 — AB (ref 0.00–1.49)
Prothrombin Time: 28 seconds — ABNORMAL HIGH (ref 11.6–15.2)
Prothrombin Time: 31.7 seconds — ABNORMAL HIGH (ref 11.6–15.2)

## 2014-03-10 LAB — SALICYLATE LEVEL

## 2014-03-10 LAB — URINALYSIS, ROUTINE W REFLEX MICROSCOPIC
Bilirubin Urine: NEGATIVE
Glucose, UA: NEGATIVE mg/dL
HGB URINE DIPSTICK: NEGATIVE
Ketones, ur: NEGATIVE mg/dL
Nitrite: NEGATIVE
PH: 5.5 (ref 5.0–8.0)
Protein, ur: 30 mg/dL — AB
Specific Gravity, Urine: 1.019 (ref 1.005–1.030)
Urobilinogen, UA: 0.2 mg/dL (ref 0.0–1.0)

## 2014-03-10 LAB — ETHANOL

## 2014-03-10 LAB — URINE MICROSCOPIC-ADD ON

## 2014-03-10 LAB — EXPECTORATED SPUTUM ASSESSMENT W GRAM STAIN, RFLX TO RESP C

## 2014-03-10 LAB — ACETAMINOPHEN LEVEL

## 2014-03-10 LAB — I-STAT CG4 LACTIC ACID, ED: Lactic Acid, Venous: 0.95 mmol/L (ref 0.5–2.2)

## 2014-03-10 MED ORDER — TEMAZEPAM 15 MG PO CAPS: 30.0000 mg | ORAL_CAPSULE | Freq: Every evening | ORAL | Status: DC | PRN

## 2014-03-10 MED ORDER — DM-GUAIFENESIN ER 30-600 MG PO TB12
1.0000 | ORAL_TABLET | Freq: Two times a day (BID) | ORAL | Status: DC
  Administered 2014-03-10 – 2014-03-12 (×5): 1 via ORAL
  Filled 2014-03-10 (×6): qty 1

## 2014-03-10 MED ORDER — WARFARIN - PHARMACIST DOSING INPATIENT: Freq: Every day | Status: DC

## 2014-03-10 MED ORDER — ALPRAZOLAM 0.5 MG PO TABS
0.5000 mg | ORAL_TABLET | Freq: Two times a day (BID) | ORAL | Status: DC | PRN
  Administered 2014-03-10: 0.5 mg via ORAL
  Filled 2014-03-10: qty 1

## 2014-03-10 MED ORDER — CEFTRIAXONE SODIUM 1 G IJ SOLR
1.0000 g | INTRAMUSCULAR | Status: DC
  Administered 2014-03-10 – 2014-03-11 (×2): 1 g via INTRAVENOUS
  Filled 2014-03-10 (×2): qty 10

## 2014-03-10 MED ORDER — PREDNISONE 20 MG PO TABS: 20.0000 mg | ORAL_TABLET | ORAL | Status: DC

## 2014-03-10 MED ORDER — GLIPIZIDE ER 2.5 MG PO TB24
2.5000 mg | ORAL_TABLET | Freq: Every day | ORAL | Status: DC
  Administered 2014-03-10 – 2014-03-12 (×3): 2.5 mg via ORAL
  Filled 2014-03-10 (×4): qty 1

## 2014-03-10 MED ORDER — ZOLPIDEM TARTRATE 5 MG PO TABS: 5.0000 mg | ORAL_TABLET | Freq: Every evening | ORAL | Status: DC | PRN

## 2014-03-10 MED ORDER — INSULIN ASPART 100 UNIT/ML ~~LOC~~ SOLN
0.0000 [IU] | Freq: Three times a day (TID) | SUBCUTANEOUS | Status: DC
  Administered 2014-03-10: 8 [IU] via SUBCUTANEOUS
  Administered 2014-03-10 – 2014-03-11 (×2): 2 [IU] via SUBCUTANEOUS
  Administered 2014-03-11: 5 [IU] via SUBCUTANEOUS

## 2014-03-10 MED ORDER — CALCIUM CARBONATE-VITAMIN D 500-200 MG-UNIT PO TABS
1.0000 | ORAL_TABLET | Freq: Three times a day (TID) | ORAL | Status: DC
  Administered 2014-03-10 – 2014-03-12 (×7): 1 via ORAL
  Filled 2014-03-10 (×10): qty 1

## 2014-03-10 MED ORDER — DEXTROSE 5 % IV SOLN
500.0000 mg | INTRAVENOUS | Status: DC
  Administered 2014-03-10 – 2014-03-12 (×3): 500 mg via INTRAVENOUS
  Filled 2014-03-10 (×3): qty 500

## 2014-03-10 MED ORDER — OXYCODONE HCL 5 MG PO TABS: 5.0000 mg | ORAL_TABLET | Freq: Four times a day (QID) | ORAL | Status: DC | PRN

## 2014-03-10 MED ORDER — SODIUM CHLORIDE 0.9 % IV SOLN
INTRAVENOUS | Status: AC
  Administered 2014-03-11: 04:00:00 via INTRAVENOUS

## 2014-03-10 MED ORDER — DEXTROSE 5 % IV SOLN
1.0000 g | Freq: Once | INTRAVENOUS | Status: AC
  Administered 2014-03-10: 1 g via INTRAVENOUS
  Filled 2014-03-10: qty 10

## 2014-03-10 MED ORDER — OXYCODONE HCL ER 10 MG PO T12A
10.0000 mg | EXTENDED_RELEASE_TABLET | Freq: Two times a day (BID) | ORAL | Status: DC
  Administered 2014-03-10 – 2014-03-12 (×5): 10 mg via ORAL
  Filled 2014-03-10 (×5): qty 1

## 2014-03-10 MED ORDER — SODIUM CHLORIDE 0.9 % IV BOLUS (SEPSIS)
1000.0000 mL | Freq: Once | INTRAVENOUS | Status: AC
  Administered 2014-03-10: 1000 mL via INTRAVENOUS

## 2014-03-10 MED ORDER — ACYCLOVIR 400 MG PO TABS
400.0000 mg | ORAL_TABLET | Freq: Two times a day (BID) | ORAL | Status: DC
  Administered 2014-03-10 – 2014-03-12 (×5): 400 mg via ORAL
  Filled 2014-03-10 (×6): qty 1

## 2014-03-10 MED ORDER — DEXAMETHASONE 6 MG PO TABS
40.0000 mg | ORAL_TABLET | ORAL | Status: DC
  Administered 2014-03-10: 40 mg via ORAL
  Filled 2014-03-10: qty 1

## 2014-03-10 MED ORDER — BIOTENE DRY MOUTH MT LIQD
15.0000 mL | Freq: Two times a day (BID) | OROMUCOSAL | Status: DC
  Administered 2014-03-10 – 2014-03-12 (×4): 15 mL via OROMUCOSAL

## 2014-03-10 MED ORDER — SODIUM CHLORIDE 0.9 % IV SOLN: INTRAVENOUS | Status: DC

## 2014-03-10 MED ORDER — ALPRAZOLAM 0.5 MG PO TABS
0.5000 mg | ORAL_TABLET | Freq: Two times a day (BID) | ORAL | Status: DC
  Administered 2014-03-10 – 2014-03-12 (×4): 0.5 mg via ORAL
  Filled 2014-03-10 (×4): qty 1

## 2014-03-10 NOTE — Plan of Care (Signed)
Problem: Phase I Progression Outcomes Goal: Initial discharge plan identified Outcome: Completed/Met Date Met:  03/10/14 Patient lives with children.

## 2014-03-10 NOTE — Progress Notes (Signed)
ANTICOAGULATION CONSULT NOTE - Initial Consult  Pharmacy Consult for warfarin Indication: DVT  No Known Allergies  Patient Measurements: Height: 4\' 10"  (147.3 cm) Weight: 119 lb 14.4 oz (54.386 kg) IBW/kg (Calculated) : 40.9 Heparin Dosing Weight:   Vital Signs: Temp: 99.1 F (37.3 C) (07/10 0451) Temp src: Oral (07/10 0451) BP: 124/46 mmHg (07/10 0451) Pulse Rate: 69 (07/10 0451)  Labs:  Recent Labs  03/09/14 2349 03/10/14 0500  HGB 10.5*  --   HCT 33.2*  --   PLT 223  --   LABPROT 28.0* 31.7*  INR 2.62* 3.07*  CREATININE 1.53*  --     Estimated Creatinine Clearance: 23.2 ml/min (by C-G formula based on Cr of 1.53).   Medical History: Past Medical History  Diagnosis Date  . Blood transfusion   . Depression     Mild  . History of chicken pox   . Hyperlipidemia   . Hypertension   . Degenerative joint disease   . Phlebitis   . Multiple myeloma     per Dr. Julien Nordmann, s/p palliative radiation for leg pain 2011 per Dr. Sondra Come  . Deep vein thrombosis of bilateral lower extremities   . Family history of anesthesia complication     DAUGHTER CPR AFTER  . Cardiomyopathy   . Diabetes mellitus     Steroid related  . UTI (urinary tract infection) 09/08/2013  . History of radiation therapy 08/30/13-09/20/13    20 gray to lower lumbar/upper sacrum    Medications:  Prescriptions prior to admission  Medication Sig Dispense Refill  . acyclovir (ZOVIRAX) 400 MG tablet Take 400 mg by mouth 2 (two) times daily.       Marland Kitchen ALPRAZolam (XANAX) 0.5 MG tablet Take 1 tablet (0.5 mg total) by mouth 2 (two) times daily as needed for anxiety.  60 tablet  0  . Calcium Carbonate-Vitamin D 600-400 MG-UNIT per tablet Take 1 tablet by mouth 3 (three) times daily with meals.        Marland Kitchen dexamethasone (DECADRON) 4 MG tablet Take 40 mg by mouth once a week.      Marland Kitchen dextromethorphan-guaiFENesin (MUCINEX DM) 30-600 MG per 12 hr tablet Take 1 tablet by mouth 2 (two) times daily.      Marland Kitchen glipiZIDE  (GLUCOTROL XL) 2.5 MG 24 hr tablet Take 2.5 mg by mouth daily with breakfast.      . lisinopril-hydrochlorothiazide (PRINZIDE,ZESTORETIC) 20-12.5 MG per tablet Take 0.5 tablets by mouth daily.      Marland Kitchen loperamide (IMODIUM A-D) 2 MG tablet Take 1 tablet (2 mg total) by mouth 4 (four) times daily as needed for diarrhea or loose stools.      Marland Kitchen oxyCODONE (OXY IR/ROXICODONE) 5 MG immediate release tablet Take 1 tablet (5 mg total) by mouth every 6 (six) hours as needed for severe pain or breakthrough pain. Take AS DIRECTED BY PHARMACIST IN COUMADIN CLINIC.  90 tablet  0  . OxyCODONE (OXYCONTIN) 10 mg T12A 12 hr tablet Take 1 tablet (10 mg total) by mouth every 12 (twelve) hours.  60 tablet  0  . pomalidomide (POMALYST) 4 MG capsule Take 1 capsule (4 mg total) by mouth daily. Take with water on days 1-21. Repeat every 28 days  21 capsule  0  . predniSONE (DELTASONE) 20 MG tablet Take 20 mg by mouth once a week.      . temazepam (RESTORIL) 30 MG capsule Take 30 mg by mouth at bedtime as needed for sleep.      Marland Kitchen  warfarin (COUMADIN) 5 MG tablet Take 2.5-5 mg by mouth daily. Takes 1(5mg ) tablet on mondays, Wednesday. Takes 1/2(2.5mg ) tablet on Tuesday,thursday, Friday, Saturday and sunday      . glucose blood (ONE TOUCH ULTRA TEST) test strip Use as instructed  25 each  6  . Lancets (ONETOUCH ULTRASOFT) lancets Test blood sugar once daily or as directed.  100 each  11    Assessment: Patient with chronic warfarin for hx of DVT.  INR >3 this am, was at goal on admit last night.  Only two INR, incr in short period, will evaluate next INR in AM to get better trend.  Goal of Therapy:  INR 2-3    Plan:  No warfarin at this time. INR > 3 and rising. Daily INR  Tyler Deis, She[facility] Stakes [place] 03/10/2014,6:20 AM

## 2014-03-10 NOTE — Progress Notes (Addendum)
Patient ID: [name], female   DOB: 1935/11/26, 78 y.o.   MRN: [id number] [facility] PROGRESS NOTE  [name] IZT:[id number] DOB: [birth year] DOA: 03/09/2014 PCP: [name], MD  Brief narrative: Addendum to admission note done 03/10/2014 78 y.o. female with  history of multiple myeloma, on chemotherapy, diabetes, DVT on Coumadin who presented to [facility] ED 03/09/2014 with altered mental status, difficulty speaking, unusual behavior. On admission, evaluation included CT head which was essentially unremarkable for acute changes. Chest x-ray showed possible infiltrates in left lung base. She was started on azithromycin and Rocephin for treatment of pneumonia.  Assessment/Plan:  Principal Problem: Acute encephalopathy, delirium  CT head did not show acute intracranial findings. Patient is being treated for possible pneumonia which could potentially cause mental status changes.   She feels much better this morning, she is oriented to place and person.  Needs physical therapy evaluation prior to discharge, order is in place    Active Problems: CAP (community acquired pneumonia)  Started on azithromycin and Rocephin  Followup sputum cultures, blood cultures   MULTIPLE  MYELOMA  Continue Decadron, prednisone per previous home regimen  She is on acyclovir for HSV prophylaxis   History of DVT  On Coumadin. Dosing per pharmacy.  DVT prophylaxis: On therapeutic anticoagulation with Coumadin  Code Status: DNR/DNI Family Communication: plan of care discussed with the patient Disposition Plan: home when stable   Leisa Lenz, MD  [facility] Pager [phone]  If 7PM-7AM, please contact night-coverage [email or web address] Password [email or web address] 03/10/2014, 10:24 AM   LOS: 1 day   Consultants:  None  Procedures:  None  Antibiotics:  Azithromycin 03/10/2014 -->  Rocephin 03/10/2014 -->  HPI/Subjective: No acute overnight events.  Objective: Filed Vitals:   03/09/14 2251  03/10/14 0420 03/10/14 0445 03/10/14 0451  BP:  120/50  124/46  Pulse:  74 76 69  Temp:    99.1 F (37.3 C)  TempSrc:    Oral  Resp:    16  Height:    '4\' 10"'$  (1.473 m)  Weight:    54.386 kg (119 lb 14.4 oz)  SpO2: 94% 96%  99%    Intake/Output Summary (Last 24 hours) at 03/10/14 1024 Last data filed at 03/10/14 9767  Gross per 24 hour  Intake 1373.75 ml  Output    300 ml  Net 1073.75 ml    Exam:   General:  Pt is alert, not in acute distress  Cardiovascular: Regular rate and rhythm, S1/S2, no murmurs  Respiratory: Clear to auscultation bilaterally, no wheezing, no crackles  Abdomen: Soft, non tender, non distended, bowel sounds present  Extremities: No edema, pulses DP and PT palpable bilaterally  Neuro: Grossly nonfocal  Data Reviewed: Basic Metabolic Panel:  Recent Labs Lab 03/09/14 2349  NA 138  K 4.1  CL 98  CO2 27  GLUCOSE 110*  BUN 21  CREATININE 1.53*  CALCIUM 10.3   Liver Function Tests:  Recent Labs Lab 03/09/14 2349  AST 14  ALT 13  ALKPHOS 51  BILITOT 0.3  PROT 6.4  ALBUMIN 2.8*   No results found for this basename: LIPASE, AMYLASE,  in the last 168 hours No results found for this basename: AMMONIA,  in the last 168 hours CBC:  Recent Labs Lab 03/09/14 2349  WBC 10.9*  NEUTROABS 8.9*  HGB 10.5*  HCT 33.2*  MCV 97.4  PLT 223   Cardiac Enzymes: No results found for this basename: CKTOTAL, CKMB, CKMBINDEX, TROPONINI,  in the last 168 hours BNP:  No components found with this basename: POCBNP,  CBG:  Recent Labs Lab 03/10/14 0724  GLUCAP 148*    CULTURE, EXPECTORATED SPUTUM-ASSESSMENT     Status: None   Collection Time    03/10/14  6:28 AM      Result Value Ref Range Status   Specimen Description SPUTUM   Final   Report Status 03/10/2014 FINAL   Final     Studies: Ct Head Wo Contrast 03/10/2014    IMPRESSION: No acute intracranial process.  Similar involutional changes, mild to moderate white matter changes suggest  chronic small vessel ischemic disease.  Acute paranasal sinusitis.     Dg Chest Port 1 View 03/10/2014    IMPRESSION: Chronic interstitial changes in the lungs. Infiltration or atelectasis in the left lung base      Scheduled Meds: . acyclovir  400 mg Oral BID  . azithromycin  500 mg Intravenous Q24H  . calcium-vitamin D  1 tablet Oral TID WC  . cefTRIAXone   1 g Intravenous Q24H  . dexamethasone  40 mg Oral Weekly  . dextromethorphan-guai  1 tablet Oral BID  . insulin aspart  0-15 Units Subcutaneous TID WC  . OxyCODONE  10 mg Oral Q12H  .  predniSONE  20 mg Oral Weekly  . Warfarin -   Does not apply q1800   Continuous Infusions: . sodium chloride 75 mL/hr at 03/10/14 0445

## 2014-03-10 NOTE — H&P (Addendum)
[facility] History and Physical  Lenyx Boody IRJ:[id number] DOB: 01-May-1936 DOA: 03/09/2014  Referring physician: EDP PCP: [name], MD   Chief Complaint: AMS   HPI: Rudi Knippenberg is a 78 y.o. female with MM currently on daily pill chemo therapy, presents to ED from home with 1 day history of AMS.  Daughter reports patient was at baseline around 7 PM, called by brother who lives with her around 8 pm, and he reported patient having difficulty speaking and acting strange.  Specifically patient was naked from waist up sitting in her bedroom.  Family also reports she was taking an unknown white liquid medication (EMS said this was milk).  Family reports similar presentation in the past when patient had taken too much pain medication or had an infection.  Family does report a 1 week history of new cough, no fevers nor chills, have been giving her mucinex for cough.  Patient noted to be hypoxic on arrival and found to have a PNA in her left lung base on work up in the ED.  Review of Systems: Systems reviewed.  As above, otherwise negative  Past Medical History  Diagnosis Date  . Blood transfusion   . Depression     Mild  . History of chicken pox   . Hyperlipidemia   . Hypertension   . Degenerative joint disease   . Phlebitis   . Multiple myeloma     per Dr. Julien Nordmann, s/p palliative radiation for leg pain 2011 per Dr. Sondra Come  . Deep vein thrombosis of bilateral lower extremities   . Family history of anesthesia complication     DAUGHTER CPR AFTER  . Cardiomyopathy   . Diabetes mellitus     Steroid related  . UTI (urinary tract infection) 09/08/2013  . History of radiation therapy 08/30/13-09/20/13    20 gray to lower lumbar/upper sacrum   Past Surgical History  Procedure Laterality Date  . Abdominal hysterectomy    . Ankle fracture surgery Left    Social History:  reports that she has quit smoking. She has never used smokeless tobacco. She reports that she does not drink  alcohol or use illicit drugs.  No Known Allergies  Family History  Problem Relation Age of Onset  . Diabetes Mother   . Hypertension Mother   . Arthritis Mother   . Stroke Mother   . Diabetes Father   . Hypertension Father   . Arthritis Father   . Stroke Father   . Cancer Sister     stomach  . Stroke Brother   . Cancer Brother     prostate <50  . Stroke Daughter   . Stroke Son   . Arthritis Other     Parents  . Cancer Other     Colon, 1st degree relative <60  . Diabetes Other     Parents  . Stroke Other     1st degree relative <60     Prior to Admission medications   Medication Sig Start Date End Date Taking? Authorizing Provider  acyclovir (ZOVIRAX) 400 MG tablet Take 400 mg by mouth 2 (two) times daily.  01/09/14  Yes Historical Provider, MD  ALPRAZolam Duanne Moron) 0.5 MG tablet Take 1 tablet (0.5 mg total) by mouth 2 (two) times daily as needed for anxiety. 03/01/14  Yes Curt Bears, MD  Calcium Carbonate-Vitamin D 600-400 MG-UNIT per tablet Take 1 tablet by mouth 3 (three) times daily with meals.     Yes Historical Provider, MD  dexamethasone (DECADRON)  4 MG tablet Take 40 mg by mouth once a week.   Yes Historical Provider, MD  dextromethorphan-guaiFENesin (MUCINEX DM) 30-600 MG per 12 hr tablet Take 1 tablet by mouth 2 (two) times daily.   Yes Historical Provider, MD  glipiZIDE (GLUCOTROL XL) 2.5 MG 24 hr tablet Take 2.5 mg by mouth daily with breakfast.   Yes Historical Provider, MD  lisinopril-hydrochlorothiazide (PRINZIDE,ZESTORETIC) 20-12.5 MG per tablet Take 0.5 tablets by mouth daily.   Yes Historical Provider, MD  loperamide (IMODIUM A-D) 2 MG tablet Take 1 tablet (2 mg total) by mouth 4 (four) times daily as needed for diarrhea or loose stools. 09/12/13  Yes Adeline Saralyn Pilar, MD  oxyCODONE (OXY IR/ROXICODONE) 5 MG immediate release tablet Take 1 tablet (5 mg total) by mouth every 6 (six) hours as needed for severe pain or breakthrough pain. Take AS DIRECTED BY  PHARMACIST IN COUMADIN CLINIC. 03/06/14  Yes Curt Bears, MD  OxyCODONE (OXYCONTIN) 10 mg T12A 12 hr tablet Take 1 tablet (10 mg total) by mouth every 12 (twelve) hours. 02/06/14  Yes Curt Bears, MD  pomalidomide (POMALYST) 4 MG capsule Take 1 capsule (4 mg total) by mouth daily. Take with water on days 1-21. Repeat every 28 days 02/22/14  Yes Curt Bears, MD  predniSONE (DELTASONE) 20 MG tablet Take 20 mg by mouth once a week.   Yes Historical Provider, MD  temazepam (RESTORIL) 30 MG capsule Take 30 mg by mouth at bedtime as needed for sleep.   Yes Historical Provider, MD  warfarin (COUMADIN) 5 MG tablet Take 2.5-5 mg by mouth daily. Takes 1(5mg ) tablet on mondays, Wednesday. Takes 1/2(2.5mg ) tablet on Tuesday,thursday, Friday, Saturday and sunday   Yes Historical Provider, MD  glucose blood (ONE TOUCH ULTRA TEST) test strip Use as instructed 12/23/11   Tonia Ghent, MD  Lancets [facility] ULTRASOFT) lancets Test blood sugar once daily or as directed. 06/30/11   Tonia Ghent, MD   Physical Exam: Filed Vitals:   03/09/14 2249  BP: 111/59  Pulse: 81  Temp: 98.4 F (36.9 C)  Resp: 15    BP 111/59  Pulse 81  Temp(Src) 98.4 F (36.9 C) (Oral)  Resp 15  SpO2 94%  General Appearance:    Alert, oriented, no distress, appears stated age  Head:    Normocephalic, atraumatic  Eyes:    PERRL, EOMI, sclera non-icteric        Nose:   Nares without drainage or epistaxis. Mucosa, turbinates normal  Throat:   Moist mucous membranes. Oropharynx without erythema or exudate.  Neck:   Supple. No carotid bruits.  No thyromegaly.  No lymphadenopathy.   Back:     No CVA tenderness, no spinal tenderness  Lungs:     Clear to auscultation bilaterally, without wheezes, rhonchi or rales  Chest wall:    No tenderness to palpitation  Heart:    Regular rate and rhythm without murmurs, gallops, rubs  Abdomen:     Soft, non-tender, nondistended, normal bowel sounds, no organomegaly  Genitalia:     deferred  Rectal:    deferred  Extremities:   No clubbing, cyanosis or edema.  Pulses:   2+ and symmetric all extremities  Skin:   Skin color, texture, turgor normal, no rashes or lesions  Lymph nodes:   Cervical, supraclavicular, and axillary nodes normal  Neurologic:   CNII-XII intact. Normal strength, sensation and reflexes      throughout    Labs on Admission:  Basic Metabolic Panel:  Recent Labs Lab 03/09/14 2349  NA 138  K 4.1  CL 98  CO2 27  GLUCOSE 110*  BUN 21  CREATININE 1.53*  CALCIUM 10.3   Liver Function Tests:  Recent Labs Lab 03/09/14 2349  AST 14  ALT 13  ALKPHOS 51  BILITOT 0.3  PROT 6.4  ALBUMIN 2.8*   No results found for this basename: LIPASE, AMYLASE,  in the last 168 hours No results found for this basename: AMMONIA,  in the last 168 hours CBC:  Recent Labs Lab 03/09/14 2349  WBC 10.9*  NEUTROABS 8.9*  HGB 10.5*  HCT 33.2*  MCV 97.4  PLT 223   Cardiac Enzymes: No results found for this basename: CKTOTAL, CKMB, CKMBINDEX, TROPONINI,  in the last 168 hours  BNP (last 3 results) No results found for this basename: PROBNP,  in the last 8760 hours CBG: No results found for this basename: GLUCAP,  in the last 168 hours  Radiological Exams on Admission: Ct Head Wo Contrast  03/10/2014   CLINICAL DATA:  Altered mental status.  EXAM: CT HEAD WITHOUT CONTRAST  TECHNIQUE: Contiguous axial images were obtained from the base of the skull through the vertex without intravenous contrast.  COMPARISON:  CT of the head September 08, 2013  FINDINGS: The ventricles and sulci are normal for age. No intraparenchymal hemorrhage, mass effect nor midline shift. Patchy supratentorial white matter hypodensities are within normal range for patient's age and though non-specific suggest sequelae of chronic small vessel ischemic disease. No acute large vascular territory infarcts.  No abnormal extra-axial fluid collections. Basal cisterns are patent. Moderate calcific  atherosclerosis of the carotid siphons.  No skull fracture. The included ocular globes and orbital contents are non-suspicious. Status post bilateral ocular lens implants. Bilateral maxillary sinus air-fluid levels, sphenoid sinus air-fluid levels.  IMPRESSION: No acute intracranial process.  Similar involutional changes, mild to moderate white matter changes suggest chronic small vessel ischemic disease.  Acute paranasal sinusitis.   Electronically Signed   By: Elon Alas   On: 03/10/2014 00:36   Dg Chest Port 1 View  03/10/2014   CLINICAL DATA:  Hypoxia.  Altered mental status.  EXAM: PORTABLE CHEST - 1 VIEW  COMPARISON:  09/08/2013  FINDINGS: Shallow inspiration. Heart size and pulmonary vascularity are normal. Central interstitial changes again demonstrated. Hazy opacity in the left lung base may represent atelectasis or infiltration. Old fracture deformity of the distal left clavicle.  IMPRESSION: Chronic interstitial changes in the lungs. Infiltration or atelectasis in the left lung base   Electronically Signed   By: Lucienne Capers M.D.   On: 03/10/2014 00:08    EKG: Independently reviewed.  Assessment/Plan Principal Problem:   CAP (community acquired pneumonia) Active Problems:   MULTIPLE  MYELOMA   Delirium   1. CAP - on PNA pathway, rocephin and azithromycin, O2 as needed, cultures pending. 2. Delirium - AMS believed to be secondary to #1 above, surprisingly patients UDS was completely negative for opiods despite being prescribed them (not the cause therefore of her delirium). 3. MM - holding chemotherapy in setting of acute infection, will continue the steroids. 4. DM2 - hold home glipizide and putting patient on med dose SSI AC/HS   Code Status: DNR - confirmed with family at bedside Family Communication: Family at bedside Disposition Plan: Admit to inpatient   Time spent: 70 min  Taytum Scheck M. [facility] Pager [phone]  If 7AM-7PM, please contact the  day team taking care of the patient [email or web address]  Password [email or web address] 03/10/2014, 4:14 AM

## 2014-03-10 NOTE — Progress Notes (Signed)
Patient admitted from home with AMS. +UTI & PNA & sinusitis. On IV antibiotics. Patient admitted to room 1331 from ED. No complaints. Alert & oriented to place/person/month but not to year nor situation. Temp 99.1. sats > 93% on 2l [place]. Patient & family  oriented to room and unit.

## 2014-03-10 NOTE — ED Provider Notes (Signed)
CSN: [id number]     Arrival date & time 03/09/14  2242 History   First MD Initiated Contact with Patient 03/09/14 2304     Chief Complaint  Patient presents with  . Altered Mental Status     (Consider location/radiation/quality/duration/timing/severity/associated sxs/prior Treatment) HPI 78 year old female presents to emergency department via EMS from home with complaint of altered metal status.  Daughter reports the patient was at her normal baseline at around 7 PM.  She was called by her brother who lives which and at around 8 reporting that patient was having difficulty speaking and was acting strange.  Patient daughter reports that patient was naked from the waist up sitting in her bedroom, she seemed disoriented and had difficulty speaking.  Patient has had similar presentation in the past with taking too much pain medication.  Family reports that she was taking an unknown white liquid medication.  Family cannot find bottle and goes with the medication and do not know what the medication might of been.  Patient has had a cough for the last week and a half.  They've been giving her Mucinex for cough.  No reported fevers or chills.  Patient noted to be hypoxic upon arrival, does not normally wear oxygen.  Patient has significant history of multiple myeloma, is on Decadron, OxyContin and oxycodone.  Patient also has history of DVT on Coumadin. Past Medical History  Diagnosis Date  . Blood transfusion   . Depression     Mild  . History of chicken pox   . Hyperlipidemia   . Hypertension   . Degenerative joint disease   . Phlebitis   . Multiple myeloma     per Dr. Julien Nordmann, s/p palliative radiation for leg pain 2011 per Dr. Sondra Come  . Deep vein thrombosis of bilateral lower extremities   . Family history of anesthesia complication     DAUGHTER CPR AFTER  . Cardiomyopathy   . Diabetes mellitus     Steroid related  . UTI (urinary tract infection) 09/08/2013  . History of radiation therapy  08/30/13-09/20/13    20 gray to lower lumbar/upper sacrum   Past Surgical History  Procedure Laterality Date  . Abdominal hysterectomy    . Ankle fracture surgery Left    Family History  Problem Relation Age of Onset  . Diabetes Mother   . Hypertension Mother   . Arthritis Mother   . Stroke Mother   . Diabetes Father   . Hypertension Father   . Arthritis Father   . Stroke Father   . Cancer Sister     stomach  . Stroke Brother   . Cancer Brother     prostate <50  . Stroke Daughter   . Stroke Son   . Arthritis Other     Parents  . Cancer Other     Colon, 1st degree relative <60  . Diabetes Other     Parents  . Stroke Other     1st degree relative <60   History  Substance Use Topics  . Smoking status: Former Research scientist (life sciences)  . Smokeless tobacco: Never Used     Comment: QIUT MANY YEARS AGO  . Alcohol Use: No   OB History   Grav Para Term Preterm Abortions TAB SAB Ect Mult Living                 Review of Systems  Unable to perform ROS: Mental status change      Allergies  Review of patient's allergies indicates  no known allergies.  Home Medications   Prior to Admission medications   Medication Sig Start Date End Date Taking? Authorizing Provider  acyclovir (ZOVIRAX) 400 MG tablet Take 400 mg by mouth 2 (two) times daily.  01/09/14  Yes Historical Provider, MD  ALPRAZolam Duanne Moron) 0.5 MG tablet Take 1 tablet (0.5 mg total) by mouth 2 (two) times daily as needed for anxiety. 03/01/14  Yes Curt Bears, MD  Calcium Carbonate-Vitamin D 600-400 MG-UNIT per tablet Take 1 tablet by mouth 3 (three) times daily with meals.     Yes Historical Provider, MD  dexamethasone (DECADRON) 4 MG tablet Take 40 mg by mouth once a week.   Yes Historical Provider, MD  dextromethorphan-guaiFENesin (MUCINEX DM) 30-600 MG per 12 hr tablet Take 1 tablet by mouth 2 (two) times daily.   Yes Historical Provider, MD  glipiZIDE (GLUCOTROL XL) 2.5 MG 24 hr tablet Take 2.5 mg by mouth daily with  breakfast.   Yes Historical Provider, MD  lisinopril-hydrochlorothiazide (PRINZIDE,ZESTORETIC) 20-12.5 MG per tablet Take 0.5 tablets by mouth daily.   Yes Historical Provider, MD  loperamide (IMODIUM A-D) 2 MG tablet Take 1 tablet (2 mg total) by mouth 4 (four) times daily as needed for diarrhea or loose stools. 09/12/13  Yes Adeline Saralyn Pilar, MD  oxyCODONE (OXY IR/ROXICODONE) 5 MG immediate release tablet Take 1 tablet (5 mg total) by mouth every 6 (six) hours as needed for severe pain or breakthrough pain. Take AS DIRECTED BY PHARMACIST IN COUMADIN CLINIC. 03/06/14  Yes Curt Bears, MD  OxyCODONE (OXYCONTIN) 10 mg T12A 12 hr tablet Take 1 tablet (10 mg total) by mouth every 12 (twelve) hours. 02/06/14  Yes Curt Bears, MD  pomalidomide (POMALYST) 4 MG capsule Take 1 capsule (4 mg total) by mouth daily. Take with water on days 1-21. Repeat every 28 days 02/22/14  Yes Curt Bears, MD  predniSONE (DELTASONE) 20 MG tablet Take 20 mg by mouth once a week.   Yes Historical Provider, MD  temazepam (RESTORIL) 30 MG capsule Take 30 mg by mouth at bedtime as needed for sleep.   Yes Historical Provider, MD  warfarin (COUMADIN) 5 MG tablet Take 2.5-5 mg by mouth daily. Takes 1(65m) tablet on mondays, Wednesday. Takes 1/2(2.5100m tablet on Tuesday,thursday, Friday, Saturday and sunday   Yes Historical Provider, MD  glucose blood (ONE TOUCH ULTRA TEST) test strip Use as instructed 12/23/11   GrTonia GhentMD  Lancets [facility]) lancets Test blood sugar once daily or as directed. 06/30/11   GrTonia GhentMD   BP 111/59  Pulse 81  Temp(Src) 98.4 F (36.9 C) (Oral)  Resp 15  SpO2 94% Physical Exam  Nursing note and vitals reviewed. Constitutional: She is oriented to person, place, and time. She appears well-developed and well-nourished. No distress.  HENT:  Head: Normocephalic and atraumatic.  Right Ear: External ear normal.  Left Ear: External ear normal.  Nose: Nose normal.   Mouth/Throat: Oropharynx is clear and moist.  Eyes: Conjunctivae and EOM are normal. Pupils are equal, round, and reactive to light.  Neck: Normal range of motion. Neck supple. No JVD present. No tracheal deviation present. No thyromegaly present.  Cardiovascular: Normal rate, regular rhythm, normal heart sounds and intact distal pulses.  Exam reveals no gallop and no friction rub.   No murmur heard. Pulmonary/Chest: Effort normal and breath sounds normal. No stridor. No respiratory distress. She has no wheezes. She has no rales. She exhibits no tenderness.  Cough present  Abdominal: Soft. Bowel sounds are normal. She exhibits no distension and no mass. There is no tenderness. There is no rebound and no guarding.  Musculoskeletal: Normal range of motion. She exhibits no edema and no tenderness.  Lymphadenopathy:    She has no cervical adenopathy.  Neurological: She is alert and oriented to person, place, and time. She has normal reflexes. A cranial nerve deficit is present. She exhibits normal muscle tone. Coordination abnormal.  Patient has dysarthria and word finding difficulty.  She has generalized weakness.  She has difficulty following commands.  She has some underlying restlessness  Skin: Skin is warm and dry. No rash noted. No erythema. No pallor.  Psychiatric: She has a normal mood and affect. Her behavior is normal. Judgment and thought content normal.    ED Course  Procedures (including critical care time) Labs Review Labs Reviewed  URINE RAPID DRUG SCREEN (HOSP PERFORMED) - Abnormal; Notable for the following:    Benzodiazepines POSITIVE (*)    All other components within normal limits  CBC WITH DIFFERENTIAL  COMPREHENSIVE METABOLIC PANEL  URINALYSIS, ROUTINE W REFLEX MICROSCOPIC  SALICYLATE LEVEL  ACETAMINOPHEN LEVEL  PROTIME-INR  ETHANOL    Imaging Review Ct Head Wo Contrast  03/10/2014   CLINICAL DATA:  Altered mental status.  EXAM: CT HEAD WITHOUT CONTRAST   TECHNIQUE: Contiguous axial images were obtained from the base of the skull through the vertex without intravenous contrast.  COMPARISON:  CT of the head September 08, 2013  FINDINGS: The ventricles and sulci are normal for age. No intraparenchymal hemorrhage, mass effect nor midline shift. Patchy supratentorial white matter hypodensities are within normal range for patient's age and though non-specific suggest sequelae of chronic small vessel ischemic disease. No acute large vascular territory infarcts.  No abnormal extra-axial fluid collections. Basal cisterns are patent. Moderate calcific atherosclerosis of the carotid siphons.  No skull fracture. The included ocular globes and orbital contents are non-suspicious. Status post bilateral ocular lens implants. Bilateral maxillary sinus air-fluid levels, sphenoid sinus air-fluid levels.  IMPRESSION: No acute intracranial process.  Similar involutional changes, mild to moderate white matter changes suggest chronic small vessel ischemic disease.  Acute paranasal sinusitis.   Electronically Signed   By: Elon Alas   On: 03/10/2014 00:36   Dg Chest Port 1 View  03/10/2014   CLINICAL DATA:  Hypoxia.  Altered mental status.  EXAM: PORTABLE CHEST - 1 VIEW  COMPARISON:  09/08/2013  FINDINGS: Shallow inspiration. Heart size and pulmonary vascularity are normal. Central interstitial changes again demonstrated. Hazy opacity in the left lung base may represent atelectasis or infiltration. Old fracture deformity of the distal left clavicle.  IMPRESSION: Chronic interstitial changes in the lungs. Infiltration or atelectasis in the left lung base   Electronically Signed   By: Lucienne Capers M.D.   On: 03/10/2014 00:08     EKG Interpretation None      MDM   Final diagnoses:  CAP (community acquired pneumonia)  UTI (lower urinary tract infection)  Confusion   78 year old female with acute onset of confusion.  Patient has some difficulty speaking, appears to  have some problems with comprehension.  Concern for toxidrome, overmedication.  Plan for chest x-ray, CT head, labs.  2:58 AM Patient's urines drug screen does not show opiates, although family insists that they give her her OxyContin and oxycodone as scheduled.  She did positive for benzos, is on Xanax.  She has had a increase in her creatinine.  White blood cells also slightly  elevated.  Chest x-ray concerning for possible pneumonia in the left lung base.  She has a mild urinary tract infection as well.  Will start Rocephin for treatment of CAP and UTI.  Will discuss with hospitalist for admission.    Kalman Drape, MD 03/10/14 0300

## 2014-03-10 NOTE — Evaluation (Addendum)
Physical Therapy Evaluation Patient Details Name: [name] MRN: [id number] DOB: 03-30-1936 Today's Date: 03/10/2014   History of Present Illness  Pt is a 78 year old female admitted 7/9 with confusion and diagnosed with UTI and CAP, hx of multiple myeloma.  Clinical Impression  Pt admitted with above. Pt currently with functional limitations due to the deficits listed below (see PT Problem List).  Pt will benefit from skilled PT to increase their independence and safety with mobility to allow discharge to the venue listed below.  Pt reports her children live with her and that her RW is in poor condition however she always uses.  Pt denies falls.  Pt ambulated in hallway and SpO2 decreased on room air requiring oxygen (see below).  Pt likely won't require f/u PT however recommend initial supervision due to cognition (alert and orientated however does not appear at baseline however no family present).     Follow Up Recommendations No PT follow up;Supervision for mobility/OOB (likely won't need f/u)    Equipment Recommendations  Rolling walker with 5" wheels    Recommendations for Other Services       Precautions / Restrictions Precautions Precautions: Fall Precaution Comments: monitor O2      Mobility  Bed Mobility Overal bed mobility: Needs Assistance Bed Mobility: Supine to Sit;Sit to Supine     Supine to sit: Supervision;HOB elevated Sit to supine: Mod assist;HOB elevated   General bed mobility comments: assist for LEs onto bed  Transfers Overall transfer level: Needs assistance Equipment used: Rolling walker (2 wheeled) Transfers: Sit to/from Stand Sit to Stand: Min guard         General transfer comment: verbal cues for safety  Ambulation/Gait Ambulation/Gait assistance: Min guard Ambulation Distance (Feet): 200 Feet Assistive device: Rolling walker (2 wheeled) Gait Pattern/deviations: Step-through pattern;Narrow base of support;Decreased stride length      General Gait Details: steady with RW, monitored SpO2 which decreased to 84% room air so applied oxygen  Stairs            Wheelchair Mobility    Modified Rankin (Stroke Patients Only)       Balance                                             Pertinent Vitals/Pain SATURATION QUALIFICATIONS: (This note is used to comply with regulatory documentation for home oxygen)  Patient Saturations on Room Air at Rest = 89%  Patient Saturations on Room Air while Ambulating = 84%  Patient Saturations on 2 Liters of oxygen while Ambulating = 97%  Please briefly explain why patient needs home oxygen: to maintain saturations above 88% during ambulation    Home Living Family/patient expects to be discharged to:: Private residence Living Arrangements: Children (states her children live with her)             Home Equipment: Environmental consultant - 2 wheels Additional Comments: pt did not describe home however previous admission notes house with ramped entrance and one level    Prior Function Level of Independence: Independent with assistive device(s)               Hand Dominance        Extremity/Trunk Assessment               Lower Extremity Assessment: Generalized weakness      Cervical / Trunk Assessment: Normal  Communication   Communication: No difficulties  Cognition Arousal/Alertness: Awake/alert Behavior During Therapy: Flat affect Overall Cognitive Status: No family/caregiver present to determine baseline cognitive functioning                      General Comments      Exercises        Assessment/Plan    PT Assessment Patient needs continued PT services  PT Diagnosis Difficulty walking   PT Problem List Decreased strength;Decreased activity tolerance;Decreased mobility;Cardiopulmonary status limiting activity  PT Treatment Interventions DME instruction;Gait training;Functional mobility training;Therapeutic  activities;Therapeutic exercise;Patient/family education   PT Goals (Current goals can be found in the Care Plan section) Acute Rehab PT Goals PT Goal Formulation: With patient Time For Goal Achievement: 03/17/14 Potential to Achieve Goals: Good    Frequency Min 3X/week   Barriers to discharge        Co-evaluation               End of Session Equipment Utilized During Treatment: Oxygen Activity Tolerance: Patient tolerated treatment well Patient left: in bed;with call bell/phone within reach;with bed alarm set           Time: 0867-6195 PT Time Calculation (min): 15 min   Charges:   PT Evaluation $Initial PT Evaluation Tier I: 1 Procedure PT Treatments $Gait Training: 8-22 mins   PT G Codes:          Lurie Mullane,KATHrine E 03/10/2014, 12:38 PM Carmelia Bake, PT, DPT 03/10/2014 Pager: [phone]

## 2014-03-10 NOTE — Plan of Care (Signed)
Problem: Consults Goal: Diabetes Guidelines if Diabetic/Glucose > 140 If diabetic or lab glucose is > 140 mg/dl - Initiate Diabetes/Hyperglycemia Guidelines & Document Interventions  Outcome: Completed/Met Date Met:  03/10/14 On SSI

## 2014-03-11 LAB — GLUCOSE, CAPILLARY
GLUCOSE-CAPILLARY: 242 mg/dL — AB (ref 70–99)
GLUCOSE-CAPILLARY: 101 mg/dL — AB (ref 70–99)
GLUCOSE-CAPILLARY: 130 mg/dL — AB (ref 70–99)
Glucose-Capillary: 149 mg/dL — ABNORMAL HIGH (ref 70–99)

## 2014-03-11 LAB — BASIC METABOLIC PANEL
ANION GAP: 13 (ref 5–15)
BUN: 25 mg/dL — ABNORMAL HIGH (ref 6–23)
CO2: 26 mEq/L (ref 19–32)
CREATININE: 0.97 mg/dL (ref 0.50–1.10)
Calcium: 10.1 mg/dL (ref 8.4–10.5)
Chloride: 102 mEq/L (ref 96–112)
GFR, EST AFRICAN AMERICAN: 65 mL/min — AB (ref >90)
GFR, EST NON AFRICAN AMERICAN: 56 mL/min — AB (ref >90)
Glucose, Bld: 87 mg/dL (ref 70–99)
Potassium: 3.8 mEq/L (ref 3.7–5.3)
SODIUM: 141 meq/L (ref 137–147)

## 2014-03-11 LAB — PROTIME-INR
INR: 3.06 — ABNORMAL HIGH (ref 0.00–1.49)
Prothrombin Time: 31.6 seconds — ABNORMAL HIGH (ref 11.6–15.2)

## 2014-03-11 LAB — URINE CULTURE: Colony Count: >=100000

## 2014-03-11 MED ORDER — SODIUM CHLORIDE 0.9 % IV SOLN
INTRAVENOUS | Status: DC
  Administered 2014-03-12: 03:00:00 via INTRAVENOUS

## 2014-03-11 NOTE — Progress Notes (Signed)
Patient ID: [name], female   DOB: September 20, 1935, 78 y.o.   MRN: [id number] [facility] PROGRESS NOTE  [name] [id number]:[id number] DOB: 1935/12/27 DOA: 03/09/2014 PCP: [name], MD  Brief narrative: 78 y.o. female with history of multiple myeloma, on chemotherapy, diabetes, DVT on Coumadin who presented to [facility] ED 03/09/2014 with altered mental status, difficulty speaking, unusual behavior. On admission, evaluation included CT head which was essentially unremarkable for acute changes. Chest x-ray showed possible infiltrates in left lung base. She was started on azithromycin and Rocephin for treatment of pneumonia.   Assessment/Plan:   Principal Problem:  Acute encephalopathy, delirium  CT head did not show acute intracranial findings. Patient is being treated for possible pneumonia which could potentially cause mental status changes.  Per PT - PT follow up not required; needs rolling walker but she has it at home per her daughter  Active Problems:  CAP (community acquired pneumonia)  Started on azithromycin and Rocephin  Oxygen support via nasal cannula to keep oxygen saturation above 90% MULTIPLE MYELOMA  Continue Decadron, prednisone per previous home regimen  She is on acyclovir for HSV prophylaxis History of DVT  On Coumadin. Dosing per pharmacy.   DVT prophylaxis: On therapeutic anticoagulation with Coumadin    Code Status: DNR/DNI  Family Communication: plan of care discussed with the patient  Disposition Plan: home when stable   Consultants:  None Procedures:  None Antibiotics:  Azithromycin 03/10/2014 -->  Rocephin 03/10/2014 -->   Code Status: full code  Family Communication: plan of care discussed with the patient and her daughter at the bedside  Disposition Plan: home when stable   Leisa Lenz, MD  [facility] Pager [phone]  If 7PM-7AM, please contact night-coverage [email or web address] Password [email or web address] 03/11/2014, 9:00 AM   LOS: 2 days    HPI/Subjective: No acute overnight events.  Objective: Filed Vitals:   03/10/14 0451 03/10/14 1401 03/10/14 2144 03/11/14 0446  BP: 124/46 150/62 144/65 142/57  Pulse: 69 64 83 52  Temp: 99.1 F (37.3 C) 98 F (36.7 C) 98.1 F (36.7 C) 98 F (36.7 C)  TempSrc: Oral Oral Oral Oral  Resp: $Remo'16 16 16 16  'SsWVM$ Height: $Rem'4\' 10"'VfbQ$  (1.473 m)     Weight: 54.386 kg (119 lb 14.4 oz)     SpO2: 99% 100% 100% 98%    Intake/Output Summary (Last 24 hours) at 03/11/14 0900 Last data filed at 03/11/14 0600  Gross per 24 hour  Intake 1140.83 ml  Output      0 ml  Net 1140.83 ml    Exam:   General:  Pt is alert,  not in acute distress  Cardiovascular: Regular rate and rhythm, S1/S2, no murmurs  Respiratory: Coarse breath sounds but no wheezing  Abdomen: Soft, non tender, non distended, bowel sounds present  Extremities: No edema, pulses DP and PT palpable bilaterally  Neuro: Grossly nonfocal  Data Reviewed: Basic Metabolic Panel:  Recent Labs Lab 03/09/14 2349  NA 138  K 4.1  CL 98  CO2 27  GLUCOSE 110*  BUN 21  CREATININE 1.53*  CALCIUM 10.3   Liver Function Tests:  Recent Labs Lab 03/09/14 2349  AST 14  ALT 13  ALKPHOS 51  BILITOT 0.3  PROT 6.4  ALBUMIN 2.8*   No results found for this basename: LIPASE, AMYLASE,  in the last 168 hours No results found for this basename: AMMONIA,  in the last 168 hours CBC:  Recent Labs Lab 03/09/14 2349  WBC 10.9*  NEUTROABS 8.9*  HGB 10.5*  HCT 33.2*  MCV 97.4  PLT 223   Cardiac Enzymes: No results found for this basename: CKTOTAL, CKMB, CKMBINDEX, TROPONINI,  in the last 168 hours BNP: No components found with this basename: POCBNP,  CBG:  Recent Labs Lab 03/10/14 0724 03/10/14 1220 03/10/14 1359 03/10/14 1723 03/10/14 2106  GLUCAP 148* 132* 145* 254* 109*    CULTURE, RESPIRATORY (NON-EXPECTORATED)     Status: None   Collection Time    03/10/14  6:28 AM      Result Value Ref Range Status   Specimen  Description SPUTUM   Final   Special Requests NONE   Final   Gram Stain     Final   Value: FEW WBC PRESENT,BOTH PMN AND MONONUCLEAR     RARE SQUAMOUS EPITHELIAL CELLS PRESENT     FEW GRAM POSITIVE COCCI IN PAIRS     IN CLUSTERS RARE GRAM POSITIVE RODS     RARE GRAM NEGATIVE RODS   Culture PENDING   Incomplete   Report Status PENDING   Incomplete     Studies: Ct Head Wo Contrast 03/10/2014     IMPRESSION: No acute intracranial process.  Similar involutional changes, mild to moderate white matter changes suggest chronic small vessel ischemic disease.  Acute paranasal sinusitis.   Electronically Signed   By: Elon Alas   On: 03/10/2014 00:36   Dg Chest Port 1 View 03/10/2014     IMPRESSION: Chronic interstitial changes in the lungs. Infiltration or atelectasis in the left lung base   Electronically Signed   By: Lucienne Capers M.D.   On: 03/10/2014 00:08    Scheduled Meds: . acyclovir  400 mg Oral BID  . ALPRAZolam  0.5 mg Oral BID  . azithromycin  500 mg Intravenous Q24H  . calcium-vitamin D  1 tablet Oral TID WC  . cefTRIAXone   1 g Intravenous Q24H  . dexamethasone  40 mg Oral Weekly  . dextromethorphan-guai  1 tablet Oral BID  . glipiZIDE  2.5 mg Oral Q breakfast  . insulin aspart  0-15 Units Subcutaneous TID WC  . OxyCODONE  10 mg Oral Q12H  . PredniSONE  20 mg Oral Weekly  . Warfarin    Does not apply q1800   Continuous Infusions: . sodium chloride 50 mL/hr at 03/11/14 0345

## 2014-03-11 NOTE — Progress Notes (Signed)
ANTICOAGULATION CONSULT NOTE - Follow Up  Pharmacy Consult for warfarin Indication: DVT  No Known Allergies  Patient Measurements: Height: 4\' 10"  (147.3 cm) Weight: 119 lb 14.4 oz (54.386 kg) IBW/kg (Calculated) : 40.9  Vital Signs: Temp: 98 F (36.7 C) (07/11 0446) Temp src: Oral (07/11 0446) BP: 142/57 mmHg (07/11 0446) Pulse Rate: 52 (07/11 0446)  Labs:  Recent Labs  03/09/14 2349 03/10/14 0500 03/11/14 0407  HGB 10.5*  --   --   HCT 33.2*  --   --   PLT 223  --   --   LABPROT 28.0* 31.7* 31.6*  INR 2.62* 3.07* 3.06*  CREATININE 1.53*  --   --    Estimated Creatinine Clearance: 23.2 ml/min (by C-G formula based on Cr of 1.53).  Medications:  Prescriptions prior to admission  Medication Sig Dispense Refill  . acyclovir (ZOVIRAX) 400 MG tablet Take 400 mg by mouth 2 (two) times daily.       Marland Kitchen ALPRAZolam (XANAX) 0.5 MG tablet Take 1 tablet (0.5 mg total) by mouth 2 (two) times daily as needed for anxiety.  60 tablet  0  . Calcium Carbonate-Vitamin D 600-400 MG-UNIT per tablet Take 1 tablet by mouth 3 (three) times daily with meals.        Marland Kitchen dexamethasone (DECADRON) 4 MG tablet Take 40 mg by mouth once a week.      Marland Kitchen dextromethorphan-guaiFENesin (MUCINEX DM) 30-600 MG per 12 hr tablet Take 1 tablet by mouth 2 (two) times daily.      Marland Kitchen glipiZIDE (GLUCOTROL XL) 2.5 MG 24 hr tablet Take 2.5 mg by mouth daily with breakfast.      . lisinopril-hydrochlorothiazide (PRINZIDE,ZESTORETIC) 20-12.5 MG per tablet Take 0.5 tablets by mouth daily.      Marland Kitchen loperamide (IMODIUM A-D) 2 MG tablet Take 1 tablet (2 mg total) by mouth 4 (four) times daily as needed for diarrhea or loose stools.      Marland Kitchen oxyCODONE (OXY IR/ROXICODONE) 5 MG immediate release tablet Take 1 tablet (5 mg total) by mouth every 6 (six) hours as needed for severe pain or breakthrough pain. Take AS DIRECTED BY PHARMACIST IN COUMADIN CLINIC.  90 tablet  0  . OxyCODONE (OXYCONTIN) 10 mg T12A 12 hr tablet Take 1 tablet (10  mg total) by mouth every 12 (twelve) hours.  60 tablet  0  . pomalidomide (POMALYST) 4 MG capsule Take 1 capsule (4 mg total) by mouth daily. Take with water on days 1-21. Repeat every 28 days  21 capsule  0  . predniSONE (DELTASONE) 20 MG tablet Take 20 mg by mouth once a week.      . temazepam (RESTORIL) 30 MG capsule Take 30 mg by mouth at bedtime as needed for sleep.      Marland Kitchen warfarin (COUMADIN) 5 MG tablet Take 2.5-5 mg by mouth daily. Takes 1(5mg ) tablet on mondays, Wednesday. Takes 1/2(2.5mg ) tablet on Tuesday,thursday, Friday, Saturday and sunday      . glucose blood (ONE TOUCH ULTRA TEST) test strip Use as instructed  25 each  6  . Lancets (ONETOUCH ULTRASOFT) lancets Test blood sugar once daily or as directed.  100 each  11   Assessment: 30 yoF admit with AMS changes. Head CT negative for acute process, CXray with L lung infiltrates, treat for CAP with Rocephin/Azithromycin. Hx of Multiple Myeloma on Pomalidomide- not resumed. Hx of DVT on chronic Warfarin; home dose 5mg  on M,W; 2.5mg  other days, last dose 7/9 (day of admission).  Admit INR 2.62, 3.07 yesterday-no Warfarin given, 3.06 today  Regular diet ordered, on azithromycin which can increase INR  Goal of Therapy:  INR 2-3   Plan:   Continue to hold Warfarin today  Daily PT/INR  Minda Ditto PharmD Pager [phone] 03/11/2014, 10:20 AM

## 2014-03-12 LAB — BASIC METABOLIC PANEL
Anion gap: 10 (ref 5–15)
BUN: 27 mg/dL — ABNORMAL HIGH (ref 6–23)
CO2: 29 mEq/L (ref 19–32)
CREATININE: 1 mg/dL (ref 0.50–1.10)
Calcium: 9.9 mg/dL (ref 8.4–10.5)
Chloride: 107 mEq/L (ref 96–112)
GFR calc Af Amer: 62 mL/min — ABNORMAL LOW (ref >90)
GFR calc non Af Amer: 54 mL/min — ABNORMAL LOW (ref >90)
GLUCOSE: 97 mg/dL (ref 70–99)
Potassium: 3.6 mEq/L — ABNORMAL LOW (ref 3.7–5.3)
Sodium: 146 mEq/L (ref 137–147)

## 2014-03-12 LAB — PROTIME-INR
INR: 2.69 — ABNORMAL HIGH (ref 0.00–1.49)
PROTHROMBIN TIME: 28.6 s — AB (ref 11.6–15.2)

## 2014-03-12 LAB — CULTURE, RESPIRATORY

## 2014-03-12 LAB — CULTURE, RESPIRATORY W GRAM STAIN: Culture: NORMAL

## 2014-03-12 LAB — GLUCOSE, CAPILLARY: GLUCOSE-CAPILLARY: 117 mg/dL — AB (ref 70–99)

## 2014-03-12 MED ORDER — TEMAZEPAM 30 MG PO CAPS: 30.0000 mg | ORAL_CAPSULE | Freq: Every evening | ORAL | Status: DC | PRN

## 2014-03-12 MED ORDER — POTASSIUM CHLORIDE CRYS ER 20 MEQ PO TBCR
40.0000 meq | EXTENDED_RELEASE_TABLET | Freq: Once | ORAL | Status: AC
  Administered 2014-03-12: 40 meq via ORAL
  Filled 2014-03-12: qty 2

## 2014-03-12 MED ORDER — LEVOFLOXACIN 750 MG PO TABS: 750.0000 mg | ORAL_TABLET | ORAL | Status: DC

## 2014-03-12 MED ORDER — OXYCODONE HCL 5 MG PO TABS: 5.0000 mg | ORAL_TABLET | Freq: Four times a day (QID) | ORAL | Status: DC | PRN

## 2014-03-12 MED ORDER — WARFARIN SODIUM 2.5 MG PO TABS
2.5000 mg | ORAL_TABLET | Freq: Once | ORAL | Status: DC
  Filled 2014-03-12: qty 1

## 2014-03-12 NOTE — Progress Notes (Signed)
ANTICOAGULATION CONSULT NOTE - Follow Up  Pharmacy Consult for warfarin Indication: DVT  No Known Allergies  Patient Measurements: Height: 4\' 10"  (147.3 cm) Weight: 119 lb 14.4 oz (54.386 kg) IBW/kg (Calculated) : 40.9  Vital Signs: Temp: 98.3 F (36.8 C) (07/12 0526) Temp src: Oral (07/12 0526) BP: 138/55 mmHg (07/12 0526) Pulse Rate: 59 (07/12 0526)  Labs:  Recent Labs  03/09/14 2349 03/10/14 0500 03/11/14 0407 03/11/14 1315 03/12/14 0415  HGB 10.5*  --   --   --   --   HCT 33.2*  --   --   --   --   PLT 223  --   --   --   --   LABPROT 28.0* 31.7* 31.6*  --  28.6*  INR 2.62* 3.07* 3.06*  --  2.69*  CREATININE 1.53*  --   --  0.97 1.00   Estimated Creatinine Clearance: 35.5 ml/min (by C-G formula based on Cr of 1).  Medications:  Prescriptions prior to admission  Medication Sig Dispense Refill  . acyclovir (ZOVIRAX) 400 MG tablet Take 400 mg by mouth 2 (two) times daily.       Marland Kitchen ALPRAZolam (XANAX) 0.5 MG tablet Take 1 tablet (0.5 mg total) by mouth 2 (two) times daily as needed for anxiety.  60 tablet  0  . Calcium Carbonate-Vitamin D 600-400 MG-UNIT per tablet Take 1 tablet by mouth 3 (three) times daily with meals.        Marland Kitchen dexamethasone (DECADRON) 4 MG tablet Take 40 mg by mouth once a week.      Marland Kitchen dextromethorphan-guaiFENesin (MUCINEX DM) 30-600 MG per 12 hr tablet Take 1 tablet by mouth 2 (two) times daily.      Marland Kitchen glipiZIDE (GLUCOTROL XL) 2.5 MG 24 hr tablet Take 2.5 mg by mouth daily with breakfast.      . lisinopril-hydrochlorothiazide (PRINZIDE,ZESTORETIC) 20-12.5 MG per tablet Take 0.5 tablets by mouth daily.      Marland Kitchen loperamide (IMODIUM A-D) 2 MG tablet Take 1 tablet (2 mg total) by mouth 4 (four) times daily as needed for diarrhea or loose stools.      . OxyCODONE (OXYCONTIN) 10 mg T12A 12 hr tablet Take 1 tablet (10 mg total) by mouth every 12 (twelve) hours.  60 tablet  0  . pomalidomide (POMALYST) 4 MG capsule Take 1 capsule (4 mg total) by mouth daily.  Take with water on days 1-21. Repeat every 28 days  21 capsule  0  . predniSONE (DELTASONE) 20 MG tablet Take 20 mg by mouth once a week.      . warfarin (COUMADIN) 5 MG tablet Take 2.5-5 mg by mouth daily. Takes 1(5mg ) tablet on mondays, Wednesday. Takes 1/2(2.5mg ) tablet on Tuesday,thursday, Friday, Saturday and sunday      . [DISCONTINUED] oxyCODONE (OXY IR/ROXICODONE) 5 MG immediate release tablet Take 1 tablet (5 mg total) by mouth every 6 (six) hours as needed for severe pain or breakthrough pain. Take AS DIRECTED BY PHARMACIST IN COUMADIN CLINIC.  90 tablet  0  . [DISCONTINUED] temazepam (RESTORIL) 30 MG capsule Take 30 mg by mouth at bedtime as needed for sleep.      Marland Kitchen glucose blood (ONE TOUCH ULTRA TEST) test strip Use as instructed  25 each  6  . Lancets (ONETOUCH ULTRASOFT) lancets Test blood sugar once daily or as directed.  100 each  11   Assessment: 28 yoF admit with AMS changes. Head CT negative for acute process, CXray with L lung  infiltrates, treat for CAP with Rocephin/Azithromycin. Hx of Multiple Myeloma on Pomalidomide- not resumed. Hx of DVT on chronic Warfarin; home dose $RemoveB'5mg'rGWWfisT$  on M,W; 2.$RemoveB'5mg'UJNPDWPm$  other days, last dose 7/9 (day of admission).  Admit INR 2.62, followed by 3.07 & 3.06   Regular diet ordered, poor po intake  On azithromycin which can increase INR  INR today in goal range at 2.69  Goal of Therapy:  INR 2-3   Plan:   Resume Warfarin today with 2.$RemoveBefo'5mg'MwkZkjZyOHZ$  dose at 1800  Daily PT/INR  Minda Ditto PharmD Pager [phone] 03/12/2014, 9:23 AM

## 2014-03-12 NOTE — Discharge Summary (Signed)
Physician Discharge Summary  [name] JZP:[id number] DOB: 22-Dec-1935 DOA: 03/09/2014  PCP: [name], MD  Admit date: 03/09/2014 Discharge date: 03/12/2014  Recommendations for Outpatient Follow-up:  1. May resume coumadin per home regimen, tonight 2.5 mg. Will recheck INR in next 2-3 days per PCP. Patient's daughter was instructed to have pt recheck INR per PCP. INR was therapeutic today. Pharmacist would have given 2.5 mg tonight had pt stayed additional day. 2. May continue levaquin every 48 hours for 3 more doses on discharge to complete treatment of pneumonia.  Discharge Diagnoses:  Principal Problem:   CAP (community acquired pneumonia) Active Problems:   MULTIPLE  MYELOMA   Delirium    Discharge Condition: stable   Diet recommendation: as tolerated   History of present illness:  78 y.o. female with history of multiple myeloma, on chemotherapy, diabetes, DVT on Coumadin who presented to [facility] ED 03/09/2014 with altered mental status, difficulty speaking, unusual behavior. On admission, evaluation included CT head which was essentially unremarkable for acute changes. Chest x-ray showed possible infiltrates in left lung base. She was started on azithromycin and Rocephin for treatment of pneumonia.   Assessment/Plan:   Principal Problem:  Acute encephalopathy, delirium  CT head did not show acute intracranial findings. Patient is being treated for possible pneumonia which could potentially cause mental status changes.  Per PT - PT follow up not required; needs rolling walker but she has it at home per her daughter  Active Problems:  CAP (community acquired pneumonia)  Started on azithromycin and Rocephin. Levaquin on discharge for 3 more doses. Oxygen sat 100%. No need for [place] on discharge  MULTIPLE MYELOMA  Continue Decadron, prednisone per previous home regimen  She is on acyclovir for HSV prophylaxis History of DVT  On Coumadin. May resume home dosing. Tonight 2.5 mg and  then per home dosing. She will see her PCP in next 2-3 days to recheck INR.   DVT prophylaxis: On therapeutic anticoagulation with Coumadin   Code Status: DNR/DNI  Family Communication: plan of care discussed with the patient and her daughter at the bedside   Consultants:  None Procedures:  None Antibiotics:  Azithromycin 03/10/2014 --> 03/12/2014 Rocephin 03/10/2014 --> 03/12/2014   Signed:  Leisa Lenz, MD  [facility] 03/12/2014, 10:56 AM  Pager #: [phone]   Discharge Exam: Filed Vitals:   03/12/14 0526  BP: 138/55  Pulse: 59  Temp: 98.3 F (36.8 C)  Resp: 16   Filed Vitals:   03/11/14 0446 03/11/14 1456 03/11/14 2052 03/12/14 0526  BP: 142/57 147/78 167/54 138/55  Pulse: 52 58 50 59  Temp: 98 F (36.7 C) 97.3 F (36.3 C) 97.9 F (36.6 C) 98.3 F (36.8 C)  TempSrc: Oral Oral Oral Oral  Resp: _0 Height:      Weight:      SpO2: 98% 100% 100% 100%    General: Pt is alert, follows commands appropriately, not in acute distress Cardiovascular: Regular rate and rhythm, S1/S2 +, no murmurs Respiratory: Clear to auscultation bilaterally, no wheezing, no crackles, no rhonchi Abdominal: Soft, non tender, non distended, bowel sounds +, no guarding Extremities: no edema, no cyanosis, pulses palpable bilaterally DP and PT Neuro: Grossly nonfocal  Discharge Instructions     Medication List    STOP taking these medications       lisinopril-hydrochlorothiazide 20-12.5 MG per tablet  Commonly known as:  PRINZIDE,ZESTORETIC      TAKE these medications  acyclovir 400 MG tablet  Commonly known as:  ZOVIRAX  Take 400 mg by mouth 2 (two) times daily.     acyclovir 400 MG tablet  Commonly known as:  ZOVIRAX  TAKE ONE TABLET BY MOUTH TWICE DAILY     ALPRAZolam 0.5 MG tablet  Commonly known as:  XANAX  Take 1 tablet (0.5 mg total) by mouth 2 (two) times daily as needed for anxiety.     Calcium Carbonate-Vitamin D 600-400 MG-UNIT per  tablet  Take 1 tablet by mouth 3 (three) times daily with meals.     dexamethasone 4 MG tablet  Commonly known as:  DECADRON  Take 40 mg by mouth once a week.     dextromethorphan-guaiFENesin 30-600 MG per 12 hr tablet  Commonly known as:  MUCINEX DM  Take 1 tablet by mouth 2 (two) times daily.     glipiZIDE 2.5 MG 24 hr tablet  Commonly known as:  GLUCOTROL XL  Take 2.5 mg by mouth daily with breakfast.     glucose blood test strip  Commonly known as:  ONE TOUCH ULTRA TEST  Use as instructed     levofloxacin 750 MG tablet  Commonly known as:  LEVAQUIN  Take 1 tablet (750 mg total) by mouth every other day.     loperamide 2 MG tablet  Commonly known as:  IMODIUM A-D  Take 1 tablet (2 mg total) by mouth 4 (four) times daily as needed for diarrhea or loose stools.     onetouch ultrasoft lancets  Test blood sugar once daily or as directed.     OxyCODONE 10 mg T12a 12 hr tablet  Commonly known as:  OXYCONTIN  Take 1 tablet (10 mg total) by mouth every 12 (twelve) hours.     oxyCODONE 5 MG immediate release tablet  Commonly known as:  Oxy IR/ROXICODONE  Take 1 tablet (5 mg total) by mouth every 6 (six) hours as needed for severe pain or breakthrough pain. Take AS DIRECTED BY PHARMACIST IN COUMADIN CLINIC.     pomalidomide 4 MG capsule  Commonly known as:  POMALYST  Take 1 capsule (4 mg total) by mouth daily. Take with water on days 1-21. Repeat every 28 days     predniSONE 20 MG tablet  Commonly known as:  DELTASONE  Take 20 mg by mouth once a week.     temazepam 30 MG capsule  Commonly known as:  RESTORIL  Take 1 capsule (30 mg total) by mouth at bedtime as needed for sleep.      ASK your doctor about these medications       warfarin 5 MG tablet  Commonly known as:  COUMADIN  Take 2.5-5 mg by mouth daily. Takes 1(33m) tablet on mondays, Wednesday. Takes 1/2(2.560m tablet on Tuesday,thursday, Friday, Saturday and sunday          The results of significant  diagnostics from this hospitalization (including imaging, microbiology, ancillary and laboratory) are listed below for reference.    Significant Diagnostic Studies: Ct Head Wo Contrast  03/10/2014   CLINICAL DATA:  Altered mental status.  EXAM: CT HEAD WITHOUT CONTRAST  TECHNIQUE: Contiguous axial images were obtained from the base of the skull through the vertex without intravenous contrast.  COMPARISON:  CT of the head September 08, 2013  FINDINGS: The ventricles and sulci are normal for age. No intraparenchymal hemorrhage, mass effect nor midline shift. Patchy supratentorial white matter hypodensities are within normal range for patient's age and though non-specific  suggest sequelae of chronic small vessel ischemic disease. No acute large vascular territory infarcts.  No abnormal extra-axial fluid collections. Basal cisterns are patent. Moderate calcific atherosclerosis of the carotid siphons.  No skull fracture. The included ocular globes and orbital contents are non-suspicious. Status post bilateral ocular lens implants. Bilateral maxillary sinus air-fluid levels, sphenoid sinus air-fluid levels.  IMPRESSION: No acute intracranial process.  Similar involutional changes, mild to moderate white matter changes suggest chronic small vessel ischemic disease.  Acute paranasal sinusitis.   Electronically Signed   By: Elon Alas   On: 03/10/2014 00:36   Ct Soft Tissue Neck W Contrast  03/01/2014   CLINICAL DATA:  Left facial subcutaneous nodule.  EXAM: CT NECK WITH CONTRAST  TECHNIQUE: Multidetector CT imaging of the neck was performed using the standard protocol following the bolus administration of intravenous contrast.  CONTRAST:  64m OMNIPAQUE IOHEXOL 300 MG/ML  SOLN  COMPARISON:  CT scan cervical spine 06/09/2008 that includes the area of concern.  FINDINGS: Lung apices show benign-appearing scarring. No superior mediastinal lesion. There is atherosclerosis of the aorta. Visualized intracranial contents  do not show any abnormality.  Both parotid glands are normal. Both submandibular glands are normal. The thyroid gland is normal.  In the soft tissues of the left face, superficial to the posterior body of the mandible, there is a well-circumscribed mass measuring 17-18 mm in diameter. The mass is slightly heterogeneous and does contain an area of fat density. No invasion of the deep tissues. The mass comes very close to the scan. This is consistent with a benign entity such as sebaceous cyst. Enlarged lymph node is possible. Other soft tissue masses are felt unlikely.  No mucosal or submucosal lesion. There is atherosclerotic disease of both carotid bifurcations. No stenosis is seen on the left. Both cervical internal carotid arteries are markedly tortuous. The right internal carotid artery is narrow throughout its course with a diameter of only 2.5 mm.  IMPRESSION: Well-circumscribed mass within the subcutaneous fat superficial to the posterior body of the mandible on the left. Internally, there are areas of fat density. Therefore, this is presumed to represent a sebaceous cyst. It is not consistent with an aggressive lesion.   Electronically Signed   By: MNelson ChimesM.D.   On: 03/01/2014 17:10   Dg Chest Port 1 View  03/10/2014   CLINICAL DATA:  Hypoxia.  Altered mental status.  EXAM: PORTABLE CHEST - 1 VIEW  COMPARISON:  09/08/2013  FINDINGS: Shallow inspiration. Heart size and pulmonary vascularity are normal. Central interstitial changes again demonstrated. Hazy opacity in the left lung base may represent atelectasis or infiltration. Old fracture deformity of the distal left clavicle.  IMPRESSION: Chronic interstitial changes in the lungs. Infiltration or atelectasis in the left lung base   Electronically Signed   By: WLucienne CapersM.D.   On: 03/10/2014 00:08    Microbiology: Recent Results (from the past 240 hour(s))  URINE CULTURE     Status: None   Collection Time    03/10/14 12:06 AM       Result Value Ref Range Status   Specimen Description URINE, RANDOM   Final   Special Requests NONE   Final   Culture  Setup Time     Final   Value: 03/10/2014 09:17     [place]formed at [facility]    Final   Value: >=100,000 COLONIES/ML     [facility]formed at [facility]  Culture     Final   Value: LACTOBACILLUS SPECIES     Note: Standardized susceptibility testing for this organism is not available.     Performed at [facility]   Report Status 03/11/2014 FINAL   Final  CULTURE, BLOOD (ROUTINE X 2)     Status: None   Collection Time    03/10/14  2:09 AM      Result Value Ref Range Status   Specimen Description BLOOD LEFT ANTECUBITAL   Final   Special Requests BOTTLES DRAWN AEROBIC AND ANAEROBIC 5CC   Final   Culture  Setup Time     Final   Value: 03/10/2014 08:54     Performed at [facility]   Culture     Final   Value:        BLOOD CULTURE RECEIVED NO GROWTH TO DATE CULTURE WILL BE HELD FOR 5 DAYS BEFORE ISSUING A FINAL NEGATIVE REPORT     Performed at [facility]   Report Status PENDING   Incomplete  CULTURE, BLOOD (ROUTINE X 2)     Status: None   Collection Time    03/10/14  2:19 AM      Result Value Ref Range Status   Specimen Description BLOOD RIGHT HAND   Final   Special Requests BOTTLES DRAWN AEROBIC AND ANAEROBIC 4CC   Final   Culture  Setup Time     Final   Value: 03/10/2014 08:54     Performed at [facility]   Culture     Final   Value:        BLOOD CULTURE RECEIVED NO GROWTH TO DATE CULTURE WILL BE HELD FOR 5 DAYS BEFORE ISSUING A FINAL NEGATIVE REPORT     Performed at [facility]   Report Status PENDING   Incomplete  CULTURE, EXPECTORATED SPUTUM-ASSESSMENT     Status: None   Collection Time    03/10/14  6:28 AM      Result Value Ref Range Status   Specimen Description SPUTUM   Final   Special Requests NONE   Final   Sputum evaluation     Final   Value: THIS SPECIMEN IS ACCEPTABLE.  RESPIRATORY CULTURE REPORT TO FOLLOW.   Report Status 03/10/2014 FINAL   Final  CULTURE, RESPIRATORY (NON-EXPECTORATED)     Status: None   Collection Time    03/10/14  6:28 AM      Result Value Ref Range Status   Specimen Description SPUTUM   Final   Special Requests NONE   Final   Gram Stain     Final   Value: FEW WBC PRESENT,BOTH PMN AND MONONUCLEAR     RARE SQUAMOUS EPITHELIAL CELLS PRESENT     FEW GRAM POSITIVE COCCI IN PAIRS     IN CLUSTERS RARE GRAM POSITIVE RODS     RARE GRAM NEGATIVE RODS   Culture     Final   Value: NORMAL OROPHARYNGEAL FLORA     Performed at [facility]   Report Status PENDING   Incomplete     Labs: Basic Metabolic Panel:  Recent Labs Lab 03/09/14 2349 03/11/14 1315 03/12/14 0415  NA 138 141 146  K 4.1 3.8 3.6*  CL 98 102 107  CO2 _0 GLUCOSE 110* 87 97  BUN 21 25* 27*  CREATININE 1.53* 0.97 1.00  CALCIUM 10.3 10.1 9.9   Liver Function Tests:  Recent Labs Lab 03/09/14 2349  AST 14  ALT  13  ALKPHOS 51  BILITOT 0.3  PROT 6.4  ALBUMIN 2.8*   No results found for this basename: LIPASE, AMYLASE,  in the last 168 hours No results found for this basename: AMMONIA,  in the last 168 hours CBC:  Recent Labs Lab 03/09/14 2349  WBC 10.9*  NEUTROABS 8.9*  HGB 10.5*  HCT 33.2*  MCV 97.4  PLT 223   Cardiac Enzymes: No results found for this basename: CKTOTAL, CKMB, CKMBINDEX, TROPONINI,  in the last 168 hours BNP: BNP (last 3 results) No results found for this basename: PROBNP,  in the last 8760 hours CBG:  Recent Labs Lab 03/11/14 0755 03/11/14 1225 03/11/14 1704 03/11/14 2056 03/12/14 0728  GLUCAP 242* 149* 101* 130* 117*    Time coordinating discharge: Over 30 minutes

## 2014-03-12 NOTE — Progress Notes (Signed)
Patient d/c home. Stable.Sandie Ano RN

## 2014-03-12 NOTE — Progress Notes (Signed)
Discharge instructions discussed with patient's daughter,teach back utilized. I anserwed all her questions appropriately. Prescriptions provided to daughter. Appreciative of care given to patient. Patient is stable,ambulatory in rrom with walker,no c/o SOB, sats 97% on RA.Took all po meds withoput difficulty.Sandie Ano RN

## 2014-03-13 ENCOUNTER — Telehealth: Payer: Self-pay | Admitting: *Deleted

## 2014-03-13 NOTE — Care Management Note (Signed)
    Page 1 of 1   03/13/2014     11:18:26 AM CARE MANAGEMENT NOTE 03/13/2014  Patient:  [name], [name]   Account Number:  [id number]  Date Initiated:  03/10/2014  Documentation initiated by:  Gabriel Earing  Subjective/Objective Assessment:   pt admitted with cco     Action/Plan:   Anticipated DC Date:  03/12/2014   Anticipated DC Plan:  HOME/SELF CARE         Choice offered to / List presented to:             Status of service:  Completed, signed off Medicare Important Message given?  NA - LOS <3 / Initial given by admissions (If response is "NO", the following Medicare IM given date fields will be blank) Date Medicare IM given:   Medicare IM given by:   Date Additional Medicare IM given:   Additional Medicare IM given by:    Discharge Disposition:  HOME/SELF CARE  Per UR Regulation:  Reviewed for med. necessity/level of care/duration of stay  If discussed at [place] of Stay Meetings, dates discussed:    Comments:

## 2014-03-13 NOTE — Telephone Encounter (Signed)
Pt's daughter, [name] called wanting to know whether patient should r/s pomalyst now that she is d/c from the hospital.  Per Dr Vista Mink, okay for pt to restart pomalyst.  [name] verbalized understanding.  SLJ

## 2014-03-14 ENCOUNTER — Telehealth: Payer: Self-pay | Admitting: Family Medicine

## 2014-03-14 NOTE — Telephone Encounter (Signed)
Diabetic Bundle.  Pt needs lab appt for LDL.  Left vm for pt to return call.

## 2014-03-16 LAB — CULTURE, BLOOD (ROUTINE X 2)
CULTURE: NO GROWTH
Culture: NO GROWTH

## 2014-03-20 ENCOUNTER — Other Ambulatory Visit: Payer: Self-pay | Admitting: Family Medicine

## 2014-03-20 NOTE — Telephone Encounter (Signed)
Electronic refill request. Looks like this was prescribed by an MD in the hospital.  Please advise.

## 2014-03-21 ENCOUNTER — Other Ambulatory Visit (HOSPITAL_BASED_OUTPATIENT_CLINIC_OR_DEPARTMENT_OTHER): Payer: Medicare Other

## 2014-03-21 DIAGNOSIS — C9000 Multiple myeloma not having achieved remission: Secondary | ICD-10-CM

## 2014-03-21 DIAGNOSIS — R229 Localized swelling, mass and lump, unspecified: Secondary | ICD-10-CM

## 2014-03-21 LAB — CBC WITH DIFFERENTIAL/PLATELET
BASO%: 0.5 % (ref 0.0–2.0)
Basophils Absolute: 0 10*3/uL (ref 0.0–0.1)
EOS%: 5.3 % (ref 0.0–7.0)
Eosinophils Absolute: 0.2 10*3/uL (ref 0.0–0.5)
HCT: 32.5 % — ABNORMAL LOW (ref 34.8–46.6)
HGB: 10.3 g/dL — ABNORMAL LOW (ref 11.6–15.9)
LYMPH%: 19.3 % (ref 14.0–49.7)
MCH: 30.5 pg (ref 25.1–34.0)
MCHC: 31.7 g/dL (ref 31.5–36.0)
MCV: 96.2 fL (ref 79.5–101.0)
MONO#: 0.4 10*3/uL (ref 0.1–0.9)
MONO%: 9.8 % (ref 0.0–14.0)
NEUT%: 65.1 % (ref 38.4–76.8)
NEUTROS ABS: 2.5 10*3/uL (ref 1.5–6.5)
NRBC: 0 % (ref 0–0)
PLATELETS: 287 10*3/uL (ref 145–400)
RBC: 3.38 10*6/uL — AB (ref 3.70–5.45)
RDW: 15.3 % — AB (ref 11.2–14.5)
WBC: 3.8 10*3/uL — AB (ref 3.9–10.3)
lymph#: 0.7 10*3/uL — ABNORMAL LOW (ref 0.9–3.3)

## 2014-03-21 LAB — COMPREHENSIVE METABOLIC PANEL (CC13)
ALT: 11 U/L (ref 0–55)
AST: 13 U/L (ref 5–34)
Albumin: 3 g/dL — ABNORMAL LOW (ref 3.5–5.0)
Alkaline Phosphatase: 49 U/L (ref 40–150)
Anion Gap: 9 mEq/L (ref 3–11)
BUN: 18.5 mg/dL (ref 7.0–26.0)
CALCIUM: 9.6 mg/dL (ref 8.4–10.4)
CHLORIDE: 103 meq/L (ref 98–109)
CO2: 27 mEq/L (ref 22–29)
CREATININE: 1 mg/dL (ref 0.6–1.1)
Glucose: 132 mg/dl (ref 70–140)
Potassium: 3.4 mEq/L — ABNORMAL LOW (ref 3.5–5.1)
SODIUM: 140 meq/L (ref 136–145)
TOTAL PROTEIN: 6.4 g/dL (ref 6.4–8.3)
Total Bilirubin: 0.5 mg/dL (ref 0.20–1.20)

## 2014-03-21 LAB — LACTATE DEHYDROGENASE (CC13): LDH: 228 U/L (ref 125–245)

## 2014-03-21 NOTE — Telephone Encounter (Signed)
We had started that.  Okay to fill.  Sent.  Thanks.

## 2014-03-22 ENCOUNTER — Ambulatory Visit: Payer: Medicare Other | Admitting: Family Medicine

## 2014-03-22 ENCOUNTER — Ambulatory Visit (INDEPENDENT_AMBULATORY_CARE_PROVIDER_SITE_OTHER): Payer: Medicare Other | Admitting: Family Medicine

## 2014-03-22 ENCOUNTER — Encounter: Payer: Self-pay | Admitting: Family Medicine

## 2014-03-22 VITALS — BP 120/70 | HR 74 | Temp 98.7°F | Wt 124.5 lb

## 2014-03-22 DIAGNOSIS — I82401 Acute embolism and thrombosis of unspecified deep veins of right lower extremity: Secondary | ICD-10-CM

## 2014-03-22 DIAGNOSIS — J189 Pneumonia, unspecified organism: Secondary | ICD-10-CM

## 2014-03-22 LAB — POCT INR: INR: 3.3

## 2014-03-22 NOTE — Patient Instructions (Signed)
Your INR was 3.3 and I wouldn't change your coumadin dose.  Your lungs sound clear today and the cough will likely gradually improve.  Keep the follow up appointment at the cancer next week and notify us if the cough gets worse.  Take care.  Glad to see you.

## 2014-03-22 NOTE — Progress Notes (Signed)
Pre visit review using our clinic review tool, if applicable. No additional management support is needed unless otherwise documented below in the visit note.  Admitted with PNA recently, abx use noted, done with abx now.  Still on coumadin.  Reasonable to check INR today.  No bleeding. Still deconditioned from baseline but able to be at home with family.  Cough is some better, not fully resolved, d/w pt and family re: typical timeline  No sputum.  Appetite is fair.  Still on baseline meds. Hospital records, labs/imaging reviewed and d/w pt/daughter.    PMH and SH reviewed  ROS: See HPI, otherwise noncontributory.  Meds, vitals, and allergies reviewed.   Nad, oriented to self and knows the president, but not the date today (family had noted similar since PNA dx and this doesn't appear to be acutely worsening). Op wnl, MMM Neck supple, no LA rrr ctab No inc in wob, no cough abd soft Ext with trace edema.

## 2014-03-23 LAB — BETA 2 MICROGLOBULIN, SERUM: Beta-2 Microglobulin: 3.93 mg/L — ABNORMAL HIGH (ref <=2.51)

## 2014-03-23 LAB — KAPPA/LAMBDA LIGHT CHAINS
KAPPA FREE LGHT CHN: 0.79 mg/dL (ref 0.33–1.94)
Kappa:Lambda Ratio: 0.01 — ABNORMAL LOW (ref 0.26–1.65)
Lambda Free Lght Chn: 60.6 mg/dL — ABNORMAL HIGH (ref 0.57–2.63)

## 2014-03-23 LAB — IGG, IGA, IGM
IGG (IMMUNOGLOBIN G), SERUM: 527 mg/dL — AB (ref 690–1700)
IgA: 103 mg/dL (ref 69–380)
IgM, Serum: 200 mg/dL (ref 52–322)

## 2014-03-23 NOTE — Assessment & Plan Note (Signed)
Appears resolved on chest exam. No focal dec in BS, no inc in wob, no fevers.  AMS returning to baseline. Family agrees. Overall improved. Would expect gradual improvement in cough.  All questions answers.  INR rechecked today, see instructions.  Wouldn't change baseline meds, steroids, etc. Call back as needed. >25 minutes spent in face to face time with patient, >50% spent in counselling or coordination of care.

## 2014-03-24 ENCOUNTER — Other Ambulatory Visit: Payer: Self-pay | Admitting: Medical Oncology

## 2014-03-24 ENCOUNTER — Other Ambulatory Visit: Payer: Self-pay | Admitting: *Deleted

## 2014-03-24 DIAGNOSIS — C9000 Multiple myeloma not having achieved remission: Secondary | ICD-10-CM

## 2014-03-24 MED ORDER — OXYCODONE HCL 5 MG PO TABS: 5.0000 mg | ORAL_TABLET | Freq: Four times a day (QID) | ORAL | Status: DC | PRN

## 2014-03-24 NOTE — Telephone Encounter (Signed)
Requests refill  oxyir . Also reports that pt has been confused and behaving inappropriately -taking her clothes off , repeatedly washing her face. She is also going into the bathroom frequently and sitting  on commode , saying she has to go to bathroom . Myrtie Hawk stated the pt denies UTI symptoms. Per Dr Julien Nordmann I instructed Myrtie Hawk to take her to ED for evaluation .

## 2014-03-24 NOTE — Telephone Encounter (Signed)
THIS REFILL REQUEST FOR POMALYST WAS GIVEN TO DR.MOHAMED'S NURSE, DIANE BELL,RN.

## 2014-03-27 ENCOUNTER — Other Ambulatory Visit: Payer: Self-pay | Admitting: Pharmacist

## 2014-03-27 ENCOUNTER — Ambulatory Visit: Payer: Medicare Other | Admitting: Family Medicine

## 2014-03-27 DIAGNOSIS — Z0289 Encounter for other administrative examinations: Secondary | ICD-10-CM

## 2014-03-27 DIAGNOSIS — Z7901 Long term (current) use of anticoagulants: Secondary | ICD-10-CM

## 2014-03-28 ENCOUNTER — Ambulatory Visit: Payer: Medicare Other

## 2014-03-28 ENCOUNTER — Encounter: Payer: Self-pay | Admitting: Internal Medicine

## 2014-03-28 ENCOUNTER — Ambulatory Visit (HOSPITAL_BASED_OUTPATIENT_CLINIC_OR_DEPARTMENT_OTHER): Payer: Self-pay | Admitting: Pharmacist

## 2014-03-28 ENCOUNTER — Other Ambulatory Visit (HOSPITAL_BASED_OUTPATIENT_CLINIC_OR_DEPARTMENT_OTHER): Payer: Medicare Other

## 2014-03-28 ENCOUNTER — Encounter: Payer: Self-pay | Admitting: *Deleted

## 2014-03-28 ENCOUNTER — Ambulatory Visit (HOSPITAL_BASED_OUTPATIENT_CLINIC_OR_DEPARTMENT_OTHER): Payer: Medicare Other | Admitting: Internal Medicine

## 2014-03-28 ENCOUNTER — Other Ambulatory Visit: Payer: Self-pay | Admitting: Medical Oncology

## 2014-03-28 VITALS — BP 131/60 | HR 64 | Temp 98.6°F | Resp 17 | Ht <= 58 in | Wt 123.1 lb

## 2014-03-28 DIAGNOSIS — I82401 Acute embolism and thrombosis of unspecified deep veins of right lower extremity: Secondary | ICD-10-CM

## 2014-03-28 DIAGNOSIS — Z7901 Long term (current) use of anticoagulants: Secondary | ICD-10-CM

## 2014-03-28 DIAGNOSIS — C9000 Multiple myeloma not having achieved remission: Secondary | ICD-10-CM

## 2014-03-28 DIAGNOSIS — E876 Hypokalemia: Secondary | ICD-10-CM

## 2014-03-28 DIAGNOSIS — I82409 Acute embolism and thrombosis of unspecified deep veins of unspecified lower extremity: Secondary | ICD-10-CM

## 2014-03-28 LAB — COMPREHENSIVE METABOLIC PANEL (CC13)
ALK PHOS: 53 U/L (ref 40–150)
ALT: 13 U/L (ref 0–55)
AST: 13 U/L (ref 5–34)
Albumin: 2.8 g/dL — ABNORMAL LOW (ref 3.5–5.0)
Anion Gap: 11 mEq/L (ref 3–11)
BILIRUBIN TOTAL: 0.31 mg/dL (ref 0.20–1.20)
BUN: 14.6 mg/dL (ref 7.0–26.0)
CO2: 29 mEq/L (ref 22–29)
Calcium: 10.1 mg/dL (ref 8.4–10.4)
Chloride: 103 mEq/L (ref 98–109)
Creatinine: 0.9 mg/dL (ref 0.6–1.1)
Glucose: 130 mg/dl (ref 70–140)
Potassium: 3 mEq/L — CL (ref 3.5–5.1)
Sodium: 143 mEq/L (ref 136–145)
Total Protein: 6.1 g/dL — ABNORMAL LOW (ref 6.4–8.3)

## 2014-03-28 LAB — CBC WITH DIFFERENTIAL/PLATELET
BASO%: 1.9 % (ref 0.0–2.0)
Basophils Absolute: 0.1 10*3/uL (ref 0.0–0.1)
EOS%: 0.8 % (ref 0.0–7.0)
Eosinophils Absolute: 0 10*3/uL (ref 0.0–0.5)
HCT: 32.2 % — ABNORMAL LOW (ref 34.8–46.6)
HGB: 10.2 g/dL — ABNORMAL LOW (ref 11.6–15.9)
LYMPH%: 20.9 % (ref 14.0–49.7)
MCH: 30.1 pg (ref 25.1–34.0)
MCHC: 31.7 g/dL (ref 31.5–36.0)
MCV: 95 fL (ref 79.5–101.0)
MONO#: 0.4 10*3/uL (ref 0.1–0.9)
MONO%: 10.6 % (ref 0.0–14.0)
NEUT#: 2.4 10*3/uL (ref 1.5–6.5)
NEUT%: 65.8 % (ref 38.4–76.8)
PLATELETS: 298 10*3/uL (ref 145–400)
RBC: 3.39 10*6/uL — AB (ref 3.70–5.45)
RDW: 16.3 % — ABNORMAL HIGH (ref 11.2–14.5)
WBC: 3.7 10*3/uL — ABNORMAL LOW (ref 3.9–10.3)
lymph#: 0.8 10*3/uL — ABNORMAL LOW (ref 0.9–3.3)

## 2014-03-28 LAB — PROTIME-INR
INR: 2.3 (ref 2.00–3.50)
Protime: 27.6 Seconds — ABNORMAL HIGH (ref 10.6–13.4)

## 2014-03-28 LAB — LACTATE DEHYDROGENASE (CC13): LDH: 218 U/L (ref 125–245)

## 2014-03-28 LAB — POCT INR: INR: 2.2

## 2014-03-28 MED ORDER — POTASSIUM CHLORIDE CRYS ER 20 MEQ PO TBCR: 20.0000 meq | EXTENDED_RELEASE_TABLET | Freq: Every day | ORAL | Status: DC

## 2014-03-28 NOTE — Progress Notes (Signed)
[place] Telephone:([phone]   [phone] OFFICE PROGRESS NOTE  [name], MD [place] [address]  DIAGNOSIS:  1. multiple myeloma diagnosed in September 2009,  2. history of bilateral lower extremity deep vein thrombosis diagnosed in November 2009  3. acute on chronic deep vein thrombosis in the left lower extremity diagnosed in October 2010.   PRIOR THERAPY:  1. status post palliative radiotherapy to the right femur and is she'll tuberosity, first was done in December 2011 and the second treatment was completed Jan 08 2011, and the third treatment to the right distal femur completed on 04/30/2011  2. status post palliative radiotherapy to the left proximal femur completed 02/28/2011.  3. Revlimid 25 mg by mouth daily for 21 days every 4 weeks in addition to oral Decadron at 40 mg by mouth on a weekly basis status post 40 cycles.  4. weekly subcutaneous Velcade 1.3 mg/M2 status post 60 cycles, discontinued today secondary to mild disease progression. 5. Pomalyst 4 mg by mouth daily for 21 days every 4 weeks, the patient started the first dose on 12/22/2012 , in addition to Decadron 40 mg weekly. Status post 13 cycles.   CURRENT THERAPY:  1. Coumadin 5 mg on Mondays and Thursdays and 2.5 mg on all other days.  3. Zometa 4 mg IV given every 3 months.    INTERVAL HISTORY: [name] 78 y.o. female returns to the clinic today for followup visit accompanied by her 2 daughter. She was recently discharged from [facility] after treatment for community acquired pneumonia. Her doctors mentioned that the patient has a lot of confusion recently especially after his discharge from the hospital. They decided to hold her long acting OxyContin and the patient currently receives oxycodone 5 mg by mouth every 6 hours as needed. She is doing much better with less confusion over the last few days. She is feeling fine today except  for fatigue and weakness. She noticed a lump on the left side of the face started few weeks and CT scan of the neck was suspicious for sebaceous cyst. She continues to have the low back pain with radiation to the lower extremity and she is currently on pain medication oxycodone 4 times a day. She denied having any significant nausea or vomiting. The patient denied having any significant weight loss or night sweats. She has no chest pain, shortness of breath, cough or hemoptysis. She had repeat myeloma panel performed recently and she is here for evaluation and discussion of her lab results and treatment options.  MEDICAL HISTORY: Past Medical History  Diagnosis Date  . Blood transfusion   . Depression     Mild  . History of chicken pox   . Hyperlipidemia   . Hypertension   . Degenerative joint disease   . Phlebitis   . Multiple myeloma     per Dr. Julien Nordmann, s/p palliative radiation for leg pain 2011 per Dr. Sondra Come  . Deep vein thrombosis of bilateral lower extremities   . Family history of anesthesia complication     DAUGHTER CPR AFTER  . Cardiomyopathy   . Diabetes mellitus     Steroid related  . UTI (urinary tract infection) 09/08/2013  . History of radiation therapy 08/30/13-09/20/13    20 gray to lower lumbar/upper sacrum    ALLERGIES:  has No Known Allergies.  MEDICATIONS:  Current Outpatient Prescriptions  Medication Sig Dispense Refill  .  acyclovir (ZOVIRAX) 400 MG tablet TAKE ONE TABLET BY MOUTH TWICE DAILY  60 tablet  0  . ALPRAZolam (XANAX) 0.5 MG tablet Take 1 tablet (0.5 mg total) by mouth 2 (two) times daily as needed for anxiety.  60 tablet  0  . Calcium Carbonate-Vitamin D 600-400 MG-UNIT per tablet Take 1 tablet by mouth 3 (three) times daily with meals.        Marland Kitchen dexamethasone (DECADRON) 4 MG tablet Take 40 mg by mouth once a week.      Marland Kitchen dextromethorphan-guaiFENesin (MUCINEX DM) 30-600 MG per 12 hr tablet Take 1 tablet by mouth 2 (two) times daily.      Marland Kitchen glipiZIDE  (GLUCOTROL XL) 2.5 MG 24 hr tablet Take 2.5 mg by mouth daily with breakfast.      . glucose blood (ONE TOUCH ULTRA TEST) test strip Use as instructed  25 each  6  . Lancets (ONETOUCH ULTRASOFT) lancets Test blood sugar once daily or as directed.  100 each  11  . lisinopril-hydrochlorothiazide (PRINZIDE,ZESTORETIC) 20-12.5 MG per tablet Take 1/2 by mouth daily      . oxyCODONE (OXY IR/ROXICODONE) 5 MG immediate release tablet Take 1 tablet (5 mg total) by mouth every 6 (six) hours as needed for severe pain.  60 tablet  0  . pomalidomide (POMALYST) 4 MG capsule Take 1 capsule (4 mg total) by mouth daily. Take with water on days 1-21. Repeat every 28 days  21 capsule  0  . predniSONE (DELTASONE) 20 MG tablet Take 20 mg by mouth once a week.      . temazepam (RESTORIL) 30 MG capsule Take 1 capsule (30 mg total) by mouth at bedtime as needed for sleep.  3 capsule  0  . warfarin (COUMADIN) 5 MG tablet Take 2.5-5 mg by mouth daily. Takes 1(5mg ) tablet on mondays, Wednesday. Takes 1/2(2.5mg ) tablet on Tuesday,thursday, Friday, Saturday and sunday      . loperamide (IMODIUM A-D) 2 MG tablet Take 1 tablet (2 mg total) by mouth 4 (four) times daily as needed for diarrhea or loose stools.      . OxyCODONE (OXYCONTIN) 10 mg T12A 12 hr tablet Take 1 tablet (10 mg total) by mouth every 12 (twelve) hours.  60 tablet  0  . prochlorperazine (COMPAZINE) 10 MG tablet        No current facility-administered medications for this visit.   Facility-Administered Medications Ordered in Other Visits  Medication Dose Route Frequency Provider Last Rate Last Dose  . Zoledronic Acid (ZOMETA) 4 mg IVPB  4 mg Intravenous Once Curt Bears, MD        SURGICAL HISTORY:  Past Surgical History  Procedure Laterality Date  . Abdominal hysterectomy    . Ankle fracture surgery Left     REVIEW OF SYSTEMS:  Constitutional: positive for fatigue Eyes: negative Ears, nose, mouth, throat, and face: negative Respiratory:  negative Cardiovascular: negative Gastrointestinal: negative Genitourinary:negative Integument/breast: negative Hematologic/lymphatic: negative Musculoskeletal:positive for back pain Neurological: positive for paresthesia Behavioral/Psych: negative Endocrine: negative Allergic/Immunologic: negative   PHYSICAL EXAMINATION: General appearance: alert, cooperative, fatigued and no distress Head: Normocephalic, without obvious abnormality, atraumatic, Subcutaneous nodule on the left side of the face. Neck: no adenopathy, no JVD, supple, symmetrical, trachea midline and thyroid not enlarged, symmetric, no tenderness/mass/nodules Lymph nodes: Cervical, supraclavicular, and axillary nodes normal. Resp: clear to auscultation bilaterally Back: symmetric, no curvature. ROM normal. No CVA tenderness. Cardio: regular rate and rhythm, S1, S2 normal, no murmur, click, rub or gallop GI:  soft, non-tender; bowel sounds normal; no masses,  no organomegaly Extremities: extremities normal, atraumatic, no cyanosis or edema Neurologic: Alert and oriented X 3, normal strength and tone. Normal symmetric reflexes. Normal coordination and gait  ECOG PERFORMANCE STATUS: 2 - Symptomatic, <50% confined to bed  Blood pressure 131/60, pulse 64, temperature 98.6 F (37 C), temperature source Oral, resp. rate 17, height _0  (1.473 m), weight 123 lb 1.6 oz (55.838 kg).  LABORATORY DATA: Lab Results  Component Value Date   WBC 3.7* 03/28/2014   HGB 10.2* 03/28/2014   HCT 32.2* 03/28/2014   MCV 95.0 03/28/2014   PLT 298 03/28/2014      Chemistry      Component Value Date/Time   NA 140 03/21/2014 1414   NA 146 03/12/2014 0415   K 3.4* 03/21/2014 1414   K 3.6* 03/12/2014 0415   CL 107 03/12/2014 0415   CL 107 02/14/2013 1311   CO2 27 03/21/2014 1414   CO2 29 03/12/2014 0415   BUN 18.5 03/21/2014 1414   BUN 27* 03/12/2014 0415   CREATININE 1.0 03/21/2014 1414   CREATININE 1.00 03/12/2014 0415      Component Value  Date/Time   CALCIUM 9.6 03/21/2014 1414   CALCIUM 9.9 03/12/2014 0415   CALCIUM  Value: 5.7 CRITICAL RESULT CALLED TO, READ BACK BY AND VERIFIED WITH: [facility] [id number] @ [address] (NOTE)  Amended report. Result repeated and verified. CORRECTED ON 10/14 AT 1429: PREVIOUSLY REPORTED AS Result repeated and verified.* 06/13/2008 1605   ALKPHOS 49 03/21/2014 1414   ALKPHOS 51 03/09/2014 2349   AST 13 03/21/2014 1414   AST 14 03/09/2014 2349   ALT 11 03/21/2014 1414   ALT 13 03/09/2014 2349   BILITOT 0.50 03/21/2014 1414   BILITOT 0.3 03/09/2014 2349     Other lab results: Beta-2 microglobulin 3.93, free kappa light chain 0.79, free lambda light chain 60.60 and kappa/lambda ratio 0.01. IgG 527, IgA 103 and IgM 200.  RADIOGRAPHIC STUDIES: No results found.  ASSESSMENT AND PLAN: This is a very pleasant 78 years old African American female with relapsed multiple myeloma currently treatment with Pomalyst 4 mg by mouth daily for 21 days every 4 weeks status post 13 cycles. The patient is tolerating her treatment fairly well.  She was supposed to start cycle #13 in early June of 2015  Her recent myeloma panel showed evidence for disease progression. I discussed the lab result with the patient and her family. I recommended for her to discontinue her current treatment with Pomalyst. I discussed with the patient other treatment options including enrollment in a [name] clinical trial with treatment with Elotuzumab, Revlimid and Decadron. I discussed the adverse effects of this treatment with the patient and the patient and her family are interested in the trial. The patient will be seen by the clinical research nurse today for eligibility evaluation. If the patient is not eligible for the trial, I would consider her for treatment with Carfilzomib, Cytoxan and Decadron. The patient would come back for followup visit in one week or according to the protocol requirements. For pain management, she would  continue on oxycodone on as-needed basis. The patient was advised to call immediately if she has any concerning symptoms in the interval. The patient voices understanding of current disease status and treatment options and is in agreement with the current care plan.  All questions were answered. The patient knows to call the clinic with any problems, questions or concerns.  We can certainly see the patient much sooner if necessary.  Disclaimer: This note was dictated with voice recognition software. Similar sounding words can inadvertently be transcribed and may not be corrected upon review.

## 2014-03-28 NOTE — Progress Notes (Signed)
INR = 2.2 on Coumadin 2.5 mg daily except 5 mg on Mon/Wed. Just was in hospital w/ pneumonia.  She has completed Abx & she is on her usual dose of steroids. INR was checked 7/22 at her PCP office = 3.3 & no dose adjustments were made at that visit. No bleeding/bruising.  No hematuria. INR at goal.  Continue same dose of Coumadin without change. Return in 1 month for protime. Kennith Center, Pharm.D., CPP 03/28/2014@2 :37 PM

## 2014-04-05 ENCOUNTER — Other Ambulatory Visit: Payer: Self-pay | Admitting: Internal Medicine

## 2014-04-05 ENCOUNTER — Other Ambulatory Visit: Payer: Self-pay | Admitting: *Deleted

## 2014-04-05 ENCOUNTER — Telehealth: Payer: Self-pay | Admitting: *Deleted

## 2014-04-05 DIAGNOSIS — C9000 Multiple myeloma not having achieved remission: Secondary | ICD-10-CM

## 2014-04-05 MED ORDER — DEXAMETHASONE 4 MG PO TABS: 4.0000 mg | ORAL_TABLET | ORAL | Status: DC

## 2014-04-05 MED ORDER — ALPRAZOLAM 0.5 MG PO TABS: 0.5000 mg | ORAL_TABLET | Freq: Two times a day (BID) | ORAL | Status: DC | PRN

## 2014-04-05 MED ORDER — OXYCODONE HCL 5 MG PO CAPS: 5.0000 mg | ORAL_CAPSULE | Freq: Four times a day (QID) | ORAL | Status: DC | PRN

## 2014-04-05 NOTE — Telephone Encounter (Signed)
Approval for Xanax refill. Denied Decadron-may be going on protocol soon and tx will be changed.

## 2014-04-05 NOTE — Telephone Encounter (Signed)
Revlimid [employer] risk assessment packet filled out, reviewed and signed by pt.  Copy of signed packet given to patient.  Pt's 2 daughters present.  Will wait to hear from Rolley Sims to process the order for revlimid.

## 2014-04-06 MED ORDER — LENALIDOMIDE 25 MG PO CAPS: 25.0000 mg | ORAL_CAPSULE | Freq: Every day | ORAL | Status: DC

## 2014-04-06 NOTE — Addendum Note (Signed)
Addended by: Britt Bottom on: 04/06/2014 11:29 AM   Modules accepted: Orders

## 2014-04-06 NOTE — Telephone Encounter (Signed)
Pt is signed up for [employer] [facility]ist.  Rx faxed with new auth # to [facility] pharmacy.  SLJ

## 2014-04-07 ENCOUNTER — Telehealth: Payer: Self-pay | Admitting: Medical Oncology

## 2014-04-07 NOTE — Telephone Encounter (Signed)
I faxed last office note and med list to [facility] per their request.

## 2014-04-10 ENCOUNTER — Telehealth: Payer: Self-pay | Admitting: Medical Oncology

## 2014-04-10 ENCOUNTER — Telehealth: Payer: Self-pay | Admitting: *Deleted

## 2014-04-10 NOTE — Telephone Encounter (Signed)
Requested return call to discuss taking decadron at home the morning of treatment.

## 2014-04-10 NOTE — Telephone Encounter (Signed)
Phone call - encounter closed. 

## 2014-04-10 NOTE — Telephone Encounter (Signed)
Per research I have scheduled aappt for 8/13

## 2014-04-10 NOTE — Telephone Encounter (Signed)
Revlimid approved through 04/08/2015. And [facility] sent confirmation of shipment of revlimid to pt.

## 2014-04-11 ENCOUNTER — Other Ambulatory Visit: Payer: Self-pay | Admitting: *Deleted

## 2014-04-11 ENCOUNTER — Other Ambulatory Visit: Payer: Self-pay | Admitting: Medical Oncology

## 2014-04-11 DIAGNOSIS — C9000 Multiple myeloma not having achieved remission: Secondary | ICD-10-CM

## 2014-04-11 MED ORDER — DEXAMETHASONE 4 MG PO TABS: 4.0000 mg | ORAL_TABLET | ORAL | Status: DC

## 2014-04-11 NOTE — Telephone Encounter (Signed)
New decadron rx sent to pharmacy . [name] notified of new rx and directions  And to start Thursday am 04/13/14.

## 2014-04-12 ENCOUNTER — Telehealth: Payer: Self-pay | Admitting: Internal Medicine

## 2014-04-12 NOTE — Telephone Encounter (Signed)
ADDED INF APPT FOR 8/20, 8/27 AND 9/3 PER 8/10 POF. OTHER APPTS ON SCHEDULE REMAIN THE SAME. PT HAS NO LAB ORDERS FOR THIS TX AND NO FOLLOW UP APPOINTMENT ON SCHEUDLE. MESSAGE TO MARY IN RESEARCH RE THIS. CALLED PRIMARY NUMBER FOR DTR KIM RE ADDITIONAL APPTS BUT WAS NOT ABLE TO REACH HER OR LM. ADDED COMMENT TO 8/13 APPT NOTE TO SEND PT FOR NEW Blyn.

## 2014-04-13 ENCOUNTER — Encounter: Payer: Self-pay | Admitting: *Deleted

## 2014-04-13 ENCOUNTER — Other Ambulatory Visit: Payer: Self-pay | Admitting: Internal Medicine

## 2014-04-13 ENCOUNTER — Ambulatory Visit (HOSPITAL_BASED_OUTPATIENT_CLINIC_OR_DEPARTMENT_OTHER): Payer: Medicare Other

## 2014-04-13 VITALS — BP 139/56 | HR 76 | Temp 98.3°F | Resp 18

## 2014-04-13 DIAGNOSIS — Z5112 Encounter for antineoplastic immunotherapy: Secondary | ICD-10-CM

## 2014-04-13 DIAGNOSIS — C9000 Multiple myeloma not having achieved remission: Secondary | ICD-10-CM

## 2014-04-13 MED ORDER — DEXAMETHASONE SODIUM PHOSPHATE 10 MG/ML IJ SOLN
8.0000 mg | Freq: Once | INTRAMUSCULAR | Status: AC
  Administered 2014-04-13: 8 mg via INTRAVENOUS

## 2014-04-13 MED ORDER — ACETAMINOPHEN 325 MG PO TABS
ORAL_TABLET | ORAL | Status: AC
  Filled 2014-04-13: qty 2

## 2014-04-13 MED ORDER — SODIUM CHLORIDE 0.9 % IV SOLN
10.0000 mg/kg | Freq: Once | INTRAVENOUS | Status: AC
  Administered 2014-04-13: 565 mg via INTRAVENOUS
  Filled 2014-04-13: qty 22.6

## 2014-04-13 MED ORDER — DEXAMETHASONE SODIUM PHOSPHATE 10 MG/ML IJ SOLN
INTRAMUSCULAR | Status: AC
  Filled 2014-04-13: qty 1

## 2014-04-13 MED ORDER — DIPHENHYDRAMINE HCL 25 MG PO CAPS
50.0000 mg | ORAL_CAPSULE | Freq: Once | ORAL | Status: AC
  Administered 2014-04-13: 50 mg via ORAL

## 2014-04-13 MED ORDER — SODIUM CHLORIDE 0.9 % IV SOLN
Freq: Once | INTRAVENOUS | Status: AC
  Administered 2014-04-13: 10:00:00 via INTRAVENOUS

## 2014-04-13 MED ORDER — ACETAMINOPHEN 325 MG PO TABS
ORAL_TABLET | ORAL | Status: AC
  Filled 2014-04-13: qty 1

## 2014-04-13 MED ORDER — DIPHENHYDRAMINE HCL 25 MG PO CAPS
ORAL_CAPSULE | ORAL | Status: AC
  Filled 2014-04-13: qty 2

## 2014-04-13 MED ORDER — ACETAMINOPHEN 325 MG PO TABS
650.0000 mg | ORAL_TABLET | Freq: Once | ORAL | Status: AC
  Administered 2014-04-13: 650 mg via ORAL

## 2014-04-13 MED ORDER — FAMOTIDINE IN NACL 20-0.9 MG/50ML-% IV SOLN
INTRAVENOUS | Status: AC
  Filled 2014-04-13: qty 50

## 2014-04-13 MED ORDER — FAMOTIDINE IN NACL 20-0.9 MG/50ML-% IV SOLN
20.0000 mg | Freq: Once | INTRAVENOUS | Status: AC
  Administered 2014-04-13: 20 mg via INTRAVENOUS

## 2014-04-13 NOTE — Progress Notes (Unsigned)
04/13/14 @ 9:40 am, BMS CA 204-143, Cycle 1, Day 1:  Mrs. Sanmiguel into the [facility] for her first elotuzumab.  Her daughter reported that she took dexamethasone 28 mg p.o. today at 6:45 am with food, as directed.  Her weight was assessed at 56.4 kg and the dose of elotuzumab was calculated using that weight.   Spoke with Ladean Raya, RN and Rudene Anda, RN in the infusion room about patient's treatment for today.  Provided them with a sign delineating the titration of the drug, the timing of the pre-medications and the required use of a 0.22 micron filter.  Also, provided them with information and grading of infusion reactions.  Will call the research nurse if patient should experience an infusion reaction of any grade.  Obtained kit assignment via [employer] and gave those to [name], PharmD. Informed the pharmacy that the dose in EPIC is calculated using today's rate and is the dose that will be used for the entire cycle. Asked Dr. Julien Nordmann to approve orders.

## 2014-04-13 NOTE — Progress Notes (Signed)
04/13/14 BMS [id number] Cycle 1 Day 1. [name] took the first dose of revlimid this morning. Treatment schedule and calendar given to [name], [name].   [place] 1, Day 8  [name] is in the cancer center today for day 8 treatment on the BMS CA204-143 clinical trial. She and [name] verify that she took 7 tablets of decadron this morning at 7:00. She is continuing to take revlimid daily.  Signs for infusion given to V. Jacquelynn Cree, RN  and J. Montgomery, RN with emphasis on the use of a filter, required titration of the second dose, and timing of pre-medications and IP.   Cycle 1, Day 77 [name] is in the cancer center today for treatment on the BMS CA204-143 study. She confirms that she took decadron this morning and is taking revlimid.  Sign for infusion given to A. Quentin Cornwall, RN with emphasis on the use of a filter, and timing of pre-medications and IP.   Cycle 1, Day 22. [name] is in the cancer center today accompanied by [name], [name]. They both confirmed that she took 28mg  of decadron this morning 2 hours before they left for the cancer center. She took the last dose of rev;imid yesterday. Revlimid has been reordered for the nest cycle. Gave [name] and [name] a copy of a medication calender for the next cycle. Reviewed the medications to begin next week on Day1 of cycle 2.  Sign for infusion given to J. Doyle Askew, RN. Reviewed the use of a filter and timing of pre-medications.

## 2014-04-14 ENCOUNTER — Telehealth: Payer: Self-pay | Admitting: *Deleted

## 2014-04-14 NOTE — Telephone Encounter (Signed)
Denies any adverse events from chemotherapy. Eating and drinking well, actually says she feels better.

## 2014-04-18 ENCOUNTER — Encounter: Payer: Self-pay | Admitting: Pharmacist

## 2014-04-18 NOTE — Progress Notes (Signed)
Patient's family asked if 8/25 lab and Coumadin clinic visits could be moved to 8/27 when she is here for treatment. Ok'd with Stanton Kidney (Research RN), may cancel lab and coumadin clinic on 04/25/14 and add lab and Coumadin clinic visit to schedule on 04/27/14.

## 2014-04-19 ENCOUNTER — Other Ambulatory Visit: Payer: Self-pay | Admitting: Internal Medicine

## 2014-04-19 DIAGNOSIS — C9002 Multiple myeloma in relapse: Secondary | ICD-10-CM

## 2014-04-20 ENCOUNTER — Ambulatory Visit (HOSPITAL_BASED_OUTPATIENT_CLINIC_OR_DEPARTMENT_OTHER): Payer: Medicare Other

## 2014-04-20 ENCOUNTER — Encounter: Payer: Self-pay | Admitting: *Deleted

## 2014-04-20 ENCOUNTER — Ambulatory Visit: Payer: Medicare Other

## 2014-04-20 VITALS — BP 114/51 | HR 61 | Temp 98.2°F | Resp 16

## 2014-04-20 DIAGNOSIS — C9000 Multiple myeloma not having achieved remission: Secondary | ICD-10-CM

## 2014-04-20 DIAGNOSIS — Z5112 Encounter for antineoplastic immunotherapy: Secondary | ICD-10-CM

## 2014-04-20 LAB — COMPREHENSIVE METABOLIC PANEL (CC13)
ALT: 15 U/L (ref 0–55)
ANION GAP: 13 meq/L — AB (ref 3–11)
AST: 19 U/L (ref 5–34)
Albumin: 3.4 g/dL — ABNORMAL LOW (ref 3.5–5.0)
Alkaline Phosphatase: 55 U/L (ref 40–150)
BILIRUBIN TOTAL: 0.3 mg/dL (ref 0.20–1.20)
BUN: 11.5 mg/dL (ref 7.0–26.0)
CO2: 28 mEq/L (ref 22–29)
CREATININE: 1 mg/dL (ref 0.6–1.1)
Calcium: 10 mg/dL (ref 8.4–10.4)
Chloride: 100 mEq/L (ref 98–109)
GLUCOSE: 219 mg/dL — AB (ref 70–140)
Potassium: 3.1 mEq/L — ABNORMAL LOW (ref 3.5–5.1)
Sodium: 141 mEq/L (ref 136–145)
Total Protein: 6.5 g/dL (ref 6.4–8.3)

## 2014-04-20 LAB — CBC WITH DIFFERENTIAL/PLATELET
BASO%: 0.2 % (ref 0.0–2.0)
Basophils Absolute: 0 10*3/uL (ref 0.0–0.1)
EOS ABS: 0.1 10*3/uL (ref 0.0–0.5)
EOS%: 2.8 % (ref 0.0–7.0)
HCT: 35.8 % (ref 34.8–46.6)
HEMOGLOBIN: 11.2 g/dL — AB (ref 11.6–15.9)
LYMPH%: 4.8 % — ABNORMAL LOW (ref 14.0–49.7)
MCH: 29.6 pg (ref 25.1–34.0)
MCHC: 31.3 g/dL — ABNORMAL LOW (ref 31.5–36.0)
MCV: 94.7 fL (ref 79.5–101.0)
MONO#: 0.1 10*3/uL (ref 0.1–0.9)
MONO%: 1 % (ref 0.0–14.0)
NEUT#: 4.5 10*3/uL (ref 1.5–6.5)
NEUT%: 91.2 % — ABNORMAL HIGH (ref 38.4–76.8)
PLATELETS: 278 10*3/uL (ref 145–400)
RBC: 3.78 10*6/uL (ref 3.70–5.45)
RDW: 15.6 % — ABNORMAL HIGH (ref 11.2–14.5)
WBC: 5 10*3/uL (ref 3.9–10.3)
lymph#: 0.2 10*3/uL — ABNORMAL LOW (ref 0.9–3.3)

## 2014-04-20 LAB — PROTHROMBIN TIME
INR: 2.73 — ABNORMAL HIGH (ref <1.50)
Prothrombin Time: 28.9 seconds — ABNORMAL HIGH (ref 11.6–15.2)

## 2014-04-20 LAB — LACTATE DEHYDROGENASE (CC13): LDH: 259 U/L — AB (ref 125–245)

## 2014-04-20 MED ORDER — FAMOTIDINE IN NACL 20-0.9 MG/50ML-% IV SOLN
INTRAVENOUS | Status: AC
  Filled 2014-04-20: qty 50

## 2014-04-20 MED ORDER — FAMOTIDINE IN NACL 20-0.9 MG/50ML-% IV SOLN
20.0000 mg | Freq: Once | INTRAVENOUS | Status: AC
  Administered 2014-04-20: 20 mg via INTRAVENOUS

## 2014-04-20 MED ORDER — DEXAMETHASONE SODIUM PHOSPHATE 10 MG/ML IJ SOLN
INTRAMUSCULAR | Status: AC
  Filled 2014-04-20: qty 1

## 2014-04-20 MED ORDER — DIPHENHYDRAMINE HCL 50 MG/ML IJ SOLN
INTRAMUSCULAR | Status: AC
  Filled 2014-04-20: qty 1

## 2014-04-20 MED ORDER — ACETAMINOPHEN 325 MG PO TABS
ORAL_TABLET | ORAL | Status: AC
  Filled 2014-04-20: qty 2

## 2014-04-20 MED ORDER — SODIUM CHLORIDE 0.9 % IV SOLN
10.0000 mg/kg | Freq: Once | INTRAVENOUS | Status: AC
  Administered 2014-04-20: 565 mg via INTRAVENOUS
  Filled 2014-04-20: qty 22.6

## 2014-04-20 MED ORDER — DIPHENHYDRAMINE HCL 25 MG PO CAPS
ORAL_CAPSULE | ORAL | Status: AC
  Filled 2014-04-20: qty 2

## 2014-04-20 MED ORDER — DEXAMETHASONE SODIUM PHOSPHATE 10 MG/ML IJ SOLN
8.0000 mg | Freq: Once | INTRAMUSCULAR | Status: AC
  Administered 2014-04-20: 8 mg via INTRAVENOUS

## 2014-04-20 MED ORDER — SODIUM CHLORIDE 0.9 % IV SOLN
Freq: Once | INTRAVENOUS | Status: AC
  Administered 2014-04-20: 11:00:00 via INTRAVENOUS

## 2014-04-20 MED ORDER — DIPHENHYDRAMINE HCL 25 MG PO CAPS
50.0000 mg | ORAL_CAPSULE | Freq: Once | ORAL | Status: AC
  Administered 2014-04-20: 50 mg via ORAL

## 2014-04-20 MED ORDER — ACETAMINOPHEN 325 MG PO TABS
650.0000 mg | ORAL_TABLET | Freq: Once | ORAL | Status: AC
  Administered 2014-04-20: 650 mg via ORAL

## 2014-04-20 NOTE — Patient Instructions (Signed)
[facility] Discharge Instructions for Patients Receiving Chemotherapy  Today you received the following chemotherapy agents:  Elotuzumab  To help prevent nausea and vomiting after your treatment, we encourage you to take your nausea medication as ordered per MD.   If you develop nausea and vomiting that is not controlled by your nausea medication, call the clinic.   BELOW ARE SYMPTOMS THAT SHOULD BE REPORTED IMMEDIATELY:  *FEVER GREATER THAN 100.5 F  *CHILLS WITH OR WITHOUT FEVER  NAUSEA AND VOMITING THAT IS NOT CONTROLLED WITH YOUR NAUSEA MEDICATION  *UNUSUAL SHORTNESS OF BREATH  *UNUSUAL BRUISING OR BLEEDING  TENDERNESS IN MOUTH AND THROAT WITH OR WITHOUT PRESENCE OF ULCERS  *URINARY PROBLEMS  *BOWEL PROBLEMS  UNUSUAL RASH Items with * indicate a potential emergency and should be followed up as soon as possible.  Feel free to call the clinic you have any questions or concerns. The clinic phone number is [phone].

## 2014-04-21 ENCOUNTER — Telehealth: Payer: Self-pay | Admitting: *Deleted

## 2014-04-21 ENCOUNTER — Other Ambulatory Visit: Payer: Self-pay | Admitting: *Deleted

## 2014-04-21 DIAGNOSIS — E876 Hypokalemia: Secondary | ICD-10-CM

## 2014-04-21 MED ORDER — POTASSIUM CHLORIDE CRYS ER 20 MEQ PO TBCR: 20.0000 meq | EXTENDED_RELEASE_TABLET | ORAL | Status: DC

## 2014-04-21 MED ORDER — OXYCODONE HCL 5 MG PO CAPS: 5.0000 mg | ORAL_CAPSULE | Freq: Four times a day (QID) | ORAL | Status: DC | PRN

## 2014-04-21 NOTE — Telephone Encounter (Signed)
K 3.1, left msg with [name] about taking 53meq kdur x 7 days.

## 2014-04-24 ENCOUNTER — Telehealth: Payer: Self-pay | Admitting: Internal Medicine

## 2014-04-24 ENCOUNTER — Other Ambulatory Visit: Payer: Medicare Other

## 2014-04-24 ENCOUNTER — Ambulatory Visit: Payer: Medicare Other

## 2014-04-24 NOTE — Telephone Encounter (Signed)
lvm for pt regarding all appts....advised pt to get new sched at nxt visit

## 2014-04-25 ENCOUNTER — Other Ambulatory Visit: Payer: Medicare Other

## 2014-04-25 ENCOUNTER — Ambulatory Visit: Payer: Medicare Other

## 2014-04-27 ENCOUNTER — Other Ambulatory Visit (HOSPITAL_BASED_OUTPATIENT_CLINIC_OR_DEPARTMENT_OTHER): Payer: Medicare Other

## 2014-04-27 ENCOUNTER — Ambulatory Visit (HOSPITAL_BASED_OUTPATIENT_CLINIC_OR_DEPARTMENT_OTHER): Payer: Medicare Other

## 2014-04-27 ENCOUNTER — Ambulatory Visit: Payer: Medicare Other | Admitting: Pharmacist

## 2014-04-27 ENCOUNTER — Telehealth: Payer: Self-pay | Admitting: Internal Medicine

## 2014-04-27 VITALS — BP 139/53 | HR 64 | Temp 98.1°F | Resp 17

## 2014-04-27 DIAGNOSIS — Z5112 Encounter for antineoplastic immunotherapy: Secondary | ICD-10-CM

## 2014-04-27 DIAGNOSIS — C9002 Multiple myeloma in relapse: Secondary | ICD-10-CM

## 2014-04-27 DIAGNOSIS — Z7901 Long term (current) use of anticoagulants: Secondary | ICD-10-CM

## 2014-04-27 DIAGNOSIS — C9000 Multiple myeloma not having achieved remission: Secondary | ICD-10-CM

## 2014-04-27 DIAGNOSIS — I82401 Acute embolism and thrombosis of unspecified deep veins of right lower extremity: Secondary | ICD-10-CM

## 2014-04-27 DIAGNOSIS — I82409 Acute embolism and thrombosis of unspecified deep veins of unspecified lower extremity: Secondary | ICD-10-CM

## 2014-04-27 LAB — POCT INR: INR: 2.6

## 2014-04-27 LAB — CBC WITH DIFFERENTIAL/PLATELET
BASO%: 0.1 % (ref 0.0–2.0)
BASOS ABS: 0 10*3/uL (ref 0.0–0.1)
EOS ABS: 0.1 10*3/uL (ref 0.0–0.5)
EOS%: 1.1 % (ref 0.0–7.0)
HEMATOCRIT: 34.7 % — AB (ref 34.8–46.6)
HGB: 10.8 g/dL — ABNORMAL LOW (ref 11.6–15.9)
LYMPH%: 4.1 % — ABNORMAL LOW (ref 14.0–49.7)
MCH: 29.6 pg (ref 25.1–34.0)
MCHC: 31.2 g/dL — ABNORMAL LOW (ref 31.5–36.0)
MCV: 94.9 fL (ref 79.5–101.0)
MONO#: 0.1 10*3/uL (ref 0.1–0.9)
MONO%: 2.4 % (ref 0.0–14.0)
NEUT#: 5.4 10*3/uL (ref 1.5–6.5)
NEUT%: 92.3 % — ABNORMAL HIGH (ref 38.4–76.8)
Platelets: 213 10*3/uL (ref 145–400)
RBC: 3.65 10*6/uL — ABNORMAL LOW (ref 3.70–5.45)
RDW: 17 % — ABNORMAL HIGH (ref 11.2–14.5)
WBC: 5.8 10*3/uL (ref 3.9–10.3)
lymph#: 0.2 10*3/uL — ABNORMAL LOW (ref 0.9–3.3)

## 2014-04-27 LAB — COMPREHENSIVE METABOLIC PANEL (CC13)
ALT: 11 U/L (ref 0–55)
AST: 11 U/L (ref 5–34)
Albumin: 3.2 g/dL — ABNORMAL LOW (ref 3.5–5.0)
Alkaline Phosphatase: 54 U/L (ref 40–150)
Anion Gap: 13 mEq/L — ABNORMAL HIGH (ref 3–11)
BILIRUBIN TOTAL: 0.44 mg/dL (ref 0.20–1.20)
BUN: 9.9 mg/dL (ref 7.0–26.0)
CALCIUM: 9.7 mg/dL (ref 8.4–10.4)
CHLORIDE: 105 meq/L (ref 98–109)
CO2: 25 mEq/L (ref 22–29)
Creatinine: 1 mg/dL (ref 0.6–1.1)
Glucose: 252 mg/dl — ABNORMAL HIGH (ref 70–140)
Potassium: 3.5 mEq/L (ref 3.5–5.1)
Sodium: 143 mEq/L (ref 136–145)
Total Protein: 6.2 g/dL — ABNORMAL LOW (ref 6.4–8.3)

## 2014-04-27 LAB — PROTIME-INR
INR: 2.6 (ref 2.00–3.50)
Protime: 31.2 Seconds — ABNORMAL HIGH (ref 10.6–13.4)

## 2014-04-27 MED ORDER — SODIUM CHLORIDE 0.9 % IV SOLN
Freq: Once | INTRAVENOUS | Status: AC
  Administered 2014-04-27: 11:00:00 via INTRAVENOUS

## 2014-04-27 MED ORDER — FAMOTIDINE IN NACL 20-0.9 MG/50ML-% IV SOLN
INTRAVENOUS | Status: AC
  Filled 2014-04-27: qty 50

## 2014-04-27 MED ORDER — FAMOTIDINE IN NACL 20-0.9 MG/50ML-% IV SOLN
20.0000 mg | Freq: Once | INTRAVENOUS | Status: AC
  Administered 2014-04-27: 20 mg via INTRAVENOUS

## 2014-04-27 MED ORDER — ACETAMINOPHEN 325 MG PO TABS
ORAL_TABLET | ORAL | Status: AC
  Filled 2014-04-27: qty 2

## 2014-04-27 MED ORDER — DEXAMETHASONE SODIUM PHOSPHATE 10 MG/ML IJ SOLN
INTRAMUSCULAR | Status: AC
  Filled 2014-04-27: qty 1

## 2014-04-27 MED ORDER — ACETAMINOPHEN 325 MG PO TABS
650.0000 mg | ORAL_TABLET | Freq: Once | ORAL | Status: AC
  Administered 2014-04-27: 650 mg via ORAL

## 2014-04-27 MED ORDER — DEXAMETHASONE SODIUM PHOSPHATE 10 MG/ML IJ SOLN
8.0000 mg | Freq: Once | INTRAMUSCULAR | Status: AC
  Administered 2014-04-27: 8 mg via INTRAVENOUS

## 2014-04-27 MED ORDER — SODIUM CHLORIDE 0.9 % IV SOLN
10.0000 mg/kg | Freq: Once | INTRAVENOUS | Status: AC
  Administered 2014-04-27: 565 mg via INTRAVENOUS
  Filled 2014-04-27: qty 22.6

## 2014-04-27 MED ORDER — DIPHENHYDRAMINE HCL 25 MG PO CAPS
ORAL_CAPSULE | ORAL | Status: AC
  Filled 2014-04-27: qty 2

## 2014-04-27 MED ORDER — DIPHENHYDRAMINE HCL 25 MG PO CAPS
50.0000 mg | ORAL_CAPSULE | Freq: Once | ORAL | Status: AC
  Administered 2014-04-27: 50 mg via ORAL

## 2014-04-27 NOTE — Patient Instructions (Signed)
[facility] Discharge Instructions for Patients Receiving Chemotherapy  Today you received the following chemotherapy agents: Elotuzumab  To help prevent nausea and vomiting after your treatment, we encourage you to take your nausea medication as prescribed by your physician.    If you develop nausea and vomiting that is not controlled by your nausea medication, call the clinic.   BELOW ARE SYMPTOMS THAT SHOULD BE REPORTED IMMEDIATELY:  *FEVER GREATER THAN 100.5 F  *CHILLS WITH OR WITHOUT FEVER  NAUSEA AND VOMITING THAT IS NOT CONTROLLED WITH YOUR NAUSEA MEDICATION  *UNUSUAL SHORTNESS OF BREATH  *UNUSUAL BRUISING OR BLEEDING  TENDERNESS IN MOUTH AND THROAT WITH OR WITHOUT PRESENCE OF ULCERS  *URINARY PROBLEMS  *BOWEL PROBLEMS  UNUSUAL RASH Items with * indicate a potential emergency and should be followed up as soon as possible.  Feel free to call the clinic you have any questions or concerns. The clinic phone number is [phone].

## 2014-04-27 NOTE — Telephone Encounter (Signed)
adjusted pt appts to later time....pt ok adn aware....gv adn printed pt new sched

## 2014-04-27 NOTE — Telephone Encounter (Signed)
Encounter was telephone call. 

## 2014-04-27 NOTE — Progress Notes (Signed)
INR = 2.6 on Coumadin 2.5 mg daily except 5 mg on Mon/Wed. Pt seen alone in treatment room today.  She is on elotuzumab cycle 1 day 15 today.  She appears to tolerate it well. Pt reports pain in her L side down her leg. No bleeding reported. INR therapeutic.  No change to Coumadin dose necessary. Repeat protime in 4 weeks. Kennith Center, Pharm.D., CPP 04/27/2014@11 :38 AM

## 2014-05-02 ENCOUNTER — Other Ambulatory Visit: Payer: Self-pay | Admitting: *Deleted

## 2014-05-02 NOTE — Telephone Encounter (Signed)
THIS REFILL REQUEST FOR REVLIMID WAS GIVEN TO DR.MOHAMED'S NURSE, DIANE BELL,RN.

## 2014-05-03 ENCOUNTER — Other Ambulatory Visit: Payer: Self-pay | Admitting: Medical Oncology

## 2014-05-03 DIAGNOSIS — C9000 Multiple myeloma not having achieved remission: Secondary | ICD-10-CM

## 2014-05-03 MED ORDER — LENALIDOMIDE 25 MG PO CAPS: 25.0000 mg | ORAL_CAPSULE | Freq: Every day | ORAL | Status: DC

## 2014-05-03 NOTE — Telephone Encounter (Signed)
[facility]ill [facility]ed to [facility]

## 2014-05-04 ENCOUNTER — Other Ambulatory Visit (HOSPITAL_BASED_OUTPATIENT_CLINIC_OR_DEPARTMENT_OTHER): Payer: Medicare Other

## 2014-05-04 ENCOUNTER — Encounter: Payer: Self-pay | Admitting: Physician Assistant

## 2014-05-04 ENCOUNTER — Ambulatory Visit (HOSPITAL_BASED_OUTPATIENT_CLINIC_OR_DEPARTMENT_OTHER): Payer: Medicare Other | Admitting: Physician Assistant

## 2014-05-04 ENCOUNTER — Telehealth: Payer: Self-pay | Admitting: Internal Medicine

## 2014-05-04 ENCOUNTER — Ambulatory Visit: Payer: Medicare Other

## 2014-05-04 ENCOUNTER — Ambulatory Visit (HOSPITAL_BASED_OUTPATIENT_CLINIC_OR_DEPARTMENT_OTHER): Payer: Medicare Other

## 2014-05-04 VITALS — BP 136/62 | HR 59 | Temp 97.8°F | Resp 18 | Ht <= 58 in | Wt 123.9 lb

## 2014-05-04 DIAGNOSIS — I82409 Acute embolism and thrombosis of unspecified deep veins of unspecified lower extremity: Secondary | ICD-10-CM

## 2014-05-04 DIAGNOSIS — Z23 Encounter for immunization: Secondary | ICD-10-CM

## 2014-05-04 DIAGNOSIS — Z5112 Encounter for antineoplastic immunotherapy: Secondary | ICD-10-CM

## 2014-05-04 DIAGNOSIS — C9000 Multiple myeloma not having achieved remission: Secondary | ICD-10-CM

## 2014-05-04 DIAGNOSIS — Z7901 Long term (current) use of anticoagulants: Secondary | ICD-10-CM

## 2014-05-04 DIAGNOSIS — C9002 Multiple myeloma in relapse: Secondary | ICD-10-CM

## 2014-05-04 LAB — CBC WITH DIFFERENTIAL/PLATELET
BASO%: 0.4 % (ref 0.0–2.0)
Basophils Absolute: 0 10*3/uL (ref 0.0–0.1)
EOS%: 1.2 % (ref 0.0–7.0)
Eosinophils Absolute: 0.1 10*3/uL (ref 0.0–0.5)
HEMATOCRIT: 35.6 % (ref 34.8–46.6)
HEMOGLOBIN: 11.3 g/dL — AB (ref 11.6–15.9)
LYMPH%: 5.4 % — AB (ref 14.0–49.7)
MCH: 30.1 pg (ref 25.1–34.0)
MCHC: 31.7 g/dL (ref 31.5–36.0)
MCV: 95 fL (ref 79.5–101.0)
MONO#: 0.2 10*3/uL (ref 0.1–0.9)
MONO%: 4.1 % (ref 0.0–14.0)
NEUT%: 88.9 % — ABNORMAL HIGH (ref 38.4–76.8)
NEUTROS ABS: 3.9 10*3/uL (ref 1.5–6.5)
Platelets: 207 10*3/uL (ref 145–400)
RBC: 3.75 10*6/uL (ref 3.70–5.45)
RDW: 17.5 % — ABNORMAL HIGH (ref 11.2–14.5)
WBC: 4.4 10*3/uL (ref 3.9–10.3)
lymph#: 0.2 10*3/uL — ABNORMAL LOW (ref 0.9–3.3)

## 2014-05-04 LAB — COMPREHENSIVE METABOLIC PANEL (CC13)
ALT: 13 U/L (ref 0–55)
AST: 14 U/L (ref 5–34)
Albumin: 3.3 g/dL — ABNORMAL LOW (ref 3.5–5.0)
Alkaline Phosphatase: 57 U/L (ref 40–150)
Anion Gap: 10 mEq/L (ref 3–11)
BILIRUBIN TOTAL: 0.26 mg/dL (ref 0.20–1.20)
BUN: 11.6 mg/dL (ref 7.0–26.0)
CO2: 23 mEq/L (ref 22–29)
Calcium: 9.8 mg/dL (ref 8.4–10.4)
Chloride: 107 mEq/L (ref 98–109)
Creatinine: 0.9 mg/dL (ref 0.6–1.1)
GLUCOSE: 146 mg/dL — AB (ref 70–140)
Potassium: 3.6 mEq/L (ref 3.5–5.1)
Sodium: 141 mEq/L (ref 136–145)
Total Protein: 6.3 g/dL — ABNORMAL LOW (ref 6.4–8.3)

## 2014-05-04 LAB — PROTIME-INR
INR: 3.4 (ref 2.00–3.50)
Protime: 40.8 Seconds — ABNORMAL HIGH (ref 10.6–13.4)

## 2014-05-04 MED ORDER — ACETAMINOPHEN 325 MG PO TABS
650.0000 mg | ORAL_TABLET | Freq: Once | ORAL | Status: AC
  Administered 2014-05-04: 650 mg via ORAL

## 2014-05-04 MED ORDER — INFLUENZA VAC SPLIT QUAD 0.5 ML IM SUSY
0.5000 mL | PREFILLED_SYRINGE | Freq: Once | INTRAMUSCULAR | Status: AC
  Administered 2014-05-04: 0.5 mL via INTRAMUSCULAR
  Filled 2014-05-04: qty 0.5

## 2014-05-04 MED ORDER — FAMOTIDINE IN NACL 20-0.9 MG/50ML-% IV SOLN
20.0000 mg | Freq: Once | INTRAVENOUS | Status: AC
  Administered 2014-05-04: 20 mg via INTRAVENOUS

## 2014-05-04 MED ORDER — FAMOTIDINE IN NACL 20-0.9 MG/50ML-% IV SOLN
INTRAVENOUS | Status: AC
  Filled 2014-05-04: qty 50

## 2014-05-04 MED ORDER — DEXAMETHASONE SODIUM PHOSPHATE 10 MG/ML IJ SOLN
8.0000 mg | Freq: Once | INTRAMUSCULAR | Status: AC
  Administered 2014-05-04: 8 mg via INTRAVENOUS

## 2014-05-04 MED ORDER — SODIUM CHLORIDE 0.9 % IV SOLN
Freq: Once | INTRAVENOUS | Status: AC
  Administered 2014-05-04: 12:00:00 via INTRAVENOUS

## 2014-05-04 MED ORDER — DIPHENHYDRAMINE HCL 25 MG PO CAPS
ORAL_CAPSULE | ORAL | Status: AC
  Filled 2014-05-04: qty 2

## 2014-05-04 MED ORDER — SODIUM CHLORIDE 0.9 % IV SOLN
10.0000 mg/kg | Freq: Once | INTRAVENOUS | Status: AC
  Administered 2014-05-04: 565 mg via INTRAVENOUS
  Filled 2014-05-04: qty 22.6

## 2014-05-04 MED ORDER — ACYCLOVIR 400 MG PO TABS: ORAL_TABLET | ORAL | Status: DC

## 2014-05-04 MED ORDER — DEXAMETHASONE SODIUM PHOSPHATE 10 MG/ML IJ SOLN
INTRAMUSCULAR | Status: AC
  Filled 2014-05-04: qty 1

## 2014-05-04 MED ORDER — ACETAMINOPHEN 325 MG PO TABS
ORAL_TABLET | ORAL | Status: AC
  Filled 2014-05-04: qty 2

## 2014-05-04 MED ORDER — DIPHENHYDRAMINE HCL 25 MG PO CAPS
50.0000 mg | ORAL_CAPSULE | Freq: Once | ORAL | Status: AC
  Administered 2014-05-04: 50 mg via ORAL

## 2014-05-04 NOTE — Patient Instructions (Signed)
[facility] Discharge Instructions for Patients Receiving Chemotherapy  Today you received the following chemotherapy agents: Elotuzumab You also received your flu vaccine today.  To help prevent nausea and vomiting after your treatment, we encourage you to take your nausea medication as prescribed by your physician.    If you develop nausea and vomiting that is not controlled by your nausea medication, call the clinic.   BELOW ARE SYMPTOMS THAT SHOULD BE REPORTED IMMEDIATELY:  *FEVER GREATER THAN 100.5 F  *CHILLS WITH OR WITHOUT FEVER  NAUSEA AND VOMITING THAT IS NOT CONTROLLED WITH YOUR NAUSEA MEDICATION  *UNUSUAL SHORTNESS OF BREATH  *UNUSUAL BRUISING OR BLEEDING  TENDERNESS IN MOUTH AND THROAT WITH OR WITHOUT PRESENCE OF ULCERS  *URINARY PROBLEMS  *BOWEL PROBLEMS  UNUSUAL RASH Items with * indicate a potential emergency and should be followed up as soon as possible.  Feel free to call the clinic you have any questions or concerns. The clinic phone number is [phone].

## 2014-05-04 NOTE — Progress Notes (Addendum)
[facility] [phone]   [phone]  SHARED VISIT PROGRESS NOTE  Lindsay Stain, MD [place] [address]  DIAGNOSIS:  1. multiple myeloma diagnosed in September 2009,  2. history of bilateral lower extremity deep vein thrombosis diagnosed in November 2009  3. acute on chronic deep vein thrombosis in the left lower extremity diagnosed in October 2010.   PRIOR THERAPY:  1. status post palliative radiotherapy to the right femur and is she'll tuberosity, first was done in December 2011 and the second treatment was completed Jan 08 2011, and the third treatment to the right distal femur completed on 04/30/2011  2. status post palliative radiotherapy to the left proximal femur completed 02/28/2011.  3. Revlimid 25 mg by mouth daily for 21 days every 4 weeks in addition to oral Decadron at 40 mg by mouth on a weekly basis status post 40 cycles.  4. weekly subcutaneous Velcade 1.3 mg/M2 status post 60 cycles, discontinued today secondary to mild disease progression. 5. Pomalyst 4 mg by mouth daily for 21 days every 4 weeks, the patient started the first dose on 12/22/2012 , in addition to Decadron 40 mg weekly. Status post 13 cycles.   CURRENT THERAPY:  1.   Chemotherapy according to the [facility] [id number] expanded access treatment protocol with Elotuzumab lenalidomide and dexamthasone. 2. Coumadin 5 mg on Mondays and Thursdays and 2.5 mg on all other days. 3.  Zometa 4 mg IV given every 3 months.    INTERVAL HISTORY: [name] 78 y.o. female returns to the clinic today for followup visit accompanied by her daughter. She is currently completing her first cycle of treatment with elotuzumab, lenalidomide and dexamethasone. Overall she's tolerating this treatment without difficulty. She does request a refill for her acyclovir 400 mg by mouth twice daily. She does report occasional episodes of diarrhea. She is taking her dexamethasone and  Revlimid as prescribed.  She is feeling fine today except for her baseline level of fatigue and weakness. She continues to have the low back pain with radiation to the lower extremity and she is currently on pain medication oxycodone 4 times a day. She denied having any significant nausea or vomiting. The patient denied having any significant weight loss or night sweats. She has no chest pain, shortness of breath, cough or hemoptysis.   MEDICAL HISTORY: Past Medical History  Diagnosis Date  . Blood transfusion   . Depression     Mild  . History of chicken pox   . Hyperlipidemia   . Hypertension   . Degenerative joint disease   . Phlebitis   . Multiple myeloma     per Dr. Julien Nordmann, s/p palliative radiation for leg pain 2011 per Dr. Sondra Come  . Deep vein thrombosis of bilateral lower extremities   . Family history of anesthesia complication     DAUGHTER CPR AFTER  . Cardiomyopathy   . Diabetes mellitus     Steroid related  . UTI (urinary tract infection) 09/08/2013  . History of radiation therapy 08/30/13-09/20/13    20 gray to lower lumbar/upper sacrum    ALLERGIES:  has No Known Allergies.  MEDICATIONS:  Current Outpatient Prescriptions  Medication Sig Dispense Refill  . acyclovir (ZOVIRAX) 400 MG tablet TAKE ONE TABLET BY MOUTH TWICE DAILY  60 tablet  0  . ALPRAZolam (XANAX) 0.5 MG tablet Take 1 tablet (0.5 mg total) by mouth 2 (two) times daily as needed for anxiety.  60 tablet  0  . Calcium Carbonate-Vitamin D 600-400 MG-UNIT per tablet Take 1 tablet by mouth 3 (three) times daily with meals.        Marland Kitchen dexamethasone (DECADRON) 4 MG tablet Take 1 tablet (4 mg total) by mouth once a week.  100 tablet  0  . dextromethorphan-guaiFENesin (MUCINEX DM) 30-600 MG per 12 hr tablet Take 1 tablet by mouth 2 (two) times daily.      Marland Kitchen glipiZIDE (GLUCOTROL XL) 2.5 MG 24 hr tablet Take 2.5 mg by mouth daily with breakfast.      . glucose blood (ONE TOUCH ULTRA TEST) test strip Use as instructed   25 each  6  . Lancets (ONETOUCH ULTRASOFT) lancets Test blood sugar once daily or as directed.  100 each  11  . lenalidomide (REVLIMID) 25 MG capsule Take 1 capsule (25 mg total) by mouth daily. 25mg  daily, repeat every 28 days.  auth # [id number] 05/03/14.  Female--not of childbearing potential.  21 capsule  0  . lisinopril-hydrochlorothiazide (PRINZIDE,ZESTORETIC) 20-12.5 MG per tablet Take 1/2 by mouth daily      . loperamide (IMODIUM A-D) 2 MG tablet Take 1 tablet (2 mg total) by mouth 4 (four) times daily as needed for diarrhea or loose stools.      Marland Kitchen oxycodone (OXY-IR) 5 MG capsule Take 1 capsule (5 mg total) by mouth every 6 (six) hours as needed for pain (1 1/2 tablets every 6 hours prn pain).  90 capsule  0  . potassium chloride SA (K-DUR,KLOR-CON) 20 MEQ tablet Take 1 tablet (20 mEq total) by mouth as directed. 37meq daily x 7 days  7 tablet  0  . prochlorperazine (COMPAZINE) 10 MG tablet       . temazepam (RESTORIL) 30 MG capsule Take 1 capsule (30 mg total) by mouth at bedtime as needed for sleep.  3 capsule  0  . warfarin (COUMADIN) 5 MG tablet Take 2.5-5 mg by mouth daily. Takes 1(5mg ) tablet on mondays, Wednesday. Takes 1/2(2.5mg ) tablet on Tuesday,thursday, Friday, Saturday and sunday      . OxyCODONE (OXYCONTIN) 10 mg T12A 12 hr tablet Take 1 tablet (10 mg total) by mouth every 12 (twelve) hours.  60 tablet  0  . predniSONE (DELTASONE) 20 MG tablet Take 20 mg by mouth once a week.       No current facility-administered medications for this visit.   Facility-Administered Medications Ordered in Other Visits  Medication Dose Route Frequency Provider Last Rate Last Dose  . Zoledronic Acid (ZOMETA) 4 mg IVPB  4 mg Intravenous Once Curt Bears, MD        SURGICAL HISTORY:  Past Surgical History  Procedure Laterality Date  . Abdominal hysterectomy    . Ankle fracture surgery Left     REVIEW OF SYSTEMS:  Constitutional: positive for fatigue Eyes: negative Ears, nose, mouth,  throat, and face: positive for Soft tissue mass left lower mandible, likely a sebaceous cyst per CT on 03/01/2014 Respiratory: negative Cardiovascular: negative Gastrointestinal: negative Genitourinary:negative Integument/breast: negative Hematologic/lymphatic: negative Musculoskeletal:positive for back pain Neurological: positive for paresthesia Behavioral/Psych: negative Endocrine: negative Allergic/Immunologic: negative   PHYSICAL EXAMINATION: General appearance: alert, cooperative, fatigued and no distress Head: Normocephalic, without obvious abnormality, atraumatic, Subcutaneous nodule on the left side of the face. Neck: no adenopathy, no JVD, supple, symmetrical, trachea midline and thyroid not enlarged, symmetric, no tenderness/mass/nodules Lymph nodes: Cervical, supraclavicular, and axillary nodes normal. Resp: clear to auscultation bilaterally Back: symmetric, no curvature. ROM normal. No  CVA tenderness. Cardio: regular rate and rhythm, S1, S2 normal, no murmur, click, rub or gallop GI: soft, non-tender; bowel sounds normal; no masses,  no organomegaly Extremities: extremities normal, atraumatic, no cyanosis or edema Neurologic: Alert and oriented X 3, normal strength and tone. Normal symmetric reflexes. Normal coordination and gait  ECOG PERFORMANCE STATUS: 2 - Symptomatic, <50% confined to bed  Blood pressure 136/62, pulse 59, temperature 97.8 F (36.6 C), temperature source Oral, resp. rate 18, height $RemoveBe'4\' 10"'HdflbYAJZ$  (1.473 m), weight 123 lb 14.4 oz (56.201 kg), SpO2 100.00%.  LABORATORY DATA: Lab Results  Component Value Date   WBC 4.4 05/04/2014   HGB 11.3* 05/04/2014   HCT 35.6 05/04/2014   MCV 95.0 05/04/2014   PLT 207 05/04/2014      Chemistry      Component Value Date/Time   NA 141 05/04/2014 0932   NA 146 03/12/2014 0415   K 3.6 05/04/2014 0932   K 3.6* 03/12/2014 0415   CL 107 03/12/2014 0415   CL 107 02/14/2013 1311   CO2 23 05/04/2014 0932   CO2 29 03/12/2014 0415   BUN 11.6  05/04/2014 0932   BUN 27* 03/12/2014 0415   CREATININE 0.9 05/04/2014 0932   CREATININE 1.00 03/12/2014 0415      Component Value Date/Time   CALCIUM 9.8 05/04/2014 0932   CALCIUM 9.9 03/12/2014 0415   CALCIUM  Value: 5.7 CRITICAL RESULT CALLED TO, READ BACK BY AND VERIFIED WITH: [facility] [facility] [id number] @ [address] [place] (NOTE)  Amended report. Result repeated and verified. CORRECTED ON 10/14 AT 1429: PREVIOUSLY REPORTED AS Result repeated and verified.* 06/13/2008 1605   ALKPHOS 57 05/04/2014 0932   ALKPHOS 51 03/09/2014 2349   AST 14 05/04/2014 0932   AST 14 03/09/2014 2349   ALT 13 05/04/2014 0932   ALT 13 03/09/2014 2349   BILITOT 0.26 05/04/2014 0932   BILITOT 0.3 03/09/2014 2349     Other lab results: Beta-2 microglobulin 3.93, free kappa light chain 0.79, free lambda light chain 60.60 and kappa/lambda ratio 0.01. IgG 527, IgA 103 and IgM 200.  RADIOGRAPHIC STUDIES: No results found.  ASSESSMENT AND PLAN: This is a very pleasant 78 years old African American female with relapsed multiple myeloma currently treatment with Pomalyst 4 mg by mouth daily for 21 days every 4 weeks status post 13 cycles. The patient is tolerating her treatment fairly well.  She was supposed to start cycle #13 in early June of 2015  Her recent myeloma panel showed evidence for disease progression.  Treatment with Pomalyst was discontinued. She is currently enrollmed in a [name] clinical trial with treatment with Elotuzumab, Revlimid and Decadron. She is completing her first cycle and overall is tolerating this treatment without difficulty. The patient was discussed with and also seen by Dr. Julien Nordmann. She will continue with labs and chemotherapy as scheduled. She'll followup in one month for another symptom management visit. We'll plan to repeat protein studies/myeloma panel to reevaluate her disease after she has completed 3 cycles. A prescription for acyclovir 400 mg by mouth twice daily, one month supply was sent to her pharmacy  of record via E. scribe. For pain management, she would continue on oxycodone on as-needed basis. The patient was advised to call immediately if she has any concerning symptoms in the interval. The patient voices understanding of current disease status and treatment options and is in agreement with the current care plan.  All questions were answered. The patient knows to call  the clinic with any problems, questions or concerns. We can certainly see the patient much sooner if necessary.  MULTIPLE  MYELOMA Patient is completing her first cycle according to the [id number] expanded access treatment protocol with Elotuzumab lenalidomide and dexamthasone. she is tolerating this treatment without difficulty. A refill for her next cycle her was sent to her pharmacy of record via E. scribe. She will continue with labs and chemotherapy as scheduled. She will return in one month for another symptom management visit. We will plan to repeat protein studies after she has completed cycle #3.   Carlton Adam PA-C  ADDENDUM: Hematology/Oncology Attending: I had the face to face encounter with the patient. I recommended her care plan. This is a very pleasant 78 years old [place] American female with multiple myeloma diagnosed in September of 2009 status post several chemotherapy regimens and recently she is treated on a clinical trial with Elotuzumab, Revlimid and Decadron status post 1 cycle and tolerating her treatment well. I recommended for the patient to proceed with cycle #2 today as scheduled. She would come back for follow up visit in one month for reevaluation and close monitoring of her condition. She was advised to call immediately if she has any concerning symptoms in the interval.  Disclaimer: This note was dictated with voice recognition software. Similar sounding words can inadvertently be transcribed and may not be corrected upon review. Eilleen Kempf., MD 05/06/2014

## 2014-05-04 NOTE — Telephone Encounter (Signed)
gv and pirnted appt sched and avs for pt for Sept and OCT...sed added tx

## 2014-05-05 NOTE — Telephone Encounter (Signed)
rx has been shipped.

## 2014-05-05 NOTE — Patient Instructions (Signed)
Continue with labs and chemotherapy as scheduled Followup in one month

## 2014-05-05 NOTE — Progress Notes (Signed)
No notes in this encounter. See 8/13, cycle 1

## 2014-05-05 NOTE — Assessment & Plan Note (Addendum)
Patient is completing her first cycle according to the [id number] expanded access treatment protocol with Elotuzumab lenalidomide and dexamthasone. she is tolerating this treatment without difficulty. A refill for her next cycle her was sent to her pharmacy of record via E. scribe. She will continue with labs and chemotherapy as scheduled. She will return in one month for another symptom management visit. We will plan to repeat protein studies after she has completed cycle #3.

## 2014-05-09 ENCOUNTER — Other Ambulatory Visit: Payer: Self-pay | Admitting: Medical Oncology

## 2014-05-09 ENCOUNTER — Other Ambulatory Visit: Payer: Self-pay | Admitting: Internal Medicine

## 2014-05-09 DIAGNOSIS — C9000 Multiple myeloma not having achieved remission: Secondary | ICD-10-CM

## 2014-05-09 MED ORDER — OXYCODONE HCL 5 MG PO CAPS: 5.0000 mg | ORAL_CAPSULE | Freq: Four times a day (QID) | ORAL | Status: DC | PRN

## 2014-05-09 NOTE — Telephone Encounter (Signed)
Rx locked in injection room.

## 2014-05-11 ENCOUNTER — Encounter: Payer: Self-pay | Admitting: *Deleted

## 2014-05-11 ENCOUNTER — Ambulatory Visit (HOSPITAL_BASED_OUTPATIENT_CLINIC_OR_DEPARTMENT_OTHER): Payer: Medicare Other

## 2014-05-11 ENCOUNTER — Other Ambulatory Visit (HOSPITAL_BASED_OUTPATIENT_CLINIC_OR_DEPARTMENT_OTHER): Payer: Medicare Other

## 2014-05-11 ENCOUNTER — Ambulatory Visit: Payer: Medicare Other

## 2014-05-11 ENCOUNTER — Other Ambulatory Visit: Payer: Medicare Other

## 2014-05-11 VITALS — BP 108/66 | HR 69 | Temp 98.4°F | Resp 18 | Wt 122.5 lb

## 2014-05-11 DIAGNOSIS — C9000 Multiple myeloma not having achieved remission: Secondary | ICD-10-CM

## 2014-05-11 DIAGNOSIS — Z5112 Encounter for antineoplastic immunotherapy: Secondary | ICD-10-CM

## 2014-05-11 LAB — CBC WITH DIFFERENTIAL/PLATELET
BASO%: 0.2 % (ref 0.0–2.0)
BASOS ABS: 0 10*3/uL (ref 0.0–0.1)
EOS ABS: 0 10*3/uL (ref 0.0–0.5)
EOS%: 0 % (ref 0.0–7.0)
HCT: 36.5 % (ref 34.8–46.6)
HEMOGLOBIN: 11.6 g/dL (ref 11.6–15.9)
LYMPH%: 4.5 % — ABNORMAL LOW (ref 14.0–49.7)
MCH: 30 pg (ref 25.1–34.0)
MCHC: 31.8 g/dL (ref 31.5–36.0)
MCV: 94.3 fL (ref 79.5–101.0)
MONO#: 0.1 10*3/uL (ref 0.1–0.9)
MONO%: 1.4 % (ref 0.0–14.0)
NEUT%: 93.9 % — ABNORMAL HIGH (ref 38.4–76.8)
NEUTROS ABS: 4 10*3/uL (ref 1.5–6.5)
PLATELETS: 282 10*3/uL (ref 145–400)
RBC: 3.87 10*6/uL (ref 3.70–5.45)
RDW: 16.2 % — ABNORMAL HIGH (ref 11.2–14.5)
WBC: 4.2 10*3/uL (ref 3.9–10.3)
lymph#: 0.2 10*3/uL — ABNORMAL LOW (ref 0.9–3.3)

## 2014-05-11 LAB — COMPREHENSIVE METABOLIC PANEL (CC13)
ALBUMIN: 3.3 g/dL — AB (ref 3.5–5.0)
ALT: 15 U/L (ref 0–55)
ANION GAP: 12 meq/L — AB (ref 3–11)
AST: 17 U/L (ref 5–34)
Alkaline Phosphatase: 58 U/L (ref 40–150)
BUN: 20.7 mg/dL (ref 7.0–26.0)
CALCIUM: 9.2 mg/dL (ref 8.4–10.4)
CHLORIDE: 104 meq/L (ref 98–109)
CO2: 22 meq/L (ref 22–29)
Creatinine: 1.2 mg/dL — ABNORMAL HIGH (ref 0.6–1.1)
GLUCOSE: 224 mg/dL — AB (ref 70–140)
Potassium: 3.5 mEq/L (ref 3.5–5.1)
SODIUM: 139 meq/L (ref 136–145)
TOTAL PROTEIN: 6.6 g/dL (ref 6.4–8.3)
Total Bilirubin: 0.29 mg/dL (ref 0.20–1.20)

## 2014-05-11 LAB — LACTATE DEHYDROGENASE (CC13): LDH: 281 U/L — ABNORMAL HIGH (ref 125–245)

## 2014-05-11 MED ORDER — FAMOTIDINE IN NACL 20-0.9 MG/50ML-% IV SOLN
20.0000 mg | Freq: Once | INTRAVENOUS | Status: AC
  Administered 2014-05-11: 20 mg via INTRAVENOUS

## 2014-05-11 MED ORDER — DIPHENHYDRAMINE HCL 25 MG PO CAPS
50.0000 mg | ORAL_CAPSULE | Freq: Once | ORAL | Status: AC
  Administered 2014-05-11: 50 mg via ORAL

## 2014-05-11 MED ORDER — ACETAMINOPHEN 325 MG PO TABS
ORAL_TABLET | ORAL | Status: AC
  Filled 2014-05-11: qty 2

## 2014-05-11 MED ORDER — DEXAMETHASONE SODIUM PHOSPHATE 10 MG/ML IJ SOLN
INTRAMUSCULAR | Status: AC
  Filled 2014-05-11: qty 1

## 2014-05-11 MED ORDER — SODIUM CHLORIDE 0.9 % IV SOLN
10.0000 mg/kg | Freq: Once | INTRAVENOUS | Status: AC
  Administered 2014-05-11: 555 mg via INTRAVENOUS
  Filled 2014-05-11: qty 22.2

## 2014-05-11 MED ORDER — DIPHENHYDRAMINE HCL 25 MG PO CAPS
ORAL_CAPSULE | ORAL | Status: AC
  Filled 2014-05-11: qty 1

## 2014-05-11 MED ORDER — DIPHENHYDRAMINE HCL 25 MG PO CAPS
ORAL_CAPSULE | ORAL | Status: AC
  Filled 2014-05-11: qty 2

## 2014-05-11 MED ORDER — ACETAMINOPHEN 325 MG PO TABS
650.0000 mg | ORAL_TABLET | Freq: Once | ORAL | Status: AC
  Administered 2014-05-11: 650 mg via ORAL

## 2014-05-11 MED ORDER — SODIUM CHLORIDE 0.9 % IV SOLN
Freq: Once | INTRAVENOUS | Status: AC
  Administered 2014-05-11: 14:00:00 via INTRAVENOUS

## 2014-05-11 MED ORDER — FAMOTIDINE IN NACL 20-0.9 MG/50ML-% IV SOLN
INTRAVENOUS | Status: AC
  Filled 2014-05-11: qty 50

## 2014-05-11 MED ORDER — DEXAMETHASONE SODIUM PHOSPHATE 10 MG/ML IJ SOLN
8.0000 mg | Freq: Once | INTRAMUSCULAR | Status: AC
  Administered 2014-05-11: 8 mg via INTRAVENOUS

## 2014-05-11 NOTE — Progress Notes (Signed)
Per Dr. Julien Nordmann and [facility] research RN, no CMET needed to start elotuzumab.

## 2014-05-11 NOTE — Patient Instructions (Signed)
[facility] Discharge Instructions for Patients Receiving Chemotherapy  Today you received the following chemotherapy agents: Elotuzumab  To help prevent nausea and vomiting after your treatment, we encourage you to take your nausea medication as prescribed.    If you develop nausea and vomiting that is not controlled by your nausea medication, call the clinic.   BELOW ARE SYMPTOMS THAT SHOULD BE REPORTED IMMEDIATELY:  *FEVER GREATER THAN 100.5 F  *CHILLS WITH OR WITHOUT FEVER  NAUSEA AND VOMITING THAT IS NOT CONTROLLED WITH YOUR NAUSEA MEDICATION  *UNUSUAL SHORTNESS OF BREATH  *UNUSUAL BRUISING OR BLEEDING  TENDERNESS IN MOUTH AND THROAT WITH OR WITHOUT PRESENCE OF ULCERS  *URINARY PROBLEMS  *BOWEL PROBLEMS  UNUSUAL RASH Items with * indicate a potential emergency and should be followed up as soon as possible.  Feel free to call the clinic you have any questions or concerns. The clinic phone number is [phone].

## 2014-05-11 NOTE — Progress Notes (Signed)
05/11/2014 Cycle 2, day 1 [name] is in the cancer center today for cycle 2, day 2 treatment on the BMS [id number] study. She is accompanied her daughter, [name]. They both comfirm she took 28 mg decadron this morning and she will begin revlimid this afternoon.  Weight today is 55.6.Kg. Communicated to pharmacy and elotuzumzb dose adjusted using cycle 2, day 1 weight. Sign for infusion given to A. Robinson,RN. Reviewed the use of a filter, timing of pre-meds,and infusion rate.

## 2014-05-15 ENCOUNTER — Telehealth: Payer: Self-pay | Admitting: *Deleted

## 2014-05-15 NOTE — Telephone Encounter (Signed)
Message copied by Wardell Heath on Mon May 15, 2014 11:22 AM ------      Message from: Carlton Adam      Created: Mon May 15, 2014  9:20 AM       Abnormal results, please call and notify patient to push po fluids ------

## 2014-05-15 NOTE — Telephone Encounter (Signed)
Left message for daughter(Kim) to call back.

## 2014-05-18 ENCOUNTER — Other Ambulatory Visit (HOSPITAL_BASED_OUTPATIENT_CLINIC_OR_DEPARTMENT_OTHER): Payer: Medicare Other

## 2014-05-18 ENCOUNTER — Ambulatory Visit (HOSPITAL_BASED_OUTPATIENT_CLINIC_OR_DEPARTMENT_OTHER): Payer: Medicare Other

## 2014-05-18 ENCOUNTER — Other Ambulatory Visit: Payer: Self-pay | Admitting: Medical Oncology

## 2014-05-18 ENCOUNTER — Ambulatory Visit: Payer: Medicare Other

## 2014-05-18 ENCOUNTER — Encounter: Payer: Self-pay | Admitting: *Deleted

## 2014-05-18 ENCOUNTER — Other Ambulatory Visit: Payer: Self-pay | Admitting: Oncology

## 2014-05-18 ENCOUNTER — Other Ambulatory Visit: Payer: Medicare Other

## 2014-05-18 VITALS — BP 122/62 | HR 61 | Temp 98.2°F

## 2014-05-18 DIAGNOSIS — E876 Hypokalemia: Secondary | ICD-10-CM

## 2014-05-18 DIAGNOSIS — C9000 Multiple myeloma not having achieved remission: Secondary | ICD-10-CM

## 2014-05-18 DIAGNOSIS — Z5112 Encounter for antineoplastic immunotherapy: Secondary | ICD-10-CM

## 2014-05-18 DIAGNOSIS — C9002 Multiple myeloma in relapse: Secondary | ICD-10-CM

## 2014-05-18 LAB — CBC WITH DIFFERENTIAL/PLATELET
BASO%: 0.2 % (ref 0.0–2.0)
Basophils Absolute: 0 10*3/uL (ref 0.0–0.1)
EOS%: 1.9 % (ref 0.0–7.0)
Eosinophils Absolute: 0.1 10*3/uL (ref 0.0–0.5)
HEMATOCRIT: 37.4 % (ref 34.8–46.6)
HGB: 11.7 g/dL (ref 11.6–15.9)
LYMPH#: 0.3 10*3/uL — AB (ref 0.9–3.3)
LYMPH%: 5.3 % — ABNORMAL LOW (ref 14.0–49.7)
MCH: 29.5 pg (ref 25.1–34.0)
MCHC: 31.3 g/dL — AB (ref 31.5–36.0)
MCV: 94.4 fL (ref 79.5–101.0)
MONO#: 0.1 10*3/uL (ref 0.1–0.9)
MONO%: 1.7 % (ref 0.0–14.0)
NEUT#: 5.4 10*3/uL (ref 1.5–6.5)
NEUT%: 90.9 % — ABNORMAL HIGH (ref 38.4–76.8)
Platelets: 184 10*3/uL (ref 145–400)
RBC: 3.96 10*6/uL (ref 3.70–5.45)
RDW: 16 % — AB (ref 11.2–14.5)
WBC: 5.9 10*3/uL (ref 3.9–10.3)

## 2014-05-18 LAB — COMPREHENSIVE METABOLIC PANEL
ALBUMIN: 3.2 g/dL — AB (ref 3.5–5.2)
ALT: 17 U/L (ref 0–35)
AST: 20 U/L (ref 0–37)
Alkaline Phosphatase: 54 U/L (ref 39–117)
BUN: 14 mg/dL (ref 6–23)
CHLORIDE: 97 meq/L (ref 96–112)
CO2: 30 meq/L (ref 19–32)
Calcium: 10.7 mg/dL — ABNORMAL HIGH (ref 8.4–10.5)
Creatinine, Ser: 0.99 mg/dL (ref 0.50–1.10)
Glucose, Bld: 183 mg/dL — ABNORMAL HIGH (ref 70–99)
POTASSIUM: 3.3 meq/L — AB (ref 3.5–5.3)
SODIUM: 141 meq/L (ref 135–145)
Total Bilirubin: 0.4 mg/dL (ref 0.2–1.2)
Total Protein: 6.5 g/dL (ref 6.0–8.3)

## 2014-05-18 MED ORDER — DEXAMETHASONE SODIUM PHOSPHATE 10 MG/ML IJ SOLN
8.0000 mg | Freq: Once | INTRAMUSCULAR | Status: AC
  Administered 2014-05-18: 8 mg via INTRAVENOUS

## 2014-05-18 MED ORDER — DIPHENHYDRAMINE HCL 25 MG PO CAPS
50.0000 mg | ORAL_CAPSULE | Freq: Once | ORAL | Status: AC
  Administered 2014-05-18: 50 mg via ORAL

## 2014-05-18 MED ORDER — FAMOTIDINE IN NACL 20-0.9 MG/50ML-% IV SOLN
20.0000 mg | Freq: Once | INTRAVENOUS | Status: AC
Start: 2014-05-18 — End: 2014-05-18
  Administered 2014-05-18: 20 mg via INTRAVENOUS

## 2014-05-18 MED ORDER — INV-ELOTUZUMAB CHEMO INJECTION 400MG BMS CA204-143
10.0000 mg/kg | Freq: Once | INTRAMUSCULAR | Status: AC
  Administered 2014-05-18: 555 mg via INTRAVENOUS
  Filled 2014-05-18: qty 22.2

## 2014-05-18 MED ORDER — SODIUM CHLORIDE 0.9 % IV SOLN
Freq: Once | INTRAVENOUS | Status: AC
  Administered 2014-05-18: 14:00:00 via INTRAVENOUS

## 2014-05-18 MED ORDER — DIPHENHYDRAMINE HCL 50 MG/ML IJ SOLN
INTRAMUSCULAR | Status: AC
  Filled 2014-05-18: qty 1

## 2014-05-18 MED ORDER — FAMOTIDINE IN NACL 20-0.9 MG/50ML-% IV SOLN
INTRAVENOUS | Status: AC
  Filled 2014-05-18: qty 50

## 2014-05-18 MED ORDER — DIPHENHYDRAMINE HCL 25 MG PO CAPS
ORAL_CAPSULE | ORAL | Status: AC
  Filled 2014-05-18: qty 2

## 2014-05-18 MED ORDER — ACETAMINOPHEN 325 MG PO TABS
650.0000 mg | ORAL_TABLET | Freq: Once | ORAL | Status: AC
Start: 2014-05-18 — End: 2014-05-18
  Administered 2014-05-18: 650 mg via ORAL

## 2014-05-18 MED ORDER — POTASSIUM CHLORIDE CRYS ER 20 MEQ PO TBCR: 20.0000 meq | EXTENDED_RELEASE_TABLET | ORAL | Status: DC

## 2014-05-18 MED ORDER — DEXAMETHASONE SODIUM PHOSPHATE 20 MG/5ML IJ SOLN
INTRAMUSCULAR | Status: AC
  Filled 2014-05-18: qty 5

## 2014-05-18 MED ORDER — ACETAMINOPHEN 325 MG PO TABS
ORAL_TABLET | ORAL | Status: AC
  Filled 2014-05-18: qty 2

## 2014-05-18 NOTE — Patient Instructions (Signed)
[facility] Discharge Instructions for Patients Receiving Chemotherapy  Today you received the following chemotherapy agents Elotuzumab.  To help prevent nausea and vomiting after your treatment, we encourage you to take your nausea medication as prescribed.   If you develop nausea and vomiting that is not controlled by your nausea medication, call the clinic.   BELOW ARE SYMPTOMS THAT SHOULD BE REPORTED IMMEDIATELY:  *FEVER GREATER THAN 100.5 F  *CHILLS WITH OR WITHOUT FEVER  NAUSEA AND VOMITING THAT IS NOT CONTROLLED WITH YOUR NAUSEA MEDICATION  *UNUSUAL SHORTNESS OF BREATH  *UNUSUAL BRUISING OR BLEEDING  TENDERNESS IN MOUTH AND THROAT WITH OR WITHOUT PRESENCE OF ULCERS  *URINARY PROBLEMS  *BOWEL PROBLEMS  UNUSUAL RASH Items with * indicate a potential emergency and should be followed up as soon as possible.  Feel free to call the clinic you have any questions or concerns. The clinic phone number is [phone].

## 2014-05-18 NOTE — Telephone Encounter (Signed)
K= 3.3. Per Ned Card kdur 20 meq daily rx sent to pt pharmacy.Pt notified.

## 2014-05-18 NOTE — Progress Notes (Signed)
05/18/2014  Cycle 2, Day 8 [name] study Met with patient and her daughter, Maudie Mercury.  Confirmed with patient and her daughter that patient took her Dexamethasone 28 mg at 6:30 am today.  Also confirmed pt is taking her Revlimid daily as prescribed.  Spoke with treating RN, Sabino Gasser, re: treatment instructions and research signs with pre-meds, length of infusion, filter to use,  drug reaction information were provided.  05/18/2014 1440-spoke with Clydia Llano, RN.  She is aware of the Potassium=3.3 from today and will follow-up with MD.  05/25/2014 1330 cycle 2, day 15  [name] study  Met with patient and her daughter, Maudie Mercury. Confirmed with patient and her daughter that patient took her Dexamethasone 28 mg at 7 am today. Also confirmed pt is taking her Revlimid daily as prescribed. Spoke with treating RN, Amy Horton, re: treatment instructions.  Provided research signs with pre-meds, length of infusion, filter to use, drug reaction information were provided. Treating RN was without questions.   Gave Mrs. Revels and Maudie Mercury a copy of a medication calender for October.  Reviewed the medications for cycle 2, day 22.  Will meet next week to review October med schedule for cycle 3. They were without questions.  06/01/2014 1450  Cycle 2, day 22 [name] study Met with patient and her daughters.  Confirmed with patient that she took her Dexamethasone 28 mg today at 8 am.  Confirmed patient took last dose of Lenalidomide on 05/31/14 to complete the 21 day cycle.  Patient was seen by Ned Card NP and Dr. Julien Nordmann today.  She was cleared to continue with current treatment plan.  Drug kit assignment was made through [facility], the [facility],  and taken to pharmacist. Spoke with Ann Lions, RN ,treating infusion nurse, about the timing and delivery of Elotuzumab.  Provided written instructions on the timing of premedications with regard to the start of Elotuzumab and the requirement of administering through a 0.22 micron filter.  Also  provided RN with written guidance on transfusion reaction and recommendations for managing transfusion reaction.  Patient and her daughter understand that she will not take Lenalidomide until 06/08/14 (cycle 3, day 1) unless directed otherwise per MD.  Patient and daughter without questions.  06/07/2014  1414 Dr. Julien Nordmann shown and is aware of lab results drawn from 06/01/14 for repeat myeloma panel.  Per Dr. Julien Nordmann, "Contiune to treat on protocol with the Elotuzumab".

## 2014-05-19 ENCOUNTER — Telehealth: Payer: Self-pay | Admitting: *Deleted

## 2014-05-19 NOTE — Telephone Encounter (Signed)
Called and informed patient son Edd Arbour) for patient to push oral fluids. Pt son verbalized understanding and denies any questions or concerns at this time.

## 2014-05-24 ENCOUNTER — Other Ambulatory Visit: Payer: Medicare Other

## 2014-05-24 ENCOUNTER — Ambulatory Visit: Payer: Medicare Other

## 2014-05-24 ENCOUNTER — Other Ambulatory Visit: Payer: Self-pay | Admitting: *Deleted

## 2014-05-24 DIAGNOSIS — C9000 Multiple myeloma not having achieved remission: Secondary | ICD-10-CM

## 2014-05-24 MED ORDER — OXYCODONE HCL 5 MG PO CAPS: 5.0000 mg | ORAL_CAPSULE | Freq: Four times a day (QID) | ORAL | Status: DC | PRN

## 2014-05-25 ENCOUNTER — Ambulatory Visit: Payer: Medicare Other

## 2014-05-25 ENCOUNTER — Ambulatory Visit: Payer: Medicare Other | Admitting: Pharmacist

## 2014-05-25 ENCOUNTER — Other Ambulatory Visit: Payer: Medicare Other

## 2014-05-25 ENCOUNTER — Other Ambulatory Visit (HOSPITAL_BASED_OUTPATIENT_CLINIC_OR_DEPARTMENT_OTHER): Payer: Medicare Other

## 2014-05-25 ENCOUNTER — Ambulatory Visit (HOSPITAL_BASED_OUTPATIENT_CLINIC_OR_DEPARTMENT_OTHER): Payer: Medicare Other

## 2014-05-25 VITALS — BP 114/53 | HR 83 | Temp 97.5°F | Resp 20

## 2014-05-25 DIAGNOSIS — Z7901 Long term (current) use of anticoagulants: Secondary | ICD-10-CM

## 2014-05-25 DIAGNOSIS — C9000 Multiple myeloma not having achieved remission: Secondary | ICD-10-CM

## 2014-05-25 DIAGNOSIS — I82409 Acute embolism and thrombosis of unspecified deep veins of unspecified lower extremity: Secondary | ICD-10-CM

## 2014-05-25 DIAGNOSIS — I82401 Acute embolism and thrombosis of unspecified deep veins of right lower extremity: Secondary | ICD-10-CM

## 2014-05-25 DIAGNOSIS — Z5112 Encounter for antineoplastic immunotherapy: Secondary | ICD-10-CM

## 2014-05-25 DIAGNOSIS — C9002 Multiple myeloma in relapse: Secondary | ICD-10-CM

## 2014-05-25 LAB — COMPREHENSIVE METABOLIC PANEL (CC13)
ALT: 10 U/L (ref 0–55)
AST: 11 U/L (ref 5–34)
Albumin: 3.2 g/dL — ABNORMAL LOW (ref 3.5–5.0)
Alkaline Phosphatase: 57 U/L (ref 40–150)
Anion Gap: 14 mEq/L — ABNORMAL HIGH (ref 3–11)
BUN: 14.8 mg/dL (ref 7.0–26.0)
CO2: 23 mEq/L (ref 22–29)
Calcium: 9.3 mg/dL (ref 8.4–10.4)
Chloride: 104 mEq/L (ref 98–109)
Creatinine: 1.1 mg/dL (ref 0.6–1.1)
Glucose: 313 mg/dl — ABNORMAL HIGH (ref 70–140)
Potassium: 3.7 mEq/L (ref 3.5–5.1)
Sodium: 141 mEq/L (ref 136–145)
Total Bilirubin: 0.4 mg/dL (ref 0.20–1.20)
Total Protein: 6.3 g/dL — ABNORMAL LOW (ref 6.4–8.3)

## 2014-05-25 LAB — CBC WITH DIFFERENTIAL/PLATELET
BASO%: 0.2 % (ref 0.0–2.0)
Basophils Absolute: 0 10*3/uL (ref 0.0–0.1)
EOS%: 0.1 % (ref 0.0–7.0)
Eosinophils Absolute: 0 10*3/uL (ref 0.0–0.5)
HCT: 35.9 % (ref 34.8–46.6)
HGB: 11.2 g/dL — ABNORMAL LOW (ref 11.6–15.9)
LYMPH#: 0.3 10*3/uL — AB (ref 0.9–3.3)
LYMPH%: 5.7 % — AB (ref 14.0–49.7)
MCH: 29.5 pg (ref 25.1–34.0)
MCHC: 31.2 g/dL — AB (ref 31.5–36.0)
MCV: 94.4 fL (ref 79.5–101.0)
MONO#: 0 10*3/uL — ABNORMAL LOW (ref 0.1–0.9)
MONO%: 0.8 % (ref 0.0–14.0)
NEUT#: 4.4 10*3/uL (ref 1.5–6.5)
NEUT%: 93.2 % — ABNORMAL HIGH (ref 38.4–76.8)
Platelets: 180 10*3/uL (ref 145–400)
RBC: 3.8 10*6/uL (ref 3.70–5.45)
RDW: 17.1 % — ABNORMAL HIGH (ref 11.2–14.5)
WBC: 4.8 10*3/uL (ref 3.9–10.3)

## 2014-05-25 LAB — PROTHROMBIN TIME
INR: 3.82 — ABNORMAL HIGH (ref <1.50)
Prothrombin Time: 37.6 seconds — ABNORMAL HIGH (ref 11.6–15.2)

## 2014-05-25 LAB — POCT INR: INR: 3.61

## 2014-05-25 LAB — PROTIME-INR

## 2014-05-25 MED ORDER — DEXAMETHASONE SODIUM PHOSPHATE 10 MG/ML IJ SOLN
INTRAMUSCULAR | Status: AC
  Filled 2014-05-25: qty 1

## 2014-05-25 MED ORDER — DEXAMETHASONE SODIUM PHOSPHATE 10 MG/ML IJ SOLN
8.0000 mg | Freq: Once | INTRAMUSCULAR | Status: AC
  Administered 2014-05-25: 8 mg via INTRAVENOUS

## 2014-05-25 MED ORDER — SODIUM CHLORIDE 0.9 % IV SOLN
Freq: Once | INTRAVENOUS | Status: AC
  Administered 2014-05-25: 14:00:00 via INTRAVENOUS

## 2014-05-25 MED ORDER — FAMOTIDINE IN NACL 20-0.9 MG/50ML-% IV SOLN
20.0000 mg | Freq: Once | INTRAVENOUS | Status: AC
  Administered 2014-05-25: 20 mg via INTRAVENOUS

## 2014-05-25 MED ORDER — ACETAMINOPHEN 325 MG PO TABS
650.0000 mg | ORAL_TABLET | Freq: Once | ORAL | Status: AC
  Administered 2014-05-25: 650 mg via ORAL

## 2014-05-25 MED ORDER — FAMOTIDINE IN NACL 20-0.9 MG/50ML-% IV SOLN
INTRAVENOUS | Status: AC
  Filled 2014-05-25: qty 50

## 2014-05-25 MED ORDER — ACETAMINOPHEN 325 MG PO TABS
ORAL_TABLET | ORAL | Status: AC
  Filled 2014-05-25: qty 2

## 2014-05-25 MED ORDER — DIPHENHYDRAMINE HCL 50 MG/ML IJ SOLN
INTRAMUSCULAR | Status: AC
  Filled 2014-05-25: qty 1

## 2014-05-25 MED ORDER — ZOLEDRONIC ACID 4 MG/100ML IV SOLN
4.0000 mg | Freq: Once | INTRAVENOUS | Status: AC
  Administered 2014-05-25: 4 mg via INTRAVENOUS
  Filled 2014-05-25: qty 100

## 2014-05-25 MED ORDER — SODIUM CHLORIDE 0.9 % IV SOLN
10.0000 mg/kg | Freq: Once | INTRAVENOUS | Status: AC
  Administered 2014-05-25: 555 mg via INTRAVENOUS
  Filled 2014-05-25: qty 22.2

## 2014-05-25 MED ORDER — DIPHENHYDRAMINE HCL 25 MG PO CAPS
ORAL_CAPSULE | ORAL | Status: AC
  Filled 2014-05-25: qty 2

## 2014-05-25 MED ORDER — DIPHENHYDRAMINE HCL 25 MG PO CAPS
50.0000 mg | ORAL_CAPSULE | Freq: Once | ORAL | Status: AC
  Administered 2014-05-25: 50 mg via ORAL

## 2014-05-25 NOTE — Patient Instructions (Signed)
[facility] Discharge Instructions for Patients Receiving Chemotherapy  Today you received the following chemotherapy agents Elotuzumab  To help prevent nausea and vomiting after your treatment, we encourage you to take your nausea medication Compazine 10 mg every 6 hours as needed   If you develop nausea and vomiting that is not controlled by your nausea medication, call the clinic.   BELOW ARE SYMPTOMS THAT SHOULD BE REPORTED IMMEDIATELY:  *FEVER GREATER THAN 100.5 F  *CHILLS WITH OR WITHOUT FEVER  NAUSEA AND VOMITING THAT IS NOT CONTROLLED WITH YOUR NAUSEA MEDICATION  *UNUSUAL SHORTNESS OF BREATH  *UNUSUAL BRUISING OR BLEEDING  TENDERNESS IN MOUTH AND THROAT WITH OR WITHOUT PRESENCE OF ULCERS  *URINARY PROBLEMS  *BOWEL PROBLEMS  UNUSUAL RASH Items with * indicate a potential emergency and should be followed up as soon as possible.  Feel free to call the clinic you have any questions or concerns. The clinic phone number is [phone].

## 2014-05-25 NOTE — Patient Instructions (Signed)
Continue coumadin 2.5mg  daily except 5mg  on Mon&Wed.   Recheck PT/INR on 06/01/14 at 1:15 pm for lab & a pharmacist will see you during treatment that day around 2:45

## 2014-05-25 NOTE — Progress Notes (Signed)
Pt seen during infusion INR was sent out for confirmation and returned at 3.61 Will not make any changes as she has been on this dose for quite sometime No other changes to report No diet changes or med changes  Continue coumadin 2.5mg  daily except 5mg  on Mon&Wed.   Recheck PT/INR on 06/01/14 at 1:15 pm for lab & a pharmacist will see you during treatment that day around 2:45

## 2014-05-29 ENCOUNTER — Ambulatory Visit (INDEPENDENT_AMBULATORY_CARE_PROVIDER_SITE_OTHER): Payer: Medicare Other | Admitting: Family Medicine

## 2014-05-29 ENCOUNTER — Encounter: Payer: Self-pay | Admitting: Family Medicine

## 2014-05-29 VITALS — BP 114/70 | HR 78 | Temp 98.0°F | Wt 122.5 lb

## 2014-05-29 DIAGNOSIS — L03039 Cellulitis of unspecified toe: Secondary | ICD-10-CM | POA: Insufficient documentation

## 2014-05-29 DIAGNOSIS — L03032 Cellulitis of left toe: Secondary | ICD-10-CM

## 2014-05-29 DIAGNOSIS — L02619 Cutaneous abscess of unspecified foot: Secondary | ICD-10-CM

## 2014-05-29 MED ORDER — CEPHALEXIN 500 MG PO CAPS: 500.0000 mg | ORAL_CAPSULE | Freq: Four times a day (QID) | ORAL | Status: DC

## 2014-05-29 NOTE — Progress Notes (Signed)
Pre visit review using our clinic review tool, if applicable. No additional management support is needed unless otherwise documented below in the visit note.  L 1st toe pain.  Started about 1 week ago.  Can't get a sock or shoe w/o pain.  No trauma, no clear trigger.    L jaw cyst is slightly bigger than prev, but not bothersome and patient has talked to oncology about it.  No intervention planned.    Meds, vitals, and allergies reviewed.   ROS: See HPI.  Otherwise, noncontributory.  Nad In wheelchair.  Cyst on L side of mandible noted L 1st toe lateral side of nail irritated and sore but no fluctuant mass.  Nail not ingrown.  Normal DP pulse No skin breakdown on the L foot

## 2014-05-29 NOTE — Patient Instructions (Signed)
Start the antibiotics today and soak your foot in warm salt water.  Have your foot rechecked at the cancer center later this week.  Take care.

## 2014-05-30 ENCOUNTER — Other Ambulatory Visit: Payer: Self-pay | Admitting: Family Medicine

## 2014-05-30 NOTE — Assessment & Plan Note (Signed)
Looks to be mild, no need for I&D.  Will start abx and soaks at this point (soaks mainly to keep the nail pliable) and have her get rechecked at the cancer center later this week.  I didn't adjust her coumadin as I wouldn't expect the keflex to cause sig INR elevation.  Appreciate help from all at the cancer center.   Patient and family agree with plan.

## 2014-06-01 ENCOUNTER — Ambulatory Visit (HOSPITAL_BASED_OUTPATIENT_CLINIC_OR_DEPARTMENT_OTHER): Payer: Self-pay | Admitting: Pharmacist

## 2014-06-01 ENCOUNTER — Ambulatory Visit (HOSPITAL_BASED_OUTPATIENT_CLINIC_OR_DEPARTMENT_OTHER): Payer: Medicare Other

## 2014-06-01 ENCOUNTER — Other Ambulatory Visit: Payer: Self-pay | Admitting: Nurse Practitioner

## 2014-06-01 ENCOUNTER — Telehealth: Payer: Self-pay | Admitting: Internal Medicine

## 2014-06-01 ENCOUNTER — Ambulatory Visit (HOSPITAL_BASED_OUTPATIENT_CLINIC_OR_DEPARTMENT_OTHER): Payer: Medicare Other | Admitting: Nurse Practitioner

## 2014-06-01 ENCOUNTER — Other Ambulatory Visit (HOSPITAL_BASED_OUTPATIENT_CLINIC_OR_DEPARTMENT_OTHER): Payer: Medicare Other

## 2014-06-01 VITALS — BP 123/78 | HR 79 | Temp 97.9°F | Resp 19 | Ht <= 58 in | Wt 122.3 lb

## 2014-06-01 DIAGNOSIS — I82401 Acute embolism and thrombosis of unspecified deep veins of right lower extremity: Secondary | ICD-10-CM

## 2014-06-01 DIAGNOSIS — C9000 Multiple myeloma not having achieved remission: Secondary | ICD-10-CM

## 2014-06-01 DIAGNOSIS — C9002 Multiple myeloma in relapse: Secondary | ICD-10-CM

## 2014-06-01 DIAGNOSIS — Z5111 Encounter for antineoplastic chemotherapy: Secondary | ICD-10-CM

## 2014-06-01 DIAGNOSIS — I825Z2 Chronic embolism and thrombosis of unspecified deep veins of left distal lower extremity: Secondary | ICD-10-CM

## 2014-06-01 DIAGNOSIS — L03032 Cellulitis of left toe: Secondary | ICD-10-CM

## 2014-06-01 DIAGNOSIS — Z7901 Long term (current) use of anticoagulants: Secondary | ICD-10-CM

## 2014-06-01 LAB — CBC WITH DIFFERENTIAL/PLATELET
BASO%: 0 % (ref 0.0–2.0)
Basophils Absolute: 0 10*3/uL (ref 0.0–0.1)
EOS ABS: 0 10*3/uL (ref 0.0–0.5)
EOS%: 0.3 % (ref 0.0–7.0)
HCT: 35.9 % (ref 34.8–46.6)
HGB: 11.4 g/dL — ABNORMAL LOW (ref 11.6–15.9)
LYMPH%: 5.7 % — ABNORMAL LOW (ref 14.0–49.7)
MCH: 30 pg (ref 25.1–34.0)
MCHC: 31.8 g/dL (ref 31.5–36.0)
MCV: 94.5 fL (ref 79.5–101.0)
MONO#: 0.1 10*3/uL (ref 0.1–0.9)
MONO%: 1.3 % (ref 0.0–14.0)
NEUT%: 92.7 % — ABNORMAL HIGH (ref 38.4–76.8)
NEUTROS ABS: 3.6 10*3/uL (ref 1.5–6.5)
Platelets: 193 10*3/uL (ref 145–400)
RBC: 3.8 10*6/uL (ref 3.70–5.45)
RDW: 16.3 % — ABNORMAL HIGH (ref 11.2–14.5)
WBC: 3.9 10*3/uL (ref 3.9–10.3)
lymph#: 0.2 10*3/uL — ABNORMAL LOW (ref 0.9–3.3)

## 2014-06-01 LAB — COMPREHENSIVE METABOLIC PANEL (CC13)
ALT: 14 U/L (ref 0–55)
AST: 14 U/L (ref 5–34)
Albumin: 3.3 g/dL — ABNORMAL LOW (ref 3.5–5.0)
Alkaline Phosphatase: 58 U/L (ref 40–150)
Anion Gap: 12 mEq/L — ABNORMAL HIGH (ref 3–11)
BILIRUBIN TOTAL: 0.47 mg/dL (ref 0.20–1.20)
BUN: 17.1 mg/dL (ref 7.0–26.0)
CO2: 25 mEq/L (ref 22–29)
Calcium: 9.9 mg/dL (ref 8.4–10.4)
Chloride: 103 mEq/L (ref 98–109)
Creatinine: 1.2 mg/dL — ABNORMAL HIGH (ref 0.6–1.1)
Glucose: 306 mg/dl — ABNORMAL HIGH (ref 70–140)
POTASSIUM: 4.2 meq/L (ref 3.5–5.1)
Sodium: 139 mEq/L (ref 136–145)
Total Protein: 6.4 g/dL (ref 6.4–8.3)

## 2014-06-01 LAB — PROTIME-INR
INR: 4.4 — AB (ref 2.00–3.50)
PROTIME: 52.8 s — AB (ref 10.6–13.4)

## 2014-06-01 LAB — POCT INR: INR: 4.4

## 2014-06-01 MED ORDER — DEXAMETHASONE SODIUM PHOSPHATE 10 MG/ML IJ SOLN
INTRAMUSCULAR | Status: AC
  Filled 2014-06-01: qty 1

## 2014-06-01 MED ORDER — SODIUM CHLORIDE 0.9 % IV SOLN
10.0000 mg/kg | Freq: Once | INTRAVENOUS | Status: AC
  Administered 2014-06-01: 555 mg via INTRAVENOUS
  Filled 2014-06-01: qty 22.2

## 2014-06-01 MED ORDER — LENALIDOMIDE 25 MG PO CAPS: 25.0000 mg | ORAL_CAPSULE | Freq: Every day | ORAL | Status: DC

## 2014-06-01 MED ORDER — SODIUM CHLORIDE 0.9 % IV SOLN
Freq: Once | INTRAVENOUS | Status: AC
  Administered 2014-06-01: 15:00:00 via INTRAVENOUS

## 2014-06-01 MED ORDER — DEXAMETHASONE SODIUM PHOSPHATE 10 MG/ML IJ SOLN
8.0000 mg | Freq: Once | INTRAMUSCULAR | Status: AC
  Administered 2014-06-01: 8 mg via INTRAVENOUS

## 2014-06-01 MED ORDER — DIPHENHYDRAMINE HCL 25 MG PO CAPS
50.0000 mg | ORAL_CAPSULE | Freq: Once | ORAL | Status: AC
  Administered 2014-06-01: 50 mg via ORAL

## 2014-06-01 MED ORDER — ACETAMINOPHEN 325 MG PO TABS
ORAL_TABLET | ORAL | Status: AC
  Filled 2014-06-01: qty 2

## 2014-06-01 MED ORDER — FAMOTIDINE IN NACL 20-0.9 MG/50ML-% IV SOLN
INTRAVENOUS | Status: AC
  Filled 2014-06-01: qty 50

## 2014-06-01 MED ORDER — DIPHENHYDRAMINE HCL 25 MG PO CAPS
ORAL_CAPSULE | ORAL | Status: AC
  Filled 2014-06-01: qty 2

## 2014-06-01 MED ORDER — ACETAMINOPHEN 325 MG PO TABS
650.0000 mg | ORAL_TABLET | Freq: Once | ORAL | Status: AC
  Administered 2014-06-01: 650 mg via ORAL

## 2014-06-01 MED ORDER — FAMOTIDINE IN NACL 20-0.9 MG/50ML-% IV SOLN
20.0000 mg | Freq: Once | INTRAVENOUS | Status: AC
  Administered 2014-06-01: 20 mg via INTRAVENOUS

## 2014-06-01 NOTE — Progress Notes (Signed)
INR above goal today again Pt seen in infusion area Pt is appears well today No missed or extra doses No unusual bleeding or bruising Pt has possible toe infection on left toe and is currently on Keflex QID x 10 days (currently day 4/10) This could be a reason why INR is elevated INR was elevated last visit as well Appetite may be another factor. Pt is eating but has to force herself to eat  No other changes to report Will make a 20% dose decrease after pt holds coumadin x 2 days Plan:  Hold coumadin x 2 days (today and Friday)  Then decrease dose to 2.5 mg daily Recheck PT/INR on 06/08/14 at 1:00 pm for lab, 1:45pm for infusion & a pharmacist will see you during treatment

## 2014-06-01 NOTE — Telephone Encounter (Signed)
gv dtr appt schedule for oct. per dtr pt to have additional labs drawn while in inf. kim in lab informed.

## 2014-06-01 NOTE — Patient Instructions (Signed)
[name] Discharge Instructions for Patients Receiving Chemotherapy  Today you received the following chemotherapy agents: Elotuzumab  To help prevent nausea and vomiting after your treatment, we encourage you to take your nausea medication as prescribed by your physician.    If you develop nausea and vomiting that is not controlled by your nausea medication, call the clinic.   BELOW ARE SYMPTOMS THAT SHOULD BE REPORTED IMMEDIATELY:  *FEVER GREATER THAN 100.5 F  *CHILLS WITH OR WITHOUT FEVER  NAUSEA AND VOMITING THAT IS NOT CONTROLLED WITH YOUR NAUSEA MEDICATION  *UNUSUAL SHORTNESS OF BREATH  *UNUSUAL BRUISING OR BLEEDING  TENDERNESS IN MOUTH AND THROAT WITH OR WITHOUT PRESENCE OF ULCERS  *URINARY PROBLEMS  *BOWEL PROBLEMS  UNUSUAL RASH Items with * indicate a potential emergency and should be followed up as soon as possible.  Feel free to call the clinic you have any questions or concerns. The clinic phone number is [phone].

## 2014-06-01 NOTE — Progress Notes (Addendum)
[place] OFFICE PROGRESS NOTE   Diagnosis:  Multiple myeloma  INTERVAL HISTORY:   [name] returns as scheduled. She continues Elotuzumab, Revlimid and Dexamethasone as per the expanded access treatment protocol. She denies nausea/vomiting. No mouth sores. No diarrhea or constipation. She has stable chronic leg pain. Last week she had a rash on the left side of her face. Her daughter describes the rash as slightly red and raised. The rash has resolved. Appetite continues to be poor. She has stable mild numbness in the fingertips. She denies shortness breath, cough and fever.  She was seen by her primary care provider, Dr. [name], on 05/29/2014 for evaluation of left great toe pain. She was started on Keflex. She continues to have pain. Her daughter notes that the toe overall appears improved.  Objective:  Vital signs in last 24 hours:  Blood pressure 123/78, pulse 79, temperature 97.9 F (36.6 C), temperature source Oral, resp. rate 19, height _0  (1.473 m), weight 122 lb 4.8 oz (55.475 kg).    HEENT: No thrush or ulcers. Question cyst left mandible region. Resp: Lungs clear bilaterally. Cardio: Regular rate and rhythm. GI: Abdomen soft and nontender. No organomegaly. Vascular: No leg edema. Neuro: Alert and oriented. Follows commands.  Skin: Lateral edge left great toenail with tenderness, no erythema or fluctuance. Question small ecchymosis.   Lab Results:  Lab Results  Component Value Date   WBC 3.9 06/01/2014   HGB 11.4* 06/01/2014   HCT 35.9 06/01/2014   MCV 94.5 06/01/2014   PLT 193 06/01/2014   NEUTROABS 3.6 06/01/2014    Imaging:  No results found.  Medications: I have reviewed the patient's current medications.  Assessment/Plan: 1. Multiple myeloma diagnosed September 2009 treated in the past with Revlimid/dexamethasone; weekly subcutaneous Velcade discontinued April 2014 for disease progression; Pomalidomide April 2014 through July 2015  discontinued for disease progression; initiation of Elotuzumab/Revlimd/Dexamethasone on the expanded access treatment protocol 04/13/2014. 2. Mild infection left great toe. She is completing a course of Keflex. 3. History of bilateral lower from the deep vein thrombosis November 2009. Acute on chronic deep vein thrombosis left lower extremity October 2010. Maintained on chronic Coumadin anticoagulation managed through the [facility].   Disposition: [name] will complete cycle 2 Elotuzumab today. She continues Revlimid and dexamethasone. We are obtaining repeat myeloma labs today.  She is scheduled to return to begin cycle 3 Elotuzumab in one week. She will return for a followup visit on 06/22/2014. She will contact the office in the interim with any problems. We specifically discussed worsening of the left great toe infection.    Ned Card ANP/GNP-BC   06/01/2014  3:55 PM  ADDENDUM: Hematology/Oncology Attending: I had a face to face encounter with the patient. I recommended her care plan. She is a very pleasant 78 years old [place] American female with relapsed multiple myeloma who is currently on treatment with Elotuzumab, abdomen and dexamethasone status post 2 months of treatment. The patient is tolerating her treatment fairly well with no significant adverse effects. I recommended for her to proceed with her treatment today as scheduled. She would have a repeat myeloma panel performed today for evaluation of her disease before changing Elotuzumab to biweekly schedule. She would come back for followup visit in 3 weeks for reevaluation. She was advised to call immediately if she has any concerning symptoms in the interval.  Disclaimer: This note was dictated with voice recognition software. Similar sounding words can inadvertently be transcribed and  may be missed upon review. Eilleen Kempf., MD 06/02/2014

## 2014-06-01 NOTE — Patient Instructions (Signed)
INR above goal Hold coumadin x 2 days (today and Friday)  Then decrease dose to 2.5 mg daily Recheck PT/INR on 06/08/14 at 1:00 pm for lab, 1:45pm for infusion & a pharmacist will see you during treatment

## 2014-06-05 LAB — PROTEIN ELECTROPHORESIS, SERUM
ALBUMIN ELP: 55.2 % — AB (ref 55.8–66.1)
Alpha-1-Globulin: 13.2 % — ABNORMAL HIGH (ref 2.9–4.9)
Alpha-2-Globulin: 13.9 % — ABNORMAL HIGH (ref 7.1–11.8)
BETA GLOBULIN: 7.7 % — AB (ref 4.7–7.2)
Beta 2: 3.3 % (ref 3.2–6.5)
Gamma Globulin: 6.7 % — ABNORMAL LOW (ref 11.1–18.8)
M-Spike, %: 0.17 g/dL
TOTAL PROTEIN, SERUM ELECTROPHOR: 6.2 g/dL (ref 6.0–8.3)

## 2014-06-05 LAB — IGG, IGA, IGM
IgA: 39 mg/dL — ABNORMAL LOW (ref 69–380)
IgG (Immunoglobin G), Serum: 417 mg/dL — ABNORMAL LOW (ref 690–1700)
IgM, Serum: 64 mg/dL (ref 52–322)

## 2014-06-05 LAB — BETA 2 MICROGLOBULIN, SERUM: Beta-2 Microglobulin: 3.12 mg/L — ABNORMAL HIGH (ref <=2.51)

## 2014-06-05 LAB — KAPPA/LAMBDA LIGHT CHAINS
Kappa free light chain: 0.23 mg/dL — ABNORMAL LOW (ref 0.33–1.94)
Kappa:Lambda Ratio: 0 — ABNORMAL LOW (ref 0.26–1.65)
LAMBDA FREE LGHT CHN: 66.2 mg/dL — AB (ref 0.57–2.63)

## 2014-06-07 ENCOUNTER — Telehealth: Payer: Self-pay | Admitting: *Deleted

## 2014-06-07 ENCOUNTER — Other Ambulatory Visit: Payer: Self-pay | Admitting: *Deleted

## 2014-06-07 DIAGNOSIS — C9000 Multiple myeloma not having achieved remission: Secondary | ICD-10-CM

## 2014-06-07 MED ORDER — OXYCODONE HCL 5 MG PO CAPS: 5.0000 mg | ORAL_CAPSULE | Freq: Four times a day (QID) | ORAL | Status: DC | PRN

## 2014-06-07 NOTE — Telephone Encounter (Signed)
Pt's daughter [name] called stating that pt is experiencing generalized mouth pain.  No sores, bleeding, or thrush noted.  Per Dr Vista Mink, can use salt water rinse for now, advised she call if it gets worse or changes.  [name] verbalized understanding

## 2014-06-08 ENCOUNTER — Other Ambulatory Visit (HOSPITAL_BASED_OUTPATIENT_CLINIC_OR_DEPARTMENT_OTHER): Payer: Medicare Other

## 2014-06-08 ENCOUNTER — Encounter: Payer: Self-pay | Admitting: *Deleted

## 2014-06-08 ENCOUNTER — Other Ambulatory Visit: Payer: Self-pay | Admitting: Internal Medicine

## 2014-06-08 ENCOUNTER — Other Ambulatory Visit: Payer: Self-pay | Admitting: *Deleted

## 2014-06-08 ENCOUNTER — Ambulatory Visit: Payer: Medicare Other | Admitting: Pharmacist

## 2014-06-08 ENCOUNTER — Ambulatory Visit (HOSPITAL_BASED_OUTPATIENT_CLINIC_OR_DEPARTMENT_OTHER): Payer: Medicare Other

## 2014-06-08 VITALS — BP 113/87 | HR 87 | Temp 98.1°F | Resp 20

## 2014-06-08 DIAGNOSIS — I82401 Acute embolism and thrombosis of unspecified deep veins of right lower extremity: Secondary | ICD-10-CM

## 2014-06-08 DIAGNOSIS — C9000 Multiple myeloma not having achieved remission: Secondary | ICD-10-CM

## 2014-06-08 DIAGNOSIS — C9002 Multiple myeloma in relapse: Secondary | ICD-10-CM

## 2014-06-08 DIAGNOSIS — Z7901 Long term (current) use of anticoagulants: Secondary | ICD-10-CM

## 2014-06-08 DIAGNOSIS — Z5112 Encounter for antineoplastic immunotherapy: Secondary | ICD-10-CM

## 2014-06-08 LAB — CBC WITH DIFFERENTIAL/PLATELET
BASO%: 0.4 % (ref 0.0–2.0)
BASOS ABS: 0 10*3/uL (ref 0.0–0.1)
EOS ABS: 0 10*3/uL (ref 0.0–0.5)
EOS%: 0 % (ref 0.0–7.0)
HEMATOCRIT: 34.3 % — AB (ref 34.8–46.6)
HEMOGLOBIN: 10.9 g/dL — AB (ref 11.6–15.9)
LYMPH#: 0.2 10*3/uL — AB (ref 0.9–3.3)
LYMPH%: 6.1 % — ABNORMAL LOW (ref 14.0–49.7)
MCH: 29.8 pg (ref 25.1–34.0)
MCHC: 31.8 g/dL (ref 31.5–36.0)
MCV: 93.7 fL (ref 79.5–101.0)
MONO#: 0.1 10*3/uL (ref 0.1–0.9)
MONO%: 3.2 % (ref 0.0–14.0)
NEUT%: 90.3 % — ABNORMAL HIGH (ref 38.4–76.8)
NEUTROS ABS: 2.5 10*3/uL (ref 1.5–6.5)
Platelets: 251 10*3/uL (ref 145–400)
RBC: 3.66 10*6/uL — ABNORMAL LOW (ref 3.70–5.45)
RDW: 16.4 % — ABNORMAL HIGH (ref 11.2–14.5)
WBC: 2.8 10*3/uL — ABNORMAL LOW (ref 3.9–10.3)

## 2014-06-08 LAB — COMPREHENSIVE METABOLIC PANEL (CC13)
ALBUMIN: 3.3 g/dL — AB (ref 3.5–5.0)
ALT: 11 U/L (ref 0–55)
ANION GAP: 12 meq/L — AB (ref 3–11)
AST: 11 U/L (ref 5–34)
Alkaline Phosphatase: 60 U/L (ref 40–150)
BUN: 19.3 mg/dL (ref 7.0–26.0)
CALCIUM: 9.8 mg/dL (ref 8.4–10.4)
CHLORIDE: 106 meq/L (ref 98–109)
CO2: 22 meq/L (ref 22–29)
Creatinine: 1.4 mg/dL — ABNORMAL HIGH (ref 0.6–1.1)
GLUCOSE: 253 mg/dL — AB (ref 70–140)
Potassium: 3.9 mEq/L (ref 3.5–5.1)
Sodium: 141 mEq/L (ref 136–145)
Total Bilirubin: 0.38 mg/dL (ref 0.20–1.20)
Total Protein: 6.5 g/dL (ref 6.4–8.3)

## 2014-06-08 LAB — PROTIME-INR
INR: 1.2 — AB (ref 2.00–3.50)
PROTIME: 14.4 s — AB (ref 10.6–13.4)

## 2014-06-08 MED ORDER — FAMOTIDINE IN NACL 20-0.9 MG/50ML-% IV SOLN
INTRAVENOUS | Status: AC
  Filled 2014-06-08: qty 50

## 2014-06-08 MED ORDER — FAMOTIDINE IN NACL 20-0.9 MG/50ML-% IV SOLN
20.0000 mg | Freq: Once | INTRAVENOUS | Status: AC
  Administered 2014-06-08: 20 mg via INTRAVENOUS

## 2014-06-08 MED ORDER — SODIUM CHLORIDE 0.9 % IV SOLN
Freq: Once | INTRAVENOUS | Status: AC
  Administered 2014-06-08: 14:00:00 via INTRAVENOUS

## 2014-06-08 MED ORDER — DIPHENHYDRAMINE HCL 25 MG PO CAPS
ORAL_CAPSULE | ORAL | Status: AC
  Filled 2014-06-08: qty 2

## 2014-06-08 MED ORDER — DIPHENHYDRAMINE HCL 25 MG PO CAPS
50.0000 mg | ORAL_CAPSULE | Freq: Once | ORAL | Status: AC
  Administered 2014-06-08: 50 mg via ORAL

## 2014-06-08 MED ORDER — SODIUM CHLORIDE 0.9 % IV SOLN
10.0000 mg/kg | Freq: Once | INTRAVENOUS | Status: AC
  Administered 2014-06-08: 555 mg via INTRAVENOUS
  Filled 2014-06-08: qty 22.2

## 2014-06-08 MED ORDER — DEXAMETHASONE SODIUM PHOSPHATE 10 MG/ML IJ SOLN
8.0000 mg | Freq: Once | INTRAMUSCULAR | Status: AC
  Administered 2014-06-08: 8 mg via INTRAVENOUS

## 2014-06-08 MED ORDER — MAGIC MOUTHWASH W/LIDOCAINE: 5.0000 mL | Freq: Three times a day (TID) | ORAL | Status: DC | PRN

## 2014-06-08 MED ORDER — DEXAMETHASONE SODIUM PHOSPHATE 10 MG/ML IJ SOLN
INTRAMUSCULAR | Status: AC
  Filled 2014-06-08: qty 1

## 2014-06-08 MED ORDER — ALPRAZOLAM 0.5 MG PO TABS: 0.5000 mg | ORAL_TABLET | Freq: Two times a day (BID) | ORAL | Status: DC | PRN

## 2014-06-08 MED ORDER — ACETAMINOPHEN 325 MG PO TABS
650.0000 mg | ORAL_TABLET | Freq: Once | ORAL | Status: AC
Start: 2014-06-08 — End: 2014-06-08
  Administered 2014-06-08: 650 mg via ORAL

## 2014-06-08 MED ORDER — ACETAMINOPHEN 325 MG PO TABS
ORAL_TABLET | ORAL | Status: AC
  Filled 2014-06-08: qty 2

## 2014-06-08 NOTE — Patient Instructions (Signed)
[facility] Discharge Instructions for Patients Receiving Chemotherapy  Today you received the following chemotherapy agents Elotuzumab  To help prevent nausea and vomiting after your treatment, we encourage you to take your nausea medication  zofran as directed   If you develop nausea and vomiting that is not controlled by your nausea medication, call the clinic.   BELOW ARE SYMPTOMS THAT SHOULD BE REPORTED IMMEDIATELY:  *FEVER GREATER THAN 100.5 F  *CHILLS WITH OR WITHOUT FEVER  NAUSEA AND VOMITING THAT IS NOT CONTROLLED WITH YOUR NAUSEA MEDICATION  *UNUSUAL SHORTNESS OF BREATH  *UNUSUAL BRUISING OR BLEEDING  TENDERNESS IN MOUTH AND THROAT WITH OR WITHOUT PRESENCE OF ULCERS  *URINARY PROBLEMS  *BOWEL PROBLEMS  UNUSUAL RASH Items with * indicate a potential emergency and should be followed up as soon as possible.  Feel free to call the clinic you have any questions or concerns. The clinic phone number is [phone].

## 2014-06-08 NOTE — Progress Notes (Signed)
06/08/2014 1330  [name] study cycle 3, day 1 Met with patient and her daughter, [name].  [name] reports no major complaints today.  She stated she called MD yesterday about her mouth sore and that it is slightly better today.  She reports that her toe is much improved.  She denies increased weakness, bleeding, fever, or other complaints that are different from her baseline.  Her weight is 55.5 kg today. Patient reports taking her Decadron 28 mg  this morning at 0645.  She will start back on Lenalidomide today for 21 days. Reviewed with patient's daughter the schedule for taking medications per protocol.  She was without questions. Spoke with Dr. Mohamed re: above and he said he would approve and sign chemo orders. Communicated to pharmacy the elotuzumzb dose adjusted using cycle 3, day 1 weight.  Sign for infusion given to John Montgomery,RN. Reviewed the use of a filter, timing of pre-meds,and infusion rate. All of the above checked and verified with Gail Lott, RN.  

## 2014-06-08 NOTE — Telephone Encounter (Signed)
Pt in infusion wanting to know what to do about the "mouth sores".  She was swishing peroxide, not salt water.  Advised to not use peroxide.  Per Dr Vista Mink, okay to have magic mouthwash with lidocaine TID PRN swish and spit

## 2014-06-08 NOTE — Progress Notes (Signed)
INR = 1.2 Pt held Coumadin on 10/1 & 10/2 & then decreased Coumadin to 2.5 mg daily. She completed Keflex yesterday (10 days for toe infection) Pt c/o mouth sores.  She has been using Peroxide.  She called Dr. Worthy Flank RN about this.  The infusion RN today recommended saline mouth rinses. Pt has LLE edema.  Not painful.  This occurs intermittently.  No SOB/CP. INR low.  Likely due to held Coumadin doses when INR elevated. I will have pt take 5 mg tonight then take 2.5 mg daily. Repeat protime next week w/ lab appt on 10/15. Kennith Center, Pharm.D., CPP 06/08/2014@3 :02 PM

## 2014-06-14 ENCOUNTER — Other Ambulatory Visit: Payer: Self-pay | Admitting: Nurse Practitioner

## 2014-06-14 DIAGNOSIS — C9000 Multiple myeloma not having achieved remission: Secondary | ICD-10-CM

## 2014-06-14 MED ORDER — DEXAMETHASONE 4 MG PO TABS: ORAL_TABLET | ORAL | Status: DC

## 2014-06-14 NOTE — Progress Notes (Signed)
Per research nurse, Marcellus Scott, RN, patient to take 40 mg decadron on day 8 of cycle 3 on 06/15/14. Rx called to pharmacy for one-time dose. Patient will need to repeat this dose on day 22 of cycle 3 per protocol.

## 2014-06-15 ENCOUNTER — Encounter: Payer: Self-pay | Admitting: *Deleted

## 2014-06-15 ENCOUNTER — Other Ambulatory Visit (HOSPITAL_BASED_OUTPATIENT_CLINIC_OR_DEPARTMENT_OTHER): Payer: Medicare Other

## 2014-06-15 ENCOUNTER — Ambulatory Visit (HOSPITAL_BASED_OUTPATIENT_CLINIC_OR_DEPARTMENT_OTHER): Payer: Self-pay | Admitting: Pharmacist

## 2014-06-15 DIAGNOSIS — I82401 Acute embolism and thrombosis of unspecified deep veins of right lower extremity: Secondary | ICD-10-CM

## 2014-06-15 DIAGNOSIS — C9000 Multiple myeloma not having achieved remission: Secondary | ICD-10-CM

## 2014-06-15 DIAGNOSIS — Z7901 Long term (current) use of anticoagulants: Secondary | ICD-10-CM

## 2014-06-15 LAB — CBC WITH DIFFERENTIAL/PLATELET
BASO%: 0.2 % (ref 0.0–2.0)
Basophils Absolute: 0 10*3/uL (ref 0.0–0.1)
EOS%: 0 % (ref 0.0–7.0)
Eosinophils Absolute: 0 10*3/uL (ref 0.0–0.5)
HCT: 35.2 % (ref 34.8–46.6)
HGB: 11 g/dL — ABNORMAL LOW (ref 11.6–15.9)
LYMPH%: 6.4 % — ABNORMAL LOW (ref 14.0–49.7)
MCH: 29.6 pg (ref 25.1–34.0)
MCHC: 31.4 g/dL — AB (ref 31.5–36.0)
MCV: 94.3 fL (ref 79.5–101.0)
MONO#: 0 10*3/uL — ABNORMAL LOW (ref 0.1–0.9)
MONO%: 0.8 % (ref 0.0–14.0)
NEUT#: 4.1 10*3/uL (ref 1.5–6.5)
NEUT%: 92.6 % — ABNORMAL HIGH (ref 38.4–76.8)
Platelets: 214 10*3/uL (ref 145–400)
RBC: 3.73 10*6/uL (ref 3.70–5.45)
RDW: 17.5 % — AB (ref 11.2–14.5)
WBC: 4.4 10*3/uL (ref 3.9–10.3)
lymph#: 0.3 10*3/uL — ABNORMAL LOW (ref 0.9–3.3)

## 2014-06-15 LAB — COMPREHENSIVE METABOLIC PANEL (CC13)
ALT: 16 U/L (ref 0–55)
AST: 15 U/L (ref 5–34)
Albumin: 3.3 g/dL — ABNORMAL LOW (ref 3.5–5.0)
Alkaline Phosphatase: 60 U/L (ref 40–150)
Anion Gap: 11 mEq/L (ref 3–11)
BUN: 19.6 mg/dL (ref 7.0–26.0)
CALCIUM: 9.9 mg/dL (ref 8.4–10.4)
CHLORIDE: 101 meq/L (ref 98–109)
CO2: 25 mEq/L (ref 22–29)
Creatinine: 1.3 mg/dL — ABNORMAL HIGH (ref 0.6–1.1)
Glucose: 217 mg/dl — ABNORMAL HIGH (ref 70–140)
Potassium: 3.9 mEq/L (ref 3.5–5.1)
Sodium: 136 mEq/L (ref 136–145)
Total Bilirubin: 0.46 mg/dL (ref 0.20–1.20)
Total Protein: 6.2 g/dL — ABNORMAL LOW (ref 6.4–8.3)

## 2014-06-15 LAB — PROTIME-INR
INR: 2.6 (ref 2.00–3.50)
PROTIME: 31.2 s — AB (ref 10.6–13.4)

## 2014-06-15 LAB — LACTATE DEHYDROGENASE (CC13): LDH: 228 U/L (ref 125–245)

## 2014-06-15 LAB — POCT INR: INR: 2.6

## 2014-06-15 NOTE — Progress Notes (Signed)
06/15/2014  1345  Cycle 3, day 8 [name] Patient in the clinic today for labs and Coumadin clinic.  Met with patient and her daughter, [name], to check in and assess for questions or needs.  Patient reports taking Decadron 40 mg this morning and has continued to take the Lenalidomide as instructed.  Patient and her daughter report no complaints or changes in condition since last week.  Reviewed schedule with patient and her daughter.  They were without questions and verbalized understanding.  Will continue to follow.

## 2014-06-15 NOTE — Progress Notes (Signed)
INR = 2.6    Goal 2-3 INR within goal range. No complications of anticoagulation noted. [name] is doing well with her elotuzumab. She needs a new prescription of Coumadin.   Gave patient and her daughter an RX for Warfarin 5 mg #90, refill x 3. She will continue Coumadin 2.5 mg daily except 5 mg on Thursdays. She will return in 1 week on 06/22/14 for 11:45am lab, Adrena at 12:15 and treatment at 1:45pm.   We will see her in the treatment area.  [name], PharmD

## 2014-06-20 ENCOUNTER — Other Ambulatory Visit: Payer: Medicare Other

## 2014-06-20 ENCOUNTER — Ambulatory Visit: Payer: Medicare Other

## 2014-06-21 ENCOUNTER — Other Ambulatory Visit: Payer: Self-pay | Admitting: *Deleted

## 2014-06-21 ENCOUNTER — Other Ambulatory Visit: Payer: Self-pay | Admitting: Medical Oncology

## 2014-06-21 DIAGNOSIS — C9000 Multiple myeloma not having achieved remission: Secondary | ICD-10-CM

## 2014-06-21 MED ORDER — OXYCODONE HCL 5 MG PO CAPS: 5.0000 mg | ORAL_CAPSULE | Freq: Four times a day (QID) | ORAL | Status: DC | PRN

## 2014-06-21 NOTE — Progress Notes (Signed)
Daughter called requesting refill for oxyir. Rx locked in injection room.

## 2014-06-22 ENCOUNTER — Other Ambulatory Visit (HOSPITAL_BASED_OUTPATIENT_CLINIC_OR_DEPARTMENT_OTHER): Payer: Medicare Other

## 2014-06-22 ENCOUNTER — Encounter: Payer: Self-pay | Admitting: *Deleted

## 2014-06-22 ENCOUNTER — Ambulatory Visit (HOSPITAL_BASED_OUTPATIENT_CLINIC_OR_DEPARTMENT_OTHER): Payer: Medicare Other | Admitting: Physician Assistant

## 2014-06-22 ENCOUNTER — Ambulatory Visit (HOSPITAL_BASED_OUTPATIENT_CLINIC_OR_DEPARTMENT_OTHER): Payer: Medicare Other

## 2014-06-22 ENCOUNTER — Encounter: Payer: Self-pay | Admitting: Physician Assistant

## 2014-06-22 ENCOUNTER — Ambulatory Visit: Payer: Medicare Other | Admitting: Pharmacist

## 2014-06-22 ENCOUNTER — Telehealth: Payer: Self-pay | Admitting: *Deleted

## 2014-06-22 ENCOUNTER — Telehealth: Payer: Self-pay | Admitting: Internal Medicine

## 2014-06-22 VITALS — BP 132/56 | HR 82 | Temp 98.2°F | Resp 19 | Ht <= 58 in | Wt 121.2 lb

## 2014-06-22 DIAGNOSIS — I82401 Acute embolism and thrombosis of unspecified deep veins of right lower extremity: Secondary | ICD-10-CM

## 2014-06-22 DIAGNOSIS — N289 Disorder of kidney and ureter, unspecified: Secondary | ICD-10-CM

## 2014-06-22 DIAGNOSIS — C9002 Multiple myeloma in relapse: Secondary | ICD-10-CM

## 2014-06-22 DIAGNOSIS — C9000 Multiple myeloma not having achieved remission: Secondary | ICD-10-CM

## 2014-06-22 DIAGNOSIS — R944 Abnormal results of kidney function studies: Secondary | ICD-10-CM

## 2014-06-22 DIAGNOSIS — Z7901 Long term (current) use of anticoagulants: Secondary | ICD-10-CM

## 2014-06-22 LAB — PROTIME-INR
INR: 3.7 — AB (ref 2.00–3.50)
Protime: 44.4 Seconds — ABNORMAL HIGH (ref 10.6–13.4)

## 2014-06-22 LAB — CBC WITH DIFFERENTIAL/PLATELET
BASO%: 0.2 % (ref 0.0–2.0)
BASOS ABS: 0 10*3/uL (ref 0.0–0.1)
EOS ABS: 0 10*3/uL (ref 0.0–0.5)
EOS%: 0.8 % (ref 0.0–7.0)
HCT: 33.2 % — ABNORMAL LOW (ref 34.8–46.6)
HEMOGLOBIN: 10.4 g/dL — AB (ref 11.6–15.9)
LYMPH%: 5.7 % — ABNORMAL LOW (ref 14.0–49.7)
MCH: 29.4 pg (ref 25.1–34.0)
MCHC: 31.3 g/dL — ABNORMAL LOW (ref 31.5–36.0)
MCV: 94.1 fL (ref 79.5–101.0)
MONO#: 0.1 10*3/uL (ref 0.1–0.9)
MONO%: 2.2 % (ref 0.0–14.0)
NEUT%: 91.1 % — ABNORMAL HIGH (ref 38.4–76.8)
NEUTROS ABS: 4.6 10*3/uL (ref 1.5–6.5)
Platelets: 183 10*3/uL (ref 145–400)
RBC: 3.53 10*6/uL — ABNORMAL LOW (ref 3.70–5.45)
RDW: 17.4 % — AB (ref 11.2–14.5)
WBC: 5.1 10*3/uL (ref 3.9–10.3)
lymph#: 0.3 10*3/uL — ABNORMAL LOW (ref 0.9–3.3)

## 2014-06-22 LAB — COMPREHENSIVE METABOLIC PANEL (CC13)
ALBUMIN: 3.3 g/dL — AB (ref 3.5–5.0)
ALK PHOS: 52 U/L (ref 40–150)
ALT: 16 U/L (ref 0–55)
ANION GAP: 14 meq/L — AB (ref 3–11)
AST: 14 U/L (ref 5–34)
BUN: 23.9 mg/dL (ref 7.0–26.0)
CALCIUM: 10.6 mg/dL — AB (ref 8.4–10.4)
CHLORIDE: 99 meq/L (ref 98–109)
CO2: 24 meq/L (ref 22–29)
Creatinine: 1.9 mg/dL — ABNORMAL HIGH (ref 0.6–1.1)
GLUCOSE: 247 mg/dL — AB (ref 70–140)
POTASSIUM: 3.5 meq/L (ref 3.5–5.1)
Sodium: 137 mEq/L (ref 136–145)
Total Bilirubin: 0.51 mg/dL (ref 0.20–1.20)
Total Protein: 6 g/dL — ABNORMAL LOW (ref 6.4–8.3)

## 2014-06-22 LAB — POCT INR: INR: 3.7

## 2014-06-22 MED ORDER — SODIUM CHLORIDE 0.9 % IV SOLN
Freq: Once | INTRAVENOUS | Status: AC
  Administered 2014-06-22: 14:00:00 via INTRAVENOUS

## 2014-06-22 NOTE — Progress Notes (Addendum)
[facility] [phone]   [phone]  SHARED VISIT PROGRESS NOTE  Lindsay Stain, MD [place] [address]  DIAGNOSIS:  1. multiple myeloma diagnosed in September 2009,  2. history of bilateral lower extremity deep vein thrombosis diagnosed in November 2009  3. acute on chronic deep vein thrombosis in the left lower extremity diagnosed in October 2010.   PRIOR THERAPY:  1. status post palliative radiotherapy to the right femur and is she'll tuberosity, first was done in December 2011 and the second treatment was completed Jan 08 2011, and the third treatment to the right distal femur completed on 04/30/2011  2. status post palliative radiotherapy to the left proximal femur completed 02/28/2011.  3. Revlimid 25 mg by mouth daily for 21 days every 4 weeks in addition to oral Decadron at 40 mg by mouth on a weekly basis status post 40 cycles.  4. weekly subcutaneous Velcade 1.3 mg/M2 status post 60 cycles, discontinued today secondary to mild disease progression. 5. Pomalyst 4 mg by mouth daily for 21 days every 4 weeks, the patient started the first dose on 12/22/2012 , in addition to Decadron 40 mg weekly. Status post 13 cycles.   CURRENT THERAPY:  1.   Chemotherapy according to the [facility] [id number] expanded access treatment protocol with Elotuzumab lenalidomide and dexamthasone. 2. Coumadin 5 mg on Mondays and Thursdays and 2.5 mg on all other days. 3.  Zometa 4 mg IV given every 3 months.    INTERVAL HISTORY: [name] 78 y.o. female returns to the clinic today for followup visit accompanied by her daughters. She is status post 2 cyclesof treatment with elotuzumab, lenalidomide and dexamethasone. Overall she's tolerating this treatment without difficulty. She does report occasional episodes of diarrhea. This is well managed with Imodium. She is taking her dexamethasone and Revlimid as prescribed.  She is feeling fine today  except for her baseline level of fatigue and weakness. She continues to have the low back pain with radiation to the lower extremity and she is currently on pain medication oxycodone 4 times a day. She denied having any significant nausea or vomiting. The patient denied having any significant weight loss or night sweats. She has no chest pain, shortness of breath, cough or hemoptysis. She accidentally burnt the outside portion of her right thigh on a hot iron. It is healing well.  MEDICAL HISTORY: Past Medical History  Diagnosis Date  . Blood transfusion   . Depression     Mild  . History of chicken pox   . Hyperlipidemia   . Hypertension   . Degenerative joint disease   . Phlebitis   . Multiple myeloma     per Dr. Julien Nordmann, s/p palliative radiation for leg pain 2011 per Dr. Sondra Come  . Deep vein thrombosis of bilateral lower extremities   . Family history of anesthesia complication     DAUGHTER CPR AFTER  . Cardiomyopathy   . Diabetes mellitus     Steroid related  . UTI (urinary tract infection) 09/08/2013  . History of radiation therapy 08/30/13-09/20/13    20 gray to lower lumbar/upper sacrum    ALLERGIES:  has No Known Allergies.  MEDICATIONS:  Current Outpatient Prescriptions  Medication Sig Dispense Refill  . acyclovir (ZOVIRAX) 400 MG tablet TAKE ONE TABLET BY MOUTH TWICE DAILY  60 tablet  0  . ALPRAZolam (XANAX) 0.5 MG tablet Take 1 tablet (0.5 mg total) by mouth 2 (  two) times daily as needed for anxiety.  60 tablet  0  . Alum & Mag Hydroxide-Simeth (MAGIC MOUTHWASH W/LIDOCAINE) SOLN Take 5 mLs by mouth 3 (three) times daily as needed for mouth pain (do not swallow, swish and spit 88ml TID PRN).  120 mL  0  . Calcium Carbonate-Vitamin D 600-400 MG-UNIT per tablet Take 1 tablet by mouth 3 (three) times daily with meals.        . cephALEXin (KEFLEX) 500 MG capsule Take 1 capsule (500 mg total) by mouth 4 (four) times daily.  40 capsule  0  . dexamethasone (DECADRON) 4 MG tablet  Take 1 tablet (4 mg total) by mouth once a week.  100 tablet  0  . dexamethasone (DECADRON) 4 MG tablet Take 10 tablets (40 mg total) on 06/15/14 per research protocol  10 tablet  0  . glipiZIDE (GLUCOTROL XL) 2.5 MG 24 hr tablet Take 2.5 mg by mouth daily with breakfast.      . glucose blood (ONE TOUCH ULTRA TEST) test strip Use as instructed  25 each  6  . Lancets (ONETOUCH ULTRASOFT) lancets Test blood sugar once daily or as directed.  100 each  11  . lenalidomide (REVLIMID) 25 MG capsule Take 1 capsule (25 mg total) by mouth daily. 25mg  daily, repeat every 28 days.  Josem Kaufmann # [id number] Start Date 06/08/14.  Female--not of childbearing potential.  21 capsule  0  . lisinopril-hydrochlorothiazide (PRINZIDE,ZESTORETIC) 20-12.5 MG per tablet Take 1/2 by mouth daily      . loperamide (IMODIUM A-D) 2 MG tablet Take 1 tablet (2 mg total) by mouth 4 (four) times daily as needed for diarrhea or loose stools.      Marland Kitchen oxycodone (OXY-IR) 5 MG capsule Take 1 capsule (5 mg total) by mouth every 6 (six) hours as needed for pain (1 1/2 tablets every 6 hours prn pain).  90 capsule  0  . OxyCODONE (OXYCONTIN) 10 mg T12A 12 hr tablet Take 1 tablet (10 mg total) by mouth every 12 (twelve) hours.  60 tablet  0  . prochlorperazine (COMPAZINE) 10 MG tablet TAKE ONE TABLET BY MOUTH EVERY 6 HOURS AS NEEDED  30 tablet  0  . temazepam (RESTORIL) 30 MG capsule Take 1 capsule (30 mg total) by mouth at bedtime as needed for sleep.  3 capsule  0  . warfarin (COUMADIN) 5 MG tablet Take 2.5-5 mg by mouth daily. Takes 1(5mg ) tablet on mondays, Wednesday. Takes 1/2(2.5mg ) tablet on Tuesday,thursday, Friday, Saturday and sunday      . dextromethorphan-guaiFENesin (MUCINEX DM) 30-600 MG per 12 hr tablet Take 1 tablet by mouth 2 (two) times daily.      Marland Kitchen lisinopril-hydrochlorothiazide (PRINZIDE,ZESTORETIC) 20-12.5 MG per tablet TAKE ONE-HALF TABLET BY MOUTH ONCE DAILY  45 tablet  1  . potassium chloride SA (K-DUR,KLOR-CON) 20 MEQ tablet Take  1 tablet (20 mEq total) by mouth as directed.  30 tablet  0  . predniSONE (DELTASONE) 20 MG tablet Take 20 mg by mouth once a week.       Current Facility-Administered Medications  Medication Dose Route Frequency Provider Last Rate Last Dose  . 0.9 %  sodium chloride infusion   Intravenous Once Carlton Adam, PA-C 500 mL/hr at 06/22/14 1420     Facility-Administered Medications Ordered in Other Visits  Medication Dose Route Frequency Provider Last Rate Last Dose  . Zoledronic Acid (ZOMETA) 4 mg IVPB  4 mg Intravenous Once Curt Bears, MD  SURGICAL HISTORY:  Past Surgical History  Procedure Laterality Date  . Abdominal hysterectomy    . Ankle fracture surgery Left     REVIEW OF SYSTEMS:  Constitutional: positive for fatigue Eyes: negative Ears, nose, mouth, throat, and face: positive for Soft tissue mass left lower mandible, likely a sebaceous cyst per CT on 03/01/2014 Respiratory: negative Cardiovascular: negative Gastrointestinal: negative Genitourinary:negative Integument/breast: positive for skin lesion(s) and burn, right lateral thigh Hematologic/lymphatic: negative Musculoskeletal:positive for back pain Neurological: positive for paresthesia Behavioral/Psych: negative Endocrine: negative Allergic/Immunologic: negative   PHYSICAL EXAMINATION: General appearance: alert, cooperative, fatigued and no distress Head: Normocephalic, without obvious abnormality, atraumatic, Subcutaneous nodule on the left side of the face. Neck: no adenopathy, no JVD, supple, symmetrical, trachea midline and thyroid not enlarged, symmetric, no tenderness/mass/nodules Lymph nodes: Cervical, supraclavicular, and axillary nodes normal. Resp: clear to auscultation bilaterally Back: symmetric, no curvature. ROM normal. No CVA tenderness. Cardio: regular rate and rhythm, S1, S2 normal, no murmur, click, rub or gallop GI: soft, non-tender; bowel sounds normal; no masses,  no  organomegaly Extremities: extremities normal, atraumatic, no cyanosis or edema Neurologic: Alert and oriented X 3, normal strength and tone. Normal symmetric reflexes. Normal coordination and gait Skin: Right lateral thigh, well-healing lateral burn, eschar in place approximately 1-11/2 cm  ECOG PERFORMANCE STATUS: 2 - Symptomatic, <50% confined to bed  Blood pressure 132/56, pulse 82, temperature 98.2 F (36.8 C), temperature source Oral, resp. rate 19, height $RemoveBe'4\' 10"'AApuavfTz$  (1.473 m), weight 121 lb 3.2 oz (54.976 kg), SpO2 100.00%.  LABORATORY DATA: Lab Results  Component Value Date   WBC 5.1 06/22/2014   HGB 10.4* 06/22/2014   HCT 33.2* 06/22/2014   MCV 94.1 06/22/2014   PLT 183 06/22/2014      Chemistry      Component Value Date/Time   NA 137 06/22/2014 1202   NA 141 05/18/2014 1308   K 3.5 06/22/2014 1202   K 3.3* 05/18/2014 1308   CL 97 05/18/2014 1308   CL 107 02/14/2013 1311   CO2 24 06/22/2014 1202   CO2 30 05/18/2014 1308   BUN 23.9 06/22/2014 1202   BUN 14 05/18/2014 1308   CREATININE 1.9* 06/22/2014 1202   CREATININE 0.99 05/18/2014 1308      Component Value Date/Time   CALCIUM 10.6* 06/22/2014 1202   CALCIUM 10.7* 05/18/2014 1308   CALCIUM  Value: 5.7 CRITICAL RESULT CALLED TO, READ BACK BY AND VERIFIED WITH: [place] [id number] @ [address] (NOTE)  Amended report. Result repeated and verified. CORRECTED ON 10/14 AT 1429: PREVIOUSLY REPORTED AS Result repeated and verified.* 06/13/2008 1605   ALKPHOS 52 06/22/2014 1202   ALKPHOS 54 05/18/2014 1308   AST 14 06/22/2014 1202   AST 20 05/18/2014 1308   ALT 16 06/22/2014 1202   ALT 17 05/18/2014 1308   BILITOT 0.51 06/22/2014 1202   BILITOT 0.4 05/18/2014 1308     Other lab results: Beta-2 microglobulin 3.93, free kappa light chain 0.79, free lambda light chain 60.60 and kappa/lambda ratio 0.01. IgG 527, IgA 103 and IgM 200.  RADIOGRAPHIC STUDIES: No results found.  ASSESSMENT AND PLAN: This is a very pleasant 78  years old African American female with relapsed multiple myeloma currently treatment with Pomalyst 4 mg by mouth daily for 21 days every 4 weeks status post 13 cycles. The patient is tolerated her treatment fairly well.  Her recent myeloma panel showed evidence for disease progression.  Treatment with Pomalyst was discontinued. She is currently enrollmed  in a [name] clinical trial with treatment with Elotuzumab, Revlimid and Decadron. She is status post 2 cycles  and overall is tolerating this treatment without difficulty. The patient was discussed with and also seen by Dr. Julien Nordmann. Her creatinine today is 1.9. We will hold cycle #3 today as a result. She'll be given 1 L of normal saline today. She will followup She will continue with labs and chemotherapy as scheduled. She'll followup in 2 weeks for another symptom management visit. We'll plan to repeat protein studies/myeloma panel to reevaluate her disease after she has completed 3 cycles. For pain management, she would continue on oxycodone on as-needed basis. She is given a refill prescription for her oxycodone tablets a day. The patient was advised to call immediately if she has any concerning symptoms in the interval. The patient voices understanding of current disease status and treatment options and is in agreement with the current care plan.  All questions were answered. The patient knows to call the clinic with any problems, questions or concerns. We can certainly see the patient much sooner if necessary.  No problem-specific assessment & plan notes found for this encounter.  Disclaimer: This note was dictated with voice recognition software. Similar sounding words can inadvertently be transcribed and may not be corrected upon review. Carlton Adam, PA-C 06/22/2014    ADDENDUM:  Hematology/Oncology Attending:  I had a face to face encounter with the patient. I recommended her care plan. This is a very pleasant 78 years old [place]  American female with history of multiple myeloma diagnosed in September of 2009 status post several chemotherapy regimens and she is currently on treatment with Elotuzumab, Revlimid and Decadron status post 2 cycles. The patient is tolerating her treatment well with no significant complaints. Her serum creatinine is elevated today. I would hold her treatment for this week until Improvement of her renal function. She will resume her treatment next week if she has improvement in her condition. For the time being I will arrange for the patient to received 1 L of normal saline today. She would come back for followup visit in 2 weeks for evaluation and management any adverse effect of her treatment. She was advised to call immediately if she has any concerning symptoms in the interval.  Disclaimer: This note was dictated with voice recognition software. Similar sounding words can inadvertently be transcribed and may be missed upon review. Eilleen Kempf., MD 06/27/2014

## 2014-06-22 NOTE — Telephone Encounter (Signed)
Per POF I have scheduled appts. Patietn has aclaendar

## 2014-06-22 NOTE — Patient Instructions (Signed)
INR above goal No coumadin tonight then Take Coumadin 2.5 mg daily.  Recheck PT/INR on 06/29/14 at 11:45am for lab and 12 noon for coumadin clinic

## 2014-06-22 NOTE — Telephone Encounter (Signed)
gv and printed appt sched and avs for pt for OCT and NOV....sed added tx. °

## 2014-06-22 NOTE — Progress Notes (Signed)
INR above goal Pt seen in infusion area No chemotherapy given, only IV fluids today [name] is doing well with no complaints No unusual bleeding or bruising No missed or extra doses No diet changes Unsure why INR increased today Pt finished Keflex rx 2 weeks ago for possible skin infection around her foot.  This has improved. Plan: No coumadin tonight then Take Coumadin 2.5 mg daily (slight decrease).  Recheck PT/INR in 1 week on 06/29/14 at 11:45am for lab and 12 noon for coumadin clinic

## 2014-06-22 NOTE — Progress Notes (Signed)
06/22/2014  1300 Cycle 3, day 15 [name]  Patient in the clinic today for labs, MD visit, and treatment.  Met with patient and her daughters, [name] and [name], to check in and assess for toxicity,  questions or needs. Patient reports taking Decadron 40 mg this morning at 0800 and has continued to take the Lenalidomide as instructed for the 21 day cycle. Patient reports decreased appetite and sl. Increase in pain since last visit.  She was seen by PA and MD today.  MD decided to hold treatment with Elotuzumab and  Lenalidomide due to increased creatinine and calcium level.  Per Dr. Julien Nordmann, "continue Dexamethasone as scheduled for cycle 3, but hold the Elotuzumab and give patient fluids today.  Have patient stop taking Lenalidomide".  Dr. Julien Nordmann stated he would see patient on 07/06/14 and decide on whether or not to continue with treatment at that time.   The above was explained to the patient and her daughters.   Reviewed schedule with patient and her daughters. Provided daughter [name] and the patient a copy of the new medication calendar re: above changes.  Patient and both daughters verbalized comprehension of instructions, denied questions, and acknowledged they would call if questions arise re: above information.

## 2014-06-23 NOTE — Progress Notes (Unsigned)
Correction to documentation from 06/22/14.  Patient only took 28 mg Decadron by mouth prior to coming in to [facility].

## 2014-06-25 NOTE — Patient Instructions (Signed)
Your kidney function is elevated and we'll have to hold her chemotherapy today. She will be given some IV fluids today Followup in 2 weeks

## 2014-06-26 ENCOUNTER — Other Ambulatory Visit: Payer: Self-pay | Admitting: Medical Oncology

## 2014-06-26 ENCOUNTER — Other Ambulatory Visit: Payer: Self-pay | Admitting: *Deleted

## 2014-06-26 NOTE — Telephone Encounter (Signed)
THIS REFILL REQUEST FOR REVLIMID WAS GIVEN TO DR.MOHAMED'S NURSE, DIANE BELL,RN.

## 2014-06-27 ENCOUNTER — Other Ambulatory Visit: Payer: Self-pay | Admitting: Medical Oncology

## 2014-06-27 ENCOUNTER — Telehealth: Payer: Self-pay | Admitting: Medical Oncology

## 2014-06-27 DIAGNOSIS — C9000 Multiple myeloma not having achieved remission: Secondary | ICD-10-CM

## 2014-06-27 MED ORDER — DEXAMETHASONE 4 MG PO TABS: ORAL_TABLET | ORAL | Status: DC

## 2014-06-27 NOTE — Telephone Encounter (Signed)
Decadron rx called in

## 2014-06-28 ENCOUNTER — Other Ambulatory Visit: Payer: Self-pay | Admitting: *Deleted

## 2014-06-28 NOTE — Telephone Encounter (Signed)
Refill request to collaborative nurse desk to hold for her 07/06/14 visit w/MD. Revlimid on hold since 10/22 due to elevated creatinine. Notified [facility].

## 2014-06-29 ENCOUNTER — Other Ambulatory Visit (HOSPITAL_BASED_OUTPATIENT_CLINIC_OR_DEPARTMENT_OTHER): Payer: Medicare Other

## 2014-06-29 ENCOUNTER — Other Ambulatory Visit: Payer: Self-pay | Admitting: *Deleted

## 2014-06-29 ENCOUNTER — Ambulatory Visit (HOSPITAL_BASED_OUTPATIENT_CLINIC_OR_DEPARTMENT_OTHER): Payer: Self-pay | Admitting: Pharmacist

## 2014-06-29 ENCOUNTER — Other Ambulatory Visit: Payer: Self-pay | Admitting: Medical Oncology

## 2014-06-29 DIAGNOSIS — I82401 Acute embolism and thrombosis of unspecified deep veins of right lower extremity: Secondary | ICD-10-CM

## 2014-06-29 DIAGNOSIS — Z7901 Long term (current) use of anticoagulants: Secondary | ICD-10-CM

## 2014-06-29 DIAGNOSIS — I82403 Acute embolism and thrombosis of unspecified deep veins of lower extremity, bilateral: Secondary | ICD-10-CM

## 2014-06-29 DIAGNOSIS — C9000 Multiple myeloma not having achieved remission: Secondary | ICD-10-CM

## 2014-06-29 DIAGNOSIS — C9002 Multiple myeloma in relapse: Secondary | ICD-10-CM

## 2014-06-29 LAB — PROTIME-INR
INR: 2.7 (ref 2.00–3.50)
Protime: 32.4 Seconds — ABNORMAL HIGH (ref 10.6–13.4)

## 2014-06-29 LAB — CBC WITH DIFFERENTIAL/PLATELET
BASO%: 0.4 % (ref 0.0–2.0)
BASOS ABS: 0 10*3/uL (ref 0.0–0.1)
EOS%: 0.1 % (ref 0.0–7.0)
Eosinophils Absolute: 0 10*3/uL (ref 0.0–0.5)
HCT: 31.1 % — ABNORMAL LOW (ref 34.8–46.6)
HEMOGLOBIN: 10 g/dL — AB (ref 11.6–15.9)
LYMPH#: 0.1 10*3/uL — AB (ref 0.9–3.3)
LYMPH%: 3.9 % — ABNORMAL LOW (ref 14.0–49.7)
MCH: 30.1 pg (ref 25.1–34.0)
MCHC: 32.1 g/dL (ref 31.5–36.0)
MCV: 93.5 fL (ref 79.5–101.0)
MONO#: 0.1 10*3/uL (ref 0.1–0.9)
MONO%: 3.3 % (ref 0.0–14.0)
NEUT%: 92.3 % — ABNORMAL HIGH (ref 38.4–76.8)
NEUTROS ABS: 3 10*3/uL (ref 1.5–6.5)
Platelets: 191 10*3/uL (ref 145–400)
RBC: 3.32 10*6/uL — ABNORMAL LOW (ref 3.70–5.45)
RDW: 17.6 % — AB (ref 11.2–14.5)
WBC: 3.3 10*3/uL — ABNORMAL LOW (ref 3.9–10.3)

## 2014-06-29 LAB — COMPREHENSIVE METABOLIC PANEL (CC13)
ALT: 12 U/L (ref 0–55)
AST: 14 U/L (ref 5–34)
Albumin: 3.3 g/dL — ABNORMAL LOW (ref 3.5–5.0)
Alkaline Phosphatase: 57 U/L (ref 40–150)
Anion Gap: 9 mEq/L (ref 3–11)
BUN: 18.9 mg/dL (ref 7.0–26.0)
CALCIUM: 9.7 mg/dL (ref 8.4–10.4)
CHLORIDE: 105 meq/L (ref 98–109)
CO2: 27 mEq/L (ref 22–29)
Creatinine: 1.1 mg/dL (ref 0.6–1.1)
Glucose: 191 mg/dl — ABNORMAL HIGH (ref 70–140)
POTASSIUM: 3.1 meq/L — AB (ref 3.5–5.1)
Sodium: 141 mEq/L (ref 136–145)
Total Bilirubin: 0.34 mg/dL (ref 0.20–1.20)
Total Protein: 6.2 g/dL — ABNORMAL LOW (ref 6.4–8.3)

## 2014-06-29 LAB — POCT INR: INR: 2.7

## 2014-06-29 MED ORDER — MAGIC MOUTHWASH W/LIDOCAINE: 5.0000 mL | Freq: Three times a day (TID) | ORAL | Status: DC | PRN

## 2014-06-29 NOTE — Progress Notes (Signed)
INR within goal today. Hg/Hct: 10/31.1, Pltc: 191 Pt took coumadin as instructed. No changes in diet or medications. No unusual bruising. No bleeding noted. No s/s of clotting. Pt has developed some sores on her forehead. Daughters have been putting Neosporin on them and they have improved. Pt to see Althea Grimmer today. Pt also requests a refill on her Magic Mouthwash. Marcie Bal will call in a refill for patient. Will continue to take Coumadin 2.5mg  daily.  Recheck PT/INR on 07/06/14; Lab at 8:30am, Dr. Julien Nordmann at [facility], treatment at 9:45am and Coumadin clinic at 10am.

## 2014-06-29 NOTE — Telephone Encounter (Signed)
PT. HAS HAD A FINE RASH ON HER FACE SINCE EARLY THIS WEEK. HER DAUGHTER HAS BEEN USING NEOSPORIN OINTMENT WHICH HAS HELPED. NO SHORTNESS OF BREATH NOTED. VERBAL ORDER AND READ BACK TO CINDEE BACON,NP- PT. MAY USE BENADRYL 25MG  EVERY SIX HOURS PRN AND PEPCID 20MG  EVERY TWELVE HOURS PRN. CALL THIS OFFICE TOMORROW WITH AN UPDATE. INFORMED AND GAVE WRITTEN INSTRUCTIONS TO PT.'S DAUGHTER. ALSO CAUTIONED POSSIBLE DROWSINESS WITH THE BENADRYL AND IF PT. BECOMES SHORT OF BREATH TO GO TO THE EMERGENCY ROOM. PT.'S DAUGHTER VOICES UNDERSTANDING.

## 2014-06-29 NOTE — Progress Notes (Signed)
Quick Note:  Call patient with the result and order K Dur 20 meq po qd X 7 days ______

## 2014-06-29 NOTE — Patient Instructions (Signed)
Continue to take Coumadin 2.5mg  daily.  Recheck PT/INR on 07/06/14; Lab at 8:30am, Dr. Julien Nordmann at [facility], treatment at 9:45am and Coumadin clinic at 10am.   We will see in the infusion area.

## 2014-06-30 ENCOUNTER — Other Ambulatory Visit: Payer: Self-pay | Admitting: Medical Oncology

## 2014-06-30 ENCOUNTER — Telehealth: Payer: Self-pay | Admitting: Medical Oncology

## 2014-06-30 ENCOUNTER — Encounter: Payer: Self-pay | Admitting: *Deleted

## 2014-06-30 DIAGNOSIS — E876 Hypokalemia: Secondary | ICD-10-CM

## 2014-06-30 DIAGNOSIS — C9000 Multiple myeloma not having achieved remission: Secondary | ICD-10-CM

## 2014-06-30 MED ORDER — LENALIDOMIDE 25 MG PO CAPS: 25.0000 mg | ORAL_CAPSULE | Freq: Every day | ORAL | Status: DC

## 2014-06-30 MED ORDER — POTASSIUM CHLORIDE CRYS ER 20 MEQ PO TBCR: 20.0000 meq | EXTENDED_RELEASE_TABLET | Freq: Every day | ORAL | Status: DC

## 2014-06-30 NOTE — Progress Notes (Unsigned)
06/29/14 @ 12:00 noon, BMS [id number], Cycle 3, Day 22:   Mrs. Harrigan, accompanied by her daughter, into the [facility] for labs only.  Spoke with her in the lobby and she and her daughter confirmed that she took dexamethasone 40 mg by mouth this morning with food.  Confirmed that they have and understand the calendars that Marcellus Scott, RN prepared for them.  Reminded her that she will receive a shipment of lenalidomide next week, but she is not to start it until Day 1 of Cycle 4.  They confirmed that they have picked the new supply of oral dexamethasone. The only concern that was expressed was over a slight rash her mother has on her forehead, which was not visible to me, for which she is using Neosporin ointment.  She will discuss this with Dr. Worthy Flank nurse.  Provided Dr. Julien Nordmann a paper copy of all of [name] labs today along with a request to refill her lenalidomide.  06/30/14 @ 1:30 pm, lenalidomide refill:  Received a call from Abelina Bachelor, RN with Dr. Julien Nordmann saying Mrs. Shilling lenalidomide has been reordered at the same dose, 25 mg for days 1-21 for the next cycle.

## 2014-06-30 NOTE — Telephone Encounter (Signed)
Ronnie notifed

## 2014-06-30 NOTE — Telephone Encounter (Signed)
Message copied by Ardeen Garland on Fri Jun 30, 2014 11:13 AM ------      Message from: Curt Bears      Created: Thu Jun 29, 2014  6:54 PM       Call patient with the result and order K Dur 20 meq po qd X 7 days ------

## 2014-07-03 ENCOUNTER — Other Ambulatory Visit: Payer: Self-pay | Admitting: Physician Assistant

## 2014-07-03 DIAGNOSIS — C9000 Multiple myeloma not having achieved remission: Secondary | ICD-10-CM

## 2014-07-03 NOTE — Progress Notes (Signed)
RECEIVED A FAX FROM [facility] CONCERNING A CONFIRMATION OF PRESCRIPTION SHIPMENT FOR REVLIMID ON 06/30/14.

## 2014-07-04 ENCOUNTER — Other Ambulatory Visit: Payer: Self-pay | Admitting: *Deleted

## 2014-07-04 DIAGNOSIS — C9000 Multiple myeloma not having achieved remission: Secondary | ICD-10-CM

## 2014-07-04 MED ORDER — ALPRAZOLAM 0.5 MG PO TABS: 0.5000 mg | ORAL_TABLET | Freq: Two times a day (BID) | ORAL | Status: DC | PRN

## 2014-07-05 ENCOUNTER — Other Ambulatory Visit: Payer: Self-pay | Admitting: *Deleted

## 2014-07-06 ENCOUNTER — Telehealth: Payer: Self-pay | Admitting: Internal Medicine

## 2014-07-06 ENCOUNTER — Ambulatory Visit: Payer: Medicare Other | Admitting: Pharmacist

## 2014-07-06 ENCOUNTER — Ambulatory Visit (HOSPITAL_BASED_OUTPATIENT_CLINIC_OR_DEPARTMENT_OTHER): Payer: Medicare Other

## 2014-07-06 ENCOUNTER — Encounter: Payer: Self-pay | Admitting: Internal Medicine

## 2014-07-06 ENCOUNTER — Other Ambulatory Visit (HOSPITAL_BASED_OUTPATIENT_CLINIC_OR_DEPARTMENT_OTHER): Payer: Medicare Other

## 2014-07-06 ENCOUNTER — Ambulatory Visit (INDEPENDENT_AMBULATORY_CARE_PROVIDER_SITE_OTHER): Payer: Medicare Other | Admitting: Family Medicine

## 2014-07-06 ENCOUNTER — Ambulatory Visit: Payer: Medicare Other

## 2014-07-06 ENCOUNTER — Ambulatory Visit (HOSPITAL_BASED_OUTPATIENT_CLINIC_OR_DEPARTMENT_OTHER): Payer: Medicare Other | Admitting: Internal Medicine

## 2014-07-06 ENCOUNTER — Encounter: Payer: Self-pay | Admitting: *Deleted

## 2014-07-06 ENCOUNTER — Encounter: Payer: Self-pay | Admitting: Family Medicine

## 2014-07-06 VITALS — BP 144/51 | HR 53 | Temp 98.2°F | Resp 18 | Ht <= 58 in | Wt 120.5 lb

## 2014-07-06 VITALS — BP 142/64 | HR 56 | Temp 98.4°F | Wt 124.0 lb

## 2014-07-06 DIAGNOSIS — C9002 Multiple myeloma in relapse: Secondary | ICD-10-CM

## 2014-07-06 DIAGNOSIS — Z86718 Personal history of other venous thrombosis and embolism: Secondary | ICD-10-CM

## 2014-07-06 DIAGNOSIS — E119 Type 2 diabetes mellitus without complications: Secondary | ICD-10-CM

## 2014-07-06 DIAGNOSIS — C9000 Multiple myeloma not having achieved remission: Secondary | ICD-10-CM

## 2014-07-06 DIAGNOSIS — Z5112 Encounter for antineoplastic immunotherapy: Secondary | ICD-10-CM

## 2014-07-06 DIAGNOSIS — Z7901 Long term (current) use of anticoagulants: Secondary | ICD-10-CM

## 2014-07-06 DIAGNOSIS — I82401 Acute embolism and thrombosis of unspecified deep veins of right lower extremity: Secondary | ICD-10-CM

## 2014-07-06 DIAGNOSIS — Z006 Encounter for examination for normal comparison and control in clinical research program: Secondary | ICD-10-CM

## 2014-07-06 LAB — CBC WITH DIFFERENTIAL/PLATELET
BASO%: 0.2 % (ref 0.0–2.0)
Basophils Absolute: 0 10*3/uL (ref 0.0–0.1)
EOS%: 0 % (ref 0.0–7.0)
Eosinophils Absolute: 0 10*3/uL (ref 0.0–0.5)
HCT: 30.4 % — ABNORMAL LOW (ref 34.8–46.6)
HGB: 9.9 g/dL — ABNORMAL LOW (ref 11.6–15.9)
LYMPH%: 5 % — AB (ref 14.0–49.7)
MCH: 30 pg (ref 25.1–34.0)
MCHC: 32.6 g/dL (ref 31.5–36.0)
MCV: 92.1 fL (ref 79.5–101.0)
MONO#: 0.2 10*3/uL (ref 0.1–0.9)
MONO%: 4.8 % (ref 0.0–14.0)
NEUT#: 4.2 10*3/uL (ref 1.5–6.5)
NEUT%: 90 % — ABNORMAL HIGH (ref 38.4–76.8)
PLATELETS: 222 10*3/uL (ref 145–400)
RBC: 3.3 10*6/uL — AB (ref 3.70–5.45)
RDW: 16.2 % — ABNORMAL HIGH (ref 11.2–14.5)
WBC: 4.6 10*3/uL (ref 3.9–10.3)
lymph#: 0.2 10*3/uL — ABNORMAL LOW (ref 0.9–3.3)
nRBC: 0 % (ref 0–0)

## 2014-07-06 LAB — LACTATE DEHYDROGENASE (CC13): LDH: 273 U/L — ABNORMAL HIGH (ref 125–245)

## 2014-07-06 LAB — COMPREHENSIVE METABOLIC PANEL (CC13)
ALT: 14 U/L (ref 0–55)
AST: 13 U/L (ref 5–34)
Albumin: 3.3 g/dL — ABNORMAL LOW (ref 3.5–5.0)
Alkaline Phosphatase: 63 U/L (ref 40–150)
Anion Gap: 13 mEq/L — ABNORMAL HIGH (ref 3–11)
BUN: 18.7 mg/dL (ref 7.0–26.0)
CALCIUM: 9.8 mg/dL (ref 8.4–10.4)
CHLORIDE: 103 meq/L (ref 98–109)
CO2: 25 mEq/L (ref 22–29)
Creatinine: 0.9 mg/dL (ref 0.6–1.1)
Glucose: 204 mg/dl — ABNORMAL HIGH (ref 70–140)
Potassium: 3.7 mEq/L (ref 3.5–5.1)
Sodium: 141 mEq/L (ref 136–145)
Total Bilirubin: 0.26 mg/dL (ref 0.20–1.20)
Total Protein: 6.3 g/dL — ABNORMAL LOW (ref 6.4–8.3)

## 2014-07-06 LAB — PROTIME-INR
INR: 1.6 — ABNORMAL LOW (ref 2.00–3.50)
Protime: 19.2 Seconds — ABNORMAL HIGH (ref 10.6–13.4)

## 2014-07-06 LAB — POCT INR: INR: 1.6

## 2014-07-06 MED ORDER — DEXAMETHASONE SODIUM PHOSPHATE 10 MG/ML IJ SOLN
8.0000 mg | Freq: Once | INTRAMUSCULAR | Status: AC
  Administered 2014-07-06: 8 mg via INTRAVENOUS

## 2014-07-06 MED ORDER — OXYCODONE HCL 5 MG PO CAPS: 5.0000 mg | ORAL_CAPSULE | Freq: Four times a day (QID) | ORAL | Status: DC | PRN

## 2014-07-06 MED ORDER — ACETAMINOPHEN 325 MG PO TABS
ORAL_TABLET | ORAL | Status: AC
  Filled 2014-07-06: qty 2

## 2014-07-06 MED ORDER — DEXAMETHASONE 4 MG PO TABS: ORAL_TABLET | ORAL | Status: DC

## 2014-07-06 MED ORDER — SODIUM CHLORIDE 0.9 % IV SOLN
Freq: Once | INTRAVENOUS | Status: AC
  Administered 2014-07-06: 10:00:00 via INTRAVENOUS

## 2014-07-06 MED ORDER — FAMOTIDINE IN NACL 20-0.9 MG/50ML-% IV SOLN
20.0000 mg | Freq: Once | INTRAVENOUS | Status: AC
  Administered 2014-07-06: 20 mg via INTRAVENOUS

## 2014-07-06 MED ORDER — DIPHENHYDRAMINE HCL 25 MG PO CAPS
ORAL_CAPSULE | ORAL | Status: AC
  Filled 2014-07-06: qty 2

## 2014-07-06 MED ORDER — ZOLEDRONIC ACID 4 MG/5ML IV CONC: 4.0000 mg | Freq: Once | INTRAVENOUS | Status: DC

## 2014-07-06 MED ORDER — ACETAMINOPHEN 325 MG PO TABS
650.0000 mg | ORAL_TABLET | Freq: Once | ORAL | Status: AC
  Administered 2014-07-06: 650 mg via ORAL

## 2014-07-06 MED ORDER — FAMOTIDINE IN NACL 20-0.9 MG/50ML-% IV SOLN
INTRAVENOUS | Status: AC
  Filled 2014-07-06: qty 50

## 2014-07-06 MED ORDER — SODIUM CHLORIDE 0.9 % IV SOLN
10.0000 mg/kg | Freq: Once | INTRAVENOUS | Status: AC
  Administered 2014-07-06: 545 mg via INTRAVENOUS
  Filled 2014-07-06: qty 21.8

## 2014-07-06 MED ORDER — DEXAMETHASONE SODIUM PHOSPHATE 10 MG/ML IJ SOLN
INTRAMUSCULAR | Status: AC
Start: 2014-07-06 — End: 2014-07-06
  Filled 2014-07-06: qty 1

## 2014-07-06 MED ORDER — DIPHENHYDRAMINE HCL 50 MG/ML IJ SOLN
INTRAMUSCULAR | Status: AC
  Filled 2014-07-06: qty 1

## 2014-07-06 MED ORDER — DIPHENHYDRAMINE HCL 25 MG PO CAPS
50.0000 mg | ORAL_CAPSULE | Freq: Once | ORAL | Status: AC
  Administered 2014-07-06: 50 mg via ORAL

## 2014-07-06 NOTE — Telephone Encounter (Signed)
gv adn printed appt sched and avs for opt for NOV adn Dec

## 2014-07-06 NOTE — Patient Instructions (Signed)
INR below goal Increase coumadin to 2.5mg  daily except 5 mg on Thursdays.  Recheck PT/INR on 07/20/14; Lab at 9:45am,  We will see in the infusion area.

## 2014-07-06 NOTE — Progress Notes (Signed)
[place] [phone]   [phone]  OFFICE PROGRESS NOTE  Lindsay Stain, MD [place] [address]  DIAGNOSIS:  1. Multiple myeloma diagnosed in September 2009,  2. history of bilateral lower extremity deep vein thrombosis diagnosed in November 2009  3. acute on chronic deep vein thrombosis in the left lower extremity diagnosed in October 2010.   PRIOR THERAPY:  1. status post palliative radiotherapy to the right femur and ischail tuberosity, first was done in December 2011 and the second treatment was completed Jan 08 2011, and the third treatment to the right distal femur completed on 04/30/2011  2. status post palliative radiotherapy to the left proximal femur completed 02/28/2011.  3. Revlimid 25 mg by mouth daily for 21 days every 4 weeks in addition to oral Decadron at 40 mg by mouth on a weekly basis status post 40 cycles.  4. weekly subcutaneous Velcade 1.3 mg/M2 status post 60 cycles, discontinued today secondary to mild disease progression. 5. Pomalyst 4 mg by mouth daily for 21 days every 4 weeks, the patient started the first dose on 12/22/2012 , in addition to Decadron 40 mg weekly. Status post 13 cycles.   CURRENT THERAPY:  1. Chemotherapy according to the [place] [id number] expanded access treatment protocol with Elotuzumab, lenalidomide and dexamthasone. Status post 3 cycles. 2. Coumadin 5 mg on Mondays and Thursdays and 2.5 mg on all other days. 3. Zometa 4 mg IV given every 3 months.  INTERVAL HISTORY: [name] 78 y.o. female returns to the clinic today for follow-up visit accompanied by her daughter. The patient is tolerating her current treatment fairly well with no significant adverse effect except for the persistent low back pain and peripheral neuropathy in the lower extremities. She denied having any significant weight loss or night sweats. She has no nausea or vomiting. The patient denied having  any significant chest pain, shortness of breath, cough or hemoptysis. She is here today to start cycle #4 of her systemic therapy.  MEDICAL HISTORY: Past Medical History  Diagnosis Date  . Blood transfusion   . Depression     Mild  . History of chicken pox   . Hyperlipidemia   . Hypertension   . Degenerative joint disease   . Phlebitis   . Multiple myeloma     per Dr. Julien Nordmann, s/p palliative radiation for leg pain 2011 per Dr. Sondra Come  . Deep vein thrombosis of bilateral lower extremities   . Family history of anesthesia complication     DAUGHTER CPR AFTER  . Cardiomyopathy   . Diabetes mellitus     Steroid related  . UTI (urinary tract infection) 09/08/2013  . History of radiation therapy 08/30/13-09/20/13    20 gray to lower lumbar/upper sacrum    ALLERGIES:  has No Known Allergies.  MEDICATIONS:  Current Outpatient Prescriptions  Medication Sig Dispense Refill  . acyclovir (ZOVIRAX) 400 MG tablet TAKE ONE TABLET BY MOUTH TWICE DAILY 60 tablet 0  . ALPRAZolam (XANAX) 0.5 MG tablet Take 1 tablet (0.5 mg total) by mouth 2 (two) times daily as needed for anxiety. 60 tablet 0  . Alum & Mag Hydroxide-Simeth (MAGIC MOUTHWASH W/LIDOCAINE) SOLN Take 5 mLs by mouth 3 (three) times daily as needed for mouth pain (do not swallow, swish and spit 38m TID PRN). 120 mL 0  . Calcium Carbonate-Vitamin D 600-400 MG-UNIT per tablet Take 1 tablet by mouth 3 (three) times daily with meals.      .Marland Kitchen  dexamethasone (DECADRON) 4 MG tablet Take 10 tab (40 mg) weekly except on days when you receive elotuzumab. On those days take 7 tab (28 mg) between 3 & 24 hours prior to the start of elotuzumab. 58 tablet 3  . dextromethorphan-guaiFENesin (MUCINEX DM) 30-600 MG per 12 hr tablet Take 1 tablet by mouth 2 (two) times daily.    . diphenhydrAMINE (SOMINEX) 25 MG tablet Take 25 mg by mouth at bedtime as needed for sleep.    Marland Kitchen glipiZIDE (GLUCOTROL XL) 2.5 MG 24 hr tablet Take 2.5 mg by mouth daily with breakfast.     . glucose blood (ONE TOUCH ULTRA TEST) test strip Use as instructed 25 each 6  . Lancets (ONETOUCH ULTRASOFT) lancets Test blood sugar once daily or as directed. 100 each 11  . lenalidomide (REVLIMID) 25 MG capsule Take 1 capsule (25 mg total) by mouth daily. 24m daily, repeat every 28 days.  aJosem Kaufmann# [id number] Date 10/30/[birth year].  Female--not of childbearing potential. 21 capsule 0  . lisinopril-hydrochlorothiazide (PRINZIDE,ZESTORETIC) 20-12.5 MG per tablet Take 1/2 by mouth daily    . lisinopril-hydrochlorothiazide (PRINZIDE,ZESTORETIC) 20-12.5 MG per tablet TAKE ONE-HALF TABLET BY MOUTH ONCE DAILY 45 tablet 1  . loperamide (IMODIUM A-D) 2 MG tablet Take 1 tablet (2 mg total) by mouth 4 (four) times daily as needed for diarrhea or loose stools.    .Marland Kitchenoxycodone (OXY-IR) 5 MG capsule Take 1 capsule (5 mg total) by mouth every 6 (six) hours as needed for pain (1 1/2 tablets every 6 hours prn pain). 90 capsule 0  . OxyCODONE (OXYCONTIN) 10 mg T12A 12 hr tablet Take 1 tablet (10 mg total) by mouth every 12 (twelve) hours. 60 tablet 0  . potassium chloride SA (K-DUR,KLOR-CON) 20 MEQ tablet Take 1 tablet (20 mEq total) by mouth as directed. 30 tablet 0  . potassium chloride SA (K-DUR,KLOR-CON) 20 MEQ tablet Take 1 tablet (20 mEq total) by mouth daily. 7 tablet 0  . predniSONE (DELTASONE) 20 MG tablet Take 20 mg by mouth once a week.    . prochlorperazine (COMPAZINE) 10 MG tablet TAKE ONE TABLET BY MOUTH EVERY 6 HOURS AS NEEDED 30 tablet 0  . temazepam (RESTORIL) 30 MG capsule Take 1 capsule (30 mg total) by mouth at bedtime as needed for sleep. 3 capsule 0  . warfarin (COUMADIN) 5 MG tablet Take 2.5-5 mg by mouth daily. Takes 1(5226m tablet on mondays, Wednesday. Takes 1/2(2.26m26mtablet on Tuesday,thursday, Friday, Saturday and sunday     No current facility-administered medications for this visit.   Facility-Administered Medications Ordered in Other Visits  Medication Dose Route Frequency Provider  Last Rate Last Dose  . Zoledronic Acid (ZOMETA) 4 mg IVPB  4 mg Intravenous Once MohCurt BearsD        SURGICAL HISTORY:  Past Surgical History  Procedure Laterality Date  . Abdominal hysterectomy    . Ankle fracture surgery Left     REVIEW OF SYSTEMS:  A comprehensive review of systems was negative except for: Musculoskeletal: positive for back pain   PHYSICAL EXAMINATION: General appearance: alert, cooperative and no distress Head: Normocephalic, without obvious abnormality, atraumatic Neck: no adenopathy, no JVD, supple, symmetrical, trachea midline and thyroid not enlarged, symmetric, no tenderness/mass/nodules Lymph nodes: Cervical, supraclavicular, and axillary nodes normal. Resp: clear to auscultation bilaterally Back: symmetric, no curvature. ROM normal. No CVA tenderness. Cardio: regular rate and rhythm, S1, S2 normal, no murmur, click, rub or gallop GI: soft, non-tender; bowel sounds normal; no masses,  no organomegaly Extremities: extremities normal, atraumatic, no cyanosis or edema  ECOG PERFORMANCE STATUS: 1 - Symptomatic but completely ambulatory  Blood pressure 144/51, pulse 53, temperature 98.2 F (36.8 C), temperature source Oral, resp. rate 18, height _0  (1.473 m), weight 120 lb 8 oz (54.658 kg), SpO2 100 %.  LABORATORY DATA: Lab Results  Component Value Date   WBC 4.6 07/06/2014   HGB 9.9* 07/06/2014   HCT 30.4* 07/06/2014   MCV 92.1 07/06/2014   PLT 222 07/06/2014      Chemistry      Component Value Date/Time   NA 141 06/29/2014 1227   NA 141 05/18/2014 1308   K 3.1* 06/29/2014 1227   K 3.3* 05/18/2014 1308   CL 97 05/18/2014 1308   CL 107 02/14/2013 1311   CO2 27 06/29/2014 1227   CO2 30 05/18/2014 1308   BUN 18.9 06/29/2014 1227   BUN 14 05/18/2014 1308   CREATININE 1.1 06/29/2014 1227   CREATININE 0.99 05/18/2014 1308      Component Value Date/Time   CALCIUM 9.7 06/29/2014 1227   CALCIUM 10.7* 05/18/2014 1308   CALCIUM *  06/13/2008 1605    5.7 CRITICAL RESULT CALLED TO, READ BACK BY AND VERIFIED WITH: JAMIE TRACY,RN [id number] @ [address] [place] (NOTE)  Amended report. Result repeated and verified. CORRECTED ON 10/14 AT 1429: PREVIOUSLY REPORTED AS Result repeated and verified.   ALKPHOS 57 06/29/2014 1227   ALKPHOS 54 05/18/2014 1308   AST 14 06/29/2014 1227   AST 20 05/18/2014 1308   ALT 12 06/29/2014 1227   ALT 17 05/18/2014 1308   BILITOT 0.34 06/29/2014 1227   BILITOT 0.4 05/18/2014 1308       RADIOGRAPHIC STUDIES: No results found.  ASSESSMENT AND PLAN: this is a very pleasant 78 years old African-American female with history of multiple myeloma currently undergoing systemic chemotherapy according to the BMS [place] 204143 clinical trial with Elotuzumab, Revlimid and dexamethasone. She status post 3 cycles and tolerating her treatment fairly well. We'll proceed with cycle #4 today as scheduled. The patient would come back for follow-up visit in one month for reevaluation before starting cycle #5. She was advised to call immediately if she has any concerning symptoms in the interval. The patient voices understanding of current disease status and treatment options and is in agreement with the current care plan.  All questions were answered. The patient knows to call the clinic with any problems, questions or concerns. We can certainly see the patient much sooner if necessary.  Disclaimer: This note was dictated with voice recognition software. Similar sounding words can inadvertently be transcribed and may not be corrected upon review.

## 2014-07-06 NOTE — Progress Notes (Signed)
Diabetes:  Using medications without difficulties:yes Hypoglycemic episodes: no sx Hyperglycemic episodes: no sx Feet problems: she has encroachment at the 1st webspace on L foot, with 1st toe rubbing the 2nd.  No skin breakdown.  Blood Sugars averaging: not checked often.  pred dose eliminated.  Still on decadron.  Due for A1c, we'll ask for this to be added on next set of labs.   Meds, vitals, and allergies reviewed.   ROS: See HPI.  Otherwise negative.    GEN: nad, alert and oriented HEENT: mucous membranes moist, cyst noted on L side of mandible NECK: supple w/o LA CV: rrr. PULM: ctab, no inc wob ABD: soft, +bs EXT: no edema SKIN: no acute rash but a few resolving/resolved lesions on the face B, had improved with topical benadryl cream.   Diabetic foot exam: Normal inspection No skin breakdown She has lateral deviation of the L 1st toe toward the 2nd toe

## 2014-07-06 NOTE — Progress Notes (Signed)
INR below goal today Pt seen in infusion area Pt is doing well with no complaints at this time except for fatigue No unusual bleeding or bruising No missed or extra doses per her daughter No medication or diet changes Plan: Increase coumadin slightly to 2.5mg  daily except 5 mg on Thursdays.  Recheck PT/INR on 07/20/14; Lab at 9:45am,  We will see in the infusion area.

## 2014-07-06 NOTE — Telephone Encounter (Signed)
gv adn printed appt sched nad avs for pt for NOV and Dec

## 2014-07-06 NOTE — Patient Instructions (Addendum)
I'll check on getting your A1c added on.   If you have more of the rash on your face, then let me know.  Use a rubber toe separator on your left foot to keep it from rubbing.  Check your sugar a few mornings and notify me with the results.  Take care.  Glad to see you.

## 2014-07-06 NOTE — Assessment & Plan Note (Signed)
She'll check her sugar more often and notify me.  Add on A1c next week with labs.  She is on decadron, but not on prednisone now.  She has lateral deviation of the L 1st toe toward the 2nd toe, she'll use a spacer to prevent rubbing.  She had an incidental resolving rash on the face, resolved with benadryl.  If it returns, she'll notify me.  Doesn't appear to be med related.

## 2014-07-06 NOTE — Patient Instructions (Signed)
[facility] Discharge Instructions for Patients Receiving Chemotherapy  Today you received the following chemotherapy agents Elotuzumab  To help prevent nausea and vomiting after your treatment, we encourage you to take your nausea medication   If you develop nausea and vomiting that is not controlled by your nausea medication, call the clinic.   BELOW ARE SYMPTOMS THAT SHOULD BE REPORTED IMMEDIATELY:  *FEVER GREATER THAN 100.5 F  *CHILLS WITH OR WITHOUT FEVER  NAUSEA AND VOMITING THAT IS NOT CONTROLLED WITH YOUR NAUSEA MEDICATION  *UNUSUAL SHORTNESS OF BREATH  *UNUSUAL BRUISING OR BLEEDING  TENDERNESS IN MOUTH AND THROAT WITH OR WITHOUT PRESENCE OF ULCERS  *URINARY PROBLEMS  *BOWEL PROBLEMS  UNUSUAL RASH Items with * indicate a potential emergency and should be followed up as soon as possible.  Feel free to call the clinic you have any questions or concerns. The clinic phone number is [phone].

## 2014-07-06 NOTE — Progress Notes (Signed)
07/06/2014 1030 [name] study cycle 4, day 1 Met with patient and her daughter.  [name] reports low back pain and ongoing neuropathy, but no major or different complaints today. She denies increased weakness, bleeding, fever, or other complaints that are different from her baseline. Her weight is 54.6 kg today. Patient reports taking her Decadron 28 mg po last night at 2200. She was seen today by Dr. Julien Nordmann.  CBC and CMET were reviewed with Dr. Julien Nordmann.  Per MD, patient is to resume treatment per protocol.  Patient and daughter were given instructions to start back on Lenalidomide today for 21 days per Dr. Julien Nordmann. Reviewed with patient's daughter the schedule for taking Dexamethasone and  Lenalidomide per protocol. She was without questions.  A calendar with medication instructions was provided.  Vial numbers for elotuzumab from BMS was provided to pharmacist.  Communicated to pharmacist, [name], the elotuzumab dosing information using cycle 4, day 1 weight. This was confirmed by [name], RN.  Signs for infusion administration and reactions were given to [name] ,RN. Reviewed the use of a filter, timing of pre-meds,and infusion rate.  [name] was without questions.

## 2014-07-13 ENCOUNTER — Encounter: Payer: Self-pay | Admitting: *Deleted

## 2014-07-13 ENCOUNTER — Telehealth: Payer: Self-pay | Admitting: *Deleted

## 2014-07-13 ENCOUNTER — Other Ambulatory Visit (HOSPITAL_BASED_OUTPATIENT_CLINIC_OR_DEPARTMENT_OTHER): Payer: Medicare Other

## 2014-07-13 ENCOUNTER — Other Ambulatory Visit: Payer: Self-pay | Admitting: *Deleted

## 2014-07-13 DIAGNOSIS — Z7901 Long term (current) use of anticoagulants: Secondary | ICD-10-CM

## 2014-07-13 DIAGNOSIS — I82401 Acute embolism and thrombosis of unspecified deep veins of right lower extremity: Secondary | ICD-10-CM

## 2014-07-13 DIAGNOSIS — C9002 Multiple myeloma in relapse: Secondary | ICD-10-CM

## 2014-07-13 LAB — CBC WITH DIFFERENTIAL/PLATELET
BASO%: 0.2 % (ref 0.0–2.0)
Basophils Absolute: 0 10*3/uL (ref 0.0–0.1)
EOS%: 0.6 % (ref 0.0–7.0)
Eosinophils Absolute: 0 10*3/uL (ref 0.0–0.5)
HCT: 34.4 % — ABNORMAL LOW (ref 34.8–46.6)
HGB: 10.7 g/dL — ABNORMAL LOW (ref 11.6–15.9)
LYMPH#: 0.4 10*3/uL — AB (ref 0.9–3.3)
LYMPH%: 7.7 % — ABNORMAL LOW (ref 14.0–49.7)
MCH: 29.5 pg (ref 25.1–34.0)
MCHC: 31.1 g/dL — AB (ref 31.5–36.0)
MCV: 94.6 fL (ref 79.5–101.0)
MONO#: 0.1 10*3/uL (ref 0.1–0.9)
MONO%: 1.1 % (ref 0.0–14.0)
NEUT#: 4.5 10*3/uL (ref 1.5–6.5)
NEUT%: 90.4 % — ABNORMAL HIGH (ref 38.4–76.8)
Platelets: 239 10*3/uL (ref 145–400)
RBC: 3.63 10*6/uL — AB (ref 3.70–5.45)
RDW: 18.3 % — AB (ref 11.2–14.5)
WBC: 4.9 10*3/uL (ref 3.9–10.3)

## 2014-07-13 LAB — COMPREHENSIVE METABOLIC PANEL (CC13)
ALBUMIN: 3.4 g/dL — AB (ref 3.5–5.0)
ALK PHOS: 71 U/L (ref 40–150)
ALT: 14 U/L (ref 0–55)
AST: 14 U/L (ref 5–34)
Anion Gap: 10 mEq/L (ref 3–11)
BUN: 11.8 mg/dL (ref 7.0–26.0)
CO2: 25 mEq/L (ref 22–29)
CREATININE: 1 mg/dL (ref 0.6–1.1)
Calcium: 9.9 mg/dL (ref 8.4–10.4)
Chloride: 104 mEq/L (ref 98–109)
Glucose: 178 mg/dl — ABNORMAL HIGH (ref 70–140)
Potassium: 3.9 mEq/L (ref 3.5–5.1)
Sodium: 140 mEq/L (ref 136–145)
Total Bilirubin: 0.34 mg/dL (ref 0.20–1.20)
Total Protein: 6.1 g/dL — ABNORMAL LOW (ref 6.4–8.3)

## 2014-07-13 LAB — PROTIME-INR
INR: 2.2 (ref 2.00–3.50)
PROTIME: 26.4 s — AB (ref 10.6–13.4)

## 2014-07-13 LAB — HEMOGLOBIN A1C: HEMOGLOBIN A1C: 6.5 % — AB (ref 4.0–6.0)

## 2014-07-13 NOTE — Progress Notes (Signed)
07/13/2014 1150 Cycle 4, day 8 [name] Patient in the clinic today for labs. Met with patient and her daughter, [name], to check in and assess for questions or needs. Patient reports taking Decadron 40 mg this morning and has continued to take the Lenalidomide as instructed. Patient and her daughter report no complaints or changes in condition since last week. Reviewed schedule with patient and her daughter. They were without questions and verbalized understanding. Will continue to follow.

## 2014-07-13 NOTE — Telephone Encounter (Signed)
Pt's daughter [name] called stating that pt's left hand has been cramping up, her thumb is bent in and she is having trouble opening and closing her hand.  Spoke with Dr. Julien Nordmann, he states tx needs to be held for now and we need to inform [name] in research.  Called and informed [name] that this is a s/e of the elotuzumab and [name] will be in touch to discuss the next steps.  [name] verbalized understanding.

## 2014-07-14 ENCOUNTER — Other Ambulatory Visit: Payer: Self-pay | Admitting: Medical Oncology

## 2014-07-14 ENCOUNTER — Telehealth: Payer: Self-pay | Admitting: *Deleted

## 2014-07-14 ENCOUNTER — Encounter: Payer: Self-pay | Admitting: Family Medicine

## 2014-07-14 ENCOUNTER — Telehealth: Payer: Self-pay | Admitting: Internal Medicine

## 2014-07-14 NOTE — Telephone Encounter (Signed)
Attempted to reach patient's daughter, [name].  Left voice message with name and contact number asking to please return call.  POF was sent per Dr. Worthy Flank nurse to schedule MD visit for 07/20/14.

## 2014-07-14 NOTE — Telephone Encounter (Signed)
Patient's daughter [name] returned phone call.  She stated her mother had the hand cramping episode 07/13/14 after she left clinic earlier in the day. She stated her mother admitted to her on 07/13/14 that she had also had one more episode prior to that on a different day (could not give specific date).  Patient's daughter is aware that Dr. Julien Nordmann will see patient on 07/20/14.  Will discuss MD plans for treatment further with Dr. Julien Nordmann on 07/17/14 when MD returns.  She thanked this Therapist, sports and was without questions.

## 2014-07-14 NOTE — Progress Notes (Unsigned)
ONC tx requests sent for MD visit next Thursday nov 19th.

## 2014-07-18 ENCOUNTER — Encounter: Payer: Self-pay | Admitting: Family Medicine

## 2014-07-19 ENCOUNTER — Telehealth: Payer: Self-pay | Admitting: *Deleted

## 2014-07-19 ENCOUNTER — Other Ambulatory Visit: Payer: Self-pay | Admitting: Medical Oncology

## 2014-07-19 ENCOUNTER — Encounter: Payer: Self-pay | Admitting: *Deleted

## 2014-07-19 ENCOUNTER — Other Ambulatory Visit: Payer: Self-pay | Admitting: *Deleted

## 2014-07-19 DIAGNOSIS — C9000 Multiple myeloma not having achieved remission: Secondary | ICD-10-CM

## 2014-07-19 NOTE — Telephone Encounter (Signed)
[name] study phone call Spoke with Dr. Julien Nordmann on 07/18/14.  Per Dr. Julien Nordmann, patient is to take 28 mg Dexamethasone po prior to MD visit on 07/20/14.  At MD visit Dr. Julien Nordmann to decide on continuation of study drug, elotuzumab.  Spoke with patient's daughter [name] today at 1500 re: above.  She verbalized understanding.

## 2014-07-19 NOTE — Telephone Encounter (Signed)
THIS REFILL REQUEST FOR REVLIMID WAS GIVEN TO DR.MOHAMED'S NURSE, DIANE BELL,RN.

## 2014-07-20 ENCOUNTER — Other Ambulatory Visit: Payer: Self-pay | Admitting: Medical Oncology

## 2014-07-20 ENCOUNTER — Telehealth: Payer: Self-pay

## 2014-07-20 ENCOUNTER — Ambulatory Visit (HOSPITAL_COMMUNITY)
Admission: RE | Admit: 2014-07-20 | Discharge: 2014-07-20 | Disposition: A | Discharge status: Home / Self Care | Payer: Medicare Other | Source: Ambulatory Visit | Attending: Internal Medicine | Admitting: Internal Medicine

## 2014-07-20 ENCOUNTER — Ambulatory Visit (HOSPITAL_BASED_OUTPATIENT_CLINIC_OR_DEPARTMENT_OTHER): Payer: Medicare Other | Admitting: Internal Medicine

## 2014-07-20 ENCOUNTER — Ambulatory Visit: Payer: Medicare Other | Admitting: Internal Medicine

## 2014-07-20 ENCOUNTER — Encounter: Payer: Self-pay | Admitting: Internal Medicine

## 2014-07-20 ENCOUNTER — Other Ambulatory Visit: Payer: Medicare Other

## 2014-07-20 ENCOUNTER — Ambulatory Visit: Payer: Medicare Other | Admitting: Pharmacist

## 2014-07-20 ENCOUNTER — Encounter: Payer: Self-pay | Admitting: *Deleted

## 2014-07-20 ENCOUNTER — Ambulatory Visit (HOSPITAL_BASED_OUTPATIENT_CLINIC_OR_DEPARTMENT_OTHER): Payer: Medicare Other

## 2014-07-20 ENCOUNTER — Ambulatory Visit: Payer: Medicare Other | Admitting: Nutrition

## 2014-07-20 ENCOUNTER — Telehealth: Payer: Self-pay | Admitting: Internal Medicine

## 2014-07-20 ENCOUNTER — Other Ambulatory Visit (HOSPITAL_BASED_OUTPATIENT_CLINIC_OR_DEPARTMENT_OTHER): Payer: Medicare Other

## 2014-07-20 VITALS — BP 107/58 | HR 68 | Temp 97.7°F | Resp 17 | Ht <= 58 in | Wt 119.4 lb

## 2014-07-20 DIAGNOSIS — C9000 Multiple myeloma not having achieved remission: Secondary | ICD-10-CM

## 2014-07-20 DIAGNOSIS — C9002 Multiple myeloma in relapse: Secondary | ICD-10-CM

## 2014-07-20 DIAGNOSIS — Z006 Encounter for examination for normal comparison and control in clinical research program: Secondary | ICD-10-CM

## 2014-07-20 DIAGNOSIS — M25521 Pain in right elbow: Secondary | ICD-10-CM | POA: Insufficient documentation

## 2014-07-20 DIAGNOSIS — E876 Hypokalemia: Secondary | ICD-10-CM

## 2014-07-20 DIAGNOSIS — Z5112 Encounter for antineoplastic immunotherapy: Secondary | ICD-10-CM

## 2014-07-20 DIAGNOSIS — Z7901 Long term (current) use of anticoagulants: Secondary | ICD-10-CM

## 2014-07-20 DIAGNOSIS — I82401 Acute embolism and thrombosis of unspecified deep veins of right lower extremity: Secondary | ICD-10-CM

## 2014-07-20 LAB — PROTIME-INR
INR: 4.3 — AB (ref 2.00–3.50)
PROTIME: 51.6 s — AB (ref 10.6–13.4)

## 2014-07-20 LAB — COMPREHENSIVE METABOLIC PANEL (CC13)
ALBUMIN: 3.5 g/dL (ref 3.5–5.0)
ALT: 14 U/L (ref 0–55)
AST: 14 U/L (ref 5–34)
Alkaline Phosphatase: 67 U/L (ref 40–150)
Anion Gap: 11 mEq/L (ref 3–11)
BILIRUBIN TOTAL: 0.41 mg/dL (ref 0.20–1.20)
BUN: 21.1 mg/dL (ref 7.0–26.0)
CO2: 32 meq/L — AB (ref 22–29)
Calcium: 9.8 mg/dL (ref 8.4–10.4)
Chloride: 98 mEq/L (ref 98–109)
Creatinine: 1.1 mg/dL (ref 0.6–1.1)
GLUCOSE: 169 mg/dL — AB (ref 70–140)
POTASSIUM: 3.2 meq/L — AB (ref 3.5–5.1)
SODIUM: 141 meq/L (ref 136–145)
TOTAL PROTEIN: 6.2 g/dL — AB (ref 6.4–8.3)

## 2014-07-20 LAB — CBC WITH DIFFERENTIAL/PLATELET
BASO%: 0 % (ref 0.0–2.0)
Basophils Absolute: 0 10*3/uL (ref 0.0–0.1)
EOS ABS: 0.2 10*3/uL (ref 0.0–0.5)
EOS%: 4.3 % (ref 0.0–7.0)
HCT: 34.1 % — ABNORMAL LOW (ref 34.8–46.6)
HGB: 10.6 g/dL — ABNORMAL LOW (ref 11.6–15.9)
LYMPH%: 5.9 % — AB (ref 14.0–49.7)
MCH: 29.4 pg (ref 25.1–34.0)
MCHC: 31.1 g/dL — AB (ref 31.5–36.0)
MCV: 94.7 fL (ref 79.5–101.0)
MONO#: 0.3 10*3/uL (ref 0.1–0.9)
MONO%: 5.4 % (ref 0.0–14.0)
NEUT%: 84.4 % — ABNORMAL HIGH (ref 38.4–76.8)
NEUTROS ABS: 4.7 10*3/uL (ref 1.5–6.5)
PLATELETS: 152 10*3/uL (ref 145–400)
RBC: 3.6 10*6/uL — AB (ref 3.70–5.45)
RDW: 17 % — AB (ref 11.2–14.5)
WBC: 5.6 10*3/uL (ref 3.9–10.3)
lymph#: 0.3 10*3/uL — ABNORMAL LOW (ref 0.9–3.3)

## 2014-07-20 LAB — POCT INR: INR: 4.3

## 2014-07-20 MED ORDER — ACETAMINOPHEN 325 MG PO TABS
ORAL_TABLET | ORAL | Status: AC
  Filled 2014-07-20: qty 2

## 2014-07-20 MED ORDER — DIPHENHYDRAMINE HCL 25 MG PO CAPS
ORAL_CAPSULE | ORAL | Status: AC
  Filled 2014-07-20: qty 2

## 2014-07-20 MED ORDER — OXYCODONE HCL 5 MG PO CAPS: 5.0000 mg | ORAL_CAPSULE | Freq: Four times a day (QID) | ORAL | Status: DC | PRN

## 2014-07-20 MED ORDER — DIPHENHYDRAMINE HCL 25 MG PO CAPS
50.0000 mg | ORAL_CAPSULE | Freq: Once | ORAL | Status: AC
  Administered 2014-07-20: 50 mg via ORAL

## 2014-07-20 MED ORDER — POTASSIUM CHLORIDE CRYS ER 20 MEQ PO TBCR: 20.0000 meq | EXTENDED_RELEASE_TABLET | ORAL | Status: DC

## 2014-07-20 MED ORDER — FAMOTIDINE IN NACL 20-0.9 MG/50ML-% IV SOLN
INTRAVENOUS | Status: AC
  Filled 2014-07-20: qty 50

## 2014-07-20 MED ORDER — ACETAMINOPHEN 325 MG PO TABS
650.0000 mg | ORAL_TABLET | Freq: Once | ORAL | Status: AC
  Administered 2014-07-20: 650 mg via ORAL

## 2014-07-20 MED ORDER — ONDANSETRON 8 MG/NS 50 ML IVPB
INTRAVENOUS | Status: AC
  Filled 2014-07-20: qty 8

## 2014-07-20 MED ORDER — FAMOTIDINE IN NACL 20-0.9 MG/50ML-% IV SOLN
20.0000 mg | Freq: Once | INTRAVENOUS | Status: AC
  Administered 2014-07-20: 20 mg via INTRAVENOUS

## 2014-07-20 MED ORDER — DEXAMETHASONE SODIUM PHOSPHATE 10 MG/ML IJ SOLN
INTRAMUSCULAR | Status: AC
  Filled 2014-07-20: qty 1

## 2014-07-20 MED ORDER — DEXAMETHASONE SODIUM PHOSPHATE 10 MG/ML IJ SOLN
8.0000 mg | Freq: Once | INTRAMUSCULAR | Status: AC
  Administered 2014-07-20: 8 mg via INTRAVENOUS

## 2014-07-20 MED ORDER — SODIUM CHLORIDE 0.9 % IV SOLN
Freq: Once | INTRAVENOUS | Status: AC
  Administered 2014-07-20: 12:00:00 via INTRAVENOUS

## 2014-07-20 MED ORDER — SODIUM CHLORIDE 0.9 % IV SOLN
10.0000 mg/kg | Freq: Once | INTRAVENOUS | Status: AC
  Administered 2014-07-20: 545 mg via INTRAVENOUS
  Filled 2014-07-20: qty 21.8

## 2014-07-20 MED ORDER — DEXAMETHASONE SODIUM PHOSPHATE 20 MG/5ML IJ SOLN
INTRAMUSCULAR | Status: AC
  Filled 2014-07-20: qty 5

## 2014-07-20 MED ORDER — LENALIDOMIDE 25 MG PO CAPS: 25.0000 mg | ORAL_CAPSULE | Freq: Every day | ORAL | Status: DC

## 2014-07-20 NOTE — Telephone Encounter (Signed)
Fax from [facility], they have received medication request and will verify insurance.

## 2014-07-20 NOTE — Progress Notes (Signed)
07/20/2014 1100 cycle 4, day 15 [name] study  Met with patient and her daughter, [name]. Confirmed with patient and her daughter that patient took her Dexamethasone 28 mg at 6 am today. Also confirmed pt is taking her Revlimid daily as prescribed.  Patient was seen and examined by Dr. Julien Nordmann.  Patient complained of right shoulder, elbow, and arm pain, without spasms, and denied constant pain.  MD feels unrelated to study drug and plans to order an xray.  MD stated patient is cleared to receive elotuzumab treatment today and to continue with Revlimid.  Spoke with infusion RN, [employer], re: treatment instructions. Provided research signs with pre-med instructions, including, length of infusion, filter to use, and drug reaction information. Treating RN was without questions. Pharmacist was given the vial assignments from [facility].   Reviewed the medications for cycle 4, day 22 with patient and her daughter. Copy of December study medication schedule for cycle 5 was provided and reviewed with patient and her daughter. They were without questions.

## 2014-07-20 NOTE — Progress Notes (Signed)
INR slightly above goal today. Hg/Hct:  10.6/34.1, Pltc:  152 Patient seen in infusion area with daughter. No problems or concerns regarding anticoagulation. No unusual bruising. No bleeding. No s/s of clotting noted. No changes in medications. Pt is complaining of arm pain/spasms and will have x-ray done. INR likely above goal due to changes in diet. Hold coumadin today (11/19) and tomorrow (11/20).   On 11/21, decrease coumadin to 2.5mg  daily.  Recheck PT/INR on 07/28/14; lab at 11am and coumadin clinic at 11:15am. Will evaluate INR and adjust coumadin dose as necessary.

## 2014-07-20 NOTE — Addendum Note (Signed)
Addended by: Ardeen Garland on: 07/20/2014 09:09 AM   Modules accepted: Orders

## 2014-07-20 NOTE — Progress Notes (Signed)
Refill for oxyir given to daughter.

## 2014-07-20 NOTE — Telephone Encounter (Signed)
added pt appts....pt will get sched in chemo

## 2014-07-20 NOTE — Progress Notes (Signed)
[place] [phone]   [phone]  OFFICE PROGRESS NOTE  Lindsay Stain, MD [place] [address]  DIAGNOSIS:  1. Multiple myeloma diagnosed in September 2009,  2. history of bilateral lower extremity deep vein thrombosis diagnosed in November 2009  3. acute on chronic deep vein thrombosis in the left lower extremity diagnosed in October 2010.   PRIOR THERAPY:  1. status post palliative radiotherapy to the right femur and ischail tuberosity, first was done in December 2011 and the second treatment was completed Jan 08 2011, and the third treatment to the right distal femur completed on 04/30/2011  2. status post palliative radiotherapy to the left proximal femur completed 02/28/2011.  3. Revlimid 25 mg by mouth daily for 21 days every 4 weeks in addition to oral Decadron at 40 mg by mouth on a weekly basis status post 40 cycles.  4. weekly subcutaneous Velcade 1.3 mg/M2 status post 60 cycles, discontinued today secondary to mild disease progression. 5. Pomalyst 4 mg by mouth daily for 21 days every 4 weeks, the patient started the first dose on 12/22/2012 , in addition to Decadron 40 mg weekly. Status post 13 cycles.   CURRENT THERAPY:  1. Chemotherapy according to the [facility] [id number] expanded access treatment protocol with Elotuzumab, lenalidomide and dexamthasone. Status post 4 cycles. 2. Coumadin 5 mg on Mondays and Thursdays and 2.5 mg on all other days. 3. Zometa 4 mg IV given every 3 months.  INTERVAL HISTORY: [name] 78 y.o. female returns to the clinic today for follow-up visit accompanied by her daughter. The patient is tolerating her current treatment fairly well with no significant adverse effect fairly well except for occasional spasm and the right elbow and hand as well as the left shoulder. She denied having any significant weight loss or night sweats. She has no nausea or vomiting. The patient denied  having any significant chest pain, shortness of breath, cough or hemoptysis. She is here today for day 15 of the fourth cycle.  MEDICAL HISTORY: Past Medical History  Diagnosis Date  . Blood transfusion   . Depression     Mild  . History of chicken pox   . Hyperlipidemia   . Hypertension   . Degenerative joint disease   . Phlebitis   . Multiple myeloma     per Dr. Julien Nordmann, s/p palliative radiation for leg pain 2011 per Dr. Sondra Come  . Deep vein thrombosis of bilateral lower extremities   . Family history of anesthesia complication     DAUGHTER CPR AFTER  . Cardiomyopathy   . Diabetes mellitus     Steroid related  . UTI (urinary tract infection) 09/08/2013  . History of radiation therapy 08/30/13-09/20/13    20 gray to lower lumbar/upper sacrum    ALLERGIES:  has No Known Allergies.  MEDICATIONS:  Current Outpatient Prescriptions  Medication Sig Dispense Refill  . acyclovir (ZOVIRAX) 400 MG tablet TAKE ONE TABLET BY MOUTH TWICE DAILY 60 tablet 0  . ALPRAZolam (XANAX) 0.5 MG tablet Take 1 tablet (0.5 mg total) by mouth 2 (two) times daily as needed for anxiety. 60 tablet 0  . Alum & Mag Hydroxide-Simeth (MAGIC MOUTHWASH W/LIDOCAINE) SOLN Take 5 mLs by mouth 3 (three) times daily as needed for mouth pain (do not swallow, swish and spit 59ml TID PRN). 120 mL 0  . Calcium Carbonate-Vitamin D 600-400 MG-UNIT per tablet Take 1 tablet by mouth 3 (three) times daily  with meals.      Marland Kitchen dexamethasone (DECADRON) 4 MG tablet Take 10 tab (40 mg) weekly except on days when you receive elotuzumab. On those days take 7 tab (28 mg) between 3 & 24 hours prior to the start of elotuzumab.    Marland Kitchen dextromethorphan-guaiFENesin (MUCINEX DM) 30-600 MG per 12 hr tablet Take 1 tablet by mouth 2 (two) times daily.    . diphenhydrAMINE (SOMINEX) 25 MG tablet Take 25 mg by mouth at bedtime as needed for sleep.    Marland Kitchen glipiZIDE (GLUCOTROL XL) 2.5 MG 24 hr tablet Take 2.5 mg by mouth daily with breakfast.    . glucose  blood (ONE TOUCH ULTRA TEST) test strip Use as instructed 25 each 6  . Lancets (ONETOUCH ULTRASOFT) lancets Test blood sugar once daily or as directed. 100 each 11  . lenalidomide (REVLIMID) 25 MG capsule Take 1 capsule (25 mg total) by mouth daily. 25mg  daily, repeat every 28 days.  auth # [id number] 07/18/14.  Female--not of childbearing potential. 21 capsule 0  . lisinopril-hydrochlorothiazide (PRINZIDE,ZESTORETIC) 20-12.5 MG per tablet TAKE ONE-HALF TABLET BY MOUTH ONCE DAILY 45 tablet 1  . oxycodone (OXY-IR) 5 MG capsule Take 1 capsule (5 mg total) by mouth every 6 (six) hours as needed for pain (1 1/2 tablets every 6 hours prn pain). 90 capsule 0  . OxyCODONE (OXYCONTIN) 10 mg T12A 12 hr tablet Take 1 tablet (10 mg total) by mouth every 12 (twelve) hours. 60 tablet 0  . potassium chloride SA (K-DUR,KLOR-CON) 20 MEQ tablet Take 1 tablet (20 mEq total) by mouth as directed. 30 tablet 0  . prochlorperazine (COMPAZINE) 10 MG tablet TAKE ONE TABLET BY MOUTH EVERY 6 HOURS AS NEEDED 30 tablet 0  . warfarin (COUMADIN) 5 MG tablet Take 2.5-5 mg by mouth daily. Takes 1(5mg ) tablet on mondays, Wednesday. Takes 1/2(2.5mg ) tablet on Tuesday,thursday, Friday, Saturday and sunday    . loperamide (IMODIUM A-D) 2 MG tablet Take 1 tablet (2 mg total) by mouth 4 (four) times daily as needed for diarrhea or loose stools.    . temazepam (RESTORIL) 30 MG capsule Take 1 capsule (30 mg total) by mouth at bedtime as needed for sleep. 3 capsule 0   No current facility-administered medications for this visit.   Facility-Administered Medications Ordered in Other Visits  Medication Dose Route Frequency Provider Last Rate Last Dose  . Zoledronic Acid (ZOMETA) 4 mg IVPB  4 mg Intravenous Once Curt Bears, MD        SURGICAL HISTORY:  Past Surgical History  Procedure Laterality Date  . Abdominal hysterectomy    . Ankle fracture surgery Left     REVIEW OF SYSTEMS:  A comprehensive review of systems was negative  except for: Musculoskeletal: positive for back pain Muscle spasm in the right elbow and wrist as well as left shoulder.   PHYSICAL EXAMINATION: General appearance: alert, cooperative and no distress Head: Normocephalic, without obvious abnormality, atraumatic Neck: no adenopathy, no JVD, supple, symmetrical, trachea midline and thyroid not enlarged, symmetric, no tenderness/mass/nodules Lymph nodes: Cervical, supraclavicular, and axillary nodes normal. Resp: clear to auscultation bilaterally Back: symmetric, no curvature. ROM normal. No CVA tenderness. Cardio: regular rate and rhythm, S1, S2 normal, no murmur, click, rub or gallop GI: soft, non-tender; bowel sounds normal; no masses,  no organomegaly Extremities: extremities normal, atraumatic, no cyanosis or edema  ECOG PERFORMANCE STATUS: 1 - Symptomatic but completely ambulatory  Blood pressure 107/58, pulse 68, temperature 97.7 F (36.5 C), temperature source Oral,  resp. rate 17, height $RemoveBe'4\' 10"'rDiljNMZD$  (1.473 m), weight 119 lb 6.4 oz (54.159 kg), SpO2 100 %.  LABORATORY DATA: Lab Results  Component Value Date   WBC 5.6 07/20/2014   HGB 10.6* 07/20/2014   HCT 34.1* 07/20/2014   MCV 94.7 07/20/2014   PLT 152 07/20/2014      Chemistry      Component Value Date/Time   NA 140 07/13/2014 1127   NA 141 05/18/2014 1308   K 3.9 07/13/2014 1127   K 3.3* 05/18/2014 1308   CL 97 05/18/2014 1308   CL 107 02/14/2013 1311   CO2 25 07/13/2014 1127   CO2 30 05/18/2014 1308   BUN 11.8 07/13/2014 1127   BUN 14 05/18/2014 1308   CREATININE 1.0 07/13/2014 1127   CREATININE 0.99 05/18/2014 1308      Component Value Date/Time   CALCIUM 9.9 07/13/2014 1127   CALCIUM 10.7* 05/18/2014 1308   CALCIUM * 06/13/2008 1605    5.7 CRITICAL RESULT CALLED TO, READ BACK BY AND VERIFIED WITH: JAMIE TRACY,RN [id number] @ [id number] BY J SCOTTON (NOTE)  Amended report. Result repeated and verified. CORRECTED ON 10/14 AT 1429: PREVIOUSLY REPORTED AS Result repeated and  verified.   ALKPHOS 71 07/13/2014 1127   ALKPHOS 54 05/18/2014 1308   AST 14 07/13/2014 1127   AST 20 05/18/2014 1308   ALT 14 07/13/2014 1127   ALT 17 05/18/2014 1308   BILITOT 0.34 07/13/2014 1127   BILITOT 0.4 05/18/2014 1308       RADIOGRAPHIC STUDIES: No results found.  ASSESSMENT AND PLAN: this is a very pleasant 79 years old African-American female with history of multiple myeloma currently undergoing systemic chemotherapy according to the [facility] CA 204143 clinical trial with Elotuzumab, Revlimid and dexamethasone. She status post 3 cycles and tolerating her treatment fairly well except for the occasional muscle spasm.  I will order an x-ray of the right elbow to evaluate for any myeloma lesion or other abnormalities. We'll proceed with day 15 of cycle #4 today as scheduled. The patient would come back for follow-up visit in 2 weeks for reevaluation before starting cycle #5.  For hypokalemia, the patient was started on K Dur 20 meq by mouth daily. She was advised to call immediately if she has any concerning symptoms in the interval. The patient voices understanding of current disease status and treatment options and is in agreement with the current care plan.  All questions were answered. The patient knows to call the clinic with any problems, questions or concerns. We can certainly see the patient much sooner if necessary.  Disclaimer: This note was dictated with voice recognition software. Similar sounding words can inadvertently be transcribed and may not be corrected upon review.

## 2014-07-20 NOTE — Patient Instructions (Signed)
[facility] Discharge Instructions for Patients Receiving Chemotherapy  Today you received the following chemotherapy agents ; Elotuzumab  To help prevent nausea and vomiting after your treatment, we encourage you to take your nausea medication as directed.    If you develop nausea and vomiting that is not controlled by your nausea medication, call the clinic.   BELOW ARE SYMPTOMS THAT SHOULD BE REPORTED IMMEDIATELY:  *FEVER GREATER THAN 100.5 F  *CHILLS WITH OR WITHOUT FEVER  NAUSEA AND VOMITING THAT IS NOT CONTROLLED WITH YOUR NAUSEA MEDICATION  *UNUSUAL SHORTNESS OF BREATH  *UNUSUAL BRUISING OR BLEEDING  TENDERNESS IN MOUTH AND THROAT WITH OR WITHOUT PRESENCE OF ULCERS  *URINARY PROBLEMS  *BOWEL PROBLEMS  UNUSUAL RASH Items with * indicate a potential emergency and should be followed up as soon as possible.  Feel free to call the clinic you have any questions or concerns. The clinic phone number is [phone].

## 2014-07-20 NOTE — Patient Instructions (Signed)
Hold coumadin today (11/19) and tomorrow (11/20).   On 11/21, decrease coumadin to 2.5mg  daily.  Recheck PT/INR on 07/28/14; lab at 11am and coumadin clinic at 11:15am.

## 2014-07-20 NOTE — Progress Notes (Signed)
Patient identified at risk for malnutrition on the malnutrition risk screen.  Patient is a 78 year old female patient diagnosed with multiple myeloma September 2009.  She is a patient of Dr. Earlie Server.  Past medical history includes depression, hyperlipidemia, hypertension, degenerative joint disease, diabetes, UTI, cardiomyopathy, and radiation therapy.  Medications include Xanax, Magic mouthwash, vitamin D, Decadron, Glucotrol XL, Revlimid, Imodium, Compazine, K-Dur, Coumadin.  Labs include glucose 178, and albumin 3.4 on November 12.  Height: 4 feet 10 inches. Weight: 119.4 pounds November 19. Usual body weight: 125 pounds. BMI: 24.96.  Patient reports her appetite comes and goes.  Weight is down approximately 6 pounds from usual body weight.  History is difficult to obtain from patient today.  She does report she does not tolerate oral nutrition supplements.  She will drink milk occasionally.  Nutrition diagnosis: Unintended weight loss related to poor appetite as evidenced by 6 pound weight loss from usual body weight.  Intervention: Patient educated to consume small, frequent meals and snacks using, high-calorie, high-protein foods. Provided patient with fact sheet on ways to increase calories and protein. Teach back method used.  Contact information was given.  Monitoring, evaluation, goals: Patient will tolerate increased oral intake to minimize further weight loss.  Next visit: Not scheduled at this time.  **Disclaimer: This note was dictated with voice recognition software. Similar sounding words can inadvertently be transcribed and this note may contain transcription errors which may not have been corrected upon publication of note.**

## 2014-07-20 NOTE — Telephone Encounter (Signed)
Pt confirmed labs/ov per 11/19 POF, gave pt AVS.... KJ, sent msg to add chemo also pt's daughter is going to be checking with radiology for the scan for today...Marland KitchenMarland Kitchen

## 2014-07-26 ENCOUNTER — Encounter: Payer: Self-pay | Admitting: *Deleted

## 2014-07-26 NOTE — Progress Notes (Signed)
RECEIVED A FAX FROM [facility] CONCERNING A CONFIRMATION OF PRESCRIPTION SHIPMENT FOR REVLIMID ON 07/25/14.

## 2014-07-28 ENCOUNTER — Ambulatory Visit (HOSPITAL_BASED_OUTPATIENT_CLINIC_OR_DEPARTMENT_OTHER): Payer: Medicare Other | Admitting: Pharmacist

## 2014-07-28 ENCOUNTER — Other Ambulatory Visit (HOSPITAL_BASED_OUTPATIENT_CLINIC_OR_DEPARTMENT_OTHER): Payer: Medicare Other

## 2014-07-28 ENCOUNTER — Telehealth: Payer: Self-pay | Admitting: *Deleted

## 2014-07-28 DIAGNOSIS — C9000 Multiple myeloma not having achieved remission: Secondary | ICD-10-CM

## 2014-07-28 DIAGNOSIS — I82401 Acute embolism and thrombosis of unspecified deep veins of right lower extremity: Secondary | ICD-10-CM

## 2014-07-28 DIAGNOSIS — Z7901 Long term (current) use of anticoagulants: Secondary | ICD-10-CM

## 2014-07-28 DIAGNOSIS — C9002 Multiple myeloma in relapse: Secondary | ICD-10-CM

## 2014-07-28 LAB — CBC WITH DIFFERENTIAL/PLATELET
BASO%: 0 % (ref 0.0–2.0)
BASOS ABS: 0 10*3/uL (ref 0.0–0.1)
EOS ABS: 0 10*3/uL (ref 0.0–0.5)
EOS%: 0 % (ref 0.0–7.0)
HCT: 30 % — ABNORMAL LOW (ref 34.8–46.6)
HGB: 9.5 g/dL — ABNORMAL LOW (ref 11.6–15.9)
LYMPH%: 6.1 % — AB (ref 14.0–49.7)
MCH: 30.3 pg (ref 25.1–34.0)
MCHC: 31.7 g/dL (ref 31.5–36.0)
MCV: 95.5 fL (ref 79.5–101.0)
MONO#: 0.2 10*3/uL (ref 0.1–0.9)
MONO%: 5.8 % (ref 0.0–14.0)
NEUT%: 88.1 % — ABNORMAL HIGH (ref 38.4–76.8)
NEUTROS ABS: 3 10*3/uL (ref 1.5–6.5)
PLATELETS: 197 10*3/uL (ref 145–400)
RBC: 3.14 10*6/uL — AB (ref 3.70–5.45)
RDW: 17.3 % — AB (ref 11.2–14.5)
WBC: 3.4 10*3/uL — ABNORMAL LOW (ref 3.9–10.3)
lymph#: 0.2 10*3/uL — ABNORMAL LOW (ref 0.9–3.3)

## 2014-07-28 LAB — COMPREHENSIVE METABOLIC PANEL (CC13)
ALT: 11 U/L (ref 0–55)
AST: 12 U/L (ref 5–34)
Albumin: 3.2 g/dL — ABNORMAL LOW (ref 3.5–5.0)
Alkaline Phosphatase: 53 U/L (ref 40–150)
Anion Gap: 13 mEq/L — ABNORMAL HIGH (ref 3–11)
BILIRUBIN TOTAL: 0.25 mg/dL (ref 0.20–1.20)
BUN: 26.6 mg/dL — AB (ref 7.0–26.0)
CO2: 23 mEq/L (ref 22–29)
Calcium: 9.7 mg/dL (ref 8.4–10.4)
Chloride: 105 mEq/L (ref 98–109)
Creatinine: 1.2 mg/dL — ABNORMAL HIGH (ref 0.6–1.1)
GLUCOSE: 215 mg/dL — AB (ref 70–140)
POTASSIUM: 4.2 meq/L (ref 3.5–5.1)
Sodium: 141 mEq/L (ref 136–145)
Total Protein: 5.8 g/dL — ABNORMAL LOW (ref 6.4–8.3)

## 2014-07-28 LAB — PROTIME-INR
INR: 3.5 (ref 2.00–3.50)
PROTIME: 42 s — AB (ref 10.6–13.4)

## 2014-07-28 LAB — POCT INR: INR: 3.5

## 2014-07-28 NOTE — Patient Instructions (Signed)
Hold coumadin today (11/27) then coumadin 2.5mg  daily. Recheck PT/INR on 08/03/14; Lab at 10:45am, 11:15 MD appmt, infusion at 1:00 and coumadin clinic at 1:15pm.   We will see you during your infusion.

## 2014-07-28 NOTE — Telephone Encounter (Signed)
Pt's daughter [name] called left a message stating that pt was babbling and not making any sense on 11/25.  It stopped that same day and ever since then she has been fine.  Per Dr Vista Mink, no changes or orders right now since symptoms have resolved, let us know if anything changes or symptoms return.  Spoke with pt's daughter Maudie Mercury and patient at the coumadin clinic today.  They verbalized understanding.

## 2014-07-28 NOTE — Progress Notes (Signed)
Pt seen in clinic today INR=3.5 No changes to report Will hold dose today then resume 2.5 mg daily Recheck INR on 08/03/14 at your appmts We will see you during your infusion.

## 2014-08-03 ENCOUNTER — Telehealth: Payer: Self-pay | Admitting: Internal Medicine

## 2014-08-03 ENCOUNTER — Encounter: Payer: Self-pay | Admitting: *Deleted

## 2014-08-03 ENCOUNTER — Encounter: Payer: Self-pay | Admitting: Internal Medicine

## 2014-08-03 ENCOUNTER — Other Ambulatory Visit (HOSPITAL_BASED_OUTPATIENT_CLINIC_OR_DEPARTMENT_OTHER): Payer: Medicare Other

## 2014-08-03 ENCOUNTER — Ambulatory Visit (HOSPITAL_BASED_OUTPATIENT_CLINIC_OR_DEPARTMENT_OTHER): Payer: Medicare Other | Admitting: Internal Medicine

## 2014-08-03 ENCOUNTER — Ambulatory Visit (HOSPITAL_BASED_OUTPATIENT_CLINIC_OR_DEPARTMENT_OTHER): Payer: Medicare Other

## 2014-08-03 ENCOUNTER — Ambulatory Visit: Payer: Medicare Other

## 2014-08-03 VITALS — BP 134/67 | HR 58 | Temp 97.9°F | Resp 18 | Ht <= 58 in | Wt 119.3 lb

## 2014-08-03 DIAGNOSIS — C9000 Multiple myeloma not having achieved remission: Secondary | ICD-10-CM

## 2014-08-03 DIAGNOSIS — Z006 Encounter for examination for normal comparison and control in clinical research program: Secondary | ICD-10-CM

## 2014-08-03 DIAGNOSIS — I82401 Acute embolism and thrombosis of unspecified deep veins of right lower extremity: Secondary | ICD-10-CM

## 2014-08-03 DIAGNOSIS — Z7901 Long term (current) use of anticoagulants: Secondary | ICD-10-CM

## 2014-08-03 DIAGNOSIS — Z5112 Encounter for antineoplastic immunotherapy: Secondary | ICD-10-CM

## 2014-08-03 LAB — CBC WITH DIFFERENTIAL/PLATELET
BASO%: 0.8 % (ref 0.0–2.0)
Basophils Absolute: 0 10*3/uL (ref 0.0–0.1)
EOS ABS: 0 10*3/uL (ref 0.0–0.5)
EOS%: 0.1 % (ref 0.0–7.0)
HEMATOCRIT: 33 % — AB (ref 34.8–46.6)
HEMOGLOBIN: 10.5 g/dL — AB (ref 11.6–15.9)
LYMPH#: 0.2 10*3/uL — AB (ref 0.9–3.3)
LYMPH%: 5.2 % — AB (ref 14.0–49.7)
MCH: 30.1 pg (ref 25.1–34.0)
MCHC: 31.8 g/dL (ref 31.5–36.0)
MCV: 94.7 fL (ref 79.5–101.0)
MONO#: 0.1 10*3/uL (ref 0.1–0.9)
MONO%: 3 % (ref 0.0–14.0)
NEUT%: 90.9 % — AB (ref 38.4–76.8)
NEUTROS ABS: 4 10*3/uL (ref 1.5–6.5)
PLATELETS: 287 10*3/uL (ref 145–400)
RBC: 3.49 10*6/uL — ABNORMAL LOW (ref 3.70–5.45)
RDW: 19.1 % — ABNORMAL HIGH (ref 11.2–14.5)
WBC: 4.4 10*3/uL (ref 3.9–10.3)

## 2014-08-03 LAB — COMPREHENSIVE METABOLIC PANEL (CC13)
ALT: 15 U/L (ref 0–55)
ANION GAP: 13 meq/L — AB (ref 3–11)
AST: 13 U/L (ref 5–34)
Albumin: 3.5 g/dL (ref 3.5–5.0)
Alkaline Phosphatase: 65 U/L (ref 40–150)
BUN: 17.1 mg/dL (ref 7.0–26.0)
CHLORIDE: 104 meq/L (ref 98–109)
CO2: 23 meq/L (ref 22–29)
CREATININE: 0.9 mg/dL (ref 0.6–1.1)
Calcium: 9.8 mg/dL (ref 8.4–10.4)
EGFR: 68 mL/min/{1.73_m2} — AB (ref >90)
GLUCOSE: 160 mg/dL — AB (ref 70–140)
Potassium: 3.6 mEq/L (ref 3.5–5.1)
Sodium: 140 mEq/L (ref 136–145)
Total Bilirubin: 0.32 mg/dL (ref 0.20–1.20)
Total Protein: 6.5 g/dL (ref 6.4–8.3)

## 2014-08-03 LAB — PROTIME-INR
INR: 1.5 — ABNORMAL LOW (ref 2.00–3.50)
PROTIME: 18 s — AB (ref 10.6–13.4)

## 2014-08-03 LAB — LACTATE DEHYDROGENASE (CC13): LDH: 251 U/L — AB (ref 125–245)

## 2014-08-03 LAB — POCT INR: INR: 1.5

## 2014-08-03 MED ORDER — DIPHENHYDRAMINE HCL 25 MG PO CAPS
ORAL_CAPSULE | ORAL | Status: AC
  Filled 2014-08-03: qty 2

## 2014-08-03 MED ORDER — FAMOTIDINE IN NACL 20-0.9 MG/50ML-% IV SOLN
20.0000 mg | Freq: Once | INTRAVENOUS | Status: AC
  Administered 2014-08-03: 20 mg via INTRAVENOUS

## 2014-08-03 MED ORDER — ACETAMINOPHEN 325 MG PO TABS
650.0000 mg | ORAL_TABLET | Freq: Once | ORAL | Status: AC
  Administered 2014-08-03: 650 mg via ORAL

## 2014-08-03 MED ORDER — SODIUM CHLORIDE 0.9 % IV SOLN
Freq: Once | INTRAVENOUS | Status: AC
  Administered 2014-08-03: 13:00:00 via INTRAVENOUS

## 2014-08-03 MED ORDER — DEXAMETHASONE SODIUM PHOSPHATE 10 MG/ML IJ SOLN
8.0000 mg | Freq: Once | INTRAMUSCULAR | Status: AC
  Administered 2014-08-03: 8 mg via INTRAVENOUS

## 2014-08-03 MED ORDER — DIPHENHYDRAMINE HCL 25 MG PO CAPS
50.0000 mg | ORAL_CAPSULE | Freq: Once | ORAL | Status: AC
  Administered 2014-08-03: 50 mg via ORAL

## 2014-08-03 MED ORDER — SODIUM CHLORIDE 0.9 % IV SOLN
10.0000 mg/kg | Freq: Once | INTRAVENOUS | Status: AC
  Administered 2014-08-03: 540 mg via INTRAVENOUS
  Filled 2014-08-03: qty 21.6

## 2014-08-03 MED ORDER — DEXAMETHASONE SODIUM PHOSPHATE 10 MG/ML IJ SOLN
INTRAMUSCULAR | Status: AC
  Filled 2014-08-03: qty 1

## 2014-08-03 MED ORDER — ALPRAZOLAM 0.5 MG PO TABS: 0.5000 mg | ORAL_TABLET | Freq: Two times a day (BID) | ORAL | Status: DC | PRN

## 2014-08-03 MED ORDER — ACETAMINOPHEN 325 MG PO TABS
ORAL_TABLET | ORAL | Status: AC
  Filled 2014-08-03: qty 2

## 2014-08-03 MED ORDER — OXYCODONE HCL 5 MG PO CAPS: 5.0000 mg | ORAL_CAPSULE | Freq: Four times a day (QID) | ORAL | Status: DC | PRN

## 2014-08-03 MED ORDER — FAMOTIDINE IN NACL 20-0.9 MG/50ML-% IV SOLN
INTRAVENOUS | Status: AC
  Filled 2014-08-03: qty 50

## 2014-08-03 NOTE — Patient Instructions (Signed)
[facility] Discharge Instructions for Patients Receiving Chemotherapy  Today you received the following chemotherapy agents Elotuzamab.  To help prevent nausea and vomiting after your treatment, we encourage you to take your nausea medication Compazine 10 mg every 6 hours as needed.   If you develop nausea and vomiting that is not controlled by your nausea medication, call the clinic.   BELOW ARE SYMPTOMS THAT SHOULD BE REPORTED IMMEDIATELY:  *FEVER GREATER THAN 100.5 F  *CHILLS WITH OR WITHOUT FEVER  NAUSEA AND VOMITING THAT IS NOT CONTROLLED WITH YOUR NAUSEA MEDICATION  *UNUSUAL SHORTNESS OF BREATH  *UNUSUAL BRUISING OR BLEEDING  TENDERNESS IN MOUTH AND THROAT WITH OR WITHOUT PRESENCE OF ULCERS  *URINARY PROBLEMS  *BOWEL PROBLEMS  UNUSUAL RASH Items with * indicate a potential emergency and should be followed up as soon as possible.  Feel free to call the clinic you have any questions or concerns. The clinic phone number is [phone].

## 2014-08-03 NOTE — Telephone Encounter (Signed)
Pt confirmed labs/ov per 12/03 POF, gave pt AVS.... KJ, sent msg to add chemo

## 2014-08-03 NOTE — Progress Notes (Signed)
[name] study cycle 5, day 1 Met with patient and her daughter, [name]. Ms. Burling reports some spasms in hands from time to time, but no major or different complaints today. She denies increased weakness, bleeding, fever, SOB, cough or other complaints that are different from her baseline. Her weight is 54.1 kg today. Patient reports taking her Decadron 28 mg po today at 0600. She was seen today by Dr. Julien Nordmann. Patient was informed by Dr. Julien Nordmann that Elotuzumab has been FDA approved for MM.  Patient informed that she will transfer to commercial drug once research department receives more information.  CBC and CMET were reviewed with Dr. Julien Nordmann. Per MD, patient is to resume treatment per protocol. Patient and daughter were given instructions to start back on Lenalidomide today for 21 days per Dr. Julien Nordmann. Reviewed with patient's daughter the schedule for taking Dexamethasone and Lenalidomide per protocol. She was without questions. A calendar with medication instructions was provided. Vial numbers for elotuzumab from [facility] was provided to pharmacist. Communicated to pharmacist, Montel Clock, the elotuzumab dosing information using cycle 5, day 1 weight. This was confirmed by Tyrell Antonio, RN. Signs for infusion administration and reactions were given to Norville Haggard ,RN. Reviewed the use of a filter, timing of pre-meds,and infusion rate. Amy was without questions.

## 2014-08-03 NOTE — Progress Notes (Signed)
[name] is 1.5 today which is below her goal range of 2-3. She has been taking 2.5 mg daily. She denies any missed and/or extra doses. No changes in medications. She reports she has been eating less in general. No bleeding and/or unusual bruising. She reports no other issues with Coumadin at this time.  Plan: Take 2.5 mg daily except 5 mg on Tuesdays and Thursdays. Return to clinic on 08/10/14: lab at 11 am and CC at 11:15 am

## 2014-08-05 NOTE — Progress Notes (Signed)
[facility] [phone]   [phone]  OFFICE PROGRESS NOTE  [name], MD [place] [id number]  DIAGNOSIS:  1. Multiple myeloma diagnosed in September 2009,  2. history of bilateral lower extremity deep vein thrombosis diagnosed in November 2009  3. acute on chronic deep vein thrombosis in the left lower extremity diagnosed in October 2010.   PRIOR THERAPY:  1. status post palliative radiotherapy to the right femur and ischail tuberosity, first was done in December 2011 and the second treatment was completed Jan 08 2011, and the third treatment to the right distal femur completed on 04/30/2011  2. status post palliative radiotherapy to the left proximal femur completed 02/28/2011.  3. Revlimid 25 mg by mouth daily for 21 days every 4 weeks in addition to oral Decadron at 40 mg by mouth on a weekly basis status post 40 cycles.  4. weekly subcutaneous Velcade 1.3 mg/M2 status post 60 cycles, discontinued today secondary to mild disease progression. 5. Pomalyst 4 mg by mouth daily for 21 days every 4 weeks, the patient started the first dose on 12/22/2012 , in addition to Decadron 40 mg weekly. Status post 13 cycles.   CURRENT THERAPY:  1. Chemotherapy according to the [facility] [id number] expanded access treatment protocol with Elotuzumab, lenalidomide and dexamthasone. Status post 4 cycles.  2. Coumadin 5 mg on Mondays and Thursdays and 2.5 mg on all other days. 3. Zometa 4 mg IV given every 3 months.  INTERVAL HISTORY: [name] 78 y.o. female returns to the clinic today for follow-up visit accompanied by her daughter. The patient is tolerating her current treatment fairly well with no significant adverse effect fairly well. The pain on the right arm has improved and no further spasm in that area. She had x-ray of the right elbow that showed no acute lesions but showed lytic lesions of myeloma identified within the  distal humerus consistent with her diagnosis of multiple myeloma. She denied having any significant weight loss or night sweats. She has no nausea or vomiting. The patient denied having any significant chest pain, shortness of breath, cough or hemoptysis.   MEDICAL HISTORY: Past Medical History  Diagnosis Date  . Blood transfusion   . Depression     Mild  . History of chicken pox   . Hyperlipidemia   . Hypertension   . Degenerative joint disease   . Phlebitis   . Multiple myeloma     per Dr. Julien Nordmann, s/p palliative radiation for leg pain 2011 per Dr. Sondra Come  . Deep vein thrombosis of bilateral lower extremities   . Family history of anesthesia complication     DAUGHTER CPR AFTER  . Cardiomyopathy   . Diabetes mellitus     Steroid related  . UTI (urinary tract infection) 09/08/2013  . History of radiation therapy 08/30/13-09/20/13    20 gray to lower lumbar/upper sacrum    ALLERGIES:  has No Known Allergies.  MEDICATIONS:  Current Outpatient Prescriptions  Medication Sig Dispense Refill  . acyclovir (ZOVIRAX) 400 MG tablet TAKE ONE TABLET BY MOUTH TWICE DAILY 60 tablet 0  . ALPRAZolam (XANAX) 0.5 MG tablet Take 1 tablet (0.5 mg total) by mouth 2 (two) times daily as needed for anxiety. 60 tablet 0  . Alum & Mag Hydroxide-Simeth (MAGIC MOUTHWASH W/LIDOCAINE) SOLN Take 5 mLs by mouth 3 (three) times daily as needed for mouth pain (do not swallow, swish and spit 71m TID PRN). 1[place]  mL 0  . Calcium Carbonate-Vitamin D 600-400 MG-UNIT per tablet Take 1 tablet by mouth 3 (three) times daily with meals.      Marland Kitchen dexamethasone (DECADRON) 4 MG tablet Take 10 tab (40 mg) weekly except on days when you receive elotuzumab. On those days take 7 tab (28 mg) between 3 & 24 hours prior to the start of elotuzumab.    Marland Kitchen dextromethorphan-guaiFENesin (MUCINEX DM) 30-600 MG per 12 hr tablet Take 1 tablet by mouth 2 (two) times daily.    . diphenhydrAMINE (SOMINEX) 25 MG tablet Take 25 mg by mouth at  bedtime as needed for sleep.    Marland Kitchen glipiZIDE (GLUCOTROL XL) 2.5 MG 24 hr tablet Take 2.5 mg by mouth daily with breakfast.    . glucose blood (ONE TOUCH ULTRA TEST) test strip Use as instructed 25 each 6  . Lancets (ONETOUCH ULTRASOFT) lancets Test blood sugar once daily or as directed. 100 each 11  . lenalidomide (REVLIMID) 25 MG capsule Take 1 capsule (25 mg total) by mouth daily. 9m daily, repeat every 28 days.  auth # [id number].  Female--not of childbearing potential. 21 capsule 0  . lisinopril-hydrochlorothiazide (PRINZIDE,ZESTORETIC) 20-12.5 MG per tablet TAKE ONE-HALF TABLET BY MOUTH ONCE DAILY 45 tablet 1  . loperamide (IMODIUM A-D) 2 MG tablet Take 1 tablet (2 mg total) by mouth 4 (four) times daily as needed for diarrhea or loose stools.    .Marland Kitchenoxycodone (OXY-IR) 5 MG capsule Take 1 capsule (5 mg total) by mouth every 6 (six) hours as needed for pain (1 1/2 tablets every 6 hours prn pain). 90 capsule 0  . OxyCODONE (OXYCONTIN) 10 mg T12A 12 hr tablet Take 1 tablet (10 mg total) by mouth every 12 (twelve) hours. 60 tablet 0  . potassium chloride SA (K-DUR,KLOR-CON) 20 MEQ tablet Take 1 tablet (20 mEq total) by mouth as directed. 30 tablet 0  . prochlorperazine (COMPAZINE) 10 MG tablet TAKE ONE TABLET BY MOUTH EVERY 6 HOURS AS NEEDED 30 tablet 0  . temazepam (RESTORIL) 30 MG capsule Take 1 capsule (30 mg total) by mouth at bedtime as needed for sleep. 3 capsule 0  . warfarin (COUMADIN) 5 MG tablet Take 2.5-5 mg by mouth daily. Takes 1(522m tablet on mondays, Wednesday. Takes 1/2(2.78m16mtablet on Tuesday,thursday, Friday, Saturday and sunday     No current facility-administered medications for this visit.   Facility-Administered Medications Ordered in Other Visits  Medication Dose Route Frequency Provider Last Rate Last Dose  . Zoledronic Acid (ZOMETA) 4 mg IVPB  4 mg Intravenous Once MohCurt BearsD        SURGICAL HISTORY:  Past Surgical History  Procedure Laterality Date    . Abdominal hysterectomy    . Ankle fracture surgery Left     REVIEW OF SYSTEMS:  A comprehensive review of systems was negative except for: Musculoskeletal: positive for back pain   PHYSICAL EXAMINATION: General appearance: alert, cooperative and no distress Head: Normocephalic, without obvious abnormality, atraumatic Neck: no adenopathy, no JVD, supple, symmetrical, trachea midline and thyroid not enlarged, symmetric, no tenderness/mass/nodules Lymph nodes: Cervical, supraclavicular, and axillary nodes normal. Resp: clear to auscultation bilaterally Back: symmetric, no curvature. ROM normal. No CVA tenderness. Cardio: regular rate and rhythm, S1, S2 normal, no murmur, click, rub or gallop GI: soft, non-tender; bowel sounds normal; no masses,  no organomegaly Extremities: extremities normal, atraumatic, no cyanosis or edema  ECOG PERFORMANCE STATUS: 1 - Symptomatic but completely ambulatory  Blood pressure 134/67, pulse 58,  temperature 97.9 F (36.6 C), temperature source Oral, resp. rate 18, height _0  (1.473 m), weight 119 lb 4.8 oz (54.114 kg).  LABORATORY DATA: Lab Results  Component Value Date   WBC 4.4 08/03/2014   HGB 10.5* 08/03/2014   HCT 33.0* 08/03/2014   MCV 94.7 08/03/2014   PLT 287 08/03/2014      Chemistry      Component Value Date/Time   NA 140 08/03/2014 1109   NA 141 05/18/2014 1308   K 3.6 08/03/2014 1109   K 3.3* 05/18/2014 1308   CL 97 05/18/2014 1308   CL 107 02/14/2013 1311   CO2 23 08/03/2014 1109   CO2 30 05/18/2014 1308   BUN 17.1 08/03/2014 1109   BUN 14 05/18/2014 1308   CREATININE 0.9 08/03/2014 1109   CREATININE 0.99 05/18/2014 1308      Component Value Date/Time   CALCIUM 9.8 08/03/2014 1109   CALCIUM 10.7* 05/18/2014 1308   CALCIUM * 06/13/2008 1605    5.7 CRITICAL RESULT CALLED TO, READ BACK BY AND VERIFIED WITH: JAMIE TRACY,RN [id number] @ [address] (NOTE)  Amended report. Result repeated and verified. CORRECTED ON 10/14  AT 1429: PREVIOUSLY REPORTED AS Result repeated and verified.   ALKPHOS 65 08/03/2014 1109   ALKPHOS 54 05/18/2014 1308   AST 13 08/03/2014 1109   AST 20 05/18/2014 1308   ALT 15 08/03/2014 1109   ALT 17 05/18/2014 1308   BILITOT 0.32 08/03/2014 1109   BILITOT 0.4 05/18/2014 1308       RADIOGRAPHIC STUDIES: Dg Elbow 2 Views Right  08/12/14   CLINICAL DATA:  Right elbow pain for 1 week.  No injury.  EXAM: RIGHT ELBOW - 2 VIEW  COMPARISON:  None.  FINDINGS: Lytic lesions of myeloma are identified within the distal radius. These do not appear significantly changed when compared with 12/04/2011. There is no significant joint effusion. No fracture or subluxation identified.  IMPRESSION: 1. No acute findings. 2. Lytic lesions of myeloma identified within the distal humerus.   Electronically Signed   By: Kerby Moors M.D.   On: 2014-08-12 17:20    ASSESSMENT AND PLAN: this is a very pleasant 78 years old African-American female with history of multiple myeloma currently undergoing systemic chemotherapy according to the BMS [place] 204143 clinical trial with Elotuzumab, Revlimid and dexamethasone. She status post 4 cycles and tolerating her treatment fairly well. I recommended for the patient to continue her current treatment as scheduled. She will start cycle #5 today. She will come back for follow-up visit in 4 weeks after repeating myeloma panel for reevaluation of her disease. She was advised to call immediately if she has any concerning symptoms in the interval. The patient voices understanding of current disease status and treatment options and is in agreement with the current care plan.  All questions were answered. The patient knows to call the clinic with any problems, questions or concerns. We can certainly see the patient much sooner if necessary.  Disclaimer: This note was dictated with voice recognition software. Similar sounding words can inadvertently be transcribed and may not be  corrected upon review.

## 2014-08-10 ENCOUNTER — Ambulatory Visit (HOSPITAL_BASED_OUTPATIENT_CLINIC_OR_DEPARTMENT_OTHER): Payer: Medicare Other | Admitting: Pharmacist

## 2014-08-10 ENCOUNTER — Other Ambulatory Visit: Payer: Self-pay | Admitting: *Deleted

## 2014-08-10 ENCOUNTER — Telehealth: Payer: Self-pay | Admitting: *Deleted

## 2014-08-10 ENCOUNTER — Other Ambulatory Visit (HOSPITAL_BASED_OUTPATIENT_CLINIC_OR_DEPARTMENT_OTHER): Payer: Medicare Other

## 2014-08-10 DIAGNOSIS — I82401 Acute embolism and thrombosis of unspecified deep veins of right lower extremity: Secondary | ICD-10-CM

## 2014-08-10 DIAGNOSIS — Z7901 Long term (current) use of anticoagulants: Secondary | ICD-10-CM

## 2014-08-10 DIAGNOSIS — C9000 Multiple myeloma not having achieved remission: Secondary | ICD-10-CM

## 2014-08-10 DIAGNOSIS — C9002 Multiple myeloma in relapse: Secondary | ICD-10-CM

## 2014-08-10 LAB — CBC WITH DIFFERENTIAL/PLATELET
BASO%: 0.4 % (ref 0.0–2.0)
BASOS ABS: 0 10*3/uL (ref 0.0–0.1)
EOS ABS: 0 10*3/uL (ref 0.0–0.5)
EOS%: 0.4 % (ref 0.0–7.0)
HEMATOCRIT: 35.3 % (ref 34.8–46.6)
HEMOGLOBIN: 10.9 g/dL — AB (ref 11.6–15.9)
LYMPH%: 6.7 % — AB (ref 14.0–49.7)
MCH: 30.2 pg (ref 25.1–34.0)
MCHC: 30.9 g/dL — ABNORMAL LOW (ref 31.5–36.0)
MCV: 97.8 fL (ref 79.5–101.0)
MONO#: 0 10*3/uL — AB (ref 0.1–0.9)
MONO%: 0.7 % (ref 0.0–14.0)
NEUT%: 91.8 % — AB (ref 38.4–76.8)
NEUTROS ABS: 5.1 10*3/uL (ref 1.5–6.5)
PLATELETS: 247 10*3/uL (ref 145–400)
RBC: 3.61 10*6/uL — ABNORMAL LOW (ref 3.70–5.45)
RDW: 17.8 % — ABNORMAL HIGH (ref 11.2–14.5)
WBC: 5.5 10*3/uL (ref 3.9–10.3)
lymph#: 0.4 10*3/uL — ABNORMAL LOW (ref 0.9–3.3)

## 2014-08-10 LAB — COMPREHENSIVE METABOLIC PANEL (CC13)
ALBUMIN: 3.4 g/dL — AB (ref 3.5–5.0)
ALK PHOS: 64 U/L (ref 40–150)
ALT: 21 U/L (ref 0–55)
AST: 15 U/L (ref 5–34)
Anion Gap: 14 mEq/L — ABNORMAL HIGH (ref 3–11)
BUN: 22.4 mg/dL (ref 7.0–26.0)
CHLORIDE: 105 meq/L (ref 98–109)
CO2: 23 meq/L (ref 22–29)
Calcium: 9.8 mg/dL (ref 8.4–10.4)
Creatinine: 1.3 mg/dL — ABNORMAL HIGH (ref 0.6–1.1)
EGFR: 47 mL/min/{1.73_m2} — ABNORMAL LOW (ref >90)
GLUCOSE: 198 mg/dL — AB (ref 70–140)
POTASSIUM: 4.3 meq/L (ref 3.5–5.1)
SODIUM: 142 meq/L (ref 136–145)
TOTAL PROTEIN: 6.2 g/dL — AB (ref 6.4–8.3)
Total Bilirubin: 0.3 mg/dL (ref 0.20–1.20)

## 2014-08-10 LAB — PROTIME-INR
INR: 2.4 (ref 2.00–3.50)
Protime: 28.8 Seconds — ABNORMAL HIGH (ref 10.6–13.4)

## 2014-08-10 LAB — LACTATE DEHYDROGENASE (CC13): LDH: 226 U/L (ref 125–245)

## 2014-08-10 LAB — POCT INR: INR: 2.4

## 2014-08-10 NOTE — Telephone Encounter (Signed)
08/10/2014 Spoke with patient's daughter, [name], re: transitioning elotuzumab over to commercial supply since it was approved by the FDA as mentioned by Dr. Julien Nordmann at last week's visit.  Made an appointment for patient to meet with financial counselor on 12/17 while here for treatment.  She understands the patient must bring prof of income for [facility] to use for patient assistance program.  Raquel, [facility], will process patient assistance forms and have MD sign.  Patient's daughter expressed appreciation.  She also confirmed that her mother took 40 mg Dexamethasone this morning at 0800.

## 2014-08-10 NOTE — Progress Notes (Signed)
INR = 2.4   Goal 2-3 INR within goal range. She has had no complications of anticoagulation. No new medications. She has an existing lab appt 12/28 and we will see her at that time. She will continue Coumadin 2.5 mg daily except 5 mg on Tuesdays and Thursdays.  We will recheck PT/INR on 08/28/14; Lab at 11:15 am, 11:30 Coumadin [place], PharmD

## 2014-08-10 NOTE — Telephone Encounter (Signed)
Per staff message and POF I have scheduled appts. Advised scheduler of appts. JMW  

## 2014-08-16 ENCOUNTER — Ambulatory Visit: Payer: Medicare Other

## 2014-08-16 ENCOUNTER — Other Ambulatory Visit: Payer: Self-pay | Admitting: *Deleted

## 2014-08-16 ENCOUNTER — Other Ambulatory Visit: Payer: Medicare Other

## 2014-08-16 DIAGNOSIS — C9000 Multiple myeloma not having achieved remission: Secondary | ICD-10-CM

## 2014-08-16 MED ORDER — LENALIDOMIDE 25 MG PO CAPS: 25.0000 mg | ORAL_CAPSULE | Freq: Every day | ORAL | Status: DC

## 2014-08-16 NOTE — Telephone Encounter (Signed)
Received fax request for revlimid refill.  Script signed by Dr. Julien Nordmann, prescriber survey done and e-scribed to [employer].

## 2014-08-17 ENCOUNTER — Encounter: Payer: Self-pay | Admitting: *Deleted

## 2014-08-17 ENCOUNTER — Other Ambulatory Visit: Payer: Self-pay | Admitting: Internal Medicine

## 2014-08-17 ENCOUNTER — Ambulatory Visit (HOSPITAL_BASED_OUTPATIENT_CLINIC_OR_DEPARTMENT_OTHER): Payer: Medicare Other

## 2014-08-17 ENCOUNTER — Other Ambulatory Visit (HOSPITAL_BASED_OUTPATIENT_CLINIC_OR_DEPARTMENT_OTHER): Payer: Medicare Other

## 2014-08-17 ENCOUNTER — Other Ambulatory Visit: Payer: Self-pay | Admitting: *Deleted

## 2014-08-17 ENCOUNTER — Ambulatory Visit: Payer: Medicare Other

## 2014-08-17 DIAGNOSIS — C9000 Multiple myeloma not having achieved remission: Secondary | ICD-10-CM

## 2014-08-17 DIAGNOSIS — Z006 Encounter for examination for normal comparison and control in clinical research program: Secondary | ICD-10-CM

## 2014-08-17 DIAGNOSIS — C9002 Multiple myeloma in relapse: Secondary | ICD-10-CM

## 2014-08-17 DIAGNOSIS — Z5112 Encounter for antineoplastic immunotherapy: Secondary | ICD-10-CM

## 2014-08-17 LAB — CBC WITH DIFFERENTIAL/PLATELET
BASO%: 0.2 % (ref 0.0–2.0)
BASOS ABS: 0 10*3/uL (ref 0.0–0.1)
EOS ABS: 0.1 10*3/uL (ref 0.0–0.5)
EOS%: 2.5 % (ref 0.0–7.0)
HCT: 34.4 % — ABNORMAL LOW (ref 34.8–46.6)
HGB: 10.9 g/dL — ABNORMAL LOW (ref 11.6–15.9)
LYMPH#: 0.4 10*3/uL — AB (ref 0.9–3.3)
LYMPH%: 8.4 % — ABNORMAL LOW (ref 14.0–49.7)
MCH: 30.6 pg (ref 25.1–34.0)
MCHC: 31.7 g/dL (ref 31.5–36.0)
MCV: 96.6 fL (ref 79.5–101.0)
MONO#: 0.1 10*3/uL (ref 0.1–0.9)
MONO%: 2.3 % (ref 0.0–14.0)
NEUT%: 86.6 % — AB (ref 38.4–76.8)
NEUTROS ABS: 4.5 10*3/uL (ref 1.5–6.5)
Platelets: 206 10*3/uL (ref 145–400)
RBC: 3.56 10*6/uL — AB (ref 3.70–5.45)
RDW: 17.2 % — AB (ref 11.2–14.5)
WBC: 5.1 10*3/uL (ref 3.9–10.3)

## 2014-08-17 LAB — COMPREHENSIVE METABOLIC PANEL (CC13)
ALK PHOS: 60 U/L (ref 40–150)
ALT: 18 U/L (ref 0–55)
AST: 16 U/L (ref 5–34)
Albumin: 3.5 g/dL (ref 3.5–5.0)
Anion Gap: 13 mEq/L — ABNORMAL HIGH (ref 3–11)
BILIRUBIN TOTAL: 0.46 mg/dL (ref 0.20–1.20)
BUN: 17.7 mg/dL (ref 7.0–26.0)
CO2: 24 mEq/L (ref 22–29)
CREATININE: 1.2 mg/dL — AB (ref 0.6–1.1)
Calcium: 9.9 mg/dL (ref 8.4–10.4)
Chloride: 101 mEq/L (ref 98–109)
EGFR: 53 mL/min/{1.73_m2} — ABNORMAL LOW (ref >90)
Glucose: 189 mg/dl — ABNORMAL HIGH (ref 70–140)
Potassium: 4 mEq/L (ref 3.5–5.1)
Sodium: 139 mEq/L (ref 136–145)
Total Protein: 6.3 g/dL — ABNORMAL LOW (ref 6.4–8.3)

## 2014-08-17 MED ORDER — ONDANSETRON 8 MG/NS 50 ML IVPB
INTRAVENOUS | Status: AC
  Filled 2014-08-17: qty 8

## 2014-08-17 MED ORDER — DIPHENHYDRAMINE HCL 25 MG PO CAPS
50.0000 mg | ORAL_CAPSULE | Freq: Once | ORAL | Status: AC
  Administered 2014-08-17: 50 mg via ORAL

## 2014-08-17 MED ORDER — DIPHENHYDRAMINE HCL 25 MG PO CAPS
ORAL_CAPSULE | ORAL | Status: AC
  Filled 2014-08-17: qty 2

## 2014-08-17 MED ORDER — SODIUM CHLORIDE 0.9 % IV SOLN
Freq: Once | INTRAVENOUS | Status: AC
  Administered 2014-08-17: 13:00:00 via INTRAVENOUS

## 2014-08-17 MED ORDER — ONDANSETRON HCL 8 MG PO TABS
ORAL_TABLET | ORAL | Status: AC
  Filled 2014-08-17: qty 1

## 2014-08-17 MED ORDER — OXYCODONE HCL 5 MG PO CAPS: 5.0000 mg | ORAL_CAPSULE | Freq: Four times a day (QID) | ORAL | Status: DC | PRN

## 2014-08-17 MED ORDER — FAMOTIDINE IN NACL 20-0.9 MG/50ML-% IV SOLN
20.0000 mg | Freq: Once | INTRAVENOUS | Status: AC
  Administered 2014-08-17: 20 mg via INTRAVENOUS

## 2014-08-17 MED ORDER — DEXAMETHASONE SODIUM PHOSPHATE 10 MG/ML IJ SOLN
8.0000 mg | Freq: Once | INTRAMUSCULAR | Status: AC
  Administered 2014-08-17: 8 mg via INTRAVENOUS

## 2014-08-17 MED ORDER — ACETAMINOPHEN 325 MG PO TABS
ORAL_TABLET | ORAL | Status: AC
Start: 2014-08-17 — End: 2014-08-17
  Filled 2014-08-17: qty 2

## 2014-08-17 MED ORDER — DEXAMETHASONE SODIUM PHOSPHATE 10 MG/ML IJ SOLN
INTRAMUSCULAR | Status: AC
  Filled 2014-08-17: qty 1

## 2014-08-17 MED ORDER — ACETAMINOPHEN 325 MG PO TABS
650.0000 mg | ORAL_TABLET | Freq: Once | ORAL | Status: AC
  Administered 2014-08-17: 650 mg via ORAL

## 2014-08-17 MED ORDER — INV-ELOTUZUMAB CHEMO INJECTION 400MG BMS CA204-143
10.0000 mg/kg | Freq: Once | INTRAMUSCULAR | Status: AC
  Administered 2014-08-17: 540 mg via INTRAVENOUS
  Filled 2014-08-17: qty 21.6

## 2014-08-17 MED ORDER — FAMOTIDINE IN NACL 20-0.9 MG/50ML-% IV SOLN
INTRAVENOUS | Status: AC
  Filled 2014-08-17: qty 50

## 2014-08-17 NOTE — Progress Notes (Unsigned)
Oral premedication administration time exceeds 90 minute window per research protocol. Marcellus Scott, research RN consulted and Dr. Julien Nordmann notified. OK to proceed with treatment per MD and research team.

## 2014-08-17 NOTE — Patient Instructions (Signed)
[facility] Discharge Instructions for Patients Receiving Chemotherapy  Today you received the following chemotherapy agents: Elotuzumab.  To help prevent nausea and vomiting after your treatment, we encourage you to take your nausea medication as prescribed.   If you develop nausea and vomiting that is not controlled by your nausea medication, call the clinic.   BELOW ARE SYMPTOMS THAT SHOULD BE REPORTED IMMEDIATELY:  *FEVER GREATER THAN 100.5 F  *CHILLS WITH OR WITHOUT FEVER  NAUSEA AND VOMITING THAT IS NOT CONTROLLED WITH YOUR NAUSEA MEDICATION  *UNUSUAL SHORTNESS OF BREATH  *UNUSUAL BRUISING OR BLEEDING  TENDERNESS IN MOUTH AND THROAT WITH OR WITHOUT PRESENCE OF ULCERS  *URINARY PROBLEMS  *BOWEL PROBLEMS  UNUSUAL RASH Items with * indicate a potential emergency and should be followed up as soon as possible.  Feel free to call the clinic should you have any questions or concerns. The clinic phone number is [phone].

## 2014-08-17 NOTE — Progress Notes (Signed)
cycle 5, day 15 [name] study 08/17/2014 1140 Met with patient and her daughter, [name]. Confirmed with patient and her daughter that patient took her Dexamethasone 28 mg at 6:30 am today. Also confirmed pt is taking her Revlimid daily as prescribed. Patient reports no new symptoms today and denies any worsening of symptoms since her MD visit on 08/03/14.  Her daughters are with her and they concur.  Patient's CBC-D and CMET were shown to Dr. Julien Nordmann. MD stated patient is cleared to receive elotuzumab treatment today and to continue with Revlimid. Spoke with infusion RN, Rosalio Macadamia, re: treatment instructions. Provided and reviewed the research signs with pre-med instructions, including, length of infusion, filter to use, and drug reaction information. Treating RN was without questions.Pharmacist was given the vial assignments from [facility].Reviewed the medications for cycle 5, day 22 with patient and her daughter.Patient has a copy of December study medication schedule for cycle 5 and this was reviewed with patient and her daughter. They were without questions. Patient also met with the financial counselor today and signed paperwork to transition over to commercial Elotuzumab.  FC said patient would be notified once this is approved.  This RN requested, FC, Raquel, to notify this RN once approval has been made so that the research study treatment end process can take place.

## 2014-08-17 NOTE — Progress Notes (Signed)
1435 This RN was notified by infusion RN , Cira Rue, that the patient took oral Tylenol and Benadryl 93 minutes prior to start of Elotuzumab infusion.  This RN, along with Lanora Manis, RN, spoke with Dr. Julien Nordmann and informed him of above.  Per Dr. Julien Nordmann, "continue with Elotuzumab infusion".

## 2014-08-21 ENCOUNTER — Encounter: Payer: Self-pay | Admitting: Internal Medicine

## 2014-08-21 NOTE — Progress Notes (Signed)
Per Ratasha with [facility]- patient approved for $11,000.00 pg [id number] 05/23/14-08/21/15. This asst is with Elotuzumab. I advised Baker Janus and [name]

## 2014-08-23 ENCOUNTER — Encounter: Payer: Self-pay | Admitting: *Deleted

## 2014-08-23 NOTE — Progress Notes (Signed)
08/23/2014 Received approval for commercial drug, Elotuzumab, from [employer], Raquel.  Spoke with Dr. Julien Nordmann who is aware patient is eligible for commercial drug and will not be receiving research supplied Elotuzumab.  Spoke with Montel Clock, Pharm. D who will build commercial Elotuzumab treatment plan prior to patient's next treatment which is due 08/31/14.  Patient is supposed to have MD visit on 08/31/14 prior to treatment.  Reviewed above with Dr. Julien Nordmann and with [name], RN who will also see patient on 08/31/14 to ensure no issues surrounding above plan.  Research treatment plan for [name] was discontinued and verified with [name], RN.

## 2014-08-23 NOTE — Progress Notes (Signed)
08/23/2014 Received approval for commercial drug, Elotuzumab, from [employer], Raquel. Spoke with Dr. Julien Nordmann who is aware patient is eligible for commercial drug and will not be receiving research supplied Elotuzumab. Spoke with Montel Clock, Pharm. D who will build commercial Elotuzumab treatment plan prior to patient's next treatment which is due 08/31/14. Patient is supposed to have MD visit on 08/31/14 prior to treatment. Reviewed above with Dr. Julien Nordmann and with [name], RN who will also see patient on 08/31/14 to ensure no issues surrounding above plan. Research treatment plan for [name] was discontinued and verified with [name], RN.

## 2014-08-24 ENCOUNTER — Other Ambulatory Visit: Payer: Medicare Other

## 2014-08-28 ENCOUNTER — Ambulatory Visit (HOSPITAL_BASED_OUTPATIENT_CLINIC_OR_DEPARTMENT_OTHER): Payer: Self-pay | Admitting: Pharmacist

## 2014-08-28 ENCOUNTER — Other Ambulatory Visit (HOSPITAL_BASED_OUTPATIENT_CLINIC_OR_DEPARTMENT_OTHER): Payer: Medicare Other

## 2014-08-28 DIAGNOSIS — Z7901 Long term (current) use of anticoagulants: Secondary | ICD-10-CM

## 2014-08-28 DIAGNOSIS — C9000 Multiple myeloma not having achieved remission: Secondary | ICD-10-CM

## 2014-08-28 DIAGNOSIS — I82401 Acute embolism and thrombosis of unspecified deep veins of right lower extremity: Secondary | ICD-10-CM

## 2014-08-28 LAB — COMPREHENSIVE METABOLIC PANEL (CC13)
ALT: 17 U/L (ref 0–55)
ANION GAP: 11 meq/L (ref 3–11)
AST: 13 U/L (ref 5–34)
Albumin: 3.4 g/dL — ABNORMAL LOW (ref 3.5–5.0)
Alkaline Phosphatase: 60 U/L (ref 40–150)
BUN: 14.7 mg/dL (ref 7.0–26.0)
CALCIUM: 9.6 mg/dL (ref 8.4–10.4)
CO2: 28 mEq/L (ref 22–29)
Chloride: 103 mEq/L (ref 98–109)
Creatinine: 1.2 mg/dL — ABNORMAL HIGH (ref 0.6–1.1)
EGFR: 52 mL/min/{1.73_m2} — ABNORMAL LOW (ref >90)
GLUCOSE: 140 mg/dL (ref 70–140)
Potassium: 3.2 mEq/L — ABNORMAL LOW (ref 3.5–5.1)
SODIUM: 142 meq/L (ref 136–145)
TOTAL PROTEIN: 6.1 g/dL — AB (ref 6.4–8.3)
Total Bilirubin: 0.34 mg/dL (ref 0.20–1.20)

## 2014-08-28 LAB — CBC WITH DIFFERENTIAL/PLATELET
BASO%: 0.6 % (ref 0.0–2.0)
Basophils Absolute: 0 10*3/uL (ref 0.0–0.1)
EOS ABS: 0.1 10*3/uL (ref 0.0–0.5)
EOS%: 1.7 % (ref 0.0–7.0)
HCT: 34 % — ABNORMAL LOW (ref 34.8–46.6)
HEMOGLOBIN: 10.7 g/dL — AB (ref 11.6–15.9)
LYMPH#: 0.3 10*3/uL — AB (ref 0.9–3.3)
LYMPH%: 6.9 % — AB (ref 14.0–49.7)
MCH: 30.8 pg (ref 25.1–34.0)
MCHC: 31.6 g/dL (ref 31.5–36.0)
MCV: 97.6 fL (ref 79.5–101.0)
MONO#: 0.7 10*3/uL (ref 0.1–0.9)
MONO%: 18 % — ABNORMAL HIGH (ref 0.0–14.0)
NEUT#: 3 10*3/uL (ref 1.5–6.5)
NEUT%: 72.8 % (ref 38.4–76.8)
Platelets: 253 10*3/uL (ref 145–400)
RBC: 3.48 10*6/uL — AB (ref 3.70–5.45)
RDW: 18.7 % — ABNORMAL HIGH (ref 11.2–14.5)
WBC: 4.1 10*3/uL (ref 3.9–10.3)

## 2014-08-28 LAB — PROTIME-INR
INR: 3 (ref 2.00–3.50)
Protime: 36 Seconds — ABNORMAL HIGH (ref 10.6–13.4)

## 2014-08-28 LAB — POCT INR: INR: 3

## 2014-08-28 LAB — LACTATE DEHYDROGENASE (CC13): LDH: 222 U/L (ref 125–245)

## 2014-08-28 NOTE — Progress Notes (Signed)
INR = 3   Goal 2-3 INR is within goal range. No complications of anticoagulation noted. No dietary or medication changes. Patient has been experiencing more pain recently, no other complaints. Her next treatment is 08/31/14. She will continue to take Coumadin 2.5 mg daily except 5 mg on Tuesdays and Thursdays.  We will recheck PT/INR on 09/14/14; Lab at 10:15 am, [place] at 10:45 and treatment at 12:45. We will see her in the treatment area.  Theone Murdoch, PharmD

## 2014-08-28 NOTE — Progress Notes (Signed)
Quick Note:  Call patient with the result and order K dur 20 meq po qd X 7 ______

## 2014-08-30 ENCOUNTER — Other Ambulatory Visit: Payer: Self-pay | Admitting: *Deleted

## 2014-08-30 LAB — IGG, IGA, IGM
IGM, SERUM: 43 mg/dL — AB (ref 52–322)
IgA: 29 mg/dL — ABNORMAL LOW (ref 69–380)
IgG (Immunoglobin G), Serum: 349 mg/dL — ABNORMAL LOW (ref 690–1700)

## 2014-08-30 LAB — KAPPA/LAMBDA LIGHT CHAINS
KAPPA LAMBDA RATIO: 0 — AB (ref 0.26–1.65)
Kappa free light chain: 0.16 mg/dL — ABNORMAL LOW (ref 0.33–1.94)
LAMBDA FREE LGHT CHN: 129 mg/dL — AB (ref 0.57–2.63)

## 2014-08-30 LAB — BETA 2 MICROGLOBULIN, SERUM: Beta-2 Microglobulin: 3.3 mg/L — ABNORMAL HIGH (ref <=2.51)

## 2014-08-31 ENCOUNTER — Other Ambulatory Visit: Payer: Medicare Other

## 2014-08-31 ENCOUNTER — Other Ambulatory Visit (HOSPITAL_BASED_OUTPATIENT_CLINIC_OR_DEPARTMENT_OTHER): Payer: Medicare Other

## 2014-08-31 ENCOUNTER — Ambulatory Visit (HOSPITAL_BASED_OUTPATIENT_CLINIC_OR_DEPARTMENT_OTHER): Payer: Medicare Other | Admitting: Internal Medicine

## 2014-08-31 ENCOUNTER — Ambulatory Visit: Payer: Medicare Other

## 2014-08-31 ENCOUNTER — Telehealth: Payer: Self-pay | Admitting: Internal Medicine

## 2014-08-31 ENCOUNTER — Telehealth: Payer: Self-pay | Admitting: *Deleted

## 2014-08-31 ENCOUNTER — Encounter: Payer: Self-pay | Admitting: Internal Medicine

## 2014-08-31 VITALS — BP 131/63 | HR 75 | Temp 97.6°F | Resp 18 | Ht <= 58 in | Wt 117.8 lb

## 2014-08-31 DIAGNOSIS — C9002 Multiple myeloma in relapse: Secondary | ICD-10-CM

## 2014-08-31 DIAGNOSIS — M79676 Pain in unspecified toe(s): Secondary | ICD-10-CM

## 2014-08-31 DIAGNOSIS — E876 Hypokalemia: Secondary | ICD-10-CM

## 2014-08-31 DIAGNOSIS — C9000 Multiple myeloma not having achieved remission: Secondary | ICD-10-CM

## 2014-08-31 LAB — COMPREHENSIVE METABOLIC PANEL (CC13)
ALBUMIN: 3.3 g/dL — AB (ref 3.5–5.0)
ALT: 17 U/L (ref 0–55)
AST: 16 U/L (ref 5–34)
Alkaline Phosphatase: 65 U/L (ref 40–150)
Anion Gap: 12 mEq/L — ABNORMAL HIGH (ref 3–11)
BUN: 19.7 mg/dL (ref 7.0–26.0)
CO2: 26 mEq/L (ref 22–29)
Calcium: 9.3 mg/dL (ref 8.4–10.4)
Chloride: 103 mEq/L (ref 98–109)
Creatinine: 1 mg/dL (ref 0.6–1.1)
EGFR: 62 mL/min/{1.73_m2} — ABNORMAL LOW (ref >90)
GLUCOSE: 208 mg/dL — AB (ref 70–140)
Potassium: 3.6 mEq/L (ref 3.5–5.1)
Sodium: 141 mEq/L (ref 136–145)
TOTAL PROTEIN: 6.1 g/dL — AB (ref 6.4–8.3)
Total Bilirubin: 0.34 mg/dL (ref 0.20–1.20)

## 2014-08-31 LAB — CBC WITH DIFFERENTIAL/PLATELET
BASO%: 0.8 % (ref 0.0–2.0)
Basophils Absolute: 0 10*3/uL (ref 0.0–0.1)
EOS%: 0 % (ref 0.0–7.0)
Eosinophils Absolute: 0 10*3/uL (ref 0.0–0.5)
HCT: 32.1 % — ABNORMAL LOW (ref 34.8–46.6)
HEMOGLOBIN: 10.2 g/dL — AB (ref 11.6–15.9)
LYMPH#: 0.3 10*3/uL — AB (ref 0.9–3.3)
LYMPH%: 7.3 % — ABNORMAL LOW (ref 14.0–49.7)
MCH: 30.8 pg (ref 25.1–34.0)
MCHC: 31.6 g/dL (ref 31.5–36.0)
MCV: 97.6 fL (ref 79.5–101.0)
MONO#: 0.1 10*3/uL (ref 0.1–0.9)
MONO%: 2.9 % (ref 0.0–14.0)
NEUT%: 89 % — ABNORMAL HIGH (ref 38.4–76.8)
NEUTROS ABS: 3.4 10*3/uL (ref 1.5–6.5)
Platelets: 259 10*3/uL (ref 145–400)
RBC: 3.29 10*6/uL — AB (ref 3.70–5.45)
RDW: 18.3 % — ABNORMAL HIGH (ref 11.2–14.5)
WBC: 3.8 10*3/uL — ABNORMAL LOW (ref 3.9–10.3)

## 2014-08-31 MED ORDER — POTASSIUM CHLORIDE CRYS ER 20 MEQ PO TBCR: 20.0000 meq | EXTENDED_RELEASE_TABLET | ORAL | Status: DC

## 2014-08-31 MED ORDER — LIDOCAINE-PRILOCAINE 2.5-2.5 % EX CREA: 1.0000 "application " | TOPICAL_CREAM | CUTANEOUS | Status: DC | PRN

## 2014-08-31 NOTE — Telephone Encounter (Signed)
Per Dr Vista Mink, okay to hold coumadin x 4 days p/t port placement.  Informed IR.

## 2014-08-31 NOTE — Telephone Encounter (Signed)
-----   Message from Curt Bears, MD sent at 08/28/2014  5:52 PM EST ----- Call patient with the result and order K dur 20 meq po qd X 7

## 2014-08-31 NOTE — Progress Notes (Signed)
[name] [phone]   [phone]  OFFICE PROGRESS NOTE  Lindsay Stain, MD [place] [id number]  DIAGNOSIS:  1. Multiple myeloma diagnosed in September 2009,  2. history of bilateral lower extremity deep vein thrombosis diagnosed in November 2009  3. acute on chronic deep vein thrombosis in the left lower extremity diagnosed in October 2010.   PRIOR THERAPY:  1. status post palliative radiotherapy to the right femur and ischail tuberosity, first was done in December 2011 and the second treatment was completed Jan 08 2011, and the third treatment to the right distal femur completed on 04/30/2011  2. status post palliative radiotherapy to the left proximal femur completed 02/28/2011.  3. Revlimid 25 mg by mouth daily for 21 days every 4 weeks in addition to oral Decadron at 40 mg by mouth on a weekly basis status post 40 cycles.  4. weekly subcutaneous Velcade 1.3 mg/M2 status post 60 cycles, discontinued today secondary to mild disease progression. 5. Pomalyst 4 mg by mouth daily for 21 days every 4 weeks, the patient started the first dose on 12/22/2012 , in addition to Decadron 40 mg weekly. Status post 13 cycles.  6. Chemotherapy according to the [facility] [id number] expanded access treatment protocol with Elotuzumab, lenalidomide and dexamthasone. Status post 5 cycles, discontinued today secondary to disease progression.   CURRENT THERAPY:  1. Systemic chemotherapy with Carfilzomib, Cytoxan and Decadron. First cycle 09/05/2014. 2. Coumadin followed by the Coumadin clinic at the [facility].  3. Zometa 4 mg IV given every 3 months.  INTERVAL HISTORY: [name] 78 y.o. female returns to the clinic today for follow-up visit accompanied by her 2 daughters. The patient is tolerating her current treatment fairly well with no significant adverse effect fairly well. She has some pain on the right second toe but no  significant infection or swelling. She denied having any significant weight loss or night sweats. She has no nausea or vomiting. The patient denied having any significant chest pain, shortness of breath, cough or hemoptysis. The patient had repeat myeloma panel performed recently and she is here for evaluation and discussion of her lab results.  MEDICAL HISTORY: Past Medical History  Diagnosis Date  . Blood transfusion   . Depression     Mild  . History of chicken pox   . Hyperlipidemia   . Hypertension   . Degenerative joint disease   . Phlebitis   . Multiple myeloma     per Dr. Julien Nordmann, s/p palliative radiation for leg pain 2011 per Dr. Sondra Come  . Deep vein thrombosis of bilateral lower extremities   . Family history of anesthesia complication     DAUGHTER CPR AFTER  . Cardiomyopathy   . Diabetes mellitus     Steroid related  . UTI (urinary tract infection) 09/08/2013  . History of radiation therapy 08/30/13-09/20/13    20 gray to lower lumbar/upper sacrum    ALLERGIES:  has No Known Allergies.  MEDICATIONS:  Current Outpatient Prescriptions  Medication Sig Dispense Refill  . acyclovir (ZOVIRAX) 400 MG tablet TAKE ONE TABLET BY MOUTH TWICE DAILY 60 tablet 0  . ALPRAZolam (XANAX) 0.5 MG tablet Take 1 tablet (0.5 mg total) by mouth 2 (two) times daily as needed for anxiety. 60 tablet 0  . Alum & Mag Hydroxide-Simeth (MAGIC MOUTHWASH W/LIDOCAINE) SOLN Take 5 mLs by mouth 3 (three) times daily as needed for mouth pain (do not swallow, swish and spit  63m TID PRN). 120 mL 0  . Calcium Carbonate-Vitamin D 600-400 MG-UNIT per tablet Take 1 tablet by mouth 3 (three) times daily with meals.      .Marland Kitchendexamethasone (DECADRON) 4 MG tablet Take 10 tab (40 mg) weekly except on days when you receive elotuzumab. On those days take 7 tab (28 mg) between 3 & 24 hours prior to the start of elotuzumab.    .Marland Kitchendextromethorphan-guaiFENesin (MUCINEX DM) 30-600 MG per 12 hr tablet Take 1 tablet by mouth 2  (two) times daily.    . diphenhydrAMINE (SOMINEX) 25 MG tablet Take 25 mg by mouth at bedtime as needed for sleep.    .Marland KitchenglipiZIDE (GLUCOTROL XL) 2.5 MG 24 hr tablet Take 2.5 mg by mouth daily with breakfast.    . glucose blood (ONE TOUCH ULTRA TEST) test strip Use as instructed 25 each 6  . Lancets (ONETOUCH ULTRASOFT) lancets Test blood sugar once daily or as directed. 100 each 11  . lenalidomide (REVLIMID) 25 MG capsule Take 1 capsule (25 mg total) by mouth daily. For 21 days, then rest for 7 days. repeat every 28 days. 21 capsule 0  . lisinopril-hydrochlorothiazide (PRINZIDE,ZESTORETIC) 20-12.5 MG per tablet TAKE ONE-HALF TABLET BY MOUTH ONCE DAILY 45 tablet 1  . loperamide (IMODIUM A-D) 2 MG tablet Take 1 tablet (2 mg total) by mouth 4 (four) times daily as needed for diarrhea or loose stools.    .Marland Kitchenoxycodone (OXY-IR) 5 MG capsule Take 1 capsule (5 mg total) by mouth every 6 (six) hours as needed for pain (1 1/2 tablets every 6 hours prn pain). 90 capsule 0  . OxyCODONE (OXYCONTIN) 10 mg T12A 12 hr tablet Take 1 tablet (10 mg total) by mouth every 12 (twelve) hours. 60 tablet 0  . potassium chloride SA (K-DUR,KLOR-CON) 20 MEQ tablet Take 1 tablet (20 mEq total) by mouth as directed. 30 tablet 0  . prochlorperazine (COMPAZINE) 10 MG tablet TAKE ONE TABLET BY MOUTH EVERY 6 HOURS AS NEEDED 30 tablet 0  . temazepam (RESTORIL) 30 MG capsule Take 1 capsule (30 mg total) by mouth at bedtime as needed for sleep. 3 capsule 0  . warfarin (COUMADIN) 5 MG tablet Take 2.5-5 mg by mouth daily. Takes 1(533m tablet on mondays, Wednesday. Takes 1/2(2.3m13mtablet on Tuesday,thursday, Friday, Saturday and sunday     No current facility-administered medications for this visit.   Facility-Administered Medications Ordered in Other Visits  Medication Dose Route Frequency Provider Last Rate Last Dose  . Zoledronic Acid (ZOMETA) 4 mg IVPB  4 mg Intravenous Once MohCurt BearsD        SURGICAL HISTORY:  Past  Surgical History  Procedure Laterality Date  . Abdominal hysterectomy    . Ankle fracture surgery Left     REVIEW OF SYSTEMS:  Constitutional: positive for fatigue Eyes: negative Ears, nose, mouth, throat, and face: negative Respiratory: negative Cardiovascular: negative Gastrointestinal: negative Genitourinary:negative Integument/breast: negative Hematologic/lymphatic: negative Musculoskeletal:positive for bone pain Neurological: negative Behavioral/Psych: negative Endocrine: negative Allergic/Immunologic: negative   PHYSICAL EXAMINATION: General appearance: alert, cooperative and no distress Head: Normocephalic, without obvious abnormality, atraumatic Neck: no adenopathy, no JVD, supple, symmetrical, trachea midline and thyroid not enlarged, symmetric, no tenderness/mass/nodules Lymph nodes: Cervical, supraclavicular, and axillary nodes normal. Resp: clear to auscultation bilaterally Back: symmetric, no curvature. ROM normal. No CVA tenderness. Cardio: regular rate and rhythm, S1, S2 normal, no murmur, click, rub or gallop GI: soft, non-tender; bowel sounds normal; no masses,  no organomegaly Extremities: extremities normal,  atraumatic, no cyanosis or edema Neurologic: Alert and oriented X 3, normal strength and tone. Normal symmetric reflexes. Normal coordination and gait  ECOG PERFORMANCE STATUS: 1 - Symptomatic but completely ambulatory  There were no vitals taken for this visit.  LABORATORY DATA: Lab Results  Component Value Date   WBC 3.8* 08/31/2014   HGB 10.2* 08/31/2014   HCT 32.1* 08/31/2014   MCV 97.6 08/31/2014   PLT 259 08/31/2014      Chemistry      Component Value Date/Time   NA 142 08/28/2014 1134   NA 141 05/18/2014 1308   K 3.2* 08/28/2014 1134   K 3.3* 05/18/2014 1308   CL 97 05/18/2014 1308   CL 107 02/14/2013 1311   CO2 28 08/28/2014 1134   CO2 30 05/18/2014 1308   BUN 14.7 08/28/2014 1134   BUN 14 05/18/2014 1308   CREATININE 1.2*  08/28/2014 1134   CREATININE 0.99 05/18/2014 1308      Component Value Date/Time   CALCIUM 9.6 08/28/2014 1134   CALCIUM 10.7* 05/18/2014 1308   CALCIUM * 06/13/2008 1605    5.7 CRITICAL RESULT CALLED TO, READ BACK BY AND VERIFIED WITH: JAMIE TRACY,RN [id number] @ [address] (NOTE)  Amended report. Result repeated and verified. CORRECTED ON 10/14 AT 1429: PREVIOUSLY REPORTED AS Result repeated and verified.   ALKPHOS 60 08/28/2014 1134   ALKPHOS 54 05/18/2014 1308   AST 13 08/28/2014 1134   AST 20 05/18/2014 1308   ALT 17 08/28/2014 1134   ALT 17 05/18/2014 1308   BILITOT 0.34 08/28/2014 1134   BILITOT 0.4 05/18/2014 1308     Myeloma panel: Beta-2 microglobulin 3.30, free kappa Light chain 0.16, free lambda light chain 129.00 with kappa/lambda ratio of 0.00. IgG 349, IgA 29 and IgM 43.   RADIOGRAPHIC STUDIES: No results found.  ASSESSMENT AND PLAN: this is a very pleasant 78 years old African-American female with history of multiple myeloma status post several chemotherapy regimens lastly including systemic chemotherapy according to the [facility] [place] 204143 clinical trial with Elotuzumab, Revlimid and dexamethasone. She status post 5 cycles and tolerating her treatment fairly well. This was discontinued secondary to disease progression. Unfortunately the recent myeloma panel showed significant increase in the free lambda light chain. I discussed the lab result with the patient and her daughters. I recommended for her to continue her current treatment with Elotuzumab, Revlimid and dexamethasone. I discussed with the patient other treatment options including systemic chemotherapy was Carfilzomib, Cytoxan and dexamethasone as salvage therapy. I discussed with her the adverse effect of this treatment. The patient would like to proceed with treatment as planned. She is expected to start cycle #1 of this treatment on 09/05/2014. She will come back for follow-up visit in 2 weeks for  reevaluation of her disease and management of any adverse effect of her treatment. For the right second toe pain, and the patient will continue with her current pain medication for now. She was advised to call immediately if she has any worsening of the pain or swelling. She was advised to call immediately if she has any concerning symptoms in the interval. The patient voices understanding of current disease status and treatment options and is in agreement with the current care plan.  All questions were answered. The patient knows to call the clinic with any problems, questions or concerns. We can certainly see the patient much sooner if necessary.  Disclaimer: This note was dictated with voice recognition software. Similar sounding words  can inadvertently be transcribed and may not be corrected upon review.

## 2014-08-31 NOTE — Telephone Encounter (Signed)
Informed pt's daughters at f/u visit today.  They verbalized understanding

## 2014-08-31 NOTE — Telephone Encounter (Signed)
gv adn printed appt sched and avs for Jan 2016.sed added tx.

## 2014-09-05 ENCOUNTER — Other Ambulatory Visit (HOSPITAL_BASED_OUTPATIENT_CLINIC_OR_DEPARTMENT_OTHER): Payer: Medicare Other

## 2014-09-05 ENCOUNTER — Other Ambulatory Visit: Payer: Self-pay | Admitting: Radiology

## 2014-09-05 ENCOUNTER — Ambulatory Visit (HOSPITAL_BASED_OUTPATIENT_CLINIC_OR_DEPARTMENT_OTHER): Payer: Medicare Other

## 2014-09-05 DIAGNOSIS — C9002 Multiple myeloma in relapse: Secondary | ICD-10-CM | POA: Diagnosis not present

## 2014-09-05 DIAGNOSIS — C9000 Multiple myeloma not having achieved remission: Secondary | ICD-10-CM

## 2014-09-05 DIAGNOSIS — Z5112 Encounter for antineoplastic immunotherapy: Secondary | ICD-10-CM | POA: Diagnosis not present

## 2014-09-05 LAB — COMPREHENSIVE METABOLIC PANEL (CC13)
ALBUMIN: 3.4 g/dL — AB (ref 3.5–5.0)
ALT: 21 U/L (ref 0–55)
ANION GAP: 13 meq/L — AB (ref 3–11)
AST: 21 U/L (ref 5–34)
Alkaline Phosphatase: 64 U/L (ref 40–150)
BUN: 12.7 mg/dL (ref 7.0–26.0)
CO2: 29 meq/L (ref 22–29)
Calcium: 9.5 mg/dL (ref 8.4–10.4)
Chloride: 102 mEq/L (ref 98–109)
Creatinine: 0.9 mg/dL (ref 0.6–1.1)
EGFR: 77 mL/min/{1.73_m2} — AB (ref >90)
GLUCOSE: 84 mg/dL (ref 70–140)
POTASSIUM: 3.4 meq/L — AB (ref 3.5–5.1)
SODIUM: 144 meq/L (ref 136–145)
TOTAL PROTEIN: 6.2 g/dL — AB (ref 6.4–8.3)
Total Bilirubin: 0.33 mg/dL (ref 0.20–1.20)

## 2014-09-05 LAB — CBC WITH DIFFERENTIAL/PLATELET
BASO%: 0.8 % (ref 0.0–2.0)
Basophils Absolute: 0 10*3/uL (ref 0.0–0.1)
EOS%: 1.8 % (ref 0.0–7.0)
Eosinophils Absolute: 0.1 10*3/uL (ref 0.0–0.5)
HEMATOCRIT: 33.8 % — AB (ref 34.8–46.6)
HEMOGLOBIN: 10.7 g/dL — AB (ref 11.6–15.9)
LYMPH#: 0.5 10*3/uL — AB (ref 0.9–3.3)
LYMPH%: 14.4 % (ref 14.0–49.7)
MCH: 30.8 pg (ref 25.1–34.0)
MCHC: 31.6 g/dL (ref 31.5–36.0)
MCV: 97.4 fL (ref 79.5–101.0)
MONO#: 0.3 10*3/uL (ref 0.1–0.9)
MONO%: 8.6 % (ref 0.0–14.0)
NEUT%: 74.4 % (ref 38.4–76.8)
NEUTROS ABS: 2.8 10*3/uL (ref 1.5–6.5)
Platelets: 234 10*3/uL (ref 145–400)
RBC: 3.47 10*6/uL — ABNORMAL LOW (ref 3.70–5.45)
RDW: 18.3 % — AB (ref 11.2–14.5)
WBC: 3.8 10*3/uL — ABNORMAL LOW (ref 3.9–10.3)

## 2014-09-05 MED ORDER — ZOLEDRONIC ACID 4 MG/5ML IV CONC
4.0000 mg | Freq: Once | INTRAVENOUS | Status: AC
  Administered 2014-09-05: 4 mg via INTRAVENOUS
  Filled 2014-09-05: qty 5

## 2014-09-05 MED ORDER — DEXAMETHASONE SODIUM PHOSPHATE 10 MG/ML IJ SOLN
10.0000 mg | Freq: Once | INTRAMUSCULAR | Status: AC
  Administered 2014-09-05: 10 mg via INTRAVENOUS

## 2014-09-05 MED ORDER — DEXAMETHASONE SODIUM PHOSPHATE 10 MG/ML IJ SOLN
INTRAMUSCULAR | Status: AC
  Filled 2014-09-05: qty 1

## 2014-09-05 MED ORDER — DEXTROSE 5 % IV SOLN
20.0000 mg/m2 | Freq: Once | INTRAVENOUS | Status: AC
  Administered 2014-09-05: 30 mg via INTRAVENOUS
  Filled 2014-09-05: qty 15

## 2014-09-05 MED ORDER — ONDANSETRON 8 MG/50ML IVPB (CHCC)
8.0000 mg | Freq: Once | INTRAVENOUS | Status: AC
  Administered 2014-09-05: 8 mg via INTRAVENOUS

## 2014-09-05 MED ORDER — ONDANSETRON 8 MG/NS 50 ML IVPB
INTRAVENOUS | Status: AC
  Filled 2014-09-05: qty 8

## 2014-09-05 MED ORDER — SODIUM CHLORIDE 0.9 % IV SOLN
Freq: Once | INTRAVENOUS | Status: AC
  Administered 2014-09-05: 16:00:00 via INTRAVENOUS

## 2014-09-05 MED ORDER — SODIUM CHLORIDE 0.9 % IV SOLN
300.0000 mg/m2 | Freq: Once | INTRAVENOUS | Status: AC
  Administered 2014-09-05: 440 mg via INTRAVENOUS
  Filled 2014-09-05: qty 22

## 2014-09-05 NOTE — Progress Notes (Signed)
Discharged in no distress.  Tolerated first day of treatment 1 well.

## 2014-09-05 NOTE — Patient Instructions (Addendum)
[facility] Discharge Instructions for Patients Receiving Chemotherapy  Today you received the following chemotherapy agents Cytoxan, Kyprolis, Zometa.  To help prevent nausea and vomiting after your treatment, we encourage you to take your nausea medication compazine 10 mg as ordered by provider.   If you develop nausea and vomiting that is not controlled by your nausea medication, call the clinic.   BELOW ARE SYMPTOMS THAT SHOULD BE REPORTED IMMEDIATELY:  *FEVER GREATER THAN 100.5 F  *CHILLS WITH OR WITHOUT FEVER  NAUSEA AND VOMITING THAT IS NOT CONTROLLED WITH YOUR NAUSEA MEDICATION  *UNUSUAL SHORTNESS OF BREATH  *UNUSUAL BRUISING OR BLEEDING  TENDERNESS IN MOUTH AND THROAT WITH OR WITHOUT PRESENCE OF ULCERS  *URINARY PROBLEMS  *BOWEL PROBLEMS  UNUSUAL RASH Items with * indicate a potential emergency and should be followed up as soon as possible.  Feel free to call the clinic you have any questions or concerns. The clinic phone number is [phone].   Carfilzomib injection What is this medicine? CARFILZOMIB (kar FILZ oh mib) is a chemotherapy drug that works by slowing or stopping cancer cell growth. This medicine is used to treat multiple myeloma. This medicine may be used for other purposes; ask your health care provider or pharmacist if you have questions. COMMON BRAND NAME(S): KYPROLIS What should I tell my health care provider before I take this medicine? They need to know if you have any of these conditions: -heart disease -irregular heartbeat -liver disease -lung or breathing disease -an unusual or allergic reaction to carfilzomib, or other medicines, foods, dyes, or preservatives -pregnant or trying to get pregnant -breast-feeding How should I use this medicine? This medicine is for injection or infusion into a vein. It is given by a health care professional in a hospital or clinic setting. Talk to your pediatrician regarding the use of  this medicine in children. Special care may be needed. Overdosage: If you think you've taken too much of this medicine contact a poison control center or emergency room at once. Overdosage: If you think you have taken too much of this medicine contact a poison control center or emergency room at once. NOTE: This medicine is only for you. Do not share this medicine with others. What if I miss a dose? It is important not to miss your dose. Call your doctor or health care professional if you are unable to keep an appointment. What may interact with this medicine? Interactions are not expected. Give your health care provider a list of all the medicines, herbs, non-prescription drugs, or dietary supplements you use. Also tell them if you smoke, drink alcohol, or use illegal drugs. Some items may interact with your medicine. This list may not describe all possible interactions. Give your health care provider a list of all the medicines, herbs, non-prescription drugs, or dietary supplements you use. Also tell them if you smoke, drink alcohol, or use illegal drugs. Some items may interact with your medicine. What should I watch for while using this medicine? Your condition will be monitored carefully while you are receiving this medicine. Report any side effects. Continue your course of treatment even though you feel ill unless your doctor tells you to stop. Call your doctor or health care professional for advice if you get a fever, chills or sore throat, or other symptoms of a cold or flu. Do not treat yourself. Try to avoid being around people who are sick. Do not become pregnant while taking this medicine. Women should inform their doctor  if they wish to become pregnant or think they might be pregnant. There is a potential for serious side effects to an unborn child. Talk to your health care professional or pharmacist for more information. Do not breast-feed an infant while taking this medicine. Check with  your doctor or health care professional if you get an attack of severe diarrhea, nausea and vomiting, or if you sweat a lot. The loss of too much body fluid can make it dangerous for you to take this medicine. You may get dizzy. Do not drive, use machinery, or do anything that needs mental alertness until you know how this medicine affects you. Do not stand or sit up quickly, especially if you are an older patient. This reduces the risk of dizzy or fainting spells. What side effects may I notice from receiving this medicine? Side effects that you should report to your doctor or health care professional as soon as possible: -allergic reactions like skin rash, itching or hives, swelling of the face, lips, or tongue -breathing problems -chest pain or palpitationschest tightness -cough -dark urine -dizziness -feeling faint or lightheaded -fever or chills -general ill feeling or flu-like symptoms -light-colored stools -palpitations -right upper belly pain -swelling of the legs or ankles -unusual bleeding or bruising -unusually weak or tired -yellowing of the eyes or skin Side effects that usually do not require medical attention (Report these to your doctor or health care professional if they continue or are bothersome.): -diarrhea -headache -nausea, vomiting -tiredness This list may not describe all possible side effects. Call your doctor for medical advice about side effects. You may report side effects to FDA at 1-800-FDA-1088. Where should I keep my medicine? This drug is given in a hospital or clinic and will not be stored at home. NOTE: This sheet is a summary. It may not cover all possible information. If you have questions about this medicine, talk to your doctor, pharmacist, or health care provider.  2015, Elsevier/Gold Standard. (2012-02-06 17:02:29) Cyclophosphamide injection What is this medicine? CYCLOPHOSPHAMIDE (sye kloe FOSS fa mide) is a chemotherapy drug. It slows the  growth of cancer cells. This medicine is used to treat many types of cancer like lymphoma, myeloma, leukemia, breast cancer, and ovarian cancer, to name a few. This medicine may be used for other purposes; ask your health care provider or pharmacist if you have questions. COMMON BRAND NAME(S): Cytoxan, Neosar What should I tell my health care provider before I take this medicine? They need to know if you have any of these conditions: -blood disorders -history of other chemotherapy -infection -kidney disease -liver disease -recent or ongoing radiation therapy -tumors in the bone marrow -an unusual or allergic reaction to cyclophosphamide, other chemotherapy, other medicines, foods, dyes, or preservatives -pregnant or trying to get pregnant -breast-feeding How should I use this medicine? This drug is usually given as an injection into a vein or muscle or by infusion into a vein. It is administered in a hospital or clinic by a specially trained health care professional. Talk to your pediatrician regarding the use of this medicine in children. Special care may be needed. Overdosage: If you think you have taken too much of this medicine contact a poison control center or emergency room at once. NOTE: This medicine is only for you. Do not share this medicine with others. What if I miss a dose? It is important not to miss your dose. Call your doctor or health care professional if you are unable to keep an appointment.  What may interact with this medicine? This medicine may interact with the following medications: -amiodarone -amphotericin B -azathioprine -certain antiviral medicines for HIV or AIDS such as protease inhibitors (e.g., indinavir, ritonavir) and zidovudine -certain blood pressure medications such as benazepril, captopril, enalapril, fosinopril, lisinopril, moexipril, monopril, perindopril, quinapril, ramipril, trandolapril -certain cancer medications such as anthracyclines (e.g.,  daunorubicin, doxorubicin), busulfan, cytarabine, paclitaxel, pentostatin, tamoxifen, trastuzumab -certain diuretics such as chlorothiazide, chlorthalidone, hydrochlorothiazide, indapamide, metolazone -certain medicines that treat or prevent blood clots like warfarin -certain muscle relaxants such as succinylcholine -cyclosporine -etanercept -indomethacin -medicines to increase blood counts like filgrastim, pegfilgrastim, sargramostim -medicines used as general anesthesia -metronidazole -natalizumab This list may not describe all possible interactions. Give your health care provider a list of all the medicines, herbs, non-prescription drugs, or dietary supplements you use. Also tell them if you smoke, drink alcohol, or use illegal drugs. Some items may interact with your medicine. What should I watch for while using this medicine? Visit your doctor for checks on your progress. This drug may make you feel generally unwell. This is not uncommon, as chemotherapy can affect healthy cells as well as cancer cells. Report any side effects. Continue your course of treatment even though you feel ill unless your doctor tells you to stop. Drink water or other fluids as directed. Urinate often, even at night. In some cases, you may be given additional medicines to help with side effects. Follow all directions for their use. Call your doctor or health care professional for advice if you get a fever, chills or sore throat, or other symptoms of a cold or flu. Do not treat yourself. This drug decreases your body's ability to fight infections. Try to avoid being around people who are sick. This medicine may increase your risk to bruise or bleed. Call your doctor or health care professional if you notice any unusual bleeding. Be careful brushing and flossing your teeth or using a toothpick because you may get an infection or bleed more easily. If you have any dental work done, tell your dentist you are receiving  this medicine. You may get drowsy or dizzy. Do not drive, use machinery, or do anything that needs mental alertness until you know how this medicine affects you. Do not become pregnant while taking this medicine or for 1 year after stopping it. Women should inform their doctor if they wish to become pregnant or think they might be pregnant. Men should not father a child while taking this medicine and for 4 months after stopping it. There is a potential for serious side effects to an unborn child. Talk to your health care professional or pharmacist for more information. Do not breast-feed an infant while taking this medicine. This medicine may interfere with the ability to have a child. This medicine has caused ovarian failure in some women. This medicine has caused reduced sperm counts in some men. You should talk with your doctor or health care professional if you are concerned about your fertility. If you are going to have surgery, tell your doctor or health care professional that you have taken this medicine. What side effects may I notice from receiving this medicine? Side effects that you should report to your doctor or health care professional as soon as possible: -allergic reactions like skin rash, itching or hives, swelling of the face, lips, or tongue -low blood counts - this medicine may decrease the number of white blood cells, red blood cells and platelets. You may be at increased risk for  infections and bleeding. -signs of infection - fever or chills, cough, sore throat, pain or difficulty passing urine -signs of decreased platelets or bleeding - bruising, pinpoint red spots on the skin, black, tarry stools, blood in the urine -signs of decreased red blood cells - unusually weak or tired, fainting spells, lightheadedness -breathing problems -dark urine -dizziness -palpitations -swelling of the ankles, feet, hands -trouble passing urine or change in the amount of urine -weight  gain -yellowing of the eyes or skin Side effects that usually do not require medical attention (report to your doctor or health care professional if they continue or are bothersome): -changes in nail or skin color -hair loss -missed menstrual periods -mouth sores -nausea, vomiting This list may not describe all possible side effects. Call your doctor for medical advice about side effects. You may report side effects to FDA at 1-800-FDA-1088. Where should I keep my medicine? This drug is given in a hospital or clinic and will not be stored at home. NOTE: This sheet is a summary. It may not cover all possible information. If you have questions about this medicine, talk to your doctor, pharmacist, or health care provider.  2015, Elsevier/Gold Standard. (2012-07-02 16:22:58)

## 2014-09-05 NOTE — Progress Notes (Signed)
500 ml bag of 0.9 % Normal Saline started with pre-meds and zometa hung as secondary bags.  Primary rate set at 500 ml/hr with 250 ml volume to infuse.    C/O left second toe discomfort and has removed tennis shoe at 1605.  Daughter reports the toe has looked purple.  This nurse visually assessed the toe and noted inner aspect of toenail may be starting to be ingrown.  Suggested soak feet and immediately clip toenails and suggest urgent care or podiatrist to clip toenails due to her being on coumadin.  Suggested nails be clipped and filed evenly on a weekly or regular basis and to wear well fitted shoes.

## 2014-09-06 ENCOUNTER — Ambulatory Visit (HOSPITAL_BASED_OUTPATIENT_CLINIC_OR_DEPARTMENT_OTHER): Payer: Medicare Other

## 2014-09-06 ENCOUNTER — Other Ambulatory Visit: Payer: Self-pay | Admitting: *Deleted

## 2014-09-06 ENCOUNTER — Ambulatory Visit (HOSPITAL_BASED_OUTPATIENT_CLINIC_OR_DEPARTMENT_OTHER): Payer: Medicare Other | Admitting: Nurse Practitioner

## 2014-09-06 ENCOUNTER — Other Ambulatory Visit: Payer: Self-pay | Admitting: Internal Medicine

## 2014-09-06 DIAGNOSIS — L03032 Cellulitis of left toe: Secondary | ICD-10-CM | POA: Diagnosis not present

## 2014-09-06 DIAGNOSIS — C9000 Multiple myeloma not having achieved remission: Secondary | ICD-10-CM

## 2014-09-06 DIAGNOSIS — L03012 Cellulitis of left finger: Secondary | ICD-10-CM

## 2014-09-06 DIAGNOSIS — Z5112 Encounter for antineoplastic immunotherapy: Secondary | ICD-10-CM | POA: Diagnosis not present

## 2014-09-06 DIAGNOSIS — IMO0002 Reserved for concepts with insufficient information to code with codable children: Secondary | ICD-10-CM | POA: Insufficient documentation

## 2014-09-06 MED ORDER — OXYCODONE HCL 5 MG PO CAPS: 5.0000 mg | ORAL_CAPSULE | Freq: Four times a day (QID) | ORAL | Status: DC | PRN

## 2014-09-06 MED ORDER — ONDANSETRON 8 MG/NS 50 ML IVPB
INTRAVENOUS | Status: AC
  Filled 2014-09-06: qty 8

## 2014-09-06 MED ORDER — SODIUM CHLORIDE 0.9 % IV SOLN
Freq: Once | INTRAVENOUS | Status: AC
  Administered 2014-09-06: 16:00:00 via INTRAVENOUS

## 2014-09-06 MED ORDER — DEXAMETHASONE SODIUM PHOSPHATE 10 MG/ML IJ SOLN
INTRAMUSCULAR | Status: AC
  Filled 2014-09-06: qty 1

## 2014-09-06 MED ORDER — SODIUM CHLORIDE 0.9 % IV SOLN
Freq: Once | INTRAVENOUS | Status: AC
  Administered 2014-09-06: 15:00:00 via INTRAVENOUS

## 2014-09-06 MED ORDER — DEXTROSE 5 % IV SOLN
20.0000 mg/m2 | Freq: Once | INTRAVENOUS | Status: AC
  Administered 2014-09-06: 30 mg via INTRAVENOUS
  Filled 2014-09-06: qty 15

## 2014-09-06 MED ORDER — ONDANSETRON 8 MG/50ML IVPB (CHCC)
8.0000 mg | Freq: Once | INTRAVENOUS | Status: AC
  Administered 2014-09-06: 8 mg via INTRAVENOUS

## 2014-09-06 MED ORDER — CEPHALEXIN 500 MG PO CAPS: 500.0000 mg | ORAL_CAPSULE | Freq: Four times a day (QID) | ORAL | Status: DC

## 2014-09-06 MED ORDER — DEXAMETHASONE SODIUM PHOSPHATE 10 MG/ML IJ SOLN
10.0000 mg | Freq: Once | INTRAMUSCULAR | Status: AC
  Administered 2014-09-06: 10 mg via INTRAVENOUS

## 2014-09-06 NOTE — Patient Instructions (Signed)
[name] Discharge Instructions for Patients Receiving Chemotherapy  Today you received the following chemotherapy agent: Kyprolis  To help prevent nausea and vomiting after your treatment, we encourage you to take your nausea medication as prescribed.    If you develop nausea and vomiting that is not controlled by your nausea medication, call the clinic.   BELOW ARE SYMPTOMS THAT SHOULD BE REPORTED IMMEDIATELY:  *FEVER GREATER THAN 100.5 F  *CHILLS WITH OR WITHOUT FEVER  NAUSEA AND VOMITING THAT IS NOT CONTROLLED WITH YOUR NAUSEA MEDICATION  *UNUSUAL SHORTNESS OF BREATH  *UNUSUAL BRUISING OR BLEEDING  TENDERNESS IN MOUTH AND THROAT WITH OR WITHOUT PRESENCE OF ULCERS  *URINARY PROBLEMS  *BOWEL PROBLEMS  UNUSUAL RASH Items with * indicate a potential emergency and should be followed up as soon as possible.  Feel free to call the clinic you have any questions or concerns. The clinic phone number is [phone].

## 2014-09-06 NOTE — Progress Notes (Signed)
Upon arrival, patient reporting pain to left foot 2nd toe. Previous RN recommended foot soaks, but this has not relieved pain. Selena Lesser, NP requested to assess.   NP to call in ABX and refer patient to podiatrist for evaluation.

## 2014-09-07 ENCOUNTER — Ambulatory Visit (HOSPITAL_COMMUNITY)
Admission: RE | Admit: 2014-09-07 | Discharge: 2014-09-07 | Disposition: A | Discharge status: Home / Self Care | Payer: Medicare Other | Source: Ambulatory Visit | Attending: Internal Medicine | Admitting: Internal Medicine

## 2014-09-07 ENCOUNTER — Encounter: Payer: Self-pay | Admitting: *Deleted

## 2014-09-07 ENCOUNTER — Other Ambulatory Visit: Payer: Medicare Other

## 2014-09-07 ENCOUNTER — Other Ambulatory Visit: Payer: Self-pay

## 2014-09-07 ENCOUNTER — Other Ambulatory Visit: Payer: Self-pay | Admitting: Internal Medicine

## 2014-09-07 ENCOUNTER — Encounter (HOSPITAL_COMMUNITY): Payer: Self-pay

## 2014-09-07 ENCOUNTER — Encounter: Payer: Self-pay | Admitting: Nurse Practitioner

## 2014-09-07 ENCOUNTER — Telehealth: Payer: Self-pay | Admitting: *Deleted

## 2014-09-07 ENCOUNTER — Emergency Department (HOSPITAL_COMMUNITY)
Admission: EM | Admit: 2014-09-07 | Discharge: 2014-09-07 | Disposition: A | Discharge status: Home / Self Care | Payer: Medicare Other | Attending: Emergency Medicine | Admitting: Emergency Medicine

## 2014-09-07 ENCOUNTER — Telehealth: Payer: Self-pay | Admitting: Family Medicine

## 2014-09-07 ENCOUNTER — Emergency Department (HOSPITAL_COMMUNITY): Payer: Medicare Other

## 2014-09-07 DIAGNOSIS — F329 Major depressive disorder, single episode, unspecified: Secondary | ICD-10-CM | POA: Diagnosis not present

## 2014-09-07 DIAGNOSIS — C9000 Multiple myeloma not having achieved remission: Secondary | ICD-10-CM | POA: Diagnosis not present

## 2014-09-07 DIAGNOSIS — Z7901 Long term (current) use of anticoagulants: Secondary | ICD-10-CM | POA: Diagnosis not present

## 2014-09-07 DIAGNOSIS — E119 Type 2 diabetes mellitus without complications: Secondary | ICD-10-CM | POA: Diagnosis not present

## 2014-09-07 DIAGNOSIS — I10 Essential (primary) hypertension: Secondary | ICD-10-CM | POA: Diagnosis not present

## 2014-09-07 DIAGNOSIS — Z538 Procedure and treatment not carried out for other reasons: Secondary | ICD-10-CM | POA: Insufficient documentation

## 2014-09-07 DIAGNOSIS — R001 Bradycardia, unspecified: Secondary | ICD-10-CM | POA: Diagnosis not present

## 2014-09-07 DIAGNOSIS — Z86718 Personal history of other venous thrombosis and embolism: Secondary | ICD-10-CM | POA: Diagnosis not present

## 2014-09-07 DIAGNOSIS — Z87891 Personal history of nicotine dependence: Secondary | ICD-10-CM | POA: Diagnosis not present

## 2014-09-07 DIAGNOSIS — Z87440 Personal history of urinary (tract) infections: Secondary | ICD-10-CM | POA: Diagnosis not present

## 2014-09-07 DIAGNOSIS — Z79891 Long term (current) use of opiate analgesic: Secondary | ICD-10-CM | POA: Diagnosis not present

## 2014-09-07 DIAGNOSIS — Z9221 Personal history of antineoplastic chemotherapy: Secondary | ICD-10-CM | POA: Insufficient documentation

## 2014-09-07 DIAGNOSIS — Z8619 Personal history of other infectious and parasitic diseases: Secondary | ICD-10-CM | POA: Insufficient documentation

## 2014-09-07 DIAGNOSIS — Z923 Personal history of irradiation: Secondary | ICD-10-CM | POA: Insufficient documentation

## 2014-09-07 DIAGNOSIS — M1990 Unspecified osteoarthritis, unspecified site: Secondary | ICD-10-CM | POA: Diagnosis not present

## 2014-09-07 DIAGNOSIS — E785 Hyperlipidemia, unspecified: Secondary | ICD-10-CM | POA: Insufficient documentation

## 2014-09-07 DIAGNOSIS — Z79899 Other long term (current) drug therapy: Secondary | ICD-10-CM | POA: Insufficient documentation

## 2014-09-07 DIAGNOSIS — Z8673 Personal history of transient ischemic attack (TIA), and cerebral infarction without residual deficits: Secondary | ICD-10-CM | POA: Insufficient documentation

## 2014-09-07 DIAGNOSIS — Z8781 Personal history of (healed) traumatic fracture: Secondary | ICD-10-CM | POA: Diagnosis not present

## 2014-09-07 DIAGNOSIS — J811 Chronic pulmonary edema: Secondary | ICD-10-CM | POA: Diagnosis not present

## 2014-09-07 LAB — CBC WITH DIFFERENTIAL/PLATELET
BASOS PCT: 0 % (ref 0–1)
Basophils Absolute: 0 10*3/uL (ref 0.0–0.1)
Basophils Absolute: 0 10*3/uL (ref 0.0–0.1)
Basophils Relative: 0 % (ref 0–1)
EOS ABS: 0 10*3/uL (ref 0.0–0.7)
EOS PCT: 0 % (ref 0–5)
Eosinophils Absolute: 0 10*3/uL (ref 0.0–0.7)
Eosinophils Relative: 0 % (ref 0–5)
HCT: 29.8 % — ABNORMAL LOW (ref 36.0–46.0)
HEMATOCRIT: 30.5 % — AB (ref 36.0–46.0)
HEMOGLOBIN: 9.6 g/dL — AB (ref 12.0–15.0)
Hemoglobin: 9.2 g/dL — ABNORMAL LOW (ref 12.0–15.0)
LYMPHS ABS: 0.2 10*3/uL — AB (ref 0.7–4.0)
LYMPHS PCT: 3 % — AB (ref 12–46)
Lymphocytes Relative: 2 % — ABNORMAL LOW (ref 12–46)
Lymphs Abs: 0.1 10*3/uL — ABNORMAL LOW (ref 0.7–4.0)
MCH: 31 pg (ref 26.0–34.0)
MCH: 30.4 pg (ref 26.0–34.0)
MCHC: 31.5 g/dL (ref 30.0–36.0)
MCHC: 30.9 g/dL (ref 30.0–36.0)
MCV: 98.4 fL (ref 78.0–100.0)
MCV: 98.3 fL (ref 78.0–100.0)
MONO ABS: 0.1 10*3/uL (ref 0.1–1.0)
MONO ABS: 0.1 10*3/uL (ref 0.1–1.0)
MONOS PCT: 1 % — AB (ref 3–12)
MONOS PCT: 2 % — AB (ref 3–12)
Neutro Abs: 5.9 10*3/uL (ref 1.7–7.7)
Neutro Abs: 5.2 10*3/uL (ref 1.7–7.7)
Neutrophils Relative %: 96 % — ABNORMAL HIGH (ref 43–77)
Neutrophils Relative %: 96 % — ABNORMAL HIGH (ref 43–77)
Platelets: 172 10*3/uL (ref 150–400)
Platelets: 158 10*3/uL (ref 150–400)
RBC: 3.1 MIL/uL — AB (ref 3.87–5.11)
RBC: 3.03 MIL/uL — ABNORMAL LOW (ref 3.87–5.11)
RDW: 17.1 % — ABNORMAL HIGH (ref 11.5–15.5)
RDW: 17 % — ABNORMAL HIGH (ref 11.5–15.5)
WBC: 6.2 10*3/uL (ref 4.0–10.5)
WBC: 5.5 10*3/uL (ref 4.0–10.5)

## 2014-09-07 LAB — APTT: APTT: 26 s (ref 24–37)

## 2014-09-07 LAB — BASIC METABOLIC PANEL
Anion gap: 7 (ref 5–15)
BUN: 28 mg/dL — ABNORMAL HIGH (ref 6–23)
CO2: 23 mmol/L (ref 19–32)
CREATININE: 1.12 mg/dL — AB (ref 0.50–1.10)
Calcium: 8.8 mg/dL (ref 8.4–10.5)
Chloride: 109 mEq/L (ref 96–112)
GFR calc Af Amer: 54 mL/min — ABNORMAL LOW (ref >90)
GFR calc non Af Amer: 46 mL/min — ABNORMAL LOW (ref >90)
GLUCOSE: 208 mg/dL — AB (ref 70–99)
Potassium: 4 mmol/L (ref 3.5–5.1)
Sodium: 139 mmol/L (ref 135–145)

## 2014-09-07 LAB — PROTIME-INR
INR: 1.2 (ref 0.00–1.49)
INR: 1.25 (ref 0.00–1.49)
PROTHROMBIN TIME: 15.8 s — AB (ref 11.6–15.2)
Prothrombin Time: 15.3 seconds — ABNORMAL HIGH (ref 11.6–15.2)

## 2014-09-07 LAB — MAGNESIUM: MAGNESIUM: 1.9 mg/dL (ref 1.5–2.5)

## 2014-09-07 LAB — GLUCOSE, CAPILLARY: GLUCOSE-CAPILLARY: 167 mg/dL — AB (ref 70–99)

## 2014-09-07 LAB — TROPONIN I: Troponin I: 0.03 ng/mL (ref <0.031)

## 2014-09-07 MED ORDER — MIDAZOLAM HCL 2 MG/2ML IJ SOLN
INTRAMUSCULAR | Status: AC
  Filled 2014-09-07: qty 4

## 2014-09-07 MED ORDER — CEFAZOLIN SODIUM-DEXTROSE 2-3 GM-% IV SOLR
INTRAVENOUS | Status: AC
  Administered 2014-09-07: 2 g via INTRAVENOUS
  Filled 2014-09-07: qty 50

## 2014-09-07 MED ORDER — SODIUM CHLORIDE 0.9 % IV SOLN
INTRAVENOUS | Status: DC
  Administered 2014-09-07: 12:00:00 via INTRAVENOUS

## 2014-09-07 MED ORDER — LIDOCAINE HCL 1 % IJ SOLN
INTRAMUSCULAR | Status: DC
Start: 2014-09-07 — End: 2014-09-08
  Filled 2014-09-07: qty 20

## 2014-09-07 MED ORDER — CEFAZOLIN SODIUM-DEXTROSE 2-3 GM-% IV SOLR
2.0000 g | INTRAVENOUS | Status: AC
  Administered 2014-09-07: 2 g via INTRAVENOUS

## 2014-09-07 MED ORDER — FENTANYL CITRATE 0.05 MG/ML IJ SOLN
INTRAMUSCULAR | Status: AC
  Filled 2014-09-07: qty 4

## 2014-09-07 MED ORDER — LABETALOL HCL 5 MG/ML IV SOLN
INTRAVENOUS | Status: AC
  Filled 2014-09-07: qty 4

## 2014-09-07 MED ORDER — LABETALOL HCL 5 MG/ML IV SOLN
20.0000 mg | Freq: Once | INTRAVENOUS | Status: AC
  Administered 2014-09-07: 20 mg via INTRAVENOUS

## 2014-09-07 MED ORDER — HEPARIN SOD (PORK) LOCK FLUSH 100 UNIT/ML IV SOLN
INTRAVENOUS | Status: AC
  Filled 2014-09-07: qty 5

## 2014-09-07 NOTE — Progress Notes (Signed)
This RN faxed labs, demographics and last office note to [facility]. Fax # is [phone]. Received faxed verification.

## 2014-09-07 NOTE — Telephone Encounter (Signed)
No adverse effect from chemo. Requested RN to review how to use EMLA cream and saran wrap/press and seal

## 2014-09-07 NOTE — Assessment & Plan Note (Signed)
Patient initially thought that she had an ingrown toenail to her left second toe.  On closer exam-it does appear that patient has a mild to moderate paronychia instead.  Area is tender with palpation on exam.  Since patient has been holding Coumadin since this past Saturday, 09/02/2014 in preparation for Port-A-Cath placement tomorrow-was able to obtain a podiatrist appointment with Dr. Sharlotte Alamo for Friday morning 09/08/2014 at 8:45 in the morning.  Reviewed plan with Coumadin clinic here at the [place]; and was instructed for patient to hold her Coumadin until Friday evening 09/08/2014 so patient could have podiatry procedure.  Dr. Sharlotte Alamo Podiatrist # [phone]. Address: [place], [place].    * Most recent labs in office note will be faxed to Dr. Sammuel Bailiff office prior to patient's visit.

## 2014-09-07 NOTE — Progress Notes (Signed)
will   SYMPTOM MANAGEMENT CLINIC   HPI: [name] 79 y.o. female diagnosed with multiple myeloma.  Patient is currently undergoing cyclophosphamide/carfilzomib/dexamethasone/Zometa therapy.  Patient presented to the [facility] today to receive cycle 1, day 2 of her chemotherapy regimen.  She is complaining of some mild left second toe pain.  She initially thought that this could very well be a ingrown toenail; but it is in fact a mild paronychia on exam.  Patient is complaining of tenderness to the site.  She denies any recent fevers or chills.  Patient has been holding her chronic Coumadin since this past Saturday, 09/02/2014 for planned Port-A-Cath placement tomorrow 09/07/2014.   HPI  CURRENT THERAPY: Upcoming Treatment Dates - MYELOMA SALVAGE Cyclophosphamide / Carfilzomib / Dexamethasone (CCd) q28d Days with orders from any treatment category:  09/12/2014      SCHEDULING COMMUNICATION      ondansetron (ZOFRAN) IVPB 8 mg      dexamethasone (DECADRON) injection 10 mg      cyclophosphamide (CYTOXAN) 440 mg in sodium chloride 0.9 % 250 mL chemo infusion      carfilzomib (KYPROLIS) 54 mg in dextrose 5 % 50 mL chemo infusion      sodium chloride 0.9 % injection 10 mL      heparin lock flush 100 unit/mL      heparin lock flush 100 unit/mL      alteplase (CATHFLO ACTIVASE) injection 2 mg      sodium chloride 0.9 % injection 3 mL      diphenhydrAMINE (BENADRYL) injection 25 mg      famotidine (PEPCID) IVPB 20 mg      0.9 %  sodium chloride infusion      methylPREDNISolone sodium succinate (SOLU-MEDROL) 125 mg/2 mL injection 125 mg      EPINEPHrine (ADRENALIN) 0.1 MG/ML injection 0.25 mg      EPINEPHrine (ADRENALIN) 0.1 MG/ML injection 0.25 mg      EPINEPHrine (ADRENALIN) injection 0.5 mg      EPINEPHrine (ADRENALIN) injection 0.5 mg      diphenhydrAMINE (BENADRYL) injection 50 mg      albuterol (PROVENTIL) (2.5 MG/3ML) 0.083% nebulizer solution 2.5 mg      0.9 %  sodium  chloride infusion      0.9 %  sodium chloride infusion      TREATMENT CONDITIONS      STEROID NURSING COMMUNICATION 09/13/2014      SCHEDULING COMMUNICATION      ondansetron (ZOFRAN) IVPB 8 mg      dexamethasone (DECADRON) injection 10 mg      carfilzomib (KYPROLIS) 54 mg in dextrose 5 % 50 mL chemo infusion      sodium chloride 0.9 % injection 10 mL      heparin lock flush 100 unit/mL      heparin lock flush 100 unit/mL      alteplase (CATHFLO ACTIVASE) injection 2 mg      sodium chloride 0.9 % injection 3 mL      diphenhydrAMINE (BENADRYL) injection 25 mg      famotidine (PEPCID) IVPB 20 mg      0.9 %  sodium chloride infusion      methylPREDNISolone sodium succinate (SOLU-MEDROL) 125 mg/2 mL injection 125 mg      EPINEPHrine (ADRENALIN) 0.1 MG/ML injection 0.25 mg      EPINEPHrine (ADRENALIN) 0.1 MG/ML injection 0.25 mg      EPINEPHrine (ADRENALIN) injection 0.5 mg      EPINEPHrine (ADRENALIN) injection 0.5 mg  diphenhydrAMINE (BENADRYL) injection 50 mg      albuterol (PROVENTIL) (2.5 MG/3ML) 0.083% nebulizer solution 2.5 mg      0.9 %  sodium chloride infusion      0.9 %  sodium chloride infusion 09/19/2014      SCHEDULING COMMUNICATION      ondansetron (ZOFRAN) IVPB 8 mg      dexamethasone (DECADRON) injection 10 mg      cyclophosphamide (CYTOXAN) 440 mg in sodium chloride 0.9 % 250 mL chemo infusion      carfilzomib (KYPROLIS) 54 mg in dextrose 5 % 50 mL chemo infusion      sodium chloride 0.9 % injection 10 mL      heparin lock flush 100 unit/mL      heparin lock flush 100 unit/mL      alteplase (CATHFLO ACTIVASE) injection 2 mg      sodium chloride 0.9 % injection 3 mL      diphenhydrAMINE (BENADRYL) injection 25 mg      famotidine (PEPCID) IVPB 20 mg      0.9 %  sodium chloride infusion      methylPREDNISolone sodium succinate (SOLU-MEDROL) 125 mg/2 mL injection 125 mg      EPINEPHrine (ADRENALIN) 0.1 MG/ML injection 0.25 mg      EPINEPHrine (ADRENALIN) 0.1 MG/ML  injection 0.25 mg      EPINEPHrine (ADRENALIN) injection 0.5 mg      EPINEPHrine (ADRENALIN) injection 0.5 mg      diphenhydrAMINE (BENADRYL) injection 50 mg      albuterol (PROVENTIL) (2.5 MG/3ML) 0.083% nebulizer solution 2.5 mg      0.9 %  sodium chloride infusion      0.9 %  sodium chloride infusion      TREATMENT CONDITIONS      STEROID NURSING COMMUNICATION    ROS  Past Medical History  Diagnosis Date  . Blood transfusion   . Depression     Mild  . History of chicken pox   . Hyperlipidemia   . Hypertension   . Degenerative joint disease   . Phlebitis   . Multiple myeloma     per Dr. Julien Nordmann, s/p palliative radiation for leg pain 2011 per Dr. Sondra Come  . Deep vein thrombosis of bilateral lower extremities   . Family history of anesthesia complication     DAUGHTER CPR AFTER  . Cardiomyopathy   . Diabetes mellitus     Steroid related  . UTI (urinary tract infection) 09/08/2013  . History of radiation therapy 08/30/13-09/20/13    20 gray to lower lumbar/upper sacrum    Past Surgical History  Procedure Laterality Date  . Abdominal hysterectomy    . Ankle fracture surgery Left     has Multiple myeloma; Diabetes mellitus without complication; HYPERLIPIDEMIA; DEPRESSION, MILD; HYPERTENSION; DEGENERATIVE JOINT DISEASE; LEG PAIN, RIGHT; Gait instability; Left hip pain; Colon cancer screening; Deep vein thrombosis of left lower extremity; Deep vein thrombosis of right lower extremity; DNR no code (do not resuscitate); Chronic steroid use; Delirium; CAP (community acquired pneumonia); Cellulitis, toe; and Paronychia on her problem list.     has No Known Allergies.    Medication List       This list is accurate as of: 09/06/14 11:59 PM.  Always use your most recent med list.               acyclovir 400 MG tablet  Commonly known as:  ZOVIRAX  TAKE ONE TABLET BY MOUTH TWICE DAILY  ALPRAZolam 0.5 MG tablet  Commonly known as:  XANAX  Take 1 tablet (0.5 mg total) by  mouth 2 (two) times daily as needed for anxiety.     Calcium Carbonate-Vitamin D 600-400 MG-UNIT per tablet  Take 1 tablet by mouth 3 (three) times daily with meals.     cephALEXin 500 MG capsule  Commonly known as:  KEFLEX  Take 1 capsule (500 mg total) by mouth 4 (four) times daily.     dexamethasone 4 MG tablet  Commonly known as:  DECADRON  Take 10 tab (40 mg) weekly except on days when you receive elotuzumab. On those days take 7 tab (28 mg) between 3 & 24 hours prior to the start of elotuzumab.     dextromethorphan-guaiFENesin 30-600 MG per 12 hr tablet  Commonly known as:  MUCINEX DM  Take 1 tablet by mouth 2 (two) times daily.     diphenhydrAMINE 25 MG tablet  Commonly known as:  SOMINEX  Take 25 mg by mouth at bedtime as needed for sleep.     glipiZIDE 2.5 MG 24 hr tablet  Commonly known as:  GLUCOTROL XL  Take 2.5 mg by mouth daily with breakfast.     glucose blood test strip  Commonly known as:  ONE TOUCH ULTRA TEST  Use as instructed     lenalidomide 25 MG capsule  Commonly known as:  REVLIMID  Take 1 capsule (25 mg total) by mouth daily. For 21 days, then rest for 7 days. repeat every 28 days.     lidocaine-prilocaine cream  Commonly known as:  EMLA  Apply 1 application topically as needed. Apply to the port 1 hr before chemo, cover with band-aid or press-in-seal to keep from rubbing off     lisinopril-hydrochlorothiazide 20-12.5 MG per tablet  Commonly known as:  PRINZIDE,ZESTORETIC  TAKE ONE-HALF TABLET BY MOUTH ONCE DAILY     loperamide 2 MG tablet  Commonly known as:  IMODIUM A-D  Take 1 tablet (2 mg total) by mouth 4 (four) times daily as needed for diarrhea or loose stools.     magic mouthwash w/lidocaine Soln  Take 5 mLs by mouth 3 (three) times daily as needed for mouth pain (do not swallow, swish and spit 29ml TID PRN).     onetouch ultrasoft lancets  Test blood sugar once daily or as directed.     OxyCODONE 10 mg T12a 12 hr tablet  Commonly  known as:  OXYCONTIN  Take 1 tablet (10 mg total) by mouth every 12 (twelve) hours.     oxycodone 5 MG capsule  Commonly known as:  OXY-IR  Take 1 capsule (5 mg total) by mouth every 6 (six) hours as needed for pain (1 1/2 tablets every 6 hours prn pain).     potassium chloride SA 20 MEQ tablet  Commonly known as:  K-DUR,KLOR-CON  Take 1 tablet (20 mEq total) by mouth as directed. 42meq x 7 days     prochlorperazine 10 MG tablet  Commonly known as:  COMPAZINE  TAKE ONE TABLET BY MOUTH EVERY 6 HOURS AS NEEDED     temazepam 30 MG capsule  Commonly known as:  RESTORIL  Take 1 capsule (30 mg total) by mouth at bedtime as needed for sleep.     warfarin 5 MG tablet  Commonly known as:  COUMADIN  Take 2.5-5 mg by mouth daily. Takes 1(5mg ) tablet on mondays, Wednesday. Takes 1/2(2.5mg ) tablet on Tuesday,thursday, Friday, Saturday and sunday  PHYSICAL EXAMINATION  Vitals: BP 135/60, HR 52, temp 98.1  Physical Exam  Constitutional: She is oriented to person, place, and time. She appears unhealthy.  Eyes: Conjunctivae and EOM are normal. Pupils are equal, round, and reactive to light. Right eye exhibits no discharge. Left eye exhibits no discharge. No scleral icterus.  Neck: Normal range of motion.  Pulmonary/Chest: Effort normal. No respiratory distress.  Musculoskeletal: Normal range of motion. She exhibits edema and tenderness.  Patient's left second toe with mild to moderate.  Again noted.  Mild edema and tenderness on exam.  Neurological: She is alert and oriented to person, place, and time.  Skin: Skin is warm and dry. No rash noted. There is erythema.  See previous note regarding left second toe paronychia.  Psychiatric: Affect normal.  Nursing note and vitals reviewed.   LABORATORY DATA:. Appointment on 09/05/2014  Component Date Value Ref Range Status  . WBC 09/05/2014 3.8* 3.9 - 10.3 10e3/uL Final  . NEUT# 09/05/2014 2.8  1.5 - 6.5 10e3/uL Final  . HGB  09/05/2014 10.7* 11.6 - 15.9 g/dL Final  . HCT 09/05/2014 33.8* 34.8 - 46.6 % Final  . Platelets 09/05/2014 234  145 - 400 10e3/uL Final  . MCV 09/05/2014 97.4  79.5 - 101.0 fL Final  . MCH 09/05/2014 30.8  25.1 - 34.0 pg Final  . MCHC 09/05/2014 31.6  31.5 - 36.0 g/dL Final  . RBC 09/05/2014 3.47* 3.70 - 5.45 10e6/uL Final  . RDW 09/05/2014 18.3* 11.2 - 14.5 % Final  . lymph# 09/05/2014 0.5* 0.9 - 3.3 10e3/uL Final  . MONO# 09/05/2014 0.3  0.1 - 0.9 10e3/uL Final  . Eosinophils Absolute 09/05/2014 0.1  0.0 - 0.5 10e3/uL Final  . Basophils Absolute 09/05/2014 0.0  0.0 - 0.1 10e3/uL Final  . NEUT% 09/05/2014 74.4  38.4 - 76.8 % Final  . LYMPH% 09/05/2014 14.4  14.0 - 49.7 % Final  . MONO% 09/05/2014 8.6  0.0 - 14.0 % Final  . EOS% 09/05/2014 1.8  0.0 - 7.0 % Final  . BASO% 09/05/2014 0.8  0.0 - 2.0 % Final  . Sodium 09/05/2014 144  136 - 145 mEq/L Final  . Potassium 09/05/2014 3.4* 3.5 - 5.1 mEq/L Final  . Chloride 09/05/2014 102  98 - 109 mEq/L Final  . CO2 09/05/2014 29  22 - 29 mEq/L Final  . Glucose 09/05/2014 84  70 - 140 mg/dl Final  . BUN 09/05/2014 12.7  7.0 - 26.0 mg/dL Final  . Creatinine 09/05/2014 0.9  0.6 - 1.1 mg/dL Final  . Total Bilirubin 09/05/2014 0.33  0.20 - 1.20 mg/dL Final  . Alkaline Phosphatase 09/05/2014 64  40 - 150 U/L Final  . AST 09/05/2014 21  5 - 34 U/L Final  . ALT 09/05/2014 21  0 - 55 U/L Final  . Total Protein 09/05/2014 6.2* 6.4 - 8.3 g/dL Final  . Albumin 09/05/2014 3.4* 3.5 - 5.0 g/dL Final  . Calcium 09/05/2014 9.5  8.4 - 10.4 mg/dL Final  . Anion Gap 09/05/2014 13* 3 - 11 mEq/L Final  . EGFR 09/05/2014 77* >90 ml/min/1.73 m2 Final   eGFR is calculated using the CKD-EPI Creatinine Equation (2009)     RADIOGRAPHIC STUDIES: No results found.  ASSESSMENT/PLAN:    Multiple myeloma Patient received cycle 1, day 2 of her cyclophosphamide/carfilzomib/dexamethasone chemotherapy regimen earlier today.  She is scheduled to return for cycle 1, day  8 of the same regimen on 09/12/2014.  Patient also continues to receive Zometa  for her previously diagnosed bone metastasis.  Patient last received Zometa on 09/05/2014.  Paronychia Patient initially thought that she had an ingrown toenail to her left second toe.  On closer exam-it does appear that patient has a mild to moderate paronychia instead.  Area is tender with palpation on exam.  Since patient has been holding Coumadin since this past Saturday, 09/02/2014 in preparation for Port-A-Cath placement tomorrow-was able to obtain a podiatrist appointment with Dr. Sharlotte Alamo for Friday morning 09/08/2014 at 8:45 in the morning.  Reviewed plan with Coumadin clinic here at the [facility]; and was instructed for patient to hold her Coumadin until Friday evening 09/08/2014 so patient could have podiatry procedure.  Dr. Sharlotte Alamo Podiatrist # [phone]. Address: [place], [place].    * Most recent labs in office note will be faxed to Dr. Sammuel Bailiff office prior to patient's visit.    Patient stated understanding of all instructions; and was in agreement with this plan of care. The patient knows to call the clinic with any problems, questions or concerns.   Review/collaboration with Dr. Julien Nordmann regarding all aspects of patient's visit today.   Total time spent with patient was   25 minutes;  with greater th75 percent of that time spent in face to face counseling reher symptoms, and coordination of care and follow up.  Disclaimer: This note was dictated with voice recognition software. Similar sounding words can inadvertently be transcribed and may not be corrected upon review.   Drue Second, NP 09/07/2014

## 2014-09-07 NOTE — Telephone Encounter (Signed)
Pt's daughter called and says pt is at [facility] ED and needs a follow up apptmt for Friday, 09/08/2014 b/c they could not get her port in due to her heart rate dropping low and b/c elevating, EKG showed no abnormalities. Since your schedule is completely full I added her to Dr. Alla German schedule at 11:30. Thank you.

## 2014-09-07 NOTE — ED Provider Notes (Signed)
CSN: [id number]     Arrival date & time 09/07/14  1516 History   First MD Initiated Contact with Patient 09/07/14 1525     Chief Complaint  Patient presents with  . Bradycardia  . Hypertension     (Consider location/radiation/quality/duration/timing/severity/associated sxs/prior Treatment) HPI  79 year old female presents from radiology after she was noted to have a heart rate in the 30s. Radiology states the lowest heart rate was 36. She typically has heart rates in the 60s, most recent had a heart rate of 58. However has been in the 33s and 40s prior to getting a port today. Due to the bradycardia she did not receive her procedure. The patient was also hypertensive in the 200s. She was given labetalol IV by radiology and the blood pressure has come down. Patient endorses she has taken her morning blood pressure medicines. She denies chest pain, dyspnea, any pain, dizziness/lightheadedness.  Past Medical History  Diagnosis Date  . Blood transfusion   . Depression     Mild  . History of chicken pox   . Hyperlipidemia   . Hypertension   . Degenerative joint disease   . Phlebitis   . Multiple myeloma     per Dr. Julien Nordmann, s/p palliative radiation for leg pain 2011 per Dr. Sondra Come  . Deep vein thrombosis of bilateral lower extremities   . Family history of anesthesia complication     DAUGHTER CPR AFTER  . Cardiomyopathy   . Diabetes mellitus     Steroid related  . UTI (urinary tract infection) 09/08/2013  . History of radiation therapy 08/30/13-09/20/13    20 gray to lower lumbar/upper sacrum   Past Surgical History  Procedure Laterality Date  . Abdominal hysterectomy    . Ankle fracture surgery Left    Family History  Problem Relation Age of Onset  . Diabetes Mother   . Hypertension Mother   . Arthritis Mother   . Stroke Mother   . Diabetes Father   . Hypertension Father   . Arthritis Father   . Stroke Father   . Cancer Sister     stomach  . Stroke Brother   . Cancer  Brother     prostate <50  . Stroke Daughter   . Stroke Son   . Arthritis Other     Parents  . Cancer Other     Colon, 1st degree relative <60  . Diabetes Other     Parents  . Stroke Other     1st degree relative <60   History  Substance Use Topics  . Smoking status: Former Smoker -- 1.00 packs/day for 13 years    Quit date: 09/01/1973  . Smokeless tobacco: Never Used     Comment: QIUT MANY YEARS AGO  . Alcohol Use: No   OB History    No data available     Review of Systems  Constitutional: Negative for fever.  Respiratory: Negative for shortness of breath.   Cardiovascular: Negative for chest pain.  Gastrointestinal: Negative for vomiting and abdominal pain.  Neurological: Negative for dizziness and light-headedness.  All other systems reviewed and are negative.     Allergies  Review of patient's allergies indicates no known allergies.  Home Medications   Prior to Admission medications   Medication Sig Start Date End Date Taking? Authorizing Provider  acyclovir (ZOVIRAX) 400 MG tablet TAKE ONE TABLET BY MOUTH TWICE DAILY 09/06/14   Curt Bears, MD  ALPRAZolam Duanne Moron) 0.5 MG tablet Take 1 tablet (0.5 mg  total) by mouth 2 (two) times daily as needed for anxiety. 08/03/14   Curt Bears, MD  Alum & Mag Hydroxide-Simeth (MAGIC MOUTHWASH W/LIDOCAINE) SOLN Take 5 mLs by mouth 3 (three) times daily as needed for mouth pain (do not swallow, swish and spit 4ml TID PRN). 06/29/14   Curt Bears, MD  Calcium Carbonate-Vitamin D 600-400 MG-UNIT per tablet Take 1 tablet by mouth 3 (three) times daily with meals.      Historical Provider, MD  cephALEXin (KEFLEX) 500 MG capsule Take 1 capsule (500 mg total) by mouth 4 (four) times daily. 09/06/14   Drue Second, NP  dexamethasone (DECADRON) 4 MG tablet Take 10 tab (40 mg) weekly except on days when you receive elotuzumab. On those days take 7 tab (28 mg) between 3 & 24 hours prior to the start of elotuzumab. 07/06/14   Tonia Ghent, MD  diphenhydrAMINE (SOMINEX) 25 MG tablet Take 25 mg by mouth at bedtime as needed for sleep.    Historical Provider, MD  glipiZIDE (GLUCOTROL XL) 2.5 MG 24 hr tablet Take 2.5 mg by mouth daily with breakfast.    Historical Provider, MD  ibuprofen (ADVIL) 200 MG tablet Take 400 mg by mouth every 6 (six) hours as needed for headache or moderate pain.    Historical Provider, MD  lenalidomide (REVLIMID) 25 MG capsule Take 1 capsule (25 mg total) by mouth daily. For 21 days, then rest for 7 days. repeat every 28 days. Patient not taking: Reported on 09/07/2014 08/16/14   Curt Bears, MD  lidocaine-prilocaine (EMLA) cream Apply 1 application topically as needed. Apply to the port 1 hr before chemo, cover with band-aid or press-in-seal to keep from rubbing off 08/31/14   Curt Bears, MD  lisinopril-hydrochlorothiazide (PRINZIDE,ZESTORETIC) 20-12.5 MG per tablet TAKE ONE-HALF TABLET BY MOUTH ONCE DAILY 05/30/14   Tonia Ghent, MD  loperamide (IMODIUM A-D) 2 MG tablet Take 1 tablet (2 mg total) by mouth 4 (four) times daily as needed for diarrhea or loose stools. 09/12/13   Sheila Oats, MD  oxycodone (OXY-IR) 5 MG capsule Take 1 capsule (5 mg total) by mouth every 6 (six) hours as needed for pain (1 1/2 tablets every 6 hours prn pain). 09/06/14   Curt Bears, MD  OxyCODONE (OXYCONTIN) 10 mg T12A 12 hr tablet Take 1 tablet (10 mg total) by mouth every 12 (twelve) hours. 02/06/14   Curt Bears, MD  potassium chloride SA (K-DUR,KLOR-CON) 20 MEQ tablet Take 1 tablet (20 mEq total) by mouth as directed. 14meq x 7 days Patient taking differently: Take 20 mEq by mouth daily. 69meq x 7 days 08/31/14   Curt Bears, MD  prochlorperazine (COMPAZINE) 10 MG tablet TAKE ONE TABLET BY MOUTH EVERY 6 HOURS AS NEEDED Patient taking differently: TAKE ONE TABLET BY MOUTH EVERY 6 HOURS AS NEEDED FOR NAUSEA. 05/09/14   Curt Bears, MD  temazepam (RESTORIL) 30 MG capsule Take 1 capsule (30 mg total)  by mouth at bedtime as needed for sleep. 03/12/14   Robbie Lis, MD  warfarin (COUMADIN) 5 MG tablet Take 2.5-5 mg by mouth daily. Takes 1(5mg ) tablet on mondays, Wednesday. Takes 1/2(2.5mg ) tablet on Tuesday,thursday, Friday, Saturday and sunday    Historical Provider, MD   BP 175/57 mmHg  Pulse 48  Temp(Src) 97.9 F (36.6 C) (Oral)  Resp 17  SpO2 98% Physical Exam  Constitutional: She is oriented to person, place, and time. She appears well-developed and well-nourished. No distress.  HENT:  Head: Normocephalic  and atraumatic.  Right Ear: External ear normal.  Left Ear: External ear normal.  Nose: Nose normal.  Eyes: Right eye exhibits no discharge. Left eye exhibits no discharge.  Neck: Neck supple.  Cardiovascular: Regular rhythm, normal heart sounds and intact distal pulses.  Bradycardia present.   Pulmonary/Chest: Effort normal and breath sounds normal. She has no wheezes. She has no rales.  Abdominal: Soft. She exhibits no distension. There is no tenderness.  Neurological: She is alert and oriented to person, place, and time.  Skin: Skin is warm and dry. She is not diaphoretic.  Nursing note and vitals reviewed.   ED Course  Procedures (including critical care time) Labs Review Labs Reviewed  CBC WITH DIFFERENTIAL - Abnormal; Notable for the following:    RBC 3.03 (*)    Hemoglobin 9.2 (*)    HCT 29.8 (*)    RDW 17.0 (*)    Neutrophils Relative % 96 (*)    Lymphocytes Relative 2 (*)    Lymphs Abs 0.1 (*)    Monocytes Relative 2 (*)    All other components within normal limits  PROTIME-INR - Abnormal; Notable for the following:    Prothrombin Time 15.8 (*)    All other components within normal limits  BASIC METABOLIC PANEL - Abnormal; Notable for the following:    Glucose, Bld 208 (*)    BUN 28 (*)    Creatinine, Ser 1.12 (*)    GFR calc non Af Amer 46 (*)    GFR calc Af Amer 54 (*)    All other components within normal limits  MAGNESIUM  TROPONIN I     Imaging Review Dg Chest Port 1 View  09/07/2014   CLINICAL DATA:  Bradycardia.  Diabetes.  EXAM: PORTABLE CHEST - 1 VIEW  COMPARISON:  Single view of the chest 03/09/2014 and PA and lateral chest 09/07/2013.  FINDINGS: Lung volumes are low with some crowding of the bronchovascular structures. Heart size is upper normal. There is vascular congestion without frank edema. No consolidative process, pneumothorax or effusion is identified. Remote distal left clavicle fracture is noted.  IMPRESSION: No acute disease.   Electronically Signed   By: Inge Rise M.D.   On: 09/07/2014 16:09   Ir Radiologist Eval & Mgmt  09/07/2014   INDICATION: 79 year old female with multiple myeloma has been referred for port catheter placement by Dr. Jethro Poling: LIMITED SONOGRAPHIC Bailey.  EVALUATION AND MANAGEMENT  MEDICATIONS: NONE  ANESTHESIA/SEDATION: None  CONTRAST:  None  COMPLICATIONS: None  PROCEDURE: Informed consent was obtained from the patient and the patient's family following an explanation of the procedure, risks, benefits and alternatives. The patient understands, agrees and consents for the procedure. All questions were addressed. A time out was performed prior to the initiation of the procedure.  The patient was brought to the interventional radiology suite and placed in the supine position on the fluoroscopy table. Ultrasound survey of the right neck was performed for patency of the right internal jugular vein. Image was stored and sent to PACs.  Before initiating the procedure, and before initiation of sedation, the patient is recognize to be hypertensive.  In the room, the patient's blood pressure was 210/110. After treatment with 20 mg IV labetalol, the systolic blood pressure improved to 180.  [name] heart rate was bradycardic. The lowest heart rate recorded in the Interventional suite was 36.  With investigation into the patient's electronic records, it appears  that the  bradycardia is a new phenomenon with this patient. No prior diagnosis of bradycardia is present. One year prior to this date, heart rate was recorded in the 9 these. In December during office visits the patient's heart rate was 58.  The patient denies any current chest pain, dizziness, double vision, or headache. It appears she is asymptomatic from her bradycardia and her hypertension.  A direct conversation with Dr. Julien Nordmann relayed this information, and both myself and Dr. Julien Nordmann agree that the patient should not undergo the port catheter placement at this time. The patient will be transported to the emergency department for further evaluation for her asymptomatic bradycardia and hypertension.  IMPRESSION: 79 year old female presenting for port catheter placement for multiple myeloma, discovered to have new asymptomatic bradycardia, as well as hypertension as high as 210/110.  The port catheter placement will be deferred to a later date. The patient will be transported to the [facility] emergency department in stable condition for further evaluation.  The plan of care was discussed with her primary oncologist Dr. Julien Nordmann, as well as the patient's family, who are in agreement.  Signed,  Dulcy Fanny. Earleen Newport, DO  [facility]  [facility]   Electronically Signed   By: Corrie Mckusick D.O.   On: 09/07/2014 17:31     Date: 09/07/2014  Rate: 45  Rhythm: sinus bradycardia  QRS Axis: normal  Intervals: normal  ST/T Wave abnormalities: normal  Conduction Disutrbances:none  Narrative Interpretation: Sinus bradycardia is new but otherwise EKG unchanged  Old EKG Reviewed: changes noted    MDM   Final diagnoses:  Sinus bradycardia    Patient has acute on chronic bradycardia. Typically her HR is low 60s, high 50s, now is consistently in 40s. Just started keflex yesterday for a toe infection, but I doubt this is causing new bradycardia. Not on any BB's or  CCB's. Asymptomatic here, electrolytes benign. Discussed with cards, Dr. Sallyanne Kuster, who recommends discharge if asymptomatic since she has sinus brady with normal width QRS. She agrees to this plan, and will f/u with PCP tomorrow.     Ephraim Hamburger, MD 09/08/14 [phone]

## 2014-09-07 NOTE — H&P (Signed)
Chief Complaint: "I'm getting a port a cath"  Referring Physician(s): Mohamed,Mohamed  History of Present Illness: [name] is a 79 y.o. female with history of multiple myeloma/prior chemoradiation who presents today for port a cath placement for additional chemotherapy due to disease progression.  Past Medical History  Diagnosis Date  . Blood transfusion   . Depression     Mild  . History of chicken pox   . Hyperlipidemia   . Hypertension   . Degenerative joint disease   . Phlebitis   . Multiple myeloma     per Dr. Julien Nordmann, s/p palliative radiation for leg pain 2011 per Dr. Sondra Come  . Deep vein thrombosis of bilateral lower extremities   . Family history of anesthesia complication     DAUGHTER CPR AFTER  . Cardiomyopathy   . Diabetes mellitus     Steroid related  . UTI (urinary tract infection) 09/08/2013  . History of radiation therapy 08/30/13-09/20/13    20 gray to lower lumbar/upper sacrum    Past Surgical History  Procedure Laterality Date  . Abdominal hysterectomy    . Ankle fracture surgery Left     Allergies: Review of patient's allergies indicates no known allergies.  Medications: Prior to Admission medications   Medication Sig Start Date End Date Taking? Authorizing Provider  acyclovir (ZOVIRAX) 400 MG tablet TAKE ONE TABLET BY MOUTH TWICE DAILY 09/06/14   Curt Bears, MD  ALPRAZolam Duanne Moron) 0.5 MG tablet Take 1 tablet (0.5 mg total) by mouth 2 (two) times daily as needed for anxiety. 08/03/14   Curt Bears, MD  Alum & Mag Hydroxide-Simeth (MAGIC MOUTHWASH W/LIDOCAINE) SOLN Take 5 mLs by mouth 3 (three) times daily as needed for mouth pain (do not swallow, swish and spit 71m TID PRN). 06/29/14   MCurt Bears MD  Calcium Carbonate-Vitamin D 600-400 MG-UNIT per tablet Take 1 tablet by mouth 3 (three) times daily with meals.      Historical Provider, MD  cephALEXin (KEFLEX) 500 MG capsule Take 1 capsule (500 mg total) by mouth 4 (four) times  daily. 09/06/14   CDrue Second NP  dexamethasone (DECADRON) 4 MG tablet Take 10 tab (40 mg) weekly except on days when you receive elotuzumab. On those days take 7 tab (28 mg) between 3 & 24 hours prior to the start of elotuzumab. 07/06/14   GTonia Ghent MD  dextromethorphan-guaiFENesin ([facility]) 30-600 MG per 12 hr tablet Take 1 tablet by mouth 2 (two) times daily.    Historical Provider, MD  diphenhydrAMINE (SOMINEX) 25 MG tablet Take 25 mg by mouth at bedtime as needed for sleep.    Historical Provider, MD  glipiZIDE (GLUCOTROL XL) 2.5 MG 24 hr tablet Take 2.5 mg by mouth daily with breakfast.    Historical Provider, MD  lenalidomide (REVLIMID) 25 MG capsule Take 1 capsule (25 mg total) by mouth daily. For 21 days, then rest for 7 days. repeat every 28 days. 08/16/14   MCurt Bears MD  lidocaine-prilocaine (EMLA) cream Apply 1 application topically as needed. Apply to the port 1 hr before chemo, cover with band-aid or press-in-seal to keep from rubbing off 08/31/14   MCurt Bears MD  lisinopril-hydrochlorothiazide (PRINZIDE,ZESTORETIC) 20-12.5 MG per tablet TAKE ONE-HALF TABLET BY MOUTH ONCE DAILY 05/30/14   GTonia Ghent MD  loperamide (IMODIUM A-D) 2 MG tablet Take 1 tablet (2 mg total) by mouth 4 (four) times daily as needed for diarrhea or loose stools. 09/12/13   ASheila Oats MD  oxycodone (OXY-IR) 5 MG capsule Take 1 capsule (5 mg total) by mouth every 6 (six) hours as needed for pain (1 1/2 tablets every 6 hours prn pain). 09/06/14   Curt Bears, MD  OxyCODONE (OXYCONTIN) 10 mg T12A 12 hr tablet Take 1 tablet (10 mg total) by mouth every 12 (twelve) hours. 02/06/14   Curt Bears, MD  potassium chloride SA (K-DUR,KLOR-CON) 20 MEQ tablet Take 1 tablet (20 mEq total) by mouth as directed. 82mq x 7 days 08/31/14   MCurt Bears MD  prochlorperazine (COMPAZINE) 10 MG tablet TAKE ONE TABLET BY MOUTH EVERY 6 HOURS AS NEEDED 05/09/14   MCurt Bears MD  temazepam  (RESTORIL) 30 MG capsule Take 1 capsule (30 mg total) by mouth at bedtime as needed for sleep. 03/12/14   ARobbie Lis MD  warfarin (COUMADIN) 5 MG tablet Take 2.5-5 mg by mouth daily. Takes 1(574m tablet on mondays, Wednesday. Takes 1/2(2.42m67mtablet on Tuesday,thursday, Friday, Saturday and sunday    Historical Provider, MD    Family History  Problem Relation Age of Onset  . Diabetes Mother   . Hypertension Mother   . Arthritis Mother   . Stroke Mother   . Diabetes Father   . Hypertension Father   . Arthritis Father   . Stroke Father   . Cancer Sister     stomach  . Stroke Brother   . Cancer Brother     prostate <50  . Stroke Daughter   . Stroke Son   . Arthritis Other     Parents  . Cancer Other     Colon, 1st degree relative <60  . Diabetes Other     Parents  . Stroke Other     1st degree relative <60    History   Social History  . Marital Status: Widowed    Spouse Name: N/A    Number of Children: N/A  . Years of Education: N/A   Occupational History  . Retired    Social History Main Topics  . Smoking status: Former Smoker -- 1.00 packs/day for 13 years    Quit date: 09/01/1973  . Smokeless tobacco: Never Used     Comment: QIUT MANY YEARS AGO  . Alcohol Use: No  . Drug Use: No  . Sexual Activity: No   Other Topics Concern  . None   Social History Narrative   Regular exercise:  Yes     Review of Systems  Constitutional: Negative for fever and chills.  Respiratory: Negative for cough and shortness of breath.   Cardiovascular: Negative for chest pain.  Gastrointestinal: Negative for nausea, vomiting and abdominal pain.  Genitourinary: Negative for dysuria and hematuria.  Musculoskeletal: Positive for back pain.  Neurological: Negative for headaches.  Psychiatric/Behavioral: The patient is nervous/anxious.     Vital Signs: BP 170/73 mmHg  Pulse 50  Temp(Src) 98.2 F (36.8 C) (Oral)  Resp 16  Ht _0  (1.473 m)  Wt 117 lb (53.071 kg)  BMI  24.46 kg/m2  SpO2 100%  Physical Exam  Constitutional: She is oriented to person, place, and time.  Thin BF in NAD  Cardiovascular: Regular rhythm.   bradycardic  Pulmonary/Chest: Effort normal and breath sounds normal.  Abdominal: Soft. Bowel sounds are normal. There is no tenderness.  Musculoskeletal: Normal range of motion. She exhibits no edema.  Neurological: She is alert and oriented to person, place, and time.    Imaging: No results found.  Labs:  CBC:  Recent Labs  08/17/14 1124 08/28/14 1134 08/31/14 1131 09/05/14 1452  WBC 5.1 4.1 3.8* 3.8*  HGB 10.9* 10.7* 10.2* 10.7*  HCT 34.4* 34.0* 32.1* 33.8*  PLT 206 253 259 234    COAGS:  Recent Labs  08/10/14 08/10/14 1110 08/28/14 08/28/14 1134  INR 2.4 2.40 3.0 3.00    BMP:  Recent Labs  09/09/13 0608  03/09/14 2349 03/11/14 1315 03/12/14 0415  05/18/14 1308  08/17/14 1124 08/28/14 1134 08/31/14 1130 09/05/14 1453  NA 142  < > 138 141 146  < > 141  < > 139 142 141 144  K 4.1  < > 4.1 3.8 3.6*  < > 3.3*  < > 4.0 3.2* 3.6 3.4*  CL 110  --  98 102 107  --  97  --   --   --   --   --   CO2 20  < > _0 < > 30  < > _1 GLUCOSE 153*  < > 110* 87 97  < > 183*  < > 189* 140 208* 84  BUN 15  < > 21 25* 27*  < > 14  < > 17.7 14.7 19.7 12.7  CALCIUM 8.3*  < > 10.3 10.1 9.9  < > 10.7*  < > 9.9 9.6 9.3 9.5  CREATININE 0.97  < > 1.53* 0.97 1.00  < > 0.99  < > 1.2* 1.2* 1.0 0.9  GFRNONAA 56*  --  32* 56* 54*  --   --   --   --   --   --   --   GFRAA 65*  --  37* 65* 62*  --   --   --   --   --   --   --   < > = values in this interval not displayed.  LIVER FUNCTION TESTS:  Recent Labs  08/17/14 1124 08/28/14 1134 08/31/14 1130 09/05/14 1453  BILITOT 0.46 0.34 0.34 0.33  AST _2 ALT _3 ALKPHOS 60 60 65 64  PROT 6.3* 6.1* 6.1* 6.2*  ALBUMIN 3.5 3.4* 3.3* 3.4*  09/07/14 labs pending  TUMOR MARKERS: No results for input(s): AFPTM, CEA, CA199, CHROMGRNA in the last 8760  hours.  Assessment and Plan: [name] is a 79 y.o. female with history of multiple myeloma/prior chemoradiation who presents today for port a cath placement for additional chemotherapy due to disease progression. Details/risks of procedure d/w pt/daughters with their understanding and consent.      Signed: Autumn Messing 09/07/2014, 12:42 PM

## 2014-09-07 NOTE — Assessment & Plan Note (Signed)
Patient received cycle 1, day 2 of her cyclophosphamide/carfilzomib/dexamethasone chemotherapy regimen earlier today.  She is scheduled to return for cycle 1, day 8 of the same regimen on 09/12/2014.  Patient also continues to receive Zometa for her previously diagnosed bone metastasis.  Patient last received Zometa on 09/05/2014.

## 2014-09-07 NOTE — Progress Notes (Signed)
Addendum: Patient was prescribed Keflex antibiotics for paronychia treatment.

## 2014-09-07 NOTE — ED Notes (Signed)
Pt presents with c/o hypertension and bradycardia. Pt was being seen to have a port-a-cath placed in radiology and staff noticed her blood pressure was high and she was bradycardic. Pt reports no hx of same. Pt denies any pain at this time.

## 2014-09-07 NOTE — Discharge Instructions (Signed)
Bradycardia Bradycardia is a term for a heart rate (pulse) that, in adults, is slower than 60 beats per minute. A normal rate is 60 to 100 beats per minute. A heart rate below 60 beats per minute may be normal for some adults with healthy hearts. If the rate is too slow, the heart may have trouble pumping the volume of blood the body needs. If the heart rate gets too low, blood flow to the brain may be decreased and may make you feel lightheaded, dizzy, or faint. The heart has a natural pacemaker in the top of the heart called the SA node (sinoatrial or sinus node). This pacemaker sends out regular electrical signals to the muscle of the heart, telling the heart muscle when to beat (contract). The electrical signal travels from the upper parts of the heart (atria) through the AV node (atrioventricular node), to the lower chambers of the heart (ventricles). The ventricles squeeze, pumping the blood from your heart to your lungs and to the rest of your body. CAUSES   Problem with the heart's electrical system.  Problem with the heart's natural pacemaker.  Heart disease, damage, or infection.  Medications.  Problems with minerals and salts (electrolytes). SYMPTOMS   Fainting (syncope).  Fatigue and weakness.  Shortness of breath (dyspnea).  Chest pain (angina).  Drowsiness.  Confusion. DIAGNOSIS   An electrocardiogram (ECG) can help your caregiver determine the type of slow heart rate you have.  If the cause is not seen on an ECG, you may need to wear a heart monitor that records your heart rhythm for several hours or days.  Blood tests. TREATMENT   Electrolyte supplements.  Medications.  Withholding medication which is causing a slow heart rate.  Pacemaker placement. SEEK IMMEDIATE MEDICAL CARE IF:   You feel lightheaded or faint.  You develop an irregular heart rate.  You feel chest pain or have trouble breathing. MAKE SURE YOU:   Understand these  instructions.  Will watch your condition.  Will get help right away if you are not doing well or get worse. Document Released: 05/10/2002 Document Revised: 11/10/2011 Document Reviewed: 11/23/2013 [facility] Patient Information 2015 [place], [place]. This information is not intended to replace advice given to you by your health care provider. Make sure you discuss any questions you have with your health care provider.

## 2014-09-07 NOTE — ED Notes (Signed)
Bed: RESB Expected date:  Expected time:  Means of arrival:  Comments: radiology

## 2014-09-08 ENCOUNTER — Encounter: Payer: Self-pay | Admitting: Internal Medicine

## 2014-09-08 ENCOUNTER — Telehealth: Payer: Self-pay | Admitting: *Deleted

## 2014-09-08 ENCOUNTER — Ambulatory Visit (INDEPENDENT_AMBULATORY_CARE_PROVIDER_SITE_OTHER): Payer: Medicare Other | Admitting: Internal Medicine

## 2014-09-08 ENCOUNTER — Other Ambulatory Visit: Payer: Self-pay | Admitting: *Deleted

## 2014-09-08 VITALS — BP 144/60 | HR 68 | Temp 97.7°F | Wt 124.0 lb

## 2014-09-08 DIAGNOSIS — L600 Ingrowing nail: Secondary | ICD-10-CM | POA: Diagnosis not present

## 2014-09-08 DIAGNOSIS — L03032 Cellulitis of left toe: Secondary | ICD-10-CM | POA: Diagnosis not present

## 2014-09-08 DIAGNOSIS — B351 Tinea unguium: Secondary | ICD-10-CM | POA: Diagnosis not present

## 2014-09-08 DIAGNOSIS — C9000 Multiple myeloma not having achieved remission: Secondary | ICD-10-CM

## 2014-09-08 DIAGNOSIS — R001 Bradycardia, unspecified: Secondary | ICD-10-CM

## 2014-09-08 DIAGNOSIS — E119 Type 2 diabetes mellitus without complications: Secondary | ICD-10-CM | POA: Diagnosis not present

## 2014-09-08 MED ORDER — ALPRAZOLAM 0.5 MG PO TABS: 0.5000 mg | ORAL_TABLET | Freq: Two times a day (BID) | ORAL | Status: DC | PRN

## 2014-09-08 NOTE — Telephone Encounter (Signed)
Thanks. Noted. Routed to [facility] as a FYI.

## 2014-09-08 NOTE — Telephone Encounter (Signed)
Patient saw MD today regarding low heart rate.

## 2014-09-08 NOTE — Assessment & Plan Note (Addendum)
Had elevated blood pressure due to severe anxiety in radiology suite HR had been low---then very low after labetalol EKG benign No problems since then HR normal now  Nothing concerning with this picture No reason not to reschedule port insertion May benefit from benzpodiazepine when procedure is done--she has alprazolam (advised she should take 2 before going to procedure)

## 2014-09-08 NOTE — Progress Notes (Signed)
Pre visit review using our clinic review tool, if applicable. No additional management support is needed unless otherwise documented below in the visit note. 

## 2014-09-08 NOTE — Progress Notes (Signed)
Subjective:    Patient ID: [name], female    DOB: 02-Mar-1936, 79 y.o.   MRN: [id number]  HPI Here with daughter [name] in follow up of bradycardia  She thinks she got nervous about procedure--- thinks that caused her BP to go up "the room got dark" Initial vitals in fluoro room 170/73 and HR 50 Then BP went up to 235 systolic Got dose of labetalol and HR down into 30's  Reviewed ER visit and EKG---sinus bradycardia but otherwise normal No palpitations since then No chest pain No dizziness or syncope Walks with rolling walker or brief times without it in house---no problems with this  Current Outpatient Prescriptions on File Prior to Visit  Medication Sig Dispense Refill  . acyclovir (ZOVIRAX) 400 MG tablet TAKE ONE TABLET BY MOUTH TWICE DAILY 60 tablet 0  . ALPRAZolam (XANAX) 0.5 MG tablet Take 1 tablet (0.5 mg total) by mouth 2 (two) times daily as needed for anxiety. 60 tablet 0  . Alum & Mag Hydroxide-Simeth (MAGIC MOUTHWASH W/LIDOCAINE) SOLN Take 5 mLs by mouth 3 (three) times daily as needed for mouth pain (do not swallow, swish and spit 70m TID PRN). 120 mL 0  . Calcium Carbonate-Vitamin D 600-400 MG-UNIT per tablet Take 1 tablet by mouth 3 (three) times daily with meals.      .Marland Kitchendexamethasone (DECADRON) 4 MG tablet Take 10 tab (40 mg) weekly except on days when you receive elotuzumab. On those days take 7 tab (28 mg) between 3 & 24 hours prior to the start of elotuzumab.    . diphenhydrAMINE (SOMINEX) 25 MG tablet Take 25 mg by mouth at bedtime as needed for sleep.    .Marland KitchenglipiZIDE (GLUCOTROL XL) 2.5 MG 24 hr tablet Take 2.5 mg by mouth daily with breakfast.    . ibuprofen (ADVIL) 200 MG tablet Take 400 mg by mouth every 6 (six) hours as needed for headache or moderate pain.    .Marland Kitchenlenalidomide (REVLIMID) 25 MG capsule Take 1 capsule (25 mg total) by mouth daily. For 21 days, then rest for 7 days. repeat every 28 days. 21 capsule 0  . lisinopril-hydrochlorothiazide  (PRINZIDE,ZESTORETIC) 20-12.5 MG per tablet TAKE ONE-HALF TABLET BY MOUTH ONCE DAILY 45 tablet 1  . loperamide (IMODIUM A-D) 2 MG tablet Take 1 tablet (2 mg total) by mouth 4 (four) times daily as needed for diarrhea or loose stools.    .Marland Kitchenoxycodone (OXY-IR) 5 MG capsule Take 1 capsule (5 mg total) by mouth every 6 (six) hours as needed for pain (1 1/2 tablets every 6 hours prn pain). 90 capsule 0  . OxyCODONE (OXYCONTIN) 10 mg T12A 12 hr tablet Take 1 tablet (10 mg total) by mouth every 12 (twelve) hours. 60 tablet 0  . potassium chloride SA (K-DUR,KLOR-CON) 20 MEQ tablet Take 1 tablet (20 mEq total) by mouth as directed. 270m x 7 days (Patient taking differently: Take 20 mEq by mouth daily. 2054mx 7 days) 7 tablet 0  . prochlorperazine (COMPAZINE) 10 MG tablet TAKE ONE TABLET BY MOUTH EVERY 6 HOURS AS NEEDED (Patient taking differently: TAKE ONE TABLET BY MOUTH EVERY 6 HOURS AS NEEDED FOR NAUSEA.) 30 tablet 0  . temazepam (RESTORIL) 30 MG capsule Take 1 capsule (30 mg total) by mouth at bedtime as needed for sleep. 3 capsule 0  . warfarin (COUMADIN) 5 MG tablet Take 2.5-5 mg by mouth daily. Takes 1(5mg98mablet on mondays, Wednesday. Takes 1/2(2.5mg)42mblet on Tuesday,thursday, Friday, Saturday and sunday  Current Facility-Administered Medications on File Prior to Visit  Medication Dose Route Frequency Provider Last Rate Last Dose  . Zoledronic Acid (ZOMETA) 4 mg IVPB  4 mg Intravenous Once Curt Bears, MD        No Known Allergies  Past Medical History  Diagnosis Date  . Blood transfusion   . Depression     Mild  . History of chicken pox   . Hyperlipidemia   . Hypertension   . Degenerative joint disease   . Phlebitis   . Multiple myeloma     per Dr. Julien Nordmann, s/p palliative radiation for leg pain 2011 per Dr. Sondra Come  . Deep vein thrombosis of bilateral lower extremities   . Family history of anesthesia complication     DAUGHTER CPR AFTER  . Cardiomyopathy   . Diabetes  mellitus     Steroid related  . UTI (urinary tract infection) 09/08/2013  . History of radiation therapy 08/30/13-09/20/13    20 gray to lower lumbar/upper sacrum    Past Surgical History  Procedure Laterality Date  . Abdominal hysterectomy    . Ankle fracture surgery Left     Family History  Problem Relation Age of Onset  . Diabetes Mother   . Hypertension Mother   . Arthritis Mother   . Stroke Mother   . Diabetes Father   . Hypertension Father   . Arthritis Father   . Stroke Father   . Cancer Sister     stomach  . Stroke Brother   . Cancer Brother     prostate <50  . Stroke Daughter   . Stroke Son   . Arthritis Other     Parents  . Cancer Other     Colon, 1st degree relative <60  . Diabetes Other     Parents  . Stroke Other     1st degree relative <60    History   Social History  . Marital Status: Widowed    Spouse Name: N/A    Number of Children: N/A  . Years of Education: N/A   Occupational History  . Retired    Social History Main Topics  . Smoking status: Former Smoker -- 1.00 packs/day for 13 years    Quit date: 09/01/1973  . Smokeless tobacco: Never Used     Comment: QIUT MANY YEARS AGO  . Alcohol Use: No  . Drug Use: No  . Sexual Activity: No   Other Topics Concern  . Not on file   Social History Narrative   Regular exercise:  Yes   Review of Systems Being treated for paronychia---improved now Sleeping fair with toe better again Appetite is not good    Objective:   Physical Exam  Constitutional: She appears well-developed and well-nourished. No distress.  Neck: Normal range of motion. No thyromegaly present.  Cardiovascular: Normal rate, regular rhythm and normal heart sounds.  Exam reveals no gallop.   No murmur heard. Pulmonary/Chest: Effort normal and breath sounds normal. No respiratory distress. She has no wheezes. She has no rales.  Musculoskeletal: She exhibits no edema or tenderness.  Lymphadenopathy:    She has no  cervical adenopathy.  Psychiatric: She has a normal mood and affect. Her behavior is normal.          Assessment & Plan:

## 2014-09-12 ENCOUNTER — Ambulatory Visit (HOSPITAL_BASED_OUTPATIENT_CLINIC_OR_DEPARTMENT_OTHER): Payer: Medicare Other

## 2014-09-12 ENCOUNTER — Ambulatory Visit: Payer: Medicare Other | Admitting: Pharmacist

## 2014-09-12 ENCOUNTER — Other Ambulatory Visit (HOSPITAL_BASED_OUTPATIENT_CLINIC_OR_DEPARTMENT_OTHER): Payer: Medicare Other

## 2014-09-12 ENCOUNTER — Other Ambulatory Visit: Payer: Medicare Other

## 2014-09-12 DIAGNOSIS — Z7901 Long term (current) use of anticoagulants: Secondary | ICD-10-CM

## 2014-09-12 DIAGNOSIS — Z5111 Encounter for antineoplastic chemotherapy: Secondary | ICD-10-CM

## 2014-09-12 DIAGNOSIS — C9000 Multiple myeloma not having achieved remission: Secondary | ICD-10-CM

## 2014-09-12 DIAGNOSIS — Z5112 Encounter for antineoplastic immunotherapy: Secondary | ICD-10-CM

## 2014-09-12 DIAGNOSIS — I82401 Acute embolism and thrombosis of unspecified deep veins of right lower extremity: Secondary | ICD-10-CM

## 2014-09-12 LAB — COMPREHENSIVE METABOLIC PANEL (CC13)
ALT: 13 U/L (ref 0–55)
AST: 15 U/L (ref 5–34)
Albumin: 3.3 g/dL — ABNORMAL LOW (ref 3.5–5.0)
Alkaline Phosphatase: 62 U/L (ref 40–150)
Anion Gap: 8 mEq/L (ref 3–11)
BILIRUBIN TOTAL: 0.39 mg/dL (ref 0.20–1.20)
BUN: 15.3 mg/dL (ref 7.0–26.0)
CALCIUM: 9.4 mg/dL (ref 8.4–10.4)
CO2: 29 mEq/L (ref 22–29)
Chloride: 105 mEq/L (ref 98–109)
Creatinine: 0.9 mg/dL (ref 0.6–1.1)
EGFR: 77 mL/min/{1.73_m2} — ABNORMAL LOW (ref >90)
Glucose: 113 mg/dl (ref 70–140)
Potassium: 3.9 mEq/L (ref 3.5–5.1)
SODIUM: 142 meq/L (ref 136–145)
TOTAL PROTEIN: 5.9 g/dL — AB (ref 6.4–8.3)

## 2014-09-12 LAB — PROTIME-INR
INR: 1.1 — AB (ref 2.00–3.50)
PROTIME: 13.2 s (ref 10.6–13.4)

## 2014-09-12 LAB — CBC WITH DIFFERENTIAL/PLATELET
BASO%: 0.4 % (ref 0.0–2.0)
BASOS ABS: 0 10*3/uL (ref 0.0–0.1)
EOS ABS: 0.1 10*3/uL (ref 0.0–0.5)
EOS%: 2.4 % (ref 0.0–7.0)
HEMATOCRIT: 32.6 % — AB (ref 34.8–46.6)
HEMOGLOBIN: 10.1 g/dL — AB (ref 11.6–15.9)
LYMPH%: 6.8 % — AB (ref 14.0–49.7)
MCH: 30.2 pg (ref 25.1–34.0)
MCHC: 30.8 g/dL — AB (ref 31.5–36.0)
MCV: 97.8 fL (ref 79.5–101.0)
MONO#: 0.5 10*3/uL (ref 0.1–0.9)
MONO%: 13.4 % (ref 0.0–14.0)
NEUT#: 3 10*3/uL (ref 1.5–6.5)
NEUT%: 77 % — ABNORMAL HIGH (ref 38.4–76.8)
PLATELETS: 156 10*3/uL (ref 145–400)
RBC: 3.33 10*6/uL — ABNORMAL LOW (ref 3.70–5.45)
RDW: 18.6 % — AB (ref 11.2–14.5)
WBC: 3.9 10*3/uL (ref 3.9–10.3)
lymph#: 0.3 10*3/uL — ABNORMAL LOW (ref 0.9–3.3)

## 2014-09-12 LAB — POCT INR: INR: 1.1

## 2014-09-12 MED ORDER — SODIUM CHLORIDE 0.9 % IV SOLN
300.0000 mg/m2 | Freq: Once | INTRAVENOUS | Status: AC
  Administered 2014-09-12: 440 mg via INTRAVENOUS
  Filled 2014-09-12: qty 22

## 2014-09-12 MED ORDER — DEXAMETHASONE SODIUM PHOSPHATE 10 MG/ML IJ SOLN
INTRAMUSCULAR | Status: AC
  Filled 2014-09-12: qty 1

## 2014-09-12 MED ORDER — DEXTROSE 5 % IV SOLN
36.0000 mg/m2 | Freq: Once | INTRAVENOUS | Status: AC
  Administered 2014-09-12: 54 mg via INTRAVENOUS
  Filled 2014-09-12: qty 27

## 2014-09-12 MED ORDER — ONDANSETRON 8 MG/50ML IVPB (CHCC)
8.0000 mg | Freq: Once | INTRAVENOUS | Status: AC
  Administered 2014-09-12: 8 mg via INTRAVENOUS

## 2014-09-12 MED ORDER — DEXTROSE 5 % IV SOLN: 36.0000 mg/m2 | Freq: Once | INTRAVENOUS | Status: DC

## 2014-09-12 MED ORDER — ONDANSETRON 8 MG/NS 50 ML IVPB
INTRAVENOUS | Status: AC
  Filled 2014-09-12: qty 8

## 2014-09-12 MED ORDER — DEXAMETHASONE SODIUM PHOSPHATE 10 MG/ML IJ SOLN
10.0000 mg | Freq: Once | INTRAMUSCULAR | Status: AC
  Administered 2014-09-12: 10 mg via INTRAVENOUS

## 2014-09-12 MED ORDER — SODIUM CHLORIDE 0.9 % IV SOLN
Freq: Once | INTRAVENOUS | Status: AC
  Administered 2014-09-12: 13:00:00 via INTRAVENOUS

## 2014-09-12 NOTE — Patient Instructions (Addendum)
[facility] Discharge Instructions for Patients Receiving Chemotherapy  Today you received the following chemotherapy agents: Kyprolis and Cytoxan.  To help prevent nausea and vomiting after your treatment, we encourage you to take your nausea medication, Compazine (Prochlorperazine). Take one every six hours as needed.   If you develop nausea and vomiting that is not controlled by your nausea medication, call the clinic.   BELOW ARE SYMPTOMS THAT SHOULD BE REPORTED IMMEDIATELY:  *FEVER GREATER THAN 100.5 F  *CHILLS WITH OR WITHOUT FEVER  NAUSEA AND VOMITING THAT IS NOT CONTROLLED WITH YOUR NAUSEA MEDICATION  *UNUSUAL SHORTNESS OF BREATH  *UNUSUAL BRUISING OR BLEEDING  TENDERNESS IN MOUTH AND THROAT WITH OR WITHOUT PRESENCE OF ULCERS  *URINARY PROBLEMS  *BOWEL PROBLEMS  UNUSUAL RASH Items with * indicate a potential emergency and should be followed up as soon as possible.  Feel free to call the clinic should you have any questions or concerns. The clinic phone number is [phone].

## 2014-09-12 NOTE — Progress Notes (Signed)
INR below goal today at 1.1 due to holding last week in anticipation of procedure Procedure did not happen due to low HR Coumadin resumed on Saturday after podiatrist evaluation and treatment (separate procedure) of foot on Friday 1/8 Pt has only had 3 doses of coumadin No signs/symptoms of clotting No unusual bleeding or bruising Diet/appetite is stable Will boost Ms. Barrick over the next few days then resume normal dose Plan: This week take 5 mg On Tues, Wed, and Thursday then resume to take 2.5 mg daily except 5 mg on Tuesdays and Thursdays.  Recheck PT/INR on 09/19/14; Lab at 1:45 pm,  treatment at 2:15pm.  We will see you in the treatment area.

## 2014-09-12 NOTE — Patient Instructions (Signed)
INR below goal due to holding last week This week take 5 mg On Tues, Wed, and Thursday then resume to take 2.5 mg daily except 5 mg on Tuesdays and Thursdays.  Recheck PT/INR on 09/19/14; Lab at 1:45 pm,  treatment at 2:15pm.  We will see you in the treatment area.

## 2014-09-13 ENCOUNTER — Ambulatory Visit (HOSPITAL_BASED_OUTPATIENT_CLINIC_OR_DEPARTMENT_OTHER): Payer: Medicare Other

## 2014-09-13 DIAGNOSIS — Z5112 Encounter for antineoplastic immunotherapy: Secondary | ICD-10-CM

## 2014-09-13 DIAGNOSIS — C9000 Multiple myeloma not having achieved remission: Secondary | ICD-10-CM | POA: Diagnosis not present

## 2014-09-13 MED ORDER — ONDANSETRON 8 MG/50ML IVPB (CHCC)
8.0000 mg | Freq: Once | INTRAVENOUS | Status: AC
Start: 2014-09-13 — End: 2014-09-13
  Administered 2014-09-13: 8 mg via INTRAVENOUS

## 2014-09-13 MED ORDER — ONDANSETRON 8 MG/NS 50 ML IVPB
INTRAVENOUS | Status: AC
  Filled 2014-09-13: qty 8

## 2014-09-13 MED ORDER — SODIUM CHLORIDE 0.9 % IV SOLN: Freq: Once | INTRAVENOUS | Status: DC

## 2014-09-13 MED ORDER — DEXTROSE 5 % IV SOLN
36.0000 mg/m2 | Freq: Once | INTRAVENOUS | Status: AC
  Administered 2014-09-13: 54 mg via INTRAVENOUS
  Filled 2014-09-13: qty 27

## 2014-09-13 MED ORDER — DEXAMETHASONE SODIUM PHOSPHATE 10 MG/ML IJ SOLN
INTRAMUSCULAR | Status: AC
  Filled 2014-09-13: qty 1

## 2014-09-13 MED ORDER — SODIUM CHLORIDE 0.9 % IV SOLN
Freq: Once | INTRAVENOUS | Status: AC
  Administered 2014-09-13: 14:00:00 via INTRAVENOUS

## 2014-09-13 MED ORDER — DEXAMETHASONE SODIUM PHOSPHATE 10 MG/ML IJ SOLN
10.0000 mg | Freq: Once | INTRAMUSCULAR | Status: AC
  Administered 2014-09-13: 10 mg via INTRAVENOUS

## 2014-09-13 NOTE — Patient Instructions (Signed)
[facility] Discharge Instructions for Patients Receiving Chemotherapy  Today you received the following chemotherapy agents Kyprolis  To help prevent nausea and vomiting after your treatment, we encourage you to take your nausea medication as prescribed   If you develop nausea and vomiting that is not controlled by your nausea medication, call the clinic.   BELOW ARE SYMPTOMS THAT SHOULD BE REPORTED IMMEDIATELY:  *FEVER GREATER THAN 100.5 F  *CHILLS WITH OR WITHOUT FEVER  NAUSEA AND VOMITING THAT IS NOT CONTROLLED WITH YOUR NAUSEA MEDICATION  *UNUSUAL SHORTNESS OF BREATH  *UNUSUAL BRUISING OR BLEEDING  TENDERNESS IN MOUTH AND THROAT WITH OR WITHOUT PRESENCE OF ULCERS  *URINARY PROBLEMS  *BOWEL PROBLEMS  UNUSUAL RASH Items with * indicate a potential emergency and should be followed up as soon as possible.  Feel free to call the clinic you have any questions or concerns. The clinic phone number is [phone].

## 2014-09-14 ENCOUNTER — Other Ambulatory Visit: Payer: Medicare Other

## 2014-09-14 ENCOUNTER — Ambulatory Visit: Payer: Medicare Other | Admitting: Physician Assistant

## 2014-09-14 ENCOUNTER — Ambulatory Visit: Payer: Medicare Other

## 2014-09-18 ENCOUNTER — Other Ambulatory Visit: Payer: Self-pay | Admitting: Medical Oncology

## 2014-09-18 ENCOUNTER — Encounter: Payer: Self-pay | Admitting: Pharmacist

## 2014-09-18 DIAGNOSIS — R05 Cough: Secondary | ICD-10-CM

## 2014-09-18 DIAGNOSIS — R058 Other specified cough: Secondary | ICD-10-CM

## 2014-09-18 MED ORDER — AZITHROMYCIN 250 MG PO TABS: ORAL_TABLET | ORAL | Status: DC

## 2014-09-18 NOTE — Progress Notes (Signed)
Received call from Abelina Bachelor, RN stating Dr. Julien Nordmann was starting pt on Z-pack today. I will leave her Coumadin dose the same w/o empiric dose changes since she is coming for INR check & visit w/ pharmacist tomorrow. Kennith Center, Pharm.D., CPP 09/18/2014@11 :51 AM

## 2014-09-18 NOTE — Progress Notes (Signed)
Daughter called and reported pt has productive cough with creamy white sputum. She denies fever or SOB. Per Julien Nordmann, Rx sent to local pharmacy and [place] notified in coumadin clinic.

## 2014-09-19 ENCOUNTER — Ambulatory Visit: Payer: Medicare Other

## 2014-09-19 ENCOUNTER — Encounter: Payer: Self-pay | Admitting: Physician Assistant

## 2014-09-19 ENCOUNTER — Ambulatory Visit (HOSPITAL_BASED_OUTPATIENT_CLINIC_OR_DEPARTMENT_OTHER): Payer: Self-pay | Admitting: Pharmacist

## 2014-09-19 ENCOUNTER — Telehealth: Payer: Self-pay | Admitting: Physician Assistant

## 2014-09-19 ENCOUNTER — Ambulatory Visit (HOSPITAL_BASED_OUTPATIENT_CLINIC_OR_DEPARTMENT_OTHER): Payer: Medicare Other | Admitting: Physician Assistant

## 2014-09-19 ENCOUNTER — Other Ambulatory Visit (HOSPITAL_BASED_OUTPATIENT_CLINIC_OR_DEPARTMENT_OTHER): Payer: Medicare Other

## 2014-09-19 VITALS — BP 136/49 | HR 81 | Temp 98.8°F | Resp 18 | Ht <= 58 in | Wt 117.2 lb

## 2014-09-19 DIAGNOSIS — J40 Bronchitis, not specified as acute or chronic: Secondary | ICD-10-CM

## 2014-09-19 DIAGNOSIS — C9002 Multiple myeloma in relapse: Secondary | ICD-10-CM

## 2014-09-19 DIAGNOSIS — Z7901 Long term (current) use of anticoagulants: Secondary | ICD-10-CM | POA: Diagnosis not present

## 2014-09-19 DIAGNOSIS — C9000 Multiple myeloma not having achieved remission: Secondary | ICD-10-CM

## 2014-09-19 DIAGNOSIS — I82401 Acute embolism and thrombosis of unspecified deep veins of right lower extremity: Secondary | ICD-10-CM | POA: Diagnosis not present

## 2014-09-19 LAB — HEPATIC FUNCTION PANEL
ALBUMIN: 3.3 g/dL — AB (ref 3.5–5.2)
ALK PHOS: 49 U/L (ref 39–117)
ALT: 24 U/L (ref 0–35)
AST: 18 U/L (ref 0–37)
Bilirubin, Direct: <0.1 mg/dL (ref 0.0–0.3)
TOTAL PROTEIN: 5.7 g/dL — AB (ref 6.0–8.3)
Total Bilirubin: 0.4 mg/dL (ref 0.2–1.2)

## 2014-09-19 LAB — CBC WITH DIFFERENTIAL/PLATELET
BASO%: 0.3 % (ref 0.0–2.0)
Basophils Absolute: 0 10*3/uL (ref 0.0–0.1)
EOS%: 4.5 % (ref 0.0–7.0)
Eosinophils Absolute: 0.1 10*3/uL (ref 0.0–0.5)
HCT: 32.2 % — ABNORMAL LOW (ref 34.8–46.6)
HGB: 9.8 g/dL — ABNORMAL LOW (ref 11.6–15.9)
LYMPH%: 4 % — ABNORMAL LOW (ref 14.0–49.7)
MCH: 29.4 pg (ref 25.1–34.0)
MCHC: 30.4 g/dL — ABNORMAL LOW (ref 31.5–36.0)
MCV: 96.6 fL (ref 79.5–101.0)
MONO#: 0.2 10*3/uL (ref 0.1–0.9)
MONO%: 9.2 % (ref 0.0–14.0)
NEUT#: 2.1 10*3/uL (ref 1.5–6.5)
NEUT%: 82 % — ABNORMAL HIGH (ref 38.4–76.8)
Platelets: 105 10*3/uL — ABNORMAL LOW (ref 145–400)
RBC: 3.33 10*6/uL — ABNORMAL LOW (ref 3.70–5.45)
RDW: 17.9 % — ABNORMAL HIGH (ref 11.2–14.5)
WBC: 2.6 10*3/uL — ABNORMAL LOW (ref 3.9–10.3)
lymph#: 0.1 10*3/uL — ABNORMAL LOW (ref 0.9–3.3)

## 2014-09-19 LAB — BASIC METABOLIC PANEL (CC13)
Anion Gap: 11 mEq/L (ref 3–11)
BUN: 12.4 mg/dL (ref 7.0–26.0)
CALCIUM: 8.4 mg/dL (ref 8.4–10.4)
CO2: 25 meq/L (ref 22–29)
Chloride: 104 mEq/L (ref 98–109)
Creatinine: 0.9 mg/dL (ref 0.6–1.1)
EGFR: 76 mL/min/{1.73_m2} — AB (ref >90)
Glucose: 160 mg/dl — ABNORMAL HIGH (ref 70–140)
Potassium: 3.3 mEq/L — ABNORMAL LOW (ref 3.5–5.1)
SODIUM: 139 meq/L (ref 136–145)

## 2014-09-19 LAB — PROTIME-INR
INR: 2.1 (ref 2.00–3.50)
Protime: 25.2 s — ABNORMAL HIGH (ref 10.6–13.4)

## 2014-09-19 LAB — POCT INR: INR: 2.1

## 2014-09-19 MED ORDER — OXYCODONE HCL 5 MG PO CAPS: 5.0000 mg | ORAL_CAPSULE | Freq: Four times a day (QID) | ORAL | Status: DC | PRN

## 2014-09-19 NOTE — Telephone Encounter (Signed)
Ubaldo Glassing Sent MD message to verify a appointment time. Will call with all appointments added.

## 2014-09-19 NOTE — Progress Notes (Signed)
Called Ernestene Kiel, Nutritionist left message pt requesting something other than ensure/ boost, pt experienced diarrhea with these drinks.

## 2014-09-19 NOTE — Patient Instructions (Signed)
We are holding chemotherapy this week due to your bronchitis. Complete your course of antibiotics as prescribed. Notify our office if your symptoms persist or worsen. Follow-up in 2 weeks prior to start of your next scheduled cycle of chemotherapy

## 2014-09-19 NOTE — Progress Notes (Addendum)
[place] [phone]   [phone]  OFFICE PROGRESS NOTE  Lindsay Stain, MD [place] [address]  DIAGNOSIS:  1. Multiple myeloma diagnosed in September 2009,  2. history of bilateral lower extremity deep vein thrombosis diagnosed in November 2009  3. acute on chronic deep vein thrombosis in the left lower extremity diagnosed in October 2010.   PRIOR THERAPY:  1. status post palliative radiotherapy to the right femur and ischail tuberosity, first was done in December 2011 and the second treatment was completed Jan 08 2011, and the third treatment to the right distal femur completed on 04/30/2011  2. status post palliative radiotherapy to the left proximal femur completed 02/28/2011.  3. Revlimid 25 mg by mouth daily for 21 days every 4 weeks in addition to oral Decadron at 40 mg by mouth on a weekly basis status post 40 cycles.  4. weekly subcutaneous Velcade 1.3 mg/M2 status post 60 cycles, discontinued today secondary to mild disease progression. 5. Pomalyst 4 mg by mouth daily for 21 days every 4 weeks, the patient started the first dose on 12/22/2012 , in addition to Decadron 40 mg weekly. Status post 13 cycles.  6. Chemotherapy according to the [facility] [id number] expanded access treatment protocol with Elotuzumab, lenalidomide and dexamthasone. Status post 5 cycles, discontinued today secondary to disease progression.   CURRENT THERAPY:  1. Systemic chemotherapy with Carfilzomib, Cytoxan and Decadron. First cycle 09/05/2014. 2. Coumadin followed by the Coumadin clinic at the [facility].  3. Zometa 4 mg IV given every 3 months.  INTERVAL HISTORY: [name] 79 y.o. female returns to the clinic today for follow-up visit accompanied by her daughter. The patient is tolerating her current treatment fairly well with no significant adverse effect fairly well. She is currently on a Z-Pak prescribed by our office for  productive cough. She continues to have the cough but feels it is starting to "break up". Her appetite is decreased and she has lost a little weight. She denies any fever or chills. She does feel a bit under the weather still from the "bronchitis".  She has no nausea or vomiting. The patient denied having any significant chest pain, shortness of breath,or hemoptysis. She presents to proceed with day 15 of cycle #1 of her systemic chemotherapy with carfilzomib, Cytoxan and Decadron.Marland Kitchen She will requests a refill for her oxycodone 5 mg tablets.  MEDICAL HISTORY: Past Medical History  Diagnosis Date  . Blood transfusion   . Depression     Mild  . History of chicken pox   . Hyperlipidemia   . Hypertension   . Degenerative joint disease   . Phlebitis   . Multiple myeloma     per Dr. Julien Nordmann, s/p palliative radiation for leg pain 2011 per Dr. Sondra Come  . Deep vein thrombosis of bilateral lower extremities   . Family history of anesthesia complication     DAUGHTER CPR AFTER  . Cardiomyopathy   . Diabetes mellitus     Steroid related  . UTI (urinary tract infection) 09/08/2013  . History of radiation therapy 08/30/13-09/20/13    20 gray to lower lumbar/upper sacrum    ALLERGIES:  has No Known Allergies.  MEDICATIONS:  Current Outpatient Prescriptions  Medication Sig Dispense Refill  . acyclovir (ZOVIRAX) 400 MG tablet TAKE ONE TABLET BY MOUTH TWICE DAILY 60 tablet 0  . ALPRAZolam (XANAX) 0.5 MG tablet Take 1 tablet (0.5 mg total) by mouth 2 (two)  times daily as needed for anxiety. 60 tablet 0  . Alum & Mag Hydroxide-Simeth (MAGIC MOUTHWASH W/LIDOCAINE) SOLN Take 5 mLs by mouth 3 (three) times daily as needed for mouth pain (do not swallow, swish and spit 33ml TID PRN). 120 mL 0  . azithromycin (ZITHROMAX Z-PAK) 250 MG tablet Take per package instructions 6 each 0  . Calcium Carbonate-Vitamin D 600-400 MG-UNIT per tablet Take 1 tablet by mouth 3 (three) times daily with meals.      Marland Kitchen  dexamethasone (DECADRON) 4 MG tablet Take 10 tab (40 mg) weekly except on days when you receive elotuzumab. On those days take 7 tab (28 mg) between 3 & 24 hours prior to the start of elotuzumab.    . diphenhydrAMINE (SOMINEX) 25 MG tablet Take 25 mg by mouth at bedtime as needed for sleep.    Marland Kitchen glipiZIDE (GLUCOTROL XL) 2.5 MG 24 hr tablet Take 2.5 mg by mouth daily with breakfast.    . ibuprofen (ADVIL) 200 MG tablet Take 400 mg by mouth every 6 (six) hours as needed for headache or moderate pain.    Marland Kitchen lenalidomide (REVLIMID) 25 MG capsule Take 1 capsule (25 mg total) by mouth daily. For 21 days, then rest for 7 days. repeat every 28 days. 21 capsule 0  . lisinopril-hydrochlorothiazide (PRINZIDE,ZESTORETIC) 20-12.5 MG per tablet TAKE ONE-HALF TABLET BY MOUTH ONCE DAILY 45 tablet 1  . loperamide (IMODIUM A-D) 2 MG tablet Take 1 tablet (2 mg total) by mouth 4 (four) times daily as needed for diarrhea or loose stools.    Marland Kitchen oxycodone (OXY-IR) 5 MG capsule Take 1 capsule (5 mg total) by mouth every 6 (six) hours as needed for pain (1 1/2 tablets every 6 hours prn pain). 90 capsule 0  . OxyCODONE (OXYCONTIN) 10 mg T12A 12 hr tablet Take 1 tablet (10 mg total) by mouth every 12 (twelve) hours. 60 tablet 0  . potassium chloride SA (K-DUR,KLOR-CON) 20 MEQ tablet Take 1 tablet (20 mEq total) by mouth as directed. 90meq x 7 days (Patient taking differently: Take 20 mEq by mouth daily. 3meq x 7 days) 7 tablet 0  . prochlorperazine (COMPAZINE) 10 MG tablet TAKE ONE TABLET BY MOUTH EVERY 6 HOURS AS NEEDED (Patient taking differently: TAKE ONE TABLET BY MOUTH EVERY 6 HOURS AS NEEDED FOR NAUSEA.) 30 tablet 0  . temazepam (RESTORIL) 30 MG capsule Take 1 capsule (30 mg total) by mouth at bedtime as needed for sleep. 3 capsule 0  . warfarin (COUMADIN) 5 MG tablet Take 2.5-5 mg by mouth daily. Takes 1(5mg ) tablet on mondays, Wednesday. Takes 1/2(2.5mg ) tablet on Tuesday,thursday, Friday, Saturday and sunday     No  current facility-administered medications for this visit.   Facility-Administered Medications Ordered in Other Visits  Medication Dose Route Frequency Provider Last Rate Last Dose  . Zoledronic Acid (ZOMETA) 4 mg IVPB  4 mg Intravenous Once Curt Bears, MD        SURGICAL HISTORY:  Past Surgical History  Procedure Laterality Date  . Abdominal hysterectomy    . Ankle fracture surgery Left     REVIEW OF SYSTEMS:  Constitutional: positive for anorexia, fatigue and malaise Eyes: negative Ears, nose, mouth, throat, and face: negative Respiratory: positive for cough and sputum Cardiovascular: negative Gastrointestinal: negative Genitourinary:negative Integument/breast: negative Hematologic/lymphatic: negative Musculoskeletal:positive for bone pain Neurological: negative Behavioral/Psych: negative Endocrine: negative Allergic/Immunologic: negative   PHYSICAL EXAMINATION: General appearance: alert, cooperative, no distress and but does appear ill Head: Normocephalic, without obvious abnormality,  atraumatic Neck: no adenopathy, no JVD, supple, symmetrical, trachea midline and thyroid not enlarged, symmetric, no tenderness/mass/nodules Lymph nodes: Cervical, supraclavicular, and axillary nodes normal. Resp: rhonchi bilaterally Back: symmetric, no curvature. ROM normal. No CVA tenderness. Cardio: regular rate and rhythm, S1, S2 normal, no murmur, click, rub or gallop GI: soft, non-tender; bowel sounds normal; no masses,  no organomegaly Extremities: extremities normal, atraumatic, no cyanosis or edema Neurologic: Alert and oriented X 3, normal strength and tone. Normal symmetric reflexes. Normal coordination and gait  ECOG PERFORMANCE STATUS: 1 - Symptomatic but completely ambulatory  Blood pressure 136/49, pulse 81, temperature 98.8 F (37.1 C), temperature source Oral, resp. rate 18, height $RemoveBe'4\' 10"'wjbMQJMAj$  (1.473 m), weight 117 lb 3.2 oz (53.162 kg).  LABORATORY DATA: Lab Results    Component Value Date   WBC 2.6* 09/19/2014   HGB 9.8* 09/19/2014   HCT 32.2* 09/19/2014   MCV 96.6 09/19/2014   PLT 105* 09/19/2014      Chemistry      Component Value Date/Time   NA 139 09/19/2014 1400   NA 139 09/07/2014 1624   K 3.3* 09/19/2014 1400   K 4.0 09/07/2014 1624   CL 109 09/07/2014 1624   CL 107 02/14/2013 1311   CO2 25 09/19/2014 1400   CO2 23 09/07/2014 1624   BUN 12.4 09/19/2014 1400   BUN 28* 09/07/2014 1624   CREATININE 0.9 09/19/2014 1400   CREATININE 1.12* 09/07/2014 1624      Component Value Date/Time   CALCIUM 8.4 09/19/2014 1400   CALCIUM 8.8 09/07/2014 1624   CALCIUM * 06/13/2008 1605    5.7 CRITICAL RESULT CALLED TO, READ BACK BY AND VERIFIED WITH: JAMIE TRACY,RN [id number] @ 8032 BY J SCOTTON (NOTE)  Amended report. Result repeated and verified. CORRECTED ON 10/14 AT 1429: PREVIOUSLY REPORTED AS Result repeated and verified.   ALKPHOS 62 09/12/2014 1149   ALKPHOS 54 05/18/2014 1308   AST 15 09/12/2014 1149   AST 20 05/18/2014 1308   ALT 13 09/12/2014 1149   ALT 17 05/18/2014 1308   BILITOT 0.39 09/12/2014 1149   BILITOT 0.4 05/18/2014 1308     Myeloma panel: Beta-2 microglobulin 3.30, free kappa Light chain 0.16, free lambda light chain 129.00 with kappa/lambda ratio of 0.00. IgG 349, IgA 29 and IgM 43.   RADIOGRAPHIC STUDIES: Dg Chest Port 1 View  09/07/2014   CLINICAL DATA:  Bradycardia.  Diabetes.  EXAM: PORTABLE CHEST - 1 VIEW  COMPARISON:  Single view of the chest 03/09/2014 and PA and lateral chest 09/07/2013.  FINDINGS: Lung volumes are low with some crowding of the bronchovascular structures. Heart size is upper normal. There is vascular congestion without frank edema. No consolidative process, pneumothorax or effusion is identified. Remote distal left clavicle fracture is noted.  IMPRESSION: No acute disease.   Electronically Signed   By: Inge Rise M.D.   On: 09/07/2014 16:09   [facility] Radiologist Eval & Mgmt  09/07/2014   INDICATION:  79 year old female with multiple myeloma has been referred for port catheter placement by Dr. Jethro Poling: LIMITED SONOGRAPHIC [place].  EVALUATION AND MANAGEMENT  MEDICATIONS: NONE  ANESTHESIA/SEDATION: None  CONTRAST:  None  COMPLICATIONS: None  PROCEDURE: Informed consent was obtained from the patient and the patient's family following an explanation of the procedure, risks, benefits and alternatives. The patient understands, agrees and consents for the procedure. All questions were addressed. A time out was performed prior to the initiation of the  procedure.  The patient was brought to the interventional radiology suite and placed in the supine position on the fluoroscopy table. Ultrasound survey of the right neck was performed for patency of the right internal jugular vein. Image was stored and sent to PACs.  Before initiating the procedure, and before initiation of sedation, the patient is recognize to be hypertensive.  In the room, the patient's blood pressure was 210/110. After treatment with 20 mg IV labetalol, the systolic blood pressure improved to 180.  [name] heart rate was bradycardic. The lowest heart rate recorded in the Interventional suite was 36.  With investigation into the patient's electronic records, it appears that the bradycardia is a new phenomenon with this patient. No prior diagnosis of bradycardia is present. One year prior to this date, heart rate was recorded in the 9 these. In December during office visits the patient's heart rate was 58.  The patient denies any current chest pain, dizziness, double vision, or headache. It appears she is asymptomatic from her bradycardia and her hypertension.  A direct conversation with Dr. Julien Nordmann relayed this information, and both myself and Dr. Julien Nordmann agree that the patient should not undergo the port catheter placement at this time. The patient will be transported to the emergency department for further  evaluation for her asymptomatic bradycardia and hypertension.  IMPRESSION: 79 year old female presenting for port catheter placement for multiple myeloma, discovered to have new asymptomatic bradycardia, as well as hypertension as high as 210/110.  The port catheter placement will be deferred to a later date. The patient will be transported to the [facility] emergency department in stable condition for further evaluation.  The plan of care was discussed with her primary oncologist Dr. Julien Nordmann, as well as the patient's family, who are in agreement.  Signed,  Dulcy Fanny. Earleen Newport, DO  [facility]  [facility]   Electronically Signed   By: Corrie Mckusick D.O.   On: 09/07/2014 17:31    ASSESSMENT AND PLAN: this is a very pleasant 79 years old African-American female with history of multiple myeloma status post several chemotherapy regimens lastly including systemic chemotherapy according to the [facility] CA 204143 clinical trial with Elotuzumab, Revlimid and dexamethasone. She status post 5 cycles and tolerating her treatment fairly well. This was discontinued secondary to disease progression. Unfortunately the recent myeloma panel showed significant increase in the free lambda light chain. She is currently being treated with systemic chemotherapy was Carfilzomib, Cytoxan and dexamethasone as salvage therapy. Status post days 1, 2, 7 and 8 of cycle #1. She presents today to proceed with day 15 of cycle #1 however as she is still ill from her current bout of bronchitis we will not proceed with day 15 and 16 of cycle #1 and allow her to get over her bout with bronchitis. She is to complete her Z-Pak as prescribed. She is given a refill prescription for her oxycodone 5 mg tablets a total of 90 with no refill. She was given a sample of Ensure clear to see if she can tolerate this better as a nutritional supplement. She does not tolerate the milk based Ensure or boost-type supplements  as they give her diarrhea. She'll follow-up in 2 weeks at the start of cycle #2 of her systemic chemotherapy with carfilzomib Cytoxan and dexamethasone.  She was advised to call immediately if she has any concerning symptoms in the interval. The patient voices understanding of current disease status and treatment options and is in agreement  with the current care plan.  All questions were answered. The patient knows to call the clinic with any problems, questions or concerns. We can certainly see the patient much sooner if necessary.  Carlton Adam, PA-C 09/19/2014  ADDENDUM:  Hematology/Oncology Attending:  I had a face to face encounter with the patient today. I recommended her care plan. This is a very pleasant 79 years old African-American female with relapsed multiple myeloma currently on treatment with Carfilzomib, Cytoxan and Decadron status post 2 weeks of the first cycle. She was supposed to start day 15 of the first cycle today but unfortunately the patient is currently treated for acute bronchitis with Z-Pak and not feeling well enough to proceed with her systemic therapy. I recommended for her to skip this week of her treatment. She would come back for follow-up visit in 2 weeks with the start of cycle #2. She was advised to call immediately if she has any concerning symptoms in the interval.  Disclaimer: This note was dictated with voice recognition software. Similar sounding words can inadvertently be transcribed and may be missed upon review. Eilleen Kempf., MD 09/19/2014

## 2014-09-20 ENCOUNTER — Ambulatory Visit: Payer: Medicare Other

## 2014-09-20 NOTE — Patient Instructions (Signed)
INR at goal No changes Continue to take 2.5 mg daily except 5 mg on Tuesdays and Thursdays.  Recheck PT/INR on 10/03/14; Lab at 10:45 am,  Coumadin clinic at 11am and MD visit at 11:15am.

## 2014-09-20 NOTE — Progress Notes (Signed)
INR at goal Pt seen by Adrena today No treatment this week Spoke to daughter over the phone Z-pack started 1/18. There is minimal chance of effect on INR. Will leave dose the same as INR is 2.1 today No unusual bleeding or bruising No missed or extra doses No other medication or diet changes Plan: Continue to take 2.5 mg daily except 5 mg on Tuesdays and Thursdays.  Recheck PT/INR on 10/03/14; Lab at 10:45 am,  Coumadin clinic at 11am and MD visit at 11:15am.

## 2014-09-21 ENCOUNTER — Encounter: Payer: Self-pay | Admitting: *Deleted

## 2014-09-21 NOTE — Progress Notes (Signed)
09/21/2014 1150  [name] Attempted to reach patient without success. Spoke with patient's daughter Jerilynn Mages re: need to inform patient of revised consent version 3 dated 07-26-14.  Explained that this is information being provided to keep patient's informed, even though patient is off study.  Informed patient's daughter that this RN would mail a copy of revised consent version 3 to her home and then will plan to meet with patient on 10/03/14 for re-consent at next MD visit.  [name] expressed appreciation and was without questions.  This RN thanked her for her time and mailed copy of consent to patient's home.

## 2014-09-25 ENCOUNTER — Telehealth: Payer: Self-pay | Admitting: *Deleted

## 2014-09-25 ENCOUNTER — Telehealth: Payer: Self-pay | Admitting: Nutrition

## 2014-09-25 ENCOUNTER — Other Ambulatory Visit: Payer: Self-pay

## 2014-09-25 ENCOUNTER — Telehealth: Payer: Self-pay | Admitting: Physician Assistant

## 2014-09-25 NOTE — Telephone Encounter (Signed)
Left message to confirm appointments for 02/02.

## 2014-09-25 NOTE — Telephone Encounter (Signed)
Per staff message and POF I have scheduled appts. Advised scheduler of appts. JMW  

## 2014-09-25 NOTE — Telephone Encounter (Signed)
Faxed refill request to [facility] for Revlimid with note that it had been discontinued due to disease progression.  Sent to scan.

## 2014-09-25 NOTE — Telephone Encounter (Signed)
Spoke with [name] who is patient's daughter. Patient had tried ensure and boost however this resulted in diarrhea. Educated patient to try ensure clear or boost breeze. Will leave patient samples at front desk for her to pick up on Wednesday, January 27. Will also provided coupons and purchasing information. Daughter was appreciative.  **Disclaimer: This note was dictated with voice recognition software. Similar sounding words can inadvertently be transcribed and this note may contain transcription errors which may not have been corrected upon publication of note.**

## 2014-09-25 NOTE — Telephone Encounter (Signed)
Nursing requested I contact patient about an alternative to Ensure or Boost secondary to diarrhea with patient consumption. Contacted mobile number which is daughter's cell phone. Daughter requested I contact patient after 2 pm when she will be home to assist her mother with phone.

## 2014-10-02 ENCOUNTER — Other Ambulatory Visit: Payer: Medicare Other

## 2014-10-02 ENCOUNTER — Ambulatory Visit: Payer: Medicare Other | Admitting: Physician Assistant

## 2014-10-03 ENCOUNTER — Encounter: Payer: Self-pay | Admitting: *Deleted

## 2014-10-03 ENCOUNTER — Ambulatory Visit (HOSPITAL_BASED_OUTPATIENT_CLINIC_OR_DEPARTMENT_OTHER): Payer: Medicare Other

## 2014-10-03 ENCOUNTER — Ambulatory Visit (HOSPITAL_BASED_OUTPATIENT_CLINIC_OR_DEPARTMENT_OTHER): Payer: Medicare Other | Admitting: Physician Assistant

## 2014-10-03 ENCOUNTER — Ambulatory Visit (HOSPITAL_BASED_OUTPATIENT_CLINIC_OR_DEPARTMENT_OTHER): Payer: Self-pay | Admitting: Pharmacist

## 2014-10-03 ENCOUNTER — Telehealth: Payer: Self-pay | Admitting: Physician Assistant

## 2014-10-03 ENCOUNTER — Other Ambulatory Visit (HOSPITAL_BASED_OUTPATIENT_CLINIC_OR_DEPARTMENT_OTHER): Payer: Medicare Other

## 2014-10-03 ENCOUNTER — Encounter: Payer: Self-pay | Admitting: Physician Assistant

## 2014-10-03 VITALS — BP 137/53 | HR 73 | Temp 98.0°F | Resp 18 | Ht <= 58 in | Wt 117.8 lb

## 2014-10-03 DIAGNOSIS — Z006 Encounter for examination for normal comparison and control in clinical research program: Secondary | ICD-10-CM

## 2014-10-03 DIAGNOSIS — Z7901 Long term (current) use of anticoagulants: Secondary | ICD-10-CM

## 2014-10-03 DIAGNOSIS — R001 Bradycardia, unspecified: Secondary | ICD-10-CM | POA: Diagnosis not present

## 2014-10-03 DIAGNOSIS — Z5112 Encounter for antineoplastic immunotherapy: Secondary | ICD-10-CM | POA: Diagnosis not present

## 2014-10-03 DIAGNOSIS — C9002 Multiple myeloma in relapse: Secondary | ICD-10-CM

## 2014-10-03 DIAGNOSIS — I10 Essential (primary) hypertension: Secondary | ICD-10-CM

## 2014-10-03 DIAGNOSIS — I82401 Acute embolism and thrombosis of unspecified deep veins of right lower extremity: Secondary | ICD-10-CM

## 2014-10-03 DIAGNOSIS — C9000 Multiple myeloma not having achieved remission: Secondary | ICD-10-CM

## 2014-10-03 LAB — CBC WITH DIFFERENTIAL/PLATELET
BASO%: 0.5 % (ref 0.0–2.0)
Basophils Absolute: 0 10*3/uL (ref 0.0–0.1)
EOS%: 0.7 % (ref 0.0–7.0)
Eosinophils Absolute: 0 10*3/uL (ref 0.0–0.5)
HCT: 34 % — ABNORMAL LOW (ref 34.8–46.6)
HGB: 10.5 g/dL — ABNORMAL LOW (ref 11.6–15.9)
LYMPH%: 7.8 % — AB (ref 14.0–49.7)
MCH: 29.9 pg (ref 25.1–34.0)
MCHC: 31 g/dL — AB (ref 31.5–36.0)
MCV: 96.5 fL (ref 79.5–101.0)
MONO#: 0.4 10*3/uL (ref 0.1–0.9)
MONO%: 11.4 % (ref 0.0–14.0)
NEUT#: 2.6 10*3/uL (ref 1.5–6.5)
NEUT%: 79.6 % — ABNORMAL HIGH (ref 38.4–76.8)
PLATELETS: 261 10*3/uL (ref 145–400)
RBC: 3.52 10*6/uL — ABNORMAL LOW (ref 3.70–5.45)
RDW: 18.5 % — ABNORMAL HIGH (ref 11.2–14.5)
WBC: 3.3 10*3/uL — ABNORMAL LOW (ref 3.9–10.3)
lymph#: 0.3 10*3/uL — ABNORMAL LOW (ref 0.9–3.3)

## 2014-10-03 LAB — PROTIME-INR
INR: 2.5 (ref 2.00–3.50)
Protime: 30 Seconds — ABNORMAL HIGH (ref 10.6–13.4)

## 2014-10-03 LAB — COMPREHENSIVE METABOLIC PANEL (CC13)
ALT: 14 U/L (ref 0–55)
ANION GAP: 11 meq/L (ref 3–11)
AST: 17 U/L (ref 5–34)
Albumin: 3.3 g/dL — ABNORMAL LOW (ref 3.5–5.0)
Alkaline Phosphatase: 66 U/L (ref 40–150)
BUN: 8.5 mg/dL (ref 7.0–26.0)
CHLORIDE: 102 meq/L (ref 98–109)
CO2: 30 mEq/L — ABNORMAL HIGH (ref 22–29)
Calcium: 9.1 mg/dL (ref 8.4–10.4)
Creatinine: 0.8 mg/dL (ref 0.6–1.1)
EGFR: 82 mL/min/{1.73_m2} — ABNORMAL LOW (ref >90)
Glucose: 173 mg/dl — ABNORMAL HIGH (ref 70–140)
Potassium: 3.5 mEq/L (ref 3.5–5.1)
Sodium: 143 mEq/L (ref 136–145)
TOTAL PROTEIN: 5.9 g/dL — AB (ref 6.4–8.3)
Total Bilirubin: 0.41 mg/dL (ref 0.20–1.20)

## 2014-10-03 LAB — POCT INR: INR: 2.5

## 2014-10-03 MED ORDER — DEXAMETHASONE SODIUM PHOSPHATE 10 MG/ML IJ SOLN
10.0000 mg | Freq: Once | INTRAMUSCULAR | Status: AC
  Administered 2014-10-03: 10 mg via INTRAVENOUS

## 2014-10-03 MED ORDER — ALPRAZOLAM 0.5 MG PO TABS: 0.5000 mg | ORAL_TABLET | Freq: Two times a day (BID) | ORAL | Status: DC | PRN

## 2014-10-03 MED ORDER — SODIUM CHLORIDE 0.9 % IV SOLN
Freq: Once | INTRAVENOUS | Status: AC
  Administered 2014-10-03: 13:00:00 via INTRAVENOUS

## 2014-10-03 MED ORDER — SODIUM CHLORIDE 0.9 % IV SOLN
300.0000 mg/m2 | Freq: Once | INTRAVENOUS | Status: AC
  Administered 2014-10-03: 440 mg via INTRAVENOUS
  Filled 2014-10-03: qty 22

## 2014-10-03 MED ORDER — DEXTROSE 5 % IV SOLN
36.0000 mg/m2 | Freq: Once | INTRAVENOUS | Status: AC
  Administered 2014-10-03: 54 mg via INTRAVENOUS
  Filled 2014-10-03: qty 27

## 2014-10-03 MED ORDER — DEXAMETHASONE SODIUM PHOSPHATE 10 MG/ML IJ SOLN
INTRAMUSCULAR | Status: AC
  Filled 2014-10-03: qty 1

## 2014-10-03 MED ORDER — ONDANSETRON 8 MG/NS 50 ML IVPB
INTRAVENOUS | Status: AC
  Filled 2014-10-03: qty 8

## 2014-10-03 MED ORDER — ONDANSETRON 8 MG/50ML IVPB (CHCC)
8.0000 mg | Freq: Once | INTRAVENOUS | Status: AC
  Administered 2014-10-03: 8 mg via INTRAVENOUS

## 2014-10-03 NOTE — Telephone Encounter (Signed)
Pt confirmed labs/ov per 02/02 POF, gave pt AVS.... KJ, sent msg to add chemo.Marland KitchenMarland KitchenMarland Kitchen

## 2014-10-03 NOTE — Patient Instructions (Signed)
[facility] Discharge Instructions for Patients Receiving Chemotherapy  Today you received the following chemotherapy agents: Kyprolis and cytoxan  To help prevent nausea and vomiting after your treatment, we encourage you to take your nausea medication:  Compazine 10 mg every 6 hours.   If you develop nausea and vomiting that is not controlled by your nausea medication, call the clinic.   BELOW ARE SYMPTOMS THAT SHOULD BE REPORTED IMMEDIATELY:  *FEVER GREATER THAN 100.5 F  *CHILLS WITH OR WITHOUT FEVER  NAUSEA AND VOMITING THAT IS NOT CONTROLLED WITH YOUR NAUSEA MEDICATION  *UNUSUAL SHORTNESS OF BREATH  *UNUSUAL BRUISING OR BLEEDING  TENDERNESS IN MOUTH AND THROAT WITH OR WITHOUT PRESENCE OF ULCERS  *URINARY PROBLEMS  *BOWEL PROBLEMS  UNUSUAL RASH Items with * indicate a potential emergency and should be followed up as soon as possible.  Feel free to call the clinic you have any questions or concerns. The clinic phone number is [phone].

## 2014-10-03 NOTE — Progress Notes (Signed)
Patient seen in the infusion area during treatment. INR = 2.5    Goal 2-3 INR within 2-3 goal range and stable. No complications of anticoagulation noted. She will continue to take 2.5 mg daily except 5 mg on Tuesdays and Thursdays.  We will recheck PT/INR on 10/10/14; Lab at 12:15 pm, treatment at 1pm. We will see her in the infusion area.  [name], PharmD

## 2014-10-03 NOTE — Progress Notes (Signed)
10/03/2014 1130 [name]  Re-consent visit. The research nurse met with the patient and her daughters today during her routine appointments to discuss the new consent version 3 dated 07/26/14. [name] end of treatment date was 08/23/14. The patient was informed that there is new information on the study which is included in the consent form. The patient and her daughters reviewed the new changes with the research nurse. The patient had no questions or concerns about the consent. The patient signed the new version 3 consent today, and she was given a copy for her records. This RN thanked the patient for her participation in the study.   [place] Nurse

## 2014-10-03 NOTE — Patient Instructions (Signed)
Continue labs and chemotherapy as scheduled Follow-up in 4 weeks prior to the start of cycle #3 or sooner as needed

## 2014-10-03 NOTE — Progress Notes (Addendum)
[facility] [phone]   [phone]  OFFICE PROGRESS NOTE  [place] Stain, [place] [address]  DIAGNOSIS:  1. Multiple myeloma diagnosed in September 2009,  2. history of bilateral lower extremity deep vein thrombosis diagnosed in November 2009  3. acute on chronic deep vein thrombosis in the left lower extremity diagnosed in October 2010.   PRIOR THERAPY:  1. status post palliative radiotherapy to the right femur and ischail tuberosity, first was done in December 2011 and the second treatment was completed Jan 08 2011, and the third treatment to the right distal femur completed on 04/30/2011  2. status post palliative radiotherapy to the left proximal femur completed 02/28/2011.  3. Revlimid 25 mg by mouth daily for 21 days every 4 weeks in addition to oral Decadron at 40 mg by mouth on a weekly basis status post 40 cycles.  4. weekly subcutaneous Velcade 1.3 mg/M2 status post 60 cycles, discontinued today secondary to mild disease progression. 5. Pomalyst 4 mg by mouth daily for 21 days every 4 weeks, the patient started the first dose on 12/22/2012 , in addition to Decadron 40 mg weekly. Status post 13 cycles.  6. Chemotherapy according to the [facility] [id number] expanded access treatment protocol with Elotuzumab, lenalidomide and dexamthasone. Status post 5 cycles, discontinued today secondary to disease progression.   CURRENT THERAPY:  1. Systemic chemotherapy with Carfilzomib, Cytoxan and Decadron. First cycle 09/05/2014. Status post 1 cycle 2. Coumadin followed by the Coumadin clinic at the [facility].  3. Zometa 4 mg IV given every 3 months.  INTERVAL HISTORY: [name] 78 y.o. female returns to the clinic today for follow-up visit accompanied by her daughters. The patient is tolerating her current treatment fairly well with no significant adverse effect fairly well. She requests a refill for her Xanax.    She has no nausea or vomiting. The patient denied having any significant chest pain, shortness of breath,or hemoptysis. She presents to proceed with day 1 of cycle #2 of her systemic chemotherapy with carfilzomib, Cytoxan and Decadron.Marland Kitchen   MEDICAL HISTORY: Past Medical History  Diagnosis Date  . Blood transfusion   . Depression     Mild  . History of chicken pox   . Hyperlipidemia   . Hypertension   . Degenerative joint disease   . Phlebitis   . Multiple myeloma     per Dr. Julien Nordmann, s/p palliative radiation for leg pain 2011 per Dr. Sondra Come  . Deep vein thrombosis of bilateral lower extremities   . Family history of anesthesia complication     DAUGHTER CPR AFTER  . Cardiomyopathy   . Diabetes mellitus     Steroid related  . UTI (urinary tract infection) 09/08/2013  . History of radiation therapy 08/30/13-09/20/13    20 gray to lower lumbar/upper sacrum    ALLERGIES:  has No Known Allergies.  MEDICATIONS:  Current Outpatient Prescriptions  Medication Sig Dispense Refill  . ALPRAZolam (XANAX) 0.5 MG tablet Take 1 tablet (0.5 mg total) by mouth 2 (two) times daily as needed for anxiety. 60 tablet 0  . ALPRAZolam (XANAX) 0.5 MG tablet Take 1 tablet (0.5 mg total) by mouth 2 (two) times daily as needed for anxiety. 60 tablet 0  . Calcium Carbonate-Vitamin D 600-400 MG-UNIT per tablet Take 1 tablet by mouth 3 (three) times daily with meals.      Marland Kitchen dexamethasone (DECADRON) 4 MG tablet Take 10 tab (40 mg)  weekly except on days when you receive elotuzumab. On those days take 7 tab (28 mg) between 3 & 24 hours prior to the start of elotuzumab.    . diphenhydrAMINE (SOMINEX) 25 MG tablet Take 25 mg by mouth at bedtime as needed for sleep.    Marland Kitchen glipiZIDE (GLUCOTROL XL) 2.5 MG 24 hr tablet Take 2.5 mg by mouth daily with breakfast.    . ibuprofen (ADVIL) 200 MG tablet Take 400 mg by mouth every 6 (six) hours as needed for headache or moderate pain.    Marland Kitchen lenalidomide (REVLIMID) 25 MG capsule  Take 1 capsule (25 mg total) by mouth daily. For 21 days, then rest for 7 days. repeat every 28 days. 21 capsule 0  . lisinopril-hydrochlorothiazide (PRINZIDE,ZESTORETIC) 20-12.5 MG per tablet TAKE ONE-HALF TABLET BY MOUTH ONCE DAILY 45 tablet 1  . loperamide (IMODIUM A-D) 2 MG tablet Take 1 tablet (2 mg total) by mouth 4 (four) times daily as needed for diarrhea or loose stools.    Marland Kitchen oxycodone (OXY-IR) 5 MG capsule Take 1 capsule (5 mg total) by mouth every 6 (six) hours as needed for pain (1 1/2 tablets every 6 hours prn pain). 90 capsule 0  . potassium chloride SA (K-DUR,KLOR-CON) 20 MEQ tablet Take 1 tablet (20 mEq total) by mouth as directed. 52meq x 7 days (Patient taking differently: Take 20 mEq by mouth daily. 90meq x 7 days) 7 tablet 0  . temazepam (RESTORIL) 30 MG capsule Take 1 capsule (30 mg total) by mouth at bedtime as needed for sleep. 3 capsule 0  . warfarin (COUMADIN) 5 MG tablet Take 2.5-5 mg by mouth daily. Takes 1(5mg ) tablet on mondays, Wednesday. Takes 1/2(2.5mg ) tablet on Tuesday,thursday, Friday, Saturday and sunday    . Alum & Mag Hydroxide-Simeth (MAGIC MOUTHWASH W/LIDOCAINE) SOLN Take 5 mLs by mouth 3 (three) times daily as needed for mouth pain (do not swallow, swish and spit 32ml TID PRN). (Patient not taking: Reported on 10/03/2014) 120 mL 0  . OxyCODONE (OXYCONTIN) 10 mg T12A 12 hr tablet Take 1 tablet (10 mg total) by mouth every 12 (twelve) hours. (Patient not taking: Reported on 10/03/2014) 60 tablet 0  . prochlorperazine (COMPAZINE) 10 MG tablet TAKE ONE TABLET BY MOUTH EVERY 6 HOURS AS NEEDED (Patient not taking: Reported on 10/03/2014) 30 tablet 0   No current facility-administered medications for this visit.   Facility-Administered Medications Ordered in Other Visits  Medication Dose Route Frequency Provider Last Rate Last Dose  . Zoledronic Acid (ZOMETA) 4 mg IVPB  4 mg Intravenous Once Curt Bears, MD        SURGICAL HISTORY:  Past Surgical History  Procedure  Laterality Date  . Abdominal hysterectomy    . Ankle fracture surgery Left     REVIEW OF SYSTEMS:  Constitutional: positive for anorexia, fatigue and malaise Eyes: negative Ears, nose, mouth, throat, and face: negative Respiratory: positive for cough and sputum Cardiovascular: negative Gastrointestinal: negative Genitourinary:negative Integument/breast: negative Hematologic/lymphatic: negative Musculoskeletal:positive for bone pain Neurological: negative Behavioral/Psych: negative Endocrine: negative Allergic/Immunologic: negative   PHYSICAL EXAMINATION: General appearance: alert, cooperative, no distress and but does appear ill Head: Normocephalic, without obvious abnormality, atraumatic Neck: no adenopathy, no JVD, supple, symmetrical, trachea midline and thyroid not enlarged, symmetric, no tenderness/mass/nodules Lymph nodes: Cervical, supraclavicular, and axillary nodes normal. Resp: rhonchi bilaterally Back: symmetric, no curvature. ROM normal. No CVA tenderness. Cardio: regular rate and rhythm, S1, S2 normal, no murmur, click, rub or gallop GI: soft, non-tender; bowel sounds normal;  no masses,  no organomegaly Extremities: extremities normal, atraumatic, no cyanosis or edema Neurologic: Alert and oriented X 3, normal strength and tone. Normal symmetric reflexes. Normal coordination and gait  ECOG PERFORMANCE STATUS: 1 - Symptomatic but completely ambulatory  Blood pressure 137/53, pulse 73, temperature 98 F (36.7 C), temperature source Oral, resp. rate 18, height _0  (1.473 m), weight 117 lb 12.8 oz (53.434 kg).  LABORATORY DATA: Lab Results  Component Value Date   WBC 3.3* 10/03/2014   HGB 10.5* 10/03/2014   HCT 34.0* 10/03/2014   MCV 96.5 10/03/2014   PLT 261 10/03/2014      Chemistry      Component Value Date/Time   NA 143 10/03/2014 1047   NA 139 09/07/2014 1624   K 3.5 10/03/2014 1047   K 4.0 09/07/2014 1624   CL 109 09/07/2014 1624   CL 107  02/14/2013 1311   CO2 30* 10/03/2014 1047   CO2 23 09/07/2014 1624   BUN 8.5 10/03/2014 1047   BUN 28* 09/07/2014 1624   CREATININE 0.8 10/03/2014 1047   CREATININE 1.12* 09/07/2014 1624      Component Value Date/Time   CALCIUM 9.1 10/03/2014 1047   CALCIUM 8.8 09/07/2014 1624   CALCIUM * 06/13/2008 1605    5.7 CRITICAL RESULT CALLED TO, READ BACK BY AND VERIFIED WITH: JAMIE TRACY,RN [id number] @ [address] (NOTE)  Amended report. Result repeated and verified. CORRECTED ON 10/14 AT 1429: PREVIOUSLY REPORTED AS Result repeated and verified.   ALKPHOS 66 10/03/2014 1047   ALKPHOS 49 09/19/2014 1447   AST 17 10/03/2014 1047   AST 18 09/19/2014 1447   ALT 14 10/03/2014 1047   ALT 24 09/19/2014 1447   BILITOT 0.41 10/03/2014 1047   BILITOT 0.4 09/19/2014 1447     Myeloma panel: Beta-2 microglobulin 3.30, free kappa Light chain 0.16, free lambda light chain 129.00 with kappa/lambda ratio of 0.00. IgG 349, IgA 29 and IgM 43.   RADIOGRAPHIC STUDIES: Dg Chest Port 1 View  09/07/2014   CLINICAL DATA:  Bradycardia.  Diabetes.  EXAM: PORTABLE CHEST - 1 VIEW  COMPARISON:  Single view of the chest 03/09/2014 and PA and lateral chest 09/07/2013.  FINDINGS: Lung volumes are low with some crowding of the bronchovascular structures. Heart size is upper normal. There is vascular congestion without frank edema. No consolidative process, pneumothorax or effusion is identified. Remote distal left clavicle fracture is noted.  IMPRESSION: No acute disease.   Electronically Signed   By: Inge Rise M.D.   On: 09/07/2014 16:09   [facility] Radiologist Eval & Mgmt  09/07/2014   INDICATION: 79 year old female with multiple myeloma has been referred for port catheter placement by Dr. Jethro Poling: LIMITED SONOGRAPHIC [place].  EVALUATION AND MANAGEMENT  MEDICATIONS: NONE  ANESTHESIA/SEDATION: None  CONTRAST:  None  COMPLICATIONS: None  PROCEDURE: Informed consent was obtained from  the patient and the patient's family following an explanation of the procedure, risks, benefits and alternatives. The patient understands, agrees and consents for the procedure. All questions were addressed. A time out was performed prior to the initiation of the procedure.  The patient was brought to the interventional radiology suite and placed in the supine position on the fluoroscopy table. Ultrasound survey of the right neck was performed for patency of the right internal jugular vein. Image was stored and sent to PACs.  Before initiating the procedure, and before initiation of sedation, the patient is recognize  to be hypertensive.  In the room, the patient's blood pressure was 210/110. After treatment with 20 mg IV labetalol, the systolic blood pressure improved to 180.  [name] heart rate was bradycardic. The lowest heart rate recorded in the Interventional suite was 36.  With investigation into the patient's electronic records, it appears that the bradycardia is a new phenomenon with this patient. No prior diagnosis of bradycardia is present. One year prior to this date, heart rate was recorded in the 9 these. In December during office visits the patient's heart rate was 58.  The patient denies any current chest pain, dizziness, double vision, or headache. It appears she is asymptomatic from her bradycardia and her hypertension.  A direct conversation with Dr. Julien Nordmann relayed this information, and both myself and Dr. Julien Nordmann agree that the patient should not undergo the port catheter placement at this time. The patient will be transported to the emergency department for further evaluation for her asymptomatic bradycardia and hypertension.  IMPRESSION: 79 year old female presenting for port catheter placement for multiple myeloma, discovered to have new asymptomatic bradycardia, as well as hypertension as high as 210/110.  The port catheter placement will be deferred to a later date. The patient will be  transported to the [facility] emergency department in stable condition for further evaluation.  The plan of care was discussed with her primary oncologist Dr. Julien Nordmann, as well as the patient's family, who are in agreement.  Signed,  Dulcy Fanny. Earleen Newport, DO  [facility]  [facility]   Electronically Signed   By: Corrie Mckusick D.O.   On: 09/07/2014 17:31    ASSESSMENT AND PLAN: this is a very pleasant 79 years old African-American female with history of multiple myeloma status post several chemotherapy regimens lastly including systemic chemotherapy according to the [facility] CA 204143 clinical trial with Elotuzumab, Revlimid and dexamethasone. She status post 5 cycles and tolerating her treatment fairly well. This was discontinued secondary to disease progression. Unfortunately the recent myeloma panel showed significant increase in the free lambda light chain. She is currently being treated with systemic chemotherapy was Carfilzomib, Cytoxan and dexamethasone as salvage therapy. Status post 1 cycle. Overall she tolerated the first cycle relatively well. Day 15 and 16 of cycle #1 were omitted to allow her to recover from a bout of bronchitis. She presents today to proceed with day 1 of cycle #2. Patient was discussed with and also seen by Dr. Julien Nordmann. She will proceed with cycle #2 today as scheduled. She was given a refill prescription for her Xanax tablets. She will continue with cycle #2 as scheduled and follow-up in one month prior to the start of cycle #3.  She was advised to call immediately if she has any concerning symptoms in the interval. The patient voices understanding of current disease status and treatment options and is in agreement with the current care plan.  All questions were answered. The patient knows to call the clinic with any problems, questions or concerns. We can certainly see the patient much sooner if necessary.  Carlton Adam,  PA-C 10/03/2014  ADDENDUM: Hematology/Oncology Attending: This is a very pleasant 79 years old African-American female with relapsed multiple myeloma currently undergoing systemic chemotherapy was Carfilzomib, Cytoxan and dexamethasone status post 1 cycle. She tolerated the first cycle of her treatment fairly well. She was supposed to have a Port-A-Cath placed before this cycle but unfortunately the patient had significant hypertension as well as bradycardia at the time of  the procedure which was aborted. She felt much better when evaluated at the emergency department at that time with resolution of her symptoms. I recommended for the patient to proceed with her treatment today as a scheduled. We will reschedule his Port-A-Cath placement by interventional radiology. She would come back for follow-up visit in 4 weeks with the next cycle of her treatment. She was advised to call immediately if she has any concerning symptoms in the interval.  Disclaimer: This note was dictated with voice recognition software. Similar sounding words can inadvertently be transcribed and may be missed upon review. Eilleen Kempf., MD 10/03/2014

## 2014-10-04 ENCOUNTER — Telehealth: Payer: Self-pay | Admitting: *Deleted

## 2014-10-04 ENCOUNTER — Ambulatory Visit (HOSPITAL_BASED_OUTPATIENT_CLINIC_OR_DEPARTMENT_OTHER): Payer: Medicare Other

## 2014-10-04 DIAGNOSIS — Z5112 Encounter for antineoplastic immunotherapy: Secondary | ICD-10-CM | POA: Diagnosis not present

## 2014-10-04 DIAGNOSIS — C9002 Multiple myeloma in relapse: Secondary | ICD-10-CM | POA: Diagnosis not present

## 2014-10-04 DIAGNOSIS — C9000 Multiple myeloma not having achieved remission: Secondary | ICD-10-CM

## 2014-10-04 MED ORDER — DEXTROSE 5 % IV SOLN
36.0000 mg/m2 | Freq: Once | INTRAVENOUS | Status: AC
  Administered 2014-10-04: 54 mg via INTRAVENOUS
  Filled 2014-10-04: qty 27

## 2014-10-04 MED ORDER — SODIUM CHLORIDE 0.9 % IV SOLN
Freq: Once | INTRAVENOUS | Status: AC
  Administered 2014-10-04: 16:00:00 via INTRAVENOUS

## 2014-10-04 MED ORDER — SODIUM CHLORIDE 0.9 % IV SOLN
Freq: Once | INTRAVENOUS | Status: AC
  Administered 2014-10-04: 17:00:00 via INTRAVENOUS

## 2014-10-04 MED ORDER — DEXAMETHASONE SODIUM PHOSPHATE 10 MG/ML IJ SOLN
10.0000 mg | Freq: Once | INTRAMUSCULAR | Status: AC
  Administered 2014-10-04: 10 mg via INTRAVENOUS

## 2014-10-04 MED ORDER — ONDANSETRON 8 MG/50ML IVPB (CHCC)
8.0000 mg | Freq: Once | INTRAVENOUS | Status: AC
  Administered 2014-10-04: 8 mg via INTRAVENOUS

## 2014-10-04 MED ORDER — DEXAMETHASONE SODIUM PHOSPHATE 10 MG/ML IJ SOLN
INTRAMUSCULAR | Status: AC
  Filled 2014-10-04: qty 1

## 2014-10-04 NOTE — Patient Instructions (Signed)
[facility] Discharge Instructions for Patients Receiving Chemotherapy  Today you received the following chemotherapy agents: Kyprolis.  To help prevent nausea and vomiting after your treatment, we encourage you to take your nausea medication:  Compazine 10 mg every 6 hours.   If you develop nausea and vomiting that is not controlled by your nausea medication, call the clinic.   BELOW ARE SYMPTOMS THAT SHOULD BE REPORTED IMMEDIATELY:  *FEVER GREATER THAN 100.5 F  *CHILLS WITH OR WITHOUT FEVER  NAUSEA AND VOMITING THAT IS NOT CONTROLLED WITH YOUR NAUSEA MEDICATION  *UNUSUAL SHORTNESS OF BREATH  *UNUSUAL BRUISING OR BLEEDING  TENDERNESS IN MOUTH AND THROAT WITH OR WITHOUT PRESENCE OF ULCERS  *URINARY PROBLEMS  *BOWEL PROBLEMS  UNUSUAL RASH Items with * indicate a potential emergency and should be followed up as soon as possible.  Feel free to call the clinic you have any questions or concerns. The clinic phone number is [phone].

## 2014-10-04 NOTE — Telephone Encounter (Signed)
Per staff message and POF I have scheduled appts. Advised scheduler of appts. JMW  

## 2014-10-05 ENCOUNTER — Other Ambulatory Visit: Payer: Self-pay | Admitting: Medical Oncology

## 2014-10-05 ENCOUNTER — Telehealth: Payer: Self-pay | Admitting: *Deleted

## 2014-10-05 ENCOUNTER — Other Ambulatory Visit: Payer: Self-pay | Admitting: Internal Medicine

## 2014-10-05 ENCOUNTER — Other Ambulatory Visit: Payer: Self-pay | Admitting: *Deleted

## 2014-10-05 DIAGNOSIS — N189 Chronic kidney disease, unspecified: Secondary | ICD-10-CM | POA: Diagnosis not present

## 2014-10-05 DIAGNOSIS — C9000 Multiple myeloma not having achieved remission: Secondary | ICD-10-CM

## 2014-10-05 DIAGNOSIS — I129 Hypertensive chronic kidney disease with stage 1 through stage 4 chronic kidney disease, or unspecified chronic kidney disease: Secondary | ICD-10-CM | POA: Diagnosis not present

## 2014-10-05 DIAGNOSIS — I82402 Acute embolism and thrombosis of unspecified deep veins of left lower extremity: Secondary | ICD-10-CM

## 2014-10-05 MED ORDER — WARFARIN SODIUM 5 MG PO TABS: 2.5000 mg | ORAL_TABLET | Freq: Every day | ORAL | Status: DC

## 2014-10-05 MED ORDER — OXYCODONE HCL 5 MG PO CAPS: 5.0000 mg | ORAL_CAPSULE | Freq: Four times a day (QID) | ORAL | Status: DC | PRN

## 2014-10-05 NOTE — Telephone Encounter (Signed)
Per staff message I have moved appts to earlier in the day. I have called and left the daughter a message

## 2014-10-05 NOTE — Telephone Encounter (Signed)
NO NOTE

## 2014-10-05 NOTE — Telephone Encounter (Signed)
OXYCODONE WAS LAST FILLED ON 09/19/14. PT.'S DAUGHTER TO PICK UP THIS AFTERNOON BEFORE 4:00PM. MAY CALL WARFARIN INTO PHARMACY.

## 2014-10-10 ENCOUNTER — Other Ambulatory Visit (HOSPITAL_BASED_OUTPATIENT_CLINIC_OR_DEPARTMENT_OTHER): Payer: Medicare Other

## 2014-10-10 ENCOUNTER — Ambulatory Visit (HOSPITAL_BASED_OUTPATIENT_CLINIC_OR_DEPARTMENT_OTHER): Payer: Self-pay | Admitting: Pharmacist

## 2014-10-10 ENCOUNTER — Ambulatory Visit (HOSPITAL_BASED_OUTPATIENT_CLINIC_OR_DEPARTMENT_OTHER): Payer: Medicare Other

## 2014-10-10 ENCOUNTER — Encounter: Payer: Self-pay | Admitting: Internal Medicine

## 2014-10-10 DIAGNOSIS — C9000 Multiple myeloma not having achieved remission: Secondary | ICD-10-CM

## 2014-10-10 DIAGNOSIS — Z5112 Encounter for antineoplastic immunotherapy: Secondary | ICD-10-CM

## 2014-10-10 DIAGNOSIS — I82502 Chronic embolism and thrombosis of unspecified deep veins of left lower extremity: Secondary | ICD-10-CM | POA: Diagnosis not present

## 2014-10-10 DIAGNOSIS — I82402 Acute embolism and thrombosis of unspecified deep veins of left lower extremity: Secondary | ICD-10-CM

## 2014-10-10 DIAGNOSIS — Z7901 Long term (current) use of anticoagulants: Secondary | ICD-10-CM

## 2014-10-10 LAB — CBC WITH DIFFERENTIAL/PLATELET
BASO%: 0 % (ref 0.0–2.0)
Basophils Absolute: 0 10*3/uL (ref 0.0–0.1)
EOS%: 0.5 % (ref 0.0–7.0)
Eosinophils Absolute: 0 10*3/uL (ref 0.0–0.5)
HCT: 32.3 % — ABNORMAL LOW (ref 34.8–46.6)
HGB: 10.2 g/dL — ABNORMAL LOW (ref 11.6–15.9)
LYMPH%: 6.2 % — ABNORMAL LOW (ref 14.0–49.7)
MCH: 30.7 pg (ref 25.1–34.0)
MCHC: 31.6 g/dL (ref 31.5–36.0)
MCV: 97.3 fL (ref 79.5–101.0)
MONO#: 0.3 10*3/uL (ref 0.1–0.9)
MONO%: 6.7 % (ref 0.0–14.0)
NEUT#: 3.4 10*3/uL (ref 1.5–6.5)
NEUT%: 86.6 % — ABNORMAL HIGH (ref 38.4–76.8)
Platelets: 152 10*3/uL (ref 145–400)
RBC: 3.32 10*6/uL — AB (ref 3.70–5.45)
RDW: 17.1 % — AB (ref 11.2–14.5)
WBC: 3.9 10*3/uL (ref 3.9–10.3)
lymph#: 0.2 10*3/uL — ABNORMAL LOW (ref 0.9–3.3)

## 2014-10-10 LAB — POCT INR: INR: 2.2

## 2014-10-10 LAB — COMPREHENSIVE METABOLIC PANEL (CC13)
ALK PHOS: 65 U/L (ref 40–150)
ALT: 13 U/L (ref 0–55)
AST: 13 U/L (ref 5–34)
Albumin: 3.2 g/dL — ABNORMAL LOW (ref 3.5–5.0)
Anion Gap: 11 mEq/L (ref 3–11)
BUN: 13.1 mg/dL (ref 7.0–26.0)
CO2: 29 mEq/L (ref 22–29)
Calcium: 9.5 mg/dL (ref 8.4–10.4)
Chloride: 104 mEq/L (ref 98–109)
Creatinine: 0.8 mg/dL (ref 0.6–1.1)
EGFR: 79 mL/min/{1.73_m2} — AB (ref >90)
GLUCOSE: 201 mg/dL — AB (ref 70–140)
Potassium: 3.4 mEq/L — ABNORMAL LOW (ref 3.5–5.1)
Sodium: 144 mEq/L (ref 136–145)
Total Bilirubin: 0.45 mg/dL (ref 0.20–1.20)
Total Protein: 5.6 g/dL — ABNORMAL LOW (ref 6.4–8.3)

## 2014-10-10 LAB — PROTIME-INR
INR: 2.2 (ref 2.00–3.50)
PROTIME: 26.4 s — AB (ref 10.6–13.4)

## 2014-10-10 MED ORDER — DEXAMETHASONE SODIUM PHOSPHATE 10 MG/ML IJ SOLN
INTRAMUSCULAR | Status: AC
  Filled 2014-10-10: qty 1

## 2014-10-10 MED ORDER — DEXTROSE 5 % IV SOLN
36.0000 mg/m2 | Freq: Once | INTRAVENOUS | Status: AC
  Administered 2014-10-10: 54 mg via INTRAVENOUS
  Filled 2014-10-10: qty 27

## 2014-10-10 MED ORDER — DEXAMETHASONE SODIUM PHOSPHATE 10 MG/ML IJ SOLN
10.0000 mg | Freq: Once | INTRAMUSCULAR | Status: AC
  Administered 2014-10-10: 10 mg via INTRAVENOUS

## 2014-10-10 MED ORDER — ONDANSETRON 8 MG/NS 50 ML IVPB
INTRAVENOUS | Status: AC
  Filled 2014-10-10: qty 8

## 2014-10-10 MED ORDER — SODIUM CHLORIDE 0.9 % IV SOLN
Freq: Once | INTRAVENOUS | Status: AC
  Administered 2014-10-10: 13:00:00 via INTRAVENOUS

## 2014-10-10 MED ORDER — CYCLOPHOSPHAMIDE CHEMO INJECTION 1 GM
300.0000 mg/m2 | Freq: Once | INTRAMUSCULAR | Status: AC
  Administered 2014-10-10: 440 mg via INTRAVENOUS
  Filled 2014-10-10: qty 22

## 2014-10-10 MED ORDER — ONDANSETRON 8 MG/50ML IVPB (CHCC)
8.0000 mg | Freq: Once | INTRAVENOUS | Status: AC
  Administered 2014-10-10: 8 mg via INTRAVENOUS

## 2014-10-10 NOTE — Progress Notes (Signed)
INR = 2.2    Goal 2-3 INR is within goal range.   Patient seen in infusion area. No complications of anticoagulation noted. She will continue to take 2.5 mg daily except 5 mg on Tuesdays and Thursdays.  We will recheck PT/INR on 10/17/14; Lab at 11:30 am, treatment at 12:15. We will see her in the infusion area.  Theone Murdoch, PharmD

## 2014-10-10 NOTE — Progress Notes (Signed)
Patient denied asst with [facility]MS with [employer]. I will send copy to medical records.

## 2014-10-11 ENCOUNTER — Ambulatory Visit (HOSPITAL_BASED_OUTPATIENT_CLINIC_OR_DEPARTMENT_OTHER): Payer: Medicare Other

## 2014-10-11 DIAGNOSIS — Z5112 Encounter for antineoplastic immunotherapy: Secondary | ICD-10-CM | POA: Diagnosis not present

## 2014-10-11 DIAGNOSIS — C9000 Multiple myeloma not having achieved remission: Secondary | ICD-10-CM | POA: Diagnosis not present

## 2014-10-11 MED ORDER — DEXTROSE 5 % IV SOLN
36.0000 mg/m2 | Freq: Once | INTRAVENOUS | Status: DC
  Filled 2014-10-11: qty 27

## 2014-10-11 MED ORDER — ONDANSETRON 8 MG/NS 50 ML IVPB
INTRAVENOUS | Status: AC
  Filled 2014-10-11: qty 8

## 2014-10-11 MED ORDER — DEXAMETHASONE SODIUM PHOSPHATE 10 MG/ML IJ SOLN
INTRAMUSCULAR | Status: AC
  Filled 2014-10-11: qty 1

## 2014-10-11 MED ORDER — DEXTROSE 5 % IV SOLN
36.0000 mg/m2 | Freq: Once | INTRAVENOUS | Status: AC
  Administered 2014-10-11: 54 mg via INTRAVENOUS
  Filled 2014-10-11: qty 27

## 2014-10-11 MED ORDER — DEXAMETHASONE SODIUM PHOSPHATE 10 MG/ML IJ SOLN
10.0000 mg | Freq: Once | INTRAMUSCULAR | Status: AC
  Administered 2014-10-11: 10 mg via INTRAVENOUS

## 2014-10-11 MED ORDER — SODIUM CHLORIDE 0.9 % IV SOLN
Freq: Once | INTRAVENOUS | Status: AC
  Administered 2014-10-11: 13:00:00 via INTRAVENOUS

## 2014-10-11 MED ORDER — ONDANSETRON 8 MG/50ML IVPB (CHCC)
8.0000 mg | Freq: Once | INTRAVENOUS | Status: AC
  Administered 2014-10-11: 8 mg via INTRAVENOUS

## 2014-10-11 NOTE — Patient Instructions (Addendum)
[facility] Discharge Instructions for Patients Receiving Chemotherapy  Today you received the following chemotherapy agents:  Kyprolis  To help prevent nausea and vomiting after your treatment, we encourage you to take your nausea medication as ordered per MD.   If you develop nausea and vomiting that is not controlled by your nausea medication, call the clinic.   BELOW ARE SYMPTOMS THAT SHOULD BE REPORTED IMMEDIATELY: *FEVER GREATER THAN 100.5 F *CHILLS WITH OR WITHOUT FEVER NAUSEA AND VOMITING THAT IS NOT CONTROLLED WITH YOUR NAUSEA MEDICATION *UNUSUAL SHORTNESS OF BREATH *UNUSUAL BRUISING OR BLEEDING TENDERNESS IN MOUTH AND THROAT WITH OR WITHOUT PRESENCE OF ULCERS *URINARY PROBLEMS *BOWEL PROBLEMS UNUSUAL RASH Items with * indicate a potential emergency and should be followed up as soon as possible.  Feel free to call the clinic you have any questions or concerns. The clinic phone number is [phone].   Potassium Content of Foods Potassium is a mineral found in many foods and drinks. It helps keep fluids and minerals balanced in your body and affects how steadily your heart beats. Potassium also helps control your blood pressure and keep your muscles and nervous system healthy. Certain health conditions and medicines may change the balance of potassium in your body. When this happens, you can help balance your level of potassium through the foods that you do or do not eat. Your health care provider or dietitian may recommend an amount of potassium that you should have each day. The following lists of foods provide the amount of potassium (in parentheses) per serving in each item. HIGH IN POTASSIUM  The following foods and beverages have 200 mg or more of potassium per serving:  Apricots, 2 raw or 5 dry (200 mg).  Artichoke, 1 medium (345 mg).  Avocado, raw,  each (245 mg).  Banana, 1 medium (425 mg).  Beans, lima, or baked beans, canned,  cup (280  mg).  Beans, white, canned,  cup (595 mg).  Beef roast, 3 oz (320 mg).  Beef, ground, 3 oz (270 mg).  Beets, raw or cooked,  cup (260 mg).  Bran muffin, 2 oz (300 mg).  Broccoli,  cup (230 mg).  Brussels sprouts,  cup (250 mg).  Cantaloupe,  cup (215 mg).  Cereal, 100% bran,  cup (200-400 mg).  Cheeseburger, single, fast food, 1 each (225-400 mg).  Chicken, 3 oz (220 mg).  Clams, canned, 3 oz (535 mg).  Crab, 3 oz (225 mg).  Dates, 5 each (270 mg).  Dried beans and peas,  cup (300-475 mg).  Figs, dried, 2 each (260 mg).  Fish: halibut, tuna, cod, snapper, 3 oz (480 mg).  Fish: salmon, haddock, swordfish, perch, 3 oz (300 mg).  Fish, tuna, canned 3 oz (200 mg).  [place] fries, fast food, 3 oz (470 mg).  Granola with fruit and nuts,  cup (200 mg).  Grapefruit juice,  cup (200 mg).  Greens, beet,  cup (655 mg).  Honeydew melon,  cup (200 mg).  Kale, raw, 1 cup (300 mg).  Kiwi, 1 medium (240 mg).  Kohlrabi, rutabaga, parsnips,  cup (280 mg).  Lentils,  cup (365 mg).  Mango, 1 each (325 mg).  Milk, chocolate, 1 cup (420 mg).  Milk: nonfat, low-fat, whole, buttermilk, 1 cup (350-380 mg).  Molasses, 1 Tbsp (295 mg).  Mushrooms,  cup (280) mg.  Nectarine, 1 each (275 mg).  Nuts: almonds, peanuts, hazelnuts, [place], cashew, mixed, 1 oz (200 mg).  Nuts, pistachios, 1 oz (295 mg).  Orange, 1 each (240 mg).  Orange juice,  cup (235 mg).  Papaya, medium,  fruit (390 mg).  Peanut butter, chunky, 2 Tbsp (240 mg).  Peanut butter, smooth, 2 Tbsp (210 mg).  Pear, 1 medium (200 mg).  Pomegranate, 1 whole (400 mg).  Pomegranate juice,  cup (215 mg).  Pork, 3 oz (350 mg).  Potato chips, salted, 1 oz (465 mg).  Potato, baked with skin, 1 medium (925 mg).  Potatoes, boiled,  cup (255 mg).  Potatoes, mashed,  cup (330 mg).  Prune juice,  cup (370 mg).  Prunes, 5 each (305 mg).  Pudding, chocolate,  cup (230  mg).  Pumpkin, canned,  cup (250 mg).  Raisins, seedless,  cup (270 mg).  Seeds, sunflower or pumpkin, 1 oz (240 mg).  Soy milk, 1 cup (300 mg).  Spinach,  cup (420 mg).  Spinach, canned,  cup (370 mg).  Sweet potato, baked with skin, 1 medium (450 mg).  Swiss chard,  cup (480 mg).  Tomato or vegetable juice,  cup (275 mg).  Tomato sauce or puree,  cup (400-550 mg).  Tomato, raw, 1 medium (290 mg).  Tomatoes, canned,  cup (200-300 mg).  [place], 3 oz (250 mg).  Wheat germ, 1 oz (250 mg).  Winter squash,  cup (250 mg).  Yogurt, plain or fruited, 6 oz (260-435 mg).  Zucchini,  cup (220 mg). MODERATE IN POTASSIUM The following foods and beverages have 50-200 mg of potassium per serving:  Apple, 1 each (150 mg).  Apple juice,  cup (150 mg).  Applesauce,  cup (90 mg).  Apricot nectar,  cup (140 mg).  Asparagus, small spears,  cup or 6 spears (155 mg).  Bagel, cinnamon raisin, 1 each (130 mg).  Bagel, egg or plain, 4 in., 1 each (70 mg).  Beans, green,  cup (90 mg).  Beans, yellow,  cup (190 mg).  Beer, regular, 12 oz (100 mg).  Beets, canned,  cup (125 mg).  Blackberries,  cup (115 mg).  Blueberries,  cup (60 mg).  Bread, whole wheat, 1 slice (70 mg).  Broccoli, raw,  cup (145 mg).  Cabbage,  cup (150 mg).  Carrots, cooked or raw,  cup (180 mg).  Cauliflower, raw,  cup (150 mg).  Celery, raw,  cup (155 mg).  Cereal, bran flakes, cup (120-150 mg).  Cheese, cottage,  cup (110 mg).  Cherries, 10 each (150 mg).  Chocolate, 1 oz bar (165 mg).  Coffee, brewed 6 oz (90 mg).  Corn,  cup or 1 ear (195 mg).  Cucumbers,  cup (80 mg).  Egg, large, 1 each (60 mg).  Eggplant,  cup (60 mg).  Endive, raw, cup (80 mg).  English muffin, 1 each (65 mg).  Fish, orange roughy, 3 oz (150 mg).  Frankfurter, beef or pork, 1 each (75 mg).  Fruit cocktail,  cup (115 mg).  Grape juice,  cup (170  mg).  Grapefruit,  fruit (175 mg).  Grapes,  cup (155 mg).  Greens: kale, turnip, collard,  cup (110-150 mg).  Ice cream or frozen yogurt, chocolate,  cup (175 mg).  Ice cream or frozen yogurt, vanilla,  cup (120-150 mg).  Lemons, limes, 1 each (80 mg).  Lettuce, all types, 1 cup (100 mg).  Mixed vegetables,  cup (150 mg).  Mushrooms, raw,  cup (110 mg).  Nuts: walnuts, pecans, or macadamia, 1 oz (125 mg).  Oatmeal,  cup (80 mg).  Okra,  cup (110 mg).  Onions, raw,  cup (  120 mg).  Peach, 1 each (185 mg).  Peaches, canned,  cup (120 mg).  Pears, canned,  cup (120 mg).  Peas, green, frozen,  cup (90 mg).  Peppers, green,  cup (130 mg).  Peppers, red,  cup (160 mg).  Pineapple juice,  cup (165 mg).  Pineapple, fresh or canned,  cup (100 mg).  Plums, 1 each (105 mg).  Pudding, vanilla,  cup (150 mg).  Raspberries,  cup (90 mg).  Rhubarb,  cup (115 mg).  Rice, wild,  cup (80 mg).  Shrimp, 3 oz (155 mg).  Spinach, raw, 1 cup (170 mg).  Strawberries,  cup (125 mg).  Summer squash  cup (175-200 mg).  Swiss chard, raw, 1 cup (135 mg).  Tangerines, 1 each (140 mg).  Tea, brewed, 6 oz (65 mg).  Turnips,  cup (140 mg).  Watermelon,  cup (85 mg).  Wine, red, table, 5 oz (180 mg).  Wine, white, table, 5 oz (100 mg). LOW IN POTASSIUM The following foods and beverages have less than 50 mg of potassium per serving.  Bread, white, 1 slice (30 mg).  Carbonated beverages, 12 oz (less than 5 mg).  Cheese, 1 oz (20-30 mg).  Cranberries,  cup (45 mg).  Cranberry juice cocktail,  cup (20 mg).  Fats and oils, 1 Tbsp (less than 5 mg).  Hummus, 1 Tbsp (32 mg).  Nectar: papaya, mango, or pear,  cup (35 mg).  Rice, white or brown,  cup (50 mg).  Spaghetti or macaroni,  cup cooked (30 mg).  Tortilla, flour or corn, 1 each (50 mg).  Waffle, 4 in., 1 each (50 mg).  Water chestnuts,  cup (40 mg). Document Released:  04/01/2005 Document Revised: 08/23/2013 Document Reviewed: 07/15/2013 [facility] Patient Information 2015 [place], [place]. This information is not intended to replace advice given to you by your health care provider. Make sure you discuss any questions you have with your health care provider.

## 2014-10-12 DIAGNOSIS — E119 Type 2 diabetes mellitus without complications: Secondary | ICD-10-CM | POA: Diagnosis not present

## 2014-10-12 DIAGNOSIS — L03032 Cellulitis of left toe: Secondary | ICD-10-CM | POA: Diagnosis not present

## 2014-10-17 ENCOUNTER — Ambulatory Visit: Payer: Medicare Other | Admitting: Pharmacist

## 2014-10-17 ENCOUNTER — Ambulatory Visit (HOSPITAL_BASED_OUTPATIENT_CLINIC_OR_DEPARTMENT_OTHER): Payer: Medicare Other

## 2014-10-17 ENCOUNTER — Ambulatory Visit: Payer: Medicare Other

## 2014-10-17 ENCOUNTER — Other Ambulatory Visit (HOSPITAL_BASED_OUTPATIENT_CLINIC_OR_DEPARTMENT_OTHER): Payer: Medicare Other

## 2014-10-17 ENCOUNTER — Other Ambulatory Visit: Payer: Medicare Other

## 2014-10-17 DIAGNOSIS — I82401 Acute embolism and thrombosis of unspecified deep veins of right lower extremity: Secondary | ICD-10-CM

## 2014-10-17 DIAGNOSIS — Z5112 Encounter for antineoplastic immunotherapy: Secondary | ICD-10-CM | POA: Diagnosis not present

## 2014-10-17 DIAGNOSIS — Z5111 Encounter for antineoplastic chemotherapy: Secondary | ICD-10-CM | POA: Diagnosis not present

## 2014-10-17 DIAGNOSIS — C9000 Multiple myeloma not having achieved remission: Secondary | ICD-10-CM

## 2014-10-17 DIAGNOSIS — Z7901 Long term (current) use of anticoagulants: Secondary | ICD-10-CM

## 2014-10-17 LAB — CBC WITH DIFFERENTIAL/PLATELET
BASO%: 0.2 % (ref 0.0–2.0)
Basophils Absolute: 0 10*3/uL (ref 0.0–0.1)
EOS%: 0.9 % (ref 0.0–7.0)
Eosinophils Absolute: 0 10*3/uL (ref 0.0–0.5)
HCT: 33.9 % — ABNORMAL LOW (ref 34.8–46.6)
HGB: 10.4 g/dL — ABNORMAL LOW (ref 11.6–15.9)
LYMPH%: 5.4 % — ABNORMAL LOW (ref 14.0–49.7)
MCH: 29.7 pg (ref 25.1–34.0)
MCHC: 30.6 g/dL — ABNORMAL LOW (ref 31.5–36.0)
MCV: 97.1 fL (ref 79.5–101.0)
MONO#: 0.4 10*3/uL (ref 0.1–0.9)
MONO%: 9.3 % (ref 0.0–14.0)
NEUT#: 3.5 10*3/uL (ref 1.5–6.5)
NEUT%: 84.2 % — ABNORMAL HIGH (ref 38.4–76.8)
Platelets: 187 10*3/uL (ref 145–400)
RBC: 3.49 10*6/uL — ABNORMAL LOW (ref 3.70–5.45)
RDW: 18.5 % — ABNORMAL HIGH (ref 11.2–14.5)
WBC: 4.2 10*3/uL (ref 3.9–10.3)
lymph#: 0.2 10*3/uL — ABNORMAL LOW (ref 0.9–3.3)

## 2014-10-17 LAB — COMPREHENSIVE METABOLIC PANEL (CC13)
ALT: 11 U/L (ref 0–55)
AST: 12 U/L (ref 5–34)
Albumin: 3.2 g/dL — ABNORMAL LOW (ref 3.5–5.0)
Alkaline Phosphatase: 59 U/L (ref 40–150)
Anion Gap: 12 mEq/L — ABNORMAL HIGH (ref 3–11)
BUN: 15.7 mg/dL (ref 7.0–26.0)
CALCIUM: 9.8 mg/dL (ref 8.4–10.4)
CHLORIDE: 107 meq/L (ref 98–109)
CO2: 26 meq/L (ref 22–29)
CREATININE: 0.9 mg/dL (ref 0.6–1.1)
EGFR: 76 mL/min/{1.73_m2} — AB (ref >90)
GLUCOSE: 185 mg/dL — AB (ref 70–140)
Potassium: 3.4 mEq/L — ABNORMAL LOW (ref 3.5–5.1)
Sodium: 144 mEq/L (ref 136–145)
Total Bilirubin: 0.41 mg/dL (ref 0.20–1.20)
Total Protein: 5.5 g/dL — ABNORMAL LOW (ref 6.4–8.3)

## 2014-10-17 LAB — PROTIME-INR
INR: 1.7 — ABNORMAL LOW (ref 2.00–3.50)
Protime: 20.4 s — ABNORMAL HIGH (ref 10.6–13.4)

## 2014-10-17 LAB — POCT INR: INR: 1.7

## 2014-10-17 MED ORDER — ONDANSETRON 8 MG/NS 50 ML IVPB
INTRAVENOUS | Status: AC
  Filled 2014-10-17: qty 8

## 2014-10-17 MED ORDER — DEXAMETHASONE SODIUM PHOSPHATE 10 MG/ML IJ SOLN
10.0000 mg | Freq: Once | INTRAMUSCULAR | Status: AC
  Administered 2014-10-17: 10 mg via INTRAVENOUS

## 2014-10-17 MED ORDER — DEXTROSE 5 % IV SOLN
36.0000 mg/m2 | Freq: Once | INTRAVENOUS | Status: AC
  Administered 2014-10-17: 54 mg via INTRAVENOUS
  Filled 2014-10-17: qty 27

## 2014-10-17 MED ORDER — SODIUM CHLORIDE 0.9 % IV SOLN
300.0000 mg/m2 | Freq: Once | INTRAVENOUS | Status: AC
  Administered 2014-10-17: 440 mg via INTRAVENOUS
  Filled 2014-10-17: qty 22

## 2014-10-17 MED ORDER — ONDANSETRON 8 MG/50ML IVPB (CHCC)
8.0000 mg | Freq: Once | INTRAVENOUS | Status: AC
  Administered 2014-10-17: 8 mg via INTRAVENOUS

## 2014-10-17 MED ORDER — SODIUM CHLORIDE 0.9 % IV SOLN
Freq: Once | INTRAVENOUS | Status: AC
  Administered 2014-10-17: 13:00:00 via INTRAVENOUS

## 2014-10-17 MED ORDER — DEXAMETHASONE SODIUM PHOSPHATE 10 MG/ML IJ SOLN
INTRAMUSCULAR | Status: AC
  Filled 2014-10-17: qty 1

## 2014-10-17 NOTE — Patient Instructions (Signed)
[facility] Discharge Instructions for Patients Receiving Chemotherapy  Today you received the following chemotherapy agents: Cytoxan and Kyprolis.   To help prevent nausea and vomiting after your treatment, we encourage you to take your nausea medication: Compazine (Prochlorperazine). Take one every 6 hours as needed.    If you develop nausea and vomiting that is not controlled by your nausea medication, call the clinic.   BELOW ARE SYMPTOMS THAT SHOULD BE REPORTED IMMEDIATELY:  *FEVER GREATER THAN 100.5 F  *CHILLS WITH OR WITHOUT FEVER  NAUSEA AND VOMITING THAT IS NOT CONTROLLED WITH YOUR NAUSEA MEDICATION  *UNUSUAL SHORTNESS OF BREATH  *UNUSUAL BRUISING OR BLEEDING  TENDERNESS IN MOUTH AND THROAT WITH OR WITHOUT PRESENCE OF ULCERS  *URINARY PROBLEMS  *BOWEL PROBLEMS  UNUSUAL RASH Items with * indicate a potential emergency and should be followed up as soon as possible.  Feel free to call the clinic should you have any questions or concerns. The clinic phone number is [phone].

## 2014-10-17 NOTE — Progress Notes (Signed)
Pt seen during her infusion today INR=1.7 No changes to report Will give an extra 2.5 mg today Then continue to take 2.5 mg daily except 5 mg on Tuesdays and Thursdays.  Recheck PT/INR on 10/31/14; Lab at 9:15, MD at 9:45, treatment at 10:45 and 11:00 coumadin clinic will see you in the infusion area

## 2014-10-17 NOTE — Patient Instructions (Signed)
Take and extra 2.5 mg today for a total dose of 7.5 mg then continue to take 2.5 mg daily except 5 mg on Tuesdays and Thursdays.  Recheck PT/INR on 10/31/14; Lab at 9:15, MD at 9:45, treatment at 10:45 and 11:00 coumadin clinic will see you in the infusion area

## 2014-10-18 ENCOUNTER — Ambulatory Visit (HOSPITAL_BASED_OUTPATIENT_CLINIC_OR_DEPARTMENT_OTHER): Payer: Medicare Other

## 2014-10-18 DIAGNOSIS — Z5112 Encounter for antineoplastic immunotherapy: Secondary | ICD-10-CM | POA: Diagnosis not present

## 2014-10-18 DIAGNOSIS — C9000 Multiple myeloma not having achieved remission: Secondary | ICD-10-CM | POA: Diagnosis not present

## 2014-10-18 MED ORDER — ONDANSETRON 8 MG/NS 50 ML IVPB
INTRAVENOUS | Status: AC
  Filled 2014-10-18: qty 8

## 2014-10-18 MED ORDER — DEXAMETHASONE SODIUM PHOSPHATE 10 MG/ML IJ SOLN
10.0000 mg | Freq: Once | INTRAMUSCULAR | Status: AC
  Administered 2014-10-18: 10 mg via INTRAVENOUS

## 2014-10-18 MED ORDER — SODIUM CHLORIDE 0.9 % IV SOLN
Freq: Once | INTRAVENOUS | Status: AC
  Administered 2014-10-18: 12:00:00 via INTRAVENOUS

## 2014-10-18 MED ORDER — CARFILZOMIB CHEMO INJECTION 60 MG
36.0000 mg/m2 | Freq: Once | INTRAVENOUS | Status: AC
  Administered 2014-10-18: 54 mg via INTRAVENOUS
  Filled 2014-10-18: qty 27

## 2014-10-18 MED ORDER — DEXAMETHASONE SODIUM PHOSPHATE 10 MG/ML IJ SOLN
INTRAMUSCULAR | Status: AC
  Filled 2014-10-18: qty 1

## 2014-10-18 MED ORDER — ONDANSETRON 8 MG/50ML IVPB (CHCC)
8.0000 mg | Freq: Once | INTRAVENOUS | Status: AC
  Administered 2014-10-18: 8 mg via INTRAVENOUS

## 2014-10-18 NOTE — Patient Instructions (Signed)
[facility] Discharge Instructions for Patients Receiving Chemotherapy  Today you received the following chemotherapy agents kyprolis  To help prevent nausea and vomiting after your treatment, we encourage you to take your nausea medication as directed   If you develop nausea and vomiting that is not controlled by your nausea medication, call the clinic.   BELOW ARE SYMPTOMS THAT SHOULD BE REPORTED IMMEDIATELY:  *FEVER GREATER THAN 100.5 F  *CHILLS WITH OR WITHOUT FEVER  NAUSEA AND VOMITING THAT IS NOT CONTROLLED WITH YOUR NAUSEA MEDICATION  *UNUSUAL SHORTNESS OF BREATH  *UNUSUAL BRUISING OR BLEEDING  TENDERNESS IN MOUTH AND THROAT WITH OR WITHOUT PRESENCE OF ULCERS  *URINARY PROBLEMS  *BOWEL PROBLEMS  UNUSUAL RASH Items with * indicate a potential emergency and should be followed up as soon as possible.  Feel free to call the clinic you have any questions or concerns. The clinic phone number is [phone].

## 2014-10-19 ENCOUNTER — Telehealth: Payer: Self-pay

## 2014-10-19 ENCOUNTER — Other Ambulatory Visit: Payer: Self-pay | Admitting: *Deleted

## 2014-10-19 DIAGNOSIS — C9000 Multiple myeloma not having achieved remission: Secondary | ICD-10-CM

## 2014-10-19 DIAGNOSIS — I82402 Acute embolism and thrombosis of unspecified deep veins of left lower extremity: Secondary | ICD-10-CM

## 2014-10-19 MED ORDER — OXYCODONE HCL 5 MG PO CAPS: ORAL_CAPSULE | ORAL | Status: DC

## 2014-10-19 MED ORDER — OXYCODONE HCL 5 MG PO TABS: 7.5000 mg | ORAL_TABLET | Freq: Four times a day (QID) | ORAL | Status: DC | PRN

## 2014-10-19 NOTE — Telephone Encounter (Signed)
Returning call from 321pm. [name] called to have oxycodone refilled. rx prepared for signature

## 2014-10-24 ENCOUNTER — Ambulatory Visit (INDEPENDENT_AMBULATORY_CARE_PROVIDER_SITE_OTHER)
Admission: RE | Admit: 2014-10-24 | Discharge: 2014-10-24 | Disposition: A | Discharge status: Home / Self Care | Payer: Medicare Other | Source: Ambulatory Visit | Attending: Family Medicine | Admitting: Family Medicine

## 2014-10-24 ENCOUNTER — Ambulatory Visit (INDEPENDENT_AMBULATORY_CARE_PROVIDER_SITE_OTHER): Payer: Medicare Other | Admitting: Family Medicine

## 2014-10-24 ENCOUNTER — Encounter: Payer: Self-pay | Admitting: Family Medicine

## 2014-10-24 ENCOUNTER — Ambulatory Visit
Admission: RE | Admit: 2014-10-24 | Discharge: 2014-10-24 | Disposition: A | Discharge status: Home / Self Care | Payer: Medicare Other | Source: Ambulatory Visit | Attending: Family Medicine | Admitting: Family Medicine

## 2014-10-24 VITALS — BP 122/70 | HR 71 | Temp 97.6°F | Wt 113.8 lb

## 2014-10-24 DIAGNOSIS — M25552 Pain in left hip: Secondary | ICD-10-CM

## 2014-10-24 DIAGNOSIS — S79912A Unspecified injury of left hip, initial encounter: Secondary | ICD-10-CM | POA: Diagnosis not present

## 2014-10-24 DIAGNOSIS — S99912A Unspecified injury of left ankle, initial encounter: Secondary | ICD-10-CM

## 2014-10-24 NOTE — Progress Notes (Signed)
Pre visit review using our clinic review tool, if applicable. No additional management support is needed unless otherwise documented below in the visit note. 

## 2014-10-24 NOTE — Assessment & Plan Note (Signed)
New- did not actually fall but she is tender to palpation over hip and pelvis. Given her h/o MM undergoing tx, I do feel xray is warranted. Pt and her daughter agree with plan. Also discussed getting a bone density scan with pt- she will talk with her oncologist.

## 2014-10-24 NOTE — Progress Notes (Signed)
Subjective:   Patient ID: [name], female    DOB: 1936/01/25, 79 y.o.   MRN: [id number]  [name] is a pleasant 79 y.o. year old female pt of Dr. [name], with h/o MM, who presents to clinic today with Leg Pain  on 10/24/2014  HPI:  4 days ago, was at home reaching for something in top of her closet and felt sharp pain her her left hip/leg.  Since then, pain has persisted with standing, walking and sitting.  Walks with a walker at home.  Followed by Dr. Earlie Server- currently undergoing chemotherapy- new rx since 09/2014.  Lab Results  Component Value Date   WBC 4.2 10/17/2014   HGB 10.4* 10/17/2014   HCT 33.9* 10/17/2014   MCV 97.1 10/17/2014   PLT 187 10/17/2014    Denies numbness down her left leg.    Current Outpatient Prescriptions on File Prior to Visit  Medication Sig Dispense Refill  . acyclovir (ZOVIRAX) 400 MG tablet     . ALPRAZolam (XANAX) 0.5 MG tablet Take 1 tablet (0.5 mg total) by mouth 2 (two) times daily as needed for anxiety. 60 tablet 0  . Alum & Mag Hydroxide-Simeth (MAGIC MOUTHWASH W/LIDOCAINE) SOLN Take 5 mLs by mouth 3 (three) times daily as needed for mouth pain (do not swallow, swish and spit 7ml TID PRN). 120 mL 0  . Calcium Carbonate-Vitamin D 600-400 MG-UNIT per tablet Take 1 tablet by mouth 3 (three) times daily with meals.      Marland Kitchen dexamethasone (DECADRON) 4 MG tablet Take 10 tab (40 mg) weekly except on days when you receive elotuzumab. On those days take 7 tab (28 mg) between 3 & 24 hours prior to the start of elotuzumab.    . diphenhydrAMINE (SOMINEX) 25 MG tablet Take 25 mg by mouth at bedtime as needed for sleep.    Marland Kitchen glipiZIDE (GLUCOTROL XL) 2.5 MG 24 hr tablet Take 2.5 mg by mouth daily with breakfast.    . ibuprofen (ADVIL) 200 MG tablet Take 400 mg by mouth every 6 (six) hours as needed for headache or moderate pain.    Marland Kitchen lisinopril-hydrochlorothiazide (PRINZIDE,ZESTORETIC) 20-12.5 MG per tablet TAKE ONE-HALF TABLET BY MOUTH ONCE DAILY 45  tablet 1  . loperamide (IMODIUM A-D) 2 MG tablet Take 1 tablet (2 mg total) by mouth 4 (four) times daily as needed for diarrhea or loose stools.    Marland Kitchen oxyCODONE (OXY IR/ROXICODONE) 5 MG immediate release tablet Take 1.5 tablets (7.5 mg total) by mouth every 6 (six) hours as needed for severe pain. 90 tablet 0  . potassium chloride SA (K-DUR,KLOR-CON) 20 MEQ tablet Take 1 tablet (20 mEq total) by mouth as directed. 58meq x 7 days (Patient taking differently: Take 20 mEq by mouth daily. 57meq x 7 days) 7 tablet 0  . prochlorperazine (COMPAZINE) 10 MG tablet TAKE ONE TABLET BY MOUTH EVERY 6 HOURS AS NEEDED 30 tablet 0  . temazepam (RESTORIL) 30 MG capsule Take 1 capsule (30 mg total) by mouth at bedtime as needed for sleep. 3 capsule 0  . warfarin (COUMADIN) 5 MG tablet TAKE ONE TABLET BY MOUTH AS DIRECTED 60 tablet 0   No current facility-administered medications on file prior to visit.    No Known Allergies  Past Medical History  Diagnosis Date  . Blood transfusion   . Depression     Mild  . History of chicken pox   . Hyperlipidemia   . Hypertension   . Degenerative joint disease   .  Phlebitis   . Multiple myeloma     per Dr. Julien Nordmann, s/p palliative radiation for leg pain 2011 per Dr. Sondra Come  . Deep vein thrombosis of bilateral lower extremities   . Family history of anesthesia complication     DAUGHTER CPR AFTER  . Cardiomyopathy   . Diabetes mellitus     Steroid related  . UTI (urinary tract infection) 09/08/2013  . History of radiation therapy 08/30/13-09/20/13    20 gray to lower lumbar/upper sacrum    Past Surgical History  Procedure Laterality Date  . Abdominal hysterectomy    . Ankle fracture surgery Left     Family History  Problem Relation Age of Onset  . Diabetes Mother   . Hypertension Mother   . Arthritis Mother   . Stroke Mother   . Diabetes Father   . Hypertension Father   . Arthritis Father   . Stroke Father   . Cancer Sister     stomach  . Stroke  Brother   . Cancer Brother     prostate <50  . Stroke Daughter   . Stroke Son   . Arthritis Other     Parents  . Cancer Other     Colon, 1st degree relative <60  . Diabetes Other     Parents  . Stroke Other     1st degree relative <60    History   Social History  . Marital Status: Widowed    Spouse Name: N/A  . Number of Children: N/A  . Years of Education: N/A   Occupational History  . Retired    Social History Main Topics  . Smoking status: Former Smoker -- 1.00 packs/day for 13 years    Quit date: 09/01/1973  . Smokeless tobacco: Never Used     Comment: QIUT MANY YEARS AGO  . Alcohol Use: No  . Drug Use: No  . Sexual Activity: No   Other Topics Concern  . Not on file   Social History Narrative   Regular exercise:  Yes   The PMH, PSH, Social History, Family History, Medications, and allergies have been reviewed in [facility], and have been updated if relevant.  Review of Systems  Musculoskeletal: Positive for arthralgias.  All other systems reviewed and are negative.      Objective:    BP 122/70 mmHg  Pulse 71  Temp(Src) 97.6 F (36.4 C) (Oral)  Wt 113 lb 12 oz (51.597 kg)  SpO2 96%   Physical Exam  Constitutional: She is oriented to person, place, and time. She appears well-developed and well-nourished. No distress.  Cardiovascular: Normal rate.   Pulmonary/Chest: Effort normal.  Musculoskeletal:       Left hip: She exhibits decreased range of motion, decreased strength, tenderness and bony tenderness. She exhibits no swelling, no crepitus, no deformity and no laceration.  Neurological: She is alert and oriented to person, place, and time.  Skin: Skin is warm and dry.  Psychiatric: She has a normal mood and affect. Her behavior is normal. Judgment and thought content normal.  Nursing note and vitals reviewed.         Assessment & Plan:   Left hip pain - Plan: DG HIP UNILAT WITH PELVIS 2-3 VIEWS LEFT  Left ankle injury, initial encounter -  Plan: CANCELED: DG Ankle Complete Left No Follow-up on file.

## 2014-10-25 ENCOUNTER — Telehealth: Payer: Self-pay | Admitting: Family Medicine

## 2014-10-25 NOTE — Telephone Encounter (Signed)
XRay not resulted as of yet. Pt will be contacted upon their resulting

## 2014-10-25 NOTE — Telephone Encounter (Signed)
[name] to get results of xray that was done 2/23

## 2014-10-31 ENCOUNTER — Other Ambulatory Visit (HOSPITAL_BASED_OUTPATIENT_CLINIC_OR_DEPARTMENT_OTHER): Payer: Medicare Other

## 2014-10-31 ENCOUNTER — Encounter: Payer: Self-pay | Admitting: Internal Medicine

## 2014-10-31 ENCOUNTER — Telehealth: Payer: Self-pay | Admitting: Internal Medicine

## 2014-10-31 ENCOUNTER — Ambulatory Visit (HOSPITAL_BASED_OUTPATIENT_CLINIC_OR_DEPARTMENT_OTHER): Payer: Medicare Other

## 2014-10-31 ENCOUNTER — Ambulatory Visit (HOSPITAL_BASED_OUTPATIENT_CLINIC_OR_DEPARTMENT_OTHER): Payer: Medicare Other | Admitting: Internal Medicine

## 2014-10-31 ENCOUNTER — Ambulatory Visit: Payer: Medicare Other

## 2014-10-31 ENCOUNTER — Ambulatory Visit (HOSPITAL_BASED_OUTPATIENT_CLINIC_OR_DEPARTMENT_OTHER): Payer: Medicare Other | Admitting: Pharmacist

## 2014-10-31 ENCOUNTER — Telehealth: Payer: Self-pay | Admitting: *Deleted

## 2014-10-31 VITALS — BP 156/59 | HR 71 | Temp 98.2°F | Resp 18 | Ht <= 58 in | Wt 117.0 lb

## 2014-10-31 DIAGNOSIS — Z5111 Encounter for antineoplastic chemotherapy: Secondary | ICD-10-CM

## 2014-10-31 DIAGNOSIS — Z7901 Long term (current) use of anticoagulants: Secondary | ICD-10-CM

## 2014-10-31 DIAGNOSIS — I82402 Acute embolism and thrombosis of unspecified deep veins of left lower extremity: Secondary | ICD-10-CM | POA: Diagnosis not present

## 2014-10-31 DIAGNOSIS — C9000 Multiple myeloma not having achieved remission: Secondary | ICD-10-CM

## 2014-10-31 DIAGNOSIS — I82401 Acute embolism and thrombosis of unspecified deep veins of right lower extremity: Secondary | ICD-10-CM

## 2014-10-31 DIAGNOSIS — Z5112 Encounter for antineoplastic immunotherapy: Secondary | ICD-10-CM

## 2014-10-31 LAB — COMPREHENSIVE METABOLIC PANEL (CC13)
ALK PHOS: 60 U/L (ref 40–150)
ALT: 14 U/L (ref 0–55)
AST: 15 U/L (ref 5–34)
Albumin: 3.1 g/dL — ABNORMAL LOW (ref 3.5–5.0)
Anion Gap: 12 mEq/L — ABNORMAL HIGH (ref 3–11)
BUN: 12.3 mg/dL (ref 7.0–26.0)
CO2: 26 mEq/L (ref 22–29)
Calcium: 9.1 mg/dL (ref 8.4–10.4)
Chloride: 107 mEq/L (ref 98–109)
Creatinine: 0.9 mg/dL (ref 0.6–1.1)
EGFR: 73 mL/min/{1.73_m2} — ABNORMAL LOW (ref >90)
Glucose: 155 mg/dl — ABNORMAL HIGH (ref 70–140)
Potassium: 3.3 mEq/L — ABNORMAL LOW (ref 3.5–5.1)
Sodium: 144 mEq/L (ref 136–145)
Total Bilirubin: 0.36 mg/dL (ref 0.20–1.20)
Total Protein: 5.4 g/dL — ABNORMAL LOW (ref 6.4–8.3)

## 2014-10-31 LAB — CBC WITH DIFFERENTIAL/PLATELET
BASO%: 0.2 % (ref 0.0–2.0)
BASOS ABS: 0 10*3/uL (ref 0.0–0.1)
EOS%: 1.6 % (ref 0.0–7.0)
Eosinophils Absolute: 0.1 10*3/uL (ref 0.0–0.5)
HEMATOCRIT: 34.6 % — AB (ref 34.8–46.6)
HEMOGLOBIN: 10.8 g/dL — AB (ref 11.6–15.9)
LYMPH#: 0.5 10*3/uL — AB (ref 0.9–3.3)
LYMPH%: 11 % — ABNORMAL LOW (ref 14.0–49.7)
MCH: 30.6 pg (ref 25.1–34.0)
MCHC: 31.2 g/dL — ABNORMAL LOW (ref 31.5–36.0)
MCV: 98 fL (ref 79.5–101.0)
MONO#: 0.5 10*3/uL (ref 0.1–0.9)
MONO%: 11.2 % (ref 0.0–14.0)
NEUT#: 3.3 10*3/uL (ref 1.5–6.5)
NEUT%: 76 % (ref 38.4–76.8)
Platelets: 253 10*3/uL (ref 145–400)
RBC: 3.53 10*6/uL — ABNORMAL LOW (ref 3.70–5.45)
RDW: 16.6 % — ABNORMAL HIGH (ref 11.2–14.5)
WBC: 4.4 10*3/uL (ref 3.9–10.3)
nRBC: 0 % (ref 0–0)

## 2014-10-31 LAB — PROTIME-INR
INR: 1.5 — ABNORMAL LOW (ref 2.00–3.50)
PROTIME: 18 s — AB (ref 10.6–13.4)

## 2014-10-31 LAB — POCT INR: INR: 1.5

## 2014-10-31 MED ORDER — ONDANSETRON 8 MG/50ML IVPB (CHCC)
8.0000 mg | Freq: Once | INTRAVENOUS | Status: AC
  Administered 2014-10-31: 8 mg via INTRAVENOUS

## 2014-10-31 MED ORDER — DEXAMETHASONE SODIUM PHOSPHATE 10 MG/ML IJ SOLN
INTRAMUSCULAR | Status: AC
  Filled 2014-10-31: qty 1

## 2014-10-31 MED ORDER — DEXAMETHASONE SODIUM PHOSPHATE 10 MG/ML IJ SOLN
10.0000 mg | Freq: Once | INTRAMUSCULAR | Status: AC
  Administered 2014-10-31: 10 mg via INTRAVENOUS

## 2014-10-31 MED ORDER — SODIUM CHLORIDE 0.9 % IV SOLN
Freq: Once | INTRAVENOUS | Status: AC
  Administered 2014-10-31: 11:00:00 via INTRAVENOUS

## 2014-10-31 MED ORDER — ONDANSETRON 8 MG/NS 50 ML IVPB
INTRAVENOUS | Status: AC
  Filled 2014-10-31: qty 8

## 2014-10-31 MED ORDER — DEXTROSE 5 % IV SOLN
36.0000 mg/m2 | Freq: Once | INTRAVENOUS | Status: AC
  Administered 2014-10-31: 54 mg via INTRAVENOUS
  Filled 2014-10-31: qty 27

## 2014-10-31 MED ORDER — SODIUM CHLORIDE 0.9 % IV SOLN
300.0000 mg/m2 | Freq: Once | INTRAVENOUS | Status: AC
  Administered 2014-10-31: 440 mg via INTRAVENOUS
  Filled 2014-10-31: qty 22

## 2014-10-31 NOTE — Progress Notes (Signed)
INR below goal again today at 1.5 Ms. Kapler seen in infusion area Spoke with patient and her daughter Pt is doing well with no complaints No missed or extra doses No diet or medication changes No unusual bleeding or bruising Pt took coumadin exactly as prescribed last week Plan: Increase dose to 5 mg on Tues/Thurs/Sat and take 2.5 mg all other days.  Recheck PT/INR on 11/07/14; Lab at 11:15, treatment at 12:15 and coumadin clinic will see you in the infusion area.

## 2014-10-31 NOTE — Progress Notes (Signed)
[place] [phone]   [phone]  OFFICE PROGRESS NOTE  [place], [place] [id number]  DIAGNOSIS:  1. Multiple myeloma diagnosed in September 2009,  2. history of bilateral lower extremity deep vein thrombosis diagnosed in November 2009  3. acute on chronic deep vein thrombosis in the left lower extremity diagnosed in October 2010.   PRIOR THERAPY:  1. status post palliative radiotherapy to the right femur and ischail tuberosity, first was done in December 2011 and the second treatment was completed Jan 08 2011, and the third treatment to the right distal femur completed on 04/30/2011  2. status post palliative radiotherapy to the left proximal femur completed 02/28/2011.  3. Revlimid 25 mg by mouth daily for 21 days every 4 weeks in addition to oral Decadron at 40 mg by mouth on a weekly basis status post 40 cycles.  4. weekly subcutaneous Velcade 1.3 mg/M2 status post 60 cycles, discontinued today secondary to mild disease progression. 5. Pomalyst 4 mg by mouth daily for 21 days every 4 weeks, the patient started the first dose on 12/22/2012 , in addition to Decadron 40 mg weekly. Status post 13 cycles.  6. Chemotherapy according to the [facility] [id number] expanded access treatment protocol with Elotuzumab, lenalidomide and dexamthasone. Status post 5 cycles, discontinued today secondary to disease progression.   CURRENT THERAPY:  1. Systemic chemotherapy with Carfilzomib, Cytoxan and Decadron. First cycle 09/05/2014. 2. Coumadin followed by the Coumadin clinic at the [facility].  3. Zometa 4 mg IV given every 3 months.  INTERVAL HISTORY: [name] 79 y.o. female returns to the clinic today for follow-up visit accompanied by her daughter. The patient is tolerating her current treatment with Carfilzomib, Cytoxan and Decadron fairly well with no significant adverse effect fairly well. Her daughter  noticed that the patient has been doing much better since she started this treatment. She denied having any significant weight loss or night sweats. She has no nausea or vomiting. The patient denied having any significant chest pain, shortness of breath, cough or hemoptysis. She is here today to start cycle #3.  MEDICAL HISTORY: Past Medical History  Diagnosis Date  . Blood transfusion   . Depression     Mild  . History of chicken pox   . Hyperlipidemia   . Hypertension   . Degenerative joint disease   . Phlebitis   . Multiple myeloma     per Dr. Julien Nordmann, s/p palliative radiation for leg pain 2011 per Dr. Sondra Come  . Deep vein thrombosis of bilateral lower extremities   . Family history of anesthesia complication     DAUGHTER CPR AFTER  . Cardiomyopathy   . Diabetes mellitus     Steroid related  . UTI (urinary tract infection) 09/08/2013  . History of radiation therapy 08/30/13-09/20/13    20 gray to lower lumbar/upper sacrum    ALLERGIES:  has No Known Allergies.  MEDICATIONS:  Current Outpatient Prescriptions  Medication Sig Dispense Refill  . acyclovir (ZOVIRAX) 400 MG tablet     . ALPRAZolam (XANAX) 0.5 MG tablet Take 1 tablet (0.5 mg total) by mouth 2 (two) times daily as needed for anxiety. 60 tablet 0  . Alum & Mag Hydroxide-Simeth (MAGIC MOUTHWASH W/LIDOCAINE) SOLN Take 5 mLs by mouth 3 (three) times daily as needed for mouth pain (do not swallow, swish and spit 21ml TID PRN). 120 mL 0  . Calcium Carbonate-Vitamin D 600-400 MG-UNIT  per tablet Take 1 tablet by mouth 3 (three) times daily with meals.      Marland Kitchen dexamethasone (DECADRON) 4 MG tablet Take 10 tab (40 mg) weekly except on days when you receive elotuzumab. On those days take 7 tab (28 mg) between 3 & 24 hours prior to the start of elotuzumab.    . diphenhydrAMINE (SOMINEX) 25 MG tablet Take 25 mg by mouth at bedtime as needed for sleep.    Marland Kitchen glipiZIDE (GLUCOTROL XL) 2.5 MG 24 hr tablet Take 2.5 mg by mouth daily with  breakfast.    . ibuprofen (ADVIL) 200 MG tablet Take 400 mg by mouth every 6 (six) hours as needed for headache or moderate pain.    Marland Kitchen lisinopril-hydrochlorothiazide (PRINZIDE,ZESTORETIC) 20-12.5 MG per tablet TAKE ONE-HALF TABLET BY MOUTH ONCE DAILY 45 tablet 1  . loperamide (IMODIUM A-D) 2 MG tablet Take 1 tablet (2 mg total) by mouth 4 (four) times daily as needed for diarrhea or loose stools.    Marland Kitchen oxyCODONE (OXY IR/ROXICODONE) 5 MG immediate release tablet Take 1.5 tablets (7.5 mg total) by mouth every 6 (six) hours as needed for severe pain. 90 tablet 0  . potassium chloride SA (K-DUR,KLOR-CON) 20 MEQ tablet Take 1 tablet (20 mEq total) by mouth as directed. 50meq x 7 days (Patient taking differently: Take 20 mEq by mouth daily. 65meq x 7 days) 7 tablet 0  . prochlorperazine (COMPAZINE) 10 MG tablet TAKE ONE TABLET BY MOUTH EVERY 6 HOURS AS NEEDED 30 tablet 0  . temazepam (RESTORIL) 30 MG capsule Take 1 capsule (30 mg total) by mouth at bedtime as needed for sleep. 3 capsule 0  . warfarin (COUMADIN) 5 MG tablet TAKE ONE TABLET BY MOUTH AS DIRECTED 60 tablet 0   No current facility-administered medications for this visit.    SURGICAL HISTORY:  Past Surgical History  Procedure Laterality Date  . Abdominal hysterectomy    . Ankle fracture surgery Left     REVIEW OF SYSTEMS:  Constitutional: positive for fatigue Eyes: negative Ears, nose, mouth, throat, and face: negative Respiratory: negative Cardiovascular: negative Gastrointestinal: negative Genitourinary:negative Integument/breast: negative Hematologic/lymphatic: negative Musculoskeletal:positive for bone pain Neurological: negative Behavioral/Psych: negative Endocrine: negative Allergic/Immunologic: negative   PHYSICAL EXAMINATION: General appearance: alert, cooperative and no distress Head: Normocephalic, without obvious abnormality, atraumatic Neck: no adenopathy, no JVD, supple, symmetrical, trachea midline and thyroid  not enlarged, symmetric, no tenderness/mass/nodules Lymph nodes: Cervical, supraclavicular, and axillary nodes normal. Resp: clear to auscultation bilaterally Back: symmetric, no curvature. ROM normal. No CVA tenderness. Cardio: regular rate and rhythm, S1, S2 normal, no murmur, click, rub or gallop GI: soft, non-tender; bowel sounds normal; no masses,  no organomegaly Extremities: extremities normal, atraumatic, no cyanosis or edema Neurologic: Alert and oriented X 3, normal strength and tone. Normal symmetric reflexes. Normal coordination and gait  ECOG PERFORMANCE STATUS: 1 - Symptomatic but completely ambulatory  Blood pressure 156/59, pulse 71, temperature 98.2 F (36.8 C), temperature source Oral, resp. rate 18, height $RemoveBe'4\' 10"'mJZnjcpIf$  (1.473 m), weight 117 lb (53.071 kg), SpO2 100 %.  LABORATORY DATA: Lab Results  Component Value Date   WBC 4.4 10/31/2014   HGB 10.8* 10/31/2014   HCT 34.6* 10/31/2014   MCV 98.0 10/31/2014   PLT 253 10/31/2014      Chemistry      Component Value Date/Time   NA 144 10/31/2014 0943   NA 139 09/07/2014 1624   K 3.3* 10/31/2014 0943   K 4.0 09/07/2014 1624   CL  109 09/07/2014 1624   CL 107 02/14/2013 1311   CO2 26 10/31/2014 0943   CO2 23 09/07/2014 1624   BUN 12.3 10/31/2014 0943   BUN 28* 09/07/2014 1624   CREATININE 0.9 10/31/2014 0943   CREATININE 1.12* 09/07/2014 1624      Component Value Date/Time   CALCIUM 9.8 10/17/2014 1146   CALCIUM 8.8 09/07/2014 1624   CALCIUM * 06/13/2008 1605    5.7 CRITICAL RESULT CALLED TO, READ BACK BY AND VERIFIED WITH: JAMIE TRACY,RN [id number] @ [address] (NOTE)  Amended report. Result repeated and verified. CORRECTED ON 10/14 AT 1429: PREVIOUSLY REPORTED AS Result repeated and verified.   ALKPHOS 59 10/17/2014 1146   ALKPHOS 49 09/19/2014 1447   AST 12 10/17/2014 1146   AST 18 09/19/2014 1447   ALT 11 10/17/2014 1146   ALT 24 09/19/2014 1447   BILITOT 0.41 10/17/2014 1146   BILITOT 0.4 09/19/2014  1447      RADIOGRAPHIC STUDIES: Dg Hip Unilat With Pelvis 2-3 Views Left  10/25/2014   CLINICAL DATA:  Left hip pain for 5 days. Fall. History of multiple myeloma.  EXAM: LEFT HIP (WITH PELVIS) 2-3 VIEWS  COMPARISON:  None.  FINDINGS: Mild symmetric degenerative changes in the hips bilaterally with joint space narrowing and spurring. SI joints are symmetric and unremarkable. Old right inferior pubic ramus fracture. No acute fracture, subluxation or dislocation.  IMPRESSION: Mild symmetric degenerative changes in the hips bilaterally. No acute findings.   Electronically Signed   By: Rolm Baptise M.D.   On: 10/25/2014 08:37    ASSESSMENT AND PLAN: This is a very pleasant 79 years old African-American female with history of multiple myeloma status post several chemotherapy regimens lastly including systemic chemotherapy according to the [facility] CA 204143 clinical trial with Elotuzumab, Revlimid and dexamethasone. She status post 5 cycles and tolerating her treatment fairly well. This was discontinued secondary to disease progression. Unfortunately the recent myeloma panel showed significant increase in the free lambda light chain. I discussed the lab result with the patient and her daughters. She completed treatment with Elotuzumab, Revlimid and dexamethasone but this was discontinued secondary to disease progression. The patient was started on treatment with systemic chemotherapy with Carfilzomib, Cytoxan and dexamethasone as salvage therapy. She status post 2 cycles and tolerating this treatment fairly well. I will repeat myeloma panel today. I recommended for the patient to continue her current treatment with Carfilzomib, Cytoxan and dexamethasone. She would come back for follow-up visit in one month's for reevaluation. She was advised to call immediately if she has any concerning symptoms in the interval. The patient voices understanding of current disease status and treatment options and is in  agreement with the current care plan.  All questions were answered. The patient knows to call the clinic with any problems, questions or concerns. We can certainly see the patient much sooner if necessary.  Disclaimer: This note was dictated with voice recognition software. Similar sounding words can inadvertently be transcribed and may not be corrected upon review.

## 2014-10-31 NOTE — Patient Instructions (Signed)
INR below goal again Increase dose to 5 mg on Tues/Thurs/Sat and take 2.5 mg all other days.  Recheck PT/INR on 11/07/14; Lab at 11:15, treatment at 12:15 and coumadin clinic will see you in the infusion area.

## 2014-10-31 NOTE — Patient Instructions (Signed)
[facility] Discharge Instructions for Patients Receiving Chemotherapy  Today you received the following chemotherapy agents Cytoxan and Kyprolis.  To help prevent nausea and vomiting after your treatment, we encourage you to take your nausea medication as prescribed.   If you develop nausea and vomiting that is not controlled by your nausea medication, call the clinic.   BELOW ARE SYMPTOMS THAT SHOULD BE REPORTED IMMEDIATELY:  *FEVER GREATER THAN 100.5 F  *CHILLS WITH OR WITHOUT FEVER  NAUSEA AND VOMITING THAT IS NOT CONTROLLED WITH YOUR NAUSEA MEDICATION  *UNUSUAL SHORTNESS OF BREATH  *UNUSUAL BRUISING OR BLEEDING  TENDERNESS IN MOUTH AND THROAT WITH OR WITHOUT PRESENCE OF ULCERS  *URINARY PROBLEMS  *BOWEL PROBLEMS  UNUSUAL RASH Items with * indicate a potential emergency and should be followed up as soon as possible.  Feel free to call the clinic you have any questions or concerns. The clinic phone number is [phone].

## 2014-10-31 NOTE — Telephone Encounter (Signed)
Pt confirmed labs/ov per 03/01 POF, gave pt AVS... KJ, sent msg to add chemo

## 2014-10-31 NOTE — Telephone Encounter (Signed)
Per staff message and POF I have scheduled appts. Advised scheduler of appts. JMW  

## 2014-11-01 ENCOUNTER — Ambulatory Visit (HOSPITAL_BASED_OUTPATIENT_CLINIC_OR_DEPARTMENT_OTHER): Payer: Medicare Other

## 2014-11-01 DIAGNOSIS — C9000 Multiple myeloma not having achieved remission: Secondary | ICD-10-CM | POA: Diagnosis not present

## 2014-11-01 DIAGNOSIS — Z5112 Encounter for antineoplastic immunotherapy: Secondary | ICD-10-CM

## 2014-11-01 MED ORDER — ONDANSETRON 8 MG/NS 50 ML IVPB
INTRAVENOUS | Status: AC
  Filled 2014-11-01: qty 8

## 2014-11-01 MED ORDER — DEXAMETHASONE SODIUM PHOSPHATE 10 MG/ML IJ SOLN
INTRAMUSCULAR | Status: AC
  Filled 2014-11-01: qty 1

## 2014-11-01 MED ORDER — SODIUM CHLORIDE 0.9 % IV SOLN
Freq: Once | INTRAVENOUS | Status: AC
  Administered 2014-11-01: 13:00:00 via INTRAVENOUS

## 2014-11-01 MED ORDER — DEXAMETHASONE SODIUM PHOSPHATE 10 MG/ML IJ SOLN
10.0000 mg | Freq: Once | INTRAMUSCULAR | Status: AC
  Administered 2014-11-01: 10 mg via INTRAVENOUS

## 2014-11-01 MED ORDER — DEXTROSE 5 % IV SOLN
36.0000 mg/m2 | Freq: Once | INTRAVENOUS | Status: AC
  Administered 2014-11-01: 54 mg via INTRAVENOUS
  Filled 2014-11-01: qty 27

## 2014-11-01 MED ORDER — ONDANSETRON 8 MG/50ML IVPB (CHCC)
8.0000 mg | Freq: Once | INTRAVENOUS | Status: AC
  Administered 2014-11-01: 8 mg via INTRAVENOUS

## 2014-11-02 LAB — BETA 2 MICROGLOBULIN, SERUM: Beta-2 Microglobulin: 3.16 mg/L — ABNORMAL HIGH (ref <=2.51)

## 2014-11-02 LAB — KAPPA/LAMBDA LIGHT CHAINS
KAPPA LAMBDA RATIO: 0 — AB (ref 0.26–1.65)
Kappa free light chain: 0.03 mg/dL — ABNORMAL LOW (ref 0.33–1.94)
Lambda Free Lght Chn: 58.1 mg/dL — ABNORMAL HIGH (ref 0.57–2.63)

## 2014-11-02 LAB — IGG, IGA, IGM
IGM, SERUM: 18 mg/dL — AB (ref 52–322)
IgA: 9 mg/dL — ABNORMAL LOW (ref 69–380)
IgG (Immunoglobin G), Serum: 250 mg/dL — ABNORMAL LOW (ref 690–1700)

## 2014-11-07 ENCOUNTER — Other Ambulatory Visit: Payer: Self-pay | Admitting: Family Medicine

## 2014-11-07 ENCOUNTER — Other Ambulatory Visit: Payer: Self-pay | Admitting: Medical Oncology

## 2014-11-07 ENCOUNTER — Telehealth: Payer: Self-pay | Admitting: Medical Oncology

## 2014-11-07 ENCOUNTER — Ambulatory Visit (HOSPITAL_BASED_OUTPATIENT_CLINIC_OR_DEPARTMENT_OTHER): Payer: Medicare Other

## 2014-11-07 ENCOUNTER — Other Ambulatory Visit (HOSPITAL_BASED_OUTPATIENT_CLINIC_OR_DEPARTMENT_OTHER): Payer: Medicare Other

## 2014-11-07 ENCOUNTER — Ambulatory Visit (HOSPITAL_BASED_OUTPATIENT_CLINIC_OR_DEPARTMENT_OTHER): Payer: Self-pay | Admitting: Pharmacist

## 2014-11-07 DIAGNOSIS — Z7901 Long term (current) use of anticoagulants: Secondary | ICD-10-CM

## 2014-11-07 DIAGNOSIS — Z5111 Encounter for antineoplastic chemotherapy: Secondary | ICD-10-CM

## 2014-11-07 DIAGNOSIS — I82509 Chronic embolism and thrombosis of unspecified deep veins of unspecified lower extremity: Secondary | ICD-10-CM

## 2014-11-07 DIAGNOSIS — C9000 Multiple myeloma not having achieved remission: Secondary | ICD-10-CM

## 2014-11-07 DIAGNOSIS — I82402 Acute embolism and thrombosis of unspecified deep veins of left lower extremity: Secondary | ICD-10-CM

## 2014-11-07 DIAGNOSIS — Z5112 Encounter for antineoplastic immunotherapy: Secondary | ICD-10-CM

## 2014-11-07 DIAGNOSIS — I82401 Acute embolism and thrombosis of unspecified deep veins of right lower extremity: Secondary | ICD-10-CM

## 2014-11-07 LAB — COMPREHENSIVE METABOLIC PANEL (CC13)
ALT: 15 U/L (ref 0–55)
ANION GAP: 11 meq/L (ref 3–11)
AST: 13 U/L (ref 5–34)
Albumin: 3 g/dL — ABNORMAL LOW (ref 3.5–5.0)
Alkaline Phosphatase: 58 U/L (ref 40–150)
BILIRUBIN TOTAL: 0.43 mg/dL (ref 0.20–1.20)
BUN: 14 mg/dL (ref 7.0–26.0)
CALCIUM: 8.7 mg/dL (ref 8.4–10.4)
CHLORIDE: 101 meq/L (ref 98–109)
CO2: 29 meq/L (ref 22–29)
Creatinine: 0.9 mg/dL (ref 0.6–1.1)
EGFR: 76 mL/min/{1.73_m2} — AB (ref >90)
GLUCOSE: 212 mg/dL — AB (ref 70–140)
Potassium: 2.8 mEq/L — CL (ref 3.5–5.1)
Sodium: 142 mEq/L (ref 136–145)
TOTAL PROTEIN: 5.4 g/dL — AB (ref 6.4–8.3)

## 2014-11-07 LAB — CBC WITH DIFFERENTIAL/PLATELET
BASO%: 0.2 % (ref 0.0–2.0)
BASOS ABS: 0 10*3/uL (ref 0.0–0.1)
EOS%: 1.3 % (ref 0.0–7.0)
Eosinophils Absolute: 0.1 10*3/uL (ref 0.0–0.5)
HCT: 32.9 % — ABNORMAL LOW (ref 34.8–46.6)
HEMOGLOBIN: 10.3 g/dL — AB (ref 11.6–15.9)
LYMPH#: 0.2 10*3/uL — AB (ref 0.9–3.3)
LYMPH%: 5.6 % — ABNORMAL LOW (ref 14.0–49.7)
MCH: 30.8 pg (ref 25.1–34.0)
MCHC: 31.4 g/dL — ABNORMAL LOW (ref 31.5–36.0)
MCV: 98.1 fL (ref 79.5–101.0)
MONO#: 0.3 10*3/uL (ref 0.1–0.9)
MONO%: 8.6 % (ref 0.0–14.0)
NEUT%: 84.3 % — ABNORMAL HIGH (ref 38.4–76.8)
NEUTROS ABS: 3.3 10*3/uL (ref 1.5–6.5)
Platelets: 119 10*3/uL — ABNORMAL LOW (ref 145–400)
RBC: 3.36 10*6/uL — AB (ref 3.70–5.45)
RDW: 17 % — AB (ref 11.2–14.5)
WBC: 4 10*3/uL (ref 3.9–10.3)

## 2014-11-07 LAB — PROTIME-INR
INR: 2.1 (ref 2.00–3.50)
Protime: 25.2 Seconds — ABNORMAL HIGH (ref 10.6–13.4)

## 2014-11-07 LAB — POCT INR: INR: 2.1

## 2014-11-07 MED ORDER — SODIUM CHLORIDE 0.9 % IV SOLN
Freq: Once | INTRAVENOUS | Status: AC
  Administered 2014-11-07: 12:00:00 via INTRAVENOUS

## 2014-11-07 MED ORDER — SODIUM CHLORIDE 0.9 % IV SOLN
Freq: Once | INTRAVENOUS | Status: AC
  Administered 2014-11-07: 13:00:00 via INTRAVENOUS
  Filled 2014-11-07: qty 4

## 2014-11-07 MED ORDER — SODIUM CHLORIDE 0.9 % IV SOLN: Freq: Once | INTRAVENOUS | Status: DC

## 2014-11-07 MED ORDER — OXYCODONE HCL 5 MG PO TABS: 7.5000 mg | ORAL_TABLET | Freq: Four times a day (QID) | ORAL | Status: DC | PRN

## 2014-11-07 MED ORDER — POTASSIUM CHLORIDE CRYS ER 20 MEQ PO TBCR: 20.0000 meq | EXTENDED_RELEASE_TABLET | Freq: Every day | ORAL | Status: DC

## 2014-11-07 MED ORDER — SODIUM CHLORIDE 0.9 % IV SOLN
300.0000 mg/m2 | Freq: Once | INTRAVENOUS | Status: AC
  Administered 2014-11-07: 440 mg via INTRAVENOUS
  Filled 2014-11-07: qty 22

## 2014-11-07 MED ORDER — DEXTROSE 5 % IV SOLN
36.0000 mg/m2 | Freq: Once | INTRAVENOUS | Status: AC
  Administered 2014-11-07: 54 mg via INTRAVENOUS
  Filled 2014-11-07: qty 27

## 2014-11-07 MED ORDER — ACYCLOVIR 400 MG PO TABS: 400.0000 mg | ORAL_TABLET | Freq: Two times a day (BID) | ORAL | Status: DC

## 2014-11-07 NOTE — Patient Instructions (Signed)
[facility] Discharge Instructions for Patients Receiving Chemotherapy  Today you received the following chemotherapy agents Cytoxan/Kyprolis  To help prevent nausea and vomiting after your treatment, we encourage you to take your nausea medication as prescribed.   If you develop nausea and vomiting that is not controlled by your nausea medication, call the clinic.   BELOW ARE SYMPTOMS THAT SHOULD BE REPORTED IMMEDIATELY:  *FEVER GREATER THAN 100.5 F  *CHILLS WITH OR WITHOUT FEVER  NAUSEA AND VOMITING THAT IS NOT CONTROLLED WITH YOUR NAUSEA MEDICATION  *UNUSUAL SHORTNESS OF BREATH  *UNUSUAL BRUISING OR BLEEDING  TENDERNESS IN MOUTH AND THROAT WITH OR WITHOUT PRESENCE OF ULCERS  *URINARY PROBLEMS  *BOWEL PROBLEMS  UNUSUAL RASH Items with * indicate a potential emergency and should be followed up as soon as possible.  Feel free to call the clinic you have any questions or concerns. The clinic phone number is [phone].

## 2014-11-07 NOTE — Progress Notes (Signed)
Noted pt's potassium to be low @2 .8  Spoke with Diane with Dr. Julien Nordmann. Oral potassium has been called in to pt's pharmacy. Pt. Educated on new medication which is 1 x a day.  She voiced understanding

## 2014-11-07 NOTE — Telephone Encounter (Signed)
Daughter notified to pick up kdur rx for her mother.

## 2014-11-07 NOTE — Progress Notes (Signed)
INR = 2.1 on Coumadin 2.5 mg daily except 5 mg on Tu/Th/Sat Pt feels "a little better" today.  She is getting chemo today (Kyprolis). Pt reports no bleeding & no bruises. INR therapeutic.  No change necessary to Coumadin dose. Repeat protime in 1 week & we'll see her back in infusion. Kennith Center, Pharm.D., CPP 11/07/2014@12 :49 PM

## 2014-11-08 ENCOUNTER — Ambulatory Visit (HOSPITAL_BASED_OUTPATIENT_CLINIC_OR_DEPARTMENT_OTHER): Payer: Medicare Other

## 2014-11-08 ENCOUNTER — Other Ambulatory Visit: Payer: Self-pay | Admitting: Physician Assistant

## 2014-11-08 ENCOUNTER — Other Ambulatory Visit: Payer: Self-pay | Admitting: Internal Medicine

## 2014-11-08 DIAGNOSIS — C9000 Multiple myeloma not having achieved remission: Secondary | ICD-10-CM | POA: Diagnosis not present

## 2014-11-08 DIAGNOSIS — Z5112 Encounter for antineoplastic immunotherapy: Secondary | ICD-10-CM | POA: Diagnosis not present

## 2014-11-08 MED ORDER — SODIUM CHLORIDE 0.9 % IV SOLN
Freq: Once | INTRAVENOUS | Status: AC
  Administered 2014-11-08: 13:00:00 via INTRAVENOUS
  Filled 2014-11-08: qty 4

## 2014-11-08 MED ORDER — ALPRAZOLAM 0.5 MG PO TABS: 0.5000 mg | ORAL_TABLET | Freq: Two times a day (BID) | ORAL | Status: DC | PRN

## 2014-11-08 MED ORDER — SODIUM CHLORIDE 0.9 % IV SOLN
Freq: Once | INTRAVENOUS | Status: AC
  Administered 2014-11-08: 13:00:00 via INTRAVENOUS

## 2014-11-08 MED ORDER — CARFILZOMIB CHEMO INJECTION 60 MG
36.0000 mg/m2 | Freq: Once | INTRAVENOUS | Status: AC
  Administered 2014-11-08: 54 mg via INTRAVENOUS
  Filled 2014-11-08: qty 27

## 2014-11-08 MED ORDER — SODIUM CHLORIDE 0.9 % IV SOLN: Freq: Once | INTRAVENOUS | Status: DC

## 2014-11-08 NOTE — Progress Notes (Signed)
Quick Note:  Call patient with the result and order K Dur 40 meq po qd X 7 ______

## 2014-11-08 NOTE — Patient Instructions (Signed)
[facility] Discharge Instructions for Patients Receiving Chemotherapy  Today you received the following chemotherapy agents kyprolis  To help prevent nausea and vomiting after your treatment, we encourage you to take your nausea medication as directed   If you develop nausea and vomiting that is not controlled by your nausea medication, call the clinic.   BELOW ARE SYMPTOMS THAT SHOULD BE REPORTED IMMEDIATELY:  *FEVER GREATER THAN 100.5 F  *CHILLS WITH OR WITHOUT FEVER  NAUSEA AND VOMITING THAT IS NOT CONTROLLED WITH YOUR NAUSEA MEDICATION  *UNUSUAL SHORTNESS OF BREATH  *UNUSUAL BRUISING OR BLEEDING  TENDERNESS IN MOUTH AND THROAT WITH OR WITHOUT PRESENCE OF ULCERS  *URINARY PROBLEMS  *BOWEL PROBLEMS  UNUSUAL RASH Items with * indicate a potential emergency and should be followed up as soon as possible.  Feel free to call the clinic you have any questions or concerns. The clinic phone number is [phone].

## 2014-11-09 ENCOUNTER — Other Ambulatory Visit: Payer: Self-pay | Admitting: Internal Medicine

## 2014-11-09 ENCOUNTER — Other Ambulatory Visit: Payer: Self-pay | Admitting: *Deleted

## 2014-11-09 DIAGNOSIS — C9000 Multiple myeloma not having achieved remission: Secondary | ICD-10-CM

## 2014-11-09 MED ORDER — TEMAZEPAM 30 MG PO CAPS: 30.0000 mg | ORAL_CAPSULE | Freq: Every evening | ORAL | Status: DC | PRN

## 2014-11-14 ENCOUNTER — Ambulatory Visit (HOSPITAL_BASED_OUTPATIENT_CLINIC_OR_DEPARTMENT_OTHER): Payer: Medicare Other

## 2014-11-14 ENCOUNTER — Ambulatory Visit (HOSPITAL_BASED_OUTPATIENT_CLINIC_OR_DEPARTMENT_OTHER): Payer: Medicare Other | Admitting: Pharmacist

## 2014-11-14 ENCOUNTER — Other Ambulatory Visit (HOSPITAL_BASED_OUTPATIENT_CLINIC_OR_DEPARTMENT_OTHER): Payer: Medicare Other

## 2014-11-14 DIAGNOSIS — Z5111 Encounter for antineoplastic chemotherapy: Secondary | ICD-10-CM

## 2014-11-14 DIAGNOSIS — Z5112 Encounter for antineoplastic immunotherapy: Secondary | ICD-10-CM

## 2014-11-14 DIAGNOSIS — I82401 Acute embolism and thrombosis of unspecified deep veins of right lower extremity: Secondary | ICD-10-CM

## 2014-11-14 DIAGNOSIS — Z7901 Long term (current) use of anticoagulants: Secondary | ICD-10-CM | POA: Diagnosis not present

## 2014-11-14 DIAGNOSIS — C9000 Multiple myeloma not having achieved remission: Secondary | ICD-10-CM

## 2014-11-14 LAB — COMPREHENSIVE METABOLIC PANEL (CC13)
ALT: 15 U/L (ref 0–55)
AST: 13 U/L (ref 5–34)
Albumin: 3.1 g/dL — ABNORMAL LOW (ref 3.5–5.0)
Alkaline Phosphatase: 55 U/L (ref 40–150)
Anion Gap: 10 mEq/L (ref 3–11)
BUN: 9.7 mg/dL (ref 7.0–26.0)
CO2: 26 mEq/L (ref 22–29)
Calcium: 9.2 mg/dL (ref 8.4–10.4)
Chloride: 107 mEq/L (ref 98–109)
Creatinine: 0.9 mg/dL (ref 0.6–1.1)
EGFR: 74 mL/min/{1.73_m2} — AB (ref >90)
GLUCOSE: 168 mg/dL — AB (ref 70–140)
Potassium: 3.7 mEq/L (ref 3.5–5.1)
Sodium: 143 mEq/L (ref 136–145)
TOTAL PROTEIN: 5.5 g/dL — AB (ref 6.4–8.3)
Total Bilirubin: 0.44 mg/dL (ref 0.20–1.20)

## 2014-11-14 LAB — CBC WITH DIFFERENTIAL/PLATELET
BASO%: 0 % (ref 0.0–2.0)
BASOS ABS: 0 10*3/uL (ref 0.0–0.1)
EOS%: 1.2 % (ref 0.0–7.0)
Eosinophils Absolute: 0 10*3/uL (ref 0.0–0.5)
HCT: 33.8 % — ABNORMAL LOW (ref 34.8–46.6)
HEMOGLOBIN: 10.7 g/dL — AB (ref 11.6–15.9)
LYMPH%: 10.2 % — ABNORMAL LOW (ref 14.0–49.7)
MCH: 31 pg (ref 25.1–34.0)
MCHC: 31.7 g/dL (ref 31.5–36.0)
MCV: 98 fL (ref 79.5–101.0)
MONO#: 0.3 10*3/uL (ref 0.1–0.9)
MONO%: 9 % (ref 0.0–14.0)
NEUT%: 79.6 % — AB (ref 38.4–76.8)
NEUTROS ABS: 2.6 10*3/uL (ref 1.5–6.5)
PLATELETS: 161 10*3/uL (ref 145–400)
RBC: 3.45 10*6/uL — ABNORMAL LOW (ref 3.70–5.45)
RDW: 16.3 % — ABNORMAL HIGH (ref 11.2–14.5)
WBC: 3.2 10*3/uL — ABNORMAL LOW (ref 3.9–10.3)
lymph#: 0.3 10*3/uL — ABNORMAL LOW (ref 0.9–3.3)

## 2014-11-14 LAB — PROTIME-INR
INR: 1.9 — ABNORMAL LOW (ref 2.00–3.50)
PROTIME: 22.8 s — AB (ref 10.6–13.4)

## 2014-11-14 LAB — POCT INR: INR: 1.9

## 2014-11-14 MED ORDER — SODIUM CHLORIDE 0.9 % IV SOLN
Freq: Once | INTRAVENOUS | Status: AC
  Administered 2014-11-14: 14:00:00 via INTRAVENOUS

## 2014-11-14 MED ORDER — SODIUM CHLORIDE 0.9 % IV SOLN
300.0000 mg/m2 | Freq: Once | INTRAVENOUS | Status: AC
  Administered 2014-11-14: 440 mg via INTRAVENOUS
  Filled 2014-11-14: qty 22

## 2014-11-14 MED ORDER — DEXAMETHASONE SODIUM PHOSPHATE 100 MG/10ML IJ SOLN
Freq: Once | INTRAMUSCULAR | Status: AC
  Administered 2014-11-14: 14:00:00 via INTRAVENOUS
  Filled 2014-11-14: qty 4

## 2014-11-14 MED ORDER — DEXTROSE 5 % IV SOLN
36.0000 mg/m2 | Freq: Once | INTRAVENOUS | Status: AC
  Administered 2014-11-14: 54 mg via INTRAVENOUS
  Filled 2014-11-14: qty 27

## 2014-11-14 NOTE — Patient Instructions (Signed)
[facility] Discharge Instructions for Patients Receiving Chemotherapy  Today you received the following chemotherapy agents: Cytoxan and Kyprolis.  To help prevent nausea and vomiting after your treatment, we encourage you to take your nausea medication: Compazine 10 mg every 6 hours as needed.   If you develop nausea and vomiting that is not controlled by your nausea medication, call the clinic.   BELOW ARE SYMPTOMS THAT SHOULD BE REPORTED IMMEDIATELY:  *FEVER GREATER THAN 100.5 F  *CHILLS WITH OR WITHOUT FEVER  NAUSEA AND VOMITING THAT IS NOT CONTROLLED WITH YOUR NAUSEA MEDICATION  *UNUSUAL SHORTNESS OF BREATH  *UNUSUAL BRUISING OR BLEEDING  TENDERNESS IN MOUTH AND THROAT WITH OR WITHOUT PRESENCE OF ULCERS  *URINARY PROBLEMS  *BOWEL PROBLEMS  UNUSUAL RASH Items with * indicate a potential emergency and should be followed up as soon as possible.  Feel free to call the clinic you have any questions or concerns. The clinic phone number is [phone].

## 2014-11-14 NOTE — Progress Notes (Signed)
INR right at goal today at 1.9 (Goal 2-3) Pt seen in infusion area Pt is doing well with no complaints No missed or extra doses No diet or medication changes No unusual bleeding or bruising Pt already scheduled to take slightly more coumadin tonight so will make no changes this week Plans: No changes Continue Coumadin 5 mg on Tues/Thurs/Sat and take 2.5 mg all other days.  Recheck PT/INR on 11/28/14 - will see you in the infusion area.

## 2014-11-14 NOTE — Patient Instructions (Signed)
INR right at goal No changes Continue Coumadin 5 mg on Tues/Thurs/Sat and take 2.5 mg all other days.  Recheck PT/INR on 11/28/14 - will see you in the infusion area.

## 2014-11-15 ENCOUNTER — Ambulatory Visit (HOSPITAL_BASED_OUTPATIENT_CLINIC_OR_DEPARTMENT_OTHER): Payer: Medicare Other

## 2014-11-15 DIAGNOSIS — C9000 Multiple myeloma not having achieved remission: Secondary | ICD-10-CM | POA: Diagnosis not present

## 2014-11-15 DIAGNOSIS — Z5112 Encounter for antineoplastic immunotherapy: Secondary | ICD-10-CM

## 2014-11-15 MED ORDER — DEXTROSE 5 % IV SOLN
36.0000 mg/m2 | Freq: Once | INTRAVENOUS | Status: AC
  Administered 2014-11-15: 54 mg via INTRAVENOUS
  Filled 2014-11-15: qty 27

## 2014-11-15 MED ORDER — DEXAMETHASONE SODIUM PHOSPHATE 100 MG/10ML IJ SOLN
Freq: Once | INTRAMUSCULAR | Status: AC
  Administered 2014-11-15: 13:00:00 via INTRAVENOUS
  Filled 2014-11-15: qty 4

## 2014-11-15 MED ORDER — SODIUM CHLORIDE 0.9 % IV SOLN
Freq: Once | INTRAVENOUS | Status: AC
  Administered 2014-11-15: 13:00:00 via INTRAVENOUS

## 2014-11-15 NOTE — Patient Instructions (Signed)
[facility] Discharge Instructions for Patients Receiving Chemotherapy  Today you received the following chemotherapy agents kyprolis  To help prevent nausea and vomiting after your treatment, we encourage you to take your nausea medication as directed   If you develop nausea and vomiting that is not controlled by your nausea medication, call the clinic.   BELOW ARE SYMPTOMS THAT SHOULD BE REPORTED IMMEDIATELY:  *FEVER GREATER THAN 100.5 F  *CHILLS WITH OR WITHOUT FEVER  NAUSEA AND VOMITING THAT IS NOT CONTROLLED WITH YOUR NAUSEA MEDICATION  *UNUSUAL SHORTNESS OF BREATH  *UNUSUAL BRUISING OR BLEEDING  TENDERNESS IN MOUTH AND THROAT WITH OR WITHOUT PRESENCE OF ULCERS  *URINARY PROBLEMS  *BOWEL PROBLEMS  UNUSUAL RASH Items with * indicate a potential emergency and should be followed up as soon as possible.  Feel free to call the clinic you have any questions or concerns. The clinic phone number is [phone].

## 2014-11-20 ENCOUNTER — Other Ambulatory Visit: Payer: Self-pay | Admitting: Internal Medicine

## 2014-11-21 ENCOUNTER — Other Ambulatory Visit: Payer: Medicare Other

## 2014-11-21 ENCOUNTER — Other Ambulatory Visit: Payer: Self-pay | Admitting: Medical Oncology

## 2014-11-21 ENCOUNTER — Other Ambulatory Visit: Payer: Self-pay | Admitting: *Deleted

## 2014-11-21 ENCOUNTER — Telehealth: Payer: Self-pay | Admitting: *Deleted

## 2014-11-21 DIAGNOSIS — C9000 Multiple myeloma not having achieved remission: Secondary | ICD-10-CM

## 2014-11-21 MED ORDER — PROCHLORPERAZINE MALEATE 10 MG PO TABS: 10.0000 mg | ORAL_TABLET | Freq: Four times a day (QID) | ORAL | Status: DC | PRN

## 2014-11-21 MED ORDER — OXYCODONE HCL 5 MG PO TABS: 7.5000 mg | ORAL_TABLET | Freq: Four times a day (QID) | ORAL | Status: DC | PRN

## 2014-11-21 NOTE — Telephone Encounter (Signed)
Patient's daughter called in requesting a refill for oxycondone 5 mg to pick up.

## 2014-11-28 ENCOUNTER — Ambulatory Visit (HOSPITAL_BASED_OUTPATIENT_CLINIC_OR_DEPARTMENT_OTHER): Payer: Medicare Other

## 2014-11-28 ENCOUNTER — Other Ambulatory Visit (HOSPITAL_BASED_OUTPATIENT_CLINIC_OR_DEPARTMENT_OTHER): Payer: Medicare Other

## 2014-11-28 ENCOUNTER — Other Ambulatory Visit: Payer: Self-pay | Admitting: Medical Oncology

## 2014-11-28 ENCOUNTER — Encounter: Payer: Self-pay | Admitting: Nurse Practitioner

## 2014-11-28 ENCOUNTER — Other Ambulatory Visit: Payer: Self-pay | Admitting: Internal Medicine

## 2014-11-28 ENCOUNTER — Ambulatory Visit: Payer: Medicare Other | Admitting: Pharmacist

## 2014-11-28 ENCOUNTER — Other Ambulatory Visit: Payer: Self-pay

## 2014-11-28 ENCOUNTER — Ambulatory Visit (HOSPITAL_BASED_OUTPATIENT_CLINIC_OR_DEPARTMENT_OTHER): Payer: Medicare Other | Admitting: Nurse Practitioner

## 2014-11-28 DIAGNOSIS — Z7901 Long term (current) use of anticoagulants: Secondary | ICD-10-CM

## 2014-11-28 DIAGNOSIS — L989 Disorder of the skin and subcutaneous tissue, unspecified: Secondary | ICD-10-CM | POA: Diagnosis not present

## 2014-11-28 DIAGNOSIS — Z5111 Encounter for antineoplastic chemotherapy: Secondary | ICD-10-CM

## 2014-11-28 DIAGNOSIS — C9000 Multiple myeloma not having achieved remission: Secondary | ICD-10-CM

## 2014-11-28 DIAGNOSIS — Z5112 Encounter for antineoplastic immunotherapy: Secondary | ICD-10-CM | POA: Diagnosis not present

## 2014-11-28 DIAGNOSIS — E876 Hypokalemia: Secondary | ICD-10-CM

## 2014-11-28 DIAGNOSIS — I82401 Acute embolism and thrombosis of unspecified deep veins of right lower extremity: Secondary | ICD-10-CM

## 2014-11-28 DIAGNOSIS — R234 Changes in skin texture: Secondary | ICD-10-CM

## 2014-11-28 LAB — CBC WITH DIFFERENTIAL/PLATELET
BASO%: 0.4 % (ref 0.0–2.0)
BASOS ABS: 0 10*3/uL (ref 0.0–0.1)
EOS%: 1.3 % (ref 0.0–7.0)
Eosinophils Absolute: 0 10*3/uL (ref 0.0–0.5)
HCT: 34.9 % (ref 34.8–46.6)
HEMOGLOBIN: 11.2 g/dL — AB (ref 11.6–15.9)
LYMPH%: 7.8 % — AB (ref 14.0–49.7)
MCH: 30.4 pg (ref 25.1–34.0)
MCHC: 32 g/dL (ref 31.5–36.0)
MCV: 95.1 fL (ref 79.5–101.0)
MONO#: 0.5 10*3/uL (ref 0.1–0.9)
MONO%: 13 % (ref 0.0–14.0)
NEUT#: 2.8 10*3/uL (ref 1.5–6.5)
NEUT%: 77.5 % — ABNORMAL HIGH (ref 38.4–76.8)
Platelets: 278 10*3/uL (ref 145–400)
RBC: 3.67 10*6/uL — ABNORMAL LOW (ref 3.70–5.45)
RDW: 16.7 % — ABNORMAL HIGH (ref 11.2–14.5)
WBC: 3.6 10*3/uL — ABNORMAL LOW (ref 3.9–10.3)
lymph#: 0.3 10*3/uL — ABNORMAL LOW (ref 0.9–3.3)

## 2014-11-28 LAB — PROTIME-INR
INR: 2.1 (ref 2.00–3.50)
Protime: 25.2 Seconds — ABNORMAL HIGH (ref 10.6–13.4)

## 2014-11-28 LAB — COMPREHENSIVE METABOLIC PANEL (CC13)
ALK PHOS: 57 U/L (ref 40–150)
ALT: 17 U/L (ref 0–55)
ANION GAP: 12 meq/L — AB (ref 3–11)
AST: 17 U/L (ref 5–34)
Albumin: 3.2 g/dL — ABNORMAL LOW (ref 3.5–5.0)
BILIRUBIN TOTAL: 0.55 mg/dL (ref 0.20–1.20)
BUN: 12.2 mg/dL (ref 7.0–26.0)
CO2: 28 meq/L (ref 22–29)
Calcium: 9.4 mg/dL (ref 8.4–10.4)
Chloride: 103 mEq/L (ref 98–109)
Creatinine: 0.8 mg/dL (ref 0.6–1.1)
EGFR: 85 mL/min/{1.73_m2} — ABNORMAL LOW (ref >90)
GLUCOSE: 117 mg/dL (ref 70–140)
Potassium: 2.9 mEq/L — CL (ref 3.5–5.1)
Sodium: 143 mEq/L (ref 136–145)
Total Protein: 5.6 g/dL — ABNORMAL LOW (ref 6.4–8.3)

## 2014-11-28 LAB — POCT INR: INR: 2.1

## 2014-11-28 MED ORDER — POTASSIUM CHLORIDE CRYS ER 20 MEQ PO TBCR: 40.0000 meq | EXTENDED_RELEASE_TABLET | Freq: Every day | ORAL | Status: DC

## 2014-11-28 MED ORDER — CYCLOPHOSPHAMIDE CHEMO INJECTION 1 GM
300.0000 mg/m2 | Freq: Once | INTRAMUSCULAR | Status: AC
  Administered 2014-11-28: 440 mg via INTRAVENOUS
  Filled 2014-11-28: qty 22

## 2014-11-28 MED ORDER — SODIUM CHLORIDE 0.9 % IV SOLN
Freq: Once | INTRAVENOUS | Status: AC
  Administered 2014-11-28: 12:00:00 via INTRAVENOUS
  Filled 2014-11-28: qty 4

## 2014-11-28 MED ORDER — DEXTROSE 5 % IV SOLN
36.0000 mg/m2 | Freq: Once | INTRAVENOUS | Status: AC
  Administered 2014-11-28: 54 mg via INTRAVENOUS
  Filled 2014-11-28: qty 27

## 2014-11-28 MED ORDER — SODIUM CHLORIDE 0.9 % IV SOLN
Freq: Once | INTRAVENOUS | Status: AC
  Administered 2014-11-28: 11:00:00 via INTRAVENOUS

## 2014-11-28 NOTE — Assessment & Plan Note (Signed)
C patient presented to the [facility] today to receive cycle 4, day 1 of her chemotherapy regimen.  Blood counts remain stable.  Patient has plans to return tomorrow for cycle 4, day 2 of the same regimen.  Also, patient received her last Zometa infusion on 02/24/2015.  She receives the Zometa infusions on an every three-month basis.  She'll be due for her next Zometa infusion on 12/05/2014.  Patient has plans to return on 12/05/2014 for labs, visit, and her next Zometa infusion.

## 2014-11-28 NOTE — Progress Notes (Signed)
 SYMPTOM MANAGEMENT CLINIC   HPI: [name] 79 y.o. female diagnosed with multiple myeloma.  Patient is status post radiation therapy and multiple chemotherapy regimens in the past.  Currently undergoing carfilzomib/Cytoxan/dexamethasone/Zometa therapy.  Patient presents to the cancer Center today to receive cycle 4, day 1 of her carfilzomib/Cytoxan/dexamethasone chemotherapy regimen.  She denies any specific issues with either appetite or energy level.  She denies any GI issues.  She denies any recent fevers or chills.  Patient is complaining of a chronic left heel crack but has been causing her some discomfort for the past few months.  She states that she has been applying both Neosporin and Vaseline to the crack with only minimal effectiveness.  Patient reports that she is a diabetic; and goes to a podiatrist have her toenails trimmed.   HPI  ROS  Past Medical History  Diagnosis Date  . Blood transfusion   . Depression     Mild  . History of chicken pox   . Hyperlipidemia   . Hypertension   . Degenerative joint disease   . Phlebitis   . Multiple myeloma     per Dr. Mohamed, s/p palliative radiation for leg pain 2011 per Dr. Kinard  . Deep vein thrombosis of bilateral lower extremities   . Family history of anesthesia complication     DAUGHTER CPR AFTER  . Cardiomyopathy   . Diabetes mellitus     Steroid related  . UTI (urinary tract infection) 09/08/2013  . History of radiation therapy 08/30/13-09/20/13    20 gray to lower lumbar/upper sacrum    Past Surgical History  Procedure Laterality Date  . Abdominal hysterectomy    . Ankle fracture surgery Left     has Multiple myeloma; Diabetes mellitus without complication; HYPERLIPIDEMIA; DEPRESSION, MILD; HYPERTENSION; DEGENERATIVE JOINT DISEASE; LEG PAIN, RIGHT; Gait instability; Left hip pain; Colon cancer screening; Deep vein thrombosis of left lower extremity; Deep vein thrombosis of right lower extremity; DNR no  code (do not resuscitate); Chronic steroid use; Delirium; CAP (community acquired pneumonia); Cellulitis, toe; Paronychia; Sinus bradycardia; Hypokalemia; Long term current use of anticoagulant therapy; and Fissure in skin of foot on her problem list.    has No Known Allergies.    Medication List       This list is accurate as of: 11/28/14  3:04 PM.  Always use your most recent med list.               acyclovir 400 MG tablet  Commonly known as:  ZOVIRAX  Take 1 tablet (400 mg total) by mouth 2 (two) times daily.     ADVIL 200 MG tablet  Generic drug:  ibuprofen  Take 400 mg by mouth every 6 (six) hours as needed for headache or moderate pain.     ALPRAZolam 0.5 MG tablet  Commonly known as:  XANAX  Take 1 tablet (0.5 mg total) by mouth 2 (two) times daily as needed for anxiety.     Calcium Carbonate-Vitamin D 600-400 MG-UNIT per tablet  Take 1 tablet by mouth 3 (three) times daily with meals.     dexamethasone 4 MG tablet  Commonly known as:  DECADRON  Take 10 tab (40 mg) weekly except on days when you receive elotuzumab. On those days take 7 tab (28 mg) between 3 & 24 hours prior to the start of elotuzumab.     diphenhydrAMINE 25 MG tablet  Commonly known as:  SOMINEX  Take 25 mg by mouth at bedtime   as needed for sleep.     glipiZIDE 2.5 MG 24 hr tablet  Commonly known as:  GLUCOTROL XL  Take 2.5 mg by mouth daily with breakfast.     lisinopril-hydrochlorothiazide 20-12.5 MG per tablet  Commonly known as:  PRINZIDE,ZESTORETIC  TAKE ONE-HALF TABLET BY MOUTH ONCE DAILY     loperamide 2 MG tablet  Commonly known as:  IMODIUM A-D  Take 1 tablet (2 mg total) by mouth 4 (four) times daily as needed for diarrhea or loose stools.     magic mouthwash w/lidocaine Soln  Take 5 mLs by mouth 3 (three) times daily as needed for mouth pain (do not swallow, swish and spit 5ml TID PRN).     oxyCODONE 5 MG immediate release tablet  Commonly known as:  Oxy IR/ROXICODONE  Take 1.5  tablets (7.5 mg total) by mouth every 6 (six) hours as needed for severe pain.     potassium chloride SA 20 MEQ tablet  Commonly known as:  K-DUR,KLOR-CON  Take 2 tablets (40 mEq total) by mouth daily. X 7 days     prochlorperazine 10 MG tablet  Commonly known as:  COMPAZINE  Take 1 tablet (10 mg total) by mouth every 6 (six) hours as needed.     temazepam 30 MG capsule  Commonly known as:  RESTORIL  TAKE ONE CAPSULE BY MOUTH AT BEDTIME AS NEEDED     warfarin 5 MG tablet  Commonly known as:  COUMADIN  TAKE ONE TABLET BY MOUTH AS DIRECTED         PHYSICAL EXAMINATION  Oncology Vitals 11/28/2014 11/15/2014 11/14/2014 11/08/2014 11/07/2014 11/01/2014 10/31/2014  Height - - - - - - 147 cm  Weight - - - - - - 53.071 kg  Weight (lbs) - - - - - - 117 lbs  BMI (kg/m2) - - - - - - 24.45 kg/m2  Temp 98.1 98.3 98.5 98.4 98.4 98.6 98.2  Pulse 65 67 73 61 72 72 71  Resp 16 18 18 20 - 18 18  Resp (Historical as of 04/01/12) - - - - - - -  SpO2 100 100 100 100 - 99 100  BSA (m2) - - - - - - 1.47 m2   BP Readings from Last 3 Encounters:  11/28/14 129/68  11/15/14 170/62  11/14/14 144/62    Physical Exam  Constitutional: She is oriented to person, place, and time. Vital signs are normal. She appears unhealthy.  HENT:  Head: Normocephalic and atraumatic.  Eyes: Conjunctivae and EOM are normal. Pupils are equal, round, and reactive to light.  Neck: Normal range of motion.  Pulmonary/Chest: Effort normal. No respiratory distress.  Musculoskeletal: Normal range of motion. She exhibits no edema or tenderness.  Neurological: She is alert and oriented to person, place, and time.  Skin: Skin is warm and dry. No rash noted. No erythema.  Left heel with approximately 2 cm crack in her heel.  Area directly below the crack is hardened; but no tenderness, erythema, edema, or red streaks to site.  Psychiatric: Affect normal.  Nursing note and vitals reviewed.   LABORATORY DATA:. Anti-coag visit on  11/28/2014  Component Date Value Ref Range Status  . INR 11/28/2014 2.1   Final  Appointment on 11/28/2014  Component Date Value Ref Range Status  . Protime 11/28/2014 25.2* 10.6 - 13.4 Seconds Final  . INR 11/28/2014 2.10  2.00 - 3.50 Final   Comment: INR is useful only to assess adequacy of anticoagulation with coumadin   when comparing results from different labs. It should not be used to estimate bleeding risk or presence/abscense of coagulopathy in patients not on coumadin. Expected INR ranges for  nontherapeutic patients is 0.88 - 1.12.   . Lovenox 11/28/2014 No   Final  . WBC 11/28/2014 3.6* 3.9 - 10.3 10e3/uL Final  . NEUT# 11/28/2014 2.8  1.5 - 6.5 10e3/uL Final  . HGB 11/28/2014 11.2* 11.6 - 15.9 g/dL Final  . HCT 11/28/2014 34.9  34.8 - 46.6 % Final  . Platelets 11/28/2014 278  145 - 400 10e3/uL Final  . MCV 11/28/2014 95.1  79.5 - 101.0 fL Final  . MCH 11/28/2014 30.4  25.1 - 34.0 pg Final  . MCHC 11/28/2014 32.0  31.5 - 36.0 g/dL Final  . RBC 11/28/2014 3.67* 3.70 - 5.45 10e6/uL Final  . RDW 11/28/2014 16.7* 11.2 - 14.5 % Final  . lymph# 11/28/2014 0.3* 0.9 - 3.3 10e3/uL Final  . MONO# 11/28/2014 0.5  0.1 - 0.9 10e3/uL Final  . Eosinophils Absolute 11/28/2014 0.0  0.0 - 0.5 10e3/uL Final  . Basophils Absolute 11/28/2014 0.0  0.0 - 0.1 10e3/uL Final  . NEUT% 11/28/2014 77.5* 38.4 - 76.8 % Final  . LYMPH% 11/28/2014 7.8* 14.0 - 49.7 % Final  . MONO% 11/28/2014 13.0  0.0 - 14.0 % Final  . EOS% 11/28/2014 1.3  0.0 - 7.0 % Final  . BASO% 11/28/2014 0.4  0.0 - 2.0 % Final  . Sodium 11/28/2014 143  136 - 145 mEq/L Final  . Potassium 11/28/2014 2.9* 3.5 - 5.1 mEq/L Final  . Chloride 11/28/2014 103  98 - 109 mEq/L Final  . CO2 11/28/2014 28  22 - 29 mEq/L Final  . Glucose 11/28/2014 117  70 - 140 mg/dl Final  . BUN 11/28/2014 12.2  7.0 - 26.0 mg/dL Final  . Creatinine 11/28/2014 0.8  0.6 - 1.1 mg/dL Final  . Total Bilirubin 11/28/2014 0.55  0.20 - 1.20 mg/dL Final  . Alkaline  Phosphatase 11/28/2014 57  40 - 150 U/L Final  . AST 11/28/2014 17  5 - 34 U/L Final  . ALT 11/28/2014 17  0 - 55 U/L Final  . Total Protein 11/28/2014 5.6* 6.4 - 8.3 g/dL Final  . Albumin 11/28/2014 3.2* 3.5 - 5.0 g/dL Final  . Calcium 11/28/2014 9.4  8.4 - 10.4 mg/dL Final  . Anion Gap 11/28/2014 12* 3 - 11 mEq/L Final  . EGFR 11/28/2014 85* >90 ml/min/1.73 m2 Final   eGFR is calculated using the CKD-EPI Creatinine Equation (2009)     RADIOGRAPHIC STUDIES: No results found.  ASSESSMENT/PLAN:    Fissure in skin of foot Pt reports large crack to left heel that is somewhat painful x several months. She has been using neosporin and vaseline to site.  Patient is a diabetic; he goes to a podiatrist to have her toenails trimmed.  On exam-patient has an approximate 2 cm crack in her left heel.  The uterus appeared to be some hardened tissue directly below the heel crack; but there is no obvious infection to the site.  There is also minimal to no tenderness with deep palpation as well.  Advised patient to try Aquaphor and Neosporin to site.  She may apply both the Aquaphor in the Neosporin to her foot; and then put on a clean white socks prior to bedtime.  Also, advised the patient called her podiatrist for further evaluation and management.    Patient was given samples of Aquaphor today.   Hypokalemia   Potassium was 2.9 today.  Patient was given a prescription for potassium 40 mEq daily for a total of 7 days.  She was also encouraged to push potassium in her diet.   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   Multiple myeloma C patient presented to the cancer Center today to receive cycle 4, day 1 of her chemotherapy regimen.  Blood counts remain stable.  Patient has plans to return tomorrow for cycle 4, day 2 of the same regimen.  Also, patient received her  last Zometa infusion on 02/24/2015.  She receives the Zometa infusions on an every three-month basis.  She'll be due for her next Zometa infusion on 12/05/2014.  Patient has plans to return on 12/05/2014 for labs, visit, and her next Zometa infusion.    Patient stated understanding of all instructions; and was in agreement with this plan of care. The patient knows to call the clinic with any problems, questions or concerns.   Review/collaboration with Dr. Mohamed regarding all aspects of patient's visit today.   Total time spent with patient was 15 minutes;  with greater than 75 percent of that time spent in face to face counseling regarding patient's symptoms,  and coordination of care and follow up.  Disclaimer: This note was dictated with voice recognition software. Similar sounding words can inadvertently be transcribed and may not be corrected upon review.   , , NP 11/28/2014    

## 2014-11-28 NOTE — Patient Instructions (Signed)
Continue Coumadin 5 mg on Tues/Thurs/Sat and take 2.5 mg all other days.  Recheck PT/INR on 12/12/14 - will see you in the infusion area. Lab: 10:15 Infusion: 10:45 Coumadin Clinic 11:00 (during your infusion)

## 2014-11-28 NOTE — Assessment & Plan Note (Signed)
Potassium was 2.9 today.  Patient was given a prescription for potassium 40 mEq daily for a total of 7 days.  She was also encouraged to push potassium in her diet.

## 2014-11-28 NOTE — Progress Notes (Signed)
Pt seen during her infusion today No changes to report Continue Coumadin 5 mg on Tues/Thurs/Sat and take 2.5 mg all other days.  Recheck PT/INR on 12/12/14 - will see you in the infusion area. Lab: 10:15 Infusion: 10:45 Coumadin Clinic 11:00 (during your infusion)

## 2014-11-28 NOTE — Assessment & Plan Note (Signed)
Patient has a history of chronic bilateral DVTs to her legs; and remains on Coumadin which is managed by the Coumadin clinic.

## 2014-11-28 NOTE — Assessment & Plan Note (Addendum)
Pt reports large crack to left heel that is somewhat painful x several months. She has been using neosporin and vaseline to site.  Patient is a diabetic; he goes to a podiatrist to have her toenails trimmed.  On exam-patient has an approximate 2 cm crack in her left heel.  The uterus appeared to be some hardened tissue directly below the heel crack; but there is no obvious infection to the site.  There is also minimal to no tenderness with deep palpation as well.  Advised patient to try Aquaphor and Neosporin to site.  She may apply both the Aquaphor in the Neosporin to her foot; and then put on a clean white socks prior to bedtime.  Also, advised the patient called her podiatrist for further evaluation and management.    Patient was given samples of Aquaphor today.

## 2014-11-29 ENCOUNTER — Ambulatory Visit (HOSPITAL_BASED_OUTPATIENT_CLINIC_OR_DEPARTMENT_OTHER): Payer: Medicare Other

## 2014-11-29 DIAGNOSIS — C9000 Multiple myeloma not having achieved remission: Secondary | ICD-10-CM

## 2014-11-29 DIAGNOSIS — Z5112 Encounter for antineoplastic immunotherapy: Secondary | ICD-10-CM

## 2014-11-29 MED ORDER — DEXTROSE 5 % IV SOLN: 36.0000 mg/m2 | Freq: Once | INTRAVENOUS | Status: DC

## 2014-11-29 MED ORDER — SODIUM CHLORIDE 0.9 % IV SOLN
Freq: Once | INTRAVENOUS | Status: AC
  Administered 2014-11-29: 14:00:00 via INTRAVENOUS
  Filled 2014-11-29: qty 4

## 2014-11-29 MED ORDER — DEXTROSE 5 % IV SOLN
36.0000 mg/m2 | Freq: Once | INTRAVENOUS | Status: AC
  Administered 2014-11-29: 54 mg via INTRAVENOUS
  Filled 2014-11-29: qty 27

## 2014-11-29 MED ORDER — SODIUM CHLORIDE 0.9 % IV SOLN
Freq: Once | INTRAVENOUS | Status: AC
  Administered 2014-11-29: 14:00:00 via INTRAVENOUS

## 2014-11-29 NOTE — Patient Instructions (Signed)
[name] Discharge Instructions for Patients Receiving Chemotherapy  Today you received the following chemotherapy agents kyprolis  To help prevent nausea and vomiting after your treatment, we encourage you to take your nausea medication as directed   If you develop nausea and vomiting that is not controlled by your nausea medication, call the clinic.   BELOW ARE SYMPTOMS THAT SHOULD BE REPORTED IMMEDIATELY:  *FEVER GREATER THAN 100.5 F  *CHILLS WITH OR WITHOUT FEVER  NAUSEA AND VOMITING THAT IS NOT CONTROLLED WITH YOUR NAUSEA MEDICATION  *UNUSUAL SHORTNESS OF BREATH  *UNUSUAL BRUISING OR BLEEDING  TENDERNESS IN MOUTH AND THROAT WITH OR WITHOUT PRESENCE OF ULCERS  *URINARY PROBLEMS  *BOWEL PROBLEMS  UNUSUAL RASH Items with * indicate a potential emergency and should be followed up as soon as possible.  Feel free to call the clinic you have any questions or concerns. The clinic phone number is [phone].

## 2014-11-30 DIAGNOSIS — B351 Tinea unguium: Secondary | ICD-10-CM | POA: Diagnosis not present

## 2014-11-30 DIAGNOSIS — R234 Changes in skin texture: Secondary | ICD-10-CM | POA: Diagnosis not present

## 2014-11-30 DIAGNOSIS — E119 Type 2 diabetes mellitus without complications: Secondary | ICD-10-CM | POA: Diagnosis not present

## 2014-12-05 ENCOUNTER — Encounter: Payer: Self-pay | Admitting: Internal Medicine

## 2014-12-05 ENCOUNTER — Telehealth: Payer: Self-pay | Admitting: Internal Medicine

## 2014-12-05 ENCOUNTER — Telehealth: Payer: Self-pay | Admitting: *Deleted

## 2014-12-05 ENCOUNTER — Ambulatory Visit (HOSPITAL_BASED_OUTPATIENT_CLINIC_OR_DEPARTMENT_OTHER): Payer: Medicare Other

## 2014-12-05 ENCOUNTER — Other Ambulatory Visit (HOSPITAL_BASED_OUTPATIENT_CLINIC_OR_DEPARTMENT_OTHER): Payer: Medicare Other

## 2014-12-05 ENCOUNTER — Ambulatory Visit (INDEPENDENT_AMBULATORY_CARE_PROVIDER_SITE_OTHER): Payer: Self-pay | Admitting: Pharmacist

## 2014-12-05 ENCOUNTER — Ambulatory Visit (HOSPITAL_BASED_OUTPATIENT_CLINIC_OR_DEPARTMENT_OTHER): Payer: Medicare Other | Admitting: Internal Medicine

## 2014-12-05 VITALS — BP 151/61 | HR 77 | Temp 98.2°F | Resp 18 | Ht <= 58 in | Wt 117.1 lb

## 2014-12-05 DIAGNOSIS — Z5111 Encounter for antineoplastic chemotherapy: Secondary | ICD-10-CM

## 2014-12-05 DIAGNOSIS — I82401 Acute embolism and thrombosis of unspecified deep veins of right lower extremity: Secondary | ICD-10-CM

## 2014-12-05 DIAGNOSIS — Z5112 Encounter for antineoplastic immunotherapy: Secondary | ICD-10-CM

## 2014-12-05 DIAGNOSIS — C9000 Multiple myeloma not having achieved remission: Secondary | ICD-10-CM

## 2014-12-05 DIAGNOSIS — Z86718 Personal history of other venous thrombosis and embolism: Secondary | ICD-10-CM

## 2014-12-05 DIAGNOSIS — Z7901 Long term (current) use of anticoagulants: Secondary | ICD-10-CM

## 2014-12-05 LAB — CBC WITH DIFFERENTIAL/PLATELET
BASO%: 0.2 % (ref 0.0–2.0)
BASOS ABS: 0 10*3/uL (ref 0.0–0.1)
EOS%: 2.9 % (ref 0.0–7.0)
Eosinophils Absolute: 0.1 10*3/uL (ref 0.0–0.5)
HCT: 36.2 % (ref 34.8–46.6)
HEMOGLOBIN: 11.4 g/dL — AB (ref 11.6–15.9)
LYMPH#: 0.2 10*3/uL — AB (ref 0.9–3.3)
LYMPH%: 4.7 % — ABNORMAL LOW (ref 14.0–49.7)
MCH: 30.9 pg (ref 25.1–34.0)
MCHC: 31.5 g/dL (ref 31.5–36.0)
MCV: 98.1 fL (ref 79.5–101.0)
MONO#: 0.3 10*3/uL (ref 0.1–0.9)
MONO%: 6.9 % (ref 0.0–14.0)
NEUT#: 3.8 10*3/uL (ref 1.5–6.5)
NEUT%: 85.3 % — ABNORMAL HIGH (ref 38.4–76.8)
Platelets: 110 10*3/uL — ABNORMAL LOW (ref 145–400)
RBC: 3.69 10*6/uL — ABNORMAL LOW (ref 3.70–5.45)
RDW: 15.8 % — AB (ref 11.2–14.5)
WBC: 4.5 10*3/uL (ref 3.9–10.3)

## 2014-12-05 LAB — COMPREHENSIVE METABOLIC PANEL (CC13)
ALT: 15 U/L (ref 0–55)
ANION GAP: 10 meq/L (ref 3–11)
AST: 15 U/L (ref 5–34)
Albumin: 3.1 g/dL — ABNORMAL LOW (ref 3.5–5.0)
Alkaline Phosphatase: 61 U/L (ref 40–150)
BILIRUBIN TOTAL: 0.42 mg/dL (ref 0.20–1.20)
BUN: 12.9 mg/dL (ref 7.0–26.0)
CO2: 26 mEq/L (ref 22–29)
Calcium: 8.9 mg/dL (ref 8.4–10.4)
Chloride: 104 mEq/L (ref 98–109)
Creatinine: 0.8 mg/dL (ref 0.6–1.1)
EGFR: 88 mL/min/{1.73_m2} — AB (ref >90)
GLUCOSE: 162 mg/dL — AB (ref 70–140)
POTASSIUM: 3.6 meq/L (ref 3.5–5.1)
Sodium: 140 mEq/L (ref 136–145)
TOTAL PROTEIN: 5.6 g/dL — AB (ref 6.4–8.3)

## 2014-12-05 LAB — PROTIME-INR
INR: 2.3 (ref 2.00–3.50)
Protime: 27.6 Seconds — ABNORMAL HIGH (ref 10.6–13.4)

## 2014-12-05 LAB — POCT INR: INR: 2.3

## 2014-12-05 MED ORDER — OXYCODONE HCL 5 MG PO TABS: 7.5000 mg | ORAL_TABLET | Freq: Four times a day (QID) | ORAL | Status: DC | PRN

## 2014-12-05 MED ORDER — CYCLOPHOSPHAMIDE CHEMO INJECTION 1 GM
300.0000 mg/m2 | Freq: Once | INTRAMUSCULAR | Status: AC
  Administered 2014-12-05: 440 mg via INTRAVENOUS
  Filled 2014-12-05: qty 22

## 2014-12-05 MED ORDER — SODIUM CHLORIDE 0.9 % IV SOLN
Freq: Once | INTRAVENOUS | Status: AC
  Administered 2014-12-05: 12:00:00 via INTRAVENOUS

## 2014-12-05 MED ORDER — ALPRAZOLAM 0.5 MG PO TABS: 0.5000 mg | ORAL_TABLET | Freq: Two times a day (BID) | ORAL | Status: DC | PRN

## 2014-12-05 MED ORDER — DEXTROSE 5 % IV SOLN: 36.0000 mg/m2 | Freq: Once | INTRAVENOUS | Status: DC

## 2014-12-05 MED ORDER — DEXTROSE 5 % IV SOLN
36.0000 mg/m2 | Freq: Once | INTRAVENOUS | Status: AC
  Administered 2014-12-05: 54 mg via INTRAVENOUS
  Filled 2014-12-05: qty 27

## 2014-12-05 MED ORDER — ZOLEDRONIC ACID 4 MG/100ML IV SOLN
4.0000 mg | Freq: Once | INTRAVENOUS | Status: AC
  Administered 2014-12-05: 4 mg via INTRAVENOUS
  Filled 2014-12-05: qty 100

## 2014-12-05 MED ORDER — SODIUM CHLORIDE 0.9 % IV SOLN
Freq: Once | INTRAVENOUS | Status: AC
  Administered 2014-12-05: 12:00:00 via INTRAVENOUS
  Filled 2014-12-05: qty 4

## 2014-12-05 NOTE — Telephone Encounter (Signed)
Gave avs & calendar for April/May. Sent message to schedule treatment

## 2014-12-05 NOTE — Progress Notes (Signed)
INR within goal today. Hg/Hct = 11.4/36.2, Pltc = 110 No changes in diet or medications. No missed or extra coumadin doses. No unusual bruising. No bleeding noted. No s/s of clotting noted. Continue Coumadin 5 mg on Tues/Thurs/Sat and take 2.5 mg all other days.  Recheck PT/INR on 12/26/14; Lab at 1:45, [facility] at 2:15pm and Coumadin clinic at 2:45pm.

## 2014-12-05 NOTE — Progress Notes (Signed)
[facility] [phone]   [phone]  OFFICE PROGRESS NOTE  [name], MD [place] [address]  DIAGNOSIS:  1. Multiple myeloma diagnosed in September 2009,  2. history of bilateral lower extremity deep vein thrombosis diagnosed in November 2009  3. acute on chronic deep vein thrombosis in the left lower extremity diagnosed in October 2010.   PRIOR THERAPY:  1. status post palliative radiotherapy to the right femur and ischail tuberosity, first was done in December 2011 and the second treatment was completed Jan 08 2011, and the third treatment to the right distal femur completed on 04/30/2011  2. status post palliative radiotherapy to the left proximal femur completed 02/28/2011.  3. Revlimid 25 mg by mouth daily for 21 days every 4 weeks in addition to oral Decadron at 40 mg by mouth on a weekly basis status post 40 cycles.  4. weekly subcutaneous Velcade 1.3 mg/M2 status post 60 cycles, discontinued today secondary to mild disease progression. 5. Pomalyst 4 mg by mouth daily for 21 days every 4 weeks, the patient started the first dose on 12/22/2012 , in addition to Decadron 40 mg weekly. Status post 13 cycles.  6. Chemotherapy according to the [place] [id number] expanded access treatment protocol with Elotuzumab, lenalidomide and dexamthasone. Status post 5 cycles, discontinued today secondary to disease progression.   CURRENT THERAPY:  1. Systemic chemotherapy with Carfilzomib, Cytoxan and Decadron. First cycle 09/05/2014. Status post 3 cycles. 2. Coumadin followed by the Coumadin clinic at the [facility].  3. Zometa 4 mg IV given every 3 months.  INTERVAL HISTORY: [name] 79 y.o. female returns to the clinic today for follow-up visit accompanied by her daughter. The patient is tolerating her current treatment with Carfilzomib, Cytoxan and Decadron fairly well with no significant adverse effect fairly  well. She has no significant complaints today.. She denied having any significant weight loss or night sweats. She has no nausea or vomiting. The patient denied having any significant chest pain, shortness of breath, cough or hemoptysis. She started cycle #4 last week. She requested refill of Xanax and oxycodone.  MEDICAL HISTORY: Past Medical History  Diagnosis Date  . Blood transfusion   . Depression     Mild  . History of chicken pox   . Hyperlipidemia   . Hypertension   . Degenerative joint disease   . Phlebitis   . Multiple myeloma     per Dr. Julien Nordmann, s/p palliative radiation for leg pain 2011 per Dr. Sondra Come  . Deep vein thrombosis of bilateral lower extremities   . Family history of anesthesia complication     DAUGHTER CPR AFTER  . Cardiomyopathy   . Diabetes mellitus     Steroid related  . UTI (urinary tract infection) 09/08/2013  . History of radiation therapy 08/30/13-09/20/13    20 gray to lower lumbar/upper sacrum    ALLERGIES:  has No Known Allergies.  MEDICATIONS:  Current Outpatient Prescriptions  Medication Sig Dispense Refill  . acyclovir (ZOVIRAX) 400 MG tablet Take 1 tablet (400 mg total) by mouth 2 (two) times daily. 60 tablet 0  . ALPRAZolam (XANAX) 0.5 MG tablet Take 1 tablet (0.5 mg total) by mouth 2 (two) times daily as needed for anxiety. 60 tablet 0  . Alum & Mag Hydroxide-Simeth (MAGIC MOUTHWASH W/LIDOCAINE) SOLN Take 5 mLs by mouth 3 (three) times daily as needed for mouth pain (do not swallow, swish and spit 45ml TID  PRN). 120 mL 0  . Calcium Carbonate-Vitamin D 600-400 MG-UNIT per tablet Take 1 tablet by mouth 3 (three) times daily with meals.      Marland Kitchen dexamethasone (DECADRON) 4 MG tablet Take 10 tab (40 mg) weekly except on days when you receive elotuzumab. On those days take 7 tab (28 mg) between 3 & 24 hours prior to the start of elotuzumab.    Marland Kitchen glipiZIDE (GLUCOTROL XL) 2.5 MG 24 hr tablet Take 2.5 mg by mouth daily with breakfast.    . ibuprofen  (ADVIL) 200 MG tablet Take 400 mg by mouth every 6 (six) hours as needed for headache or moderate pain.    Marland Kitchen lisinopril-hydrochlorothiazide (PRINZIDE,ZESTORETIC) 20-12.5 MG per tablet TAKE ONE-HALF TABLET BY MOUTH ONCE DAILY 45 tablet 1  . loperamide (IMODIUM A-D) 2 MG tablet Take 1 tablet (2 mg total) by mouth 4 (four) times daily as needed for diarrhea or loose stools.    Marland Kitchen oxyCODONE (OXY IR/ROXICODONE) 5 MG immediate release tablet Take 1.5 tablets (7.5 mg total) by mouth every 6 (six) hours as needed for severe pain. 90 tablet 0  . potassium chloride SA (K-DUR,KLOR-CON) 20 MEQ tablet Take 2 tablets (40 mEq total) by mouth daily. X 7 days 7 tablet 0  . prochlorperazine (COMPAZINE) 10 MG tablet Take 1 tablet (10 mg total) by mouth every 6 (six) hours as needed. 30 tablet 0  . temazepam (RESTORIL) 30 MG capsule TAKE ONE CAPSULE BY MOUTH AT BEDTIME AS NEEDED 30 capsule 0  . warfarin (COUMADIN) 5 MG tablet TAKE ONE TABLET BY MOUTH AS DIRECTED 60 tablet 0   No current facility-administered medications for this visit.    SURGICAL HISTORY:  Past Surgical History  Procedure Laterality Date  . Abdominal hysterectomy    . Ankle fracture surgery Left     REVIEW OF SYSTEMS:  Constitutional: positive for fatigue Eyes: negative Ears, nose, mouth, throat, and face: negative Respiratory: negative Cardiovascular: negative Gastrointestinal: negative Genitourinary:negative Integument/breast: negative Hematologic/lymphatic: negative Musculoskeletal:positive for bone pain Neurological: negative Behavioral/Psych: negative Endocrine: negative Allergic/Immunologic: negative   PHYSICAL EXAMINATION: General appearance: alert, cooperative and no distress Head: Normocephalic, without obvious abnormality, atraumatic Neck: no adenopathy, no JVD, supple, symmetrical, trachea midline and thyroid not enlarged, symmetric, no tenderness/mass/nodules Lymph nodes: Cervical, supraclavicular, and axillary nodes  normal. Resp: clear to auscultation bilaterally Back: symmetric, no curvature. ROM normal. No CVA tenderness. Cardio: regular rate and rhythm, S1, S2 normal, no murmur, click, rub or gallop GI: soft, non-tender; bowel sounds normal; no masses,  no organomegaly Extremities: extremities normal, atraumatic, no cyanosis or edema Neurologic: Alert and oriented X 3, normal strength and tone. Normal symmetric reflexes. Normal coordination and gait  ECOG PERFORMANCE STATUS: 1 - Symptomatic but completely ambulatory  Blood pressure 151/61, pulse 77, temperature 98.2 F (36.8 C), temperature source Oral, resp. rate 18, height $RemoveBe'4\' 10"'PZXendQsg$  (1.473 m), weight 117 lb 1.6 oz (53.116 kg), SpO2 100 %.  LABORATORY DATA: Lab Results  Component Value Date   WBC 4.5 12/05/2014   HGB 11.4* 12/05/2014   HCT 36.2 12/05/2014   MCV 98.1 12/05/2014   PLT 110* 12/05/2014      Chemistry      Component Value Date/Time   NA 143 11/28/2014 1040   NA 139 09/07/2014 1624   K 2.9* 11/28/2014 1040   K 4.0 09/07/2014 1624   CL 109 09/07/2014 1624   CL 107 02/14/2013 1311   CO2 28 11/28/2014 1040   CO2 23 09/07/2014 1624  BUN 12.2 11/28/2014 1040   BUN 28* 09/07/2014 1624   CREATININE 0.8 11/28/2014 1040   CREATININE 1.12* 09/07/2014 1624      Component Value Date/Time   CALCIUM 9.4 11/28/2014 1040   CALCIUM 8.8 09/07/2014 1624   CALCIUM * 06/13/2008 1605    5.7 CRITICAL RESULT CALLED TO, READ BACK BY AND VERIFIED WITH: JAMIE TRACY,RN [id number] @ [id number] BY J SCOTTON (NOTE)  Amended report. Result repeated and verified. CORRECTED ON 10/14 AT 1429: PREVIOUSLY REPORTED AS Result repeated and verified.   ALKPHOS 57 11/28/2014 1040   ALKPHOS 49 09/19/2014 1447   AST 17 11/28/2014 1040   AST 18 09/19/2014 1447   ALT 17 11/28/2014 1040   ALT 24 09/19/2014 1447   BILITOT 0.55 11/28/2014 1040   BILITOT 0.4 09/19/2014 1447      RADIOGRAPHIC STUDIES: No results found.  ASSESSMENT AND PLAN: This is a very pleasant  79 years old African-American female with history of multiple myeloma status post several chemotherapy regimens lastly including systemic chemotherapy according to the [facility] [place] 204143 clinical trial with Elotuzumab, Revlimid and dexamethasone. She status post 5 cycles and tolerating her treatment fairly well. This was discontinued secondary to disease progression. Unfortunately the recent myeloma panel showed significant increase in the free lambda light chain. I discussed the lab result with the patient and her daughters. She completed treatment with Elotuzumab, Revlimid and dexamethasone but this was discontinued secondary to disease progression. The patient was started on treatment with systemic chemotherapy with Carfilzomib, Cytoxan and dexamethasone as salvage therapy. She is status post 3 cycles and tolerating this treatment fairly well. She is currently undergoing cycle #4 and she is here today for day 8 The previous myeloma panel performed in early March 2016 showed improvement in her disease. I recommended for the patient to continue her current treatment with Carfilzomib, Cytoxan and dexamethasone. She would come back for follow-up visit in 3 weeks for reevaluation with the start of cycle #4. She was given a refill of Xanax and oxycodone. She was advised to call immediately if she has any concerning symptoms in the interval. The patient voices understanding of current disease status and treatment options and is in agreement with the current care plan.  All questions were answered. The patient knows to call the clinic with any problems, questions or concerns. We can certainly see the patient much sooner if necessary.  Disclaimer: This note was dictated with voice recognition software. Similar sounding words can inadvertently be transcribed and may not be corrected upon review.

## 2014-12-05 NOTE — Patient Instructions (Signed)
Continue Coumadin 5 mg on Tues/Thurs/Sat and take 2.5 mg all other days.  Recheck PT/INR on 12/26/14; Lab at 1:45, [facility] at 2:15pm and Coumadin clinic at 2:45pm.

## 2014-12-05 NOTE — Telephone Encounter (Signed)
Per staff message and POF I have scheduled appts. Advised scheduler of appts. JMW  

## 2014-12-05 NOTE — Patient Instructions (Addendum)
[facility] Discharge Instructions for Patients Receiving Chemotherapy  Today you received the following chemotherapy agents: Cytoxan, Kyprolis, Zometa  To help prevent nausea and vomiting after your treatment, we encourage you to take your nausea medication as prescribed by your physician.   If you develop nausea and vomiting that is not controlled by your nausea medication, call the clinic.   BELOW ARE SYMPTOMS THAT SHOULD BE REPORTED IMMEDIATELY:  *FEVER GREATER THAN 100.5 F  *CHILLS WITH OR WITHOUT FEVER  NAUSEA AND VOMITING THAT IS NOT CONTROLLED WITH YOUR NAUSEA MEDICATION  *UNUSUAL SHORTNESS OF BREATH  *UNUSUAL BRUISING OR BLEEDING  TENDERNESS IN MOUTH AND THROAT WITH OR WITHOUT PRESENCE OF ULCERS  *URINARY PROBLEMS  *BOWEL PROBLEMS  UNUSUAL RASH Items with * indicate a potential emergency and should be followed up as soon as possible.  Feel free to call the clinic you have any questions or concerns. The clinic phone number is [phone].  Please show the [facility] at check-in to the Emergency Department and triage nurse.

## 2014-12-06 ENCOUNTER — Ambulatory Visit (HOSPITAL_BASED_OUTPATIENT_CLINIC_OR_DEPARTMENT_OTHER): Payer: Medicare Other

## 2014-12-06 DIAGNOSIS — C9000 Multiple myeloma not having achieved remission: Secondary | ICD-10-CM | POA: Diagnosis not present

## 2014-12-06 DIAGNOSIS — Z5112 Encounter for antineoplastic immunotherapy: Secondary | ICD-10-CM

## 2014-12-06 MED ORDER — SODIUM CHLORIDE 0.9 % IV SOLN
Freq: Once | INTRAVENOUS | Status: AC
  Administered 2014-12-06: 14:00:00 via INTRAVENOUS
  Filled 2014-12-06: qty 4

## 2014-12-06 MED ORDER — SODIUM CHLORIDE 0.9 % IV SOLN
Freq: Once | INTRAVENOUS | Status: AC
  Administered 2014-12-06: 14:00:00 via INTRAVENOUS

## 2014-12-06 MED ORDER — DEXTROSE 5 % IV SOLN
36.0000 mg/m2 | Freq: Once | INTRAVENOUS | Status: AC
  Administered 2014-12-06: 54 mg via INTRAVENOUS
  Filled 2014-12-06: qty 27

## 2014-12-06 NOTE — Patient Instructions (Signed)
[facility] Discharge Instructions for Patients Receiving Chemotherapy  Today you received the following chemotherapy agents Kyprolis  To help prevent nausea and vomiting after your treatment, we encourage you to take your nausea medication as directed/prescribed   If you develop nausea and vomiting that is not controlled by your nausea medication, call the clinic.   BELOW ARE SYMPTOMS THAT SHOULD BE REPORTED IMMEDIATELY:  *FEVER GREATER THAN 100.5 F  *CHILLS WITH OR WITHOUT FEVER  NAUSEA AND VOMITING THAT IS NOT CONTROLLED WITH YOUR NAUSEA MEDICATION  *UNUSUAL SHORTNESS OF BREATH  *UNUSUAL BRUISING OR BLEEDING  TENDERNESS IN MOUTH AND THROAT WITH OR WITHOUT PRESENCE OF ULCERS  *URINARY PROBLEMS  *BOWEL PROBLEMS  UNUSUAL RASH Items with * indicate a potential emergency and should be followed up as soon as possible.  Feel free to call the clinic you have any questions or concerns. The clinic phone number is [phone].  Please show the [facility] at check-in to the Emergency Department and triage nurse.

## 2014-12-08 ENCOUNTER — Other Ambulatory Visit: Payer: Self-pay

## 2014-12-08 DIAGNOSIS — Z1231 Encounter for screening mammogram for malignant neoplasm of breast: Secondary | ICD-10-CM

## 2014-12-12 ENCOUNTER — Ambulatory Visit: Payer: Medicare Other

## 2014-12-12 ENCOUNTER — Ambulatory Visit (HOSPITAL_BASED_OUTPATIENT_CLINIC_OR_DEPARTMENT_OTHER): Payer: Medicare Other

## 2014-12-12 ENCOUNTER — Other Ambulatory Visit (HOSPITAL_BASED_OUTPATIENT_CLINIC_OR_DEPARTMENT_OTHER): Payer: Medicare Other

## 2014-12-12 DIAGNOSIS — Z5112 Encounter for antineoplastic immunotherapy: Secondary | ICD-10-CM | POA: Diagnosis not present

## 2014-12-12 DIAGNOSIS — C9000 Multiple myeloma not having achieved remission: Secondary | ICD-10-CM

## 2014-12-12 LAB — CBC WITH DIFFERENTIAL/PLATELET
BASO%: 0.3 % (ref 0.0–2.0)
BASOS ABS: 0 10*3/uL (ref 0.0–0.1)
EOS%: 2.1 % (ref 0.0–7.0)
Eosinophils Absolute: 0.1 10*3/uL (ref 0.0–0.5)
HCT: 35.3 % (ref 34.8–46.6)
HGB: 11.1 g/dL — ABNORMAL LOW (ref 11.6–15.9)
LYMPH%: 5.6 % — ABNORMAL LOW (ref 14.0–49.7)
MCH: 31.2 pg (ref 25.1–34.0)
MCHC: 31.4 g/dL — AB (ref 31.5–36.0)
MCV: 99.2 fL (ref 79.5–101.0)
MONO#: 0.3 10*3/uL (ref 0.1–0.9)
MONO%: 8.2 % (ref 0.0–14.0)
NEUT#: 3.2 10*3/uL (ref 1.5–6.5)
NEUT%: 83.8 % — ABNORMAL HIGH (ref 38.4–76.8)
Platelets: 173 10*3/uL (ref 145–400)
RBC: 3.56 10*6/uL — ABNORMAL LOW (ref 3.70–5.45)
RDW: 16.1 % — AB (ref 11.2–14.5)
WBC: 3.8 10*3/uL — ABNORMAL LOW (ref 3.9–10.3)
lymph#: 0.2 10*3/uL — ABNORMAL LOW (ref 0.9–3.3)

## 2014-12-12 LAB — COMPREHENSIVE METABOLIC PANEL (CC13)
ALT: 13 U/L (ref 0–55)
ANION GAP: 11 meq/L (ref 3–11)
AST: 13 U/L (ref 5–34)
Albumin: 3.1 g/dL — ABNORMAL LOW (ref 3.5–5.0)
Alkaline Phosphatase: 60 U/L (ref 40–150)
BUN: 11.1 mg/dL (ref 7.0–26.0)
CO2: 26 meq/L (ref 22–29)
CREATININE: 0.8 mg/dL (ref 0.6–1.1)
Calcium: 9.4 mg/dL (ref 8.4–10.4)
Chloride: 104 mEq/L (ref 98–109)
EGFR: 79 mL/min/{1.73_m2} — ABNORMAL LOW (ref >90)
Glucose: 198 mg/dl — ABNORMAL HIGH (ref 70–140)
Potassium: 3.8 mEq/L (ref 3.5–5.1)
Sodium: 142 mEq/L (ref 136–145)
Total Bilirubin: 0.39 mg/dL (ref 0.20–1.20)
Total Protein: 5.6 g/dL — ABNORMAL LOW (ref 6.4–8.3)

## 2014-12-12 MED ORDER — SODIUM CHLORIDE 0.9 % IV SOLN
300.0000 mg/m2 | Freq: Once | INTRAVENOUS | Status: AC
  Administered 2014-12-12: 440 mg via INTRAVENOUS
  Filled 2014-12-12: qty 22

## 2014-12-12 MED ORDER — SODIUM CHLORIDE 0.9 % IV SOLN
Freq: Once | INTRAVENOUS | Status: AC
  Administered 2014-12-12: 12:00:00 via INTRAVENOUS

## 2014-12-12 MED ORDER — DEXTROSE 5 % IV SOLN: 36.0000 mg/m2 | Freq: Once | INTRAVENOUS | Status: DC

## 2014-12-12 MED ORDER — DEXTROSE 5 % IV SOLN
36.0000 mg/m2 | Freq: Once | INTRAVENOUS | Status: AC
  Administered 2014-12-12: 54 mg via INTRAVENOUS
  Filled 2014-12-12: qty 27

## 2014-12-12 MED ORDER — SODIUM CHLORIDE 0.9 % IV SOLN
Freq: Once | INTRAVENOUS | Status: AC
  Administered 2014-12-12: 12:00:00 via INTRAVENOUS
  Filled 2014-12-12: qty 4

## 2014-12-12 NOTE — Patient Instructions (Signed)
[facility] Discharge Instructions for Patients Receiving Chemotherapy  Today you received the following chemotherapy agents: Cytoxan and Kyprolis.   To help prevent nausea and vomiting after your treatment, we encourage you to take your nausea medication as directed.    If you develop nausea and vomiting that is not controlled by your nausea medication, call the clinic.   BELOW ARE SYMPTOMS THAT SHOULD BE REPORTED IMMEDIATELY:  *FEVER GREATER THAN 100.5 F  *CHILLS WITH OR WITHOUT FEVER  NAUSEA AND VOMITING THAT IS NOT CONTROLLED WITH YOUR NAUSEA MEDICATION  *UNUSUAL SHORTNESS OF BREATH  *UNUSUAL BRUISING OR BLEEDING  TENDERNESS IN MOUTH AND THROAT WITH OR WITHOUT PRESENCE OF ULCERS  *URINARY PROBLEMS  *BOWEL PROBLEMS  UNUSUAL RASH Items with * indicate a potential emergency and should be followed up as soon as possible.  Feel free to call the clinic you have any questions or concerns. The clinic phone number is [phone].  Please show the [facility] at check-in to the Emergency Department and triage nurse.

## 2014-12-13 ENCOUNTER — Ambulatory Visit (HOSPITAL_BASED_OUTPATIENT_CLINIC_OR_DEPARTMENT_OTHER): Payer: Medicare Other

## 2014-12-13 VITALS — BP 161/65 | HR 78 | Temp 97.8°F | Resp 18

## 2014-12-13 DIAGNOSIS — C9000 Multiple myeloma not having achieved remission: Secondary | ICD-10-CM | POA: Diagnosis not present

## 2014-12-13 DIAGNOSIS — Z5112 Encounter for antineoplastic immunotherapy: Secondary | ICD-10-CM | POA: Diagnosis not present

## 2014-12-13 MED ORDER — SODIUM CHLORIDE 0.9 % IV SOLN
Freq: Once | INTRAVENOUS | Status: AC
  Administered 2014-12-13: 13:00:00 via INTRAVENOUS
  Filled 2014-12-13: qty 4

## 2014-12-13 MED ORDER — SODIUM CHLORIDE 0.9 % IV SOLN
Freq: Once | INTRAVENOUS | Status: AC
  Administered 2014-12-13: 13:00:00 via INTRAVENOUS

## 2014-12-13 MED ORDER — DEXTROSE 5 % IV SOLN
36.0000 mg/m2 | Freq: Once | INTRAVENOUS | Status: AC
  Administered 2014-12-13: 54 mg via INTRAVENOUS
  Filled 2014-12-13: qty 27

## 2014-12-13 NOTE — Patient Instructions (Signed)
[facility] Discharge Instructions for Patients Receiving Chemotherapy  Today you received the following chemotherapy agents Kyprolis.   To help prevent nausea and vomiting after your treatment, we encourage you to take your nausea medication as directed.    If you develop nausea and vomiting that is not controlled by your nausea medication, call the clinic.   BELOW ARE SYMPTOMS THAT SHOULD BE REPORTED IMMEDIATELY:  *FEVER GREATER THAN 100.5 F  *CHILLS WITH OR WITHOUT FEVER  NAUSEA AND VOMITING THAT IS NOT CONTROLLED WITH YOUR NAUSEA MEDICATION  *UNUSUAL SHORTNESS OF BREATH  *UNUSUAL BRUISING OR BLEEDING  TENDERNESS IN MOUTH AND THROAT WITH OR WITHOUT PRESENCE OF ULCERS  *URINARY PROBLEMS  *BOWEL PROBLEMS  UNUSUAL RASH Items with * indicate a potential emergency and should be followed up as soon as possible.  Feel free to call the clinic you have any questions or concerns. The clinic phone number is [phone].  Please show the [facility] at check-in to the Emergency Department and triage nurse.

## 2014-12-15 ENCOUNTER — Telehealth: Payer: Self-pay | Admitting: Internal Medicine

## 2014-12-15 NOTE — Telephone Encounter (Signed)
s.w. pt daughter and confirmed appts

## 2014-12-18 ENCOUNTER — Telehealth: Payer: Self-pay | Admitting: *Deleted

## 2014-12-18 DIAGNOSIS — C9000 Multiple myeloma not having achieved remission: Secondary | ICD-10-CM

## 2014-12-18 MED ORDER — OXYCODONE HCL 5 MG PO TABS: 7.5000 mg | ORAL_TABLET | Freq: Four times a day (QID) | ORAL | Status: DC | PRN

## 2014-12-18 NOTE — Telephone Encounter (Signed)
Pt's daughter called with refill request on Oxycodone 5mg  IR. Pt will run out tomorrow. Will review with MD

## 2014-12-18 NOTE — Telephone Encounter (Signed)
Notified [name] pt's medication refill is ready for pick up. No further concerns at this time

## 2014-12-26 ENCOUNTER — Ambulatory Visit: Payer: Medicare Other | Admitting: Pharmacist

## 2014-12-26 ENCOUNTER — Telehealth: Payer: Self-pay | Admitting: Oncology

## 2014-12-26 ENCOUNTER — Ambulatory Visit (HOSPITAL_BASED_OUTPATIENT_CLINIC_OR_DEPARTMENT_OTHER): Payer: Medicare Other

## 2014-12-26 ENCOUNTER — Ambulatory Visit (HOSPITAL_BASED_OUTPATIENT_CLINIC_OR_DEPARTMENT_OTHER): Payer: Medicare Other | Admitting: Oncology

## 2014-12-26 ENCOUNTER — Encounter: Payer: Self-pay | Admitting: Oncology

## 2014-12-26 ENCOUNTER — Other Ambulatory Visit: Payer: Self-pay | Admitting: Oncology

## 2014-12-26 ENCOUNTER — Other Ambulatory Visit (HOSPITAL_BASED_OUTPATIENT_CLINIC_OR_DEPARTMENT_OTHER): Payer: Medicare Other

## 2014-12-26 VITALS — BP 159/65 | HR 94 | Temp 98.9°F | Resp 18 | Ht <= 58 in | Wt 117.0 lb

## 2014-12-26 DIAGNOSIS — Z7901 Long term (current) use of anticoagulants: Secondary | ICD-10-CM | POA: Diagnosis not present

## 2014-12-26 DIAGNOSIS — Z5112 Encounter for antineoplastic immunotherapy: Secondary | ICD-10-CM

## 2014-12-26 DIAGNOSIS — C9000 Multiple myeloma not having achieved remission: Secondary | ICD-10-CM

## 2014-12-26 LAB — COMPREHENSIVE METABOLIC PANEL (CC13)
ALK PHOS: 68 U/L (ref 40–150)
ALT: 18 U/L (ref 0–55)
ANION GAP: 13 meq/L — AB (ref 3–11)
AST: 15 U/L (ref 5–34)
Albumin: 3 g/dL — ABNORMAL LOW (ref 3.5–5.0)
BUN: 13.5 mg/dL (ref 7.0–26.0)
CHLORIDE: 101 meq/L (ref 98–109)
CO2: 29 mEq/L (ref 22–29)
CREATININE: 0.9 mg/dL (ref 0.6–1.1)
Calcium: 9.1 mg/dL (ref 8.4–10.4)
EGFR: 76 mL/min/{1.73_m2} — AB (ref >90)
Glucose: 223 mg/dl — ABNORMAL HIGH (ref 70–140)
POTASSIUM: 3.4 meq/L — AB (ref 3.5–5.1)
Sodium: 143 mEq/L (ref 136–145)
Total Bilirubin: 0.35 mg/dL (ref 0.20–1.20)
Total Protein: 5.4 g/dL — ABNORMAL LOW (ref 6.4–8.3)

## 2014-12-26 LAB — CBC WITH DIFFERENTIAL/PLATELET
BASO%: 0.2 % (ref 0.0–2.0)
Basophils Absolute: 0 10*3/uL (ref 0.0–0.1)
EOS ABS: 0.1 10*3/uL (ref 0.0–0.5)
EOS%: 1 % (ref 0.0–7.0)
HEMATOCRIT: 34.9 % (ref 34.8–46.6)
HGB: 11 g/dL — ABNORMAL LOW (ref 11.6–15.9)
LYMPH%: 3.9 % — ABNORMAL LOW (ref 14.0–49.7)
MCH: 30.9 pg (ref 25.1–34.0)
MCHC: 31.5 g/dL (ref 31.5–36.0)
MCV: 98 fL (ref 79.5–101.0)
MONO#: 0.5 10*3/uL (ref 0.1–0.9)
MONO%: 7.7 % (ref 0.0–14.0)
NEUT%: 87.2 % — ABNORMAL HIGH (ref 38.4–76.8)
NEUTROS ABS: 5.3 10*3/uL (ref 1.5–6.5)
PLATELETS: 262 10*3/uL (ref 145–400)
RBC: 3.56 10*6/uL — ABNORMAL LOW (ref 3.70–5.45)
RDW: 15.6 % — ABNORMAL HIGH (ref 11.2–14.5)
WBC: 6.1 10*3/uL (ref 3.9–10.3)
lymph#: 0.2 10*3/uL — ABNORMAL LOW (ref 0.9–3.3)

## 2014-12-26 LAB — POCT INR: INR: 2.6

## 2014-12-26 LAB — PROTIME-INR
INR: 2.6 (ref 2.00–3.50)
Protime: 31.2 Seconds — ABNORMAL HIGH (ref 10.6–13.4)

## 2014-12-26 MED ORDER — SODIUM CHLORIDE 0.9 % IV SOLN: Freq: Once | INTRAVENOUS | Status: DC

## 2014-12-26 MED ORDER — DEXTROSE 5 % IV SOLN
36.0000 mg/m2 | Freq: Once | INTRAVENOUS | Status: AC
  Administered 2014-12-26: 54 mg via INTRAVENOUS
  Filled 2014-12-26: qty 27

## 2014-12-26 MED ORDER — DEXAMETHASONE SODIUM PHOSPHATE 100 MG/10ML IJ SOLN
Freq: Once | INTRAMUSCULAR | Status: AC
  Administered 2014-12-26: 16:00:00 via INTRAVENOUS
  Filled 2014-12-26: qty 4

## 2014-12-26 MED ORDER — SODIUM CHLORIDE 0.9 % IV SOLN
Freq: Once | INTRAVENOUS | Status: AC
  Administered 2014-12-26: 15:00:00 via INTRAVENOUS

## 2014-12-26 MED ORDER — CYCLOPHOSPHAMIDE CHEMO INJECTION 1 GM
300.0000 mg/m2 | Freq: Once | INTRAMUSCULAR | Status: AC
  Administered 2014-12-26: 440 mg via INTRAVENOUS
  Filled 2014-12-26: qty 22

## 2014-12-26 NOTE — Telephone Encounter (Signed)
Gave avs & calendar for May/June. Sent message to schedule treatment.

## 2014-12-26 NOTE — Progress Notes (Signed)
INR at goal today at 2.6 (goal 2-3) Pt seen in infusion  Pt doing well with no complaints No unusual bleeding or bruising  No diet or medication changes No missed or extra doses Plan: No changes Plan: Continue Coumadin 5 mg on Tues/Thurs/Sat and take 2.5 mg all other days.  Recheck PT/INR on 01/09/15; Lab at 1:30, Infusion at 2:15pm and Coumadin clinic at 2:30pm.

## 2014-12-26 NOTE — Patient Instructions (Signed)
INR at goal No changes Continue Coumadin 5 mg on Tues/Thurs/Sat and take 2.5 mg all other days.  Recheck PT/INR on 01/09/15; Lab at 1:30, Infusion at 2:15pm and Coumadin clinic at 2:30pm.

## 2014-12-26 NOTE — Patient Instructions (Signed)
[facility] Discharge Instructions for Patients Receiving Chemotherapy  Today you received the following chemotherapy agents: cytoxan, kyprolis  To help prevent nausea and vomiting after your treatment, we encourage you to take your nausea medication.  Take it as often as prescribed.     If you develop nausea and vomiting that is not controlled by your nausea medication, call the clinic. If it is after clinic hours your family physician or the after hours number for the clinic or go to the Emergency Department.   BELOW ARE SYMPTOMS THAT SHOULD BE REPORTED IMMEDIATELY:  *FEVER GREATER THAN 100.5 F  *CHILLS WITH OR WITHOUT FEVER  NAUSEA AND VOMITING THAT IS NOT CONTROLLED WITH YOUR NAUSEA MEDICATION  *UNUSUAL SHORTNESS OF BREATH  *UNUSUAL BRUISING OR BLEEDING  TENDERNESS IN MOUTH AND THROAT WITH OR WITHOUT PRESENCE OF ULCERS  *URINARY PROBLEMS  *BOWEL PROBLEMS  UNUSUAL RASH Items with * indicate a potential emergency and should be followed up as soon as possible.  Feel free to call the clinic you have any questions or concerns. The clinic phone number is [phone].   I have been informed and understand all the instructions given to me. I know to contact the clinic, my physician, or go to the Emergency Department if any problems should occur. I do not have any questions at this time, but understand that I may call the clinic during office hours   should I have any questions or need assistance in obtaining follow up care.    __________________________________________  _____________  __________ Signature of Patient or Authorized Representative            Date                   Time    __________________________________________ Nurse's Signature    

## 2014-12-26 NOTE — Progress Notes (Signed)
[facility] [phone]   [phone]  OFFICE PROGRESS NOTE  Lindsay Stain, MD [place] [address]  DIAGNOSIS:  1. Multiple myeloma diagnosed in September 2009,  2. history of bilateral lower extremity deep vein thrombosis diagnosed in November 2009  3. acute on chronic deep vein thrombosis in the left lower extremity diagnosed in October 2010.   PRIOR THERAPY:  1. status post palliative radiotherapy to the right femur and ischail tuberosity, first was done in December 2011 and the second treatment was completed Jan 08 2011, and the third treatment to the right distal femur completed on 04/30/2011  2. status post palliative radiotherapy to the left proximal femur completed 02/28/2011.  3. Revlimid 25 mg by mouth daily for 21 days every 4 weeks in addition to oral Decadron at 40 mg by mouth on a weekly basis status post 40 cycles.  4. weekly subcutaneous Velcade 1.3 mg/M2 status post 60 cycles, discontinued today secondary to mild disease progression. 5. Pomalyst 4 mg by mouth daily for 21 days every 4 weeks, the patient started the first dose on 12/22/2012 , in addition to Decadron 40 mg weekly. Status post 13 cycles.  6. Chemotherapy according to the [facility] [id number] expanded access treatment protocol with Elotuzumab, lenalidomide and dexamthasone. Status post 5 cycles, discontinued today secondary to disease progression.   CURRENT THERAPY:  1. Systemic chemotherapy with Carfilzomib, Cytoxan and Decadron. First cycle 09/05/2014. Status post 4 cycles. 2. Coumadin followed by the Coumadin clinic at the [facility].  3. Zometa 4 mg IV given every 3 months.  INTERVAL HISTORY: [name] 79 y.o. female returns to the clinic today for follow-up visit accompanied by her daughter. The patient is tolerating her current treatment with Carfilzomib, Cytoxan and Decadron fairly well with no significant adverse effect fairly  well. She has no significant complaints today.. She denied having any significant weight loss or night sweats. She has no nausea or vomiting. The patient denied having any significant chest pain, shortness of breath, cough or hemoptysis. Pain is controlled with the use of Oxycodone about 3-4 times per day. She due to start cycle 5 today.   MEDICAL HISTORY: Past Medical History  Diagnosis Date  . Blood transfusion   . Depression     Mild  . History of chicken pox   . Hyperlipidemia   . Hypertension   . Degenerative joint disease   . Phlebitis   . Multiple myeloma     per Dr. Julien Nordmann, s/p palliative radiation for leg pain 2011 per Dr. Sondra Come  . Deep vein thrombosis of bilateral lower extremities   . Family history of anesthesia complication     DAUGHTER CPR AFTER  . Cardiomyopathy   . Diabetes mellitus     Steroid related  . UTI (urinary tract infection) 09/08/2013  . History of radiation therapy 08/30/13-09/20/13    20 gray to lower lumbar/upper sacrum    ALLERGIES:  has No Known Allergies.  MEDICATIONS:  Current Outpatient Prescriptions  Medication Sig Dispense Refill  . acyclovir (ZOVIRAX) 400 MG tablet Take 1 tablet (400 mg total) by mouth 2 (two) times daily. 60 tablet 0  . ALPRAZolam (XANAX) 0.5 MG tablet Take 1 tablet (0.5 mg total) by mouth 2 (two) times daily as needed for anxiety. 60 tablet 0  . Alum & Mag Hydroxide-Simeth (MAGIC MOUTHWASH W/LIDOCAINE) SOLN Take 5 mLs by mouth 3 (three) times daily as needed for mouth pain (  do not swallow, swish and spit 48ml TID PRN). 120 mL 0  . Calcium Carbonate-Vitamin D 600-400 MG-UNIT per tablet Take 1 tablet by mouth 3 (three) times daily with meals.      Marland Kitchen dexamethasone (DECADRON) 4 MG tablet Take 10 tab (40 mg) weekly except on days when you receive elotuzumab. On those days take 7 tab (28 mg) between 3 & 24 hours prior to the start of elotuzumab.    Marland Kitchen glipiZIDE (GLUCOTROL XL) 2.5 MG 24 hr tablet Take 2.5 mg by mouth daily with  breakfast.    . ibuprofen (ADVIL) 200 MG tablet Take 400 mg by mouth every 6 (six) hours as needed for headache or moderate pain.    Marland Kitchen lisinopril-hydrochlorothiazide (PRINZIDE,ZESTORETIC) 20-12.5 MG per tablet TAKE ONE-HALF TABLET BY MOUTH ONCE DAILY 45 tablet 1  . loperamide (IMODIUM A-D) 2 MG tablet Take 1 tablet (2 mg total) by mouth 4 (four) times daily as needed for diarrhea or loose stools.    Marland Kitchen oxyCODONE (OXY IR/ROXICODONE) 5 MG immediate release tablet Take 1.5 tablets (7.5 mg total) by mouth every 6 (six) hours as needed for severe pain. 90 tablet 0  . potassium chloride SA (K-DUR,KLOR-CON) 20 MEQ tablet Take 2 tablets (40 mEq total) by mouth daily. X 7 days 7 tablet 0  . prochlorperazine (COMPAZINE) 10 MG tablet Take 1 tablet (10 mg total) by mouth every 6 (six) hours as needed. 30 tablet 0  . temazepam (RESTORIL) 30 MG capsule TAKE ONE CAPSULE BY MOUTH AT BEDTIME AS NEEDED 30 capsule 0  . warfarin (COUMADIN) 5 MG tablet TAKE ONE TABLET BY MOUTH AS DIRECTED 60 tablet 0   No current facility-administered medications for this visit.    SURGICAL HISTORY:  Past Surgical History  Procedure Laterality Date  . Abdominal hysterectomy    . Ankle fracture surgery Left     REVIEW OF SYSTEMS:  Constitutional: positive for fatigue Eyes: negative Ears, nose, mouth, throat, and face: negative Respiratory: negative Cardiovascular: negative Gastrointestinal: negative Genitourinary:negative Integument/breast: negative Hematologic/lymphatic: negative Musculoskeletal:positive for bone pain Neurological: negative Behavioral/Psych: negative Endocrine: negative Allergic/Immunologic: negative   PHYSICAL EXAMINATION: General appearance: alert, cooperative and no distress Head: Normocephalic, without obvious abnormality, atraumatic Neck: no adenopathy, no JVD, supple, symmetrical, trachea midline and thyroid not enlarged, symmetric, no tenderness/mass/nodules Lymph nodes: Cervical,  supraclavicular, and axillary nodes normal. Resp: clear to auscultation bilaterally Back: symmetric, no curvature. ROM normal. No CVA tenderness. Cardio: regular rate and rhythm, S1, S2 normal, no murmur, click, rub or gallop GI: soft, non-tender; bowel sounds normal; no masses,  no organomegaly Extremities: extremities normal, atraumatic, no cyanosis or edema Neurologic: Alert and oriented X 3, normal strength and tone. Normal symmetric reflexes. Normal coordination and gait  ECOG PERFORMANCE STATUS: 1 - Symptomatic but completely ambulatory  Blood pressure 159/65, pulse 94, temperature 98.9 F (37.2 C), temperature source Oral, resp. rate 18, height 4\' 10"  (1.473 m), weight 117 lb (53.071 kg), SpO2 99 %.  LABORATORY DATA: Lab Results  Component Value Date   WBC 6.1 12/26/2014   HGB 11.0* 12/26/2014   HCT 34.9 12/26/2014   MCV 98.0 12/26/2014   PLT 262 12/26/2014      Chemistry      Component Value Date/Time   NA 143 12/26/2014 1410   NA 139 09/07/2014 1624   K 3.4* 12/26/2014 1410   K 4.0 09/07/2014 1624   CL 109 09/07/2014 1624   CL 107 02/14/2013 1311   CO2 29 12/26/2014 1410  CO2 23 09/07/2014 1624   BUN 13.5 12/26/2014 1410   BUN 28* 09/07/2014 1624   CREATININE 0.9 12/26/2014 1410   CREATININE 1.12* 09/07/2014 1624      Component Value Date/Time   CALCIUM 9.1 12/26/2014 1410   CALCIUM 8.8 09/07/2014 1624   CALCIUM * 06/13/2008 1605    5.7 CRITICAL RESULT CALLED TO, READ BACK BY AND VERIFIED WITH: JAMIE TRACY,RN [id number] @ 2202 BY J SCOTTON (NOTE)  Amended report. Result repeated and verified. CORRECTED ON 10/14 AT 1429: PREVIOUSLY REPORTED AS Result repeated and verified.   ALKPHOS 68 12/26/2014 1410   ALKPHOS 49 09/19/2014 1447   AST 15 12/26/2014 1410   AST 18 09/19/2014 1447   ALT 18 12/26/2014 1410   ALT 24 09/19/2014 1447   BILITOT 0.35 12/26/2014 1410   BILITOT 0.4 09/19/2014 1447      RADIOGRAPHIC STUDIES: No results found.  ASSESSMENT AND  PLAN: This is a very pleasant 79 year old African-American female with history of multiple myeloma status post several chemotherapy regimens lastly including systemic chemotherapy according to the [facility] CA 204143 clinical trial with Elotuzumab, Revlimid and dexamethasone. She status post 5 cycles and tolerating her treatment fairly well. This was discontinued secondary to disease progression. Unfortunately the recent myeloma panel showed significant increase in the free lambda light chain. I discussed the lab result with the patient and her daughters. She completed treatment with Elotuzumab, Revlimid and dexamethasone but this was discontinued secondary to disease progression. The patient was started on treatment with systemic chemotherapy with Carfilzomib, Cytoxan and dexamethasone as salvage therapy. She is status post 4 cycles and tolerating this treatment fairly well. She is scheduled to begin cycle 5 today. She will proceed as scheduled.  The previous myeloma panel performed in early March 2016 showed improvement in her disease. I recommended for the patient to continue her current treatment with Carfilzomib, Cytoxan and dexamethasone. She would come back for follow-up visit in 4 weeks for reevaluation with the start of cycle #5.  She was advised to call immediately if she has any concerning symptoms in the interval. The patient voices understanding of current disease status and treatment options and is in agreement with the current care plan.  All questions were answered. The patient knows to call the clinic with any problems, questions or concerns. We can certainly see the patient much sooner if necessary.  Mikey Bussing, DNP, AGPCNP-BC, AOCNP

## 2014-12-27 ENCOUNTER — Ambulatory Visit (HOSPITAL_BASED_OUTPATIENT_CLINIC_OR_DEPARTMENT_OTHER): Payer: Medicare Other

## 2014-12-27 ENCOUNTER — Ambulatory Visit: Payer: Medicare Other

## 2014-12-27 DIAGNOSIS — Z5112 Encounter for antineoplastic immunotherapy: Secondary | ICD-10-CM | POA: Diagnosis not present

## 2014-12-27 DIAGNOSIS — C9000 Multiple myeloma not having achieved remission: Secondary | ICD-10-CM

## 2014-12-27 MED ORDER — SODIUM CHLORIDE 0.9 % IV SOLN
Freq: Once | INTRAVENOUS | Status: AC
  Administered 2014-12-27: 16:00:00 via INTRAVENOUS

## 2014-12-27 MED ORDER — SODIUM CHLORIDE 0.9 % IV SOLN
Freq: Once | INTRAVENOUS | Status: AC
  Administered 2014-12-27: 17:00:00 via INTRAVENOUS

## 2014-12-27 MED ORDER — SODIUM CHLORIDE 0.9 % IV SOLN
Freq: Once | INTRAVENOUS | Status: AC
  Administered 2014-12-27: 16:00:00 via INTRAVENOUS
  Filled 2014-12-27: qty 4

## 2014-12-27 MED ORDER — DEXTROSE 5 % IV SOLN
36.0000 mg/m2 | Freq: Once | INTRAVENOUS | Status: AC
  Administered 2014-12-27: 54 mg via INTRAVENOUS
  Filled 2014-12-27: qty 27

## 2014-12-27 NOTE — Patient Instructions (Signed)
[facility] Discharge Instructions for Patients Receiving Chemotherapy  Today you received the following chemotherapy agents: kyprolis  To help prevent nausea and vomiting after your treatment, we encourage you to take your nausea medication.  Take it as often as prescribed.     If you develop nausea and vomiting that is not controlled by your nausea medication, call the clinic. If it is after clinic hours your family physician or the after hours number for the clinic or go to the Emergency Department.   BELOW ARE SYMPTOMS THAT SHOULD BE REPORTED IMMEDIATELY:  *FEVER GREATER THAN 100.5 F  *CHILLS WITH OR WITHOUT FEVER  NAUSEA AND VOMITING THAT IS NOT CONTROLLED WITH YOUR NAUSEA MEDICATION  *UNUSUAL SHORTNESS OF BREATH  *UNUSUAL BRUISING OR BLEEDING  TENDERNESS IN MOUTH AND THROAT WITH OR WITHOUT PRESENCE OF ULCERS  *URINARY PROBLEMS  *BOWEL PROBLEMS  UNUSUAL RASH Items with * indicate a potential emergency and should be followed up as soon as possible.  Feel free to call the clinic you have any questions or concerns. The clinic phone number is [phone].   I have been informed and understand all the instructions given to me. I know to contact the clinic, my physician, or go to the Emergency Department if any problems should occur. I do not have any questions at this time, but understand that I may call the clinic during office hours   should I have any questions or need assistance in obtaining follow up care.    __________________________________________  _____________  __________ Signature of Patient or Authorized Representative            Date                   Time    __________________________________________ Nurse's Signature    

## 2015-01-01 ENCOUNTER — Other Ambulatory Visit: Payer: Self-pay | Admitting: Internal Medicine

## 2015-01-01 DIAGNOSIS — C9000 Multiple myeloma not having achieved remission: Secondary | ICD-10-CM

## 2015-01-02 ENCOUNTER — Ambulatory Visit (HOSPITAL_BASED_OUTPATIENT_CLINIC_OR_DEPARTMENT_OTHER): Payer: Medicare Other

## 2015-01-02 ENCOUNTER — Other Ambulatory Visit (HOSPITAL_BASED_OUTPATIENT_CLINIC_OR_DEPARTMENT_OTHER): Payer: Medicare Other

## 2015-01-02 DIAGNOSIS — Z7901 Long term (current) use of anticoagulants: Secondary | ICD-10-CM | POA: Diagnosis not present

## 2015-01-02 DIAGNOSIS — C9000 Multiple myeloma not having achieved remission: Secondary | ICD-10-CM | POA: Diagnosis not present

## 2015-01-02 DIAGNOSIS — Z5112 Encounter for antineoplastic immunotherapy: Secondary | ICD-10-CM

## 2015-01-02 LAB — CBC WITH DIFFERENTIAL/PLATELET
BASO%: 0.2 % (ref 0.0–2.0)
Basophils Absolute: 0 10*3/uL (ref 0.0–0.1)
EOS ABS: 0.1 10*3/uL (ref 0.0–0.5)
EOS%: 1.2 % (ref 0.0–7.0)
HEMATOCRIT: 33.6 % — AB (ref 34.8–46.6)
HGB: 10.6 g/dL — ABNORMAL LOW (ref 11.6–15.9)
LYMPH%: 1.8 % — AB (ref 14.0–49.7)
MCH: 29.9 pg (ref 25.1–34.0)
MCHC: 31.7 g/dL (ref 31.5–36.0)
MCV: 94.3 fL (ref 79.5–101.0)
MONO#: 0.4 10*3/uL (ref 0.1–0.9)
MONO%: 6.2 % (ref 0.0–14.0)
NEUT%: 90.6 % — ABNORMAL HIGH (ref 38.4–76.8)
NEUTROS ABS: 6.3 10*3/uL (ref 1.5–6.5)
Platelets: 123 10*3/uL — ABNORMAL LOW (ref 145–400)
RBC: 3.56 10*6/uL — AB (ref 3.70–5.45)
RDW: 17.4 % — AB (ref 11.2–14.5)
WBC: 7 10*3/uL (ref 3.9–10.3)
lymph#: 0.1 10*3/uL — ABNORMAL LOW (ref 0.9–3.3)

## 2015-01-02 LAB — COMPREHENSIVE METABOLIC PANEL (CC13)
ALBUMIN: 3.1 g/dL — AB (ref 3.5–5.0)
ALT: 14 U/L (ref 0–55)
ANION GAP: 12 meq/L — AB (ref 3–11)
AST: 14 U/L (ref 5–34)
Alkaline Phosphatase: 66 U/L (ref 40–150)
BUN: 15.4 mg/dL (ref 7.0–26.0)
CALCIUM: 9 mg/dL (ref 8.4–10.4)
CO2: 25 meq/L (ref 22–29)
Chloride: 104 mEq/L (ref 98–109)
Creatinine: 0.7 mg/dL (ref 0.6–1.1)
GLUCOSE: 139 mg/dL (ref 70–140)
POTASSIUM: 3.1 meq/L — AB (ref 3.5–5.1)
Sodium: 141 mEq/L (ref 136–145)
Total Bilirubin: 0.39 mg/dL (ref 0.20–1.20)
Total Protein: 5.6 g/dL — ABNORMAL LOW (ref 6.4–8.3)

## 2015-01-02 LAB — PROTIME-INR
INR: 2.7 (ref 2.00–3.50)
PROTIME: 32.4 s — AB (ref 10.6–13.4)

## 2015-01-02 MED ORDER — SODIUM CHLORIDE 0.9 % IV SOLN
Freq: Once | INTRAVENOUS | Status: AC
  Administered 2015-01-02: 15:00:00 via INTRAVENOUS
  Filled 2015-01-02: qty 4

## 2015-01-02 MED ORDER — DEXTROSE 5 % IV SOLN
36.0000 mg/m2 | Freq: Once | INTRAVENOUS | Status: AC
  Administered 2015-01-02: 54 mg via INTRAVENOUS
  Filled 2015-01-02: qty 27

## 2015-01-02 MED ORDER — SODIUM CHLORIDE 0.9 % IV SOLN
Freq: Once | INTRAVENOUS | Status: AC
  Administered 2015-01-02: 15:00:00 via INTRAVENOUS

## 2015-01-02 MED ORDER — SODIUM CHLORIDE 0.9 % IV SOLN
300.0000 mg/m2 | Freq: Once | INTRAVENOUS | Status: AC
  Administered 2015-01-02: 440 mg via INTRAVENOUS
  Filled 2015-01-02: qty 22

## 2015-01-02 NOTE — Patient Instructions (Addendum)
[facility] Discharge Instructions for Patients Receiving Chemotherapy  Today you received the following chemotherapy agents: Cytoxan, Kyprolis  To help prevent nausea and vomiting after your treatment, we encourage you to take your nausea medication as prescribed by your physician.   If you develop nausea and vomiting that is not controlled by your nausea medication, call the clinic.   BELOW ARE SYMPTOMS THAT SHOULD BE REPORTED IMMEDIATELY:  *FEVER GREATER THAN 100.5 F  *CHILLS WITH OR WITHOUT FEVER  NAUSEA AND VOMITING THAT IS NOT CONTROLLED WITH YOUR NAUSEA MEDICATION  *UNUSUAL SHORTNESS OF BREATH  *UNUSUAL BRUISING OR BLEEDING  TENDERNESS IN MOUTH AND THROAT WITH OR WITHOUT PRESENCE OF ULCERS  *URINARY PROBLEMS  *BOWEL PROBLEMS  UNUSUAL RASH Items with * indicate a potential emergency and should be followed up as soon as possible.  Feel free to call the clinic you have any questions or concerns. The clinic phone number is [phone].  Please show the [facility] at check-in to the Emergency Department and triage nurse.

## 2015-01-03 ENCOUNTER — Ambulatory Visit (HOSPITAL_BASED_OUTPATIENT_CLINIC_OR_DEPARTMENT_OTHER): Payer: Medicare Other

## 2015-01-03 ENCOUNTER — Other Ambulatory Visit: Payer: Self-pay | Admitting: *Deleted

## 2015-01-03 VITALS — BP 157/61 | HR 79 | Temp 97.2°F | Resp 18

## 2015-01-03 DIAGNOSIS — Z5112 Encounter for antineoplastic immunotherapy: Secondary | ICD-10-CM | POA: Diagnosis not present

## 2015-01-03 DIAGNOSIS — C9000 Multiple myeloma not having achieved remission: Secondary | ICD-10-CM

## 2015-01-03 MED ORDER — SODIUM CHLORIDE 0.9 % IV SOLN: Freq: Once | INTRAVENOUS | Status: DC

## 2015-01-03 MED ORDER — DEXTROSE 5 % IV SOLN
36.0000 mg/m2 | Freq: Once | INTRAVENOUS | Status: AC
  Administered 2015-01-03: 54 mg via INTRAVENOUS
  Filled 2015-01-03: qty 27

## 2015-01-03 MED ORDER — OXYCODONE HCL 5 MG PO TABS: 7.5000 mg | ORAL_TABLET | Freq: Four times a day (QID) | ORAL | Status: DC | PRN

## 2015-01-03 MED ORDER — ALPRAZOLAM 0.5 MG PO TABS: 0.5000 mg | ORAL_TABLET | Freq: Two times a day (BID) | ORAL | Status: DC | PRN

## 2015-01-03 MED ORDER — SODIUM CHLORIDE 0.9 % IV SOLN
Freq: Once | INTRAVENOUS | Status: DC
  Filled 2015-01-03: qty 4

## 2015-01-03 NOTE — Telephone Encounter (Signed)
Call from infusion rn pt requesting refill on xanax and oxycodone. Review with MD, ok to refill

## 2015-01-04 ENCOUNTER — Telehealth: Payer: Self-pay

## 2015-01-04 ENCOUNTER — Telehealth: Payer: Self-pay | Admitting: Internal Medicine

## 2015-01-04 MED ORDER — LISINOPRIL-HYDROCHLOROTHIAZIDE 20-12.5 MG PO TABS: 1.0000 | ORAL_TABLET | Freq: Every day | ORAL | Status: DC

## 2015-01-04 NOTE — Telephone Encounter (Signed)
[name] pts daughter left v/m; pt had been taking lisinopril HCTZ 20-12.5 mg taking 1/2 pill daily; when pt would go for chemo treatment BP was high and pt was advised to increase to taking 1 pill daily; now BP is doing better. Pt is out of med. Does pt need to be seen for med adjustment. [name] wants to know if can look in pts chart for oncology BP readings. [name] request cb. Pt last saw Dr [name] 07/06/14. No future appt scheduled.

## 2015-01-04 NOTE — Telephone Encounter (Signed)
[name] advised.

## 2015-01-04 NOTE — Telephone Encounter (Signed)
Okay to continue 1 tab a day.  rx sent.  Last set of labs were okay and if BP is controlled, then continue as is.  If lightheaded and BP is low, then can cut back to 1/2 tab a day later on.  Thanks.

## 2015-01-04 NOTE — Telephone Encounter (Signed)
[name] called back and request cb go to [facility] [phone].

## 2015-01-04 NOTE — Telephone Encounter (Signed)
pt dtr called to move tx to later time....pt ok and aware of new time

## 2015-01-09 ENCOUNTER — Other Ambulatory Visit (HOSPITAL_BASED_OUTPATIENT_CLINIC_OR_DEPARTMENT_OTHER): Payer: Medicare Other

## 2015-01-09 ENCOUNTER — Ambulatory Visit (HOSPITAL_BASED_OUTPATIENT_CLINIC_OR_DEPARTMENT_OTHER): Payer: Medicare Other

## 2015-01-09 ENCOUNTER — Ambulatory Visit (HOSPITAL_BASED_OUTPATIENT_CLINIC_OR_DEPARTMENT_OTHER): Payer: Self-pay | Admitting: Pharmacist

## 2015-01-09 ENCOUNTER — Ambulatory Visit: Payer: Medicare Other | Admitting: Internal Medicine

## 2015-01-09 VITALS — BP 135/58 | HR 71 | Temp 97.0°F | Resp 19

## 2015-01-09 DIAGNOSIS — C9000 Multiple myeloma not having achieved remission: Secondary | ICD-10-CM | POA: Diagnosis not present

## 2015-01-09 DIAGNOSIS — I82402 Acute embolism and thrombosis of unspecified deep veins of left lower extremity: Secondary | ICD-10-CM

## 2015-01-09 DIAGNOSIS — Z5112 Encounter for antineoplastic immunotherapy: Secondary | ICD-10-CM

## 2015-01-09 DIAGNOSIS — Z7901 Long term (current) use of anticoagulants: Secondary | ICD-10-CM

## 2015-01-09 LAB — COMPREHENSIVE METABOLIC PANEL (CC13)
ALT: 16 U/L (ref 0–55)
ANION GAP: 12 meq/L — AB (ref 3–11)
AST: 15 U/L (ref 5–34)
Albumin: 3 g/dL — ABNORMAL LOW (ref 3.5–5.0)
Alkaline Phosphatase: 71 U/L (ref 40–150)
BILIRUBIN TOTAL: 0.67 mg/dL (ref 0.20–1.20)
BUN: 13.6 mg/dL (ref 7.0–26.0)
CALCIUM: 8.8 mg/dL (ref 8.4–10.4)
CO2: 31 meq/L — AB (ref 22–29)
CREATININE: 0.8 mg/dL (ref 0.6–1.1)
Chloride: 97 mEq/L — ABNORMAL LOW (ref 98–109)
EGFR: 82 mL/min/{1.73_m2} — AB (ref >90)
GLUCOSE: 144 mg/dL — AB (ref 70–140)
Potassium: 3.2 mEq/L — ABNORMAL LOW (ref 3.5–5.1)
Sodium: 140 mEq/L (ref 136–145)
Total Protein: 5.6 g/dL — ABNORMAL LOW (ref 6.4–8.3)

## 2015-01-09 LAB — POCT INR: INR: 2.3

## 2015-01-09 LAB — CBC WITH DIFFERENTIAL/PLATELET
BASO%: 0.2 % (ref 0.0–2.0)
Basophils Absolute: 0 10*3/uL (ref 0.0–0.1)
EOS%: 1.6 % (ref 0.0–7.0)
Eosinophils Absolute: 0.1 10*3/uL (ref 0.0–0.5)
HCT: 32.9 % — ABNORMAL LOW (ref 34.8–46.6)
HGB: 10.6 g/dL — ABNORMAL LOW (ref 11.6–15.9)
LYMPH%: 2.3 % — ABNORMAL LOW (ref 14.0–49.7)
MCH: 30.5 pg (ref 25.1–34.0)
MCHC: 32.2 g/dL (ref 31.5–36.0)
MCV: 94.8 fL (ref 79.5–101.0)
MONO#: 0.3 10*3/uL (ref 0.1–0.9)
MONO%: 7.1 % (ref 0.0–14.0)
NEUT%: 88.8 % — ABNORMAL HIGH (ref 38.4–76.8)
NEUTROS ABS: 4.3 10*3/uL (ref 1.5–6.5)
PLATELETS: 177 10*3/uL (ref 145–400)
RBC: 3.47 10*6/uL — AB (ref 3.70–5.45)
RDW: 17 % — AB (ref 11.2–14.5)
WBC: 4.8 10*3/uL (ref 3.9–10.3)
lymph#: 0.1 10*3/uL — ABNORMAL LOW (ref 0.9–3.3)

## 2015-01-09 LAB — PROTIME-INR
INR: 2.3 (ref 2.00–3.50)
PROTIME: 27.6 s — AB (ref 10.6–13.4)

## 2015-01-09 MED ORDER — SODIUM CHLORIDE 0.9 % IV SOLN
Freq: Once | INTRAVENOUS | Status: AC
  Administered 2015-01-09: 14:00:00 via INTRAVENOUS

## 2015-01-09 MED ORDER — SODIUM CHLORIDE 0.9 % IV SOLN
300.0000 mg/m2 | Freq: Once | INTRAVENOUS | Status: AC
  Administered 2015-01-09: 440 mg via INTRAVENOUS
  Filled 2015-01-09: qty 22

## 2015-01-09 MED ORDER — DEXTROSE 5 % IV SOLN
36.0000 mg/m2 | Freq: Once | INTRAVENOUS | Status: AC
  Administered 2015-01-09: 54 mg via INTRAVENOUS
  Filled 2015-01-09: qty 27

## 2015-01-09 MED ORDER — SODIUM CHLORIDE 0.9 % IV SOLN
Freq: Once | INTRAVENOUS | Status: AC
  Administered 2015-01-09: 15:00:00 via INTRAVENOUS
  Filled 2015-01-09: qty 4

## 2015-01-09 NOTE — Progress Notes (Signed)
INR = 2.3    Goal 2-3 INR within goal range. Patient seen in the infusion area during treatment. No complications of anticoagulation noted. She will continue Coumadin 5 mg on Tues/Thurs/Sat and take 2.5 mg all other days.  We will recheck PT/INR on 01/23/15; Lab at 1:45, [facility] at 2:15pm, and treatment at 3pm. We will see her in the treatment area.  Theone Murdoch, PharmD

## 2015-01-09 NOTE — Patient Instructions (Signed)
[facility] Discharge Instructions for Patients Receiving Chemotherapy  Today you received the following chemotherapy agents: Cytoxan, Kyprolis  To help prevent nausea and vomiting after your treatment, we encourage you to take your nausea medication as prescribed by your physician.   If you develop nausea and vomiting that is not controlled by your nausea medication, call the clinic.   BELOW ARE SYMPTOMS THAT SHOULD BE REPORTED IMMEDIATELY:  *FEVER GREATER THAN 100.5 F  *CHILLS WITH OR WITHOUT FEVER  NAUSEA AND VOMITING THAT IS NOT CONTROLLED WITH YOUR NAUSEA MEDICATION  *UNUSUAL SHORTNESS OF BREATH  *UNUSUAL BRUISING OR BLEEDING  TENDERNESS IN MOUTH AND THROAT WITH OR WITHOUT PRESENCE OF ULCERS  *URINARY PROBLEMS  *BOWEL PROBLEMS  UNUSUAL RASH Items with * indicate a potential emergency and should be followed up as soon as possible.  Feel free to call the clinic you have any questions or concerns. The clinic phone number is [phone].  Please show the [facility] at check-in to the Emergency Department and triage nurse.

## 2015-01-10 ENCOUNTER — Ambulatory Visit: Payer: Medicare Other

## 2015-01-10 ENCOUNTER — Ambulatory Visit (HOSPITAL_BASED_OUTPATIENT_CLINIC_OR_DEPARTMENT_OTHER): Payer: Medicare Other

## 2015-01-10 VITALS — BP 144/64 | HR 76 | Temp 98.7°F | Resp 18

## 2015-01-10 DIAGNOSIS — C9000 Multiple myeloma not having achieved remission: Secondary | ICD-10-CM | POA: Diagnosis not present

## 2015-01-10 DIAGNOSIS — Z5112 Encounter for antineoplastic immunotherapy: Secondary | ICD-10-CM

## 2015-01-10 MED ORDER — SODIUM CHLORIDE 0.9 % IV SOLN
Freq: Once | INTRAVENOUS | Status: AC
  Administered 2015-01-10: 16:00:00 via INTRAVENOUS
  Filled 2015-01-10: qty 4

## 2015-01-10 MED ORDER — CARFILZOMIB CHEMO INJECTION 60 MG
36.0000 mg/m2 | Freq: Once | INTRAVENOUS | Status: AC
  Administered 2015-01-10: 54 mg via INTRAVENOUS
  Filled 2015-01-10: qty 27

## 2015-01-10 MED ORDER — SODIUM CHLORIDE 0.9 % IV SOLN
Freq: Once | INTRAVENOUS | Status: AC
  Administered 2015-01-10: 16:00:00 via INTRAVENOUS

## 2015-01-10 NOTE — Patient Instructions (Signed)
[facility] Discharge Instructions for Patients Receiving Chemotherapy  Today you received the following chemotherapy agents: Kyprolis.   To help prevent nausea and vomiting after your treatment, we encourage you to take your nausea medication: Compazine. Take one every 6 hours as needed.   If you develop nausea and vomiting that is not controlled by your nausea medication, call the clinic.   BELOW ARE SYMPTOMS THAT SHOULD BE REPORTED IMMEDIATELY:  *FEVER GREATER THAN 100.5 F  *CHILLS WITH OR WITHOUT FEVER  NAUSEA AND VOMITING THAT IS NOT CONTROLLED WITH YOUR NAUSEA MEDICATION  *UNUSUAL SHORTNESS OF BREATH  *UNUSUAL BRUISING OR BLEEDING  TENDERNESS IN MOUTH AND THROAT WITH OR WITHOUT PRESENCE OF ULCERS  *URINARY PROBLEMS  *BOWEL PROBLEMS  UNUSUAL RASH Items with * indicate a potential emergency and should be followed up as soon as possible.  Feel free to call the clinic should you have any questions or concerns. The clinic phone number is [phone].  Please show the [facility] at check-in to the Emergency Department and triage nurse.

## 2015-01-12 ENCOUNTER — Ambulatory Visit: Payer: Medicare Other

## 2015-01-12 ENCOUNTER — Ambulatory Visit
Admission: RE | Admit: 2015-01-12 | Discharge: 2015-01-12 | Disposition: A | Discharge status: Home / Self Care | Payer: Medicare Other | Source: Ambulatory Visit

## 2015-01-12 DIAGNOSIS — Z1231 Encounter for screening mammogram for malignant neoplasm of breast: Secondary | ICD-10-CM

## 2015-01-19 ENCOUNTER — Telehealth: Payer: Self-pay | Admitting: *Deleted

## 2015-01-19 ENCOUNTER — Other Ambulatory Visit: Payer: Self-pay | Admitting: *Deleted

## 2015-01-19 DIAGNOSIS — C9000 Multiple myeloma not having achieved remission: Secondary | ICD-10-CM

## 2015-01-19 MED ORDER — OXYCODONE HCL 5 MG PO TABS: 7.5000 mg | ORAL_TABLET | Freq: Four times a day (QID) | ORAL | Status: DC | PRN

## 2015-01-19 NOTE — Telephone Encounter (Signed)
This RN called daughter and left a message informing her that her mom's prescription was ready for pick up.

## 2015-01-19 NOTE — Telephone Encounter (Signed)
VM from pt's daughter requesting refill of pt's oxycodone 5 mg 1.5 tablets every 6 hours as needed for pain. Last filled on 01/03/15  #90

## 2015-01-19 NOTE — Telephone Encounter (Signed)
OK to refill it.

## 2015-01-23 ENCOUNTER — Encounter: Payer: Self-pay | Admitting: Physician Assistant

## 2015-01-23 ENCOUNTER — Ambulatory Visit (HOSPITAL_BASED_OUTPATIENT_CLINIC_OR_DEPARTMENT_OTHER): Payer: Medicare Other | Admitting: Physician Assistant

## 2015-01-23 ENCOUNTER — Ambulatory Visit (HOSPITAL_BASED_OUTPATIENT_CLINIC_OR_DEPARTMENT_OTHER): Payer: Medicare Other

## 2015-01-23 ENCOUNTER — Other Ambulatory Visit (HOSPITAL_BASED_OUTPATIENT_CLINIC_OR_DEPARTMENT_OTHER): Payer: Medicare Other

## 2015-01-23 ENCOUNTER — Ambulatory Visit (HOSPITAL_BASED_OUTPATIENT_CLINIC_OR_DEPARTMENT_OTHER): Payer: Medicare Other | Admitting: Pharmacist

## 2015-01-23 ENCOUNTER — Telehealth: Payer: Self-pay | Admitting: Internal Medicine

## 2015-01-23 VITALS — BP 132/57 | HR 72 | Temp 98.1°F | Resp 18 | Ht <= 58 in | Wt 112.7 lb

## 2015-01-23 DIAGNOSIS — Z5111 Encounter for antineoplastic chemotherapy: Secondary | ICD-10-CM | POA: Diagnosis not present

## 2015-01-23 DIAGNOSIS — C9000 Multiple myeloma not having achieved remission: Secondary | ICD-10-CM

## 2015-01-23 DIAGNOSIS — Z5112 Encounter for antineoplastic immunotherapy: Secondary | ICD-10-CM | POA: Diagnosis not present

## 2015-01-23 DIAGNOSIS — R52 Pain, unspecified: Secondary | ICD-10-CM | POA: Diagnosis not present

## 2015-01-23 DIAGNOSIS — I82402 Acute embolism and thrombosis of unspecified deep veins of left lower extremity: Secondary | ICD-10-CM

## 2015-01-23 DIAGNOSIS — Z7901 Long term (current) use of anticoagulants: Secondary | ICD-10-CM

## 2015-01-23 LAB — CBC WITH DIFFERENTIAL/PLATELET
BASO%: 0.2 % (ref 0.0–2.0)
Basophils Absolute: 0 10*3/uL (ref 0.0–0.1)
EOS%: 0.9 % (ref 0.0–7.0)
Eosinophils Absolute: 0.1 10*3/uL (ref 0.0–0.5)
HCT: 37.2 % (ref 34.8–46.6)
HGB: 11.9 g/dL (ref 11.6–15.9)
LYMPH%: 4.3 % — ABNORMAL LOW (ref 14.0–49.7)
MCH: 30.7 pg (ref 25.1–34.0)
MCHC: 32 g/dL (ref 31.5–36.0)
MCV: 96.1 fL (ref 79.5–101.0)
MONO#: 0.7 10*3/uL (ref 0.1–0.9)
MONO%: 12.9 % (ref 0.0–14.0)
NEUT%: 81.7 % — ABNORMAL HIGH (ref 38.4–76.8)
NEUTROS ABS: 4.4 10*3/uL (ref 1.5–6.5)
NRBC: 0 % (ref 0–0)
Platelets: 258 10*3/uL (ref 145–400)
RBC: 3.87 10*6/uL (ref 3.70–5.45)
RDW: 15.5 % — AB (ref 11.2–14.5)
WBC: 5.3 10*3/uL (ref 3.9–10.3)
lymph#: 0.2 10*3/uL — ABNORMAL LOW (ref 0.9–3.3)

## 2015-01-23 LAB — COMPREHENSIVE METABOLIC PANEL (CC13)
ALK PHOS: 66 U/L (ref 40–150)
ALT: 12 U/L (ref 0–55)
AST: 15 U/L (ref 5–34)
Albumin: 3.3 g/dL — ABNORMAL LOW (ref 3.5–5.0)
Anion Gap: 13 mEq/L — ABNORMAL HIGH (ref 3–11)
BILIRUBIN TOTAL: 0.31 mg/dL (ref 0.20–1.20)
BUN: 21.3 mg/dL (ref 7.0–26.0)
CO2: 26 meq/L (ref 22–29)
Calcium: 9 mg/dL (ref 8.4–10.4)
Chloride: 102 mEq/L (ref 98–109)
Creatinine: 0.9 mg/dL (ref 0.6–1.1)
EGFR: 74 mL/min/{1.73_m2} — ABNORMAL LOW (ref >90)
GLUCOSE: 187 mg/dL — AB (ref 70–140)
Potassium: 3 mEq/L — CL (ref 3.5–5.1)
SODIUM: 141 meq/L (ref 136–145)
TOTAL PROTEIN: 5.8 g/dL — AB (ref 6.4–8.3)

## 2015-01-23 LAB — PROTIME-INR
INR: 2 (ref 2.00–3.50)
PROTIME: 24 s — AB (ref 10.6–13.4)

## 2015-01-23 LAB — POCT INR: INR: 2

## 2015-01-23 MED ORDER — SODIUM CHLORIDE 0.9 % IV SOLN
300.0000 mg/m2 | Freq: Once | INTRAVENOUS | Status: AC
  Administered 2015-01-23: 440 mg via INTRAVENOUS
  Filled 2015-01-23: qty 22

## 2015-01-23 MED ORDER — OXYCODONE HCL 5 MG PO TABS: ORAL_TABLET | ORAL | Status: DC

## 2015-01-23 MED ORDER — SODIUM CHLORIDE 0.9 % IV SOLN
Freq: Once | INTRAVENOUS | Status: AC
  Administered 2015-01-23: 16:00:00 via INTRAVENOUS
  Filled 2015-01-23: qty 4

## 2015-01-23 MED ORDER — SODIUM CHLORIDE 0.9 % IV SOLN
Freq: Once | INTRAVENOUS | Status: AC
  Administered 2015-01-23: 16:00:00 via INTRAVENOUS

## 2015-01-23 MED ORDER — POTASSIUM CHLORIDE CRYS ER 20 MEQ PO TBCR: 20.0000 meq | EXTENDED_RELEASE_TABLET | Freq: Every day | ORAL | Status: DC

## 2015-01-23 MED ORDER — SODIUM CHLORIDE 0.9 % IV SOLN: Freq: Once | INTRAVENOUS | Status: DC

## 2015-01-23 MED ORDER — DEXTROSE 5 % IV SOLN
36.0000 mg/m2 | Freq: Once | INTRAVENOUS | Status: AC
  Administered 2015-01-23: 54 mg via INTRAVENOUS
  Filled 2015-01-23: qty 27

## 2015-01-23 NOTE — Progress Notes (Signed)
Adrena, PA aware of potassium 3.0, okay to proceed with treatment, RX sent to patient's pharmacy, pt aware.

## 2015-01-23 NOTE — Patient Instructions (Signed)
INR at goal No changes Continue Coumadin 5 mg on Tues/Thurs/Sat and take 2.5 mg all other days.  Recheck PT/INR on 02/06/15; Lab at 1:45, treatment at 2:30pm.  We will see you in the treatment area.

## 2015-01-23 NOTE — Telephone Encounter (Signed)
Gave dtr avs report and appointment schedule for May thru July.

## 2015-01-23 NOTE — Progress Notes (Signed)
INR at goal today at 2.0 (goal 2-3) Pt is doing well with no complaints Pt seen in infusion area No missed or extra doses No diet or medication changes No unusual bleeding or bruising Plan: Continue Coumadin 5 mg on Tues/Thurs/Sat and take 2.5 mg all other days.  Recheck PT/INR on 02/06/15; Lab at 1:45, treatment at 2:30pm.  We will see you in the treatment area.

## 2015-01-23 NOTE — Progress Notes (Addendum)
[facility] [phone]   [phone]  OFFICE PROGRESS NOTE  [name], MD [place] [address]  DIAGNOSIS:  1. Multiple myeloma diagnosed in September 2009,  2. history of bilateral lower extremity deep vein thrombosis diagnosed in November 2009  3. acute on chronic deep vein thrombosis in the left lower extremity diagnosed in October 2010.   PRIOR THERAPY:  1. status post palliative radiotherapy to the right femur and ischail tuberosity, first was done in December 2011 and the second treatment was completed Jan 08 2011, and the third treatment to the right distal femur completed on 04/30/2011  2. status post palliative radiotherapy to the left proximal femur completed 02/28/2011.  3. Revlimid 25 mg by mouth daily for 21 days every 4 weeks in addition to oral Decadron at 40 mg by mouth on a weekly basis status post 40 cycles.  4. weekly subcutaneous Velcade 1.3 mg/M2 status post 60 cycles, discontinued today secondary to mild disease progression. 5. Pomalyst 4 mg by mouth daily for 21 days every 4 weeks, the patient started the first dose on 12/22/2012 , in addition to Decadron 40 mg weekly. Status post 13 cycles.  6. Chemotherapy according to the [facility] [id number] expanded access treatment protocol with Elotuzumab, lenalidomide and dexamthasone. Status post 5 cycles, discontinued today secondary to disease progression.   CURRENT THERAPY:  1. Systemic chemotherapy with Carfilzomib, Cytoxan and Decadron. First cycle 09/05/2014. Status post 5 cycles. 2. Coumadin followed by the Coumadin clinic at the [facility].  3. Zometa 4 mg IV given every 3 months.  INTERVAL HISTORY: [name] 79 y.o. female returns to the clinic today for follow-up visit accompanied by her daughters. The patient is tolerating her current treatment with Carfilzomib, Cytoxan and Decadron fairly well with no significant adverse effect.  She  complains of increased pain. She is inquiring about a long acting pain medication. Her daughter gave her one dose of 10 mg oxycodone instead of her 7.5 mg. This increased dose was helpful.  She denied having any significant weight loss or night sweats. She has no nausea or vomiting. The patient denied having any significant chest pain, shortness of breath, cough or hemoptysis.  She due to start cycle 6 today.   MEDICAL HISTORY: Past Medical History  Diagnosis Date  . Blood transfusion   . Depression     Mild  . History of chicken pox   . Hyperlipidemia   . Hypertension   . Degenerative joint disease   . Phlebitis   . Multiple myeloma     per Dr. Julien Nordmann, s/p palliative radiation for leg pain 2011 per Dr. Sondra Come  . Deep vein thrombosis of bilateral lower extremities   . Family history of anesthesia complication     DAUGHTER CPR AFTER  . Cardiomyopathy   . Diabetes mellitus     Steroid related  . UTI (urinary tract infection) 09/08/2013  . History of radiation therapy 08/30/13-09/20/13    20 gray to lower lumbar/upper sacrum    ALLERGIES:  has No Known Allergies.  MEDICATIONS:  Current Outpatient Prescriptions  Medication Sig Dispense Refill  . acyclovir (ZOVIRAX) 400 MG tablet Take 1 tablet (400 mg total) by mouth 2 (two) times daily. 60 tablet 0  . ALPRAZolam (XANAX) 0.5 MG tablet Take 1 tablet (0.5 mg total) by mouth 2 (two) times daily as needed for anxiety. 60 tablet 0  . Alum & Mag Hydroxide-Simeth (MAGIC MOUTHWASH  W/LIDOCAINE) SOLN Take 5 mLs by mouth 3 (three) times daily as needed for mouth pain (do not swallow, swish and spit 86ml TID PRN). 120 mL 0  . Calcium Carbonate-Vitamin D 600-400 MG-UNIT per tablet Take 1 tablet by mouth 3 (three) times daily with meals.      Marland Kitchen dexamethasone (DECADRON) 4 MG tablet Take 10 tab (40 mg) weekly except on days when you receive elotuzumab. On those days take 7 tab (28 mg) between 3 & 24 hours prior to the start of elotuzumab.    Marland Kitchen  glipiZIDE (GLUCOTROL XL) 2.5 MG 24 hr tablet Take 2.5 mg by mouth daily with breakfast.    . ibuprofen (ADVIL) 200 MG tablet Take 400 mg by mouth every 6 (six) hours as needed for headache or moderate pain.    Marland Kitchen lisinopril-hydrochlorothiazide (PRINZIDE,ZESTORETIC) 20-12.5 MG per tablet Take 1 tablet by mouth daily. 90 tablet 3  . loperamide (IMODIUM A-D) 2 MG tablet Take 1 tablet (2 mg total) by mouth 4 (four) times daily as needed for diarrhea or loose stools.    Marland Kitchen oxyCODONE (OXY IR/ROXICODONE) 5 MG immediate release tablet Take 1.5 tablets (7.$RemoveBefore'5mg'maYTzzgwSxryy$  total) to 2 tablets ( 10 mg total) by mouth every 6 hours as needed for severe pain 90 tablet 0  . potassium chloride SA (K-DUR,KLOR-CON) 20 MEQ tablet Take 2 tablets (40 mEq total) by mouth daily. X 7 days 7 tablet 0  . prochlorperazine (COMPAZINE) 10 MG tablet Take 1 tablet (10 mg total) by mouth every 6 (six) hours as needed. 30 tablet 0  . temazepam (RESTORIL) 30 MG capsule TAKE ONE CAPSULE BY MOUTH AT BEDTIME AS NEEDED 30 capsule 0  . warfarin (COUMADIN) 5 MG tablet TAKE ONE TABLET BY MOUTH AS DIRECTED 60 tablet 0   No current facility-administered medications for this visit.   Facility-Administered Medications Ordered in Other Visits  Medication Dose Route Frequency Provider Last Rate Last Dose  . 0.9 %  sodium chloride infusion   Intravenous Once Curt Bears, MD      . carfilzomib (KYPROLIS) 54 mg in dextrose 5 % 100 mL chemo infusion  36 mg/m2 (Treatment Plan Actual) Intravenous Once Curt Bears, MD 254 mL/hr at 01/23/15 1614 54 mg at 01/23/15 1614  . cyclophosphamide (CYTOXAN) 440 mg in sodium chloride 0.9 % 250 mL chemo infusion  300 mg/m2 (Treatment Plan Actual) Intravenous Once Curt Bears, MD        SURGICAL HISTORY:  Past Surgical History  Procedure Laterality Date  . Abdominal hysterectomy    . Ankle fracture surgery Left     REVIEW OF SYSTEMS:  Constitutional: positive for fatigue Eyes: negative Ears, nose, mouth,  throat, and face: negative Respiratory: negative Cardiovascular: negative Gastrointestinal: negative Genitourinary:negative Integument/breast: negative Hematologic/lymphatic: negative Musculoskeletal:positive for bone pain and increased reently Neurological: negative Behavioral/Psych: negative Endocrine: negative Allergic/Immunologic: negative   PHYSICAL EXAMINATION: General appearance: alert, cooperative and no distress Head: Normocephalic, without obvious abnormality, atraumatic Neck: no adenopathy, no JVD, supple, symmetrical, trachea midline and thyroid not enlarged, symmetric, no tenderness/mass/nodules Lymph nodes: Cervical, supraclavicular, and axillary nodes normal. Resp: clear to auscultation bilaterally Back: symmetric, no curvature. ROM normal. No CVA tenderness. Cardio: regular rate and rhythm, S1, S2 normal, no murmur, click, rub or gallop GI: soft, non-tender; bowel sounds normal; no masses,  no organomegaly Extremities: extremities normal, atraumatic, no cyanosis or edema Neurologic: Alert and oriented X 3, normal strength and tone. Normal symmetric reflexes. Normal coordination and gait  ECOG PERFORMANCE STATUS: 1 - Symptomatic  but completely ambulatory  Blood pressure 132/57, pulse 72, temperature 98.1 F (36.7 C), temperature source Oral, resp. rate 18, height $RemoveBe'4\' 10"'CfUkHFnDS$  (1.473 m), weight 112 lb 11.2 oz (51.12 kg), SpO2 100 %.  LABORATORY DATA: Lab Results  Component Value Date   WBC 5.3 01/23/2015   HGB 11.9 01/23/2015   HCT 37.2 01/23/2015   MCV 96.1 01/23/2015   PLT 258 01/23/2015      Chemistry      Component Value Date/Time   NA 141 01/23/2015 1417   NA 139 09/07/2014 1624   K 3.0* 01/23/2015 1417   K 4.0 09/07/2014 1624   CL 109 09/07/2014 1624   CL 107 02/14/2013 1311   CO2 26 01/23/2015 1417   CO2 23 09/07/2014 1624   BUN 21.3 01/23/2015 1417   BUN 28* 09/07/2014 1624   CREATININE 0.9 01/23/2015 1417   CREATININE 1.12* 09/07/2014 1624        Component Value Date/Time   CALCIUM 9.0 01/23/2015 1417   CALCIUM 8.8 09/07/2014 1624   CALCIUM * 06/13/2008 1605    5.7 CRITICAL RESULT CALLED TO, READ BACK BY AND VERIFIED WITH: JAMIE TRACY,RN [id number] @ [address] (NOTE)  Amended report. Result repeated and verified. CORRECTED ON 10/14 AT 1429: PREVIOUSLY REPORTED AS Result repeated and verified.   ALKPHOS 66 01/23/2015 1417   ALKPHOS 49 09/19/2014 1447   AST 15 01/23/2015 1417   AST 18 09/19/2014 1447   ALT 12 01/23/2015 1417   ALT 24 09/19/2014 1447   BILITOT 0.31 01/23/2015 1417   BILITOT 0.4 09/19/2014 1447      RADIOGRAPHIC STUDIES: Mm Digital Screening Bilateral  01/15/2015   CLINICAL DATA:  Screening.  EXAM: DIGITAL SCREENING BILATERAL MAMMOGRAM WITH CAD  COMPARISON:  Previous exam(s).  ACR Breast Density Category b: There are scattered areas of fibroglandular density.  FINDINGS: There are no findings suspicious for malignancy. Images were processed with CAD.  IMPRESSION: No mammographic evidence of malignancy. A result letter of this screening mammogram will be mailed directly to the patient.  RECOMMENDATION: Screening mammogram in one year. (Code:SM-B-01Y)  BI-RADS CATEGORY  1: Negative.   Electronically Signed   By: Conchita Paris M.D.   On: 01/15/2015 12:22    ASSESSMENT AND PLAN: This is a very pleasant 79 year old African-American female with history of multiple myeloma status post several chemotherapy regimens lastly including systemic chemotherapy according to the [facility] [place] 204143 clinical trial with Elotuzumab, Revlimid and dexamethasone. She status post 5 cycles and tolerating her treatment fairly well. This was discontinued secondary to disease progression. Unfortunately the  myeloma panel showed significant increase in the free lambda light chain. She completed treatment with Elotuzumab, Revlimid and dexamethasone but this was discontinued secondary to disease progression. The patient was started on treatment with  systemic chemotherapy with Carfilzomib, Cytoxan and dexamethasone as salvage therapy. She is status post 5 cycles and tolerating this treatment fairly well. She is scheduled to begin cycle 6 today. The patient was discussed with and also seen by Dr. Julien Nordmann. She will proceed as scheduled. [name] did not tolerate long acting pain medications in the past- excessive respiratory depression. She will continue on oxycodone but may alternate 7.5 mg and 10 mg doses as needed. She is given a refill prescription for her oxycodone reflecting the new instructions. The previous myeloma panel performed in early March 2016 showed improvement in her disease. She will follow up in one month with repeat myeloma panel to re-evaluate her  disease.  She was advised to call immediately if she has any concerning symptoms in the interval. The patient voices understanding of current disease status and treatment options and is in agreement with the current care plan.  All questions were answered. The patient knows to call the clinic with any problems, questions or concerns. We can certainly see the patient much sooner if necessary.  Carlton Adam, PA-C 01/23/2015  ADDENDUM: Hematology/Oncology Attending: I had a face to face encounter with the patient. I recommended her care plan.  This is a very pleasant 79 years old African-American female with multiple myeloma diagnosed in September 2009 and currently on systemic chemotherapy with Carfilzomib, Cytoxan and Decadron status post 5 cycles. The patient is tolerating her treatment well with no significant adverse effects. I recommended for her to proceed with cycle #6 today as a scheduled. For pain management the patient will continue on oxycodone as needed. She will come back for follow-up visit in one month's for reevaluation after repeating myeloma panel. The patient was advised to call immediately if she has any concerning symptoms in the interval.  Disclaimer:  This note was dictated with voice recognition software. Similar sounding words can inadvertently be transcribed and may be missed upon review. Eilleen Kempf., MD 01/29/2015

## 2015-01-23 NOTE — Patient Instructions (Signed)
[facility] Discharge Instructions for Patients Receiving Chemotherapy  Today you received the following chemotherapy agents Cytoxan and Kyprolis  To help prevent nausea and vomiting after your treatment, we encourage you to take your nausea medication as directed   If you develop nausea and vomiting that is not controlled by your nausea medication, call the clinic.   BELOW ARE SYMPTOMS THAT SHOULD BE REPORTED IMMEDIATELY:  *FEVER GREATER THAN 100.5 F  *CHILLS WITH OR WITHOUT FEVER  NAUSEA AND VOMITING THAT IS NOT CONTROLLED WITH YOUR NAUSEA MEDICATION  *UNUSUAL SHORTNESS OF BREATH  *UNUSUAL BRUISING OR BLEEDING  TENDERNESS IN MOUTH AND THROAT WITH OR WITHOUT PRESENCE OF ULCERS  *URINARY PROBLEMS  *BOWEL PROBLEMS  UNUSUAL RASH Items with * indicate a potential emergency and should be followed up as soon as possible.  Feel free to call the clinic you have any questions or concerns. The clinic phone number is [phone].  Please show the CHEMO ALERT CARD at check-in to the Emergency Department and triage nurse.   

## 2015-01-24 ENCOUNTER — Ambulatory Visit (HOSPITAL_BASED_OUTPATIENT_CLINIC_OR_DEPARTMENT_OTHER): Payer: Medicare Other

## 2015-01-24 ENCOUNTER — Telehealth: Payer: Self-pay

## 2015-01-24 VITALS — BP 117/53 | HR 68 | Temp 98.3°F | Resp 18

## 2015-01-24 DIAGNOSIS — C9000 Multiple myeloma not having achieved remission: Secondary | ICD-10-CM | POA: Diagnosis not present

## 2015-01-24 DIAGNOSIS — Z5112 Encounter for antineoplastic immunotherapy: Secondary | ICD-10-CM

## 2015-01-24 MED ORDER — SODIUM CHLORIDE 0.9 % IV SOLN
Freq: Once | INTRAVENOUS | Status: AC
  Administered 2015-01-24: 16:00:00 via INTRAVENOUS
  Filled 2015-01-24: qty 4

## 2015-01-24 MED ORDER — SODIUM CHLORIDE 0.9 % IV SOLN
Freq: Once | INTRAVENOUS | Status: AC
  Administered 2015-01-24: 15:00:00 via INTRAVENOUS

## 2015-01-24 MED ORDER — DEXTROSE 5 % IV SOLN: 36.0000 mg/m2 | Freq: Once | INTRAVENOUS | Status: DC

## 2015-01-24 MED ORDER — DEXTROSE 5 % IV SOLN
36.0000 mg/m2 | Freq: Once | INTRAVENOUS | Status: AC
  Administered 2015-01-24: 54 mg via INTRAVENOUS
  Filled 2015-01-24: qty 27

## 2015-01-24 NOTE — Progress Notes (Signed)
Upon arrival IV accessed and reports "I do not want hydration today."

## 2015-01-24 NOTE — Progress Notes (Signed)
Discharged at 1635, ambulatory with walker.  Daughter assisted her today.

## 2015-01-24 NOTE — Telephone Encounter (Signed)
Fax from [facility] clarifying potassium RX. The correct is kdur 20 meq, #10. Take 1 tab po daily for 10 days. This was left as voice message.

## 2015-01-24 NOTE — Patient Instructions (Signed)
[facility] Discharge Instructions for Patients Receiving Chemotherapy  Today you received the following chemotherapy agents Kyprolis.  To help prevent nausea and vomiting after your treatment, we encourage you to take your nausea medication Compazine 10 mg as ordered by Dr. Julien Nordmann.   If you develop nausea and vomiting that is not controlled by your nausea medication, call the clinic.   BELOW ARE SYMPTOMS THAT SHOULD BE REPORTED IMMEDIATELY:  *FEVER GREATER THAN 100.5 F  *CHILLS WITH OR WITHOUT FEVER  NAUSEA AND VOMITING THAT IS NOT CONTROLLED WITH YOUR NAUSEA MEDICATION  *UNUSUAL SHORTNESS OF BREATH  *UNUSUAL BRUISING OR BLEEDING  TENDERNESS IN MOUTH AND THROAT WITH OR WITHOUT PRESENCE OF ULCERS  *URINARY PROBLEMS  *BOWEL PROBLEMS  UNUSUAL RASH Items with * indicate a potential emergency and should be followed up as soon as possible.  Feel free to call the clinic you have any questions or concerns. The clinic phone number is [phone].  Please show the [place] at check-in to the Emergency Department and triage nurse.

## 2015-01-27 NOTE — Patient Instructions (Signed)
Continue chemotherapy as scheduled Follow up in one month Take 7.5 mg to 10 mg as prescribed for pain

## 2015-01-30 ENCOUNTER — Other Ambulatory Visit: Payer: Self-pay | Admitting: Physician Assistant

## 2015-01-30 ENCOUNTER — Ambulatory Visit (HOSPITAL_BASED_OUTPATIENT_CLINIC_OR_DEPARTMENT_OTHER): Payer: Medicare Other

## 2015-01-30 ENCOUNTER — Other Ambulatory Visit (HOSPITAL_BASED_OUTPATIENT_CLINIC_OR_DEPARTMENT_OTHER): Payer: Medicare Other

## 2015-01-30 VITALS — BP 103/52 | HR 81 | Temp 97.3°F | Resp 18

## 2015-01-30 DIAGNOSIS — C9000 Multiple myeloma not having achieved remission: Secondary | ICD-10-CM | POA: Diagnosis not present

## 2015-01-30 DIAGNOSIS — Z5112 Encounter for antineoplastic immunotherapy: Secondary | ICD-10-CM

## 2015-01-30 LAB — COMPREHENSIVE METABOLIC PANEL (CC13)
ALT: 14 U/L (ref 0–55)
AST: 17 U/L (ref 5–34)
Albumin: 3.2 g/dL — ABNORMAL LOW (ref 3.5–5.0)
Alkaline Phosphatase: 63 U/L (ref 40–150)
Anion Gap: 12 mEq/L — ABNORMAL HIGH (ref 3–11)
BILIRUBIN TOTAL: 0.47 mg/dL (ref 0.20–1.20)
BUN: 15.6 mg/dL (ref 7.0–26.0)
CO2: 28 meq/L (ref 22–29)
Calcium: 9.1 mg/dL (ref 8.4–10.4)
Chloride: 103 mEq/L (ref 98–109)
Creatinine: 1 mg/dL (ref 0.6–1.1)
EGFR: 62 mL/min/{1.73_m2} — AB (ref >90)
GLUCOSE: 172 mg/dL — AB (ref 70–140)
Potassium: 3.2 mEq/L — ABNORMAL LOW (ref 3.5–5.1)
Sodium: 143 mEq/L (ref 136–145)
TOTAL PROTEIN: 5.9 g/dL — AB (ref 6.4–8.3)

## 2015-01-30 LAB — CBC WITH DIFFERENTIAL/PLATELET
BASO%: 0.4 % (ref 0.0–2.0)
Basophils Absolute: 0 10*3/uL (ref 0.0–0.1)
EOS%: 1.1 % (ref 0.0–7.0)
Eosinophils Absolute: 0.1 10*3/uL (ref 0.0–0.5)
HCT: 38.3 % (ref 34.8–46.6)
HGB: 12.4 g/dL (ref 11.6–15.9)
LYMPH#: 0.3 10*3/uL — AB (ref 0.9–3.3)
LYMPH%: 6.9 % — AB (ref 14.0–49.7)
MCH: 30.6 pg (ref 25.1–34.0)
MCHC: 32.3 g/dL (ref 31.5–36.0)
MCV: 94.7 fL (ref 79.5–101.0)
MONO#: 0.5 10*3/uL (ref 0.1–0.9)
MONO%: 11.4 % (ref 0.0–14.0)
NEUT#: 3.8 10*3/uL (ref 1.5–6.5)
NEUT%: 80.2 % — ABNORMAL HIGH (ref 38.4–76.8)
Platelets: 153 10*3/uL (ref 145–400)
RBC: 4.04 10*6/uL (ref 3.70–5.45)
RDW: 17.2 % — AB (ref 11.2–14.5)
WBC: 4.7 10*3/uL (ref 3.9–10.3)

## 2015-01-30 MED ORDER — SODIUM CHLORIDE 0.9 % IV SOLN
Freq: Once | INTRAVENOUS | Status: AC
  Administered 2015-01-30: 15:00:00 via INTRAVENOUS

## 2015-01-30 MED ORDER — CARFILZOMIB CHEMO INJECTION 60 MG
36.0000 mg/m2 | Freq: Once | INTRAVENOUS | Status: AC
  Administered 2015-01-30: 54 mg via INTRAVENOUS
  Filled 2015-01-30: qty 27

## 2015-01-30 MED ORDER — CYCLOPHOSPHAMIDE CHEMO INJECTION 1 GM
300.0000 mg/m2 | Freq: Once | INTRAMUSCULAR | Status: AC
  Administered 2015-01-30: 440 mg via INTRAVENOUS
  Filled 2015-01-30: qty 22

## 2015-01-30 MED ORDER — SODIUM CHLORIDE 0.9 % IV SOLN
Freq: Once | INTRAVENOUS | Status: AC
  Administered 2015-01-30: 15:00:00 via INTRAVENOUS
  Filled 2015-01-30: qty 4

## 2015-01-30 MED ORDER — OXYCODONE HCL 5 MG PO TABS: ORAL_TABLET | ORAL | Status: DC

## 2015-01-30 NOTE — Patient Instructions (Signed)
[facility] Discharge Instructions for Patients Receiving Chemotherapy  Today you received the following chemotherapy agents: Cytoxan, Kyprolis   To help prevent nausea and vomiting after your treatment, we encourage you to take your nausea medication as directed.    If you develop nausea and vomiting that is not controlled by your nausea medication, call the clinic.   BELOW ARE SYMPTOMS THAT SHOULD BE REPORTED IMMEDIATELY:  *FEVER GREATER THAN 100.5 F  *CHILLS WITH OR WITHOUT FEVER  NAUSEA AND VOMITING THAT IS NOT CONTROLLED WITH YOUR NAUSEA MEDICATION  *UNUSUAL SHORTNESS OF BREATH  *UNUSUAL BRUISING OR BLEEDING  TENDERNESS IN MOUTH AND THROAT WITH OR WITHOUT PRESENCE OF ULCERS  *URINARY PROBLEMS  *BOWEL PROBLEMS  UNUSUAL RASH Items with * indicate a potential emergency and should be followed up as soon as possible.  Feel free to call the clinic you have any questions or concerns. The clinic phone number is [phone].  Please show the CHEMO ALERT CARD at check-in to the Emergency Department and triage nurse.   

## 2015-01-31 ENCOUNTER — Ambulatory Visit (HOSPITAL_BASED_OUTPATIENT_CLINIC_OR_DEPARTMENT_OTHER): Payer: Medicare Other

## 2015-01-31 VITALS — BP 136/66 | HR 67 | Temp 98.3°F | Resp 16

## 2015-01-31 DIAGNOSIS — Z5112 Encounter for antineoplastic immunotherapy: Secondary | ICD-10-CM | POA: Diagnosis not present

## 2015-01-31 DIAGNOSIS — C9000 Multiple myeloma not having achieved remission: Secondary | ICD-10-CM | POA: Diagnosis not present

## 2015-01-31 MED ORDER — SODIUM CHLORIDE 0.9 % IV SOLN
Freq: Once | INTRAVENOUS | Status: AC
  Administered 2015-01-31: 16:00:00 via INTRAVENOUS
  Filled 2015-01-31: qty 4

## 2015-01-31 MED ORDER — DEXTROSE 5 % IV SOLN
36.0000 mg/m2 | Freq: Once | INTRAVENOUS | Status: AC
  Administered 2015-01-31: 54 mg via INTRAVENOUS
  Filled 2015-01-31: qty 27

## 2015-01-31 MED ORDER — SODIUM CHLORIDE 0.9 % IV SOLN
Freq: Once | INTRAVENOUS | Status: AC
  Administered 2015-01-31: 15:00:00 via INTRAVENOUS

## 2015-01-31 MED ORDER — SODIUM CHLORIDE 0.9 % IV SOLN: Freq: Once | INTRAVENOUS | Status: DC

## 2015-01-31 NOTE — Patient Instructions (Signed)
Carfilzomib injection What is this medicine? CARFILZOMIB (kar FILZ oh mib) is a chemotherapy drug that works by slowing or stopping cancer cell growth. This medicine is used to treat multiple myeloma. This medicine may be used for other purposes; ask your health care provider or pharmacist if you have questions. COMMON BRAND NAME(S): KYPROLIS What should I tell my health care provider before I take this medicine? They need to know if you have any of these conditions: -heart disease -irregular heartbeat -liver disease -lung or breathing disease -an unusual or allergic reaction to carfilzomib, or other medicines, foods, dyes, or preservatives -pregnant or trying to get pregnant -breast-feeding How should I use this medicine? This medicine is for injection or infusion into a vein. It is given by a health care professional in a hospital or clinic setting. Talk to your pediatrician regarding the use of this medicine in children. Special care may be needed. Overdosage: If you think you've taken too much of this medicine contact a poison control center or emergency room at once. Overdosage: If you think you have taken too much of this medicine contact a poison control center or emergency room at once. NOTE: This medicine is only for you. Do not share this medicine with others. What if I miss a dose? It is important not to miss your dose. Call your doctor or health care professional if you are unable to keep an appointment. What may interact with this medicine? Interactions are not expected. Give your health care provider a list of all the medicines, herbs, non-prescription drugs, or dietary supplements you use. Also tell them if you smoke, drink alcohol, or use illegal drugs. Some items may interact with your medicine. This list may not describe all possible interactions. Give your health care provider a list of all the medicines, herbs, non-prescription drugs, or dietary supplements you use. Also  tell them if you smoke, drink alcohol, or use illegal drugs. Some items may interact with your medicine. What should I watch for while using this medicine? Your condition will be monitored carefully while you are receiving this medicine. Report any side effects. Continue your course of treatment even though you feel ill unless your doctor tells you to stop. Call your doctor or health care professional for advice if you get a fever, chills or sore throat, or other symptoms of a cold or flu. Do not treat yourself. Try to avoid being around people who are sick. Do not become pregnant while taking this medicine. Women should inform their doctor if they wish to become pregnant or think they might be pregnant. There is a potential for serious side effects to an unborn child. Talk to your health care professional or pharmacist for more information. Do not breast-feed an infant while taking this medicine. Check with your doctor or health care professional if you get an attack of severe diarrhea, nausea and vomiting, or if you sweat a lot. The loss of too much body fluid can make it dangerous for you to take this medicine. You may get dizzy. Do not drive, use machinery, or do anything that needs mental alertness until you know how this medicine affects you. Do not stand or sit up quickly, especially if you are an older patient. This reduces the risk of dizzy or fainting spells. What side effects may I notice from receiving this medicine? Side effects that you should report to your doctor or health care professional as soon as possible: -allergic reactions like skin rash, itching or hives,  swelling of the face, lips, or tongue -breathing problems -chest pain or palpitationschest tightness -cough -dark urine -dizziness -feeling faint or lightheaded -fever or chills -general ill feeling or flu-like symptoms -light-colored stools -palpitations -right upper belly pain -swelling of the legs or ankles -unusual  bleeding or bruising -unusually weak or tired -yellowing of the eyes or skin Side effects that usually do not require medical attention (Report these to your doctor or health care professional if they continue or are bothersome.): -diarrhea -headache -nausea, vomiting -tiredness This list may not describe all possible side effects. Call your doctor for medical advice about side effects. You may report side effects to FDA at 1-800-FDA-1088. Where should I keep my medicine? This drug is given in a hospital or clinic and will not be stored at home. NOTE: This sheet is a summary. It may not cover all possible information. If you have questions about this medicine, talk to your doctor, pharmacist, or health care provider.  2015, [employer]. (2012-02-06 17:02:29)

## 2015-02-04 ENCOUNTER — Other Ambulatory Visit: Payer: Self-pay | Admitting: Internal Medicine

## 2015-02-06 ENCOUNTER — Ambulatory Visit: Payer: Medicare Other | Admitting: Pharmacist

## 2015-02-06 ENCOUNTER — Other Ambulatory Visit (HOSPITAL_BASED_OUTPATIENT_CLINIC_OR_DEPARTMENT_OTHER): Payer: Medicare Other

## 2015-02-06 ENCOUNTER — Ambulatory Visit (HOSPITAL_BASED_OUTPATIENT_CLINIC_OR_DEPARTMENT_OTHER): Payer: Medicare Other | Admitting: Nurse Practitioner

## 2015-02-06 ENCOUNTER — Ambulatory Visit (HOSPITAL_BASED_OUTPATIENT_CLINIC_OR_DEPARTMENT_OTHER): Payer: Medicare Other

## 2015-02-06 VITALS — BP 130/60 | HR 65 | Temp 98.5°F | Resp 18

## 2015-02-06 DIAGNOSIS — C9000 Multiple myeloma not having achieved remission: Secondary | ICD-10-CM

## 2015-02-06 DIAGNOSIS — Z5112 Encounter for antineoplastic immunotherapy: Secondary | ICD-10-CM

## 2015-02-06 DIAGNOSIS — G8929 Other chronic pain: Secondary | ICD-10-CM

## 2015-02-06 DIAGNOSIS — Z5111 Encounter for antineoplastic chemotherapy: Secondary | ICD-10-CM

## 2015-02-06 DIAGNOSIS — I82401 Acute embolism and thrombosis of unspecified deep veins of right lower extremity: Secondary | ICD-10-CM

## 2015-02-06 DIAGNOSIS — Z7901 Long term (current) use of anticoagulants: Secondary | ICD-10-CM

## 2015-02-06 DIAGNOSIS — I82402 Acute embolism and thrombosis of unspecified deep veins of left lower extremity: Secondary | ICD-10-CM

## 2015-02-06 LAB — CBC WITH DIFFERENTIAL/PLATELET
BASO%: 0 % (ref 0.0–2.0)
Basophils Absolute: 0 10*3/uL (ref 0.0–0.1)
EOS ABS: 0.1 10*3/uL (ref 0.0–0.5)
EOS%: 1.3 % (ref 0.0–7.0)
HCT: 34.7 % — ABNORMAL LOW (ref 34.8–46.6)
HGB: 11 g/dL — ABNORMAL LOW (ref 11.6–15.9)
LYMPH%: 7.3 % — ABNORMAL LOW (ref 14.0–49.7)
MCH: 30.6 pg (ref 25.1–34.0)
MCHC: 31.7 g/dL (ref 31.5–36.0)
MCV: 96.4 fL (ref 79.5–101.0)
MONO#: 0.3 10*3/uL (ref 0.1–0.9)
MONO%: 8.5 % (ref 0.0–14.0)
NEUT#: 3.2 10*3/uL (ref 1.5–6.5)
NEUT%: 82.9 % — AB (ref 38.4–76.8)
PLATELETS: 158 10*3/uL (ref 145–400)
RBC: 3.6 10*6/uL — ABNORMAL LOW (ref 3.70–5.45)
RDW: 16 % — ABNORMAL HIGH (ref 11.2–14.5)
WBC: 3.9 10*3/uL (ref 3.9–10.3)
lymph#: 0.3 10*3/uL — ABNORMAL LOW (ref 0.9–3.3)

## 2015-02-06 LAB — COMPREHENSIVE METABOLIC PANEL (CC13)
ALBUMIN: 3 g/dL — AB (ref 3.5–5.0)
ALT: 20 U/L (ref 0–55)
AST: 16 U/L (ref 5–34)
Alkaline Phosphatase: 59 U/L (ref 40–150)
Anion Gap: 9 mEq/L (ref 3–11)
BUN: 17.5 mg/dL (ref 7.0–26.0)
CO2: 29 mEq/L (ref 22–29)
CREATININE: 0.9 mg/dL (ref 0.6–1.1)
Calcium: 8.8 mg/dL (ref 8.4–10.4)
Chloride: 105 mEq/L (ref 98–109)
EGFR: 74 mL/min/{1.73_m2} — ABNORMAL LOW (ref >90)
GLUCOSE: 164 mg/dL — AB (ref 70–140)
POTASSIUM: 3.7 meq/L (ref 3.5–5.1)
Sodium: 143 mEq/L (ref 136–145)
Total Bilirubin: 0.35 mg/dL (ref 0.20–1.20)
Total Protein: 5.3 g/dL — ABNORMAL LOW (ref 6.4–8.3)

## 2015-02-06 LAB — PROTIME-INR
INR: 3.1 (ref 2.00–3.50)
PROTIME: 37.2 s — AB (ref 10.6–13.4)

## 2015-02-06 LAB — POCT INR: INR: 3.1

## 2015-02-06 MED ORDER — SODIUM CHLORIDE 0.9 % IV SOLN
300.0000 mg/m2 | Freq: Once | INTRAVENOUS | Status: AC
  Administered 2015-02-06: 440 mg via INTRAVENOUS
  Filled 2015-02-06: qty 22

## 2015-02-06 MED ORDER — DEXTROSE 5 % IV SOLN
36.0000 mg/m2 | Freq: Once | INTRAVENOUS | Status: AC
  Administered 2015-02-06: 54 mg via INTRAVENOUS
  Filled 2015-02-06: qty 27

## 2015-02-06 MED ORDER — SODIUM CHLORIDE 0.9 % IJ SOLN
10.0000 mL | INTRAMUSCULAR | Status: DC | PRN
  Filled 2015-02-06: qty 10

## 2015-02-06 MED ORDER — SODIUM CHLORIDE 0.9 % IV SOLN: Freq: Once | INTRAVENOUS | Status: DC

## 2015-02-06 MED ORDER — HEPARIN SOD (PORK) LOCK FLUSH 100 UNIT/ML IV SOLN
500.0000 [IU] | Freq: Once | INTRAVENOUS | Status: DC | PRN
  Filled 2015-02-06: qty 5

## 2015-02-06 MED ORDER — SODIUM CHLORIDE 0.9 % IV SOLN
Freq: Once | INTRAVENOUS | Status: AC
  Administered 2015-02-06: 15:00:00 via INTRAVENOUS
  Filled 2015-02-06: qty 4

## 2015-02-06 MED ORDER — SODIUM CHLORIDE 0.9 % IV SOLN
Freq: Once | INTRAVENOUS | Status: AC
  Administered 2015-02-06: 15:00:00 via INTRAVENOUS

## 2015-02-06 NOTE — Progress Notes (Signed)
Pt. States that she has had pain in lower back and bileteral knees and legs since 02/03/15 and asked to speak with a provider. Michel Harrow FNP called. Pt to be seen while in  infusion.

## 2015-02-06 NOTE — Patient Instructions (Signed)
[facility] Discharge Instructions for Patients Receiving Chemotherapy  Today you received the following chemotherapy agents: Cytoxan and Kyprolis.  To help prevent nausea and vomiting after your treatment, we encourage you to take your nausea medication: Compazine. Take one every 6 hours as needed.    If you develop nausea and vomiting that is not controlled by your nausea medication, call the clinic.   BELOW ARE SYMPTOMS THAT SHOULD BE REPORTED IMMEDIATELY:  *FEVER GREATER THAN 100.5 F  *CHILLS WITH OR WITHOUT FEVER  NAUSEA AND VOMITING THAT IS NOT CONTROLLED WITH YOUR NAUSEA MEDICATION  *UNUSUAL SHORTNESS OF BREATH  *UNUSUAL BRUISING OR BLEEDING  TENDERNESS IN MOUTH AND THROAT WITH OR WITHOUT PRESENCE OF ULCERS  *URINARY PROBLEMS  *BOWEL PROBLEMS  UNUSUAL RASH Items with * indicate a potential emergency and should be followed up as soon as possible.  Feel free to call the clinic should you have any questions or concerns. The clinic phone number is [phone].  Please show the [place] at check-in to the Emergency Department and triage nurse.

## 2015-02-07 ENCOUNTER — Encounter: Payer: Self-pay | Admitting: Nurse Practitioner

## 2015-02-07 ENCOUNTER — Telehealth: Payer: Self-pay

## 2015-02-07 ENCOUNTER — Ambulatory Visit (HOSPITAL_BASED_OUTPATIENT_CLINIC_OR_DEPARTMENT_OTHER): Payer: Medicare Other

## 2015-02-07 VITALS — BP 141/6

## 2015-02-07 DIAGNOSIS — C9000 Multiple myeloma not having achieved remission: Secondary | ICD-10-CM

## 2015-02-07 DIAGNOSIS — Z5112 Encounter for antineoplastic immunotherapy: Secondary | ICD-10-CM

## 2015-02-07 DIAGNOSIS — G8929 Other chronic pain: Secondary | ICD-10-CM | POA: Insufficient documentation

## 2015-02-07 MED ORDER — SODIUM CHLORIDE 0.9 % IV SOLN
Freq: Once | INTRAVENOUS | Status: AC
  Administered 2015-02-07: 16:00:00 via INTRAVENOUS
  Filled 2015-02-07: qty 4

## 2015-02-07 MED ORDER — CARFILZOMIB CHEMO INJECTION 60 MG
36.0000 mg/m2 | Freq: Once | INTRAVENOUS | Status: AC
  Administered 2015-02-07: 54 mg via INTRAVENOUS
  Filled 2015-02-07: qty 27

## 2015-02-07 MED ORDER — SODIUM CHLORIDE 0.9 % IV SOLN
Freq: Once | INTRAVENOUS | Status: AC
  Administered 2015-02-07: 15:00:00 via INTRAVENOUS

## 2015-02-07 NOTE — Patient Instructions (Signed)
[facility] Discharge Instructions for Patients Receiving Chemotherapy  Today you received the following chemotherapy agents: kyprolis  To help prevent nausea and vomiting after your treatment, we encourage you to take your nausea medication.  Take it as often as prescribed.     If you develop nausea and vomiting that is not controlled by your nausea medication, call the clinic. If it is after clinic hours your family physician or the after hours number for the clinic or go to the Emergency Department.   BELOW ARE SYMPTOMS THAT SHOULD BE REPORTED IMMEDIATELY:  *FEVER GREATER THAN 100.5 F  *CHILLS WITH OR WITHOUT FEVER  NAUSEA AND VOMITING THAT IS NOT CONTROLLED WITH YOUR NAUSEA MEDICATION  *UNUSUAL SHORTNESS OF BREATH  *UNUSUAL BRUISING OR BLEEDING  TENDERNESS IN MOUTH AND THROAT WITH OR WITHOUT PRESENCE OF ULCERS  *URINARY PROBLEMS  *BOWEL PROBLEMS  UNUSUAL RASH Items with * indicate a potential emergency and should be followed up as soon as possible.  Feel free to call the clinic you have any questions or concerns. The clinic phone number is [phone].   I have been informed and understand all the instructions given to me. I know to contact the clinic, my physician, or go to the Emergency Department if any problems should occur. I do not have any questions at this time, but understand that I may call the clinic during office hours   should I have any questions or need assistance in obtaining follow up care.    __________________________________________  _____________  __________ Signature of Patient or Authorized Representative            Date                   Time    __________________________________________ Nurse's Signature    

## 2015-02-07 NOTE — Assessment & Plan Note (Signed)
Patient presented to the [facility] today to receive cycle 6, day 15 of her chemotherapy regimen.  Blood counts remain stable.  Patient has plans to return tomorrow for cycle 6, day 16 of the same regimen.  Patient will return for labs on 02/13/2015.  Patient will return for labs, visit, and her next chemotherapy on 02/20/2015.

## 2015-02-07 NOTE — Progress Notes (Signed)
SYMPTOM MANAGEMENT CLINIC   HPI: [name] 79 y.o. female diagnosed with multiple myeloma.  Patient is status post radiation therapy and multiple chemotherapy regimens in the past.  Currently undergoing carfilzomib/Cytoxan/dexamethasone therapy.  Patient suffers with chronic pain.  She has been taking oxycodone 5 mg tablets-1-1/2 tablets every 5-6 hours.  She states that the pain in her lower back continues to radiate down both of her back, legs.  She denies any urine or stool incontinence.  She denies any specific numbness, tingling, or weakness.  She denies any recent fevers or chills.  HPI  ROS  Past Medical History  Diagnosis Date  . Blood transfusion   . Depression     Mild  . History of chicken pox   . Hyperlipidemia   . Hypertension   . Degenerative joint disease   . Phlebitis   . Multiple myeloma     per Dr. Julien Nordmann, s/p palliative radiation for leg pain 2011 per Dr. Sondra Come  . Deep vein thrombosis of bilateral lower extremities   . Family history of anesthesia complication     DAUGHTER CPR AFTER  . Cardiomyopathy   . Diabetes mellitus     Steroid related  . UTI (urinary tract infection) 09/08/2013  . History of radiation therapy 08/30/13-09/20/13    20 gray to lower lumbar/upper sacrum    Past Surgical History  Procedure Laterality Date  . Abdominal hysterectomy    . Ankle fracture surgery Left     has Multiple myeloma; Diabetes mellitus without complication; HYPERLIPIDEMIA; DEPRESSION, MILD; HYPERTENSION; DEGENERATIVE JOINT DISEASE; LEG PAIN, RIGHT; Gait instability; Left hip pain; Colon cancer screening; Deep vein thrombosis of left lower extremity; Deep vein thrombosis of right lower extremity; DNR no code (do not resuscitate); Chronic steroid use; Delirium; CAP (community acquired pneumonia); Cellulitis, toe; Paronychia; Sinus bradycardia; Hypokalemia; Long term current use of anticoagulant therapy; Fissure in skin of foot; and Chronic pain on her problem  list.    has No Known Allergies.    Medication List       This list is accurate as of: 02/06/15 11:59 PM.  Always use your most recent med list.               acyclovir 400 MG tablet  Commonly known as:  ZOVIRAX  Take 1 tablet (400 mg total) by mouth 2 (two) times daily.     ADVIL 200 MG tablet  Generic drug:  ibuprofen  Take 400 mg by mouth every 6 (six) hours as needed for headache or moderate pain.     ALPRAZolam 0.5 MG tablet  Commonly known as:  XANAX  TAKE ONE TABLET BY MOUTH TWICE DAILY AS NEEDED FOR ANXIETY     Calcium Carbonate-Vitamin D 600-400 MG-UNIT per tablet  Take 1 tablet by mouth 3 (three) times daily with meals.     dexamethasone 4 MG tablet  Commonly known as:  DECADRON  Take 10 tab (40 mg) weekly except on days when you receive elotuzumab. On those days take 7 tab (28 mg) between 3 & 24 hours prior to the start of elotuzumab.     glipiZIDE 2.5 MG 24 hr tablet  Commonly known as:  GLUCOTROL XL  Take 2.5 mg by mouth daily with breakfast.     lisinopril-hydrochlorothiazide 20-12.5 MG per tablet  Commonly known as:  PRINZIDE,ZESTORETIC  Take 1 tablet by mouth daily.     loperamide 2 MG tablet  Commonly known as:  IMODIUM A-D  Take 1 tablet (  2 mg total) by mouth 4 (four) times daily as needed for diarrhea or loose stools.     magic mouthwash w/lidocaine Soln  Take 5 mLs by mouth 3 (three) times daily as needed for mouth pain (do not swallow, swish and spit 22ml TID PRN).     oxyCODONE 5 MG immediate release tablet  Commonly known as:  Oxy IR/ROXICODONE  Take  1 tablet ( 5 mg) to 1.5 tablets (7.$RemoveBefore'5mg'iarcKWRYMsnfd$  total) by mouth every 6 hours as needed for severe pain     potassium chloride SA 20 MEQ tablet  Commonly known as:  K-DUR,KLOR-CON  Take 1 tablet (20 mEq total) by mouth daily. X 10 days     prochlorperazine 10 MG tablet  Commonly known as:  COMPAZINE  Take 1 tablet (10 mg total) by mouth every 6 (six) hours as needed.     temazepam 30 MG capsule    Commonly known as:  RESTORIL  TAKE ONE CAPSULE BY MOUTH AT BEDTIME AS NEEDED     warfarin 5 MG tablet  Commonly known as:  COUMADIN  TAKE ONE TABLET BY MOUTH AS DIRECTED         PHYSICAL EXAMINATION  Oncology Vitals 02/06/2015 01/31/2015 01/30/2015 01/24/2015 01/23/2015 01/10/2015 01/09/2015  Height - - - - 147 cm - -  Weight - - - - 51.12 kg - -  Weight (lbs) - - - - 112 lbs 11 oz - -  BMI (kg/m2) - - - - 23.55 kg/m2 - -  Temp 98.5 98.3 97.3 98.3 98.1 98.7 97  Pulse 65 67 81 68 72 76 71  Resp $Rem'18 16 18 18 18 18 19  'CRwq$ Resp (Historical as of 04/01/12) - - - - - - -  SpO2 99 - 100 100 100 - 98  BSA (m2) - - - - 1.45 m2 - -   BP Readings from Last 3 Encounters:  02/06/15 130/60  01/31/15 136/66  01/30/15 103/52    Physical Exam  Constitutional: She is oriented to person, place, and time. Vital signs are normal. She appears unhealthy.  HENT:  Head: Normocephalic and atraumatic.  Eyes: Conjunctivae and EOM are normal. Pupils are equal, round, and reactive to light.  Neck: Normal range of motion.  Pulmonary/Chest: Effort normal. No respiratory distress.  Musculoskeletal: Normal range of motion. She exhibits no edema or tenderness.  Neurological: She is alert and oriented to person, place, and time.  Skin: Skin is warm and dry. No rash noted. No erythema. No pallor.  Left heel with approximately 2 cm crack in her heel.  Area directly below the crack is hardened; but no tenderness, erythema, edema, or red streaks to site.  Psychiatric: Affect normal.  Nursing note and vitals reviewed.   LABORATORY DATA:. Appointment on 02/06/2015  Component Date Value Ref Range Status  . Protime 02/06/2015 37.2* 10.6 - 13.4 Seconds Final  . INR 02/06/2015 3.10  2.00 - 3.50 Final   Comment: INR is useful only to assess adequacy of anticoagulation with coumadin when comparing results from different labs. It should not be used to estimate bleeding risk or presence/abscense of coagulopathy in patients not  on coumadin. Expected INR ranges for  nontherapeutic patients is 0.88 - 1.12.   Marland Kitchen Lovenox 02/06/2015 No   Final  . WBC 02/06/2015 3.9  3.9 - 10.3 10e3/uL Final  . NEUT# 02/06/2015 3.2  1.5 - 6.5 10e3/uL Final  . HGB 02/06/2015 11.0* 11.6 - 15.9 g/dL Final  . HCT 02/06/2015 34.7* 34.8 -  46.6 % Final  . Platelets 02/06/2015 158  145 - 400 10e3/uL Final  . MCV 02/06/2015 96.4  79.5 - 101.0 fL Final  . MCH 02/06/2015 30.6  25.1 - 34.0 pg Final  . MCHC 02/06/2015 31.7  31.5 - 36.0 g/dL Final  . RBC 02/06/2015 3.60* 3.70 - 5.45 10e6/uL Final  . RDW 02/06/2015 16.0* 11.2 - 14.5 % Final  . lymph# 02/06/2015 0.3* 0.9 - 3.3 10e3/uL Final  . MONO# 02/06/2015 0.3  0.1 - 0.9 10e3/uL Final  . Eosinophils Absolute 02/06/2015 0.1  0.0 - 0.5 10e3/uL Final  . Basophils Absolute 02/06/2015 0.0  0.0 - 0.1 10e3/uL Final  . NEUT% 02/06/2015 82.9* 38.4 - 76.8 % Final  . LYMPH% 02/06/2015 7.3* 14.0 - 49.7 % Final  . MONO% 02/06/2015 8.5  0.0 - 14.0 % Final  . EOS% 02/06/2015 1.3  0.0 - 7.0 % Final  . BASO% 02/06/2015 0.0  0.0 - 2.0 % Final  . Sodium 02/06/2015 143  136 - 145 mEq/L Final  . Potassium 02/06/2015 3.7  3.5 - 5.1 mEq/L Final  . Chloride 02/06/2015 105  98 - 109 mEq/L Final  . CO2 02/06/2015 29  22 - 29 mEq/L Final  . Glucose 02/06/2015 164* 70 - 140 mg/dl Final  . BUN 02/06/2015 17.5  7.0 - 26.0 mg/dL Final  . Creatinine 02/06/2015 0.9  0.6 - 1.1 mg/dL Final  . Total Bilirubin 02/06/2015 0.35  0.20 - 1.20 mg/dL Final  . Alkaline Phosphatase 02/06/2015 59  40 - 150 U/L Final  . AST 02/06/2015 16  5 - 34 U/L Final  . ALT 02/06/2015 20  0 - 55 U/L Final  . Total Protein 02/06/2015 5.3* 6.4 - 8.3 g/dL Final  . Albumin 02/06/2015 3.0* 3.5 - 5.0 g/dL Final  . Calcium 02/06/2015 8.8  8.4 - 10.4 mg/dL Final  . Anion Gap 02/06/2015 9  3 - 11 mEq/L Final  . EGFR 02/06/2015 74* >90 ml/min/1.73 m2 Final   eGFR is calculated using the CKD-EPI Creatinine Equation (2009)     RADIOGRAPHIC STUDIES: No  results found.  ASSESSMENT/PLAN:    Multiple myeloma Patient presented to the [facility] today to receive cycle 6, day 15 of her chemotherapy regimen.  Blood counts remain stable.  Patient has plans to return tomorrow for cycle 6, day 16 of the same regimen.  Patient will return for labs on 02/13/2015.  Patient will return for labs, visit, and her next chemotherapy on 02/20/2015.  Chronic pain Patient suffers with chronic pain.  She has been taking oxycodone 5 mg tablets-1-1/2 tablets every 5-6 hours.  She states that the pain in her lower back continues to radiate down both of her back, legs.  She denies any urine or stool incontinence.  She denies any specific numbness, tingling, or weakness.  She denies any recent fevers or chills.  Advised patient to try increasing her pain medication to oxycodone 2 tablets every 4 hours and see if that helps.   Patient stated understanding of all instructions; and was in agreement with this plan of care. The patient knows to call the clinic with any problems, questions or concerns.   Review/collaboration with Dr. Julien Nordmann regarding all aspects of patient's visit today.   Total time spent with patient was 15 minutes;  with greater than 75 percent of that time spent in face to face counseling regarding patient's symptoms,  and coordination of care and follow up.  Disclaimer: This note was dictated with voice  recognition software. Similar sounding words can inadvertently be transcribed and may not be corrected upon review.   Drue Second, NP 02/07/2015

## 2015-02-07 NOTE — Assessment & Plan Note (Signed)
Patient suffers with chronic pain.  She has been taking oxycodone 5 mg tablets-1-1/2 tablets every 5-6 hours.  She states that the pain in her lower back continues to radiate down both of her back, legs.  She denies any urine or stool incontinence.  She denies any specific numbness, tingling, or weakness.  She denies any recent fevers or chills.  Advised patient to try increasing her pain medication to oxycodone 2 tablets every 4 hours and see if that helps.

## 2015-02-07 NOTE — Telephone Encounter (Signed)
Follow up with pt from [facility] visit yesterday 02/06/15 in chemo room. Pt states she still has pain but she also fell last night which contributed to the pain. Pt denies any injuries. Informed pt to continue the pain medication as prescribed and if the pain didn't subside to call back. Pt verbalized understanding and denies any further questions or concerns at this time.

## 2015-02-08 ENCOUNTER — Telehealth: Payer: Self-pay | Admitting: *Deleted

## 2015-02-08 ENCOUNTER — Other Ambulatory Visit: Payer: Self-pay | Admitting: *Deleted

## 2015-02-08 DIAGNOSIS — C9000 Multiple myeloma not having achieved remission: Secondary | ICD-10-CM

## 2015-02-08 MED ORDER — DEXAMETHASONE 4 MG PO TABS
ORAL_TABLET | ORAL | Status: DC
Start: 2015-02-08 — End: 2015-06-18

## 2015-02-08 NOTE — Telephone Encounter (Signed)
Refill sent eRx by collaborative nurse.

## 2015-02-08 NOTE — Telephone Encounter (Signed)
[facility] faxed Dexamethasone 4 mg refill request.  Request to provider's desk/in-basket for review.

## 2015-02-11 ENCOUNTER — Other Ambulatory Visit: Payer: Self-pay | Admitting: Family Medicine

## 2015-02-13 ENCOUNTER — Other Ambulatory Visit (HOSPITAL_BASED_OUTPATIENT_CLINIC_OR_DEPARTMENT_OTHER): Payer: Medicare Other

## 2015-02-13 ENCOUNTER — Other Ambulatory Visit: Payer: Self-pay | Admitting: Medical Oncology

## 2015-02-13 DIAGNOSIS — C9000 Multiple myeloma not having achieved remission: Secondary | ICD-10-CM | POA: Diagnosis not present

## 2015-02-13 DIAGNOSIS — Z7901 Long term (current) use of anticoagulants: Secondary | ICD-10-CM

## 2015-02-13 DIAGNOSIS — I82402 Acute embolism and thrombosis of unspecified deep veins of left lower extremity: Secondary | ICD-10-CM

## 2015-02-13 LAB — LACTATE DEHYDROGENASE (CC13): LDH: 376 U/L — AB (ref 125–245)

## 2015-02-13 LAB — COMPREHENSIVE METABOLIC PANEL (CC13)
ALK PHOS: 63 U/L (ref 40–150)
ALT: 22 U/L (ref 0–55)
AST: 18 U/L (ref 5–34)
Albumin: 3.1 g/dL — ABNORMAL LOW (ref 3.5–5.0)
Anion Gap: 12 mEq/L — ABNORMAL HIGH (ref 3–11)
BUN: 14.2 mg/dL (ref 7.0–26.0)
CO2: 27 meq/L (ref 22–29)
Calcium: 9.2 mg/dL (ref 8.4–10.4)
Chloride: 104 mEq/L (ref 98–109)
Creatinine: 0.8 mg/dL (ref 0.6–1.1)
EGFR: 78 mL/min/{1.73_m2} — ABNORMAL LOW (ref >90)
Glucose: 105 mg/dl (ref 70–140)
Potassium: 4.2 mEq/L (ref 3.5–5.1)
SODIUM: 143 meq/L (ref 136–145)
TOTAL PROTEIN: 5.6 g/dL — AB (ref 6.4–8.3)
Total Bilirubin: 0.34 mg/dL (ref 0.20–1.20)

## 2015-02-13 LAB — PROTIME-INR
INR: 3.6 — ABNORMAL HIGH (ref 2.00–3.50)
Protime: 43.2 Seconds — ABNORMAL HIGH (ref 10.6–13.4)

## 2015-02-13 LAB — CBC WITH DIFFERENTIAL/PLATELET
BASO%: 0.3 % (ref 0.0–2.0)
Basophils Absolute: 0 10*3/uL (ref 0.0–0.1)
EOS ABS: 0.1 10*3/uL (ref 0.0–0.5)
EOS%: 1.4 % (ref 0.0–7.0)
HEMATOCRIT: 34.1 % — AB (ref 34.8–46.6)
HGB: 10.9 g/dL — ABNORMAL LOW (ref 11.6–15.9)
LYMPH%: 4.7 % — ABNORMAL LOW (ref 14.0–49.7)
MCH: 30.5 pg (ref 25.1–34.0)
MCHC: 32.1 g/dL (ref 31.5–36.0)
MCV: 94.8 fL (ref 79.5–101.0)
MONO#: 0.3 10*3/uL (ref 0.1–0.9)
MONO%: 7.8 % (ref 0.0–14.0)
NEUT%: 85.8 % — ABNORMAL HIGH (ref 38.4–76.8)
NEUTROS ABS: 3.5 10*3/uL (ref 1.5–6.5)
Platelets: 193 10*3/uL (ref 145–400)
RBC: 3.59 10*6/uL — AB (ref 3.70–5.45)
RDW: 17.2 % — ABNORMAL HIGH (ref 11.2–14.5)
WBC: 4.1 10*3/uL (ref 3.9–10.3)
lymph#: 0.2 10*3/uL — ABNORMAL LOW (ref 0.9–3.3)

## 2015-02-13 MED ORDER — OXYCODONE HCL 5 MG PO TABS: ORAL_TABLET | ORAL | Status: DC

## 2015-02-13 NOTE — Progress Notes (Signed)
Prescription given to pt.  

## 2015-02-15 LAB — IGG, IGA, IGM
IGM, SERUM: 11 mg/dL — AB (ref 52–322)
IgA: 7 mg/dL — ABNORMAL LOW (ref 69–380)
IgG (Immunoglobin G), Serum: 248 mg/dL — ABNORMAL LOW (ref 690–1700)

## 2015-02-15 LAB — KAPPA/LAMBDA LIGHT CHAINS
Kappa free light chain: 0.03 mg/dL — ABNORMAL LOW (ref 0.33–1.94)
Kappa:Lambda Ratio: 0 — ABNORMAL LOW (ref 0.26–1.65)
LAMBDA FREE LGHT CHN: 16.2 mg/dL — AB (ref 0.57–2.63)

## 2015-02-15 LAB — BETA 2 MICROGLOBULIN, SERUM: BETA 2 MICROGLOBULIN: 3.16 mg/L — AB (ref <=2.51)

## 2015-02-20 ENCOUNTER — Ambulatory Visit (HOSPITAL_BASED_OUTPATIENT_CLINIC_OR_DEPARTMENT_OTHER): Payer: Medicare Other

## 2015-02-20 ENCOUNTER — Encounter: Payer: Self-pay | Admitting: Internal Medicine

## 2015-02-20 ENCOUNTER — Telehealth: Payer: Self-pay | Admitting: Internal Medicine

## 2015-02-20 ENCOUNTER — Ambulatory Visit (HOSPITAL_BASED_OUTPATIENT_CLINIC_OR_DEPARTMENT_OTHER): Payer: Medicare Other | Admitting: Internal Medicine

## 2015-02-20 ENCOUNTER — Other Ambulatory Visit (HOSPITAL_BASED_OUTPATIENT_CLINIC_OR_DEPARTMENT_OTHER): Payer: Medicare Other

## 2015-02-20 VITALS — BP 145/66 | HR 76 | Temp 98.3°F | Resp 18 | Ht <= 58 in | Wt 114.6 lb

## 2015-02-20 DIAGNOSIS — Z5112 Encounter for antineoplastic immunotherapy: Secondary | ICD-10-CM

## 2015-02-20 DIAGNOSIS — L723 Sebaceous cyst: Secondary | ICD-10-CM

## 2015-02-20 DIAGNOSIS — Z7901 Long term (current) use of anticoagulants: Secondary | ICD-10-CM

## 2015-02-20 DIAGNOSIS — I82402 Acute embolism and thrombosis of unspecified deep veins of left lower extremity: Secondary | ICD-10-CM

## 2015-02-20 DIAGNOSIS — G8929 Other chronic pain: Secondary | ICD-10-CM

## 2015-02-20 DIAGNOSIS — C9000 Multiple myeloma not having achieved remission: Secondary | ICD-10-CM

## 2015-02-20 DIAGNOSIS — I82401 Acute embolism and thrombosis of unspecified deep veins of right lower extremity: Secondary | ICD-10-CM

## 2015-02-20 LAB — CBC WITH DIFFERENTIAL/PLATELET
BASO%: 0.5 % (ref 0.0–2.0)
Basophils Absolute: 0 10*3/uL (ref 0.0–0.1)
EOS ABS: 0 10*3/uL (ref 0.0–0.5)
EOS%: 0.9 % (ref 0.0–7.0)
HCT: 35.3 % (ref 34.8–46.6)
HGB: 11.6 g/dL (ref 11.6–15.9)
LYMPH#: 0.3 10*3/uL — AB (ref 0.9–3.3)
LYMPH%: 6.1 % — AB (ref 14.0–49.7)
MCH: 30.8 pg (ref 25.1–34.0)
MCHC: 32.8 g/dL (ref 31.5–36.0)
MCV: 93.7 fL (ref 79.5–101.0)
MONO#: 0.5 10*3/uL (ref 0.1–0.9)
MONO%: 11.6 % (ref 0.0–14.0)
NEUT%: 80.9 % — ABNORMAL HIGH (ref 38.4–76.8)
NEUTROS ABS: 3.4 10*3/uL (ref 1.5–6.5)
PLATELETS: 284 10*3/uL (ref 145–400)
RBC: 3.76 10*6/uL (ref 3.70–5.45)
RDW: 17.3 % — ABNORMAL HIGH (ref 11.2–14.5)
WBC: 4.2 10*3/uL (ref 3.9–10.3)

## 2015-02-20 LAB — COMPREHENSIVE METABOLIC PANEL (CC13)
ALT: 19 U/L (ref 0–55)
AST: 16 U/L (ref 5–34)
Albumin: 3.3 g/dL — ABNORMAL LOW (ref 3.5–5.0)
Alkaline Phosphatase: 69 U/L (ref 40–150)
Anion Gap: 8 mEq/L (ref 3–11)
BILIRUBIN TOTAL: 0.42 mg/dL (ref 0.20–1.20)
BUN: 20.6 mg/dL (ref 7.0–26.0)
CO2: 28 mEq/L (ref 22–29)
CREATININE: 0.8 mg/dL (ref 0.6–1.1)
Calcium: 9.7 mg/dL (ref 8.4–10.4)
Chloride: 105 mEq/L (ref 98–109)
EGFR: 79 mL/min/{1.73_m2} — ABNORMAL LOW (ref >90)
Glucose: 117 mg/dl (ref 70–140)
Potassium: 3.4 mEq/L — ABNORMAL LOW (ref 3.5–5.1)
Sodium: 142 mEq/L (ref 136–145)
Total Protein: 5.7 g/dL — ABNORMAL LOW (ref 6.4–8.3)

## 2015-02-20 LAB — PROTIME-INR
INR: 2.5 (ref 2.00–3.50)
PROTIME: 30 s — AB (ref 10.6–13.4)

## 2015-02-20 LAB — POCT INR: INR: 2.5

## 2015-02-20 MED ORDER — SODIUM CHLORIDE 0.9 % IV SOLN
Freq: Once | INTRAVENOUS | Status: AC
  Administered 2015-02-20: 12:00:00 via INTRAVENOUS
  Filled 2015-02-20: qty 4

## 2015-02-20 MED ORDER — SODIUM CHLORIDE 0.9 % IV SOLN
300.0000 mg/m2 | Freq: Once | INTRAVENOUS | Status: AC
  Administered 2015-02-20: 440 mg via INTRAVENOUS
  Filled 2015-02-20: qty 22

## 2015-02-20 MED ORDER — SODIUM CHLORIDE 0.9 % IV SOLN
250.0000 mL | Freq: Once | INTRAVENOUS | Status: AC
  Administered 2015-02-20: 250 mL via INTRAVENOUS

## 2015-02-20 MED ORDER — DEXTROSE 5 % IV SOLN
36.0000 mg/m2 | Freq: Once | INTRAVENOUS | Status: AC
  Administered 2015-02-20: 54 mg via INTRAVENOUS
  Filled 2015-02-20: qty 27

## 2015-02-20 MED ORDER — SODIUM CHLORIDE 0.9 % IV SOLN: 250.0000 mL | Freq: Once | INTRAVENOUS | Status: DC

## 2015-02-20 NOTE — Progress Notes (Signed)
[facility] [phone]   [phone]  OFFICE PROGRESS NOTE  [name], [place] [address]  DIAGNOSIS:  1. Multiple myeloma diagnosed in September 2009,  2. history of bilateral lower extremity deep vein thrombosis diagnosed in November 2009  3. acute on chronic deep vein thrombosis in the left lower extremity diagnosed in October 2010.   PRIOR THERAPY:  1. status post palliative radiotherapy to the right femur and ischail tuberosity, first was done in December 2011 and the second treatment was completed Jan 08 2011, and the third treatment to the right distal femur completed on 04/30/2011  2. status post palliative radiotherapy to the left proximal femur completed 02/28/2011.  3. Revlimid 25 mg by mouth daily for 21 days every 4 weeks in addition to oral Decadron at 40 mg by mouth on a weekly basis status post 40 cycles.  4. weekly subcutaneous Velcade 1.3 mg/M2 status post 60 cycles, discontinued today secondary to mild disease progression. 5. Pomalyst 4 mg by mouth daily for 21 days every 4 weeks, the patient started the first dose on 12/22/2012 , in addition to Decadron 40 mg weekly. Status post 13 cycles.  6. Chemotherapy according to the [place] [id number] expanded access treatment protocol with Elotuzumab, lenalidomide and dexamthasone. Status post 5 cycles, discontinued today secondary to disease progression.   CURRENT THERAPY:  1. Systemic chemotherapy with Carfilzomib, Cytoxan and Decadron. First cycle 09/05/2014. Status post 6 cycles. 2. Coumadin followed by the Coumadin clinic at the [facility].  3. Zometa 4 mg IV given every 3 months.  INTERVAL HISTORY: [name] 79 y.o. female returns to the clinic today for follow-up visit accompanied by her daughter. The patient is tolerating her current treatment with Carfilzomib, Cytoxan and Decadron fairly well with no significant adverse effect fairly  well. She has no significant complaints today. The sebaceous cyst at the left side of her face has been getting bigger. The patient had referral at home recently with some pain in the sternal area. She denied having any significant weight loss or night sweats. She has no nausea or vomiting. The patient denied having any significant chest pain, shortness of breath, cough or hemoptysis. She started cycle #7 yesterday. She had repeat myeloma panel performed recently and she is here for evaluation and discussion of her lab results and recommendation regarding her condition. For pain management she is currently on oxycodone 2 tablets by mouth every 4-6 hours as needed for pain.  MEDICAL HISTORY: Past Medical History  Diagnosis Date  . Blood transfusion   . Depression     Mild  . History of chicken pox   . Hyperlipidemia   . Hypertension   . Degenerative joint disease   . Phlebitis   . Multiple myeloma     per Dr. Julien Nordmann, s/p palliative radiation for leg pain 2011 per Dr. Sondra Come  . Deep vein thrombosis of bilateral lower extremities   . Family history of anesthesia complication     DAUGHTER CPR AFTER  . Cardiomyopathy   . Diabetes mellitus     Steroid related  . UTI (urinary tract infection) 09/08/2013  . History of radiation therapy 08/30/13-09/20/13    20 gray to lower lumbar/upper sacrum    ALLERGIES:  has No Known Allergies.  MEDICATIONS:  Current Outpatient Prescriptions  Medication Sig Dispense Refill  . acyclovir (ZOVIRAX) 400 MG tablet Take 1 tablet (400 mg total) by mouth 2 (two)  times daily. 60 tablet 0  . ALPRAZolam (XANAX) 0.5 MG tablet TAKE ONE TABLET BY MOUTH TWICE DAILY AS NEEDED FOR ANXIETY 60 tablet 0  . Alum & Mag Hydroxide-Simeth (MAGIC MOUTHWASH W/LIDOCAINE) SOLN Take 5 mLs by mouth 3 (three) times daily as needed for mouth pain (do not swallow, swish and spit 65m TID PRN). 120 mL 0  . Calcium Carbonate-Vitamin D 600-400 MG-UNIT per tablet Take 1 tablet by mouth 3  (three) times daily with meals.      .Marland Kitchendexamethasone (DECADRON) 4 MG tablet Take 10 tablets (40 mg) weekly on day 1  with Chemotherapy treatment Carfilzomib. 40 tablet 1  . glipiZIDE (GLUCOTROL XL) 2.5 MG 24 hr tablet Take 2.5 mg by mouth daily with breakfast.    . glipiZIDE (GLUCOTROL XL) 2.5 MG 24 hr tablet TAKE ONE TABLET BY MOUTH ONCE DAILY 90 tablet 1  . ibuprofen (ADVIL) 200 MG tablet Take 400 mg by mouth every 6 (six) hours as needed for headache or moderate pain.    .Marland Kitchenlisinopril-hydrochlorothiazide (PRINZIDE,ZESTORETIC) 20-12.5 MG per tablet Take 1 tablet by mouth daily. 90 tablet 3  . loperamide (IMODIUM A-D) 2 MG tablet Take 1 tablet (2 mg total) by mouth 4 (four) times daily as needed for diarrhea or loose stools.    .Marland KitchenoxyCODONE (OXY IR/ROXICODONE) 5 MG immediate release tablet Take  1 tablet ( 5 mg) to 1.5 tablets (7.547mtotal) by mouth every 6 hours as needed for severe pain 90 tablet 0  . potassium chloride SA (K-DUR,KLOR-CON) 20 MEQ tablet Take 1 tablet (20 mEq total) by mouth daily. X 10 days 7 tablet 0  . prochlorperazine (COMPAZINE) 10 MG tablet Take 1 tablet (10 mg total) by mouth every 6 (six) hours as needed. 30 tablet 0  . temazepam (RESTORIL) 30 MG capsule TAKE ONE CAPSULE BY MOUTH AT BEDTIME AS NEEDED 30 capsule 0  . warfarin (COUMADIN) 5 MG tablet TAKE ONE TABLET BY MOUTH AS DIRECTED 60 tablet 0   No current facility-administered medications for this visit.    SURGICAL HISTORY:  Past Surgical History  Procedure Laterality Date  . Abdominal hysterectomy    . Ankle fracture surgery Left     REVIEW OF SYSTEMS:  Constitutional: positive for fatigue Eyes: negative Ears, nose, mouth, throat, and face: negative Respiratory: negative Cardiovascular: negative Gastrointestinal: negative Genitourinary:negative Integument/breast: negative Hematologic/lymphatic: negative Musculoskeletal:positive for bone pain Neurological: negative Behavioral/Psych: negative Endocrine:  negative Allergic/Immunologic: negative   PHYSICAL EXAMINATION: General appearance: alert, cooperative and no distress Head: Normocephalic, without obvious abnormality, atraumatic Neck: no adenopathy, no JVD, supple, symmetrical, trachea midline and thyroid not enlarged, symmetric, no tenderness/mass/nodules Lymph nodes: Cervical, supraclavicular, and axillary nodes normal. Resp: clear to auscultation bilaterally Back: symmetric, no curvature. ROM normal. No CVA tenderness. Cardio: regular rate and rhythm, S1, S2 normal, no murmur, click, rub or gallop GI: soft, non-tender; bowel sounds normal; no masses,  no organomegaly Extremities: extremities normal, atraumatic, no cyanosis or edema Neurologic: Alert and oriented X 3, normal strength and tone. Normal symmetric reflexes. Normal coordination and gait  ECOG PERFORMANCE STATUS: 1 - Symptomatic but completely ambulatory  Blood pressure 145/66, pulse 76, temperature 98.3 F (36.8 C), temperature source Oral, resp. rate 18, height _0  (1.473 m), weight 114 lb 9.6 oz (51.982 kg), SpO2 99 %.  LABORATORY DATA: Lab Results  Component Value Date   WBC 4.2 02/20/2015   HGB 11.6 02/20/2015   HCT 35.3 02/20/2015   MCV 93.7 02/20/2015   PLT 284  02/20/2015      Chemistry      Component Value Date/Time   NA 143 02/13/2015 1509   NA 139 09/07/2014 1624   K 4.2 02/13/2015 1509   K 4.0 09/07/2014 1624   CL 109 09/07/2014 1624   CL 107 02/14/2013 1311   CO2 27 02/13/2015 1509   CO2 23 09/07/2014 1624   BUN 14.2 02/13/2015 1509   BUN 28* 09/07/2014 1624   CREATININE 0.8 02/13/2015 1509   CREATININE 1.12* 09/07/2014 1624      Component Value Date/Time   CALCIUM 9.2 02/13/2015 1509   CALCIUM 8.8 09/07/2014 1624   CALCIUM * 06/13/2008 1605    5.7 CRITICAL RESULT CALLED TO, READ BACK BY AND VERIFIED WITH: JAMIE TRACY,RN [id number] @ [address] (NOTE)  Amended report. Result repeated and verified. CORRECTED ON 10/14 AT 1429:  PREVIOUSLY REPORTED AS Result repeated and verified.   ALKPHOS 63 02/13/2015 1509   ALKPHOS 49 09/19/2014 1447   AST 18 02/13/2015 1509   AST 18 09/19/2014 1447   ALT 22 02/13/2015 1509   ALT 24 09/19/2014 1447   BILITOT 0.34 02/13/2015 1509   BILITOT 0.4 09/19/2014 1447      RADIOGRAPHIC STUDIES: No results found.  ASSESSMENT AND PLAN: This is a very pleasant 79 years old African-American female with history of multiple myeloma status post several chemotherapy regimens lastly including systemic chemotherapy according to the [facility] [place] 204143 clinical trial with Elotuzumab, Revlimid and dexamethasone. She status post 5 cycles and tolerating her treatment fairly well. This was discontinued secondary to disease progression. Unfortunately the recent myeloma panel showed significant increase in the free lambda light chain. I discussed the lab result with the patient and her daughters. She completed treatment with Elotuzumab, Revlimid and dexamethasone but this was discontinued secondary to disease progression. The patient was started on treatment with systemic chemotherapy with Carfilzomib, Cytoxan and dexamethasone as salvage therapy. She is status post 3 cycles and tolerating this treatment fairly well. She is currently undergoing cycle #7 and she is here today for day 8 The recent myeloma panel showed further improvement in her disease. I discussed the lab result with the patient and her daughter. I recommended for the patient to continue her current treatment with Carfilzomib, Cytoxan and dexamethasone. She will proceed with cycle #7 as a scheduled. She would come back for follow-up visit in 4 weeks for reevaluation with the start of cycle #8. For the left face sebaceous cyst, I will refer the patient to ENT for evaluation and consideration of excision of this lesion. She was advised to call immediately if she has any concerning symptoms in the interval. The patient voices understanding of  current disease status and treatment options and is in agreement with the current care plan.  All questions were answered. The patient knows to call the clinic with any problems, questions or concerns. We can certainly see the patient much sooner if necessary.  Disclaimer: This note was dictated with voice recognition software. Similar sounding words can inadvertently be transcribed and may not be corrected upon review.

## 2015-02-20 NOTE — Patient Instructions (Signed)
[facility] Discharge Instructions for Patients Receiving Chemotherapy  Today you received the following chemotherapy agents:  Kyprolis and Cytoxan.  To help prevent nausea and vomiting after your treatment, we encourage you to take your nausea medication as directed.   If you develop nausea and vomiting that is not controlled by your nausea medication, call the clinic.   BELOW ARE SYMPTOMS THAT SHOULD BE REPORTED IMMEDIATELY:  *FEVER GREATER THAN 100.5 F  *CHILLS WITH OR WITHOUT FEVER  NAUSEA AND VOMITING THAT IS NOT CONTROLLED WITH YOUR NAUSEA MEDICATION  *UNUSUAL SHORTNESS OF BREATH  *UNUSUAL BRUISING OR BLEEDING  TENDERNESS IN MOUTH AND THROAT WITH OR WITHOUT PRESENCE OF ULCERS  *URINARY PROBLEMS  *BOWEL PROBLEMS  UNUSUAL RASH Items with * indicate a potential emergency and should be followed up as soon as possible.  Feel free to call the clinic you have any questions or concerns. The clinic phone number is [phone].  Please show the CHEMO ALERT CARD at check-in to the Emergency Department and triage nurse.   

## 2015-02-20 NOTE — Telephone Encounter (Signed)
Appointments made and avs printed for patient,patient to see dr Randell Loop as the surgeon does not see problems of the face(dr mohamed aware) 03/09/15 1:10 for  A 1:30

## 2015-02-20 NOTE — Progress Notes (Signed)
[name] INR is 2.5 today which is within her goal range of 2-3. She has been taking 5 mg on T/Th/Sat, and 2.5 mg AOD. No missed and/or extra doses reported. No medication or dietary changes. No bleeding and/or unusual bruising. [name] reports no issues with her anticoagulation at this time.  Plan: Continue Coumadin 5 mg T/Th/Sat and 2.5 mg all other days Return for INR check 03/06/15: lab at 2:30, infusion at 3:00, and we will see you in the infusion area.

## 2015-02-21 ENCOUNTER — Ambulatory Visit (HOSPITAL_BASED_OUTPATIENT_CLINIC_OR_DEPARTMENT_OTHER): Payer: Medicare Other

## 2015-02-21 VITALS — BP 152/71 | HR 62 | Temp 97.9°F | Resp 18

## 2015-02-21 DIAGNOSIS — C9000 Multiple myeloma not having achieved remission: Secondary | ICD-10-CM | POA: Diagnosis not present

## 2015-02-21 DIAGNOSIS — Z5112 Encounter for antineoplastic immunotherapy: Secondary | ICD-10-CM | POA: Diagnosis not present

## 2015-02-21 MED ORDER — SODIUM CHLORIDE 0.9 % IV SOLN
Freq: Once | INTRAVENOUS | Status: AC
  Administered 2015-02-21: 16:00:00 via INTRAVENOUS
  Filled 2015-02-21: qty 4

## 2015-02-21 MED ORDER — SODIUM CHLORIDE 0.9 % IV SOLN
Freq: Once | INTRAVENOUS | Status: AC
  Administered 2015-02-21: 16:00:00 via INTRAVENOUS

## 2015-02-21 MED ORDER — DEXTROSE 5 % IV SOLN
36.0000 mg/m2 | Freq: Once | INTRAVENOUS | Status: AC
  Administered 2015-02-21: 54 mg via INTRAVENOUS
  Filled 2015-02-21: qty 27

## 2015-02-21 MED ORDER — SODIUM CHLORIDE 0.9 % IV SOLN: Freq: Once | INTRAVENOUS | Status: DC

## 2015-02-21 NOTE — Patient Instructions (Signed)
[facility] Discharge Instructions for Patients Receiving Chemotherapy  Today you received the following chemotherapy agents: kyprolis  To help prevent nausea and vomiting after your treatment, we encourage you to take your nausea medication.  Take it as often as prescribed.     If you develop nausea and vomiting that is not controlled by your nausea medication, call the clinic. If it is after clinic hours your family physician or the after hours number for the clinic or go to the Emergency Department.   BELOW ARE SYMPTOMS THAT SHOULD BE REPORTED IMMEDIATELY:  *FEVER GREATER THAN 100.5 F  *CHILLS WITH OR WITHOUT FEVER  NAUSEA AND VOMITING THAT IS NOT CONTROLLED WITH YOUR NAUSEA MEDICATION  *UNUSUAL SHORTNESS OF BREATH  *UNUSUAL BRUISING OR BLEEDING  TENDERNESS IN MOUTH AND THROAT WITH OR WITHOUT PRESENCE OF ULCERS  *URINARY PROBLEMS  *BOWEL PROBLEMS  UNUSUAL RASH Items with * indicate a potential emergency and should be followed up as soon as possible.  Feel free to call the clinic you have any questions or concerns. The clinic phone number is [phone].   I have been informed and understand all the instructions given to me. I know to contact the clinic, my physician, or go to the Emergency Department if any problems should occur. I do not have any questions at this time, but understand that I may call the clinic during office hours   should I have any questions or need assistance in obtaining follow up care.    __________________________________________  _____________  __________ Signature of Patient or Authorized Representative            Date                   Time    __________________________________________ Nurse's Signature    

## 2015-02-26 ENCOUNTER — Telehealth: Payer: Self-pay | Admitting: *Deleted

## 2015-02-26 ENCOUNTER — Other Ambulatory Visit: Payer: Self-pay | Admitting: Medical Oncology

## 2015-02-26 ENCOUNTER — Telehealth: Payer: Self-pay | Admitting: Medical Oncology

## 2015-02-26 DIAGNOSIS — C9000 Multiple myeloma not having achieved remission: Secondary | ICD-10-CM

## 2015-02-26 MED ORDER — OXYCODONE HCL 5 MG PO TABS: ORAL_TABLET | ORAL | Status: DC

## 2015-02-26 NOTE — Telephone Encounter (Signed)
Received message from daughter [name] requesting refill of Oxycodone '5mg'$   - pt takes 1 1/2 tabs  to 2 tablets every 6 hours as needed for pain. Andrea's  Phone     [phone].

## 2015-02-26 NOTE — Telephone Encounter (Signed)
Left message for andrea to pick up rx.

## 2015-02-26 NOTE — Telephone Encounter (Signed)
erroneous

## 2015-02-27 ENCOUNTER — Other Ambulatory Visit (HOSPITAL_BASED_OUTPATIENT_CLINIC_OR_DEPARTMENT_OTHER): Payer: Medicare Other

## 2015-02-27 ENCOUNTER — Ambulatory Visit (HOSPITAL_BASED_OUTPATIENT_CLINIC_OR_DEPARTMENT_OTHER): Payer: Medicare Other

## 2015-02-27 ENCOUNTER — Other Ambulatory Visit: Payer: Self-pay | Admitting: Medical Oncology

## 2015-02-27 VITALS — BP 137/66 | HR 79 | Temp 98.6°F | Resp 17

## 2015-02-27 DIAGNOSIS — Z5112 Encounter for antineoplastic immunotherapy: Secondary | ICD-10-CM

## 2015-02-27 DIAGNOSIS — C9000 Multiple myeloma not having achieved remission: Secondary | ICD-10-CM | POA: Diagnosis not present

## 2015-02-27 DIAGNOSIS — Z5111 Encounter for antineoplastic chemotherapy: Secondary | ICD-10-CM | POA: Diagnosis not present

## 2015-02-27 DIAGNOSIS — E876 Hypokalemia: Secondary | ICD-10-CM

## 2015-02-27 DIAGNOSIS — I82402 Acute embolism and thrombosis of unspecified deep veins of left lower extremity: Secondary | ICD-10-CM | POA: Diagnosis not present

## 2015-02-27 DIAGNOSIS — Z7901 Long term (current) use of anticoagulants: Secondary | ICD-10-CM

## 2015-02-27 LAB — COMPREHENSIVE METABOLIC PANEL (CC13)
ALT: 18 U/L (ref 0–55)
AST: 17 U/L (ref 5–34)
Albumin: 3.2 g/dL — ABNORMAL LOW (ref 3.5–5.0)
Alkaline Phosphatase: 73 U/L (ref 40–150)
Anion Gap: 8 mEq/L (ref 3–11)
BUN: 17.7 mg/dL (ref 7.0–26.0)
CALCIUM: 10 mg/dL (ref 8.4–10.4)
CHLORIDE: 103 meq/L (ref 98–109)
CO2: 33 meq/L — AB (ref 22–29)
CREATININE: 0.8 mg/dL (ref 0.6–1.1)
EGFR: 84 mL/min/{1.73_m2} — ABNORMAL LOW (ref >90)
Glucose: 150 mg/dl — ABNORMAL HIGH (ref 70–140)
Potassium: 2.8 mEq/L — CL (ref 3.5–5.1)
Sodium: 144 mEq/L (ref 136–145)
Total Bilirubin: 0.4 mg/dL (ref 0.20–1.20)
Total Protein: 5.6 g/dL — ABNORMAL LOW (ref 6.4–8.3)

## 2015-02-27 LAB — PROTIME-INR
INR: 2.6 (ref 2.00–3.50)
Protime: 31.2 Seconds — ABNORMAL HIGH (ref 10.6–13.4)

## 2015-02-27 LAB — CBC WITH DIFFERENTIAL/PLATELET
BASO%: 0.2 % (ref 0.0–2.0)
BASOS ABS: 0 10*3/uL (ref 0.0–0.1)
EOS%: 0.9 % (ref 0.0–7.0)
Eosinophils Absolute: 0 10*3/uL (ref 0.0–0.5)
HEMATOCRIT: 36 % (ref 34.8–46.6)
HEMOGLOBIN: 11.5 g/dL — AB (ref 11.6–15.9)
LYMPH%: 8.2 % — AB (ref 14.0–49.7)
MCH: 30.6 pg (ref 25.1–34.0)
MCHC: 31.9 g/dL (ref 31.5–36.0)
MCV: 95.7 fL (ref 79.5–101.0)
MONO#: 0.3 10*3/uL (ref 0.1–0.9)
MONO%: 7 % (ref 0.0–14.0)
NEUT#: 3.6 10*3/uL (ref 1.5–6.5)
NEUT%: 83.7 % — AB (ref 38.4–76.8)
PLATELETS: 126 10*3/uL — AB (ref 145–400)
RBC: 3.76 10*6/uL (ref 3.70–5.45)
RDW: 15.6 % — ABNORMAL HIGH (ref 11.2–14.5)
WBC: 4.3 10*3/uL (ref 3.9–10.3)
lymph#: 0.4 10*3/uL — ABNORMAL LOW (ref 0.9–3.3)

## 2015-02-27 MED ORDER — SODIUM CHLORIDE 0.9 % IV SOLN: Freq: Once | INTRAVENOUS | Status: DC

## 2015-02-27 MED ORDER — POTASSIUM CHLORIDE CRYS ER 20 MEQ PO TBCR: 20.0000 meq | EXTENDED_RELEASE_TABLET | Freq: Two times a day (BID) | ORAL | Status: DC

## 2015-02-27 MED ORDER — SODIUM CHLORIDE 0.9 % IV SOLN
300.0000 mg/m2 | Freq: Once | INTRAVENOUS | Status: AC
  Administered 2015-02-27: 440 mg via INTRAVENOUS
  Filled 2015-02-27: qty 22

## 2015-02-27 MED ORDER — SODIUM CHLORIDE 0.9 % IV SOLN
Freq: Once | INTRAVENOUS | Status: AC
  Administered 2015-02-27: 16:00:00 via INTRAVENOUS
  Filled 2015-02-27: qty 4

## 2015-02-27 MED ORDER — DEXTROSE 5 % IV SOLN
36.0000 mg/m2 | Freq: Once | INTRAVENOUS | Status: AC
  Administered 2015-02-27: 54 mg via INTRAVENOUS
  Filled 2015-02-27: qty 27

## 2015-02-27 MED ORDER — SODIUM CHLORIDE 0.9 % IV SOLN
Freq: Once | INTRAVENOUS | Status: AC
  Administered 2015-02-27: 16:00:00 via INTRAVENOUS

## 2015-02-27 NOTE — Progress Notes (Signed)
15:30- K+ 2.8; per Dr. Julien Nordmann pt to take 37mq of K-Dur X 10 days.  Spoke with Diane, RN who will call prescription in to pharmacy for pt.  Pt and pts family verbalized understanding to pick up prescription and take for 10 days.    15:45: Initiated 2543mNS as ordered but pt and pt family member states she drinks plenty at home and does not want additional fluids.  Also, pt reported taking decadron weekly as ordered and verified with pharmacy that additional dose not needed.

## 2015-02-27 NOTE — Patient Instructions (Signed)
[facility] Discharge Instructions for Patients Receiving Chemotherapy  Today you received the following chemotherapy agents Cytoxan/Kyprolis.  To help prevent nausea and vomiting after your treatment, we encourage you to take your nausea medication as directed.   If you develop nausea and vomiting that is not controlled by your nausea medication, call the clinic.   BELOW ARE SYMPTOMS THAT SHOULD BE REPORTED IMMEDIATELY:  *FEVER GREATER THAN 100.5 F  *CHILLS WITH OR WITHOUT FEVER  NAUSEA AND VOMITING THAT IS NOT CONTROLLED WITH YOUR NAUSEA MEDICATION  *UNUSUAL SHORTNESS OF BREATH  *UNUSUAL BRUISING OR BLEEDING  TENDERNESS IN MOUTH AND THROAT WITH OR WITHOUT PRESENCE OF ULCERS  *URINARY PROBLEMS  *BOWEL PROBLEMS  UNUSUAL RASH Items with * indicate a potential emergency and should be followed up as soon as possible.  Feel free to call the clinic you have any questions or concerns. The clinic phone number is [phone].  Please show the [place] at check-in to the Emergency Department and triage nurse.

## 2015-02-27 NOTE — Progress Notes (Signed)
3 RN alerted by family member that patient's IV is leaking while in the bathroom. IV tubing noted to be disconnected from PIV/cap and dripping on floor. Kyprolis infusing with approximately 15 cc left to infuse. Chemo line and primary line clamped and discarded. RNs applied proper PPE and used chemo spill kit to clean in bathroom and environmental services at site when this occurred. Family notified of the event and instructed on skin care, clothing care, and when to call clinic. They were instructed to report to ED with any skin changes such as rash or wounds. Pt to return to [facility] tomorrow for more treatment. PIV flushed and dc'd for discharge. Disposable scrubs given and personal clothes bagged separately. They verbalize understanding of care and know when to return. Discharged ambulatory in stable condition.

## 2015-02-28 ENCOUNTER — Ambulatory Visit (HOSPITAL_BASED_OUTPATIENT_CLINIC_OR_DEPARTMENT_OTHER): Payer: Medicare Other

## 2015-02-28 VITALS — BP 169/69 | HR 61 | Temp 98.3°F | Resp 18

## 2015-02-28 DIAGNOSIS — Z5112 Encounter for antineoplastic immunotherapy: Secondary | ICD-10-CM

## 2015-02-28 DIAGNOSIS — C9000 Multiple myeloma not having achieved remission: Secondary | ICD-10-CM | POA: Diagnosis not present

## 2015-02-28 MED ORDER — SODIUM CHLORIDE 0.9 % IV SOLN
250.0000 mL | Freq: Once | INTRAVENOUS | Status: AC
  Administered 2015-02-28: 250 mL via INTRAVENOUS

## 2015-02-28 MED ORDER — SODIUM CHLORIDE 0.9 % IV SOLN
Freq: Once | INTRAVENOUS | Status: AC
  Administered 2015-02-28: 15:00:00 via INTRAVENOUS
  Filled 2015-02-28: qty 4

## 2015-02-28 MED ORDER — DEXTROSE 5 % IV SOLN
36.0000 mg/m2 | Freq: Once | INTRAVENOUS | Status: AC
  Administered 2015-02-28: 54 mg via INTRAVENOUS
  Filled 2015-02-28: qty 27

## 2015-02-28 NOTE — Patient Instructions (Signed)
[facility] Discharge Instructions for Patients Receiving Chemotherapy  Today you received the following chemotherapy agents: Kyprolis  To help prevent nausea and vomiting after your treatment, we encourage you to take your nausea medication as prescribed by your physician.   If you develop nausea and vomiting that is not controlled by your nausea medication, call the clinic.   BELOW ARE SYMPTOMS THAT SHOULD BE REPORTED IMMEDIATELY:  *FEVER GREATER THAN 100.5 F  *CHILLS WITH OR WITHOUT FEVER  NAUSEA AND VOMITING THAT IS NOT CONTROLLED WITH YOUR NAUSEA MEDICATION  *UNUSUAL SHORTNESS OF BREATH  *UNUSUAL BRUISING OR BLEEDING  TENDERNESS IN MOUTH AND THROAT WITH OR WITHOUT PRESENCE OF ULCERS  *URINARY PROBLEMS  *BOWEL PROBLEMS  UNUSUAL RASH Items with * indicate a potential emergency and should be followed up as soon as possible.  Feel free to call the clinic you have any questions or concerns. The clinic phone number is [phone].  Please show the [place] at check-in to the Emergency Department and triage nurse.

## 2015-03-06 ENCOUNTER — Ambulatory Visit (HOSPITAL_BASED_OUTPATIENT_CLINIC_OR_DEPARTMENT_OTHER): Payer: Medicare Other

## 2015-03-06 ENCOUNTER — Ambulatory Visit (HOSPITAL_BASED_OUTPATIENT_CLINIC_OR_DEPARTMENT_OTHER): Payer: Medicare Other | Admitting: Pharmacist

## 2015-03-06 ENCOUNTER — Other Ambulatory Visit: Payer: Self-pay | Admitting: *Deleted

## 2015-03-06 ENCOUNTER — Other Ambulatory Visit (HOSPITAL_BASED_OUTPATIENT_CLINIC_OR_DEPARTMENT_OTHER): Payer: Medicare Other

## 2015-03-06 VITALS — BP 127/52 | HR 63 | Temp 98.4°F

## 2015-03-06 DIAGNOSIS — I82401 Acute embolism and thrombosis of unspecified deep veins of right lower extremity: Secondary | ICD-10-CM

## 2015-03-06 DIAGNOSIS — C9000 Multiple myeloma not having achieved remission: Secondary | ICD-10-CM

## 2015-03-06 DIAGNOSIS — I82402 Acute embolism and thrombosis of unspecified deep veins of left lower extremity: Secondary | ICD-10-CM

## 2015-03-06 DIAGNOSIS — Z7901 Long term (current) use of anticoagulants: Secondary | ICD-10-CM

## 2015-03-06 DIAGNOSIS — Z5112 Encounter for antineoplastic immunotherapy: Secondary | ICD-10-CM | POA: Diagnosis not present

## 2015-03-06 LAB — CBC WITH DIFFERENTIAL/PLATELET
BASO%: 0.4 % (ref 0.0–2.0)
Basophils Absolute: 0 10*3/uL (ref 0.0–0.1)
EOS%: 0.8 % (ref 0.0–7.0)
Eosinophils Absolute: 0 10*3/uL (ref 0.0–0.5)
HCT: 33.7 % — ABNORMAL LOW (ref 34.8–46.6)
HGB: 10.9 g/dL — ABNORMAL LOW (ref 11.6–15.9)
LYMPH%: 6 % — ABNORMAL LOW (ref 14.0–49.7)
MCH: 30.3 pg (ref 25.1–34.0)
MCHC: 32.3 g/dL (ref 31.5–36.0)
MCV: 93.7 fL (ref 79.5–101.0)
MONO#: 0.3 10*3/uL (ref 0.1–0.9)
MONO%: 7.6 % (ref 0.0–14.0)
NEUT#: 3.7 10*3/uL (ref 1.5–6.5)
NEUT%: 85.2 % — ABNORMAL HIGH (ref 38.4–76.8)
Platelets: 189 10*3/uL (ref 145–400)
RBC: 3.6 10*6/uL — ABNORMAL LOW (ref 3.70–5.45)
RDW: 17.2 % — ABNORMAL HIGH (ref 11.2–14.5)
WBC: 4.3 10*3/uL (ref 3.9–10.3)
lymph#: 0.3 10*3/uL — ABNORMAL LOW (ref 0.9–3.3)

## 2015-03-06 LAB — BASIC METABOLIC PANEL (CC13)
ANION GAP: 11 meq/L (ref 3–11)
BUN: 14.7 mg/dL (ref 7.0–26.0)
CO2: 31 meq/L — AB (ref 22–29)
Calcium: 10 mg/dL (ref 8.4–10.4)
Chloride: 101 mEq/L (ref 98–109)
Creatinine: 0.8 mg/dL (ref 0.6–1.1)
EGFR: 88 mL/min/{1.73_m2} — ABNORMAL LOW (ref >90)
GLUCOSE: 84 mg/dL (ref 70–140)
POTASSIUM: 2.8 meq/L — AB (ref 3.5–5.1)
Sodium: 142 mEq/L (ref 136–145)

## 2015-03-06 LAB — POCT INR: INR: 1.9

## 2015-03-06 LAB — PROTIME-INR
INR: 1.9 — ABNORMAL LOW (ref 2.00–3.50)
Protime: 22.8 Seconds — ABNORMAL HIGH (ref 10.6–13.4)

## 2015-03-06 MED ORDER — POTASSIUM CHLORIDE CRYS ER 20 MEQ PO TBCR: 20.0000 meq | EXTENDED_RELEASE_TABLET | Freq: Two times a day (BID) | ORAL | Status: DC

## 2015-03-06 MED ORDER — SODIUM CHLORIDE 0.9 % IV SOLN
Freq: Once | INTRAVENOUS | Status: AC
  Administered 2015-03-06: 16:00:00 via INTRAVENOUS

## 2015-03-06 MED ORDER — SODIUM CHLORIDE 0.9 % IV SOLN
Freq: Once | INTRAVENOUS | Status: AC
  Administered 2015-03-06: 16:00:00 via INTRAVENOUS
  Filled 2015-03-06: qty 4

## 2015-03-06 MED ORDER — SODIUM CHLORIDE 0.9 % IV SOLN
300.0000 mg/m2 | Freq: Once | INTRAVENOUS | Status: AC
  Administered 2015-03-06: 440 mg via INTRAVENOUS
  Filled 2015-03-06: qty 22

## 2015-03-06 MED ORDER — ZOLEDRONIC ACID 4 MG/100ML IV SOLN
4.0000 mg | Freq: Once | INTRAVENOUS | Status: AC
  Administered 2015-03-06: 4 mg via INTRAVENOUS
  Filled 2015-03-06: qty 100

## 2015-03-06 MED ORDER — DEXTROSE 5 % IV SOLN
36.0000 mg/m2 | Freq: Once | INTRAVENOUS | Status: AC
  Administered 2015-03-06: 54 mg via INTRAVENOUS
  Filled 2015-03-06: qty 27

## 2015-03-06 NOTE — Progress Notes (Signed)
Ok to proceed with treatment.  Dr Julien Nordmann to resend RX for potassium to pt pharmacy.  Pt forgot to pick up RX last week, will pick up today

## 2015-03-06 NOTE — Telephone Encounter (Signed)
Labs reviewed with MD,Pt K+ 2.8, K-Dur called into pt pharmacy per MD. Pt advised to pick up Rx on way home from infusion today.

## 2015-03-06 NOTE — Progress Notes (Signed)
Pt seen during her infusion INR=1.9 No changes to report No new meds or diet changes  Continue Coumadin 5 mg on Tues/Thurs/Sat and take 2.5 mg all other days.  Recheck PT/INR on 03/20/15.  We will see you in the treatment area

## 2015-03-06 NOTE — Patient Instructions (Signed)
Continue Coumadin 5 mg on Tues/Thurs/Sat and take 2.5 mg all other days.  Recheck PT/INR on 03/20/15.  We will see you in the treatment area

## 2015-03-06 NOTE — Patient Instructions (Signed)
[facility] Discharge Instructions for Patients Receiving Chemotherapy  Today you received the following chemotherapy agents cytoxan/kyprolis  To help prevent nausea and vomiting after your treatment, we encourage you to take your nausea medication as directed   If you develop nausea and vomiting that is not controlled by your nausea medication, call the clinic.   BELOW ARE SYMPTOMS THAT SHOULD BE REPORTED IMMEDIATELY:  *FEVER GREATER THAN 100.5 F  *CHILLS WITH OR WITHOUT FEVER  NAUSEA AND VOMITING THAT IS NOT CONTROLLED WITH YOUR NAUSEA MEDICATION  *UNUSUAL SHORTNESS OF BREATH  *UNUSUAL BRUISING OR BLEEDING  TENDERNESS IN MOUTH AND THROAT WITH OR WITHOUT PRESENCE OF ULCERS  *URINARY PROBLEMS  *BOWEL PROBLEMS  UNUSUAL RASH Items with * indicate a potential emergency and should be followed up as soon as possible.  Feel free to call the clinic you have any questions or concerns. The clinic phone number is [phone].

## 2015-03-07 ENCOUNTER — Other Ambulatory Visit: Payer: Self-pay | Admitting: Internal Medicine

## 2015-03-07 ENCOUNTER — Telehealth: Payer: Self-pay | Admitting: *Deleted

## 2015-03-07 ENCOUNTER — Other Ambulatory Visit: Payer: Self-pay | Admitting: *Deleted

## 2015-03-07 ENCOUNTER — Ambulatory Visit (HOSPITAL_BASED_OUTPATIENT_CLINIC_OR_DEPARTMENT_OTHER): Payer: Medicare Other

## 2015-03-07 VITALS — BP 160/75 | HR 66 | Temp 97.7°F | Resp 16

## 2015-03-07 DIAGNOSIS — C9000 Multiple myeloma not having achieved remission: Secondary | ICD-10-CM

## 2015-03-07 DIAGNOSIS — Z5112 Encounter for antineoplastic immunotherapy: Secondary | ICD-10-CM | POA: Diagnosis not present

## 2015-03-07 MED ORDER — SODIUM CHLORIDE 0.9 % IV SOLN
Freq: Once | INTRAVENOUS | Status: AC
  Administered 2015-03-07: 15:00:00 via INTRAVENOUS

## 2015-03-07 MED ORDER — DEXTROSE 5 % IV SOLN
36.0000 mg/m2 | Freq: Once | INTRAVENOUS | Status: AC
  Administered 2015-03-07: 54 mg via INTRAVENOUS
  Filled 2015-03-07: qty 27

## 2015-03-07 MED ORDER — TEMAZEPAM 30 MG PO CAPS: 30.0000 mg | ORAL_CAPSULE | Freq: Every evening | ORAL | Status: DC | PRN

## 2015-03-07 MED ORDER — OXYCODONE HCL 5 MG PO TABS: ORAL_TABLET | ORAL | Status: DC

## 2015-03-07 MED ORDER — SODIUM CHLORIDE 0.9 % IV SOLN
Freq: Once | INTRAVENOUS | Status: AC
  Administered 2015-03-07: 16:00:00 via INTRAVENOUS
  Filled 2015-03-07: qty 4

## 2015-03-07 NOTE — Telephone Encounter (Signed)
VM message received @ 12:14 pm from pt's daughter. TC back to daughter. She is requesting refill on pt's pain medication. Oxycodone '5mg'$ . Last filled 02/26/16 # 90 tablets. Daughter states pt is taking 2 tablets every 6 hours.  She states she has 2 tablets left at this time.  Should have lasted until at least 03/09/15. Last prescription was 1-1.5 tablets every 6 hours. Please call daughter, Jerilynn Mages @ [phone] when prescription is ready.

## 2015-03-07 NOTE — Patient Instructions (Signed)
[facility] Discharge Instructions for Patients Receiving Chemotherapy  Today you received the following chemotherapy agents: Kyprolis  To help prevent nausea and vomiting after your treatment, we encourage you to take your nausea medication as prescribed by your physician.   If you develop nausea and vomiting that is not controlled by your nausea medication, call the clinic.   BELOW ARE SYMPTOMS THAT SHOULD BE REPORTED IMMEDIATELY:  *FEVER GREATER THAN 100.5 F  *CHILLS WITH OR WITHOUT FEVER  NAUSEA AND VOMITING THAT IS NOT CONTROLLED WITH YOUR NAUSEA MEDICATION  *UNUSUAL SHORTNESS OF BREATH  *UNUSUAL BRUISING OR BLEEDING  TENDERNESS IN MOUTH AND THROAT WITH OR WITHOUT PRESENCE OF ULCERS  *URINARY PROBLEMS  *BOWEL PROBLEMS  UNUSUAL RASH Items with * indicate a potential emergency and should be followed up as soon as possible.  Feel free to call the clinic you have any questions or concerns. The clinic phone number is [phone].  Please show the [place] at check-in to the Emergency Department and triage nurse.

## 2015-03-07 NOTE — Telephone Encounter (Signed)
Reviewed with MD pt request for pain meds and restoril Pain medication changed to 1-2 tablets q 6hrs for pain per MD. Rxs walked to pt in infusion room

## 2015-03-09 ENCOUNTER — Other Ambulatory Visit: Payer: Self-pay | Admitting: *Deleted

## 2015-03-09 DIAGNOSIS — I82402 Acute embolism and thrombosis of unspecified deep veins of left lower extremity: Secondary | ICD-10-CM

## 2015-03-09 DIAGNOSIS — C9000 Multiple myeloma not having achieved remission: Secondary | ICD-10-CM

## 2015-03-09 DIAGNOSIS — L720 Epidermal cyst: Secondary | ICD-10-CM | POA: Diagnosis not present

## 2015-03-09 MED ORDER — ACYCLOVIR 400 MG PO TABS: 400.0000 mg | ORAL_TABLET | Freq: Two times a day (BID) | ORAL | Status: DC

## 2015-03-13 ENCOUNTER — Other Ambulatory Visit (HOSPITAL_BASED_OUTPATIENT_CLINIC_OR_DEPARTMENT_OTHER): Payer: Medicare Other

## 2015-03-13 DIAGNOSIS — C9000 Multiple myeloma not having achieved remission: Secondary | ICD-10-CM

## 2015-03-13 DIAGNOSIS — I82401 Acute embolism and thrombosis of unspecified deep veins of right lower extremity: Secondary | ICD-10-CM | POA: Diagnosis not present

## 2015-03-13 DIAGNOSIS — Z7901 Long term (current) use of anticoagulants: Secondary | ICD-10-CM

## 2015-03-13 LAB — PROTIME-INR
INR: 3.3 (ref 2.00–3.50)
Protime: 39.6 Seconds — ABNORMAL HIGH (ref 10.6–13.4)

## 2015-03-13 LAB — COMPREHENSIVE METABOLIC PANEL (CC13)
ALT: 21 U/L (ref 0–55)
AST: 16 U/L (ref 5–34)
Albumin: 3.3 g/dL — ABNORMAL LOW (ref 3.5–5.0)
Alkaline Phosphatase: 67 U/L (ref 40–150)
Anion Gap: 8 mEq/L (ref 3–11)
BUN: 15.9 mg/dL (ref 7.0–26.0)
CHLORIDE: 105 meq/L (ref 98–109)
CO2: 29 mEq/L (ref 22–29)
Calcium: 10.1 mg/dL (ref 8.4–10.4)
Creatinine: 0.8 mg/dL (ref 0.6–1.1)
EGFR: 79 mL/min/{1.73_m2} — ABNORMAL LOW (ref >90)
Glucose: 108 mg/dl (ref 70–140)
POTASSIUM: 4.6 meq/L (ref 3.5–5.1)
Sodium: 142 mEq/L (ref 136–145)
Total Bilirubin: 0.39 mg/dL (ref 0.20–1.20)
Total Protein: 5.9 g/dL — ABNORMAL LOW (ref 6.4–8.3)

## 2015-03-13 LAB — CBC WITH DIFFERENTIAL/PLATELET
BASO%: 0.2 % (ref 0.0–2.0)
BASOS ABS: 0 10*3/uL (ref 0.0–0.1)
EOS ABS: 0 10*3/uL (ref 0.0–0.5)
EOS%: 0.9 % (ref 0.0–7.0)
HCT: 36.3 % (ref 34.8–46.6)
HEMOGLOBIN: 11.4 g/dL — AB (ref 11.6–15.9)
LYMPH#: 0.2 10*3/uL — AB (ref 0.9–3.3)
LYMPH%: 4.6 % — AB (ref 14.0–49.7)
MCH: 30.3 pg (ref 25.1–34.0)
MCHC: 31.5 g/dL (ref 31.5–36.0)
MCV: 96.4 fL (ref 79.5–101.0)
MONO#: 0.4 10*3/uL (ref 0.1–0.9)
MONO%: 9.9 % (ref 0.0–14.0)
NEUT#: 3.2 10*3/uL (ref 1.5–6.5)
NEUT%: 84.4 % — AB (ref 38.4–76.8)
Platelets: 204 10*3/uL (ref 145–400)
RBC: 3.76 10*6/uL (ref 3.70–5.45)
RDW: 17.3 % — ABNORMAL HIGH (ref 11.2–14.5)
WBC: 3.8 10*3/uL — AB (ref 3.9–10.3)

## 2015-03-13 NOTE — Progress Notes (Signed)
INR essentially at goal today. Hg/Hct: 11/34.7, Pltc = 158 No problems or concerns regarding anticoagulation. No misse or extra coumadin doses. No changes in diet or medications. No unusual bruising. No bleeding noted. No s/s of clotting noted. Continue Coumadin 5 mg on Tues/Thurs/Sat and take 2.5 mg all other days.  Recheck INR on 02/20/15 with scheduled appts. No charge encounter.

## 2015-03-13 NOTE — Patient Instructions (Signed)
Continue Coumadin 5 mg on Tues/Thurs/Sat and take 2.5 mg all other days.  Recheck INR on 02/20/15 with scheduled appts.

## 2015-03-14 ENCOUNTER — Telehealth: Payer: Self-pay | Admitting: Medical Oncology

## 2015-03-14 NOTE — Telephone Encounter (Signed)
Dr Constance Holster has scheduled pt for facial cyst removal on aug 15th under general [facility]. Is date ,location okay and what do they need to do about coumadin? Note to [facility]. Call Ivin Booty or fax note to [phone]

## 2015-03-15 ENCOUNTER — Other Ambulatory Visit: Payer: Self-pay | Admitting: Medical Oncology

## 2015-03-15 ENCOUNTER — Telehealth: Payer: Self-pay | Admitting: Medical Oncology

## 2015-03-15 NOTE — Progress Notes (Signed)
erroneous

## 2015-03-15 NOTE — Telephone Encounter (Addendum)
Information from [facility] regarding surgery , chemo and coumadin sent to Dr Burney Gauze office.  Julien Nordmann will delay chemo by 1-2 weeks for healing, Ok to hold coumadin 2-3 days before surgery and resume day after surgery. It is up to Dr Constance Holster where to do the surgery.

## 2015-03-20 ENCOUNTER — Telehealth: Payer: Self-pay | Admitting: Internal Medicine

## 2015-03-20 ENCOUNTER — Ambulatory Visit (HOSPITAL_BASED_OUTPATIENT_CLINIC_OR_DEPARTMENT_OTHER): Payer: Medicare Other

## 2015-03-20 ENCOUNTER — Encounter: Payer: Self-pay | Admitting: Physician Assistant

## 2015-03-20 ENCOUNTER — Ambulatory Visit (HOSPITAL_BASED_OUTPATIENT_CLINIC_OR_DEPARTMENT_OTHER): Payer: Medicare Other | Admitting: Physician Assistant

## 2015-03-20 ENCOUNTER — Other Ambulatory Visit (HOSPITAL_BASED_OUTPATIENT_CLINIC_OR_DEPARTMENT_OTHER): Payer: Medicare Other

## 2015-03-20 ENCOUNTER — Ambulatory Visit (HOSPITAL_BASED_OUTPATIENT_CLINIC_OR_DEPARTMENT_OTHER): Payer: Self-pay | Admitting: Pharmacist

## 2015-03-20 VITALS — BP 107/63 | HR 74 | Temp 98.4°F | Resp 17 | Ht <= 58 in | Wt 110.9 lb

## 2015-03-20 DIAGNOSIS — G8929 Other chronic pain: Secondary | ICD-10-CM

## 2015-03-20 DIAGNOSIS — Z5112 Encounter for antineoplastic immunotherapy: Secondary | ICD-10-CM

## 2015-03-20 DIAGNOSIS — I82401 Acute embolism and thrombosis of unspecified deep veins of right lower extremity: Secondary | ICD-10-CM

## 2015-03-20 DIAGNOSIS — M545 Low back pain, unspecified: Secondary | ICD-10-CM

## 2015-03-20 DIAGNOSIS — C9000 Multiple myeloma not having achieved remission: Secondary | ICD-10-CM

## 2015-03-20 DIAGNOSIS — Z7901 Long term (current) use of anticoagulants: Secondary | ICD-10-CM

## 2015-03-20 DIAGNOSIS — C9002 Multiple myeloma in relapse: Secondary | ICD-10-CM | POA: Diagnosis not present

## 2015-03-20 DIAGNOSIS — L989 Disorder of the skin and subcutaneous tissue, unspecified: Secondary | ICD-10-CM

## 2015-03-20 LAB — CBC WITH DIFFERENTIAL/PLATELET
BASO%: 0.3 % (ref 0.0–2.0)
Basophils Absolute: 0 10*3/uL (ref 0.0–0.1)
EOS%: 0.5 % (ref 0.0–7.0)
Eosinophils Absolute: 0 10*3/uL (ref 0.0–0.5)
HEMATOCRIT: 36.8 % (ref 34.8–46.6)
HGB: 11.8 g/dL (ref 11.6–15.9)
LYMPH#: 0.3 10*3/uL — AB (ref 0.9–3.3)
LYMPH%: 6 % — AB (ref 14.0–49.7)
MCH: 30.2 pg (ref 25.1–34.0)
MCHC: 32.2 g/dL (ref 31.5–36.0)
MCV: 93.8 fL (ref 79.5–101.0)
MONO#: 0.6 10*3/uL (ref 0.1–0.9)
MONO%: 12.1 % (ref 0.0–14.0)
NEUT#: 3.9 10*3/uL (ref 1.5–6.5)
NEUT%: 81.1 % — AB (ref 38.4–76.8)
Platelets: 305 10*3/uL (ref 145–400)
RBC: 3.92 10*6/uL (ref 3.70–5.45)
RDW: 17.2 % — AB (ref 11.2–14.5)
WBC: 4.8 10*3/uL (ref 3.9–10.3)

## 2015-03-20 LAB — PROTIME-INR
INR: 3.7 — ABNORMAL HIGH (ref 2.00–3.50)
Protime: 44.4 Seconds — ABNORMAL HIGH (ref 10.6–13.4)

## 2015-03-20 LAB — COMPREHENSIVE METABOLIC PANEL (CC13)
ALBUMIN: 3.3 g/dL — AB (ref 3.5–5.0)
ALK PHOS: 63 U/L (ref 40–150)
ALT: 15 U/L (ref 0–55)
ANION GAP: 9 meq/L (ref 3–11)
AST: 15 U/L (ref 5–34)
BUN: 22.9 mg/dL (ref 7.0–26.0)
CALCIUM: 9.8 mg/dL (ref 8.4–10.4)
CO2: 24 meq/L (ref 22–29)
CREATININE: 1 mg/dL (ref 0.6–1.1)
Chloride: 107 mEq/L (ref 98–109)
EGFR: 65 mL/min/{1.73_m2} — ABNORMAL LOW (ref >90)
Glucose: 152 mg/dl — ABNORMAL HIGH (ref 70–140)
Potassium: 3.1 mEq/L — ABNORMAL LOW (ref 3.5–5.1)
Sodium: 140 mEq/L (ref 136–145)
Total Bilirubin: 0.27 mg/dL (ref 0.20–1.20)
Total Protein: 5.7 g/dL — ABNORMAL LOW (ref 6.4–8.3)

## 2015-03-20 LAB — POCT INR: INR: 3.7

## 2015-03-20 MED ORDER — OXYCODONE HCL 5 MG PO TABS: ORAL_TABLET | ORAL | Status: DC

## 2015-03-20 MED ORDER — DEXTROSE 5 % IV SOLN: 36.0000 mg/m2 | Freq: Once | INTRAVENOUS | Status: DC

## 2015-03-20 MED ORDER — SODIUM CHLORIDE 0.9 % IV SOLN
Freq: Once | INTRAVENOUS | Status: AC
  Administered 2015-03-20: 17:00:00 via INTRAVENOUS
  Filled 2015-03-20: qty 4

## 2015-03-20 MED ORDER — SODIUM CHLORIDE 0.9 % IV SOLN: Freq: Once | INTRAVENOUS | Status: DC

## 2015-03-20 MED ORDER — SODIUM CHLORIDE 0.9 % IV SOLN
Freq: Once | INTRAVENOUS | Status: AC
  Administered 2015-03-20: 17:00:00 via INTRAVENOUS

## 2015-03-20 MED ORDER — DEXAMETHASONE 4 MG PO TABS: ORAL_TABLET | ORAL | Status: DC

## 2015-03-20 MED ORDER — DEXTROSE 5 % IV SOLN
36.0000 mg/m2 | Freq: Once | INTRAVENOUS | Status: AC
  Administered 2015-03-20: 54 mg via INTRAVENOUS
  Filled 2015-03-20: qty 27

## 2015-03-20 MED ORDER — SODIUM CHLORIDE 0.9 % IV SOLN
300.0000 mg/m2 | Freq: Once | INTRAVENOUS | Status: AC
  Administered 2015-03-20: 440 mg via INTRAVENOUS
  Filled 2015-03-20: qty 22

## 2015-03-20 NOTE — Telephone Encounter (Signed)
Gave and printed appt sched and avs for pt for July and Aug °

## 2015-03-20 NOTE — Progress Notes (Addendum)
[facility] [phone]   [phone]) [phone]  OFFICE PROGRESS NOTE  [name], MD [place] [id number]  DIAGNOSIS:  1. Multiple myeloma diagnosed in September 2009,  2. history of bilateral lower extremity deep vein thrombosis diagnosed in November 2009  3. acute on chronic deep vein thrombosis in the left lower extremity diagnosed in October 2010.   PRIOR THERAPY:  1. status post palliative radiotherapy to the right femur and ischail tuberosity, first was done in December 2011 and the second treatment was completed Jan 08 2011, and the third treatment to the right distal femur completed on 04/30/2011  2. status post palliative radiotherapy to the left proximal femur completed 02/28/2011.  3. Revlimid 25 mg by mouth daily for 21 days every 4 weeks in addition to oral Decadron at 40 mg by mouth on a weekly basis status post 40 cycles.  4. weekly subcutaneous Velcade 1.3 mg/M2 status post 60 cycles, discontinued today secondary to mild disease progression. 5. Pomalyst 4 mg by mouth daily for 21 days every 4 weeks, the patient started the first dose on 12/22/2012 , in addition to Decadron 40 mg weekly. Status post 13 cycles.  6. Chemotherapy according to the [facility] [id number] expanded access treatment protocol with Elotuzumab, lenalidomide and dexamthasone. Status post 5 cycles, discontinued today secondary to disease progression.   CURRENT THERAPY:  1. Systemic chemotherapy with Carfilzomib, Cytoxan and Decadron. First cycle 09/05/2014. Status post 6 cycles. 2. Coumadin followed by the Coumadin clinic at the [facility].  3. Zometa 4 mg IV given every 3 months.  INTERVAL HISTORY: [name] 79 y.o. female returns to the clinic today for follow-up visit accompanied by her daughter. The patient is tolerating her current treatment with Carfilzomib, Cytoxan and Decadron fairly well with no significant adverse effect fairly  well. She has no significant complaints today. The sebaceous cyst at the left side of her face has been getting bigger. She is scheduled to have it removed in the next couple of weeks. She complains of increased low back pain and has been using 1-1/2to 2 oxycodone tablets every 6 hours or every 4-6 hours since her last office visit. She requests refill prescriptions for her dexamethasone and her oxycodone tablets. She also complains of having decreased appetite. She denied having any significant weight loss or night sweats. She has no nausea or vomiting. The patient denied having any significant chest pain, shortness of breath, cough or hemoptysis.   MEDICAL HISTORY: Past Medical History  Diagnosis Date  . Blood transfusion   . Depression     Mild  . History of chicken pox   . Hyperlipidemia   . Hypertension   . Degenerative joint disease   . Phlebitis   . Multiple myeloma     per Dr. Julien Nordmann, s/p palliative radiation for leg pain 2011 per Dr. Sondra Come  . Deep vein thrombosis of bilateral lower extremities   . Family history of anesthesia complication     DAUGHTER CPR AFTER  . Cardiomyopathy   . Diabetes mellitus     Steroid related  . UTI (urinary tract infection) 09/08/2013  . History of radiation therapy 08/30/13-09/20/13    20 gray to lower lumbar/upper sacrum    ALLERGIES:  has No Known Allergies.  MEDICATIONS:  Current Outpatient Prescriptions  Medication Sig Dispense Refill  . acyclovir (ZOVIRAX) 400 MG tablet Take 1 tablet (400 mg total) by mouth 2 (two) times daily. [place]  tablet 0  . ALPRAZolam (XANAX) 0.5 MG tablet TAKE ONE TABLET BY MOUTH TWICE DAILY AS NEEDED FOR ANXIETY 60 tablet 0  . Alum & Mag Hydroxide-Simeth (MAGIC MOUTHWASH W/LIDOCAINE) SOLN Take 5 mLs by mouth 3 (three) times daily as needed for mouth pain (do not swallow, swish and spit 51ml TID PRN). 120 mL 0  . Calcium Carbonate-Vitamin D 600-400 MG-UNIT per tablet Take 1 tablet by mouth 3 (three) times daily with  meals.      Marland Kitchen dexamethasone (DECADRON) 4 MG tablet Take 10 tablets (40 mg) weekly on day 1  with Chemotherapy treatment Carfilzomib. 40 tablet 1  . glipiZIDE (GLUCOTROL XL) 2.5 MG 24 hr tablet Take 2.5 mg by mouth daily with breakfast.    . glipiZIDE (GLUCOTROL XL) 2.5 MG 24 hr tablet TAKE ONE TABLET BY MOUTH ONCE DAILY 90 tablet 1  . ibuprofen (ADVIL) 200 MG tablet Take 400 mg by mouth every 6 (six) hours as needed for headache or moderate pain.    Marland Kitchen lisinopril-hydrochlorothiazide (PRINZIDE,ZESTORETIC) 20-12.5 MG per tablet Take 1 tablet by mouth daily. 90 tablet 3  . loperamide (IMODIUM A-D) 2 MG tablet Take 1 tablet (2 mg total) by mouth 4 (four) times daily as needed for diarrhea or loose stools.    Marland Kitchen oxyCODONE (OXY IR/ROXICODONE) 5 MG immediate release tablet Take  1-2 tablets ($RemoveBefo'10mg'CuAeqfMfHtz$  total) by mouth every 6 hours as needed for severe pain 90 tablet 0  . potassium chloride SA (K-DUR,KLOR-CON) 20 MEQ tablet Take 1 tablet (20 mEq total) by mouth 2 (two) times daily. 20 tablet 0  . prochlorperazine (COMPAZINE) 10 MG tablet Take 1 tablet (10 mg total) by mouth every 6 (six) hours as needed. 30 tablet 0  . temazepam (RESTORIL) 30 MG capsule Take 1 capsule (30 mg total) by mouth at bedtime as needed. 30 capsule 0  . warfarin (COUMADIN) 5 MG tablet TAKE ONE TABLET BY MOUTH AS DIRECTED 60 tablet 0   No current facility-administered medications for this visit.   Facility-Administered Medications Ordered in Other Visits  Medication Dose Route Frequency Provider Last Rate Last Dose  . 0.9 %  sodium chloride infusion   Intravenous Once Curt Bears, MD      . 0.9 %  sodium chloride infusion   Intravenous Once Curt Bears, MD      . carfilzomib (KYPROLIS) 54 mg in dextrose 5 % 50 mL chemo infusion  36 mg/m2 (Treatment Plan Actual) Intravenous Once Curt Bears, MD      . cyclophosphamide (CYTOXAN) 440 mg in sodium chloride 0.9 % 250 mL chemo infusion  300 mg/m2 (Treatment Plan Actual) Intravenous  Once Curt Bears, MD      . ondansetron (ZOFRAN) 8 mg, dexamethasone (DECADRON) 10 mg in sodium chloride 0.9 % 50 mL IVPB   Intravenous Once Curt Bears, MD        SURGICAL HISTORY:  Past Surgical History  Procedure Laterality Date  . Abdominal hysterectomy    . Ankle fracture surgery Left     REVIEW OF SYSTEMS:  Constitutional: positive for anorexia and fatigue Eyes: negative Ears, nose, mouth, throat, and face: negative Respiratory: negative Cardiovascular: negative Gastrointestinal: negative Genitourinary:negative Integument/breast: negative Hematologic/lymphatic: negative Musculoskeletal:positive for back pain and bone pain Neurological: negative Behavioral/Psych: negative Endocrine: negative Allergic/Immunologic: negative   PHYSICAL EXAMINATION: General appearance: alert, cooperative and no distress Head: Normocephalic, without obvious abnormality, atraumatic Neck: no adenopathy, no JVD, supple, symmetrical, trachea midline and thyroid not enlarged, symmetric, no tenderness/mass/nodules Lymph nodes: Cervical, supraclavicular,  and axillary nodes normal. Resp: clear to auscultation bilaterally Back: symmetric, no curvature. ROM normal. No CVA tenderness. Cardio: regular rate and rhythm, S1, S2 normal, no murmur, click, rub or gallop GI: soft, non-tender; bowel sounds normal; no masses,  no organomegaly Extremities: extremities normal, atraumatic, no cyanosis or edema Neurologic: Alert and oriented X 3, normal strength and tone. Normal symmetric reflexes. Normal coordination and gait  ECOG PERFORMANCE STATUS: 1 - Symptomatic but completely ambulatory  Blood pressure 107/63, pulse 74, temperature 98.4 F (36.9 C), temperature source Oral, resp. rate 17, height $RemoveBe'4\' 10"'FnioYFnvQ$  (1.473 m), weight 110 lb 14.4 oz (50.304 kg), SpO2 98 %.  LABORATORY DATA: Lab Results  Component Value Date   WBC 4.8 03/20/2015   HGB 11.8 03/20/2015   HCT 36.8 03/20/2015   MCV 93.8 03/20/2015     PLT 305 03/20/2015      Chemistry      Component Value Date/Time   NA 140 03/20/2015 1406   NA 139 09/07/2014 1624   K 3.1* 03/20/2015 1406   K 4.0 09/07/2014 1624   CL 109 09/07/2014 1624   CL 107 02/14/2013 1311   CO2 24 03/20/2015 1406   CO2 23 09/07/2014 1624   BUN 22.9 03/20/2015 1406   BUN 28* 09/07/2014 1624   CREATININE 1.0 03/20/2015 1406   CREATININE 1.12* 09/07/2014 1624      Component Value Date/Time   CALCIUM 9.8 03/20/2015 1406   CALCIUM 8.8 09/07/2014 1624   CALCIUM * 06/13/2008 1605    5.7 CRITICAL RESULT CALLED TO, READ BACK BY AND VERIFIED WITH: JAMIE TRACY,RN [id number] @ 8756 BY J SCOTTON (NOTE)  Amended report. Result repeated and verified. CORRECTED ON 10/14 AT 1429: PREVIOUSLY REPORTED AS Result repeated and verified.   ALKPHOS 63 03/20/2015 1406   ALKPHOS 49 09/19/2014 1447   AST 15 03/20/2015 1406   AST 18 09/19/2014 1447   ALT 15 03/20/2015 1406   ALT 24 09/19/2014 1447   BILITOT 0.27 03/20/2015 1406   BILITOT 0.4 09/19/2014 1447      RADIOGRAPHIC STUDIES: No results found.  ASSESSMENT AND PLAN: This is a very pleasant 79 years old African-American female with history of multiple myeloma status post several chemotherapy regimens lastly including systemic chemotherapy according to the [facility] [place] 204143 clinical trial with Elotuzumab, Revlimid and dexamethasone. She status post 5 cycles and tolerating her treatment fairly well. This was discontinued secondary to disease progression. Unfortunately the recent myeloma panel showed significant increase in the free lambda light chain. I discussed the lab result with the patient and her daughters. She completed treatment with Elotuzumab, Revlimid and dexamethasone but this was discontinued secondary to disease progression. The patient was started on treatment with systemic chemotherapy with Carfilzomib, Cytoxan and dexamethasone as salvage therapy. She is  tolerating this treatment fairly well. She is  currently undergoing cycle #7 and she is here today for day 8 The recent myeloma panel showed further improvement in her disease. The patient was discussed with and also seen by Dr. Julien Nordmann.she will continue with her current treatment with Carfilzomib, Cytoxan and dexamethasone. She will proceed with cycle #8 as a scheduled. We will postpone the start of cycle 9 from 04/17/2015 to 04/24/2015 to allow for her surgery to have her left facial/jaw cyst removed. To further evaluate her complaints of increased low back pain we will obtain an MRI of her lumbar spine. A refill for her dexamethasone was sent her pharmacy of record via E scribed and she was given  a prescription for her oxycodone tablets to be taken one and half to 2 tablets by mouth every 4-6 hours as needed for breakthrough pain.  She was advised to call immediately if she has any concerning symptoms in the interval. The patient voices understanding of current disease status and treatment options and is in agreement with the current care plan.  All questions were answered. The patient knows to call the clinic with any problems, questions or concerns. We can certainly see the patient much sooner if necessary.  Carlton Adam, PA-C 03/20/2015  ADDENDUM: Hematology/Oncology Attending: I had a face to face encounter with the patient. I recommended her care plan. This is a very pleasant 79 years old African-American female with relapsed multiple myeloma who is currently undergoing treatment with Carfilzomib, Cytoxan and dexamethasone. She is tolerating her treatment well with no specific complaints. I recommended for her to proceed with cycle #8 today as a scheduled. She was seen recently by Dr. Constance Holster for evaluation of a left facial cyst and she is scheduled for surgical resection in few weeks. We will delay the start of cycle #9 x 1 week to give the patient more time for recovery from her surgery. The patient also complained of persistent low  back pain. I will order MRI of the lumbar spine for evaluation of these area. She will continue with her current pain medication for now. The patient was advised to call immediately if she has any concerning symptoms in the interval.   Disclaimer: This note was dictated with voice recognition software. Similar sounding words can inadvertently be transcribed and may be missed upon review. Eilleen Kempf., MD 03/25/2015

## 2015-03-20 NOTE — Progress Notes (Signed)
INR = 3.7   Goal 2-3 INR is above goal range today. No extra doses, no medication changes. No complications of anticoagulation noted.  Patient seen with her daughter in the infusion area. [name] has a large cyst on her left jawline which will be surgically removed August 15. She states that she has been told that Coumadin will need to be held 2-3 days prior to this procedure. Closer to the procedure, we will coordinate this with her physicians. We will hold Coumadin today, then continue Coumadin 5 mg on Tues/Thurs/Sat and take 2.5 mg all other days.  Recheck PT/INR on 03/17/15. We will her in the treatment area.  [name], PharmD

## 2015-03-20 NOTE — Progress Notes (Signed)
Quick Note:  Call patient with the result and order K replacement ______

## 2015-03-20 NOTE — Patient Instructions (Signed)
[facility] Discharge Instructions for Patients Receiving Chemotherapy  Today you received the following chemotherapy agents cytoxan/kyprolis  To help prevent nausea and vomiting after your treatment, we encourage you to take your nausea medication as directed   If you develop nausea and vomiting that is not controlled by your nausea medication, call the clinic.   BELOW ARE SYMPTOMS THAT SHOULD BE REPORTED IMMEDIATELY:  *FEVER GREATER THAN 100.5 F  *CHILLS WITH OR WITHOUT FEVER  NAUSEA AND VOMITING THAT IS NOT CONTROLLED WITH YOUR NAUSEA MEDICATION  *UNUSUAL SHORTNESS OF BREATH  *UNUSUAL BRUISING OR BLEEDING  TENDERNESS IN MOUTH AND THROAT WITH OR WITHOUT PRESENCE OF ULCERS  *URINARY PROBLEMS  *BOWEL PROBLEMS  UNUSUAL RASH Items with * indicate a potential emergency and should be followed up as soon as possible.  Feel free to call the clinic you have any questions or concerns. The clinic phone number is [phone].

## 2015-03-21 ENCOUNTER — Ambulatory Visit (HOSPITAL_BASED_OUTPATIENT_CLINIC_OR_DEPARTMENT_OTHER): Payer: Medicare Other

## 2015-03-21 VITALS — BP 115/54 | HR 76 | Temp 98.8°F | Resp 20

## 2015-03-21 DIAGNOSIS — C9000 Multiple myeloma not having achieved remission: Secondary | ICD-10-CM | POA: Diagnosis not present

## 2015-03-21 DIAGNOSIS — Z5112 Encounter for antineoplastic immunotherapy: Secondary | ICD-10-CM

## 2015-03-21 DIAGNOSIS — E876 Hypokalemia: Secondary | ICD-10-CM

## 2015-03-21 MED ORDER — CARFILZOMIB CHEMO INJECTION 60 MG
36.0000 mg/m2 | Freq: Once | INTRAVENOUS | Status: AC
  Administered 2015-03-21: 54 mg via INTRAVENOUS
  Filled 2015-03-21: qty 27

## 2015-03-21 MED ORDER — SODIUM CHLORIDE 0.9 % IV SOLN
Freq: Once | INTRAVENOUS | Status: AC
  Administered 2015-03-21: 16:00:00 via INTRAVENOUS

## 2015-03-21 MED ORDER — POTASSIUM CHLORIDE CRYS ER 20 MEQ PO TBCR: 20.0000 meq | EXTENDED_RELEASE_TABLET | Freq: Two times a day (BID) | ORAL | Status: DC

## 2015-03-21 MED ORDER — SODIUM CHLORIDE 0.9 % IV SOLN
Freq: Once | INTRAVENOUS | Status: AC
  Administered 2015-03-21: 16:00:00 via INTRAVENOUS
  Filled 2015-03-21: qty 4

## 2015-03-21 MED ORDER — SODIUM CHLORIDE 0.9 % IV SOLN: Freq: Once | INTRAVENOUS | Status: DC

## 2015-03-21 NOTE — Patient Instructions (Signed)
[facility] Discharge Instructions for Patients Receiving Chemotherapy  Today you received the following chemotherapy agents: Kyprolis   To help prevent nausea and vomiting after your treatment, we encourage you to take your nausea medication as directed.    If you develop nausea and vomiting that is not controlled by your nausea medication, call the clinic.   BELOW ARE SYMPTOMS THAT SHOULD BE REPORTED IMMEDIATELY:  *FEVER GREATER THAN 100.5 F  *CHILLS WITH OR WITHOUT FEVER  NAUSEA AND VOMITING THAT IS NOT CONTROLLED WITH YOUR NAUSEA MEDICATION  *UNUSUAL SHORTNESS OF BREATH  *UNUSUAL BRUISING OR BLEEDING  TENDERNESS IN MOUTH AND THROAT WITH OR WITHOUT PRESENCE OF ULCERS  *URINARY PROBLEMS  *BOWEL PROBLEMS  UNUSUAL RASH Items with * indicate a potential emergency and should be followed up as soon as possible.  Feel free to call the clinic you have any questions or concerns. The clinic phone number is [phone].  Please show the CHEMO ALERT CARD at check-in to the Emergency Department and triage nurse.   

## 2015-03-21 NOTE — Progress Notes (Unsigned)
1638: Patient reports pain at IV site to right hand.  Kyprolis infusion stopped and IV site flushed with normal saline, blood return noted, patient still reports pain at site.  IV site discontinued and new IV started by Rosalio Macadamia, RN and kyprolis infusion restarted at 1650.

## 2015-03-22 ENCOUNTER — Ambulatory Visit: Payer: Self-pay | Admitting: Otolaryngology

## 2015-03-22 NOTE — H&P (Signed)
  Assessment  Myeloma (203.00) (C90.00). Diabetes (250.00) (E11.9). Epidermal cyst of face (706.2) (L72.0). Discussed  Probable epidermoid cyst, left side of face, very large. Given her myeloma, chemotherapy treatment and diabetes, she is at high risk for this becoming infected. Recommend surgical excision. Unfortunately given the location the incision would need to be along the mandible. I will talk with Dr. Earlie Server about timing this with respect to the chemotherapy treatments. Reason For Visit  Pt here for evaluation of cyst on left side of cheek area. HPI  At least a one year history of a cystic mass along the left side of the face that has fluctuated in size. CT scan was done a year ago which I have reviewed. She's currently undergoing chemotherapy for multiple myeloma. Current Meds  Acyclovir 400 MG Oral Tablet;; Qty60; R0; RPT. ALPRAZolam 0.5 MG Oral Tablet;; Qty60; R0; RPT. Dexamethasone 4 MG Oral Tablet;; Qty58; R0; RPT. GlipiZIDE ER 2.5 MG Oral Tablet Extended Release 24 Hour;; Qty90; R0; RPT. Lisinopril-Hydrochlorothiazide 20-12.5 MG Oral Tablet;; Qty45; R0; RPT. OxyCODONE HCl - 5 MG Oral Tablet;; Qty90; R0; RPT. Prochlorperazine Maleate 10 MG Oral Tablet;; Qty30; R0; RPT. Warfarin Sodium 5 MG Oral Tablet;; Qty60; R0; RPT. Potassium Chloride Crys ER 20 MEQ Oral Tablet Extended Release;; Qty10; R0; RPT. Temazepam 30 MG Oral Capsule;; Qty30; R0; RPT. Advil CAPS;; Qty0; R0; RPT. Calcicarb TABS;; Qty0; R0; RPT. Loperamide HCl LIQD;; Qty0; R0; RPT. Magic Mouthwash*;; Qty0; R0; RPT. Past Meds  Azithromycin 250 MG Oral Tablet;; Qty6; R0; RPT. Family Hx  No pertinent family history: Mother. ROS  12 system ROS was obtained and reviewed on the Health Maintenance form dated today.  Positive responses are shown above.  If the symptom is not checked, the patient has denied it. Vital Signs   Recorded by Sallyanne Kuster on 09 Mar 2015 01:48 PM BP:183/73,  BSA Calculated: 1.46 ,  BMI  Calculated: 21.87 ,  Weight: 112 lb , BMI: 21.9 kg/m2,  Height: 5 ft. Physical Exam  APPEARANCE: Well developed, well nourished, in no acute distress.  Normal affect, in a pleasant mood.  Oriented to time, place and person. COMMUNICATION: Normal voice   HEAD & FACE:  No scars, lesions or masses of head and face, except for a 4 or 5 cm doughy mass in the left mandibular subcutaneous area.  Sinuses nontender to palpation.  Salivary glands without mass or tenderness.  Facial strength symmetric.   EYES: EOMI with normal primary gaze alignment. Visual acuity grossly intact.  PERRLA EXTERNAL EAR & NOSE: No scars, lesions or masses  EAC & TYMPANIC MEMBRANE:  EAC shows no obstructing lesions or debris and tympanic membranes are normal bilaterally with good movement to insufflation. GROSS HEARING: Normal   TMJ:  Nontender  INTRANASAL EXAM: No polyps or purulence.  NASOPHARYNX: Normal, without lesions. LIPS, TEETH & GUMS: No lip lesions, edentulous and normal gums. ORAL CAVITY/OROPHARYNX:  Oral mucosa moist without lesion or asymmetry of the palate, tongue, tonsil or posterior pharynx. NECK:  Supple without adenopathy or mass. THYROID:  Normal with no masses palpable.  NEUROLOGIC:  No gross CN deficits. No nystagmus noted.   LYMPHATIC:  No enlarged nodes palpable. Signature  Electronically signed by : Izora Gala  M.D.; 03/09/2015 2:08 PM EST.

## 2015-03-23 NOTE — Patient Instructions (Signed)
Continue labs and chemotherapy as scheduled Follow-up in one month

## 2015-03-25 ENCOUNTER — Other Ambulatory Visit: Payer: Self-pay | Admitting: Internal Medicine

## 2015-03-25 DIAGNOSIS — C9000 Multiple myeloma not having achieved remission: Secondary | ICD-10-CM

## 2015-03-25 DIAGNOSIS — L989 Disorder of the skin and subcutaneous tissue, unspecified: Secondary | ICD-10-CM | POA: Insufficient documentation

## 2015-03-27 ENCOUNTER — Other Ambulatory Visit: Payer: Medicare Other

## 2015-03-27 ENCOUNTER — Other Ambulatory Visit (HOSPITAL_BASED_OUTPATIENT_CLINIC_OR_DEPARTMENT_OTHER): Payer: Medicare Other

## 2015-03-27 ENCOUNTER — Ambulatory Visit (HOSPITAL_BASED_OUTPATIENT_CLINIC_OR_DEPARTMENT_OTHER): Payer: Medicare Other | Admitting: Pharmacist

## 2015-03-27 ENCOUNTER — Ambulatory Visit (HOSPITAL_BASED_OUTPATIENT_CLINIC_OR_DEPARTMENT_OTHER): Payer: Medicare Other

## 2015-03-27 VITALS — BP 154/82 | HR 66 | Temp 98.1°F | Resp 19

## 2015-03-27 DIAGNOSIS — C9000 Multiple myeloma not having achieved remission: Secondary | ICD-10-CM

## 2015-03-27 DIAGNOSIS — I82401 Acute embolism and thrombosis of unspecified deep veins of right lower extremity: Secondary | ICD-10-CM

## 2015-03-27 DIAGNOSIS — Z5112 Encounter for antineoplastic immunotherapy: Secondary | ICD-10-CM

## 2015-03-27 DIAGNOSIS — Z5111 Encounter for antineoplastic chemotherapy: Secondary | ICD-10-CM

## 2015-03-27 DIAGNOSIS — Z7901 Long term (current) use of anticoagulants: Secondary | ICD-10-CM

## 2015-03-27 LAB — PROTIME-INR
INR: 3.1 (ref 2.00–3.50)
Protime: 37.2 Seconds — ABNORMAL HIGH (ref 10.6–13.4)

## 2015-03-27 LAB — COMPREHENSIVE METABOLIC PANEL (CC13)
ALT: 20 U/L (ref 0–55)
ANION GAP: 7 meq/L (ref 3–11)
AST: 15 U/L (ref 5–34)
Albumin: 3.2 g/dL — ABNORMAL LOW (ref 3.5–5.0)
Alkaline Phosphatase: 58 U/L (ref 40–150)
BUN: 24.6 mg/dL (ref 7.0–26.0)
CALCIUM: 10 mg/dL (ref 8.4–10.4)
CHLORIDE: 112 meq/L — AB (ref 98–109)
CO2: 25 meq/L (ref 22–29)
Creatinine: 1.4 mg/dL — ABNORMAL HIGH (ref 0.6–1.1)
EGFR: 42 mL/min/{1.73_m2} — ABNORMAL LOW (ref >90)
Glucose: 108 mg/dl (ref 70–140)
Potassium: 4.8 mEq/L (ref 3.5–5.1)
Sodium: 144 mEq/L (ref 136–145)
TOTAL PROTEIN: 5.6 g/dL — AB (ref 6.4–8.3)
Total Bilirubin: 0.34 mg/dL (ref 0.20–1.20)

## 2015-03-27 LAB — CBC WITH DIFFERENTIAL/PLATELET
BASO%: 0 % (ref 0.0–2.0)
BASOS ABS: 0 10*3/uL (ref 0.0–0.1)
EOS%: 1.4 % (ref 0.0–7.0)
Eosinophils Absolute: 0.1 10*3/uL (ref 0.0–0.5)
HCT: 35.4 % (ref 34.8–46.6)
HGB: 11.3 g/dL — ABNORMAL LOW (ref 11.6–15.9)
LYMPH%: 6.9 % — AB (ref 14.0–49.7)
MCH: 30.7 pg (ref 25.1–34.0)
MCHC: 31.9 g/dL (ref 31.5–36.0)
MCV: 96.2 fL (ref 79.5–101.0)
MONO#: 0.5 10*3/uL (ref 0.1–0.9)
MONO%: 9.7 % (ref 0.0–14.0)
NEUT#: 4.1 10*3/uL (ref 1.5–6.5)
NEUT%: 82 % — ABNORMAL HIGH (ref 38.4–76.8)
PLATELETS: 140 10*3/uL — AB (ref 145–400)
RBC: 3.68 10*6/uL — ABNORMAL LOW (ref 3.70–5.45)
RDW: 15.8 % — AB (ref 11.2–14.5)
WBC: 5 10*3/uL (ref 3.9–10.3)
lymph#: 0.3 10*3/uL — ABNORMAL LOW (ref 0.9–3.3)

## 2015-03-27 LAB — POCT INR: INR: 3.1

## 2015-03-27 MED ORDER — SODIUM CHLORIDE 0.9 % IV SOLN
Freq: Once | INTRAVENOUS | Status: AC
  Administered 2015-03-27: 16:00:00 via INTRAVENOUS
  Filled 2015-03-27: qty 4

## 2015-03-27 MED ORDER — SODIUM CHLORIDE 0.9 % IV SOLN
Freq: Once | INTRAVENOUS | Status: AC
  Administered 2015-03-27: 16:00:00 via INTRAVENOUS

## 2015-03-27 MED ORDER — DEXTROSE 5 % IV SOLN
36.0000 mg/m2 | Freq: Once | INTRAVENOUS | Status: AC
  Administered 2015-03-27: 54 mg via INTRAVENOUS
  Filled 2015-03-27: qty 27

## 2015-03-27 MED ORDER — CYCLOPHOSPHAMIDE CHEMO INJECTION 1 GM
300.0000 mg/m2 | Freq: Once | INTRAMUSCULAR | Status: AC
  Administered 2015-03-27: 440 mg via INTRAVENOUS
  Filled 2015-03-27: qty 22

## 2015-03-27 MED ORDER — DEXAMETHASONE 4 MG PO TABS
40.0000 mg | ORAL_TABLET | Freq: Once | ORAL | Status: AC
  Administered 2015-03-27: 40 mg via ORAL

## 2015-03-27 MED ORDER — DEXAMETHASONE 4 MG PO TABS
ORAL_TABLET | ORAL | Status: AC
  Filled 2015-03-27: qty 10

## 2015-03-27 NOTE — Progress Notes (Signed)
Discussed Decadron regimen with patient and daughter. She is currently taking Decadron 20 mg every Thurs and Fri. Reviewed with Dr. Julien Nordmann: Prefer pt take Decadron 40 mg on Day 1 of treatment. Instructions reviewed with pt and daughter, daughter requests we administer this week's dose. They understand to take Decadron 40 mg on 04/03/15.

## 2015-03-27 NOTE — Progress Notes (Signed)
INR right at goal today at 3.1 (goal 2-3) Pt seen in infusion area No unusual bleeding or bruising No missed or extra doses No diet or medication changes Pt still suffering from a large cyst on her left jawline which will be surgically removed August 15. Coumadin will need to be held ~3 days prior to this procedure. On next visit we will give her instructions: Plan: No changes Continue Coumadin 5 mg on Tues/Thurs/Sat and take 2.5 mg all other days.  Recheck PT/INR on 04/03/15 at 2pm for lab, 2:30pm for infusion.  We will see you in the treatment area.

## 2015-03-27 NOTE — Patient Instructions (Signed)
INR right at goal Continue Coumadin 5 mg on Tues/Thurs/Sat and take 2.5 mg all other days.  Recheck PT/INR on 04/03/15.  We will see you in the treatment area.

## 2015-03-27 NOTE — Patient Instructions (Addendum)
[facility] Discharge Instructions for Patients Receiving Chemotherapy  Today you received the following chemotherapy agents: Kyprolis. Today you also received Decadron 40 mg. You are to take Decadron 10 tablets on the first day of chemo every week. Your next dose will be due the morning of 04/03/15.  To help prevent nausea and vomiting after your treatment, we encourage you to take your nausea medication: Compazine '10MG'$ : Take 1 tablet by mouth every 6 hours as needed.    If you develop nausea and vomiting that is not controlled by your nausea medication, call the clinic.   BELOW ARE SYMPTOMS THAT SHOULD BE REPORTED IMMEDIATELY:  *FEVER GREATER THAN 100.5 F  *CHILLS WITH OR WITHOUT FEVER  NAUSEA AND VOMITING THAT IS NOT CONTROLLED WITH YOUR NAUSEA MEDICATION  *UNUSUAL SHORTNESS OF BREATH  *UNUSUAL BRUISING OR BLEEDING  TENDERNESS IN MOUTH AND THROAT WITH OR WITHOUT PRESENCE OF ULCERS  *URINARY PROBLEMS  *BOWEL PROBLEMS  UNUSUAL RASH Items with * indicate a potential emergency and should be followed up as soon as possible.  Feel free to call the clinic should you have any questions or concerns. The clinic phone number is [phone].  Please show the [facility] at check-in to the Emergency Department and triage nurse.

## 2015-03-28 ENCOUNTER — Ambulatory Visit (HOSPITAL_BASED_OUTPATIENT_CLINIC_OR_DEPARTMENT_OTHER): Payer: Medicare Other

## 2015-03-28 VITALS — BP 152/67 | HR 78 | Temp 97.7°F | Resp 20

## 2015-03-28 DIAGNOSIS — Z5112 Encounter for antineoplastic immunotherapy: Secondary | ICD-10-CM | POA: Diagnosis not present

## 2015-03-28 DIAGNOSIS — C9000 Multiple myeloma not having achieved remission: Secondary | ICD-10-CM | POA: Diagnosis not present

## 2015-03-28 MED ORDER — SODIUM CHLORIDE 0.9 % IJ SOLN
10.0000 mL | INTRAMUSCULAR | Status: DC | PRN
  Filled 2015-03-28: qty 10

## 2015-03-28 MED ORDER — OXYCODONE HCL 5 MG PO TABS: ORAL_TABLET | ORAL | Status: DC

## 2015-03-28 MED ORDER — HEPARIN SOD (PORK) LOCK FLUSH 100 UNIT/ML IV SOLN
500.0000 [IU] | Freq: Once | INTRAVENOUS | Status: DC | PRN
  Filled 2015-03-28: qty 5

## 2015-03-28 MED ORDER — DEXTROSE 5 % IV SOLN
36.0000 mg/m2 | Freq: Once | INTRAVENOUS | Status: AC
  Administered 2015-03-28: 54 mg via INTRAVENOUS
  Filled 2015-03-28: qty 27

## 2015-03-28 MED ORDER — ONDANSETRON HCL 40 MG/20ML IJ SOLN
Freq: Once | INTRAMUSCULAR | Status: AC
  Administered 2015-03-28: 16:00:00 via INTRAVENOUS
  Filled 2015-03-28: qty 4

## 2015-03-28 MED ORDER — SODIUM CHLORIDE 0.9 % IV SOLN
Freq: Once | INTRAVENOUS | Status: AC
  Administered 2015-03-28: 17:00:00 via INTRAVENOUS

## 2015-03-28 MED ORDER — SODIUM CHLORIDE 0.9 % IV SOLN
Freq: Once | INTRAVENOUS | Status: AC
  Administered 2015-03-28: 16:00:00 via INTRAVENOUS

## 2015-03-28 NOTE — Progress Notes (Signed)
Increasd pain in back and left leg. Per Julien Nordmann I instructed [name] to take tylenol or Motrin in between oxycodone.pt daughter present and voiced understanding. Requests oxycodone refill tomorrow. Rx locked up.

## 2015-03-28 NOTE — Patient Instructions (Signed)
[facility] Discharge Instructions for Patients Receiving Chemotherapy  Today you received the following chemotherapy agents: Kyprolis   To help prevent nausea and vomiting after your treatment, we encourage you to take your nausea medication as directed.    If you develop nausea and vomiting that is not controlled by your nausea medication, call the clinic.   BELOW ARE SYMPTOMS THAT SHOULD BE REPORTED IMMEDIATELY:  *FEVER GREATER THAN 100.5 F  *CHILLS WITH OR WITHOUT FEVER  NAUSEA AND VOMITING THAT IS NOT CONTROLLED WITH YOUR NAUSEA MEDICATION  *UNUSUAL SHORTNESS OF BREATH  *UNUSUAL BRUISING OR BLEEDING  TENDERNESS IN MOUTH AND THROAT WITH OR WITHOUT PRESENCE OF ULCERS  *URINARY PROBLEMS  *BOWEL PROBLEMS  UNUSUAL RASH Items with * indicate a potential emergency and should be followed up as soon as possible.  Feel free to call the clinic you have any questions or concerns. The clinic phone number is [phone].  Please show the CHEMO ALERT CARD at check-in to the Emergency Department and triage nurse.   

## 2015-03-29 ENCOUNTER — Ambulatory Visit (HOSPITAL_COMMUNITY)
Admission: RE | Admit: 2015-03-29 | Discharge: 2015-03-29 | Disposition: A | Discharge status: Home / Self Care | Payer: Medicare Other | Source: Ambulatory Visit | Attending: Physician Assistant | Admitting: Physician Assistant

## 2015-03-29 DIAGNOSIS — C9000 Multiple myeloma not having achieved remission: Secondary | ICD-10-CM | POA: Insufficient documentation

## 2015-03-29 DIAGNOSIS — M545 Low back pain, unspecified: Secondary | ICD-10-CM

## 2015-03-29 DIAGNOSIS — M4856XA Collapsed vertebra, not elsewhere classified, lumbar region, initial encounter for fracture: Secondary | ICD-10-CM | POA: Diagnosis not present

## 2015-03-29 DIAGNOSIS — M9973 Connective tissue and disc stenosis of intervertebral foramina of lumbar region: Secondary | ICD-10-CM | POA: Diagnosis not present

## 2015-03-29 MED ORDER — GADOBENATE DIMEGLUMINE 529 MG/ML IV SOLN
10.0000 mL | Freq: Once | INTRAVENOUS | Status: AC | PRN
  Administered 2015-03-29: 10 mL via INTRAVENOUS

## 2015-03-30 ENCOUNTER — Telehealth: Payer: Self-pay | Admitting: *Deleted

## 2015-03-30 NOTE — Telephone Encounter (Signed)
NOTIFIED PT.'S DAUGHTER, [name], THAT DR.Kenney ARE OUT OF THE OFFICE TODAY. IF SHE HAS NOT HEARD ABOUT HER MOTHER'S MRI RESULTS BY Monday AFTERNOON TO CALL BACK TO THIS OFFICE. SHE VOICES UNDERSTANDING.

## 2015-04-02 ENCOUNTER — Other Ambulatory Visit: Payer: Self-pay | Admitting: Medical Oncology

## 2015-04-02 DIAGNOSIS — C9000 Multiple myeloma not having achieved remission: Secondary | ICD-10-CM

## 2015-04-02 NOTE — Telephone Encounter (Signed)
New concerning findings, Degenerative disc  Disease.

## 2015-04-03 ENCOUNTER — Ambulatory Visit (HOSPITAL_BASED_OUTPATIENT_CLINIC_OR_DEPARTMENT_OTHER): Payer: Medicare Other | Admitting: Pharmacist

## 2015-04-03 ENCOUNTER — Ambulatory Visit (HOSPITAL_BASED_OUTPATIENT_CLINIC_OR_DEPARTMENT_OTHER): Payer: Medicare Other

## 2015-04-03 ENCOUNTER — Telehealth: Payer: Self-pay

## 2015-04-03 ENCOUNTER — Other Ambulatory Visit (HOSPITAL_BASED_OUTPATIENT_CLINIC_OR_DEPARTMENT_OTHER): Payer: Medicare Other

## 2015-04-03 VITALS — BP 136/60 | HR 79 | Temp 98.7°F

## 2015-04-03 DIAGNOSIS — I82401 Acute embolism and thrombosis of unspecified deep veins of right lower extremity: Secondary | ICD-10-CM | POA: Diagnosis not present

## 2015-04-03 DIAGNOSIS — Z5111 Encounter for antineoplastic chemotherapy: Secondary | ICD-10-CM | POA: Diagnosis not present

## 2015-04-03 DIAGNOSIS — I82402 Acute embolism and thrombosis of unspecified deep veins of left lower extremity: Secondary | ICD-10-CM

## 2015-04-03 DIAGNOSIS — Z5112 Encounter for antineoplastic immunotherapy: Secondary | ICD-10-CM | POA: Diagnosis not present

## 2015-04-03 DIAGNOSIS — C9000 Multiple myeloma not having achieved remission: Secondary | ICD-10-CM | POA: Diagnosis not present

## 2015-04-03 LAB — CBC WITH DIFFERENTIAL/PLATELET
BASO%: 0.2 % (ref 0.0–2.0)
Basophils Absolute: 0 10*3/uL (ref 0.0–0.1)
EOS%: 0.1 % (ref 0.0–7.0)
Eosinophils Absolute: 0 10*3/uL (ref 0.0–0.5)
HEMATOCRIT: 33.8 % — AB (ref 34.8–46.6)
HGB: 10.7 g/dL — ABNORMAL LOW (ref 11.6–15.9)
LYMPH%: 4 % — ABNORMAL LOW (ref 14.0–49.7)
MCH: 30.2 pg (ref 25.1–34.0)
MCHC: 31.7 g/dL (ref 31.5–36.0)
MCV: 95.4 fL (ref 79.5–101.0)
MONO#: 0 10*3/uL — ABNORMAL LOW (ref 0.1–0.9)
MONO%: 0.7 % (ref 0.0–14.0)
NEUT#: 4.1 10*3/uL (ref 1.5–6.5)
NEUT%: 95 % — ABNORMAL HIGH (ref 38.4–76.8)
Platelets: 169 10*3/uL (ref 145–400)
RBC: 3.55 10*6/uL — AB (ref 3.70–5.45)
RDW: 17.5 % — AB (ref 11.2–14.5)
WBC: 4.3 10*3/uL (ref 3.9–10.3)
lymph#: 0.2 10*3/uL — ABNORMAL LOW (ref 0.9–3.3)

## 2015-04-03 LAB — COMPREHENSIVE METABOLIC PANEL (CC13)
ALT: 22 U/L (ref 0–55)
AST: 16 U/L (ref 5–34)
Albumin: 3.1 g/dL — ABNORMAL LOW (ref 3.5–5.0)
Alkaline Phosphatase: 57 U/L (ref 40–150)
Anion Gap: 10 mEq/L (ref 3–11)
BUN: 24.8 mg/dL (ref 7.0–26.0)
CALCIUM: 9.1 mg/dL (ref 8.4–10.4)
CO2: 21 mEq/L — ABNORMAL LOW (ref 22–29)
CREATININE: 1.2 mg/dL — AB (ref 0.6–1.1)
Chloride: 109 mEq/L (ref 98–109)
EGFR: 51 mL/min/{1.73_m2} — AB (ref >90)
Glucose: 412 mg/dl — ABNORMAL HIGH (ref 70–140)
Potassium: 4 mEq/L (ref 3.5–5.1)
Sodium: 140 mEq/L (ref 136–145)
TOTAL PROTEIN: 5.7 g/dL — AB (ref 6.4–8.3)
Total Bilirubin: 0.34 mg/dL (ref 0.20–1.20)

## 2015-04-03 LAB — PROTIME-INR
INR: 2.8 (ref 2.00–3.50)
PROTIME: 33.6 s — AB (ref 10.6–13.4)

## 2015-04-03 LAB — POCT INR: INR: 2.8

## 2015-04-03 MED ORDER — SODIUM CHLORIDE 0.9 % IV SOLN
300.0000 mg/m2 | Freq: Once | INTRAVENOUS | Status: AC
  Administered 2015-04-03: 440 mg via INTRAVENOUS
  Filled 2015-04-03: qty 22

## 2015-04-03 MED ORDER — SODIUM CHLORIDE 0.9 % IV SOLN
Freq: Once | INTRAVENOUS | Status: AC
  Administered 2015-04-03: 16:00:00 via INTRAVENOUS

## 2015-04-03 MED ORDER — DEXAMETHASONE SODIUM PHOSPHATE 100 MG/10ML IJ SOLN
Freq: Once | INTRAMUSCULAR | Status: AC
  Administered 2015-04-03: 17:00:00 via INTRAVENOUS
  Filled 2015-04-03: qty 4

## 2015-04-03 MED ORDER — DEXTROSE 5 % IV SOLN
36.0000 mg/m2 | Freq: Once | INTRAVENOUS | Status: AC
  Administered 2015-04-03: 54 mg via INTRAVENOUS
  Filled 2015-04-03: qty 27

## 2015-04-03 NOTE — Patient Instructions (Signed)
Carfilzomib injection What is this medicine? CARFILZOMIB (kar FILZ oh mib) is a chemotherapy drug that works by slowing or stopping cancer cell growth. This medicine is used to treat multiple myeloma. This medicine may be used for other purposes; ask your health care provider or pharmacist if you have questions. COMMON BRAND NAME(S): KYPROLIS What should I tell my health care provider before I take this medicine? They need to know if you have any of these conditions: -heart disease -irregular heartbeat -liver disease -lung or breathing disease -an unusual or allergic reaction to carfilzomib, or other medicines, foods, dyes, or preservatives -pregnant or trying to get pregnant -breast-feeding How should I use this medicine? This medicine is for injection or infusion into a vein. It is given by a health care professional in a hospital or clinic setting. Talk to your pediatrician regarding the use of this medicine in children. Special care may be needed. Overdosage: If you think you've taken too much of this medicine contact a poison control center or emergency room at once. Overdosage: If you think you have taken too much of this medicine contact a poison control center or emergency room at once. NOTE: This medicine is only for you. Do not share this medicine with others. What if I miss a dose? It is important not to miss your dose. Call your doctor or health care professional if you are unable to keep an appointment. What may interact with this medicine? Interactions are not expected. Give your health care provider a list of all the medicines, herbs, non-prescription drugs, or dietary supplements you use. Also tell them if you smoke, drink alcohol, or use illegal drugs. Some items may interact with your medicine. This list may not describe all possible interactions. Give your health care provider a list of all the medicines, herbs, non-prescription drugs, or dietary supplements you use. Also  tell them if you smoke, drink alcohol, or use illegal drugs. Some items may interact with your medicine. What should I watch for while using this medicine? Your condition will be monitored carefully while you are receiving this medicine. Report any side effects. Continue your course of treatment even though you feel ill unless your doctor tells you to stop. Call your doctor or health care professional for advice if you get a fever, chills or sore throat, or other symptoms of a cold or flu. Do not treat yourself. Try to avoid being around people who are sick. Do not become pregnant while taking this medicine. Women should inform their doctor if they wish to become pregnant or think they might be pregnant. There is a potential for serious side effects to an unborn child. Talk to your health care professional or pharmacist for more information. Do not breast-feed an infant while taking this medicine. Check with your doctor or health care professional if you get an attack of severe diarrhea, nausea and vomiting, or if you sweat a lot. The loss of too much body fluid can make it dangerous for you to take this medicine. You may get dizzy. Do not drive, use machinery, or do anything that needs mental alertness until you know how this medicine affects you. Do not stand or sit up quickly, especially if you are an older patient. This reduces the risk of dizzy or fainting spells. What side effects may I notice from receiving this medicine? Side effects that you should report to your doctor or health care professional as soon as possible: -allergic reactions like skin rash, itching or hives,  swelling of the face, lips, or tongue -breathing problems -chest pain or palpitationschest tightness -cough -dark urine -dizziness -feeling faint or lightheaded -fever or chills -general ill feeling or flu-like symptoms -light-colored stools -palpitations -right upper belly pain -swelling of the legs or ankles -unusual  bleeding or bruising -unusually weak or tired -yellowing of the eyes or skin Side effects that usually do not require medical attention (Report these to your doctor or health care professional if they continue or are bothersome.): -diarrhea -headache -nausea, vomiting -tiredness This list may not describe all possible side effects. Call your doctor for medical advice about side effects. You may report side effects to FDA at 1-800-FDA-1088. Where should I keep my medicine? This drug is given in a hospital or clinic and will not be stored at home. NOTE: This sheet is a summary. It may not cover all possible information. If you have questions about this medicine, talk to your doctor, pharmacist, or health care provider.  2015, Elsevier/Gold Standard. (2012-02-06 17:02:29) Cyclophosphamide injection What is this medicine? CYCLOPHOSPHAMIDE (sye kloe FOSS fa mide) is a chemotherapy drug. It slows the growth of cancer cells. This medicine is used to treat many types of cancer like lymphoma, myeloma, leukemia, breast cancer, and ovarian cancer, to name a few. This medicine may be used for other purposes; ask your health care provider or pharmacist if you have questions. COMMON BRAND NAME(S): Cytoxan, Neosar What should I tell my health care provider before I take this medicine? They need to know if you have any of these conditions: -blood disorders -history of other chemotherapy -infection -kidney disease -liver disease -recent or ongoing radiation therapy -tumors in the bone marrow -an unusual or allergic reaction to cyclophosphamide, other chemotherapy, other medicines, foods, dyes, or preservatives -pregnant or trying to get pregnant -breast-feeding How should I use this medicine? This drug is usually given as an injection into a vein or muscle or by infusion into a vein. It is administered in a hospital or clinic by a specially trained health care professional. Talk to your pediatrician  regarding the use of this medicine in children. Special care may be needed. Overdosage: If you think you have taken too much of this medicine contact a poison control center or emergency room at once. NOTE: This medicine is only for you. Do not share this medicine with others. What if I miss a dose? It is important not to miss your dose. Call your doctor or health care professional if you are unable to keep an appointment. What may interact with this medicine? This medicine may interact with the following medications: -amiodarone -amphotericin B -azathioprine -certain antiviral medicines for HIV or AIDS such as protease inhibitors (e.g., indinavir, ritonavir) and zidovudine -certain blood pressure medications such as benazepril, captopril, enalapril, fosinopril, lisinopril, moexipril, monopril, perindopril, quinapril, ramipril, trandolapril -certain cancer medications such as anthracyclines (e.g., daunorubicin, doxorubicin), busulfan, cytarabine, paclitaxel, pentostatin, tamoxifen, trastuzumab -certain diuretics such as chlorothiazide, chlorthalidone, hydrochlorothiazide, indapamide, metolazone -certain medicines that treat or prevent blood clots like warfarin -certain muscle relaxants such as succinylcholine -cyclosporine -etanercept -indomethacin -medicines to increase blood counts like filgrastim, pegfilgrastim, sargramostim -medicines used as general anesthesia -metronidazole -natalizumab This list may not describe all possible interactions. Give your health care provider a list of all the medicines, herbs, non-prescription drugs, or dietary supplements you use. Also tell them if you smoke, drink alcohol, or use illegal drugs. Some items may interact with your medicine. What should I watch for while using this medicine? Visit your doctor   for checks on your progress. This drug may make you feel generally unwell. This is not uncommon, as chemotherapy can affect healthy cells as well as  cancer cells. Report any side effects. Continue your course of treatment even though you feel ill unless your doctor tells you to stop. Drink water or other fluids as directed. Urinate often, even at night. In some cases, you may be given additional medicines to help with side effects. Follow all directions for their use. Call your doctor or health care professional for advice if you get a fever, chills or sore throat, or other symptoms of a cold or flu. Do not treat yourself. This drug decreases your body's ability to fight infections. Try to avoid being around people who are sick. This medicine may increase your risk to bruise or bleed. Call your doctor or health care professional if you notice any unusual bleeding. Be careful brushing and flossing your teeth or using a toothpick because you may get an infection or bleed more easily. If you have any dental work done, tell your dentist you are receiving this medicine. You may get drowsy or dizzy. Do not drive, use machinery, or do anything that needs mental alertness until you know how this medicine affects you. Do not become pregnant while taking this medicine or for 1 year after stopping it. Women should inform their doctor if they wish to become pregnant or think they might be pregnant. Men should not father a child while taking this medicine and for 4 months after stopping it. There is a potential for serious side effects to an unborn child. Talk to your health care professional or pharmacist for more information. Do not breast-feed an infant while taking this medicine. This medicine may interfere with the ability to have a child. This medicine has caused ovarian failure in some women. This medicine has caused reduced sperm counts in some men. You should talk with your doctor or health care professional if you are concerned about your fertility. If you are going to have surgery, tell your doctor or health care professional that you have taken this  medicine. What side effects may I notice from receiving this medicine? Side effects that you should report to your doctor or health care professional as soon as possible: -allergic reactions like skin rash, itching or hives, swelling of the face, lips, or tongue -low blood counts - this medicine may decrease the number of white blood cells, red blood cells and platelets. You may be at increased risk for infections and bleeding. -signs of infection - fever or chills, cough, sore throat, pain or difficulty passing urine -signs of decreased platelets or bleeding - bruising, pinpoint red spots on the skin, black, tarry stools, blood in the urine -signs of decreased red blood cells - unusually weak or tired, fainting spells, lightheadedness -breathing problems -dark urine -dizziness -palpitations -swelling of the ankles, feet, hands -trouble passing urine or change in the amount of urine -weight gain -yellowing of the eyes or skin Side effects that usually do not require medical attention (report to your doctor or health care professional if they continue or are bothersome): -changes in nail or skin color -hair loss -missed menstrual periods -mouth sores -nausea, vomiting This list may not describe all possible side effects. Call your doctor for medical advice about side effects. You may report side effects to FDA at 1-800-FDA-1088. Where should I keep my medicine? This drug is given in a hospital or clinic and will not be stored   at home. NOTE: This sheet is a summary. It may not cover all possible information. If you have questions about this medicine, talk to your doctor, pharmacist, or health care provider.  2015, Elsevier/Gold Standard. (2012-07-02 16:22:58)  

## 2015-04-03 NOTE — Telephone Encounter (Signed)
PER DR.MOHAMED- MRI SHOWS DEGENERATIVE DISC DISEASE. LEFT THIS MESSAGE ON PT.'S DAUGHTER, [name] Washabaugh'S, VOICE MAIL.

## 2015-04-03 NOTE — Progress Notes (Addendum)
INR = 2.8  Goal 2-3 INR within goal range. No complications of anticoagulation noted. Mrs. Strike will have a cyst removed on 8/15 by Dr. Constance Holster. We will check with Dr. Constance Holster and Dr. Julien Nordmann and coordinate anticoagulation around her surgery. I will talk with her and her daughter and give her the instructions around surgery tomorrow.  Theone Murdoch, PharmD  04/04/15  Called Dr. Garret Reddish Rosen's office [phone]. Patient will have a cyst removed from her jaw on 04/16/15 and he will be performing the surgery. Dr. Constance Holster and his RN are out of the office today. Left VM asking for instructions for anticoagulation around the procedure. Recording on VM states that all calls will be answered 8/4.         Spoke with Mrs. Sorter and her daughter in the infusion area.   She has an exisiting lab appt 8/9 at 3:15pm.   We will see her at 3:30 in the Coumadin clinic. By that time, we should have the information requested above and be able to give her instructions around the surgery.

## 2015-04-03 NOTE — Telephone Encounter (Signed)
[name] called about her mother asking for clarification of results of MRI and what is the next step. Pt's next MD appt is not until 8/23.

## 2015-04-03 NOTE — Telephone Encounter (Signed)
Please read my previous reply. Mainly degenerative disease. No progressive cancer or Cord involvement.

## 2015-04-04 ENCOUNTER — Telehealth: Payer: Self-pay | Admitting: Pharmacist

## 2015-04-04 ENCOUNTER — Ambulatory Visit (HOSPITAL_BASED_OUTPATIENT_CLINIC_OR_DEPARTMENT_OTHER): Payer: Medicare Other

## 2015-04-04 VITALS — BP 146/61 | HR 64 | Temp 98.6°F

## 2015-04-04 DIAGNOSIS — Z5112 Encounter for antineoplastic immunotherapy: Secondary | ICD-10-CM | POA: Diagnosis not present

## 2015-04-04 DIAGNOSIS — C9000 Multiple myeloma not having achieved remission: Secondary | ICD-10-CM

## 2015-04-04 MED ORDER — SODIUM CHLORIDE 0.9 % IV SOLN
Freq: Once | INTRAVENOUS | Status: AC
  Administered 2015-04-04: 15:00:00 via INTRAVENOUS

## 2015-04-04 MED ORDER — DEXTROSE 5 % IV SOLN: 36.0000 mg/m2 | Freq: Once | INTRAVENOUS | Status: DC

## 2015-04-04 MED ORDER — SODIUM CHLORIDE 0.9 % IV SOLN
Freq: Once | INTRAVENOUS | Status: AC
  Administered 2015-04-04: 16:00:00 via INTRAVENOUS

## 2015-04-04 MED ORDER — DEXTROSE 5 % IV SOLN
36.0000 mg/m2 | Freq: Once | INTRAVENOUS | Status: AC
  Administered 2015-04-04: 54 mg via INTRAVENOUS
  Filled 2015-04-04: qty 27

## 2015-04-04 MED ORDER — SODIUM CHLORIDE 0.9 % IV SOLN
Freq: Once | INTRAVENOUS | Status: AC
  Administered 2015-04-04: 15:00:00 via INTRAVENOUS
  Filled 2015-04-04: qty 4

## 2015-04-04 NOTE — Telephone Encounter (Signed)
S/w [name] per message below she thanked me for the information.

## 2015-04-04 NOTE — Patient Instructions (Signed)
[facility] Discharge Instructions for Patients Receiving Chemotherapy  Today you received the following chemotherapy agents Kyprolis.  To help prevent nausea and vomiting after your treatment, we encourage you to take your nausea medication as prescribed.   If you develop nausea and vomiting that is not controlled by your nausea medication, call the clinic.   BELOW ARE SYMPTOMS THAT SHOULD BE REPORTED IMMEDIATELY:  *FEVER GREATER THAN 100.5 F  *CHILLS WITH OR WITHOUT FEVER  NAUSEA AND VOMITING THAT IS NOT CONTROLLED WITH YOUR NAUSEA MEDICATION  *UNUSUAL SHORTNESS OF BREATH  *UNUSUAL BRUISING OR BLEEDING  TENDERNESS IN MOUTH AND THROAT WITH OR WITHOUT PRESENCE OF ULCERS  *URINARY PROBLEMS  *BOWEL PROBLEMS  UNUSUAL RASH Items with * indicate a potential emergency and should be followed up as soon as possible.  Feel free to call the clinic you have any questions or concerns. The clinic phone number is [phone].  Please show the CHEMO ALERT CARD at check-in to the Emergency Department and triage nurse.   

## 2015-04-04 NOTE — Telephone Encounter (Signed)
Called Dr. Garret Reddish Rosen's office [phone]. Patient will have a cyst removed from her jaw on 04/16/15 and he will be performing the surgery. Dr. Constance Holster and his RN are out of the office today. Left VM asking for instructions for anticoagulation around the procedure. Recording on VM states that all calls will be answered 8/4.

## 2015-04-05 ENCOUNTER — Encounter: Payer: Self-pay | Admitting: Pharmacist

## 2015-04-06 ENCOUNTER — Encounter: Payer: Self-pay | Admitting: Pharmacist

## 2015-04-06 ENCOUNTER — Other Ambulatory Visit: Payer: Self-pay | Admitting: Internal Medicine

## 2015-04-06 NOTE — Progress Notes (Signed)
Anticoagulation plan for procedure on 8/15:  [name] is having a cyst removed from her jaw area on Monday 8/15 and the physician performing the procedure is requesting holding coumadin x 3 days  Dr. Julien Nordmann is in agreement with this plan:  Hold coumadin x 3 days starting on Friday 8/12.  8/12: No coumadin  8/13: No Coumadin  8/14: No coumadin  8/15 - Day of procedure - No coumadin  Resume coumadin day after procedure on 8/16. NO lovenox bridge required per discussion with Dr. Julien Nordmann.

## 2015-04-09 ENCOUNTER — Other Ambulatory Visit: Payer: Self-pay | Admitting: Internal Medicine

## 2015-04-09 ENCOUNTER — Telehealth: Payer: Self-pay | Admitting: Medical Oncology

## 2015-04-09 NOTE — Telephone Encounter (Signed)
Refill called in. 

## 2015-04-10 ENCOUNTER — Other Ambulatory Visit: Payer: Medicare Other

## 2015-04-10 ENCOUNTER — Other Ambulatory Visit: Payer: Self-pay | Admitting: *Deleted

## 2015-04-10 ENCOUNTER — Ambulatory Visit: Payer: Medicare Other

## 2015-04-10 DIAGNOSIS — C9000 Multiple myeloma not having achieved remission: Secondary | ICD-10-CM

## 2015-04-10 MED ORDER — OXYCODONE HCL 5 MG PO TABS: ORAL_TABLET | ORAL | Status: DC

## 2015-04-10 NOTE — Telephone Encounter (Signed)
Refill Oxycodone 

## 2015-04-11 ENCOUNTER — Encounter (HOSPITAL_BASED_OUTPATIENT_CLINIC_OR_DEPARTMENT_OTHER): Payer: Self-pay | Admitting: *Deleted

## 2015-04-11 ENCOUNTER — Encounter: Payer: Self-pay | Admitting: Pharmacist

## 2015-04-11 NOTE — Progress Notes (Signed)
To to come in for BMET prior to surgery.  BMET from 8/2 done, Glucose was 412.  Pt and her daughter [name] were unaware of this result.  Daughter says that pt has misplaced her home meter and has not been checking her BS.  Advised patients daughter [name] to let [name]'s primary care doctor ( Dr [name]) know this as soon as possible so that they can make arrangements to get a working meter.

## 2015-04-11 NOTE — Progress Notes (Signed)
[facility] on 04/10/15 for lab and CC Called [name] today to review holding coumadin prior to her [name]) procedure on 04/16/15 [name] will hold coumadin Aug 12-15 and resume at her normal dose on 8/16 and we will see her on 04/24/15 during her infusion

## 2015-04-12 ENCOUNTER — Encounter (HOSPITAL_BASED_OUTPATIENT_CLINIC_OR_DEPARTMENT_OTHER)
Admission: RE | Admit: 2015-04-12 | Discharge: 2015-04-12 | Disposition: A | Discharge status: Home / Self Care | Payer: Medicare Other | Source: Ambulatory Visit | Attending: Otolaryngology | Admitting: Otolaryngology

## 2015-04-12 DIAGNOSIS — Z01818 Encounter for other preprocedural examination: Secondary | ICD-10-CM | POA: Insufficient documentation

## 2015-04-12 DIAGNOSIS — L989 Disorder of the skin and subcutaneous tissue, unspecified: Secondary | ICD-10-CM | POA: Diagnosis not present

## 2015-04-12 LAB — BASIC METABOLIC PANEL
ANION GAP: 11 (ref 5–15)
BUN: 40 mg/dL — ABNORMAL HIGH (ref 6–20)
CO2: 27 mmol/L (ref 22–32)
Calcium: 9.1 mg/dL (ref 8.9–10.3)
Chloride: 102 mmol/L (ref 101–111)
Creatinine, Ser: 1.3 mg/dL — ABNORMAL HIGH (ref 0.44–1.00)
GFR calc non Af Amer: 39 mL/min — ABNORMAL LOW (ref >60)
GFR, EST AFRICAN AMERICAN: 45 mL/min — AB (ref >60)
Glucose, Bld: 131 mg/dL — ABNORMAL HIGH (ref 65–99)
POTASSIUM: 2.9 mmol/L — AB (ref 3.5–5.1)
Sodium: 140 mmol/L (ref 135–145)

## 2015-04-13 ENCOUNTER — Ambulatory Visit: Payer: Self-pay | Admitting: Otolaryngology

## 2015-04-13 NOTE — Progress Notes (Signed)
Lab results reviewed by Dr Linna Caprice, verbal order to repeat I STAT and PT day of surgery, also to call pt. She is to increase her potassium pill to 3 per day from 2 per day.  Waiting for pt to call back.  Dr Constance Holster called and informed of labs and what changes had been.  He agreed with orders and had no further changes

## 2015-04-13 NOTE — Progress Notes (Signed)
Called left message with Dr Lew Dawes with lab result and med increase till day of surgery

## 2015-04-13 NOTE — Progress Notes (Signed)
Spoke to [employer] daughter of Mrs Chura,  Instructions given to increase potassium to 60 meq per day till day of surgery. Spoke to pharmacy to clarify med dose and times per day.  One 66mq bid,  Instructed daughter to increase to 231m three times a day till day of surgery.  She voiced understanding and repeated instructions correctly.  Also informed her to bring pt in 45 mins earlier day of surgery to have blood work repeated.  Both instructions were voice order from Dr JoLinna Caprice

## 2015-04-13 NOTE — H&P (Signed)
  Assessment  Myeloma (203.00) (C90.00). Diabetes (250.00) (E11.9). Epidermal cyst of face (706.2) (L72.0). Discussed  Probable epidermoid cyst, left side of face, very large. Given her myeloma, chemotherapy treatment and diabetes, she is at high risk for this becoming infected. Recommend surgical excision. Unfortunately given the location the incision would need to be along the mandible. I will talk with Dr. Earlie Server about timing this with respect to the chemotherapy treatments. Reason For Visit  Pt here for evaluation of cyst on left side of cheek area. HPI  At least a one year history of a cystic mass along the left side of the face that has fluctuated in size. CT scan was done a year ago which I have reviewed. She's currently undergoing chemotherapy for multiple myeloma. Current Meds  Acyclovir 400 MG Oral Tablet;; Qty60; R0; RPT. ALPRAZolam 0.5 MG Oral Tablet;; Qty60; R0; RPT. Dexamethasone 4 MG Oral Tablet;; Qty58; R0; RPT. GlipiZIDE ER 2.5 MG Oral Tablet Extended Release 24 Hour;; Qty90; R0; RPT. Lisinopril-Hydrochlorothiazide 20-12.5 MG Oral Tablet;; Qty45; R0; RPT. OxyCODONE HCl - 5 MG Oral Tablet;; Qty90; R0; RPT. Prochlorperazine Maleate 10 MG Oral Tablet;; Qty30; R0; RPT. Warfarin Sodium 5 MG Oral Tablet;; Qty60; R0; RPT. Potassium Chloride Crys ER 20 MEQ Oral Tablet Extended Release;; Qty10; R0; RPT. Temazepam 30 MG Oral Capsule;; Qty30; R0; RPT. Advil CAPS;; Qty0; R0; RPT. Calcicarb TABS;; Qty0; R0; RPT. Loperamide HCl LIQD;; Qty0; R0; RPT. Magic Mouthwash*;; Qty0; R0; RPT. Past Meds  Azithromycin 250 MG Oral Tablet;; Qty6; R0; RPT. Family Hx  No pertinent family history: Mother. ROS  12 system ROS was obtained and reviewed on the Health Maintenance form dated today.  Positive responses are shown above.  If the symptom is not checked, the patient has denied it. Vital Signs   Recorded by Sallyanne Kuster on 09 Mar 2015 01:48 PM BP:183/73,  BSA Calculated: 1.46 ,  BMI  Calculated: 21.87 ,  Weight: 112 lb , BMI: 21.9 kg/m2,  Height: 5 ft. Physical Exam  APPEARANCE: Well developed, well nourished, in no acute distress.  Normal affect, in a pleasant mood.  Oriented to time, place and person. COMMUNICATION: Normal voice   HEAD & FACE:  No scars, lesions or masses of head and face, except for a 4 or 5 cm doughy mass in the left mandibular subcutaneous area.  Sinuses nontender to palpation.  Salivary glands without mass or tenderness.  Facial strength symmetric.   EYES: EOMI with normal primary gaze alignment. Visual acuity grossly intact.  PERRLA EXTERNAL EAR & NOSE: No scars, lesions or masses  EAC & TYMPANIC MEMBRANE:  EAC shows no obstructing lesions or debris and tympanic membranes are normal bilaterally with good movement to insufflation. GROSS HEARING: Normal   TMJ:  Nontender  INTRANASAL EXAM: No polyps or purulence.  NASOPHARYNX: Normal, without lesions. LIPS, TEETH & GUMS: No lip lesions, edentulous and normal gums. ORAL CAVITY/OROPHARYNX:  Oral mucosa moist without lesion or asymmetry of the palate, tongue, tonsil or posterior pharynx. NECK:  Supple without adenopathy or mass. THYROID:  Normal with no masses palpable.  NEUROLOGIC:  No gross CN deficits. No nystagmus noted.   LYMPHATIC:  No enlarged nodes palpable. Signature  Electronically signed by : Izora Gala  M.D.; 03/09/2015 2:08 PM EST.

## 2015-04-16 ENCOUNTER — Other Ambulatory Visit: Payer: Self-pay | Admitting: Internal Medicine

## 2015-04-16 ENCOUNTER — Ambulatory Visit (HOSPITAL_BASED_OUTPATIENT_CLINIC_OR_DEPARTMENT_OTHER)
Admission: RE | Disposition: A | Discharge status: Home / Self Care | Payer: Medicare Other | Source: Ambulatory Visit | Attending: Otolaryngology

## 2015-04-16 ENCOUNTER — Encounter (HOSPITAL_BASED_OUTPATIENT_CLINIC_OR_DEPARTMENT_OTHER): Payer: Self-pay | Admitting: *Deleted

## 2015-04-16 ENCOUNTER — Ambulatory Visit (HOSPITAL_BASED_OUTPATIENT_CLINIC_OR_DEPARTMENT_OTHER)
Admission: RE | Admit: 2015-04-16 | Discharge: 2015-04-16 | Disposition: A | Discharge status: Home / Self Care | Payer: Medicare Other | Source: Ambulatory Visit | Attending: Otolaryngology | Admitting: Otolaryngology

## 2015-04-16 ENCOUNTER — Ambulatory Visit (HOSPITAL_BASED_OUTPATIENT_CLINIC_OR_DEPARTMENT_OTHER): Payer: Medicare Other | Admitting: Anesthesiology

## 2015-04-16 DIAGNOSIS — Z791 Long term (current) use of non-steroidal anti-inflammatories (NSAID): Secondary | ICD-10-CM | POA: Insufficient documentation

## 2015-04-16 DIAGNOSIS — D210 Benign neoplasm of connective and other soft tissue of head, face and neck: Secondary | ICD-10-CM | POA: Insufficient documentation

## 2015-04-16 DIAGNOSIS — Z87891 Personal history of nicotine dependence: Secondary | ICD-10-CM | POA: Diagnosis not present

## 2015-04-16 DIAGNOSIS — C9000 Multiple myeloma not having achieved remission: Secondary | ICD-10-CM | POA: Insufficient documentation

## 2015-04-16 DIAGNOSIS — Z7901 Long term (current) use of anticoagulants: Secondary | ICD-10-CM | POA: Insufficient documentation

## 2015-04-16 DIAGNOSIS — E119 Type 2 diabetes mellitus without complications: Secondary | ICD-10-CM | POA: Insufficient documentation

## 2015-04-16 DIAGNOSIS — I10 Essential (primary) hypertension: Secondary | ICD-10-CM | POA: Diagnosis not present

## 2015-04-16 DIAGNOSIS — Z79899 Other long term (current) drug therapy: Secondary | ICD-10-CM | POA: Diagnosis not present

## 2015-04-16 DIAGNOSIS — R220 Localized swelling, mass and lump, head: Secondary | ICD-10-CM | POA: Diagnosis not present

## 2015-04-16 DIAGNOSIS — Z79891 Long term (current) use of opiate analgesic: Secondary | ICD-10-CM | POA: Insufficient documentation

## 2015-04-16 DIAGNOSIS — D492 Neoplasm of unspecified behavior of bone, soft tissue, and skin: Secondary | ICD-10-CM | POA: Diagnosis not present

## 2015-04-16 DIAGNOSIS — L729 Follicular cyst of the skin and subcutaneous tissue, unspecified: Secondary | ICD-10-CM | POA: Diagnosis present

## 2015-04-16 DIAGNOSIS — Z792 Long term (current) use of antibiotics: Secondary | ICD-10-CM | POA: Insufficient documentation

## 2015-04-16 HISTORY — PX: EAR CYST EXCISION: SHX22

## 2015-04-16 LAB — POCT I-STAT, CHEM 8
BUN: 22 mg/dL — AB (ref 6–20)
CALCIUM ION: 1.28 mmol/L (ref 1.13–1.30)
CREATININE: 0.8 mg/dL (ref 0.44–1.00)
Chloride: 95 mmol/L — ABNORMAL LOW (ref 101–111)
GLUCOSE: 122 mg/dL — AB (ref 65–99)
HCT: 34 % — ABNORMAL LOW (ref 36.0–46.0)
Hemoglobin: 11.6 g/dL — ABNORMAL LOW (ref 12.0–15.0)
Potassium: 4.1 mmol/L (ref 3.5–5.1)
Sodium: 140 mmol/L (ref 135–145)
TCO2: 22 mmol/L (ref 0–100)

## 2015-04-16 LAB — PROTIME-INR
INR: 1.38 (ref 0.00–1.49)
Prothrombin Time: 17 seconds — ABNORMAL HIGH (ref 11.6–15.2)

## 2015-04-16 LAB — GLUCOSE, CAPILLARY: Glucose-Capillary: 122 mg/dL — ABNORMAL HIGH (ref 65–99)

## 2015-04-16 SURGERY — CYST REMOVAL
Anesthesia: General | Site: Face | Laterality: Left

## 2015-04-16 MED ORDER — MIDAZOLAM HCL 2 MG/2ML IJ SOLN
INTRAMUSCULAR | Status: AC
  Filled 2015-04-16: qty 2

## 2015-04-16 MED ORDER — GLYCOPYRROLATE 0.2 MG/ML IJ SOLN
0.2000 mg | Freq: Once | INTRAMUSCULAR | Status: DC | PRN
Start: 2015-04-16 — End: 2015-04-16

## 2015-04-16 MED ORDER — HYDROMORPHONE HCL 1 MG/ML IJ SOLN: 0.2500 mg | INTRAMUSCULAR | Status: DC | PRN

## 2015-04-16 MED ORDER — LIDOCAINE HCL (CARDIAC) 20 MG/ML IV SOLN
INTRAVENOUS | Status: DC | PRN
  Administered 2015-04-16: 40 mg via INTRAVENOUS

## 2015-04-16 MED ORDER — PROPOFOL 10 MG/ML IV BOLUS
INTRAVENOUS | Status: AC
  Filled 2015-04-16: qty 40

## 2015-04-16 MED ORDER — PROPOFOL 10 MG/ML IV BOLUS
INTRAVENOUS | Status: DC | PRN
  Administered 2015-04-16: 100 mg via INTRAVENOUS

## 2015-04-16 MED ORDER — FENTANYL CITRATE (PF) 100 MCG/2ML IJ SOLN
INTRAMUSCULAR | Status: DC | PRN
  Administered 2015-04-16: 25 ug via INTRAVENOUS
  Administered 2015-04-16: 50 ug via INTRAVENOUS

## 2015-04-16 MED ORDER — CEPHALEXIN 500 MG PO CAPS: 500.0000 mg | ORAL_CAPSULE | Freq: Three times a day (TID) | ORAL | Status: DC

## 2015-04-16 MED ORDER — EPHEDRINE SULFATE 50 MG/ML IJ SOLN
INTRAMUSCULAR | Status: DC | PRN
  Administered 2015-04-16: 10 mg via INTRAVENOUS

## 2015-04-16 MED ORDER — LIDOCAINE-EPINEPHRINE 1 %-1:100000 IJ SOLN
INTRAMUSCULAR | Status: AC
  Filled 2015-04-16: qty 1

## 2015-04-16 MED ORDER — CEFAZOLIN SODIUM-DEXTROSE 2-3 GM-% IV SOLR
2.0000 g | INTRAVENOUS | Status: AC
  Administered 2015-04-16: 2 g via INTRAVENOUS

## 2015-04-16 MED ORDER — DEXAMETHASONE SODIUM PHOSPHATE 4 MG/ML IJ SOLN
INTRAMUSCULAR | Status: DC | PRN
  Administered 2015-04-16: 10 mg via INTRAVENOUS

## 2015-04-16 MED ORDER — CEFAZOLIN SODIUM-DEXTROSE 2-3 GM-% IV SOLR
INTRAVENOUS | Status: AC
  Filled 2015-04-16: qty 50

## 2015-04-16 MED ORDER — LACTATED RINGERS IV SOLN
INTRAVENOUS | Status: DC
  Administered 2015-04-16: 11:00:00 via INTRAVENOUS

## 2015-04-16 MED ORDER — SCOPOLAMINE 1 MG/3DAYS TD PT72: 1.0000 | MEDICATED_PATCH | Freq: Once | TRANSDERMAL | Status: DC | PRN

## 2015-04-16 MED ORDER — OXYMETAZOLINE HCL 0.05 % NA SOLN
NASAL | Status: AC
  Filled 2015-04-16: qty 15

## 2015-04-16 MED ORDER — FENTANYL CITRATE (PF) 100 MCG/2ML IJ SOLN
INTRAMUSCULAR | Status: AC
  Filled 2015-04-16: qty 4

## 2015-04-16 MED ORDER — ONDANSETRON HCL 4 MG/2ML IJ SOLN: 4.0000 mg | Freq: Once | INTRAMUSCULAR | Status: DC | PRN

## 2015-04-16 MED ORDER — LIDOCAINE-EPINEPHRINE 1 %-1:100000 IJ SOLN
INTRAMUSCULAR | Status: DC | PRN
  Administered 2015-04-16: 1 mL

## 2015-04-16 MED ORDER — ONDANSETRON HCL 4 MG/2ML IJ SOLN
INTRAMUSCULAR | Status: DC | PRN
  Administered 2015-04-16: 4 mg via INTRAVENOUS

## 2015-04-16 SURGICAL SUPPLY — 34 items
BLADE SURG 15 STRL LF DISP TIS (BLADE) ×1 IMPLANT
BLADE SURG 15 STRL SS (BLADE) ×2
CANISTER SUCT 1200ML W/VALVE (MISCELLANEOUS) ×3 IMPLANT
CLEANER CAUTERY TIP 5X5 PAD (MISCELLANEOUS) ×1 IMPLANT
COVER BACK TABLE 60X90IN (DRAPES) ×3 IMPLANT
COVER MAYO STAND STRL (DRAPES) ×3 IMPLANT
DERMABOND ADVANCED (GAUZE/BANDAGES/DRESSINGS) ×2
DERMABOND ADVANCED .7 DNX12 (GAUZE/BANDAGES/DRESSINGS) ×1 IMPLANT
DRAPE U-SHAPE 76X120 STRL (DRAPES) ×3 IMPLANT
ELECT COATED BLADE 2.86 ST (ELECTRODE) ×3 IMPLANT
ELECT REM PT RETURN 9FT ADLT (ELECTROSURGICAL) ×3
ELECTRODE REM PT RTRN 9FT ADLT (ELECTROSURGICAL) ×1 IMPLANT
GLOVE BIO SURGEON STRL SZ 6.5 (GLOVE) ×2 IMPLANT
GLOVE BIO SURGEONS STRL SZ 6.5 (GLOVE) ×1
GLOVE BIOGEL PI IND STRL 7.0 (GLOVE) ×1 IMPLANT
GLOVE BIOGEL PI INDICATOR 7.0 (GLOVE) ×2
GLOVE ECLIPSE 7.5 STRL STRAW (GLOVE) ×3 IMPLANT
GOWN STRL REUS W/ TWL LRG LVL3 (GOWN DISPOSABLE) ×2 IMPLANT
GOWN STRL REUS W/TWL LRG LVL3 (GOWN DISPOSABLE) ×4
NEEDLE PRECISIONGLIDE 27X1.5 (NEEDLE) ×3 IMPLANT
NS IRRIG 1000ML POUR BTL (IV SOLUTION) ×3 IMPLANT
PACK BASIN DAY SURGERY FS (CUSTOM PROCEDURE TRAY) ×3 IMPLANT
PAD CLEANER CAUTERY TIP 5X5 (MISCELLANEOUS) ×2
PENCIL FOOT CONTROL (ELECTRODE) ×3 IMPLANT
SHEET MEDIUM DRAPE 40X70 STRL (DRAPES) ×3 IMPLANT
SLEEVE SCD COMPRESS KNEE MED (MISCELLANEOUS) ×3 IMPLANT
SUCTION FRAZIER TIP 10 FR DISP (SUCTIONS) ×3 IMPLANT
SUT CHROMIC 4 0 P 3 18 (SUTURE) ×6 IMPLANT
SYR BULB 3OZ (MISCELLANEOUS) ×3 IMPLANT
SYR CONTROL 10ML LL (SYRINGE) ×3 IMPLANT
TOWEL OR 17X24 6PK STRL BLUE (TOWEL DISPOSABLE) ×3 IMPLANT
TRAY DSU PREP LF (CUSTOM PROCEDURE TRAY) ×3 IMPLANT
TUBE CONNECTING 20'X1/4 (TUBING) ×1
TUBE CONNECTING 20X1/4 (TUBING) ×2 IMPLANT

## 2015-04-16 NOTE — H&P (View-Only) (Signed)
  Assessment  Myeloma (203.00) (C90.00). Diabetes (250.00) (E11.9). Epidermal cyst of face (706.2) (L72.0). Discussed  Probable epidermoid cyst, left side of face, very large. Given her myeloma, chemotherapy treatment and diabetes, she is at high risk for this becoming infected. Recommend surgical excision. Unfortunately given the location the incision would need to be along the mandible. I will talk with Dr. Earlie Server about timing this with respect to the chemotherapy treatments. Reason For Visit  Pt here for evaluation of cyst on left side of cheek area. HPI  At least a one year history of a cystic mass along the left side of the face that has fluctuated in size. CT scan was done a year ago which I have reviewed. She's currently undergoing chemotherapy for multiple myeloma. Current Meds  Acyclovir 400 MG Oral Tablet;; Qty60; R0; RPT. ALPRAZolam 0.5 MG Oral Tablet;; Qty60; R0; RPT. Dexamethasone 4 MG Oral Tablet;; Qty58; R0; RPT. GlipiZIDE ER 2.5 MG Oral Tablet Extended Release 24 Hour;; Qty90; R0; RPT. Lisinopril-Hydrochlorothiazide 20-12.5 MG Oral Tablet;; Qty45; R0; RPT. OxyCODONE HCl - 5 MG Oral Tablet;; Qty90; R0; RPT. Prochlorperazine Maleate 10 MG Oral Tablet;; Qty30; R0; RPT. Warfarin Sodium 5 MG Oral Tablet;; Qty60; R0; RPT. Potassium Chloride Crys ER 20 MEQ Oral Tablet Extended Release;; Qty10; R0; RPT. Temazepam 30 MG Oral Capsule;; Qty30; R0; RPT. Advil CAPS;; Qty0; R0; RPT. Calcicarb TABS;; Qty0; R0; RPT. Loperamide HCl LIQD;; Qty0; R0; RPT. Magic Mouthwash*;; Qty0; R0; RPT. Past Meds  Azithromycin 250 MG Oral Tablet;; Qty6; R0; RPT. Family Hx  No pertinent family history: Mother. ROS  12 system ROS was obtained and reviewed on the Health Maintenance form dated today.  Positive responses are shown above.  If the symptom is not checked, the patient has denied it. Vital Signs   Recorded by Sallyanne Kuster on 09 Mar 2015 01:48 PM BP:183/73,  BSA Calculated: 1.46 ,  BMI  Calculated: 21.87 ,  Weight: 112 lb , BMI: 21.9 kg/m2,  Height: 5 ft. Physical Exam  APPEARANCE: Well developed, well nourished, in no acute distress.  Normal affect, in a pleasant mood.  Oriented to time, place and person. COMMUNICATION: Normal voice   HEAD & FACE:  No scars, lesions or masses of head and face, except for a 4 or 5 cm doughy mass in the left mandibular subcutaneous area.  Sinuses nontender to palpation.  Salivary glands without mass or tenderness.  Facial strength symmetric.   EYES: EOMI with normal primary gaze alignment. Visual acuity grossly intact.  PERRLA EXTERNAL EAR & NOSE: No scars, lesions or masses  EAC & TYMPANIC MEMBRANE:  EAC shows no obstructing lesions or debris and tympanic membranes are normal bilaterally with good movement to insufflation. GROSS HEARING: Normal   TMJ:  Nontender  INTRANASAL EXAM: No polyps or purulence.  NASOPHARYNX: Normal, without lesions. LIPS, TEETH & GUMS: No lip lesions, edentulous and normal gums. ORAL CAVITY/OROPHARYNX:  Oral mucosa moist without lesion or asymmetry of the palate, tongue, tonsil or posterior pharynx. NECK:  Supple without adenopathy or mass. THYROID:  Normal with no masses palpable.  NEUROLOGIC:  No gross CN deficits. No nystagmus noted.   LYMPHATIC:  No enlarged nodes palpable. Signature  Electronically signed by : Izora Gala  M.D.; 03/09/2015 2:08 PM EST.

## 2015-04-16 NOTE — Anesthesia Preprocedure Evaluation (Signed)
Anesthesia Evaluation  Patient identified by MRN, date of birth, ID band Patient awake    Reviewed: Allergy & Precautions, NPO status , Patient's Chart, lab work & pertinent test results  Airway Mallampati: II  TM Distance: >3 FB Neck ROM: Full    Dental  (+) Edentulous Upper, Edentulous Lower   Pulmonary former smoker,  breath sounds clear to auscultation        Cardiovascular hypertension, Rhythm:Regular Rate:Normal     Neuro/Psych    GI/Hepatic   Endo/Other  diabetes  Renal/GU      Musculoskeletal   Abdominal   Peds  Hematology   Anesthesia Other Findings   Reproductive/Obstetrics                             Anesthesia Physical Anesthesia Plan  ASA: III  Anesthesia Plan: General   Post-op Pain Management:    Induction: Intravenous  Airway Management Planned: LMA  Additional Equipment:   Intra-op Plan:   Post-operative Plan:   Informed Consent: I have reviewed the patients History and Physical, chart, labs and discussed the procedure including the risks, benefits and alternatives for the proposed anesthesia with the patient or authorized representative who has indicated his/her understanding and acceptance.     Plan Discussed with: CRNA and Anesthesiologist  Anesthesia Plan Comments:         Anesthesia Quick Evaluation

## 2015-04-16 NOTE — Transfer of Care (Signed)
Immediate Anesthesia Transfer of Care Note  Patient: CATHELEEN LANGHORNE  Procedure(s) Performed: Procedure(s): EXCISION FACIAL CYST LEFT SIDE  (Left)  Patient Location: PACU  Anesthesia Type:General  Level of Consciousness: awake and patient cooperative  Airway & Oxygen Therapy: Patient Spontanous Breathing and Patient connected to face mask oxygen  Post-op Assessment: Report given to RN and Post -op Vital signs reviewed and stable  Post vital signs: Reviewed and stable  Last Vitals:  Filed Vitals:   04/16/15 1032  BP: 148/64  Pulse: 55  Temp: 36.8 C  Resp: 18    Complications: No apparent anesthesia complications

## 2015-04-16 NOTE — Anesthesia Postprocedure Evaluation (Signed)
  Anesthesia Post-op Note  Patient: [name]  Procedure(s) Performed: Procedure(s): EXCISION FACIAL CYST LEFT SIDE  (Left)  Patient Location: PACU  Anesthesia Type:General  Level of Consciousness: awake, alert  and oriented  Airway and Oxygen Therapy: Patient Spontanous Breathing and Patient connected to nasal cannula oxygen  Post-op Pain: mild  Post-op Assessment: Post-op Vital signs reviewed, Patient's Cardiovascular Status Stable, Respiratory Function Stable, Patent Airway and Pain level controlled              Post-op Vital Signs: stable  Last Vitals:  Filed Vitals:   04/16/15 1245  BP:   Pulse: 62  Temp:   Resp: 19    Complications: No apparent anesthesia complications

## 2015-04-16 NOTE — Op Note (Signed)
OPERATIVE REPORT  DATE OF SURGERY: 04/16/2015  PATIENT:  [name],  79 y.o. female  PRE-OPERATIVE DIAGNOSIS:  FACIAL CYST   POST-OPERATIVE DIAGNOSIS:  FACIAL MASS  PROCEDURE:  Procedure(s): EXCISION FACIAL CYST LEFT SIDE   SURGEON:  Beckie Salts, MD  ASSISTANTS:  none  ANESTHESIA:   General   EBL:   5 ml  DRAINS:  none  LOCAL MEDICATIONS USED:   1% Xylocaine with epinephrine  SPECIMEN:   Left facial mass  COUNTS:  Correct  PROCEDURE DETAILS: The patient was taken to the operating room and placed on the operating table in the supine position.  Following induction of general endotracheal anesthesia using laryngeal mask airway, the left lower face and upper neck were prepped and draped in standard fashion. A skin incision was outlined in elliptical fashion along the mandibular edge and was infiltrated with local and aesthetic solution. 15 scalpel was used to incise the skin and subcutaneous tissue. Electrocautery was used for hemostasis as needed. Sherman blunt dissection were then used to remove the entire mass. It was firm and rubbery, white in color, and approximately 7 or 8 cm in greater dimension. It was incised on the back table and was not fluid-filled but was filled with a dense rubbery white material. The wound was irrigated with saline. Closure was accomplished in layers using 4-0 chromic sutures. The skin was repaired approximately with Dermabond. Patient was awakened and extubated and transferred to recovery in stable condition.    PATIENT DISPOSITION:  To PACU, stable

## 2015-04-16 NOTE — Interval H&P Note (Signed)
History and Physical Interval Note:  04/16/2015 11:16 AM  [name]  has presented today for surgery, with the diagnosis of FACIAL CYST   The various methods of treatment have been discussed with the patient and family. After consideration of risks, benefits and other options for treatment, the patient has consented to  Procedure(s): EXCISION FACIAL CYST LEFT SIDE  (Left) as a surgical intervention .  [name] history has been reviewed, patient examined, no change in status, stable for surgery.  I have reviewed the patient's chart and labs.  Questions were answered to the patient's satisfaction.     Lindaann Gradilla

## 2015-04-16 NOTE — Discharge Instructions (Signed)
It is okay to shower and use soap and water but do not use any creams, oils or ointments.    Post Anesthesia Home Care Instructions  Activity: Get plenty of rest for the remainder of the day. A responsible adult should stay with you for 24 hours following the procedure.  For the next 24 hours, DO NOT: -Drive a car -Paediatric nurse -Drink alcoholic beverages -Take any medication unless instructed by your physician -Make any legal decisions or sign important papers.  Meals: Start with liquid foods such as gelatin or soup. Progress to regular foods as tolerated. Avoid greasy, spicy, heavy foods. If nausea and/or vomiting occur, drink only clear liquids until the nausea and/or vomiting subsides. Call your physician if vomiting continues.  Special Instructions/Symptoms: Your throat may feel dry or sore from the anesthesia or the breathing tube placed in your throat during surgery. If this causes discomfort, gargle with warm salt water. The discomfort should disappear within 24 hours.  If you had a scopolamine patch placed behind your ear for the management of post- operative nausea and/or vomiting:  1. The medication in the patch is effective for 72 hours, after which it should be removed.  Wrap patch in a tissue and discard in the trash. Wash hands thoroughly with soap and water. 2. You may remove the patch earlier than 72 hours if you experience unpleasant side effects which may include dry mouth, dizziness or visual disturbances. 3. Avoid touching the patch. Wash your hands with soap and water after contact with the patch.

## 2015-04-16 NOTE — Anesthesia Procedure Notes (Signed)
Procedure Name: LMA Insertion Date/Time: 04/16/2015 11:41 AM Performed by: Lyndee Leo Pre-anesthesia Checklist: Patient identified, Emergency Drugs available, Suction available and Patient being monitored Patient Re-evaluated:Patient Re-evaluated prior to inductionOxygen Delivery Method: Circle System Utilized Preoxygenation: Pre-oxygenation with 100% oxygen Intubation Type: IV induction Ventilation: Mask ventilation without difficulty LMA: LMA flexible inserted LMA Size: 4.0 Number of attempts: 1 Airway Equipment and Method: Bite block Placement Confirmation: positive ETCO2 Tube secured with: Tape Dental Injury: Teeth and Oropharynx as per pre-operative assessment

## 2015-04-17 ENCOUNTER — Encounter (HOSPITAL_BASED_OUTPATIENT_CLINIC_OR_DEPARTMENT_OTHER): Payer: Self-pay | Admitting: Otolaryngology

## 2015-04-23 ENCOUNTER — Telehealth: Payer: Self-pay | Admitting: *Deleted

## 2015-04-23 DIAGNOSIS — C9000 Multiple myeloma not having achieved remission: Secondary | ICD-10-CM

## 2015-04-23 MED ORDER — OXYCODONE HCL 5 MG PO TABS: ORAL_TABLET | ORAL | Status: DC

## 2015-04-23 NOTE — Telephone Encounter (Signed)
LEFT MESSAGE ON PT.'S DAUGHTER'S VOICE MAIL THAT HER MOTHER'S PRESCRIPTION IS READY AND MUST BE PICKED UP BY 4:30PM.

## 2015-04-24 ENCOUNTER — Encounter: Payer: Self-pay | Admitting: Internal Medicine

## 2015-04-24 ENCOUNTER — Other Ambulatory Visit (HOSPITAL_BASED_OUTPATIENT_CLINIC_OR_DEPARTMENT_OTHER): Payer: Medicare Other

## 2015-04-24 ENCOUNTER — Ambulatory Visit: Payer: Medicare Other

## 2015-04-24 ENCOUNTER — Telehealth: Payer: Self-pay | Admitting: Internal Medicine

## 2015-04-24 ENCOUNTER — Ambulatory Visit (HOSPITAL_BASED_OUTPATIENT_CLINIC_OR_DEPARTMENT_OTHER): Payer: Medicare Other | Admitting: Internal Medicine

## 2015-04-24 ENCOUNTER — Ambulatory Visit (HOSPITAL_BASED_OUTPATIENT_CLINIC_OR_DEPARTMENT_OTHER): Payer: Medicare Other | Admitting: Pharmacist

## 2015-04-24 VITALS — BP 129/59 | HR 79 | Temp 99.2°F | Resp 18 | Ht 60.0 in | Wt 114.6 lb

## 2015-04-24 DIAGNOSIS — I82401 Acute embolism and thrombosis of unspecified deep veins of right lower extremity: Secondary | ICD-10-CM

## 2015-04-24 DIAGNOSIS — C9000 Multiple myeloma not having achieved remission: Secondary | ICD-10-CM

## 2015-04-24 DIAGNOSIS — G8929 Other chronic pain: Secondary | ICD-10-CM | POA: Diagnosis not present

## 2015-04-24 DIAGNOSIS — I82402 Acute embolism and thrombosis of unspecified deep veins of left lower extremity: Secondary | ICD-10-CM

## 2015-04-24 LAB — COMPREHENSIVE METABOLIC PANEL (CC13)
ALBUMIN: 3.4 g/dL — AB (ref 3.5–5.0)
ALK PHOS: 63 U/L (ref 40–150)
ALT: 24 U/L (ref 0–55)
AST: 19 U/L (ref 5–34)
Anion Gap: 12 mEq/L — ABNORMAL HIGH (ref 3–11)
BUN: 21.4 mg/dL (ref 7.0–26.0)
CALCIUM: 8.9 mg/dL (ref 8.4–10.4)
CHLORIDE: 107 meq/L (ref 98–109)
CO2: 20 mEq/L — ABNORMAL LOW (ref 22–29)
Creatinine: 1 mg/dL (ref 0.6–1.1)
EGFR: 65 mL/min/{1.73_m2} — AB (ref >90)
Glucose: 264 mg/dl — ABNORMAL HIGH (ref 70–140)
POTASSIUM: 4.9 meq/L (ref 3.5–5.1)
Sodium: 139 mEq/L (ref 136–145)
Total Bilirubin: 0.26 mg/dL (ref 0.20–1.20)
Total Protein: 5.8 g/dL — ABNORMAL LOW (ref 6.4–8.3)

## 2015-04-24 LAB — CBC WITH DIFFERENTIAL/PLATELET
BASO%: 0 % (ref 0.0–2.0)
Basophils Absolute: 0 10e3/uL (ref 0.0–0.1)
EOS%: 0 % (ref 0.0–7.0)
Eosinophils Absolute: 0 10e3/uL (ref 0.0–0.5)
HCT: 34.2 % — ABNORMAL LOW (ref 34.8–46.6)
HGB: 10.9 g/dL — ABNORMAL LOW (ref 11.6–15.9)
LYMPH%: 4.5 % — ABNORMAL LOW (ref 14.0–49.7)
MCH: 31.1 pg (ref 25.1–34.0)
MCHC: 31.9 g/dL (ref 31.5–36.0)
MCV: 97.4 fL (ref 79.5–101.0)
MONO#: 0 10e3/uL — ABNORMAL LOW (ref 0.1–0.9)
MONO%: 0.7 % (ref 0.0–14.0)
NEUT#: 5.6 10e3/uL (ref 1.5–6.5)
NEUT%: 94.8 % — ABNORMAL HIGH (ref 38.4–76.8)
Platelets: 189 10e3/uL (ref 145–400)
RBC: 3.51 10e6/uL — ABNORMAL LOW (ref 3.70–5.45)
RDW: 15.5 % — ABNORMAL HIGH (ref 11.2–14.5)
WBC: 6 10e3/uL (ref 3.9–10.3)
lymph#: 0.3 10e3/uL — ABNORMAL LOW (ref 0.9–3.3)

## 2015-04-24 LAB — POCT INR: INR: 1.9

## 2015-04-24 LAB — PROTIME-INR
INR: 1.9 — ABNORMAL LOW (ref 2.00–3.50)
Protime: 22.8 s — ABNORMAL HIGH (ref 10.6–13.4)

## 2015-04-24 NOTE — Progress Notes (Signed)
INR right below goal of 2-3 today.  [name] not seen by coumadin clinic pharmacist since treatment held for 1 week s/p excision of facial cyst.  Coumadin was to have resumed on 8/16 after holding coumadin prior to procedure.  I have left a VM on [name] daughter, [name], phone to call with any questions regarding coumadin.  Otherwise will check PT/INR next week and f/u in infusion.

## 2015-04-24 NOTE — Progress Notes (Signed)
[place] Telephone:([phone]) [phone]   [phone]  OFFICE PROGRESS NOTE  [name], [place] [address]  DIAGNOSIS:  1. Multiple myeloma diagnosed in September 2009,  2. history of bilateral lower extremity deep vein thrombosis diagnosed in November 2009  3. acute on chronic deep vein thrombosis in the left lower extremity diagnosed in October 2010.   PRIOR THERAPY:  1. status post palliative radiotherapy to the right femur and ischail tuberosity, first was done in December 2011 and the second treatment was completed Jan 08 2011, and the third treatment to the right distal femur completed on 04/30/2011  2. status post palliative radiotherapy to the left proximal femur completed 02/28/2011.  3. Revlimid 25 mg by mouth daily for 21 days every 4 weeks in addition to oral Decadron at 40 mg by mouth on a weekly basis status post 40 cycles.  4. weekly subcutaneous Velcade 1.3 mg/M2 status post 60 cycles, discontinued today secondary to mild disease progression. 5. Pomalyst 4 mg by mouth daily for 21 days every 4 weeks, the patient started the first dose on 12/22/2012 , in addition to Decadron 40 mg weekly. Status post 13 cycles.  6. Chemotherapy according to the [facility] [id number] expanded access treatment protocol with Elotuzumab, lenalidomide and dexamthasone. Status post 5 cycles, discontinued today secondary to disease progression.   CURRENT THERAPY:  1. Systemic chemotherapy with Carfilzomib, Cytoxan and Decadron. First cycle 09/05/2014. Status post 8 cycles. 2. Coumadin followed by the Coumadin clinic at the [facility].  3. Zometa 4 mg IV given every 3 months.  INTERVAL HISTORY: [name] 79 y.o. female returns to the clinic today for follow-up visit accompanied by her daughter. The patient is tolerating her current treatment with Carfilzomib, Cytoxan and Decadron fairly well with no significant adverse effect fairly  well. She has been off treatment for several weeks in preparation for a surgical resection of questionable chest cyst from the left side of her face which was performed on 04/16/2015. The final pathology is still pending. She continues to have low back pain and MRI of the lumbar spine showed degenerative disc disease with no concerning nerve or cord compression. She is currently on pain medication with oxycodone as needed. She denied having any significant weight loss or night sweats. She has no nausea or vomiting. The patient denied having any significant chest pain, shortness of breath, cough or hemoptysis. She is here today for evaluation and discussion of resuming her systemic therapy.  MEDICAL HISTORY: Past Medical History  Diagnosis Date  . Blood transfusion   . Depression     Mild  . History of chicken pox   . Hyperlipidemia   . Hypertension   . Degenerative joint disease   . Phlebitis   . Multiple myeloma     per Dr. Julien Nordmann, s/p palliative radiation for leg pain 2011 per Dr. Sondra Come  . Deep vein thrombosis of bilateral lower extremities   . Family history of anesthesia complication     DAUGHTER CPR AFTER  . Cardiomyopathy   . Diabetes mellitus     Steroid related  . UTI (urinary tract infection) 09/08/2013  . History of radiation therapy 08/30/13-09/20/13    20 gray to lower lumbar/upper sacrum    ALLERGIES:  has No Known Allergies.  MEDICATIONS:  Current Outpatient Prescriptions  Medication Sig Dispense Refill  . acyclovir (ZOVIRAX) 400 MG tablet Take 1 tablet (400 mg total) by mouth 2 (  two) times daily. 60 tablet 0  . ALPRAZolam (XANAX) 0.5 MG tablet TAKE ONE TABLET BY MOUTH TWICE DAILY AS NEEDED FOR ANXIETY 60 tablet 0  . Calcium Carbonate-Vitamin D 600-400 MG-UNIT per tablet Take 1 tablet by mouth 3 (three) times daily with meals.      Marland Kitchen dexamethasone (DECADRON) 4 MG tablet Take 10 tablets (40 mg) weekly on day 1  with Chemotherapy treatment Carfilzomib. 40 tablet 1  .  glipiZIDE (GLUCOTROL XL) 2.5 MG 24 hr tablet Take 2.5 mg by mouth daily with breakfast.    . lisinopril-hydrochlorothiazide (PRINZIDE,ZESTORETIC) 20-12.5 MG per tablet Take 1 tablet by mouth daily. 90 tablet 3  . loperamide (IMODIUM A-D) 2 MG tablet Take 1 tablet (2 mg total) by mouth 4 (four) times daily as needed for diarrhea or loose stools.    Marland Kitchen oxyCODONE (OXY IR/ROXICODONE) 5 MG immediate release tablet Take  1-2 tablets ($RemoveBefo'10mg'VVpyxsYgDuj$  total) by mouth every 6 hours as needed for severe pain 90 tablet 0  . potassium chloride SA (K-DUR,KLOR-CON) 20 MEQ tablet Take 1 tablet (20 mEq total) by mouth 2 (two) times daily. 14 tablet 0  . potassium chloride SA (K-DUR,KLOR-CON) 20 MEQ tablet TAKE ONE TABLET BY MOUTH TWICE DAILY 20 tablet 0  . prochlorperazine (COMPAZINE) 10 MG tablet Take 1 tablet (10 mg total) by mouth every 6 (six) hours as needed. 30 tablet 0  . temazepam (RESTORIL) 30 MG capsule Take 1 capsule (30 mg total) by mouth at bedtime as needed. 30 capsule 0  . warfarin (COUMADIN) 5 MG tablet TAKE 1 TAB BY MOUTH AS DIRECTED 60 tablet 0   No current facility-administered medications for this visit.   Facility-Administered Medications Ordered in Other Visits  Medication Dose Route Frequency Provider Last Rate Last Dose  . 0.9 %  sodium chloride infusion   Intravenous Once Curt Bears, MD   Stopped at 03/21/15 1720    SURGICAL HISTORY:  Past Surgical History  Procedure Laterality Date  . Abdominal hysterectomy    . Ankle fracture surgery Left   . Ear cyst excision Left 04/16/2015    Procedure: EXCISION FACIAL CYST LEFT SIDE ;  Surgeon: Izora Gala, MD;  Location: [facility];  Service: ENT;  Laterality: Left;    REVIEW OF SYSTEMS:  Constitutional: positive for fatigue Eyes: negative Ears, nose, mouth, throat, and face: negative Respiratory: negative Cardiovascular: negative Gastrointestinal: negative Genitourinary:negative Integument/breast:  negative Hematologic/lymphatic: negative Musculoskeletal:positive for bone pain Neurological: negative Behavioral/Psych: negative Endocrine: negative Allergic/Immunologic: negative   PHYSICAL EXAMINATION: General appearance: alert, cooperative and no distress Head: Normocephalic, without obvious abnormality, atraumatic Neck: no adenopathy, no JVD, supple, symmetrical, trachea midline and thyroid not enlarged, symmetric, no tenderness/mass/nodules Lymph nodes: Cervical, supraclavicular, and axillary nodes normal. Resp: clear to auscultation bilaterally Back: symmetric, no curvature. ROM normal. No CVA tenderness. Cardio: regular rate and rhythm, S1, S2 normal, no murmur, click, rub or gallop GI: soft, non-tender; bowel sounds normal; no masses,  no organomegaly Extremities: extremities normal, atraumatic, no cyanosis or edema Neurologic: Alert and oriented X 3, normal strength and tone. Normal symmetric reflexes. Normal coordination and gait  ECOG PERFORMANCE STATUS: 1 - Symptomatic but completely ambulatory  Blood pressure 129/59, pulse 79, temperature 99.2 F (37.3 C), temperature source Oral, resp. rate 18, height 5' (1.524 m), weight 114 lb 9.6 oz (51.982 kg), SpO2 99 %.  LABORATORY DATA: Lab Results  Component Value Date   WBC 6.0 04/24/2015   HGB 10.9* 04/24/2015   HCT 34.2* 04/24/2015  MCV 97.4 04/24/2015   PLT 189 04/24/2015      Chemistry      Component Value Date/Time   NA 140 04/16/2015 1039   NA 140 04/03/2015 1438   K 4.1 04/16/2015 1039   K 4.0 04/03/2015 1438   CL 95* 04/16/2015 1039   CL 107 02/14/2013 1311   CO2 27 04/12/2015 1645   CO2 21* 04/03/2015 1438   BUN 22* 04/16/2015 1039   BUN 24.8 04/03/2015 1438   CREATININE 0.80 04/16/2015 1039   CREATININE 1.2* 04/03/2015 1438      Component Value Date/Time   CALCIUM 9.1 04/12/2015 1645   CALCIUM 9.1 04/03/2015 1438   CALCIUM * 06/13/2008 1605    5.7 CRITICAL RESULT CALLED TO, READ BACK BY AND  VERIFIED WITH: JAMIE TRACY,RN [id number] @ [address] [place] (NOTE)  Amended report. Result repeated and verified. CORRECTED ON 10/14 AT 1429: PREVIOUSLY REPORTED AS Result repeated and verified.   ALKPHOS 57 04/03/2015 1438   ALKPHOS 49 09/19/2014 1447   AST 16 04/03/2015 1438   AST 18 09/19/2014 1447   ALT 22 04/03/2015 1438   ALT 24 09/19/2014 1447   BILITOT 0.34 04/03/2015 1438   BILITOT 0.4 09/19/2014 1447      RADIOGRAPHIC STUDIES: Mr Lumbar Spine W Wo Contrast  03/29/2015   CLINICAL DATA:  Increased low back pain and bilateral leg pain for 4 weeks. History of multiple myeloma.  EXAM: MRI LUMBAR SPINE WITHOUT AND WITH CONTRAST  TECHNIQUE: Multiplanar and multiecho pulse sequences of the lumbar spine were obtained without and with intravenous contrast.  CONTRAST:  77mL MULTIHANCE GADOBENATE DIMEGLUMINE 529 MG/ML IV SOLN  COMPARISON:  04/07/2013  FINDINGS: Vertebral alignment is unchanged with mild reversal of the normal lumbar lordosis. There is a new L1 compression fracture with approximately 50% height loss and with associated mild marrow enhancement and at most minimal marrow edema. There is no retropulsion. There is a new, minimal L2 superior endplate compression fracture with at most 10% height loss and no edema.  Chronic, moderate L3 and severe L4 compression fractures do not appear significantly changed. Abnormal marrow signal in the left lateral aspect of the L5 vertebral body is unchanged. T2 hyperintense, peripherally enhancing lesion occupying the S1 vertebral body is unchanged.  There is diffuse lumbar disc desiccation. Vacuum disc phenomenon is present from L2-3 to L5-S1, and there is moderate to severe disc space narrowing at L4-5, not significantly changed. The conus medullaris terminates at L1 and is normal in signal.  12 mm T2 hyperintense lesion in the right hepatic lobe is unchanged, likely a cyst. Mild extrahepatic biliary and pancreatic ductal dilatation is again seen. Tiny  bilateral renal cysts are noted.  T11-12: Only imaged sagittally. Mild disc bulging and right greater than left facet hypertrophy result in borderline to mild spinal stenosis and moderate right and mild left neural foraminal stenosis, unchanged.  T12-L1: Mild disc bulging and moderate right and mild left facet hypertrophy without significant stenosis, unchanged.  L1-2: Disc bulging and mild facet hypertrophy result in mild bilateral neural foraminal stenosis and mild left lateral recess stenosis, mildly increased from prior. No significant spinal stenosis.  L2-3: Moderate circumferential disc bulging and mild facet and ligamentum flavum hypertrophy result in mild-to-moderate spinal stenosis and mild-to-moderate bilateral neural foraminal stenosis, unchanged.  L3-4: Moderate disc bulging asymmetric to the left and facet and ligamentum flavum hypertrophy result in mild-to-moderate spinal stenosis and mild right and moderate to severe left neural foraminal stenosis, unchanged.  L4-5: Moderate circumferential disc bulging mildly asymmetric to the left and mild facet and ligamentum flavum hypertrophy result in moderate left lateral recess stenosis and mild-to-moderate bilateral neural foraminal stenosis without spinal stenosis, unchanged.  L5-S1: Mild disc bulging asymmetric to the left and mild facet hypertrophy result in mild left lateral recess stenosis and mild-to-moderate right and moderate to severe left neural foraminal stenosis without spinal stenosis, unchanged.  IMPRESSION: 1. Moderate L1 and mild L2 compression fractures, new from 2014 but chronic in appearance. 2. Progressive disc degeneration at L1-2 with mild bilateral foraminal and left lateral recess stenosis. 3. Unchanged appearance of the lumbar spine elsewhere.   Electronically Signed   By: Logan Bores   On: 03/29/2015 17:53    ASSESSMENT AND PLAN: This is a very pleasant 79 years old African-American female with history of multiple myeloma status  post several chemotherapy regimens lastly including systemic chemotherapy according to the [facility] [place] 204143 clinical trial with Elotuzumab, Revlimid and dexamethasone. She status post 5 cycles and tolerating her treatment fairly well. This was discontinued secondary to disease progression. Unfortunately the recent myeloma panel showed significant increase in the free lambda light chain. I discussed the lab result with the patient and her daughters. She completed treatment with Elotuzumab, Revlimid and dexamethasone but this was discontinued secondary to disease progression. The patient was started on treatment with systemic chemotherapy with Carfilzomib, Cytoxan and dexamethasone as salvage therapy. She is status post 8 cycles and tolerating this treatment fairly well.  I recommended for the patient to resume her treatment starting next week to give her more time to recover from the recent surgery. For the left face sebaceous cyst, she had excision of the cyst on 04/16/2015 but the final pathology is still pending. For the low back pain, the patient was given a refill of oxycodone. She would come back for follow-up visit in one month's for reevaluation before starting cycle #10. She was advised to call immediately if she has any concerning symptoms in the interval. The patient voices understanding of current disease status and treatment options and is in agreement with the current care plan.  All questions were answered. The patient knows to call the clinic with any problems, questions or concerns. We can certainly see the patient much sooner if necessary.  Disclaimer: This note was dictated with voice recognition software. Similar sounding words can inadvertently be transcribed and may not be corrected upon review.

## 2015-04-24 NOTE — Telephone Encounter (Signed)
Gave and printed appt sched and avs for pt for Aug and Sept

## 2015-04-25 ENCOUNTER — Ambulatory Visit: Payer: Medicare Other

## 2015-04-30 ENCOUNTER — Encounter (HOSPITAL_COMMUNITY): Payer: Self-pay

## 2015-05-01 ENCOUNTER — Other Ambulatory Visit (HOSPITAL_BASED_OUTPATIENT_CLINIC_OR_DEPARTMENT_OTHER): Payer: Medicare Other

## 2015-05-01 ENCOUNTER — Ambulatory Visit (HOSPITAL_BASED_OUTPATIENT_CLINIC_OR_DEPARTMENT_OTHER): Payer: Medicare Other

## 2015-05-01 ENCOUNTER — Ambulatory Visit (HOSPITAL_BASED_OUTPATIENT_CLINIC_OR_DEPARTMENT_OTHER): Payer: Medicare Other | Admitting: Pharmacist

## 2015-05-01 VITALS — BP 161/54 | HR 63 | Temp 98.7°F | Resp 18

## 2015-05-01 DIAGNOSIS — Z5111 Encounter for antineoplastic chemotherapy: Secondary | ICD-10-CM

## 2015-05-01 DIAGNOSIS — C9000 Multiple myeloma not having achieved remission: Secondary | ICD-10-CM

## 2015-05-01 DIAGNOSIS — Z5112 Encounter for antineoplastic immunotherapy: Secondary | ICD-10-CM | POA: Diagnosis not present

## 2015-05-01 DIAGNOSIS — I82401 Acute embolism and thrombosis of unspecified deep veins of right lower extremity: Secondary | ICD-10-CM

## 2015-05-01 DIAGNOSIS — I82402 Acute embolism and thrombosis of unspecified deep veins of left lower extremity: Secondary | ICD-10-CM

## 2015-05-01 LAB — CBC WITH DIFFERENTIAL/PLATELET
BASO%: 0.1 % (ref 0.0–2.0)
BASOS ABS: 0 10*3/uL (ref 0.0–0.1)
EOS%: 0.1 % (ref 0.0–7.0)
Eosinophils Absolute: 0 10*3/uL (ref 0.0–0.5)
HEMATOCRIT: 33.3 % — AB (ref 34.8–46.6)
HEMOGLOBIN: 10.7 g/dL — AB (ref 11.6–15.9)
LYMPH#: 0.1 10*3/uL — AB (ref 0.9–3.3)
LYMPH%: 3.2 % — ABNORMAL LOW (ref 14.0–49.7)
MCH: 30.9 pg (ref 25.1–34.0)
MCHC: 32.2 g/dL (ref 31.5–36.0)
MCV: 96.1 fL (ref 79.5–101.0)
MONO#: 0 10*3/uL — AB (ref 0.1–0.9)
MONO%: 0.7 % (ref 0.0–14.0)
NEUT#: 4.5 10*3/uL (ref 1.5–6.5)
NEUT%: 95.9 % — ABNORMAL HIGH (ref 38.4–76.8)
Platelets: 199 10*3/uL (ref 145–400)
RBC: 3.47 10*6/uL — ABNORMAL LOW (ref 3.70–5.45)
RDW: 16.3 % — AB (ref 11.2–14.5)
WBC: 4.7 10*3/uL (ref 3.9–10.3)

## 2015-05-01 LAB — COMPREHENSIVE METABOLIC PANEL (CC13)
ALT: 24 U/L (ref 0–55)
AST: 21 U/L (ref 5–34)
Albumin: 3.4 g/dL — ABNORMAL LOW (ref 3.5–5.0)
Alkaline Phosphatase: 56 U/L (ref 40–150)
Anion Gap: 11 mEq/L (ref 3–11)
BUN: 27.5 mg/dL — AB (ref 7.0–26.0)
CHLORIDE: 108 meq/L (ref 98–109)
CO2: 20 meq/L — AB (ref 22–29)
CREATININE: 1.1 mg/dL (ref 0.6–1.1)
Calcium: 9.5 mg/dL (ref 8.4–10.4)
EGFR: 55 mL/min/{1.73_m2} — ABNORMAL LOW (ref >90)
GLUCOSE: 219 mg/dL — AB (ref 70–140)
Potassium: 4.5 mEq/L (ref 3.5–5.1)
Sodium: 140 mEq/L (ref 136–145)
Total Bilirubin: 0.29 mg/dL (ref 0.20–1.20)
Total Protein: 6 g/dL — ABNORMAL LOW (ref 6.4–8.3)

## 2015-05-01 LAB — POCT INR: INR: 1.7

## 2015-05-01 LAB — PROTIME-INR
INR: 1.7 — AB (ref 2.00–3.50)
Protime: 20.4 Seconds — ABNORMAL HIGH (ref 10.6–13.4)

## 2015-05-01 MED ORDER — SODIUM CHLORIDE 0.9 % IV SOLN
300.0000 mg/m2 | Freq: Once | INTRAVENOUS | Status: AC
  Administered 2015-05-01: 440 mg via INTRAVENOUS
  Filled 2015-05-01: qty 22

## 2015-05-01 MED ORDER — SODIUM CHLORIDE 0.9 % IV SOLN: Freq: Once | INTRAVENOUS | Status: DC

## 2015-05-01 MED ORDER — DEXTROSE 5 % IV SOLN
36.0000 mg/m2 | Freq: Once | INTRAVENOUS | Status: AC
  Administered 2015-05-01: 54 mg via INTRAVENOUS
  Filled 2015-05-01: qty 27

## 2015-05-01 MED ORDER — DEXAMETHASONE SODIUM PHOSPHATE 100 MG/10ML IJ SOLN
Freq: Once | INTRAMUSCULAR | Status: AC
  Administered 2015-05-01: 15:00:00 via INTRAVENOUS
  Filled 2015-05-01: qty 4

## 2015-05-01 MED ORDER — SODIUM CHLORIDE 0.9 % IV SOLN
Freq: Once | INTRAVENOUS | Status: AC
  Administered 2015-05-01: 15:00:00 via INTRAVENOUS

## 2015-05-01 NOTE — Patient Instructions (Signed)
[facility] Discharge Instructions for Patients Receiving Chemotherapy  Today you received the following chemotherapy agents: cytoxan, kyprolis  To help prevent nausea and vomiting after your treatment, we encourage you to take your nausea medication.  Take it as often as prescribed.     If you develop nausea and vomiting that is not controlled by your nausea medication, call the clinic. If it is after clinic hours your family physician or the after hours number for the clinic or go to the Emergency Department.   BELOW ARE SYMPTOMS THAT SHOULD BE REPORTED IMMEDIATELY:  *FEVER GREATER THAN 100.5 F  *CHILLS WITH OR WITHOUT FEVER  NAUSEA AND VOMITING THAT IS NOT CONTROLLED WITH YOUR NAUSEA MEDICATION  *UNUSUAL SHORTNESS OF BREATH  *UNUSUAL BRUISING OR BLEEDING  TENDERNESS IN MOUTH AND THROAT WITH OR WITHOUT PRESENCE OF ULCERS  *URINARY PROBLEMS  *BOWEL PROBLEMS  UNUSUAL RASH Items with * indicate a potential emergency and should be followed up as soon as possible.  Feel free to call the clinic you have any questions or concerns. The clinic phone number is [phone].   I have been informed and understand all the instructions given to me. I know to contact the clinic, my physician, or go to the Emergency Department if any problems should occur. I do not have any questions at this time, but understand that I may call the clinic during office hours   should I have any questions or need assistance in obtaining follow up care.    __________________________________________  _____________  __________ Signature of Patient or Authorized Representative            Date                   Time    __________________________________________ Nurse's Signature    

## 2015-05-01 NOTE — Progress Notes (Signed)
INR below goal today.  No missed doses.  No changes in meds.  No bleeding/bruising.  Will have Ms Betzler take '5mg'$  tomorrow instead of 2.'5mg'$ .  Her coumadin dose this week will be '5mg'$  Tu/Wed/Th/Sat and 2.'5mg'$  other days.  Will check PT/INR next week with next infusion.  She is receiving Kyprolis/CTX today.

## 2015-05-02 ENCOUNTER — Ambulatory Visit (HOSPITAL_BASED_OUTPATIENT_CLINIC_OR_DEPARTMENT_OTHER): Payer: Medicare Other

## 2015-05-02 VITALS — BP 147/77 | HR 58 | Temp 98.7°F | Resp 17

## 2015-05-02 DIAGNOSIS — Z5112 Encounter for antineoplastic immunotherapy: Secondary | ICD-10-CM

## 2015-05-02 DIAGNOSIS — C9000 Multiple myeloma not having achieved remission: Secondary | ICD-10-CM | POA: Diagnosis not present

## 2015-05-02 MED ORDER — SODIUM CHLORIDE 0.9 % IV SOLN
Freq: Once | INTRAVENOUS | Status: AC
  Administered 2015-05-02: 16:00:00 via INTRAVENOUS

## 2015-05-02 MED ORDER — SODIUM CHLORIDE 0.9 % IV SOLN
Freq: Once | INTRAVENOUS | Status: AC
  Administered 2015-05-02: 17:00:00 via INTRAVENOUS
  Filled 2015-05-02: qty 4

## 2015-05-02 MED ORDER — DEXTROSE 5 % IV SOLN
36.0000 mg/m2 | Freq: Once | INTRAVENOUS | Status: AC
  Administered 2015-05-02: 54 mg via INTRAVENOUS
  Filled 2015-05-02: qty 27

## 2015-05-02 NOTE — Patient Instructions (Signed)
[facility] Discharge Instructions for Patients Receiving Chemotherapy  Today you received the following chemotherapy agents: Kyprolis   To help prevent nausea and vomiting after your treatment, we encourage you to take your nausea medication as directed.    If you develop nausea and vomiting that is not controlled by your nausea medication, call the clinic.   BELOW ARE SYMPTOMS THAT SHOULD BE REPORTED IMMEDIATELY:  *FEVER GREATER THAN 100.5 F  *CHILLS WITH OR WITHOUT FEVER  NAUSEA AND VOMITING THAT IS NOT CONTROLLED WITH YOUR NAUSEA MEDICATION  *UNUSUAL SHORTNESS OF BREATH  *UNUSUAL BRUISING OR BLEEDING  TENDERNESS IN MOUTH AND THROAT WITH OR WITHOUT PRESENCE OF ULCERS  *URINARY PROBLEMS  *BOWEL PROBLEMS  UNUSUAL RASH Items with * indicate a potential emergency and should be followed up as soon as possible.  Feel free to call the clinic you have any questions or concerns. The clinic phone number is [phone].  Please show the CHEMO ALERT CARD at check-in to the Emergency Department and triage nurse.   

## 2015-05-04 ENCOUNTER — Other Ambulatory Visit: Payer: Self-pay | Admitting: *Deleted

## 2015-05-04 DIAGNOSIS — C9000 Multiple myeloma not having achieved remission: Secondary | ICD-10-CM

## 2015-05-04 MED ORDER — OXYCODONE HCL 5 MG PO TABS: ORAL_TABLET | ORAL | Status: DC

## 2015-05-04 NOTE — Telephone Encounter (Signed)
Message transferred from Triage: Pt daughter called [facility] with pain med refill request. oxycodone '5mg'$  1-2 q 6hr. Last filled 8/22. Reviewed with Burnetta Sabin, [place]

## 2015-05-08 ENCOUNTER — Telehealth: Payer: Self-pay | Admitting: *Deleted

## 2015-05-08 ENCOUNTER — Ambulatory Visit (HOSPITAL_BASED_OUTPATIENT_CLINIC_OR_DEPARTMENT_OTHER): Payer: Medicare Other | Admitting: Pharmacist

## 2015-05-08 ENCOUNTER — Other Ambulatory Visit (HOSPITAL_BASED_OUTPATIENT_CLINIC_OR_DEPARTMENT_OTHER): Payer: Medicare Other

## 2015-05-08 ENCOUNTER — Ambulatory Visit (HOSPITAL_BASED_OUTPATIENT_CLINIC_OR_DEPARTMENT_OTHER): Payer: Medicare Other

## 2015-05-08 ENCOUNTER — Other Ambulatory Visit: Payer: Self-pay | Admitting: Internal Medicine

## 2015-05-08 VITALS — BP 138/68 | HR 74 | Temp 98.4°F | Resp 16

## 2015-05-08 DIAGNOSIS — I82401 Acute embolism and thrombosis of unspecified deep veins of right lower extremity: Secondary | ICD-10-CM | POA: Diagnosis not present

## 2015-05-08 DIAGNOSIS — Z5112 Encounter for antineoplastic immunotherapy: Secondary | ICD-10-CM | POA: Diagnosis not present

## 2015-05-08 DIAGNOSIS — I82402 Acute embolism and thrombosis of unspecified deep veins of left lower extremity: Secondary | ICD-10-CM

## 2015-05-08 DIAGNOSIS — C9000 Multiple myeloma not having achieved remission: Secondary | ICD-10-CM

## 2015-05-08 DIAGNOSIS — Z5111 Encounter for antineoplastic chemotherapy: Secondary | ICD-10-CM

## 2015-05-08 LAB — COMPREHENSIVE METABOLIC PANEL (CC13)
ALT: 16 U/L (ref 0–55)
ANION GAP: 9 meq/L (ref 3–11)
AST: 16 U/L (ref 5–34)
Albumin: 3.2 g/dL — ABNORMAL LOW (ref 3.5–5.0)
Alkaline Phosphatase: 51 U/L (ref 40–150)
BILIRUBIN TOTAL: 0.22 mg/dL (ref 0.20–1.20)
BUN: 31 mg/dL — AB (ref 7.0–26.0)
CALCIUM: 9.2 mg/dL (ref 8.4–10.4)
CHLORIDE: 109 meq/L (ref 98–109)
CO2: 21 meq/L — AB (ref 22–29)
CREATININE: 1 mg/dL (ref 0.6–1.1)
EGFR: 63 mL/min/{1.73_m2} — ABNORMAL LOW (ref >90)
Glucose: 276 mg/dl — ABNORMAL HIGH (ref 70–140)
Potassium: 4.5 mEq/L (ref 3.5–5.1)
Sodium: 139 mEq/L (ref 136–145)
TOTAL PROTEIN: 5.8 g/dL — AB (ref 6.4–8.3)

## 2015-05-08 LAB — PROTIME-INR
INR: 1.3 — ABNORMAL LOW (ref 2.00–3.50)
Protime: 15.6 Seconds — ABNORMAL HIGH (ref 10.6–13.4)

## 2015-05-08 LAB — CBC WITH DIFFERENTIAL/PLATELET
BASO%: 0.4 % (ref 0.0–2.0)
BASOS ABS: 0 10*3/uL (ref 0.0–0.1)
EOS ABS: 0 10*3/uL (ref 0.0–0.5)
EOS%: 0 % (ref 0.0–7.0)
HEMATOCRIT: 31.9 % — AB (ref 34.8–46.6)
HEMOGLOBIN: 10.3 g/dL — AB (ref 11.6–15.9)
LYMPH#: 0.1 10*3/uL — AB (ref 0.9–3.3)
LYMPH%: 1.7 % — ABNORMAL LOW (ref 14.0–49.7)
MCH: 31.1 pg (ref 25.1–34.0)
MCHC: 32.4 g/dL (ref 31.5–36.0)
MCV: 95.9 fL (ref 79.5–101.0)
MONO#: 0 10*3/uL — AB (ref 0.1–0.9)
MONO%: 0.6 % (ref 0.0–14.0)
NEUT#: 7.7 10*3/uL — ABNORMAL HIGH (ref 1.5–6.5)
NEUT%: 97.3 % — AB (ref 38.4–76.8)
PLATELETS: 196 10*3/uL (ref 145–400)
RBC: 3.33 10*6/uL — ABNORMAL LOW (ref 3.70–5.45)
RDW: 16.1 % — AB (ref 11.2–14.5)
WBC: 7.9 10*3/uL (ref 3.9–10.3)

## 2015-05-08 LAB — POCT INR: INR: 1.3

## 2015-05-08 MED ORDER — SODIUM CHLORIDE 0.9 % IV SOLN: Freq: Once | INTRAVENOUS | Status: DC

## 2015-05-08 MED ORDER — SODIUM CHLORIDE 0.9 % IV SOLN
Freq: Once | INTRAVENOUS | Status: AC
  Administered 2015-05-08: 15:00:00 via INTRAVENOUS
  Filled 2015-05-08: qty 4

## 2015-05-08 MED ORDER — CARFILZOMIB CHEMO INJECTION 60 MG: 36.0000 mg/m2 | Freq: Once | INTRAVENOUS | Status: DC

## 2015-05-08 MED ORDER — SODIUM CHLORIDE 0.9 % IV SOLN
300.0000 mg/m2 | Freq: Once | INTRAVENOUS | Status: AC
  Administered 2015-05-08: 440 mg via INTRAVENOUS
  Filled 2015-05-08: qty 22

## 2015-05-08 MED ORDER — SODIUM CHLORIDE 0.9 % IV SOLN
Freq: Once | INTRAVENOUS | Status: AC
  Administered 2015-05-08: 15:00:00 via INTRAVENOUS

## 2015-05-08 MED ORDER — DEXTROSE 5 % IV SOLN
36.0000 mg/m2 | Freq: Once | INTRAVENOUS | Status: AC
  Administered 2015-05-08: 54 mg via INTRAVENOUS
  Filled 2015-05-08: qty 27

## 2015-05-08 NOTE — Patient Instructions (Signed)
[facility] Discharge Instructions for Patients Receiving Chemotherapy  Today you received the following chemotherapy agents:  Cytoxan and Kyprolis  To help prevent nausea and vomiting after your treatment, we encourage you to take your nausea medication as ordered per MD.   If you develop nausea and vomiting that is not controlled by your nausea medication, call the clinic.   BELOW ARE SYMPTOMS THAT SHOULD BE REPORTED IMMEDIATELY:  *FEVER GREATER THAN 100.5 F  *CHILLS WITH OR WITHOUT FEVER  NAUSEA AND VOMITING THAT IS NOT CONTROLLED WITH YOUR NAUSEA MEDICATION  *UNUSUAL SHORTNESS OF BREATH  *UNUSUAL BRUISING OR BLEEDING  TENDERNESS IN MOUTH AND THROAT WITH OR WITHOUT PRESENCE OF ULCERS  *URINARY PROBLEMS  *BOWEL PROBLEMS  UNUSUAL RASH Items with * indicate a potential emergency and should be followed up as soon as possible.  Feel free to call the clinic you have any questions or concerns. The clinic phone number is [phone].  Please show the [place] at check-in to the Emergency Department and triage nurse.

## 2015-05-08 NOTE — Progress Notes (Signed)
INR below goal today at 1.3 (goal 2-3) Unsure why INR is dropping Pt reports no missed or extra doses No complications regarding anticoagulation Pt's daughter states she has been eating more broccoli which may cause a decrease in her INR Plan for now:  Increase Coumadin 5 mg daily except for take 2.5 mg on Monday and Friday.   Will check PT/INR next week.  Will see you in infusion on 9/13 Ms. Eblen is aware of coumadin clinic closure. We will continue to see her in infusion until the end of the month.

## 2015-05-08 NOTE — Patient Instructions (Signed)
INR below goal Increase Coumadin 5 mg daily except for take 2.5 mg on Monday and Friday.   Will check PT/INR next week.  Will see you in infusion.

## 2015-05-08 NOTE — Telephone Encounter (Signed)
Per MD, refill for Xanax called in.

## 2015-05-09 ENCOUNTER — Ambulatory Visit (HOSPITAL_BASED_OUTPATIENT_CLINIC_OR_DEPARTMENT_OTHER): Payer: Medicare Other

## 2015-05-09 VITALS — BP 149/66 | HR 65 | Temp 98.7°F | Resp 16

## 2015-05-09 DIAGNOSIS — C9000 Multiple myeloma not having achieved remission: Secondary | ICD-10-CM | POA: Diagnosis not present

## 2015-05-09 DIAGNOSIS — Z5112 Encounter for antineoplastic immunotherapy: Secondary | ICD-10-CM

## 2015-05-09 MED ORDER — DEXTROSE 5 % IV SOLN
36.0000 mg/m2 | Freq: Once | INTRAVENOUS | Status: AC
  Administered 2015-05-09: 54 mg via INTRAVENOUS
  Filled 2015-05-09: qty 27

## 2015-05-09 MED ORDER — SODIUM CHLORIDE 0.9 % IV SOLN
Freq: Once | INTRAVENOUS | Status: AC
  Administered 2015-05-09: 16:00:00 via INTRAVENOUS
  Filled 2015-05-09: qty 4

## 2015-05-09 MED ORDER — SODIUM CHLORIDE 0.9 % IV SOLN
Freq: Once | INTRAVENOUS | Status: AC
  Administered 2015-05-09: 16:00:00 via INTRAVENOUS

## 2015-05-09 NOTE — Patient Instructions (Signed)
[facility] Discharge Instructions for Patients Receiving Chemotherapy  Today you received the following chemotherapy agents:  Kyprolis  To help prevent nausea and vomiting after your treatment, we encourage you to take your nausea medication as ordered per MD.   If you develop nausea and vomiting that is not controlled by your nausea medication, call the clinic.   BELOW ARE SYMPTOMS THAT SHOULD BE REPORTED IMMEDIATELY:  *FEVER GREATER THAN 100.5 F  *CHILLS WITH OR WITHOUT FEVER  NAUSEA AND VOMITING THAT IS NOT CONTROLLED WITH YOUR NAUSEA MEDICATION  *UNUSUAL SHORTNESS OF BREATH  *UNUSUAL BRUISING OR BLEEDING  TENDERNESS IN MOUTH AND THROAT WITH OR WITHOUT PRESENCE OF ULCERS  *URINARY PROBLEMS  *BOWEL PROBLEMS  UNUSUAL RASH Items with * indicate a potential emergency and should be followed up as soon as possible.  Feel free to call the clinic you have any questions or concerns. The clinic phone number is [phone].  Please show the [place] at check-in to the Emergency Department and triage nurse.

## 2015-05-15 ENCOUNTER — Ambulatory Visit (HOSPITAL_BASED_OUTPATIENT_CLINIC_OR_DEPARTMENT_OTHER): Payer: Medicare Other

## 2015-05-15 ENCOUNTER — Ambulatory Visit (HOSPITAL_BASED_OUTPATIENT_CLINIC_OR_DEPARTMENT_OTHER): Payer: Medicare Other | Admitting: Pharmacist

## 2015-05-15 ENCOUNTER — Other Ambulatory Visit (HOSPITAL_BASED_OUTPATIENT_CLINIC_OR_DEPARTMENT_OTHER): Payer: Medicare Other

## 2015-05-15 VITALS — BP 160/65 | HR 67 | Temp 98.5°F | Resp 18

## 2015-05-15 DIAGNOSIS — I82401 Acute embolism and thrombosis of unspecified deep veins of right lower extremity: Secondary | ICD-10-CM | POA: Diagnosis not present

## 2015-05-15 DIAGNOSIS — Z5111 Encounter for antineoplastic chemotherapy: Secondary | ICD-10-CM | POA: Diagnosis not present

## 2015-05-15 DIAGNOSIS — Z5112 Encounter for antineoplastic immunotherapy: Secondary | ICD-10-CM | POA: Diagnosis not present

## 2015-05-15 DIAGNOSIS — C9000 Multiple myeloma not having achieved remission: Secondary | ICD-10-CM

## 2015-05-15 DIAGNOSIS — I82402 Acute embolism and thrombosis of unspecified deep veins of left lower extremity: Secondary | ICD-10-CM

## 2015-05-15 LAB — CBC WITH DIFFERENTIAL/PLATELET
BASO%: 0.1 % (ref 0.0–2.0)
BASOS ABS: 0 10*3/uL (ref 0.0–0.1)
EOS ABS: 0 10*3/uL (ref 0.0–0.5)
EOS%: 0 % (ref 0.0–7.0)
HEMATOCRIT: 32.2 % — AB (ref 34.8–46.6)
HEMOGLOBIN: 10.3 g/dL — AB (ref 11.6–15.9)
LYMPH#: 0.2 10*3/uL — AB (ref 0.9–3.3)
LYMPH%: 3.9 % — ABNORMAL LOW (ref 14.0–49.7)
MCH: 30.6 pg (ref 25.1–34.0)
MCHC: 32.1 g/dL (ref 31.5–36.0)
MCV: 95.5 fL (ref 79.5–101.0)
MONO#: 0 10*3/uL — AB (ref 0.1–0.9)
MONO%: 1 % (ref 0.0–14.0)
NEUT#: 4 10*3/uL (ref 1.5–6.5)
NEUT%: 95 % — ABNORMAL HIGH (ref 38.4–76.8)
Platelets: 204 10*3/uL (ref 145–400)
RBC: 3.37 10*6/uL — ABNORMAL LOW (ref 3.70–5.45)
RDW: 16.1 % — AB (ref 11.2–14.5)
WBC: 4.2 10*3/uL (ref 3.9–10.3)

## 2015-05-15 LAB — COMPREHENSIVE METABOLIC PANEL (CC13)
ALT: 23 U/L (ref 0–55)
AST: 19 U/L (ref 5–34)
Albumin: 3.3 g/dL — ABNORMAL LOW (ref 3.5–5.0)
Alkaline Phosphatase: 58 U/L (ref 40–150)
Anion Gap: 8 mEq/L (ref 3–11)
BUN: 24.8 mg/dL (ref 7.0–26.0)
CALCIUM: 9.2 mg/dL (ref 8.4–10.4)
CHLORIDE: 106 meq/L (ref 98–109)
CO2: 22 meq/L (ref 22–29)
CREATININE: 0.8 mg/dL (ref 0.6–1.1)
EGFR: 78 mL/min/{1.73_m2} — ABNORMAL LOW (ref >90)
Glucose: 214 mg/dl — ABNORMAL HIGH (ref 70–140)
Potassium: 4.4 mEq/L (ref 3.5–5.1)
Sodium: 136 mEq/L (ref 136–145)
Total Bilirubin: 0.29 mg/dL (ref 0.20–1.20)
Total Protein: 6 g/dL — ABNORMAL LOW (ref 6.4–8.3)

## 2015-05-15 LAB — PROTIME-INR
INR: 1.4 — AB (ref 2.00–3.50)
Protime: 16.8 Seconds — ABNORMAL HIGH (ref 10.6–13.4)

## 2015-05-15 LAB — POCT INR: INR: 1.4

## 2015-05-15 MED ORDER — SODIUM CHLORIDE 0.9 % IV SOLN: Freq: Once | INTRAVENOUS | Status: DC

## 2015-05-15 MED ORDER — SODIUM CHLORIDE 0.9 % IV SOLN
Freq: Once | INTRAVENOUS | Status: AC
  Administered 2015-05-15: 15:00:00 via INTRAVENOUS

## 2015-05-15 MED ORDER — DEXTROSE 5 % IV SOLN
36.0000 mg/m2 | Freq: Once | INTRAVENOUS | Status: AC
  Administered 2015-05-15: 54 mg via INTRAVENOUS
  Filled 2015-05-15: qty 27

## 2015-05-15 MED ORDER — DEXAMETHASONE SODIUM PHOSPHATE 100 MG/10ML IJ SOLN
Freq: Once | INTRAMUSCULAR | Status: AC
  Administered 2015-05-15: 16:00:00 via INTRAVENOUS
  Filled 2015-05-15: qty 4

## 2015-05-15 MED ORDER — CYCLOPHOSPHAMIDE CHEMO INJECTION 1 GM
300.0000 mg/m2 | Freq: Once | INTRAMUSCULAR | Status: AC
  Administered 2015-05-15: 440 mg via INTRAVENOUS
  Filled 2015-05-15: qty 22

## 2015-05-15 NOTE — Patient Instructions (Signed)
[facility] Discharge Instructions for Patients Receiving Chemotherapy  Today you received the following chemotherapy agents:  Cytoxan and Kyprolis  To help prevent nausea and vomiting after your treatment, we encourage you to take your nausea medication as ordered per MD.   If you develop nausea and vomiting that is not controlled by your nausea medication, call the clinic.   BELOW ARE SYMPTOMS THAT SHOULD BE REPORTED IMMEDIATELY:  *FEVER GREATER THAN 100.5 F  *CHILLS WITH OR WITHOUT FEVER  NAUSEA AND VOMITING THAT IS NOT CONTROLLED WITH YOUR NAUSEA MEDICATION  *UNUSUAL SHORTNESS OF BREATH  *UNUSUAL BRUISING OR BLEEDING  TENDERNESS IN MOUTH AND THROAT WITH OR WITHOUT PRESENCE OF ULCERS  *URINARY PROBLEMS  *BOWEL PROBLEMS  UNUSUAL RASH Items with * indicate a potential emergency and should be followed up as soon as possible.  Feel free to call the clinic you have any questions or concerns. The clinic phone number is [phone].  Please show the [facility] at check-in to the Emergency Department and triage nurse.

## 2015-05-15 NOTE — Progress Notes (Signed)
INR remains below goal today at 1.4 (goal 2-3) Pt seen in infusion Pt is doing well with no complaints No missed or extra doses No diet or medication changes No unusual bleeding or bruising Still unsure why INR is below goal after dose increases Plan: Increase to Coumadin 5 mg daily.   Will check PT/INR with next appointments on 05/29/15.  Will see you in infusion at 2:30pm

## 2015-05-15 NOTE — Patient Instructions (Signed)
INR remains below goal today Increase Coumadin 5 mg daily.   Will check PT/INR with next appointments on 05/29/15.  Will see you in infusion.

## 2015-05-16 ENCOUNTER — Telehealth: Payer: Self-pay | Admitting: Internal Medicine

## 2015-05-16 ENCOUNTER — Ambulatory Visit (HOSPITAL_BASED_OUTPATIENT_CLINIC_OR_DEPARTMENT_OTHER): Payer: Medicare Other

## 2015-05-16 ENCOUNTER — Other Ambulatory Visit: Payer: Self-pay | Admitting: Medical Oncology

## 2015-05-16 VITALS — BP 144/60 | HR 58 | Temp 98.3°F | Resp 16

## 2015-05-16 DIAGNOSIS — C9000 Multiple myeloma not having achieved remission: Secondary | ICD-10-CM

## 2015-05-16 DIAGNOSIS — Z5112 Encounter for antineoplastic immunotherapy: Secondary | ICD-10-CM | POA: Diagnosis not present

## 2015-05-16 MED ORDER — DEXTROSE 5 % IV SOLN
36.0000 mg/m2 | Freq: Once | INTRAVENOUS | Status: AC
  Administered 2015-05-16: 54 mg via INTRAVENOUS
  Filled 2015-05-16: qty 27

## 2015-05-16 MED ORDER — CARFILZOMIB CHEMO INJECTION 60 MG
36.0000 mg/m2 | Freq: Once | INTRAVENOUS | Status: DC
  Filled 2015-05-16: qty 27

## 2015-05-16 MED ORDER — SODIUM CHLORIDE 0.9 % IV SOLN
Freq: Once | INTRAVENOUS | Status: AC
  Administered 2015-05-16: 16:00:00 via INTRAVENOUS

## 2015-05-16 MED ORDER — SODIUM CHLORIDE 0.9 % IV SOLN
Freq: Once | INTRAVENOUS | Status: AC
  Administered 2015-05-16: 16:00:00 via INTRAVENOUS
  Filled 2015-05-16: qty 4

## 2015-05-16 MED ORDER — OXYCODONE HCL 5 MG PO TABS: ORAL_TABLET | ORAL | Status: DC

## 2015-05-16 NOTE — Progress Notes (Signed)
rx ready for pickup 

## 2015-05-16 NOTE — Progress Notes (Signed)
Patient refused extra hydration

## 2015-05-16 NOTE — Patient Instructions (Signed)
[facility] Discharge Instructions for Patients Receiving Chemotherapy  Today you received the following chemotherapy agents:  Kyprolis  To help prevent nausea and vomiting after your treatment, we encourage you to take your nausea medication as ordered per MD.   If you develop nausea and vomiting that is not controlled by your nausea medication, call the clinic.   BELOW ARE SYMPTOMS THAT SHOULD BE REPORTED IMMEDIATELY:  *FEVER GREATER THAN 100.5 F  *CHILLS WITH OR WITHOUT FEVER  NAUSEA AND VOMITING THAT IS NOT CONTROLLED WITH YOUR NAUSEA MEDICATION  *UNUSUAL SHORTNESS OF BREATH  *UNUSUAL BRUISING OR BLEEDING  TENDERNESS IN MOUTH AND THROAT WITH OR WITHOUT PRESENCE OF ULCERS  *URINARY PROBLEMS  *BOWEL PROBLEMS  UNUSUAL RASH Items with * indicate a potential emergency and should be followed up as soon as possible.  Feel free to call the clinic you have any questions or concerns. The clinic phone number is [phone].  Please show the [place] at check-in to the Emergency Department and triage nurse.

## 2015-05-16 NOTE — Telephone Encounter (Signed)
Patient dtr [name] returned call and confirmed appointment change for 9/27. Will get schedule today.

## 2015-05-16 NOTE — Telephone Encounter (Signed)
Due to PM [facility] 9/27 appointments moved from PM to AM. Left message for dtr Maudie Mercury and asked that patient get new schedule when she comes in today. Also added a comment to today's appointment to send patient for new schedule.

## 2015-05-18 ENCOUNTER — Encounter: Payer: Self-pay | Admitting: Family Medicine

## 2015-05-18 ENCOUNTER — Ambulatory Visit (INDEPENDENT_AMBULATORY_CARE_PROVIDER_SITE_OTHER): Payer: Medicare Other | Admitting: Family Medicine

## 2015-05-18 VITALS — BP 142/70 | HR 71 | Temp 98.3°F | Wt 117.1 lb

## 2015-05-18 DIAGNOSIS — R300 Dysuria: Secondary | ICD-10-CM

## 2015-05-18 DIAGNOSIS — N3001 Acute cystitis with hematuria: Secondary | ICD-10-CM

## 2015-05-18 LAB — POCT URINALYSIS DIPSTICK
Bilirubin, UA: NEGATIVE
GLUCOSE UA: NEGATIVE
Ketones, UA: NEGATIVE
Leukocytes, UA: NEGATIVE
Nitrite, UA: POSITIVE
SPEC GRAV UA: 1.015
UROBILINOGEN UA: NEGATIVE
pH, UA: 6

## 2015-05-18 MED ORDER — CEPHALEXIN 250 MG PO CAPS: 250.0000 mg | ORAL_CAPSULE | Freq: Two times a day (BID) | ORAL | Status: DC

## 2015-05-18 NOTE — Patient Instructions (Signed)
Drink plenty of water and start the antibiotics today.  We'll contact you with your lab report.  Take care.   

## 2015-05-18 NOTE — Progress Notes (Signed)
Pre visit review using our clinic review tool, if applicable. No additional management support is needed unless otherwise documented below in the visit note.  dysuria:yes duration of symptoms:1 week abdominal pain:minimal pain fevers:no back pain: at baseline Vomiting: no No fever known recently.    Meds, vitals, and allergies reviewed.   ROS: See HPI.  Otherwise negative.    GEN: nad, alert and oriented, in wheelchair- baseline for OVs HEENT: mucous membranes moist NECK: supple CV: rrr.  PULM: ctab, no inc wob ABD: soft, +bs, suprapubic area tender EXT: no edema SKIN: no acute rash, jaw line excision site well healed.   BACK: no CVA pain

## 2015-05-20 DIAGNOSIS — N390 Urinary tract infection, site not specified: Secondary | ICD-10-CM | POA: Insufficient documentation

## 2015-05-20 LAB — URINE CULTURE: Colony Count: >=100000

## 2015-05-20 NOTE — Assessment & Plan Note (Signed)
Keflex, ucx, fluids, f/u prn.  Okay not to change coumadin at this point, but routed info to coumadin clinic as a FYI. Nontoxic.  Pt/family agree with plan.

## 2015-05-22 ENCOUNTER — Other Ambulatory Visit: Payer: Medicare Other

## 2015-05-24 ENCOUNTER — Telehealth: Payer: Self-pay | Admitting: Medical Oncology

## 2015-05-24 ENCOUNTER — Telehealth: Payer: Self-pay | Admitting: Internal Medicine

## 2015-05-24 NOTE — Telephone Encounter (Signed)
Per Julien Nordmann pt only needs labs on the week she gets chemo.

## 2015-05-28 ENCOUNTER — Other Ambulatory Visit: Payer: Self-pay | Admitting: *Deleted

## 2015-05-28 DIAGNOSIS — C9000 Multiple myeloma not having achieved remission: Secondary | ICD-10-CM

## 2015-05-28 MED ORDER — OXYCODONE HCL 5 MG PO TABS: ORAL_TABLET | ORAL | Status: DC

## 2015-05-28 NOTE — Telephone Encounter (Signed)
lmovm pt Pain medication ready for pick up

## 2015-05-29 ENCOUNTER — Ambulatory Visit (HOSPITAL_BASED_OUTPATIENT_CLINIC_OR_DEPARTMENT_OTHER): Payer: Medicare Other | Admitting: Lab

## 2015-05-29 ENCOUNTER — Ambulatory Visit (HOSPITAL_BASED_OUTPATIENT_CLINIC_OR_DEPARTMENT_OTHER): Payer: Medicare Other | Admitting: Internal Medicine

## 2015-05-29 ENCOUNTER — Telehealth: Payer: Self-pay | Admitting: Internal Medicine

## 2015-05-29 ENCOUNTER — Other Ambulatory Visit (HOSPITAL_BASED_OUTPATIENT_CLINIC_OR_DEPARTMENT_OTHER): Payer: Medicare Other

## 2015-05-29 ENCOUNTER — Ambulatory Visit (HOSPITAL_BASED_OUTPATIENT_CLINIC_OR_DEPARTMENT_OTHER): Payer: Medicare Other

## 2015-05-29 ENCOUNTER — Ambulatory Visit (HOSPITAL_BASED_OUTPATIENT_CLINIC_OR_DEPARTMENT_OTHER): Payer: Medicare Other | Admitting: Pharmacist

## 2015-05-29 ENCOUNTER — Encounter: Payer: Self-pay | Admitting: Internal Medicine

## 2015-05-29 VITALS — BP 153/70 | HR 74 | Temp 98.7°F | Resp 18 | Ht 60.0 in | Wt 115.7 lb

## 2015-05-29 DIAGNOSIS — Z5112 Encounter for antineoplastic immunotherapy: Secondary | ICD-10-CM

## 2015-05-29 DIAGNOSIS — I82401 Acute embolism and thrombosis of unspecified deep veins of right lower extremity: Secondary | ICD-10-CM

## 2015-05-29 DIAGNOSIS — C9000 Multiple myeloma not having achieved remission: Secondary | ICD-10-CM

## 2015-05-29 DIAGNOSIS — I82402 Acute embolism and thrombosis of unspecified deep veins of left lower extremity: Secondary | ICD-10-CM

## 2015-05-29 LAB — CBC WITH DIFFERENTIAL/PLATELET
BASO%: 0.2 % (ref 0.0–2.0)
Basophils Absolute: 0 10*3/uL (ref 0.0–0.1)
EOS ABS: 0 10*3/uL (ref 0.0–0.5)
EOS%: 0.2 % (ref 0.0–7.0)
HEMATOCRIT: 34.4 % — AB (ref 34.8–46.6)
HGB: 11 g/dL — ABNORMAL LOW (ref 11.6–15.9)
LYMPH#: 0.3 10*3/uL — AB (ref 0.9–3.3)
LYMPH%: 6.2 % — ABNORMAL LOW (ref 14.0–49.7)
MCH: 31.4 pg (ref 25.1–34.0)
MCHC: 32 g/dL (ref 31.5–36.0)
MCV: 98.3 fL (ref 79.5–101.0)
MONO#: 0.1 10*3/uL (ref 0.1–0.9)
MONO%: 1.9 % (ref 0.0–14.0)
NEUT#: 4.8 10*3/uL (ref 1.5–6.5)
NEUT%: 91.5 % — AB (ref 38.4–76.8)
PLATELETS: 270 10*3/uL (ref 145–400)
RBC: 3.5 10*6/uL — ABNORMAL LOW (ref 3.70–5.45)
RDW: 14.8 % — ABNORMAL HIGH (ref 11.2–14.5)
WBC: 5.3 10*3/uL (ref 3.9–10.3)

## 2015-05-29 LAB — COMPREHENSIVE METABOLIC PANEL (CC13)
ALT: 18 U/L (ref 0–55)
ANION GAP: 13 meq/L — AB (ref 3–11)
AST: 15 U/L (ref 5–34)
Albumin: 3.3 g/dL — ABNORMAL LOW (ref 3.5–5.0)
Alkaline Phosphatase: 57 U/L (ref 40–150)
BUN: 13.6 mg/dL (ref 7.0–26.0)
CALCIUM: 9.5 mg/dL (ref 8.4–10.4)
CO2: 26 mEq/L (ref 22–29)
CREATININE: 0.8 mg/dL (ref 0.6–1.1)
Chloride: 103 mEq/L (ref 98–109)
EGFR: 80 mL/min/{1.73_m2} — ABNORMAL LOW (ref >90)
Glucose: 245 mg/dl — ABNORMAL HIGH (ref 70–140)
Potassium: 3.3 mEq/L — ABNORMAL LOW (ref 3.5–5.1)
Sodium: 142 mEq/L (ref 136–145)
TOTAL PROTEIN: 5.8 g/dL — AB (ref 6.4–8.3)

## 2015-05-29 LAB — LACTATE DEHYDROGENASE (CC13): LDH: 340 U/L — AB (ref 125–245)

## 2015-05-29 LAB — PROTIME-INR
INR: 2.4 (ref 2.00–3.50)
Protime: 28.8 Seconds — ABNORMAL HIGH (ref 10.6–13.4)

## 2015-05-29 LAB — POCT INR: INR: 2.4

## 2015-05-29 MED ORDER — DEXTROSE 5 % IV SOLN
36.0000 mg/m2 | Freq: Once | INTRAVENOUS | Status: AC
  Administered 2015-05-29: 54 mg via INTRAVENOUS
  Filled 2015-05-29: qty 27

## 2015-05-29 MED ORDER — SODIUM CHLORIDE 0.9 % IV SOLN
Freq: Once | INTRAVENOUS | Status: AC
  Administered 2015-05-29: 13:00:00 via INTRAVENOUS

## 2015-05-29 MED ORDER — SODIUM CHLORIDE 0.9 % IV SOLN
300.0000 mg/m2 | Freq: Once | INTRAVENOUS | Status: AC
  Administered 2015-05-29: 440 mg via INTRAVENOUS
  Filled 2015-05-29: qty 22

## 2015-05-29 MED ORDER — SODIUM CHLORIDE 0.9 % IV SOLN
Freq: Once | INTRAVENOUS | Status: AC
  Administered 2015-05-29: 13:00:00 via INTRAVENOUS
  Filled 2015-05-29: qty 4

## 2015-05-29 NOTE — Patient Instructions (Signed)
INR at goal No changes Continue Coumadin 5 mg daily.   Will check PT/INR in 2 weeks with infusion appointments. Lab at 2pm. Dr. Julien Nordmann to manage coumadin now.

## 2015-05-29 NOTE — Progress Notes (Signed)
[facility] [phone]   [phone]  OFFICE PROGRESS NOTE  [name], MD [place] [address]  DIAGNOSIS:  1. Multiple myeloma diagnosed in September 2009,  2. history of bilateral lower extremity deep vein thrombosis diagnosed in November 2009  3. acute on chronic deep vein thrombosis in the left lower extremity diagnosed in October 2010.  4.  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  PRIOR THERAPY:  1. status post palliative radiotherapy to the right femur and ischail tuberosity, first was done in December 2011 and the second treatment was completed Jan 08 2011, and the third treatment to the right distal femur completed on 04/30/2011  2. status post palliative radiotherapy to the left proximal femur completed 02/28/2011.  3. Revlimid 25 mg by mouth daily for 21 days every 4 weeks in addition to oral Decadron at 40 mg by mouth on a weekly basis status post 40 cycles.  4. weekly subcutaneous Velcade 1.3 mg/M2 status post 60 cycles, discontinued today secondary to mild disease progression. 5. Pomalyst 4 mg by mouth daily for 21 days every 4 weeks, the patient started the first dose on 12/22/2012 , in addition to Decadron 40 mg weekly. Status post 13 cycles.  6. Chemotherapy according to the [facility] [id number] expanded access treatment protocol with Elotuzumab, lenalidomide and dexamthasone. Status post 5 cycles, discontinued today secondary to disease progression.   CURRENT THERAPY:  1. Systemic chemotherapy with Carfilzomib, Cytoxan and Decadron. First cycle 09/05/2014. Status post 9 cycles. 2. Coumadin followed by the Coumadin clinic at the [facility].  3. Zometa 4 mg IV given every 3 months.  INTERVAL HISTORY: [name] 79 y.o. female returns to the clinic today for follow-up visit accompanied by her daughter. The patient is tolerating her  current treatment with Carfilzomib, Cytoxan and Decadron fairly well with no significant adverse effect fairly well.  She underwentsurgical resection of questionable chest cyst from the left side of her face which was performed on 04/16/2015 and the final pathology was consistent with myxoid neoplasm with locally recurrent potential.  She continues to have low back pain and MRI of the lumbar spine showed degenerative disc disease with no concerning nerve or cord compression. She is currently on pain medication with oxycodone 2 tablets every 6 hours as needed in addition to occasional ibuprofen. She denied having any significant weight loss or night sweats. She has no nausea or vomiting. The patient denied having any significant chest pain, shortness of breath, cough or hemoptysis.  She is here today to start cycle #10 of her systemic chemotherapy.  MEDICAL HISTORY: Past Medical History  Diagnosis Date  . Blood transfusion   . Depression     Mild  . History of chicken pox   . Hyperlipidemia   . Hypertension   . Degenerative joint disease   . Phlebitis   . Multiple myeloma     per Dr. Julien Nordmann, s/p palliative radiation for leg pain 2011 per Dr. Sondra Come  . Deep vein thrombosis of bilateral lower extremities   . Family history of anesthesia complication     DAUGHTER CPR AFTER  . Cardiomyopathy   . Diabetes mellitus     Steroid related  . UTI (urinary tract infection) 09/08/2013  . History of radiation therapy 08/30/13-09/20/13    20 gray to lower lumbar/upper sacrum    ALLERGIES:  has No Known Allergies.  MEDICATIONS:  Current Outpatient Prescriptions  Medication Sig Dispense Refill  . acyclovir (ZOVIRAX) 400 MG tablet TAKE ONE TABLET BY MOUTH TWICE DAILY 60 tablet 0  . ALPRAZolam (XANAX) 0.5 MG tablet TAKE ONE TABLET BY MOUTH TWICE DAILY AS NEEDED FOR ANXIETY 60 tablet 0  . Calcium Carbonate-Vitamin D 600-400 MG-UNIT per tablet Take 1 tablet by mouth 3 (three) times daily with meals.        . cephALEXin (KEFLEX) 250 MG capsule Take 1 capsule (250 mg total) by mouth 2 (two) times daily. 14 capsule 0  . dexamethasone (DECADRON) 4 MG tablet Take 10 tablets (40 mg) weekly on day 1  with Chemotherapy treatment Carfilzomib. 40 tablet 1  . glipiZIDE (GLUCOTROL XL) 2.5 MG 24 hr tablet Take 2.5 mg by mouth daily with breakfast.    . lisinopril-hydrochlorothiazide (PRINZIDE,ZESTORETIC) 20-12.5 MG per tablet Take 1 tablet by mouth daily. 90 tablet 3  . loperamide (IMODIUM A-D) 2 MG tablet Take 1 tablet (2 mg total) by mouth 4 (four) times daily as needed for diarrhea or loose stools.    Marland Kitchen oxyCODONE (OXY IR/ROXICODONE) 5 MG immediate release tablet Take  1-2 tablets ($RemoveBefo'10mg'njuDvJoRVSy$  total) by mouth every 6 hours as needed for severe pain 90 tablet 0  . potassium chloride SA (K-DUR,KLOR-CON) 20 MEQ tablet Take 1 tablet (20 mEq total) by mouth 2 (two) times daily. 14 tablet 0  . potassium chloride SA (K-DUR,KLOR-CON) 20 MEQ tablet TAKE ONE TABLET BY MOUTH TWICE DAILY 20 tablet 0  . prochlorperazine (COMPAZINE) 10 MG tablet Take 1 tablet (10 mg total) by mouth every 6 (six) hours as needed. 30 tablet 0  . temazepam (RESTORIL) 30 MG capsule Take 1 capsule (30 mg total) by mouth at bedtime as needed. 30 capsule 0  . warfarin (COUMADIN) 5 MG tablet TAKE 1 TAB BY MOUTH AS DIRECTED 60 tablet 0   No current facility-administered medications for this visit.   Facility-Administered Medications Ordered in Other Visits  Medication Dose Route Frequency Provider Last Rate Last Dose  . 0.9 %  sodium chloride infusion   Intravenous Once Curt Bears, MD   Stopped at 03/21/15 1720    SURGICAL HISTORY:  Past Surgical History  Procedure Laterality Date  . Abdominal hysterectomy    . Ankle fracture surgery Left   . Ear cyst excision Left 04/16/2015    Procedure: EXCISION FACIAL CYST LEFT SIDE ;  Surgeon: Izora Gala, MD;  Location: [facility];  Service: ENT;  Laterality: Left;    REVIEW OF SYSTEMS:   Constitutional: positive for fatigue Eyes: negative Ears, nose, mouth, throat, and face: negative Respiratory: negative Cardiovascular: negative Gastrointestinal: negative Genitourinary:negative Integument/breast: negative Hematologic/lymphatic: negative Musculoskeletal:positive for bone pain Neurological: negative Behavioral/Psych: negative Endocrine: negative Allergic/Immunologic: negative   PHYSICAL EXAMINATION: General appearance: alert, cooperative and no distress Head: Normocephalic, without obvious abnormality, atraumatic Neck: no adenopathy, no JVD, supple, symmetrical, trachea midline and thyroid not enlarged, symmetric, no tenderness/mass/nodules Lymph nodes: Cervical, supraclavicular, and axillary nodes normal. Resp: clear to auscultation bilaterally Back: symmetric, no curvature. ROM normal. No CVA tenderness. Cardio: regular rate and rhythm, S1, S2 normal, no murmur, click, rub or gallop GI: soft, non-tender; bowel sounds normal; no masses,  no organomegaly Extremities: extremities normal, atraumatic, no cyanosis or edema Neurologic: Alert and oriented X 3, normal strength and tone. Normal symmetric reflexes. Normal coordination and gait  ECOG PERFORMANCE STATUS: 1 - Symptomatic but completely ambulatory  Blood pressure 153/70, pulse 74, temperature 98.7 F (37.1 C), temperature source Oral,  resp. rate 18, height 5' (1.524 m), weight 115 lb 11.2 oz (52.481 kg), SpO2 100 %.  LABORATORY DATA: Lab Results  Component Value Date   WBC 5.3 05/29/2015   HGB 11.0* 05/29/2015   HCT 34.4* 05/29/2015   MCV 98.3 05/29/2015   PLT 270 05/29/2015      Chemistry      Component Value Date/Time   NA 136 05/15/2015 1427   NA 140 04/16/2015 1039   K 4.4 05/15/2015 1427   K 4.1 04/16/2015 1039   CL 95* 04/16/2015 1039   CL 107 02/14/2013 1311   CO2 22 05/15/2015 1427   CO2 27 04/12/2015 1645   BUN 24.8 05/15/2015 1427   BUN 22* 04/16/2015 1039   CREATININE 0.8 05/15/2015  1427   CREATININE 0.80 04/16/2015 1039      Component Value Date/Time   CALCIUM 9.2 05/15/2015 1427   CALCIUM 9.1 04/12/2015 1645   CALCIUM * 06/13/2008 1605    5.7 CRITICAL RESULT CALLED TO, READ BACK BY AND VERIFIED WITH: JAMIE TRACY,RN [id number] @ [address] (NOTE)  Amended report. Result repeated and verified. CORRECTED ON 10/14 AT 1429: PREVIOUSLY REPORTED AS Result repeated and verified.   ALKPHOS 58 05/15/2015 1427   ALKPHOS 49 09/19/2014 1447   AST 19 05/15/2015 1427   AST 18 09/19/2014 1447   ALT 23 05/15/2015 1427   ALT 24 09/19/2014 1447   BILITOT 0.29 05/15/2015 1427   BILITOT 0.4 09/19/2014 1447      RADIOGRAPHIC STUDIES: No results found.  ASSESSMENT AND PLAN: This is a very pleasant 79 years old African-American female with history of multiple myeloma status post several chemotherapy regimens lastly including systemic chemotherapy according to the [facility] [place] 204143 clinical trial with Elotuzumab, Revlimid and dexamethasone. She status post 5 cycles and tolerating her treatment fairly well. This was discontinued secondary to disease progression. Unfortunately the recent myeloma panel showed significant increase in the free lambda light chain. I discussed the lab result with the patient and her daughters. She completed treatment with Elotuzumab, Revlimid and dexamethasone but this was discontinued secondary to disease progression. The patient was started on treatment with systemic chemotherapy with Carfilzomib, Cytoxan and dexamethasone as salvage therapy. She is status post 9 cycles and tolerating this treatment fairly well.   I recommended for the patient to proceed with cycle #10 today as a scheduled. I ordered repeat myeloma panel for evaluation of her disease.  For the recently diagnosed myxoid neoplasm of the face , the patient will continue on observation and close monitoring. For the low back pain, the patient will continue on oxycodone every 6 hours as  needed. She would come back for follow-up visit in one month for reevaluation before starting cycle #11. She was advised to call immediately if she has any concerning symptoms in the interval. The patient voices understanding of current disease status and treatment options and is in agreement with the current care plan.  All questions were answered. The patient knows to call the clinic with any problems, questions or concerns. We can certainly see the patient much sooner if necessary.  Disclaimer: This note was dictated with voice recognition software. Similar sounding words can inadvertently be transcribed and may not be corrected upon review.

## 2015-05-29 NOTE — Progress Notes (Signed)
INR at goal today at 2.4 (goal 2-3)  Pt seen in infusion today with her daughter Pt is doing well with no complaints Discussed plan of transitioning coumadin management back to Dr. Julien Nordmann  This will begin in October with next INR check Pt and daughter voiced understanding No unusual bleeding or bruising No diet or medication changes No missed or extra doses Current dose of 5 mg appears to be appropriate. Will continue Plan: No changes Continue Coumadin 5 mg daily.   Will check PT/INR in 2 weeks with infusion appointments. Lab at 2pm. Dr. Julien Nordmann to manage coumadin now. *Today is last coumadin clinic visit for [name]*

## 2015-05-29 NOTE — Telephone Encounter (Signed)
Gave dtr avs report and appointments for October and November.

## 2015-05-30 ENCOUNTER — Ambulatory Visit (HOSPITAL_BASED_OUTPATIENT_CLINIC_OR_DEPARTMENT_OTHER): Payer: Medicare Other

## 2015-05-30 VITALS — BP 175/63 | HR 65 | Temp 98.3°F | Resp 20

## 2015-05-30 DIAGNOSIS — C9000 Multiple myeloma not having achieved remission: Secondary | ICD-10-CM | POA: Diagnosis not present

## 2015-05-30 DIAGNOSIS — Z5112 Encounter for antineoplastic immunotherapy: Secondary | ICD-10-CM | POA: Diagnosis not present

## 2015-05-30 MED ORDER — DEXTROSE 5 % IV SOLN: 36.0000 mg/m2 | Freq: Once | INTRAVENOUS | Status: DC

## 2015-05-30 MED ORDER — SODIUM CHLORIDE 0.9 % IV SOLN
Freq: Once | INTRAVENOUS | Status: AC
  Administered 2015-05-30: 15:00:00 via INTRAVENOUS

## 2015-05-30 MED ORDER — DEXTROSE 5 % IV SOLN
36.0000 mg/m2 | Freq: Once | INTRAVENOUS | Status: AC
  Administered 2015-05-30: 54 mg via INTRAVENOUS
  Filled 2015-05-30: qty 27

## 2015-05-30 MED ORDER — SODIUM CHLORIDE 0.9 % IV SOLN
Freq: Once | INTRAVENOUS | Status: AC
  Administered 2015-05-30: 16:00:00 via INTRAVENOUS
  Filled 2015-05-30: qty 4

## 2015-05-30 NOTE — Patient Instructions (Signed)
[name] Discharge Instructions for Patients Receiving Chemotherapy  Today you received the following chemotherapy agents:  Kyprolis  To help prevent nausea and vomiting after your treatment, we encourage you to take your nausea medication as ordered per MD.   If you develop nausea and vomiting that is not controlled by your nausea medication, call the clinic.   BELOW ARE SYMPTOMS THAT SHOULD BE REPORTED IMMEDIATELY:  *FEVER GREATER THAN 100.5 F  *CHILLS WITH OR WITHOUT FEVER  NAUSEA AND VOMITING THAT IS NOT CONTROLLED WITH YOUR NAUSEA MEDICATION  *UNUSUAL SHORTNESS OF BREATH  *UNUSUAL BRUISING OR BLEEDING  TENDERNESS IN MOUTH AND THROAT WITH OR WITHOUT PRESENCE OF ULCERS  *URINARY PROBLEMS  *BOWEL PROBLEMS  UNUSUAL RASH Items with * indicate a potential emergency and should be followed up as soon as possible.  Feel free to call the clinic you have any questions or concerns. The clinic phone number is [phone].  Please show the [facility] at check-in to the Emergency Department and triage nurse.

## 2015-05-31 LAB — IGG, IGA, IGM
IGA: 9 mg/dL — AB (ref 69–380)
IGG (IMMUNOGLOBIN G), SERUM: 156 mg/dL — AB (ref 690–1700)
IgM, Serum: 19 mg/dL — ABNORMAL LOW (ref 52–322)

## 2015-05-31 LAB — KAPPA/LAMBDA LIGHT CHAINS
KAPPA FREE LGHT CHN: 0.03 mg/dL — AB (ref 0.33–1.94)
KAPPA LAMBDA RATIO: 0 — AB (ref 0.26–1.65)
Lambda Free Lght Chn: 8.79 mg/dL — ABNORMAL HIGH (ref 0.57–2.63)

## 2015-05-31 LAB — BETA 2 MICROGLOBULIN, SERUM: BETA 2 MICROGLOBULIN: 2.24 mg/L (ref <=2.51)

## 2015-06-05 ENCOUNTER — Other Ambulatory Visit (HOSPITAL_BASED_OUTPATIENT_CLINIC_OR_DEPARTMENT_OTHER): Payer: Medicare Other

## 2015-06-05 ENCOUNTER — Ambulatory Visit (HOSPITAL_BASED_OUTPATIENT_CLINIC_OR_DEPARTMENT_OTHER): Payer: Medicare Other

## 2015-06-05 ENCOUNTER — Other Ambulatory Visit: Payer: Self-pay | Admitting: Medical Oncology

## 2015-06-05 VITALS — BP 154/60 | HR 64 | Temp 98.3°F

## 2015-06-05 DIAGNOSIS — I82401 Acute embolism and thrombosis of unspecified deep veins of right lower extremity: Secondary | ICD-10-CM | POA: Diagnosis not present

## 2015-06-05 DIAGNOSIS — Z5111 Encounter for antineoplastic chemotherapy: Secondary | ICD-10-CM

## 2015-06-05 DIAGNOSIS — Z5112 Encounter for antineoplastic immunotherapy: Secondary | ICD-10-CM | POA: Diagnosis not present

## 2015-06-05 DIAGNOSIS — I82402 Acute embolism and thrombosis of unspecified deep veins of left lower extremity: Secondary | ICD-10-CM

## 2015-06-05 DIAGNOSIS — C9000 Multiple myeloma not having achieved remission: Secondary | ICD-10-CM | POA: Diagnosis not present

## 2015-06-05 LAB — COMPREHENSIVE METABOLIC PANEL (CC13)
ALT: 19 U/L (ref 0–55)
AST: 16 U/L (ref 5–34)
Albumin: 3.1 g/dL — ABNORMAL LOW (ref 3.5–5.0)
Alkaline Phosphatase: 56 U/L (ref 40–150)
Anion Gap: 8 meq/L (ref 3–11)
BUN: 13.1 mg/dL (ref 7.0–26.0)
CO2: 31 meq/L — ABNORMAL HIGH (ref 22–29)
Calcium: 9.8 mg/dL (ref 8.4–10.4)
Chloride: 105 meq/L (ref 98–109)
Creatinine: 0.7 mg/dL (ref 0.6–1.1)
EGFR: >90 ml/min/1.73 m2
Glucose: 63 mg/dL — ABNORMAL LOW (ref 70–140)
Potassium: 3 meq/L — CL (ref 3.5–5.1)
Sodium: 144 meq/L (ref 136–145)
Total Bilirubin: 0.31 mg/dL (ref 0.20–1.20)
Total Protein: 5.4 g/dL — ABNORMAL LOW (ref 6.4–8.3)

## 2015-06-05 LAB — CBC WITH DIFFERENTIAL/PLATELET
BASO%: 0.2 % (ref 0.0–2.0)
BASOS ABS: 0 10*3/uL (ref 0.0–0.1)
EOS ABS: 0.1 10*3/uL (ref 0.0–0.5)
EOS%: 2 % (ref 0.0–7.0)
HCT: 32.7 % — ABNORMAL LOW (ref 34.8–46.6)
HGB: 10.6 g/dL — ABNORMAL LOW (ref 11.6–15.9)
LYMPH%: 10.2 % — AB (ref 14.0–49.7)
MCH: 30.9 pg (ref 25.1–34.0)
MCHC: 32.3 g/dL (ref 31.5–36.0)
MCV: 95.9 fL (ref 79.5–101.0)
MONO#: 0.6 10*3/uL (ref 0.1–0.9)
MONO%: 15.6 % — ABNORMAL HIGH (ref 0.0–14.0)
NEUT#: 2.8 10*3/uL (ref 1.5–6.5)
NEUT%: 72 % (ref 38.4–76.8)
PLATELETS: 136 10*3/uL — AB (ref 145–400)
RBC: 3.42 10*6/uL — AB (ref 3.70–5.45)
RDW: 15.1 % — ABNORMAL HIGH (ref 11.2–14.5)
WBC: 3.9 10*3/uL (ref 3.9–10.3)
lymph#: 0.4 10*3/uL — ABNORMAL LOW (ref 0.9–3.3)

## 2015-06-05 LAB — PROTIME-INR
INR: 3.3 (ref 2.00–3.50)
Protime: 39.6 Seconds — ABNORMAL HIGH (ref 10.6–13.4)

## 2015-06-05 MED ORDER — SODIUM CHLORIDE 0.9 % IV SOLN
300.0000 mg/m2 | Freq: Once | INTRAVENOUS | Status: AC
  Administered 2015-06-05: 440 mg via INTRAVENOUS
  Filled 2015-06-05: qty 22

## 2015-06-05 MED ORDER — SODIUM CHLORIDE 0.9 % IV SOLN
Freq: Once | INTRAVENOUS | Status: AC
  Administered 2015-06-05: 15:00:00 via INTRAVENOUS

## 2015-06-05 MED ORDER — SODIUM CHLORIDE 0.9 % IV SOLN
Freq: Once | INTRAVENOUS | Status: AC
  Administered 2015-06-05: 16:00:00 via INTRAVENOUS
  Filled 2015-06-05: qty 4

## 2015-06-05 MED ORDER — CARFILZOMIB CHEMO INJECTION 60 MG
36.0000 mg/m2 | Freq: Once | INTRAVENOUS | Status: AC
  Administered 2015-06-05: 54 mg via INTRAVENOUS
  Filled 2015-06-05: qty 27

## 2015-06-05 MED ORDER — ZOLEDRONIC ACID 4 MG/100ML IV SOLN
4.0000 mg | Freq: Once | INTRAVENOUS | Status: AC
Start: 2015-06-05 — End: 2015-06-05
  Administered 2015-06-05: 4 mg via INTRAVENOUS
  Filled 2015-06-05: qty 100

## 2015-06-05 MED ORDER — SODIUM CHLORIDE 0.9 % IV SOLN: Freq: Once | INTRAVENOUS | Status: DC

## 2015-06-05 NOTE — Patient Instructions (Signed)
[facility] Discharge Instructions for Patients Receiving Chemotherapy  Today you received the following chemotherapy agents Kyprolis/Cytoxan  To help prevent nausea and vomiting after your treatment, we encourage you to take your nausea medication as prescribed.   If you develop nausea and vomiting that is not controlled by your nausea medication, call the clinic.   BELOW ARE SYMPTOMS THAT SHOULD BE REPORTED IMMEDIATELY:  *FEVER GREATER THAN 100.5 F  *CHILLS WITH OR WITHOUT FEVER  NAUSEA AND VOMITING THAT IS NOT CONTROLLED WITH YOUR NAUSEA MEDICATION  *UNUSUAL SHORTNESS OF BREATH  *UNUSUAL BRUISING OR BLEEDING  TENDERNESS IN MOUTH AND THROAT WITH OR WITHOUT PRESENCE OF ULCERS  *URINARY PROBLEMS  *BOWEL PROBLEMS  UNUSUAL RASH Items with * indicate a potential emergency and should be followed up as soon as possible.  Feel free to call the clinic you have any questions or concerns. The clinic phone number is [phone].  Please show the CHEMO ALERT CARD at check-in to the Emergency Department and triage nurse.   

## 2015-06-05 NOTE — Progress Notes (Signed)
Quick Note:  Call patient with the result and order K Dur 20 meq po qd X 10 days ______ 

## 2015-06-06 ENCOUNTER — Telehealth: Payer: Self-pay | Admitting: Medical Oncology

## 2015-06-06 ENCOUNTER — Ambulatory Visit (HOSPITAL_BASED_OUTPATIENT_CLINIC_OR_DEPARTMENT_OTHER): Payer: Medicare Other

## 2015-06-06 VITALS — BP 197/70 | HR 59 | Temp 98.2°F | Resp 18

## 2015-06-06 DIAGNOSIS — C9000 Multiple myeloma not having achieved remission: Secondary | ICD-10-CM

## 2015-06-06 DIAGNOSIS — Z5112 Encounter for antineoplastic immunotherapy: Secondary | ICD-10-CM | POA: Diagnosis not present

## 2015-06-06 DIAGNOSIS — E876 Hypokalemia: Secondary | ICD-10-CM

## 2015-06-06 MED ORDER — CLONIDINE HCL 0.1 MG PO TABS
ORAL_TABLET | ORAL | Status: AC
  Filled 2015-06-06: qty 2

## 2015-06-06 MED ORDER — SODIUM CHLORIDE 0.9 % IV SOLN
Freq: Once | INTRAVENOUS | Status: AC
  Administered 2015-06-06: 16:00:00 via INTRAVENOUS

## 2015-06-06 MED ORDER — DEXTROSE 5 % IV SOLN
36.0000 mg/m2 | Freq: Once | INTRAVENOUS | Status: AC
  Administered 2015-06-06: 54 mg via INTRAVENOUS
  Filled 2015-06-06: qty 27

## 2015-06-06 MED ORDER — CLONIDINE HCL 0.1 MG PO TABS
0.2000 mg | ORAL_TABLET | Freq: Once | ORAL | Status: AC
  Administered 2015-06-06: 0.2 mg via ORAL

## 2015-06-06 MED ORDER — SODIUM CHLORIDE 0.9 % IV SOLN: Freq: Once | INTRAVENOUS | Status: DC

## 2015-06-06 MED ORDER — SODIUM CHLORIDE 0.9 % IV SOLN
Freq: Once | INTRAVENOUS | Status: AC
  Administered 2015-06-06: 16:00:00 via INTRAVENOUS
  Filled 2015-06-06: qty 4

## 2015-06-06 MED ORDER — CLONIDINE HCL 0.1 MG PO TABS
ORAL_TABLET | ORAL | Status: AC
  Filled 2015-06-06: qty 1

## 2015-06-06 MED ORDER — POTASSIUM CHLORIDE CRYS ER 20 MEQ PO TBCR: 20.0000 meq | EXTENDED_RELEASE_TABLET | Freq: Every day | ORAL | Status: DC

## 2015-06-06 NOTE — Progress Notes (Signed)
Patient's blood pressure 188/70 upon arrival to infusion clinic. Patient states she took her blood pressure medication this morning. Dr. Julien Nordmann notified.  Order given and carried out for Clonidine 0.2 mg PO. Hold additional 250 mL normal saline with ttoday's treatment per Dr. Julien Nordmann.   Dr. Julien Nordmann notified of patient's increased blood pressure. Patient is to check blood pressure at home and contact her family physician per Dr. Julien Nordmann. Patient and daughter verbalized understanding of the above instructions.

## 2015-06-06 NOTE — Patient Instructions (Signed)
[facility] Discharge Instructions for Patients Receiving Chemotherapy  Today you received the following chemotherapy agents Kyprolis.  To help prevent nausea and vomiting after your treatment, we encourage you to take your nausea medication as prescribed.   If you develop nausea and vomiting that is not controlled by your nausea medication, call the clinic.   BELOW ARE SYMPTOMS THAT SHOULD BE REPORTED IMMEDIATELY:  *FEVER GREATER THAN 100.5 F  *CHILLS WITH OR WITHOUT FEVER  NAUSEA AND VOMITING THAT IS NOT CONTROLLED WITH YOUR NAUSEA MEDICATION  *UNUSUAL SHORTNESS OF BREATH  *UNUSUAL BRUISING OR BLEEDING  TENDERNESS IN MOUTH AND THROAT WITH OR WITHOUT PRESENCE OF ULCERS  *URINARY PROBLEMS  *BOWEL PROBLEMS  UNUSUAL RASH Items with * indicate a potential emergency and should be followed up as soon as possible.  Feel free to call the clinic you have any questions or concerns. The clinic phone number is [phone].  Please show the CHEMO ALERT CARD at check-in to the Emergency Department and triage nurse.   

## 2015-06-06 NOTE — Telephone Encounter (Signed)
-----   Message from Curt Bears, MD sent at 06/05/2015  4:34 PM EDT ----- Call patient with the result and order K Dur 20 meq po qd X 10 days

## 2015-06-07 ENCOUNTER — Other Ambulatory Visit: Payer: Self-pay | Admitting: Medical Oncology

## 2015-06-07 ENCOUNTER — Telehealth: Payer: Self-pay | Admitting: Medical Oncology

## 2015-06-07 DIAGNOSIS — C9000 Multiple myeloma not having achieved remission: Secondary | ICD-10-CM

## 2015-06-07 MED ORDER — OXYCODONE HCL 5 MG PO TABS: ORAL_TABLET | ORAL | Status: DC

## 2015-06-07 NOTE — Telephone Encounter (Signed)
rx ready for pick up and message left on home phone.

## 2015-06-08 ENCOUNTER — Telehealth: Payer: Self-pay | Admitting: Family Medicine

## 2015-06-08 NOTE — Telephone Encounter (Signed)
Patient Name: RIHANA KIDDY DOB: 02/06/1936 Initial Comment Caller States mothers BP 151/74 ? this morning. she is on BP meds. Nurse Assessment Nurse: Marcelline Deist, RN, Lynda Date/Time (Eastern Time): 06/08/2015 1:28:24 PM Confirm and document reason for call. If symptomatic, describe symptoms. ---Caller states her mother's BP 151/74 ? this morning. She is on BP meds. She is on chemo. Was seen for that on Wed., it was elevated then. Caller states no symptoms related to high BP. Has the patient traveled out of the country within the last 30 days? ---Not Applicable Does the patient have any new or worsening symptoms? ---Yes Will a triage be completed? ---Yes Related visit to physician within the last 2 weeks? ---No Does the PT have any chronic conditions? (i.e. diabetes, asthma, etc.) ---Yes List chronic conditions. ---on chemo, multiple myeloma, diabetic Guidelines Guideline Title Affirmed Question Affirmed Notes High Blood Pressure [1] BP # 140/90 AND [2] taking BP medications Final Disposition User See PCP within [facility], RN, Kermit Balo Disagree/Comply: Leta Baptist

## 2015-06-08 NOTE — Telephone Encounter (Signed)
Will see at the [facility].  Thanks.

## 2015-06-08 NOTE — Telephone Encounter (Signed)
Pt has appt 06/11/15 at 3 with Dr [name].

## 2015-06-11 ENCOUNTER — Ambulatory Visit (INDEPENDENT_AMBULATORY_CARE_PROVIDER_SITE_OTHER): Payer: Medicare Other | Admitting: Family Medicine

## 2015-06-11 ENCOUNTER — Encounter: Payer: Self-pay | Admitting: Family Medicine

## 2015-06-11 VITALS — BP 136/70 | HR 82 | Temp 99.0°F | Wt 116.5 lb

## 2015-06-11 DIAGNOSIS — E119 Type 2 diabetes mellitus without complications: Secondary | ICD-10-CM | POA: Diagnosis not present

## 2015-06-11 DIAGNOSIS — I10 Essential (primary) hypertension: Secondary | ICD-10-CM | POA: Diagnosis not present

## 2015-06-11 NOTE — Progress Notes (Signed)
Pre visit review using our clinic review tool, if applicable. No additional management support is needed unless otherwise documented below in the visit note.  Recently with variable BP, with variable pain control likely affecting BP.  No CP, SOB, BLE edema.  Didn't tolerate long acting oxycodone prev.  Still on short acting oxycodone.  Taking BP meds at baseline.  Limiting salt.  Family helping her with diet.  No ADE on BP med.  BP usually 130-170s/50-80s.  Sugars recently ~150 when fasting.  Due for f/u A1c, can likely be done with labs at the cancer center.  I'll check on that with Dr. Julien Nordmann.  Meds, vitals, and allergies reviewed.   ROS: See HPI.  Otherwise, noncontributory.  nad ncat Elderly lady in w/c Speech at baseline Mmm OP wnl Neck supple, no LA rrr ctab abd soft, not ttp Ext with trace B edema

## 2015-06-11 NOTE — Patient Instructions (Signed)
Likely that your level of pain and dexamethasone use will cause your BP to go up and down.  If the top number of your BP is 120-150, then you don't need to change the dose.  If your pain is controlled fairly well, and the top number is above 150, then take an extra half dose of lisinopril/HCTZ that day.  I wouldn't change your sugar medicine for now.  If you have sugars consistently above 180 before eating breakfast, then let me know.  I'll ask the cancer clinic to recheck your sugar with your next lab draw.  Take care.  Glad to see you.

## 2015-06-12 ENCOUNTER — Ambulatory Visit (HOSPITAL_BASED_OUTPATIENT_CLINIC_OR_DEPARTMENT_OTHER): Payer: Medicare Other

## 2015-06-12 ENCOUNTER — Other Ambulatory Visit (HOSPITAL_BASED_OUTPATIENT_CLINIC_OR_DEPARTMENT_OTHER): Payer: Medicare Other

## 2015-06-12 ENCOUNTER — Other Ambulatory Visit: Payer: Self-pay | Admitting: Family Medicine

## 2015-06-12 VITALS — BP 147/62 | HR 68 | Temp 98.6°F

## 2015-06-12 DIAGNOSIS — Z5111 Encounter for antineoplastic chemotherapy: Secondary | ICD-10-CM | POA: Diagnosis not present

## 2015-06-12 DIAGNOSIS — E119 Type 2 diabetes mellitus without complications: Secondary | ICD-10-CM

## 2015-06-12 DIAGNOSIS — Z5112 Encounter for antineoplastic immunotherapy: Secondary | ICD-10-CM | POA: Diagnosis not present

## 2015-06-12 DIAGNOSIS — C9000 Multiple myeloma not having achieved remission: Secondary | ICD-10-CM

## 2015-06-12 DIAGNOSIS — C9002 Multiple myeloma in relapse: Secondary | ICD-10-CM | POA: Diagnosis not present

## 2015-06-12 LAB — CBC WITH DIFFERENTIAL/PLATELET
BASO%: 0 % (ref 0.0–2.0)
Basophils Absolute: 0 10*3/uL (ref 0.0–0.1)
EOS ABS: 0 10*3/uL (ref 0.0–0.5)
EOS%: 0 % (ref 0.0–7.0)
HCT: 34.6 % — ABNORMAL LOW (ref 34.8–46.6)
HGB: 11 g/dL — ABNORMAL LOW (ref 11.6–15.9)
LYMPH%: 3.5 % — AB (ref 14.0–49.7)
MCH: 31.3 pg (ref 25.1–34.0)
MCHC: 31.8 g/dL (ref 31.5–36.0)
MCV: 98.6 fL (ref 79.5–101.0)
MONO#: 0.1 10*3/uL (ref 0.1–0.9)
MONO%: 1.1 % (ref 0.0–14.0)
NEUT#: 4.4 10*3/uL (ref 1.5–6.5)
NEUT%: 95.4 % — AB (ref 38.4–76.8)
PLATELETS: 196 10*3/uL (ref 145–400)
RBC: 3.51 10*6/uL — AB (ref 3.70–5.45)
RDW: 15.1 % — ABNORMAL HIGH (ref 11.2–14.5)
WBC: 4.6 10*3/uL (ref 3.9–10.3)
lymph#: 0.2 10*3/uL — ABNORMAL LOW (ref 0.9–3.3)

## 2015-06-12 LAB — HEMOGLOBIN A1C
Hgb A1c MFr Bld: 6.5 % — ABNORMAL HIGH (ref <5.7)
Mean Plasma Glucose: 140 mg/dL — ABNORMAL HIGH (ref <117)

## 2015-06-12 LAB — COMPREHENSIVE METABOLIC PANEL (CC13)
ALBUMIN: 3.6 g/dL (ref 3.5–5.0)
ALK PHOS: 67 U/L (ref 40–150)
ALT: 28 U/L (ref 0–55)
ANION GAP: 9 meq/L (ref 3–11)
AST: 18 U/L (ref 5–34)
BUN: 21 mg/dL (ref 7.0–26.0)
CALCIUM: 9.6 mg/dL (ref 8.4–10.4)
CO2: 23 mEq/L (ref 22–29)
Chloride: 108 mEq/L (ref 98–109)
Creatinine: 0.9 mg/dL (ref 0.6–1.1)
EGFR: 72 mL/min/{1.73_m2} — AB (ref >90)
Glucose: 182 mg/dl — ABNORMAL HIGH (ref 70–140)
POTASSIUM: 5.2 meq/L — AB (ref 3.5–5.1)
Sodium: 140 mEq/L (ref 136–145)
Total Bilirubin: 0.39 mg/dL (ref 0.20–1.20)
Total Protein: 6.2 g/dL — ABNORMAL LOW (ref 6.4–8.3)

## 2015-06-12 MED ORDER — SODIUM CHLORIDE 0.9 % IV SOLN
Freq: Once | INTRAVENOUS | Status: AC
  Administered 2015-06-12: 16:00:00 via INTRAVENOUS
  Filled 2015-06-12: qty 4

## 2015-06-12 MED ORDER — DEXTROSE 5 % IV SOLN
36.0000 mg/m2 | Freq: Once | INTRAVENOUS | Status: AC
  Administered 2015-06-12: 54 mg via INTRAVENOUS
  Filled 2015-06-12: qty 27

## 2015-06-12 MED ORDER — SODIUM CHLORIDE 0.9 % IV SOLN
Freq: Once | INTRAVENOUS | Status: AC
  Administered 2015-06-12: 16:00:00 via INTRAVENOUS

## 2015-06-12 MED ORDER — SODIUM CHLORIDE 0.9 % IV SOLN
300.0000 mg/m2 | Freq: Once | INTRAVENOUS | Status: AC
  Administered 2015-06-12: 440 mg via INTRAVENOUS
  Filled 2015-06-12: qty 22

## 2015-06-12 NOTE — Assessment & Plan Note (Signed)
If the SBP is 120-150, then no change to ACE dose dose.  If pain is controlled fairly well, and  SBP> 150, then take an extra half dose of lisinopril/HCTZ that day.  If BP not controlled, she may need f/u dose of oxycodone.   D/w pt and family in detail.   >25 minutes spent in face to face time with patient, >50% spent in counselling or coordination of care.

## 2015-06-12 NOTE — Patient Instructions (Signed)
[facility] Discharge Instructions for Patients Receiving Chemotherapy  Today you received the following chemotherapy agents kyrpolis/cytoxan  To help prevent nausea and vomiting after your treatment, we encourage you to take your nausea medication as directed   If you develop nausea and vomiting that is not controlled by your nausea medication, call the clinic.   BELOW ARE SYMPTOMS THAT SHOULD BE REPORTED IMMEDIATELY:  *FEVER GREATER THAN 100.5 F  *CHILLS WITH OR WITHOUT FEVER  NAUSEA AND VOMITING THAT IS NOT CONTROLLED WITH YOUR NAUSEA MEDICATION  *UNUSUAL SHORTNESS OF BREATH  *UNUSUAL BRUISING OR BLEEDING  TENDERNESS IN MOUTH AND THROAT WITH OR WITHOUT PRESENCE OF ULCERS  *URINARY PROBLEMS  *BOWEL PROBLEMS  UNUSUAL RASH Items with * indicate a potential emergency and should be followed up as soon as possible.  Feel free to call the clinic you have any questions or concerns. The clinic phone number is [phone].

## 2015-06-12 NOTE — Progress Notes (Signed)
Ok to proceed today without CMET per Dr. Julien Nordmann.

## 2015-06-13 ENCOUNTER — Ambulatory Visit (HOSPITAL_BASED_OUTPATIENT_CLINIC_OR_DEPARTMENT_OTHER): Payer: Medicare Other

## 2015-06-13 VITALS — BP 155/53 | HR 89 | Temp 98.3°F | Resp 20

## 2015-06-13 DIAGNOSIS — C9000 Multiple myeloma not having achieved remission: Secondary | ICD-10-CM

## 2015-06-13 DIAGNOSIS — Z5112 Encounter for antineoplastic immunotherapy: Secondary | ICD-10-CM

## 2015-06-13 MED ORDER — DEXTROSE 5 % IV SOLN
36.0000 mg/m2 | Freq: Once | INTRAVENOUS | Status: AC
  Administered 2015-06-13: 54 mg via INTRAVENOUS
  Filled 2015-06-13: qty 27

## 2015-06-13 MED ORDER — SODIUM CHLORIDE 0.9 % IV SOLN
Freq: Once | INTRAVENOUS | Status: AC
  Administered 2015-06-13: 16:00:00 via INTRAVENOUS

## 2015-06-13 MED ORDER — SODIUM CHLORIDE 0.9 % IV SOLN
Freq: Once | INTRAVENOUS | Status: AC
  Administered 2015-06-13: 16:00:00 via INTRAVENOUS
  Filled 2015-06-13: qty 4

## 2015-06-13 NOTE — Patient Instructions (Signed)
[facility] Discharge Instructions for Patients Receiving Chemotherapy  Today you received the following chemotherapy agents Kyprolis.  To help prevent nausea and vomiting after your treatment, we encourage you to take your nausea medication as prescribed.   If you develop nausea and vomiting that is not controlled by your nausea medication, call the clinic.   BELOW ARE SYMPTOMS THAT SHOULD BE REPORTED IMMEDIATELY:  *FEVER GREATER THAN 100.5 F  *CHILLS WITH OR WITHOUT FEVER  NAUSEA AND VOMITING THAT IS NOT CONTROLLED WITH YOUR NAUSEA MEDICATION  *UNUSUAL SHORTNESS OF BREATH  *UNUSUAL BRUISING OR BLEEDING  TENDERNESS IN MOUTH AND THROAT WITH OR WITHOUT PRESENCE OF ULCERS  *URINARY PROBLEMS  *BOWEL PROBLEMS  UNUSUAL RASH Items with * indicate a potential emergency and should be followed up as soon as possible.  Feel free to call the clinic you have any questions or concerns. The clinic phone number is [phone].  Please show the CHEMO ALERT CARD at check-in to the Emergency Department and triage nurse.   

## 2015-06-17 ENCOUNTER — Telehealth: Payer: Self-pay | Admitting: Family Medicine

## 2015-06-17 NOTE — Telephone Encounter (Signed)
Notify pt, family.  A1c controlled, no change in DM2 med.  Thanks.

## 2015-06-18 ENCOUNTER — Other Ambulatory Visit: Payer: Self-pay | Admitting: Internal Medicine

## 2015-06-18 NOTE — Telephone Encounter (Signed)
Left message on voicemail for daughter [name]) to call back.

## 2015-06-19 ENCOUNTER — Telehealth: Payer: Self-pay | Admitting: Internal Medicine

## 2015-06-19 ENCOUNTER — Telehealth: Payer: Self-pay | Admitting: *Deleted

## 2015-06-19 ENCOUNTER — Other Ambulatory Visit: Payer: Medicare Other

## 2015-06-19 DIAGNOSIS — C9000 Multiple myeloma not having achieved remission: Secondary | ICD-10-CM

## 2015-06-19 MED ORDER — OXYCODONE HCL 5 MG PO TABS: ORAL_TABLET | ORAL | Status: DC

## 2015-06-19 NOTE — Telephone Encounter (Signed)
Per pof todays lab has been cancelled

## 2015-06-19 NOTE — Telephone Encounter (Signed)
Patient's daughter [name] called requesting "refill for oxycodone to pick up tomorrow.  Informed her of today's scheduled lab appointment but says Dr. Julien Nordmann says she doesn't need lab on off treatment weeks.  Called Dr. Julien Nordmann for clarification.  Verbal order received and read back from Dr. Julien Nordmann for no lab needed today and okay to refill oxycodone today.  Order given to [facility] and scheduler via P.O.F. at this time.

## 2015-06-20 NOTE — Telephone Encounter (Signed)
Patient's daughter Maudie Mercury) notified as instructed by telephone and verbalized understanding.

## 2015-06-26 ENCOUNTER — Telehealth: Payer: Self-pay | Admitting: Internal Medicine

## 2015-06-26 ENCOUNTER — Other Ambulatory Visit (HOSPITAL_BASED_OUTPATIENT_CLINIC_OR_DEPARTMENT_OTHER): Payer: Medicare Other

## 2015-06-26 ENCOUNTER — Ambulatory Visit (HOSPITAL_BASED_OUTPATIENT_CLINIC_OR_DEPARTMENT_OTHER): Payer: Medicare Other | Admitting: Internal Medicine

## 2015-06-26 ENCOUNTER — Encounter: Payer: Self-pay | Admitting: Internal Medicine

## 2015-06-26 ENCOUNTER — Ambulatory Visit (HOSPITAL_BASED_OUTPATIENT_CLINIC_OR_DEPARTMENT_OTHER): Payer: Medicare Other

## 2015-06-26 VITALS — BP 122/54 | HR 76 | Temp 98.3°F | Resp 18 | Ht 60.0 in | Wt 116.0 lb

## 2015-06-26 DIAGNOSIS — C9000 Multiple myeloma not having achieved remission: Secondary | ICD-10-CM

## 2015-06-26 DIAGNOSIS — M150 Primary generalized (osteo)arthritis: Secondary | ICD-10-CM | POA: Diagnosis not present

## 2015-06-26 DIAGNOSIS — I82402 Acute embolism and thrombosis of unspecified deep veins of left lower extremity: Secondary | ICD-10-CM

## 2015-06-26 DIAGNOSIS — Z5112 Encounter for antineoplastic immunotherapy: Secondary | ICD-10-CM

## 2015-06-26 DIAGNOSIS — Z86718 Personal history of other venous thrombosis and embolism: Secondary | ICD-10-CM

## 2015-06-26 DIAGNOSIS — M159 Polyosteoarthritis, unspecified: Secondary | ICD-10-CM

## 2015-06-26 DIAGNOSIS — Z5111 Encounter for antineoplastic chemotherapy: Secondary | ICD-10-CM | POA: Insufficient documentation

## 2015-06-26 DIAGNOSIS — Z7901 Long term (current) use of anticoagulants: Secondary | ICD-10-CM

## 2015-06-26 DIAGNOSIS — G8929 Other chronic pain: Secondary | ICD-10-CM

## 2015-06-26 LAB — COMPREHENSIVE METABOLIC PANEL (CC13)
ALBUMIN: 3.3 g/dL — AB (ref 3.5–5.0)
ALK PHOS: 59 U/L (ref 40–150)
ALT: 19 U/L (ref 0–55)
ANION GAP: 11 meq/L (ref 3–11)
AST: 16 U/L (ref 5–34)
BUN: 21.7 mg/dL (ref 7.0–26.0)
CALCIUM: 9.1 mg/dL (ref 8.4–10.4)
CO2: 24 mEq/L (ref 22–29)
CREATININE: 1.1 mg/dL (ref 0.6–1.1)
Chloride: 107 mEq/L (ref 98–109)
EGFR: 56 mL/min/{1.73_m2} — ABNORMAL LOW (ref >90)
Glucose: 295 mg/dl — ABNORMAL HIGH (ref 70–140)
Potassium: 3.8 mEq/L (ref 3.5–5.1)
Sodium: 142 mEq/L (ref 136–145)
TOTAL PROTEIN: 5.8 g/dL — AB (ref 6.4–8.3)

## 2015-06-26 LAB — CBC WITH DIFFERENTIAL/PLATELET
BASO%: 0.2 % (ref 0.0–2.0)
BASOS ABS: 0 10*3/uL (ref 0.0–0.1)
EOS ABS: 0 10*3/uL (ref 0.0–0.5)
EOS%: 0.1 % (ref 0.0–7.0)
HCT: 33.7 % — ABNORMAL LOW (ref 34.8–46.6)
HGB: 10.8 g/dL — ABNORMAL LOW (ref 11.6–15.9)
LYMPH%: 5.1 % — AB (ref 14.0–49.7)
MCH: 31.4 pg (ref 25.1–34.0)
MCHC: 32.2 g/dL (ref 31.5–36.0)
MCV: 97.6 fL (ref 79.5–101.0)
MONO#: 0.1 10*3/uL (ref 0.1–0.9)
MONO%: 1.4 % (ref 0.0–14.0)
NEUT#: 4.8 10*3/uL (ref 1.5–6.5)
NEUT%: 93.2 % — AB (ref 38.4–76.8)
Platelets: 278 10*3/uL (ref 145–400)
RBC: 3.45 10*6/uL — AB (ref 3.70–5.45)
RDW: 15.3 % — ABNORMAL HIGH (ref 11.2–14.5)
WBC: 5.2 10*3/uL (ref 3.9–10.3)
lymph#: 0.3 10*3/uL — ABNORMAL LOW (ref 0.9–3.3)

## 2015-06-26 LAB — PROTIME-INR
INR: 3.7 — ABNORMAL HIGH (ref 2.00–3.50)
PROTIME: 44.4 s — AB (ref 10.6–13.4)

## 2015-06-26 MED ORDER — DEXTROSE 5 % IV SOLN
36.0000 mg/m2 | Freq: Once | INTRAVENOUS | Status: AC
  Administered 2015-06-26: 54 mg via INTRAVENOUS
  Filled 2015-06-26: qty 27

## 2015-06-26 MED ORDER — SODIUM CHLORIDE 0.9 % IV SOLN
300.0000 mg/m2 | Freq: Once | INTRAVENOUS | Status: AC
  Administered 2015-06-26: 440 mg via INTRAVENOUS
  Filled 2015-06-26: qty 22

## 2015-06-26 MED ORDER — SODIUM CHLORIDE 0.9 % IV SOLN
Freq: Once | INTRAVENOUS | Status: AC
  Administered 2015-06-26: 14:00:00 via INTRAVENOUS
  Filled 2015-06-26: qty 4

## 2015-06-26 MED ORDER — SODIUM CHLORIDE 0.9 % IV SOLN
Freq: Once | INTRAVENOUS | Status: AC
  Administered 2015-06-26: 13:00:00 via INTRAVENOUS

## 2015-06-26 MED ORDER — SODIUM CHLORIDE 0.9 % IV SOLN: Freq: Once | INTRAVENOUS | Status: DC

## 2015-06-26 NOTE — Patient Instructions (Signed)
[facility] Discharge Instructions for Patients Receiving Chemotherapy  Today you received the following chemotherapy agents kyrpolis/cytoxan  To help prevent nausea and vomiting after your treatment, we encourage you to take your nausea medication as directed   If you develop nausea and vomiting that is not controlled by your nausea medication, call the clinic.   BELOW ARE SYMPTOMS THAT SHOULD BE REPORTED IMMEDIATELY:  *FEVER GREATER THAN 100.5 F  *CHILLS WITH OR WITHOUT FEVER  NAUSEA AND VOMITING THAT IS NOT CONTROLLED WITH YOUR NAUSEA MEDICATION  *UNUSUAL SHORTNESS OF BREATH  *UNUSUAL BRUISING OR BLEEDING  TENDERNESS IN MOUTH AND THROAT WITH OR WITHOUT PRESENCE OF ULCERS  *URINARY PROBLEMS  *BOWEL PROBLEMS  UNUSUAL RASH Items with * indicate a potential emergency and should be followed up as soon as possible.  Feel free to call the clinic you have any questions or concerns. The clinic phone number is [phone].

## 2015-06-26 NOTE — Telephone Encounter (Signed)
Gave adn printed appt sched adn avs for pt for OCT thru [facility]

## 2015-06-26 NOTE — Progress Notes (Signed)
[facility] Telephone:([phone]) [phone]   [phone]) [phone]  OFFICE PROGRESS NOTE  [name], MD [place] [address]  DIAGNOSIS:  1. Multiple myeloma diagnosed in September 2009,  2. history of bilateral lower extremity deep vein thrombosis diagnosed in November 2009  3. acute on chronic deep vein thrombosis in the left lower extremity diagnosed in October 2010.  4.  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  PRIOR THERAPY:  1. status post palliative radiotherapy to the right femur and ischail tuberosity, first was done in December 2011 and the second treatment was completed Jan 08 2011, and the third treatment to the right distal femur completed on 04/30/2011  2. status post palliative radiotherapy to the left proximal femur completed 02/28/2011.  3. Revlimid 25 mg by mouth daily for 21 days every 4 weeks in addition to oral Decadron at 40 mg by mouth on a weekly basis status post 40 cycles.  4. weekly subcutaneous Velcade 1.3 mg/M2 status post 60 cycles, discontinued today secondary to mild disease progression. 5. Pomalyst 4 mg by mouth daily for 21 days every 4 weeks, the patient started the first dose on 12/22/2012 , in addition to Decadron 40 mg weekly. Status post 13 cycles.  6. Chemotherapy according to the [facility] [id number] expanded access treatment protocol with Elotuzumab, lenalidomide and dexamthasone. Status post 5 cycles, discontinued today secondary to disease progression.   CURRENT THERAPY:  1. Systemic chemotherapy with Carfilzomib, Cytoxan and Decadron. First cycle 09/05/2014. Status post 9 cycles. 2. Coumadin 5 mg by mouth daily. This will be changed today to 5 mg by mouth daily except Monday and Thursday 2.5 mg. 3. Zometa 4 mg IV given every 3 months.  INTERVAL HISTORY: [name] 79 y.o. female returns to the clinic today for follow-up visit accompanied  by her daughter. The patient is tolerating her current treatment with Carfilzomib, Cytoxan and Decadron fairly well with no significant adverse effect fairly well.  She continues to complain of generalized fatigue as well as back pain with radiation to the lower extremities. Imaging studies that were performed recently showed no evidence for cord compression or progressive myeloma lesions. She is currently on pain medication with oxycodone 2 tablets every 6 hours as needed in addition to occasional ibuprofen. She denied having any significant weight loss or night sweats. She has no nausea or vomiting. The patient denied having any significant chest pain, shortness of breath, cough or hemoptysis.  She is here today to start cycle #11 of her systemic chemotherapy.  MEDICAL HISTORY: Past Medical History  Diagnosis Date  . Blood transfusion   . Depression     Mild  . History of chicken pox   . Hyperlipidemia   . Hypertension   . Degenerative joint disease   . Phlebitis   . Multiple myeloma     per Dr. Julien Nordmann, s/p palliative radiation for leg pain 2011 per Dr. Sondra Come  . Deep vein thrombosis of bilateral lower extremities (HCC)   . Family history of anesthesia complication     DAUGHTER CPR AFTER  . Cardiomyopathy ([facility])   . Diabetes mellitus     Steroid related  . UTI (urinary tract infection) 09/08/2013  . History of radiation therapy 08/30/13-09/20/13    20 gray to lower lumbar/upper sacrum    ALLERGIES:  has No Known Allergies.  MEDICATIONS:  Current Outpatient Prescriptions  Medication Sig Dispense Refill  .  acyclovir (ZOVIRAX) 400 MG tablet TAKE ONE TABLET BY MOUTH TWICE DAILY 60 tablet 0  . ALPRAZolam (XANAX) 0.5 MG tablet TAKE ONE TABLET BY MOUTH TWICE DAILY AS NEEDED FOR ANXIETY 60 tablet 0  . Calcium Carbonate-Vitamin D 600-400 MG-UNIT per tablet Take 1 tablet by mouth.     . dexamethasone (DECADRON) 4 MG tablet Take 10 tablets (40 mg) weekly on day 1  with Chemotherapy treatment  Carfilzomib. 40 tablet 1  . dexamethasone (DECADRON) 4 MG tablet TAKE 10 TABLETS BY MOUTH WEEKLY ON DAY 1 WITH CHEMOTHERAPY TREATMENT CARFILZOMIB 40 tablet 0  . glipiZIDE (GLUCOTROL XL) 2.5 MG 24 hr tablet Take 2.5 mg by mouth daily with breakfast.    . lisinopril-hydrochlorothiazide (PRINZIDE,ZESTORETIC) 20-12.5 MG per tablet Take 1 tablet by mouth daily. 90 tablet 3  . loperamide (IMODIUM A-D) 2 MG tablet Take 1 tablet (2 mg total) by mouth 4 (four) times daily as needed for diarrhea or loose stools.    Marland Kitchen oxyCODONE (OXY IR/ROXICODONE) 5 MG immediate release tablet Take  1-2 tablets (68m total) by mouth every 6 hours as needed for severe pain 90 tablet 0  . potassium chloride SA (K-DUR,KLOR-CON) 20 MEQ tablet TAKE ONE TABLET BY MOUTH TWICE DAILY 20 tablet 0  . prochlorperazine (COMPAZINE) 10 MG tablet Take 1 tablet (10 mg total) by mouth every 6 (six) hours as needed. 30 tablet 0  . temazepam (RESTORIL) 30 MG capsule Take 1 capsule (30 mg total) by mouth at bedtime as needed. 30 capsule 0  . warfarin (COUMADIN) 5 MG tablet TAKE ONE TABLET BY MOUTH ONCE DAILY AS DIRECTED 60 tablet 0   No current facility-administered medications for this visit.   Facility-Administered Medications Ordered in Other Visits  Medication Dose Route Frequency Provider Last Rate Last Dose  . 0.9 %  sodium chloride infusion   Intravenous Once MCurt Bears MD   Stopped at 03/21/15 1720  . 0.9 %  sodium chloride infusion   Intravenous Once MCurt Bears MD        SURGICAL HISTORY:  Past Surgical History  Procedure Laterality Date  . Abdominal hysterectomy    . Ankle fracture surgery Left   . Ear cyst excision Left 04/16/2015    Procedure: EXCISION FACIAL CYST LEFT SIDE ;  Surgeon: JIzora Gala MD;  Location[facility]  Service: ENT;  Laterality: Left;    REVIEW OF SYSTEMS:  Constitutional: positive for fatigue Eyes: negative Ears, nose, mouth, throat, and face: negative Respiratory:  negative Cardiovascular: negative Gastrointestinal: negative Genitourinary:negative Integument/breast: negative Hematologic/lymphatic: negative Musculoskeletal:positive for bone pain Neurological: negative Behavioral/Psych: negative Endocrine: negative Allergic/Immunologic: negative   PHYSICAL EXAMINATION: General appearance: alert, cooperative and no distress Head: Normocephalic, without obvious abnormality, atraumatic Neck: no adenopathy, no JVD, supple, symmetrical, trachea midline and thyroid not enlarged, symmetric, no tenderness/mass/nodules Lymph nodes: Cervical, supraclavicular, and axillary nodes normal. Resp: clear to auscultation bilaterally Back: symmetric, no curvature. ROM normal. No CVA tenderness. Cardio: regular rate and rhythm, S1, S2 normal, no murmur, click, rub or gallop GI: soft, non-tender; bowel sounds normal; no masses,  no organomegaly Extremities: extremities normal, atraumatic, no cyanosis or edema Neurologic: Alert and oriented X 3, normal strength and tone. Normal symmetric reflexes. Normal coordination and gait  ECOG PERFORMANCE STATUS: 1 - Symptomatic but completely ambulatory  Blood pressure 122/54, pulse 76, temperature 98.3 F (36.8 C), temperature source Oral, resp. rate 18, height 5' (1.524 m), weight 116 lb (52.617 kg), SpO2 100 %.  LABORATORY DATA: Lab Results  Component Value Date   WBC 5.2 06/26/2015   HGB 10.8* 06/26/2015   HCT 33.7* 06/26/2015   MCV 97.6 06/26/2015   PLT 278 06/26/2015      Chemistry      Component Value Date/Time   NA 140 06/12/2015 1448   NA 140 04/16/2015 1039   K 5.2* 06/12/2015 1448   K 4.1 04/16/2015 1039   CL 95* 04/16/2015 1039   CL 107 02/14/2013 1311   CO2 23 06/12/2015 1448   CO2 27 04/12/2015 1645   BUN 21.0 06/12/2015 1448   BUN 22* 04/16/2015 1039   CREATININE 0.9 06/12/2015 1448   CREATININE 0.80 04/16/2015 1039      Component Value Date/Time   CALCIUM 9.6 06/12/2015 1448   CALCIUM 9.1  04/12/2015 1645   CALCIUM * 06/13/2008 1605    5.7 CRITICAL RESULT CALLED TO, READ BACK BY AND VERIFIED WITH: JAMIE TRACY,RN [id number] @ [address] (NOTE)  Amended report. Result repeated and verified. CORRECTED ON 10/14 AT 1429: PREVIOUSLY REPORTED AS Result repeated and verified.   ALKPHOS 67 06/12/2015 1448   ALKPHOS 49 09/19/2014 1447   AST 18 06/12/2015 1448   AST 18 09/19/2014 1447   ALT 28 06/12/2015 1448   ALT 24 09/19/2014 1447   BILITOT 0.39 06/12/2015 1448   BILITOT 0.4 09/19/2014 1447      RADIOGRAPHIC STUDIES: No results found.  ASSESSMENT AND PLAN: This is a very pleasant 79 years old African-American female with history of multiple myeloma status post several chemotherapy regimens lastly including systemic chemotherapy according to the [facility] CA 204143 clinical trial with Elotuzumab, Revlimid and dexamethasone. She status post 5 cycles and tolerating her treatment fairly well. This was discontinued secondary to disease progression. Unfortunately the recent myeloma panel showed significant increase in the free lambda light chain. I discussed the lab result with the patient and her daughters. She completed treatment with Elotuzumab, Revlimid and dexamethasone but this was discontinued secondary to disease progression. The patient was started on treatment with systemic chemotherapy with Carfilzomib, Cytoxan and dexamethasone as salvage therapy. She is status post 9 cycles and tolerating this treatment fairly well.  I recommended for the patient to proceed with cycle #11 today as a scheduled.  For the low back pain, the patient will continue on oxycodone every 6 hours as needed. She would come back for follow-up visit in one month for reevaluation before starting cycle #12. For the history of deep venous thrombosis, her PT/INR is elevated today, the patient will continue on Coumadin 5 mg by mouth daily except for Monday and Thursday the patient will be on 2.5 mg. She was  advised to call immediately if she has any concerning symptoms in the interval. The patient voices understanding of current disease status and treatment options and is in agreement with the current care plan.  All questions were answered. The patient knows to call the clinic with any problems, questions or concerns. We can certainly see the patient much sooner if necessary.  Disclaimer: This note was dictated with voice recognition software. Similar sounding words can inadvertently be transcribed and may not be corrected upon review.

## 2015-06-27 ENCOUNTER — Ambulatory Visit (HOSPITAL_BASED_OUTPATIENT_CLINIC_OR_DEPARTMENT_OTHER): Payer: Medicare Other

## 2015-06-27 VITALS — BP 159/89 | HR 65 | Temp 98.0°F | Resp 20

## 2015-06-27 DIAGNOSIS — Z5112 Encounter for antineoplastic immunotherapy: Secondary | ICD-10-CM | POA: Diagnosis not present

## 2015-06-27 DIAGNOSIS — C9000 Multiple myeloma not having achieved remission: Secondary | ICD-10-CM | POA: Diagnosis not present

## 2015-06-27 MED ORDER — SODIUM CHLORIDE 0.9 % IV SOLN
Freq: Once | INTRAVENOUS | Status: DC
Start: 2015-06-27 — End: 2015-06-27

## 2015-06-27 MED ORDER — SODIUM CHLORIDE 0.9 % IV SOLN
Freq: Once | INTRAVENOUS | Status: AC
  Administered 2015-06-27: 16:00:00 via INTRAVENOUS
  Filled 2015-06-27: qty 4

## 2015-06-27 MED ORDER — SODIUM CHLORIDE 0.9 % IV SOLN
Freq: Once | INTRAVENOUS | Status: AC
  Administered 2015-06-27: 16:00:00 via INTRAVENOUS

## 2015-06-27 MED ORDER — DEXTROSE 5 % IV SOLN
36.0000 mg/m2 | Freq: Once | INTRAVENOUS | Status: AC
  Administered 2015-06-27: 54 mg via INTRAVENOUS
  Filled 2015-06-27: qty 27

## 2015-07-02 ENCOUNTER — Other Ambulatory Visit: Payer: Self-pay | Admitting: Medical Oncology

## 2015-07-02 DIAGNOSIS — C9000 Multiple myeloma not having achieved remission: Secondary | ICD-10-CM

## 2015-07-02 MED ORDER — OXYCODONE HCL 5 MG PO TABS: ORAL_TABLET | ORAL | Status: DC

## 2015-07-02 NOTE — Progress Notes (Signed)
RX given to pt family member.

## 2015-07-03 ENCOUNTER — Ambulatory Visit (HOSPITAL_BASED_OUTPATIENT_CLINIC_OR_DEPARTMENT_OTHER): Payer: Medicare Other

## 2015-07-03 ENCOUNTER — Ambulatory Visit (HOSPITAL_BASED_OUTPATIENT_CLINIC_OR_DEPARTMENT_OTHER): Payer: Medicare Other | Admitting: Medical Oncology

## 2015-07-03 VITALS — BP 168/81 | HR 77 | Temp 98.0°F | Resp 18

## 2015-07-03 DIAGNOSIS — Z7901 Long term (current) use of anticoagulants: Secondary | ICD-10-CM

## 2015-07-03 DIAGNOSIS — E876 Hypokalemia: Secondary | ICD-10-CM

## 2015-07-03 DIAGNOSIS — C9000 Multiple myeloma not having achieved remission: Secondary | ICD-10-CM

## 2015-07-03 DIAGNOSIS — Z5112 Encounter for antineoplastic immunotherapy: Secondary | ICD-10-CM

## 2015-07-03 DIAGNOSIS — I82402 Acute embolism and thrombosis of unspecified deep veins of left lower extremity: Secondary | ICD-10-CM

## 2015-07-03 LAB — COMPREHENSIVE METABOLIC PANEL (CC13)
ALT: 18 U/L (ref 0–55)
ANION GAP: 14 meq/L — AB (ref 3–11)
AST: 16 U/L (ref 5–34)
Albumin: 3.2 g/dL — ABNORMAL LOW (ref 3.5–5.0)
Alkaline Phosphatase: 60 U/L (ref 40–150)
BUN: 22.9 mg/dL (ref 7.0–26.0)
CALCIUM: 9.9 mg/dL (ref 8.4–10.4)
CHLORIDE: 103 meq/L (ref 98–109)
CO2: 24 meq/L (ref 22–29)
Creatinine: 1 mg/dL (ref 0.6–1.1)
EGFR: 66 mL/min/{1.73_m2} — AB (ref >90)
Glucose: 366 mg/dl — ABNORMAL HIGH (ref 70–140)
Potassium: 3.2 mEq/L — ABNORMAL LOW (ref 3.5–5.1)
Sodium: 141 mEq/L (ref 136–145)
Total Protein: 5.8 g/dL — ABNORMAL LOW (ref 6.4–8.3)

## 2015-07-03 LAB — CBC WITH DIFFERENTIAL/PLATELET
BASO%: 0 % (ref 0.0–2.0)
Basophils Absolute: 0 10*3/uL (ref 0.0–0.1)
EOS%: 0 % (ref 0.0–7.0)
Eosinophils Absolute: 0 10*3/uL (ref 0.0–0.5)
HCT: 33.1 % — ABNORMAL LOW (ref 34.8–46.6)
HGB: 10.5 g/dL — ABNORMAL LOW (ref 11.6–15.9)
LYMPH%: 3.5 % — AB (ref 14.0–49.7)
MCH: 30.8 pg (ref 25.1–34.0)
MCHC: 31.8 g/dL (ref 31.5–36.0)
MCV: 97.1 fL (ref 79.5–101.0)
MONO#: 0 10*3/uL — ABNORMAL LOW (ref 0.1–0.9)
MONO%: 0.7 % (ref 0.0–14.0)
NEUT#: 5 10*3/uL (ref 1.5–6.5)
NEUT%: 95.8 % — AB (ref 38.4–76.8)
Platelets: 121 10*3/uL — ABNORMAL LOW (ref 145–400)
RBC: 3.41 10*6/uL — AB (ref 3.70–5.45)
RDW: 15.5 % — ABNORMAL HIGH (ref 11.2–14.5)
WBC: 5.3 10*3/uL (ref 3.9–10.3)
lymph#: 0.2 10*3/uL — ABNORMAL LOW (ref 0.9–3.3)

## 2015-07-03 LAB — PROTIME-INR
INR: 4.2 — AB (ref 2.00–3.50)
Protime: 50.4 Seconds — ABNORMAL HIGH (ref 10.6–13.4)

## 2015-07-03 MED ORDER — POTASSIUM CHLORIDE CRYS ER 20 MEQ PO TBCR: 20.0000 meq | EXTENDED_RELEASE_TABLET | Freq: Once | ORAL | Status: DC

## 2015-07-03 MED ORDER — DEXTROSE 5 % IV SOLN
36.0000 mg/m2 | Freq: Once | INTRAVENOUS | Status: AC
  Administered 2015-07-03: 54 mg via INTRAVENOUS
  Filled 2015-07-03: qty 27

## 2015-07-03 MED ORDER — SODIUM CHLORIDE 0.9 % IV SOLN
300.0000 mg/m2 | Freq: Once | INTRAVENOUS | Status: AC
  Administered 2015-07-03: 440 mg via INTRAVENOUS
  Filled 2015-07-03: qty 22

## 2015-07-03 MED ORDER — SODIUM CHLORIDE 0.9 % IV SOLN
Freq: Once | INTRAVENOUS | Status: AC
  Administered 2015-07-03: 16:00:00 via INTRAVENOUS
  Filled 2015-07-03: qty 4

## 2015-07-03 MED ORDER — SODIUM CHLORIDE 0.9 % IV SOLN
Freq: Once | INTRAVENOUS | Status: AC
  Administered 2015-07-03: 15:00:00 via INTRAVENOUS

## 2015-07-03 MED ORDER — SODIUM CHLORIDE 0.9 % IV SOLN: Freq: Once | INTRAVENOUS | Status: DC

## 2015-07-03 NOTE — Progress Notes (Signed)
Quick Note:  Call patient with the result and order K Dur 20 meq po qd X 7 days ______ 

## 2015-07-03 NOTE — Progress Notes (Signed)
Quick Note:  Call patient with the result and reduce her coumadin dose to 2.5 mg po qd except Monday and Thursday 5 mg. ______

## 2015-07-03 NOTE — Progress Notes (Signed)
Notified Dr. Julien Nordmann regarding pt potassium level 3.2. Desk nurse will place order for potassium supplement for pharmacy today. Pt PT/INR addressed by Dr. Julien Nordmann as well per note on lab results.

## 2015-07-03 NOTE — Progress Notes (Signed)
Daughter [name] give written an verbal instructions for coumadin dosing changes.

## 2015-07-03 NOTE — Patient Instructions (Signed)
[facility] Discharge Instructions for Patients Receiving Chemotherapy  Today you received the following chemotherapy agents:  Cytoxan and Kyprolis  To help prevent nausea and vomiting after your treatment, we encourage you to take your nausea medication as ordered per MD.   If you develop nausea and vomiting that is not controlled by your nausea medication, call the clinic.   BELOW ARE SYMPTOMS THAT SHOULD BE REPORTED IMMEDIATELY:  *FEVER GREATER THAN 100.5 F  *CHILLS WITH OR WITHOUT FEVER  NAUSEA AND VOMITING THAT IS NOT CONTROLLED WITH YOUR NAUSEA MEDICATION  *UNUSUAL SHORTNESS OF BREATH  *UNUSUAL BRUISING OR BLEEDING  TENDERNESS IN MOUTH AND THROAT WITH OR WITHOUT PRESENCE OF ULCERS  *URINARY PROBLEMS  *BOWEL PROBLEMS  UNUSUAL RASH Items with * indicate a potential emergency and should be followed up as soon as possible.  Feel free to call the clinic you have any questions or concerns. The clinic phone number is [phone].  Please show the [facility] at check-in to the Emergency Department and triage nurse.

## 2015-07-04 ENCOUNTER — Ambulatory Visit (HOSPITAL_BASED_OUTPATIENT_CLINIC_OR_DEPARTMENT_OTHER): Payer: Medicare Other

## 2015-07-04 VITALS — BP 156/56 | HR 71 | Temp 98.3°F | Resp 20

## 2015-07-04 DIAGNOSIS — C9000 Multiple myeloma not having achieved remission: Secondary | ICD-10-CM | POA: Diagnosis not present

## 2015-07-04 DIAGNOSIS — Z5112 Encounter for antineoplastic immunotherapy: Secondary | ICD-10-CM

## 2015-07-04 MED ORDER — SODIUM CHLORIDE 0.9 % IV SOLN
Freq: Once | INTRAVENOUS | Status: AC
  Administered 2015-07-04: 15:00:00 via INTRAVENOUS

## 2015-07-04 MED ORDER — HEPARIN SOD (PORK) LOCK FLUSH 100 UNIT/ML IV SOLN
500.0000 [IU] | Freq: Once | INTRAVENOUS | Status: DC | PRN
  Filled 2015-07-04: qty 5

## 2015-07-04 MED ORDER — DEXTROSE 5 % IV SOLN
36.0000 mg/m2 | Freq: Once | INTRAVENOUS | Status: AC
  Administered 2015-07-04: 54 mg via INTRAVENOUS
  Filled 2015-07-04: qty 27

## 2015-07-04 MED ORDER — SODIUM CHLORIDE 0.9 % IV SOLN
Freq: Once | INTRAVENOUS | Status: AC
  Administered 2015-07-04: 16:00:00 via INTRAVENOUS
  Filled 2015-07-04: qty 4

## 2015-07-04 MED ORDER — SODIUM CHLORIDE 0.9 % IJ SOLN
10.0000 mL | INTRAMUSCULAR | Status: DC | PRN
  Filled 2015-07-04: qty 10

## 2015-07-04 NOTE — Patient Instructions (Signed)
[facility] Discharge Instructions for Patients Receiving Chemotherapy  Today you received the following chemotherapy agents Kyprolis.  To help prevent nausea and vomiting after your treatment, we encourage you to take your nausea medication as prescribed.   If you develop nausea and vomiting that is not controlled by your nausea medication, call the clinic.   BELOW ARE SYMPTOMS THAT SHOULD BE REPORTED IMMEDIATELY:  *FEVER GREATER THAN 100.5 F  *CHILLS WITH OR WITHOUT FEVER  NAUSEA AND VOMITING THAT IS NOT CONTROLLED WITH YOUR NAUSEA MEDICATION  *UNUSUAL SHORTNESS OF BREATH  *UNUSUAL BRUISING OR BLEEDING  TENDERNESS IN MOUTH AND THROAT WITH OR WITHOUT PRESENCE OF ULCERS  *URINARY PROBLEMS  *BOWEL PROBLEMS  UNUSUAL RASH Items with * indicate a potential emergency and should be followed up as soon as possible.  Feel free to call the clinic you have any questions or concerns. The clinic phone number is [phone].  Please show the CHEMO ALERT CARD at check-in to the Emergency Department and triage nurse.   

## 2015-07-04 NOTE — Progress Notes (Signed)
Kdur called to local pharmacy adn notified daughter [name] . Instructions given to [name] for coumadin dose adjustment.

## 2015-07-05 DIAGNOSIS — B351 Tinea unguium: Secondary | ICD-10-CM | POA: Diagnosis not present

## 2015-07-05 DIAGNOSIS — E119 Type 2 diabetes mellitus without complications: Secondary | ICD-10-CM | POA: Diagnosis not present

## 2015-07-10 ENCOUNTER — Ambulatory Visit (HOSPITAL_BASED_OUTPATIENT_CLINIC_OR_DEPARTMENT_OTHER): Payer: Medicare Other

## 2015-07-10 ENCOUNTER — Other Ambulatory Visit (HOSPITAL_BASED_OUTPATIENT_CLINIC_OR_DEPARTMENT_OTHER): Payer: Medicare Other

## 2015-07-10 VITALS — BP 179/62 | HR 74 | Temp 97.7°F | Resp 18

## 2015-07-10 DIAGNOSIS — C9000 Multiple myeloma not having achieved remission: Secondary | ICD-10-CM

## 2015-07-10 DIAGNOSIS — Z5112 Encounter for antineoplastic immunotherapy: Secondary | ICD-10-CM

## 2015-07-10 DIAGNOSIS — Z5111 Encounter for antineoplastic chemotherapy: Secondary | ICD-10-CM

## 2015-07-10 LAB — CBC WITH DIFFERENTIAL/PLATELET
BASO%: 0.1 % (ref 0.0–2.0)
BASOS ABS: 0 10*3/uL (ref 0.0–0.1)
EOS%: 0 % (ref 0.0–7.0)
Eosinophils Absolute: 0 10*3/uL (ref 0.0–0.5)
HCT: 33.1 % — ABNORMAL LOW (ref 34.8–46.6)
HGB: 10.8 g/dL — ABNORMAL LOW (ref 11.6–15.9)
LYMPH%: 3.7 % — AB (ref 14.0–49.7)
MCH: 31.6 pg (ref 25.1–34.0)
MCHC: 32.6 g/dL (ref 31.5–36.0)
MCV: 97 fL (ref 79.5–101.0)
MONO#: 0 10*3/uL — AB (ref 0.1–0.9)
MONO%: 0.8 % (ref 0.0–14.0)
NEUT%: 95.4 % — AB (ref 38.4–76.8)
NEUTROS ABS: 4.8 10*3/uL (ref 1.5–6.5)
Platelets: 179 10*3/uL (ref 145–400)
RBC: 3.41 10*6/uL — AB (ref 3.70–5.45)
RDW: 15.7 % — ABNORMAL HIGH (ref 11.2–14.5)
WBC: 5 10*3/uL (ref 3.9–10.3)
lymph#: 0.2 10*3/uL — ABNORMAL LOW (ref 0.9–3.3)

## 2015-07-10 LAB — COMPREHENSIVE METABOLIC PANEL (CC13)
ALT: 22 U/L (ref 0–55)
AST: 16 U/L (ref 5–34)
Albumin: 3.3 g/dL — ABNORMAL LOW (ref 3.5–5.0)
Alkaline Phosphatase: 64 U/L (ref 40–150)
Anion Gap: 10 mEq/L (ref 3–11)
BILIRUBIN TOTAL: 0.31 mg/dL (ref 0.20–1.20)
BUN: 14.9 mg/dL (ref 7.0–26.0)
CO2: 25 meq/L (ref 22–29)
CREATININE: 1 mg/dL (ref 0.6–1.1)
Calcium: 10.1 mg/dL (ref 8.4–10.4)
Chloride: 106 mEq/L (ref 98–109)
EGFR: 62 mL/min/{1.73_m2} — ABNORMAL LOW (ref >90)
GLUCOSE: 294 mg/dL — AB (ref 70–140)
Potassium: 4.3 mEq/L (ref 3.5–5.1)
SODIUM: 141 meq/L (ref 136–145)
TOTAL PROTEIN: 5.9 g/dL — AB (ref 6.4–8.3)

## 2015-07-10 MED ORDER — SODIUM CHLORIDE 0.9 % IV SOLN
Freq: Once | INTRAVENOUS | Status: AC
  Administered 2015-07-10: 15:00:00 via INTRAVENOUS

## 2015-07-10 MED ORDER — SODIUM CHLORIDE 0.9 % IV SOLN: Freq: Once | INTRAVENOUS | Status: DC

## 2015-07-10 MED ORDER — DEXTROSE 5 % IV SOLN
36.0000 mg/m2 | Freq: Once | INTRAVENOUS | Status: AC
  Administered 2015-07-10: 54 mg via INTRAVENOUS
  Filled 2015-07-10: qty 27

## 2015-07-10 MED ORDER — SODIUM CHLORIDE 0.9 % IV SOLN
Freq: Once | INTRAVENOUS | Status: AC
  Administered 2015-07-10: 15:00:00 via INTRAVENOUS
  Filled 2015-07-10: qty 4

## 2015-07-10 MED ORDER — SODIUM CHLORIDE 0.9 % IV SOLN
300.0000 mg/m2 | Freq: Once | INTRAVENOUS | Status: AC
  Administered 2015-07-10: 440 mg via INTRAVENOUS
  Filled 2015-07-10: qty 22

## 2015-07-10 NOTE — Patient Instructions (Signed)
[facility] Discharge Instructions for Patients Receiving Chemotherapy  Today you received the following chemotherapy agents: cytoxan, kyprolis  To help prevent nausea and vomiting after your treatment, we encourage you to take your nausea medication.  Take it as often as prescribed.     If you develop nausea and vomiting that is not controlled by your nausea medication, call the clinic. If it is after clinic hours your family physician or the after hours number for the clinic or go to the Emergency Department.   BELOW ARE SYMPTOMS THAT SHOULD BE REPORTED IMMEDIATELY:  *FEVER GREATER THAN 100.5 F  *CHILLS WITH OR WITHOUT FEVER  NAUSEA AND VOMITING THAT IS NOT CONTROLLED WITH YOUR NAUSEA MEDICATION  *UNUSUAL SHORTNESS OF BREATH  *UNUSUAL BRUISING OR BLEEDING  TENDERNESS IN MOUTH AND THROAT WITH OR WITHOUT PRESENCE OF ULCERS  *URINARY PROBLEMS  *BOWEL PROBLEMS  UNUSUAL RASH Items with * indicate a potential emergency and should be followed up as soon as possible.  Feel free to call the clinic you have any questions or concerns. The clinic phone number is [phone].   I have been informed and understand all the instructions given to me. I know to contact the clinic, my physician, or go to the Emergency Department if any problems should occur. I do not have any questions at this time, but understand that I may call the clinic during office hours   should I have any questions or need assistance in obtaining follow up care.    __________________________________________  _____________  __________ Signature of Patient or Authorized Representative            Date                   Time    __________________________________________ Nurse's Signature    

## 2015-07-11 ENCOUNTER — Ambulatory Visit (HOSPITAL_BASED_OUTPATIENT_CLINIC_OR_DEPARTMENT_OTHER): Payer: Medicare Other

## 2015-07-11 ENCOUNTER — Other Ambulatory Visit: Payer: Self-pay | Admitting: Internal Medicine

## 2015-07-11 VITALS — BP 181/52 | HR 63 | Temp 97.7°F | Resp 18

## 2015-07-11 DIAGNOSIS — Z5112 Encounter for antineoplastic immunotherapy: Secondary | ICD-10-CM | POA: Diagnosis not present

## 2015-07-11 DIAGNOSIS — C9000 Multiple myeloma not having achieved remission: Secondary | ICD-10-CM

## 2015-07-11 MED ORDER — SODIUM CHLORIDE 0.9 % IV SOLN
Freq: Once | INTRAVENOUS | Status: AC
  Administered 2015-07-11: 15:00:00 via INTRAVENOUS

## 2015-07-11 MED ORDER — OXYCODONE HCL 5 MG PO TABS: ORAL_TABLET | ORAL | Status: DC

## 2015-07-11 MED ORDER — SODIUM CHLORIDE 0.9 % IV SOLN
Freq: Once | INTRAVENOUS | Status: AC
  Administered 2015-07-11: 15:00:00 via INTRAVENOUS
  Filled 2015-07-11: qty 4

## 2015-07-11 MED ORDER — CARFILZOMIB CHEMO INJECTION 60 MG
36.0000 mg/m2 | Freq: Once | INTRAVENOUS | Status: AC
  Administered 2015-07-11: 54 mg via INTRAVENOUS
  Filled 2015-07-11: qty 27

## 2015-07-11 NOTE — Telephone Encounter (Signed)
Pt daughter call to refill Rx for Oxycodone '5mg'$  1-2 tablets q 6hrs. Q#90. Last refill 10/31. Rx cannot be filled until Friday 07/13/15 Reviewed with MD. Caralyn Guile [phone] Rx ready to be picked up on Friday 11/11.

## 2015-07-11 NOTE — Patient Instructions (Signed)
[facility] Discharge Instructions for Patients Receiving Chemotherapy  Today you received the following chemotherapy agents: Kyprolis. To help prevent nausea and vomiting after your treatment, we encourage you to take your nausea medication:  Compazine 10 mg every 6 hours as needed.   If you develop nausea and vomiting that is not controlled by your nausea medication, call the clinic.   BELOW ARE SYMPTOMS THAT SHOULD BE REPORTED IMMEDIATELY:  *FEVER GREATER THAN 100.5 F  *CHILLS WITH OR WITHOUT FEVER  NAUSEA AND VOMITING THAT IS NOT CONTROLLED WITH YOUR NAUSEA MEDICATION  *UNUSUAL SHORTNESS OF BREATH  *UNUSUAL BRUISING OR BLEEDING  TENDERNESS IN MOUTH AND THROAT WITH OR WITHOUT PRESENCE OF ULCERS  *URINARY PROBLEMS  *BOWEL PROBLEMS  UNUSUAL RASH Items with * indicate a potential emergency and should be followed up as soon as possible.  Feel free to call the clinic you have any questions or concerns. The clinic phone number is [phone].  Please show the [facility] at check-in to the Emergency Department and triage nurse.

## 2015-07-12 ENCOUNTER — Encounter: Payer: Self-pay | Admitting: Internal Medicine

## 2015-07-12 NOTE — Progress Notes (Signed)
I placed fmla form for deborah(daughter) on desk of nurse for dr. Julien Nordmann

## 2015-07-13 ENCOUNTER — Telehealth: Payer: Self-pay | Admitting: *Deleted

## 2015-07-13 ENCOUNTER — Other Ambulatory Visit: Payer: Self-pay | Admitting: Internal Medicine

## 2015-07-13 NOTE — Telephone Encounter (Signed)
TC from pt's daughter, Jerilynn Mages. She states that she wanted to make sure that Dr. Julien Nordmann was aware that Ms. Steenbergen was going to be seeing a 'Vascular'  Doctor on Monday d/t increasing pain in her left foot and left great toe. Pt has been to see her podiatrist and he had told her it may be a 'circulation' issue.  Pt has appt @ 8am on 07/16/15.  Lou Miner to make sure the vascular MD is aware of pt receiving chemo as well as her other medications, and to let us know how the appointment goes. [name] voiced understanding.

## 2015-07-14 ENCOUNTER — Other Ambulatory Visit: Payer: Self-pay | Admitting: Family Medicine

## 2015-07-16 ENCOUNTER — Encounter: Payer: Self-pay | Admitting: Internal Medicine

## 2015-07-16 DIAGNOSIS — I739 Peripheral vascular disease, unspecified: Secondary | ICD-10-CM | POA: Diagnosis not present

## 2015-07-16 DIAGNOSIS — I70211 Atherosclerosis of native arteries of extremities with intermittent claudication, right leg: Secondary | ICD-10-CM | POA: Diagnosis not present

## 2015-07-16 DIAGNOSIS — I10 Essential (primary) hypertension: Secondary | ICD-10-CM | POA: Diagnosis not present

## 2015-07-16 NOTE — Progress Notes (Signed)
I called to let the daughter know the forms are ready for pick up.

## 2015-07-17 ENCOUNTER — Other Ambulatory Visit: Payer: Medicare Other

## 2015-07-18 DIAGNOSIS — I739 Peripheral vascular disease, unspecified: Secondary | ICD-10-CM | POA: Diagnosis not present

## 2015-07-18 DIAGNOSIS — M79609 Pain in unspecified limb: Secondary | ICD-10-CM | POA: Diagnosis not present

## 2015-07-18 DIAGNOSIS — I10 Essential (primary) hypertension: Secondary | ICD-10-CM | POA: Diagnosis not present

## 2015-07-18 DIAGNOSIS — I70211 Atherosclerosis of native arteries of extremities with intermittent claudication, right leg: Secondary | ICD-10-CM | POA: Diagnosis not present

## 2015-07-24 ENCOUNTER — Other Ambulatory Visit: Payer: Self-pay | Admitting: *Deleted

## 2015-07-24 ENCOUNTER — Encounter: Payer: Self-pay | Admitting: Internal Medicine

## 2015-07-24 ENCOUNTER — Other Ambulatory Visit (HOSPITAL_BASED_OUTPATIENT_CLINIC_OR_DEPARTMENT_OTHER): Payer: Medicare Other

## 2015-07-24 ENCOUNTER — Ambulatory Visit (HOSPITAL_BASED_OUTPATIENT_CLINIC_OR_DEPARTMENT_OTHER): Payer: Medicare Other | Admitting: Internal Medicine

## 2015-07-24 ENCOUNTER — Ambulatory Visit (HOSPITAL_BASED_OUTPATIENT_CLINIC_OR_DEPARTMENT_OTHER): Payer: Medicare Other

## 2015-07-24 ENCOUNTER — Telehealth: Payer: Self-pay | Admitting: Internal Medicine

## 2015-07-24 VITALS — BP 145/64 | HR 66 | Temp 98.3°F | Resp 18 | Ht 60.0 in | Wt 114.5 lb

## 2015-07-24 DIAGNOSIS — Z5112 Encounter for antineoplastic immunotherapy: Secondary | ICD-10-CM | POA: Diagnosis not present

## 2015-07-24 DIAGNOSIS — Z7901 Long term (current) use of anticoagulants: Secondary | ICD-10-CM

## 2015-07-24 DIAGNOSIS — C9000 Multiple myeloma not having achieved remission: Secondary | ICD-10-CM

## 2015-07-24 DIAGNOSIS — I82402 Acute embolism and thrombosis of unspecified deep veins of left lower extremity: Secondary | ICD-10-CM | POA: Diagnosis not present

## 2015-07-24 DIAGNOSIS — Z5111 Encounter for antineoplastic chemotherapy: Secondary | ICD-10-CM

## 2015-07-24 LAB — COMPREHENSIVE METABOLIC PANEL (CC13)
ALBUMIN: 3.4 g/dL — AB (ref 3.5–5.0)
ALT: 24 U/L (ref 0–55)
ANION GAP: 13 meq/L — AB (ref 3–11)
AST: 19 U/L (ref 5–34)
Alkaline Phosphatase: 62 U/L (ref 40–150)
BUN: 26.9 mg/dL — ABNORMAL HIGH (ref 7.0–26.0)
CO2: 23 meq/L (ref 22–29)
Calcium: 9.5 mg/dL (ref 8.4–10.4)
Chloride: 104 mEq/L (ref 98–109)
Creatinine: 0.9 mg/dL (ref 0.6–1.1)
EGFR: 71 mL/min/{1.73_m2} — AB (ref >90)
GLUCOSE: 204 mg/dL — AB (ref 70–140)
POTASSIUM: 3.5 meq/L (ref 3.5–5.1)
SODIUM: 139 meq/L (ref 136–145)
TOTAL PROTEIN: 5.9 g/dL — AB (ref 6.4–8.3)
Total Bilirubin: 0.4 mg/dL (ref 0.20–1.20)

## 2015-07-24 LAB — CBC WITH DIFFERENTIAL/PLATELET
BASO%: 0.3 % (ref 0.0–2.0)
Basophils Absolute: 0 10*3/uL (ref 0.0–0.1)
EOS%: 0.1 % (ref 0.0–7.0)
Eosinophils Absolute: 0 10*3/uL (ref 0.0–0.5)
HCT: 35.5 % (ref 34.8–46.6)
HGB: 11.6 g/dL (ref 11.6–15.9)
LYMPH%: 3.6 % — AB (ref 14.0–49.7)
MCH: 31.8 pg (ref 25.1–34.0)
MCHC: 32.8 g/dL (ref 31.5–36.0)
MCV: 97 fL (ref 79.5–101.0)
MONO#: 0.1 10*3/uL (ref 0.1–0.9)
MONO%: 1.7 % (ref 0.0–14.0)
NEUT%: 94.3 % — AB (ref 38.4–76.8)
NEUTROS ABS: 6.8 10*3/uL — AB (ref 1.5–6.5)
PLATELETS: 283 10*3/uL (ref 145–400)
RBC: 3.66 10*6/uL — AB (ref 3.70–5.45)
RDW: 15.6 % — ABNORMAL HIGH (ref 11.2–14.5)
WBC: 7.3 10*3/uL (ref 3.9–10.3)
lymph#: 0.3 10*3/uL — ABNORMAL LOW (ref 0.9–3.3)

## 2015-07-24 LAB — PROTIME-INR
INR: 1.4 — ABNORMAL LOW (ref 2.00–3.50)
PROTIME: 16.8 s — AB (ref 10.6–13.4)

## 2015-07-24 MED ORDER — SODIUM CHLORIDE 0.9 % IV SOLN
300.0000 mg/m2 | Freq: Once | INTRAVENOUS | Status: AC
  Administered 2015-07-24: 440 mg via INTRAVENOUS
  Filled 2015-07-24: qty 22

## 2015-07-24 MED ORDER — SODIUM CHLORIDE 0.9 % IV SOLN
Freq: Once | INTRAVENOUS | Status: AC
  Administered 2015-07-24: 14:00:00 via INTRAVENOUS

## 2015-07-24 MED ORDER — DEXTROSE 5 % IV SOLN
36.0000 mg/m2 | Freq: Once | INTRAVENOUS | Status: AC
  Administered 2015-07-24: 54 mg via INTRAVENOUS
  Filled 2015-07-24: qty 27

## 2015-07-24 MED ORDER — SODIUM CHLORIDE 0.9 % IV SOLN
Freq: Once | INTRAVENOUS | Status: AC
  Administered 2015-07-24: 14:00:00 via INTRAVENOUS
  Filled 2015-07-24: qty 4

## 2015-07-24 MED ORDER — OXYCODONE HCL 5 MG PO TABS: ORAL_TABLET | ORAL | Status: DC

## 2015-07-24 NOTE — Telephone Encounter (Signed)
Daughter call with refill request oxycodone '5mg'$ . Rx ready for pick up

## 2015-07-24 NOTE — Patient Instructions (Addendum)
[facility] Discharge Instructions for Patients Receiving Chemotherapy  Today you received the following chemotherapy agents kyrpolis/cytoxan  To help prevent nausea and vomiting after your treatment, we encourage you to take your nausea medication as directed   If you develop nausea and vomiting that is not controlled by your nausea medication, call the clinic.   BELOW ARE SYMPTOMS THAT SHOULD BE REPORTED IMMEDIATELY:  *FEVER GREATER THAN 100.5 F  *CHILLS WITH OR WITHOUT FEVER  NAUSEA AND VOMITING THAT IS NOT CONTROLLED WITH YOUR NAUSEA MEDICATION  *UNUSUAL SHORTNESS OF BREATH  *UNUSUAL BRUISING OR BLEEDING  TENDERNESS IN MOUTH AND THROAT WITH OR WITHOUT PRESENCE OF ULCERS  *URINARY PROBLEMS  *BOWEL PROBLEMS  UNUSUAL RASH Items with * indicate a potential emergency and should be followed up as soon as possible.  Feel free to call the clinic you have any questions or concerns. The clinic phone number is [phone].

## 2015-07-24 NOTE — Progress Notes (Signed)
[facility] [phone]   [phone]  OFFICE PROGRESS NOTE  [name], MD [place] [address]  DIAGNOSIS:  1. Multiple myeloma diagnosed in September 2009,  2. history of bilateral lower extremity deep vein thrombosis diagnosed in November 2009  3. acute on chronic deep vein thrombosis in the left lower extremity diagnosed in October 2010.  4.  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  PRIOR THERAPY:  1. status post palliative radiotherapy to the right femur and ischail tuberosity, first was done in December 2011 and the second treatment was completed Jan 08 2011, and the third treatment to the right distal femur completed on 04/30/2011  2. status post palliative radiotherapy to the left proximal femur completed 02/28/2011.  3. Revlimid 25 mg by mouth daily for 21 days every 4 weeks in addition to oral Decadron at 40 mg by mouth on a weekly basis status post 40 cycles.  4. weekly subcutaneous Velcade 1.3 mg/M2 status post 60 cycles, discontinued today secondary to mild disease progression. 5. Pomalyst 4 mg by mouth daily for 21 days every 4 weeks, the patient started the first dose on 12/22/2012 , in addition to Decadron 40 mg weekly. Status post 13 cycles.  6. Chemotherapy according to the [facility] [id number] expanded access treatment protocol with Elotuzumab, lenalidomide and dexamthasone. Status post 5 cycles, discontinued today secondary to disease progression.   CURRENT THERAPY:  1. Systemic chemotherapy with Carfilzomib, Cytoxan and Decadron. First cycle 09/05/2014. Status post 11 cycles. 2. Coumadin 5 mg alternating by 2.5 mg every other day by mouth.  3. Zometa 4 mg IV given every 3 months.  INTERVAL HISTORY: [name] 79 y.o. female returns to the clinic today for follow-up visit accompanied by her daughter and son. The patient is tolerating  her current treatment with Carfilzomib, Cytoxan and Decadron fairly well with no significant adverse effect fairly well.  She was recently found to have some peripheral vascular disease in the left lower extremity and the patient is a scheduled for vascular surgery at [facility] in the next few weeks. She continues to complain of generalized fatigue as well as back pain with radiation to the lower extremities. She is currently on pain medication with oxycodone 2 tablets every 6 hours as needed in addition to occasional ibuprofen. She denied having any significant weight loss or night sweats. She has no nausea or vomiting. The patient denied having any significant chest pain, shortness of breath, cough or hemoptysis.  She is here today to start cycle #12 of her systemic chemotherapy.  MEDICAL HISTORY: Past Medical History  Diagnosis Date  . Blood transfusion   . Depression     Mild  . History of chicken pox   . Hyperlipidemia   . Hypertension   . Degenerative joint disease   . Phlebitis   . Multiple myeloma     per Dr. Julien Nordmann, s/p palliative radiation for leg pain 2011 per Dr. Sondra Come  . Deep vein thrombosis of bilateral lower extremities (HCC)   . Family history of anesthesia complication     DAUGHTER CPR AFTER  . Cardiomyopathy ([place])   . Diabetes mellitus     Steroid related  . UTI (urinary tract infection) 09/08/2013  . History of radiation therapy 08/30/13-09/20/13    20 gray to lower lumbar/upper sacrum    ALLERGIES:  has No Known Allergies.  MEDICATIONS:  Current  Outpatient Prescriptions  Medication Sig Dispense Refill  . acyclovir (ZOVIRAX) 400 MG tablet TAKE ONE TABLET BY MOUTH TWICE DAILY 60 tablet 0  . ALPRAZolam (XANAX) 0.5 MG tablet TAKE ONE TABLET BY MOUTH TWICE DAILY AS NEEDED FOR ANXIETY 60 tablet 0  . Calcium Carbonate-Vitamin D 600-400 MG-UNIT per tablet Take 1 tablet by mouth.     . dexamethasone (DECADRON) 4 MG tablet Take 10 tablets (40 mg) weekly on day 1   with Chemotherapy treatment Carfilzomib. 40 tablet 1  . dexamethasone (DECADRON) 4 MG tablet TAKE 10 TABLETS BY MOUTH WEEKLY ON DAY 1 WITH CHEMOTHERAPY TREATMENT CARFILZOMIB 40 tablet 0  . glipiZIDE (GLUCOTROL XL) 2.5 MG 24 hr tablet Take 2.5 mg by mouth daily with breakfast.    . glipiZIDE (GLUCOTROL XL) 2.5 MG 24 hr tablet TAKE ONE TABLET BY MOUTH ONCE DAILY 90 tablet 1  . lisinopril-hydrochlorothiazide (PRINZIDE,ZESTORETIC) 20-12.5 MG per tablet Take 1 tablet by mouth daily. 90 tablet 3  . loperamide (IMODIUM A-D) 2 MG tablet Take 1 tablet (2 mg total) by mouth 4 (four) times daily as needed for diarrhea or loose stools.    Marland Kitchen oxyCODONE (OXY IR/ROXICODONE) 5 MG immediate release tablet Take  1-2 tablets ($RemoveBefo'10mg'BEvvBqUGNsc$  total) by mouth every 6 hours as needed for severe pain 90 tablet 0  . potassium chloride SA (K-DUR,KLOR-CON) 20 MEQ tablet Take 1 tablet (20 mEq total) by mouth once. 7 tablet 0  . prochlorperazine (COMPAZINE) 10 MG tablet Take 1 tablet (10 mg total) by mouth every 6 (six) hours as needed. 30 tablet 0  . temazepam (RESTORIL) 30 MG capsule Take 1 capsule (30 mg total) by mouth at bedtime as needed. 30 capsule 0  . warfarin (COUMADIN) 5 MG tablet TAKE ONE TABLET BY MOUTH ONCE DAILY AS DIRECTED 60 tablet 0   No current facility-administered medications for this visit.   Facility-Administered Medications Ordered in Other Visits  Medication Dose Route Frequency Provider Last Rate Last Dose  . 0.9 %  sodium chloride infusion   Intravenous Once Curt Bears, MD   Stopped at 03/21/15 1720  . 0.9 %  sodium chloride infusion   Intravenous Once Curt Bears, MD        SURGICAL HISTORY:  Past Surgical History  Procedure Laterality Date  . Abdominal hysterectomy    . Ankle fracture surgery Left   . Ear cyst excision Left 04/16/2015    Procedure: EXCISION FACIAL CYST LEFT SIDE ;  Surgeon: Izora Gala, MD;  Location: [facility];  Service: ENT;  Laterality: Left;    REVIEW OF  SYSTEMS:  Constitutional: positive for fatigue Eyes: negative Ears, nose, mouth, throat, and face: negative Respiratory: negative Cardiovascular: negative Gastrointestinal: negative Genitourinary:negative Integument/breast: negative Hematologic/lymphatic: negative Musculoskeletal:positive for bone pain Neurological: negative Behavioral/Psych: negative Endocrine: negative Allergic/Immunologic: negative   PHYSICAL EXAMINATION: General appearance: alert, cooperative and no distress Head: Normocephalic, without obvious abnormality, atraumatic Neck: no adenopathy, no JVD, supple, symmetrical, trachea midline and thyroid not enlarged, symmetric, no tenderness/mass/nodules Lymph nodes: Cervical, supraclavicular, and axillary nodes normal. Resp: clear to auscultation bilaterally Back: symmetric, no curvature. ROM normal. No CVA tenderness. Cardio: regular rate and rhythm, S1, S2 normal, no murmur, click, rub or gallop GI: soft, non-tender; bowel sounds normal; no masses,  no organomegaly Extremities: extremities normal, atraumatic, no cyanosis or edema Neurologic: Alert and oriented X 3, normal strength and tone. Normal symmetric reflexes. Normal coordination and gait  ECOG PERFORMANCE STATUS: 1 - Symptomatic but completely ambulatory  Blood pressure  145/64, pulse 66, temperature 98.3 F (36.8 C), temperature source Oral, resp. rate 18, height 5' (1.524 m), weight 114 lb 8 oz (51.937 kg), SpO2 100 %.  LABORATORY DATA: Lab Results  Component Value Date   WBC 7.3 07/24/2015   HGB 11.6 07/24/2015   HCT 35.5 07/24/2015   MCV 97.0 07/24/2015   PLT 283 07/24/2015      Chemistry      Component Value Date/Time   NA 141 07/10/2015 1406   NA 140 04/16/2015 1039   K 4.3 07/10/2015 1406   K 4.1 04/16/2015 1039   CL 95* 04/16/2015 1039   CL 107 02/14/2013 1311   CO2 25 07/10/2015 1406   CO2 27 04/12/2015 1645   BUN 14.9 07/10/2015 1406   BUN 22* 04/16/2015 1039   CREATININE 1.0  07/10/2015 1406   CREATININE 0.80 04/16/2015 1039      Component Value Date/Time   CALCIUM 10.1 07/10/2015 1406   CALCIUM 9.1 04/12/2015 1645   CALCIUM * 06/13/2008 1605    5.7 CRITICAL RESULT CALLED TO, READ BACK BY AND VERIFIED WITH: JAMIE TRACY,RN [id number] @ 0973 BY J SCOTTON (NOTE)  Amended report. Result repeated and verified. CORRECTED ON 10/14 AT 1429: PREVIOUSLY REPORTED AS Result repeated and verified.   ALKPHOS 64 07/10/2015 1406   ALKPHOS 49 09/19/2014 1447   AST 16 07/10/2015 1406   AST 18 09/19/2014 1447   ALT 22 07/10/2015 1406   ALT 24 09/19/2014 1447   BILITOT 0.31 07/10/2015 1406   BILITOT 0.4 09/19/2014 1447      RADIOGRAPHIC STUDIES: No results found.  ASSESSMENT AND PLAN: This is a very pleasant 79 years old African-American female with history of multiple myeloma status post several chemotherapy regimens lastly including systemic chemotherapy according to the [facility] [place] 204143 clinical trial with Elotuzumab, Revlimid and dexamethasone. She status post 5 cycles and tolerating her treatment fairly well. This was discontinued secondary to disease progression. Unfortunately the recent myeloma panel showed significant increase in the free lambda light chain. I discussed the lab result with the patient and her daughters. She completed treatment with Elotuzumab, Revlimid and dexamethasone but this was discontinued secondary to disease progression. The patient was started on treatment with systemic chemotherapy with Carfilzomib, Cytoxan and dexamethasone as salvage therapy. She is status post 11 cycles and tolerating this treatment fairly well.  I recommended for the patient to proceed with cycle #12 today as a scheduled.  For the low back pain, the patient will continue on oxycodone every 6 hours as needed. She would come back for follow-up visit in one month for reevaluation before starting cycle #13 after repeating myeloma panel. For the history of deep venous thrombosis,  her PT/INR is low today at 1.4, the patient will continue on Coumadin 5 mg alternating with 2.5 mg every other day by mouth. She was advised to call immediately if she has any concerning symptoms in the interval. The patient voices understanding of current disease status and treatment options and is in agreement with the current care plan.  All questions were answered. The patient knows to call the clinic with any problems, questions or concerns. We can certainly see the patient much sooner if necessary.  Disclaimer: This note was dictated with voice recognition software. Similar sounding words can inadvertently be transcribed and may not be corrected upon review.

## 2015-07-24 NOTE — Telephone Encounter (Signed)
Gave and pirnted appt sched and avs for pt for NOV and DEC

## 2015-07-25 ENCOUNTER — Ambulatory Visit (HOSPITAL_BASED_OUTPATIENT_CLINIC_OR_DEPARTMENT_OTHER): Payer: Medicare Other

## 2015-07-25 VITALS — BP 176/78 | HR 73 | Temp 98.6°F | Resp 18

## 2015-07-25 DIAGNOSIS — C9000 Multiple myeloma not having achieved remission: Secondary | ICD-10-CM | POA: Diagnosis not present

## 2015-07-25 DIAGNOSIS — Z5112 Encounter for antineoplastic immunotherapy: Secondary | ICD-10-CM | POA: Diagnosis not present

## 2015-07-25 MED ORDER — SODIUM CHLORIDE 0.9 % IV SOLN
Freq: Once | INTRAVENOUS | Status: AC
  Administered 2015-07-25: 15:00:00 via INTRAVENOUS
  Filled 2015-07-25: qty 4

## 2015-07-25 MED ORDER — SODIUM CHLORIDE 0.9 % IV SOLN
Freq: Once | INTRAVENOUS | Status: AC
  Administered 2015-07-25: 15:00:00 via INTRAVENOUS

## 2015-07-25 MED ORDER — SODIUM CHLORIDE 0.9 % IV SOLN: Freq: Once | INTRAVENOUS | Status: DC

## 2015-07-25 MED ORDER — DEXTROSE 5 % IV SOLN
36.0000 mg/m2 | Freq: Once | INTRAVENOUS | Status: AC
  Administered 2015-07-25: 54 mg via INTRAVENOUS
  Filled 2015-07-25: qty 27

## 2015-07-25 NOTE — Patient Instructions (Signed)
[facility] Discharge Instructions for Patients Receiving Chemotherapy  Today you received the following chemotherapy agents Kyprolis  To help prevent nausea and vomiting after your treatment, we encourage you to take your nausea medication    If you develop nausea and vomiting that is not controlled by your nausea medication, call the clinic.   BELOW ARE SYMPTOMS THAT SHOULD BE REPORTED IMMEDIATELY:  *FEVER GREATER THAN 100.5 F  *CHILLS WITH OR WITHOUT FEVER  NAUSEA AND VOMITING THAT IS NOT CONTROLLED WITH YOUR NAUSEA MEDICATION  *UNUSUAL SHORTNESS OF BREATH  *UNUSUAL BRUISING OR BLEEDING  TENDERNESS IN MOUTH AND THROAT WITH OR WITHOUT PRESENCE OF ULCERS  *URINARY PROBLEMS  *BOWEL PROBLEMS  UNUSUAL RASH Items with * indicate a potential emergency and should be followed up as soon as possible.  Feel free to call the clinic you have any questions or concerns. The clinic phone number is [phone].  Please show the CHEMO ALERT CARD at check-in to the Emergency Department and triage nurse.   

## 2015-07-31 ENCOUNTER — Other Ambulatory Visit: Payer: Self-pay | Admitting: Vascular Surgery

## 2015-07-31 ENCOUNTER — Other Ambulatory Visit (HOSPITAL_BASED_OUTPATIENT_CLINIC_OR_DEPARTMENT_OTHER): Payer: Medicare Other

## 2015-07-31 ENCOUNTER — Telehealth: Payer: Self-pay | Admitting: Medical Oncology

## 2015-07-31 ENCOUNTER — Ambulatory Visit (HOSPITAL_BASED_OUTPATIENT_CLINIC_OR_DEPARTMENT_OTHER): Payer: Medicare Other

## 2015-07-31 VITALS — BP 140/59 | HR 87 | Temp 98.3°F

## 2015-07-31 DIAGNOSIS — Z5112 Encounter for antineoplastic immunotherapy: Secondary | ICD-10-CM

## 2015-07-31 DIAGNOSIS — C9000 Multiple myeloma not having achieved remission: Secondary | ICD-10-CM | POA: Diagnosis not present

## 2015-07-31 DIAGNOSIS — Z7901 Long term (current) use of anticoagulants: Secondary | ICD-10-CM

## 2015-07-31 LAB — CBC WITH DIFFERENTIAL/PLATELET
BASO%: 0 % (ref 0.0–2.0)
BASOS ABS: 0 10*3/uL (ref 0.0–0.1)
EOS ABS: 0 10*3/uL (ref 0.0–0.5)
EOS%: 0 % (ref 0.0–7.0)
HEMATOCRIT: 34.2 % — AB (ref 34.8–46.6)
HGB: 10.9 g/dL — ABNORMAL LOW (ref 11.6–15.9)
LYMPH#: 0.2 10*3/uL — AB (ref 0.9–3.3)
LYMPH%: 3.4 % — ABNORMAL LOW (ref 14.0–49.7)
MCH: 31.1 pg (ref 25.1–34.0)
MCHC: 32 g/dL (ref 31.5–36.0)
MCV: 97.3 fL (ref 79.5–101.0)
MONO#: 0.1 10*3/uL (ref 0.1–0.9)
MONO%: 0.9 % (ref 0.0–14.0)
NEUT#: 6.3 10*3/uL (ref 1.5–6.5)
NEUT%: 95.7 % — AB (ref 38.4–76.8)
PLATELETS: 124 10*3/uL — AB (ref 145–400)
RBC: 3.51 10*6/uL — ABNORMAL LOW (ref 3.70–5.45)
RDW: 15.8 % — ABNORMAL HIGH (ref 11.2–14.5)
WBC: 6.6 10*3/uL (ref 3.9–10.3)

## 2015-07-31 LAB — COMPREHENSIVE METABOLIC PANEL (CC13)
ALK PHOS: 55 U/L (ref 40–150)
ALT: 16 U/L (ref 0–55)
ANION GAP: 13 meq/L — AB (ref 3–11)
AST: 16 U/L (ref 5–34)
Albumin: 3.1 g/dL — ABNORMAL LOW (ref 3.5–5.0)
BILIRUBIN TOTAL: 0.34 mg/dL (ref 0.20–1.20)
BUN: 23.3 mg/dL (ref 7.0–26.0)
CO2: 23 meq/L (ref 22–29)
Calcium: 9.6 mg/dL (ref 8.4–10.4)
Chloride: 102 mEq/L (ref 98–109)
Creatinine: 1.1 mg/dL (ref 0.6–1.1)
EGFR: 55 mL/min/{1.73_m2} — AB (ref >90)
GLUCOSE: 355 mg/dL — AB (ref 70–140)
POTASSIUM: 3.9 meq/L (ref 3.5–5.1)
SODIUM: 139 meq/L (ref 136–145)
TOTAL PROTEIN: 5.8 g/dL — AB (ref 6.4–8.3)

## 2015-07-31 LAB — PROTIME-INR
INR: 2 (ref 2.00–3.50)
Protime: 24 Seconds — ABNORMAL HIGH (ref 10.6–13.4)

## 2015-07-31 MED ORDER — SODIUM CHLORIDE 0.9 % IV SOLN: Freq: Once | INTRAVENOUS | Status: DC

## 2015-07-31 MED ORDER — SODIUM CHLORIDE 0.9 % IV SOLN
300.0000 mg/m2 | Freq: Once | INTRAVENOUS | Status: AC
  Administered 2015-07-31: 440 mg via INTRAVENOUS
  Filled 2015-07-31: qty 22

## 2015-07-31 MED ORDER — CARFILZOMIB CHEMO INJECTION 60 MG
36.0000 mg/m2 | Freq: Once | INTRAVENOUS | Status: AC
  Administered 2015-07-31: 54 mg via INTRAVENOUS
  Filled 2015-07-31: qty 27

## 2015-07-31 MED ORDER — SODIUM CHLORIDE 0.9 % IV SOLN
Freq: Once | INTRAVENOUS | Status: AC
  Administered 2015-07-31: 15:00:00 via INTRAVENOUS
  Filled 2015-07-31: qty 4

## 2015-07-31 MED ORDER — SODIUM CHLORIDE 0.9 % IV SOLN
Freq: Once | INTRAVENOUS | Status: AC
  Administered 2015-07-31: 15:00:00 via INTRAVENOUS

## 2015-07-31 NOTE — Telephone Encounter (Signed)
Per Julien Nordmann , I left [name] a voice mail for Bertice to stay on same dose of coumadin.

## 2015-07-31 NOTE — Patient Instructions (Signed)
[facility] Discharge Instructions for Patients Receiving Chemotherapy  Today you received the following chemotherapy agents; Kyprolis, Cytoxan.   To help prevent nausea and vomiting after your treatment, we encourage you to take your nausea medication as directed.    If you develop nausea and vomiting that is not controlled by your nausea medication, call the clinic.   BELOW ARE SYMPTOMS THAT SHOULD BE REPORTED IMMEDIATELY:  *FEVER GREATER THAN 100.5 F  *CHILLS WITH OR WITHOUT FEVER  NAUSEA AND VOMITING THAT IS NOT CONTROLLED WITH YOUR NAUSEA MEDICATION  *UNUSUAL SHORTNESS OF BREATH  *UNUSUAL BRUISING OR BLEEDING  TENDERNESS IN MOUTH AND THROAT WITH OR WITHOUT PRESENCE OF ULCERS  *URINARY PROBLEMS  *BOWEL PROBLEMS  UNUSUAL RASH Items with * indicate a potential emergency and should be followed up as soon as possible.  Feel free to call the clinic you have any questions or concerns. The clinic phone number is [phone].  Please show the [facility] at check-in to the Emergency Department and triage nurse.

## 2015-08-01 ENCOUNTER — Ambulatory Visit (HOSPITAL_BASED_OUTPATIENT_CLINIC_OR_DEPARTMENT_OTHER): Payer: Medicare Other

## 2015-08-01 VITALS — BP 171/67 | HR 77 | Temp 97.8°F | Resp 17

## 2015-08-01 DIAGNOSIS — Z5112 Encounter for antineoplastic immunotherapy: Secondary | ICD-10-CM | POA: Diagnosis not present

## 2015-08-01 DIAGNOSIS — C9000 Multiple myeloma not having achieved remission: Secondary | ICD-10-CM

## 2015-08-01 MED ORDER — DEXTROSE 5 % IV SOLN
36.0000 mg/m2 | Freq: Once | INTRAVENOUS | Status: AC
  Administered 2015-08-01: 54 mg via INTRAVENOUS
  Filled 2015-08-01: qty 27

## 2015-08-01 MED ORDER — SODIUM CHLORIDE 0.9 % IV SOLN
Freq: Once | INTRAVENOUS | Status: AC
Start: 2015-08-01 — End: 2015-08-01
  Administered 2015-08-01: 15:00:00 via INTRAVENOUS

## 2015-08-01 MED ORDER — SODIUM CHLORIDE 0.9 % IV SOLN
Freq: Once | INTRAVENOUS | Status: AC
  Administered 2015-08-01: 15:00:00 via INTRAVENOUS
  Filled 2015-08-01: qty 4

## 2015-08-01 MED ORDER — SODIUM CHLORIDE 0.9 % IV SOLN: Freq: Once | INTRAVENOUS | Status: DC

## 2015-08-01 NOTE — Patient Instructions (Signed)
[name] Discharge Instructions for Patients Receiving Chemotherapy  Today you received the following chemotherapy agents: Kyprolis   To help prevent nausea and vomiting after your treatment, we encourage you to take your nausea medication as directed.    If you develop nausea and vomiting that is not controlled by your nausea medication, call the clinic.   BELOW ARE SYMPTOMS THAT SHOULD BE REPORTED IMMEDIATELY:  *FEVER GREATER THAN 100.5 F  *CHILLS WITH OR WITHOUT FEVER  NAUSEA AND VOMITING THAT IS NOT CONTROLLED WITH YOUR NAUSEA MEDICATION  *UNUSUAL SHORTNESS OF BREATH  *UNUSUAL BRUISING OR BLEEDING  TENDERNESS IN MOUTH AND THROAT WITH OR WITHOUT PRESENCE OF ULCERS  *URINARY PROBLEMS  *BOWEL PROBLEMS  UNUSUAL RASH Items with * indicate a potential emergency and should be followed up as soon as possible.  Feel free to call the clinic you have any questions or concerns. The clinic phone number is [phone].  Please show the CHEMO ALERT CARD at check-in to the Emergency Department and triage nurse.   

## 2015-08-05 ENCOUNTER — Other Ambulatory Visit: Payer: Self-pay | Admitting: Internal Medicine

## 2015-08-06 ENCOUNTER — Other Ambulatory Visit: Payer: Self-pay | Admitting: Medical Oncology

## 2015-08-06 ENCOUNTER — Telehealth: Payer: Self-pay | Admitting: Medical Oncology

## 2015-08-06 DIAGNOSIS — C9000 Multiple myeloma not having achieved remission: Secondary | ICD-10-CM

## 2015-08-06 MED ORDER — ALPRAZOLAM 0.5 MG PO TABS: 0.5000 mg | ORAL_TABLET | Freq: Two times a day (BID) | ORAL | Status: DC | PRN

## 2015-08-06 MED ORDER — OXYCODONE HCL 5 MG PO TABS: ORAL_TABLET | ORAL | Status: DC

## 2015-08-06 NOTE — Telephone Encounter (Signed)
Prescriptions refilled and [facility] to [facility] med locked up.Audra notified.

## 2015-08-07 ENCOUNTER — Other Ambulatory Visit (HOSPITAL_BASED_OUTPATIENT_CLINIC_OR_DEPARTMENT_OTHER): Payer: Medicare Other

## 2015-08-07 ENCOUNTER — Ambulatory Visit (HOSPITAL_BASED_OUTPATIENT_CLINIC_OR_DEPARTMENT_OTHER): Payer: Medicare Other

## 2015-08-07 DIAGNOSIS — Z5112 Encounter for antineoplastic immunotherapy: Secondary | ICD-10-CM | POA: Diagnosis not present

## 2015-08-07 DIAGNOSIS — C9000 Multiple myeloma not having achieved remission: Secondary | ICD-10-CM | POA: Diagnosis not present

## 2015-08-07 DIAGNOSIS — Z5111 Encounter for antineoplastic chemotherapy: Secondary | ICD-10-CM | POA: Diagnosis not present

## 2015-08-07 DIAGNOSIS — Z7901 Long term (current) use of anticoagulants: Secondary | ICD-10-CM

## 2015-08-07 DIAGNOSIS — I82402 Acute embolism and thrombosis of unspecified deep veins of left lower extremity: Secondary | ICD-10-CM

## 2015-08-07 LAB — COMPREHENSIVE METABOLIC PANEL
ALT: 17 U/L (ref 0–55)
ANION GAP: 13 meq/L — AB (ref 3–11)
AST: 18 U/L (ref 5–34)
Albumin: 3.4 g/dL — ABNORMAL LOW (ref 3.5–5.0)
Alkaline Phosphatase: 60 U/L (ref 40–150)
BUN: 21.1 mg/dL (ref 7.0–26.0)
CALCIUM: 9.7 mg/dL (ref 8.4–10.4)
CHLORIDE: 106 meq/L (ref 98–109)
CO2: 22 mEq/L (ref 22–29)
Creatinine: 1.1 mg/dL (ref 0.6–1.1)
EGFR: 54 mL/min/{1.73_m2} — ABNORMAL LOW (ref >90)
Glucose: 380 mg/dl — ABNORMAL HIGH (ref 70–140)
POTASSIUM: 4.1 meq/L (ref 3.5–5.1)
Sodium: 140 mEq/L (ref 136–145)
Total Bilirubin: 0.35 mg/dL (ref 0.20–1.20)
Total Protein: 6.2 g/dL — ABNORMAL LOW (ref 6.4–8.3)

## 2015-08-07 LAB — CBC WITH DIFFERENTIAL/PLATELET
BASO%: 0.1 % (ref 0.0–2.0)
Basophils Absolute: 0 10*3/uL (ref 0.0–0.1)
EOS ABS: 0 10*3/uL (ref 0.0–0.5)
EOS%: 0 % (ref 0.0–7.0)
HCT: 33.6 % — ABNORMAL LOW (ref 34.8–46.6)
HEMOGLOBIN: 10.8 g/dL — AB (ref 11.6–15.9)
LYMPH#: 0.1 10*3/uL — AB (ref 0.9–3.3)
LYMPH%: 3.2 % — AB (ref 14.0–49.7)
MCH: 31.5 pg (ref 25.1–34.0)
MCHC: 32.1 g/dL (ref 31.5–36.0)
MCV: 98.2 fL (ref 79.5–101.0)
MONO#: 0 10*3/uL — ABNORMAL LOW (ref 0.1–0.9)
MONO%: 1 % (ref 0.0–14.0)
NEUT%: 95.7 % — ABNORMAL HIGH (ref 38.4–76.8)
NEUTROS ABS: 4.1 10*3/uL (ref 1.5–6.5)
Platelets: 180 10*3/uL (ref 145–400)
RBC: 3.42 10*6/uL — ABNORMAL LOW (ref 3.70–5.45)
RDW: 16.1 % — AB (ref 11.2–14.5)
WBC: 4.3 10*3/uL (ref 3.9–10.3)

## 2015-08-07 LAB — PROTIME-INR
INR: 1.8 — AB (ref 2.00–3.50)
PROTIME: 21.6 s — AB (ref 10.6–13.4)

## 2015-08-07 MED ORDER — SODIUM CHLORIDE 0.9 % IV SOLN
Freq: Once | INTRAVENOUS | Status: AC
  Administered 2015-08-07: 15:00:00 via INTRAVENOUS
  Filled 2015-08-07: qty 4

## 2015-08-07 MED ORDER — SODIUM CHLORIDE 0.9 % IV SOLN
Freq: Once | INTRAVENOUS | Status: DC
Start: 2015-08-07 — End: 2015-08-07

## 2015-08-07 MED ORDER — SODIUM CHLORIDE 0.9 % IV SOLN
300.0000 mg/m2 | Freq: Once | INTRAVENOUS | Status: AC
  Administered 2015-08-07: 440 mg via INTRAVENOUS
  Filled 2015-08-07: qty 22

## 2015-08-07 MED ORDER — DEXTROSE 5 % IV SOLN
36.0000 mg/m2 | Freq: Once | INTRAVENOUS | Status: AC
  Administered 2015-08-07: 54 mg via INTRAVENOUS
  Filled 2015-08-07: qty 27

## 2015-08-07 MED ORDER — SODIUM CHLORIDE 0.9 % IV SOLN
Freq: Once | INTRAVENOUS | Status: AC
  Administered 2015-08-07: 15:00:00 via INTRAVENOUS

## 2015-08-07 NOTE — Patient Instructions (Signed)
[facility] Discharge Instructions for Patients Receiving Chemotherapy  Today you received the following chemotherapy agents; Kyprolis, Cytoxan.   To help prevent nausea and vomiting after your treatment, we encourage you to take your nausea medication as directed.    If you develop nausea and vomiting that is not controlled by your nausea medication, call the clinic.   BELOW ARE SYMPTOMS THAT SHOULD BE REPORTED IMMEDIATELY:  *FEVER GREATER THAN 100.5 F  *CHILLS WITH OR WITHOUT FEVER  NAUSEA AND VOMITING THAT IS NOT CONTROLLED WITH YOUR NAUSEA MEDICATION  *UNUSUAL SHORTNESS OF BREATH  *UNUSUAL BRUISING OR BLEEDING  TENDERNESS IN MOUTH AND THROAT WITH OR WITHOUT PRESENCE OF ULCERS  *URINARY PROBLEMS  *BOWEL PROBLEMS  UNUSUAL RASH Items with * indicate a potential emergency and should be followed up as soon as possible.  Feel free to call the clinic you have any questions or concerns. The clinic phone number is [phone].  Please show the [facility] at check-in to the Emergency Department and triage nurse.

## 2015-08-07 NOTE — Progress Notes (Deleted)
Phlebotomy. 500 ml pulled from patient with 16g PIV to Left AC. Procedure started at 1355 and ended at 1602. Patient given snack and encouraged fluids. Observed for 30 minutes afterwards. Vital Signs stable.

## 2015-08-08 ENCOUNTER — Ambulatory Visit (HOSPITAL_BASED_OUTPATIENT_CLINIC_OR_DEPARTMENT_OTHER): Payer: Medicare Other

## 2015-08-08 VITALS — BP 132/64 | HR 85 | Temp 98.9°F | Resp 18

## 2015-08-08 DIAGNOSIS — Z5112 Encounter for antineoplastic immunotherapy: Secondary | ICD-10-CM | POA: Diagnosis not present

## 2015-08-08 DIAGNOSIS — C9000 Multiple myeloma not having achieved remission: Secondary | ICD-10-CM

## 2015-08-08 MED ORDER — DEXTROSE 5 % IV SOLN
36.0000 mg/m2 | Freq: Once | INTRAVENOUS | Status: AC
  Administered 2015-08-08: 54 mg via INTRAVENOUS
  Filled 2015-08-08: qty 27

## 2015-08-08 MED ORDER — SODIUM CHLORIDE 0.9 % IV SOLN
Freq: Once | INTRAVENOUS | Status: AC
  Administered 2015-08-08: 14:00:00 via INTRAVENOUS
  Filled 2015-08-08: qty 4

## 2015-08-08 MED ORDER — SODIUM CHLORIDE 0.9 % IV SOLN
Freq: Once | INTRAVENOUS | Status: AC
  Administered 2015-08-08: 14:00:00 via INTRAVENOUS

## 2015-08-08 NOTE — Patient Instructions (Signed)
[facility] Discharge Instructions for Patients Receiving Chemotherapy  Today you received the following chemotherapy agents: Kyprolis   To help prevent nausea and vomiting after your treatment, we encourage you to take your nausea medication as directed.    If you develop nausea and vomiting that is not controlled by your nausea medication, call the clinic.   BELOW ARE SYMPTOMS THAT SHOULD BE REPORTED IMMEDIATELY:  *FEVER GREATER THAN 100.5 F  *CHILLS WITH OR WITHOUT FEVER  NAUSEA AND VOMITING THAT IS NOT CONTROLLED WITH YOUR NAUSEA MEDICATION  *UNUSUAL SHORTNESS OF BREATH  *UNUSUAL BRUISING OR BLEEDING  TENDERNESS IN MOUTH AND THROAT WITH OR WITHOUT PRESENCE OF ULCERS  *URINARY PROBLEMS  *BOWEL PROBLEMS  UNUSUAL RASH Items with * indicate a potential emergency and should be followed up as soon as possible.  Feel free to call the clinic you have any questions or concerns. The clinic phone number is [phone].  Please show the CHEMO ALERT CARD at check-in to the Emergency Department and triage nurse.   

## 2015-08-08 NOTE — Progress Notes (Signed)
Note entered in error

## 2015-08-13 ENCOUNTER — Other Ambulatory Visit
Admission: RE | Admit: 2015-08-13 | Discharge: 2015-08-13 | Disposition: A | Discharge status: Home / Self Care | Payer: Medicare Other | Source: Ambulatory Visit | Attending: Vascular Surgery | Admitting: Vascular Surgery

## 2015-08-13 ENCOUNTER — Other Ambulatory Visit: Admit: 2015-08-13 | Payer: Self-pay

## 2015-08-13 DIAGNOSIS — E119 Type 2 diabetes mellitus without complications: Secondary | ICD-10-CM | POA: Diagnosis not present

## 2015-08-13 DIAGNOSIS — C9000 Multiple myeloma not having achieved remission: Secondary | ICD-10-CM | POA: Diagnosis not present

## 2015-08-13 DIAGNOSIS — I10 Essential (primary) hypertension: Secondary | ICD-10-CM | POA: Diagnosis not present

## 2015-08-13 DIAGNOSIS — Z79899 Other long term (current) drug therapy: Secondary | ICD-10-CM | POA: Diagnosis not present

## 2015-08-13 DIAGNOSIS — Z7901 Long term (current) use of anticoagulants: Secondary | ICD-10-CM | POA: Diagnosis not present

## 2015-08-13 DIAGNOSIS — I70223 Atherosclerosis of native arteries of extremities with rest pain, bilateral legs: Secondary | ICD-10-CM | POA: Diagnosis not present

## 2015-08-13 LAB — CREATININE, SERUM
CREATININE: 0.75 mg/dL (ref 0.44–1.00)
GFR calc Af Amer: >60 mL/min (ref >60)

## 2015-08-13 LAB — BUN: BUN: 21 mg/dL — AB (ref 6–20)

## 2015-08-14 ENCOUNTER — Ambulatory Visit
Admission: RE | Admit: 2015-08-14 | Discharge: 2015-08-14 | Disposition: A | Discharge status: Home / Self Care | Payer: Medicare Other | Source: Ambulatory Visit | Attending: Vascular Surgery | Admitting: Vascular Surgery

## 2015-08-14 ENCOUNTER — Other Ambulatory Visit: Payer: Self-pay | Admitting: Medical Oncology

## 2015-08-14 ENCOUNTER — Encounter
Admission: RE | Disposition: A | Discharge status: Home / Self Care | Payer: Self-pay | Source: Ambulatory Visit | Attending: Vascular Surgery

## 2015-08-14 ENCOUNTER — Encounter: Payer: Self-pay | Admitting: *Deleted

## 2015-08-14 ENCOUNTER — Other Ambulatory Visit: Payer: Medicare Other

## 2015-08-14 DIAGNOSIS — I70223 Atherosclerosis of native arteries of extremities with rest pain, bilateral legs: Secondary | ICD-10-CM | POA: Insufficient documentation

## 2015-08-14 DIAGNOSIS — Z79899 Other long term (current) drug therapy: Secondary | ICD-10-CM | POA: Insufficient documentation

## 2015-08-14 DIAGNOSIS — Z7901 Long term (current) use of anticoagulants: Secondary | ICD-10-CM | POA: Diagnosis not present

## 2015-08-14 DIAGNOSIS — C9000 Multiple myeloma not having achieved remission: Secondary | ICD-10-CM | POA: Diagnosis not present

## 2015-08-14 DIAGNOSIS — I10 Essential (primary) hypertension: Secondary | ICD-10-CM | POA: Insufficient documentation

## 2015-08-14 DIAGNOSIS — E119 Type 2 diabetes mellitus without complications: Secondary | ICD-10-CM | POA: Diagnosis not present

## 2015-08-14 DIAGNOSIS — M79609 Pain in unspecified limb: Secondary | ICD-10-CM | POA: Diagnosis not present

## 2015-08-14 DIAGNOSIS — I70222 Atherosclerosis of native arteries of extremities with rest pain, left leg: Secondary | ICD-10-CM | POA: Diagnosis not present

## 2015-08-14 HISTORY — PX: PERIPHERAL VASCULAR CATHETERIZATION: SHX172C

## 2015-08-14 LAB — PROTIME-INR
INR: 1.1
Prothrombin Time: 14.4 seconds (ref 11.4–15.0)

## 2015-08-14 LAB — GLUCOSE, CAPILLARY: GLUCOSE-CAPILLARY: 193 mg/dL — AB (ref 65–99)

## 2015-08-14 SURGERY — LOWER EXTREMITY INTERVENTION
Wound class: Clean

## 2015-08-14 MED ORDER — CLOPIDOGREL BISULFATE 75 MG PO TABS
ORAL_TABLET | ORAL | Status: AC
  Filled 2015-08-14: qty 4

## 2015-08-14 MED ORDER — DEXTROSE 5 % IV SOLN
1.5000 g | INTRAVENOUS | Status: AC
  Administered 2015-08-14: 1.5 g via INTRAVENOUS

## 2015-08-14 MED ORDER — CLONIDINE HCL 0.1 MG PO TABS: 0.2000 mg | ORAL_TABLET | ORAL | Status: DC | PRN

## 2015-08-14 MED ORDER — MIDAZOLAM HCL 5 MG/5ML IJ SOLN
INTRAMUSCULAR | Status: AC
  Filled 2015-08-14: qty 5

## 2015-08-14 MED ORDER — IOHEXOL 300 MG/ML  SOLN
INTRAMUSCULAR | Status: DC | PRN
  Administered 2015-08-14: 75 mL via INTRA_ARTERIAL

## 2015-08-14 MED ORDER — DIPHENHYDRAMINE HCL 50 MG/ML IJ SOLN
INTRAMUSCULAR | Status: AC
  Filled 2015-08-14: qty 1

## 2015-08-14 MED ORDER — HEPARIN SODIUM (PORCINE) 1000 UNIT/ML IJ SOLN
INTRAMUSCULAR | Status: DC | PRN
  Administered 2015-08-14: 4000 [IU] via INTRAVENOUS

## 2015-08-14 MED ORDER — MIDAZOLAM HCL 2 MG/2ML IJ SOLN
INTRAMUSCULAR | Status: DC | PRN
  Administered 2015-08-14 (×2): 1 mg via INTRAVENOUS
  Administered 2015-08-14: 2 mg via INTRAVENOUS

## 2015-08-14 MED ORDER — ONDANSETRON HCL 4 MG/2ML IJ SOLN: 4.0000 mg | Freq: Four times a day (QID) | INTRAMUSCULAR | Status: DC | PRN

## 2015-08-14 MED ORDER — HEPARIN SODIUM (PORCINE) 1000 UNIT/ML IJ SOLN
INTRAMUSCULAR | Status: AC
  Filled 2015-08-14: qty 1

## 2015-08-14 MED ORDER — HYDROMORPHONE HCL 1 MG/ML IJ SOLN: 0.5000 mg | INTRAMUSCULAR | Status: DC | PRN

## 2015-08-14 MED ORDER — INSULIN ASPART 100 UNIT/ML ~~LOC~~ SOLN: 0.0000 [IU] | SUBCUTANEOUS | Status: DC

## 2015-08-14 MED ORDER — HYDRALAZINE HCL 20 MG/ML IJ SOLN
20.0000 mg | Freq: Once | INTRAMUSCULAR | Status: AC
Start: 2015-08-14 — End: 2015-08-14
  Administered 2015-08-14: 20 mg via INTRAVENOUS

## 2015-08-14 MED ORDER — ALUM & MAG HYDROXIDE-SIMETH 200-200-20 MG/5ML PO SUSP: 15.0000 mL | ORAL | Status: DC | PRN

## 2015-08-14 MED ORDER — ACETAMINOPHEN 325 MG RE SUPP: 325.0000 mg | RECTAL | Status: DC | PRN

## 2015-08-14 MED ORDER — CLOPIDOGREL BISULFATE 75 MG PO TABS: 75.0000 mg | ORAL_TABLET | Freq: Every day | ORAL | Status: DC

## 2015-08-14 MED ORDER — DIPHENHYDRAMINE HCL 50 MG/ML IJ SOLN
INTRAMUSCULAR | Status: DC | PRN
  Administered 2015-08-14: 50 mg via INTRAVENOUS

## 2015-08-14 MED ORDER — FENTANYL CITRATE (PF) 100 MCG/2ML IJ SOLN
INTRAMUSCULAR | Status: AC
  Filled 2015-08-14: qty 2

## 2015-08-14 MED ORDER — HYDRALAZINE HCL 20 MG/ML IJ SOLN
INTRAMUSCULAR | Status: AC
  Filled 2015-08-14: qty 1

## 2015-08-14 MED ORDER — LIDOCAINE HCL 1 % IJ SOLN
INTRAMUSCULAR | Status: DC | PRN
  Administered 2015-08-14: 5 mL via INTRADERMAL

## 2015-08-14 MED ORDER — CLOPIDOGREL BISULFATE 75 MG PO TABS
300.0000 mg | ORAL_TABLET | ORAL | Status: AC
  Administered 2015-08-14: 300 mg via ORAL

## 2015-08-14 MED ORDER — LIDOCAINE HCL (PF) 1 % IJ SOLN
INTRAMUSCULAR | Status: AC
  Filled 2015-08-14: qty 10

## 2015-08-14 MED ORDER — OXYCODONE HCL 5 MG PO TABS: ORAL_TABLET | ORAL | Status: DC

## 2015-08-14 MED ORDER — FENTANYL CITRATE (PF) 100 MCG/2ML IJ SOLN
INTRAMUSCULAR | Status: DC | PRN
  Administered 2015-08-14 (×3): 50 ug via INTRAVENOUS

## 2015-08-14 MED ORDER — ACETAMINOPHEN 325 MG PO TABS: 325.0000 mg | ORAL_TABLET | ORAL | Status: DC | PRN

## 2015-08-14 MED ORDER — OXYCODONE HCL 5 MG PO TABS: 5.0000 mg | ORAL_TABLET | ORAL | Status: DC | PRN

## 2015-08-14 MED ORDER — HEPARIN (PORCINE) IN NACL 2-0.9 UNIT/ML-% IJ SOLN
INTRAMUSCULAR | Status: AC
  Filled 2015-08-14: qty 1000

## 2015-08-14 MED ORDER — SODIUM CHLORIDE 0.9 % IV SOLN
INTRAVENOUS | Status: DC
  Administered 2015-08-14: 08:00:00 via INTRAVENOUS

## 2015-08-14 SURGICAL SUPPLY — 26 items
BALLN ULTRVRSE 2.5X220X150 (BALLOONS) ×5
BALLN ULTRVRSE 2X150X150 (BALLOONS) ×2
BALLN ULTRVRSE 2X150X150 OTW (BALLOONS) ×3
BALLN ULTRVRSE RX 1.5X40X200 (BALLOONS) ×5
BALLOON ULTRVRSE 2.5X220X150 (BALLOONS) ×3 IMPLANT
BALLOON ULTRVRSE 2X150X150 OTW (BALLOONS) ×3 IMPLANT
BALLOON ULTRVRSE RX 1.5X40X200 (BALLOONS) ×3 IMPLANT
CATH CXI SUPP ST 2.6FR 150CM (MICROCATHETER) ×5 IMPLANT
CATH PIG 70CM (CATHETERS) ×5 IMPLANT
CATH RIM 65CM (CATHETERS) ×5 IMPLANT
CATH STS 5FR 125CM (CATHETERS) ×5 IMPLANT
DEVICE PRESTO INFLATION (MISCELLANEOUS) ×5 IMPLANT
DEVICE STARCLOSE SE CLOSURE (Vascular Products) ×5 IMPLANT
DEVICE TORQUE (MISCELLANEOUS) ×5 IMPLANT
GLIDECATH F4/38/100ST (CATHETERS) ×5 IMPLANT
GUIDEWIRE ANGLED .035X260CM (WIRE) ×5 IMPLANT
GUIDEWIRE SUPER STIFF .035X180 (WIRE) ×5 IMPLANT
PACK ANGIOGRAPHY (CUSTOM PROCEDURE TRAY) ×5 IMPLANT
SET INTRO CAPELLA COAXIAL (SET/KITS/TRAYS/PACK) ×5 IMPLANT
SHEATH BRITE TIP 5FRX11 (SHEATH) ×5 IMPLANT
SHEATH HIGHFLEX ANSEL 6FRX55 (SHEATH) ×5 IMPLANT
SYR MEDRAD MARK V 150ML (SYRINGE) ×5 IMPLANT
TUBING CONTRAST HIGH PRESS 72 (TUBING) ×5 IMPLANT
WIRE G V18X300CM (WIRE) ×5 IMPLANT
WIRE J 3MM .035X145CM (WIRE) ×5 IMPLANT
WIRE SPARTACORE .014X300CM (WIRE) ×5 IMPLANT

## 2015-08-14 NOTE — Progress Notes (Signed)
rs locked up and daughter notified rx is ready

## 2015-08-14 NOTE — H&P (Signed)
 [facility] History & Physical Update  The patient was interviewed and re-examined.  [name] previous History and Physical has been reviewed and is unchanged.  There is no change in the plan of care. We plan to proceed with the scheduled procedure.  Schnier, [name], MD  08/14/2015, 8:20 AM

## 2015-08-14 NOTE — Op Note (Signed)
[name]   Date of Surgery: 08/14/2015  Surgeon:  Katha Cabal, MD.  Pre-operative Diagnosis: Atherosclerotic occlusive disease bilateral lower extremities with rest pain of the left foot  Post-operative diagnosis: Same  Procedure(s) Performed: 1. Introduction catheter into left lower extremity 3rd order catheter placement left anterior tibial  2. Contrast injection left lower extremity for distal runoff   3.  Additional third order catheter placement left posterior tibialartery             4.  Percutaneous transluminal angioplasty left posterior tibial to 2.5 mm maximal diameter             5.   Star close closure right common femoral arteriotomy  Anesthesia: Conscious sedation with IV Versed and fentanyl  Sheath: 6 [place] Ansell high flex  Contrast: 75 cc  Fluoroscopy Time: 17.2 minutes  Indications: [name] presents with ischemic pain of the left heel is occurring continuously including rest. Noninvasive studies demonstrate severe dampening of the digital signals and poor waveforms of the anterior tibial and posterior tibial at the level of the ankle. The risks and benefits are reviewed all questions answered patient agrees to proceed.  Procedure: [name] is a 79 y.o. y.o. female who was identified and appropriate procedural time out was performed. The patient was then placed supine on the table and prepped and draped in the usual sterile fashion.   Ultrasound was placed in the sterile sleeve and the right groin was evaluated the right common femoral artery was echolucent and pulsatile indicating patency.  Image was recorded for the permanent record and under real-time visualization a microneedle was inserted into the common femoral artery microwire followed by a micro-sheath.  A J-wire was then advanced through the micro-sheath and  a  5 [place] sheath was then inserted over a J-wire. J-wire was then advanced and a 5 French pigtail catheter was positioned at the level of T12. AP projection of the aorta was then obtained. Pigtail catheter was repositioned to above the bifurcation and a RAO view of the pelvis was obtained.  Subsequently a rim catheter with the floppy angle Glidewire was used to cross the aortic bifurcation the catheter wire were advanced down into the left distal superficial femoral artery. Amplatz Super Stiff wire was then advanced under fluoroscopic guidance and subsequently the Ansell sheath was advanced up and over the bifurcation and positioned in the distal external iliac. With the Amplatz wire still in place hand injection contrast in the oblique view of the femoral bifurcation was then obtained. A straight catheter was then advanced over the Amplatz wire and subsequently the wire was reintroduced and the straight catheter negotiated into the SFA and then the anterior tibial and hand injections of contrast were performed this represents the initial third order catheter placement. Distal runoff was then performed. No distal target is seen abrupt occlusion at the level of the fibular plate is noted dorsalis pedis does not reconstitute and therefore the catheter was pulled back into the distal popliteal and the wire and catheter negotiated into the posterior tibial. Hand injection contrast at this level again demonstrates occlusion with several greater than 80% stenoses along the way the occlusion is at the level of the ankle but there does appear to be some reconstitution of the plantar branches although they are at least they appear heavily diseased.  5000 units of heparin was then given and allowed to circulate and a Ansell sheath was  already advanced up and over the bifurcation and positioned in the femoral artery  Utilizing the catheter and AV V-18 0.18 wire and the wire was negotiated down to the distal one third of  the wrist or tibial artery. A 2.5 x 30 balloon was then advanced over the wire and inflation to 15 atm for minute and a half was performed. Follow-up imaging demonstrated an excellent result and were continued with a CXI catheter and the wire negotiating an area still farther a subtotal occlusion was then crossed and a 2 mm balloon was used to dilate this area subsequent the wire was negotiated down to the level of the lateral plantar and a 1.5 mm balloon was advanced over the wire unfortunately this balloon would not track all the way to the plantar branches but he did go down to the distal most aspect of the posterior tibial inflation was again to 12 atm for 1.5 minutes. Follow-up imaging demonstrated there is now filling of the lateral plantar branches and the posterior tibial appears fairly well treated throughout it's course. and the wire was negotiated down  After review of these images the sheath is pulled into the right external iliac oblique of the common femoral is obtained and a Star close device deployed. There no immediate Complications.  Findings: Initial views demonstrate the aorta and bilateral iliac system are widely patent. There is minimal atherosclerotic disease noted. On the left the common femoral profunda femoris superficial femoral-popliteal widely patent there is some calcific changes noted. The tibial vessels demonstrate diffuse disease with the posterior tibial having multiple lesions both greater than 80% subtotal occlusions as noted ultimately it is the best tibial artery filling the foot. The peroneal essentially peters out distally and has very few collaterals crossing the ankle and the anterior tibial occludes at the level of the fibular plate and there is no reconstitution or distal target observed.  Following intervention with angioplasty of multiple diameters per maximal diameter 2.5 there is recanalization of the posterior tibial.   Disposition:  Patient was taken to the recovery room in stable condition having tolerated the procedure well.  , [name] 06/05/2015,3:14 PM

## 2015-08-15 ENCOUNTER — Encounter: Payer: Self-pay | Admitting: Vascular Surgery

## 2015-08-17 ENCOUNTER — Other Ambulatory Visit: Payer: Self-pay | Admitting: Internal Medicine

## 2015-08-21 ENCOUNTER — Telehealth: Payer: Self-pay | Admitting: Internal Medicine

## 2015-08-21 ENCOUNTER — Encounter: Payer: Self-pay | Admitting: Internal Medicine

## 2015-08-21 ENCOUNTER — Ambulatory Visit (HOSPITAL_BASED_OUTPATIENT_CLINIC_OR_DEPARTMENT_OTHER): Payer: Medicare Other

## 2015-08-21 ENCOUNTER — Other Ambulatory Visit (HOSPITAL_BASED_OUTPATIENT_CLINIC_OR_DEPARTMENT_OTHER): Payer: Medicare Other

## 2015-08-21 ENCOUNTER — Ambulatory Visit (HOSPITAL_BASED_OUTPATIENT_CLINIC_OR_DEPARTMENT_OTHER): Payer: Medicare Other | Admitting: Internal Medicine

## 2015-08-21 ENCOUNTER — Other Ambulatory Visit: Payer: Medicare Other

## 2015-08-21 VITALS — BP 115/52 | HR 81 | Temp 99.3°F | Resp 17 | Ht 62.0 in | Wt 116.3 lb

## 2015-08-21 DIAGNOSIS — C9000 Multiple myeloma not having achieved remission: Secondary | ICD-10-CM

## 2015-08-21 DIAGNOSIS — Z23 Encounter for immunization: Secondary | ICD-10-CM | POA: Diagnosis not present

## 2015-08-21 DIAGNOSIS — Z7901 Long term (current) use of anticoagulants: Secondary | ICD-10-CM | POA: Diagnosis not present

## 2015-08-21 DIAGNOSIS — Z5112 Encounter for antineoplastic immunotherapy: Secondary | ICD-10-CM

## 2015-08-21 DIAGNOSIS — Z86718 Personal history of other venous thrombosis and embolism: Secondary | ICD-10-CM

## 2015-08-21 LAB — COMPREHENSIVE METABOLIC PANEL
ALBUMIN: 3.2 g/dL — AB (ref 3.5–5.0)
ALK PHOS: 60 U/L (ref 40–150)
ALT: 18 U/L (ref 0–55)
ANION GAP: 11 meq/L (ref 3–11)
AST: 18 U/L (ref 5–34)
BILIRUBIN TOTAL: 0.36 mg/dL (ref 0.20–1.20)
BUN: 17.2 mg/dL (ref 7.0–26.0)
CALCIUM: 9.4 mg/dL (ref 8.4–10.4)
CHLORIDE: 104 meq/L (ref 98–109)
CO2: 26 mEq/L (ref 22–29)
CREATININE: 0.9 mg/dL (ref 0.6–1.1)
EGFR: 74 mL/min/{1.73_m2} — ABNORMAL LOW (ref >90)
Glucose: 163 mg/dl — ABNORMAL HIGH (ref 70–140)
POTASSIUM: 3 meq/L — AB (ref 3.5–5.1)
Sodium: 141 mEq/L (ref 136–145)
Total Protein: 5.9 g/dL — ABNORMAL LOW (ref 6.4–8.3)

## 2015-08-21 LAB — CBC WITH DIFFERENTIAL/PLATELET
BASO%: 0.3 % (ref 0.0–2.0)
BASOS ABS: 0 10*3/uL (ref 0.0–0.1)
EOS ABS: 0 10*3/uL (ref 0.0–0.5)
EOS%: 0.3 % (ref 0.0–7.0)
HCT: 32.3 % — ABNORMAL LOW (ref 34.8–46.6)
HGB: 10.5 g/dL — ABNORMAL LOW (ref 11.6–15.9)
LYMPH%: 5 % — AB (ref 14.0–49.7)
MCH: 31.4 pg (ref 25.1–34.0)
MCHC: 32.4 g/dL (ref 31.5–36.0)
MCV: 96.9 fL (ref 79.5–101.0)
MONO#: 0.6 10*3/uL (ref 0.1–0.9)
MONO%: 12.6 % (ref 0.0–14.0)
NEUT#: 3.9 10*3/uL (ref 1.5–6.5)
NEUT%: 81.8 % — AB (ref 38.4–76.8)
PLATELETS: 242 10*3/uL (ref 145–400)
RBC: 3.34 10*6/uL — AB (ref 3.70–5.45)
RDW: 15.5 % — ABNORMAL HIGH (ref 11.2–14.5)
WBC: 4.7 10*3/uL (ref 3.9–10.3)
lymph#: 0.2 10*3/uL — ABNORMAL LOW (ref 0.9–3.3)

## 2015-08-21 LAB — LACTATE DEHYDROGENASE: LDH: 366 U/L — ABNORMAL HIGH (ref 125–245)

## 2015-08-21 LAB — PROTIME-INR
INR: 1.3 — ABNORMAL LOW (ref 2.00–3.50)
Protime: 15.6 Seconds — ABNORMAL HIGH (ref 10.6–13.4)

## 2015-08-21 MED ORDER — DEXAMETHASONE SODIUM PHOSPHATE 100 MG/10ML IJ SOLN
Freq: Once | INTRAMUSCULAR | Status: AC
  Administered 2015-08-21: 15:00:00 via INTRAVENOUS
  Filled 2015-08-21: qty 4

## 2015-08-21 MED ORDER — SODIUM CHLORIDE 0.9 % IV SOLN
300.0000 mg/m2 | Freq: Once | INTRAVENOUS | Status: AC
  Administered 2015-08-21: 440 mg via INTRAVENOUS
  Filled 2015-08-21: qty 22

## 2015-08-21 MED ORDER — INFLUENZA VAC SPLIT QUAD 0.5 ML IM SUSY
0.5000 mL | PREFILLED_SYRINGE | Freq: Once | INTRAMUSCULAR | Status: AC
  Administered 2015-08-21: 0.5 mL via INTRAMUSCULAR
  Filled 2015-08-21: qty 0.5

## 2015-08-21 MED ORDER — CARFILZOMIB CHEMO INJECTION 60 MG
36.0000 mg/m2 | Freq: Once | INTRAVENOUS | Status: AC
  Administered 2015-08-21: 54 mg via INTRAVENOUS
  Filled 2015-08-21: qty 27

## 2015-08-21 MED ORDER — POTASSIUM CHLORIDE CRYS ER 20 MEQ PO TBCR: 20.0000 meq | EXTENDED_RELEASE_TABLET | Freq: Once | ORAL | Status: DC

## 2015-08-21 MED ORDER — SODIUM CHLORIDE 0.9 % IV SOLN: Freq: Once | INTRAVENOUS | Status: DC

## 2015-08-21 MED ORDER — SODIUM CHLORIDE 0.9 % IV SOLN
Freq: Once | INTRAVENOUS | Status: AC
  Administered 2015-08-21: 15:00:00 via INTRAVENOUS

## 2015-08-21 NOTE — Telephone Encounter (Signed)
per pof to sch pt appt-MW sch trmt-gave tp copy of avs

## 2015-08-21 NOTE — Patient Instructions (Signed)
[facility] Discharge Instructions for Patients Receiving Chemotherapy  Today you received the following chemotherapy agents Cytoxan and Kyprolis  To help prevent nausea and vomiting after your treatment, we encourage you to take your nausea medication as directed   If you develop nausea and vomiting that is not controlled by your nausea medication, call the clinic.   BELOW ARE SYMPTOMS THAT SHOULD BE REPORTED IMMEDIATELY:  *FEVER GREATER THAN 100.5 F  *CHILLS WITH OR WITHOUT FEVER  NAUSEA AND VOMITING THAT IS NOT CONTROLLED WITH YOUR NAUSEA MEDICATION  *UNUSUAL SHORTNESS OF BREATH  *UNUSUAL BRUISING OR BLEEDING  TENDERNESS IN MOUTH AND THROAT WITH OR WITHOUT PRESENCE OF ULCERS  *URINARY PROBLEMS  *BOWEL PROBLEMS  UNUSUAL RASH Items with * indicate a potential emergency and should be followed up as soon as possible.  Feel free to call the clinic you have any questions or concerns. The clinic phone number is [phone].  Please show the CHEMO ALERT CARD at check-in to the Emergency Department and triage nurse.   

## 2015-08-21 NOTE — Progress Notes (Signed)
Reviewed labs with Dr. Julien Nordmann. Okay to proceed with treatment. Order of daily potassium supplement has been sent to patient's pharmacy per [facility], pt and daughter aware, and verbalizes understanding.

## 2015-08-21 NOTE — Progress Notes (Signed)
[place] [phone]   [phone]  OFFICE PROGRESS NOTE  [name], MD [place] [address]  DIAGNOSIS:  1. Multiple myeloma diagnosed in September 2009,  2. history of bilateral lower extremity deep vein thrombosis diagnosed in November 2009  3. acute on chronic deep vein thrombosis in the left lower extremity diagnosed in October 2010.  4.  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  PRIOR THERAPY:  1. status post palliative radiotherapy to the right femur and ischail tuberosity, first was done in December 2011 and the second treatment was completed Jan 08 2011, and the third treatment to the right distal femur completed on 04/30/2011  2. status post palliative radiotherapy to the left proximal femur completed 02/28/2011.  3. Revlimid 25 mg by mouth daily for 21 days every 4 weeks in addition to oral Decadron at 40 mg by mouth on a weekly basis status post 40 cycles.  4. weekly subcutaneous Velcade 1.3 mg/M2 status post 60 cycles, discontinued today secondary to mild disease progression. 5. Pomalyst 4 mg by mouth daily for 21 days every 4 weeks, the patient started the first dose on 12/22/2012 , in addition to Decadron 40 mg weekly. Status post 13 cycles.  6. Chemotherapy according to the [facility] [id number] expanded access treatment protocol with Elotuzumab, lenalidomide and dexamthasone. Status post 5 cycles, discontinued today secondary to disease progression.   CURRENT THERAPY:  1. Systemic chemotherapy with Carfilzomib, Cytoxan and Decadron. First cycle 09/05/2014. Status post 12 cycles. 2. Coumadin 5 mg alternating by 2.5 mg every other day by mouth.  3. Zometa 4 mg IV given every 3 months.  INTERVAL HISTORY: [name] 79 y.o. female returns to the clinic today for follow-up visit accompanied by her daughter and son. The patient is tolerating  her current treatment with Carfilzomib, Cytoxan and Decadron fairly well with no significant adverse effect fairly well.  She was recently found to have some peripheral vascular disease in the left lower extremity. The patient underwent percutaneous transluminal angioplasty of the left posterior tibial under the care of [facility]. [facility] on 08/14/2015. She was started on Plavix in addition to her current treatment with Coumadin which was resumed 6 days ago. She continues to have pain in the lower extremities. She continues to complain of generalized fatigue as well as back pain with radiation to the lower extremities. She is currently on pain medication with oxycodone 2 tablets every 6 hours as needed in addition to occasional ibuprofen. She denied having any significant weight loss or night sweats. She has no nausea or vomiting. The patient denied having any significant chest pain, shortness of breath, cough or hemoptysis. She had repeat myeloma panel performed earlier today and the patient is here to start cycle #13 of her systemic chemotherapy.  MEDICAL HISTORY: Past Medical History  Diagnosis Date  . Blood transfusion   . Depression     Mild  . History of chicken pox   . Hyperlipidemia   . Hypertension   . Degenerative joint disease   . Phlebitis   . Multiple myeloma     per Dr. Julien Nordmann, s/p palliative radiation for leg pain 2011 per Dr. Sondra Come  . Deep vein thrombosis of bilateral lower extremities (HCC)   . Family history of anesthesia complication     DAUGHTER CPR AFTER  . Cardiomyopathy ([place])   . Diabetes mellitus  Steroid related  . UTI (urinary tract infection) 09/08/2013  . History of radiation therapy 08/30/13-09/20/13    20 gray to lower lumbar/upper sacrum    ALLERGIES:  has No Known Allergies.  MEDICATIONS:  Current Outpatient Prescriptions  Medication Sig Dispense Refill  . acyclovir (ZOVIRAX) 400 MG tablet TAKE ONE TABLET BY MOUTH TWICE DAILY 60 tablet 0    . ALPRAZolam (XANAX) 0.5 MG tablet Take 1 tablet (0.5 mg total) by mouth 2 (two) times daily as needed. for anxiety 60 tablet 0  . Calcium Carbonate-Vitamin D 600-400 MG-UNIT per tablet Take 1 tablet by mouth.     . clopidogrel (PLAVIX) 75 MG tablet Take 1 tablet (75 mg total) by mouth daily. 30 tablet 11  . dexamethasone (DECADRON) 4 MG tablet TAKE 10 TABLETS BY MOUTH WEEKLY ON DAY 1 WITH CHEMOTHERAPY TREATMENT CARFILZOMIB 40 tablet 0  . glipiZIDE (GLUCOTROL XL) 2.5 MG 24 hr tablet TAKE ONE TABLET BY MOUTH ONCE DAILY 90 tablet 1  . lisinopril-hydrochlorothiazide (PRINZIDE,ZESTORETIC) 20-12.5 MG per tablet Take 1 tablet by mouth daily. 90 tablet 3  . loperamide (IMODIUM A-D) 2 MG tablet Take 1 tablet (2 mg total) by mouth 4 (four) times daily as needed for diarrhea or loose stools.    Marland Kitchen oxyCODONE (OXY IR/ROXICODONE) 5 MG immediate release tablet Take  1-2 tablets ('10mg'$  total) by mouth every 6 hours as needed for severe pain 90 tablet 0  . potassium chloride SA (K-DUR,KLOR-CON) 20 MEQ tablet Take 1 tablet (20 mEq total) by mouth once. 7 tablet 0  . prochlorperazine (COMPAZINE) 10 MG tablet Take 1 tablet (10 mg total) by mouth every 6 (six) hours as needed. 30 tablet 0  . temazepam (RESTORIL) 30 MG capsule Take 1 capsule (30 mg total) by mouth at bedtime as needed. 30 capsule 0  . warfarin (COUMADIN) 5 MG tablet TAKE ONE TABLET BY MOUTH ONCE DAILY AS DIRECTED 60 tablet 0   No current facility-administered medications for this visit.   Facility-Administered Medications Ordered in Other Visits  Medication Dose Route Frequency Provider Last Rate Last Dose  . 0.9 %  sodium chloride infusion   Intravenous Once Curt Bears, MD   Stopped at 03/21/15 1720  . 0.9 %  sodium chloride infusion   Intravenous Once Curt Bears, MD      . 0.9 %  sodium chloride infusion   Intravenous Once Curt Bears, MD   Stopped at 07/31/15 1638    SURGICAL HISTORY:  Past Surgical History  Procedure Laterality  Date  . Abdominal hysterectomy    . Ankle fracture surgery Left   . Ear cyst excision Left 04/16/2015    Procedure: EXCISION FACIAL CYST LEFT SIDE ;  Surgeon: Izora Gala, MD;  Location: [facility];  Service: ENT;  Laterality: Left;  . Peripheral vascular catheterization  08/14/2015    Procedure: Lower Extremity Intervention;  Surgeon: Katha Cabal, MD;  Location: [facility];  Service: Cardiovascular;;  . Peripheral vascular catheterization N/A 08/14/2015    Procedure: Abdominal Aortogram w/Lower Extremity;  Surgeon: Katha Cabal, MD;  Location: [facility];  Service: Cardiovascular;  Laterality: N/A;    REVIEW OF SYSTEMS:  Constitutional: positive for fatigue Eyes: negative Ears, nose, mouth, throat, and face: negative Respiratory: negative Cardiovascular: negative Gastrointestinal: negative Genitourinary:negative Integument/breast: negative Hematologic/lymphatic: negative Musculoskeletal:positive for bone pain Neurological: negative Behavioral/Psych: negative Endocrine: negative Allergic/Immunologic: negative   PHYSICAL EXAMINATION: General appearance: alert, cooperative and no distress Head: Normocephalic, without obvious abnormality,  atraumatic Neck: no adenopathy, no JVD, supple, symmetrical, trachea midline and thyroid not enlarged, symmetric, no tenderness/mass/nodules Lymph nodes: Cervical, supraclavicular, and axillary nodes normal. Resp: clear to auscultation bilaterally Back: symmetric, no curvature. ROM normal. No CVA tenderness. Cardio: regular rate and rhythm, S1, S2 normal, no murmur, click, rub or gallop GI: soft, non-tender; bowel sounds normal; no masses,  no organomegaly Extremities: extremities normal, atraumatic, no cyanosis or edema Neurologic: Alert and oriented X 3, normal strength and tone. Normal symmetric reflexes. Normal coordination and gait  ECOG PERFORMANCE STATUS: 1 - Symptomatic but completely  ambulatory  Blood pressure 115/52, pulse 81, temperature 99.3 F (37.4 C), temperature source Oral, resp. rate 17, height 5\' 2"  (1.575 m), weight 116 lb 4.8 oz (52.753 kg), SpO2 98 %.  LABORATORY DATA: Lab Results  Component Value Date   WBC 4.7 08/21/2015   HGB 10.5* 08/21/2015   HCT 32.3* 08/21/2015   MCV 96.9 08/21/2015   PLT 242 08/21/2015      Chemistry      Component Value Date/Time   NA 140 08/07/2015 1346   NA 140 04/16/2015 1039   K 4.1 08/07/2015 1346   K 4.1 04/16/2015 1039   CL 95* 04/16/2015 1039   CL 107 02/14/2013 1311   CO2 22 08/07/2015 1346   CO2 27 04/12/2015 1645   BUN 21* 08/13/2015 1548   BUN 21.1 08/07/2015 1346   CREATININE 0.75 08/13/2015 1548   CREATININE 1.1 08/07/2015 1346      Component Value Date/Time   CALCIUM 9.7 08/07/2015 1346   CALCIUM 9.1 04/12/2015 1645   CALCIUM * 06/13/2008 1605    5.7 CRITICAL RESULT CALLED TO, READ BACK BY AND VERIFIED WITH: JAMIE TRACY,RN [id number] @ 1433 BY J SCOTTON (NOTE)  Amended report. Result repeated and verified. CORRECTED ON 10/14 AT 1429: PREVIOUSLY REPORTED AS Result repeated and verified.   ALKPHOS 60 08/07/2015 1346   ALKPHOS 49 09/19/2014 1447   AST 18 08/07/2015 1346   AST 18 09/19/2014 1447   ALT 17 08/07/2015 1346   ALT 24 09/19/2014 1447   BILITOT 0.35 08/07/2015 1346   BILITOT 0.4 09/19/2014 1447      RADIOGRAPHIC STUDIES: No results found.  ASSESSMENT AND PLAN: This is a very pleasant 79 years old African-American female with history of multiple myeloma status post several chemotherapy regimens lastly including systemic chemotherapy according to the [facility] [place] 204143 clinical trial with Elotuzumab, Revlimid and dexamethasone. She status post 5 cycles and tolerating her treatment fairly well. This was discontinued secondary to disease progression. Unfortunately the recent myeloma panel showed significant increase in the free lambda light chain. I discussed the lab result with the patient and  her daughters. She completed treatment with Elotuzumab, Revlimid and dexamethasone but this was discontinued secondary to disease progression. The patient was started on treatment with systemic chemotherapy with Carfilzomib, Cytoxan and dexamethasone as salvage therapy. She is status post 12 cycles and tolerating this treatment fairly well.  Her myeloma panel is still pending today. I recommended for the patient to proceed with cycle #12 today as a scheduled.  For the low back pain, the patient will continue on oxycodone every 6 hours as needed. She would come back for follow-up visit in 2 weeks for reevaluation and discussion of the myeloma panel and further recommendation regarding her condition.. For the history of deep venous thrombosis, her PT/INR is low today at 1.3, the patient will continue on Coumadin 5 mg alternating with 2.5 mg every other day  by mouth. For the hypokalemia, the patient will start potassium chloride 20 mEq by mouth daily for 10 days. She was advised to call immediately if she has any concerning symptoms in the interval. The patient voices understanding of current disease status and treatment options and is in agreement with the current care plan.  All questions were answered. The patient knows to call the clinic with any problems, questions or concerns. We can certainly see the patient much sooner if necessary.  Disclaimer: This note was dictated with voice recognition software. Similar sounding words can inadvertently be transcribed and may not be corrected upon review.

## 2015-08-22 ENCOUNTER — Ambulatory Visit (HOSPITAL_BASED_OUTPATIENT_CLINIC_OR_DEPARTMENT_OTHER): Payer: Medicare Other

## 2015-08-22 VITALS — BP 140/62 | HR 74 | Temp 98.4°F | Resp 18

## 2015-08-22 DIAGNOSIS — Z5112 Encounter for antineoplastic immunotherapy: Secondary | ICD-10-CM

## 2015-08-22 DIAGNOSIS — C9000 Multiple myeloma not having achieved remission: Secondary | ICD-10-CM

## 2015-08-22 MED ORDER — SODIUM CHLORIDE 0.9 % IV SOLN
Freq: Once | INTRAVENOUS | Status: AC
  Administered 2015-08-22: 15:00:00 via INTRAVENOUS
  Filled 2015-08-22: qty 4

## 2015-08-22 MED ORDER — SODIUM CHLORIDE 0.9 % IV SOLN
Freq: Once | INTRAVENOUS | Status: AC
  Administered 2015-08-22: 14:00:00 via INTRAVENOUS

## 2015-08-22 MED ORDER — DEXTROSE 5 % IV SOLN
36.0000 mg/m2 | Freq: Once | INTRAVENOUS | Status: AC
  Administered 2015-08-22: 54 mg via INTRAVENOUS
  Filled 2015-08-22: qty 27

## 2015-08-22 NOTE — Patient Instructions (Signed)
[facility] Discharge Instructions for Patients Receiving Chemotherapy  Today you received the following chemotherapy agents Kyprolis  To help prevent nausea and vomiting after your treatment, we encourage you to take your nausea medication as needed   If you develop nausea and vomiting that is not controlled by your nausea medication, call the clinic.   BELOW ARE SYMPTOMS THAT SHOULD BE REPORTED IMMEDIATELY:  *FEVER GREATER THAN 100.5 F  *CHILLS WITH OR WITHOUT FEVER  NAUSEA AND VOMITING THAT IS NOT CONTROLLED WITH YOUR NAUSEA MEDICATION  *UNUSUAL SHORTNESS OF BREATH  *UNUSUAL BRUISING OR BLEEDING  TENDERNESS IN MOUTH AND THROAT WITH OR WITHOUT PRESENCE OF ULCERS  *URINARY PROBLEMS  *BOWEL PROBLEMS  UNUSUAL RASH Items with * indicate a potential emergency and should be followed up as soon as possible.  Feel free to call the clinic you have any questions or concerns. The clinic phone number is [phone].  Please show the [facility] at check-in to the Emergency Department and triage nurse.

## 2015-08-23 LAB — KAPPA/LAMBDA LIGHT CHAINS
KAPPA LAMBDA RATIO: 0.02 — AB (ref 0.26–1.65)
Kappa free light chain: 0.15 mg/dL — ABNORMAL LOW (ref 0.33–1.94)
Lambda Free Lght Chn: 6.18 mg/dL — ABNORMAL HIGH (ref 0.57–2.63)

## 2015-08-23 LAB — BETA 2 MICROGLOBULIN, SERUM: Beta-2 Microglobulin: 2.6 mg/L — ABNORMAL HIGH (ref <=2.51)

## 2015-08-23 LAB — IGG, IGA, IGM
IGA: 10 mg/dL — AB (ref 69–380)
IGM, SERUM: 15 mg/dL — AB (ref 52–322)
IgG (Immunoglobin G), Serum: 184 mg/dL — ABNORMAL LOW (ref 690–1700)

## 2015-08-24 ENCOUNTER — Other Ambulatory Visit: Payer: Self-pay | Admitting: Medical Oncology

## 2015-08-24 ENCOUNTER — Telehealth: Payer: Self-pay | Admitting: Internal Medicine

## 2015-08-24 NOTE — Telephone Encounter (Signed)
Called and left a message with appointments

## 2015-08-28 ENCOUNTER — Other Ambulatory Visit (HOSPITAL_BASED_OUTPATIENT_CLINIC_OR_DEPARTMENT_OTHER): Payer: Medicare Other

## 2015-08-28 ENCOUNTER — Ambulatory Visit (HOSPITAL_BASED_OUTPATIENT_CLINIC_OR_DEPARTMENT_OTHER): Payer: Medicare Other

## 2015-08-28 ENCOUNTER — Telehealth: Payer: Self-pay | Admitting: *Deleted

## 2015-08-28 VITALS — BP 136/61 | HR 82 | Temp 98.0°F | Resp 18

## 2015-08-28 DIAGNOSIS — Z5111 Encounter for antineoplastic chemotherapy: Secondary | ICD-10-CM | POA: Diagnosis not present

## 2015-08-28 DIAGNOSIS — I82402 Acute embolism and thrombosis of unspecified deep veins of left lower extremity: Secondary | ICD-10-CM | POA: Diagnosis not present

## 2015-08-28 DIAGNOSIS — C9000 Multiple myeloma not having achieved remission: Secondary | ICD-10-CM

## 2015-08-28 DIAGNOSIS — Z5112 Encounter for antineoplastic immunotherapy: Secondary | ICD-10-CM | POA: Diagnosis not present

## 2015-08-28 LAB — PROTIME-INR
INR: 1.1 — ABNORMAL LOW (ref 2.00–3.50)
Protime: 13.2 Seconds (ref 10.6–13.4)

## 2015-08-28 LAB — CBC WITH DIFFERENTIAL/PLATELET
BASO%: 0 % (ref 0.0–2.0)
Basophils Absolute: 0 10*3/uL (ref 0.0–0.1)
EOS ABS: 0 10*3/uL (ref 0.0–0.5)
EOS%: 0.1 % (ref 0.0–7.0)
HEMATOCRIT: 33.4 % — AB (ref 34.8–46.6)
HEMOGLOBIN: 10.6 g/dL — AB (ref 11.6–15.9)
LYMPH#: 0.2 10*3/uL — AB (ref 0.9–3.3)
LYMPH%: 3.5 % — AB (ref 14.0–49.7)
MCH: 31.2 pg (ref 25.1–34.0)
MCHC: 31.8 g/dL (ref 31.5–36.0)
MCV: 98.3 fL (ref 79.5–101.0)
MONO#: 0.1 10*3/uL (ref 0.1–0.9)
MONO%: 1 % (ref 0.0–14.0)
NEUT%: 95.4 % — ABNORMAL HIGH (ref 38.4–76.8)
NEUTROS ABS: 6.7 10*3/uL — AB (ref 1.5–6.5)
PLATELETS: 130 10*3/uL — AB (ref 145–400)
RBC: 3.4 10*6/uL — ABNORMAL LOW (ref 3.70–5.45)
RDW: 15.8 % — AB (ref 11.2–14.5)
WBC: 7 10*3/uL (ref 3.9–10.3)

## 2015-08-28 LAB — COMPREHENSIVE METABOLIC PANEL
ALBUMIN: 3.2 g/dL — AB (ref 3.5–5.0)
ALK PHOS: 62 U/L (ref 40–150)
ALT: 17 U/L (ref 0–55)
ANION GAP: 11 meq/L (ref 3–11)
AST: 16 U/L (ref 5–34)
BILIRUBIN TOTAL: 0.32 mg/dL (ref 0.20–1.20)
BUN: 17.7 mg/dL (ref 7.0–26.0)
CALCIUM: 9.9 mg/dL (ref 8.4–10.4)
CO2: 20 mEq/L — ABNORMAL LOW (ref 22–29)
CREATININE: 1 mg/dL (ref 0.6–1.1)
Chloride: 107 mEq/L (ref 98–109)
EGFR: 61 mL/min/{1.73_m2} — AB (ref >90)
Glucose: 284 mg/dl — ABNORMAL HIGH (ref 70–140)
Potassium: 4.6 mEq/L (ref 3.5–5.1)
Sodium: 139 mEq/L (ref 136–145)
TOTAL PROTEIN: 5.8 g/dL — AB (ref 6.4–8.3)

## 2015-08-28 MED ORDER — SODIUM CHLORIDE 0.9 % IV SOLN
Freq: Once | INTRAVENOUS | Status: AC
  Administered 2015-08-28: 15:00:00 via INTRAVENOUS
  Filled 2015-08-28: qty 4

## 2015-08-28 MED ORDER — DEXTROSE 5 % IV SOLN
36.0000 mg/m2 | Freq: Once | INTRAVENOUS | Status: AC
  Administered 2015-08-28: 54 mg via INTRAVENOUS
  Filled 2015-08-28: qty 27

## 2015-08-28 MED ORDER — SODIUM CHLORIDE 0.9 % IV SOLN
300.0000 mg/m2 | Freq: Once | INTRAVENOUS | Status: AC
  Administered 2015-08-28: 440 mg via INTRAVENOUS
  Filled 2015-08-28: qty 22

## 2015-08-28 MED ORDER — OXYCODONE HCL 5 MG PO TABS: ORAL_TABLET | ORAL | Status: DC

## 2015-08-28 MED ORDER — SODIUM CHLORIDE 0.9 % IV SOLN: Freq: Once | INTRAVENOUS | Status: DC

## 2015-08-28 MED ORDER — SODIUM CHLORIDE 0.9 % IV SOLN
Freq: Once | INTRAVENOUS | Status: AC
  Administered 2015-08-28: 15:00:00 via INTRAVENOUS

## 2015-08-28 NOTE — Patient Instructions (Signed)
[facility] Discharge Instructions for Patients Receiving Chemotherapy  Today you received the following chemotherapy agents Cytoxan and Kyprolis  To help prevent nausea and vomiting after your treatment, we encourage you to take your nausea medication as directed   If you develop nausea and vomiting that is not controlled by your nausea medication, call the clinic.   BELOW ARE SYMPTOMS THAT SHOULD BE REPORTED IMMEDIATELY:  *FEVER GREATER THAN 100.5 F  *CHILLS WITH OR WITHOUT FEVER  NAUSEA AND VOMITING THAT IS NOT CONTROLLED WITH YOUR NAUSEA MEDICATION  *UNUSUAL SHORTNESS OF BREATH  *UNUSUAL BRUISING OR BLEEDING  TENDERNESS IN MOUTH AND THROAT WITH OR WITHOUT PRESENCE OF ULCERS  *URINARY PROBLEMS  *BOWEL PROBLEMS  UNUSUAL RASH Items with * indicate a potential emergency and should be followed up as soon as possible.  Feel free to call the clinic you have any questions or concerns. The clinic phone number is [phone].  Please show the CHEMO ALERT CARD at check-in to the Emergency Department and triage nurse.   

## 2015-08-28 NOTE — Telephone Encounter (Signed)
Call from pt daughter, request for Oxycodone '5mg'$  1-2 tablets q 6 hrs refilled. Reviewed with MD, Notified pt ready for pick up  Labs reviewed with MD, pt to take Coumadin '5mg'$  daily except Monday. Follow up in 2 weeks for lab only to recheck PT INR POF to scheduling  Rx walked over to pt while in infusion room

## 2015-08-29 ENCOUNTER — Telehealth: Payer: Self-pay | Admitting: Internal Medicine

## 2015-08-29 ENCOUNTER — Ambulatory Visit (HOSPITAL_BASED_OUTPATIENT_CLINIC_OR_DEPARTMENT_OTHER): Payer: Medicare Other

## 2015-08-29 VITALS — BP 173/56 | HR 70 | Temp 98.9°F | Resp 17

## 2015-08-29 DIAGNOSIS — Z5112 Encounter for antineoplastic immunotherapy: Secondary | ICD-10-CM

## 2015-08-29 DIAGNOSIS — C9000 Multiple myeloma not having achieved remission: Secondary | ICD-10-CM | POA: Diagnosis not present

## 2015-08-29 MED ORDER — SODIUM CHLORIDE 0.9 % IV SOLN
Freq: Once | INTRAVENOUS | Status: AC
  Administered 2015-08-29: 14:00:00 via INTRAVENOUS

## 2015-08-29 MED ORDER — DEXTROSE 5 % IV SOLN
36.0000 mg/m2 | Freq: Once | INTRAVENOUS | Status: AC
  Administered 2015-08-29: 54 mg via INTRAVENOUS
  Filled 2015-08-29: qty 27

## 2015-08-29 MED ORDER — SODIUM CHLORIDE 0.9 % IV SOLN: Freq: Once | INTRAVENOUS | Status: DC

## 2015-08-29 MED ORDER — DEXAMETHASONE SODIUM PHOSPHATE 100 MG/10ML IJ SOLN
Freq: Once | INTRAMUSCULAR | Status: AC
  Administered 2015-08-29: 15:00:00 via INTRAVENOUS
  Filled 2015-08-29: qty 4

## 2015-08-29 NOTE — Telephone Encounter (Signed)
1/11 lab added per pof and patient will get a new schedule/avs at 09/04/15

## 2015-08-29 NOTE — Patient Instructions (Signed)
[facility] Discharge Instructions for Patients Receiving Chemotherapy  Today you received the following chemotherapy agents: Kyprolis   To help prevent nausea and vomiting after your treatment, we encourage you to take your nausea medication as directed.    If you develop nausea and vomiting that is not controlled by your nausea medication, call the clinic.   BELOW ARE SYMPTOMS THAT SHOULD BE REPORTED IMMEDIATELY:  *FEVER GREATER THAN 100.5 F  *CHILLS WITH OR WITHOUT FEVER  NAUSEA AND VOMITING THAT IS NOT CONTROLLED WITH YOUR NAUSEA MEDICATION  *UNUSUAL SHORTNESS OF BREATH  *UNUSUAL BRUISING OR BLEEDING  TENDERNESS IN MOUTH AND THROAT WITH OR WITHOUT PRESENCE OF ULCERS  *URINARY PROBLEMS  *BOWEL PROBLEMS  UNUSUAL RASH Items with * indicate a potential emergency and should be followed up as soon as possible.  Feel free to call the clinic you have any questions or concerns. The clinic phone number is [phone].  Please show the CHEMO ALERT CARD at check-in to the Emergency Department and triage nurse.   

## 2015-08-30 DIAGNOSIS — I70213 Atherosclerosis of native arteries of extremities with intermittent claudication, bilateral legs: Secondary | ICD-10-CM | POA: Diagnosis not present

## 2015-08-30 DIAGNOSIS — M79609 Pain in unspecified limb: Secondary | ICD-10-CM | POA: Diagnosis not present

## 2015-08-31 ENCOUNTER — Other Ambulatory Visit: Payer: Self-pay | Admitting: Internal Medicine

## 2015-09-03 ENCOUNTER — Other Ambulatory Visit: Payer: Self-pay | Admitting: Internal Medicine

## 2015-09-03 DIAGNOSIS — F411 Generalized anxiety disorder: Secondary | ICD-10-CM

## 2015-09-03 DIAGNOSIS — C9000 Multiple myeloma not having achieved remission: Secondary | ICD-10-CM

## 2015-09-04 ENCOUNTER — Other Ambulatory Visit: Payer: Self-pay | Admitting: Physician Assistant

## 2015-09-04 ENCOUNTER — Other Ambulatory Visit (HOSPITAL_BASED_OUTPATIENT_CLINIC_OR_DEPARTMENT_OTHER): Payer: Medicare Other

## 2015-09-04 ENCOUNTER — Other Ambulatory Visit: Payer: Medicare Other | Admitting: Physician Assistant

## 2015-09-04 ENCOUNTER — Ambulatory Visit (HOSPITAL_BASED_OUTPATIENT_CLINIC_OR_DEPARTMENT_OTHER): Payer: Medicare Other | Admitting: Physician Assistant

## 2015-09-04 ENCOUNTER — Ambulatory Visit (HOSPITAL_BASED_OUTPATIENT_CLINIC_OR_DEPARTMENT_OTHER): Payer: Medicare Other

## 2015-09-04 VITALS — BP 134/85 | HR 77 | Temp 98.1°F | Resp 18 | Ht 62.0 in | Wt 114.6 lb

## 2015-09-04 DIAGNOSIS — E876 Hypokalemia: Secondary | ICD-10-CM

## 2015-09-04 DIAGNOSIS — Z5112 Encounter for antineoplastic immunotherapy: Secondary | ICD-10-CM

## 2015-09-04 DIAGNOSIS — R234 Changes in skin texture: Secondary | ICD-10-CM

## 2015-09-04 DIAGNOSIS — I82402 Acute embolism and thrombosis of unspecified deep veins of left lower extremity: Secondary | ICD-10-CM

## 2015-09-04 DIAGNOSIS — Z66 Do not resuscitate: Secondary | ICD-10-CM

## 2015-09-04 DIAGNOSIS — Z7901 Long term (current) use of anticoagulants: Secondary | ICD-10-CM

## 2015-09-04 DIAGNOSIS — C9000 Multiple myeloma not having achieved remission: Secondary | ICD-10-CM

## 2015-09-04 DIAGNOSIS — M25552 Pain in left hip: Secondary | ICD-10-CM

## 2015-09-04 DIAGNOSIS — Z5111 Encounter for antineoplastic chemotherapy: Secondary | ICD-10-CM

## 2015-09-04 DIAGNOSIS — E119 Type 2 diabetes mellitus without complications: Secondary | ICD-10-CM

## 2015-09-04 DIAGNOSIS — F411 Generalized anxiety disorder: Secondary | ICD-10-CM

## 2015-09-04 DIAGNOSIS — G8929 Other chronic pain: Secondary | ICD-10-CM

## 2015-09-04 DIAGNOSIS — R001 Bradycardia, unspecified: Secondary | ICD-10-CM

## 2015-09-04 DIAGNOSIS — R2681 Unsteadiness on feet: Secondary | ICD-10-CM

## 2015-09-04 DIAGNOSIS — I10 Essential (primary) hypertension: Secondary | ICD-10-CM

## 2015-09-04 LAB — PROTIME-INR
INR: 1.4 — ABNORMAL LOW (ref 2.00–3.50)
PROTIME: 16.8 s — AB (ref 10.6–13.4)

## 2015-09-04 LAB — CBC WITH DIFFERENTIAL/PLATELET
BASO%: 0.1 % (ref 0.0–2.0)
BASOS ABS: 0 10*3/uL (ref 0.0–0.1)
EOS ABS: 0 10*3/uL (ref 0.0–0.5)
EOS%: 0 % (ref 0.0–7.0)
HEMATOCRIT: 34.6 % — AB (ref 34.8–46.6)
HEMOGLOBIN: 11.1 g/dL — AB (ref 11.6–15.9)
LYMPH#: 0.2 10*3/uL — AB (ref 0.9–3.3)
LYMPH%: 3 % — ABNORMAL LOW (ref 14.0–49.7)
MCH: 31.4 pg (ref 25.1–34.0)
MCHC: 32.1 g/dL (ref 31.5–36.0)
MCV: 97.6 fL (ref 79.5–101.0)
MONO#: 0.1 10*3/uL (ref 0.1–0.9)
MONO%: 1.4 % (ref 0.0–14.0)
NEUT%: 95.5 % — ABNORMAL HIGH (ref 38.4–76.8)
NEUTROS ABS: 7.3 10*3/uL — AB (ref 1.5–6.5)
PLATELETS: 165 10*3/uL (ref 145–400)
RBC: 3.54 10*6/uL — ABNORMAL LOW (ref 3.70–5.45)
RDW: 15.9 % — AB (ref 11.2–14.5)
WBC: 7.6 10*3/uL (ref 3.9–10.3)

## 2015-09-04 LAB — COMPREHENSIVE METABOLIC PANEL
ALBUMIN: 3.4 g/dL — AB (ref 3.5–5.0)
ALK PHOS: 69 U/L (ref 40–150)
ALT: 14 U/L (ref 0–55)
ANION GAP: 10 meq/L (ref 3–11)
AST: 16 U/L (ref 5–34)
BILIRUBIN TOTAL: 0.35 mg/dL (ref 0.20–1.20)
BUN: 13.7 mg/dL (ref 7.0–26.0)
CALCIUM: 9.9 mg/dL (ref 8.4–10.4)
CO2: 22 mEq/L (ref 22–29)
Chloride: 108 mEq/L (ref 98–109)
Creatinine: 0.9 mg/dL (ref 0.6–1.1)
EGFR: 77 mL/min/{1.73_m2} — AB (ref >90)
GLUCOSE: 187 mg/dL — AB (ref 70–140)
Potassium: 4.2 mEq/L (ref 3.5–5.1)
SODIUM: 140 meq/L (ref 136–145)
TOTAL PROTEIN: 6.4 g/dL (ref 6.4–8.3)

## 2015-09-04 MED ORDER — ZOLEDRONIC ACID 4 MG/100ML IV SOLN
4.0000 mg | Freq: Once | INTRAVENOUS | Status: AC
  Administered 2015-09-04: 4 mg via INTRAVENOUS
  Filled 2015-09-04: qty 100

## 2015-09-04 MED ORDER — DEXTROSE 5 % IV SOLN
36.0000 mg/m2 | Freq: Once | INTRAVENOUS | Status: AC
  Administered 2015-09-04: 54 mg via INTRAVENOUS
  Filled 2015-09-04: qty 27

## 2015-09-04 MED ORDER — SODIUM CHLORIDE 0.9 % IV SOLN
Freq: Once | INTRAVENOUS | Status: AC
  Administered 2015-09-04: 15:00:00 via INTRAVENOUS

## 2015-09-04 MED ORDER — DEXAMETHASONE SODIUM PHOSPHATE 100 MG/10ML IJ SOLN
Freq: Once | INTRAMUSCULAR | Status: AC
  Administered 2015-09-04: 15:00:00 via INTRAVENOUS
  Filled 2015-09-04: qty 4

## 2015-09-04 MED ORDER — CYCLOPHOSPHAMIDE CHEMO INJECTION 1 GM
300.0000 mg/m2 | Freq: Once | INTRAMUSCULAR | Status: AC
  Administered 2015-09-04: 440 mg via INTRAVENOUS
  Filled 2015-09-04: qty 22

## 2015-09-04 NOTE — Telephone Encounter (Signed)
Called in xanax refill

## 2015-09-04 NOTE — Patient Instructions (Signed)
[facility] Discharge Instructions for Patients Receiving Chemotherapy  Today you received the following chemotherapy agents Cytoxan/Kyprolis.  To help prevent nausea and vomiting after your treatment, we encourage you to take your nausea medication as directed.    If you develop nausea and vomiting that is not controlled by your nausea medication, call the clinic.   BELOW ARE SYMPTOMS THAT SHOULD BE REPORTED IMMEDIATELY:  *FEVER GREATER THAN 100.5 F  *CHILLS WITH OR WITHOUT FEVER  NAUSEA AND VOMITING THAT IS NOT CONTROLLED WITH YOUR NAUSEA MEDICATION  *UNUSUAL SHORTNESS OF BREATH  *UNUSUAL BRUISING OR BLEEDING  TENDERNESS IN MOUTH AND THROAT WITH OR WITHOUT PRESENCE OF ULCERS  *URINARY PROBLEMS  *BOWEL PROBLEMS  UNUSUAL RASH Items with * indicate a potential emergency and should be followed up as soon as possible.  Feel free to call the clinic you have any questions or concerns. The clinic phone number is [phone].  Please show the [facility] at check-in to the Emergency Department and triage nurse.

## 2015-09-04 NOTE — Progress Notes (Signed)
[name] [phone]   [phone]  OFFICE PROGRESS NOTE  Lindsay Stain, MD [place] [address]  DIAGNOSIS:  1. Multiple myeloma diagnosed in September 2009,  2. history of bilateral lower extremity deep vein thrombosis diagnosed in November 2009  3. acute on chronic deep vein thrombosis in the left lower extremity diagnosed in October 2010.  4.  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  PRIOR THERAPY:  1. status post palliative radiotherapy to the right femur and ischail tuberosity, first was done in December 2011 and the second treatment was completed Jan 08 2011, and the third treatment to the right distal femur completed on 04/30/2011  2. status post palliative radiotherapy to the left proximal femur completed 02/28/2011.  3. Revlimid 25 mg by mouth daily for 21 days every 4 weeks in addition to oral Decadron at 40 mg by mouth on a weekly basis status post 40 cycles.  4. weekly subcutaneous Velcade 1.3 mg/M2 status post 60 cycles, discontinued today secondary to mild disease progression. 5. Pomalyst 4 mg by mouth daily for 21 days every 4 weeks, the patient started the first dose on 12/22/2012 , in addition to Decadron 40 mg weekly. Status post 13 cycles.  6. Chemotherapy according to the [facility] [id number] expanded access treatment protocol with Elotuzumab, lenalidomide and dexamthasone. Status post 5 cycles, discontinued today secondary to disease progression.   CURRENT THERAPY:  1. Systemic chemotherapy with Carfilzomib, Cytoxan and Decadron. First cycle 09/05/2014. Status post 12 cycles. 2. Coumadin 5 mg alternating by 2.5 mg every other day by mouth.  3. Zometa 4 mg IV given every 3 months.  INTERVAL HISTORY: [name] 80 y.o. female returns to the clinic today for follow-up visit accompanied by her daughter and son. The patient is tolerating  her current treatment with Carfilzomib, Cytoxan and Decadron fairly well with no significant adverse effect fairly well. She continues to complain of generalized fatigue as well as back pain with radiation to the lower extremities, especially on her left heel, related to poor circulation. She had an angiogram recently and was seen by vascular surgeon, who recommended to treat medically. She is to have a second opinion for same issue. She is currently on pain medication with oxycodone 2 tablets every 6 hours as needed in addition to occasional ibuprofen. She denied having any significant weight loss or night sweats. She has no nausea or vomiting. The patient denied having any significant chest pain, shortness of breath, cough or hemoptysis. She is here to start cycle #13 of her systemic chemotherapy.  MEDICAL HISTORY: Past Medical History  Diagnosis Date  . Blood transfusion   . Depression     Mild  . History of chicken pox   . Hyperlipidemia   . Hypertension   . Degenerative joint disease   . Phlebitis   . Multiple myeloma     per Dr. Julien Nordmann, s/p palliative radiation for leg pain 2011 per Dr. Sondra Come  . Deep vein thrombosis of bilateral lower extremities (HCC)   . Family history of anesthesia complication     DAUGHTER CPR AFTER  . Cardiomyopathy ([place])   . Diabetes mellitus     Steroid related  . UTI (urinary tract infection) 09/08/2013  . History of radiation therapy 08/30/13-09/20/13    20 gray to lower lumbar/upper sacrum    ALLERGIES:  has No Known Allergies.  MEDICATIONS:  Current  Outpatient Prescriptions  Medication Sig Dispense Refill  . acyclovir (ZOVIRAX) 400 MG tablet TAKE ONE TABLET BY MOUTH TWICE DAILY 60 tablet 0  . ALPRAZolam (XANAX) 0.5 MG tablet TAKE ONE TABLET BY MOUTH TWICE DAILY AS NEEDED FOR ANXIETY 60 tablet 0  . Calcium Carbonate-Vitamin D 600-400 MG-UNIT per tablet Take 1 tablet by mouth.     . clopidogrel (PLAVIX) 75 MG tablet Take 1 tablet (75 mg total) by  mouth daily. 30 tablet 11  . dexamethasone (DECADRON) 4 MG tablet TAKE 10 TABS BY MOUTH WEEKLY ON DAY 1 WITH CHEMOTHERAPY TREATMENT CARFILZOMIB 40 tablet 0  . glipiZIDE (GLUCOTROL XL) 2.5 MG 24 hr tablet TAKE ONE TABLET BY MOUTH ONCE DAILY 90 tablet 1  . lisinopril-hydrochlorothiazide (PRINZIDE,ZESTORETIC) 20-12.5 MG per tablet Take 1 tablet by mouth daily. 90 tablet 3  . loperamide (IMODIUM A-D) 2 MG tablet Take 1 tablet (2 mg total) by mouth 4 (four) times daily as needed for diarrhea or loose stools.    Marland Kitchen oxyCODONE (OXY IR/ROXICODONE) 5 MG immediate release tablet Take  1-2 tablets ('10mg'$  total) by mouth every 6 hours as needed for severe pain 90 tablet 0  . potassium chloride SA (K-DUR,KLOR-CON) 20 MEQ tablet Take 1 tablet (20 mEq total) by mouth once. 10 tablet 0  . prochlorperazine (COMPAZINE) 10 MG tablet Take 1 tablet (10 mg total) by mouth every 6 (six) hours as needed. 30 tablet 0  . temazepam (RESTORIL) 30 MG capsule Take 1 capsule (30 mg total) by mouth at bedtime as needed. 30 capsule 0  . warfarin (COUMADIN) 5 MG tablet TAKE ONE TABLET BY MOUTH ONCE DAILY AS DIRECTED 60 tablet 0   No current facility-administered medications for this visit.   Facility-Administered Medications Ordered in Other Visits  Medication Dose Route Frequency Provider Last Rate Last Dose  . 0.9 %  sodium chloride infusion   Intravenous Once Curt Bears, MD   Stopped at 03/21/15 1720  . 0.9 %  sodium chloride infusion   Intravenous Once Curt Bears, MD      . 0.9 %  sodium chloride infusion   Intravenous Once Curt Bears, MD   Stopped at 07/31/15 1638    SURGICAL HISTORY:  Past Surgical History  Procedure Laterality Date  . Abdominal hysterectomy    . Ankle fracture surgery Left   . Ear cyst excision Left 04/16/2015    Procedure: EXCISION FACIAL CYST LEFT SIDE ;  Surgeon: Izora Gala, MD;  Location: [facility];  Service: ENT;  Laterality: Left;  . Peripheral vascular  catheterization  08/14/2015    Procedure: Lower Extremity Intervention;  Surgeon: Katha Cabal, MD;  Location: [place] [facility];  Service: Cardiovascular;;  . Peripheral vascular catheterization N/A 08/14/2015    Procedure: Abdominal Aortogram w/Lower Extremity;  Surgeon: Katha Cabal, MD;  Location: [facility];  Service: Cardiovascular;  Laterality: N/A;    REVIEW OF SYSTEMS:  Constitutional: positive for fatigue Eyes: negative Ears, nose, mouth, throat, and face: negative Respiratory: negative Cardiovascular: negative Gastrointestinal: negative Genitourinary:negative Integument/breast: negative Hematologic/lymphatic: negative Musculoskeletal:positive for bone pain Neurological: negative Behavioral/Psych: negative Endocrine: negative Allergic/Immunologic: negative   PHYSICAL EXAMINATION: General appearance: alert, cooperative and no distress Head: Normocephalic, without obvious abnormality, atraumatic Neck: no adenopathy, no JVD, supple, symmetrical, trachea midline and thyroid not enlarged, symmetric, no tenderness/mass/nodules Lymph nodes: Cervical, supraclavicular, and axillary nodes normal. Resp: clear to auscultation bilaterally Back: symmetric, no curvature. ROM normal. No CVA tenderness. Cardio: regular rate and rhythm,  S1, S2 normal, no murmur, click, rub or gallop GI: soft, non-tender; bowel sounds normal; no masses,  no organomegaly Extremities: extremities normal, atraumatic, no cyanosis or edema. Left heel ulcer, chronic, non healing Neurologic: Alert and oriented X 3, normal strength and tone. Normal symmetric reflexes. Normal coordination and gait  ECOG PERFORMANCE STATUS: 1 - Symptomatic but completely ambulatory  There were no vitals taken for this visit.  LABORATORY DATA: CBC Latest Ref Rng 09/04/2015 08/28/2015 08/21/2015  WBC 3.9 - 10.3 10e3/uL 7.6 7.0 4.7  Hemoglobin 11.6 - 15.9 g/dL 11.1(L) 10.6(L) 10.5(L)  Hematocrit 34.8 - 46.6 %  34.6(L) 33.4(L) 32.3(L)  Platelets 145 - 400 10e3/uL 165 130(L) 242   CMP Latest Ref Rng 09/04/2015 08/28/2015 08/21/2015  Glucose 70 - 140 mg/dl 187(H) 284(H) 163(H)  BUN 7.0 - 26.0 mg/dL 13.7 17.7 17.2  Creatinine 0.6 - 1.1 mg/dL 0.9 1.0 0.9  Sodium 136 - 145 mEq/L 140 139 141  Potassium 3.5 - 5.1 mEq/L 4.2 4.6 3.0(LL)  Chloride 101 - 111 mmol/L - - -  CO2 22 - 29 mEq/L 22 20(L) 26  Calcium 8.4 - 10.4 mg/dL 9.9 9.9 9.4  Total Protein 6.4 - 8.3 g/dL 6.4 5.8(L) 5.9(L)  Total Bilirubin 0.20 - 1.20 mg/dL 0.35 0.32 0.36  Alkaline Phos 40 - 150 U/L 69 62 60  AST 5 - 34 U/L '16 16 18  '$ ALT 0 - 55 U/L '14 17 18    '$ RADIOGRAPHIC STUDIES: No results found.  ASSESSMENT AND PLAN: This is a very pleasant 80 years old African-American female with history of multiple myeloma status post several chemotherapy regimens lastly including systemic chemotherapy according to the [facility] CA 204143 clinical trial with Elotuzumab, Revlimid and dexamethasone. She status post 5 cycles and tolerating her treatment fairly well. This was discontinued secondary to disease progression. the recent myeloma panel showed improved values  She completed treatment with Elotuzumab, Revlimid and dexamethasone but this was discontinued secondary to disease progression. The patient was started on treatment with systemic chemotherapy with Carfilzomib, Cytoxan and dexamethasone as salvage therapy. She is status post 12 cycles and tolerating this treatment fairly well.  I recommended for the patient to proceed with cycle #13 today as a scheduled. She will return for cycle 14 in 3 weeks, as well as weekly labs For the low back pain, the patient will continue on oxycodone every 6 hours as needed. She is to have a second evaluation regarding circulatory issues. For the history of deep venous thrombosis, her PT/INR is low today at 1.4, the patient will continue on Coumadin 5 mg except Mondays. For the hypokalemia, the patient has been on  potassium chloride 20 mEq by mouth daily for 10 days with good results She was advised to call immediately if she has any concerning symptoms in the interval. The patient voices understanding of current disease status and treatment options and is in agreement with the current care plan.  All questions were answered. The patient knows to call the clinic with any problems, questions or concerns. We can certainly see the patient much sooner if necessary.   ADDENDUM: Hematology/Oncology Attending: I had a face to face encounter with the patient today. I recommended her care plan. This is a very pleasant 80 years old African-American female with relapsed multiple myeloma is currently undergoing treatment with Carfilzomib, Cytoxan and Decadron status post 12 cycles. The patient has been tolerating her treatment well with no significant adverse effects.  The recent myeloma panel showed further improvement of  her condition. I discussed the lab result with the patient and her family. I recommended for her to continue her current treatment with the same regimen. Regarding her peripheral vascular disease, she is followed by her vascular surgeon at [facility] but the patient and her family are considering a second opinion with the vascular group in [place]. For the history of DVT thrombosis, she will continue on her current dose of Coumadin 5 mg by mouth daily except Mondays 2.5 mg. The patient would come back for follow-up visit on 09/18/2015 before starting cycle #14 of her treatment. For pain management she will continue with her current pain medication. The patient was advised to call immediately if she has any concerning symptoms in the interval.  Disclaimer: This note was dictated with voice recognition software. Similar sounding words can inadvertently be transcribed and may be missed upon review.  Eilleen Kempf., MD 09/04/2015

## 2015-09-05 ENCOUNTER — Ambulatory Visit (HOSPITAL_BASED_OUTPATIENT_CLINIC_OR_DEPARTMENT_OTHER): Payer: Medicare Other

## 2015-09-05 ENCOUNTER — Telehealth: Payer: Self-pay | Admitting: Internal Medicine

## 2015-09-05 DIAGNOSIS — Z5112 Encounter for antineoplastic immunotherapy: Secondary | ICD-10-CM | POA: Diagnosis not present

## 2015-09-05 DIAGNOSIS — C9000 Multiple myeloma not having achieved remission: Secondary | ICD-10-CM | POA: Diagnosis not present

## 2015-09-05 MED ORDER — DEXTROSE 5 % IV SOLN
36.0000 mg/m2 | Freq: Once | INTRAVENOUS | Status: AC
  Administered 2015-09-05: 54 mg via INTRAVENOUS
  Filled 2015-09-05: qty 27

## 2015-09-05 MED ORDER — SODIUM CHLORIDE 0.9 % IV SOLN
Freq: Once | INTRAVENOUS | Status: AC
  Administered 2015-09-05: 15:00:00 via INTRAVENOUS

## 2015-09-05 MED ORDER — SODIUM CHLORIDE 0.9 % IV SOLN
Freq: Once | INTRAVENOUS | Status: AC
  Administered 2015-09-05: 16:00:00 via INTRAVENOUS
  Filled 2015-09-05: qty 4

## 2015-09-05 NOTE — Patient Instructions (Signed)
[facility] Discharge Instructions for Patients Receiving Chemotherapy  Today you received the following chemotherapy agents: Kyprolis   To help prevent nausea and vomiting after your treatment, we encourage you to take your nausea medication as directed.    If you develop nausea and vomiting that is not controlled by your nausea medication, call the clinic.   BELOW ARE SYMPTOMS THAT SHOULD BE REPORTED IMMEDIATELY:  *FEVER GREATER THAN 100.5 F  *CHILLS WITH OR WITHOUT FEVER  NAUSEA AND VOMITING THAT IS NOT CONTROLLED WITH YOUR NAUSEA MEDICATION  *UNUSUAL SHORTNESS OF BREATH  *UNUSUAL BRUISING OR BLEEDING  TENDERNESS IN MOUTH AND THROAT WITH OR WITHOUT PRESENCE OF ULCERS  *URINARY PROBLEMS  *BOWEL PROBLEMS  UNUSUAL RASH Items with * indicate a potential emergency and should be followed up as soon as possible.  Feel free to call the clinic you have any questions or concerns. The clinic phone number is [phone].  Please show the CHEMO ALERT CARD at check-in to the Emergency Department and triage nurse.   

## 2015-09-05 NOTE — Telephone Encounter (Signed)
Per 1/3 pof f/u as scheduled with MM. Added tx and labs. Spoke with dtr Maudie Mercury and patient will get new schedule when she comes in today.

## 2015-09-06 ENCOUNTER — Telehealth: Payer: Self-pay | Admitting: *Deleted

## 2015-09-06 ENCOUNTER — Other Ambulatory Visit: Payer: Self-pay | Admitting: Medical Oncology

## 2015-09-06 ENCOUNTER — Ambulatory Visit: Payer: Medicare Other

## 2015-09-06 DIAGNOSIS — C9000 Multiple myeloma not having achieved remission: Secondary | ICD-10-CM

## 2015-09-06 MED ORDER — OXYCODONE HCL 5 MG PO TABS: ORAL_TABLET | ORAL | Status: DC

## 2015-09-06 NOTE — Telephone Encounter (Signed)
Patient's daughter [name] called requesting refill for Oxycodone 5 mg tablets.  Would like to pick up tomorrow.  Return number when ready for pick up is [phone].

## 2015-09-06 NOTE — Progress Notes (Signed)
rx locked up for pick up tomorrow. Daughter notified.

## 2015-09-07 ENCOUNTER — Encounter: Payer: Self-pay | Admitting: Vascular Surgery

## 2015-09-09 ENCOUNTER — Inpatient Hospital Stay (HOSPITAL_COMMUNITY)
Admission: EM | Admit: 2015-09-09 | Discharge: 2015-09-15 | DRG: 299 | Disposition: A | Discharge status: Home / Self Care | Payer: Medicare Other | Attending: Internal Medicine | Admitting: Internal Medicine

## 2015-09-09 ENCOUNTER — Encounter (HOSPITAL_COMMUNITY): Payer: Self-pay | Admitting: *Deleted

## 2015-09-09 ENCOUNTER — Emergency Department (HOSPITAL_COMMUNITY): Payer: Medicare Other

## 2015-09-09 ENCOUNTER — Inpatient Hospital Stay (HOSPITAL_COMMUNITY): Payer: Medicare Other

## 2015-09-09 DIAGNOSIS — I739 Peripheral vascular disease, unspecified: Secondary | ICD-10-CM | POA: Diagnosis not present

## 2015-09-09 DIAGNOSIS — Z823 Family history of stroke: Secondary | ICD-10-CM

## 2015-09-09 DIAGNOSIS — R05 Cough: Secondary | ICD-10-CM | POA: Diagnosis not present

## 2015-09-09 DIAGNOSIS — Z923 Personal history of irradiation: Secondary | ICD-10-CM

## 2015-09-09 DIAGNOSIS — E785 Hyperlipidemia, unspecified: Secondary | ICD-10-CM | POA: Diagnosis not present

## 2015-09-09 DIAGNOSIS — L03116 Cellulitis of left lower limb: Secondary | ICD-10-CM | POA: Diagnosis not present

## 2015-09-09 DIAGNOSIS — Z7984 Long term (current) use of oral hypoglycemic drugs: Secondary | ICD-10-CM

## 2015-09-09 DIAGNOSIS — I96 Gangrene, not elsewhere classified: Secondary | ICD-10-CM | POA: Diagnosis not present

## 2015-09-09 DIAGNOSIS — Z833 Family history of diabetes mellitus: Secondary | ICD-10-CM

## 2015-09-09 DIAGNOSIS — I70262 Atherosclerosis of native arteries of extremities with gangrene, left leg: Secondary | ICD-10-CM | POA: Diagnosis not present

## 2015-09-09 DIAGNOSIS — C9002 Multiple myeloma in relapse: Secondary | ICD-10-CM | POA: Diagnosis present

## 2015-09-09 DIAGNOSIS — I7389 Other specified peripheral vascular diseases: Secondary | ICD-10-CM | POA: Diagnosis not present

## 2015-09-09 DIAGNOSIS — M79672 Pain in left foot: Secondary | ICD-10-CM | POA: Diagnosis not present

## 2015-09-09 DIAGNOSIS — E876 Hypokalemia: Secondary | ICD-10-CM | POA: Diagnosis present

## 2015-09-09 DIAGNOSIS — E119 Type 2 diabetes mellitus without complications: Secondary | ICD-10-CM

## 2015-09-09 DIAGNOSIS — E1152 Type 2 diabetes mellitus with diabetic peripheral angiopathy with gangrene: Secondary | ICD-10-CM | POA: Diagnosis not present

## 2015-09-09 DIAGNOSIS — L03114 Cellulitis of left upper limb: Secondary | ICD-10-CM

## 2015-09-09 DIAGNOSIS — I10 Essential (primary) hypertension: Secondary | ICD-10-CM | POA: Diagnosis not present

## 2015-09-09 DIAGNOSIS — Z86718 Personal history of other venous thrombosis and embolism: Secondary | ICD-10-CM | POA: Diagnosis not present

## 2015-09-09 DIAGNOSIS — G9340 Encephalopathy, unspecified: Secondary | ICD-10-CM | POA: Diagnosis present

## 2015-09-09 DIAGNOSIS — C9000 Multiple myeloma not having achieved remission: Secondary | ICD-10-CM | POA: Diagnosis not present

## 2015-09-09 DIAGNOSIS — Z8249 Family history of ischemic heart disease and other diseases of the circulatory system: Secondary | ICD-10-CM

## 2015-09-09 DIAGNOSIS — L0390 Cellulitis, unspecified: Secondary | ICD-10-CM | POA: Diagnosis present

## 2015-09-09 DIAGNOSIS — M1990 Unspecified osteoarthritis, unspecified site: Secondary | ICD-10-CM | POA: Diagnosis not present

## 2015-09-09 DIAGNOSIS — I998 Other disorder of circulatory system: Secondary | ICD-10-CM

## 2015-09-09 DIAGNOSIS — Z7901 Long term (current) use of anticoagulants: Secondary | ICD-10-CM | POA: Diagnosis not present

## 2015-09-09 DIAGNOSIS — R059 Cough, unspecified: Secondary | ICD-10-CM

## 2015-09-09 DIAGNOSIS — R410 Disorientation, unspecified: Secondary | ICD-10-CM | POA: Diagnosis not present

## 2015-09-09 DIAGNOSIS — R600 Localized edema: Secondary | ICD-10-CM | POA: Diagnosis not present

## 2015-09-09 LAB — COMPREHENSIVE METABOLIC PANEL
ALBUMIN: 3.5 g/dL (ref 3.5–5.0)
ALK PHOS: 63 U/L (ref 38–126)
ALT: 16 U/L (ref 14–54)
ANION GAP: 11 (ref 5–15)
AST: 20 U/L (ref 15–41)
BILIRUBIN TOTAL: 0.7 mg/dL (ref 0.3–1.2)
BUN: 16 mg/dL (ref 6–20)
CALCIUM: 10.1 mg/dL (ref 8.9–10.3)
CO2: 28 mmol/L (ref 22–32)
Chloride: 101 mmol/L (ref 101–111)
Creatinine, Ser: 0.83 mg/dL (ref 0.44–1.00)
GFR calc Af Amer: >60 mL/min (ref >60)
GFR calc non Af Amer: >60 mL/min (ref >60)
GLUCOSE: 147 mg/dL — AB (ref 65–99)
Potassium: 3.4 mmol/L — ABNORMAL LOW (ref 3.5–5.1)
Sodium: 140 mmol/L (ref 135–145)
TOTAL PROTEIN: 6.3 g/dL — AB (ref 6.5–8.1)

## 2015-09-09 LAB — CBC WITH DIFFERENTIAL/PLATELET
BASOS PCT: 0 %
Basophils Absolute: 0 10*3/uL (ref 0.0–0.1)
Eosinophils Absolute: 0 10*3/uL (ref 0.0–0.7)
Eosinophils Relative: 0 %
HEMATOCRIT: 32.9 % — AB (ref 36.0–46.0)
HEMOGLOBIN: 10.7 g/dL — AB (ref 12.0–15.0)
LYMPHS ABS: 0.7 10*3/uL (ref 0.7–4.0)
LYMPHS PCT: 7 %
MCH: 31.9 pg (ref 26.0–34.0)
MCHC: 32.5 g/dL (ref 30.0–36.0)
MCV: 98.2 fL (ref 78.0–100.0)
MONOS PCT: 5 %
Monocytes Absolute: 0.5 10*3/uL (ref 0.1–1.0)
NEUTROS ABS: 8 10*3/uL — AB (ref 1.7–7.7)
NEUTROS PCT: 88 %
Platelets: 182 10*3/uL (ref 150–400)
RBC: 3.35 MIL/uL — ABNORMAL LOW (ref 3.87–5.11)
RDW: 14.6 % (ref 11.5–15.5)
WBC: 9.1 10*3/uL (ref 4.0–10.5)

## 2015-09-09 LAB — PROTIME-INR
INR: 1.66 — AB (ref 0.00–1.49)
Prothrombin Time: 19.6 seconds — ABNORMAL HIGH (ref 11.6–15.2)

## 2015-09-09 LAB — GLUCOSE, CAPILLARY: Glucose-Capillary: 147 mg/dL — ABNORMAL HIGH (ref 65–99)

## 2015-09-09 LAB — I-STAT CG4 LACTIC ACID, ED: Lactic Acid, Venous: 1.79 mmol/L (ref 0.5–2.0)

## 2015-09-09 MED ORDER — HYDROCHLOROTHIAZIDE 12.5 MG PO CAPS
12.5000 mg | ORAL_CAPSULE | Freq: Every day | ORAL | Status: DC
  Administered 2015-09-10: 12.5 mg via ORAL
  Filled 2015-09-09: qty 1

## 2015-09-09 MED ORDER — LISINOPRIL-HYDROCHLOROTHIAZIDE 20-12.5 MG PO TABS: 1.0000 | ORAL_TABLET | Freq: Every day | ORAL | Status: DC

## 2015-09-09 MED ORDER — SODIUM CHLORIDE 0.9 % IV SOLN
INTRAVENOUS | Status: DC
  Administered 2015-09-09: 23:00:00 via INTRAVENOUS

## 2015-09-09 MED ORDER — POTASSIUM CHLORIDE CRYS ER 20 MEQ PO TBCR
20.0000 meq | EXTENDED_RELEASE_TABLET | Freq: Once | ORAL | Status: AC
  Administered 2015-09-09: 20 meq via ORAL
  Filled 2015-09-09: qty 1

## 2015-09-09 MED ORDER — ONDANSETRON HCL 4 MG/2ML IJ SOLN: 4.0000 mg | Freq: Four times a day (QID) | INTRAMUSCULAR | Status: DC | PRN

## 2015-09-09 MED ORDER — ONDANSETRON HCL 4 MG PO TABS: 4.0000 mg | ORAL_TABLET | Freq: Four times a day (QID) | ORAL | Status: DC | PRN

## 2015-09-09 MED ORDER — WARFARIN SODIUM 5 MG PO TABS: 5.0000 mg | ORAL_TABLET | Freq: Every day | ORAL | Status: DC

## 2015-09-09 MED ORDER — CALCIUM CARBONATE-VITAMIN D 500-200 MG-UNIT PO TABS
1.0000 | ORAL_TABLET | Freq: Every day | ORAL | Status: DC
  Administered 2015-09-09 – 2015-09-15 (×7): 1 via ORAL
  Filled 2015-09-09 (×7): qty 1

## 2015-09-09 MED ORDER — INSULIN ASPART 100 UNIT/ML ~~LOC~~ SOLN
0.0000 [IU] | Freq: Three times a day (TID) | SUBCUTANEOUS | Status: DC
  Administered 2015-09-10 (×2): 2 [IU] via SUBCUTANEOUS
  Administered 2015-09-10: 1 [IU] via SUBCUTANEOUS
  Administered 2015-09-11 (×2): 2 [IU] via SUBCUTANEOUS
  Administered 2015-09-12 – 2015-09-13 (×4): 1 [IU] via SUBCUTANEOUS
  Administered 2015-09-14: 2 [IU] via SUBCUTANEOUS
  Administered 2015-09-14 – 2015-09-15 (×2): 1 [IU] via SUBCUTANEOUS

## 2015-09-09 MED ORDER — TEMAZEPAM 15 MG PO CAPS
30.0000 mg | ORAL_CAPSULE | Freq: Every evening | ORAL | Status: DC | PRN
  Filled 2015-09-09 (×2): qty 2

## 2015-09-09 MED ORDER — WARFARIN - PHARMACIST DOSING INPATIENT: Freq: Every day | Status: DC

## 2015-09-09 MED ORDER — ACETAMINOPHEN 650 MG RE SUPP: 650.0000 mg | Freq: Four times a day (QID) | RECTAL | Status: DC | PRN

## 2015-09-09 MED ORDER — ACETAMINOPHEN 325 MG PO TABS
650.0000 mg | ORAL_TABLET | Freq: Four times a day (QID) | ORAL | Status: DC | PRN
  Administered 2015-09-09 – 2015-09-14 (×8): 650 mg via ORAL
  Filled 2015-09-09 (×9): qty 2

## 2015-09-09 MED ORDER — INSULIN ASPART 100 UNIT/ML ~~LOC~~ SOLN: 0.0000 [IU] | Freq: Every day | SUBCUTANEOUS | Status: DC

## 2015-09-09 MED ORDER — WARFARIN - PHYSICIAN DOSING INPATIENT: Freq: Every day | Status: DC

## 2015-09-09 MED ORDER — LISINOPRIL 20 MG PO TABS
20.0000 mg | ORAL_TABLET | Freq: Every day | ORAL | Status: DC
  Administered 2015-09-10 – 2015-09-15 (×6): 20 mg via ORAL
  Filled 2015-09-09 (×6): qty 1

## 2015-09-09 MED ORDER — CLOPIDOGREL BISULFATE 75 MG PO TABS
75.0000 mg | ORAL_TABLET | Freq: Every day | ORAL | Status: DC
  Administered 2015-09-10 – 2015-09-15 (×6): 75 mg via ORAL
  Filled 2015-09-09 (×7): qty 1

## 2015-09-09 MED ORDER — VANCOMYCIN HCL IN DEXTROSE 1-5 GM/200ML-% IV SOLN
1000.0000 mg | Freq: Once | INTRAVENOUS | Status: AC
  Administered 2015-09-09: 1000 mg via INTRAVENOUS
  Filled 2015-09-09: qty 200

## 2015-09-09 MED ORDER — SODIUM CHLORIDE 0.9 % IV SOLN: INTRAVENOUS | Status: DC

## 2015-09-09 MED ORDER — WARFARIN SODIUM 7.5 MG PO TABS
7.5000 mg | ORAL_TABLET | Freq: Once | ORAL | Status: DC
  Filled 2015-09-09: qty 1

## 2015-09-09 MED ORDER — VANCOMYCIN HCL 500 MG IV SOLR
500.0000 mg | Freq: Two times a day (BID) | INTRAVENOUS | Status: DC
  Administered 2015-09-10 – 2015-09-14 (×9): 500 mg via INTRAVENOUS
  Filled 2015-09-09 (×10): qty 500

## 2015-09-09 MED ORDER — ACYCLOVIR 400 MG PO TABS
400.0000 mg | ORAL_TABLET | Freq: Two times a day (BID) | ORAL | Status: DC
  Administered 2015-09-09 – 2015-09-15 (×12): 400 mg via ORAL
  Filled 2015-09-09 (×14): qty 1

## 2015-09-09 MED ORDER — OXYCODONE HCL 5 MG PO TABS: 5.0000 mg | ORAL_TABLET | ORAL | Status: DC | PRN

## 2015-09-09 MED ORDER — ALUM & MAG HYDROXIDE-SIMETH 200-200-20 MG/5ML PO SUSP: 30.0000 mL | Freq: Four times a day (QID) | ORAL | Status: DC | PRN

## 2015-09-09 MED ORDER — MORPHINE SULFATE (PF) 2 MG/ML IV SOLN
2.0000 mg | Freq: Once | INTRAVENOUS | Status: AC
  Administered 2015-09-09: 2 mg via INTRAVENOUS
  Filled 2015-09-09: qty 1

## 2015-09-09 MED ORDER — HYDROMORPHONE HCL 1 MG/ML IJ SOLN
0.5000 mg | INTRAMUSCULAR | Status: DC | PRN
  Administered 2015-09-09: 1 mg via INTRAVENOUS
  Filled 2015-09-09: qty 1

## 2015-09-09 NOTE — Progress Notes (Addendum)
ANTIBIOTIC CONSULT NOTE - INITIAL  Pharmacy Consult for Vancomycin Indication: Osteomyelitis  No Known Allergies  Patient Measurements: Height: 62 inches TBW: 52 kg   Vital Signs: Temp: 98.7 F (37.1 C) (01/08 1804) Temp Source: Oral (01/08 1804) BP: 172/70 mmHg (01/08 1835) Pulse Rate: 84 (01/08 1835) Intake/Output from previous day:   Intake/Output from this shift:    Labs:  Recent Labs  09/09/15 1609  WBC 9.1  HGB 10.7*  PLT 182  CREATININE 0.83   Estimated Creatinine Clearance: 44.9 mL/min (by C-G formula based on Cr of 0.83). No results for input(s): VANCOTROUGH, VANCOPEAK, VANCORANDOM, GENTTROUGH, GENTPEAK, GENTRANDOM, TOBRATROUGH, TOBRAPEAK, TOBRARND, AMIKACINPEAK, AMIKACINTROU, AMIKACIN in the last 72 hours.   Microbiology: No results found for this or any previous visit (from the past 720 hour(s)).  Medical History: Past Medical History  Diagnosis Date  . Blood transfusion   . Depression     Mild  . History of chicken pox   . Hyperlipidemia   . Hypertension   . Degenerative joint disease   . Phlebitis   . Multiple myeloma     per Dr. Julien Nordmann, s/p palliative radiation for leg pain 2011 per Dr. Sondra Come  . Deep vein thrombosis of bilateral lower extremities (HCC)   . Family history of anesthesia complication     DAUGHTER CPR AFTER  . Cardiomyopathy ([place])   . Diabetes mellitus     Steroid related  . UTI (urinary tract infection) 09/08/2013  . History of radiation therapy 08/30/13-09/20/13    20 gray to lower lumbar/upper sacrum    Medications:  Scheduled:  . insulin aspart  0-5 Units Subcutaneous QHS  . [START ON 09/10/2015] insulin aspart  0-9 Units Subcutaneous TID WC   Infusions:  . sodium chloride     PRN:  Assessment: 79yoF with h/o multiple myeloma and currently on chemo therapy, HTN, DM2, hyperlipidemia and on Coumadin for h/o DVT presenting to the ER with increased pain and redness of the left foot that is worsening for the past 3  days. Vancomycin to be started for osteomyelitis. Vancomycin 1gm IV x1 given in the ER  Goal of Therapy:  Vancomycin trough level 15-20 mcg/ml  Eradication of infection Appropriate antibiotic dosing for indication and renal function.   Plan:  Vancomycin 58m IV Q12h starting tomorrow  Will check Vanco trough level at steady state Will f/u Scr and Cx  FGarnet Sierras1/04/2016,7:54 PM    Addendum:  Warfarin to be dosed by pharmacy for h/o DVT. Pt was on Warfarin 54mdaily except 2.41m108mn Mon with last dose on 1/7 _0 . INR=1.66 (INR goal: 2-3). Will give Coumadin 7.41mg78m x1 tonight and check daily PT/INR.  Thanks, MaryGarnet SierrasarmD 09/09/2015

## 2015-09-09 NOTE — ED Notes (Signed)
PATIENT CAN GO UP TO FLOOR AT 1900

## 2015-09-09 NOTE — ED Provider Notes (Signed)
CSN: [id number]     Arrival date & time 09/09/15  1323 History   First MD Initiated Contact with Patient 09/09/15 1521     Chief Complaint  Patient presents with  . Foot Pain    left     (Consider location/radiation/quality/duration/timing/severity/associated sxs/prior Treatment) HPI Comments: Pt c/o left foot pain w associated erythema that's worse x 2 days No fever or recent trauma H/o pvd and had an agioplasty last month due to decreased blood flow Pain is sharp and constant and worse with walking\ Pt is receiving chemotx currently for multiple myeloma Increase discoloartion to left middle toe recently No tx used pta  Patient is a 80 y.o. female presenting with lower extremity pain. The history is provided by the patient and a relative.  Foot Pain    Past Medical History  Diagnosis Date  . Blood transfusion   . Depression     Mild  . History of chicken pox   . Hyperlipidemia   . Hypertension   . Degenerative joint disease   . Phlebitis   . Multiple myeloma     per Dr. Julien Nordmann, s/p palliative radiation for leg pain 2011 per Dr. Sondra Come  . Deep vein thrombosis of bilateral lower extremities (HCC)   . Family history of anesthesia complication     DAUGHTER CPR AFTER  . Cardiomyopathy ([place])   . Diabetes mellitus     Steroid related  . UTI (urinary tract infection) 09/08/2013  . History of radiation therapy 08/30/13-09/20/13    20 gray to lower lumbar/upper sacrum   Past Surgical History  Procedure Laterality Date  . Abdominal hysterectomy    . Ankle fracture surgery Left   . Ear cyst excision Left 04/16/2015    Procedure: EXCISION FACIAL CYST LEFT SIDE ;  Surgeon: Izora Gala, MD;  Location: [place];  Service: ENT;  Laterality: Left;  . Peripheral vascular catheterization  08/14/2015    Procedure: Lower Extremity Intervention;  Surgeon: Katha Cabal, MD;  Location: [place] [facility];  Service: Cardiovascular;;  . Peripheral vascular  catheterization N/A 08/14/2015    Procedure: Abdominal Aortogram w/Lower Extremity;  Surgeon: Katha Cabal, MD;  Location: [place] [facility];  Service: Cardiovascular;  Laterality: N/A;   Family History  Problem Relation Age of Onset  . Diabetes Mother   . Hypertension Mother   . Arthritis Mother   . Stroke Mother   . Diabetes Father   . Hypertension Father   . Arthritis Father   . Stroke Father   . Cancer Sister     stomach  . Stroke Brother   . Cancer Brother     prostate <50  . Stroke Daughter   . Stroke Son   . Arthritis Other     Parents  . Cancer Other     Colon, 1st degree relative <60  . Diabetes Other     Parents  . Stroke Other     1st degree relative <60   Social History  Substance Use Topics  . Smoking status: Former Smoker -- 1.00 packs/day for 13 years    Quit date: 09/01/1973  . Smokeless tobacco: Former Systems developer    Quit date: 04/16/1975     Comment: QIUT MANY YEARS AGO  . Alcohol Use: No   OB History    No data available     Review of Systems  All other systems reviewed and are negative.     Allergies  Review of patient's allergies  indicates no known allergies.  Home Medications   Prior to Admission medications   Medication Sig Start Date End Date Taking? Authorizing Provider  acyclovir (ZOVIRAX) 400 MG tablet TAKE ONE TABLET BY MOUTH TWICE DAILY 09/04/15  Yes Curt Bears, MD  ALPRAZolam (XANAX) 0.5 MG tablet TAKE ONE TABLET BY MOUTH TWICE DAILY AS NEEDED FOR ANXIETY Patient taking differently: TAKE 0.49m to 0.550mBY MOUTH TWICE DAILY AS NEEDED FOR ANXIETY 09/04/15  Yes MoCurt BearsMD  Calcium Carbonate-Vitamin D 600-400 MG-UNIT per tablet Take 1 tablet by mouth.    Yes Historical Provider, MD  clopidogrel (PLAVIX) 75 MG tablet Take 1 tablet (75 mg total) by mouth daily. 08/15/15  Yes GrKatha CabalMD  dexamethasone (DECADRON) 4 MG tablet TAKE 10 TABS BY MOUTH WEEKLY ON DAY 1 WITH CHEMOTHERAPY TREATMENT CARFILZOMIB 09/04/15  Yes  MoCurt BearsMD  glipiZIDE (GLUCOTROL XL) 2.5 MG 24 hr tablet TAKE ONE TABLET BY MOUTH ONCE DAILY 07/16/15  Yes GrTonia GhentMD  lisinopril-hydrochlorothiazide (PRINZIDE,ZESTORETIC) 20-12.5 MG per tablet Take 1 tablet by mouth daily. 01/04/15  Yes GrTonia GhentMD  loperamide (IMODIUM A-D) 2 MG tablet Take 1 tablet (2 mg total) by mouth 4 (four) times daily as needed for diarrhea or loose stools. 09/12/13  Yes Adeline C Saralyn PilarMD  oxyCODONE (OXY IR/ROXICODONE) 5 MG immediate release tablet Take  1-2 tablets (1035motal) by mouth every 6 hours as needed for severe pain 09/06/15  Yes MohCurt BearsD  potassium chloride SA (K-DUR,KLOR-CON) 20 MEQ tablet Take 1 tablet (20 mEq total) by mouth once. 08/21/15  Yes MohCurt BearsD  prochlorperazine (COMPAZINE) 10 MG tablet Take 1 tablet (10 mg total) by mouth every 6 (six) hours as needed. 11/21/14  Yes MohCurt BearsD  temazepam (RESTORIL) 30 MG capsule Take 1 capsule (30 mg total) by mouth at bedtime as needed. 03/07/15  Yes MohCurt BearsD  warfarin (COUMADIN) 5 MG tablet TAKE ONE TABLET BY MOUTH ONCE DAILY AS DIRECTED Patient taking differently: take 5mg54mvery day except monday take 2.5mgs43m/17/16  Yes MohamCurt Bears  BP 179/89 mmHg  Pulse 85  Temp(Src) 99.6 F (37.6 C) (Oral)  Resp 18  SpO2 100% Physical Exam  Constitutional: She is oriented to person, place, and time. She appears well-developed and well-nourished.  Non-toxic appearance. No distress.  HENT:  Head: Normocephalic and atraumatic.  Eyes: Conjunctivae, EOM and lids are normal. Pupils are equal, round, and reactive to light.  Neck: Normal range of motion. Neck supple. No tracheal deviation present. No thyroid mass present.  Cardiovascular: Normal rate, regular rhythm and normal heart sounds.  Exam reveals no gallop.   No murmur heard. Pulmonary/Chest: Effort normal and breath sounds normal. No stridor. No respiratory distress. She has no decreased breath  sounds. She has no wheezes. She has no rhonchi. She has no rales.  Abdominal: Soft. Normal appearance and bowel sounds are normal. She exhibits no distension. There is no tenderness. There is no rebound and no CVA tenderness.  Musculoskeletal: Normal range of motion. She exhibits no edema or tenderness.       Feet:  Neurological: She is alert and oriented to person, place, and time. She has normal strength. No cranial nerve deficit or sensory deficit. GCS eye subscore is 4. GCS verbal subscore is 5. GCS motor subscore is 6.  Skin: Skin is warm and dry. No abrasion and no rash noted.  Psychiatric: She has a normal mood and affect. Her  speech is normal and behavior is normal.  Nursing note and vitals reviewed.   ED Course  Procedures (including critical care time) Labs Review Labs Reviewed  CBC WITH DIFFERENTIAL/PLATELET  COMPREHENSIVE METABOLIC PANEL  PROTIME-INR  I-STAT CG4 LACTIC ACID, ED    Imaging Review No results found. I have personally reviewed and evaluated these images and lab results as part of my medical decision-making.   EKG Interpretation None      MDM   Final diagnoses:  None    Patient started on IV vancomycin and will be admitted to the medicine service    Lacretia Leigh, MD 09/09/15 1827

## 2015-09-09 NOTE — H&P (Signed)
[facility] Admission History and Physical       SENNIE BORDEN KET:[id number] DOB: [birth year] DOA: 09/09/2015  Referring physician:  PCP: [name], MD  Specialists:   Chief Complaint: Increasing Redness and pain in Left Foot  HPI: [name] is a 80 y.o. female with a history of Multiple Myeloma  (dx 2009) currently on Chemo Rx, HTN, DM2, Hyperlipidemia, Hx DVTs on Coumadin Rx, and PVD who presents to the ED with complaints of increased pain and redness of the left foot worsening over the past 3 days.    She had been seen by Vascular  Dr. Gilda Crease in [place] on 12/28 and had an aortogram due to ischemia of her middle toes of the left foot.   She was evaluated in the ED, and placed on IV Vancomycin and referred for admission.     Review of Systems:  Constitutional: No Weight Loss, No Weight Gain, Night Sweats, Fevers, Chills, Dizziness, Light Headedness, Fatigue, or Generalized Weakness HEENT: No Headaches, Difficulty Swallowing,Tooth/Dental Problems,Sore Throat,  No Sneezing, Rhinitis, Ear Ache, Nasal Congestion, or Post Nasal Drip,  Cardio-vascular:  No Chest pain, Orthopnea, PND, Edema in Lower Extremities, Anasarca, Dizziness, Palpitations  Resp: No Dyspnea, No DOE, No Productive Cough, No Non-Productive Cough, No Hemoptysis, No Wheezing.    GI: No Heartburn, Indigestion, Abdominal Pain, Nausea, Vomiting, Diarrhea, Constipation, Hematemesis, Hematochezia, Melena, Change in Bowel Habits,  Loss of Appetite  GU: No Dysuria, No Change in Color of Urine, No Urgency or Urinary Frequency, No Flank pain.  Musculoskeletal:  +Left Foot Pain, No Decreased Range of Motion, No Back Pain.  Neurologic: No Syncope, No Seizures, Muscle Weakness, Paresthesia, Vision Disturbance or Loss, No Diplopia, No Vertigo, No Difficulty Walking,  Skin: No Rash or Lesions. Psych: No Change in Mood or Affect, No Depression or Anxiety, No Memory loss, No Confusion, or Hallucinations   Past Medical  History  Diagnosis Date  . Blood transfusion   . Depression     Mild  . History of chicken pox   . Hyperlipidemia   . Hypertension   . Degenerative joint disease   . Phlebitis   . Multiple myeloma     per Dr. [name], s/p palliative radiation for leg pain 2011 per Dr. Roselind Messier  . Deep vein thrombosis of bilateral lower extremities (HCC)   . Family history of anesthesia complication     DAUGHTER CPR AFTER  . Cardiomyopathy (HCC)   . Diabetes mellitus     Steroid related  . UTI (urinary tract infection) 09/08/2013  . History of radiation therapy 08/30/13-09/20/13    20 gray to lower lumbar/upper sacrum     Past Surgical History  Procedure Laterality Date  . Abdominal hysterectomy    . Ankle fracture surgery Left   . Ear cyst excision Left 04/16/2015    Procedure: EXCISION FACIAL CYST LEFT SIDE ;  Surgeon: Serena Colonel, MD;  Location: [facility];  Service: ENT;  Laterality: Left;  . Peripheral vascular catheterization  08/14/2015    Procedure: Lower Extremity Intervention;  Surgeon: Renford Dills, MD;  Location: [facility];  Service: Cardiovascular;;  . Peripheral vascular catheterization N/A 08/14/2015    Procedure: Abdominal Aortogram w/Lower Extremity;  Surgeon: Renford Dills, MD;  Location: [facility];  Service: Cardiovascular;  Laterality: N/A;      Prior to Admission medications   Medication Sig Start Date End Date Taking? Authorizing Provider  acyclovir (ZOVIRAX) 400 MG tablet TAKE ONE TABLET  BY MOUTH TWICE DAILY 09/04/15  Yes Curt Bears, MD  ALPRAZolam (XANAX) 0.5 MG tablet TAKE ONE TABLET BY MOUTH TWICE DAILY AS NEEDED FOR ANXIETY Patient taking differently: TAKE 0.'25mg'$  to 0.'5mg'$  BY MOUTH TWICE DAILY AS NEEDED FOR ANXIETY 09/04/15  Yes Curt Bears, MD  Calcium Carbonate-Vitamin D 600-400 MG-UNIT per tablet Take 1 tablet by mouth.    Yes Historical Provider, MD  clopidogrel (PLAVIX) 75 MG tablet Take 1 tablet (75 mg total) by  mouth daily. 08/15/15  Yes Katha Cabal, MD  dexamethasone (DECADRON) 4 MG tablet TAKE 10 TABS BY MOUTH WEEKLY ON DAY 1 WITH CHEMOTHERAPY TREATMENT CARFILZOMIB 09/04/15  Yes Curt Bears, MD  glipiZIDE (GLUCOTROL XL) 2.5 MG 24 hr tablet TAKE ONE TABLET BY MOUTH ONCE DAILY 07/16/15  Yes Tonia Ghent, MD  lisinopril-hydrochlorothiazide (PRINZIDE,ZESTORETIC) 20-12.5 MG per tablet Take 1 tablet by mouth daily. 01/04/15  Yes Tonia Ghent, MD  loperamide (IMODIUM A-D) 2 MG tablet Take 1 tablet (2 mg total) by mouth 4 (four) times daily as needed for diarrhea or loose stools. 09/12/13  Yes Adeline Saralyn Pilar, MD  oxyCODONE (OXY IR/ROXICODONE) 5 MG immediate release tablet Take  1-2 tablets ('10mg'$  total) by mouth every 6 hours as needed for severe pain 09/06/15  Yes Curt Bears, MD  potassium chloride SA (K-DUR,KLOR-CON) 20 MEQ tablet Take 1 tablet (20 mEq total) by mouth once. 08/21/15  Yes Curt Bears, MD  prochlorperazine (COMPAZINE) 10 MG tablet Take 1 tablet (10 mg total) by mouth every 6 (six) hours as needed. 11/21/14  Yes Curt Bears, MD  temazepam (RESTORIL) 30 MG capsule Take 1 capsule (30 mg total) by mouth at bedtime as needed. 03/07/15  Yes Curt Bears, MD  warfarin (COUMADIN) 5 MG tablet TAKE ONE TABLET BY MOUTH ONCE DAILY AS DIRECTED Patient taking differently: take '5mg'$ s every day except monday take 2.'5mg'$ s 06/18/15  Yes Curt Bears, MD     No Known Allergies  Social History:  reports that she quit smoking about 42 years ago. She quit smokeless tobacco use about 40 years ago. She reports that she does not drink alcohol or use illicit drugs.    Family History  Problem Relation Age of Onset  . Diabetes Mother   . Hypertension Mother   . Arthritis Mother   . Stroke Mother   . Diabetes Father   . Hypertension Father   . Arthritis Father   . Stroke Father   . Cancer Sister     stomach  . Stroke Brother   . Cancer Brother     prostate <50  . Stroke Daughter   .  Stroke Son   . Arthritis Other     Parents  . Cancer Other     Colon, 1st degree relative <60  . Diabetes Other     Parents  . Stroke Other     1st degree relative <60       Physical Exam:  GEN:  Pleasant Thin Elderly 80 y.o. African American female examined and in no acute distress; cooperative with exam Filed Vitals:   09/09/15 1539 09/09/15 1639 09/09/15 1726 09/09/15 1804  BP: 179/89 180/73 160/102 172/70  Pulse: 85 80 55 86  Temp: 99.6 F (37.6 C)   98.7 F (37.1 C)  TempSrc: Oral   Oral  Resp: '18 16 17 20  '$ SpO2: 100% 100% 97% 98%   Blood pressure 172/70, pulse 86, temperature 98.7 F (37.1 C), temperature source Oral, resp. rate 20, SpO2 98 %.  PSYCH: She is alert and oriented x4; does not appear anxious does not appear depressed; affect is normal HEENT: Normocephalic and Atraumatic, Mucous membranes pink; PERRLA; EOM intact; Fundi:  Benign;  No scleral icterus, Nares: Patent, Oropharynx: Clear, Edentulous with Dentures Present,    Neck:  FROM, No Cervical Lymphadenopathy nor Thyromegaly or Carotid Bruit; No JVD; Breasts:: Not examined CHEST WALL: No tenderness CHEST: Normal respiration, clear to auscultation bilaterally HEART: Regular rate and rhythm; no murmurs rubs or gallops BACK: No kyphosis or scoliosis; No CVA tenderness ABDOMEN: Positive Bowel Sounds, Scaphoid, Soft Non-Tender, No Rebound or Guarding; No Masses, No Organomegaly. Rectal Exam: Not done EXTREMITIES: Left Foot with Ischemic 3rd and 4th Toes, + Erythema and Calor of eEft Foot,   RLE :  No Cyanosis, Clubbing, or Edema; No Ulcerations. Genitalia: not examined PULSES: 2+ and symmetric SKIN: Normal hydration no rash or ulceration CNS:  Alert and Oriented x 4, No Focal Deficits Vascular: pulses palpable throughout    Labs on Admission:  Basic Metabolic Panel:  Recent Labs Lab 09/04/15 1212 09/09/15 1609  NA 140 140  K 4.2 3.4*  CL  --  101  CO2 22 28  GLUCOSE 187* 147*  BUN 13.7 16    CREATININE 0.9 0.83  CALCIUM 9.9 10.1   Liver Function Tests:  Recent Labs Lab 09/04/15 1212 09/09/15 1609  AST 16 20  ALT 14 16  ALKPHOS 69 63  BILITOT 0.35 0.7  PROT 6.4 6.3*  ALBUMIN 3.4* 3.5   No results for input(s): LIPASE, AMYLASE in the last 168 hours. No results for input(s): AMMONIA in the last 168 hours. CBC:  Recent Labs Lab 09/04/15 1212 09/09/15 1609  WBC 7.6 9.1  NEUTROABS 7.3* 8.0*  HGB 11.1* 10.7*  HCT 34.6* 32.9*  MCV 97.6 98.2  PLT 165 182   Cardiac Enzymes: No results for input(s): CKTOTAL, CKMB, CKMBINDEX, TROPONINI in the last 168 hours.  BNP (last 3 results) No results for input(s): BNP in the last 8760 hours.  ProBNP (last 3 results) No results for input(s): PROBNP in the last 8760 hours.  CBG: No results for input(s): GLUCAP in the last 168 hours.  Radiological Exams on Admission: Dg Foot Complete Left  09/09/2015  CLINICAL DATA:  Chronic left foot pain for over 1 years. Redness noted to left foot, accompanied with necrosis to 2nd-4th toes. Pt's daughter states that pt has poor blood flow. Pt currently undergoing chemo for multiple myeloma. EXAM: LEFT FOOT - COMPLETE 3+ VIEW COMPARISON:  None. FINDINGS: Extensive diffuse small vessel calcification. No fracture or dislocation. Postsurgical change at the ankle. Limited evaluation of first distal phalanx due to persistent flexion at the first interphalangeal joint. As seen best on the lateral view, no acute findings appreciated however. No periosteal reaction or cortical destruction identified. Prominent heel spur. IMPRESSION: No acute abnormalities Electronically Signed   By: Skipper Cliche M.D.   On: 09/09/2015 16:01     EKG: Independently reviewed.     Assessment/Plan:  80 y.o. female with  Principal Problem:   1.      Cellulitis   IV Vancomycin   Active Problems:   2.     PVD (peripheral vascular disease) ([place])   Consult Vascular in AM   MRI of Left Foot     3.     Multiple  myeloma ([place])   On chemo RX   Notify Oncology in AM     4.     Diabetes mellitus without complication (  [place])   Hold Glipizide Rx   SSI coverage PRN     5.     Hyperlipidemia   continue     6.     Essential hypertension   Continue Lisinopril/HCTZ   Monitor BPs     7.     Long term current use of anticoagulant therapy- due to Hx of BLE DVTs   On Coumadin Rx     8.     DVT Prophylaxis   On Coumadin Rx   Pharmacy adjustment PRN   Code Status:     FULL CODE     Family Communication:   2 Daughters at Bedside     Disposition Plan:    Inpatient Status       Time spent:  56 Minutes      Theressa Millard [facility] Pager [phone]   If [place] Please Contact the Day Rounding Team MD for [facility]  If 7PM-7AM, Please Contact Night-Floor Coverage  [email or web address] Password [facility] 09/09/2015, 6:23 PM     ADDENDUM:   Patient was seen and examined on 09/09/2015

## 2015-09-09 NOTE — ED Notes (Signed)
Pt in radiology 

## 2015-09-09 NOTE — ED Notes (Signed)
Pt transported MRI via stretcher with tech.

## 2015-09-09 NOTE — ED Notes (Addendum)
Patient comes from home where she lives with her son with c/o left foot pain for over a year.  Patient had an aortogram in December and daughter states that was to improve blood flow to LE.  Patient states LLE pain has gotten worse over last several days.  Patient's left foot is red and edematous with pulses difficult to palpate.  Patient's middle toes are necrotic and patient's daughter states they have been for several weeks.  Patient was last evaluated by vascular surgeon 12/28.  Patient is actively receiving chemotherapy to treat multiple myeloma, last chemo tx Wednesday, 1/4.

## 2015-09-10 ENCOUNTER — Inpatient Hospital Stay (HOSPITAL_COMMUNITY): Payer: Medicare Other

## 2015-09-10 LAB — CBC
HCT: 30.1 % — ABNORMAL LOW (ref 36.0–46.0)
Hemoglobin: 9.7 g/dL — ABNORMAL LOW (ref 12.0–15.0)
MCH: 31.7 pg (ref 26.0–34.0)
MCHC: 32.2 g/dL (ref 30.0–36.0)
MCV: 98.4 fL (ref 78.0–100.0)
PLATELETS: 153 10*3/uL (ref 150–400)
RBC: 3.06 MIL/uL — ABNORMAL LOW (ref 3.87–5.11)
RDW: 14.9 % (ref 11.5–15.5)
WBC: 8.4 10*3/uL (ref 4.0–10.5)

## 2015-09-10 LAB — URINE MICROSCOPIC-ADD ON

## 2015-09-10 LAB — BASIC METABOLIC PANEL
Anion gap: 9 (ref 5–15)
BUN: 18 mg/dL (ref 6–20)
CHLORIDE: 103 mmol/L (ref 101–111)
CO2: 28 mmol/L (ref 22–32)
CREATININE: 0.8 mg/dL (ref 0.44–1.00)
Calcium: 9.6 mg/dL (ref 8.9–10.3)
GFR calc Af Amer: >60 mL/min (ref >60)
GFR calc non Af Amer: >60 mL/min (ref >60)
Glucose, Bld: 141 mg/dL — ABNORMAL HIGH (ref 65–99)
Potassium: 3.1 mmol/L — ABNORMAL LOW (ref 3.5–5.1)
SODIUM: 140 mmol/L (ref 135–145)

## 2015-09-10 LAB — GLUCOSE, CAPILLARY
GLUCOSE-CAPILLARY: 165 mg/dL — AB (ref 65–99)
Glucose-Capillary: 153 mg/dL — ABNORMAL HIGH (ref 65–99)
Glucose-Capillary: 166 mg/dL — ABNORMAL HIGH (ref 65–99)
Glucose-Capillary: 114 mg/dL — ABNORMAL HIGH (ref 65–99)

## 2015-09-10 LAB — PROTIME-INR
INR: 1.55 — ABNORMAL HIGH (ref 0.00–1.49)
PROTHROMBIN TIME: 18.6 s — AB (ref 11.6–15.2)

## 2015-09-10 LAB — URINALYSIS, ROUTINE W REFLEX MICROSCOPIC
BILIRUBIN URINE: NEGATIVE
Glucose, UA: 100 mg/dL — AB
Ketones, ur: NEGATIVE mg/dL
NITRITE: NEGATIVE
PROTEIN: NEGATIVE mg/dL
SPECIFIC GRAVITY, URINE: 1.013 (ref 1.005–1.030)
pH: 7 (ref 5.0–8.0)

## 2015-09-10 MED ORDER — POTASSIUM CHLORIDE CRYS ER 20 MEQ PO TBCR
20.0000 meq | EXTENDED_RELEASE_TABLET | Freq: Two times a day (BID) | ORAL | Status: DC
  Administered 2015-09-10 – 2015-09-15 (×11): 20 meq via ORAL
  Filled 2015-09-10 (×13): qty 1

## 2015-09-10 MED ORDER — OXYCODONE HCL 5 MG PO TABS
5.0000 mg | ORAL_TABLET | ORAL | Status: DC | PRN
  Administered 2015-09-11 – 2015-09-14 (×7): 5 mg via ORAL
  Filled 2015-09-10 (×7): qty 1

## 2015-09-10 MED ORDER — WARFARIN SODIUM 5 MG PO TABS
5.0000 mg | ORAL_TABLET | Freq: Once | ORAL | Status: AC
  Administered 2015-09-10: 5 mg via ORAL
  Filled 2015-09-10: qty 1

## 2015-09-10 MED ORDER — BOOST / RESOURCE BREEZE PO LIQD: 1.0000 | Freq: Two times a day (BID) | ORAL | Status: DC | PRN

## 2015-09-10 NOTE — Progress Notes (Signed)
Initial Nutrition Assessment  DOCUMENTATION CODES:   Not applicable  INTERVENTION:  - Will order Boost Breeze BID PRN, each supplement provides 250 kcal and 9 grams of protein - RD will continue to monitor for needs  NUTRITION DIAGNOSIS:   Increased nutrient needs related to catabolic illness, cancer and cancer related treatments as evidenced by estimated needs.  GOAL:   Patient will meet greater than or equal to 90% of their needs  MONITOR:   PO intake, Weight trends, Labs, Skin, I & O's, Supplement acceptance  REASON FOR ASSESSMENT:   Low Braden  ASSESSMENT:   80 y.o. female with a history of Multiple Myeloma (dx 2009) currently on Chemo Rx, HTN, DM2, Hyperlipidemia, Hx DVTs on Coumadin Rx, and PVD who presents to the ED with complaints of increased pain and redness of the left foot worsening over the past 3 days. She had been seen by Vascular Dr. Delana Meyer in [place] on 12/28 and had an aortogram due to ischemia of her middle toes of the left foot. She was evaluated in the ED, and placed on IV Vancomycin and referred for admission.   Pt seen for low Braden score. BMI indicates normal weight status. Pt sleeping at time of RD visit and all information was provided by her 2 daughters who were at bedside. Per chart, pt ate 100% of breakfast this AM. Daughter reports pt ate 100% of [place] toast and fruit cup, several bites of grits with cheese, and she did not eat any of a sausage patty which was on her plate.   Per family, pt had a good appetite overall PTA and even when pt did not feel hungry she would eat knowing that she needed to maintain her strength and weight through chemotherapy treatment. Family had purchased Ensure and Boost as well as Librarian, academic for home use. Pt did not like the creamy consistency of Ensure and Boost and did not particularly care for [facility] but was willing to drink it if she was unable to eat enough at meals. Daughter states that pt  sometimes complained of taste alteration in the form of lack of taste.   Talked with daughter's about alternative ways to increase kcal and protein intakes when appetite is low. Talked about using favorite ice cream to make a milkshake and adding unflavored protein powder to it. One daughter also asks about protein bars; encouraged the use of Luna bars if pt is feeling up to chewing something during times of poor appetite.  Daughter's report recent UBW of 117 lbs and this is confirmed by chart review. Unable to perform physical assessment as pt is sleeping at time of visit.   Likely meeting needs as daughter's state that amount pt ate for breakfast is average or less than she typically eats at each meal. Medications reviewed. Labs reviewed; K: 3.1 mmol/L.   Diet Order:  Diet Heart Room service appropriate?: Yes; Fluid consistency:: Thin  Skin:  Reviewed, no issues  Last BM:  1/8  Height:   Ht Readings from Last 1 Encounters:  09/04/15 '5\' 2"'$  (1.575 m)    Weight:   Wt Readings from Last 1 Encounters:  09/09/15 118 lb 6.2 oz (53.7 kg)    Ideal Body Weight:   50 kg  BMI:  Body mass index is 21.65 kg/(m^2).  Estimated Nutritional Needs:   Kcal:  1600-1850  Protein:  65-75 grams  Fluid:  2 L/day  EDUCATION NEEDS:   No education needs identified at this time  [name], RD, LDN Inpatient Clinical Dietitian Pager # [phone] After hours/weekend pager # [phone]  

## 2015-09-10 NOTE — Progress Notes (Signed)
ANTICOAGULATION CONSULT NOTE  Pharmacy Consult for warfarin Indication: DVT  No Known Allergies  Patient Measurements: Weight: 118 lb 6.2 oz (53.7 kg)  Vital Signs: Temp: 99.9 F (37.7 C) (01/09 0509) Temp Source: Oral (01/09 0509) BP: 139/62 mmHg (01/09 0509) Pulse Rate: 76 (01/09 0509)  Labs:  Recent Labs  09/09/15 1609 09/10/15 0500  HGB 10.7* 9.7*  HCT 32.9* 30.1*  PLT 182 153  LABPROT 19.6* 18.6*  INR 1.66* 1.55*  CREATININE 0.83 0.80    Estimated Creatinine Clearance: 46.6 mL/min (by C-G formula based on Cr of 0.8).   Medical History: Past Medical History  Diagnosis Date  . Blood transfusion   . Depression     Mild  . History of chicken pox   . Hyperlipidemia   . Hypertension   . Degenerative joint disease   . Phlebitis   . Multiple myeloma     per Dr. Julien Nordmann, s/p palliative radiation for leg pain 2011 per Dr. Sondra Come  . Deep vein thrombosis of bilateral lower extremities (HCC)   . Family history of anesthesia complication     DAUGHTER CPR AFTER  . Cardiomyopathy ([place])   . Diabetes mellitus     Steroid related  . UTI (urinary tract infection) 09/08/2013  . History of radiation therapy 08/30/13-09/20/13    20 gray to lower lumbar/upper sacrum    Medications:  PTA warfarin dose: '5mg'$  daily except 2.'5mg'$  on Mon.  Last dose PTA 1/8 am.   Coumadin is managed by Dr Earlie Server.  Daughter (caregiver) states INR flucuates frequently.   Assessment: 80 yo F on chronic Coumadin for hx DVT.  INR sub-therapeutic on admission.   09/10/2015:   INR sub-therapeutic (and trending down)    Drop in Hg and pltc.  No active bleeding reported.   Renal function stable.   Cardiac diet ordered  Goal of Therapy:  INR 2-3    Plan:  Coumadin '5mg'$  po x1 today (to boost INR) Daily INR  Biagio Borg 09/10/2015,8:22 AM

## 2015-09-10 NOTE — Progress Notes (Signed)
ANTICOAGULATION CONSULT NOTE - Initial Consult  Pharmacy Consult for warfarin Indication: DVT  No Known Allergies  Patient Measurements: Weight: 118 lb 6.2 oz (53.7 kg) Heparin Dosing Weight:   Vital Signs: Temp: 99.9 F (37.7 C) (01/09 0509) Temp Source: Oral (01/09 0509) BP: 139/62 mmHg (01/09 0509) Pulse Rate: 76 (01/09 0509)  Labs:  Recent Labs  09/09/15 1609 09/10/15 0500  HGB 10.7* 9.7*  HCT 32.9* 30.1*  PLT 182 153  LABPROT 19.6* 18.6*  INR 1.66* 1.55*  CREATININE 0.83  --     Estimated Creatinine Clearance: 44.9 mL/min (by C-G formula based on Cr of 0.83).   Medical History: Past Medical History  Diagnosis Date  . Blood transfusion   . Depression     Mild  . History of chicken pox   . Hyperlipidemia   . Hypertension   . Degenerative joint disease   . Phlebitis   . Multiple myeloma     per Dr. Julien Nordmann, s/p palliative radiation for leg pain 2011 per Dr. Sondra Come  . Deep vein thrombosis of bilateral lower extremities (HCC)   . Family history of anesthesia complication     DAUGHTER CPR AFTER  . Cardiomyopathy ([place])   . Diabetes mellitus     Steroid related  . UTI (urinary tract infection) 09/08/2013  . History of radiation therapy 08/30/13-09/20/13    20 gray to lower lumbar/upper sacrum    Medications:  Prescriptions prior to admission  Medication Sig Dispense Refill Last Dose  . acyclovir (ZOVIRAX) 400 MG tablet TAKE ONE TABLET BY MOUTH TWICE DAILY 60 tablet 0 09/08/2015 at Unknown time  . ALPRAZolam (XANAX) 0.5 MG tablet TAKE ONE TABLET BY MOUTH TWICE DAILY AS NEEDED FOR ANXIETY (Patient taking differently: TAKE 0.'25mg'$  to 0.'5mg'$  BY MOUTH TWICE DAILY AS NEEDED FOR ANXIETY) 60 tablet 0 09/09/2015 at Unknown time  . Calcium Carbonate-Vitamin D 600-400 MG-UNIT per tablet Take 1 tablet by mouth.    09/09/2015 at Unknown time  . clopidogrel (PLAVIX) 75 MG tablet Take 1 tablet (75 mg total) by mouth daily. 30 tablet 11 09/08/2015 at 1900  . dexamethasone  (DECADRON) 4 MG tablet TAKE 10 TABS BY MOUTH WEEKLY ON DAY 1 WITH CHEMOTHERAPY TREATMENT CARFILZOMIB 40 tablet 0 09/04/2015  . glipiZIDE (GLUCOTROL XL) 2.5 MG 24 hr tablet TAKE ONE TABLET BY MOUTH ONCE DAILY 90 tablet 1 09/09/2015 at Unknown time  . lisinopril-hydrochlorothiazide (PRINZIDE,ZESTORETIC) 20-12.5 MG per tablet Take 1 tablet by mouth daily. 90 tablet 3 09/09/2015 at Unknown time  . loperamide (IMODIUM A-D) 2 MG tablet Take 1 tablet (2 mg total) by mouth 4 (four) times daily as needed for diarrhea or loose stools.   Past Week at Unknown time  . oxyCODONE (OXY IR/ROXICODONE) 5 MG immediate release tablet Take  1-2 tablets ('10mg'$  total) by mouth every 6 hours as needed for severe pain 90 tablet 0 09/09/2015 at Unknown time  . potassium chloride SA (K-DUR,KLOR-CON) 20 MEQ tablet Take 1 tablet (20 mEq total) by mouth once. 10 tablet 0 2 weeks  . prochlorperazine (COMPAZINE) 10 MG tablet Take 1 tablet (10 mg total) by mouth every 6 (six) hours as needed. 30 tablet 0 Past Week at Unknown time  . temazepam (RESTORIL) 30 MG capsule Take 1 capsule (30 mg total) by mouth at bedtime as needed. 30 capsule 0 unknown  . warfarin (COUMADIN) 5 MG tablet TAKE ONE TABLET BY MOUTH ONCE DAILY AS DIRECTED (Patient taking differently: take '5mg'$ s every day except monday take 2.'5mg'$ s) 60  tablet 0 09/08/2015 at 1900   Scheduled:  . acyclovir  400 mg Oral BID  . calcium-vitamin D  1 tablet Oral Daily  . clopidogrel  75 mg Oral Daily  . hydrochlorothiazide  12.5 mg Oral Daily  . insulin aspart  0-5 Units Subcutaneous QHS  . insulin aspart  0-9 Units Subcutaneous TID WC  . lisinopril  20 mg Oral Daily  . vancomycin  500 mg Intravenous Q12H  . Warfarin - Pharmacist Dosing Inpatient   Does not apply q1800    Assessment: Patient with chronic warfarin use.  Prior RPh noted per med rec that last dose of warfarin noted to be on 09/08/15.  However, ~ at midnight RN called to say that family now states that last dose was 09/09/15 PM.   Asked RN to update note to document.  Goal of Therapy:  INR 2-3    Plan:  No warfarin for 1/8 due to prior given dose. Daily INR  Nani Skillern Crowford 09/10/2015,6:04 AM

## 2015-09-10 NOTE — Telephone Encounter (Signed)
Refilled 09-06-2015.

## 2015-09-10 NOTE — Progress Notes (Addendum)
(  late entry) Patient's daughter at bedside and told nurse/ writer that patient was given Coumadin '5mg'$  and Plavix 75 mg today, 09/09/15 prior to arrival to emergency department. Jennet Maduro, NP and pharmacist, Diona Browner, aware.  In -patient ordered Plavix and coumadin dose not given on 09/09/2015.

## 2015-09-10 NOTE — Progress Notes (Signed)
[facility] PROGRESS NOTE  [name] OJJ:[id number] DOB: 1936-01-03 DOA: 09/09/2015 PCP: [name], MD  Assessment/Plan: 80 y/o female with PMH of HTN, DM2, Hyperlipidemia, Hx DVTs on Coumadin Rx, Multiple Myeloma (dx 2009) currently on Chemo Rx, and PVD who recently seen by Vascular Dr. Delana Meyer in [place] on 12/28 and had an aortogram due to ischemia of her middle toes of the left foot who presents to the ED with complaints of increased pain and redness of the left foot worsening over the past 3 days.  1. Cellulitis, foot. Patient is febrile on 1/9. MRI of the toe: no clear osteomyelitis.  -We will cont IV atx, pend cultures  2. PAD. recent aortogram due to ischemia of her middle toes of the left foot by Vascular Dr. Delana Meyer in [place] on 12/28 -Family reports chronic toe discoloration for several month, but worsening gradually. We will ask vascular surgery eval. ? Needs amputation if not healing  3. Hypokalemia. Replace. Monitor. hodl hctz  4. Acute encephalopathy ? Related to infection. Neuro exam is non focal. But confused. We will obtain CT head.  Also check UA 5. Multiple Myeloma is currently undergoing treatment with Carfilzomib, Cytoxan and Decadron status post 12 cycles. 09/18/2015 starting cycle #14  6. DM. Cont ISS. For now 7. Hx DVTs on Coumadin. Cont per pharmacy. needs close monitoring due to  Coumaidn+plavix. Risk of bleeding     Code Status: full Family Communication: d/w patient, her daughters (indicate person spoken with, relationship, and if by phone, the number) Disposition Plan: pend clinical improvement    Consultants:  Vascular   Procedures:  none  Antibiotics:  vanc 1/8>>> (indicate start date, and stop date if known)  HPI/Subjective: Alert, confused   Objective: Filed Vitals:   09/10/15 0300 09/10/15 0509  BP: 165/87 139/62  Pulse: 87 76  Temp: 98.7 F (37.1 C) 99.9 F (37.7 C)  Resp: 18 16    Intake/Output Summary (Last  24 hours) at 09/10/15 1358 Last data filed at 09/10/15 9371  Gross per 24 hour  Intake    240 ml  Output    800 ml  Net   -560 ml   Filed Weights   09/09/15 2110  Weight: 53.7 kg (118 lb 6.2 oz)    Exam:   General:  No distress   Cardiovascular: s1,s2 rrr  Respiratory: CTA BL   Abdomen: soft, nt,nd   Musculoskeletal: R foot 2nd toe discoloration. DP.peripheral Pulses palpable    Data Reviewed: Basic Metabolic Panel:  Recent Labs Lab 09/04/15 1212 09/09/15 1609 09/10/15 0500  NA 140 140 140  K 4.2 3.4* 3.1*  CL  --  101 103  CO2 '22 28 28  '$ GLUCOSE 187* 147* 141*  BUN 13.'7 16 18  '$ CREATININE 0.9 0.83 0.80  CALCIUM 9.9 10.1 9.6   Liver Function Tests:  Recent Labs Lab 09/04/15 1212 09/09/15 1609  AST 16 20  ALT 14 16  ALKPHOS 69 63  BILITOT 0.35 0.7  PROT 6.4 6.3*  ALBUMIN 3.4* 3.5   No results for input(s): LIPASE, AMYLASE in the last 168 hours. No results for input(s): AMMONIA in the last 168 hours. CBC:  Recent Labs Lab 09/04/15 1212 09/09/15 1609 09/10/15 0500  WBC 7.6 9.1 8.4  NEUTROABS 7.3* 8.0*  --   HGB 11.1* 10.7* 9.7*  HCT 34.6* 32.9* 30.1*  MCV 97.6 98.2 98.4  PLT 165 182 153   Cardiac Enzymes: No results for input(s): CKTOTAL, CKMB, CKMBINDEX, TROPONINI in the last  168 hours. BNP (last 3 results) No results for input(s): BNP in the last 8760 hours.  ProBNP (last 3 results) No results for input(s): PROBNP in the last 8760 hours.  CBG:  Recent Labs Lab 09/09/15 2325 09/10/15 0748 09/10/15 1139  GLUCAP 147* 153* 166*    Recent Results (from the past 240 hour(s))  Culture, blood (Routine X 2) w Reflex to ID Panel     Status: None (Preliminary result)   Collection Time: 09/09/15  4:10 PM  Result Value Ref Range Status   Specimen Description BLOOD RIGHT ANTECUBITAL  Final   Special Requests BOTTLES DRAWN AEROBIC AND ANAEROBIC 5CC  Final   Culture PENDING  Incomplete   Report Status PENDING  Incomplete  Culture, blood  (Routine X 2) w Reflex to ID Panel     Status: None (Preliminary result)   Collection Time: 09/09/15  4:25 PM  Result Value Ref Range Status   Specimen Description BLOOD LEFT ANTECUBITAL  Final   Special Requests BOTTLES DRAWN AEROBIC AND ANAEROBIC 5CC  Final   Culture PENDING  Incomplete   Report Status PENDING  Incomplete     Studies: Mr Foot Left Wo Contrast  09/10/2015  CLINICAL DATA:  . History of multiple myeloma. On chemo therapy currently. Increased pain and redness. EXAM: MRI OF THE LEFT FOREFOOT WITHOUT CONTRAST TECHNIQUE: Multiplanar, multisequence MR imaging was performed. No intravenous contrast was administered. COMPARISON:  None. FINDINGS: Patient motion degrades image quality limiting evaluation. There is no acute fracture or dislocation. There is no joint effusion. There is no bone destruction or periosteal reaction. There is mild marrow edema in the second distal phalanx with minimal T1 hypointensity. There is minimal marrow edema in the third distal phalanx without T1 hypointensity. There is no other marrow signal abnormality. The soft tissues are normal. There is no drainable fluid collection or hematoma. The flexor, extensor and peroneal tendons are intact. The muscles are normal. IMPRESSION: 1. Minimal marrow edema in the second and third distal phalanx without definite cortical destruction. The may reflect reactive changes secondary to mild ischemia. No definite evidence of osteomyelitis of this time. Electronically Signed   By: Kathreen Devoid   On: 09/10/2015 08:12   Dg Foot Complete Left  09/09/2015  CLINICAL DATA:  Chronic left foot pain for over 1 years. Redness noted to left foot, accompanied with necrosis to 2nd-4th toes. Pt's daughter states that pt has poor blood flow. Pt currently undergoing chemo for multiple myeloma. EXAM: LEFT FOOT - COMPLETE 3+ VIEW COMPARISON:  None. FINDINGS: Extensive diffuse small vessel calcification. No fracture or dislocation. Postsurgical change  at the ankle. Limited evaluation of first distal phalanx due to persistent flexion at the first interphalangeal joint. As seen best on the lateral view, no acute findings appreciated however. No periosteal reaction or cortical destruction identified. Prominent heel spur. IMPRESSION: No acute abnormalities Electronically Signed   By: Skipper Cliche M.D.   On: 09/09/2015 16:01    Scheduled Meds: . acyclovir  400 mg Oral BID  . calcium-vitamin D  1 tablet Oral Daily  . clopidogrel  75 mg Oral Daily  . hydrochlorothiazide  12.5 mg Oral Daily  . insulin aspart  0-5 Units Subcutaneous QHS  . insulin aspart  0-9 Units Subcutaneous TID WC  . lisinopril  20 mg Oral Daily  . vancomycin  500 mg Intravenous Q12H  . warfarin  5 mg Oral ONCE-1800  . Warfarin - Pharmacist Dosing Inpatient   Does not apply [phone]  Continuous Infusions: . sodium chloride      Principal Problem:   Cellulitis Active Problems:   Multiple myeloma (HCC)   Diabetes mellitus without complication ([place])   Hyperlipidemia   Essential hypertension   Long term current use of anticoagulant therapy   PVD (peripheral vascular disease) ([place])    Time spent: >35 minutes     Kinnie Feil  [facility] Pager [phone]. If 7PM-7AM, please contact night-coverage at [email or web address], password [facility] 09/10/2015, 1:58 PM  LOS: 1 day

## 2015-09-10 NOTE — Progress Notes (Signed)
PT Cancellation Note  Patient Details Name: [name] MRN: [id number] DOB: [birth year]   Cancelled Treatment:    Reason Eval/Treat Not Completed: Patient at procedure or test/unavailable   [facility] 09/10/2015, 3:16 PM

## 2015-09-11 ENCOUNTER — Inpatient Hospital Stay (HOSPITAL_COMMUNITY): Payer: Medicare Other

## 2015-09-11 DIAGNOSIS — I70262 Atherosclerosis of native arteries of extremities with gangrene, left leg: Secondary | ICD-10-CM

## 2015-09-11 LAB — CBC
HEMATOCRIT: 29.9 % — AB (ref 36.0–46.0)
HEMOGLOBIN: 9.5 g/dL — AB (ref 12.0–15.0)
MCH: 31.7 pg (ref 26.0–34.0)
MCHC: 31.8 g/dL (ref 30.0–36.0)
MCV: 99.7 fL (ref 78.0–100.0)
Platelets: 177 10*3/uL (ref 150–400)
RBC: 3 MIL/uL — AB (ref 3.87–5.11)
RDW: 14.9 % (ref 11.5–15.5)
WBC: 6.1 10*3/uL (ref 4.0–10.5)

## 2015-09-11 LAB — HEMOGLOBIN A1C
Hgb A1c MFr Bld: 6.8 % — ABNORMAL HIGH (ref 4.8–5.6)
Mean Plasma Glucose: 148 mg/dL

## 2015-09-11 LAB — BASIC METABOLIC PANEL
ANION GAP: 8 (ref 5–15)
BUN: 15 mg/dL (ref 6–20)
CHLORIDE: 109 mmol/L (ref 101–111)
CO2: 27 mmol/L (ref 22–32)
CREATININE: 0.69 mg/dL (ref 0.44–1.00)
Calcium: 9.3 mg/dL (ref 8.9–10.3)
GFR calc non Af Amer: >60 mL/min (ref >60)
Glucose, Bld: 142 mg/dL — ABNORMAL HIGH (ref 65–99)
Potassium: 3.6 mmol/L (ref 3.5–5.1)
SODIUM: 144 mmol/L (ref 135–145)

## 2015-09-11 LAB — GLUCOSE, CAPILLARY
GLUCOSE-CAPILLARY: 154 mg/dL — AB (ref 65–99)
GLUCOSE-CAPILLARY: 152 mg/dL — AB (ref 65–99)
GLUCOSE-CAPILLARY: 101 mg/dL — AB (ref 65–99)
Glucose-Capillary: 163 mg/dL — ABNORMAL HIGH (ref 65–99)

## 2015-09-11 LAB — PROTIME-INR
INR: 1.51 — ABNORMAL HIGH (ref 0.00–1.49)
Prothrombin Time: 18.3 seconds — ABNORMAL HIGH (ref 11.6–15.2)

## 2015-09-11 MED ORDER — AMLODIPINE BESYLATE 5 MG PO TABS
5.0000 mg | ORAL_TABLET | Freq: Every day | ORAL | Status: DC
  Administered 2015-09-11 – 2015-09-15 (×5): 5 mg via ORAL
  Filled 2015-09-11 (×6): qty 1

## 2015-09-11 MED ORDER — HYDRALAZINE HCL 20 MG/ML IJ SOLN: 10.0000 mg | Freq: Three times a day (TID) | INTRAMUSCULAR | Status: DC | PRN

## 2015-09-11 MED ORDER — WARFARIN SODIUM 7.5 MG PO TABS
7.5000 mg | ORAL_TABLET | Freq: Once | ORAL | Status: AC
  Administered 2015-09-11: 7.5 mg via ORAL
  Filled 2015-09-11: qty 1

## 2015-09-11 NOTE — Consult Note (Signed)
[facility] [place]  Patient name: [name] MRN: [id number] DOB: Sep 06, 1935 Sex: female  REASON FOR CONSULT: Nonhealing wounds left foot.  HPI:  [name] is a 80 y.o. female, who was admitted  With nonhealing wounds of the left foot.  She had dry gangrene of the left second toe and was seen by the vascular surgeons in [facility]. The patient underwent an arteriogram on 08/14/2015. I am unable to view these images but I do have the report which describes no significant disease down through the popliteal artery on the left. However, she had severe tibial artery occlusive disease. The peroneal artery  Was severely diseased and occluded distally. The anterior tibial artery was occluded with no reconstitution distally. The posterior tibial artery had several stenoses which were successfully ballooned. According to the family were no further options for revascularization. She was separately admitted to the hospital here with swelling in the foot and infection and vascular surgery was consulted.  Prior to this admission she denies any significant claudication or rest pain. Her activity is somewhat limited. She does live at home with her son.  Her risk factors for vascular disease include diabetes, hypertension, and hyperlipidemia. She denies any family history of premature cardiovascular disease.She is not a smoker.  Past Medical History  Diagnosis Date  . Blood transfusion   . Depression     Mild  . History of chicken pox   . Hyperlipidemia   . Hypertension   . Degenerative joint disease   . Phlebitis   . Multiple myeloma     per Dr. Julien Nordmann, s/p palliative radiation for leg pain 2011 per Dr. Sondra Come  . Deep vein thrombosis of bilateral lower extremities (HCC)   . Family history of anesthesia complication     DAUGHTER CPR AFTER  . Cardiomyopathy ([place])   . Diabetes mellitus     Steroid related  . UTI (urinary tract infection) 09/08/2013  .  History of radiation therapy 08/30/13-09/20/13    20 gray to lower lumbar/upper sacrum    Family History  Problem Relation Age of Onset  . Diabetes Mother   . Hypertension Mother   . Arthritis Mother   . Stroke Mother   . Diabetes Father   . Hypertension Father   . Arthritis Father   . Stroke Father   . Cancer Sister     stomach  . Stroke Brother   . Cancer Brother     prostate <50  . Stroke Daughter   . Stroke Son   . Arthritis Other     Parents  . Cancer Other     Colon, 1st degree relative <60  . Diabetes Other     Parents  . Stroke Other     1st degree relative <60    SOCIAL HISTORY: Social History   Social History  . Marital Status: Widowed    Spouse Name: N/A  . Number of Children: N/A  . Years of Education: N/A   Occupational History  . Retired    Social History Main Topics  . Smoking status: Former Smoker -- 1.00 packs/day for 13 years    Quit date: 09/01/1973  . Smokeless tobacco: Former Systems developer    Quit date: 04/16/1975     Comment: QIUT MANY YEARS AGO  . Alcohol Use: No  . Drug Use: No  . Sexual Activity: No   Other Topics Concern  . Not on file   Social History Narrative  Regular exercise:  Yes    No Known Allergies  Current Facility-Administered Medications  Medication Dose Route Frequency Provider Last Rate Last Dose  . 0.9 %  sodium chloride infusion   Intravenous Continuous Lacretia Leigh, MD 20 mL/hr at 09/10/15 2000    . acetaminophen (TYLENOL) tablet 650 mg  650 mg Oral Q6H PRN Theressa Millard, MD   650 mg at 09/11/15 1845   Or  . acetaminophen (TYLENOL) suppository 650 mg  650 mg Rectal Q6H PRN Theressa Millard, MD      . acyclovir (ZOVIRAX) tablet 400 mg  400 mg Oral BID Theressa Millard, MD   400 mg at 09/11/15 1044  . alum & mag hydroxide-simeth (MAALOX/MYLANTA) 200-200-20 MG/5ML suspension 30 mL  30 mL Oral Q6H PRN Theressa Millard, MD      . amLODipine (NORVASC) tablet 5 mg  5 mg Oral Daily Kinnie Feil, MD   5 mg  at 09/11/15 1845  . calcium-vitamin D (OSCAL WITH D) 500-200 MG-UNIT per tablet 1 tablet  1 tablet Oral Daily Theressa Millard, MD   1 tablet at 09/11/15 1044  . clopidogrel (PLAVIX) tablet 75 mg  75 mg Oral Daily Theressa Millard, MD   75 mg at 09/11/15 1044  . feeding supplement (BOOST / RESOURCE BREEZE) liquid 1 Container  1 Container Oral BID PRN Maricela Bo Ostheim, RD      . hydrALAZINE (APRESOLINE) injection 10 mg  10 mg Intravenous Q8H PRN Kinnie Feil, MD      . insulin aspart (novoLOG) injection 0-5 Units  0-5 Units Subcutaneous QHS Theressa Millard, MD   0 Units at 09/09/15 2300  . insulin aspart (novoLOG) injection 0-9 Units  0-9 Units Subcutaneous TID WC Theressa Millard, MD   2 Units at 09/11/15 1412  . lisinopril (PRINIVIL,ZESTRIL) tablet 20 mg  20 mg Oral Daily Theressa Millard, MD   20 mg at 09/11/15 1044  . ondansetron (ZOFRAN) tablet 4 mg  4 mg Oral Q6H PRN Theressa Millard, MD       Or  . ondansetron (ZOFRAN) injection 4 mg  4 mg Intravenous Q6H PRN Harvette Evonnie Dawes, MD      . oxyCODONE (Oxy IR/ROXICODONE) immediate release tablet 5 mg  5 mg Oral Q4H PRN Kinnie Feil, MD      . potassium chloride SA (K-DUR,KLOR-CON) CR tablet 20 mEq  20 mEq Oral BID Kinnie Feil, MD   20 mEq at 09/11/15 1044  . temazepam (RESTORIL) capsule 30 mg  30 mg Oral QHS PRN Theressa Millard, MD      . vancomycin (VANCOCIN) 500 mg in sodium chloride 0.9 % 100 mL IVPB  500 mg Intravenous Q12H [name] Muscat Frens, RPH   500 mg at 09/11/15 0830  . Warfarin - Pharmacist Dosing Inpatient   Does not apply q1800 Garnet Sierras, RPH   0  at 09/10/15 1800   Facility-Administered Medications Ordered in Other Encounters  Medication Dose Route Frequency Provider Last Rate Last Dose  . 0.9 %  sodium chloride infusion   Intravenous Once Curt Bears, MD   Stopped at 03/21/15 1720  . 0.9 %  sodium chloride infusion   Intravenous Once Curt Bears, MD      . 0.9 %  sodium chloride infusion    Intravenous Once Curt Bears, MD   Stopped at 07/31/15 1638    REVIEW OF SYSTEMS:  '[X]'$  denotes positive finding, '[ ]'$   denotes negative finding Cardiac  Comments:  Chest pain or chest pressure:    Shortness of breath upon exertion: X   Short of breath when lying flat:    Irregular heart rhythm:        Vascular    Pain in calf, thigh, or hip brought on by ambulation:    Pain in feet at night that wakes you up from your sleep:  X Pain in the ischemic toes on her left foot only.  Blood clot in your veins:    Leg swelling:         Pulmonary    Oxygen at home:    Productive cough:     Wheezing:         Neurologic    Sudden weakness in arms or legs:     Sudden numbness in arms or legs:     Sudden onset of difficulty speaking or slurred speech:    Temporary loss of vision in one eye:     Problems with dizziness:         Gastrointestinal    Blood in stool:     Vomited blood:         Genitourinary    Burning when urinating:     Blood in urine:        Psychiatric    Major depression:         Hematologic    Bleeding problems:    Problems with blood clotting too easily:        Skin    Rashes or ulcers:        Constitutional    Fever or chills:      PHYSICAL EXAM: Filed Vitals:   09/11/15 0539 09/11/15 0626 09/11/15 1418 09/11/15 1521  BP: 176/62 154/62 207/71 193/69  Pulse: 74 66 71 70  Temp: 100.2 F (37.9 C)  99.3 F (37.4 C)   TempSrc: Oral  Oral   Resp: 16  18   Height:      Weight:      SpO2: 98%  100%     GENERAL: The patient is a well-nourished female, in no acute distress. The vital signs are documented above. CARDIAC: There is a regular rate and rhythm.  VASCULAR: I do not detect carotid bruits. On the left side, which is the site of concern, she has a palpable femoral and popliteal pulse. She has a posterior tibial, anterior tibial, and peroneal signal with the Doppler. oon the right side she has a palpable femoral, popliteal, and dorsalis pedis  pulse. PULMONARY: There is good air exchange bilaterally without wheezing or rales. ABDOMEN: Soft and non-tender with normal pitched bowel sounds.  MUSCULOSKELETAL: There are no major deformities. NEUROLOGIC: No focal weakness or paresthesias are detected. SKIN: she has dry gangrene of the left second toe and discoloration also the adjacent third toe. There is a small crack on her left heel. PSYCHIATRIC: The patient has a normal affect.  DATA:  The results of her arteriogram are described above.  MEDICAL ISSUES:  SEVERE TIBIAL ARTERY OCCLUSIVE DISEASE: This patient has undergone an arteriogram  In December in [place]. She had severe tibial artery occlusive disease and did undergo successful angioplasty of disease in her posterior tibial artery. There were no further options for revascularization. She has a brisk posterior tibial signal with the Doppler site think that this is still open and would agree that given her severe tibial artery occlusive disease there are no further options for revascularization.  If the toes do not healed and she should heal a below-the-knee amputation. It might be worth considering toe amputation if the second and third toes progress.  If she can be discharged on po antibiotics the wounds could be followed as an outpatient. However, from a vascular standpoint is nothing further for [place] to offer her she could continue her follow up with the wound care center. I've explained to the patient and her family that we do not operate at [facility] so something is done here they would have to get the orthopedic surgeons involved. I would continue to keep pressure off of the left heel. I would keep dry dressings on the toes on the left foot and soak the foot daily in [place] warm dilute soap soaks to keep the wounds dry. Vascular surgery will be available as needed.  [name] [facility] [place]: [phone]

## 2015-09-11 NOTE — Progress Notes (Signed)
[name] for warfarin Indication: DVT  No Known Allergies  Patient Measurements: Height: 5' (152.4 cm) Weight: 118 lb 6.2 oz (53.7 kg) IBW/kg (Calculated) : 45.5  Vital Signs: Temp: 100.2 F (37.9 C) (01/10 0539) Temp Source: Oral (01/10 0539) BP: 154/62 mmHg (01/10 0626) Pulse Rate: 66 (01/10 0626)  Labs:  Recent Labs  09/09/15 1609 09/10/15 0500 09/11/15 0450  HGB 10.7* 9.7* 9.5*  HCT 32.9* 30.1* 29.9*  PLT 182 153 177  LABPROT 19.6* 18.6* 18.3*  INR 1.66* 1.55* 1.51*  CREATININE 0.83 0.80 0.69    Estimated Creatinine Clearance: 42.3 mL/min (by C-G formula based on Cr of 0.69).   Medical History: Past Medical History  Diagnosis Date  . Blood transfusion   . Depression     Mild  . History of chicken pox   . Hyperlipidemia   . Hypertension   . Degenerative joint disease   . Phlebitis   . Multiple myeloma     per Dr. Julien Nordmann, s/p palliative radiation for leg pain 2011 per Dr. Sondra Come  . Deep vein thrombosis of bilateral lower extremities (HCC)   . Family history of anesthesia complication     DAUGHTER CPR AFTER  . Cardiomyopathy ([place])   . Diabetes mellitus     Steroid related  . UTI (urinary tract infection) 09/08/2013  . History of radiation therapy 08/30/13-09/20/13    20 gray to lower lumbar/upper sacrum    Medications:  PTA warfarin dose: '5mg'$  daily except 2.'5mg'$  on Mon.  Last dose PTA 1/8 am.   Coumadin is managed by Dr Earlie Server.  Daughter (caregiver) states INR flucuates frequently.   Assessment: 80 yo F on chronic Coumadin for hx DVT.  INR sub-therapeutic on admission.   09/11/2015:   INR sub-therapeutic (and trending down)    hgb down slightly, plt relatively stable  No bleeding documented  Drug- drug intxn: IV abx  Renal function stable.   Cardiac diet ordered  Goal of Therapy:  INR 2-3    Plan:  - Coumadin 7.5 mg PO x1 (1.5x home dose) - Daily INR - monitor for s/s bleeding  [name]  P 09/11/2015,8:57 AM

## 2015-09-11 NOTE — Evaluation (Signed)
Physical Therapy Evaluation Patient Details Name: [name] MRN: [id number] DOB: 19-Dec-1935 Today's Date: 09/11/2015   History of Present Illness  Pt admitted with dx cellulitis L foot and hx of DM, L ankle fx and multiple myeloma (currently being treated)  Clinical Impression  Pt admitted as above and presenting with functional mobility limitations 2* R foot pain and balance deficits associated with ltd WB tolerance.  Pt and family plan dc to home with family assist and may benefit from follow up [place] dependent on acute stay progress.    Follow Up Recommendations Home health PT;No PT follow up (Dependent on acute stay progress)    Equipment Recommendations  None recommended by PT    Recommendations for Other Services       Precautions / Restrictions Precautions Precautions: Fall Restrictions Weight Bearing Restrictions: No      Mobility  Bed Mobility Overal bed mobility: Needs Assistance Bed Mobility: Sit to Supine       Sit to supine: Min assist   General bed mobility comments: min assist for LEs  Transfers Overall transfer level: Needs assistance Equipment used: Rolling walker (2 wheeled) Transfers: Sit to/from Stand Sit to Stand: Min assist Stand pivot transfers: Min assist       General transfer comment: cues for use of UEs for saftey.  Balance ltd by pt min WB on L foot  Ambulation/Gait Ambulation/Gait assistance: Min assist Ambulation Distance (Feet): 13 Feet Assistive device: Rolling walker (2 wheeled) Gait Pattern/deviations: Step-to pattern;Decreased step length - right;Decreased step length - left;Shuffle;Trunk flexed Gait velocity: decr   General Gait Details: cues for sequence, posture and position from RW.  Pt with step-to gait and tolerating min wt on heel of L foot only  Stairs            Wheelchair Mobility    Modified Rankin (Stroke Patients Only)       Balance                                              Pertinent Vitals/Pain Pain Assessment: 0-10 Pain Score: 6  Pain Location: L foot Pain Descriptors / Indicators: Aching;Sore Pain Intervention(s): Limited activity within patient's tolerance;Monitored during session    [place] expects to be discharged to:: Private residence Living Arrangements: Children Available Help at Discharge: Family Type of Home: House Home Access: [place]: [place]: Environmental consultant - 2 wheels      Prior Function Level of Independence: Needs assistance   Gait / Transfers Assistance Needed: Ltd ambulation with RW 2* foot pain  ADL's / Homemaking Assistance Needed: Assisted by children        Hand Dominance        Extremity/Trunk Assessment   Upper Extremity Assessment: Overall WFL for tasks assessed           Lower Extremity Assessment: LLE deficits/detail   LLE Deficits / Details: Strength/ROM WFL with exception of foot 2* pain     Communication   Communication: No difficulties  Cognition Arousal/Alertness: Awake/alert Behavior During Therapy: WFL for tasks assessed/performed Overall Cognitive Status: Within Functional Limits for tasks assessed                      General Comments      Exercises  Assessment/Plan    PT Assessment Patient needs continued PT services  PT Diagnosis Difficulty walking   PT Problem List Decreased activity tolerance;Decreased balance;Decreased mobility;Decreased knowledge of use of DME;Pain  PT Treatment Interventions DME instruction;Gait training;Functional mobility training;Therapeutic activities;Therapeutic exercise;Balance training;Patient/family education   PT Goals (Current goals can be found in the Care Plan section) Acute Rehab PT Goals Patient Stated Goal: Walk with less pain PT Goal Formulation: With patient Time For Goal Achievement: 09/25/15 Potential to Achieve Goals: Fair    Frequency Min 3X/week   Barriers  to discharge        Co-evaluation               End of Session Equipment Utilized During Treatment: Gait belt Activity Tolerance: Patient limited by pain Patient left: in bed;with call bell/phone within reach Nurse Communication: Mobility status         Time: 0034-9179 PT Time Calculation (min) (ACUTE ONLY): 23 min   Charges:   PT Evaluation $PT Eval Low Complexity: 1 Procedure PT Treatments $Gait Training: 8-22 mins   PT G Codes:        Dorin Stooksbury 09-19-2015, 1:04 PM

## 2015-09-11 NOTE — Consult Note (Signed)
   [facility] CM Inpatient Consult   09/11/2015  DEJUANA WEIST 1935/12/04 [id number]   EPIC referral for [facility] services. Spoke with inpatient RNCM. Will not engage for [facility] at this time.   Marthenia Rolling, MSN-Ed, RN,BSN [facility] Liaison [phone]

## 2015-09-11 NOTE — Progress Notes (Signed)
[facility] PROGRESS NOTE  [name] LOV:[id number] DOB: May 31, 1936 DOA: 09/09/2015 PCP: [name], MD  Assessment/Plan: 80 y/o female with PMH of HTN, DM2, Hyperlipidemia, Hx DVTs on Coumadin Rx, Multiple Myeloma (dx 2009) currently on Chemo Rx, and PVD who recently seen by Vascular Dr. Delana Meyer in [place] on 12/28 and had an aortogram due to ischemia of her middle toes of the left foot who presents to the ED with complaints of increased pain and redness of the left foot worsening over the past 3 days.  1. Cellulitis, foot. Patient is febrile on 1/9. MRI of the toe: no clear osteomyelitis.  -We will cont IV atx, cultures are still pend. Pt also has few rales on exam. will obtain CXR.   2. PAD. recent aortogram due to ischemia of her middle toes of the left foot by Vascular Dr. Delana Meyer in [place] on 12/28 -Family reports chronic toe discoloration for several month, but worsening gradually. Consulted  vascular surgery eval. ? Needs amputation if not healing  3. Hypokalemia. Replace. Monitor. hold hctz  4. Acute encephalopathy ? Related to infection. Neuro exam is non focal. But confused. CT head: no acute infarcts. Mental status is stable  5. Multiple Myeloma is currently undergoing treatment with Carfilzomib, Cytoxan and Decadron status post 12 cycles. 09/18/2015 starting cycle #14  6. DM. Cont ISS. For now 7. Hx DVTs on Coumadin. Cont per pharmacy. needs close monitoring due to  Coumaidn+plavix. Risk of bleeding   D/w patient, her sons  Code Status: full Family Communication: d/w patient, her daughters (indicate person spoken with, relationship, and if by phone, the number) Disposition Plan: pend clinical improvement    Consultants:  Vascular   Procedures:  none  Antibiotics:  vanc 1/8>>> (indicate start date, and stop date if known)  HPI/Subjective: Alert, confused   Objective: Filed Vitals:   09/11/15 0539 09/11/15 0626  BP: 176/62 154/62  Pulse: 74  66  Temp: 100.2 F (37.9 C)   Resp: 16     Intake/Output Summary (Last 24 hours) at 09/11/15 1146 Last data filed at 09/11/15 0830  Gross per 24 hour  Intake   1880 ml  Output    200 ml  Net   1680 ml   Filed Weights   09/09/15 2110  Weight: 53.7 kg (118 lb 6.2 oz)    Exam:   General:  No distress   Cardiovascular: s1,s2 rrr  Respiratory: CTA BL   Abdomen: soft, nt,nd   Musculoskeletal: R foot 2nd toe discoloration. DP.peripheral Pulses palpable    Data Reviewed: Basic Metabolic Panel:  Recent Labs Lab 09/04/15 1212 09/09/15 1609 09/10/15 0500 09/11/15 0450  NA 140 140 140 144  K 4.2 3.4* 3.1* 3.6  CL  --  101 103 109  CO2 '22 28 28 27  '$ GLUCOSE 187* 147* 141* 142*  BUN 13.'7 16 18 15  '$ CREATININE 0.9 0.83 0.80 0.69  CALCIUM 9.9 10.1 9.6 9.3   Liver Function Tests:  Recent Labs Lab 09/04/15 1212 09/09/15 1609  AST 16 20  ALT 14 16  ALKPHOS 69 63  BILITOT 0.35 0.7  PROT 6.4 6.3*  ALBUMIN 3.4* 3.5   No results for input(s): LIPASE, AMYLASE in the last 168 hours. No results for input(s): AMMONIA in the last 168 hours. CBC:  Recent Labs Lab 09/04/15 1212 09/09/15 1609 09/10/15 0500 09/11/15 0450  WBC 7.6 9.1 8.4 6.1  NEUTROABS 7.3* 8.0*  --   --   HGB 11.1* 10.7* 9.7* 9.5*  HCT 34.6* 32.9* 30.1* 29.9*  MCV 97.6 98.2 98.4 99.7  PLT 165 182 153 177   Cardiac Enzymes: No results for input(s): CKTOTAL, CKMB, CKMBINDEX, TROPONINI in the last 168 hours. BNP (last 3 results) No results for input(s): BNP in the last 8760 hours.  ProBNP (last 3 results) No results for input(s): PROBNP in the last 8760 hours.  CBG:  Recent Labs Lab 09/10/15 0748 09/10/15 1139 09/10/15 1711 09/10/15 2105 09/11/15 0738  GLUCAP 153* 166* 165* 114* 154*    Recent Results (from the past 240 hour(s))  Culture, blood (Routine X 2) w Reflex to ID Panel     Status: None (Preliminary result)   Collection Time: 09/09/15  4:10 PM  Result Value Ref Range Status    Specimen Description BLOOD RIGHT ANTECUBITAL  Final   Special Requests BOTTLES DRAWN AEROBIC AND ANAEROBIC 5CC  Final   Culture PENDING  Incomplete   Report Status PENDING  Incomplete  Culture, blood (Routine X 2) w Reflex to ID Panel     Status: None (Preliminary result)   Collection Time: 09/09/15  4:25 PM  Result Value Ref Range Status   Specimen Description BLOOD LEFT ANTECUBITAL  Final   Special Requests BOTTLES DRAWN AEROBIC AND ANAEROBIC 5CC  Final   Culture PENDING  Incomplete   Report Status PENDING  Incomplete     Studies: Ct Head Wo Contrast  09/10/2015  CLINICAL DATA:  Confusion.  History of multiple myeloma EXAM: CT HEAD WITHOUT CONTRAST TECHNIQUE: Contiguous axial images were obtained from the base of the skull through the vertex without intravenous contrast. COMPARISON:  March 10, 2014 FINDINGS: There is mild diffuse atrophy. There is no intracranial mass, hemorrhage, extra-axial fluid collection, or midline shift. There is mild small vessel disease in the centra semiovale bilaterally. There is no new gray-white compartment lesion. No acute infarct evident. There are multiple small lytic lesions in calvarium consistent with multiple myeloma. No fractures are identified. The mastoid air cells are clear. No intraorbital lesions are identified. IMPRESSION: Bony changes in the calvarium consistent with multiple myeloma. There is no apparent progression of disease since most recent prior study. There is atrophy with periventricular small vessel disease, stable. No intra-axial mass, hemorrhage, or extra-axial fluid collection. No acute infarct evident. Electronically Signed   By: Lowella Grip III M.D.   On: 09/10/2015 15:03   Mr Foot Left Wo Contrast  09/10/2015  CLINICAL DATA:  . History of multiple myeloma. On chemo therapy currently. Increased pain and redness. EXAM: MRI OF THE LEFT FOREFOOT WITHOUT CONTRAST TECHNIQUE: Multiplanar, multisequence MR imaging was performed. No  intravenous contrast was administered. COMPARISON:  None. FINDINGS: Patient motion degrades image quality limiting evaluation. There is no acute fracture or dislocation. There is no joint effusion. There is no bone destruction or periosteal reaction. There is mild marrow edema in the second distal phalanx with minimal T1 hypointensity. There is minimal marrow edema in the third distal phalanx without T1 hypointensity. There is no other marrow signal abnormality. The soft tissues are normal. There is no drainable fluid collection or hematoma. The flexor, extensor and peroneal tendons are intact. The muscles are normal. IMPRESSION: 1. Minimal marrow edema in the second and third distal phalanx without definite cortical destruction. The may reflect reactive changes secondary to mild ischemia. No definite evidence of osteomyelitis of this time. Electronically Signed   By: Kathreen Devoid   On: 09/10/2015 08:12   Dg Foot Complete Left  09/09/2015  CLINICAL DATA:  Chronic left foot pain for over 1 years. Redness noted to left foot, accompanied with necrosis to 2nd-4th toes. Pt's daughter states that pt has poor blood flow. Pt currently undergoing chemo for multiple myeloma. EXAM: LEFT FOOT - COMPLETE 3+ VIEW COMPARISON:  None. FINDINGS: Extensive diffuse small vessel calcification. No fracture or dislocation. Postsurgical change at the ankle. Limited evaluation of first distal phalanx due to persistent flexion at the first interphalangeal joint. As seen best on the lateral view, no acute findings appreciated however. No periosteal reaction or cortical destruction identified. Prominent heel spur. IMPRESSION: No acute abnormalities Electronically Signed   By: Skipper Cliche M.D.   On: 09/09/2015 16:01    Scheduled Meds: . acyclovir  400 mg Oral BID  . calcium-vitamin D  1 tablet Oral Daily  . clopidogrel  75 mg Oral Daily  . insulin aspart  0-5 Units Subcutaneous QHS  . insulin aspart  0-9 Units Subcutaneous TID WC   . lisinopril  20 mg Oral Daily  . potassium chloride  20 mEq Oral BID  . vancomycin  500 mg Intravenous Q12H  . warfarin  7.5 mg Oral ONCE-1800  . Warfarin - Pharmacist Dosing Inpatient   Does not apply q1800   Continuous Infusions: . sodium chloride 20 mL/hr at 09/10/15 2000    Principal Problem:   Cellulitis Active Problems:   Multiple myeloma ([place])   Diabetes mellitus without complication ([place])   Hyperlipidemia   Essential hypertension   Long term current use of anticoagulant therapy   PVD (peripheral vascular disease) ([place])    Time spent: >35 minutes     Kinnie Feil  [facility] Pager [phone]. If 7PM-7AM, please contact night-coverage at [email or web address], password [facility] 09/11/2015, 11:46 AM  LOS: 2 days

## 2015-09-11 NOTE — Progress Notes (Signed)
RN states Pt unable to engage in decision for [facility] or [facility] services at this present time.

## 2015-09-12 ENCOUNTER — Encounter: Payer: Medicare Other | Admitting: Vascular Surgery

## 2015-09-12 ENCOUNTER — Other Ambulatory Visit: Payer: Medicare Other

## 2015-09-12 ENCOUNTER — Telehealth: Payer: Self-pay | Admitting: Family Medicine

## 2015-09-12 LAB — CBC
HEMATOCRIT: 29.6 % — AB (ref 36.0–46.0)
HEMOGLOBIN: 9.2 g/dL — AB (ref 12.0–15.0)
MCH: 30.5 pg (ref 26.0–34.0)
MCHC: 31.1 g/dL (ref 30.0–36.0)
MCV: 98 fL (ref 78.0–100.0)
Platelets: 209 10*3/uL (ref 150–400)
RBC: 3.02 MIL/uL — ABNORMAL LOW (ref 3.87–5.11)
RDW: 14.6 % (ref 11.5–15.5)
WBC: 5.8 10*3/uL (ref 4.0–10.5)

## 2015-09-12 LAB — GLUCOSE, CAPILLARY
GLUCOSE-CAPILLARY: 144 mg/dL — AB (ref 65–99)
GLUCOSE-CAPILLARY: 145 mg/dL — AB (ref 65–99)
Glucose-Capillary: 136 mg/dL — ABNORMAL HIGH (ref 65–99)
Glucose-Capillary: 140 mg/dL — ABNORMAL HIGH (ref 65–99)

## 2015-09-12 LAB — VANCOMYCIN, TROUGH: VANCOMYCIN TR: 14 ug/mL (ref 10.0–20.0)

## 2015-09-12 LAB — PROTIME-INR
INR: 1.8 — AB (ref 0.00–1.49)
Prothrombin Time: 20.8 seconds — ABNORMAL HIGH (ref 11.6–15.2)

## 2015-09-12 MED ORDER — WARFARIN SODIUM 5 MG PO TABS
5.0000 mg | ORAL_TABLET | Freq: Once | ORAL | Status: AC
  Administered 2015-09-12: 5 mg via ORAL
  Filled 2015-09-12: qty 1

## 2015-09-12 NOTE — Care Management Note (Addendum)
Case Management Note  Patient Details  Name: [name] MRN: [id number] Date of Birth: [birth year]  Subjective/Objective:  80 y.o. F who was admitted 09/09/2015 for Gangrenous L Foot. Lives with son who is w/c bound, yet cares for her by cooking, etc...  6 other Adult children who have visited frequently while Mette has been in the hospital and all share responsibility of care.  Vascular Consult reveals will need Wound Care which can be done at [facility] or via [facility]. Will arrange at discretion of MD.  Spoke with Yolande Jolly, [phone] who reports she handles majority of Healthcare decisions. All children were present for Vascular MD visit 1/10/2017and are in agreement with HHPT and Wound Care. Family has used [facility] in past and has chosen to use them again.                  Action/Plan: CM made Edwinna Areola aware of referral for HHPT and possibility of need for [facility] for Wound care. CM will continue to follow for final disposition.   Expected Discharge Date:  09/17/15               Expected Discharge Plan:  Home/Self Care  In-House Referral:     Discharge planning Services  CM Consult  Post Acute Care Choice:    Choice offered to:  Adult Children (Lives with Adult/W/C bound Son who cares for her)  DME Arranged:    DME Agency:     HH Arranged:    [place] Agency:     Status of Service:  Completed, signed off  Medicare Important Message Given:  Yes Date Medicare IM Given:    Medicare IM give by:    Date Additional Medicare IM Given:    Additional Medicare Important Message give by:     If discussed at [place] of Stay Meetings, dates discussed:    Additional Comments:  Delrae Sawyers, RN 09/12/2015, 3:41 PM

## 2015-09-12 NOTE — Care Management Important Message (Signed)
Important Message  Patient Details  Name: [name] MRN: [id number] Date of Birth: [birth year]   Medicare Important Message Given:  Yes    Camillo Flaming 09/12/2015, 10:50 AMImportant Message  Patient Details  Name: [name] MRN: [id number] Date of Birth: [birth year]   Medicare Important Message Given:  Yes    Camillo Flaming 09/12/2015, 10:49 AM

## 2015-09-12 NOTE — Progress Notes (Signed)
[facility] PROGRESS NOTE  [name] ZSM:[id number] DOB: Jan 24, 1936 DOA: 09/09/2015 PCP: [name], MD  Assessment/Plan: 80 y/o female with PMH of HTN, DM2, Hyperlipidemia, Hx DVTs on Coumadin Rx, Multiple Myeloma (dx 2009) currently on Chemo Rx, and PVD who recently seen by Vascular Dr. Delana Meyer in [place] on 12/28 and had an aortogram due to ischemia of her middle toes of the left foot who presents to the ED with complaints of increased pain and redness of the left foot worsening over the past 3 days.  1. Cellulitis, foot.  - Patient is febrile on 1/9. MRI of the toe: no clear osteomyelitis.  - Continue with IV antibiotics given patient still have low-grade temperature at this 100.1 - Hopefully can discharge on by mouth antibiotics in a.m. if remains afebrile   2. PAD.  recent aortogram due to ischemia of her middle toes of the left foot by Vascular Dr. Delana Meyer in [place] on 12/28 -Family reports chronic toe discoloration for several month, but worsening gradually.  - Vascular surgery consult greatly appreciated, patient following with vascular surgery at [facility], already has been placement in December 2017, discussed with Dr.Dickson, there is no further revascularization option at this point. - The plan is to observe patient on by mouth antibiotics, and if no improvement, then would consider either to amputation or below knee amputation, and this can be determined as an outpatient either with along with her primary vascular surgery, or with Dr. Scot Dock if she chooses to.    3. Hypokalemia.  - Repleted, monitor closely  4. Acute encephalopathy  - Related to infection. Neuro exam is non focal. But confused. CT head: no acute infarcts. Mental status is stable   5. Multiple Myeloma is currently undergoing treatment with Carfilzomib, Cytoxan and Decadron status post 12 cycles. 09/18/2015 starting cycle #14   6. DM - Cont ISS. For now  7. Hx DVTs  - on Coumadin.  Cont per pharmacy. needs close monitoring due to  Coumaidn+plavix. Risk of bleeding   D/w patient, her sons  Code Status: full Family Communication: d/w patient,  Disposition Plan: possible D/C in am if afebrile.   Consultants:  Vascular   Procedures:  none  Antibiotics:  vanc 1/8>>> (indicate start date, and stop date if known)  HPI/Subjective: Alert, confused   Objective: Filed Vitals:   09/12/15 0632 09/12/15 1449  BP: 155/54 185/73  Pulse: 87 68  Temp: 99.4 F (37.4 C) 99.6 F (37.6 C)  Resp: 16 16    Intake/Output Summary (Last 24 hours) at 09/12/15 1613 Last data filed at 09/12/15 1440  Gross per 24 hour  Intake   1700 ml  Output      0 ml  Net   1700 ml   Filed Weights   09/09/15 2110  Weight: 53.7 kg (118 lb 6.2 oz)    Exam:   General:  No distress   Cardiovascular: s1,s2 rrr  Respiratory: CTA BL   Abdomen: soft, nt,nd   Musculoskeletal: R foot 2nd toe discoloration. DP.peripheral Pulses palpable    Data Reviewed: Basic Metabolic Panel:  Recent Labs Lab 09/09/15 1609 09/10/15 0500 09/11/15 0450  NA 140 140 144  K 3.4* 3.1* 3.6  CL 101 103 109  CO2 '28 28 27  '$ GLUCOSE 147* 141* 142*  BUN '16 18 15  '$ CREATININE 0.83 0.80 0.69  CALCIUM 10.1 9.6 9.3   Liver Function Tests:  Recent Labs Lab 09/09/15 1609  AST 20  ALT 16  ALKPHOS 63  BILITOT 0.7  PROT 6.3*  ALBUMIN 3.5   No results for input(s): LIPASE, AMYLASE in the last 168 hours. No results for input(s): AMMONIA in the last 168 hours. CBC:  Recent Labs Lab 09/09/15 1609 09/10/15 0500 09/11/15 0450 09/12/15 0433  WBC 9.1 8.4 6.1 5.8  NEUTROABS 8.0*  --   --   --   HGB 10.7* 9.7* 9.5* 9.2*  HCT 32.9* 30.1* 29.9* 29.6*  MCV 98.2 98.4 99.7 98.0  PLT 182 153 177 209   Cardiac Enzymes: No results for input(s): CKTOTAL, CKMB, CKMBINDEX, TROPONINI in the last 168 hours. BNP (last 3 results) No results for input(s): BNP in the last 8760 hours.  ProBNP (last 3  results) No results for input(s): PROBNP in the last 8760 hours.  CBG:  Recent Labs Lab 09/11/15 1304 09/11/15 1730 09/11/15 2155 09/12/15 0755 09/12/15 1129  GLUCAP 152* 101* 163* 136* 144*    Recent Results (from the past 240 hour(s))  Culture, blood (Routine X 2) w Reflex to ID Panel     Status: None (Preliminary result)   Collection Time: 09/09/15  4:10 PM  Result Value Ref Range Status   Specimen Description BLOOD RIGHT ANTECUBITAL  Final   Special Requests BOTTLES DRAWN AEROBIC AND ANAEROBIC 5CC  Final   Culture PENDING  Incomplete   Report Status PENDING  Incomplete  Culture, blood (Routine X 2) w Reflex to ID Panel     Status: None (Preliminary result)   Collection Time: 09/09/15  4:25 PM  Result Value Ref Range Status   Specimen Description BLOOD LEFT ANTECUBITAL  Final   Special Requests BOTTLES DRAWN AEROBIC AND ANAEROBIC 5CC  Final   Culture   Final    NO GROWTH 2 DAYS Performed at [facility]    Report Status PENDING  Incomplete     Studies: Dg Chest 2 View  09/11/2015  CLINICAL DATA:  Cough. EXAM: CHEST - 2 VIEW COMPARISON:  09/07/2014 and other prior chest radiographs including lateral chest x-ray on 09/08/2013. FINDINGS: The heart size and mediastinal contours are within normal limits. Mild bibasilar atelectasis present. There is no evidence of pulmonary edema, consolidation, pneumothorax, nodule or pleural fluid. Mid thoracic compression deformity appears to have been present previously. IMPRESSION: Bibasilar atelectasis.  No active disease. Electronically Signed   By: Aletta Edouard M.D.   On: 09/11/2015 14:45    Scheduled Meds: . acyclovir  400 mg Oral BID  . amLODipine  5 mg Oral Daily  . calcium-vitamin D  1 tablet Oral Daily  . clopidogrel  75 mg Oral Daily  . insulin aspart  0-5 Units Subcutaneous QHS  . insulin aspart  0-9 Units Subcutaneous TID WC  . lisinopril  20 mg Oral Daily  . potassium chloride  20 mEq Oral BID  . vancomycin   500 mg Intravenous Q12H  . warfarin  5 mg Oral ONCE-1800  . Warfarin - Pharmacist Dosing Inpatient   Does not apply q1800   Continuous Infusions: . sodium chloride 20 mL/hr at 09/10/15 2000    Principal Problem:   Cellulitis Active Problems:   Multiple myeloma ([place])   Diabetes mellitus without complication ([place])   Hyperlipidemia   Essential hypertension   Long term current use of anticoagulant therapy   PVD (peripheral vascular disease) ([place])    Time spent: 30 minutes     Dois Juarbe  [facility] Pager [phone]. If 7PM-7AM, please contact night-coverage at [email or web address], password [facility] 09/12/2015, 4:13 PM  LOS: 3  days

## 2015-09-12 NOTE — Progress Notes (Signed)
ANTIBIOTIC CONSULT NOTE - follow up  Pharmacy Consult for Vancomycin Indication: nonhealing foot wounds  No Known Allergies  Patient Measurements: Height: 62 inches TBW: 52 kg   Vital Signs: Temp: 99.4 F (37.4 C) (01/11 0632) Temp Source: Oral (01/11 2542) BP: 155/54 mmHg (01/11 7062) Pulse Rate: 87 (01/11 0632) Intake/Output from previous day: 01/10 0701 - 01/11 0700 In: 960 [P.O.:660; IV Piggyback:300] Out: -  Intake/Output from this shift:    Labs:  Recent Labs  09/09/15 1609 09/10/15 0500 09/11/15 0450 09/12/15 0433  WBC 9.1 8.4 6.1 5.8  HGB 10.7* 9.7* 9.5* 9.2*  PLT 182 153 177 209  CREATININE 0.83 0.80 0.69  --    Estimated Creatinine Clearance: 42.3 mL/min (by C-G formula based on Cr of 0.69). No results for input(s): VANCOTROUGH, VANCOPEAK, VANCORANDOM, GENTTROUGH, GENTPEAK, GENTRANDOM, TOBRATROUGH, TOBRAPEAK, TOBRARND, AMIKACINPEAK, AMIKACINTROU, AMIKACIN in the last 72 hours.   Microbiology: Recent Results (from the past 720 hour(s))  Culture, blood (Routine X 2) w Reflex to ID Panel     Status: None (Preliminary result)   Collection Time: 09/09/15  4:10 PM  Result Value Ref Range Status   Specimen Description BLOOD RIGHT ANTECUBITAL  Final   Special Requests BOTTLES DRAWN AEROBIC AND ANAEROBIC 5CC  Final   Culture PENDING  Incomplete   Report Status PENDING  Incomplete  Culture, blood (Routine X 2) w Reflex to ID Panel     Status: None (Preliminary result)   Collection Time: 09/09/15  4:25 PM  Result Value Ref Range Status   Specimen Description BLOOD LEFT ANTECUBITAL  Final   Special Requests BOTTLES DRAWN AEROBIC AND ANAEROBIC 5CC  Final   Culture   Final    NO GROWTH 1 DAY Performed at [facility]    Report Status PENDING  Incomplete    Medical History: Past Medical History  Diagnosis Date  . Blood transfusion   . Depression     Mild  . History of chicken pox   . Hyperlipidemia   . Hypertension   . Degenerative joint  disease   . Phlebitis   . Multiple myeloma     per Dr. Julien Nordmann, s/p palliative radiation for leg pain 2011 per Dr. Sondra Come  . Deep vein thrombosis of bilateral lower extremities ([facility])   . Family history of anesthesia complication     DAUGHTER CPR AFTER  . Cardiomyopathy ([place])   . Diabetes mellitus     Steroid related  . UTI (urinary tract infection) 09/08/2013  . History of radiation therapy 08/30/13-09/20/13    20 gray to lower lumbar/upper sacrum    Medications:  Scheduled:  . acyclovir  400 mg Oral BID  . amLODipine  5 mg Oral Daily  . calcium-vitamin D  1 tablet Oral Daily  . clopidogrel  75 mg Oral Daily  . insulin aspart  0-5 Units Subcutaneous QHS  . insulin aspart  0-9 Units Subcutaneous TID WC  . lisinopril  20 mg Oral Daily  . potassium chloride  20 mEq Oral BID  . vancomycin  500 mg Intravenous Q12H  . warfarin  5 mg Oral ONCE-1800  . Warfarin - Pharmacist Dosing Inpatient   Does not apply q1800   Infusions:  . sodium chloride 20 mL/hr at 09/10/15 2000   PRN:   Assessment: 79yoF with h/o multiple myeloma and currently on chemo therapy, HTN, DM2, hyperlipidemia and on Coumadin for h/o DVT presenting to the ER with increased pain and redness of the left foot that is  worsening for the past 3 days. Vancomycin to be started for osteomyelitis. Vancomycin 1gm IV x1 given in the ER  1/8 >> Vancomycin >>     1/8 bcx x2: ngtd  Goal of Therapy:  Vancomycin trough level 15-20 mcg/ml  Eradication of infection Appropriate antibiotic dosing for indication and renal function.  Plan:  1) Noted plans per vascular surgery note and possibility of changing to PO abxs soon with outpatient follow up 2) Continue vancomycin '500mg'$  IV q12 for now 3) Will check vanc trough tonight at 1900 prior to 8pm dose (prior to 7th dose)   Adrian Saran, PharmD, BCPS Pager [phone] 09/12/2015 9:50 AM

## 2015-09-12 NOTE — Progress Notes (Signed)
ANTICOAGULATION CONSULT NOTE  Pharmacy Consult for warfarin Indication: DVT  No Known Allergies  Patient Measurements: Height: 5' (152.4 cm) Weight: 118 lb 6.2 oz (53.7 kg) IBW/kg (Calculated) : 45.5  Vital Signs: Temp: 99.4 F (37.4 C) (01/11 0632) Temp Source: Oral (01/11 1324) BP: 155/54 mmHg (01/11 4010) Pulse Rate: 87 (01/11 0632)  Labs:  Recent Labs  09/09/15 1609 09/10/15 0500 09/11/15 0450 09/12/15 0433  HGB 10.7* 9.7* 9.5* 9.2*  HCT 32.9* 30.1* 29.9* 29.6*  PLT 182 153 177 209  LABPROT 19.6* 18.6* 18.3* 20.8*  INR 1.66* 1.55* 1.51* 1.80*  CREATININE 0.83 0.80 0.69  --     Estimated Creatinine Clearance: 42.3 mL/min (by C-G formula based on Cr of 0.69).   Medical History: Past Medical History  Diagnosis Date  . Blood transfusion   . Depression     Mild  . History of chicken pox   . Hyperlipidemia   . Hypertension   . Degenerative joint disease   . Phlebitis   . Multiple myeloma     per Dr. Julien Nordmann, s/p palliative radiation for leg pain 2011 per Dr. Sondra Come  . Deep vein thrombosis of bilateral lower extremities ([facility])   . Family history of anesthesia complication     DAUGHTER CPR AFTER  . Cardiomyopathy ([place])   . Diabetes mellitus     Steroid related  . UTI (urinary tract infection) 09/08/2013  . History of radiation therapy 08/30/13-09/20/13    20 gray to lower lumbar/upper sacrum    Medications:  PTA warfarin dose: 86m daily except 2.510mon Mon.  Last dose PTA 1/8 am.   Coumadin is managed by Dr MoEarlie Server Daughter (caregiver) states INR flucuates frequently.   Assessment: [age]o F on chronic Coumadin for hx DVT.  INR sub-therapeutic on admission.   09/12/2015:   INR sub-therapeutic but now responding after doses of 67m72mnd 7.67mg87m inpatient  CBC stable  No bleeding documented  Cardiac diet ordered - not eating however  Goal of Therapy:  INR 2-3   Plan:  1) Warfarin 67mg 32ms evening 2) Daily INR   JustiAdrian SaranrmD,  BCPS Pager [phone] 1307/2017 9:43 AM

## 2015-09-12 NOTE — Progress Notes (Signed)
Pharmacy Antibiotic Follow-up Note  [name] is a 80 y.o. year-old female admitted on 09/09/2015.  The patient is currently on day 4 of Vancomycin '500mg'$  q12 for osteomyelitis.  Assessment/Plan: This patient's current antibiotics will be continued without adjustments.  Temp (24hrs), Avg:99.8 F (37.7 C), Min:99.4 F (37.4 C), Max:100.1 F (37.8 C)   Recent Labs Lab 09/09/15 1609 09/10/15 0500 09/11/15 0450 09/12/15 0433  WBC 9.1 8.4 6.1 5.8    Recent Labs Lab 09/09/15 1609 09/10/15 0500 09/11/15 0450  CREATININE 0.83 0.80 0.69   Estimated Creatinine Clearance: 42.3 mL/min (by C-G formula based on Cr of 0.69).    No Known Allergies  Antimicrobials this admission: 1/8 Vancomycin  >>   Levels/dose changes this admission: 1/11 Vanc trough at 18:30 = 14 mcg/ml  Microbiology results: 1/8 BCx: ngtd  Thank you for allowing pharmacy to be a part of this patient's care.  Minda Ditto PharmD 09/12/2015 7:35 PM

## 2015-09-12 NOTE — Telephone Encounter (Signed)
Pt is in [facility] since Sunday morning  cb number [phone]

## 2015-09-13 DIAGNOSIS — I96 Gangrene, not elsewhere classified: Secondary | ICD-10-CM | POA: Diagnosis present

## 2015-09-13 DIAGNOSIS — I7389 Other specified peripheral vascular diseases: Secondary | ICD-10-CM

## 2015-09-13 LAB — GLUCOSE, CAPILLARY
GLUCOSE-CAPILLARY: 193 mg/dL — AB (ref 65–99)
GLUCOSE-CAPILLARY: 136 mg/dL — AB (ref 65–99)
GLUCOSE-CAPILLARY: 120 mg/dL — AB (ref 65–99)
Glucose-Capillary: 147 mg/dL — ABNORMAL HIGH (ref 65–99)

## 2015-09-13 LAB — PROTIME-INR
INR: 2.15 — ABNORMAL HIGH (ref 0.00–1.49)
PROTHROMBIN TIME: 23.9 s — AB (ref 11.6–15.2)

## 2015-09-13 MED ORDER — WARFARIN SODIUM 5 MG PO TABS
5.0000 mg | ORAL_TABLET | Freq: Once | ORAL | Status: AC
  Administered 2015-09-13: 5 mg via ORAL
  Filled 2015-09-13 (×2): qty 1

## 2015-09-13 NOTE — Progress Notes (Signed)
ANTICOAGULATION CONSULT NOTE  Pharmacy Consult for warfarin Indication: DVT  No Known Allergies  Patient Measurements: Height: 5' (152.4 cm) Weight: 118 lb 6.2 oz (53.7 kg) IBW/kg (Calculated) : 45.5  Vital Signs: Temp: 100.2 F (37.9 C) (01/12 0420) Temp Source: Oral (01/12 0420) BP: 157/59 mmHg (01/12 0420) Pulse Rate: 80 (01/12 0420)  Labs:  Recent Labs  09/11/15 0450 09/12/15 0433 09/13/15 0423  HGB 9.5* 9.2*  --   HCT 29.9* 29.6*  --   PLT 177 209  --   LABPROT 18.3* 20.8* 23.9*  INR 1.51* 1.80* 2.15*  CREATININE 0.69  --   --     Estimated Creatinine Clearance: 42.3 mL/min (by C-G formula based on Cr of 0.69).   Medical History: Past Medical History  Diagnosis Date  . Blood transfusion   . Depression     Mild  . History of chicken pox   . Hyperlipidemia   . Hypertension   . Degenerative joint disease   . Phlebitis   . Multiple myeloma     per Dr. Julien Nordmann, s/p palliative radiation for leg pain 2011 per Dr. Sondra Come  . Deep vein thrombosis of bilateral lower extremities (HCC)   . Family history of anesthesia complication     DAUGHTER CPR AFTER  . Cardiomyopathy ([place])   . Diabetes mellitus     Steroid related  . UTI (urinary tract infection) 09/08/2013  . History of radiation therapy 08/30/13-09/20/13    20 gray to lower lumbar/upper sacrum    Medications:  PTA warfarin dose: '5mg'$  daily except 2.'5mg'$  on Mon.  Last dose PTA 1/8 am.   Coumadin is managed by Dr Earlie Server.  Daughter (caregiver) states INR flucuates frequently.   Assessment: 80 yo F on chronic Coumadin for hx DVT.  INR sub-therapeutic on admission.   09/13/2015:   INR therapeutic today (2.15)  CBC stable  No bleeding documented  Cardiac diet ordered - not eating most meals; ate 50% of lunch tray 1/11  On concomitant plavix (would likely need to hold 5 days prior to surgery if planned)  Goal of Therapy:  INR 2-3   Plan:  1) Warfarin '5mg'$  this evening 2) Daily INR  Netta Cedars, PharmD, BCPS Pager: [phone] 09/13/2015 9:17 AM

## 2015-09-13 NOTE — Telephone Encounter (Signed)
Called and LMOVM last night.  Will try again later today.

## 2015-09-13 NOTE — Telephone Encounter (Signed)
I called her daughter [name].  Discussed recent events.   (noted that the patient's other daughter/[name]'s sister had a CVA per [name]'s report- I offered my condolences). I had seen inpatient records in the meantime re: [name].   I offered my support/concern.   I appreciate help from inpatient team.  Will await inpatient recs, d/c summary.   She appreciated the call.

## 2015-09-13 NOTE — Progress Notes (Signed)
Physical Therapy Treatment Patient Details Name: [name] MRN: [id number] DOB: Nov 27, 1935 Today's Date: 09/13/2015    History of Present Illness Pt admitted with dx cellulitis L foot and hx of DM, L ankle fx and multiple myeloma (currently being treated)    PT Comments    Pt OOB in recliner.  Was only able to assist to and from [facility] due to pain level and inability to place L foot on floor.    Follow Up Recommendations  Home health PT;No PT follow up (pending progress)     Equipment Recommendations       Recommendations for Other Services       Precautions / Restrictions Precautions Precautions: Fall Restrictions Weight Bearing Restrictions: No    Mobility  Bed Mobility               General bed mobility comments: Pt OOB in recliner  Transfers Overall transfer level: Needs assistance Equipment used: None Transfers: Stand Pivot Transfers Sit to Stand: Supervision         General transfer comment: assisted from recliner to [facility] and back to recliner only due to inability to weightbear thru L LE due to pain level.    Ambulation/Gait             General Gait Details: unable to attempt due to pain level and pt self non weight tolerance during transfer.    Stairs            Wheelchair Mobility    Modified Rankin (Stroke Patients Only)       Balance                                    Cognition Arousal/Alertness: Awake/alert Behavior During Therapy: WFL for tasks assessed/performed Overall Cognitive Status: Within Functional Limits for tasks assessed                      Exercises      General Comments        Pertinent Vitals/Pain Pain Assessment: Faces Faces Pain Scale: Hurts even more Pain Location: L foot with activity Pain Descriptors / Indicators: Grimacing;Nagging Pain Intervention(s): Monitored during session    Home Living                      Prior Function            PT Goals  (current goals can now be found in the care plan section) Progress towards PT goals: Progressing toward goals    Frequency  Min 3X/week    PT Plan      Co-evaluation             End of Session Equipment Utilized During Treatment: Gait belt Activity Tolerance: Patient limited by pain Patient left: in chair     Time: 8786-7672 PT Time Calculation (min) (ACUTE ONLY): 10 min  Charges:  $Gait Training: 8-22 mins                    G Codes:      Rica Koyanagi  PTA [facility]  Acute  Rehab Pager      [phone]

## 2015-09-13 NOTE — Progress Notes (Signed)
Progress Note   [name] UMP:[id number] DOB: 12-14-1935 DOA: 09/09/2015 PCP: [name], MD   Brief Narrative:   [name] is an 80 y.o. female PMH of HTN, DM2, Hyperlipidemia, Hx DVTs on Coumadin Rx, Multiple Myeloma (dx 2009) currently on Chemo Rx, and PVD who recently seen by Vascular, Dr. Delana Meyer, in [place] on 08/28/16 and had an aortogram due to ischemia of her middle toes of the left foot who was admitted 09/09/15 with complaints of a 3 day history of increased pain and redness of the left foot.  Assessment/Plan:   Principal Problem:   Cellulitis of left foot with ischemic changes to the second and third toes in the setting of PVD and severe tibial arterial occlusive disease - MRI negative for clear osteomyelitis. - Continue empiric vancomycin. - Follow-up blood cultures. - Wound care RN consultation. - Status post evaluation by vascular surgeon with no current recommendations for surgical intervention.  Active Problems:   Multiple myeloma ([place]) - Currently undergoing tx with Carfilzomib, Cytoxan and Decadron, will start cycle #14 09/18/15.      Diabetes mellitus without complication ([place]) - Currently being managed with SSI.    Essential hypertension - Continue lisinopril/HCTZ.    Hypokalemia - Monitor and replace as needed.    PVD (peripheral vascular disease) ([place]) with ischemic toes - Continue Plavix. History of angioplasty of posterior tibial artery. - Evaluated by vascular surgeon 09/11/15, recommendations to follow-up at wound care center. - Ultimately may need toe amputation if toes continue to progress and become necrotic.    History of DVTs/long-term use of anticoagulant therapy DVT Prophylaxis - Continue Coumadin.   Family Communication/Anticipated D/C date and plan/Code Status   Family Communication: Son at the bedside. Disposition Plan: Home when pain better controlled, may possibly need orthopedic consultation for consideration of  amputation of second and third toes. Anticipated D/C date:   Depends on whether amputation will be needed. Code Status: Full code.   IV Access:    Peripheral IV   Procedures and diagnostic studies:   Ct Head Wo Contrast  09/10/2015  CLINICAL DATA:  Confusion.  History of multiple myeloma EXAM: CT HEAD WITHOUT CONTRAST TECHNIQUE: Contiguous axial images were obtained from the base of the skull through the vertex without intravenous contrast. COMPARISON:  March 10, 2014 FINDINGS: There is mild diffuse atrophy. There is no intracranial mass, hemorrhage, extra-axial fluid collection, or midline shift. There is mild small vessel disease in the centra semiovale bilaterally. There is no new gray-white compartment lesion. No acute infarct evident. There are multiple small lytic lesions in calvarium consistent with multiple myeloma. No fractures are identified. The mastoid air cells are clear. No intraorbital lesions are identified. IMPRESSION: Bony changes in the calvarium consistent with multiple myeloma. There is no apparent progression of disease since most recent prior study. There is atrophy with periventricular small vessel disease, stable. No intra-axial mass, hemorrhage, or extra-axial fluid collection. No acute infarct evident. Electronically Signed   By: Lowella Grip III M.D.   On: 09/10/2015 15:03   Mr Foot Left Wo Contrast  09/10/2015  CLINICAL DATA:  . History of multiple myeloma. On chemo therapy currently. Increased pain and redness. EXAM: MRI OF THE LEFT FOREFOOT WITHOUT CONTRAST TECHNIQUE: Multiplanar, multisequence MR imaging was performed. No intravenous contrast was administered. COMPARISON:  None. FINDINGS: Patient motion degrades image quality limiting evaluation. There is no acute fracture or dislocation. There is no joint effusion. There is no bone destruction or periosteal  reaction. There is mild marrow edema in the second distal phalanx with minimal T1 hypointensity. There is  minimal marrow edema in the third distal phalanx without T1 hypointensity. There is no other marrow signal abnormality. The soft tissues are normal. There is no drainable fluid collection or hematoma. The flexor, extensor and peroneal tendons are intact. The muscles are normal. IMPRESSION: 1. Minimal marrow edema in the second and third distal phalanx without definite cortical destruction. The may reflect reactive changes secondary to mild ischemia. No definite evidence of osteomyelitis of this time. Electronically Signed   By: Kathreen Devoid   On: 09/10/2015 08:12   Dg Foot Complete Left  09/09/2015  CLINICAL DATA:  Chronic left foot pain for over 1 years. Redness noted to left foot, accompanied with necrosis to 2nd-4th toes. Pt's daughter states that pt has poor blood flow. Pt currently undergoing chemo for multiple myeloma. EXAM: LEFT FOOT - COMPLETE 3+ VIEW COMPARISON:  None. FINDINGS: Extensive diffuse small vessel calcification. No fracture or dislocation. Postsurgical change at the ankle. Limited evaluation of first distal phalanx due to persistent flexion at the first interphalangeal joint. As seen best on the lateral view, no acute findings appreciated however. No periosteal reaction or cortical destruction identified. Prominent heel spur. IMPRESSION: No acute abnormalities Electronically Signed   By: Skipper Cliche M.D.   On: 09/09/2015 16:01     Medical Consultants:    Vascular Surgery, Dr. [name]  Anti-Infectives:   Vancomycin 09/09/15--->   Subjective:   [name] reports severe foot pain and doesn't feel well. No frank nausea or vomiting. Poor appetite.  Objective:    Filed Vitals:   09/12/15 0632 09/12/15 1449 09/12/15 2155 09/13/15 0420  BP: 155/54 185/73 176/64 157/59  Pulse: 87 68 72 80  Temp: 99.4 F (37.4 C) 99.6 F (37.6 C) 99.9 F (37.7 C) 100.2 F (37.9 C)  TempSrc: Oral Oral Oral Oral  Resp: _0 Height:      Weight:      SpO2: 100%  100% 100% 100%    Intake/Output Summary (Last 24 hours) at 09/13/15 0817 Last data filed at 09/12/15 2211  Gross per 24 hour  Intake   1460 ml  Output    400 ml  Net   1060 ml   Filed Weights   09/09/15 2110  Weight: 53.7 kg (118 lb 6.2 oz)    Exam: Gen:  NAD Cardiovascular:  RRR, No M/R/G Respiratory:  Lungs CTAB Gastrointestinal:  Abdomen soft, NT/ND, + BS Extremities:  Left second and third toes with dark purple discoloration   Data Reviewed:    Labs: Basic Metabolic Panel:  Recent Labs Lab 09/09/15 1609 09/10/15 0500 09/11/15 0450  NA 140 140 144  K 3.4* 3.1* 3.6  CL 101 103 109  CO2 _1 GLUCOSE 147* 141* 142*  BUN _2 CREATININE 0.83 0.80 0.69  CALCIUM 10.1 9.6 9.3   GFR Estimated Creatinine Clearance: 42.3 mL/min (by C-G formula based on Cr of 0.69). Liver Function Tests:  Recent Labs Lab 09/09/15 1609  AST 20  ALT 16  ALKPHOS 63  BILITOT 0.7  PROT 6.3*  ALBUMIN 3.5   No results for input(s): LIPASE, AMYLASE in the last 168 hours. No results for input(s): AMMONIA in the last 168 hours. Coagulation profile  Recent Labs Lab 09/09/15 1609 09/10/15 0500 09/11/15 0450 09/12/15 0433 09/13/15 0423  INR 1.66* 1.55* 1.51* 1.80* 2.15*    CBC:  Recent Labs Lab 09/09/15 1609 09/10/15 0500 09/11/15 0450 09/12/15 0433  WBC 9.1 8.4 6.1 5.8  NEUTROABS 8.0*  --   --   --   HGB 10.7* 9.7* 9.5* 9.2*  HCT 32.9* 30.1* 29.9* 29.6*  MCV 98.2 98.4 99.7 98.0  PLT 182 153 177 209   Cardiac Enzymes: No results for input(s): CKTOTAL, CKMB, CKMBINDEX, TROPONINI in the last 168 hours. BNP (last 3 results) No results for input(s): PROBNP in the last 8760 hours. CBG:  Recent Labs Lab 09/12/15 0755 09/12/15 1129 09/12/15 1744 09/12/15 2200 09/13/15 0753  GLUCAP 136* 144* 145* 140* 147*   D-Dimer: No results for input(s): DDIMER in the last 72 hours. Hgb A1c: No results for input(s): HGBA1C in the last 72 hours. Lipid  Profile: No results for input(s): CHOL, HDL, LDLCALC, TRIG, CHOLHDL, LDLDIRECT in the last 72 hours. Thyroid function studies: No results for input(s): TSH, T4TOTAL, T3FREE, THYROIDAB in the last 72 hours.  Invalid input(s): FREET3 Anemia work up: No results for input(s): VITAMINB12, FOLATE, FERRITIN, TIBC, IRON, RETICCTPCT in the last 72 hours. Sepsis Labs:  Recent Labs Lab 09/09/15 1609 09/09/15 1622 09/10/15 0500 09/11/15 0450 09/12/15 0433  WBC 9.1  --  8.4 6.1 5.8  LATICACIDVEN  --  1.79  --   --   --    Microbiology Recent Results (from the past 240 hour(s))  Culture, blood (Routine X 2) w Reflex to ID Panel     Status: None (Preliminary result)   Collection Time: 09/09/15  4:10 PM  Result Value Ref Range Status   Specimen Description BLOOD RIGHT ANTECUBITAL  Final   Special Requests BOTTLES DRAWN AEROBIC AND ANAEROBIC 5CC  Final   Culture   Final    NO GROWTH 2 DAYS Performed at [facility]    Report Status PENDING  Incomplete  Culture, blood (Routine X 2) w Reflex to ID Panel     Status: None (Preliminary result)   Collection Time: 09/09/15  4:25 PM  Result Value Ref Range Status   Specimen Description BLOOD LEFT ANTECUBITAL  Final   Special Requests BOTTLES DRAWN AEROBIC AND ANAEROBIC 5CC  Final   Culture   Final    NO GROWTH 2 DAYS Performed at [facility]    Report Status PENDING  Incomplete     Medications:   . acyclovir  400 mg Oral BID  . amLODipine  5 mg Oral Daily  . calcium-vitamin D  1 tablet Oral Daily  . clopidogrel  75 mg Oral Daily  . insulin aspart  0-5 Units Subcutaneous QHS  . insulin aspart  0-9 Units Subcutaneous TID WC  . lisinopril  20 mg Oral Daily  . potassium chloride  20 mEq Oral BID  . vancomycin  500 mg Intravenous Q12H  . Warfarin - Pharmacist Dosing Inpatient   Does not apply q1800   Continuous Infusions: . sodium chloride 20 mL/hr at 09/10/15 2000    Time spent: 25 minutes.   LOS: 4 days    [facility] Pager [phone]. If unable to reach me by pager, please call my cell phone at [phone].  *Please refer to [email or web address], password [email or web address] to get updated schedule on who will round on this patient, as hospitalists switch teams weekly. If 7PM-7AM, please contact night-coverage at [email or web address], password [email or web address] for any overnight needs.  09/13/2015, 8:17 AM

## 2015-09-14 DIAGNOSIS — E119 Type 2 diabetes mellitus without complications: Secondary | ICD-10-CM

## 2015-09-14 DIAGNOSIS — I10 Essential (primary) hypertension: Secondary | ICD-10-CM

## 2015-09-14 DIAGNOSIS — I96 Gangrene, not elsewhere classified: Secondary | ICD-10-CM

## 2015-09-14 DIAGNOSIS — L03116 Cellulitis of left lower limb: Secondary | ICD-10-CM

## 2015-09-14 LAB — GLUCOSE, CAPILLARY
GLUCOSE-CAPILLARY: 175 mg/dL — AB (ref 65–99)
GLUCOSE-CAPILLARY: 131 mg/dL — AB (ref 65–99)
Glucose-Capillary: 111 mg/dL — ABNORMAL HIGH (ref 65–99)
Glucose-Capillary: 126 mg/dL — ABNORMAL HIGH (ref 65–99)

## 2015-09-14 LAB — PROTIME-INR
INR: 2.3 — ABNORMAL HIGH (ref 0.00–1.49)
Prothrombin Time: 25.1 seconds — ABNORMAL HIGH (ref 11.6–15.2)

## 2015-09-14 MED ORDER — POTASSIUM CHLORIDE CRYS ER 20 MEQ PO TBCR: 20.0000 meq | EXTENDED_RELEASE_TABLET | Freq: Every day | ORAL | Status: DC

## 2015-09-14 MED ORDER — DOXYCYCLINE HYCLATE 100 MG PO TABS
100.0000 mg | ORAL_TABLET | Freq: Two times a day (BID) | ORAL | Status: DC
Start: 2015-09-14 — End: 2015-09-15
  Administered 2015-09-14 – 2015-09-15 (×3): 100 mg via ORAL
  Filled 2015-09-14 (×4): qty 1

## 2015-09-14 MED ORDER — OXYCODONE HCL 5 MG PO TABS: 10.0000 mg | ORAL_TABLET | Freq: Four times a day (QID) | ORAL | Status: DC | PRN

## 2015-09-14 MED ORDER — WARFARIN SODIUM 5 MG PO TABS
5.0000 mg | ORAL_TABLET | Freq: Once | ORAL | Status: AC
  Administered 2015-09-14: 5 mg via ORAL
  Filled 2015-09-14: qty 1

## 2015-09-14 MED ORDER — OXYCODONE HCL 5 MG PO TABS
10.0000 mg | ORAL_TABLET | ORAL | Status: DC | PRN
  Administered 2015-09-14 (×2): 10 mg via ORAL
  Filled 2015-09-14 (×3): qty 2

## 2015-09-14 MED ORDER — AMLODIPINE BESYLATE 5 MG PO TABS: 5.0000 mg | ORAL_TABLET | Freq: Every day | ORAL | Status: DC

## 2015-09-14 MED ORDER — WARFARIN SODIUM 5 MG PO TABS: ORAL_TABLET | ORAL | Status: DC

## 2015-09-14 MED ORDER — DOXYCYCLINE HYCLATE 100 MG PO TABS: 100.0000 mg | ORAL_TABLET | Freq: Two times a day (BID) | ORAL | Status: DC

## 2015-09-14 NOTE — Consult Note (Signed)
WOC consulted for wound care, however after review of chart it appears [facility] has recommended dry dressings only.  Noted from hospitalist today that patient may need surgical intervention (amputation) while inpatient.  For this reason I will only recommend dry dressings at this time.   Wound type: arterial/gangrene, dry Measurement: 2nd and 3rd toes, black, hard, dry Drainage (amount, consistency, odor) none Periwound: intact  Dressing procedure/placement/frequency: Dry dressing only. Change PRN    Discussed POC with patient and bedside nurse.  Re consult if needed, will not follow at this time. Thanks  Ajooni Karam Kellogg, [place] [phone])

## 2015-09-14 NOTE — Progress Notes (Signed)
Physical Therapy Treatment Patient Details Name: [name] MRN: [id number] DOB: 01-Jul-1936 Today's Date: 09/14/2015    History of Present Illness Pt admitted with dx cellulitis L foot and hx of DM, L ankle fx and multiple myeloma (currently being treated)    PT Comments    Pt OOB  in recliner.  assisted with attempting amb however limited due to inability to tolerate WBing thru L foot.    Follow Up Recommendations  Home health PT;No PT follow up (pending progress)     Equipment Recommendations       Recommendations for Other Services       Precautions / Restrictions Precautions Precautions: Fall Restrictions Weight Bearing Restrictions: No    Mobility  Bed Mobility               General bed mobility comments: Pt OOB in recliner  Transfers Overall transfer level: Needs assistance Equipment used: Rolling walker (2 wheeled) Transfers: Sit to/from Stand Sit to Stand: Supervision;Min guard         General transfer comment: 25% VC's on proper hand placement and increased time  Ambulation/Gait Ambulation/Gait assistance: Min assist Ambulation Distance (Feet): 8 Feet Assistive device: Rolling walker (2 wheeled) Gait Pattern/deviations: Step-to pattern;Decreased stance time - left;Trunk flexed     General Gait Details: limited amb distance due to inability to tolerate WBing thru L LE    Stairs            Wheelchair Mobility    Modified Rankin (Stroke Patients Only)       Balance                                    Cognition Arousal/Alertness: Awake/alert Behavior During Therapy: WFL for tasks assessed/performed Overall Cognitive Status: Within Functional Limits for tasks assessed                      Exercises      General Comments        Pertinent Vitals/Pain Pain Assessment: Faces Faces Pain Scale: Hurts little more Pain Location: L foot with activity Pain Descriptors / Indicators:  Grimacing;Nagging Pain Intervention(s): Monitored during session;Repositioned    Home Living                      Prior Function            PT Goals (current goals can now be found in the care plan section) Progress towards PT goals: Progressing toward goals    Frequency  Min 3X/week    PT Plan      Co-evaluation             End of Session Equipment Utilized During Treatment: Gait belt Activity Tolerance: Patient limited by pain Patient left: in chair;with call bell/phone within reach     Time: 0910-0920 PT Time Calculation (min) (ACUTE ONLY): 10 min  Charges:  $Gait Training: 8-22 mins                    G Codes:      [name]  PTA [facility]  Acute  Rehab Pager      [phone]

## 2015-09-14 NOTE — Progress Notes (Addendum)
Progress Note   [name] SHF:[id number] DOB: Nov 12, 1935 DOA: 09/09/2015 PCP: [name], MD   Brief Narrative:   [name] is an 80 y.o. female PMH of HTN, DM2, Hyperlipidemia, Hx DVTs on Coumadin Rx, Multiple Myeloma (dx 2009) currently on Chemo Rx, and PVD who recently seen by Vascular, Dr. Delana Meyer, in [place] on 08/28/16 and had an aortogram due to ischemia of her middle toes of the left foot who was admitted 09/09/15 with complaints of a 3 day history of increased pain and redness of the left foot. VVS consulted, No re-vasc options, will need amputation down the road, declines this now  Assessment/Plan:   Principal Problem:   Cellulitis of left foot with ischemic changes to the second and third toes in the setting of PVD and severe tibial arterial occlusive disease - MRI negative for osteomyelitis. - Do not see evidence of cellulitis anymore, except for gangrenous changes, has been on Empiric IV Vancomycin for 5days change to PO Doxycyline today - Blood cx negative - Status post evaluation by Vascular Dr.Dickson, he felt that she had severe tibial artery occlusive disease and did undergo successful angioplasty of disease in her posterior tibial artery by surgeon in [place], he felt that there are no further options for revascularization, it was felt that if the toes do not heal and she should need a below-the-knee amputation, he recommended discharge on po antibiotics the wounds could be followed as an outpatient.  -Given severe pain, i recommended considering amputation/surigical eval again now(1/13), she refuses consideration of amputation toes or BKA at this time. -i will increase her oxycodone to 72m Q6 from 541mand ask PT to re-evaluate to see if she needs rehab  Active Problems:   Multiple myeloma ([facility]- Currently undergoing tx with Carfilzomib, Cytoxan and Decadron, will start cycle #14 09/18/15.  -followed by Dr.Mohamed     Di[place]  complication [place]- Currently being managed with SSI.    Essential hypertension - Continue lisinopril/HCTZ.    Hypokalemia - Monitor and replace as needed.    PVD (peripheral vascular disease) ([facility] ischemic [place] - Continue Plavix. History of angioplasty of posterior tibial artery. - see discussion above    History of DVTs/long-term use of anticoagulant therapy DVT Prophylaxis - Continue Coumadin.   Family Communication/Anticipated D/C date and plan/Code Status   Family Communication: none at bedside, called and d/w daughter Amy Disposition Plan: Home when pain better controlled, declines Amputation  Code Status: Full code.   IV Access:    Peripheral IV   Procedures and diagnostic studies:   Ct Head Wo Contrast  09/10/2015  CLINICAL DATA:  Confusion.  History of multiple myeloma EXAM: CT HEAD WITHOUT CONTRAST TECHNIQUE: Contiguous axial images were obtained from the base of the skull through the vertex without intravenous contrast. COMPARISON:  March 10, 2014 FINDINGS: There is mild diffuse atrophy. There is no intracranial mass, hemorrhage, extra-axial fluid collection, or midline shift. There is mild small vessel disease in the centra semiovale bilaterally. There is no new gray-white compartment lesion. No acute infarct evident. There are multiple small lytic lesions in calvarium consistent with multiple myeloma. No fractures are identified. The mastoid air cells are clear. No intraorbital lesions are identified. IMPRESSION: Bony changes in the calvarium consistent with multiple myeloma. There is no apparent progression of disease since most recent prior study. There is atrophy with periventricular small vessel disease, stable. No intra-axial mass, hemorrhage, or extra-axial fluid collection. No acute  infarct evident. Electronically Signed   By: Lowella Grip III M.D.   On: 09/10/2015 15:03   Mr Foot Left Wo Contrast  09/10/2015  CLINICAL DATA:  . History of multiple  myeloma. On chemo therapy currently. Increased pain and redness. EXAM: MRI OF THE LEFT FOREFOOT WITHOUT CONTRAST TECHNIQUE: Multiplanar, multisequence MR imaging was performed. No intravenous contrast was administered. COMPARISON:  None. FINDINGS: Patient motion degrades image quality limiting evaluation. There is no acute fracture or dislocation. There is no joint effusion. There is no bone destruction or periosteal reaction. There is mild marrow edema in the second distal phalanx with minimal T1 hypointensity. There is minimal marrow edema in the third distal phalanx without T1 hypointensity. There is no other marrow signal abnormality. The soft tissues are normal. There is no drainable fluid collection or hematoma. The flexor, extensor and peroneal tendons are intact. The muscles are normal. IMPRESSION: 1. Minimal marrow edema in the second and third distal phalanx without definite cortical destruction. The may reflect reactive changes secondary to mild ischemia. No definite evidence of osteomyelitis of this time. Electronically Signed   By: Kathreen Devoid   On: 09/10/2015 08:12   Dg Foot Complete Left  09/09/2015  CLINICAL DATA:  Chronic left foot pain for over 1 years. Redness noted to left foot, accompanied with necrosis to 2nd-4th toes. Pt's daughter states that pt has poor blood flow. Pt currently undergoing chemo for multiple myeloma. EXAM: LEFT FOOT - COMPLETE 3+ VIEW COMPARISON:  None. FINDINGS: Extensive diffuse small vessel calcification. No fracture or dislocation. Postsurgical change at the ankle. Limited evaluation of first distal phalanx due to persistent flexion at the first interphalangeal joint. As seen best on the lateral view, no acute findings appreciated however. No periosteal reaction or cortical destruction identified. Prominent heel spur. IMPRESSION: No acute abnormalities Electronically Signed   By: Skipper Cliche M.D.   On: 09/09/2015 16:01     Medical Consultants:    Vascular  Surgery, Dr. [name]  Anti-Infectives:   Vancomycin 09/09/15--->   Subjective:   C/o severe foot/toe pain, unable to put her foot down  Objective:    Filed Vitals:   09/13/15 1100 09/13/15 1700 09/13/15 2110 09/14/15 0500  BP: 152/62 171/72 163/69 164/60  Pulse:  72 69 77  Temp:  98.8 F (37.1 C) 100.1 F (37.8 C) 99.7 F (37.6 C)  TempSrc:  Oral Oral Oral  Resp:  _0 Height:      Weight:      SpO2:  100% 100% 98%    Intake/Output Summary (Last 24 hours) at 09/14/15 1133 Last data filed at 09/14/15 0859  Gross per 24 hour  Intake    540 ml  Output      0 ml  Net    540 ml   Filed Weights   09/09/15 2110  Weight: 53.7 kg (118 lb 6.2 oz)    Exam: Gen:  NAD Cardiovascular:  RRR, No M/R/G Respiratory:  Lungs CTAB Gastrointestinal:  Abdomen soft, NT/ND, + BS Extremities:  Left second and third toes with dark discoloration and early gangrene   Data Reviewed:    Labs: Basic Metabolic Panel:  Recent Labs Lab 09/09/15 1609 09/10/15 0500 09/11/15 0450  NA 140 140 144  K 3.4* 3.1* 3.6  CL 101 103 109  CO2 _1 GLUCOSE 147* 141* 142*  BUN _2 CREATININE 0.83 0.80 0.69  CALCIUM 10.1 9.6 9.3   GFR Estimated Creatinine  Clearance: 42.3 mL/min (by C-G formula based on Cr of 0.69). Liver Function Tests:  Recent Labs Lab 09/09/15 1609  AST 20  ALT 16  ALKPHOS 63  BILITOT 0.7  PROT 6.3*  ALBUMIN 3.5   No results for input(s): LIPASE, AMYLASE in the last 168 hours. No results for input(s): AMMONIA in the last 168 hours. Coagulation profile  Recent Labs Lab 09/10/15 0500 09/11/15 0450 09/12/15 0433 09/13/15 0423 09/14/15 0455  INR 1.55* 1.51* 1.80* 2.15* 2.30*    CBC:  Recent Labs Lab 09/09/15 1609 09/10/15 0500 09/11/15 0450 09/12/15 0433  WBC 9.1 8.4 6.1 5.8  NEUTROABS 8.0*  --   --   --   HGB 10.7* 9.7* 9.5* 9.2*  HCT 32.9* 30.1* 29.9* 29.6*  MCV 98.2 98.4 99.7 98.0  PLT 182 153 177 209   Cardiac  Enzymes: No results for input(s): CKTOTAL, CKMB, CKMBINDEX, TROPONINI in the last 168 hours. BNP (last 3 results) No results for input(s): PROBNP in the last 8760 hours. CBG:  Recent Labs Lab 09/13/15 0753 09/13/15 1251 09/13/15 1741 09/13/15 2300 09/14/15 0815  GLUCAP 147* 193* 136* 120* 175*   D-Dimer: No results for input(s): DDIMER in the last 72 hours. Hgb A1c: No results for input(s): HGBA1C in the last 72 hours. Lipid Profile: No results for input(s): CHOL, HDL, LDLCALC, TRIG, CHOLHDL, LDLDIRECT in the last 72 hours. Thyroid function studies: No results for input(s): TSH, T4TOTAL, T3FREE, THYROIDAB in the last 72 hours.  Invalid input(s): FREET3 Anemia work up: No results for input(s): VITAMINB12, FOLATE, FERRITIN, TIBC, IRON, RETICCTPCT in the last 72 hours. Sepsis Labs:  Recent Labs Lab 09/09/15 1609 09/09/15 1622 09/10/15 0500 09/11/15 0450 09/12/15 0433  WBC 9.1  --  8.4 6.1 5.8  LATICACIDVEN  --  1.79  --   --   --    Microbiology Recent Results (from the past 240 hour(s))  Culture, blood (Routine X 2) w Reflex to ID Panel     Status: None (Preliminary result)   Collection Time: 09/09/15  4:10 PM  Result Value Ref Range Status   Specimen Description BLOOD RIGHT ANTECUBITAL  Final   Special Requests BOTTLES DRAWN AEROBIC AND ANAEROBIC 5CC  Final   Culture   Final    NO GROWTH 3 DAYS Performed at [facility]    Report Status PENDING  Incomplete  Culture, blood (Routine X 2) w Reflex to ID Panel     Status: None (Preliminary result)   Collection Time: 09/09/15  4:25 PM  Result Value Ref Range Status   Specimen Description BLOOD LEFT ANTECUBITAL  Final   Special Requests BOTTLES DRAWN AEROBIC AND ANAEROBIC 5CC  Final   Culture   Final    NO GROWTH 3 DAYS Performed at [facility]    Report Status PENDING  Incomplete     Medications:   . acyclovir  400 mg Oral BID  . amLODipine  5 mg Oral Daily  . calcium-vitamin D  1 tablet  Oral Daily  . clopidogrel  75 mg Oral Daily  . doxycycline  100 mg Oral Q12H  . insulin aspart  0-5 Units Subcutaneous QHS  . insulin aspart  0-9 Units Subcutaneous TID WC  . lisinopril  20 mg Oral Daily  . potassium chloride  20 mEq Oral BID  . warfarin  5 mg Oral ONCE-1800  . Warfarin - Pharmacist Dosing Inpatient   Does not apply q1800   Continuous Infusions: . sodium chloride 20 mL/hr  at 09/10/15 2000    Time spent: 25 minutes.   LOS: 5 days   [facility]  [facility] Pager [phone]  *Please refer to [email or web address], password [email or web address] to get updated schedule on who will round on this patient, as hospitalists switch teams weekly. If 7PM-7AM, please contact night-coverage at [email or web address], password [email or web address] for any overnight needs.  09/14/2015, 11:33 AM

## 2015-09-14 NOTE — Progress Notes (Signed)
[place] for warfarin Indication: DVT  No Known Allergies  Patient Measurements: Height: 5' (152.4 cm) Weight: 118 lb 6.2 oz (53.7 kg) IBW/kg (Calculated) : 45.5  Vital Signs: Temp: 99.7 F (37.6 C) (01/13 0500) Temp Source: Oral (01/13 0500) BP: 164/60 mmHg (01/13 0500) Pulse Rate: 77 (01/13 0500)  Labs:  Recent Labs  09/12/15 0433 09/13/15 0423 09/14/15 0455  HGB 9.2*  --   --   HCT 29.6*  --   --   PLT 209  --   --   LABPROT 20.8* 23.9* 25.1*  INR 1.80* 2.15* 2.30*    Estimated Creatinine Clearance: 42.3 mL/min (by C-G formula based on Cr of 0.69).   Medical History: Past Medical History  Diagnosis Date  . Blood transfusion   . Depression     Mild  . History of chicken pox   . Hyperlipidemia   . Hypertension   . Degenerative joint disease   . Phlebitis   . Multiple myeloma     per Dr. Julien Nordmann, s/p palliative radiation for leg pain 2011 per Dr. Sondra Come  . Deep vein thrombosis of bilateral lower extremities (HCC)   . Family history of anesthesia complication     DAUGHTER CPR AFTER  . Cardiomyopathy ([place])   . Diabetes mellitus     Steroid related  . UTI (urinary tract infection) 09/08/2013  . History of radiation therapy 08/30/13-09/20/13    20 gray to lower lumbar/upper sacrum    Medications:  PTA warfarin dose: 54m daily except 2.536mon Mon.  Last dose PTA 1/8 am.   Coumadin is managed by Dr MoEarlie Server Daughter (caregiver) states INR flucuates frequently.   Assessment: [age] F on chronic Coumadin for hx DVT.  INR sub-therapeutic on admission.   09/14/2015:   INR therapeutic today (2.3)  CBC stable  No bleeding documented  Cardiac diet ordered; eating ~ 50% of lunch & dinner trays   On concomitant plavix (would likely need to hold 5 days prior to surgery if planned)  Goal of Therapy:  INR 2-3   Plan:  1) Warfarin 61m17mhis evening 2) Daily INR  MicNetta CedarsharmD, BCPS Pager:  [phone]/[phone] 7:36 AM

## 2015-09-14 NOTE — Discharge Summary (Addendum)
Physician Discharge Summary  [name] WFU:[id number] DOB: [birth year] DOA: 09/09/2015  PCP: [name], MD  Admit date: 09/09/2015 Discharge date: 09/14/2015  Time spent: 45 minutes  Recommendations for Outpatient Follow-up:  1. PCP Dr.DUncan in 1 week, needs INR rechecked in 1 week 2. Dr.DIckson , VVS, on 1/25 at 8:30am, needs further discussion for Amputation as needed, depending on evolution of dry gangrene or toes of L foot 3. Dr.Mohamed next week, due for next cycle of Chemo for myeloma   Discharge Diagnoses:  Principal Problem:   Cellulitis    Dry Gangrene    Multiple myeloma ([place])   Diabetes mellitus without complication ([place])   Hyperlipidemia   Essential hypertension   Long term current use of anticoagulant therapy   PVD (peripheral vascular disease) ([place])   Dry gangrene ([place])   Discharge Condition: stable  Diet recommendation: DIabetic, heart healthy  Filed Weights   09/09/15 2110  Weight: 53.7 kg (118 lb 6.2 oz)    History of present illness:  Chief Complaint: Increasing Redness and pain in Left Foot HPI: [name] is a 80 y.o. 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 She had been seen by Vascular Dr. Delana Meyer in [place] on 12/28 and had an aortogram and stenting of posterior tibial artery due to ischemia of 2nd and 3rd toes of the left foot.  Hospital Course:  Cellulitis of left foot with ischemic changes to the second and third toes in the setting of PVD and severe tibial arterial occlusive disease - MRI negative for osteomyelitis. - Was being Rx for cellulitis, at this time do not see evidence of cellulitis anymore, except for gangrenous changes, has been on Empiric IV Vancomycin for 5days changed to PO Doxycyline 1/13 - Blood cx  negative - Status post evaluation by Vascular Dr.Dickson, he felt that she had severe tibial artery occlusive disease and did undergo successful angioplasty of disease in her posterior tibial artery by surgeon in [place], he felt that there are no further options for revascularization, it was felt that if the toes do not heal and she should need a below-the-knee amputation, he recommended discharge on po antibiotics the wounds could be followed as an outpatient.  -Given severe pain, i recommended considering amputation/surigical eval again now(1/13), she refuses consideration of amputation toes or BKA at this time, called and discussed with daughter who is in agreement, they want to FU with Dr.DIckson, i called his office and requested an appt, FU made for 1/25 at 8:30am -i will increase her oxycodone to '10mg'$  Q6 from '5mg'$  and ask PT to re-evaluate to see if she needs rehab  Active Problems:  Multiple myeloma ([place]) - Currently undergoing tx with Carfilzomib, Cytoxan and Decadron, will start cycle #14 09/18/15.  -followed by Dr.Mohamed   Diabetes mellitus without complication ([place]) - Was on SSI inpatient -Hbaic was 6.8, resume GLipizide at discharge   Essential hypertension - Continue lisinopril/HCTZ.   Hypokalemia - due to [place], replace.   PVD (peripheral vascular disease) ([place]) with ischemic toes - Continue Plavix. History of angioplasty of posterior tibial artery. - see discussion above   History of DVTs/long-term use of anticoagulant therapy - Continue Coumadin, INR 2.5 at discharge   Procedures: Vascular Dr.DIckson  Discharge Exam: Filed Vitals:   09/14/15  0500 09/14/15 1410  BP: 164/60 170/66  Pulse: 77 73  Temp: 99.7 F (37.6 C) 98.5 F (36.9 C)  Resp: 16 16    General: AAOx3 Cardiovascular: S1S2/RRR Respiratory: CTAB  Discharge Instructions   Discharge Instructions    Diet - low sodium heart healthy    Complete by:  As directed      Diet Carb  Modified    Complete by:  As directed      Increase activity slowly    Complete by:  As directed           Current Discharge Medication List    START taking these medications   Details  amLODipine (NORVASC) 5 MG tablet Take 1 tablet (5 mg total) by mouth daily.    doxycycline (VIBRA-TABS) 100 MG tablet Take 1 tablet (100 mg total) by mouth every 12 (twelve) hours. For 10days Qty: 20 tablet, Refills: 0      CONTINUE these medications which have CHANGED   Details  oxyCODONE (OXY IR/ROXICODONE) 5 MG immediate release tablet Take 2 tablets (10 mg total) by mouth every 6 (six) hours as needed for severe pain. Qty: 30 tablet, Refills: 0   Associated Diagnoses: Multiple myeloma, remission status unspecified (HCC)    potassium chloride SA (K-DUR,KLOR-CON) 20 MEQ tablet Take 1 tablet (20 mEq total) by mouth daily. Qty: 30 tablet, Refills: 0    warfarin (COUMADIN) 5 MG tablet Take 7.'5mg'$  (1 and half tablet) for 2days then resume home dose of '5mg'$  Qty: 60 tablet, Refills: 0      CONTINUE these medications which have NOT CHANGED   Details  acyclovir (ZOVIRAX) 400 MG tablet TAKE ONE TABLET BY MOUTH TWICE DAILY Qty: 60 tablet, Refills: 0   Associated Diagnoses: Multiple myeloma not having achieved remission (HCC)    ALPRAZolam (XANAX) 0.5 MG tablet TAKE ONE TABLET BY MOUTH TWICE DAILY AS NEEDED FOR ANXIETY Qty: 60 tablet, Refills: 0   Associated Diagnoses: Anxiety state    Calcium Carbonate-Vitamin D 600-400 MG-UNIT per tablet Take 1 tablet by mouth.     clopidogrel (PLAVIX) 75 MG tablet Take 1 tablet (75 mg total) by mouth daily. Qty: 30 tablet, Refills: 11    dexamethasone (DECADRON) 4 MG tablet TAKE 10 TABS BY MOUTH WEEKLY ON DAY 1 WITH CHEMOTHERAPY TREATMENT CARFILZOMIB Qty: 40 tablet, Refills: 0   Associated Diagnoses: Multiple myeloma not having achieved remission (HCC)    glipiZIDE (GLUCOTROL XL) 2.5 MG 24 hr tablet TAKE ONE TABLET BY MOUTH ONCE DAILY Qty: 90 tablet, Refills:  1    lisinopril-hydrochlorothiazide (PRINZIDE,ZESTORETIC) 20-12.5 MG per tablet Take 1 tablet by mouth daily. Qty: 90 tablet, Refills: 3    loperamide (IMODIUM A-D) 2 MG tablet Take 1 tablet (2 mg total) by mouth 4 (four) times daily as needed for diarrhea or loose stools.    prochlorperazine (COMPAZINE) 10 MG tablet Take 1 tablet (10 mg total) by mouth every 6 (six) hours as needed. Qty: 30 tablet, Refills: 0   Associated Diagnoses: Multiple myeloma (HCC)    temazepam (RESTORIL) 30 MG capsule Take 1 capsule (30 mg total) by mouth at bedtime as needed. Qty: 30 capsule, Refills: 0       No Known Allergies Follow-up Information    Follow up with [facility].   Why:  HHPT. Representative will call to make arrangements for Initial Appointment.    Contact information:   [address] [place] [address] [phone]       Follow  up with Waverly Ferrari, MD. Schedule an appointment as soon as possible for a visit in 2 weeks.   Specialties:  Vascular Surgery, Cardiology   Contact information:   [address] [place] [address] [phone]       Follow up with [name], MD. Schedule an appointment as soon as possible for a visit in 1 week.   Specialty:  Family Medicine   Contact information:   [address] [place] [address] [phone]        The results of significant diagnostics from this hospitalization (including imaging, microbiology, ancillary and laboratory) are listed below for reference.    Significant Diagnostic Studies: Dg Chest 2 View  09/11/2015  CLINICAL DATA:  Cough. EXAM: CHEST - 2 VIEW COMPARISON:  09/07/2014 and other prior chest radiographs including lateral chest x-ray on 09/08/2013. FINDINGS: The heart size and mediastinal contours are within normal limits. Mild bibasilar atelectasis present. There is no evidence of pulmonary edema, consolidation, pneumothorax, nodule or pleural fluid. Mid thoracic  compression deformity appears to have been present previously. IMPRESSION: Bibasilar atelectasis.  No active disease. Electronically Signed   By: Irish Lack M.D.   On: 09/11/2015 14:45   Ct Head Wo Contrast  09/10/2015  CLINICAL DATA:  Confusion.  History of multiple myeloma EXAM: CT HEAD WITHOUT CONTRAST TECHNIQUE: Contiguous axial images were obtained from the base of the skull through the vertex without intravenous contrast. COMPARISON:  March 10, 2014 FINDINGS: There is mild diffuse atrophy. There is no intracranial mass, hemorrhage, extra-axial fluid collection, or midline shift. There is mild small vessel disease in the centra semiovale bilaterally. There is no new gray-white compartment lesion. No acute infarct evident. There are multiple small lytic lesions in calvarium consistent with multiple myeloma. No fractures are identified. The mastoid air cells are clear. No intraorbital lesions are identified. IMPRESSION: Bony changes in the calvarium consistent with multiple myeloma. There is no apparent progression of disease since most recent prior study. There is atrophy with periventricular small vessel disease, stable. No intra-axial mass, hemorrhage, or extra-axial fluid collection. No acute infarct evident. Electronically Signed   By: Bretta Bang III M.D.   On: 09/10/2015 15:03   Mr Foot Left Wo Contrast  09/10/2015  CLINICAL DATA:  . History of multiple myeloma. On chemo therapy currently. Increased pain and redness. EXAM: MRI OF THE LEFT FOREFOOT WITHOUT CONTRAST TECHNIQUE: Multiplanar, multisequence MR imaging was performed. No intravenous contrast was administered. COMPARISON:  None. FINDINGS: Patient motion degrades image quality limiting evaluation. There is no acute fracture or dislocation. There is no joint effusion. There is no bone destruction or periosteal reaction. There is mild marrow edema in the second distal phalanx with minimal T1 hypointensity. There is minimal marrow edema  in the third distal phalanx without T1 hypointensity. There is no other marrow signal abnormality. The soft tissues are normal. There is no drainable fluid collection or hematoma. The flexor, extensor and peroneal tendons are intact. The muscles are normal. IMPRESSION: 1. Minimal marrow edema in the second and third distal phalanx without definite cortical destruction. The may reflect reactive changes secondary to mild ischemia. No definite evidence of osteomyelitis of this time. Electronically Signed   By: Elige Ko   On: 09/10/2015 08:12   Dg Foot Complete Left  09/09/2015  CLINICAL DATA:  Chronic left foot pain for over 1 years. Redness noted to left foot, accompanied with necrosis to 2nd-4th toes. Pt's daughter states that pt has poor blood flow.  Pt currently undergoing chemo for multiple myeloma. EXAM: LEFT FOOT - COMPLETE 3+ VIEW COMPARISON:  None. FINDINGS: Extensive diffuse small vessel calcification. No fracture or dislocation. Postsurgical change at the ankle. Limited evaluation of first distal phalanx due to persistent flexion at the first interphalangeal joint. As seen best on the lateral view, no acute findings appreciated however. No periosteal reaction or cortical destruction identified. Prominent heel spur. IMPRESSION: No acute abnormalities Electronically Signed   By: Skipper Cliche M.D.   On: 09/09/2015 16:01    Microbiology: Recent Results (from the past 240 hour(s))  Culture, blood (Routine X 2) w Reflex to ID Panel     Status: None (Preliminary result)   Collection Time: 09/09/15  4:10 PM  Result Value Ref Range Status   Specimen Description BLOOD RIGHT ANTECUBITAL  Final   Special Requests BOTTLES DRAWN AEROBIC AND ANAEROBIC 5CC  Final   Culture   Final    NO GROWTH 4 DAYS Performed at [facility]    Report Status PENDING  Incomplete  Culture, blood (Routine X 2) w Reflex to ID Panel     Status: None (Preliminary result)   Collection Time: 09/09/15  4:25 PM   Result Value Ref Range Status   Specimen Description BLOOD LEFT ANTECUBITAL  Final   Special Requests BOTTLES DRAWN AEROBIC AND ANAEROBIC 5CC  Final   Culture   Final    NO GROWTH 4 DAYS Performed at [facility]    Report Status PENDING  Incomplete     Labs: Basic Metabolic Panel:  Recent Labs Lab 09/09/15 1609 09/10/15 0500 09/11/15 0450  NA 140 140 144  K 3.4* 3.1* 3.6  CL 101 103 109  CO2 '28 28 27  '$ GLUCOSE 147* 141* 142*  BUN '16 18 15  '$ CREATININE 0.83 0.80 0.69  CALCIUM 10.1 9.6 9.3   Liver Function Tests:  Recent Labs Lab 09/09/15 1609  AST 20  ALT 16  ALKPHOS 63  BILITOT 0.7  PROT 6.3*  ALBUMIN 3.5   No results for input(s): LIPASE, AMYLASE in the last 168 hours. No results for input(s): AMMONIA in the last 168 hours. CBC:  Recent Labs Lab 09/09/15 1609 09/10/15 0500 09/11/15 0450 09/12/15 0433  WBC 9.1 8.4 6.1 5.8  NEUTROABS 8.0*  --   --   --   HGB 10.7* 9.7* 9.5* 9.2*  HCT 32.9* 30.1* 29.9* 29.6*  MCV 98.2 98.4 99.7 98.0  PLT 182 153 177 209   Cardiac Enzymes: No results for input(s): CKTOTAL, CKMB, CKMBINDEX, TROPONINI in the last 168 hours. BNP: BNP (last 3 results) No results for input(s): BNP in the last 8760 hours.  ProBNP (last 3 results) No results for input(s): PROBNP in the last 8760 hours.  CBG:  Recent Labs Lab 09/13/15 1251 09/13/15 1741 09/13/15 2300 09/14/15 0815 09/14/15 1136  GLUCAP 193* 136* 120* 175* 131*       Signed:  Guage Efferson MD.  [facility] 09/14/2015, 4:07 PM

## 2015-09-15 LAB — CBC
HEMATOCRIT: 30 % — AB (ref 36.0–46.0)
HEMOGLOBIN: 9.4 g/dL — AB (ref 12.0–15.0)
MCH: 31.1 pg (ref 26.0–34.0)
MCHC: 31.3 g/dL (ref 30.0–36.0)
MCV: 99.3 fL (ref 78.0–100.0)
Platelets: 337 10*3/uL (ref 150–400)
RBC: 3.02 MIL/uL — ABNORMAL LOW (ref 3.87–5.11)
RDW: 14.7 % (ref 11.5–15.5)
WBC: 4.8 10*3/uL (ref 4.0–10.5)

## 2015-09-15 LAB — BASIC METABOLIC PANEL
ANION GAP: 8 (ref 5–15)
BUN: 10 mg/dL (ref 6–20)
CHLORIDE: 114 mmol/L — AB (ref 101–111)
CO2: 21 mmol/L — AB (ref 22–32)
CREATININE: 0.61 mg/dL (ref 0.44–1.00)
Calcium: 8.6 mg/dL — ABNORMAL LOW (ref 8.9–10.3)
GFR calc non Af Amer: >60 mL/min (ref >60)
GLUCOSE: 160 mg/dL — AB (ref 65–99)
Potassium: 3.8 mmol/L (ref 3.5–5.1)
Sodium: 143 mmol/L (ref 135–145)

## 2015-09-15 LAB — CULTURE, BLOOD (ROUTINE X 2)
CULTURE: NO GROWTH
CULTURE: NO GROWTH

## 2015-09-15 LAB — GLUCOSE, CAPILLARY: Glucose-Capillary: 147 mg/dL — ABNORMAL HIGH (ref 65–99)

## 2015-09-15 LAB — PROTIME-INR
INR: 2.52 — AB (ref 0.00–1.49)
Prothrombin Time: 26.8 seconds — ABNORMAL HIGH (ref 11.6–15.2)

## 2015-09-15 MED ORDER — WARFARIN SODIUM 5 MG PO TABS: 5.0000 mg | ORAL_TABLET | Freq: Every day | ORAL | Status: DC

## 2015-09-15 NOTE — Plan of Care (Signed)
Problem: Education: Goal: Knowledge of [facility] Education information/materials will improve Outcome: Completed/Met Date Met:  09/15/15 Via family  Problem: Health Behavior/Discharge Planning: Goal: Ability to manage health-related needs will improve Outcome: Completed/Met Date Met:  09/15/15 Has good family support  Problem: Physical Regulation: Goal: Will remain free from infection Outcome: Not Applicable Date Met:  67/20/94 Has infection in left foot

## 2015-09-15 NOTE — Care Management (Signed)
CM spoke with the patient and her daughter at the bedside. Her daughter requested information about personal care for the patient when she and her sister are not able to sit with their mother. Provided a list of providers for PCA services. Presenter, broadcasting BSN CCM

## 2015-09-15 NOTE — Progress Notes (Signed)
Discharged from floor via w/c, daughter & belongings with pt. No changes in assessment. Bernice Mullin, [employer]

## 2015-09-17 ENCOUNTER — Telehealth: Payer: Self-pay

## 2015-09-17 ENCOUNTER — Other Ambulatory Visit: Payer: Self-pay | Admitting: Internal Medicine

## 2015-09-17 ENCOUNTER — Telehealth: Payer: Self-pay | Admitting: *Deleted

## 2015-09-17 DIAGNOSIS — Z79891 Long term (current) use of opiate analgesic: Secondary | ICD-10-CM | POA: Diagnosis not present

## 2015-09-17 DIAGNOSIS — I872 Venous insufficiency (chronic) (peripheral): Secondary | ICD-10-CM | POA: Diagnosis not present

## 2015-09-17 DIAGNOSIS — Z7984 Long term (current) use of oral hypoglycemic drugs: Secondary | ICD-10-CM | POA: Diagnosis not present

## 2015-09-17 DIAGNOSIS — E785 Hyperlipidemia, unspecified: Secondary | ICD-10-CM | POA: Diagnosis not present

## 2015-09-17 DIAGNOSIS — C9000 Multiple myeloma not having achieved remission: Secondary | ICD-10-CM | POA: Diagnosis not present

## 2015-09-17 DIAGNOSIS — E0952 Drug or chemical induced diabetes mellitus with diabetic peripheral angiopathy with gangrene: Secondary | ICD-10-CM | POA: Diagnosis not present

## 2015-09-17 DIAGNOSIS — Z792 Long term (current) use of antibiotics: Secondary | ICD-10-CM | POA: Diagnosis not present

## 2015-09-17 DIAGNOSIS — I10 Essential (primary) hypertension: Secondary | ICD-10-CM | POA: Diagnosis not present

## 2015-09-17 DIAGNOSIS — L97529 Non-pressure chronic ulcer of other part of left foot with unspecified severity: Secondary | ICD-10-CM | POA: Diagnosis not present

## 2015-09-17 DIAGNOSIS — L03116 Cellulitis of left lower limb: Secondary | ICD-10-CM | POA: Diagnosis not present

## 2015-09-17 DIAGNOSIS — Z7901 Long term (current) use of anticoagulants: Secondary | ICD-10-CM | POA: Diagnosis not present

## 2015-09-17 NOTE — Telephone Encounter (Signed)
Left detailed message on voicemail.  

## 2015-09-17 NOTE — Telephone Encounter (Signed)
Yes. Ok to delay her chemo by one week.

## 2015-09-17 NOTE — Telephone Encounter (Signed)
[name] pts daughter left v/m (DPR signed) requesting written order for in home assistance; pt was discharged from [facility] request cb.

## 2015-09-17 NOTE — Telephone Encounter (Signed)
Does she have [place] set up that could help with this?  Let me know.  Thanks.

## 2015-09-17 NOTE — Telephone Encounter (Signed)
LM for daughter- Dr Julien Nordmann says it is OK to cancel appts for this week. Start treatment on 09/25/15

## 2015-09-17 NOTE — Telephone Encounter (Signed)
Daughter called to say patient was discharged from hospital on Saturday. Has treatment scheduled for 1/17 and 1/18. Wants to know if it is OK to delay by 1 week. Pt is very tired. Please notify Jerilynn Mages @ [phone] if this is OK.

## 2015-09-18 ENCOUNTER — Ambulatory Visit: Payer: Medicare Other

## 2015-09-18 ENCOUNTER — Telehealth: Payer: Self-pay | Admitting: *Deleted

## 2015-09-18 ENCOUNTER — Ambulatory Visit: Payer: Medicare Other | Admitting: Internal Medicine

## 2015-09-18 ENCOUNTER — Encounter: Payer: Self-pay | Admitting: Vascular Surgery

## 2015-09-18 ENCOUNTER — Telehealth: Payer: Self-pay

## 2015-09-18 ENCOUNTER — Telehealth: Payer: Self-pay | Admitting: Internal Medicine

## 2015-09-18 ENCOUNTER — Other Ambulatory Visit: Payer: Self-pay | Admitting: Medical Oncology

## 2015-09-18 ENCOUNTER — Other Ambulatory Visit: Payer: Medicare Other

## 2015-09-18 NOTE — Telephone Encounter (Signed)
pof noted and patient is already on schedule for 1/24 lab and chemo

## 2015-09-18 NOTE — Telephone Encounter (Signed)
Please give the order.  Thanks.   

## 2015-09-18 NOTE — Telephone Encounter (Signed)
Merry Proud advised.

## 2015-09-18 NOTE — Telephone Encounter (Signed)
Merry Proud with [facility] left v/m requesting verbal orders for home health OT, skilled nursing and social work consult.

## 2015-09-18 NOTE — Telephone Encounter (Signed)
[name] from [facility] requested verbal orders for home health OT, skilled nursing and social work consult.  Dr. [name] gave the verbal order.  See phone note 09/18/2015.

## 2015-09-18 NOTE — Telephone Encounter (Signed)
Transitional care call attempted.  Left message for daughter [name] to return call, per DPR.

## 2015-09-19 ENCOUNTER — Ambulatory Visit: Payer: Medicare Other

## 2015-09-19 NOTE — Telephone Encounter (Signed)
Transitional care call attempted.  Left message for daughter [name] to return call, per DPR.

## 2015-09-19 NOTE — Telephone Encounter (Signed)
Transition Care Management Follow-up Telephone Call   Date discharged? 09/14/15   How have you been since you were released from the hospital? Still c/o some left foot pain   Do you understand why you were in the hospital? yes   Do you understand the discharge instructions? yes   Where were you discharged to? home   Items Reviewed:  Medications reviewed: yes  Allergies reviewed: yes  Dietary changes reviewed: no  Referrals reviewed: yes, cardiology/vascular surgery/home health-PT   Functional Questionnaire:   Activities of Daily Living (ADLs):   She states they are independent in the following: feeding, continence, and grooming States they require assistance with the following: ambulation, bathing, dressing, and toileting   Any transportation issues/concerns?: yes   Any patient concerns? yes, worsening circulation/poor appetite/questional dimentia   Confirmed importance and date/time of follow-up visits scheduled yes, 09/21/15 @ 1500  Provider Appointment booked with G. Renford Dills, MD  Confirmed with patient if condition begins to worsen call PCP or go to the ER.  Patient was given the office number and encouraged to call back with question or concerns.  : yes

## 2015-09-21 ENCOUNTER — Ambulatory Visit (INDEPENDENT_AMBULATORY_CARE_PROVIDER_SITE_OTHER): Payer: Medicare Other | Admitting: Family Medicine

## 2015-09-21 ENCOUNTER — Encounter: Payer: Self-pay | Admitting: Family Medicine

## 2015-09-21 ENCOUNTER — Telehealth: Payer: Self-pay | Admitting: Internal Medicine

## 2015-09-21 ENCOUNTER — Other Ambulatory Visit: Payer: Self-pay | Admitting: Medical Oncology

## 2015-09-21 VITALS — BP 150/76 | HR 70 | Temp 97.9°F | Wt 112.5 lb

## 2015-09-21 DIAGNOSIS — Z7901 Long term (current) use of anticoagulants: Secondary | ICD-10-CM

## 2015-09-21 DIAGNOSIS — L97529 Non-pressure chronic ulcer of other part of left foot with unspecified severity: Secondary | ICD-10-CM | POA: Diagnosis not present

## 2015-09-21 DIAGNOSIS — E0952 Drug or chemical induced diabetes mellitus with diabetic peripheral angiopathy with gangrene: Secondary | ICD-10-CM | POA: Diagnosis not present

## 2015-09-21 DIAGNOSIS — I872 Venous insufficiency (chronic) (peripheral): Secondary | ICD-10-CM | POA: Diagnosis not present

## 2015-09-21 DIAGNOSIS — I96 Gangrene, not elsewhere classified: Secondary | ICD-10-CM | POA: Diagnosis not present

## 2015-09-21 DIAGNOSIS — L03032 Cellulitis of left toe: Secondary | ICD-10-CM

## 2015-09-21 DIAGNOSIS — Z79891 Long term (current) use of opiate analgesic: Secondary | ICD-10-CM | POA: Diagnosis not present

## 2015-09-21 DIAGNOSIS — I10 Essential (primary) hypertension: Secondary | ICD-10-CM | POA: Diagnosis not present

## 2015-09-21 DIAGNOSIS — E785 Hyperlipidemia, unspecified: Secondary | ICD-10-CM | POA: Diagnosis not present

## 2015-09-21 DIAGNOSIS — L03116 Cellulitis of left lower limb: Secondary | ICD-10-CM | POA: Diagnosis not present

## 2015-09-21 DIAGNOSIS — C9000 Multiple myeloma not having achieved remission: Secondary | ICD-10-CM | POA: Diagnosis not present

## 2015-09-21 DIAGNOSIS — Z7984 Long term (current) use of oral hypoglycemic drugs: Secondary | ICD-10-CM | POA: Diagnosis not present

## 2015-09-21 DIAGNOSIS — Z792 Long term (current) use of antibiotics: Secondary | ICD-10-CM | POA: Diagnosis not present

## 2015-09-21 LAB — COMPREHENSIVE METABOLIC PANEL
ALBUMIN: 4 g/dL (ref 3.6–5.1)
ALT: 15 U/L (ref 6–29)
AST: 20 U/L (ref 10–35)
Alkaline Phosphatase: 58 U/L (ref 33–130)
BUN: 33 mg/dL — ABNORMAL HIGH (ref 7–25)
CALCIUM: 9.7 mg/dL (ref 8.6–10.4)
CHLORIDE: 104 mmol/L (ref 98–110)
CO2: 24 mmol/L (ref 20–31)
Creat: 1.07 mg/dL — ABNORMAL HIGH (ref 0.60–0.93)
Glucose, Bld: 106 mg/dL — ABNORMAL HIGH (ref 65–99)
Potassium: 3.9 mmol/L (ref 3.5–5.3)
SODIUM: 139 mmol/L (ref 135–146)
Total Bilirubin: 0.3 mg/dL (ref 0.2–1.2)
Total Protein: 6.3 g/dL (ref 6.1–8.1)

## 2015-09-21 LAB — CBC WITH DIFFERENTIAL/PLATELET
BASOS PCT: 0 % (ref 0–1)
Basophils Absolute: 0 10*3/uL (ref 0.0–0.1)
EOS ABS: 0 10*3/uL (ref 0.0–0.7)
EOS PCT: 1 % (ref 0–5)
HCT: 32.7 % — ABNORMAL LOW (ref 36.0–46.0)
Hemoglobin: 10.5 g/dL — ABNORMAL LOW (ref 12.0–15.0)
Lymphocytes Relative: 6 % — ABNORMAL LOW (ref 12–46)
Lymphs Abs: 0.3 10*3/uL — ABNORMAL LOW (ref 0.7–4.0)
MCH: 30.1 pg (ref 26.0–34.0)
MCHC: 32.1 g/dL (ref 30.0–36.0)
MCV: 93.7 fL (ref 78.0–100.0)
MONO ABS: 0.5 10*3/uL (ref 0.1–1.0)
MPV: 9.1 fL (ref 8.6–12.4)
Monocytes Relative: 12 % (ref 3–12)
Neutro Abs: 3.4 10*3/uL (ref 1.7–7.7)
Neutrophils Relative %: 81 % — ABNORMAL HIGH (ref 43–77)
PLATELETS: 480 10*3/uL — AB (ref 150–400)
RBC: 3.49 MIL/uL — AB (ref 3.87–5.11)
RDW: 15.5 % (ref 11.5–15.5)
WBC: 4.2 10*3/uL (ref 4.0–10.5)

## 2015-09-21 MED ORDER — OXYCODONE HCL 5 MG PO TABS
10.0000 mg | ORAL_TABLET | Freq: Four times a day (QID) | ORAL | Status: DC | PRN
Start: 2015-09-21 — End: 2015-10-08

## 2015-09-21 NOTE — Patient Instructions (Signed)
Use the pain medicine as needed.  I'll check with your other docs in the meantime.  Go to the lab on the way out.  We'll contact you with your lab report. If you have progressive changes on your foot this weekend, then go to the ER.  Keep it clean and covered for now . Take care.  Glad to see you.

## 2015-09-21 NOTE — Telephone Encounter (Signed)
Per staff messge to chemo scheduler this weeks tx delayed r/s to next week. Patient already on schedule for tx next week. Checked with desk to see when patient should have f/u and per desk nurse patient will need f/u next week - 1/23 for lab/[facility] ok. Left message for dtr [name] re add on lab/fu for 1/23. Other appointments remain the same for - other appointments confirmed.

## 2015-09-21 NOTE — Telephone Encounter (Signed)
Aimee, nurse at [facility] evaluated pt and left foot, second and third toe are necrotic and forth toe has an open draining wound in between toes.  Aimee would like wound care orders and orders for the nurse to come out at least once or twice a week.   Best number to call Aimee is

## 2015-09-21 NOTE — Telephone Encounter (Signed)
Left detailed message on voicemail.  

## 2015-09-21 NOTE — Telephone Encounter (Addendum)
Daily dressing change with nonstick bandage.  Change more frequently if needed, if bandage is soiled.   Please give order for RN to eval.  If fever, more pain, spreading erythema progression of the wound over the weekend, then needs ER eval.   Will see today at [facility].  Thanks.

## 2015-09-21 NOTE — Progress Notes (Signed)
Pre visit review using our clinic review tool, if applicable. No additional management support is needed unless otherwise documented below in the visit note.  Hospital f/u.   Admit date: 09/09/2015 Discharge date: 09/14/2015  Time spent: 45 minutes  Recommendations for Outpatient Follow-up:  1. PCP Dr.DUncan in 1 week, needs INR rechecked in 1 week 2. Dr.DIckson , VVS, on 1/25 at 8:30am, needs further discussion for Amputation as needed, depending on evolution of dry gangrene or toes of L foot 3. Dr.Mohamed next week, due for next cycle of Chemo for myeloma   Discharge Diagnoses:  Principal Problem:  Cellulitis   Dry Gangrene   Multiple myeloma ([place])  Diabetes mellitus without complication ([place])  Hyperlipidemia  Essential hypertension  Long term current use of anticoagulant therapy  PVD (peripheral vascular disease) ([place])  Dry gangrene ([place])   Discharge Condition: stable  Diet recommendation: DIabetic, heart healthy  Filed Weights   09/09/15 2110  Weight: 53.7 kg (118 lb 6.2 oz)    History of present illness:  Chief Complaint: Increasing Redness and pain in Left Foot HPI: [name] is a 80 y.o. 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 She had been seen by Vascular Dr. Delana Meyer in [place] on 12/28 and had an aortogram and stenting of posterior tibial artery due to ischemia of 2nd and 3rd toes of the left foot.  Hospital Course:  Cellulitis of left foot with ischemic changes to the second and third toes in the setting of PVD and severe tibial arterial occlusive disease - MRI negative for osteomyelitis. - Was being Rx for cellulitis, at this time do not see evidence of cellulitis anymore, except for gangrenous changes, has been on Empiric  IV Vancomycin for 5days changed to PO Doxycyline 1/13 - Blood cx negative - Status post evaluation by Vascular Dr.Dickson, he felt that she had severe tibial artery occlusive disease and did undergo successful angioplasty of disease in her posterior tibial artery by surgeon in [place], he felt that there are no further options for revascularization, it was felt that if the toes do not heal and she should need a below-the-knee amputation, he recommended discharge on po antibiotics the wounds could be followed as an outpatient.  -Given severe pain, i recommended considering amputation/surigical eval again now(1/13), she refuses consideration of amputation toes or BKA at this time, called and discussed with daughter who is in agreement, they want to FU with Dr.DIckson, i called his office and requested an appt, FU made for 1/25 at 8:30am -i will increase her oxycodone to '10mg'$  Q6 from '5mg'$  and ask PT to re-evaluate to see if she needs rehab  Active Problems:  Multiple myeloma ([place]) - Currently undergoing tx with Carfilzomib, Cytoxan and Decadron, will start cycle #14 09/18/15.  -followed by Dr.Mohamed   Diabetes mellitus without complication ([place]) - Was on SSI inpatient -Hbaic was 6.8, resume GLipizide at discharge   Essential hypertension - Continue lisinopril/HCTZ.   Hypokalemia - due to [place], replace.   PVD (peripheral vascular disease) ([place]) with ischemic toes - Continue Plavix. History of angioplasty of posterior tibial artery. - see discussion above   History of DVTs/long-term use of anticoagulant therapy - Continue Coumadin, INR 2.5 at discharge     -------- The above d/w pt and family.  L foot with gangrene on the 2nd and 3rd toes, with vascular f/u pending.  MRI w/o osteo, currently on abx.  Still in pain, with oxycodone dosed '10mg'$  QID.  Some relief from pain, but still with sig pain overall.  No ADE on meds.  Has f/u with onc and surgery pending.  D/w pt about course  and possible surgery.  She hasn't had foot surgery done yet.    No fevers.  Still with L leg pain at baseline.  Eating fairly well.  Has family looking after her at home.  Due for labs.  Still on coumadin.  No bleeding.   Not weight bearing on L foot at OV.   PMH and SH reviewed  ROS: See HPI, otherwise noncontributory.  Meds, vitals, and allergies reviewed.   nad In wheelchair, at baseline ncat Mmm Neck supple rrr ctab abd soft Ext w/o edema L foot with gangrenous/black 2nd and 3rd toes.  No ulceration.  She has some irritation proximal at the border with the viable tissue near the MTP joints.  The skin is macerated some but doesn't appear to have a frank ulcer.   She has a smaller irritated area on the L posterior heel, not on the plantar side but just distal to the achilles w/o ulceration.

## 2015-09-22 LAB — PROTIME-INR
INR: 3.16 — AB (ref <1.50)
Prothrombin Time: 32.9 seconds — ABNORMAL HIGH (ref 11.6–15.2)

## 2015-09-23 NOTE — Assessment & Plan Note (Addendum)
We talked about likely needing surgery at some point.  In meantime, will up her oxycodone to 10-12.'5mg'$  QID prn pain.  If pain is better controlled, she can always take less.  Check labs today and will notify oncology.  This may alleviate her need for labs at [facility] on Monday.  I didn't change her coumadin dosing at the OV.  She agrees with plan.  Family agrees with plan.  L foot redressed today with nonstick bandage and roll gauze.   If progressive foot changes over the weekend, then to ER.  She agrees.

## 2015-09-24 ENCOUNTER — Ambulatory Visit: Payer: Medicare Other | Admitting: Oncology

## 2015-09-24 ENCOUNTER — Other Ambulatory Visit: Payer: Medicare Other

## 2015-09-24 DIAGNOSIS — I872 Venous insufficiency (chronic) (peripheral): Secondary | ICD-10-CM | POA: Diagnosis not present

## 2015-09-24 DIAGNOSIS — Z7984 Long term (current) use of oral hypoglycemic drugs: Secondary | ICD-10-CM | POA: Diagnosis not present

## 2015-09-24 DIAGNOSIS — L03116 Cellulitis of left lower limb: Secondary | ICD-10-CM | POA: Diagnosis not present

## 2015-09-24 DIAGNOSIS — Z7901 Long term (current) use of anticoagulants: Secondary | ICD-10-CM | POA: Diagnosis not present

## 2015-09-24 DIAGNOSIS — L97529 Non-pressure chronic ulcer of other part of left foot with unspecified severity: Secondary | ICD-10-CM | POA: Diagnosis not present

## 2015-09-24 DIAGNOSIS — E785 Hyperlipidemia, unspecified: Secondary | ICD-10-CM | POA: Diagnosis not present

## 2015-09-24 DIAGNOSIS — C9000 Multiple myeloma not having achieved remission: Secondary | ICD-10-CM | POA: Diagnosis not present

## 2015-09-24 DIAGNOSIS — Z792 Long term (current) use of antibiotics: Secondary | ICD-10-CM | POA: Diagnosis not present

## 2015-09-24 DIAGNOSIS — Z79891 Long term (current) use of opiate analgesic: Secondary | ICD-10-CM | POA: Diagnosis not present

## 2015-09-24 DIAGNOSIS — E0952 Drug or chemical induced diabetes mellitus with diabetic peripheral angiopathy with gangrene: Secondary | ICD-10-CM | POA: Diagnosis not present

## 2015-09-24 DIAGNOSIS — I10 Essential (primary) hypertension: Secondary | ICD-10-CM | POA: Diagnosis not present

## 2015-09-25 ENCOUNTER — Ambulatory Visit: Payer: Medicare Other

## 2015-09-25 ENCOUNTER — Telehealth: Payer: Self-pay | Admitting: Medical Oncology

## 2015-09-25 ENCOUNTER — Other Ambulatory Visit: Payer: Medicare Other

## 2015-09-25 DIAGNOSIS — S91105A Unspecified open wound of left lesser toe(s) without damage to nail, initial encounter: Secondary | ICD-10-CM | POA: Diagnosis not present

## 2015-09-25 NOTE — Telephone Encounter (Signed)
I spoke to [name] and son in lobby and called [facility]. I told them that Dr Julien Nordmann is holding her treatment now until the toe problem is taken care of.

## 2015-09-26 ENCOUNTER — Encounter: Payer: Self-pay | Admitting: Vascular Surgery

## 2015-09-26 ENCOUNTER — Ambulatory Visit (INDEPENDENT_AMBULATORY_CARE_PROVIDER_SITE_OTHER): Payer: Medicare Other | Admitting: Vascular Surgery

## 2015-09-26 ENCOUNTER — Ambulatory Visit: Payer: Medicare Other

## 2015-09-26 ENCOUNTER — Telehealth: Payer: Self-pay | Admitting: *Deleted

## 2015-09-26 VITALS — BP 138/70 | HR 78 | Temp 98.7°F | Ht 60.0 in | Wt 112.0 lb

## 2015-09-26 DIAGNOSIS — I70262 Atherosclerosis of native arteries of extremities with gangrene, left leg: Secondary | ICD-10-CM

## 2015-09-26 MED ORDER — CEPHALEXIN 500 MG PO CAPS: 500.0000 mg | ORAL_CAPSULE | Freq: Three times a day (TID) | ORAL | Status: DC

## 2015-09-26 NOTE — Telephone Encounter (Signed)
Call from pt daughter, who advised Dr. Glory Rosebush has said it is ok for pt to continue with chemotherapy. Pt has appt at 3pm today. Reviewed with MD. Returned call to pt and instructed pt she will need treatment 2 days in a row.  Pt daughter verbalized they will continue with treatment on 1/31 and skip this week as treatment was missed 1/24. No further concerns.

## 2015-09-26 NOTE — Telephone Encounter (Signed)
Patient's daughter called to verify that today's appt in canceled. I have called her back and left message, stated appts canceled for today.

## 2015-09-26 NOTE — Progress Notes (Signed)
Patient name: [name] MRN: [id number] DOB: June 30, 1936 Sex: female  REASON FOR VISIT: follow up of peripheral vascular disease and nonhealing wound of the left foot.  HPI: [name] is a 80 y.o. female who I saw in consultation at [facility] with a nonhealing wound of the left foot. She had dry gangrene of the left second toe and was seen by the vascular surgeons in [facility].The patient underwent an arteriogram on 08/14/2015. I am unable to view these images but I do have the report which describes no significant disease down through the popliteal artery on the left. However, she had severe tibial artery occlusive disease. The peroneal artery Was severely diseased and occluded distally. The anterior tibial artery was occluded with no reconstitution distally. The posterior tibial artery had several stenoses which were successfully ballooned. According to the family were no further options for revascularization. When I saw her there she had brisk flow in her posterior tibial artery on the left side suspect the intervention was 8 and. I explained that it was nothing further to offer from a vascular standpoint and that she should follow up with the wound care center. I recommended keeping dry dressings on the toes in the left foot and soaking the foot daily and Luke warm dilute soap soaks. She comes in for a follow up visit.  Since I saw her as a consult in the hospital. The wounds on her left foot have progressed. She now has extensive dry gangrene involving the second and third toes. He denies fever or chills. She does have pain in the foot related to the wound.  Current Outpatient Prescriptions  Medication Sig Dispense Refill  . acyclovir (ZOVIRAX) 400 MG tablet TAKE ONE TABLET BY MOUTH TWICE DAILY 60 tablet 0  . ALPRAZolam (XANAX) 0.5 MG tablet TAKE ONE TABLET BY MOUTH TWICE DAILY AS NEEDED FOR ANXIETY (Patient taking differently: TAKE 0.'25mg'$  to 0.'5mg'$  BY MOUTH  TWICE DAILY AS NEEDED FOR ANXIETY) 60 tablet 0  . amLODipine (NORVASC) 5 MG tablet Take 1 tablet (5 mg total) by mouth daily.    . Calcium Carbonate-Vitamin D 600-400 MG-UNIT per tablet Take 1 tablet by mouth.     . clopidogrel (PLAVIX) 75 MG tablet Take 1 tablet (75 mg total) by mouth daily. 30 tablet 11  . dexamethasone (DECADRON) 4 MG tablet TAKE 10 TABS BY MOUTH WEEKLY ON DAY 1 WITH CHEMOTHERAPY TREATMENT CARFILZOMIB 40 tablet 0  . glipiZIDE (GLUCOTROL XL) 2.5 MG 24 hr tablet TAKE ONE TABLET BY MOUTH ONCE DAILY 90 tablet 1  . lisinopril-hydrochlorothiazide (PRINZIDE,ZESTORETIC) 20-12.5 MG per tablet Take 1 tablet by mouth daily. 90 tablet 3  . loperamide (IMODIUM A-D) 2 MG tablet Take 1 tablet (2 mg total) by mouth 4 (four) times daily as needed for diarrhea or loose stools.    Marland Kitchen oxyCODONE (OXY IR/ROXICODONE) 5 MG immediate release tablet Take 2-2.5 tablets (10-12.5 mg total) by mouth every 6 (six) hours as needed for severe pain. 50 tablet 0  . potassium chloride SA (K-DUR,KLOR-CON) 20 MEQ tablet Take 1 tablet (20 mEq total) by mouth daily. 30 tablet 0  . prochlorperazine (COMPAZINE) 10 MG tablet Take 1 tablet (10 mg total) by mouth every 6 (six) hours as needed. 30 tablet 0  . temazepam (RESTORIL) 30 MG capsule Take 1 capsule (30 mg total) by mouth at bedtime as needed. 30 capsule 0  . warfarin (COUMADIN) 5 MG tablet Take 1 tablet (5 mg total) by mouth  daily at 6 PM.  0  . doxycycline (VIBRA-TABS) 100 MG tablet Take 1 tablet (100 mg total) by mouth every 12 (twelve) hours. For 10days (Patient not taking: Reported on 09/26/2015) 20 tablet 0   No current facility-administered medications for this visit.   Facility-Administered Medications Ordered in Other Visits  Medication Dose Route Frequency Provider Last Rate Last Dose  . 0.9 %  sodium chloride infusion   Intravenous Once Curt Bears, MD   Stopped at 03/21/15 1720  . 0.9 %  sodium chloride infusion   Intravenous Once Curt Bears,  MD      . 0.9 %  sodium chloride infusion   Intravenous Once Curt Bears, MD   Stopped at 07/31/15 1638    REVIEW OF SYSTEMS:  '[X]'$  denotes positive finding, '[ ]'$  denotes negative finding Cardiac  Comments:  Chest pain or chest pressure:    Shortness of breath upon exertion:    Short of breath when lying flat:    Irregular heart rhythm:    Constitutional    Fever or chills:    She does have rest pain of the left foot. She also notes some swelling on the left foot. PHYSICAL EXAM: Filed Vitals:   09/26/15 0900  BP: 138/70  Pulse: 78  Temp: 98.7 F (37.1 C)  TempSrc: Oral  Height: 5' (1.524 m)  Weight: 112 lb (50.803 kg)  SpO2: 99%    GENERAL: The patient is a well-nourished female, in no acute distress. The vital signs are documented above. CARDIOVASCULAR: There is a regular rate and rhythm. PULMONARY: There is good air exchange bilaterally without wheezing or rales. On the left side, she has a palpable femoral and popliteal pulse. She has a brisk posterior tibial signal with the Doppler and a brisk anterior tibial signal, but a dampened monophasic dorsalis pedis signal. His extensive dry gangrene of the left second and third toes.  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 Wounds on the left foot are not healing. I have recommended that we either proceed with a primary below the knee amputation or we could attempt transmetatarsal amputation with a 50% chance of healing. Main concern is her distal disease in her foot. The patient will discuss this with her family and decide which option she prefers. In the meantime I have recommended she soak her foot daily and Luke warm dilute soap soaks. I have written her a prescription for Keflex. She will also keep dry gauze between the toes.  [name] [facility] [place]: [phone]

## 2015-09-27 DIAGNOSIS — Z79891 Long term (current) use of opiate analgesic: Secondary | ICD-10-CM | POA: Diagnosis not present

## 2015-09-27 DIAGNOSIS — C9000 Multiple myeloma not having achieved remission: Secondary | ICD-10-CM | POA: Diagnosis not present

## 2015-09-27 DIAGNOSIS — I10 Essential (primary) hypertension: Secondary | ICD-10-CM | POA: Diagnosis not present

## 2015-09-27 DIAGNOSIS — L97529 Non-pressure chronic ulcer of other part of left foot with unspecified severity: Secondary | ICD-10-CM | POA: Diagnosis not present

## 2015-09-27 DIAGNOSIS — L03116 Cellulitis of left lower limb: Secondary | ICD-10-CM | POA: Diagnosis not present

## 2015-09-27 DIAGNOSIS — I872 Venous insufficiency (chronic) (peripheral): Secondary | ICD-10-CM | POA: Diagnosis not present

## 2015-09-27 DIAGNOSIS — E0952 Drug or chemical induced diabetes mellitus with diabetic peripheral angiopathy with gangrene: Secondary | ICD-10-CM | POA: Diagnosis not present

## 2015-09-27 DIAGNOSIS — Z7901 Long term (current) use of anticoagulants: Secondary | ICD-10-CM | POA: Diagnosis not present

## 2015-09-27 DIAGNOSIS — Z7984 Long term (current) use of oral hypoglycemic drugs: Secondary | ICD-10-CM | POA: Diagnosis not present

## 2015-09-27 DIAGNOSIS — E785 Hyperlipidemia, unspecified: Secondary | ICD-10-CM | POA: Diagnosis not present

## 2015-09-27 DIAGNOSIS — Z792 Long term (current) use of antibiotics: Secondary | ICD-10-CM | POA: Diagnosis not present

## 2015-09-28 DIAGNOSIS — C9000 Multiple myeloma not having achieved remission: Secondary | ICD-10-CM | POA: Diagnosis not present

## 2015-09-28 DIAGNOSIS — I10 Essential (primary) hypertension: Secondary | ICD-10-CM | POA: Diagnosis not present

## 2015-09-28 DIAGNOSIS — L03116 Cellulitis of left lower limb: Secondary | ICD-10-CM | POA: Diagnosis not present

## 2015-09-28 DIAGNOSIS — I872 Venous insufficiency (chronic) (peripheral): Secondary | ICD-10-CM | POA: Diagnosis not present

## 2015-09-28 DIAGNOSIS — Z79891 Long term (current) use of opiate analgesic: Secondary | ICD-10-CM | POA: Diagnosis not present

## 2015-09-28 DIAGNOSIS — Z792 Long term (current) use of antibiotics: Secondary | ICD-10-CM | POA: Diagnosis not present

## 2015-09-28 DIAGNOSIS — L97529 Non-pressure chronic ulcer of other part of left foot with unspecified severity: Secondary | ICD-10-CM | POA: Diagnosis not present

## 2015-09-28 DIAGNOSIS — E0952 Drug or chemical induced diabetes mellitus with diabetic peripheral angiopathy with gangrene: Secondary | ICD-10-CM | POA: Diagnosis not present

## 2015-09-28 DIAGNOSIS — Z7901 Long term (current) use of anticoagulants: Secondary | ICD-10-CM | POA: Diagnosis not present

## 2015-09-28 DIAGNOSIS — E785 Hyperlipidemia, unspecified: Secondary | ICD-10-CM | POA: Diagnosis not present

## 2015-09-28 DIAGNOSIS — Z7984 Long term (current) use of oral hypoglycemic drugs: Secondary | ICD-10-CM | POA: Diagnosis not present

## 2015-10-01 DIAGNOSIS — I10 Essential (primary) hypertension: Secondary | ICD-10-CM | POA: Diagnosis not present

## 2015-10-01 DIAGNOSIS — L03116 Cellulitis of left lower limb: Secondary | ICD-10-CM | POA: Diagnosis not present

## 2015-10-01 DIAGNOSIS — I872 Venous insufficiency (chronic) (peripheral): Secondary | ICD-10-CM | POA: Diagnosis not present

## 2015-10-01 DIAGNOSIS — Z7984 Long term (current) use of oral hypoglycemic drugs: Secondary | ICD-10-CM | POA: Diagnosis not present

## 2015-10-01 DIAGNOSIS — L97529 Non-pressure chronic ulcer of other part of left foot with unspecified severity: Secondary | ICD-10-CM | POA: Diagnosis not present

## 2015-10-01 DIAGNOSIS — Z792 Long term (current) use of antibiotics: Secondary | ICD-10-CM | POA: Diagnosis not present

## 2015-10-01 DIAGNOSIS — E0952 Drug or chemical induced diabetes mellitus with diabetic peripheral angiopathy with gangrene: Secondary | ICD-10-CM | POA: Diagnosis not present

## 2015-10-01 DIAGNOSIS — C9000 Multiple myeloma not having achieved remission: Secondary | ICD-10-CM | POA: Diagnosis not present

## 2015-10-01 DIAGNOSIS — E785 Hyperlipidemia, unspecified: Secondary | ICD-10-CM | POA: Diagnosis not present

## 2015-10-01 DIAGNOSIS — Z79891 Long term (current) use of opiate analgesic: Secondary | ICD-10-CM | POA: Diagnosis not present

## 2015-10-01 DIAGNOSIS — Z7901 Long term (current) use of anticoagulants: Secondary | ICD-10-CM | POA: Diagnosis not present

## 2015-10-02 ENCOUNTER — Telehealth: Payer: Self-pay | Admitting: Medical Oncology

## 2015-10-02 ENCOUNTER — Telehealth: Payer: Self-pay

## 2015-10-02 ENCOUNTER — Ambulatory Visit: Payer: Medicare Other

## 2015-10-02 ENCOUNTER — Other Ambulatory Visit: Payer: Self-pay | Admitting: Internal Medicine

## 2015-10-02 ENCOUNTER — Other Ambulatory Visit: Payer: Self-pay | Admitting: *Deleted

## 2015-10-02 ENCOUNTER — Other Ambulatory Visit: Payer: Medicare Other

## 2015-10-02 DIAGNOSIS — I825Z2 Chronic embolism and thrombosis of unspecified deep veins of left distal lower extremity: Secondary | ICD-10-CM

## 2015-10-02 MED ORDER — ENOXAPARIN SODIUM 80 MG/0.8ML ~~LOC~~ SOLN: 1.5000 mg/kg | SUBCUTANEOUS | Status: DC

## 2015-10-02 NOTE — Telephone Encounter (Signed)
[name] called about if pt needs to come in today and tomorrow for her chemo. Pt is scheduled for BKA on 2/7. Per Dr Julien Nordmann she does not. appts were cancelled in computer. [name] had question about stopping/bridging the warfarin for the surgery. The surgeon is waiting for these instructions.

## 2015-10-02 NOTE — Telephone Encounter (Signed)
Line busy

## 2015-10-02 NOTE — Telephone Encounter (Signed)
I called Zigmund Daniel back at Dr Scot Dock and Julien Nordmann said okay to stop coumadin 5 days prior to surgery for AKA ( 10/09/15). Pt will need lovenox bridge.

## 2015-10-02 NOTE — Telephone Encounter (Addendum)
Dr Scot Dock office notified [name]), and [name] will order lovenox bridge to [facility]

## 2015-10-03 ENCOUNTER — Ambulatory Visit: Payer: Medicare Other

## 2015-10-03 ENCOUNTER — Telehealth: Payer: Self-pay | Admitting: Internal Medicine

## 2015-10-03 ENCOUNTER — Other Ambulatory Visit: Payer: Self-pay | Admitting: Medical Oncology

## 2015-10-03 NOTE — Progress Notes (Signed)
lovenox teaching appt is for tomorrow 2/2 [name] notified.

## 2015-10-03 NOTE — Telephone Encounter (Signed)
Appointment made per 2/1 pof. Patient aware of date/time

## 2015-10-03 NOTE — Telephone Encounter (Signed)
[name] notified re lovenox bridge to start tomorrow through feb 6th . She has not given an injection before so I sent ONC tx request for her to come tomorrow to learn injection.

## 2015-10-04 ENCOUNTER — Telehealth: Payer: Self-pay | Admitting: Family Medicine

## 2015-10-04 ENCOUNTER — Ambulatory Visit: Payer: Medicare Other

## 2015-10-04 ENCOUNTER — Other Ambulatory Visit: Payer: Self-pay | Admitting: *Deleted

## 2015-10-04 DIAGNOSIS — Z792 Long term (current) use of antibiotics: Secondary | ICD-10-CM | POA: Diagnosis not present

## 2015-10-04 DIAGNOSIS — E0952 Drug or chemical induced diabetes mellitus with diabetic peripheral angiopathy with gangrene: Secondary | ICD-10-CM | POA: Diagnosis not present

## 2015-10-04 DIAGNOSIS — I10 Essential (primary) hypertension: Secondary | ICD-10-CM | POA: Diagnosis not present

## 2015-10-04 DIAGNOSIS — L03116 Cellulitis of left lower limb: Secondary | ICD-10-CM | POA: Diagnosis not present

## 2015-10-04 DIAGNOSIS — Z7984 Long term (current) use of oral hypoglycemic drugs: Secondary | ICD-10-CM | POA: Diagnosis not present

## 2015-10-04 DIAGNOSIS — Z7901 Long term (current) use of anticoagulants: Secondary | ICD-10-CM | POA: Diagnosis not present

## 2015-10-04 DIAGNOSIS — C9000 Multiple myeloma not having achieved remission: Secondary | ICD-10-CM | POA: Diagnosis not present

## 2015-10-04 DIAGNOSIS — I872 Venous insufficiency (chronic) (peripheral): Secondary | ICD-10-CM | POA: Diagnosis not present

## 2015-10-04 DIAGNOSIS — E785 Hyperlipidemia, unspecified: Secondary | ICD-10-CM | POA: Diagnosis not present

## 2015-10-04 DIAGNOSIS — Z79891 Long term (current) use of opiate analgesic: Secondary | ICD-10-CM | POA: Diagnosis not present

## 2015-10-04 DIAGNOSIS — L97529 Non-pressure chronic ulcer of other part of left foot with unspecified severity: Secondary | ICD-10-CM | POA: Diagnosis not present

## 2015-10-04 MED ORDER — OXYCODONE HCL 5 MG PO TABS: ORAL_TABLET | ORAL | Status: DC

## 2015-10-04 NOTE — Telephone Encounter (Signed)
[name] with [facility]  Pt has missed her visit today for OT  cb number [phone]

## 2015-10-05 ENCOUNTER — Other Ambulatory Visit: Payer: Self-pay | Admitting: *Deleted

## 2015-10-05 DIAGNOSIS — C9000 Multiple myeloma not having achieved remission: Secondary | ICD-10-CM

## 2015-10-05 MED ORDER — OXYCODONE HCL 5 MG PO TABS: ORAL_TABLET | ORAL | Status: DC

## 2015-10-05 NOTE — Telephone Encounter (Signed)
MD out of office, provider changed to Ned Card for [place]. Pt notified ready for pick up

## 2015-10-08 ENCOUNTER — Encounter (HOSPITAL_COMMUNITY): Payer: Self-pay | Admitting: *Deleted

## 2015-10-08 ENCOUNTER — Telehealth: Payer: Self-pay | Admitting: Medical Oncology

## 2015-10-08 DIAGNOSIS — E0952 Drug or chemical induced diabetes mellitus with diabetic peripheral angiopathy with gangrene: Secondary | ICD-10-CM | POA: Diagnosis not present

## 2015-10-08 DIAGNOSIS — Z7901 Long term (current) use of anticoagulants: Secondary | ICD-10-CM | POA: Diagnosis not present

## 2015-10-08 DIAGNOSIS — Z792 Long term (current) use of antibiotics: Secondary | ICD-10-CM | POA: Diagnosis not present

## 2015-10-08 DIAGNOSIS — L03116 Cellulitis of left lower limb: Secondary | ICD-10-CM | POA: Diagnosis not present

## 2015-10-08 DIAGNOSIS — Z7984 Long term (current) use of oral hypoglycemic drugs: Secondary | ICD-10-CM | POA: Diagnosis not present

## 2015-10-08 DIAGNOSIS — E785 Hyperlipidemia, unspecified: Secondary | ICD-10-CM | POA: Diagnosis not present

## 2015-10-08 DIAGNOSIS — I872 Venous insufficiency (chronic) (peripheral): Secondary | ICD-10-CM | POA: Diagnosis not present

## 2015-10-08 DIAGNOSIS — Z79891 Long term (current) use of opiate analgesic: Secondary | ICD-10-CM | POA: Diagnosis not present

## 2015-10-08 DIAGNOSIS — C9000 Multiple myeloma not having achieved remission: Secondary | ICD-10-CM | POA: Diagnosis not present

## 2015-10-08 DIAGNOSIS — L97529 Non-pressure chronic ulcer of other part of left foot with unspecified severity: Secondary | ICD-10-CM | POA: Diagnosis not present

## 2015-10-08 DIAGNOSIS — I10 Essential (primary) hypertension: Secondary | ICD-10-CM | POA: Diagnosis not present

## 2015-10-08 MED ORDER — CHLORHEXIDINE GLUCONATE CLOTH 2 % EX PADS: 6.0000 | MEDICATED_PAD | Freq: Once | CUTANEOUS | Status: DC

## 2015-10-08 MED ORDER — CEFUROXIME SODIUM 1.5 G IJ SOLR
1.5000 g | INTRAMUSCULAR | Status: AC
  Administered 2015-10-09: 1.5 g via INTRAVENOUS
  Filled 2015-10-08: qty 1.5

## 2015-10-08 MED ORDER — SODIUM CHLORIDE 0.9 % IV SOLN: INTRAVENOUS | Status: DC

## 2015-10-08 NOTE — Anesthesia Preprocedure Evaluation (Addendum)
Anesthesia Evaluation  Patient identified by MRN, date of birth, ID band Patient awake    Reviewed: Allergy & Precautions, H&P , NPO status , Patient's Chart, lab work & pertinent test results  Airway Mallampati: II  TM Distance: >3 FB Neck ROM: full    Dental  (+) Dental Advisory Given, Edentulous Upper, Edentulous Lower   Pulmonary neg pulmonary ROS, former smoker,    Pulmonary exam normal breath sounds clear to auscultation       Cardiovascular Exercise Tolerance: Good hypertension, Pt. on medications + Peripheral Vascular Disease  negative cardio ROS Normal cardiovascular exam Rhythm:regular Rate:Normal  Cardiomyopathy. Sinus bradycardia   Neuro/Psych negative neurological ROS  negative psych ROS   GI/Hepatic negative GI ROS, Neg liver ROS,   Endo/Other  negative endocrine ROSdiabetes, Well Controlled, Type 2, Oral Hypoglycemic Agents  Renal/GU negative Renal ROS  negative genitourinary   Musculoskeletal   Abdominal   Peds  Hematology negative hematology ROS (+)   Anesthesia Other Findings Multiple myeloma  Reproductive/Obstetrics negative OB ROS                            Anesthesia Physical Anesthesia Plan  ASA: III  Anesthesia Plan: General   Post-op Pain Management:    Induction: Intravenous  Airway Management Planned: LMA  Additional Equipment:   Intra-op Plan:   Post-operative Plan:   Informed Consent: I have reviewed the patients History and Physical, chart, labs and discussed the procedure including the risks, benefits and alternatives for the proposed anesthesia with the patient or authorized representative who has indicated his/her understanding and acceptance.   Dental Advisory Given  Plan Discussed with: CRNA and Surgeon  Anesthesia Plan Comments:         Anesthesia Quick Evaluation

## 2015-10-08 NOTE — Telephone Encounter (Signed)
I called [name] to pick up oxycodone rx and she said she picked one up on 10/05/15. Duplicate rx shredded.

## 2015-10-08 NOTE — Progress Notes (Addendum)
Spoke with pt's daughter, [name] for pre-op call. She states pt does not have cardiac history and has not complained of chest pain or sob. She states fasting blood sugar usually runs around 120. Last A1C was 6.8 on 09/10/15.

## 2015-10-09 ENCOUNTER — Encounter (HOSPITAL_COMMUNITY)
Admission: RE | Disposition: A | Discharge status: Rehabilitation Facility | Payer: Self-pay | Source: Ambulatory Visit | Attending: Vascular Surgery

## 2015-10-09 ENCOUNTER — Inpatient Hospital Stay (HOSPITAL_COMMUNITY): Payer: Medicare Other | Admitting: Certified Registered Nurse Anesthetist

## 2015-10-09 ENCOUNTER — Other Ambulatory Visit: Payer: Self-pay | Admitting: *Deleted

## 2015-10-09 ENCOUNTER — Inpatient Hospital Stay (HOSPITAL_COMMUNITY)
Admission: RE | Admit: 2015-10-09 | Discharge: 2015-10-12 | DRG: 240 | Disposition: A | Discharge status: Rehabilitation Facility | Payer: Medicare Other | Source: Ambulatory Visit | Attending: Vascular Surgery | Admitting: Vascular Surgery

## 2015-10-09 ENCOUNTER — Encounter (HOSPITAL_COMMUNITY): Payer: Self-pay | Admitting: Certified Registered Nurse Anesthetist

## 2015-10-09 DIAGNOSIS — I10 Essential (primary) hypertension: Secondary | ICD-10-CM | POA: Diagnosis not present

## 2015-10-09 DIAGNOSIS — E0952 Drug or chemical induced diabetes mellitus with diabetic peripheral angiopathy with gangrene: Secondary | ICD-10-CM | POA: Diagnosis not present

## 2015-10-09 DIAGNOSIS — Z7902 Long term (current) use of antithrombotics/antiplatelets: Secondary | ICD-10-CM

## 2015-10-09 DIAGNOSIS — Z9221 Personal history of antineoplastic chemotherapy: Secondary | ICD-10-CM

## 2015-10-09 DIAGNOSIS — Z7901 Long term (current) use of anticoagulants: Secondary | ICD-10-CM

## 2015-10-09 DIAGNOSIS — D62 Acute posthemorrhagic anemia: Secondary | ICD-10-CM | POA: Diagnosis not present

## 2015-10-09 DIAGNOSIS — F411 Generalized anxiety disorder: Secondary | ICD-10-CM

## 2015-10-09 DIAGNOSIS — E1142 Type 2 diabetes mellitus with diabetic polyneuropathy: Secondary | ICD-10-CM | POA: Diagnosis not present

## 2015-10-09 DIAGNOSIS — E1152 Type 2 diabetes mellitus with diabetic peripheral angiopathy with gangrene: Secondary | ICD-10-CM | POA: Diagnosis not present

## 2015-10-09 DIAGNOSIS — E11649 Type 2 diabetes mellitus with hypoglycemia without coma: Secondary | ICD-10-CM | POA: Diagnosis not present

## 2015-10-09 DIAGNOSIS — C9000 Multiple myeloma not having achieved remission: Secondary | ICD-10-CM | POA: Diagnosis present

## 2015-10-09 DIAGNOSIS — R269 Unspecified abnormalities of gait and mobility: Secondary | ICD-10-CM | POA: Diagnosis not present

## 2015-10-09 DIAGNOSIS — C9002 Multiple myeloma in relapse: Secondary | ICD-10-CM | POA: Diagnosis not present

## 2015-10-09 DIAGNOSIS — Z923 Personal history of irradiation: Secondary | ICD-10-CM

## 2015-10-09 DIAGNOSIS — Z87891 Personal history of nicotine dependence: Secondary | ICD-10-CM

## 2015-10-09 DIAGNOSIS — I429 Cardiomyopathy, unspecified: Secondary | ICD-10-CM | POA: Diagnosis present

## 2015-10-09 DIAGNOSIS — Z79899 Other long term (current) drug therapy: Secondary | ICD-10-CM

## 2015-10-09 DIAGNOSIS — G8929 Other chronic pain: Secondary | ICD-10-CM | POA: Diagnosis not present

## 2015-10-09 DIAGNOSIS — Z86718 Personal history of other venous thrombosis and embolism: Secondary | ICD-10-CM

## 2015-10-09 DIAGNOSIS — I70262 Atherosclerosis of native arteries of extremities with gangrene, left leg: Secondary | ICD-10-CM | POA: Diagnosis not present

## 2015-10-09 DIAGNOSIS — Z4781 Encounter for orthopedic aftercare following surgical amputation: Secondary | ICD-10-CM | POA: Diagnosis not present

## 2015-10-09 DIAGNOSIS — Z89512 Acquired absence of left leg below knee: Secondary | ICD-10-CM | POA: Diagnosis not present

## 2015-10-09 DIAGNOSIS — Z7984 Long term (current) use of oral hypoglycemic drugs: Secondary | ICD-10-CM

## 2015-10-09 DIAGNOSIS — I96 Gangrene, not elsewhere classified: Secondary | ICD-10-CM | POA: Diagnosis not present

## 2015-10-09 DIAGNOSIS — I739 Peripheral vascular disease, unspecified: Secondary | ICD-10-CM | POA: Diagnosis not present

## 2015-10-09 DIAGNOSIS — L97824 Non-pressure chronic ulcer of other part of left lower leg with necrosis of bone: Secondary | ICD-10-CM | POA: Diagnosis not present

## 2015-10-09 DIAGNOSIS — R2681 Unsteadiness on feet: Secondary | ICD-10-CM | POA: Diagnosis not present

## 2015-10-09 HISTORY — DX: Anxiety disorder, unspecified: F41.9

## 2015-10-09 HISTORY — DX: Anemia, unspecified: D64.9

## 2015-10-09 HISTORY — DX: Cardiac murmur, unspecified: R01.1

## 2015-10-09 HISTORY — PX: AMPUTATION: SHX166

## 2015-10-09 HISTORY — DX: Personal history of other infectious and parasitic diseases: Z86.19

## 2015-10-09 LAB — CBC
HEMATOCRIT: 33.4 % — AB (ref 36.0–46.0)
Hemoglobin: 10.3 g/dL — ABNORMAL LOW (ref 12.0–15.0)
MCH: 29.8 pg (ref 26.0–34.0)
MCHC: 30.8 g/dL (ref 30.0–36.0)
MCV: 96.5 fL (ref 78.0–100.0)
Platelets: 270 10*3/uL (ref 150–400)
RBC: 3.46 MIL/uL — AB (ref 3.87–5.11)
RDW: 15.1 % (ref 11.5–15.5)
WBC: 6.4 10*3/uL (ref 4.0–10.5)

## 2015-10-09 LAB — GLUCOSE, CAPILLARY
GLUCOSE-CAPILLARY: 144 mg/dL — AB (ref 65–99)
Glucose-Capillary: 129 mg/dL — ABNORMAL HIGH (ref 65–99)

## 2015-10-09 LAB — BASIC METABOLIC PANEL
Anion gap: 13 (ref 5–15)
BUN: 27 mg/dL — ABNORMAL HIGH (ref 6–20)
CHLORIDE: 101 mmol/L (ref 101–111)
CO2: 23 mmol/L (ref 22–32)
Calcium: 9.2 mg/dL (ref 8.9–10.3)
Creatinine, Ser: 1.04 mg/dL — ABNORMAL HIGH (ref 0.44–1.00)
GFR calc non Af Amer: 50 mL/min — ABNORMAL LOW (ref >60)
GFR, EST AFRICAN AMERICAN: 59 mL/min — AB (ref >60)
Glucose, Bld: 143 mg/dL — ABNORMAL HIGH (ref 65–99)
POTASSIUM: 4.2 mmol/L (ref 3.5–5.1)
SODIUM: 137 mmol/L (ref 135–145)

## 2015-10-09 LAB — PROTIME-INR
INR: 1.19 (ref 0.00–1.49)
Prothrombin Time: 15.3 seconds — ABNORMAL HIGH (ref 11.6–15.2)

## 2015-10-09 LAB — APTT: aPTT: 34 seconds (ref 24–37)

## 2015-10-09 SURGERY — AMPUTATION BELOW KNEE
Anesthesia: General | Site: Leg Lower | Laterality: Left

## 2015-10-09 MED ORDER — LISINOPRIL 10 MG PO TABS
20.0000 mg | ORAL_TABLET | Freq: Every day | ORAL | Status: DC
  Administered 2015-10-09 – 2015-10-12 (×4): 20 mg via ORAL
  Filled 2015-10-09 (×5): qty 2

## 2015-10-09 MED ORDER — POTASSIUM CHLORIDE CRYS ER 20 MEQ PO TBCR: 20.0000 meq | EXTENDED_RELEASE_TABLET | Freq: Every day | ORAL | Status: DC | PRN

## 2015-10-09 MED ORDER — HYDROMORPHONE HCL 1 MG/ML IJ SOLN
0.2500 mg | INTRAMUSCULAR | Status: DC | PRN
  Administered 2015-10-09: 0.5 mg via INTRAVENOUS
  Administered 2015-10-09 (×2): 0.25 mg via INTRAVENOUS

## 2015-10-09 MED ORDER — POTASSIUM CHLORIDE CRYS ER 20 MEQ PO TBCR
20.0000 meq | EXTENDED_RELEASE_TABLET | Freq: Every day | ORAL | Status: DC
  Administered 2015-10-09 – 2015-10-12 (×4): 20 meq via ORAL
  Filled 2015-10-09 (×4): qty 1

## 2015-10-09 MED ORDER — LACTATED RINGERS IV SOLN
INTRAVENOUS | Status: DC | PRN
  Administered 2015-10-09: 07:00:00 via INTRAVENOUS

## 2015-10-09 MED ORDER — CEPHALEXIN 500 MG PO CAPS
500.0000 mg | ORAL_CAPSULE | Freq: Two times a day (BID) | ORAL | Status: DC
  Administered 2015-10-09 – 2015-10-12 (×6): 500 mg via ORAL
  Filled 2015-10-09 (×6): qty 1

## 2015-10-09 MED ORDER — METOPROLOL TARTRATE 1 MG/ML IV SOLN: 2.0000 mg | INTRAVENOUS | Status: DC | PRN

## 2015-10-09 MED ORDER — AMLODIPINE BESYLATE 5 MG PO TABS
5.0000 mg | ORAL_TABLET | Freq: Every day | ORAL | Status: DC
  Administered 2015-10-09 – 2015-10-12 (×4): 5 mg via ORAL
  Filled 2015-10-09 (×4): qty 1

## 2015-10-09 MED ORDER — HYDRALAZINE HCL 20 MG/ML IJ SOLN: 5.0000 mg | INTRAMUSCULAR | Status: DC | PRN

## 2015-10-09 MED ORDER — GUAIFENESIN-DM 100-10 MG/5ML PO SYRP: 15.0000 mL | ORAL_SOLUTION | ORAL | Status: DC | PRN

## 2015-10-09 MED ORDER — LACTATED RINGERS IV SOLN: INTRAVENOUS | Status: DC

## 2015-10-09 MED ORDER — SENNOSIDES-DOCUSATE SODIUM 8.6-50 MG PO TABS: 1.0000 | ORAL_TABLET | Freq: Every evening | ORAL | Status: DC | PRN

## 2015-10-09 MED ORDER — HYDROCHLOROTHIAZIDE 12.5 MG PO CAPS
12.5000 mg | ORAL_CAPSULE | Freq: Every day | ORAL | Status: DC
  Administered 2015-10-09 – 2015-10-12 (×4): 12.5 mg via ORAL
  Filled 2015-10-09 (×4): qty 1

## 2015-10-09 MED ORDER — ALPRAZOLAM 0.5 MG PO TABS
0.5000 mg | ORAL_TABLET | Freq: Two times a day (BID) | ORAL | Status: DC | PRN
  Administered 2015-10-11 (×2): 0.5 mg via ORAL
  Filled 2015-10-09 (×2): qty 1

## 2015-10-09 MED ORDER — FENTANYL CITRATE (PF) 100 MCG/2ML IJ SOLN
INTRAMUSCULAR | Status: DC | PRN
  Administered 2015-10-09: 50 ug via INTRAVENOUS
  Administered 2015-10-09 (×3): 25 ug via INTRAVENOUS
  Administered 2015-10-09: 50 ug via INTRAVENOUS
  Administered 2015-10-09: 25 ug via INTRAVENOUS
  Administered 2015-10-09: 50 ug via INTRAVENOUS

## 2015-10-09 MED ORDER — GLIPIZIDE ER 2.5 MG PO TB24
2.5000 mg | ORAL_TABLET | Freq: Every day | ORAL | Status: DC
  Administered 2015-10-10 – 2015-10-12 (×3): 2.5 mg via ORAL
  Filled 2015-10-09 (×4): qty 1

## 2015-10-09 MED ORDER — ALPRAZOLAM 0.5 MG PO TABS: 0.5000 mg | ORAL_TABLET | Freq: Two times a day (BID) | ORAL | Status: DC | PRN

## 2015-10-09 MED ORDER — PHENOL 1.4 % MT LIQD: 1.0000 | OROMUCOSAL | Status: DC | PRN

## 2015-10-09 MED ORDER — MIDAZOLAM HCL 2 MG/2ML IJ SOLN
INTRAMUSCULAR | Status: AC
  Filled 2015-10-09: qty 2

## 2015-10-09 MED ORDER — BACITRACIN ZINC 500 UNIT/GM EX OINT
TOPICAL_OINTMENT | CUTANEOUS | Status: DC | PRN
  Administered 2015-10-09: 1 via TOPICAL

## 2015-10-09 MED ORDER — ONDANSETRON HCL 4 MG/2ML IJ SOLN
INTRAMUSCULAR | Status: AC
  Filled 2015-10-09: qty 4

## 2015-10-09 MED ORDER — WARFARIN SODIUM 5 MG PO TABS
5.0000 mg | ORAL_TABLET | Freq: Every day | ORAL | Status: DC
  Administered 2015-10-10 – 2015-10-11 (×2): 5 mg via ORAL
  Filled 2015-10-09 (×3): qty 1

## 2015-10-09 MED ORDER — PHENYLEPHRINE HCL 10 MG/ML IJ SOLN
10.0000 mg | INTRAVENOUS | Status: DC | PRN
  Administered 2015-10-09: 10 ug/min via INTRAVENOUS

## 2015-10-09 MED ORDER — CLOPIDOGREL BISULFATE 75 MG PO TABS
75.0000 mg | ORAL_TABLET | Freq: Every day | ORAL | Status: DC
  Administered 2015-10-10 – 2015-10-12 (×3): 75 mg via ORAL
  Filled 2015-10-09 (×3): qty 1

## 2015-10-09 MED ORDER — MORPHINE SULFATE (PF) 2 MG/ML IV SOLN
INTRAVENOUS | Status: AC
  Filled 2015-10-09: qty 1

## 2015-10-09 MED ORDER — ALUM & MAG HYDROXIDE-SIMETH 200-200-20 MG/5ML PO SUSP: 15.0000 mL | ORAL | Status: DC | PRN

## 2015-10-09 MED ORDER — ONDANSETRON HCL 4 MG/2ML IJ SOLN
INTRAMUSCULAR | Status: DC | PRN
  Administered 2015-10-09: 4 mg via INTRAVENOUS

## 2015-10-09 MED ORDER — MAGNESIUM SULFATE 2 GM/50ML IV SOLN: 2.0000 g | Freq: Every day | INTRAVENOUS | Status: DC | PRN

## 2015-10-09 MED ORDER — ACETAMINOPHEN 650 MG RE SUPP: 325.0000 mg | RECTAL | Status: DC | PRN

## 2015-10-09 MED ORDER — LIDOCAINE HCL (CARDIAC) 20 MG/ML IV SOLN
INTRAVENOUS | Status: DC | PRN
  Administered 2015-10-09: 60 mg via INTRAVENOUS

## 2015-10-09 MED ORDER — WARFARIN - PHYSICIAN DOSING INPATIENT
Freq: Every day | Status: DC
  Administered 2015-10-10: 17:00:00

## 2015-10-09 MED ORDER — EPHEDRINE SULFATE 50 MG/ML IJ SOLN
INTRAMUSCULAR | Status: AC
  Filled 2015-10-09: qty 2

## 2015-10-09 MED ORDER — CALCIUM CARBONATE-VITAMIN D 500-200 MG-UNIT PO TABS
1.0000 | ORAL_TABLET | Freq: Every day | ORAL | Status: DC
  Administered 2015-10-09 – 2015-10-12 (×4): 1 via ORAL
  Filled 2015-10-09 (×4): qty 1

## 2015-10-09 MED ORDER — LISINOPRIL-HYDROCHLOROTHIAZIDE 20-12.5 MG PO TABS: 1.0000 | ORAL_TABLET | Freq: Every day | ORAL | Status: DC

## 2015-10-09 MED ORDER — LOPERAMIDE HCL 2 MG PO CAPS: 2.0000 mg | ORAL_CAPSULE | Freq: Four times a day (QID) | ORAL | Status: DC | PRN

## 2015-10-09 MED ORDER — PHENYLEPHRINE 40 MCG/ML (10ML) SYRINGE FOR IV PUSH (FOR BLOOD PRESSURE SUPPORT)
PREFILLED_SYRINGE | INTRAVENOUS | Status: AC
  Filled 2015-10-09: qty 10

## 2015-10-09 MED ORDER — LABETALOL HCL 5 MG/ML IV SOLN: 10.0000 mg | INTRAVENOUS | Status: DC | PRN

## 2015-10-09 MED ORDER — MORPHINE SULFATE (PF) 2 MG/ML IV SOLN
2.0000 mg | INTRAVENOUS | Status: DC | PRN
  Administered 2015-10-09: 2 mg via INTRAVENOUS
  Filled 2015-10-09: qty 1

## 2015-10-09 MED ORDER — TEMAZEPAM 15 MG PO CAPS: 30.0000 mg | ORAL_CAPSULE | Freq: Every evening | ORAL | Status: DC | PRN

## 2015-10-09 MED ORDER — FENTANYL CITRATE (PF) 250 MCG/5ML IJ SOLN
INTRAMUSCULAR | Status: AC
  Filled 2015-10-09: qty 5

## 2015-10-09 MED ORDER — ACYCLOVIR 400 MG PO TABS
400.0000 mg | ORAL_TABLET | Freq: Two times a day (BID) | ORAL | Status: DC
  Administered 2015-10-09 – 2015-10-12 (×6): 400 mg via ORAL
  Filled 2015-10-09 (×9): qty 1

## 2015-10-09 MED ORDER — SODIUM CHLORIDE 0.9 % IJ SOLN
INTRAMUSCULAR | Status: AC
  Filled 2015-10-09: qty 20

## 2015-10-09 MED ORDER — ROCURONIUM BROMIDE 50 MG/5ML IV SOLN
INTRAVENOUS | Status: AC
  Filled 2015-10-09: qty 1

## 2015-10-09 MED ORDER — SODIUM CHLORIDE 0.9 % IV SOLN
INTRAVENOUS | Status: DC
  Administered 2015-10-09: 17:00:00 via INTRAVENOUS

## 2015-10-09 MED ORDER — PHENYLEPHRINE HCL 10 MG/ML IJ SOLN
INTRAMUSCULAR | Status: DC | PRN
  Administered 2015-10-09 (×3): 40 ug via INTRAVENOUS
  Administered 2015-10-09: 20 ug via INTRAVENOUS

## 2015-10-09 MED ORDER — LIDOCAINE HCL (CARDIAC) 20 MG/ML IV SOLN
INTRAVENOUS | Status: AC
Start: 2015-10-09 — End: 2015-10-09
  Filled 2015-10-09: qty 10

## 2015-10-09 MED ORDER — ONDANSETRON HCL 4 MG/2ML IJ SOLN
4.0000 mg | Freq: Four times a day (QID) | INTRAMUSCULAR | Status: DC | PRN
  Administered 2015-10-11: 4 mg via INTRAVENOUS
  Filled 2015-10-09: qty 2

## 2015-10-09 MED ORDER — PANTOPRAZOLE SODIUM 40 MG PO TBEC
40.0000 mg | DELAYED_RELEASE_TABLET | Freq: Every day | ORAL | Status: DC
  Administered 2015-10-09 – 2015-10-12 (×4): 40 mg via ORAL
  Filled 2015-10-09 (×4): qty 1

## 2015-10-09 MED ORDER — OXYCODONE HCL 5 MG PO TABS
5.0000 mg | ORAL_TABLET | ORAL | Status: DC | PRN
  Administered 2015-10-09 – 2015-10-11 (×5): 10 mg via ORAL
  Administered 2015-10-11 (×2): 5 mg via ORAL
  Administered 2015-10-11 – 2015-10-12 (×3): 10 mg via ORAL
  Filled 2015-10-09 (×8): qty 2
  Filled 2015-10-09: qty 1
  Filled 2015-10-09 (×2): qty 2

## 2015-10-09 MED ORDER — BACITRACIN ZINC 500 UNIT/GM EX OINT
TOPICAL_OINTMENT | CUTANEOUS | Status: AC
  Filled 2015-10-09: qty 28.35

## 2015-10-09 MED ORDER — PROCHLORPERAZINE MALEATE 10 MG PO TABS: 10.0000 mg | ORAL_TABLET | Freq: Four times a day (QID) | ORAL | Status: DC | PRN

## 2015-10-09 MED ORDER — MORPHINE SULFATE (PF) 2 MG/ML IV SOLN
1.0000 mg | INTRAVENOUS | Status: DC | PRN
  Administered 2015-10-09 – 2015-10-10 (×2): 1 mg via INTRAVENOUS
  Filled 2015-10-09 (×2): qty 1

## 2015-10-09 MED ORDER — BISACODYL 5 MG PO TBEC: 5.0000 mg | DELAYED_RELEASE_TABLET | Freq: Every day | ORAL | Status: DC | PRN

## 2015-10-09 MED ORDER — ACETAMINOPHEN 325 MG PO TABS
325.0000 mg | ORAL_TABLET | ORAL | Status: DC | PRN
  Administered 2015-10-11 – 2015-10-12 (×2): 650 mg via ORAL
  Filled 2015-10-09: qty 1
  Filled 2015-10-09: qty 2

## 2015-10-09 MED ORDER — SUCCINYLCHOLINE CHLORIDE 20 MG/ML IJ SOLN
INTRAMUSCULAR | Status: AC
  Filled 2015-10-09: qty 1

## 2015-10-09 MED ORDER — HYDROMORPHONE HCL 1 MG/ML IJ SOLN
INTRAMUSCULAR | Status: AC
  Filled 2015-10-09: qty 1

## 2015-10-09 MED ORDER — PROPOFOL 10 MG/ML IV BOLUS
INTRAVENOUS | Status: AC
  Filled 2015-10-09: qty 40

## 2015-10-09 MED ORDER — GLYCOPYRROLATE 0.2 MG/ML IJ SOLN
INTRAMUSCULAR | Status: AC
  Filled 2015-10-09: qty 1

## 2015-10-09 MED ORDER — PROPOFOL 10 MG/ML IV BOLUS
INTRAVENOUS | Status: DC | PRN
  Administered 2015-10-09: 40 mg via INTRAVENOUS
  Administered 2015-10-09 (×2): 20 mg via INTRAVENOUS

## 2015-10-09 MED ORDER — ENOXAPARIN SODIUM 80 MG/0.8ML ~~LOC~~ SOLN
1.5000 mg/kg | SUBCUTANEOUS | Status: DC
  Administered 2015-10-10 – 2015-10-12 (×3): 75 mg via SUBCUTANEOUS
  Filled 2015-10-09 (×3): qty 0.8

## 2015-10-09 MED ORDER — DEXTROSE 5 % IV SOLN
1.5000 g | Freq: Two times a day (BID) | INTRAVENOUS | Status: AC
  Administered 2015-10-09 – 2015-10-10 (×2): 1.5 g via INTRAVENOUS
  Filled 2015-10-09 (×2): qty 1.5

## 2015-10-09 MED ORDER — DOCUSATE SODIUM 100 MG PO CAPS
100.0000 mg | ORAL_CAPSULE | Freq: Every day | ORAL | Status: DC
  Administered 2015-10-10 – 2015-10-12 (×3): 100 mg via ORAL
  Filled 2015-10-09 (×3): qty 1

## 2015-10-09 MED ORDER — EPHEDRINE SULFATE 50 MG/ML IJ SOLN
INTRAMUSCULAR | Status: DC | PRN
  Administered 2015-10-09: 5 mg via INTRAVENOUS
  Administered 2015-10-09: 2.5 mg via INTRAVENOUS

## 2015-10-09 MED ORDER — 0.9 % SODIUM CHLORIDE (POUR BTL) OPTIME
TOPICAL | Status: DC | PRN
  Administered 2015-10-09: 1000 mL

## 2015-10-09 SURGICAL SUPPLY — 47 items
BANDAGE ELASTIC 4 VELCRO ST LF (GAUZE/BANDAGES/DRESSINGS) ×3 IMPLANT
BANDAGE ESMARK 6X9 LF (GAUZE/BANDAGES/DRESSINGS) ×1 IMPLANT
BLADE SAW RECIP 87.9 MT (BLADE) ×3 IMPLANT
BNDG COHESIVE 6X5 TAN STRL LF (GAUZE/BANDAGES/DRESSINGS) ×3 IMPLANT
BNDG ESMARK 6X9 LF (GAUZE/BANDAGES/DRESSINGS) ×3
BNDG GAUZE ELAST 4 BULKY (GAUZE/BANDAGES/DRESSINGS) ×3 IMPLANT
CANISTER SUCTION 2500CC (MISCELLANEOUS) ×3 IMPLANT
CLIP TI MEDIUM 6 (CLIP) IMPLANT
COVER SURGICAL LIGHT HANDLE (MISCELLANEOUS) ×3 IMPLANT
CUFF TOURNIQUET SINGLE 24IN (TOURNIQUET CUFF) ×3 IMPLANT
DRAIN CHANNEL 19F RND (DRAIN) IMPLANT
DRAPE ORTHO SPLIT 77X108 STRL (DRAPES) ×4
DRAPE PROXIMA HALF (DRAPES) ×3 IMPLANT
DRAPE SURG ORHT 6 SPLT 77X108 (DRAPES) ×2 IMPLANT
DRAPE U-SHAPE 47X51 STRL (DRAPES) ×3 IMPLANT
DRSG ADAPTIC 3X8 NADH LF (GAUZE/BANDAGES/DRESSINGS) ×3 IMPLANT
ELECT REM PT RETURN 9FT ADLT (ELECTROSURGICAL) ×3
ELECTRODE REM PT RTRN 9FT ADLT (ELECTROSURGICAL) ×1 IMPLANT
EVACUATOR SILICONE 100CC (DRAIN) IMPLANT
GAUZE SPONGE 4X4 12PLY STRL (GAUZE/BANDAGES/DRESSINGS) ×6 IMPLANT
GLOVE BIO SURGEON STRL SZ7.5 (GLOVE) ×3 IMPLANT
GLOVE BIOGEL PI IND STRL 6.5 (GLOVE) ×1 IMPLANT
GLOVE BIOGEL PI IND STRL 7.0 (GLOVE) ×1 IMPLANT
GLOVE BIOGEL PI IND STRL 8 (GLOVE) ×1 IMPLANT
GLOVE BIOGEL PI INDICATOR 6.5 (GLOVE) ×2
GLOVE BIOGEL PI INDICATOR 7.0 (GLOVE) ×2
GLOVE BIOGEL PI INDICATOR 8 (GLOVE) ×2
GLOVE SURG SS PI 6.5 STRL IVOR (GLOVE) ×3 IMPLANT
GOWN STRL REUS W/ TWL LRG LVL3 (GOWN DISPOSABLE) ×3 IMPLANT
GOWN STRL REUS W/TWL LRG LVL3 (GOWN DISPOSABLE) ×6
KIT BASIN OR (CUSTOM PROCEDURE TRAY) ×3 IMPLANT
KIT ROOM TURNOVER OR (KITS) ×3 IMPLANT
NS IRRIG 1000ML POUR BTL (IV SOLUTION) ×3 IMPLANT
PACK GENERAL/GYN (CUSTOM PROCEDURE TRAY) ×3 IMPLANT
PAD ARMBOARD 7.5X6 YLW CONV (MISCELLANEOUS) ×6 IMPLANT
SPONGE GAUZE 4X4 12PLY STER LF (GAUZE/BANDAGES/DRESSINGS) ×3 IMPLANT
STAPLER VISISTAT (STAPLE) ×3 IMPLANT
STOCKINETTE IMPERVIOUS LG (DRAPES) ×3 IMPLANT
SUT ETHILON 3 0 PS 1 (SUTURE) IMPLANT
SUT SILK 0 TIES 10X30 (SUTURE) ×3 IMPLANT
SUT SILK 2 0 (SUTURE) ×2
SUT SILK 2 0 SH CR/8 (SUTURE) ×3 IMPLANT
SUT SILK 2-0 18XBRD TIE 12 (SUTURE) ×1 IMPLANT
SUT SILK 3 0 (SUTURE) ×2
SUT SILK 3-0 18XBRD TIE 12 (SUTURE) ×1 IMPLANT
SUT VIC AB 2-0 CT1 18 (SUTURE) ×3 IMPLANT
UNDERPAD 30X30 INCONTINENT (UNDERPADS AND DIAPERS) ×3 IMPLANT

## 2015-10-09 NOTE — Op Note (Signed)
    NAME: [name]    MRN: [id number] DOB: [birth year]    DATE OF OPERATION: 10/09/2015  PREOP DIAGNOSIS: gangrene of the left foot  POSTOP DIAGNOSIS: same  PROCEDURE: Left below the knee amputation  SURGEON: Judeth Cornfield. Scot Dock, MD, FACS  ASSIST: Gerri Lins PA  ANESTHESIA: Gen.   EBL: minimal  INDICATIONS: [name] is a 80 y.o. female who presented with gangrene of the left foot. She had severe tibial artery occlusive disease with no options for revascularization. I recommended left below the knee amputation.  FINDINGS: The muscle was well perfused.  TECHNIQUE: The patient was taken to the operating room and received a general anesthetic. The left lower extremity was prepped and draped in the usual sterile fashion. The circumference of the limb was measured 10 Center liters distal to the tibial tuberosity. Two thirds of this distance was used to mark the anterior skin flap. A long posterior flap of equal length was marked. The tourniquet had been placed on the upper thigh. The leg was exsanguinated with an Esmarch bandage. The tourniquet was inflated to 300 mmHg. Under tourniquet control, the incision was carried down to the skin, subcutaneous tissue, fascia to the muscle which was divided down to the level of the tibia and fibula. The bone was divided proximal to the level skin division. The anterior aspect of the tibia was beveled. The limb was removed. The tibial vessels were all individually suture ligated with 2-0 silk ties. The tourniquet was then released. Additional hemostasis was obtained using electrocautery and 2-0 silk ties. The edges of the bone were rasped. The wound was irrigated, smokes of saline. The fascial layer was closed with interrupted 2-0 Vicryl's. The skin was closed with staples. Sterile dressing was applied. The patient tolerated the procedure well and was transferred to the recovery room in stable condition. All needle and sponge counts were  correct.  [name], MD, FACS [facility]  DATE OF DICTATION:   10/09/2015

## 2015-10-09 NOTE — Progress Notes (Signed)
Utilization review completed.  

## 2015-10-09 NOTE — Interval H&P Note (Signed)
History and Physical Interval Note:  10/09/2015 7:28 AM  [name]  has presented today for surgery, with the diagnosis of  peripheral vascular disease with Nonviable tissue  The various methods of treatment have been discussed with the patient and family. After consideration of risks, benefits and other options for treatment, the patient has consented to  Procedure(s): AMPUTATION BELOW KNEE (Left) as a surgical intervention .  [name] history has been reviewed, patient examined, no change in status, stable for surgery.  I have reviewed the patient's chart and labs.  Questions were answered to the patient's satisfaction.     [name]

## 2015-10-09 NOTE — Transfer of Care (Signed)
Immediate Anesthesia Transfer of Care Note  Patient: [name]  Procedure(s) Performed: Procedure(s): Left Leg AMPUTATION BELOW KNEE (Left)  Patient Location: PACU  Anesthesia Type:General  Level of Consciousness: awake, alert  and patient cooperative  Airway & Oxygen Therapy: Patient Spontanous Breathing and Patient connected to face mask oxygen  Post-op Assessment: Report given to RN and Post -op Vital signs reviewed and stable  Post vital signs: Reviewed and stable  Last Vitals:  Filed Vitals:   10/09/15 0620 10/09/15 0701  BP: 177/97 141/63  Pulse: 64 75  Temp: 36.2 C   Resp: 20 16    Complications: No apparent anesthesia complications

## 2015-10-09 NOTE — Telephone Encounter (Signed)
Xanax refilled, phoned in.

## 2015-10-09 NOTE — Anesthesia Procedure Notes (Signed)
Procedure Name: LMA Insertion Date/Time: 10/09/2015 7:52 AM Performed by: Merdis Delay Pre-anesthesia Checklist: Patient identified, Timeout performed, Emergency Drugs available, Suction available and Patient being monitored Patient Re-evaluated:Patient Re-evaluated prior to inductionOxygen Delivery Method: Circle system utilized Intubation Type: IV induction LMA: LMA inserted LMA Size: 4.0 Number of attempts: 1 Placement Confirmation: positive ETCO2,  CO2 detector and breath sounds checked- equal and bilateral Tube secured with: Tape Dental Injury: Teeth and Oropharynx as per pre-operative assessment

## 2015-10-09 NOTE — H&P (View-Only) (Signed)
Patient name: [name] MRN: [id number] DOB: 03/08/1936 Sex: female  REASON FOR VISIT: follow up of peripheral vascular disease and nonhealing wound of the left foot.  HPI: [name] is a 80 y.o. female who I saw in consultation at [facility] with a nonhealing wound of the left foot. She had dry gangrene of the left second toe and was seen by the vascular surgeons in [facility].The patient underwent an arteriogram on 08/14/2015. I am unable to view these images but I do have the report which describes no significant disease down through the popliteal artery on the left. However, she had severe tibial artery occlusive disease. The peroneal artery Was severely diseased and occluded distally. The anterior tibial artery was occluded with no reconstitution distally. The posterior tibial artery had several stenoses which were successfully ballooned. According to the family were no further options for revascularization. When I saw her there she had brisk flow in her posterior tibial artery on the left side suspect the intervention was 8 and. I explained that it was nothing further to offer from a vascular standpoint and that she should follow up with the wound care center. I recommended keeping dry dressings on the toes in the left foot and soaking the foot daily and Luke warm dilute soap soaks. She comes in for a follow up visit.  Since I saw her as a consult in the hospital. The wounds on her left foot have progressed. She now has extensive dry gangrene involving the second and third toes. He denies fever or chills. She does have pain in the foot related to the wound.  Current Outpatient Prescriptions  Medication Sig Dispense Refill  . acyclovir (ZOVIRAX) 400 MG tablet TAKE ONE TABLET BY MOUTH TWICE DAILY 60 tablet 0  . ALPRAZolam (XANAX) 0.5 MG tablet TAKE ONE TABLET BY MOUTH TWICE DAILY AS NEEDED FOR ANXIETY (Patient taking differently: TAKE 0.'25mg'$  to 0.'5mg'$  BY MOUTH  TWICE DAILY AS NEEDED FOR ANXIETY) 60 tablet 0  . amLODipine (NORVASC) 5 MG tablet Take 1 tablet (5 mg total) by mouth daily.    . Calcium Carbonate-Vitamin D 600-400 MG-UNIT per tablet Take 1 tablet by mouth.     . clopidogrel (PLAVIX) 75 MG tablet Take 1 tablet (75 mg total) by mouth daily. 30 tablet 11  . dexamethasone (DECADRON) 4 MG tablet TAKE 10 TABS BY MOUTH WEEKLY ON DAY 1 WITH CHEMOTHERAPY TREATMENT CARFILZOMIB 40 tablet 0  . glipiZIDE (GLUCOTROL XL) 2.5 MG 24 hr tablet TAKE ONE TABLET BY MOUTH ONCE DAILY 90 tablet 1  . lisinopril-hydrochlorothiazide (PRINZIDE,ZESTORETIC) 20-12.5 MG per tablet Take 1 tablet by mouth daily. 90 tablet 3  . loperamide (IMODIUM A-D) 2 MG tablet Take 1 tablet (2 mg total) by mouth 4 (four) times daily as needed for diarrhea or loose stools.    Marland Kitchen oxyCODONE (OXY IR/ROXICODONE) 5 MG immediate release tablet Take 2-2.5 tablets (10-12.5 mg total) by mouth every 6 (six) hours as needed for severe pain. 50 tablet 0  . potassium chloride SA (K-DUR,KLOR-CON) 20 MEQ tablet Take 1 tablet (20 mEq total) by mouth daily. 30 tablet 0  . prochlorperazine (COMPAZINE) 10 MG tablet Take 1 tablet (10 mg total) by mouth every 6 (six) hours as needed. 30 tablet 0  . temazepam (RESTORIL) 30 MG capsule Take 1 capsule (30 mg total) by mouth at bedtime as needed. 30 capsule 0  . warfarin (COUMADIN) 5 MG tablet Take 1 tablet (5 mg total) by mouth  daily at 6 PM.  0  . doxycycline (VIBRA-TABS) 100 MG tablet Take 1 tablet (100 mg total) by mouth every 12 (twelve) hours. For 10days (Patient not taking: Reported on 09/26/2015) 20 tablet 0   No current facility-administered medications for this visit.   Facility-Administered Medications Ordered in Other Visits  Medication Dose Route Frequency Provider Last Rate Last Dose  . 0.9 %  sodium chloride infusion   Intravenous Once Curt Bears, MD   Stopped at 03/21/15 1720  . 0.9 %  sodium chloride infusion   Intravenous Once Curt Bears,  MD      . 0.9 %  sodium chloride infusion   Intravenous Once Curt Bears, MD   Stopped at 07/31/15 1638    REVIEW OF SYSTEMS:  '[X]'$  denotes positive finding, '[ ]'$  denotes negative finding Cardiac  Comments:  Chest pain or chest pressure:    Shortness of breath upon exertion:    Short of breath when lying flat:    Irregular heart rhythm:    Constitutional    Fever or chills:    She does have rest pain of the left foot. She also notes some swelling on the left foot. PHYSICAL EXAM: Filed Vitals:   09/26/15 0900  BP: 138/70  Pulse: 78  Temp: 98.7 F (37.1 C)  TempSrc: Oral  Height: 5' (1.524 m)  Weight: 112 lb (50.803 kg)  SpO2: 99%    GENERAL: The patient is a well-nourished female, in no acute distress. The vital signs are documented above. CARDIOVASCULAR: There is a regular rate and rhythm. PULMONARY: There is good air exchange bilaterally without wheezing or rales. On the left side, she has a palpable femoral and popliteal pulse. She has a brisk posterior tibial signal with the Doppler and a brisk anterior tibial signal, but a dampened monophasic dorsalis pedis signal. His extensive dry gangrene of the left second and third toes.  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 Wounds on the left foot are not healing. I have recommended that we either proceed with a primary below the knee amputation or we could attempt transmetatarsal amputation with a 50% chance of healing. Main concern is her distal disease in her foot. The patient will discuss this with her family and decide which option she prefers. In the meantime I have recommended she soak her foot daily and Luke warm dilute soap soaks. I have written her a prescription for Keflex. She will also keep dry gauze between the toes.  [name] [facility] [place]: [phone]

## 2015-10-09 NOTE — Anesthesia Postprocedure Evaluation (Signed)
Anesthesia Post Note  Patient: [name]  Procedure(s) Performed: Procedure(s) (LRB): Left Leg AMPUTATION BELOW KNEE (Left)  Patient location during evaluation: PACU Anesthesia Type: General Level of consciousness: awake and alert Pain management: pain level controlled Vital Signs Assessment: post-procedure vital signs reviewed and stable Respiratory status: spontaneous breathing, nonlabored ventilation, respiratory function stable and patient connected to nasal cannula oxygen Cardiovascular status: blood pressure returned to baseline and stable Postop Assessment: no signs of nausea or vomiting Anesthetic complications: no    Last Vitals:  Filed Vitals:   10/09/15 0915 10/09/15 0930  BP: 127/61 99/59  Pulse: 73 76  Temp:    Resp: 26 14    Last Pain:  Filed Vitals:   10/09/15 0954  PainSc: 0-No pain                 [name]

## 2015-10-10 ENCOUNTER — Encounter (HOSPITAL_COMMUNITY): Payer: Self-pay | Admitting: Vascular Surgery

## 2015-10-10 DIAGNOSIS — Z89512 Acquired absence of left leg below knee: Secondary | ICD-10-CM

## 2015-10-10 DIAGNOSIS — I739 Peripheral vascular disease, unspecified: Secondary | ICD-10-CM

## 2015-10-10 DIAGNOSIS — C9002 Multiple myeloma in relapse: Secondary | ICD-10-CM

## 2015-10-10 LAB — CBC
HCT: 28.3 % — ABNORMAL LOW (ref 36.0–46.0)
Hemoglobin: 8.7 g/dL — ABNORMAL LOW (ref 12.0–15.0)
MCH: 29.1 pg (ref 26.0–34.0)
MCHC: 30.7 g/dL (ref 30.0–36.0)
MCV: 94.6 fL (ref 78.0–100.0)
PLATELETS: 235 10*3/uL (ref 150–400)
RBC: 2.99 MIL/uL — ABNORMAL LOW (ref 3.87–5.11)
RDW: 15 % (ref 11.5–15.5)
WBC: 7.8 10*3/uL (ref 4.0–10.5)

## 2015-10-10 LAB — GLUCOSE, CAPILLARY
GLUCOSE-CAPILLARY: 125 mg/dL — AB (ref 65–99)
GLUCOSE-CAPILLARY: 69 mg/dL (ref 65–99)
GLUCOSE-CAPILLARY: 197 mg/dL — AB (ref 65–99)
Glucose-Capillary: 157 mg/dL — ABNORMAL HIGH (ref 65–99)

## 2015-10-10 LAB — BASIC METABOLIC PANEL
Anion gap: 9 (ref 5–15)
BUN: 9 mg/dL (ref 6–20)
CALCIUM: 8.3 mg/dL — AB (ref 8.9–10.3)
CO2: 25 mmol/L (ref 22–32)
CREATININE: 0.83 mg/dL (ref 0.44–1.00)
Chloride: 102 mmol/L (ref 101–111)
GLUCOSE: 209 mg/dL — AB (ref 65–99)
Potassium: 4.1 mmol/L (ref 3.5–5.1)
Sodium: 136 mmol/L (ref 135–145)

## 2015-10-10 LAB — PROTIME-INR
INR: 1.19 (ref 0.00–1.49)
PROTHROMBIN TIME: 15.3 s — AB (ref 11.6–15.2)

## 2015-10-10 MED ORDER — BOOST / RESOURCE BREEZE PO LIQD
1.0000 | Freq: Three times a day (TID) | ORAL | Status: DC
  Administered 2015-10-10 – 2015-10-12 (×5): 1 via ORAL

## 2015-10-10 MED ORDER — INSULIN ASPART 100 UNIT/ML ~~LOC~~ SOLN
0.0000 [IU] | Freq: Three times a day (TID) | SUBCUTANEOUS | Status: DC
  Administered 2015-10-10: 1 [IU] via SUBCUTANEOUS
  Administered 2015-10-10: 2 [IU] via SUBCUTANEOUS
  Administered 2015-10-11: 3 [IU] via SUBCUTANEOUS
  Administered 2015-10-11 (×2): 1 [IU] via SUBCUTANEOUS
  Administered 2015-10-12: 2 [IU] via SUBCUTANEOUS

## 2015-10-10 NOTE — Consult Note (Signed)
Physical Medicine and Rehabilitation Consult Reason for Consult: Left BKA Referring Physician: Dr. Scot Dock   HPI: [name] is a 80 y.o. right handed female with history of multiple myeloma followed by Dr. Julien Nordmann, cardiomyopathy, diabetes mellitus with peripheral neuropathy, hypertension. Patient lives with son. Used a walker prior to admission. One level home with rehabilitation. Question 24/ 7 assist on discharge. Presented 10/09/2015 with a gangrenous left foot and no options for revascularization. Limb was not felt to be salvageable. Underwent left BKA 10/09/2015 per Dr. [name]. Hospital course pain management. Acute blood loss anemia at 10.3 and monitored. Physical and occupational therapy evaluations pending. M.D. has requested physical medicine rehabilitation consult.  Patient was living at home with her son who is in a wheelchair. She has 2 daughters one who is at bedside and the other who was one of the caregivers. The other daughter has had a stroke and is no longer able to be a caregiver. Patient has ambulated with a walker over the last couple years. She states that she has pain all over the place due to her multiple myeloma. At home she was on oxycodone 10 mg 4 times a day According to oncology notes from January 2017, her multiple myeloma has relapsed.  Review of Systems  Constitutional: Negative for fever and chills.  HENT: Negative for hearing loss.   Eyes: Negative for blurred vision and double vision.  Respiratory: Negative for cough and shortness of breath.   Cardiovascular: Positive for leg swelling. Negative for chest pain and palpitations.  Gastrointestinal: Positive for constipation. Negative for nausea and vomiting.  Genitourinary: Positive for urgency. Negative for dysuria and hematuria.  Musculoskeletal: Positive for myalgias and joint pain.  Skin: Negative for rash.  Neurological: Negative for weakness and headaches.    Psychiatric/Behavioral: Positive for depression.       Anxiety  All other systems reviewed and are negative.  Past Medical History  Diagnosis Date  . Blood transfusion   . Depression     Mild  . History of chicken pox   . Hyperlipidemia   . Hypertension   . Degenerative joint disease   . Phlebitis   . Multiple myeloma     per Dr. Julien Nordmann, s/p palliative radiation for leg pain 2011 per Dr. Sondra Come  . Deep vein thrombosis of bilateral lower extremities (HCC)   . Cardiomyopathy ([place])   . Diabetes mellitus     Steroid related  . UTI (urinary tract infection) 09/08/2013  . History of radiation therapy 08/30/13-09/20/13    20 gray to lower lumbar/upper sacrum  . Heart murmur   . Anxiety   . Anemia   . Family history of anesthesia complication     DAUGHTER CPR AFTER  . History of shingles    Past Surgical History  Procedure Laterality Date  . Abdominal hysterectomy    . Ankle fracture surgery Left   . Ear cyst excision Left 04/16/2015    Procedure: EXCISION FACIAL CYST LEFT SIDE ;  Surgeon: Izora Gala, MD;  Location: [facility];  Service: ENT;  Laterality: Left;  . Peripheral vascular catheterization  08/14/2015    Procedure: Lower Extremity Intervention;  Surgeon: Katha Cabal, MD;  Location: [facility];  Service: Cardiovascular;;  . Peripheral vascular catheterization N/A 08/14/2015    Procedure: Abdominal Aortogram w/Lower Extremity;  Surgeon: Katha Cabal, MD;  Location: [facility];  Service: Cardiovascular;  Laterality: N/A;  . Colonoscopy    .  Eye surgery Bilateral     cataract surgery   Family History  Problem Relation Age of Onset  . Diabetes Mother   . Hypertension Mother   . Arthritis Mother   . Stroke Mother   . Diabetes Father   . Hypertension Father   . Arthritis Father   . Stroke Father   . Cancer Sister     stomach  . Stroke Brother   . Cancer Brother     prostate <50  . Stroke Daughter   . Stroke Son    . Arthritis Other     Parents  . Cancer Other     Colon, 1st degree relative <60  . Diabetes Other     Parents  . Stroke Other     1st degree relative <60   Social History:  reports that she quit smoking about 42 years ago. She has never used smokeless tobacco. She reports that she does not drink alcohol or use illicit drugs. Allergies: No Known Allergies Medications Prior to Admission  Medication Sig Dispense Refill  . acyclovir (ZOVIRAX) 400 MG tablet TAKE ONE TABLET BY MOUTH TWICE DAILY 60 tablet 0  . amLODipine (NORVASC) 5 MG tablet Take 1 tablet (5 mg total) by mouth daily.    . Calcium Carbonate-Vitamin D 600-400 MG-UNIT per tablet Take 1 tablet by mouth daily.     . cephALEXin (KEFLEX) 500 MG capsule Take 1 capsule (500 mg total) by mouth 3 (three) times daily. 42 capsule 2  . clopidogrel (PLAVIX) 75 MG tablet Take 1 tablet (75 mg total) by mouth daily. 30 tablet 11  . dexamethasone (DECADRON) 4 MG tablet TAKE 10 TABS BY MOUTH WEEKLY ON DAY 1 WITH CHEMOTHERAPY TREATMENT CARFILZOMIB 40 tablet 0  . enoxaparin (LOVENOX) 80 MG/0.8ML injection Inject 0.75 mLs (75 mg total) into the skin daily. 5 Syringe 0  . glipiZIDE (GLUCOTROL XL) 2.5 MG 24 hr tablet TAKE ONE TABLET BY MOUTH ONCE DAILY 90 tablet 1  . lisinopril-hydrochlorothiazide (PRINZIDE,ZESTORETIC) 20-12.5 MG per tablet Take 1 tablet by mouth daily. 90 tablet 3  . loperamide (IMODIUM A-D) 2 MG tablet Take 1 tablet (2 mg total) by mouth 4 (four) times daily as needed for diarrhea or loose stools.    Marland Kitchen oxyCODONE (OXY IR/ROXICODONE) 5 MG immediate release tablet Take  1-2 tablets ('10mg'$  total) by mouth every 6 hours as needed for severe pain 90 tablet 0  . potassium chloride SA (K-DUR,KLOR-CON) 20 MEQ tablet Take 1 tablet (20 mEq total) by mouth daily. 30 tablet 0  . prochlorperazine (COMPAZINE) 10 MG tablet Take 1 tablet (10 mg total) by mouth every 6 (six) hours as needed. 30 tablet 0  . warfarin (COUMADIN) 5 MG tablet Take 1 tablet  (5 mg total) by mouth daily at 6 PM. (Patient taking differently: Take 2.5-5 mg by mouth daily at 6 PM. 2.5 mg on Monday, 5 mg all other days)  0  . temazepam (RESTORIL) 30 MG capsule Take 1 capsule (30 mg total) by mouth at bedtime as needed. 30 capsule 0    Home:    Functional History:   Functional Status:  Mobility:          ADL:    Cognition: Cognition Orientation Level: Oriented to person, Oriented to place, Disoriented to time, Disoriented to situation    Blood pressure 121/55, pulse 96, temperature 100.3 F (37.9 C), temperature source Oral, resp. rate 18, height 5' (1.524 m), weight 49.6 kg (109 lb 5.6 oz), SpO2 100 %.  Physical Exam  HENT:  Head: Normocephalic.  Eyes: EOM are normal.  Neck: Normal range of motion. Neck supple. No thyromegaly present.  Cardiovascular: Normal rate and regular rhythm.   Respiratory: Effort normal and breath sounds normal. No respiratory distress.  GI: Soft. Bowel sounds are normal. She exhibits no distension.  Neurological:  Patient is a bit lethargic but arousable. Follows basic commands and answer simple questions  Skin:  BKA site is dressed appropriately tender  Motor strength is 4/5 bilateral deltoid, biceps, triceps, grip, right hip flexor and knee extensor and ankle dorsiflexor Left lower extremity 3 minus hip flexor with hip abduction and adduction 3 minus  Results for orders placed or performed during the hospital encounter of 10/09/15 (from the past 24 hour(s))  Glucose, capillary     Status: Abnormal   Collection Time: 10/09/15  6:52 AM  Result Value Ref Range   Glucose-Capillary 129 (H) 65 - 99 mg/dL  APTT     Status: None   Collection Time: 10/09/15  6:57 AM  Result Value Ref Range   aPTT 34 24 - 37 seconds  Basic metabolic panel     Status: Abnormal   Collection Time: 10/09/15  6:57 AM  Result Value Ref Range   Sodium 137 135 - 145 mmol/L   Potassium 4.2 3.5 - 5.1 mmol/L   Chloride 101 101 - 111 mmol/L   CO2 23  22 - 32 mmol/L   Glucose, Bld 143 (H) 65 - 99 mg/dL   BUN 27 (H) 6 - 20 mg/dL   Creatinine, Ser 2.28 (H) 0.44 - 1.00 mg/dL   Calcium 9.2 8.9 - 02.1 mg/dL   GFR calc non Af Amer 50 (L) >60 mL/min   GFR calc Af Amer 59 (L) >60 mL/min   Anion gap 13 5 - 15  CBC     Status: Abnormal   Collection Time: 10/09/15  6:57 AM  Result Value Ref Range   WBC 6.4 4.0 - 10.5 K/uL   RBC 3.46 (L) 3.87 - 5.11 MIL/uL   Hemoglobin 10.3 (L) 12.0 - 15.0 g/dL   HCT 89.7 (L) 33.6 - 18.7 %   MCV 96.5 78.0 - 100.0 fL   MCH 29.8 26.0 - 34.0 pg   MCHC 30.8 30.0 - 36.0 g/dL   RDW 48.9 73.5 - 72.4 %   Platelets 270 150 - 400 K/uL  Protime-INR     Status: Abnormal   Collection Time: 10/09/15  6:57 AM  Result Value Ref Range   Prothrombin Time 15.3 (H) 11.6 - 15.2 seconds   INR 1.19 0.00 - 1.49  Glucose, capillary     Status: Abnormal   Collection Time: 10/09/15  9:03 AM  Result Value Ref Range   Glucose-Capillary 144 (H) 65 - 99 mg/dL   Comment 1 Notify RN    Comment 2 Document in Chart    *Note: Due to a large number of results and/or encounters for the requested time period, some results have not been displayed. A complete set of results can be found in Results Review.   No results found.  Assessment/Plan: Diagnosis:Left below-knee amputation secondary to peripheral vascular disease, Postoperative day #1 1. Does the need for close, 24 hr/day medical supervision in concert with the patient's rehab needs make it unreasonable for this patient to be served in a less intensive setting? Potentially 2. Co-Morbidities requiring supervision/potential complications: Multiple myeloma with recent relapse, diabetes with peripheral neuropathy 3. Due to bladder management, bowel management, safety, skin/wound care,  disease management, medication administration, pain management and patient education, does the patient require 24 hr/day rehab nursing? Yes 4. Does the patient require coordinated care of a physician, rehab  nurse, PT, OT to address physical and functional deficits in the context of the above medical diagnosis(es)? Yes Addressing deficits in the following areas: balance, endurance, locomotion, strength, transferring, bowel/bladder control, bathing, dressing, feeding, grooming and toileting 5. Can the patient actively participate in an intensive therapy program of at least 3 hrs of therapy per day at least 5 days per week? No 6. The potential for patient to make measurable gains while on inpatient rehab is not applicable 7. Anticipated functional outcomes upon discharge from inpatient rehab are n/a  with PT, n/a with OT, n/a with SLP. 8. Estimated rehab length of stay to reach the above functional goals is:Not applicable 9. Does the patient have adequate social supports and living environment to accommodate these discharge functional goals? Potentially 10. Anticipated D/C setting: unclear given her  usual caregiver is no longer able to help her. She does have a son in a wheelchair but is not able to provide any physical assistance. 11. Anticipated post D/C treatments: SNF versus CIR depending on her ability to tolerate inpatient rehabilitation at a more intensive level 12. Overall Rehab/Functional Prognosis: fair  RECOMMENDATIONS: This patient's condition is appropriate for continued rehabilitative care in the following setting: Will need to assess PT OT participation and assess caregiver abilities Patient has agreed to participate in recommended program. N/A Note that insurance prior authorization may be required for reimbursement for recommended care.  Comment: Concerns about ability to tolerate inpatient rib rotation especially in light of recent relapse of multiple myeloma. Also questions as to when chemotherapy will be restarted.    10/10/2015

## 2015-10-10 NOTE — Evaluation (Signed)
Occupational Therapy Evaluation Patient Details Name: [name] MRN: [id number] DOB: 03-25-1936 Today's Date: 10/10/2015    History of Present Illness S/P L BKA due to PVD and non healing wound. PMH:  HTN, multiple myeloma, DM.   Clinical Impression   Pt was performing self care, laundry and light housekeeping at a modified independent level prior to admission. She was using a RW for ambulation.  Her family performed meal prep and assisted with medication. Pt presents with impaired cognition, per daughter she is near her baseline. She is anxious about falling. Pt with decreased sitting and standing balance. She requires 2 person assist to pivot to the chair. Recommending inpatient rehab prior to return home with her family. Will follow acutely.    Follow Up Recommendations  CIR    Equipment Recommendations       Recommendations for Other Services Rehab consult     Precautions / Restrictions Precautions Precautions: Fall Restrictions Weight Bearing Restrictions: No      Mobility Bed Mobility Overal bed mobility: Needs Assistance Bed Mobility: Supine to Sit     Supine to sit: Mod assist     General bed mobility comments: verbal cues for technique, assist to raise trunk  Transfers Overall transfer level: Needs assistance Equipment used: Rolling walker (2 wheeled) Transfers: Sit to/from Stand;Stand Pivot Transfers Sit to Stand: +2 physical assistance;Min assist Stand pivot transfers: +2 physical assistance;Mod assist       General transfer comment: cues for hand placement, to rise, for stability and to move walker as she pivoted on her R foot    Balance Overall balance assessment: Needs assistance   Sitting balance-Leahy Scale: Fair Sitting balance - Comments: pt initially heavily reliant on UEs, with wide range reaching trials, pt eventually able to sit unsupported Postural control: Posterior lean   Standing balance-Leahy Scale: Poor Standing balance  comment: heavily reliant on UEs, anxious                            ADL Overall ADL's : Needs assistance/impaired Eating/Feeding: Independent;Bed level   Grooming: Supervision/safety;Sitting   Upper Body Bathing: Sitting;Min guard   Lower Body Bathing: Maximal assistance;Sit to/from stand   Upper Body Dressing : Sitting;Min guard   Lower Body Dressing: Maximal assistance;Sit to/from stand   Toilet Transfer: +2 for physical assistance;Moderate assistance;RW;Stand-pivot   Toileting- Clothing Manipulation and Hygiene: Maximal assistance;Sit to/from stand               Vision Additional Comments: pt with hx of cataract sx, reports vision is good   Perception     Praxis      Pertinent Vitals/Pain Pain Assessment: Faces Faces Pain Scale: Hurts little more Pain Location: L LE Pain Descriptors / Indicators: Operative site guarding;Guarding Pain Intervention(s): Limited activity within patient's tolerance;Repositioned;Monitored during session     Hand Dominance Right   Extremity/Trunk Assessment Upper Extremity Assessment Upper Extremity Assessment: Overall WFL for tasks assessed   Lower Extremity Assessment Lower Extremity Assessment: Defer to PT evaluation       Communication Communication Communication: HOH   Cognition Arousal/Alertness: Awake/alert Behavior During Therapy: Anxious Overall Cognitive Status: History of cognitive impairments - at baseline (per daughter, Maudie Mercury, pt is close to her baseline)       Memory: Decreased short-term memory             General Comments       Exercises       Shoulder Instructions  Home Living Family/patient expects to be discharged to:: Private residence Living Arrangements: Children (son) Available Help at Discharge: Family;Available 24 hours/day Type of Home: House Home Access: Ramped entrance     Home Layout: One level     Bathroom Shower/Tub: Teacher, early years/pre:  Standard     Home Equipment: Environmental consultant - 2 wheels;Bedside commode   Additional Comments: son is w/c bound, daughter [name] is looking into 24 hour care at home      Prior Functioning/Environment Level of Independence: Needs assistance  Gait / Transfers Assistance Needed: Walking household distances with RW. ADL's / Homemaking Assistance Needed: Sponge bathed, dressed, self fed, toileted modified independently. Performed light housekeeping and laundry, Son did houskeeping   Comments: family helped with medications    OT Diagnosis: Generalized weakness;Cognitive deficits;Acute pain   OT Problem List: Decreased strength;Decreased activity tolerance   OT Treatment/Interventions: Self-care/ADL training;DME and/or AE instruction;Patient/family education;Balance training    OT Goals(Current goals can be found in the care plan section) Acute Rehab OT Goals Patient Stated Goal: Return to independence OT Goal Formulation: With patient Time For Goal Achievement: 10/24/15 Potential to Achieve Goals: Good ADL Goals Pt Will Perform Grooming: with supervision;standing Pt Will Perform Lower Body Bathing: with supervision;sit to/from stand Pt Will Perform Lower Body Dressing: with supervision;sit to/from stand Pt Will Transfer to Toilet: with supervision;ambulating;bedside commode (over toilet) Pt Will Perform Toileting - Clothing Manipulation and hygiene: with supervision;sit to/from stand Additional ADL Goal #1: Pt will perform bed mobility at a modified independent level.  OT Frequency: Min 2X/week   Barriers to D/C:            Co-evaluation PT/OT/SLP Co-Evaluation/Treatment: Yes Reason for Co-Treatment: For patient/therapist safety   OT goals addressed during session: ADL's and self-care      End of Session Equipment Utilized During Treatment: Rolling walker;Gait belt  Activity Tolerance: Patient tolerated treatment well Patient left: in chair;with call bell/phone within  reach;with chair alarm set;with family/visitor present   Time: 4129-0475 OT Time Calculation (min): 39 min Charges:  OT General Charges $OT Visit: 1 Procedure OT Evaluation $OT Eval Moderate Complexity: 1 Procedure OT Treatments $Self Care/Home Management : 8-22 mins G-Codes:    Malka So 10/10/2015, 3:44 PM  [phone]

## 2015-10-10 NOTE — Progress Notes (Addendum)
Initial Nutrition Assessment  DOCUMENTATION CODES:   Not applicable  INTERVENTION:    Boost Breeze po TID with meals, each supplement provides 250 kcal and 9 grams of protein  NUTRITION DIAGNOSIS:   Increased nutrient needs related to  (healing & recovery) as evidenced by estimated needs  GOAL:   Patient will meet greater than or equal to 90% of their needs  MONITOR:   PO intake, Supplement acceptance, Labs, Weight trends, Skin, I & O's  REASON FOR ASSESSMENT:   Consult Assessment of nutrition requirement/status  ASSESSMENT:   80 y.o. Female with history of multiple myeloma followed by Dr. Julien Nordmann, cardiomyopathy, diabetes mellitus with peripheral neuropathy, hypertension. Patient lives with son. Used a walker prior to admission. One level home with rehabilitation. Question 24/ 7 assist on discharge. Presented 10/09/2015 with a gangrenous left foot and no options for revascularization. Limb was not felt to be salvageable. Underwent left BKA 10/09/2015 per Dr. [name]. Hospital course pain management. Acute blood loss anemia at 10.3 and monitored. Physical and occupational therapy evaluations pending. M.D. has requested physical medicine rehabilitation consult.  Patient s/p procedure 2/7: LEFT BELOW THE KNEE AMPTUATION  RD spoke with patient and patient's daughters at bedside.  Pt reports her appetite hasn't been very good.  Typically consumes 2-3 meals per day.  Unsure if she's lost weight or not.  Nutrient needs increased given post-op state.  Pt reports Ensure Enlive "tears my stomach up".  RD suggested trying another supplement on our formulary.  Pt seemed agreeable.  RD unable to complete Nutrition Focused Physical Exam at this time.  Diet Order:  Diet Carb Modified Fluid consistency:: Thin; Room service appropriate?: Yes  Skin:  Reviewed, no issues  Last BM:  2/6  Height:   Ht Readings from Last 1 Encounters:  10/09/15 5' (1.524 m)    Weight:   Wt  Readings from Last 1 Encounters:  10/09/15 109 lb 5.6 oz (49.6 kg)    CBG (last 3)   Recent Labs  10/09/15 0903 10/10/15 0855 10/10/15 1118  GLUCAP 144* 157* 125*    Ideal Body Weight:  45.4 kg  BMI:  Body mass index is 21.36 kg/(m^2).  Estimated Nutritional Needs:   Kcal:  1300-1500  Protein:  65-75 gm  Fluid:  >/= 1.5 L  EDUCATION NEEDS:   No education needs identified at this time  Arthur Holms, RD, LDN Pager #: [phone] After-Hours Pager #: [phone]

## 2015-10-10 NOTE — Progress Notes (Addendum)
[facility]  Subjective  - Resting on/off last night.  Daughter at bedside.   Objective 121/55 96 100.3 F (37.9 C) (Oral) 18 100%  Intake/Output Summary (Last 24 hours) at 10/10/15 5183 Last data filed at 10/10/15 0454  Gross per 24 hour  Intake   1215 ml  Output   2000 ml  Net   -785 ml    Left BKA dressing C/D/I  Assessment/Planning: POD # 1left BKA  We will restart coumadin today and plavix PT for mobility and possible discharge to CIR verses SNF Plan to change dressing tomorrow   Laurence Slate [facility] 10/10/2015 7:22 AM --  Laboratory Lab Results:  Recent Labs  10/09/15 0657 10/10/15 0537  WBC 6.4 7.8  HGB 10.3* 8.7*  HCT 33.4* 28.3*  PLT 270 235   BMET  Recent Labs  10/09/15 0657 10/10/15 0537  NA 137 136  K 4.2 4.1  CL 101 102  CO2 23 25  GLUCOSE 143* 209*  BUN 27* 9  CREATININE 1.04* 0.83  CALCIUM 9.2 8.3*    COAG Lab Results  Component Value Date   INR 1.19 10/10/2015   INR 1.19 10/09/2015   INR 3.16* 09/21/2015   PROTIME 16.8* 09/04/2015   PROTIME 13.2 08/28/2015   PROTIME 15.6* 08/21/2015   No results found for: PTT  Agree with above. Dressing change tomorrow.  [name], MD, [place] [phone] Office: [phone]

## 2015-10-10 NOTE — Care Management Note (Signed)
Case Management Note  Patient Details  Name: [name] MRN: [id number] Date of Birth: Jan 24, 1936  Subjective/Objective:   80 y.o. F admitted 10/09/2015 with gangrenous L foot that was not salvageable. POD #1 L BKA. Hx Multiple Myeloma followed by Dr Earlie Server. DM with peripheral Neuropathy. Lives with Son. CIR is following.                Action/Plan: Await PT/OT evaluation for pts ability to tolerate CIR.    Expected Discharge Date:                  Expected Discharge Plan:  [place] (CIR vs SNF)  In-House Referral:     Discharge planning Services  CM Consult  Post Acute Care Choice:    Choice offered to:     DME Arranged:    DME Agency:     HH Arranged:    HH Agency:     Status of Service:  In process, will continue to follow  Medicare Important Message Given:    Date Medicare IM Given:    Medicare IM give by:    Date Additional Medicare IM Given:    Additional Medicare Important Message give by:     If discussed at [place] of Stay Meetings, dates discussed:    Additional Comments:  Delrae Sawyers, RN 10/10/2015, 3:19 PM

## 2015-10-10 NOTE — Progress Notes (Signed)
Inpatient Rehabilitation  See IP Rehab MD consult for details; I await PT and OT evaluations prior to following up with patient.  Carmelia Roller., CCC/SLP Admission Coordinator  [place]  Cell [phone]

## 2015-10-10 NOTE — Evaluation (Signed)
Physical Therapy Evaluation Patient Details Name: [name] MRN: [id number] DOB: Jan 27, 1936 Today's Date: 10/10/2015   History of Present Illness  S/P L BKA due to PVD and non healing wound. PMH:  HTN, multiple myeloma, DM.  Clinical Impression  Pt admitted with/for non-healing wound, s/p L BKA.   Pt moving as well as can be expect on first day, but is very anxious.  Pt currently limited functionally due to the problems listed. ( See problems list.)   Pt will benefit from PT to maximize function and safety in order to get ready for next venue listed below.     Follow Up Recommendations CIR;Supervision/Assistance - 24 hour    Equipment Recommendations  Other (comment) (TBA)    Recommendations for Other Services Rehab consult     Precautions / Restrictions Precautions Precautions: Fall Restrictions Weight Bearing Restrictions: No      Mobility  Bed Mobility Overal bed mobility: Needs Assistance Bed Mobility: Supine to Sit     Supine to sit: Mod assist     General bed mobility comments: verbal cues for technique, assist to raise trunk  Transfers Overall transfer level: Needs assistance Equipment used: Rolling walker (2 wheeled) Transfers: Sit to/from [employer] Transfers Sit to Stand: +2 physical assistance;Min assist Stand pivot transfers: +2 physical assistance;Mod assist       General transfer comment: cues for hand placement, to rise, for stability and to move walker as she pivoted on her R foot  Ambulation/Gait             General Gait Details: unable, pt also unable to do "swing to " to hop to chair.  She had to pivot her foot.  Stairs            Wheelchair Mobility    Modified Rankin (Stroke Patients Only)       Balance Overall balance assessment: Needs assistance Sitting-balance support: Single extremity supported;Bilateral upper extremity supported Sitting balance-Leahy Scale: Fair Sitting balance - Comments: pt initially  heavily reliant on UEs, with wide range reaching trials, pt eventually able to sit unsupported, but still anxious Postural control: Posterior lean   Standing balance-Leahy Scale: Poor Standing balance comment: reliant on UE and device                             Pertinent Vitals/Pain Pain Assessment: Faces Faces Pain Scale: Hurts little more Pain Location: L LE Pain Descriptors / Indicators: Operative site guarding;Sore;Grimacing Pain Intervention(s): Monitored during session    Home Living Family/patient expects to be discharged to:: Private residence Living Arrangements: Children (son) Available Help at Discharge: Family;Available 24 hours/day Type of Home: House Home Access: Ramped entrance     Home Layout: One level Home Equipment: Walker - 2 wheels;Bedside commode Additional Comments: son is w/c bound, daughter [name] is looking into 24 hour care at home    Prior Function Level of Independence: Needs assistance   Gait / Transfers Assistance Needed: Walking household distances with RW.  ADL's / Homemaking Assistance Needed: Sponge bathed, dressed, self fed, toileted modified independently. Performed light housekeeping and laundry, Son did houskeeping  Comments: family helped with medications     Hand Dominance   Dominant Hand: Right    Extremity/Trunk Assessment   Upper Extremity Assessment: Defer to OT evaluation           Lower Extremity Assessment: RLE deficits/detail;LLE deficits/detail RLE Deficits / Details: generally functional, grossly >4/5 LLE Deficits /  Details: moves against gravity and actively in hip flex/ext abd/add and resisted extension     Communication   Communication: HOH  Cognition Arousal/Alertness: Awake/alert Behavior During Therapy: Anxious Overall Cognitive Status: History of cognitive impairments - at baseline (per daughter, [name], pt is close to her baseline)       Memory: Decreased short-term memory               General Comments      Exercises        Assessment/Plan    PT Assessment Patient needs continued PT services  PT Diagnosis Difficulty walking;Acute pain;Generalized weakness   PT Problem List Decreased strength;Decreased activity tolerance;Decreased balance;Decreased mobility;Decreased knowledge of use of DME;Pain  PT Treatment Interventions Gait training;DME instruction;Functional mobility training;Therapeutic activities;Balance training;Patient/family education   PT Goals (Current goals can be found in the Care Plan section) Acute Rehab PT Goals Patient Stated Goal: Return to independence PT Goal Formulation: With patient Time For Goal Achievement: 10/24/15 Potential to Achieve Goals: Good    Frequency Min 3X/week   Barriers to discharge        Co-evaluation PT/OT/SLP Co-Evaluation/Treatment: Yes Reason for Co-Treatment: For patient/therapist safety PT goals addressed during session: Mobility/safety with mobility OT goals addressed during session: ADL's and self-care       End of Session   Activity Tolerance: Patient tolerated treatment well;Other (comment) (limited by pain and anxiety.) Patient left: in chair;with call bell/phone within reach Nurse Communication: Mobility status         Time: 5790-3833 PT Time Calculation (min) (ACUTE ONLY): 24 min   Charges:   PT Evaluation $PT Eval Moderate Complexity: 1 Procedure     PT G Codes:        [name], [name] 10/10/2015, 4:26 PM  10/10/2015  [name], PT [phone]  (pager)

## 2015-10-11 ENCOUNTER — Telehealth: Payer: Self-pay

## 2015-10-11 ENCOUNTER — Telehealth: Payer: Self-pay | Admitting: Vascular Surgery

## 2015-10-11 LAB — BASIC METABOLIC PANEL
Anion gap: 9 (ref 5–15)
BUN: 8 mg/dL (ref 6–20)
CO2: 25 mmol/L (ref 22–32)
Calcium: 8.5 mg/dL — ABNORMAL LOW (ref 8.9–10.3)
Chloride: 106 mmol/L (ref 101–111)
Creatinine, Ser: 0.75 mg/dL (ref 0.44–1.00)
GFR calc Af Amer: >60 mL/min (ref >60)
GLUCOSE: 144 mg/dL — AB (ref 65–99)
POTASSIUM: 3.2 mmol/L — AB (ref 3.5–5.1)
Sodium: 140 mmol/L (ref 135–145)

## 2015-10-11 LAB — CBC
HEMATOCRIT: 27.2 % — AB (ref 36.0–46.0)
Hemoglobin: 8.9 g/dL — ABNORMAL LOW (ref 12.0–15.0)
MCH: 31 pg (ref 26.0–34.0)
MCHC: 32.7 g/dL (ref 30.0–36.0)
MCV: 94.8 fL (ref 78.0–100.0)
Platelets: 207 10*3/uL (ref 150–400)
RBC: 2.87 MIL/uL — ABNORMAL LOW (ref 3.87–5.11)
RDW: 15 % (ref 11.5–15.5)
WBC: 6.2 10*3/uL (ref 4.0–10.5)

## 2015-10-11 LAB — GLUCOSE, CAPILLARY
GLUCOSE-CAPILLARY: 214 mg/dL — AB (ref 65–99)
GLUCOSE-CAPILLARY: 116 mg/dL — AB (ref 65–99)
Glucose-Capillary: 141 mg/dL — ABNORMAL HIGH (ref 65–99)
Glucose-Capillary: 194 mg/dL — ABNORMAL HIGH (ref 65–99)

## 2015-10-11 LAB — PROTIME-INR
INR: 1.21 (ref 0.00–1.49)
Prothrombin Time: 15.5 seconds — ABNORMAL HIGH (ref 11.6–15.2)

## 2015-10-11 LAB — HEMOGLOBIN A1C
Hgb A1c MFr Bld: 6.6 % — ABNORMAL HIGH (ref 4.8–5.6)
MEAN PLASMA GLUCOSE: 143 mg/dL

## 2015-10-11 NOTE — H&P (Signed)
Physical Medicine and Rehabilitation Admission H&P    Chief complaint: Stump pain   HPI: [name] is a 80 y.o. right handed female with history of multiple myeloma diagnosed 2009 and has relapsed followed by Dr. Julien Nordmann as patient had been receiving chemotherapy with first cycle 09/05/2014 15 cycles , cardiomyopathy, diabetes mellitus with peripheral neuropathy, hypertension, history of bilateral lower extremity DVTs maintained on chronic Coumadin, chronic pain as patient was using oxycodone 10 mg 4 times daily prior to admission.. Patient lives with son who is in a wheelchair. 2 daughters in the area and one of those daughters has had a stroke. Used a walker prior to admission. One level home with rehabilitation. Question 24/ 7 assist on discharge. Presented 10/09/2015 with a gangrenous left foot and no options for revascularization. Limb was not felt to be salvageable. Underwent left BKA 10/09/2015 per Dr. [name]. Hospital course pain management. Acute blood loss anemia at 8.9 and monitored. Chronic Coumadin has been resumed. Physical and occupational therapy evaluations completed with recommendations of physical medicine rehabilitation consult. Patient was admitted for a comprehensive rehabilitation program  ROS Constitutional: Negative for fever and chills.  HENT: Negative for hearing loss.  Eyes: Negative for blurred vision and double vision.  Respiratory: Negative for cough and shortness of breath.  Cardiovascular: Positive for leg swelling. Negative for chest pain and palpitations.  Gastrointestinal: Positive for constipation. Negative for nausea and vomiting.  Genitourinary: Positive for urgency. Negative for dysuria and hematuria.  Musculoskeletal: Positive for myalgias and joint pain.  Skin: Negative for rash.  Neurological: Negative for weakness and headaches.  Psychiatric/Behavioral: Positive for depression.   Anxiety  All other systems reviewed and are  negative    Past Medical History  Diagnosis Date  . Blood transfusion   . Depression     Mild  . History of chicken pox   . Hyperlipidemia   . Hypertension   . Degenerative joint disease   . Phlebitis   . Multiple myeloma     per Dr. Julien Nordmann, s/p palliative radiation for leg pain 2011 per Dr. Sondra Come  . Deep vein thrombosis of bilateral lower extremities (HCC)   . Cardiomyopathy ([facility])   . Diabetes mellitus     Steroid related  . UTI (urinary tract infection) 09/08/2013  . History of radiation therapy 08/30/13-09/20/13    20 gray to lower lumbar/upper sacrum  . Heart murmur   . Anxiety   . Anemia   . Family history of anesthesia complication     DAUGHTER CPR AFTER  . History of shingles    Past Surgical History  Procedure Laterality Date  . Abdominal hysterectomy    . Ankle fracture surgery Left   . Ear cyst excision Left 04/16/2015    Procedure: EXCISION FACIAL CYST LEFT SIDE ;  Surgeon: Izora Gala, MD;  Location: [facility];  Service: ENT;  Laterality: Left;  . Peripheral vascular catheterization  08/14/2015    Procedure: Lower Extremity Intervention;  Surgeon: Katha Cabal, MD;  Location: [facility];  Service: Cardiovascular;;  . Peripheral vascular catheterization N/A 08/14/2015    Procedure: Abdominal Aortogram w/Lower Extremity;  Surgeon: Katha Cabal, MD;  Location: [facility];  Service: Cardiovascular;  Laterality: N/A;  . Colonoscopy    . Eye surgery Bilateral     cataract surgery  . Amputation Left 10/09/2015    Procedure: Left Leg AMPUTATION BELOW KNEE;  Surgeon: Angelia Mould, MD;  Location: [facility]  OR;  Service: Vascular;  Laterality: Left;   Family History  Problem Relation Age of Onset  . Diabetes Mother   . Hypertension Mother   . Arthritis Mother   . Stroke Mother   . Diabetes Father   . Hypertension Father   . Arthritis Father   . Stroke Father   . Cancer Sister     stomach  . Stroke Brother   .  Cancer Brother     prostate <50  . Stroke Daughter   . Stroke Son   . Arthritis Other     Parents  . Cancer Other     Colon, 1st degree relative <60  . Diabetes Other     Parents  . Stroke Other     1st degree relative <60   Social History:  reports that she quit smoking about 42 years ago. She has never used smokeless tobacco. She reports that she does not drink alcohol or use illicit drugs. Allergies: No Known Allergies Medications Prior to Admission  Medication Sig Dispense Refill  . acyclovir (ZOVIRAX) 400 MG tablet TAKE ONE TABLET BY MOUTH TWICE DAILY 60 tablet 0  . amLODipine (NORVASC) 5 MG tablet Take 1 tablet (5 mg total) by mouth daily.    . Calcium Carbonate-Vitamin D 600-400 MG-UNIT per tablet Take 1 tablet by mouth daily.     . cephALEXin (KEFLEX) 500 MG capsule Take 1 capsule (500 mg total) by mouth 3 (three) times daily. 42 capsule 2  . clopidogrel (PLAVIX) 75 MG tablet Take 1 tablet (75 mg total) by mouth daily. 30 tablet 11  . dexamethasone (DECADRON) 4 MG tablet TAKE 10 TABS BY MOUTH WEEKLY ON DAY 1 WITH CHEMOTHERAPY TREATMENT CARFILZOMIB 40 tablet 0  . enoxaparin (LOVENOX) 80 MG/0.8ML injection Inject 0.75 mLs (75 mg total) into the skin daily. 5 Syringe 0  . glipiZIDE (GLUCOTROL XL) 2.5 MG 24 hr tablet TAKE ONE TABLET BY MOUTH ONCE DAILY 90 tablet 1  . lisinopril-hydrochlorothiazide (PRINZIDE,ZESTORETIC) 20-12.5 MG per tablet Take 1 tablet by mouth daily. 90 tablet 3  . loperamide (IMODIUM A-D) 2 MG tablet Take 1 tablet (2 mg total) by mouth 4 (four) times daily as needed for diarrhea or loose stools.    Marland Kitchen oxyCODONE (OXY IR/ROXICODONE) 5 MG immediate release tablet Take  1-2 tablets ('10mg'$  total) by mouth every 6 hours as needed for severe pain 90 tablet 0  . potassium chloride SA (K-DUR,KLOR-CON) 20 MEQ tablet Take 1 tablet (20 mEq total) by mouth daily. 30 tablet 0  . prochlorperazine (COMPAZINE) 10 MG tablet Take 1 tablet (10 mg total) by mouth every 6 (six) hours as  needed. 30 tablet 0  . warfarin (COUMADIN) 5 MG tablet Take 1 tablet (5 mg total) by mouth daily at 6 PM. (Patient taking differently: Take 2.5-5 mg by mouth daily at 6 PM. 2.5 mg on Monday, 5 mg all other days)  0  . temazepam (RESTORIL) 30 MG capsule Take 1 capsule (30 mg total) by mouth at bedtime as needed. 30 capsule 0    Home: Home Living Family/patient expects to be discharged to:: Private residence Living Arrangements: Children (son) Available Help at Discharge: Family, Available 24 hours/day Type of Home: House Home Access: Ramped entrance Home Layout: One level Bathroom Shower/Tub: Chiropodist: Standard Home Equipment: Environmental consultant - 2 wheels, Bedside commode Additional Comments: son is w/c bound, daughter [name] is looking into 24 hour care at home   Functional History: Prior Function Level of Independence:  Needs assistance Gait / Transfers Assistance Needed: Walking household distances with RW. ADL's / Homemaking Assistance Needed: Sponge bathed, dressed, self fed, toileted modified independently. Performed light housekeeping and laundry, Son did houskeeping Comments: family helped with medications  Functional Status:  Mobility: Bed Mobility Overal bed mobility: Needs Assistance Bed Mobility: Supine to Sit Supine to sit: Mod assist General bed mobility comments: verbal cues for technique, assist to raise trunk Transfers Overall transfer level: Needs assistance Equipment used: Rolling walker (2 wheeled) Transfers: Sit to/from Stand, Stand Pivot Transfers Sit to Stand: +2 physical assistance, Min assist Stand pivot transfers: +2 physical assistance, Mod assist General transfer comment: cues for hand placement, to rise, for stability and to move walker as she pivoted on her R foot Ambulation/Gait General Gait Details: unable, pt also unable to do "swing to " to hop to chair.  She had to pivot her foot.    ADL: ADL Overall ADL's : Needs  assistance/impaired Eating/Feeding: Independent, Bed level Grooming: Supervision/safety, Sitting Upper Body Bathing: Sitting, Min guard Lower Body Bathing: Maximal assistance, Sit to/from stand Upper Body Dressing : Sitting, Min guard Lower Body Dressing: Maximal assistance, Sit to/from stand Toilet Transfer: +2 for physical assistance, Moderate assistance, RW, Stand-pivot Toileting- Clothing Manipulation and Hygiene: Maximal assistance, Sit to/from stand  Cognition: Cognition Overall Cognitive Status: History of cognitive impairments - at baseline (per daughter, Maudie Mercury, pt is close to her baseline) Orientation Level: Oriented to person, Oriented to place Cognition Arousal/Alertness: Awake/alert Behavior During Therapy: Anxious Overall Cognitive Status: History of cognitive impairments - at baseline (per daughter, Maudie Mercury, pt is close to her baseline) Memory: Decreased short-term memory  Physical Exam: Blood pressure 116/57, pulse 77, temperature 98.3 F (36.8 C), temperature source Oral, resp. rate 18, height 5' (1.524 m), weight 49.6 kg (109 lb 5.6 oz), SpO2 100 %. Physical Exam  Gen: frail appearing. No distress HENT: oral mucosa pink and moist Head: Normocephalic.  Eyes: EOM are normal.  Neck: Normal range of motion. Neck supple. No thyromegaly present.  Cardiovascular: Normal rate and regular rhythm. no murmur or rubs Respiratory: Effort normal and breath sounds normal. No respiratory distress.  GI: Soft. Bowel sounds are normal. She exhibits no distension. No pain Neurological:  Patient is alert. No CN findings. Oriented and appropriate. Follows basic commands and answer simple questions. UE 4/5 deltoid, biceps, triceps, wrist, HI. RLE HF 3, KE 3, ADF/APF 4/5. LLE: unable to perform SLR from recliner. Sensory exam intact to LT/pain in all 4.  Skin:  BKA site is dressed appropriately tender, wearing limb guard Psych: pleasant and appropriate. quiet   Results for orders placed  or performed during the hospital encounter of 10/09/15 (from the past 48 hour(s))  Glucose, capillary     Status: Abnormal   Collection Time: 10/09/15  9:03 AM  Result Value Ref Range   Glucose-Capillary 144 (H) 65 - 99 mg/dL   Comment 1 Notify RN    Comment 2 Document in Chart   Basic metabolic panel     Status: Abnormal   Collection Time: 10/10/15  5:37 AM  Result Value Ref Range   Sodium 136 135 - 145 mmol/L   Potassium 4.1 3.5 - 5.1 mmol/L   Chloride 102 101 - 111 mmol/L   CO2 25 22 - 32 mmol/L   Glucose, Bld 209 (H) 65 - 99 mg/dL   BUN 9 6 - 20 mg/dL   Creatinine, Ser 0.83 0.44 - 1.00 mg/dL   Calcium 8.3 (L) 8.9 - 10.3 mg/dL  GFR calc non Af Amer >60 >60 mL/min   GFR calc Af Amer >60 >60 mL/min    Comment: (NOTE) The eGFR has been calculated using the CKD EPI equation. This calculation has not been validated in all clinical situations. eGFR's persistently <60 mL/min signify possible Chronic Kidney Disease.    Anion gap 9 5 - 15  CBC     Status: Abnormal   Collection Time: 10/10/15  5:37 AM  Result Value Ref Range   WBC 7.8 4.0 - 10.5 K/uL   RBC 2.99 (L) 3.87 - 5.11 MIL/uL   Hemoglobin 8.7 (L) 12.0 - 15.0 g/dL   HCT 28.3 (L) 36.0 - 46.0 %   MCV 94.6 78.0 - 100.0 fL   MCH 29.1 26.0 - 34.0 pg   MCHC 30.7 30.0 - 36.0 g/dL   RDW 15.0 11.5 - 15.5 %   Platelets 235 150 - 400 K/uL  Protime-INR     Status: Abnormal   Collection Time: 10/10/15  5:37 AM  Result Value Ref Range   Prothrombin Time 15.3 (H) 11.6 - 15.2 seconds   INR 1.19 0.00 - 1.49  Hemoglobin A1c     Status: Abnormal   Collection Time: 10/10/15  8:50 AM  Result Value Ref Range   Hgb A1c MFr Bld 6.6 (H) 4.8 - 5.6 %    Comment: (NOTE)         Pre-diabetes: 5.7 - 6.4         Diabetes: >6.4         Glycemic control for adults with diabetes: <7.0    Mean Plasma Glucose 143 mg/dL    Comment: (NOTE) Performed At: [facility] [place], [place] [id number] Lindon Romp MD  IR:[id number]   Glucose, capillary     Status: Abnormal   Collection Time: 10/10/15  8:55 AM  Result Value Ref Range   Glucose-Capillary 157 (H) 65 - 99 mg/dL  Glucose, capillary     Status: Abnormal   Collection Time: 10/10/15 11:18 AM  Result Value Ref Range   Glucose-Capillary 125 (H) 65 - 99 mg/dL  Glucose, capillary     Status: None   Collection Time: 10/10/15  4:35 PM  Result Value Ref Range   Glucose-Capillary 69 65 - 99 mg/dL  Glucose, capillary     Status: Abnormal   Collection Time: 10/10/15  9:47 PM  Result Value Ref Range   Glucose-Capillary 197 (H) 65 - 99 mg/dL  CBC     Status: Abnormal   Collection Time: 10/11/15  5:40 AM  Result Value Ref Range   WBC 6.2 4.0 - 10.5 K/uL   RBC 2.87 (L) 3.87 - 5.11 MIL/uL   Hemoglobin 8.9 (L) 12.0 - 15.0 g/dL   HCT 27.2 (L) 36.0 - 46.0 %   MCV 94.8 78.0 - 100.0 fL   MCH 31.0 26.0 - 34.0 pg   MCHC 32.7 30.0 - 36.0 g/dL   RDW 15.0 11.5 - 15.5 %   Platelets 207 150 - 400 K/uL  Protime-INR     Status: Abnormal   Collection Time: 10/11/15  5:40 AM  Result Value Ref Range   Prothrombin Time 15.5 (H) 11.6 - 15.2 seconds   INR 1.21 0.00 - 1.49  Glucose, capillary     Status: Abnormal   Collection Time: 10/11/15  6:03 AM  Result Value Ref Range   Glucose-Capillary 141 (H) 65 - 99 mg/dL   *Note: Due to a large number of results and/or encounters  for the requested time period, some results have not been displayed. A complete set of results can be found in Results Review.   No results found.     Medical Problem List and Plan: 1.  Ischemic left lower extremity with gait and mobility secondary to peripheral vascular disease status post left BKA 10/09/2015 2.  DVT Prophylaxis/Anticoagulation: Chronic Coumadin for history of DVT. Monitor for any bleeding episodes 3. Pain Management: Presently on oxycodone. Monitor with increased mobility 4. Multiple myeloma. Follow-up oncology service Dr. Julien Nordmann on plan of care for any further  chemotherapy to be addressed as an outpatient 5. Neuropsych: This patient is capable of making decisions on her own behalf. 6. Skin/Wound Care: Routine skin checks 7. Fluids/Electrolytes/Nutrition: Routine I&O's with follow-up chemistries 8. Acute blood loss anemia. Follow-up CBC 9. Mood/anxiety. Xanax 0.5 mg twice daily as needed. Provide emotional support 10. Diabetes mellitus and peripheral neuropathy. Hemoglobin A1c 6.6. Glucotrol XL 2.5 mg daily. Check blood sugars before meals and at bedtime. Diabetic teaching 11. Hypertension. Lisinopril 20 mg daily, HCTZ 12.5 mg daily, Norvasc 5 mg daily. Monitor with increased mobility  Post Admission Physician Evaluation: 1. Functional deficits secondary  to left limb ischemia s/p left BKA. 2. Patient is admitted to receive collaborative, interdisciplinary care between the physiatrist, rehab nursing staff, and therapy team. 3. Patient's level of medical complexity and substantial therapy needs in context of that medical necessity cannot be provided at a lesser intensity of care such as a SNF. 4. Patient has experienced substantial functional loss from his/her baseline which was documented above under the "Functional History" and "Functional Status" headings.  Judging by the patient's diagnosis, physical exam, and functional history, the patient has potential for functional progress which will result in measurable gains while on inpatient rehab.  These gains will be of substantial and practical use upon discharge  in facilitating mobility and self-care at the household level. 5. Physiatrist will provide 24 hour management of medical needs as well as oversight of the therapy plan/treatment and provide guidance as appropriate regarding the interaction of the two. 6. 24 hour rehab nursing will assist with bladder management, bowel management, safety, skin/wound care, disease management, medication administration, pain management and patient education  and help  integrate therapy concepts, techniques,education, etc. 7. PT will assess and treat for/with: Lower extremity strength, range of motion, stamina, balance, functional mobility, safety, adaptive techniques and equipment, pain mgt, pre-prosthetic education, ego support, community integration.   Goals are: mod I to supervision. 8. OT will assess and treat for/with: ADL's, functional mobility, safety, upper extremity strength, adaptive techniques and equipment, pain mgt, community reintegration, ego support, family/pt education.   Goals are: mod I to set up.. Therapy may not yet proceed with showering this patient. 9. SLP will assess and treat for/with: n/a.  Goals are: n/a. 10. Case Management and Social Worker will assess and treat for psychological issues and discharge planning. 11. Team conference will be held weekly to assess progress toward goals and to determine barriers to discharge. 12. Patient will receive at least 3 hours of therapy per day at least 5 days per week. 13. ELOS: 10-12 days       14. Prognosis:  excellent     Meredith Staggers, MD, [facility] 10/12/2015

## 2015-10-11 NOTE — Clinical Social Work Note (Signed)
Clinical Social Work Assessment  Patient Details  Name: [name] MRN: [id number] Date of Birth: 1936-08-25  Date of referral:  10/11/15               Reason for consult:  Facility Placement                Permission sought to share information with:  Family Supports Permission granted to share information::  Yes, Verbal Permission Granted  Name::     Berline Lopes  Agency::  [facility] SN[facility]  Relationship::  dtr  Contact Information:     Housing/Transportation Living arrangements for the past 2 months:  [place] of Information:  Patient, Adult Children Patient Interpreter Needed:  None Criminal Activity/Legal Involvement Pertinent to Current Situation/Hospitalization:  No - Comment as needed Significant Relationships:  Adult Children Lives with:  Adult Children (disabled son) Do you feel safe going back to the place where you live?  Yes Need for family participation in patient care:  Yes (Comment) (help decision making at this time)  Care giving concerns: pt lives at home with disabled son who is wheelchair bound and unable to provide physical assistance   Social Worker assessment / plan:  CSW spoke with pt and pt dtrs at bedside concerning PT recommendation for rehab prior to return home.  CSW explained SNF vs CIR and need for alternate plan if CIR is unable to admit.    Employment status:  Retired Nurse, adult PT Recommendations:  Inpatient [place] / Referral to community resources:  [place]  Patient/Family's Response to care:  Pt is not sure if she is agreeable to SNF at this time but agrees to referral process being started.  Dtrs appeared to agree with recommendation for rehab prior to returning home and had concerns about sufficient support at home if pt didn't agree to placement.  Patient/Family's Understanding of and Emotional Response to Diagnosis, Current Treatment, and Prognosis:  No  questions or concerns at this time.  Emotional Assessment Appearance:  Appears stated age Attitude/Demeanor/Rapport:  Lethargic Affect (typically observed):  Appropriate Orientation:  Oriented to Self, Oriented to Place Alcohol / Substance use:  Not Applicable Psych involvement (Current and /or in the community):  No (Comment)  Discharge Needs  Concerns to be addressed:  Care Coordination Readmission within the last 30 days:  Yes Current discharge risk:  Physical Impairment Barriers to Discharge:  Continued Medical Work up   [employer], LCSW 10/11/2015, 10:06 AM

## 2015-10-11 NOTE — Progress Notes (Addendum)
Physical Therapy Treatment Patient Details Name: [name] MRN: [id number] DOB: 1936-03-17 Today's Date: 10/11/2015    History of Present Illness S/P L BKA due to PVD and non healing wound. PMH:  HTN, multiple myeloma, DM.    PT Comments    Progressing steadily.  Still rather anxious, needing repetitive cues to get various concepts, but slowly improving at EOB and standing.  Follow Up Recommendations  CIR;Supervision/Assistance - 24 hour     Equipment Recommendations  Other (comment)    Recommendations for Other Services Rehab consult     Precautions / Restrictions Precautions Precautions: Fall    Mobility  Bed Mobility Overal bed mobility: Needs Assistance Bed Mobility: Supine to Sit     Supine to sit: Mod assist     General bed mobility comments: Assist to come up and forward.  Once up she was able to scoot ot EOB with minimal assist  Transfers Overall transfer level: Needs assistance   Transfers: Sit to/from Stand;Stand Pivot Transfers Sit to Stand: +2 physical assistance;Min assist Stand pivot transfers: +2 physical assistance;Mod assist       General transfer comment: cues for hand placement, cues/assist to get pt up over her hands with "locked:" elbows to attempt   "swing or Hop to: during transfer.  Ambulation/Gait             General Gait Details: unable   Stairs            Wheelchair Mobility    Modified Rankin (Stroke Patients Only)       Balance Overall balance assessment: Needs assistance   Sitting balance-Leahy Scale: Fair Sitting balance - Comments: still wanting to list posteriorly, but can come forward and boost herself to scoot forward or back     Standing balance-Leahy Scale: Poor Standing balance comment: 3 trials of standing/pregait working on swinging or hopping forward and back and working on standing tolerance and balance in the [employer] Arousal/Alertness: Awake/alert Behavior  During Therapy: Anxious Overall Cognitive Status: History of cognitive impairments - at baseline       Memory: Decreased short-term memory              Exercises Amputee Exercises Quad Sets: AROM;Left;10 reps;Supine Hip Extension: AROM;Left;10 reps;Supine Hip ABduction/ADduction: AROM;Left;10 reps;Supine Hip Flexion/Marching: AROM;Left;10 reps;Supine Knee Flexion: AROM;Left;10 reps;Supine    General Comments        Pertinent Vitals/Pain Pain Assessment: Faces Faces Pain Scale: Hurts little more Pain Location: L LE Pain Intervention(s): Monitored during session    Home Living                      Prior Function            PT Goals (current goals can now be found in the care plan section) Acute Rehab PT Goals Patient Stated Goal: Return to independence PT Goal Formulation: With patient Time For Goal Achievement: 10/24/15 Potential to Achieve Goals: Good Progress towards PT goals: Progressing toward goals    Frequency  Min 4X/week    PT Plan Current plan remains appropriate;Frequency needs to be updated    Co-evaluation             End of Session   Activity Tolerance: Patient tolerated treatment well Patient left: in chair;with call bell/phone within reach;with family/visitor present     Time: 8250-5397 PT Time Calculation (min) (  ACUTE ONLY): 27 min  Charges:  $Therapeutic Exercise: 8-22 mins $Therapeutic Activity: 8-22 mins                    G Codes:      Vaness Jelinski, Tessie Fass 10/11/2015, 4:34 PM  10/11/2015  Donnella Sham, PT [phone]  (pager)

## 2015-10-11 NOTE — Telephone Encounter (Signed)
S/w pt, dpm

## 2015-10-11 NOTE — Progress Notes (Signed)
   VASCULAR SURGERY ASSESSMENT & PLAN:  * 2 Days Post-Op s/p: Left BKA.   *  Begin daily dressing changes  * Nutrition: Appreciate Nutrition teams help. Boost Breeze po TID with meals.   * PT/ OT eval before considering CIR.   SUBJECTIVE: Pain well controlled.   PHYSICAL EXAM: Filed Vitals:   10/10/15 1427 10/10/15 2149 10/11/15 0014 10/11/15 0435  BP: 112/46 134/43  116/57  Pulse: 81 81  77  Temp: 99.3 F (37.4 C) 100.3 F (37.9 C) 99.4 F (37.4 C) 98.3 F (36.8 C)  TempSrc: Oral Oral  Oral  Resp: '18 18  18  '$ Height:      Weight:      SpO2: 100% 100%  100%   Left BKA looks fine. Dressing changed.  LABS: Lab Results  Component Value Date   WBC 6.2 10/11/2015   HGB 8.9* 10/11/2015   HCT 27.2* 10/11/2015   MCV 94.8 10/11/2015   PLT 207 10/11/2015   Lab Results  Component Value Date   CREATININE 0.75 10/11/2015   Lab Results  Component Value Date   INR 1.21 10/11/2015   PROTIME 16.8* 09/04/2015   CBG (last 3)   Recent Labs  10/10/15 1635 10/10/15 2147 10/11/15 0603  GLUCAP 69 197* 141*    Active Problems:   PAD (peripheral artery disease) ([place])   Gae Gallop Beeper: [phone] 10/11/2015

## 2015-10-11 NOTE — Telephone Encounter (Signed)
-----   Message from Mena Goes, RN sent at 10/09/2015  9:17 AM EST ----- Regarding: schedule   ----- Message -----    From: Angelia Mould, MD    Sent: 10/09/2015   9:02 AM      To: [facility]vs Charge Pool Subject: chage                                          This patient had a left below the knee amputation. Assistant Gerri Lins. The patient will need a follow up visit in 1 month for suture removal. Thank you. CD

## 2015-10-11 NOTE — NC FL2 (Signed)
[place] LEVEL OF CARE SCREENING TOOL     IDENTIFICATION  Patient Name: [name] Birthdate: 21-Apr-1936 Sex: female Admission Date (Current Location): 10/09/2015  [facility] and [place] Number:  Herbalist and Address:  [facility], [place] [address] [place], [place] [address]      Provider Number: [id number]  Attending Physician Name and Address:  Angelia Mould, MD  Relative Name and Phone Number:       Current Level of Care: Hospital Recommended Level of Care: [place] Prior Approval Number:    Date Approved/Denied:   PASRR Number: [id number]  Discharge Plan: SNF    Current Diagnoses: Patient Active Problem List   Diagnosis Date Noted  . PAD (peripheral artery disease) ([place]) 10/09/2015  . Dry gangrene ([place]) 09/13/2015  . Cellulitis 09/09/2015  . PVD (peripheral vascular disease) ([place]) 09/09/2015  . Encounter for antineoplastic chemotherapy 06/26/2015  . Chronic pain 02/07/2015  . Hypokalemia 11/28/2014  . Long term current use of anticoagulant therapy 11/28/2014  . Fissure in skin of foot 11/28/2014  . Sinus bradycardia 09/08/2014  . DNR no code (do not resuscitate) 09/08/2013  . Chronic steroid use 09/08/2013  . Deep vein thrombosis of left lower extremity ([place]) 05/12/2013  . Deep vein thrombosis of right lower extremity ([place]) 05/12/2013  . Left hip pain 05/16/2012  . Gait instability 05/08/2011  . Multiple myeloma ( [place]) 03/29/2010  . Diabetes mellitus without complication ([place]) 50/27/7412  . DEPRESSION, MILD 03/29/2010  . LEG PAIN, RIGHT 03/29/2010  . Hyperlipidemia 05/19/2008  . Essential hypertension 05/19/2008  . Osteoarthritis 05/19/2008    Orientation RESPIRATION BLADDER Height & Weight     Self, Place  Normal Continent Weight: 109 lb 5.6 oz (49.6 kg) Height:  5' (152.4 cm)  BEHAVIORAL SYMPTOMS/MOOD NEUROLOGICAL BOWEL NUTRITION STATUS      Continent Diet (see DC summary)   AMBULATORY STATUS COMMUNICATION OF NEEDS Skin   Extensive Assist Verbally Surgical wounds                       Personal Care Assistance Level of Assistance  Bathing, Dressing Bathing Assistance: Maximum assistance   Dressing Assistance: Maximum assistance     Functional Limitations Info  Sight Sight Info: Impaired        SPECIAL CARE FACTORS FREQUENCY  PT (By licensed PT), OT (By licensed OT)     PT Frequency: 5/wk OT Frequency: 5/wk            Contractures      Additional Factors Info  Code Status, Allergies, Insulin Sliding Scale Code Status Info: FULL Allergies Info: NKA   Insulin Sliding Scale Info: 3/day       Current Medications (10/11/2015):  This is the current hospital active medication list Current Facility-Administered Medications  Medication Dose Route Frequency Provider Last Rate Last Dose  . 0.9 %  sodium chloride infusion   Intravenous Continuous Ulyses Amor, PA-C   Stopped at 10/10/15 1643  . acetaminophen (TYLENOL) tablet 325-650 mg  325-650 mg Oral Q4H PRN Ulyses Amor, PA-C       Or  . acetaminophen (TYLENOL) suppository 325-650 mg  325-650 mg Rectal Q4H PRN Ulyses Amor, PA-C      . acyclovir (ZOVIRAX) tablet 400 mg  400 mg Oral BID Ulyses Amor, PA-C   400 mg at 10/10/15 2211  . ALPRAZolam (XANAX) tablet 0.5 mg  0.5 mg Oral BID PRN  Ulyses Amor, PA-C   0.5 mg at 10/11/15 0010  . alum & mag hydroxide-simeth (MAALOX/MYLANTA) 200-200-20 MG/5ML suspension 15-30 mL  15-30 mL Oral Q2H PRN Ulyses Amor, PA-C      . amLODipine (NORVASC) tablet 5 mg  5 mg Oral Daily Ulyses Amor, PA-C   5 mg at 10/10/15 1140  . bisacodyl (DULCOLAX) EC tablet 5 mg  5 mg Oral Daily PRN Ulyses Amor, PA-C      . calcium-vitamin D (OSCAL WITH D) 500-200 MG-UNIT per tablet 1 tablet  1 tablet Oral Daily Ulyses Amor, PA-C   1 tablet at 10/10/15 1000  . cephALEXin (KEFLEX) capsule 500 mg  500 mg Oral Q12H Ulyses Amor, PA-C   500 mg at 10/10/15 2211   . clopidogrel (PLAVIX) tablet 75 mg  75 mg Oral Daily Ulyses Amor, PA-C   75 mg at 10/10/15 1000  . docusate sodium (COLACE) capsule 100 mg  100 mg Oral Daily Ulyses Amor, PA-C   100 mg at 10/10/15 1000  . enoxaparin (LOVENOX) injection 75 mg  1.5 mg/kg Subcutaneous Q24H Ulyses Amor, PA-C   75 mg at 10/10/15 7867  . feeding supplement (BOOST / RESOURCE BREEZE) liquid 1 Container  1 Container Oral TID BM Angelia Mould, MD   1 Container at 10/10/15 2000  . glipiZIDE (GLUCOTROL XL) 24 hr tablet 2.5 mg  2.5 mg Oral Q breakfast Ulyses Amor, PA-C   2.5 mg at 10/11/15 6720  . guaiFENesin-dextromethorphan (ROBITUSSIN DM) 100-10 MG/5ML syrup 15 mL  15 mL Oral Q4H PRN Ulyses Amor, PA-C      . hydrALAZINE (APRESOLINE) injection 5 mg  5 mg Intravenous Q20 Min PRN Ulyses Amor, PA-C      . lisinopril (PRINIVIL,ZESTRIL) tablet 20 mg  20 mg Oral Daily Ulyses Amor, PA-C   20 mg at 10/10/15 1001   And  . hydrochlorothiazide (MICROZIDE) capsule 12.5 mg  12.5 mg Oral Daily Ulyses Amor, PA-C   12.5 mg at 10/10/15 1001  . insulin aspart (novoLOG) injection 0-9 Units  0-9 Units Subcutaneous TID WC Ulyses Amor, PA-C   1 Units at 10/11/15 9470  . labetalol (NORMODYNE,TRANDATE) injection 10 mg  10 mg Intravenous Q10 min PRN Ulyses Amor, PA-C      . loperamide (IMODIUM) capsule 2 mg  2 mg Oral QID PRN Ulyses Amor, PA-C      . magnesium sulfate IVPB 2 g 50 mL  2 g Intravenous Daily PRN Ulyses Amor, PA-C      . metoprolol (LOPRESSOR) injection 2-5 mg  2-5 mg Intravenous Q2H PRN Ulyses Amor, PA-C      . morphine 2 MG/ML injection 1-2 mg  1-2 mg Intravenous Q1H PRN Elam Dutch, MD   1 mg at 10/10/15 0016  . ondansetron (ZOFRAN) injection 4 mg  4 mg Intravenous Q6H PRN Ulyses Amor, PA-C      . oxyCODONE (Oxy IR/ROXICODONE) immediate release tablet 5-10 mg  5-10 mg Oral Q4H PRN Ulyses Amor, PA-C   5 mg at 10/11/15 0011  . pantoprazole (PROTONIX) EC tablet 40 mg  40 mg  Oral Daily Ulyses Amor, PA-C   40 mg at 10/10/15 1000  . phenol (CHLORASEPTIC) mouth spray 1 spray  1 spray Mouth/Throat PRN Ulyses Amor, PA-C      . potassium chloride SA (K-DUR,KLOR-CON) CR tablet 20 mEq  20 mEq Oral  Daily Ulyses Amor, PA-C   20 mEq at 10/10/15 1000  . potassium chloride SA (K-DUR,KLOR-CON) CR tablet 20-40 mEq  20-40 mEq Oral Daily PRN Ulyses Amor, PA-C      . prochlorperazine (COMPAZINE) tablet 10 mg  10 mg Oral Q6H PRN Ulyses Amor, PA-C      . senna-docusate (Senokot-S) tablet 1 tablet  1 tablet Oral QHS PRN Ulyses Amor, PA-C      . temazepam (RESTORIL) capsule 30 mg  30 mg Oral QHS PRN Ulyses Amor, PA-C      . warfarin (COUMADIN) tablet 5 mg  5 mg Oral q1800 Ulyses Amor, PA-C   5 mg at 10/10/15 1702  . Warfarin - Physician Dosing Inpatient   Does not apply q1800 Ulyses Amor, PA-C       Facility-Administered Medications Ordered in Other Encounters  Medication Dose Route Frequency Provider Last Rate Last Dose  . 0.9 %  sodium chloride infusion   Intravenous Once Curt Bears, MD   Stopped at 03/21/15 1720  . 0.9 %  sodium chloride infusion   Intravenous Once Curt Bears, MD      . 0.9 %  sodium chloride infusion   Intravenous Once Curt Bears, MD   Stopped at 07/31/15 1638     Discharge Medications: Please see discharge summary for a list of discharge medications.  Relevant Imaging Results:  Relevant Lab Results:   Additional Information SS#: [id number]  Cranford Mon, [place]

## 2015-10-11 NOTE — Clinical Social Work Placement (Signed)
   CLINICAL SOCIAL WORK PLACEMENT  NOTE  Date:  10/11/2015  Patient Details  Name: [name] MRN: [id number] Date of Birth: 1935/09/11  Clinical Social Work is seeking post-discharge placement for this patient at the [place] level of care (*CSW will initial, date and re-position this form in  chart as items are completed):  Yes   Patient/family provided with [place] Work Department's list of facilities offering this level of care within the geographic area requested by the patient (or if unable, by the patient's family).  Yes   Patient/family informed of their freedom to choose among providers that offer the needed level of care, that participate in Medicare, Medicaid or managed care program needed by the patient, have an available bed and are willing to accept the patient.  Yes   Patient/family informed of [place]'s ownership interest in [facility] and [facility], as well as of the fact that they are under no obligation to receive care at these facilities.  PASRR submitted to EDS on 10/11/15     PASRR number received on 10/11/15     Existing PASRR number confirmed on       FL2 transmitted to all facilities in geographic area requested by pt/family on 10/11/15     FL2 transmitted to all facilities within larger geographic area on       Patient informed that his/her managed care company has contracts with or will negotiate with certain facilities, including the following:            Patient/family informed of bed offers received.  Patient chooses bed at       Physician recommends and patient chooses bed at      Patient to be transferred to   on  .  Patient to be transferred to facility by       Patient family notified on   of transfer.  Name of family member notified:        PHYSICIAN Please sign FL2     Additional Comment:    _______________________________________________ Cranford Mon, LCSW 10/11/2015, 10:08  AM

## 2015-10-11 NOTE — Telephone Encounter (Signed)
[name] called - pt is currently admitted.  She is inquiring what the plan is for resuming chemotherapy.  Routed to pod 3 for further action

## 2015-10-11 NOTE — Progress Notes (Signed)
Inpatient Rehabilitation  Met with patient and daughters at bedside to discuss post acute rehab options.  They reported that they wanted to check out several options such as IP Rehab and SNF.  I will initiate insurance authorization.  Encouraged patient and family to have patient sit up in chair a couple times today for 1-2 hours each time.  Will continue to follow for therapy tolerance, patient's post acute rehab decision, and medical readiness.    Carmelia Roller., CCC/SLP Admission Coordinator  [place]  Cell [phone]

## 2015-10-12 ENCOUNTER — Inpatient Hospital Stay (HOSPITAL_COMMUNITY)
Admission: AD | Admit: 2015-10-12 | Discharge: 2015-10-22 | DRG: 560 | Disposition: A | Discharge status: Skilled Nursing Facility | Payer: Medicare Other | Source: Intra-hospital | Attending: Physical Medicine & Rehabilitation | Admitting: Physical Medicine & Rehabilitation

## 2015-10-12 DIAGNOSIS — E119 Type 2 diabetes mellitus without complications: Secondary | ICD-10-CM | POA: Diagnosis not present

## 2015-10-12 DIAGNOSIS — C9000 Multiple myeloma not having achieved remission: Secondary | ICD-10-CM | POA: Diagnosis not present

## 2015-10-12 DIAGNOSIS — D500 Iron deficiency anemia secondary to blood loss (chronic): Secondary | ICD-10-CM | POA: Diagnosis not present

## 2015-10-12 DIAGNOSIS — E11649 Type 2 diabetes mellitus with hypoglycemia without coma: Secondary | ICD-10-CM | POA: Diagnosis not present

## 2015-10-12 DIAGNOSIS — Z86718 Personal history of other venous thrombosis and embolism: Secondary | ICD-10-CM | POA: Diagnosis not present

## 2015-10-12 DIAGNOSIS — Z923 Personal history of irradiation: Secondary | ICD-10-CM

## 2015-10-12 DIAGNOSIS — D62 Acute posthemorrhagic anemia: Secondary | ICD-10-CM | POA: Insufficient documentation

## 2015-10-12 DIAGNOSIS — R269 Unspecified abnormalities of gait and mobility: Secondary | ICD-10-CM

## 2015-10-12 DIAGNOSIS — Z7902 Long term (current) use of antithrombotics/antiplatelets: Secondary | ICD-10-CM | POA: Diagnosis not present

## 2015-10-12 DIAGNOSIS — Z9221 Personal history of antineoplastic chemotherapy: Secondary | ICD-10-CM | POA: Diagnosis not present

## 2015-10-12 DIAGNOSIS — G8918 Other acute postprocedural pain: Secondary | ICD-10-CM | POA: Insufficient documentation

## 2015-10-12 DIAGNOSIS — R2681 Unsteadiness on feet: Secondary | ICD-10-CM | POA: Diagnosis present

## 2015-10-12 DIAGNOSIS — Z89511 Acquired absence of right leg below knee: Secondary | ICD-10-CM | POA: Diagnosis not present

## 2015-10-12 DIAGNOSIS — Z7901 Long term (current) use of anticoagulants: Secondary | ICD-10-CM | POA: Diagnosis not present

## 2015-10-12 DIAGNOSIS — C9002 Multiple myeloma in relapse: Secondary | ICD-10-CM | POA: Diagnosis present

## 2015-10-12 DIAGNOSIS — Z79899 Other long term (current) drug therapy: Secondary | ICD-10-CM

## 2015-10-12 DIAGNOSIS — Z89512 Acquired absence of left leg below knee: Secondary | ICD-10-CM

## 2015-10-12 DIAGNOSIS — E1142 Type 2 diabetes mellitus with diabetic polyneuropathy: Secondary | ICD-10-CM

## 2015-10-12 DIAGNOSIS — F411 Generalized anxiety disorder: Secondary | ICD-10-CM | POA: Diagnosis not present

## 2015-10-12 DIAGNOSIS — Z7984 Long term (current) use of oral hypoglycemic drugs: Secondary | ICD-10-CM

## 2015-10-12 DIAGNOSIS — Z87891 Personal history of nicotine dependence: Secondary | ICD-10-CM

## 2015-10-12 DIAGNOSIS — I69993 Ataxia following unspecified cerebrovascular disease: Secondary | ICD-10-CM | POA: Diagnosis not present

## 2015-10-12 DIAGNOSIS — I429 Cardiomyopathy, unspecified: Secondary | ICD-10-CM | POA: Diagnosis not present

## 2015-10-12 DIAGNOSIS — R627 Adult failure to thrive: Secondary | ICD-10-CM | POA: Diagnosis not present

## 2015-10-12 DIAGNOSIS — I10 Essential (primary) hypertension: Secondary | ICD-10-CM | POA: Diagnosis present

## 2015-10-12 DIAGNOSIS — I96 Gangrene, not elsewhere classified: Secondary | ICD-10-CM | POA: Diagnosis not present

## 2015-10-12 DIAGNOSIS — Z4781 Encounter for orthopedic aftercare following surgical amputation: Principal | ICD-10-CM

## 2015-10-12 DIAGNOSIS — G64 Other disorders of peripheral nervous system: Secondary | ICD-10-CM | POA: Diagnosis not present

## 2015-10-12 DIAGNOSIS — F419 Anxiety disorder, unspecified: Secondary | ICD-10-CM

## 2015-10-12 DIAGNOSIS — G8929 Other chronic pain: Secondary | ICD-10-CM | POA: Diagnosis not present

## 2015-10-12 DIAGNOSIS — Z5181 Encounter for therapeutic drug level monitoring: Secondary | ICD-10-CM | POA: Diagnosis not present

## 2015-10-12 DIAGNOSIS — E1140 Type 2 diabetes mellitus with diabetic neuropathy, unspecified: Secondary | ICD-10-CM | POA: Diagnosis not present

## 2015-10-12 DIAGNOSIS — M6281 Muscle weakness (generalized): Secondary | ICD-10-CM | POA: Diagnosis not present

## 2015-10-12 LAB — GLUCOSE, CAPILLARY
GLUCOSE-CAPILLARY: 102 mg/dL — AB (ref 65–99)
GLUCOSE-CAPILLARY: 162 mg/dL — AB (ref 65–99)
Glucose-Capillary: 121 mg/dL — ABNORMAL HIGH (ref 65–99)
Glucose-Capillary: 106 mg/dL — ABNORMAL HIGH (ref 65–99)

## 2015-10-12 LAB — PROTIME-INR
INR: 1.16 (ref 0.00–1.49)
PROTHROMBIN TIME: 15 s (ref 11.6–15.2)

## 2015-10-12 MED ORDER — POTASSIUM CHLORIDE CRYS ER 20 MEQ PO TBCR
20.0000 meq | EXTENDED_RELEASE_TABLET | Freq: Every day | ORAL | Status: DC
Start: 2015-10-13 — End: 2015-10-22
  Administered 2015-10-13 – 2015-10-22 (×10): 20 meq via ORAL
  Filled 2015-10-12 (×10): qty 1

## 2015-10-12 MED ORDER — OXYCODONE HCL 5 MG PO TABS: 5.0000 mg | ORAL_TABLET | ORAL | Status: DC | PRN

## 2015-10-12 MED ORDER — CALCIUM CARBONATE-VITAMIN D 500-200 MG-UNIT PO TABS
1.0000 | ORAL_TABLET | Freq: Every day | ORAL | Status: DC
  Administered 2015-10-13 – 2015-10-22 (×10): 1 via ORAL
  Filled 2015-10-12 (×10): qty 1

## 2015-10-12 MED ORDER — ONDANSETRON HCL 4 MG PO TABS
4.0000 mg | ORAL_TABLET | Freq: Four times a day (QID) | ORAL | Status: DC | PRN
  Administered 2015-10-13: 4 mg via ORAL
  Filled 2015-10-12: qty 1

## 2015-10-12 MED ORDER — ACETAMINOPHEN 325 MG PO TABS: 325.0000 mg | ORAL_TABLET | ORAL | Status: DC | PRN

## 2015-10-12 MED ORDER — CEPHALEXIN 500 MG PO CAPS: 500.0000 mg | ORAL_CAPSULE | Freq: Three times a day (TID) | ORAL | Status: DC

## 2015-10-12 MED ORDER — AMLODIPINE BESYLATE 5 MG PO TABS
5.0000 mg | ORAL_TABLET | Freq: Every day | ORAL | Status: DC
  Administered 2015-10-13 – 2015-10-22 (×10): 5 mg via ORAL
  Filled 2015-10-12 (×10): qty 1

## 2015-10-12 MED ORDER — TEMAZEPAM 7.5 MG PO CAPS
30.0000 mg | ORAL_CAPSULE | Freq: Every evening | ORAL | Status: DC | PRN
  Administered 2015-10-12 – 2015-10-19 (×4): 30 mg via ORAL
  Filled 2015-10-12: qty 4
  Filled 2015-10-12 (×3): qty 2

## 2015-10-12 MED ORDER — OXYCODONE HCL 5 MG PO TABS
5.0000 mg | ORAL_TABLET | ORAL | Status: DC | PRN
  Administered 2015-10-12 – 2015-10-16 (×18): 10 mg via ORAL
  Filled 2015-10-12 (×18): qty 2

## 2015-10-12 MED ORDER — WARFARIN SODIUM 5 MG PO TABS
5.0000 mg | ORAL_TABLET | Freq: Every day | ORAL | Status: DC
  Administered 2015-10-12 – 2015-10-14 (×3): 5 mg via ORAL
  Filled 2015-10-12 (×4): qty 1

## 2015-10-12 MED ORDER — ACYCLOVIR 400 MG PO TABS
400.0000 mg | ORAL_TABLET | Freq: Two times a day (BID) | ORAL | Status: DC
  Administered 2015-10-12 – 2015-10-22 (×20): 400 mg via ORAL
  Filled 2015-10-12 (×22): qty 1

## 2015-10-12 MED ORDER — WARFARIN - PHARMACIST DOSING INPATIENT
Freq: Every day | Status: DC
  Administered 2015-10-14 – 2015-10-21 (×5)

## 2015-10-12 MED ORDER — LISINOPRIL 20 MG PO TABS
20.0000 mg | ORAL_TABLET | Freq: Every day | ORAL | Status: DC
Start: 2015-10-13 — End: 2015-10-13
  Filled 2015-10-12: qty 1

## 2015-10-12 MED ORDER — GLIPIZIDE ER 2.5 MG PO TB24
2.5000 mg | ORAL_TABLET | Freq: Every day | ORAL | Status: DC
  Administered 2015-10-13 – 2015-10-22 (×10): 2.5 mg via ORAL
  Filled 2015-10-12 (×12): qty 1

## 2015-10-12 MED ORDER — HYDROCHLOROTHIAZIDE 12.5 MG PO CAPS
12.5000 mg | ORAL_CAPSULE | Freq: Every day | ORAL | Status: DC
Start: 2015-10-13 — End: 2015-10-13
  Filled 2015-10-12: qty 1

## 2015-10-12 MED ORDER — CLOPIDOGREL BISULFATE 75 MG PO TABS
75.0000 mg | ORAL_TABLET | Freq: Every day | ORAL | Status: DC
  Administered 2015-10-13 – 2015-10-22 (×10): 75 mg via ORAL
  Filled 2015-10-12 (×10): qty 1

## 2015-10-12 MED ORDER — PANTOPRAZOLE SODIUM 40 MG PO TBEC
40.0000 mg | DELAYED_RELEASE_TABLET | Freq: Every day | ORAL | Status: DC
  Administered 2015-10-13 – 2015-10-22 (×10): 40 mg via ORAL
  Filled 2015-10-12 (×10): qty 1

## 2015-10-12 MED ORDER — ACETAMINOPHEN 650 MG RE SUPP: 325.0000 mg | RECTAL | Status: DC | PRN

## 2015-10-12 MED ORDER — ENOXAPARIN SODIUM 80 MG/0.8ML ~~LOC~~ SOLN
1.5000 mg/kg | SUBCUTANEOUS | Status: DC
  Administered 2015-10-13 – 2015-10-19 (×7): 75 mg via SUBCUTANEOUS
  Filled 2015-10-12 (×7): qty 0.8

## 2015-10-12 MED ORDER — ALPRAZOLAM 0.25 MG PO TABS
0.5000 mg | ORAL_TABLET | Freq: Two times a day (BID) | ORAL | Status: DC | PRN
  Administered 2015-10-14: 0.5 mg via ORAL
  Filled 2015-10-12: qty 2

## 2015-10-12 MED ORDER — ACETAMINOPHEN 325 MG PO TABS
325.0000 mg | ORAL_TABLET | ORAL | Status: DC | PRN
  Administered 2015-10-18 – 2015-10-20 (×2): 650 mg via ORAL
  Administered 2015-10-20: 325 mg via ORAL
  Administered 2015-10-21: 650 mg via ORAL
  Filled 2015-10-12 (×5): qty 2

## 2015-10-12 MED ORDER — DOCUSATE SODIUM 100 MG PO CAPS
100.0000 mg | ORAL_CAPSULE | Freq: Every day | ORAL | Status: DC
  Administered 2015-10-13 – 2015-10-22 (×10): 100 mg via ORAL
  Filled 2015-10-12 (×10): qty 1

## 2015-10-12 MED ORDER — SORBITOL 70 % SOLN
30.0000 mL | Freq: Every day | Status: DC | PRN
  Filled 2015-10-12: qty 30

## 2015-10-12 MED ORDER — ONDANSETRON HCL 4 MG/2ML IJ SOLN: 4.0000 mg | Freq: Four times a day (QID) | INTRAMUSCULAR | Status: DC | PRN

## 2015-10-12 MED ORDER — CEPHALEXIN 250 MG PO CAPS
500.0000 mg | ORAL_CAPSULE | Freq: Two times a day (BID) | ORAL | Status: DC
  Administered 2015-10-12 – 2015-10-16 (×9): 500 mg via ORAL
  Filled 2015-10-12 (×10): qty 2

## 2015-10-12 MED ORDER — INSULIN ASPART 100 UNIT/ML ~~LOC~~ SOLN
0.0000 [IU] | Freq: Three times a day (TID) | SUBCUTANEOUS | Status: DC
  Administered 2015-10-13: 3 [IU] via SUBCUTANEOUS
  Administered 2015-10-13: 1 [IU] via SUBCUTANEOUS
  Administered 2015-10-14: 2 [IU] via SUBCUTANEOUS
  Administered 2015-10-15: 1 [IU] via SUBCUTANEOUS
  Administered 2015-10-16: 2 [IU] via SUBCUTANEOUS
  Administered 2015-10-16 – 2015-10-19 (×6): 1 [IU] via SUBCUTANEOUS
  Administered 2015-10-20: 2 [IU] via SUBCUTANEOUS
  Administered 2015-10-20 – 2015-10-22 (×5): 1 [IU] via SUBCUTANEOUS
  Administered 2015-10-22: 2 [IU] via SUBCUTANEOUS

## 2015-10-12 NOTE — Progress Notes (Addendum)
[facility]  Subjective  - Rested well last night, son in the room this am.  Objective 89/46 63 99 F (37.2 C) (Oral) 16 100%  Intake/Output Summary (Last 24 hours) at 10/12/15 0756 Last data filed at 10/12/15 0100  Gross per 24 hour  Intake    480 ml  Output    400 ml  Net     80 ml    Left BKA limb guard in place  Assessment/Planning: POD # 3 Left BKA  Pending SNF discharge   Laurence Slate [facility] 10/12/2015 7:56 AM --  Agree with above.  PTx recommends CIR  [name], MD, [place] [phone] Office: [phone]

## 2015-10-12 NOTE — Progress Notes (Signed)
Gunnar Fusi Rehab Admission Coordinator Signed Physical Medicine and Rehabilitation PMR Pre-admission 10/12/2015 1:59 PM  Related encounter: Admission (Current) from 10/09/2015 in [facility] Collapse All   PMR Admission Coordinator Pre-Admission Assessment  Patient: [name] is an 80 y.o., female MRN: [id number] DOB: [birth year] Height: 5' (152.4 cm) Weight: 49.6 kg (109 lb 5.6 oz)  Insurance Information HMO: Yes PPO: PCP: IPA: 80/20: OTHER:  PRIMARY: U[employer] Medicare Policy#: [id number] Subscriber: Self CM Name: Approval from, Orvan July Phone#: [phone] Fax#: [phone] CM Name: Follow up, Sherlynn Stalls with EMR access Phone#: [phone] Fax#: [phone] Pre-Cert#: [id number] Employer: Retired Benefits: Phone #: [phone] Name: verified online Eff. Date: 09/02/15 Deduct: $0 Out of Pocket Max: $4900 Life Max:  CIR: $345 days 1-5; $0 days 6- SNF: $0 days 1-20; $160 days 21-51; $0 days 52-100 Outpatient: OT/PT/SLP Co-Pay: $40 Home Health: OT/PT/SLP Co-Pay: $0 DME: 80% Co-Pay: 20% Providers: in network  Medicaid Application Date: Case Manager:  Disability Application Date: Case Worker:   Emergency Facilities manager Information    Name Relation Home Work [place] Daughter [phone]  [phone]   Cindy, [place] Daughter [phone]   Carlisle Cater  [phone]       Current Medical History  Patient Admitting Diagnosis: Left below-knee amputation secondary to peripheral vascular disease  History of Present Illness: [name] is a 80 y.o. right handed female with history of multiple myeloma diagnosed  2009 and has relapsed followed by Dr. Julien Nordmann as patient had been receiving chemotherapy with first cycle 09/05/2014 15 cycles , cardiomyopathy, diabetes mellitus with peripheral neuropathy, hypertension, history of bilateral lower extremity DVTs maintained on chronic Coumadin, chronic pain as patient was using oxycodone 10 mg 4 times daily prior to admission.. Patient lives with son who is in a wheelchair. 2 daughters in the area and one of those daughters has had a stroke. Used a walker prior to admission. One level home with rehabilitation. Question 24/ 7 assist on discharge. Presented 10/09/2015 with a gangrenous left foot and no options for revascularization. Limb was not felt to be salvageable. Underwent left BKA 10/09/2015 per Dr. [name]. Hospital course pain management. Acute blood loss anemia at 8.9 and monitored. Chronic Coumadin has been resumed. Physical and occupational therapy evaluations completed with recommendations of physical medicine rehabilitation consult. Patient was admitted for a comprehensive rehabilitation program.     Past Medical History  Past Medical History  Diagnosis Date  . Blood transfusion   . Depression     Mild  . History of chicken pox   . Hyperlipidemia   . Hypertension   . Degenerative joint disease   . Phlebitis   . Multiple myeloma     per Dr. Julien Nordmann, s/p palliative radiation for leg pain 2011 per Dr. Sondra Come  . Deep vein thrombosis of bilateral lower extremities (HCC)   . Cardiomyopathy ([place])   . Diabetes mellitus     Steroid related  . UTI (urinary tract infection) 09/08/2013  . History of radiation therapy 08/30/13-09/20/13    20 gray to lower lumbar/upper sacrum  . Heart murmur   . Anxiety   . Anemia   . Family history of anesthesia complication     DAUGHTER CPR AFTER  . History of shingles     Family History  family history includes Arthritis in her  father, mother, and other; Cancer in her brother, other, and sister; Diabetes in her father, mother, and other;  Hypertension in her father and mother; Stroke in her brother, daughter, father, mother, other, and son.  Prior Rehab/Hospitalizations:  Has the patient had major surgery during 100 days prior to admission? Yes a vascular surgery and then the amputation.  Current Medications   Current facility-administered medications:  . 0.9 % sodium chloride infusion, , Intravenous, Continuous, Ulyses Amor, PA-C, Stopped at 10/10/15 1643 . acetaminophen (TYLENOL) tablet 325-650 mg, 325-650 mg, Oral, Q4H PRN, 650 mg at 10/11/15 2341 **OR** acetaminophen (TYLENOL) suppository 325-650 mg, 325-650 mg, Rectal, Q4H PRN, Ulyses Amor, PA-C . acyclovir (ZOVIRAX) tablet 400 mg, 400 mg, Oral, BID, Emma M Collins, PA-C, 400 mg at 10/12/15 1024 . ALPRAZolam Duanne Moron) tablet 0.5 mg, 0.5 mg, Oral, BID PRN, Ulyses Amor, PA-C, 0.5 mg at 10/11/15 2225 . alum & mag hydroxide-simeth (MAALOX/MYLANTA) 200-200-20 MG/5ML suspension 15-30 mL, 15-30 mL, Oral, Q2H PRN, Ulyses Amor, PA-C . amLODipine (NORVASC) tablet 5 mg, 5 mg, Oral, Daily, Emma M Collins, PA-C, 5 mg at 10/12/15 1026 . bisacodyl (DULCOLAX) EC tablet 5 mg, 5 mg, Oral, Daily PRN, Ulyses Amor, PA-C . calcium-vitamin D (OSCAL WITH D) 500-200 MG-UNIT per tablet 1 tablet, 1 tablet, Oral, Daily, Ulyses Amor, PA-C, 1 tablet at 10/12/15 1024 . cephALEXin (KEFLEX) capsule 500 mg, 500 mg, Oral, Q12H, Ulyses Amor, PA-C, 500 mg at 10/12/15 1024 . clopidogrel (PLAVIX) tablet 75 mg, 75 mg, Oral, Daily, Ulyses Amor, PA-C, 75 mg at 10/12/15 1024 . docusate sodium (COLACE) capsule 100 mg, 100 mg, Oral, Daily, Ulyses Amor, PA-C, 100 mg at 10/12/15 1024 . enoxaparin (LOVENOX) injection 75 mg, 1.5 mg/kg, Subcutaneous, Q24H, Emma M Collins, PA-C, 75 mg at 10/12/15 1023 . feeding supplement (BOOST / RESOURCE BREEZE) liquid 1 Container, 1  Container, Oral, TID BM, Angelia Mould, MD, 1 Container at 10/12/15 1000 . glipiZIDE (GLUCOTROL XL) 24 hr tablet 2.5 mg, 2.5 mg, Oral, Q breakfast, Ulyses Amor, PA-C, 2.5 mg at 10/12/15 0800 . guaiFENesin-dextromethorphan (ROBITUSSIN DM) 100-10 MG/5ML syrup 15 mL, 15 mL, Oral, Q4H PRN, Ulyses Amor, PA-C . hydrALAZINE (APRESOLINE) injection 5 mg, 5 mg, Intravenous, Q20 Min PRN, Ulyses Amor, PA-C . lisinopril (PRINIVIL,ZESTRIL) tablet 20 mg, 20 mg, Oral, Daily, 20 mg at 10/12/15 1026 **AND** hydrochlorothiazide (MICROZIDE) capsule 12.5 mg, 12.5 mg, Oral, Daily, Ulyses Amor, PA-C, 12.5 mg at 10/12/15 1025 . insulin aspart (novoLOG) injection 0-9 Units, 0-9 Units, Subcutaneous, TID WC, Ulyses Amor, PA-C, 3 Units at 10/11/15 1708 . labetalol (NORMODYNE,TRANDATE) injection 10 mg, 10 mg, Intravenous, Q10 min PRN, Ulyses Amor, PA-C . loperamide (IMODIUM) capsule 2 mg, 2 mg, Oral, QID PRN, Ulyses Amor, PA-C . magnesium sulfate IVPB 2 g 50 mL, 2 g, Intravenous, Daily PRN, Ulyses Amor, PA-C . metoprolol (LOPRESSOR) injection 2-5 mg, 2-5 mg, Intravenous, Q2H PRN, Ulyses Amor, PA-C . morphine 2 MG/ML injection 1-2 mg, 1-2 mg, Intravenous, Q1H PRN, Elam Dutch, MD, 1 mg at 10/10/15 0016 . ondansetron (ZOFRAN) injection 4 mg, 4 mg, Intravenous, Q6H PRN, Ulyses Amor, PA-C, 4 mg at 10/11/15 1044 . oxyCODONE (Oxy IR/ROXICODONE) immediate release tablet 5-10 mg, 5-10 mg, Oral, Q4H PRN, Ulyses Amor, PA-C, 10 mg at 10/12/15 0034 . pantoprazole (PROTONIX) EC tablet 40 mg, 40 mg, Oral, Daily, Ulyses Amor, PA-C, 40 mg at 10/12/15 1025 . phenol (CHLORASEPTIC) mouth spray 1 spray, 1 spray, Mouth/Throat, PRN, Ulyses Amor, PA-C . potassium chloride SA (K-DUR,KLOR-CON) CR tablet 20  mEq, 20 mEq, Oral, Daily, Ulyses Amor, PA-C, 20 mEq at 10/12/15 1025 . potassium chloride SA (K-DUR,KLOR-CON) CR tablet 20-40 mEq, 20-40 mEq, Oral, Daily PRN, Ulyses Amor,  PA-C . prochlorperazine (COMPAZINE) tablet 10 mg, 10 mg, Oral, Q6H PRN, Ulyses Amor, PA-C . senna-docusate (Senokot-S) tablet 1 tablet, 1 tablet, Oral, QHS PRN, Ulyses Amor, PA-C . temazepam (RESTORIL) capsule 30 mg, 30 mg, Oral, QHS PRN, Ulyses Amor, PA-C . warfarin (COUMADIN) tablet 5 mg, 5 mg, Oral, q1800, Ulyses Amor, PA-C, 5 mg at 10/11/15 1708 . Warfarin - Physician Dosing Inpatient, , Does not apply, q1800, Ulyses Amor, PA-C  Facility-Administered Medications Ordered in Other Encounters:  . 0.9 % sodium chloride infusion, , Intravenous, Once, Curt Bears, MD, Stopped at 03/21/15 1720 . 0.9 % sodium chloride infusion, , Intravenous, Once, Curt Bears, MD . 0.9 % sodium chloride infusion, , Intravenous, Once, Curt Bears, MD, Stopped at 07/31/15 1638  Patients Current Diet: Diet Carb Modified Fluid consistency:: Thin; Room service appropriate?: Yes  Precautions / Restrictions Precautions Precautions: Fall Restrictions Weight Bearing Restrictions: No   Has the patient had 2 or more falls or a fall with injury in the past year?No  Prior Activity Level Limited Community (1-2x/wk): Patient out in the community a couple times a week with daughter/family for errands and appointments.   Home Assistive Devices / Equipment Home Assistive Devices/Equipment: Wheelchair, Environmental consultant (rolling), Bedside commode/3-in-1, Dentures, Shower chair with back Home Equipment: Walker - 2 wheels, Bedside commode  Prior Device Use: Indicate devices/aids used by the patient prior to current illness, exacerbation or injury? 2 wheel rolling walker  Prior Functional Level Prior Function Level of Independence: Needs assistance Gait / Transfers Assistance Needed: Walking household distances with RW. ADL's / Homemaking Assistance Needed: Sponge bathed, dressed, self fed, toileted modified independently. Performed light housekeeping and laundry, Son did  houskeeping Comments: family helped with medications  Self Care: Did the patient need help bathing, dressing, using the toilet or eating? Needed some help  Indoor Mobility: Did the patient need assistance with walking from room to room (with or without device)? Independent  Stairs: Did the patient need assistance with internal or external stairs (with or without device)? Unknown patient has a ramp  Functional Cognition: Did the patient need help planning regular tasks such as shopping or remembering to take medications? Dependent  Current Functional Level Cognition  Overall Cognitive Status: History of cognitive impairments - at baseline Orientation Level: Oriented to person, Oriented to place   Extremity Assessment (includes Sensation/Coordination)  Upper Extremity Assessment: Defer to OT evaluation  Lower Extremity Assessment: RLE deficits/detail, LLE deficits/detail RLE Deficits / Details: generally functional, grossly >4/5 LLE Deficits / Details: moves against gravity and actively in hip flex/ext abd/add and resisted extension LLE Coordination: decreased fine motor    ADLs  Overall ADL's : Needs assistance/impaired Eating/Feeding: Independent, Bed level Grooming: Wash/dry hands, Wash/dry face, Brushing hair, Sitting, Set up Upper Body Bathing: Sitting, Set up Lower Body Bathing: Maximal assistance, Sit to/from stand Upper Body Dressing : Set up, Sitting Lower Body Dressing: Maximal assistance, Sit to/from stand Toilet Transfer: Moderate assistance, Stand-pivot, RW, BSC Toileting- Clothing Manipulation and Hygiene: Maximal assistance, Sit to/from stand General ADL Comments: Pt with difficulty leaning side to side in chair/ 3 in 1 for pericare. Pt with heavy reliance on B UE in standing on RW.    Mobility  Overal bed mobility: Needs Assistance Bed Mobility: Supine to Sit Supine to sit: Mod assist  General bed mobility comments: Pt in recliner upon arrival.     Transfers  Overall transfer level: Needs assistance Equipment used: Rolling walker (2 wheeled) Transfers: Sit to/from Stand, Stand Pivot Transfers Sit to Stand: Mod assist Stand pivot transfers: Mod assist General transfer comment: Verbal cues for sequencing. Assist to power up.    Ambulation / Gait / Stairs / Wheelchair Mobility  Ambulation/Gait Ambulation/Gait assistance: Mod assist Ambulation Distance (Feet): 5 Feet Assistive device: Rolling walker (2 wheeled) Gait Pattern/deviations: Step-to pattern General Gait Details: small hopping shuffle steps. Able to pull recliner up behind pt to sit after 5 feet. Verbal cues for sequencing. Gait velocity: decreased    Posture / Balance Dynamic Sitting Balance Sitting balance - Comments: still wanting to list posteriorly, but can come forward and boost herself to scoot forward or back Balance Overall balance assessment: Needs assistance Sitting-balance support: Single extremity supported, Bilateral upper extremity supported Sitting balance-Leahy Scale: Fair Sitting balance - Comments: still wanting to list posteriorly, but can come forward and boost herself to scoot forward or back Postural control: Posterior lean Standing balance-Leahy Scale: Poor Standing balance comment: 3 trials of standing/pregait working on swinging or hopping forward and back and working on standing tolerance and balance in the RW    Special needs/care consideration BiPAP/CPAP: No CPM: No Continuous Drip IV: N/A Dialysis: No Days: N/A Life Vest: No Oxygen: No Special Bed: No Trach Size: N/A Wound Vac (area): No Location: N/A Skin: WDL, dry  Bowel mgmt: 10/10/15 Bladder mgmt:: continent  Diabetic mgmt: Yes with diet and oral medications at home PTA     Previous Home Environment Living Arrangements: Children (son) Available Help at Discharge: Family, Available 24 hours/day Type of Home: House Home Layout: One level Home Access:  Ramped entrance Bathroom Shower/Tub: Chiropodist: Standard Additional Comments: son is w/c bound, daughter [name] is looking into 24 hour care at home  Discharge Living Setting Plans for Discharge Living Setting: Patient's home, House Type of Home at Discharge: House Discharge Home Layout: One level Discharge Home Access: [place] entrance Discharge Bathroom Shower/Tub: [place] unit, Curtain Discharge Bathroom Toilet: Standard Discharge Bathroom Accessibility: Yes How Accessible: Accessible via wheelchair, Accessible via walker Does the patient have any problems obtaining your medications?: No  Social/Family/Support Systems Patient Roles: Parent, Other (Comment) (she has 7 children ) Anticipated Caregiver: Primary, daughter, Verner Kopischke Anticipated Caregiver's Contact Information: [phone] Ability/Limitations of Caregiver: [name] works but is aware of need for 24/7 coverage with people who can provide physical assist  Caregiver Availability: Evenings only Discharge Plan Discussed with Primary Caregiver: Yes Is Caregiver In Agreement with Plan?: Yes Does Caregiver/Family have Issues with Lodging/Transportation while Pt is in Rehab?: No   Goals/Additional Needs Patient/Family Goal for Rehab: Supervision-Min assist wheelchair level  Expected length of stay: 10-14 days Cultural Considerations: [employer] Dietary Needs: Carb Mod diet Equipment Needs: TBD Additional Information: Patient has been getting chemo. Now on hold with plans to follow up after discharge home.  Pt/Family Agrees to Admission and willing to participate: Yes Program Orientation Provided & Reviewed with Pt/Caregiver Including Roles & Responsibilities: Yes Additional Information Needs: FYI Patient's family also seeking PACE program placement for her  Information Needs to be Provided By: N/A  Decrease burden of Care through IP rehab admission: No  Possible need for SNF placement  upon discharge: Not anticipated   Patient Condition: This patient's medical and functional status has changed since the consult dated: 10/10/15 @ 0949 in which the Rehabilitation Physician determined and  documented that the patient's condition is appropriate for intensive rehabilitative care in an inpatient rehabilitation facility. See "History of Present Illness" (above) for medical update. Functional changes are: Mod assist stand pivot transfers with use of rolling walker. Patient's medical and functional status update has been discussed with the Rehabilitation physician and patient remains appropriate for inpatient rehabilitation. Will admit to inpatient rehab today.  Preadmission Screen Completed By: Gunnar Fusi, 10/12/2015 1:59 PM ______________________________________________________________________  Discussed status with Dr. Naaman Plummer on 10/12/15 at 1420 and received telephone approval for admission today.  Admission Coordinator: Gunnar Fusi, time 1420/Date 10/12/15          Cosigned by: Meredith Staggers, MD at 10/12/2015 2:46 PM  Revision History     Date/Time User Provider Type Action   10/12/2015 2:46 PM Meredith Staggers, MD Physician Cosign   10/12/2015 2:24 PM Gunnar Fusi Rehab Admission Coordinator Sign

## 2015-10-12 NOTE — H&P (View-Only) (Signed)
Physical Medicine and Rehabilitation Admission H&P    Chief complaint: Stump pain   HPI: [name] is a 80 y.o. right handed female with history of multiple myeloma diagnosed 2009 and has relapsed followed by Dr. Julien Nordmann as patient had been receiving chemotherapy with first cycle 09/05/2014 15 cycles , cardiomyopathy, diabetes mellitus with peripheral neuropathy, hypertension, history of bilateral lower extremity DVTs maintained on chronic Coumadin, chronic pain as patient was using oxycodone 10 mg 4 times daily prior to admission.. Patient lives with son who is in a wheelchair. 2 daughters in the area and one of those daughters has had a stroke. Used a walker prior to admission. One level home with rehabilitation. Question 24/ 7 assist on discharge. Presented 10/09/2015 with a gangrenous left foot and no options for revascularization. Limb was not felt to be salvageable. Underwent left BKA 10/09/2015 per Dr. [name]. Hospital course pain management. Acute blood loss anemia at 8.9 and monitored. Chronic Coumadin has been resumed. Physical and occupational therapy evaluations completed with recommendations of physical medicine rehabilitation consult. Patient was admitted for a comprehensive rehabilitation program  ROS Constitutional: Negative for fever and chills.  HENT: Negative for hearing loss.  Eyes: Negative for blurred vision and double vision.  Respiratory: Negative for cough and shortness of breath.  Cardiovascular: Positive for leg swelling. Negative for chest pain and palpitations.  Gastrointestinal: Positive for constipation. Negative for nausea and vomiting.  Genitourinary: Positive for urgency. Negative for dysuria and hematuria.  Musculoskeletal: Positive for myalgias and joint pain.  Skin: Negative for rash.  Neurological: Negative for weakness and headaches.  Psychiatric/Behavioral: Positive for depression.   Anxiety  All other systems reviewed and are  negative    Past Medical History  Diagnosis Date  . Blood transfusion   . Depression     Mild  . History of chicken pox   . Hyperlipidemia   . Hypertension   . Degenerative joint disease   . Phlebitis   . Multiple myeloma     per Dr. Julien Nordmann, s/p palliative radiation for leg pain 2011 per Dr. Sondra Come  . Deep vein thrombosis of bilateral lower extremities (HCC)   . Cardiomyopathy ([place])   . Diabetes mellitus     Steroid related  . UTI (urinary tract infection) 09/08/2013  . History of radiation therapy 08/30/13-09/20/13    20 gray to lower lumbar/upper sacrum  . Heart murmur   . Anxiety   . Anemia   . Family history of anesthesia complication     DAUGHTER CPR AFTER  . History of shingles    Past Surgical History  Procedure Laterality Date  . Abdominal hysterectomy    . Ankle fracture surgery Left   . Ear cyst excision Left 04/16/2015    Procedure: EXCISION FACIAL CYST LEFT SIDE ;  Surgeon: Izora Gala, MD;  Location: [facility];  Service: ENT;  Laterality: Left;  . Peripheral vascular catheterization  08/14/2015    Procedure: Lower Extremity Intervention;  Surgeon: Katha Cabal, MD;  Location: [facility];  Service: Cardiovascular;;  . Peripheral vascular catheterization N/A 08/14/2015    Procedure: Abdominal Aortogram w/Lower Extremity;  Surgeon: Katha Cabal, MD;  Location: [facility];  Service: Cardiovascular;  Laterality: N/A;  . Colonoscopy    . Eye surgery Bilateral     cataract surgery  . Amputation Left 10/09/2015    Procedure: Left Leg AMPUTATION BELOW KNEE;  Surgeon: Angelia Mould, MD;  Location: [facility] : [facility]  OR;  Service: Vascular;  Laterality: Left;   Family History  Problem Relation Age of Onset  . Diabetes Mother   . Hypertension Mother   . Arthritis Mother   . Stroke Mother   . Diabetes Father   . Hypertension Father   . Arthritis Father   . Stroke Father   . Cancer Sister     stomach  . Stroke Brother   .  Cancer Brother     prostate <50  . Stroke Daughter   . Stroke Son   . Arthritis Other     Parents  . Cancer Other     Colon, 1st degree relative <60  . Diabetes Other     Parents  . Stroke Other     1st degree relative <60   Social History:  reports that she quit smoking about 42 years ago. She has never used smokeless tobacco. She reports that she does not drink alcohol or use illicit drugs. Allergies: No Known Allergies Medications Prior to Admission  Medication Sig Dispense Refill  . acyclovir (ZOVIRAX) 400 MG tablet TAKE ONE TABLET BY MOUTH TWICE DAILY 60 tablet 0  . amLODipine (NORVASC) 5 MG tablet Take 1 tablet (5 mg total) by mouth daily.    . Calcium Carbonate-Vitamin D 600-400 MG-UNIT per tablet Take 1 tablet by mouth daily.     . cephALEXin (KEFLEX) 500 MG capsule Take 1 capsule (500 mg total) by mouth 3 (three) times daily. 42 capsule 2  . clopidogrel (PLAVIX) 75 MG tablet Take 1 tablet (75 mg total) by mouth daily. 30 tablet 11  . dexamethasone (DECADRON) 4 MG tablet TAKE 10 TABS BY MOUTH WEEKLY ON DAY 1 WITH CHEMOTHERAPY TREATMENT CARFILZOMIB 40 tablet 0  . enoxaparin (LOVENOX) 80 MG/0.8ML injection Inject 0.75 mLs (75 mg total) into the skin daily. 5 Syringe 0  . glipiZIDE (GLUCOTROL XL) 2.5 MG 24 hr tablet TAKE ONE TABLET BY MOUTH ONCE DAILY 90 tablet 1  . lisinopril-hydrochlorothiazide (PRINZIDE,ZESTORETIC) 20-12.5 MG per tablet Take 1 tablet by mouth daily. 90 tablet 3  . loperamide (IMODIUM A-D) 2 MG tablet Take 1 tablet (2 mg total) by mouth 4 (four) times daily as needed for diarrhea or loose stools.    Marland Kitchen oxyCODONE (OXY IR/ROXICODONE) 5 MG immediate release tablet Take  1-2 tablets ('10mg'$  total) by mouth every 6 hours as needed for severe pain 90 tablet 0  . potassium chloride SA (K-DUR,KLOR-CON) 20 MEQ tablet Take 1 tablet (20 mEq total) by mouth daily. 30 tablet 0  . prochlorperazine (COMPAZINE) 10 MG tablet Take 1 tablet (10 mg total) by mouth every 6 (six) hours as  needed. 30 tablet 0  . warfarin (COUMADIN) 5 MG tablet Take 1 tablet (5 mg total) by mouth daily at 6 PM. (Patient taking differently: Take 2.5-5 mg by mouth daily at 6 PM. 2.5 mg on Monday, 5 mg all other days)  0  . temazepam (RESTORIL) 30 MG capsule Take 1 capsule (30 mg total) by mouth at bedtime as needed. 30 capsule 0    Home: Home Living Family/patient expects to be discharged to:: Private residence Living Arrangements: Children (son) Available Help at Discharge: Family, Available 24 hours/day Type of Home: House Home Access: Ramped entrance Home Layout: One level Bathroom Shower/Tub: Chiropodist: Standard Home Equipment: Environmental consultant - 2 wheels, Bedside commode Additional Comments: son is w/c bound, daughter [name] is looking into 24 hour care at home   Functional History: Prior Function Level of Independence:  Needs assistance Gait / Transfers Assistance Needed: Walking household distances with RW. ADL's / Homemaking Assistance Needed: Sponge bathed, dressed, self fed, toileted modified independently. Performed light housekeeping and laundry, Son did houskeeping Comments: family helped with medications  Functional Status:  Mobility: Bed Mobility Overal bed mobility: Needs Assistance Bed Mobility: Supine to Sit Supine to sit: Mod assist General bed mobility comments: verbal cues for technique, assist to raise trunk Transfers Overall transfer level: Needs assistance Equipment used: Rolling walker (2 wheeled) Transfers: Sit to/from Stand, Stand Pivot Transfers Sit to Stand: +2 physical assistance, Min assist Stand pivot transfers: +2 physical assistance, Mod assist General transfer comment: cues for hand placement, to rise, for stability and to move walker as she pivoted on her R foot Ambulation/Gait General Gait Details: unable, pt also unable to do "swing to " to hop to chair.  She had to pivot her foot.    ADL: ADL Overall ADL's : Needs  assistance/impaired Eating/Feeding: Independent, Bed level Grooming: Supervision/safety, Sitting Upper Body Bathing: Sitting, Min guard Lower Body Bathing: Maximal assistance, Sit to/from stand Upper Body Dressing : Sitting, Min guard Lower Body Dressing: Maximal assistance, Sit to/from stand Toilet Transfer: +2 for physical assistance, Moderate assistance, RW, Stand-pivot Toileting- Clothing Manipulation and Hygiene: Maximal assistance, Sit to/from stand  Cognition: Cognition Overall Cognitive Status: History of cognitive impairments - at baseline (per daughter, Maudie Mercury, pt is close to her baseline) Orientation Level: Oriented to person, Oriented to place Cognition Arousal/Alertness: Awake/alert Behavior During Therapy: Anxious Overall Cognitive Status: History of cognitive impairments - at baseline (per daughter, Maudie Mercury, pt is close to her baseline) Memory: Decreased short-term memory  Physical Exam: Blood pressure 116/57, pulse 77, temperature 98.3 F (36.8 C), temperature source Oral, resp. rate 18, height 5' (1.524 m), weight 49.6 kg (109 lb 5.6 oz), SpO2 100 %. Physical Exam  Gen: frail appearing. No distress HENT: oral mucosa pink and moist Head: Normocephalic.  Eyes: EOM are normal.  Neck: Normal range of motion. Neck supple. No thyromegaly present.  Cardiovascular: Normal rate and regular rhythm. no murmur or rubs Respiratory: Effort normal and breath sounds normal. No respiratory distress.  GI: Soft. Bowel sounds are normal. She exhibits no distension. No pain Neurological:  Patient is alert. No CN findings. Oriented and appropriate. Follows basic commands and answer simple questions. UE 4/5 deltoid, biceps, triceps, wrist, HI. RLE HF 3, KE 3, ADF/APF 4/5. LLE: unable to perform SLR from recliner. Sensory exam intact to LT/pain in all 4.  Skin:  BKA site is dressed appropriately tender, wearing limb guard Psych: pleasant and appropriate. quiet   Results for orders placed  or performed during the hospital encounter of 10/09/15 (from the past 48 hour(s))  Glucose, capillary     Status: Abnormal   Collection Time: 10/09/15  9:03 AM  Result Value Ref Range   Glucose-Capillary 144 (H) 65 - 99 mg/dL   Comment 1 Notify RN    Comment 2 Document in Chart   Basic metabolic panel     Status: Abnormal   Collection Time: 10/10/15  5:37 AM  Result Value Ref Range   Sodium 136 135 - 145 mmol/L   Potassium 4.1 3.5 - 5.1 mmol/L   Chloride 102 101 - 111 mmol/L   CO2 25 22 - 32 mmol/L   Glucose, Bld 209 (H) 65 - 99 mg/dL   BUN 9 6 - 20 mg/dL   Creatinine, Ser 0.83 0.44 - 1.00 mg/dL   Calcium 8.3 (L) 8.9 - 10.3 mg/dL  GFR calc non Af Amer >60 >60 mL/min   GFR calc Af Amer >60 >60 mL/min    Comment: (NOTE) The eGFR has been calculated using the CKD EPI equation. This calculation has not been validated in all clinical situations. eGFR's persistently <60 mL/min signify possible Chronic Kidney Disease.    Anion gap 9 5 - 15  CBC     Status: Abnormal   Collection Time: 10/10/15  5:37 AM  Result Value Ref Range   WBC 7.8 4.0 - 10.5 K/uL   RBC 2.99 (L) 3.87 - 5.11 MIL/uL   Hemoglobin 8.7 (L) 12.0 - 15.0 g/dL   HCT 28.3 (L) 36.0 - 46.0 %   MCV 94.6 78.0 - 100.0 fL   MCH 29.1 26.0 - 34.0 pg   MCHC 30.7 30.0 - 36.0 g/dL   RDW 15.0 11.5 - 15.5 %   Platelets 235 150 - 400 K/uL  Protime-INR     Status: Abnormal   Collection Time: 10/10/15  5:37 AM  Result Value Ref Range   Prothrombin Time 15.3 (H) 11.6 - 15.2 seconds   INR 1.19 0.00 - 1.49  Hemoglobin A1c     Status: Abnormal   Collection Time: 10/10/15  8:50 AM  Result Value Ref Range   Hgb A1c MFr Bld 6.6 (H) 4.8 - 5.6 %    Comment: (NOTE)         Pre-diabetes: 5.7 - 6.4         Diabetes: >6.4         Glycemic control for adults with diabetes: <7.0    Mean Plasma Glucose 143 mg/dL    Comment: (NOTE) Performed At: [facility] [place], [place] [id number] Lindon Romp MD  [id number]   Glucose, capillary     Status: Abnormal   Collection Time: 10/10/15  8:55 AM  Result Value Ref Range   Glucose-Capillary 157 (H) 65 - 99 mg/dL  Glucose, capillary     Status: Abnormal   Collection Time: 10/10/15 11:18 AM  Result Value Ref Range   Glucose-Capillary 125 (H) 65 - 99 mg/dL  Glucose, capillary     Status: None   Collection Time: 10/10/15  4:35 PM  Result Value Ref Range   Glucose-Capillary 69 65 - 99 mg/dL  Glucose, capillary     Status: Abnormal   Collection Time: 10/10/15  9:47 PM  Result Value Ref Range   Glucose-Capillary 197 (H) 65 - 99 mg/dL  CBC     Status: Abnormal   Collection Time: 10/11/15  5:40 AM  Result Value Ref Range   WBC 6.2 4.0 - 10.5 K/uL   RBC 2.87 (L) 3.87 - 5.11 MIL/uL   Hemoglobin 8.9 (L) 12.0 - 15.0 g/dL   HCT 27.2 (L) 36.0 - 46.0 %   MCV 94.8 78.0 - 100.0 fL   MCH 31.0 26.0 - 34.0 pg   MCHC 32.7 30.0 - 36.0 g/dL   RDW 15.0 11.5 - 15.5 %   Platelets 207 150 - 400 K/uL  Protime-INR     Status: Abnormal   Collection Time: 10/11/15  5:40 AM  Result Value Ref Range   Prothrombin Time 15.5 (H) 11.6 - 15.2 seconds   INR 1.21 0.00 - 1.49  Glucose, capillary     Status: Abnormal   Collection Time: 10/11/15  6:03 AM  Result Value Ref Range   Glucose-Capillary 141 (H) 65 - 99 mg/dL   *Note: Due to a large number of results and/or encounters  for the requested time period, some results have not been displayed. A complete set of results can be found in Results Review.   No results found.     Medical Problem List and Plan: 1.  Ischemic left lower extremity with gait and mobility secondary to peripheral vascular disease status post left BKA 10/09/2015 2.  DVT Prophylaxis/Anticoagulation: Chronic Coumadin for history of DVT. Monitor for any bleeding episodes 3. Pain Management: Presently on oxycodone. Monitor with increased mobility 4. Multiple myeloma. Follow-up oncology service Dr. Julien Nordmann on plan of care for any further  chemotherapy to be addressed as an outpatient 5. Neuropsych: This patient is capable of making decisions on her own behalf. 6. Skin/Wound Care: Routine skin checks 7. Fluids/Electrolytes/Nutrition: Routine I&O's with follow-up chemistries 8. Acute blood loss anemia. Follow-up CBC 9. Mood/anxiety. Xanax 0.5 mg twice daily as needed. Provide emotional support 10. Diabetes mellitus and peripheral neuropathy. Hemoglobin A1c 6.6. Glucotrol XL 2.5 mg daily. Check blood sugars before meals and at bedtime. Diabetic teaching 11. Hypertension. Lisinopril 20 mg daily, HCTZ 12.5 mg daily, Norvasc 5 mg daily. Monitor with increased mobility  Post Admission Physician Evaluation: 1. Functional deficits secondary  to left limb ischemia s/p left BKA. 2. Patient is admitted to receive collaborative, interdisciplinary care between the physiatrist, rehab nursing staff, and therapy team. 3. Patient's level of medical complexity and substantial therapy needs in context of that medical necessity cannot be provided at a lesser intensity of care such as a SNF. 4. Patient has experienced substantial functional loss from his/her baseline which was documented above under the "Functional History" and "Functional Status" headings.  Judging by the patient's diagnosis, physical exam, and functional history, the patient has potential for functional progress which will result in measurable gains while on inpatient rehab.  These gains will be of substantial and practical use upon discharge  in facilitating mobility and self-care at the household level. 5. Physiatrist will provide 24 hour management of medical needs as well as oversight of the therapy plan/treatment and provide guidance as appropriate regarding the interaction of the two. 6. 24 hour rehab nursing will assist with bladder management, bowel management, safety, skin/wound care, disease management, medication administration, pain management and patient education  and help  integrate therapy concepts, techniques,education, etc. 7. PT will assess and treat for/with: Lower extremity strength, range of motion, stamina, balance, functional mobility, safety, adaptive techniques and equipment, pain mgt, pre-prosthetic education, ego support, community integration.   Goals are: mod I to supervision. 8. OT will assess and treat for/with: ADL's, functional mobility, safety, upper extremity strength, adaptive techniques and equipment, pain mgt, community reintegration, ego support, family/pt education.   Goals are: mod I to set up.. Therapy may not yet proceed with showering this patient. 9. SLP will assess and treat for/with: n/a.  Goals are: n/a. 10. Case Management and Social Worker will assess and treat for psychological issues and discharge planning. 11. Team conference will be held weekly to assess progress toward goals and to determine barriers to discharge. 12. Patient will receive at least 3 hours of therapy per day at least 5 days per week. 13. ELOS: 10-12 days       14. Prognosis:  excellent     Meredith Staggers, MD, [facility] 10/12/2015

## 2015-10-12 NOTE — Telephone Encounter (Signed)
Returned call to [name], discussed pt will not have any Chemotherapy treatment until she has recovered and returned home. [name] to call office to upon pt's return home .  MD would like to see pt 2-3 weeks after she has returned home. We will schedule an appt for pt and MD to discuss treatment going forward. No further concerns.  LMOVM-Returned call  to [facility] on [address], regarding Dr. Candise Che inquiry if pt will have chemotherapy. Per MD, pt will not have any chemotherapy until she has fully recovered.

## 2015-10-12 NOTE — Progress Notes (Signed)
10/12/2015 1745 Pt  Transferred to 4MW room 1.  Family at bedside. [place]

## 2015-10-12 NOTE — Interval H&P Note (Signed)
[name] was admitted today to Inpatient Rehabilitation with the diagnosis of left BKA.  [name] history has been reviewed, patient examined, and there is no change in status.  Patient continues to be appropriate for intensive inpatient rehabilitation.  I have reviewed the patient's chart and labs.  Questions were answered to the patient's satisfaction. The PAPE has been reviewed and assessment remains appropriate.  [name] T 10/12/2015, 8:40 PM

## 2015-10-12 NOTE — H&P (Signed)
Pt transferred to Rehab from 2W13. Alert and orientated x 4. Diagnostic specific information provided. Orientated to Rehab expectations, therapy schedule, etc.

## 2015-10-12 NOTE — Progress Notes (Signed)
Inpatient Rehabilitation  Received insurance approval and MD clearance for IP Rehab admission today.  Patient and family are in agreement.  RN CM and CSW notified.  Please call with questions.    Carmelia Roller., CCC/SLP Admission Coordinator  [place]  Cell [phone]

## 2015-10-12 NOTE — Progress Notes (Signed)
Charlett Blake, MD Physician Signed Physical Medicine and Rehabilitation Consult Note 10/10/2015 5:50 AM    Expand All Collapse All        Physical Medicine and Rehabilitation Consult Reason for Consult: Left BKA Referring Physician: Dr. Scot Dock   HPI: [name] is a 80 y.o. right handed female with history of multiple myeloma followed by Dr. Julien Nordmann, cardiomyopathy, diabetes mellitus with peripheral neuropathy, hypertension. Patient lives with son. Used a walker prior to admission. One level home with rehabilitation. Question 24/ 7 assist on discharge. Presented 10/09/2015 with a gangrenous left foot and no options for revascularization. Limb was not felt to be salvageable. Underwent left BKA 10/09/2015 per Dr. [name]. Hospital course pain management. Acute blood loss anemia at 10.3 and monitored. Physical and occupational therapy evaluations pending. M.D. has requested physical medicine rehabilitation consult.  Patient was living at home with her son who is in a wheelchair. She has 2 daughters one who is at bedside and the other who was one of the caregivers. The other daughter has had a stroke and is no longer able to be a caregiver. Patient has ambulated with a walker over the last couple years. She states that she has pain all over the place due to her multiple myeloma. At home she was on oxycodone 10 mg 4 times a day According to oncology notes from January 2017, her multiple myeloma has relapsed.  Review of Systems  Constitutional: Negative for fever and chills.  HENT: Negative for hearing loss.  Eyes: Negative for blurred vision and double vision.  Respiratory: Negative for cough and shortness of breath.  Cardiovascular: Positive for leg swelling. Negative for chest pain and palpitations.  Gastrointestinal: Positive for constipation. Negative for nausea and vomiting.  Genitourinary: Positive for urgency. Negative for dysuria and hematuria.  Musculoskeletal:  Positive for myalgias and joint pain.  Skin: Negative for rash.  Neurological: Negative for weakness and headaches.  Psychiatric/Behavioral: Positive for depression.   Anxiety  All other systems reviewed and are negative.  Past Medical History  Diagnosis Date  . Blood transfusion   . Depression     Mild  . History of chicken pox   . Hyperlipidemia   . Hypertension   . Degenerative joint disease   . Phlebitis   . Multiple myeloma     per Dr. Julien Nordmann, s/p palliative radiation for leg pain 2011 per Dr. Sondra Come  . Deep vein thrombosis of bilateral lower extremities (HCC)   . Cardiomyopathy ([place])   . Diabetes mellitus     Steroid related  . UTI (urinary tract infection) 09/08/2013  . History of radiation therapy 08/30/13-09/20/13    20 gray to lower lumbar/upper sacrum  . Heart murmur   . Anxiety   . Anemia   . Family history of anesthesia complication     DAUGHTER CPR AFTER  . History of shingles    Past Surgical History  Procedure Laterality Date  . Abdominal hysterectomy    . Ankle fracture surgery Left   . Ear cyst excision Left 04/16/2015    Procedure: EXCISION FACIAL CYST LEFT SIDE ; Surgeon: Izora Gala, MD; Location: [facility]; Service: ENT; Laterality: Left;  . Peripheral vascular catheterization  08/14/2015    Procedure: Lower Extremity Intervention; Surgeon: Katha Cabal, MD; Location: [facility]; Service: Cardiovascular;;  . Peripheral vascular catheterization N/A 08/14/2015    Procedure: Abdominal Aortogram w/Lower Extremity; Surgeon: Katha Cabal, MD; Location: [facility]; Service: Cardiovascular;  Laterality: N/A;  . Colonoscopy    . Eye surgery Bilateral     cataract surgery   Family History  Problem Relation Age of Onset  . Diabetes Mother   . Hypertension Mother   .  Arthritis Mother   . Stroke Mother   . Diabetes Father   . Hypertension Father   . Arthritis Father   . Stroke Father   . Cancer Sister     stomach  . Stroke Brother   . Cancer Brother     prostate <50  . Stroke Daughter   . Stroke Son   . Arthritis Other     Parents  . Cancer Other     Colon, 1st degree relative <60  . Diabetes Other     Parents  . Stroke Other     1st degree relative <60   Social History:  reports that she quit smoking about 42 years ago. She has never used smokeless tobacco. She reports that she does not drink alcohol or use illicit drugs. Allergies: No Known Allergies Medications Prior to Admission  Medication Sig Dispense Refill  . acyclovir (ZOVIRAX) 400 MG tablet TAKE ONE TABLET BY MOUTH TWICE DAILY 60 tablet 0  . amLODipine (NORVASC) 5 MG tablet Take 1 tablet (5 mg total) by mouth daily.    . Calcium Carbonate-Vitamin D 600-400 MG-UNIT per tablet Take 1 tablet by mouth daily.     . cephALEXin (KEFLEX) 500 MG capsule Take 1 capsule (500 mg total) by mouth 3 (three) times daily. 42 capsule 2  . clopidogrel (PLAVIX) 75 MG tablet Take 1 tablet (75 mg total) by mouth daily. 30 tablet 11  . dexamethasone (DECADRON) 4 MG tablet TAKE 10 TABS BY MOUTH WEEKLY ON DAY 1 WITH CHEMOTHERAPY TREATMENT CARFILZOMIB 40 tablet 0  . enoxaparin (LOVENOX) 80 MG/0.8ML injection Inject 0.75 mLs (75 mg total) into the skin daily. 5 Syringe 0  . glipiZIDE (GLUCOTROL XL) 2.5 MG 24 hr tablet TAKE ONE TABLET BY MOUTH ONCE DAILY 90 tablet 1  . lisinopril-hydrochlorothiazide (PRINZIDE,ZESTORETIC) 20-12.5 MG per tablet Take 1 tablet by mouth daily. 90 tablet 3  . loperamide (IMODIUM A-D) 2 MG tablet Take 1 tablet (2 mg total) by mouth 4 (four) times daily as needed for diarrhea or loose stools.    Marland Kitchen oxyCODONE (OXY IR/ROXICODONE) 5 MG immediate release  tablet Take 1-2 tablets (81m total) by mouth every 6 hours as needed for severe pain 90 tablet 0  . potassium chloride SA (K-DUR,KLOR-CON) 20 MEQ tablet Take 1 tablet (20 mEq total) by mouth daily. 30 tablet 0  . prochlorperazine (COMPAZINE) 10 MG tablet Take 1 tablet (10 mg total) by mouth every 6 (six) hours as needed. 30 tablet 0  . warfarin (COUMADIN) 5 MG tablet Take 1 tablet (5 mg total) by mouth daily at 6 PM. (Patient taking differently: Take 2.5-5 mg by mouth daily at 6 PM. 2.5 mg on Monday, 5 mg all other days)  0  . temazepam (RESTORIL) 30 MG capsule Take 1 capsule (30 mg total) by mouth at bedtime as needed. 30 capsule 0    Home:    Functional History:   Functional Status:  Mobility:          ADL:    Cognition: Cognition Orientation Level: Oriented to person, Oriented to place, Disoriented to time, Disoriented to situation    Blood pressure 121/55, pulse 96, temperature 100.3 F (37.9 C), temperature source Oral, resp. rate 18, height 5' (1.524 m), weight  49.6 kg (109 lb 5.6 oz), SpO2 100 %. Physical Exam  HENT:  Head: Normocephalic.  Eyes: EOM are normal.  Neck: Normal range of motion. Neck supple. No thyromegaly present.  Cardiovascular: Normal rate and regular rhythm.  Respiratory: Effort normal and breath sounds normal. No respiratory distress.  GI: Soft. Bowel sounds are normal. She exhibits no distension.  Neurological:  Patient is a bit lethargic but arousable. Follows basic commands and answer simple questions  Skin:  BKA site is dressed appropriately tender  Motor strength is 4/5 bilateral deltoid, biceps, triceps, grip, right hip flexor and knee extensor and ankle dorsiflexor Left lower extremity 3 minus hip flexor with hip abduction and adduction 3 minus   Lab Results Last 24 Hours    Results for orders placed or performed during the hospital encounter of 10/09/15 (from the past 24 hour(s))  Glucose, capillary  Status: Abnormal   Collection Time: 10/09/15 6:52 AM  Result Value Ref Range   Glucose-Capillary 129 (H) 65 - 99 mg/dL  APTT Status: None   Collection Time: 10/09/15 6:57 AM  Result Value Ref Range   aPTT 34 24 - 37 seconds  Basic metabolic panel Status: Abnormal   Collection Time: 10/09/15 6:57 AM  Result Value Ref Range   Sodium 137 135 - 145 mmol/L   Potassium 4.2 3.5 - 5.1 mmol/L   Chloride 101 101 - 111 mmol/L   CO2 23 22 - 32 mmol/L   Glucose, Bld 143 (H) 65 - 99 mg/dL   BUN 27 (H) 6 - 20 mg/dL   Creatinine, Ser 1.04 (H) 0.44 - 1.00 mg/dL   Calcium 9.2 8.9 - 10.3 mg/dL   GFR calc non Af Amer 50 (L) >60 mL/min   GFR calc Af Amer 59 (L) >60 mL/min   Anion gap 13 5 - 15  CBC Status: Abnormal   Collection Time: 10/09/15 6:57 AM  Result Value Ref Range   WBC 6.4 4.0 - 10.5 K/uL   RBC 3.46 (L) 3.87 - 5.11 MIL/uL   Hemoglobin 10.3 (L) 12.0 - 15.0 g/dL   HCT 33.4 (L) 36.0 - 46.0 %   MCV 96.5 78.0 - 100.0 fL   MCH 29.8 26.0 - 34.0 pg   MCHC 30.8 30.0 - 36.0 g/dL   RDW 15.1 11.5 - 15.5 %   Platelets 270 150 - 400 K/uL  Protime-INR Status: Abnormal   Collection Time: 10/09/15 6:57 AM  Result Value Ref Range   Prothrombin Time 15.3 (H) 11.6 - 15.2 seconds   INR 1.19 0.00 - 1.49  Glucose, capillary Status: Abnormal   Collection Time: 10/09/15 9:03 AM  Result Value Ref Range   Glucose-Capillary 144 (H) 65 - 99 mg/dL   Comment 1 Notify RN    Comment 2 Document in Chart    *Note: Due to a large number of results and/or encounters for the requested time period, some results have not been displayed. A complete set of results can be found in Results Review.      Imaging Results (Last 48 hours)    No results found.    Assessment/Plan: Diagnosis:Left below-knee amputation secondary to peripheral vascular  disease, Postoperative day #1 1. Does the need for close, 24 hr/day medical supervision in concert with the patient's rehab needs make it unreasonable for this patient to be served in a less intensive setting? Potentially 2. Co-Morbidities requiring supervision/potential complications: Multiple myeloma with recent relapse, diabetes with peripheral neuropathy 3. Due to bladder management, bowel management, safety, skin/wound  care, disease management, medication administration, pain management and patient education, does the patient require 24 hr/day rehab nursing? Yes 4. Does the patient require coordinated care of a physician, rehab nurse, PT, OT to address physical and functional deficits in the context of the above medical diagnosis(es)? Yes Addressing deficits in the following areas: balance, endurance, locomotion, strength, transferring, bowel/bladder control, bathing, dressing, feeding, grooming and toileting 5. Can the patient actively participate in an intensive therapy program of at least 3 hrs of therapy per day at least 5 days per week? No 6. The potential for patient to make measurable gains while on inpatient rehab is not applicable 7. Anticipated functional outcomes upon discharge from inpatient rehab are n/a with PT, n/a with OT, n/a with SLP. 8. Estimated rehab length of stay to reach the above functional goals is:Not applicable 9. Does the patient have adequate social supports and living environment to accommodate these discharge functional goals? Potentially 10. Anticipated D/C setting: unclear given her usual caregiver is no longer able to help her. She does have a son in a wheelchair but is not able to provide any physical assistance. 11. Anticipated post D/C treatments: SNF versus CIR depending on her ability to tolerate inpatient rehabilitation at a more intensive level 12. Overall Rehab/Functional Prognosis: fair  RECOMMENDATIONS: This patient's condition is appropriate for  continued rehabilitative care in the following setting: Will need to assess PT OT participation and assess caregiver abilities Patient has agreed to participate in recommended program. N/A Note that insurance prior authorization may be required for reimbursement for recommended care.  Comment: Concerns about ability to tolerate inpatient rib rotation especially in light of recent relapse of multiple myeloma. Also questions as to when chemotherapy will be restarted.    10/10/2015       Revision History     Date/Time User Provider Type Action   10/10/2015 9:49 AM Charlett Blake, MD Physician Sign   10/10/2015 6:31 AM Cathlyn Parsons, PA-C Physician Assistant [place]   View Details Report       Routing History     Date/Time From To Method   10/10/2015 9:49 AM Charlett Blake, MD Charlett Blake, MD In Basket   10/10/2015 9:49 AM Charlett Blake, MD Curt Bears, MD In [facility]   10/10/2015 9:49 AM Charlett Blake, MD Tonia Ghent, MD In [facility]

## 2015-10-12 NOTE — Progress Notes (Signed)
Occupational Therapy Treatment Patient Details Name: [name] MRN: [id number] DOB: [birth year] Today's Date: 10/12/2015    History of present illness S/P L BKA due to PVD and non healing wound. PMH:  HTN, multiple myeloma, DM.   OT comments  Pt performed UB bathing and dressing in chair in supported sitting with set up. Instructed in leaning side to side for pericare, needs further practice. Pt pivoting from chair to 3 in 1 with mod assist.  Pt much less anxious with mobility. Continues to be a good rehab candidate.  Follow Up Recommendations  CIR    Equipment Recommendations       Recommendations for Other Services      Precautions / Restrictions Precautions Precautions: Fall Restrictions Weight Bearing Restrictions: No       Mobility Bed Mobility               General bed mobility comments: Pt in recliner upon arrival.  Transfers Overall transfer level: Needs assistance Equipment used: Rolling walker (2 wheeled) Transfers: Sit to/from [facility] Sit to Stand: Mod assist Stand pivot transfers: Mod assist       General transfer comment: Verbal cues for sequencing. Assist to power up.    Balance     Sitting balance-Leahy Scale: Fair Sitting balance - Comments: still wanting to list posteriorly, but can come forward and boost herself to scoot forward or back Postural control: Posterior lean   Standing balance-Leahy Scale: Poor                     ADL Overall ADL's : Needs assistance/impaired     Grooming: Wash/dry hands;Wash/dry face;Brushing hair;Sitting;Set up   Upper Body Bathing: Sitting;Set up   Lower Body Bathing: Maximal assistance;Sit to/from stand   Upper Body Dressing : Set up;Sitting       Toilet Transfer: Moderate assistance;Stand-pivot;RW;BSC   Toileting- Clothing Manipulation and Hygiene: Maximal assistance;Sit to/from stand         General ADL Comments: Pt with difficulty leaning side to side in  chair/ 3 in 1 for pericare. Pt with heavy reliance on B UE in standing on RW.      Vision                     Perception     Praxis      Cognition   Behavior During Therapy: WFL for tasks assessed/performed Overall Cognitive Status: History of cognitive impairments - at baseline       Memory: Decreased short-term memory               Extremity/Trunk Assessment               Exercises    Shoulder Instructions       General Comments      Pertinent Vitals/ Pain       Pain Assessment: 0-10 Pain Score: 6  Pain Location: L LE Pain Intervention(s): Monitored during session;Repositioned;Limited activity within patient's tolerance  Home Living                                          Prior Functioning/Environment              Frequency Min 2X/week     Progress Toward Goals  OT Goals(current goals can now be found in the care plan section)  Progress towards  OT goals: Progressing toward goals  Acute Rehab OT Goals Patient Stated Goal: Return to independence  Plan Discharge plan remains appropriate    Co-evaluation                 End of Session Equipment Utilized During Treatment: Rolling walker;Gait belt   Activity Tolerance Patient tolerated treatment well   Patient Left in chair;with call bell/phone within reach (with PT)   Nurse Communication          Time: 9892-1194 OT Time Calculation (min): 19 min  Charges: OT General Charges $OT Visit: 1 Procedure OT Treatments $Self Care/Home Management : 8-22 mins  Malka So 10/12/2015, 11:14 AM [phone]

## 2015-10-12 NOTE — Care Management Important Message (Signed)
Important Message  Patient Details  Name: [name] MRN: [id number] Date of Birth: [birth year]   Medicare Important Message Given:  Yes    Yeira Gulden Abena 10/12/2015, 11:11 AM

## 2015-10-12 NOTE — Progress Notes (Signed)
Patient discussed with unit RNCM. Patient has been accepted at [facility] with plan to move to CIR today.  CSW services will sign off.  Lorie Phenix. Pauline Good, [place]  (coverage)

## 2015-10-12 NOTE — Progress Notes (Signed)
Physical Therapy Treatment Patient Details Name: [name] MRN: [id number] DOB: 09/05/1935 Today's Date: 10/12/2015    History of Present Illness S/P L BKA due to PVD and non healing wound. PMH:  HTN, multiple myeloma, DM.    PT Comments    Excellent progress. Pt ambulated 5 feet with RW and mod assist. Increased trunk stability with sitting and standing.   Follow Up Recommendations  CIR;Supervision/Assistance - 24 hour     Equipment Recommendations  Other (comment)    Recommendations for Other Services Rehab consult     Precautions / Restrictions Precautions Precautions: Fall Restrictions Weight Bearing Restrictions: No    Mobility  Bed Mobility               General bed mobility comments: Pt in recliner upon arrival.  Transfers   Equipment used: Rolling walker (2 wheeled)   Sit to Stand: Mod assist         General transfer comment: Verbal cues for sequencing. Assist to power up.  Ambulation/Gait Ambulation/Gait assistance: Mod assist Ambulation Distance (Feet): 5 Feet Assistive device: Rolling walker (2 wheeled) Gait Pattern/deviations: Step-to pattern Gait velocity: decreased   General Gait Details: small hopping shuffle steps. Able to pull recliner up behind pt to sit after 5 feet. Verbal cues for sequencing.   Stairs            Wheelchair Mobility    Modified Rankin (Stroke Patients Only)       Balance     Sitting balance-Leahy Scale: Fair Sitting balance - Comments: still wanting to list posteriorly, but can come forward and boost herself to scoot forward or back Postural control: Posterior lean   Standing balance-Leahy Scale: Poor                      Cognition Arousal/Alertness: Awake/alert Behavior During Therapy: WFL for tasks assessed/performed Overall Cognitive Status: History of cognitive impairments - at baseline       Memory: Decreased short-term memory              Exercises General  Exercises - Lower Extremity Ankle Circles/Pumps: AROM;Right;10 reps Amputee Exercises Hip ABduction/ADduction: AROM;Right;Left;10 reps;Supine Hip Flexion/Marching: AROM;Left;Right;10 reps;Seated Knee Flexion: AROM;Left;10 reps;Right;Seated    General Comments        Pertinent Vitals/Pain Pain Assessment: 0-10 Pain Score: 6  Pain Location: LLE Pain Intervention(s): Monitored during session    Home Living                      Prior Function            PT Goals (current goals can now be found in the care plan section) Acute Rehab PT Goals Patient Stated Goal: Return to independence PT Goal Formulation: With patient Time For Goal Achievement: 10/24/15 Potential to Achieve Goals: Good Progress towards PT goals: Progressing toward goals    Frequency  Min 4X/week    PT Plan Current plan remains appropriate    Co-evaluation             End of Session Equipment Utilized During Treatment: Gait belt Activity Tolerance: Patient tolerated treatment well Patient left: in chair;with family/visitor present;with call bell/phone within reach;with chair alarm set     Time: 7412-8786 PT Time Calculation (min) (ACUTE ONLY): 32 min  Charges:  $Gait Training: 8-22 mins $Therapeutic Exercise: 8-22 mins                    G  Codes:      [name] 10/12/2015, 10:22 AM

## 2015-10-12 NOTE — Care Management Note (Signed)
Case Management Note  Patient Details  Name: [name] MRN: [id number] Date of Birth: Aug 18, 1936  Subjective/Objective:   80 y.o. F admitted 10/09/2015 with gangrenous L foot that was not salvageable. POD #1 L BKA. Hx Multiple Myeloma followed by Dr Earlie Server. DM with peripheral Neuropathy. Lives with Son. CIR is following.                Action/Plan: Await PT/OT evaluation for pts ability to tolerate CIR.    Expected Discharge Date:     10/12/15             Expected Discharge Plan:  [place] (CIR vs SNF)  In-House Referral:     Discharge planning Services  CM Consult  Post Acute Care Choice:    Choice offered to:     DME Arranged:    DME Agency:     HH Arranged:    [place] Agency:     Status of Service:  Completed, signed off  Medicare Important Message Given:  Yes Date Medicare IM Given:    Medicare IM give by:    Date Additional Medicare IM Given:    Additional Medicare Important Message give by:     If discussed at [place] of Stay Meetings, dates discussed:    Discharge Disposition: IP rehab (CIR)   Additional Comments:  10/12/15- 1100- Marvetta Gibbons- pt has confirmed bed on CIR today- spoke with Melissa from CIR and pt has insurance auth for CIR admission- medically stable- plan to d/c to CIR later today.   Dawayne Patricia, RN 10/12/2015, 1:36 PM

## 2015-10-12 NOTE — Progress Notes (Signed)
ANTICOAGULATION CONSULT NOTE - Initial Consult  Pharmacy Consult for warfarin Indication: history of DVT  No Known Allergies  Patient Measurements: Height: 5' (152.4 cm) Weight: 107 lb 5.8 oz (48.7 kg) IBW/kg (Calculated) : 45.5   Vital Signs: Temp: 98.5 F (36.9 C) (02/10 1800) Temp Source: Oral (02/10 1800) BP: 103/49 mmHg (02/10 1800) Pulse Rate: 76 (02/10 1800)  Labs:  Recent Labs  10/10/15 0537 10/11/15 0540 10/12/15 0240  HGB 8.7* 8.9*  --   HCT 28.3* 27.2*  --   PLT 235 207  --   LABPROT 15.3* 15.5* 15.0  INR 1.19 1.21 1.16  CREATININE 0.83 0.75  --     Estimated Creatinine Clearance: 42.3 mL/min (by C-G formula based on Cr of 0.75).   Medical History: Past Medical History  Diagnosis Date  . Blood transfusion   . Depression     Mild  . History of chicken pox   . Hyperlipidemia   . Hypertension   . Degenerative joint disease   . Phlebitis   . Multiple myeloma     per Dr. Julien Nordmann, s/p palliative radiation for leg pain 2011 per Dr. Sondra Come  . Deep vein thrombosis of bilateral lower extremities (HCC)   . Cardiomyopathy ([place])   . Diabetes mellitus     Steroid related  . UTI (urinary tract infection) 09/08/2013  . History of radiation therapy 08/30/13-09/20/13    20 gray to lower lumbar/upper sacrum  . Heart murmur   . Anxiety   . Anemia   . Family history of anesthesia complication     DAUGHTER CPR AFTER  . History of shingles     Assessment: 72 YOF with history of DVT who was on warfarin PTA, but bridged with Lovenox for planned amputation. Now has been resumed on warfarin per MD while inpatient, now to be dosed per pharmacy while in rehab. Also continuing on treatment dose Lovenox.  INR 1.16 this morning, dose not given prior to transfer. Hgb 8.9, plts 207 yesterday. No bleeding noted.   Goal of Therapy:  INR 2-3 Monitor platelets by anticoagulation protocol: Yes   Plan:  -warfarin '5mg'$  daily for now -daily INR -Lovenox 1.'5mg'$ /kg q24h-  '75mg'$  subQ q24h -follow for s/s bleeding  Kimley Apsey D. Larrisa Cravey, PharmD, BCPS Clinical Pharmacist Pager: [phone] 10/12/2015 6:59 PM

## 2015-10-12 NOTE — Discharge Instructions (Signed)

## 2015-10-12 NOTE — PMR Pre-admission (Signed)
PMR Admission Coordinator Pre-Admission Assessment  Patient: [name] is an 80 y.o., female MRN: [id number] DOB: 07-Feb-1936 Height: 5' (152.4 cm) Weight: 49.6 kg (109 lb 5.6 oz)              Insurance Information HMO: Yes    PPO:      PCP:      IPA:      80/20:      OTHER:  PRIMARY: U[employer] Medicare       Policy#: [id number]      Subscriber: Self CM Name: Approval from, Gweneth Dimitri    Phone#: [phone]    Fax#: [phone] CM Name: Follow up, Penelope Galas with EMR access     Phone#: [phone]     Fax#: [phone] Pre-Cert#: [id number]    Employer: Retired Benefits:  Phone #: [phone]     Name: verified online Eff. Date: 09/02/15     Deduct: $0      Out of Pocket Max: $4900      Life Max:  CIR: $345 days 1-5; $0 days 6-      SNF: $0 days 1-20; $160 days 21-51; $0 days 52-100 Outpatient: OT/PT/SLP     Co-Pay: $40 Home Health: OT/PT/SLP      Co-Pay: $0 DME: 80%     Co-Pay: 20% Providers: in network  Medicaid Application Date:       Case Manager:  Disability Application Date:       Case Worker:   Emergency Conservator, museum/gallery Information    Name Relation Home Work Rose Bud Daughter [phone]  [phone]   [place], [place] Daughter [phone]   Autumn Patty  [phone]       Current Medical History  Patient Admitting Diagnosis: Left below-knee amputation secondary to peripheral vascular disease  History of Present Illness: [name] is a 80 y.o. right handed female with history of multiple myeloma diagnosed 2009 and has relapsed followed by Dr. [name] as patient had been receiving chemotherapy with first cycle 09/05/2014 15 cycles , cardiomyopathy, diabetes mellitus with peripheral neuropathy, hypertension, history of bilateral lower extremity DVTs maintained on chronic Coumadin, chronic pain as patient was using oxycodone 10 mg 4 times daily prior to admission.. Patient lives with son who is in a wheelchair. 2  daughters in the area and one of those daughters has had a stroke. Used a walker prior to admission. One level home with rehabilitation. Question 24/ 7 assist on discharge. Presented 10/09/2015 with a gangrenous left foot and no options for revascularization. Limb was not felt to be salvageable. Underwent left BKA 10/09/2015 per Dr. Waverly Ferrari. Hospital course pain management. Acute blood loss anemia at 8.9 and monitored. Chronic Coumadin has been resumed. Physical and occupational therapy evaluations completed with recommendations of physical medicine rehabilitation consult. Patient was admitted for a comprehensive rehabilitation program.     Past Medical History  Past Medical History  Diagnosis Date  . Blood transfusion   . Depression     Mild  . History of chicken pox   . Hyperlipidemia   . Hypertension   . Degenerative joint disease   . Phlebitis   . Multiple myeloma     per Dr. [name], s/p palliative radiation for leg pain 2011 per Dr. Roselind Messier  . Deep vein thrombosis of bilateral lower extremities (HCC)   . Cardiomyopathy (HCC)   . Diabetes mellitus     Steroid related  . UTI (urinary tract infection) 09/08/2013  . History of radiation therapy 08/30/13-09/20/13  20 gray to lower lumbar/upper sacrum  . Heart murmur   . Anxiety   . Anemia   . Family history of anesthesia complication     DAUGHTER CPR AFTER  . History of shingles     Family History  family history includes Arthritis in her father, mother, and other; Cancer in her brother, other, and sister; Diabetes in her father, mother, and other; Hypertension in her father and mother; Stroke in her brother, daughter, father, mother, other, and son.  Prior Rehab/Hospitalizations:  Has the patient had major surgery during 100 days prior to admission? Yes a vascular surgery and then the amputation.  Current Medications   Current facility-administered medications:  .  0.9 %  sodium chloride infusion, , Intravenous,  Continuous, Ulyses Amor, PA-C, Stopped at 10/10/15 1643 .  acetaminophen (TYLENOL) tablet 325-650 mg, 325-650 mg, Oral, Q4H PRN, 650 mg at 10/11/15 2341 **OR** acetaminophen (TYLENOL) suppository 325-650 mg, 325-650 mg, Rectal, Q4H PRN, Ulyses Amor, PA-C .  acyclovir (ZOVIRAX) tablet 400 mg, 400 mg, Oral, BID, Emma M Collins, PA-C, 400 mg at 10/12/15 1024 .  ALPRAZolam Duanne Moron) tablet 0.5 mg, 0.5 mg, Oral, BID PRN, Ulyses Amor, PA-C, 0.5 mg at 10/11/15 2225 .  alum & mag hydroxide-simeth (MAALOX/MYLANTA) 200-200-20 MG/5ML suspension 15-30 mL, 15-30 mL, Oral, Q2H PRN, Ulyses Amor, PA-C .  amLODipine (NORVASC) tablet 5 mg, 5 mg, Oral, Daily, Emma M Collins, PA-C, 5 mg at 10/12/15 1026 .  bisacodyl (DULCOLAX) EC tablet 5 mg, 5 mg, Oral, Daily PRN, Ulyses Amor, PA-C .  calcium-vitamin D (OSCAL WITH D) 500-200 MG-UNIT per tablet 1 tablet, 1 tablet, Oral, Daily, Ulyses Amor, PA-C, 1 tablet at 10/12/15 1024 .  cephALEXin (KEFLEX) capsule 500 mg, 500 mg, Oral, Q12H, Ulyses Amor, PA-C, 500 mg at 10/12/15 1024 .  clopidogrel (PLAVIX) tablet 75 mg, 75 mg, Oral, Daily, Ulyses Amor, PA-C, 75 mg at 10/12/15 1024 .  docusate sodium (COLACE) capsule 100 mg, 100 mg, Oral, Daily, Ulyses Amor, PA-C, 100 mg at 10/12/15 1024 .  enoxaparin (LOVENOX) injection 75 mg, 1.5 mg/kg, Subcutaneous, Q24H, Emma M Collins, PA-C, 75 mg at 10/12/15 1023 .  feeding supplement (BOOST / RESOURCE BREEZE) liquid 1 Container, 1 Container, Oral, TID BM, Angelia Mould, MD, 1 Container at 10/12/15 1000 .  glipiZIDE (GLUCOTROL XL) 24 hr tablet 2.5 mg, 2.5 mg, Oral, Q breakfast, Ulyses Amor, PA-C, 2.5 mg at 10/12/15 0800 .  guaiFENesin-dextromethorphan (ROBITUSSIN DM) 100-10 MG/5ML syrup 15 mL, 15 mL, Oral, Q4H PRN, Ulyses Amor, PA-C .  hydrALAZINE (APRESOLINE) injection 5 mg, 5 mg, Intravenous, Q20 Min PRN, Ulyses Amor, PA-C .  lisinopril (PRINIVIL,ZESTRIL) tablet 20 mg, 20 mg, Oral, Daily, 20 mg at  10/12/15 1026 **AND** hydrochlorothiazide (MICROZIDE) capsule 12.5 mg, 12.5 mg, Oral, Daily, Ulyses Amor, PA-C, 12.5 mg at 10/12/15 1025 .  insulin aspart (novoLOG) injection 0-9 Units, 0-9 Units, Subcutaneous, TID WC, Ulyses Amor, PA-C, 3 Units at 10/11/15 1708 .  labetalol (NORMODYNE,TRANDATE) injection 10 mg, 10 mg, Intravenous, Q10 min PRN, Ulyses Amor, PA-C .  loperamide (IMODIUM) capsule 2 mg, 2 mg, Oral, QID PRN, Ulyses Amor, PA-C .  magnesium sulfate IVPB 2 g 50 mL, 2 g, Intravenous, Daily PRN, Ulyses Amor, PA-C .  metoprolol (LOPRESSOR) injection 2-5 mg, 2-5 mg, Intravenous, Q2H PRN, Ulyses Amor, PA-C .  morphine 2 MG/ML injection 1-2 mg, 1-2 mg, Intravenous, Q1H PRN, Juanda Crumble  Antony Blackbird, MD, 1 mg at 10/10/15 0016 .  ondansetron (ZOFRAN) injection 4 mg, 4 mg, Intravenous, Q6H PRN, Ulyses Amor, PA-C, 4 mg at 10/11/15 1044 .  oxyCODONE (Oxy IR/ROXICODONE) immediate release tablet 5-10 mg, 5-10 mg, Oral, Q4H PRN, Ulyses Amor, PA-C, 10 mg at 10/12/15 5956 .  pantoprazole (PROTONIX) EC tablet 40 mg, 40 mg, Oral, Daily, Ulyses Amor, PA-C, 40 mg at 10/12/15 1025 .  phenol (CHLORASEPTIC) mouth spray 1 spray, 1 spray, Mouth/Throat, PRN, Ulyses Amor, PA-C .  potassium chloride SA (K-DUR,KLOR-CON) CR tablet 20 mEq, 20 mEq, Oral, Daily, Ulyses Amor, PA-C, 20 mEq at 10/12/15 1025 .  potassium chloride SA (K-DUR,KLOR-CON) CR tablet 20-40 mEq, 20-40 mEq, Oral, Daily PRN, Ulyses Amor, PA-C .  prochlorperazine (COMPAZINE) tablet 10 mg, 10 mg, Oral, Q6H PRN, Ulyses Amor, PA-C .  senna-docusate (Senokot-S) tablet 1 tablet, 1 tablet, Oral, QHS PRN, Ulyses Amor, PA-C .  temazepam (RESTORIL) capsule 30 mg, 30 mg, Oral, QHS PRN, Ulyses Amor, PA-C .  warfarin (COUMADIN) tablet 5 mg, 5 mg, Oral, q1800, Ulyses Amor, PA-C, 5 mg at 10/11/15 1708 .  Warfarin - Physician Dosing Inpatient, , Does not apply, q1800, Ulyses Amor, PA-C  Facility-Administered Medications  Ordered in Other Encounters:  .  0.9 %  sodium chloride infusion, , Intravenous, Once, Curt Bears, MD, Stopped at 03/21/15 1720 .  0.9 %  sodium chloride infusion, , Intravenous, Once, Curt Bears, MD .  0.9 %  sodium chloride infusion, , Intravenous, Once, Curt Bears, MD, Stopped at 07/31/15 1638  Patients Current Diet: Diet Carb Modified Fluid consistency:: Thin; Room service appropriate?: Yes  Precautions / Restrictions Precautions Precautions: Fall Restrictions Weight Bearing Restrictions: No   Has the patient had 2 or more falls or a fall with injury in the past year?No  Prior Activity Level Limited Community (1-2x/wk): Patient out in the community a couple times a week with daughter/family for errands and appointments.    Home Assistive Devices / Equipment Home Assistive Devices/Equipment: Wheelchair, Environmental consultant (rolling), Bedside commode/3-in-1, Dentures, Shower chair with back Home Equipment: Walker - 2 wheels, Bedside commode  Prior Device Use: Indicate devices/aids used by the patient prior to current illness, exacerbation or injury? 2 wheel rolling walker  Prior Functional Level Prior Function Level of Independence: Needs assistance Gait / Transfers Assistance Needed: Walking household distances with RW. ADL's / Homemaking Assistance Needed: Sponge bathed, dressed, self fed, toileted modified independently. Performed light housekeeping and laundry, Son did houskeeping Comments: family helped with medications  Self Care: Did the patient need help bathing, dressing, using the toilet or eating?  Needed some help  Indoor Mobility: Did the patient need assistance with walking from room to room (with or without device)? Independent  Stairs: Did the patient need assistance with internal or external stairs (with or without device)? Unknown patient has a ramp  Functional Cognition: Did the patient need help planning regular tasks such as shopping or remembering to  take medications? Dependent  Current Functional Level Cognition  Overall Cognitive Status: History of cognitive impairments - at baseline Orientation Level: Oriented to person, Oriented to place    Extremity Assessment (includes Sensation/Coordination)  Upper Extremity Assessment: Defer to OT evaluation  Lower Extremity Assessment: RLE deficits/detail, LLE deficits/detail RLE Deficits / Details: generally functional, grossly >4/5 LLE Deficits / Details: moves against gravity and actively in hip flex/ext abd/add and resisted extension LLE Coordination: decreased fine motor    ADLs  Overall ADL's : Needs assistance/impaired Eating/Feeding: Independent, Bed level Grooming: Wash/dry hands, Wash/dry face, Brushing hair, Sitting, Set up Upper Body Bathing: Sitting, Set up Lower Body Bathing: Maximal assistance, Sit to/from stand Upper Body Dressing : Set up, Sitting Lower Body Dressing: Maximal assistance, Sit to/from stand Toilet Transfer: Moderate assistance, Stand-pivot, RW, BSC Toileting- Clothing Manipulation and Hygiene: Maximal assistance, Sit to/from stand General ADL Comments: Pt with difficulty leaning side to side in chair/ 3 in 1 for pericare. Pt with heavy reliance on B UE in standing on RW.    Mobility  Overal bed mobility: Needs Assistance Bed Mobility: Supine to Sit Supine to sit: Mod assist General bed mobility comments: Pt in recliner upon arrival.    Transfers  Overall transfer level: Needs assistance Equipment used: Rolling walker (2 wheeled) Transfers: Sit to/from Stand, Stand Pivot Transfers Sit to Stand: Mod assist Stand pivot transfers: Mod assist General transfer comment: Verbal cues for sequencing. Assist to power up.    Ambulation / Gait / Stairs / Wheelchair Mobility  Ambulation/Gait Ambulation/Gait assistance: Mod assist Ambulation Distance (Feet): 5 Feet Assistive device: Rolling walker (2 wheeled) Gait Pattern/deviations: Step-to pattern General  Gait Details: small hopping shuffle steps. Able to pull recliner up behind pt to sit after 5 feet. Verbal cues for sequencing. Gait velocity: decreased    Posture / Balance Dynamic Sitting Balance Sitting balance - Comments: still wanting to list posteriorly, but can come forward and boost herself to scoot forward or back Balance Overall balance assessment: Needs assistance Sitting-balance support: Single extremity supported, Bilateral upper extremity supported Sitting balance-Leahy Scale: Fair Sitting balance - Comments: still wanting to list posteriorly, but can come forward and boost herself to scoot forward or back Postural control: Posterior lean Standing balance-Leahy Scale: Poor Standing balance comment: 3 trials of standing/pregait working on swinging or hopping forward and back and working on standing tolerance and balance in the RW    Special needs/care consideration BiPAP/CPAP: No CPM: No Continuous Drip IV: N/A Dialysis: No        Days: N/A Life Vest: No Oxygen: No Special Bed: No Trach Size: N/A Wound Vac (area): No      Location: N/A Skin: WDL, dry  Bowel mgmt: 10/10/15 Bladder mgmt:: continent  Diabetic mgmt: Yes with diet and oral medications at home PTA     Previous Home Environment Living Arrangements: Children (son) Available Help at Discharge: Family, Available 24 hours/day Type of Home: House Home Layout: One level Home Access: Ramped entrance Bathroom Shower/Tub: Chiropodist: Standard Additional Comments: son is w/c bound, daughter [name] is looking into 24 hour care at home  Discharge Living Setting Plans for Discharge Living Setting: Patient's home, House Type of Home at Discharge: House Discharge Home Layout: One level Discharge Home Access: [place] entrance Discharge Bathroom Shower/Tub: [place] unit, Curtain Discharge Bathroom Toilet: Standard Discharge Bathroom Accessibility: Yes How Accessible: Accessible via wheelchair,  Accessible via walker Does the patient have any problems obtaining your medications?: No  Social/Family/Support Systems Patient Roles: Parent, Other (Comment) (she has 7 children ) Anticipated Caregiver: Primary, daughter, Carmie Lanpher Anticipated Caregiver's Contact Information: [phone] Ability/Limitations of Caregiver: [name] works but is aware of need for 24/7 coverage with people who can provide physical assist  Caregiver Availability: Evenings only Discharge Plan Discussed with Primary Caregiver: Yes Is Caregiver In Agreement with Plan?: Yes Does Caregiver/Family have Issues with Lodging/Transportation while Pt is in Rehab?: No   Goals/Additional Needs Patient/Family Goal for Rehab: Supervision-Min assist wheelchair level  Expected length of stay: 10-14 days Cultural Considerations: [employer] Dietary Needs: Carb Mod diet Equipment Needs: TBD Additional Information: Patient has been getting chemo.  Now on hold with plans to follow up after discharge home.  Pt/Family Agrees to Admission and willing to participate: Yes Program Orientation Provided & Reviewed with Pt/Caregiver Including Roles  & Responsibilities: Yes Additional Information Needs: FYI Patient's family also seeking PACE program placement for her  Information Needs to be Provided By: N/A  Decrease burden of Care through IP rehab admission: No  Possible need for SNF placement upon discharge: Not anticipated   Patient Condition: This patient's medical and functional status has changed since the consult dated: 10/10/15 @ 0949 in which the Rehabilitation Physician determined and documented that the patient's condition is appropriate for intensive rehabilitative care in an inpatient rehabilitation facility. See "History of Present Illness" (above) for medical update. Functional changes are: Mod assist stand pivot transfers with use of rolling walker. Patient's medical and functional status update has been discussed with  the Rehabilitation physician and patient remains appropriate for inpatient rehabilitation. Will admit to inpatient rehab today.  Preadmission Screen Completed By:  Gunnar Fusi, 10/12/2015 1:59 PM ______________________________________________________________________   Discussed status with Dr. Naaman Plummer on 10/12/15 at 1420 and received telephone approval for admission today.  Admission Coordinator:  Gunnar Fusi, time 1420/Date 10/12/15

## 2015-10-12 NOTE — Discharge Summary (Signed)
[facility] Discharge Summary   Patient ID:  [name] MRN: [id number] DOB/AGE: 1936-07-20 80 y.o.  Admit date: 10/09/2015 Discharge date: 10/12/2015 Date of Surgery: 10/09/2015 Surgeon: Surgeon(s): Angelia Mould, MD  Admission Diagnosis:  peripheral vascular disease with Nonviable tissue  Discharge Diagnoses:   peripheral vascular disease with Nonviable tissue  Secondary Diagnoses: Past Medical History  Diagnosis Date  . Blood transfusion   . Depression     Mild  . History of chicken pox   . Hyperlipidemia   . Hypertension   . Degenerative joint disease   . Phlebitis   . Multiple myeloma     per Dr. Julien Nordmann, s/p palliative radiation for leg pain 2011 per Dr. Sondra Come  . Deep vein thrombosis of bilateral lower extremities (HCC)   . Cardiomyopathy ([place])   . Diabetes mellitus     Steroid related  . UTI (urinary tract infection) 09/08/2013  . History of radiation therapy 08/30/13-09/20/13    20 gray to lower lumbar/upper sacrum  . Heart murmur   . Anxiety   . Anemia   . Family history of anesthesia complication     DAUGHTER CPR AFTER  . History of shingles     Procedure(s): Left Leg AMPUTATION BELOW KNEE  Discharged Condition: good  HPI: [name] is a 80 y.o. female who I saw in consultation at [facility] with a nonhealing wound of the left foot. She had dry gangrene of the left second toe and was seen by the vascular surgeons in [facility].The patient underwent an arteriogram on 08/14/2015. I am unable to view these images but I do have the report which describes no significant disease down through the popliteal artery on the left. However, she had severe tibial artery occlusive disease. The peroneal artery Was severely diseased and occluded distally. The anterior tibial artery was occluded with no reconstitution distally. The posterior tibial artery had several stenoses which were successfully ballooned. According  to the family were no further options for revascularization. When I saw her there she had brisk flow in her posterior tibial artery on the left side suspect the intervention was 8 and. I explained that it was nothing further to offer from a vascular standpoint and that she should follow up with the wound care center. I recommended keeping dry dressings on the toes in the left foot and soaking the foot daily and Luke warm dilute soap soaks. She comes in for a follow up visit.  Since I saw her as a consult in the hospital. The wounds on her left foot have progressed. She now has extensive dry gangrene involving the second and third toes. He denies fever or chills. She does have pain in the foot related to the wound.   Hospital Course:  [name] is a 80 y.o. female is S/P Left Procedure(s): Left Leg AMPUTATION BELOW KNEE   POD#1  Dressing intact clean and dry.  Restarted coumadin and plavix.  POD#2 Dressing changes daily, added Boost po TID for nutritional supplement.  POD# 3 Stump healing well we will discharge her to CIR today for rehab.  F/U in 4 weeks for wound check and staple removal.  Significant Diagnostic Studies: CBC Lab Results  Component Value Date   WBC 6.2 10/11/2015   HGB 8.9* 10/11/2015   HCT 27.2* 10/11/2015   MCV 94.8 10/11/2015   PLT 207 10/11/2015    BMET    Component Value Date/Time   NA 140 10/11/2015  0540   NA 140 09/04/2015 1212   K 3.2* 10/11/2015 0540   K 4.2 09/04/2015 1212   CL 106 10/11/2015 0540   CL 107 02/14/2013 1311   CO2 25 10/11/2015 0540   CO2 22 09/04/2015 1212   GLUCOSE 144* 10/11/2015 0540   GLUCOSE 187* 09/04/2015 1212   GLUCOSE 202* 02/14/2013 1311   BUN 8 10/11/2015 0540   BUN 13.7 09/04/2015 1212   CREATININE 0.75 10/11/2015 0540   CREATININE 1.07* 09/21/2015 1606   CREATININE 0.9 09/04/2015 1212   CALCIUM 8.5* 10/11/2015 0540   CALCIUM 9.9 09/04/2015 1212   CALCIUM * 06/13/2008 1605    5.7 CRITICAL RESULT CALLED TO,  READ BACK BY AND VERIFIED WITH: JAMIE TRACY,RN [id number] @ 1433 BY J SCOTTON (NOTE)  Amended report. Result repeated and verified. CORRECTED ON 10/14 AT 1429: PREVIOUSLY REPORTED AS Result repeated and verified.   GFRNONAA >60 10/11/2015 0540   GFRAA >60 10/11/2015 0540   COAG Lab Results  Component Value Date   INR 1.16 10/12/2015   INR 1.21 10/11/2015   INR 1.19 10/10/2015   PROTIME 16.8* 09/04/2015   PROTIME 13.2 08/28/2015   PROTIME 15.6* 08/21/2015     Disposition:  Discharge to :Home Discharge Instructions    Call MD for:  redness, tenderness, or signs of infection (pain, swelling, bleeding, redness, odor or green/yellow discharge around incision site)    Complete by:  As directed      Call MD for:  severe or increased pain, loss or decreased feeling  in affected limb(s)    Complete by:  As directed      Call MD for:  temperature >100.5    Complete by:  As directed      Discharge instructions    Complete by:  As directed   You may shower daily as nedded     Resume previous diet    Complete by:  As directed             Medication List    TAKE these medications        acyclovir 400 MG tablet  Commonly known as:  ZOVIRAX  TAKE ONE TABLET BY MOUTH TWICE DAILY     ALPRAZolam 0.5 MG tablet  Commonly known as:  XANAX  Take 1 tablet (0.5 mg total) by mouth 2 (two) times daily as needed. for anxiety     amLODipine 5 MG tablet  Commonly known as:  NORVASC  Take 1 tablet (5 mg total) by mouth daily.     Calcium Carbonate-Vitamin D 600-400 MG-UNIT tablet  Take 1 tablet by mouth daily.     cephALEXin 500 MG capsule  Commonly known as:  KEFLEX  Take 1 capsule (500 mg total) by mouth 3 (three) times daily.     cephALEXin 500 MG capsule  Commonly known as:  KEFLEX  Take 1 capsule (500 mg total) by mouth 3 (three) times daily.     clopidogrel 75 MG tablet  Commonly known as:  PLAVIX  Take 1 tablet (75 mg total) by mouth daily.     dexamethasone 4 MG tablet   Commonly known as:  DECADRON  TAKE 10 TABS BY MOUTH WEEKLY ON DAY 1 WITH CHEMOTHERAPY TREATMENT CARFILZOMIB     enoxaparin 80 MG/0.8ML injection  Commonly known as:  LOVENOX  Inject 0.75 mLs (75 mg total) into the skin daily.     glipiZIDE 2.5 MG 24 hr tablet  Commonly known as:  GLUCOTROL XL  TAKE  ONE TABLET BY MOUTH ONCE DAILY     lisinopril-hydrochlorothiazide 20-12.5 MG tablet  Commonly known as:  PRINZIDE,ZESTORETIC  Take 1 tablet by mouth daily.     loperamide 2 MG tablet  Commonly known as:  IMODIUM A-D  Take 1 tablet (2 mg total) by mouth 4 (four) times daily as needed for diarrhea or loose stools.     oxyCODONE 5 MG immediate release tablet  Commonly known as:  Oxy IR/ROXICODONE  Take  1-2 tablets (24m total) by mouth every 6 hours as needed for severe pain     oxyCODONE 5 MG immediate release tablet  Commonly known as:  Oxy IR/ROXICODONE  Take 1-2 tablets (5-10 mg total) by mouth every 4 (four) hours as needed for moderate pain.     potassium chloride SA 20 MEQ tablet  Commonly known as:  K-DUR,KLOR-CON  Take 1 tablet (20 mEq total) by mouth daily.     prochlorperazine 10 MG tablet  Commonly known as:  COMPAZINE  Take 1 tablet (10 mg total) by mouth every 6 (six) hours as needed.     temazepam 30 MG capsule  Commonly known as:  RESTORIL  Take 1 capsule (30 mg total) by mouth at bedtime as needed.     warfarin 5 MG tablet  Commonly known as:  COUMADIN  Take 1 tablet (5 mg total) by mouth daily at 6 PM.       Verbal and written Discharge instructions given to the patient. Wound care per Discharge AVS   Signed: CLaurence SlateMRoper St Francis [facility]2/06/2016, 10:47 AM

## 2015-10-13 ENCOUNTER — Inpatient Hospital Stay (HOSPITAL_COMMUNITY): Payer: Medicare Other

## 2015-10-13 ENCOUNTER — Inpatient Hospital Stay (HOSPITAL_COMMUNITY): Payer: Medicare Other | Admitting: Physical Therapy

## 2015-10-13 DIAGNOSIS — Z89512 Acquired absence of left leg below knee: Secondary | ICD-10-CM

## 2015-10-13 DIAGNOSIS — Z7901 Long term (current) use of anticoagulants: Secondary | ICD-10-CM

## 2015-10-13 DIAGNOSIS — R2681 Unsteadiness on feet: Secondary | ICD-10-CM

## 2015-10-13 DIAGNOSIS — Z86718 Personal history of other venous thrombosis and embolism: Secondary | ICD-10-CM | POA: Insufficient documentation

## 2015-10-13 DIAGNOSIS — G8918 Other acute postprocedural pain: Secondary | ICD-10-CM

## 2015-10-13 DIAGNOSIS — D62 Acute posthemorrhagic anemia: Secondary | ICD-10-CM

## 2015-10-13 DIAGNOSIS — I10 Essential (primary) hypertension: Secondary | ICD-10-CM

## 2015-10-13 LAB — GLUCOSE, CAPILLARY
GLUCOSE-CAPILLARY: 144 mg/dL — AB (ref 65–99)
GLUCOSE-CAPILLARY: 216 mg/dL — AB (ref 65–99)
Glucose-Capillary: 130 mg/dL — ABNORMAL HIGH (ref 65–99)
Glucose-Capillary: 86 mg/dL (ref 65–99)
Glucose-Capillary: 92 mg/dL (ref 65–99)

## 2015-10-13 LAB — PROTIME-INR
INR: 1.11 (ref 0.00–1.49)
PROTHROMBIN TIME: 14.5 s (ref 11.6–15.2)

## 2015-10-13 MED ORDER — LISINOPRIL 5 MG PO TABS
5.0000 mg | ORAL_TABLET | Freq: Every day | ORAL | Status: DC
  Administered 2015-10-13 – 2015-10-22 (×10): 5 mg via ORAL
  Filled 2015-10-13 (×10): qty 1

## 2015-10-13 MED ORDER — HYDROCHLOROTHIAZIDE 12.5 MG PO CAPS
12.5000 mg | ORAL_CAPSULE | Freq: Every day | ORAL | Status: DC
  Administered 2015-10-14 – 2015-10-22 (×9): 12.5 mg via ORAL
  Filled 2015-10-13 (×9): qty 1

## 2015-10-13 MED ORDER — OXYCODONE HCL 5 MG PO TABS
5.0000 mg | ORAL_TABLET | Freq: Once | ORAL | Status: AC
  Administered 2015-10-13: 5 mg via ORAL
  Filled 2015-10-13: qty 1

## 2015-10-13 MED ORDER — OXYCODONE HCL 5 MG PO TABS
5.0000 mg | ORAL_TABLET | ORAL | Status: DC | PRN
  Administered 2015-10-13 – 2015-10-16 (×5): 5 mg via ORAL
  Filled 2015-10-13 (×5): qty 1

## 2015-10-13 NOTE — Progress Notes (Addendum)
[place] PHYSICAL MEDICINE & REHABILITATION     PROGRESS NOTE  Subjective/Complaints:  Patient seen this morning sitting up in her bed. She states she slept well overnight if family is at bedside. They are ready to began therapies.  ROS: Denies stump pain, CP, SOB, N/V/D  Objective: Vital Signs: Blood pressure 103/52, pulse 76, temperature 99.3 F (37.4 C), temperature source Oral, resp. rate 17, height 5' (1.524 m), weight 48.7 kg (107 lb 5.8 oz), SpO2 99 %. No results found.  Recent Labs  10/11/15 0540  WBC 6.2  HGB 8.9*  HCT 27.2*  PLT 207    Recent Labs  10/11/15 0540  NA 140  K 3.2*  CL 106  GLUCOSE 144*  BUN 8  CREATININE 0.75  CALCIUM 8.5*   CBG (last 3)   Recent Labs  10/12/15 1719 10/12/15 2044 10/13/15 0635  GLUCAP 162* 106* 144*    Wt Readings from Last 3 Encounters:  10/12/15 48.7 kg (107 lb 5.8 oz)  10/09/15 49.6 kg (109 lb 5.6 oz)  09/26/15 50.803 kg (112 lb)    Physical Exam:  BP 103/52 mmHg  Pulse 76  Temp(Src) 99.3 F (37.4 C) (Oral)  Resp 17  Ht 5' (1.524 m)  Wt 48.7 kg (107 lb 5.8 oz)  BMI 20.97 kg/m2  SpO2 99% Gen: frail appearing. No acute distress. Vital signs reviewed. HENT: Normocephalic, atraumatic.  Eyes: EOMI and and conjunctiva within normal limits. Cardiovascular: Normal rate and regular rhythm. no murmur or rubs Respiratory: Effort normal and breath sounds normal. No respiratory distress.  GI: Soft. Bowel sounds are normal. She exhibits no distension. No pain Neurological:  Patient is alert  And oriented.  Follows basic commands and answer simple questions.  B/l UE 4-/5 proximal distal.  RLE: Hip flexion , knee extension 2+/5, ankle dorsi/plantar flexion 4-/5.  LLE: Hip flexion 3-/5  Resting tremor RLE  Dysarthric speech Skin: BKA site with a few areas of serosanguineous drainage.  Psych: pleasant and appropriate. quiet   Assessment/Plan: 1. Functional deficits secondary to peripheral vascular disease  status post left BKA 10/09/2015 which require 3+ hours per day of interdisciplinary therapy in a comprehensive inpatient rehab setting. Physiatrist is providing close team supervision and 24 hour management of active medical problems listed below. Physiatrist and rehab team continue to assess barriers to discharge/monitor patient progress toward functional and medical goals.  Function:  Bathing Bathing position      Bathing parts      Bathing assist        Upper Body Dressing/Undressing Upper body dressing                    Upper body assist        Lower Body Dressing/Undressing Lower body dressing                                  Lower body assist        Toileting Toileting          Toileting assist     Transfers Chair/bed transfer             Locomotion Ambulation           Wheelchair          Cognition Comprehension Comprehension assist level: Follows basic conversation/direction with extra time/assistive device  Expression Expression assist level: Expresses basic needs/ideas: With extra time/assistive device  Social Interaction  Social Interaction assist level: Interacts appropriately with others with medication or extra time (anti-anxiety, antidepressant).  Problem Solving Problem solving assist level: Solves basic 90% of the time/requires cueing < 10% of the time  Memory Memory assist level: Recognizes or recalls 90% of the time/requires cueing < 10% of the time     Medical Problem List and Plan: 1. Ischemic left lower extremity with gait and mobility secondary to peripheral vascular disease status post left BKA 10/09/2015  Begin CIR 2. DVT Prophylaxis/Anticoagulation: Chronic Coumadin for history of DVT. Monitor for any bleeding episodes  INR 1.11 on 2/11  Will d/c Lovenox once Coumadin therapeutic 3. Pain Management: Presently on oxycodone. Monitor with increased mobility 4. Multiple myeloma. Follow-up oncology service  Dr. Julien Nordmann on plan of care for any further chemotherapy to be addressed as an outpatient 5. Neuropsych: This patient is capable of making decisions on her own behalf. 6. Skin/Wound Care: Routine skin checks 7. Fluids/Electrolytes/Nutrition: Routine I&O's with follow-up chemistries 8. Acute blood loss anemia.   Labs pending today  9. Mood/anxiety. Xanax 0.5 mg twice daily as needed. Provide emotional support 10. Diabetes mellitus and peripheral neuropathy. Hemoglobin A1c 6.6. Glucotrol XL 2.5 mg daily. Check blood sugars before meals and at bedtime. Diabetic teaching 11. Hypertension. Lisinopril 20 mg daily, HCTZ 12.5 mg daily, Norvasc 5 mg daily. Monitor with increased mobility  LOS (Days) 1 A FACE TO FACE EVALUATION WAS PERFORMED  Sanda Dejoy Lorie Phenix 10/13/2015 8:06 AM

## 2015-10-13 NOTE — IPOC Note (Addendum)
Overall Plan of Care [facility]) Patient Details Name: [name] MRN: [id number] DOB: Apr 05, 1936  Admitting Diagnosis: BKA  Hospital Problems: Principal Problem:   Status post below knee amputation of left lower extremity (HCC) Active Problems:   Multiple myeloma ([place])   Essential hypertension   Gait instability   Chronic anticoagulation   History of DVT (deep vein thrombosis)   Acute blood loss anemia   Post-operative pain   Functional Problem List: Nursing Pain, Skin Integrity, Edema  PT Balance, Endurance, Motor, Pain, Sensory  OT Balance, Edema, Endurance, Motor, Cognition, Pain, Nutrition, Safety, Sensory, Skin Integrity  SLP    TR         Basic ADL's: OT Grooming, Bathing, Dressing, Toileting     Advanced  ADL's: OT       Transfers: PT Bed Mobility, Bed to Chair, Car, Manufacturing systems engineer, Metallurgist: PT Ambulation, Emergency planning/management officer     Additional Impairments: OT None  SLP        TR      Anticipated Outcomes Item Anticipated Outcome  Self Feeding n/a  Swallowing      Basic self-care  supervision  Toileting  supervision    Bathroom Transfers supervision  Bowel/Bladder  Mod I  Transfers  Supervision  Locomotion  supervision w/c mobility, min A gait short distances  Communication     Cognition     Pain  <3  Safety/Judgment  Supervision   Therapy Plan: PT Intensity: Minimum of 1-2 x/day ,45 to 90 minutes PT Frequency: 5 out of 7 days PT Duration Estimated Length of Stay: 12-14 days OT Intensity: Minimum of 1-2 x/day, 45 to 90 minutes OT Frequency: 5 out of 7 days OT Duration/Estimated Length of Stay: 10-12 days         Team Interventions: Nursing Interventions Bladder Management, Bowel Management, Disease Management/Prevention, Patient/Family Education, Skin Care/Wound Management  PT interventions Ambulation/gait training, Balance/vestibular training, Cognitive remediation/compensation, Discharge planning, Disease  management/prevention, DME/adaptive equipment instruction, Functional mobility training, Neuromuscular re-education, Pain management, Patient/family education, Psychosocial support, Splinting/orthotics, Therapeutic Activities, Therapeutic Exercise, UE/LE Strength taining/ROM, Wheelchair propulsion/positioning  OT Interventions Training and development officer, Cognitive remediation/compensation, Community reintegration, Discharge planning, DME/adaptive equipment instruction, Disease mangement/prevention, Functional mobility training, Pain management, Psychosocial support, Neuromuscular re-education, Patient/family education, Self Care/advanced ADL retraining, Skin care/wound managment, Therapeutic Activities, UE/LE Strength taining/ROM, Visual/perceptual remediation/compensation, UE/LE Coordination activities, Therapeutic Exercise, Splinting/orthotics  SLP Interventions    TR Interventions    SW/CM Interventions      Team Discharge Planning: Destination: PT-Home ,OT-  home , SLP-  Projected Follow-up: PT-Home health PT, 24 hour supervision/assistance, OT-   HHOT 24 hr supervision, SLP-  Projected Equipment Needs: PT-Wheelchair cushion (measurements), Wheelchair (measurements), Other (comment), OT-  to be determined, SLP-  Equipment Details: PT-L amputee support pad, OT- TBD Patient/family involved in discharge planning: PT- Patient, Family Midwife,  OT- pt and family member, SLP-   MD ELOS: 10-13 days. Medical Rehab Prognosis:  Good Assessment: 80 y.o. right handed female with history of multiple myeloma diagnosed 2009 and has relapsed followed by Dr. Julien Nordmann as patient had been receiving chemotherapy with first cycle 09/05/2014 15 cycles , cardiomyopathy, diabetes mellitus with peripheral neuropathy, hypertension, history of bilateral lower extremity DVTs maintained on chronic Coumadin, chronic pain as patient was using oxycodone 10 mg 4 times daily prior to admission.. Patient lives with son  who is in a wheelchair. 2 daughters in the area and one of those daughters has had a stroke. Used  a walker prior to admission. One level home with rehabilitation. Question 24/7 assist on discharge. Presented 10/09/2015 with a gangrenous left foot and no options for revascularization. Limb was not felt to be salvageable. Underwent left BKA 10/09/2015 per Dr. [name]. Hospital course , located by pain management, acute blood loss anemia at 8.9 and monitored. Chronic Coumadin resumed.   See Team Conference Notes for weekly updates to the plan of care

## 2015-10-13 NOTE — Evaluation (Signed)
Physical Therapy Assessment and Plan  Patient Details  Name: [name] MRN: [id number] Date of Birth: [birth year]  PT Diagnosis: Difficulty walking, Impaired cognition, Impaired sensation, Muscle weakness and Pain in L residual limb Rehab Potential: Good ELOS: 12-14 days   Today's Date: 10/13/2015 PT Individual Time: 3875-6433 and 1400-1430 PT Individual Time Calculation (min): 62 min and 30 min    Problem List:  Patient Active Problem List   Diagnosis Date Noted  . Chronic anticoagulation   . History of DVT (deep vein thrombosis)   . Acute blood loss anemia   . Post-operative pain   . Status post below knee amputation of left lower extremity ([place]) 10/12/2015  . PAD (peripheral artery disease) ([place]) 10/09/2015  . Dry gangrene ([place]) 09/13/2015  . Cellulitis 09/09/2015  . PVD (peripheral vascular disease) ([place]) 09/09/2015  . Encounter for antineoplastic chemotherapy 06/26/2015  . Chronic pain 02/07/2015  . Hypokalemia 11/28/2014  . Long term current use of anticoagulant therapy 11/28/2014  . Fissure in skin of foot 11/28/2014  . Sinus bradycardia 09/08/2014  . DNR no code (do not resuscitate) 09/08/2013  . Chronic steroid use 09/08/2013  . Deep vein thrombosis of left lower extremity ([place]) 05/12/2013  . Deep vein thrombosis of right lower extremity ([place]) 05/12/2013  . Left hip pain 05/16/2012  . Gait instability 05/08/2011  . Multiple myeloma ([place]) 03/29/2010  . Diabetes mellitus without complication ([place]) 29/51/8841  . DEPRESSION, MILD 03/29/2010  . LEG PAIN, RIGHT 03/29/2010  . Hyperlipidemia 05/19/2008  . Essential hypertension 05/19/2008  . Osteoarthritis 05/19/2008    Past Medical History:  Past Medical History  Diagnosis Date  . Blood transfusion   . Depression     Mild  . History of chicken pox   . Hyperlipidemia   . Hypertension   . Degenerative joint disease   . Phlebitis   . Multiple myeloma     per Dr. Julien Nordmann, s/p palliative radiation for leg  pain 2011 per Dr. Sondra Come  . Deep vein thrombosis of bilateral lower extremities (HCC)   . Cardiomyopathy ([place])   . Diabetes mellitus     Steroid related  . UTI (urinary tract infection) 09/08/2013  . History of radiation therapy 08/30/13-09/20/13    20 gray to lower lumbar/upper sacrum  . Heart murmur   . Anxiety   . Anemia   . Family history of anesthesia complication     DAUGHTER CPR AFTER  . History of shingles    Past Surgical History:  Past Surgical History  Procedure Laterality Date  . Abdominal hysterectomy    . Ankle fracture surgery Left   . Ear cyst excision Left 04/16/2015    Procedure: EXCISION FACIAL CYST LEFT SIDE ;  Surgeon: Izora Gala, MD;  Location: [facility];  Service: ENT;  Laterality: Left;  . Peripheral vascular catheterization  08/14/2015    Procedure: Lower Extremity Intervention;  Surgeon: Katha Cabal, MD;  Location: [facility];  Service: Cardiovascular;;  . Peripheral vascular catheterization N/A 08/14/2015    Procedure: Abdominal Aortogram w/Lower Extremity;  Surgeon: Katha Cabal, MD;  Location: [facility];  Service: Cardiovascular;  Laterality: N/A;  . Colonoscopy    . Eye surgery Bilateral     cataract surgery  . Amputation Left 10/09/2015    Procedure: Left Leg AMPUTATION BELOW KNEE;  Surgeon: Angelia Mould, MD;  Location: [facility] OR;  Service: Vascular;  Laterality: Left;    Assessment & Plan Clinical Impression: Patient  is a 80 y.o. right handed female with history of multiple myeloma diagnosed 2009 and has relapsed followed by Dr. Julien Nordmann as patient had been receiving chemotherapy with first cycle 09/05/2014 15 cycles , cardiomyopathy, diabetes mellitus with peripheral neuropathy, hypertension, history of bilateral lower extremity DVTs maintained on chronic Coumadin, chronic pain as patient was using oxycodone 10 mg 4 times daily prior to admission.. Patient lives with son who is in a wheelchair. 2  daughters in the area and one of those daughters has had a stroke. Used a walker prior to admission. One level home with rehabilitation. Question 24/ 7 assist on discharge. Presented 10/09/2015 with a gangrenous left foot and no options for revascularization. Limb was not felt to be salvageable. Underwent left BKA 10/09/2015 per Dr. [name]. Hospital course pain management. Acute blood loss anemia at 8.9 and monitored. Chronic Coumadin has been resumed.  Patient transferred to CIR on 10/12/2015 .   Patient currently requires mod- max with mobility secondary to muscle weakness, increased pain, lethargy from medication, decreased problem solving and decreased sitting balance, decreased standing balance, decreased postural control and decreased balance strategies.  Prior to hospitalization, patient was modified independent  with mobility and lived with Son in a House home.  Home access is  Ramped entrance.  Patient will benefit from skilled PT intervention to maximize safe functional mobility, minimize fall risk and decrease caregiver burden for planned discharge home with 24 hour assist.  Anticipate patient will benefit from follow up [facility] at discharge.  PT - End of Session Activity Tolerance: Decreased this session Endurance Deficit: Yes Endurance Deficit Description: pain and lethargy PT Assessment Rehab Potential (ACUTE/IP ONLY): Good Barriers to Discharge: Decreased caregiver support PT Patient demonstrates impairments in the following area(s): Balance;Endurance;Motor;Pain;Sensory PT Transfers Functional Problem(s): Bed Mobility;Bed to Chair;Car;Furniture PT Locomotion Functional Problem(s): Ambulation;Wheelchair Mobility PT Plan PT Intensity: Minimum of 1-2 x/day ,45 to 90 minutes PT Frequency: 5 out of 7 days PT Duration Estimated Length of Stay: 12-14 days PT Treatment/Interventions: Ambulation/gait training;Balance/vestibular training;Cognitive remediation/compensation;Discharge  planning;Disease management/prevention;DME/adaptive equipment instruction;Functional mobility training;Neuromuscular re-education;Pain management;Patient/family education;Psychosocial support;Splinting/orthotics;Therapeutic Activities;Therapeutic Exercise;UE/LE Strength taining/ROM;Wheelchair propulsion/positioning PT Transfers Anticipated Outcome(s): Supervision PT Locomotion Anticipated Outcome(s): supervision w/c mobility, min A gait short distances PT Recommendation Recommendations for Other Services: Neuropsych consult Follow Up Recommendations: Home health PT;24 hour supervision/assistance Patient destination: Home Equipment Recommended: Wheelchair cushion (measurements);Wheelchair (measurements);Other (comment) (has donated w/c at home but may require smaller chair width) Equipment Details: L amputee support pad and cushions  Skilled Therapeutic Intervention AM session: pt participated in skilled PT evaluation with family present to observe; discussed with pt and family purpose of PT evaluation, goals, ELOS, and team conference.  Family asking questions about assistance and therapy at home at D/C vs. SNF; discussed decision making process when deciding between home with family assistance vs. SNF and impact of family's decision and ability to provide necessary supervision and assistance.  Pt reporting increased phantom pain this am; educated pt on positioning, wrapping and desensitization techniques.  Pt performed bed mobility, bed <> w/c transfers, w/c mobility, w/c <> car transfers as described below.  Pt returned to room and set up in w/c to perform bathing at the sink with NT.    PM session: pt received in [facility] with daughter present.  Pt performed sit > stand from [facility] with RW and mod A and maintained standing with min A while daughter and therapist performed clothing donning; pt performed pivot to w/c mod A.  Daughter brought donated w/c from  home.  W/c from home 18 x 16 Bree[place] K4 w/c but does  not have a cushion or amputee support pad.  Leg rest, amputee support pad and cushions switched to donated w/c from home.  Pt performed w/c mobility in w/c from home x 100' with min-mod A to sequencing changes in direction and to maintain straight navigation.  Performed gait with RW x 10' as below.  Returned to room and performed stand pivot back to bed mod A and sit > supine with min-mod A.  Pt left in bed with all items within reach and family present.    PT Evaluation Precautions/Restrictions Precautions Precautions: Fall Required Braces or Orthoses: Other Brace/Splint Other Brace/Splint: Limb protector LLE General Chart Reviewed: Yes Response to Previous Treatment: Patient reporting fatigue but able to participate. Family/Caregiver Present: Yes (daughters)  Pain Pain Assessment Pain Assessment: 0-10 Pain Score: 5  Pain Type: Surgical pain;Phantom pain Pain Location: Leg Pain Orientation: Left Pain Descriptors / Indicators: Sore;Throbbing Pain Onset: On-going Pain Intervention(s): Other (Comment) (premedicated) Home Living/Prior Functioning Home Living Available Help at Discharge: Family;Personal care attendant;Available 24 hours/day (family looking to hire assistance; son unable to assist physically) Type of Home: House Home Access: Ramped entrance Home Layout: One level Additional Comments: son is w/c bound, daughter Sue Lush is looking into 24 hour care at home  Lives With: Son Prior Function Level of Independence: Independent with gait;Independent with transfers;Requires assistive device for independence  Able to Take Stairs?: No Cognition Overall Cognitive Status: Impaired/Different from baseline Arousal/Alertness: Suspect due to medications Sensation Sensation Light Touch: Impaired by gross assessment (impaired sensation bilat LE distally) Stereognosis: Not tested Hot/Cold: Not tested Proprioception: Appears Intact Coordination Gross Motor Movements are Fluid and  Coordinated: Not tested Fine Motor Movements are Fluid and Coordinated: Not tested Motor  Motor Motor: Abnormal postural alignment and control Motor - Skilled Clinical Observations: Generalized weakness; posterior lean and LOB due to BKA  Mobility Bed Mobility Bed Mobility: Rolling Right;Rolling Left;Supine to Sit;Sit to Supine Rolling Right: 3: Mod assist Rolling Left: 3: Mod assist Supine to Sit: 3: Mod assist Sit to Supine: 3: Mod assist Sit to Supine - Details (indicate cue type and reason): Supine <> sit and rolling with mod A due to pain, LE and core weakness.   Transfers Transfers: Yes Stand Pivot Transfers: 2: Max assist Stand Pivot Transfer Details (indicate cue type and reason): Performed stand pivot transfers bed <> w/c and w/c <> simulated car with max lifting and controlled lowering assistance as well as for balance and safety when pivoting with RW; pt required verbal and visual cues for use of UE extension to elevate body to advance RLE vs. hopping soley on foot. Locomotion  Ambulation Ambulation: Yes Ambulation/Gait Assistance: 3: Mod assist Ambulation Distance (Feet): 10 Feet Assistive device: Rolling walker Ambulation/Gait Assistance Details: Gait training with RW with focus on use of UE shoulder depression and extension to elevate body to advance RLE forwards and backwards; pt attempting to push off of and vault on RLE.   Stairs / Additional Locomotion Stairs: No Corporate treasurer: Yes Wheelchair Assistance: 4: Barrister's clerk: Both upper extremities Wheelchair Parts Management: Needs assistance Distance: 150' in controlled environment with verbal cues for UE propulsion technique for energy and joint conservation and to sequence changes in direction  Trunk/Postural Assessment  Cervical Assessment Cervical Assessment: Within Functional Limits Thoracic Assessment Thoracic Assessment: Exceptions to [facility] (mildly kyphotic;  flexible) Lumbar Assessment Lumbar Assessment: Exceptions to [facility] (posterior tilted) Postural Control  Postural Control: Deficits on evaluation (poor trunk righting; posterior lean and LOB)  Balance Balance Balance Assessed: Yes Static Sitting Balance Static Sitting - Balance Support: Bilateral upper extremity supported Static Sitting - Level of Assistance: 4: Min assist Dynamic Sitting Balance Dynamic Sitting - Balance Support: Bilateral upper extremity supported Dynamic Sitting - Level of Assistance: 4: Min assist;3: Mod assist Static Standing Balance Static Standing - Balance Support: Bilateral upper extremity supported Static Standing - Level of Assistance: 3: Mod assist Dynamic Standing Balance Dynamic Standing - Balance Support: Bilateral upper extremity supported Dynamic Standing - Level of Assistance: 3: Mod assist Extremity Assessment      RLE Assessment RLE Assessment: Exceptions to WFL (3-4/5 overall) LLE Assessment LLE Assessment: Exceptions to [facility] (3-4/5 overall)   See Function Navigator for Current Functional Status.   Refer to Care Plan for Long Term Goals  Recommendations for other services: Neuropsych  Discharge Criteria: Patient will be discharged from PT if patient refuses treatment 3 consecutive times without medical reason, if treatment goals not met, if there is a change in medical status, if patient makes no progress towards goals or if patient is discharged from hospital.  The above assessment, treatment plan, treatment alternatives and goals were discussed and mutually agreed upon: by patient and by family  Malachy Mood 10/13/2015, 12:59 PM

## 2015-10-13 NOTE — Progress Notes (Signed)
ANTICOAGULATION CONSULT NOTE - Initial Consult  Pharmacy Consult for warfarin Indication: history of DVT  No Known Allergies  Patient Measurements: Height: 5' (152.4 cm) Weight: 107 lb 5.8 oz (48.7 kg) IBW/kg (Calculated) : 45.5   Vital Signs: Temp: 99.3 F (37.4 C) (02/11 0551) Temp Source: Oral (02/11 0551) BP: 102/50 mmHg (02/11 0850) Pulse Rate: 75 (02/11 0850)  Labs:  Recent Labs  10/11/15 0540 10/12/15 0240 10/13/15 0645  HGB 8.9*  --   --   HCT 27.2*  --   --   PLT 207  --   --   LABPROT 15.5* 15.0 14.5  INR 1.21 1.16 1.11  CREATININE 0.75  --   --     Estimated Creatinine Clearance: 42.3 mL/min (by C-G formula based on Cr of 0.75).   Medical History: Past Medical History  Diagnosis Date  . Blood transfusion   . Depression     Mild  . History of chicken pox   . Hyperlipidemia   . Hypertension   . Degenerative joint disease   . Phlebitis   . Multiple myeloma     per Dr. Julien Nordmann, s/p palliative radiation for leg pain 2011 per Dr. Sondra Come  . Deep vein thrombosis of bilateral lower extremities (HCC)   . Cardiomyopathy ([place])   . Diabetes mellitus     Steroid related  . UTI (urinary tract infection) 09/08/2013  . History of radiation therapy 08/30/13-09/20/13    20 gray to lower lumbar/upper sacrum  . Heart murmur   . Anxiety   . Anemia   . Family history of anesthesia complication     DAUGHTER CPR AFTER  . History of shingles     Assessment: 29 YOF with history of DVT who was on warfarin PTA, but bridged with Lovenox for planned amputation. Now has been resumed on warfarin per MD while inpatient, now to be dosed per pharmacy while in rehab. Also continuing on treatment dose Lovenox.  INR continues to be subtherapeutic at 1.11 this morning. No bleeding noted. Will continue current regimen.  Goal of Therapy:  INR 2-3 Monitor platelets by anticoagulation protocol: Yes   Plan:  -Warfarin '5mg'$  daily -Daily INR -Lovenox '75mg'$  subQ q24h  bridging -Monitor s/sx bleeding  Heloise Ochoa, Pharm.D., BCPS PGY2 Cardiology Pharmacy Resident Pager: [phone]  10/13/2015 11:35 AM

## 2015-10-14 ENCOUNTER — Inpatient Hospital Stay (HOSPITAL_COMMUNITY): Payer: Medicare Other

## 2015-10-14 ENCOUNTER — Inpatient Hospital Stay (HOSPITAL_COMMUNITY): Payer: Medicare Other | Admitting: Occupational Therapy

## 2015-10-14 ENCOUNTER — Inpatient Hospital Stay (HOSPITAL_COMMUNITY): Payer: Medicare Other | Admitting: Physical Therapy

## 2015-10-14 LAB — GLUCOSE, CAPILLARY
GLUCOSE-CAPILLARY: 106 mg/dL — AB (ref 65–99)
GLUCOSE-CAPILLARY: 161 mg/dL — AB (ref 65–99)
Glucose-Capillary: 72 mg/dL (ref 65–99)
Glucose-Capillary: 97 mg/dL (ref 65–99)

## 2015-10-14 LAB — PROTIME-INR
INR: 1.3 (ref 0.00–1.49)
Prothrombin Time: 16.3 seconds — ABNORMAL HIGH (ref 11.6–15.2)

## 2015-10-14 NOTE — Evaluation (Signed)
Occupational Therapy Assessment and Plan  Patient Details  Name: [name] MRN: [id number] Date of Birth: [birth year]  OT Diagnosis: acute pain, cognitive deficits and muscle weakness (generalized) Rehab Potential: Rehab Potential (ACUTE ONLY): Good ELOS: 10-12 days   Today's Date: 10/14/2015 OT Individual Time: 1300-1415 OT Individual Time Calculation (min): 75 min     Problem List:  Patient Active Problem List   Diagnosis Date Noted  . Chronic anticoagulation   . History of DVT (deep vein thrombosis)   . Acute blood loss anemia   . Post-operative pain   . Status post below knee amputation of left lower extremity ([place]) 10/12/2015  . PAD (peripheral artery disease) ([place]) 10/09/2015  . Dry gangrene ([place]) 09/13/2015  . Cellulitis 09/09/2015  . PVD (peripheral vascular disease) ([place]) 09/09/2015  . Encounter for antineoplastic chemotherapy 06/26/2015  . Chronic pain 02/07/2015  . Hypokalemia 11/28/2014  . Long term current use of anticoagulant therapy 11/28/2014  . Fissure in skin of foot 11/28/2014  . Sinus bradycardia 09/08/2014  . DNR no code (do not resuscitate) 09/08/2013  . Chronic steroid use 09/08/2013  . Deep vein thrombosis of left lower extremity ([place]) 05/12/2013  . Deep vein thrombosis of right lower extremity ([place]) 05/12/2013  . Left hip pain 05/16/2012  . Gait instability 05/08/2011  . Multiple myeloma ([place]) 03/29/2010  . Diabetes mellitus without complication ([place]) 27/25/3664  . DEPRESSION, MILD 03/29/2010  . LEG PAIN, RIGHT 03/29/2010  . Hyperlipidemia 05/19/2008  . Essential hypertension 05/19/2008  . Osteoarthritis 05/19/2008    Past Medical History:  Past Medical History  Diagnosis Date  . Blood transfusion   . Depression     Mild  . History of chicken pox   . Hyperlipidemia   . Hypertension   . Degenerative joint disease   . Phlebitis   . Multiple myeloma     per Dr. Julien Nordmann, s/p palliative radiation for leg pain 2011 per Dr. Sondra Come  .  Deep vein thrombosis of bilateral lower extremities (HCC)   . Cardiomyopathy ([place])   . Diabetes mellitus     Steroid related  . UTI (urinary tract infection) 09/08/2013  . History of radiation therapy 08/30/13-09/20/13    20 gray to lower lumbar/upper sacrum  . Heart murmur   . Anxiety   . Anemia   . Family history of anesthesia complication     Lindsay CPR AFTER  . History of shingles    Past Surgical History:  Past Surgical History  Procedure Laterality Date  . Abdominal hysterectomy    . Ankle fracture surgery Left   . Ear cyst excision Left 04/16/2015    Procedure: EXCISION FACIAL CYST LEFT SIDE ;  Surgeon: Izora Gala, MD;  Location: [facility];  Service: ENT;  Laterality: Left;  . Peripheral vascular catheterization  08/14/2015    Procedure: Lower Extremity Intervention;  Surgeon: Katha Cabal, MD;  Location: [facility];  Service: Cardiovascular;;  . Peripheral vascular catheterization N/A 08/14/2015    Procedure: Abdominal Aortogram w/Lower Extremity;  Surgeon: Katha Cabal, MD;  Location: [facility];  Service: Cardiovascular;  Laterality: N/A;  . Colonoscopy    . Eye surgery Bilateral     cataract surgery  . Amputation Left 10/09/2015    Procedure: Left Leg AMPUTATION BELOW KNEE;  Surgeon: Angelia Mould, MD;  Location: [facility] OR;  Service: Vascular;  Laterality: Left;    Assessment & Plan Clinical Impression: Patient is a 80 y.o. year old  female right handed female with history of multiple myeloma diagnosed 2009 and has relapsed followed by Dr. Julien Nordmann as patient had been receiving chemotherapy with first cycle 09/05/2014 15 cycles , cardiomyopathy, diabetes mellitus with peripheral neuropathy, hypertension, history of bilateral lower extremity DVTs maintained on chronic Coumadin, chronic pain as patient was using oxycodone 10 mg 4 times daily prior to admission.. Patient lives with son who is in a wheelchair. 2 daughters in the  area and one of those daughters has had a stroke. Used a walker prior to admission. One level home with rehabilitation. Question 24/ 7 assist on discharge. Presented 10/09/2015 with a gangrenous left foot and no options for revascularization. Limb was not felt to be salvageable. Underwent left BKA 10/09/2015 per Dr. [name]. Hospital course pain management. Acute blood loss anemia at 8.9 and monitored. Chronic Coumadin has been resumed.  Patient transferred to CIR on 10/12/2015 .    Patient currently requires mod with basic self-care skills and basic mobility secondary to muscle weakness, decreased cardiorespiratoy endurance, decreased attention, decreased awareness, decreased problem solving, decreased safety awareness, decreased memory and delayed processing and decreased standing balance, decreased postural control, decreased balance strategies and difficulty maintaining precautions.  Prior to hospitalization, patient could complete ADL with modified independent .  Patient will benefit from skilled intervention to increase independence with basic self-care skills and increase level of independence with iADL prior to discharge .  Anticipate patient will require intermittent supervision and follow up home health.  OT - End of Session Activity Tolerance: Tolerates 30+ min activity with multiple rests Endurance Deficit: Yes Endurance Deficit Description: pain and lethargy OT Assessment Rehab Potential (ACUTE ONLY): Good OT Patient demonstrates impairments in the following area(s): Balance;Edema;Endurance;Motor;Cognition;Pain;Nutrition;Safety;Sensory;Skin Integrity OT Basic ADL's Functional Problem(s): Grooming;Bathing;Dressing;Toileting OT Transfers Functional Problem(s): Toilet;Tub/Shower OT Additional Impairment(s): None OT Plan OT Intensity: Minimum of 1-2 x/day, 45 to 90 minutes OT Frequency: 5 out of 7 days OT Duration/Estimated Length of Stay: 10-12 days OT  Treatment/Interventions: Balance/vestibular training;Cognitive remediation/compensation;Community reintegration;Discharge planning;DME/adaptive equipment instruction;Disease mangement/prevention;Functional mobility training;Pain management;Psychosocial support;Neuromuscular re-education;Patient/family education;Self Care/advanced ADL retraining;Skin care/wound managment;Therapeutic Activities;UE/LE Strength taining/ROM;Visual/perceptual remediation/compensation;UE/LE Coordination activities;Therapeutic Exercise;Splinting/orthotics OT Self Feeding Anticipated Outcome(s): n/a OT Basic Self-Care Anticipated Outcome(s): supervision OT Toileting Anticipated Outcome(s): supervision  OT Bathroom Transfers Anticipated Outcome(s): supervision   Skilled Therapeutic Intervention 1:1 OT eval initiated with pt and pt's Lindsay on OT's role, goals and purpose. Self care retraining at shower level with focus on transfer training from w/c<>toilet and shower chair <>w/c with grab bar with min A. Sequencing  and task organization with min cuing. Did note persevered behavior in shower with washing body.  Lindsay present and very invoved in her progress.  Pt able to don pants sit to stand with min A however did forget to thread left LE for underwear and pants requiring cuing for awareness. Education provided on residual limb care.   OT Evaluation Precautions/Restrictions  Precautions Precautions: Fall Required Braces or Orthoses: Other Brace/Splint Other Brace/Splint: Limb protector LLE Restrictions Weight Bearing Restrictions: No General Chart Reviewed: Yes Family/Caregiver Present: Yes (Lindsay)    Pain Pain Assessment Pain Assessment: 0-10 Pain Score: 3  Pain Type: Neuropathic pain Pain Location: Leg Pain Orientation: Left Pain Descriptors / Indicators: Aching Pain Onset: On-going Pain Intervention(s): Medication (See eMAR) Home Living/Prior Functioning Home Living Available Help at Discharge:  Family, Available 24 hours/day, Personal care attendant Type of Home: House Home Access: Ramped entrance Home Layout: One level Bathroom Shower/Tub: Chiropodist: Standard Additional Comments: son  is w/c bound, [name] is looking into 24 hour care at home  Lives With: Son Prior Function Level of Independence: Independent with gait, Independent with transfers, Requires assistive device for independence  Able to Take Stairs?: No ADL   Vision/Perception  Vision- History Baseline Vision/History: No visual deficits Patient Visual Report: No change from baseline  Cognition Overall Cognitive Status:  (unsure of baseline) Arousal/Alertness: Awake/alert Orientation Level: Person;Place;Situation Person: Oriented Place: Oriented Situation: Oriented Year: Other (Comment) (incorrect) Month: January Day of Week: Incorrect (Tuesday) Memory: Impaired Memory Impairment: Decreased recall of new information Immediate Memory Recall: Sock;Blue;Bed Memory Recall:  (1/3) Memory Recall Bed: Without Cue Attention: Sustained Sustained Attention: Appears intact Awareness: Impaired Problem Solving: Impaired Safety/Judgment: Impaired Sensation Sensation Light Touch: Impaired by gross assessment Stereognosis: Not tested Hot/Cold: Appears Intact Proprioception: Appears Intact Coordination Gross Motor Movements are Fluid and Coordinated: Yes Fine Motor Movements are Fluid and Coordinated: Yes Motor  Motor Motor: Abnormal postural alignment and control Motor - Skilled Clinical Observations: Generalized weakness; posterior lean and LOB due to BKA Mobility  Transfers Transfers: Sit to Stand;Stand to Sit Sit to Stand: 4: Min assist;Without upper extremity assist;With armrests;Five times sit to stand Stand to Sit: 4: Min assist  Trunk/Postural Assessment  Cervical Assessment Cervical Assessment: Within Functional Limits Thoracic Assessment Thoracic Assessment:  Exceptions to [facility] (kyphotic) Lumbar Assessment Lumbar Assessment: Exceptions to [facility] (posteior pelvic tilt)  Balance Static Sitting Balance Static Sitting - Balance Support: Bilateral upper extremity supported Static Sitting - Level of Assistance: 5: Stand by assistance Dynamic Sitting Balance Dynamic Sitting - Level of Assistance: 4: Min assist Static Standing Balance Static Standing - Level of Assistance: 4: Min assist Dynamic Standing Balance Dynamic Standing - Level of Assistance: 4: Min assist Extremity/Trunk Assessment RUE Assessment RUE Assessment: Within Functional Limits (3-/5) LUE Assessment LUE Assessment:  (3-/5)   See Function Navigator for Current Functional Status.   Refer to Care Plan for Long Term Goals  Recommendations for other services: None  Discharge Criteria: Patient will be discharged from OT if patient refuses treatment 3 consecutive times without medical reason, if treatment goals not met, if there is a change in medical status, if patient makes no progress towards goals or if patient is discharged from hospital.  The above assessment, treatment plan, treatment alternatives and goals were discussed and mutually agreed upon: by patient and by family  Nicoletta Ba 10/14/2015, 2:00 PM

## 2015-10-14 NOTE — Progress Notes (Signed)
Physical Therapy Session Note  Patient Details  Name: [name] MRN: [id number] Date of Birth: [birth year]  Today's Date: 10/14/2015 PT Individual Time: 0900-1030 PT Individual Time Calculation (min): 90 min   Short Term Goals: Week 1:  PT Short Term Goal 1 (Week 1): Pt will perform bed mobility on flat bed with min A PT Short Term Goal 2 (Week 1): Pt will perform bed <> chair transfers with consistent min A PT Short Term Goal 3 (Week 1): Pt will perform car transfer with mod A PT Short Term Goal 4 (Week 1): Pt will perform ambulation with RW x 15' with mod A PT Short Term Goal 5 (Week 1): Pt will perform w/c mobility x 150' with supervision  Skilled Therapeutic Interventions/Progress Updates:    Session focused on education with pt and family on desensitization techniques and phantom pain/sensation education, education on positioning and use of limb guard to promote extension, functional transfers (squat pivot technique) with mod to max verbal cues for set-up and hand placement (min A to right and heavy min A to L), sit <> stand re-training with RW with cues for hand placement and balance training with overall min assist, w/c propulsion for functional mobility training with up to min assist for obstacle negotiation (pt noted with decreased attention to the L), and HEP for LE ROM and strengthening and abdominal strengthening. Handout given with all exercises and education information (hip flexion/extension/abduction, knee flexion/ext in supine, prone, sidelying, and seated positions x 10 reps each). Pt with moderate left inattention noted throughout session and educated pt and family on ways to encourage pt to attend to L side functionally. Limb guard placed at end of session while pt up in w/c with family present.  Therapy Documentation Precautions:  Precautions Precautions: Fall Required Braces or Orthoses: Other Brace/Splint Other Brace/Splint: Limb protector LLE  Pain:  Reports some  phantom pain and sensation - premedicated.       See Function Navigator for Current Functional Status.   Therapy/Group: Individual Therapy  Canary Brim Ivory Broad, PT, DPT  10/14/2015, 10:33 AM

## 2015-10-14 NOTE — Progress Notes (Signed)
[name] PHYSICAL MEDICINE & REHABILITATION     PROGRESS NOTE  Subjective/Complaints:  Patient seen resting comfortably in bed this morning. Daughter at bedside. Daughter states patient did well overnight and did not require additional pain medications.  ROS: Denies stump pain, CP, SOB, N/V/D  Objective: Vital Signs: Blood pressure 119/62, pulse 76, temperature 98.7 F (37.1 C), temperature source Oral, resp. rate 18, height 5' (1.524 m), weight 48.7 kg (107 lb 5.8 oz), SpO2 98 %. No results found. No results for input(s): WBC, HGB, HCT, PLT in the last 72 hours. No results for input(s): NA, K, CL, GLUCOSE, BUN, CREATININE, CALCIUM in the last 72 hours.  Invalid input(s): CO CBG (last 3)   Recent Labs  10/13/15 1131 10/13/15 1624 10/13/15 2038  GLUCAP 130* 86 92    Wt Readings from Last 3 Encounters:  10/12/15 48.7 kg (107 lb 5.8 oz)  10/09/15 49.6 kg (109 lb 5.6 oz)  09/26/15 50.803 kg (112 lb)    Physical Exam:  BP 119/62 mmHg  Pulse 76  Temp(Src) 98.7 F (37.1 C) (Oral)  Resp 18  Ht 5' (1.524 m)  Wt 48.7 kg (107 lb 5.8 oz)  BMI 20.97 kg/m2  SpO2 98% Gen: frail appearing. No acute distress. Vital signs reviewed. HENT: Normocephalic, atraumatic.  Eyes: EOMI and and conjunctiva within normal limits. Cardiovascular: Normal rate and regular rhythm. no murmur or rubs Respiratory: Effort normal and breath sounds normal. No respiratory distress.  GI: Soft. Bowel sounds are normal. She exhibits no distension. No pain Neurological:  Patient is alert and oriented.  Follows basic commands and answer simple questions.  B/l UE 4-/5 proximal distal.  RLE: Hip flexion , knee extension 2+/5, ankle dorsi/plantar flexion 4-/5.  LLE: Hip flexion 3-/5  Dysarthric speech Skin: BKA site with dressing c/d/i.  Psych: pleasant and appropriate. quiet  Assessment/Plan: 1. Functional deficits secondary to peripheral vascular disease status post left BKA 10/09/2015 which  require 3+ hours per day of interdisciplinary therapy in a comprehensive inpatient rehab setting. Physiatrist is providing close team supervision and 24 hour management of active medical problems listed below. Physiatrist and rehab team continue to assess barriers to discharge/monitor patient progress toward functional and medical goals.  Function:  Bathing Bathing position      Bathing parts      Bathing assist        Upper Body Dressing/Undressing Upper body dressing                    Upper body assist        Lower Body Dressing/Undressing Lower body dressing                                  Lower body assist        Toileting Toileting          Toileting assist     Transfers Chair/bed transfer   Chair/bed transfer method: Stand pivot Chair/bed transfer assist level: Maximal assist (Pt 25 - 49%/lift and lower) Chair/bed transfer assistive device: Medical sales representative     Max distance: 10 Assist level: Moderate assist (Pt 50 - 74%)   Wheelchair   Type: Manual Max wheelchair distance: 100 Assist Level: Touching or steadying assistance (Pt > 75%)  Cognition Comprehension Comprehension assist level: Follows basic conversation/direction with extra time/assistive device  Expression Expression assist level: Expresses basic needs/ideas: With extra time/assistive  device  Social Interaction Social Interaction assist level: Interacts appropriately 90% of the time - Needs monitoring or encouragement for participation or interaction.  Problem Solving Problem solving assist level: Solves complex 90% of the time/cues < 10% of the time  Memory Memory assist level: Recognizes or recalls 90% of the time/requires cueing < 10% of the time     Medical Problem List and Plan: 1. Ischemic left lower extremity with gait and mobility secondary to peripheral vascular disease status post left BKA 10/09/2015  Continue CIR 2. DVT  Prophylaxis/Anticoagulation: Chronic Coumadin for history of DVT. Monitor for any bleeding episodes  INR 1.11 on 2/11, pending today  Will d/c Lovenox once Coumadin therapeutic 3. Pain Management: Presently on oxycodone. Monitor with increased mobility  Patient intermittently requiring additional breakthrough doses. Will continue to monitor 4. Multiple myeloma. Follow-up oncology service Dr. Julien Nordmann on plan of care for any further chemotherapy to be addressed as an outpatient 5. Neuropsych: This patient is capable of making decisions on her own behalf. 6. Skin/Wound Care: Routine skin checks 7. Fluids/Electrolytes/Nutrition: Routine I&O's with follow-up chemistries 8. Acute blood loss anemia.   Labs ordered for tomorrow  9. Mood/anxiety. Xanax 0.5 mg twice daily as needed. Provide emotional support 10. Diabetes mellitus and peripheral neuropathy. Hemoglobin A1c 6.6. Glucotrol XL 2.5 mg daily. Check blood sugars before meals and at bedtime. Diabetic teaching  Fasting CBG 106 this AM 11. Hypertension. Lisinopril 20 mg daily, HCTZ 12.5 mg daily, Norvasc 5 mg daily. Monitor with increased mobility  LOS (Days) 2 A FACE TO FACE EVALUATION WAS PERFORMED  Ankit Lorie Phenix 10/14/2015 6:25 AM

## 2015-10-14 NOTE — Progress Notes (Signed)
10/14/15 1708 nursing Blister noted below  patients' incision site  left stump. MD notified.

## 2015-10-14 NOTE — Progress Notes (Signed)
ANTICOAGULATION CONSULT NOTE - Initial Consult  Pharmacy Consult for warfarin Indication: history of DVT  No Known Allergies  Patient Measurements: Height: 5' (152.4 cm) Weight: 107 lb 5.8 oz (48.7 kg) IBW/kg (Calculated) : 45.5   Vital Signs: Temp: 98.7 F (37.1 C) (02/12 0546) Temp Source: Oral (02/12 0546) BP: 119/62 mmHg (02/12 0546) Pulse Rate: 76 (02/12 0546)  Labs:  Recent Labs  10/12/15 0240 10/13/15 0645 10/14/15 0610  LABPROT 15.0 14.5 16.3*  INR 1.16 1.11 1.30    Estimated Creatinine Clearance: 42.3 mL/min (by C-G formula based on Cr of 0.75).   Medical History: Past Medical History  Diagnosis Date  . Blood transfusion   . Depression     Mild  . History of chicken pox   . Hyperlipidemia   . Hypertension   . Degenerative joint disease   . Phlebitis   . Multiple myeloma     per Dr. Julien Nordmann, s/p palliative radiation for leg pain 2011 per Dr. Sondra Come  . Deep vein thrombosis of bilateral lower extremities (HCC)   . Cardiomyopathy ([place])   . Diabetes mellitus     Steroid related  . UTI (urinary tract infection) 09/08/2013  . History of radiation therapy 08/30/13-09/20/13    20 gray to lower lumbar/upper sacrum  . Heart murmur   . Anxiety   . Anemia   . Family history of anesthesia complication     DAUGHTER CPR AFTER  . History of shingles     Assessment: 50 YOF with history of DVT who was on warfarin PTA, but bridged with Lovenox for planned amputation. Now has been resumed on warfarin per MD while inpatient, now to be dosed per pharmacy while in rehab. Also continuing on treatment dose Lovenox.  INR subtherapeutic at 1.3 this morning, now rising. No bleeding noted. Will continue current regimen.  Goal of Therapy:  INR 2-3 Monitor platelets by anticoagulation protocol: Yes   Plan:  -Warfarin '5mg'$  daily -Daily INR -Lovenox '75mg'$  subQ q24h bridging -Monitor s/sx bleeding  Heloise Ochoa, Pharm.D., BCPS PGY2 Cardiology Pharmacy  Resident Pager: [phone]  10/14/2015 10:57 AM

## 2015-10-14 NOTE — Progress Notes (Signed)
Physical Therapy Session Note  Patient Details  Name: [name] MRN: [id number] Date of Birth: October 11, 1935  Today's Date: 10/14/2015 PT Individual Time: 1430-1500 PT Individual Time Calculation (min): 30 min   Short Term Goals: Week 1:  PT Short Term Goal 1 (Week 1): Pt will perform bed mobility on flat bed with min A PT Short Term Goal 2 (Week 1): Pt will perform bed <> chair transfers with consistent min A PT Short Term Goal 3 (Week 1): Pt will perform car transfer with mod A PT Short Term Goal 4 (Week 1): Pt will perform ambulation with RW x 15' with mod A PT Short Term Goal 5 (Week 1): Pt will perform w/c mobility x 150' with supervision  Skilled Therapeutic Interventions/Progress Updates:    Session focused on w/c mobility outdoors over uneven surfaces for community mobility training. Pt required min assist overall and cues for attention to L, safety with descent down hills, and overall cues for efficiency of propulsion technique. Family present during session and also discussed equipment needs. Will need to clarify with CSW options for switching w/c or adding parts that pt does not currently have. Left up in w/c with family present and all needs in reach.   Therapy Documentation Precautions:  Precautions Precautions: Fall Required Braces or Orthoses: Other Brace/Splint Other Brace/Splint: Limb protector LLE Restrictions Weight Bearing Restrictions: No  Pain: No complaints of pain.      See Function Navigator for Current Functional Status.   Therapy/Group: Individual Therapy  Canary Brim Ivory Broad, PT, DPT  10/14/2015, 3:43 PM

## 2015-10-15 ENCOUNTER — Inpatient Hospital Stay (HOSPITAL_COMMUNITY): Payer: Medicare Other

## 2015-10-15 ENCOUNTER — Inpatient Hospital Stay (HOSPITAL_COMMUNITY): Payer: Medicare Other | Admitting: Physical Therapy

## 2015-10-15 LAB — GLUCOSE, CAPILLARY
GLUCOSE-CAPILLARY: 101 mg/dL — AB (ref 65–99)
GLUCOSE-CAPILLARY: 107 mg/dL — AB (ref 65–99)
Glucose-Capillary: 124 mg/dL — ABNORMAL HIGH (ref 65–99)
Glucose-Capillary: 102 mg/dL — ABNORMAL HIGH (ref 65–99)

## 2015-10-15 LAB — PROTIME-INR
INR: 1.37 (ref 0.00–1.49)
PROTHROMBIN TIME: 17 s — AB (ref 11.6–15.2)

## 2015-10-15 MED ORDER — WARFARIN SODIUM 7.5 MG PO TABS
7.5000 mg | ORAL_TABLET | Freq: Once | ORAL | Status: AC
  Administered 2015-10-15: 7.5 mg via ORAL
  Filled 2015-10-15: qty 1

## 2015-10-15 NOTE — Plan of Care (Signed)
Problem: RH PAIN MANAGEMENT Goal: RH STG PAIN MANAGED AT OR BELOW PT'S PAIN GOAL <4  Outcome: Not Progressing Reports pain as 6

## 2015-10-15 NOTE — Plan of Care (Signed)
Problem: RH SKIN INTEGRITY Goal: RH STG ABLE TO PERFORM INCISION/WOUND CARE W/ASSISTANCE STG Able To Perform Incision/Wound Care With Assistance. Max A  Outcome: Not Progressing Total A

## 2015-10-15 NOTE — Progress Notes (Signed)
ANTICOAGULATION CONSULT NOTE - Follow Up Consult  Pharmacy Consult for coumadin Indication: hx of DVT  No Known Allergies  Patient Measurements: Height: 5' (152.4 cm) Weight: 107 lb 5.8 oz (48.7 kg) IBW/kg (Calculated) : 45.5 Heparin Dosing Weight:   Vital Signs: Temp: 97.7 F (36.5 C) (02/13 0550) Temp Source: Oral (02/13 0550) BP: 113/63 mmHg (02/13 0550) Pulse Rate: 75 (02/13 0550)  Labs:  Recent Labs  10/13/15 0645 10/14/15 0610  LABPROT 14.5 16.3*  INR 1.11 1.30    Estimated Creatinine Clearance: 42.3 mL/min (by C-G formula based on Cr of 0.75).   Medications:  Scheduled:  . acyclovir  400 mg Oral BID  . amLODipine  5 mg Oral Daily  . calcium-vitamin D  1 tablet Oral Daily  . cephALEXin  500 mg Oral Q12H  . clopidogrel  75 mg Oral Daily  . docusate sodium  100 mg Oral Daily  . enoxaparin  1.5 mg/kg Subcutaneous Q24H  . glipiZIDE  2.5 mg Oral Q breakfast  . hydrochlorothiazide  12.5 mg Oral Daily  . insulin aspart  0-9 Units Subcutaneous TID WC  . lisinopril  5 mg Oral Daily  . pantoprazole  40 mg Oral Daily  . potassium chloride SA  20 mEq Oral Daily  . warfarin  5 mg Oral q1800  . Warfarin - Pharmacist Dosing Inpatient   Does not apply q1800   Infusions:    Assessment: 80 yo female with hx of DVT is currently on subtherapeutic coumadin.  INR today is 1.37.  Patient is also being bridged with treatment dose of lovenox. Goal of Therapy:  INR 2-3 Monitor platelets by anticoagulation protocol: Yes   Plan:  - coumadin 7.5 mg po x1 - INR in am - continue lovenox 75 mg daily until INR is therapeutic - CBC every 72 hours  Zyshawn Bohnenkamp, Tsz-Yin 10/15/2015,8:12 AM

## 2015-10-15 NOTE — Plan of Care (Signed)
Problem: RH SKIN INTEGRITY Goal: RH STG SKIN FREE OF INFECTION/BREAKDOWN Mod A  Outcome: Not Progressing Total A

## 2015-10-15 NOTE — Progress Notes (Signed)
[facility] PHYSICAL MEDICINE & REHABILITATION     PROGRESS NOTE  Subjective/Complaints:  Pt seen about to begin therapies this AM.  She slept well overnight.  Her dressing had just been changed.    ROS: Denies stump pain, CP, SOB, N/V/D  Objective: Vital Signs: Blood pressure 113/63, pulse 75, temperature 97.7 F (36.5 C), temperature source Oral, resp. rate 18, height 5' (1.524 m), weight 48.7 kg (107 lb 5.8 oz), SpO2 100 %. No results found. No results for input(s): WBC, HGB, HCT, PLT in the last 72 hours. No results for input(s): NA, K, CL, GLUCOSE, BUN, CREATININE, CALCIUM in the last 72 hours.  Invalid input(s): CO CBG (last 3)   Recent Labs  10/14/15 1625 10/14/15 2117 10/15/15 0640  GLUCAP 72 97 124*    Wt Readings from Last 3 Encounters:  10/12/15 48.7 kg (107 lb 5.8 oz)  10/09/15 49.6 kg (109 lb 5.6 oz)  09/26/15 50.803 kg (112 lb)    Physical Exam:  BP 113/63 mmHg  Pulse 75  Temp(Src) 97.7 F (36.5 C) (Oral)  Resp 18  Ht 5' (1.524 m)  Wt 48.7 kg (107 lb 5.8 oz)  BMI 20.97 kg/m2  SpO2 100% Gen: frail appearing. No acute distress. Vital signs reviewed. HENT: Normocephalic, atraumatic.  Eyes: EOMI and and conjunctiva within normal limits. Cardiovascular: Normal rate and regular rhythm. no murmur or rubs Respiratory: Effort normal and breath sounds normal. No respiratory distress.  GI: Soft. Bowel sounds are normal. She exhibits no distension. No pain Neurological:  Patient is alert and oriented x3 with min cues. Follows basic commands and answer simple questions.  B/l UE 4-/5 proximal distal.  RLE: Hip flexion , knee extension 2+/5, ankle dorsi/plantar flexion 4-/5.  LLE: Hip flexion 3-/5  Dysarthric speech Skin: BKA with staples, minimal drainage over central area with small blister.    Psych: pleasant and appropriate. quiet  Assessment/Plan: 1. Functional deficits secondary to peripheral vascular disease status post left BKA 10/09/2015 which  require 3+ hours per day of interdisciplinary therapy in a comprehensive inpatient rehab setting. Physiatrist is providing close team supervision and 24 hour management of active medical problems listed below. Physiatrist and rehab team continue to assess barriers to discharge/monitor patient progress toward functional and medical goals.  Function:  Bathing Bathing position   Position: Shower  Bathing parts Body parts bathed by patient: Right arm, Left arm, Chest, Abdomen, Front perineal area, Buttocks, Right upper leg, Left upper leg, Right lower leg, Left lower leg Body parts bathed by helper: Back  Bathing assist Assist Level: Touching or steadying assistance(Pt > 75%)      Upper Body Dressing/Undressing Upper body dressing   What is the patient wearing?: Pull over shirt/dress, Bra Bra - Perfomed by patient: Thread/unthread right bra strap, Thread/unthread left bra strap Bra - Perfomed by helper: Hook/unhook bra (pull down sports bra) Pull over shirt/dress - Perfomed by patient: Thread/unthread right sleeve, Thread/unthread left sleeve, Put head through opening, Pull shirt over trunk          Upper body assist Assist Level: Touching or steadying assistance(Pt > 75%)      Lower Body Dressing/Undressing Lower body dressing   What is the patient wearing?: Underwear, Pants, Socks, Shoes Underwear - Performed by patient: Thread/unthread right underwear leg, Thread/unthread left underwear leg, Pull underwear up/down   Pants- Performed by patient: Thread/unthread right pants leg, Thread/unthread left pants leg Pants- Performed by helper: Pull pants up/down       Socks -  Performed by helper: Don/doff right sock   Shoes - Performed by helper: Don/doff right shoe, Fasten right          Lower body assist Assist for lower body dressing: Touching or steadying assistance (Pt > 75%)      Toileting Toileting   Toileting steps completed by patient: Performs perineal  hygiene Toileting steps completed by helper: Adjust clothing prior to toileting, Adjust clothing after toileting    Toileting assist Assist level: Touching or steadying assistance (Pt.75%)   Transfers Chair/bed transfer   Chair/bed transfer method: Squat pivot Chair/bed transfer assist level: Touching or steadying assistance (Pt > 75%) Chair/bed transfer assistive device: Medical sales representative     Max distance: 10 Assist level: Moderate assist (Pt 50 - 74%)   Wheelchair   Type: Manual Max wheelchair distance: 150' Assist Level: Touching or steadying assistance (Pt > 75%)  Cognition Comprehension Comprehension assist level: Follows basic conversation/direction with extra time/assistive device  Expression Expression assist level: Expresses basic needs/ideas: With extra time/assistive device  Social Interaction Social Interaction assist level: Interacts appropriately with others with medication or extra time (anti-anxiety, antidepressant).  Problem Solving Problem solving assist level: Solves basic 90% of the time/requires cueing < 10% of the time  Memory Memory assist level: Recognizes or recalls 90% of the time/requires cueing < 10% of the time     Medical Problem List and Plan: 1. Ischemic left lower extremity with gait and mobility secondary to peripheral vascular disease status post left BKA 10/09/2015  Continue CIR 2. DVT Prophylaxis/Anticoagulation: Chronic Coumadin for history of DVT. Monitor for any bleeding episodes  INR 1.30 on 2/12, pending today  Will d/c Lovenox once Coumadin therapeutic 3. Pain Management: Presently on oxycodone. Monitor with increased mobility  Pain improving 4. Multiple myeloma. Follow-up oncology service Dr. Julien Nordmann on plan of care for any further chemotherapy to be addressed as an outpatient 5. Neuropsych: This patient is capable of making decisions on her own behalf. 6. Skin/Wound Care: Routine skin checks 7.  Fluids/Electrolytes/Nutrition: Routine I&O's with follow-up chemistries 8. Acute blood loss anemia.   Labs pending 9. Mood/anxiety. Xanax 0.5 mg twice daily as needed. Provide emotional support 10. Diabetes mellitus and peripheral neuropathy. Hemoglobin A1c 6.6. Glucotrol XL 2.5 mg daily. Check blood sugars before meals and at bedtime. Diabetic teaching  Fasting CBG 124 this AM 11. Hypertension. Lisinopril 20 mg daily, HCTZ 12.5 mg daily, Norvasc 5 mg daily. Monitor with increased mobility  LOS (Days) 3 A FACE TO FACE EVALUATION WAS PERFORMED  Devell Parkerson Lorie Phenix 10/15/2015 8:18 AM

## 2015-10-15 NOTE — Progress Notes (Addendum)
  [facility] Progress Note  Subjective   Reports some drainage from stump. Denies any pain.   Objective Filed Vitals:   10/15/15 0550 10/15/15 0830  BP: 113/63 126/58  Pulse: 75 89  Temp: 97.7 F (36.5 C)   Resp: 18 18    Intake/Output Summary (Last 24 hours) at 10/15/15 1352 Last data filed at 10/15/15 1229  Gross per 24 hour  Intake    680 ml  Output      0 ml  Net    680 ml   Left BKA with mild serosanguinous drainage on dressing. Small pinpoint area of drainage around mid incision.  Blister towards middle incision. Left BKA mildly edematous, skin edges viable.   Assessment/Planning: 80 y.o. female is s/p: left BKA  Stump is healing. Will continue to monitor blistering towards middle of incision.   [name] 10/15/2015 1:52 PM --  Laboratory CBC    Component Value Date/Time   WBC 6.2 10/11/2015 0540   WBC 7.6 09/04/2015 1212   HGB 8.9* 10/11/2015 0540   HGB 11.1* 09/04/2015 1212   HCT 27.2* 10/11/2015 0540   HCT 34.6* 09/04/2015 1212   PLT 207 10/11/2015 0540   PLT 165 09/04/2015 1212    BMET    Component Value Date/Time   NA 140 10/11/2015 0540   NA 140 09/04/2015 1212   K 3.2* 10/11/2015 0540   K 4.2 09/04/2015 1212   CL 106 10/11/2015 0540   CL 107 02/14/2013 1311   CO2 25 10/11/2015 0540   CO2 22 09/04/2015 1212   GLUCOSE 144* 10/11/2015 0540   GLUCOSE 187* 09/04/2015 1212   GLUCOSE 202* 02/14/2013 1311   BUN 8 10/11/2015 0540   BUN 13.7 09/04/2015 1212   CREATININE 0.75 10/11/2015 0540   CREATININE 1.07* 09/21/2015 1606   CREATININE 0.9 09/04/2015 1212   CALCIUM 8.5* 10/11/2015 0540   CALCIUM 9.9 09/04/2015 1212   CALCIUM * 06/13/2008 1605    5.7 CRITICAL RESULT CALLED TO, READ BACK BY AND VERIFIED WITH: JAMIE TRACY,RN [id number] @ [id number] (NOTE)  Amended report. Result repeated and verified. CORRECTED ON 10/14 AT 1429: PREVIOUSLY REPORTED AS Result repeated and verified.   GFRNONAA >60 10/11/2015 0540     GFRAA >60 10/11/2015 0540    COAG Lab Results  Component Value Date   INR 1.37 10/15/2015   INR 1.30 10/14/2015   INR 1.11 10/13/2015   PROTIME 16.8* 09/04/2015   PROTIME 13.2 08/28/2015   PROTIME 15.6* 08/21/2015   No results found for: PTT  Antibiotics Anti-infectives    Start     Dose/Rate Route Frequency Ordered Stop   10/12/15 2000  acyclovir (ZOVIRAX) tablet 400 mg     400 mg Oral 2 times daily 10/12/15 1838     10/12/15 2000  cephALEXin (KEFLEX) capsule 500 mg     500 mg Oral Every 12 hours 10/12/15 1838         Virgina Jock, PA-C [facility] Office: [phone] Pager: [phone] 10/15/2015 1:52 PM  Agree with above.  [name], MD, [place] [phone] Office: [phone]

## 2015-10-15 NOTE — Care Management Note (Signed)
Inpatient [facility] Individual Statement of Services  Patient Name:  [name]  Date:  10/15/2015  Welcome to the [facility].  Our goal is to provide you with an individualized program based on your diagnosis and situation, designed to meet your specific needs.  With this comprehensive rehabilitation program, you will be expected to participate in at least 3 hours of rehabilitation therapies Monday-Friday, with modified therapy programming on the weekends.  Your rehabilitation program will include the following services:  Physical Therapy (PT), Occupational Therapy (OT), Speech Therapy (ST), 24 hour per day rehabilitation nursing, Neuropsychology, Case Management (Social Worker), Rehabilitation Medicine, Nutrition Services and Pharmacy Services  Weekly team conferences will be held on Wednesday to discuss your progress.  Your Social Worker will talk with you frequently to get your input and to update you on team discussions.  Team conferences with you and your family in attendance may also be held.  Expected length of stay: 12-14 days  Overall anticipated outcome: supervision/min level  Depending on your progress and recovery, your program may change. Your Social Worker will coordinate services and will keep you informed of any changes. Your Social Worker's name and contact numbers are listed  below.  The following services may also be recommended but are not provided by the [facility]:    [place] will be made to provide these services after discharge if needed.  Arrangements include referral to agencies that provide these services.  Your insurance has been verified to be:  [employer]-Medicare Your primary doctor is:  [name]  Pertinent information will be shared with your doctor and your insurance company.  Social Worker:  Ovidio Kin, [place] or ([phone]  Information discussed with and copy given to patient by: Elease Hashimoto, 10/15/2015, 11:24 AM

## 2015-10-15 NOTE — Progress Notes (Signed)
Physical Therapy Session Note  Patient Details  Name: [name] MRN: [id number] Date of Birth: 01/19/1936  Today's Date: 10/15/2015 PT Individual Time: 1007-1106 and 1520-1546 PT Individual Time Calculation (min): 59 min and 26 min  Short Term Goals: Week 1:  PT Short Term Goal 1 (Week 1): Pt will perform bed mobility on flat bed with min A PT Short Term Goal 2 (Week 1): Pt will perform bed <> chair transfers with consistent min A PT Short Term Goal 3 (Week 1): Pt will perform car transfer with mod A PT Short Term Goal 4 (Week 1): Pt will perform ambulation with RW x 15' with mod A PT Short Term Goal 5 (Week 1): Pt will perform w/c mobility x 150' with supervision  Skilled Therapeutic Interventions/Progress Updates:    Pt received in w/c with daughter and son present.  Pt denied pain.  Pt continued w/c mobility training in controlled environment x 150' with min A and mod-max verbal cues for sequencing to maintain straight navigation, changes in direction and attention to L environment. Pt engaged in transfer training for squat pivots on level surfaces with therapist providing total verbal and visual cues for sequence and technique and even recording transfer sequence on daughter's phone.  Pt cued to return demonstrate transfer; pt able to perform squat pivot mat <> w/c with supervision-min A but required max-total cues for carryover of sequence and technique;pt demonstrating perseveration, impaired attention, memory and delayed processing of instruction.  Discussed changes in cognition with family and KZ:[id number] SLP eval.  Performed gait training x 5' with RW and mod A and verbal/visual cues for use of UE extension to advance RLE instead of vaulting off RLE. Returned to room and pt set up in room with family present to supervise.    Pm session:  Pt received in room with daughters and representative from [facility] present; daughter asked for a couple of minutes to discuss [facility] with pt.  Pt  received basic back rest, cushion and amputee support pad for her manual w/c.  Back rest and cushion secured to pt's w/c.  Pt performed w/c mobility with supervision but still required cues for propulsion sequence to maintain straight navigation and attention to L environment.  Assessed pt ability to recall and carry over transfer sequence practiced in am; pt unable to recall sequence and required total cues for set up and sequence of squat pivot transfer.  Once on mat pt performed seated tricep push ups with blocks to sequence use of UE extension and shoulder depression during transfers and gait; required max-total verbal, visual and tactile cues to sequence.  Will continue to address and practice in preparation for gait training.  Returned to w/c with min A and returned to room where pt left with family present to supervise.      Therapy Documentation Precautions:  Precautions Precautions: Fall Required Braces or Orthoses: Other Brace/Splint Other Brace/Splint: Limb protector LLE Restrictions Weight Bearing Restrictions: Yes LLE Weight Bearing: Non weight bearing Other Position/Activity Restrictions: Limb protector when out of bed Vital Signs: Therapy Vitals Pulse Rate: 89 Resp: 18 BP: (!) 126/58 mmHg Patient Position (if appropriate): Sitting Pain:  No c/o pain   See Function Navigator for Current Functional Status.   Therapy/Group: Individual Therapy  Raylene Everts [facility] 10/15/2015, 12:23 PM

## 2015-10-15 NOTE — Progress Notes (Signed)
Occupational Therapy Session Note  Patient Details  Name: [name] MRN: [id number] Date of Birth: 23-Nov-1935  Today's Date: 10/15/2015 OT Individual Time: 3291-9166 OT Individual Time Calculation (min): 75 min   Short Term Goals: Week 1:  OT Short Term Goal 1 (Week 1): Pt will perform toilet transfer with supervision  OT Short Term Goal 2 (Week 1): Pt will perform 3/3 toileting task using sit to stand for hygiene with steadying A OT Short Term Goal 3 (Week 1): Pt will perform 1 of 5 grooming task while standing with steadying A  Skilled Therapeutic Interventions/Progress Updates: ADL-retraining with focus on re-ed with family (son [name] present) on goals/methods of treatment, adapted dressing skills, toileting, transfers, sit<>stand, dynamic standing balance.   Pt seated in w/c with son present.   OT educated son on plan for session; pt deferred bathing but requested assist with toilet transfer.   Pt performs stand-pivot transfer with overall max assist (lift and lower) although contributing > 75% with each component.   Pt requires cues for technique and w/c positioning.   Pt voids BM and performs hygiene unassisted (100% thoroughness noted).   Pt grooms at sink with setup and progresses to dress near EOB, standing supported using bed rails to pull up her pants.   Pt requires assist to lace LLE d/t impaired problem-solving.   Pt left in w/c at end of session with call light within reach.     Therapy Documentation Precautions:  Precautions Precautions: Fall Required Braces or Orthoses: Other Brace/Splint Other Brace/Splint: Limb protector LLE Restrictions Weight Bearing Restrictions: Yes LLE Weight Bearing: Non weight bearing Other Position/Activity Restrictions: Limb protector when out of bed   Vital Signs: Therapy Vitals Temp: 97.7 F (36.5 C) Temp Source: Oral Pulse Rate: 89 Resp: 18 BP: (!) 126/58 mmHg Patient Position (if appropriate): Sitting Oxygen Therapy SpO2: 100  % O2 Device: Not Delivered   Pain: Pain Assessment Pain Assessment: 3 Pain Type: Neuropathic pain Pain Location: Leg Pain Orientation: Left Pain Descriptors / Indicators: Aching Pain Onset: On-going Patients Stated Pain Goal: 0 Pain Intervention(s): Medication (See eMAR)   See Function Navigator for Current Functional Status.   Therapy/Group: Individual Therapy   Second session: Time: 1400-1430 Time Calculation (min):  30 min  Pain Assessment: No/denies pain  Skilled Therapeutic Interventions: Therapeutic activity with focus on sit-stand at sink, w/c management (replacing cushions and leg rest), improved awareness.   Pt received with [name].   OT educated daugther and pt on benefit of replacing w/c cushions.   Pt agrees to exchange of cushions and performs sit<>stand 4 times at sink, maintaining supported standing balance for 50 seconds as therapist readjusts cushions.   Pt able to self-propel w/c short distances but requires min assist to reposition w/c within reach of desired items at end of session.   See FIM for current functional status  Therapy/Group: Individual Therapy  [name] 10/15/2015, 9:06 AM

## 2015-10-16 ENCOUNTER — Inpatient Hospital Stay (HOSPITAL_COMMUNITY): Payer: Medicare Other | Admitting: Speech Pathology

## 2015-10-16 ENCOUNTER — Inpatient Hospital Stay (HOSPITAL_COMMUNITY): Payer: Medicare Other | Admitting: Physical Therapy

## 2015-10-16 ENCOUNTER — Inpatient Hospital Stay (HOSPITAL_COMMUNITY): Payer: Medicare Other

## 2015-10-16 DIAGNOSIS — C9000 Multiple myeloma not having achieved remission: Secondary | ICD-10-CM

## 2015-10-16 LAB — GLUCOSE, CAPILLARY
GLUCOSE-CAPILLARY: 146 mg/dL — AB (ref 65–99)
Glucose-Capillary: 160 mg/dL — ABNORMAL HIGH (ref 65–99)
Glucose-Capillary: 75 mg/dL (ref 65–99)
Glucose-Capillary: 103 mg/dL — ABNORMAL HIGH (ref 65–99)

## 2015-10-16 LAB — PROTIME-INR
INR: 1.51 — ABNORMAL HIGH (ref 0.00–1.49)
Prothrombin Time: 18.3 seconds — ABNORMAL HIGH (ref 11.6–15.2)

## 2015-10-16 MED ORDER — WARFARIN SODIUM 5 MG PO TABS
5.0000 mg | ORAL_TABLET | Freq: Once | ORAL | Status: AC
  Administered 2015-10-16: 5 mg via ORAL
  Filled 2015-10-16: qty 1

## 2015-10-16 NOTE — Progress Notes (Addendum)
ANTICOAGULATION CONSULT NOTE - Follow Up Consult  Pharmacy Consult for coumadin Indication: DVT  No Known Allergies  Patient Measurements: Height: 5' (152.4 cm) Weight: 107 lb 5.8 oz (48.7 kg) IBW/kg (Calculated) : 45.5 Heparin Dosing Weight:   Vital Signs: Temp: 98 F (36.7 C) (02/14 0605) Temp Source: Oral (02/14 0605) BP: 134/60 mmHg (02/14 0751) Pulse Rate: 68 (02/14 0605)  Labs:  Recent Labs  10/14/15 0610 10/15/15 0943 10/16/15 0604  LABPROT 16.3* 17.0* 18.3*  INR 1.30 1.37 1.51*    Estimated Creatinine Clearance: 42.3 mL/min (by C-G formula based on Cr of 0.75).   Medications:  Scheduled:  . acyclovir  400 mg Oral BID  . amLODipine  5 mg Oral Daily  . calcium-vitamin D  1 tablet Oral Daily  . cephALEXin  500 mg Oral Q12H  . clopidogrel  75 mg Oral Daily  . docusate sodium  100 mg Oral Daily  . enoxaparin  1.5 mg/kg Subcutaneous Q24H  . glipiZIDE  2.5 mg Oral Q breakfast  . hydrochlorothiazide  12.5 mg Oral Daily  . insulin aspart  0-9 Units Subcutaneous TID WC  . lisinopril  5 mg Oral Daily  . pantoprazole  40 mg Oral Daily  . potassium chloride SA  20 mEq Oral Daily  . Warfarin - Pharmacist Dosing Inpatient   Does not apply q1800   Infusions:    Assessment: 80 yo female with hx of DVT is currently on subtherapeutic coumadin. INR is up to 1.51 after one dose of coumadin 7.5 mg.  Patient is also on lovenox 75 mg sq q24h.  Goal of Therapy:  INR 2-3 Monitor platelets by anticoagulation protocol: Yes   Plan:  Coumadin 5 mg po x1 Lovenox '75mg'$  daily until INR in range  Karyssa Amaral, Tsz-Yin 10/16/2015,8:25 AM

## 2015-10-16 NOTE — Progress Notes (Signed)
Physical Therapy Session Note  Patient Details  Name: [name] MRN: [id number] Date of Birth: [birth year]  Today's Date: 10/16/2015 PT Individual Time: 1305-1402 PT Individual Time Calculation (min): 57 min   Short Term Goals: Week 1:  PT Short Term Goal 1 (Week 1): Pt will perform bed mobility on flat bed with min A PT Short Term Goal 2 (Week 1): Pt will perform bed <> chair transfers with consistent min A PT Short Term Goal 3 (Week 1): Pt will perform car transfer with mod A PT Short Term Goal 4 (Week 1): Pt will perform ambulation with RW x 15' with mod A PT Short Term Goal 5 (Week 1): Pt will perform w/c mobility x 150' with supervision  Skilled Therapeutic Interventions/Progress Updates:    Pt received sitting in recliner with sister-in-law present; pt agreeable to PT treatment. Pt c/o 6/10 L residual limb pain but family reported pt had received pain medication just prior to PT treatment.  Pt assisted with donning L residual limb brace then performing stand pivot transfer from bed>w/c with max verbal & tactile cuing for hand placement/sequencing of task.  Pt educated on maneuvering w/c in small space to turn around in room, with max verbal cuing to push 1 wheel with UE & pull on opposite wheel with opposite UE to complete turn. Pt propelled w/c ~50 ft from room to Therapy Apartment. Pt unable to recall sequencing for set up of w/c near bed & PT re-educated pt on sequencing & set up. Pt then educated on & performed stand pivot transfer from w/c>bed with Mod A, as pt attempted to shuffle RLE towards bed instead of pivoting.  Pt then able to demonstrate sit<>supine with Mod I in traditional bed.  Pt transferred bed>w/c with Min A.  Pt completed transfer to elevated surface for 2nd attempt with CGA and proper stand pivot technique, but required Mod A to transfer back to w/c from bed, as pt attempted to sit before completing entire pivot to w/c seat. Gait training then completed in parallel  bars. PT educated pt on performing BUE shoulder depression & UE extension to assist with raising entire body upwards, to allow RLE to advance forward. Pt attempted to ambulate with this method but continued to vault off of RLE, with very short, unsafe hops. Pt voiced her fear of task & PT re-educated pt on purpose of gait technique and reassured pt of safety during task.  Pt then attempted gait training in parallel bars & was able to demonstrate proper technique with use of BUE for 8 ft in parallel bars, x 2 trials with a sitting rest break in between.  Pt then propelled herself in w/c with BUE back to her room. Pt assisted with stand pivot transfer w/c>recliner with Min A and left in presence of sister-in-law & daughter, with quick release belt in place.   Therapy Documentation Precautions:  Precautions Precautions: Fall Required Braces or Orthoses: Other Brace/Splint Other Brace/Splint: Limb protector LLE Restrictions Weight Bearing Restrictions: Yes LLE Weight Bearing: Non weight bearing Other Position/Activity Restrictions: Limb protector when out of bed   Pain: Pain Assessment Pain Score: 6  (pt recently received pain medication prior to PT session) Pain Location:  (L residual limb)   See Function Navigator for Current Functional Status.   Therapy/Group: Individual Therapy  Waunita Schooner 10/16/2015, 3:44 PM

## 2015-10-16 NOTE — Progress Notes (Signed)
Occupational Therapy Session Note  Patient Details  Name: [name] MRN: [id number] Date of Birth: September 08, 1935  Today's Date: 10/16/2015 OT Individual Time: 1015-1130 OT Individual Time Calculation (min): 75 min   Short Term Goals: Week 1:  OT Short Term Goal 1 (Week 1): Pt will perform toilet transfer with supervision  OT Short Term Goal 2 (Week 1): Pt will perform 3/3 toileting task using sit to stand for hygiene with steadying A OT Short Term Goal 3 (Week 1): Pt will perform 1 of 5 grooming task while standing with steadying A  Skilled Therapeutic Interventions/Progress Updates: ADL-retraining (60 min) at shower level with focus on improved sequencing, transfers, awareness/attention, and dynamic standing balance.   Pt received seated in w/c awaiting therapist, oriented X4 w/o family present.   With setup to position w/c near doorway of bathroom, pt ambulates to shower with min guard assist and performs stand-pivot transfer to shower chair with min vc for technique using grab bar to lower.   Pt showers sitting and standing with supervision and min assist only to scrub her back thoroughly.   Pt returns to w/c and dresses positioned near bed rail, standing to pull up underwear and pants with min guard while standing and min vc to support herself forward using bed rail and intermittent vc for sequencing.    Pt reported mild fatigue and rested before completing homemaking task with remaining time (15 min) to load laundry into washer while standing supported at washer.   Pt returned to her room and recovered to recliner with setup to attach QRB and provide controls/call light.     Therapy Documentation Precautions:  Precautions Precautions: Fall Required Braces or Orthoses: Other Brace/Splint Other Brace/Splint: Limb protector LLE Restrictions Weight Bearing Restrictions: Yes LLE Weight Bearing: Non weight bearing Other Position/Activity Restrictions: Limb protector when out of bed  Vital  Signs: Therapy Vitals Pulse Rate: 98 Resp: 19 BP: (!) 160/85 mmHg Patient Position (if appropriate): Sitting   Pain: Pain Assessment Pain Score: 8  Pain Type: Surgical pain Pain Location: Leg Pain Orientation: Left Pain Descriptors / Indicators: Aching Pain Frequency: Occasional Pain Onset: On-going Patients Stated Pain Goal: 3 Pain Intervention(s): Medication (See eMAR)  See Function Navigator for Current Functional Status.   Therapy/Group: Individual Therapy  Annis Lagoy 10/16/2015, 11:48 AM

## 2015-10-16 NOTE — Progress Notes (Addendum)
PHYSICAL MEDICINE & REHABILITATION     PROGRESS NOTE  Subjective/Complaints:  Patient seen this morning working with speech therapy. She continues to be slow and confused at times.  ROS: Denies stump pain, CP, SOB, N/V/D  Objective: Vital Signs: Blood pressure 134/60, pulse 68, temperature 98 F (36.7 C), temperature source Oral, resp. rate 18, height 5' (1.524 m), weight 48.7 kg (107 lb 5.8 oz), SpO2 99 %. No results found. No results for input(s): WBC, HGB, HCT, PLT in the last 72 hours. No results for input(s): NA, K, CL, GLUCOSE, BUN, CREATININE, CALCIUM in the last 72 hours.  Invalid input(s): CO CBG (last 3)   Recent Labs  10/15/15 1707 10/15/15 2116 10/16/15 0653  GLUCAP 101* 107* 146*    Wt Readings from Last 3 Encounters:  10/12/15 48.7 kg (107 lb 5.8 oz)  10/09/15 49.6 kg (109 lb 5.6 oz)  09/26/15 50.803 kg (112 lb)    Physical Exam:  BP 134/60 mmHg  Pulse 68  Temp(Src) 98 F (36.7 C) (Oral)  Resp 18  Ht 5' (1.524 m)  Wt 48.7 kg (107 lb 5.8 oz)  BMI 20.97 kg/m2  SpO2 99% Gen: frail appearing. No acute distress. Vital signs reviewed. HENT: Normocephalic, atraumatic.  Eyes: EOMI and and conjunctiva within normal limits. Cardiovascular: Normal rate and regular rhythm. no murmur or rubs Respiratory: Effort normal and breath sounds normal. No respiratory distress.  GI: Soft. Bowel sounds are normal. She exhibits no distension. No pain Neurological:  Patient is alert and oriented x3 with cues. Follows basic commands and answer simple questions.  B/l UE 4-/5 proximal distal.  RLE: Hip flexion , knee extension 2+/5, ankle dorsi/plantar flexion 4-/5.  LLE: Hip flexion 3-/5  Dysarthric speech Skin: BKA with staples, serous drainage over medial and lateral aspects, and central area with small blister.    Psych: pleasant, slow, flat, quiet  Assessment/Plan: 1. Functional deficits secondary to peripheral vascular disease status post left BKA  10/09/2015 which require 3+ hours per day of interdisciplinary therapy in a comprehensive inpatient rehab setting. Physiatrist is providing close team supervision and 24 hour management of active medical problems listed below. Physiatrist and rehab team continue to assess barriers to discharge/monitor patient progress toward functional and medical goals.  Function:  Bathing Bathing position   Position: Shower  Bathing parts Body parts bathed by patient: Right arm, Left arm, Chest, Abdomen, Front perineal area, Buttocks, Right upper leg, Left upper leg, Right lower leg, Left lower leg Body parts bathed by helper: Back  Bathing assist Assist Level: Touching or steadying assistance(Pt > 75%)      Upper Body Dressing/Undressing Upper body dressing   What is the patient wearing?: Pull over shirt/dress, Bra Bra - Perfomed by patient: Thread/unthread right bra strap, Thread/unthread left bra strap Bra - Perfomed by helper: Hook/unhook bra (pull down sports bra) Pull over shirt/dress - Perfomed by patient: Thread/unthread right sleeve, Thread/unthread left sleeve, Put head through opening, Pull shirt over trunk          Upper body assist Assist Level: Touching or steadying assistance(Pt > 75%)      Lower Body Dressing/Undressing Lower body dressing   What is the patient wearing?: Underwear, Pants, Socks, Shoes Underwear - Performed by patient: Thread/unthread right underwear leg, Pull underwear up/down Underwear - Performed by helper: Thread/unthread left underwear leg Pants- Performed by patient: Thread/unthread right pants leg, Pull pants up/down Pants- Performed by helper: Thread/unthread left pants leg  Socks - Performed by helper: Don/doff right sock   Shoes - Performed by helper: Don/doff right shoe, Fasten right          Lower body assist Assist for lower body dressing: Touching or steadying assistance (Pt > 75%)      Toileting Toileting Toileting activity did not  occur: No continent bowel/bladder event Toileting steps completed by patient: Performs perineal hygiene Toileting steps completed by helper: Adjust clothing prior to toileting, Performs perineal hygiene, Adjust clothing after toileting    Toileting assist Assist level: Touching or steadying assistance (Pt.75%)   Transfers Chair/bed transfer   Chair/bed transfer method: Squat pivot Chair/bed transfer assist level: Touching or steadying assistance (Pt > 75%) Chair/bed transfer assistive device: Armrests     Locomotion Ambulation     Max distance: 5 Assist level: Moderate assist (Pt 50 - 74%)   Wheelchair   Type: Manual Max wheelchair distance: 150' Assist Level: Touching or steadying assistance (Pt > 75%)  Cognition Comprehension Comprehension assist level: Follows basic conversation/direction with extra time/assistive device  Expression Expression assist level: Expresses complex 90% of the time/cues < 10% of the time  Social Interaction Social Interaction assist level: Interacts appropriately 90% of the time - Needs monitoring or encouragement for participation or interaction.  Problem Solving Problem solving assist level: Solves basic 90% of the time/requires cueing < 10% of the time  Memory Memory assist level: Recognizes or recalls 90% of the time/requires cueing < 10% of the time     Medical Problem List and Plan: 1. Ischemic left lower extremity with gait and mobility secondary to peripheral vascular disease status post left BKA 10/09/2015  Continue CIR 2. DVT Prophylaxis/Anticoagulation: Chronic Coumadin for history of DVT. Monitor for any bleeding episodes  INR 1.51 on 2/14, labs remaining pending  Will d/c Lovenox once Coumadin therapeutic 3. Pain Management: Presently on oxycodone. Monitor with increased mobility  Pain controlled 4. Multiple myeloma. Follow-up oncology service Dr. Julien Nordmann on plan of care for any further chemotherapy to be addressed as an  outpatient 5. Neuropsych: This patient is capable of making decisions on her own behalf. 6. Skin/Wound Care: Routine skin checks 7. Fluids/Electrolytes/Nutrition: Routine I&O's with follow-up chemistries 8. Acute blood loss anemia.   Labs remain pending 9. Mood/anxiety. Xanax 0.5 mg twice daily as needed. Provide emotional support 10. Diabetes mellitus and peripheral neuropathy. Hemoglobin A1c 6.6. Glucotrol XL 2.5 mg daily. Check blood sugars before meals and at bedtime. Diabetic teaching  Fasting CBG 146 this AM 11. Hypertension. Lisinopril 20 mg daily, HCTZ 12.5 mg daily, Norvasc 5 mg daily. Monitor with increased mobility  LOS (Days) 4 A FACE TO FACE EVALUATION WAS PERFORMED  Liani Caris Lorie Phenix 10/16/2015 8:25 AM

## 2015-10-16 NOTE — Progress Notes (Signed)
Social Work Patient ID: [name], female   DOB: 01-29-1936, 80 y.o.   MRN: [id number] Spoke with Andrea-daughter to discuss Speech evaluation and will not be following throughout stay due to at baseline. Daughter understood and is more worried about making sure she is not getting morphine And now she is constipated and has not been this way before. Aware she is not eating well and taking oxycodone here. Informed audiologist is done on a outpatient basis, daughter concerned about her hearing. Will update after team conference tomorrow.

## 2015-10-16 NOTE — Plan of Care (Signed)
Problem: RH SKIN INTEGRITY Goal: RH STG ABLE TO PERFORM INCISION/WOUND CARE W/ASSISTANCE STG Able To Perform Incision/Wound Care With Assistance. Max A  Outcome: Not Progressing Total A

## 2015-10-16 NOTE — Progress Notes (Signed)
Social Work  Social Work Assessment and Plan  Patient Details  Name: [name] MRN: [id number] Date of Birth: November 05, 1935  Today's Date: 10/16/2015  Problem List:  Patient Active Problem List   Diagnosis Date Noted  . Chronic anticoagulation   . History of DVT (deep vein thrombosis)   . Acute blood loss anemia   . Post-operative pain   . Status post below knee amputation of left lower extremity ([place]) 10/12/2015  . PAD (peripheral artery disease) ([place]) 10/09/2015  . Dry gangrene ([place]) 09/13/2015  . Cellulitis 09/09/2015  . PVD (peripheral vascular disease) ([place]) 09/09/2015  . Encounter for antineoplastic chemotherapy 06/26/2015  . Chronic pain 02/07/2015  . Hypokalemia 11/28/2014  . Long term current use of anticoagulant therapy 11/28/2014  . Fissure in skin of foot 11/28/2014  . Sinus bradycardia 09/08/2014  . DNR no code (do not resuscitate) 09/08/2013  . Chronic steroid use 09/08/2013  . Deep vein thrombosis of left lower extremity ([place]) 05/12/2013  . Deep vein thrombosis of right lower extremity ([place]) 05/12/2013  . Left hip pain 05/16/2012  . Gait instability 05/08/2011  . Multiple myeloma ([place]) 03/29/2010  . Diabetes mellitus without complication ([place]) 92/07/9416  . DEPRESSION, MILD 03/29/2010  . LEG PAIN, RIGHT 03/29/2010  . Hyperlipidemia 05/19/2008  . Essential hypertension 05/19/2008  . Osteoarthritis 05/19/2008   Past Medical History:  Past Medical History  Diagnosis Date  . Blood transfusion   . Depression     Mild  . History of chicken pox   . Hyperlipidemia   . Hypertension   . Degenerative joint disease   . Phlebitis   . Multiple myeloma     per Dr. Julien Nordmann, s/p palliative radiation for leg pain 2011 per Dr. Sondra Come  . Deep vein thrombosis of bilateral lower extremities ([facility])   . Cardiomyopathy ([place])   . Diabetes mellitus     Steroid related  . UTI (urinary tract infection) 09/08/2013  . History of radiation therapy 08/30/13-09/20/13    20 gray  to lower lumbar/upper sacrum  . Heart murmur   . Anxiety   . Anemia   . Family history of anesthesia complication     DAUGHTER CPR AFTER  . History of shingles    Past Surgical History:  Past Surgical History  Procedure Laterality Date  . Abdominal hysterectomy    . Ankle fracture surgery Left   . Ear cyst excision Left 04/16/2015    Procedure: EXCISION FACIAL CYST LEFT SIDE ;  Surgeon: Izora Gala, MD;  Location: [facility];  Service: ENT;  Laterality: Left;  . Peripheral vascular catheterization  08/14/2015    Procedure: Lower Extremity Intervention;  Surgeon: Katha Cabal, MD;  Location: [facility];  Service: Cardiovascular;;  . Peripheral vascular catheterization N/A 08/14/2015    Procedure: Abdominal Aortogram w/Lower Extremity;  Surgeon: Katha Cabal, MD;  Location: [facility];  Service: Cardiovascular;  Laterality: N/A;  . Colonoscopy    . Eye surgery Bilateral     cataract surgery  . Amputation Left 10/09/2015    Procedure: Left Leg AMPUTATION BELOW KNEE;  Surgeon: Angelia Mould, MD;  Location: [facility];  Service: Vascular;  Laterality: Left;   Social History:  reports that she quit smoking about 42 years ago. She has never used smokeless tobacco. She reports that she does not drink alcohol or use illicit drugs.  Family / Support Systems Marital Status: Widow/Widower Patient Roles: Parent, Other (Comment) (Cancer Patient) Children: [name]  [phone]  [phone]  [phone] Other Supports: Kim-daughter [phone]  [phone] and son whom lives with pt Anticipated Caregiver: Unsure at this time-trying to come up with a plan Ability/Limitations of Caregiver: [name] works, pt's son is in a wheelchair and her other daughter-Kim has also had a CVA and still receiving treatment for  Caregiver Availability: Other (Comment) [name] trying to come up with a 24 hr care plan) Family Dynamics: Close knit family who help one  another, it has been difficult with daughter havinga stroke and now pt having a amputation. Maudie Mercury is staying with her sister-Andrea due to pt would try to help her too much. Kim unable to assist her Mom unless it is supervision level only and son can only be there for her. [name] to have a family meeting with rest of the family to see if can provide assist.  Social History Preferred language: English Religion: Baptist Cultural Background: No issues Education: [employer] Read: Yes Write: Yes Employment Status: Retired Freight forwarder Issues: No issues Guardian/Conservator: None-according to MD pt is capable of making her own decisions while here. Will make sure [name] is involved also due to pt is not always following the conversation and appears to have cognitive deficits. Speech to eval today 2/14   Abuse/Neglect Physical Abuse: Denies Verbal Abuse: Denies Sexual Abuse: Denies Exploitation of patient/patient's resources: Denies Self-Neglect: Denies  Emotional Status Pt's affect, behavior adn adjustment status: Pt is participating in therapies but needs cues and team has noticed cognitive deficits. She has always been independent and able to take care of herself, so this is very new to her. She has been treated for myeloma for eight years and has remained at a independent level. It seems pt needs extra time to process information given to her or asked of her. Recent Psychosocial Issues: other health isses-multiple myeloma, diabetes, chronic pain, etc Pyschiatric History: History of depression takes medication for this and finds it helpful. May be benificial to have neuro-psych see while here for coping and testing to assist team with pt's mental capacity Substance Abuse History: No issues  Patient / Family Perceptions, Expectations & Goals Pt/Family understanding of illness & functional limitations: Pt has a basic understanding of her amputation, daughter is the one who talks  with MD and has a good understanding of her Mom's condition, but is concerned about the cognition issues team is seeing. She reports: " She was not like that prior to her amputation."  Premorbid pt/family roles/activities: Mom, Grandmother, cancer patient, retiree, church member, etc Anticipated changes in roles/activities/participation: resume Pt/family expectations/goals: Pt states: " I want to go home."  [name] states: " I want her to be safe and have care at home."  Maudie Mercury states: " I can only do so much I will though."  [employer]: Other (Comment) ([place]) Premorbid Home Care/DME Agencies: None Transportation available at discharge: family members Resource referrals recommended: Neuropsychology, Support group (specify)  Discharge Planning Living Arrangements: Children Support Systems: Children, Other relatives, Friends/neighbors, Social worker community Type of Residence: Private residence Insurance Resources: Multimedia programmer (specify) Primary school teacher) Financial Resources: Radio broadcast assistant Screen Referred: No Living Expenses: Lives with family Money Management: Patient, Family Does the patient have any problems obtaining your medications?: No Home Management: Son does the home management Patient/Family Preliminary Plans: Unsure at this time, daughter had arranged for [place] to come in and have pt sign forms to being process of eligibility and pt refused. Aware pt will require 24 hr care at discharge  and daughter is meeting with the fmaily members to see who can offer what care for Mom. Aware of the options of 24 hr hired assist or short term NHP. Social Work Anticipated Follow Up Needs: HH/OP, SNF, Support Group  Clinical Impression Pleasant female who is participating in therapies but seems to be disconnected and needs cueing and repeated commands. Speech evaluation today to assist with cognitive issues and follow up. Daughter is working  on trying to obtain 24 hr care form family members or hiring assist, but may need to purse NHP if this can not be arranged prior to pt's discharge. Pt's son and daughter have health issues of their own and can only provide supervision level. Need to see how much progress pt makes and develop a safe discharge plan. Pt may be difficult to place if becomes NHP due to her chemo for her multiple myeloma.  Elease Hashimoto 10/16/2015, 10:49 AM

## 2015-10-16 NOTE — Evaluation (Signed)
Speech Language Pathology Assessment and Plan  Patient Details  Name: [name] MRN: [id number] Date of Birth: March 14, 1936   Today's Date: 10/16/2015 SLP Individual Time: 0800-0900 SLP Individual Time Calculation (min): 60 min   Problem List:  Patient Active Problem List   Diagnosis Date Noted  . Chronic anticoagulation   . History of DVT (deep vein thrombosis)   . Acute blood loss anemia   . Post-operative pain   . Status post below knee amputation of left lower extremity ([place]) 10/12/2015  . PAD (peripheral artery disease) ([place]) 10/09/2015  . Dry gangrene ([place]) 09/13/2015  . Cellulitis 09/09/2015  . PVD (peripheral vascular disease) ([place]) 09/09/2015  . Encounter for antineoplastic chemotherapy 06/26/2015  . Chronic pain 02/07/2015  . Hypokalemia 11/28/2014  . Long term current use of anticoagulant therapy 11/28/2014  . Fissure in skin of foot 11/28/2014  . Sinus bradycardia 09/08/2014  . DNR no code (do not resuscitate) 09/08/2013  . Chronic steroid use 09/08/2013  . Deep vein thrombosis of left lower extremity ([place]) 05/12/2013  . Deep vein thrombosis of right lower extremity ([place]) 05/12/2013  . Left hip pain 05/16/2012  . Gait instability 05/08/2011  . Multiple myeloma ([place]) 03/29/2010  . Diabetes mellitus without complication ([place]) 89/21/1941  . DEPRESSION, MILD 03/29/2010  . LEG PAIN, RIGHT 03/29/2010  . Hyperlipidemia 05/19/2008  . Essential hypertension 05/19/2008  . Osteoarthritis 05/19/2008   Past Medical History:  Past Medical History  Diagnosis Date  . Blood transfusion   . Depression     Mild  . History of chicken pox   . Hyperlipidemia   . Hypertension   . Degenerative joint disease   . Phlebitis   . Multiple myeloma     per Dr. Julien Nordmann, s/p palliative radiation for leg pain 2011 per Dr. Sondra Come  . Deep vein thrombosis of bilateral lower extremities (HCC)   . Cardiomyopathy ([place])   . Diabetes mellitus     Steroid related  . UTI (urinary tract  infection) 09/08/2013  . History of radiation therapy 08/30/13-09/20/13    20 gray to lower lumbar/upper sacrum  . Heart murmur   . Anxiety   . Anemia   . Family history of anesthesia complication     DAUGHTER CPR AFTER  . History of shingles    Past Surgical History:  Past Surgical History  Procedure Laterality Date  . Abdominal hysterectomy    . Ankle fracture surgery Left   . Ear cyst excision Left 04/16/2015    Procedure: EXCISION FACIAL CYST LEFT SIDE ;  Surgeon: Izora Gala, MD;  Location: [facility];  Service: ENT;  Laterality: Left;  . Peripheral vascular catheterization  08/14/2015    Procedure: Lower Extremity Intervention;  Surgeon: Katha Cabal, MD;  Location: [facility];  Service: Cardiovascular;;  . Peripheral vascular catheterization N/A 08/14/2015    Procedure: Abdominal Aortogram w/Lower Extremity;  Surgeon: Katha Cabal, MD;  Location: [facility];  Service: Cardiovascular;  Laterality: N/A;  . Colonoscopy    . Eye surgery Bilateral     cataract surgery  . Amputation Left 10/09/2015    Procedure: Left Leg AMPUTATION BELOW KNEE;  Surgeon: Angelia Mould, MD;  Location: [facility];  Service: Vascular;  Laterality: Left;    Assessment / Plan / Recommendation Clinical Impression   [name] is a 80 y.o. right handed female with history of multiple myeloma diagnosed 2009 and has relapsed followed by Dr. Julien Nordmann as patient  had been receiving chemotherapy with first cycle 09/05/2014 15 cycles.  Presented 10/09/2015 with a gangrenous left foot. Underwent left BKA 10/09/2015 per Dr. [name].  Physical and occupational therapy evaluations completed with recommendations of physical medicine rehabilitation consult. Patient was admitted for a comprehensive rehabilitation program.  SLP evaluation ordered after cognitive deficits were noted by PT/OT.  SLP evaluation completed on 10/16/2015 with the following results:  Pt  presents with moderately severe cognitive deficits across all domains.  Pt scored a 13/30 on the MoCA-Basic (n>/= 26) with deficits characterized by impaired executive function, naming fluency, calculation, abstraction, delayed recall, and attention.  Pt with long standing history of small vessel changes and now with brain mets from multiple myeloma.  Pt did not drive prior to admission, nor did she manage her own household responsibilities independently.  Though no family was present to verify, anticipate that pt is near baseline.  As a result, no further ST needs are indicated at this time.     Skilled Therapeutic Interventions          Cognitive-linguistic evaluation completed with results and recommendations reviewed with patient.     SLP Assessment  Patient does not need any further Speech Lanaguage Pathology Services    Recommendations  Patient destination: Home Follow up Recommendations: None Equipment Recommended: None recommended by SLP           Pain Pain Assessment Pain Assessment: No/denies pain  Prior Functioning Cognitive/Linguistic Baseline: Baseline deficits Baseline deficit details: brain mets and hx of small vessel disease; pt reports she was not driving prior to admission and had assistance with managing her medications and finances, son lived with her and was there "most of the time" Type of Home: House  Lives With: Son Available Help at Discharge: Family;Available 24 hours/day;Personal care attendant  Function:  Eating Eating                 Cognition Comprehension Comprehension assist level: Follows basic conversation/direction with extra time/assistive device  Expression   Expression assist level: Expresses basic needs/ideas: With extra time/assistive device  Social Interaction Social Interaction assist level: Interacts appropriately 75 - 89% of the time - Needs redirection for appropriate language or to initiate interaction.  Problem Solving Problem  solving assist level: Solves basic 50 - 74% of the time/requires cueing 25 - 49% of the time  Memory Memory assist level: Recognizes or recalls 50 - 74% of the time/requires cueing 25 - 49% of the time     Refer to Care Plan for Long Term Goals  Recommendations for other services: Neuropsych  Discharge Criteria: Patient will be discharged from SLP if patient refuses treatment 3 consecutive times without medical reason, if treatment goals not met, if there is a change in medical status, if patient makes no progress towards goals or if patient is discharged from hospital.  The above assessment, treatment plan, treatment alternatives and goals were discussed and mutually agreed upon: by patient  Emilio Math 10/16/2015, 12:23 PM

## 2015-10-17 ENCOUNTER — Inpatient Hospital Stay (HOSPITAL_COMMUNITY): Payer: Medicare Other | Admitting: Physical Therapy

## 2015-10-17 ENCOUNTER — Inpatient Hospital Stay (HOSPITAL_COMMUNITY): Payer: Medicare Other | Admitting: Occupational Therapy

## 2015-10-17 ENCOUNTER — Telehealth: Payer: Self-pay | Admitting: Vascular Surgery

## 2015-10-17 ENCOUNTER — Inpatient Hospital Stay (HOSPITAL_COMMUNITY): Payer: Medicare Other

## 2015-10-17 LAB — BASIC METABOLIC PANEL
Anion gap: 14 (ref 5–15)
BUN: 10 mg/dL (ref 6–20)
CALCIUM: 9.6 mg/dL (ref 8.9–10.3)
CO2: 21 mmol/L — AB (ref 22–32)
Chloride: 105 mmol/L (ref 101–111)
Creatinine, Ser: 0.9 mg/dL (ref 0.44–1.00)
Glucose, Bld: 91 mg/dL (ref 65–99)
POTASSIUM: 4.1 mmol/L (ref 3.5–5.1)
Sodium: 140 mmol/L (ref 135–145)

## 2015-10-17 LAB — CBC WITH DIFFERENTIAL/PLATELET
Basophils Absolute: 0 10*3/uL (ref 0.0–0.1)
Basophils Relative: 0 %
Eosinophils Absolute: 0 10*3/uL (ref 0.0–0.7)
Eosinophils Relative: 0 %
HEMATOCRIT: 30.1 % — AB (ref 36.0–46.0)
HEMOGLOBIN: 9.7 g/dL — AB (ref 12.0–15.0)
LYMPHS ABS: 0.5 10*3/uL — AB (ref 0.7–4.0)
Lymphocytes Relative: 7 %
MCH: 30.3 pg (ref 26.0–34.0)
MCHC: 32.2 g/dL (ref 30.0–36.0)
MCV: 94.1 fL (ref 78.0–100.0)
MONOS PCT: 5 %
Monocytes Absolute: 0.3 10*3/uL (ref 0.1–1.0)
NEUTROS ABS: 5.5 10*3/uL (ref 1.7–7.7)
NEUTROS PCT: 88 %
Platelets: 390 10*3/uL (ref 150–400)
RBC: 3.2 MIL/uL — AB (ref 3.87–5.11)
RDW: 15 % (ref 11.5–15.5)
WBC: 6.3 10*3/uL (ref 4.0–10.5)

## 2015-10-17 LAB — GLUCOSE, CAPILLARY
GLUCOSE-CAPILLARY: 77 mg/dL (ref 65–99)
Glucose-Capillary: 127 mg/dL — ABNORMAL HIGH (ref 65–99)
Glucose-Capillary: 139 mg/dL — ABNORMAL HIGH (ref 65–99)
Glucose-Capillary: 99 mg/dL (ref 65–99)

## 2015-10-17 LAB — PROTIME-INR
INR: 1.69 — ABNORMAL HIGH (ref 0.00–1.49)
PROTHROMBIN TIME: 19.9 s — AB (ref 11.6–15.2)

## 2015-10-17 MED ORDER — OXYCODONE HCL 5 MG PO TABS
10.0000 mg | ORAL_TABLET | ORAL | Status: DC | PRN
  Administered 2015-10-17 – 2015-10-18 (×3): 10 mg via ORAL
  Filled 2015-10-17 (×3): qty 2

## 2015-10-17 MED ORDER — OXYCODONE HCL 5 MG PO TABS
5.0000 mg | ORAL_TABLET | ORAL | Status: DC | PRN
  Administered 2015-10-17 – 2015-10-19 (×4): 5 mg via ORAL
  Filled 2015-10-17 (×4): qty 1

## 2015-10-17 MED ORDER — WARFARIN SODIUM 7.5 MG PO TABS
7.5000 mg | ORAL_TABLET | Freq: Once | ORAL | Status: AC
  Administered 2015-10-17: 7.5 mg via ORAL
  Filled 2015-10-17: qty 1

## 2015-10-17 NOTE — Telephone Encounter (Signed)
-----   Message from Mena Goes, RN sent at 10/12/2015 11:18 AM EST ----- Regarding: schedule   ----- Message -----    From: Ulyses Amor, PA-C    Sent: 10/12/2015  10:53 AM      To: [facility] Charge Pool  F/U with Dr. Scot Dock S/P left BKA  4 weeks

## 2015-10-17 NOTE — Patient Care Conference (Signed)
Inpatient RehabilitationTeam Conference and Plan of Care Update Date: 10/17/2015   Time: 2:35 PM    Patient Name: [name]      Medical Record Number: [id number]  Date of Birth: 05-30-1936 Sex: Female         Room/Bed: 4M01C/4M01C-01 Payor Info: Payor: [employer] MEDICARE / Plan: [employer] MEDICARE / Product Type: *No Product type* /    Admitting Diagnosis: BKA  Admit Date/Time:  10/12/2015  5:29 PM Admission Comments: No comment available   Primary Diagnosis:  Status post below knee amputation of left lower extremity ([place]) Principal Problem: Status post below knee amputation of left lower extremity [facility])  Patient Active Problem List   Diagnosis Date Noted  . Chronic anticoagulation   . History of DVT (deep vein thrombosis)   . Acute blood loss anemia   . Post-operative pain   . Status post below knee amputation of left lower extremity ([place]) 10/12/2015  . PAD (peripheral artery disease) ([place]) 10/09/2015  . Dry gangrene ([place]) 09/13/2015  . Cellulitis 09/09/2015  . PVD (peripheral vascular disease) ([place]) 09/09/2015  . Encounter for antineoplastic chemotherapy 06/26/2015  . Chronic pain 02/07/2015  . Hypokalemia 11/28/2014  . Long term current use of anticoagulant therapy 11/28/2014  . Fissure in skin of foot 11/28/2014  . Sinus bradycardia 09/08/2014  . DNR no code (do not resuscitate) 09/08/2013  . Chronic steroid use 09/08/2013  . Deep vein thrombosis of left lower extremity ([place]) 05/12/2013  . Deep vein thrombosis of right lower extremity ([place]) 05/12/2013  . Left hip pain 05/16/2012  . Gait instability 05/08/2011  . Multiple myeloma ([place]) 03/29/2010  . Diabetes mellitus without complication ([place]) 78/29/5621  . DEPRESSION, MILD 03/29/2010  . LEG PAIN, RIGHT 03/29/2010  . Hyperlipidemia 05/19/2008  . Essential hypertension 05/19/2008  . Osteoarthritis 05/19/2008    Expected Discharge Date: Expected Discharge Date: 10/20/15  Team Members Present: Physician  leading conference: Dr. Delice Lesch Social Worker Present: Ovidio Kin, LCSW Nurse Present: Dorien Chihuahua, RN PT Present: Carney Living, PT OT Present: Willeen Cass, OT PPS Coordinator present : Daiva Nakayama, RN, CRRN     Current Status/Progress Goal Weekly Team Focus  Medical   Ischemic left lower extremity with gait and mobility secondary to peripheral vascular disease status post left BKA 10/09/2015 and cognitive issues, abnormal lab values  improve mobility, cognitive issues  see above   Bowel/Bladder   Continent of bowel and bladder. LBM 10/14/13  Pt to remain continent of bowel and bladder  Monitor   Swallow/Nutrition/ Hydration     na        ADL's   Overall min assist (steadying) for transfers, BADL  Overall supervision  Improved memory/awareness, transfers, adapted bathing/dressing skills, transfers, activity tolerance.   Mobility   mod I bed mobility, min A level transfers, mod A uneven tranfers, min A w/c mobility, min-mod A gait-requires constant cues for safety and sequencing  Supervision overall except min A car transfer and gait short distances  Sequencing and safety with all mobility, strength, balance, gait, family education   Communication     Speech evaluation not going to follow pt back to baseline functioning        Safety/Cognition/ Behavioral Observations    no unsafe behaviors        Pain   Oxy IR '10mg'$  q 4hrs prn for L BKA discomfort  <3  Monitor for effectiveness of medication   Skin   L BKA w/staples approximated. Visible blister to L posterior  incisional line that drains serous drainage intermittently  No additional skin breakdown  Monitor for appropriate healing      *See Care Plan and progress notes for long and short-term goals.  Barriers to Discharge: Abnormal labs, subtherapeutic anticoagulation    Possible Resolutions to Barriers:  Follow labs, transition to oral anticoagulation, pt and safety issues    Discharge Planning/Teaching Needs:   Family working on 24 hr care plan-son in a wheelchair and daughter past hx of CVA and not staying with them currently. Aware of discharge options.      Team Discussion:  Pt making progress toward her goals-brighter and participating in therapies. Goals supervision transfers and min assist ambulation. Need to draw labs and check coumadin levels-were sub-therapeutic. Family needs to learn stump wrapping prior to discharge. Pain controlled. Constant cues for safety  Revisions to Treatment Plan:  None   Continued Need for Acute Rehabilitation Level of Care: The patient requires daily medical management by a physician with specialized training in physical medicine and rehabilitation for the following conditions: Daily direction of a multidisciplinary physical rehabilitation program to ensure safe treatment while eliciting the highest outcome that is of practical value to the patient.: Yes Daily medical management of patient stability for increased activity during participation in an intensive rehabilitation regime.: Yes Daily analysis of laboratory values and/or radiology reports with any subsequent need for medication adjustment of medical intervention for : Post surgical problems;Neurological problems;Wound care problems;Mood/behavior problems  Elease Hashimoto 10/17/2015, 2:43 PM

## 2015-10-17 NOTE — Progress Notes (Addendum)
Physical Therapy Session Note  Patient Details  Name: [name] MRN: [id number] Date of Birth: October 03, 1935  Today's Date: 10/17/2015 PT Individual Time: 0830-0930 and 1130-1200 PT Individual Time Calculation (min): 60 min and 30 min (total 90 min)   Short Term Goals: Week 1:  PT Short Term Goal 1 (Week 1): Pt will perform bed mobility on flat bed with min A PT Short Term Goal 2 (Week 1): Pt will perform bed <> chair transfers with consistent min A PT Short Term Goal 3 (Week 1): Pt will perform car transfer with mod A PT Short Term Goal 4 (Week 1): Pt will perform ambulation with RW x 15' with mod A PT Short Term Goal 5 (Week 1): Pt will perform w/c mobility x 150' with supervision  Skilled Therapeutic Interventions/Progress Updates:    Tx 1: Pt received sitting in bed, L residual limb unwrapped, no c/o pain and agreeable to treatment. Therapist wrapped residual limb with totalA due to urgency with pt reporting she needs to use restroom. Transfer bed>w/c with close S. In bathroom, stand pivot transfer with arm rests and grab bars with min guard. Pt seated alone for privacy with instruction to call therapist when finished before getting up. Pt alerts therapist and when therapist walked into bathroom pt had already pulled pants up in standing and was completing a stand pivot transfer back to w/c. Discussed with pt and her son the importance of waiting for staff before getting up for safety purposes. In ADL apartment, pt cued to perform transfer to bed. Required max verbal cues for proper w/c setup with pt initially leaving large gap between chair and bed, not locking brakes, and starting to stand up with amputee pad still on chair. Once setup is complete, transfers with S. Pt then cued to perform transfer to couch; again requires max verbal cues for safety and w/c setup, however able to verbalize "Get as close as I can, lock the brakes, move things out of the way" with min cues. Pt then returned to  bed to assess carryover of previous trial, required mod cueing however increased time with pt repetitively altering w/c setup, armrest on/off, brakes on/off repetitively until cued to complete transfer.  Final transfer w/c <>couch with pt requiring only minimal cueing for w/c setup. W/c propulsion 2 x 150' with BUE, RLE with S. Remained seated in w/c at completion of session, quick release belt intact and all needs within reach.  Tx 2: Pt received seated in recliner, denies pain and agreeable to treatment. Transfer recliner<>w/c at beginning/end of session with close S and w/c setup totalA for time management. W/c propulsion 2 x300' including ramp x2 trials with BUE and S. Transferred into 16x16 w/c for improved UE positioning and efficiency during propulsion, posture and alignment. Car transfer performed with close S; mod verbal cues for w/c setup. Returned to room with pt propelling x100'. Returned to recliner as described above; remained seated in recliner with quick release belt intact and all needs within reach at completion of session.   Therapy Documentation Precautions:  Precautions Precautions: Fall Required Braces or Orthoses: Other Brace/Splint Other Brace/Splint: Limb protector LLE Restrictions Weight Bearing Restrictions: Yes LLE Weight Bearing: Non weight bearing Other Position/Activity Restrictions: Limb protector when out of bed Pain: Pain Assessment Pain Assessment: No/denies pain Pain Score: 0-No pain   See Function Navigator for Current Functional Status.   Therapy/Group: Individual Therapy  Luberta Mutter 10/17/2015, 10:04 AM

## 2015-10-17 NOTE — Telephone Encounter (Signed)
LM for pt re appt, dpm °

## 2015-10-17 NOTE — Progress Notes (Addendum)
[facility] PHYSICAL MEDICINE & REHABILITATION     PROGRESS NOTE  Subjective/Complaints:  Patient seen resting comfortably in bed this morning upon arrival. Family at bedside. She appears to have better insight today and recalls discussions regarding her stump protector.  ROS: Denies stump pain, CP, SOB, N/V/D  Objective: Vital Signs: Blood pressure 132/61, pulse 75, temperature 98.9 F (37.2 C), temperature source Oral, resp. rate 18, height 5' (1.524 m), weight 48.7 kg (107 lb 5.8 oz), SpO2 99 %. No results found. No results for input(s): WBC, HGB, HCT, PLT in the last 72 hours. No results for input(s): NA, K, CL, GLUCOSE, BUN, CREATININE, CALCIUM in the last 72 hours.  Invalid input(s): CO CBG (last 3)   Recent Labs  10/16/15 1635 10/16/15 2056 10/17/15 0644  GLUCAP 75 103* 127*    Wt Readings from Last 3 Encounters:  10/12/15 48.7 kg (107 lb 5.8 oz)  10/09/15 49.6 kg (109 lb 5.6 oz)  09/26/15 50.803 kg (112 lb)    Physical Exam:  BP 132/61 mmHg  Pulse 75  Temp(Src) 98.9 F (37.2 C) (Oral)  Resp 18  Ht 5' (1.524 m)  Wt 48.7 kg (107 lb 5.8 oz)  BMI 20.97 kg/m2  SpO2 99% Gen: frail appearing. No acute distress. Vital signs reviewed. HENT: Normocephalic, atraumatic.  Eyes: EOMI and and conjunctiva within normal limits. Cardiovascular: Normal rate and regular rhythm. no murmur or rubs Respiratory: Effort normal and breath sounds normal. No respiratory distress.  GI: Soft. Bowel sounds are normal. She exhibits no distension. No pain Neurological:  Patient is alert. Follows basic commands and answer simple questions.  B/l UE 4-/5 proximal distal.  RLE: Hip flexion , knee extension 2+/5, ankle dorsi/plantar flexion 4-/5.  LLE: Hip flexion 3-/5  Dysarthric speech Skin: BKA with staples, serous drainage over lateral aspects, and central area with blister.    Psych: pleasant, slow, flat, quiet  Assessment/Plan: 1. Functional deficits secondary to peripheral  vascular disease status post left BKA 10/09/2015 which require 3+ hours per day of interdisciplinary therapy in a comprehensive inpatient rehab setting. Physiatrist is providing close team supervision and 24 hour management of active medical problems listed below. Physiatrist and rehab team continue to assess barriers to discharge/monitor patient progress toward functional and medical goals.  Function:  Bathing Bathing position   Position: Shower  Bathing parts Body parts bathed by patient: Right arm, Left arm, Chest, Abdomen, Front perineal area, Buttocks, Right upper leg, Left upper leg, Right lower leg Body parts bathed by helper: Back  Bathing assist Assist Level: Touching or steadying assistance(Pt > 75%)      Upper Body Dressing/Undressing Upper body dressing   What is the patient wearing?: Pull over shirt/dress Bra - Perfomed by patient: Thread/unthread right bra strap, Thread/unthread left bra strap Bra - Perfomed by helper: Hook/unhook bra (pull down sports bra) Pull over shirt/dress - Perfomed by patient: Thread/unthread right sleeve, Thread/unthread left sleeve, Put head through opening, Pull shirt over trunk          Upper body assist Assist Level: Set up   Set up : To obtain clothing/put away  Lower Body Dressing/Undressing Lower body dressing   What is the patient wearing?: Underwear, Pants, Socks, Shoes Underwear - Performed by patient: Thread/unthread right underwear leg, Thread/unthread left underwear leg, Pull underwear up/down Underwear - Performed by helper: Thread/unthread left underwear leg Pants- Performed by patient: Thread/unthread right pants leg, Thread/unthread left pants leg, Pull pants up/down, Fasten/unfasten pants Pants- Performed by helper: Thread/unthread  left pants leg     Socks - Performed by patient: Don/doff right sock Socks - Performed by helper: Don/doff right sock Shoes - Performed by patient: Don/doff right shoe, Fasten right Shoes -  Performed by helper: Don/doff right shoe, Fasten right          Lower body assist Assist for lower body dressing: Touching or steadying assistance (Pt > 75%)      Toileting Toileting Toileting activity did not occur: No continent bowel/bladder event Toileting steps completed by patient: Adjust clothing prior to toileting, Performs perineal hygiene, Adjust clothing after toileting Toileting steps completed by helper: Adjust clothing prior to toileting, Performs perineal hygiene, Adjust clothing after toileting Toileting Assistive Devices: Grab bar or rail  Toileting assist Assist level: Touching or steadying assistance (Pt.75%)   Transfers Chair/bed transfer   Chair/bed transfer method: Stand pivot Chair/bed transfer assist level: Moderate assist (Pt 50 - 74%/lift or lower) Chair/bed transfer assistive device: Armrests     Locomotion Ambulation     Max distance: 8 ft (8 ft + 8 ft) Assist level: Touching or steadying assistance (Pt > 75%)   Wheelchair   Type: Manual Max wheelchair distance: 100 ft Assist Level: Supervision or verbal cues (2/2 left inattention)  Cognition Comprehension Comprehension assist level: Follows basic conversation/direction with no assist  Expression Expression assist level: Expresses basic needs/ideas: With no assist  Social Interaction Social Interaction assist level: Interacts appropriately with others - No medications needed.  Problem Solving Problem solving assist level: Solves basic 90% of the time/requires cueing < 10% of the time  Memory Memory assist level: Recognizes or recalls 90% of the time/requires cueing < 10% of the time     Medical Problem List and Plan: 1. Ischemic left lower extremity with gait and mobility secondary to peripheral vascular disease status post left BKA 10/09/2015  Continue CIR 2. DVT Prophylaxis/Anticoagulation: Chronic Coumadin for history of DVT. Monitor for any bleeding episodes  INR 1.69 on 2/15  Will d/c  Lovenox once Coumadin therapeutic 3. Pain Management: Presently on oxycodone. Monitor with increased mobility  Will attempt to wean for possible contribution to confusion 4. Multiple myeloma. Follow-up oncology service Dr. Julien Nordmann on plan of care for any further chemotherapy to be addressed as an outpatient 5. Neuropsych: This patient is capable of making decisions on her own behalf. 6. Skin/Wound Care: Routine skin checks 7. Fluids/Electrolytes/Nutrition: Routine I&O's with follow-up chemistries 8. Acute blood loss anemia.   Labs remain pending, spoke to nursing regarding need to labs 9. Mood/anxiety. Xanax 0.5 mg twice daily as needed. Provide emotional support 10. Diabetes mellitus and peripheral neuropathy. Hemoglobin A1c 6.6. Glucotrol XL 2.5 mg daily. Check blood sugars before meals and at bedtime. Diabetic teaching  Fasting CBG 127 this AM 11. Hypertension. Lisinopril 20 mg daily, HCTZ 12.5 mg daily, Norvasc 5 mg daily. Monitor with increased mobility  LOS (Days) 5 A FACE TO FACE EVALUATION WAS PERFORMED  Jencarlo Bonadonna Lorie Phenix 10/17/2015 7:41 AM

## 2015-10-17 NOTE — Progress Notes (Signed)
Social Work Patient ID: [name], female   DOB: 07-Apr-1936, 80 y.o.   MRN: [id number] Met with pt and spoke with Andrea-daughter regarding team conference goals-supervision-transfer's and min assist with ambulation. Target discharge date 2/18. Pt plans on going home and not to any alternative option for example NH or [facility]. Pt looks brighter and is doing well in her therapies, but still will need someone There at home for her safety and to make sure she is doing well at home. Daughter reports they were going to have a family meeting yesterday but one daughter got sick And it will be postponed until Friday. [name] is aware pt will need 24 hr supervision at discharge for safety. Discussed home health at discharge and that insurance would probably not Cover NHP due to pt's high level. Work on discharge plan for pt.

## 2015-10-17 NOTE — Plan of Care (Signed)
Problem: RH SKIN INTEGRITY Goal: RH STG ABLE TO PERFORM INCISION/WOUND CARE W/ASSISTANCE STG Able To Perform Incision/Wound Care With Assistance. Max A  Outcome: Not Progressing Total assist

## 2015-10-17 NOTE — Progress Notes (Signed)
Occupational Therapy Session Note  Patient Details  Name: [name] MRN: [id number] Date of Birth: 07-06-1936  Today's Date: 10/18/2015 OT Individual Time: 4097-3532 OT Individual Time Calculation (min): 75 min    Short Term Goals: Week 1:  OT Short Term Goal 1 (Week 1): Pt will perform toilet transfer with supervision  OT Short Term Goal 2 (Week 1): Pt will perform 3/3 toileting task using sit to stand for hygiene with steadying A OT Short Term Goal 3 (Week 1): Pt will perform 1 of 5 grooming task while standing with steadying A  Skilled Therapeutic Interventions/Progress Updates:  ADL-retraining at shower level with focus on discharge planning, dynamic sitting balance, safety awareness and DME retraining.   Pt received on toilet with RN present.   Pt completes all toilet hygiene unassisted and requests assist to shower chair to bathe.   Pt requires mod assist to rise for toilet safely and min guard for ambulation to shower chair.   Pt bathes well seated and request assist only to thoroughly scrub her back.   Pt dresses in w/c, standing supported using bed rails to pull up underwear and pants.   Pt grooms at sink unassisted.   Pt completed BADL in 55 minutes during this session.  With remaining time, OT escorted pt to tub room and demonstrated tub transfer using tub bench.   Pt ambulates from doorway to bench using RW and duplicates transfer with min vc to adjust position while seated on bench.   Pt returned to her room at end of session with all needs placed within reach.     Therapy Documentation Precautions:  Precautions Precautions: Fall Required Braces or Orthoses: Other Brace/Splint Other Brace/Splint: Limb protector LLE Restrictions Weight Bearing Restrictions: Yes LLE Weight Bearing: Non weight bearing Other Position/Activity Restrictions: Limb protector when out of bed  Vital Signs: Therapy Vitals Temp: 98.4 F (36.9 C) Temp Source: Oral Pulse Rate: 65 Resp: 18 BP:  (!) 146/55 mmHg Patient Position (if appropriate): Lying Oxygen Therapy SpO2: 98 % O2 Device: Not Delivered   Pain: Pain Assessment Pain Assessment: No/denies pain Pain Score: 4  Pain Type: Acute pain Pain Location: Leg Pain Orientation: Left Pain Descriptors / Indicators: Aching Pain Frequency: Constant Pain Onset: On-going Patients Stated Pain Goal: 3 Pain Intervention(s): Medication (See eMAR)  See Function Navigator for Current Functional Status.   Therapy/Group: Individual Therapy   Second session: Time: 1100-1155 Time Calculation (min): 55 min  Pain Assessment: No/denies pain  Skilled Therapeutic Interventions: Therapeutic activity with focus on sit<>stand, dynamic sitting and standing balance, and functional mobility using RW.   Pt received seated in recliner resting after physical therapy session.   After removing left residual limb protector, OT noted progression of possible blister/sore at left knee (anterior, 5 cm superior to patella) and alerted RN who applied padded dressing.  Pt was re-educated on design of foam contour for correct placement.   Pt requires setup to apply limb protector.   Pt was then escorted to [facility] clinic and performed 30 min of therapeutic activity using [employer] to challenge seated and standing balance, visual attention/scanning, and general endurance.   Pt stood during 2 session for 1:20 min while reaching to R/L upper quadrant, rings 1-3.   No LOB noted during session however pt requires min assist to manage RW and problem-solve when RW engages with w/c as obstacle.   Pt returned to recliner at end of session with all needs placed within reach.   See FIM for  current functional status  Therapy/Group: Individual Therapy  [name] 10/18/2015, 8:44 AM

## 2015-10-17 NOTE — Progress Notes (Signed)
ANTICOAGULATION CONSULT NOTE - Follow Up Consult  Pharmacy Consult for coumadin Indication: hx of DVT  No Known Allergies  Patient Measurements: Height: 5' (152.4 cm) Weight: 107 lb 5.8 oz (48.7 kg) IBW/kg (Calculated) : 45.5 Heparin Dosing Weight:   Vital Signs: Temp: 98.9 F (37.2 C) (02/15 0640) Temp Source: Oral (02/15 0640) BP: 132/61 mmHg (02/15 0640) Pulse Rate: 75 (02/15 0640)  Labs:  Recent Labs  10/15/15 0943 10/16/15 0604 10/17/15 0633  LABPROT 17.0* 18.3* 19.9*  INR 1.37 1.51* 1.69*    Estimated Creatinine Clearance: 42.3 mL/min (by C-G formula based on Cr of 0.75).   Medications:  Scheduled:  . acyclovir  400 mg Oral BID  . amLODipine  5 mg Oral Daily  . calcium-vitamin D  1 tablet Oral Daily  . clopidogrel  75 mg Oral Daily  . docusate sodium  100 mg Oral Daily  . enoxaparin  1.5 mg/kg Subcutaneous Q24H  . glipiZIDE  2.5 mg Oral Q breakfast  . hydrochlorothiazide  12.5 mg Oral Daily  . insulin aspart  0-9 Units Subcutaneous TID WC  . lisinopril  5 mg Oral Daily  . pantoprazole  40 mg Oral Daily  . potassium chloride SA  20 mEq Oral Daily  . Warfarin - Pharmacist Dosing Inpatient   Does not apply q1800   Infusions:    Assessment: 80 yo female with hx of DVT is currently on subtherapeutic coumadin.  INR today is up to 1.69 from 1.51.  Patient is also on lovenox treatment dose.  Goal of Therapy:  INR 2-3 Monitor platelets by anticoagulation protocol: Yes   Plan:  Coumadin 7.5 mg po x1  Lovenox '75mg'$  daily until INR in range; CBC tom  Kaelea Gathright, Tsz-Yin 10/17/2015,8:22 AM

## 2015-10-17 NOTE — Progress Notes (Signed)
Occupational Therapy Note  Patient Details  Name: [name] MRN: [id number] Date of Birth: 10/10/1935  Today's Date: 10/17/2015 OT Individual Time: 1330-1400 OT Individual Time Calculation (min): 30 min   Pt denied pain Individual therapy  Pt resting in recliner with son present.  Pt transferred to w/c and propelled to gym.  Pt engaged in dynamic sitting tasks and dynamic standing tasks.  Pt required min A for standing balance.  Pt required assistance with placing leg rests on w/c.  Pt returned to room and remained in w/c with QRB in place.  Focus on activity tolerance, functional transfers, sit<>stand, standing balance, and safety awareness to increase independence with BADLs.   Leotis Shames [facility] 10/17/2015, 2:25 PM

## 2015-10-18 ENCOUNTER — Inpatient Hospital Stay (HOSPITAL_COMMUNITY): Payer: Medicare Other | Admitting: Physical Therapy

## 2015-10-18 ENCOUNTER — Inpatient Hospital Stay (HOSPITAL_COMMUNITY): Payer: Medicare Other

## 2015-10-18 LAB — CBC WITH DIFFERENTIAL/PLATELET
BASOS PCT: 0 %
Basophils Absolute: 0 10*3/uL (ref 0.0–0.1)
EOS ABS: 0.1 10*3/uL (ref 0.0–0.7)
EOS PCT: 2 %
HCT: 29.5 % — ABNORMAL LOW (ref 36.0–46.0)
Hemoglobin: 9 g/dL — ABNORMAL LOW (ref 12.0–15.0)
LYMPHS ABS: 0.4 10*3/uL — AB (ref 0.7–4.0)
Lymphocytes Relative: 11 %
MCH: 28.8 pg (ref 26.0–34.0)
MCHC: 30.5 g/dL (ref 30.0–36.0)
MCV: 94.2 fL (ref 78.0–100.0)
Monocytes Absolute: 0.4 10*3/uL (ref 0.1–1.0)
Monocytes Relative: 10 %
Neutro Abs: 2.9 10*3/uL (ref 1.7–7.7)
Neutrophils Relative %: 77 %
PLATELETS: 379 10*3/uL (ref 150–400)
RBC: 3.13 MIL/uL — AB (ref 3.87–5.11)
RDW: 15.2 % (ref 11.5–15.5)
WBC: 3.8 10*3/uL — AB (ref 4.0–10.5)

## 2015-10-18 LAB — GLUCOSE, CAPILLARY
GLUCOSE-CAPILLARY: 128 mg/dL — AB (ref 65–99)
GLUCOSE-CAPILLARY: 106 mg/dL — AB (ref 65–99)
Glucose-Capillary: 106 mg/dL — ABNORMAL HIGH (ref 65–99)
Glucose-Capillary: 77 mg/dL (ref 65–99)
Glucose-Capillary: 104 mg/dL — ABNORMAL HIGH (ref 65–99)

## 2015-10-18 LAB — PROTIME-INR
INR: 1.79 — ABNORMAL HIGH (ref 0.00–1.49)
PROTHROMBIN TIME: 20.8 s — AB (ref 11.6–15.2)

## 2015-10-18 MED ORDER — WARFARIN SODIUM 7.5 MG PO TABS
7.5000 mg | ORAL_TABLET | Freq: Once | ORAL | Status: AC
  Administered 2015-10-18: 7.5 mg via ORAL
  Filled 2015-10-18: qty 1

## 2015-10-18 NOTE — Progress Notes (Signed)
ANTICOAGULATION CONSULT NOTE - Follow Up Consult  Pharmacy Consult for Coumadin Indication: DVT, history  No Known Allergies  Patient Measurements: Height: 5' (152.4 cm) Weight: 112 lb 14 oz (51.2 kg) IBW/kg (Calculated) : 45.5 Heparin Dosing Weight:   Vital Signs: Temp: 98.4 F (36.9 C) (02/16 0602) Temp Source: Oral (02/16 0602) BP: 146/55 mmHg (02/16 0602) Pulse Rate: 65 (02/16 0602)  Labs:  Recent Labs  10/16/15 0604 10/17/15 7867 10/17/15 1609 10/18/15 0612  HGB  --   --  9.7* 9.0*  HCT  --   --  30.1* 29.5*  PLT  --   --  390 379  LABPROT 18.3* 19.9*  --  20.8*  INR 1.51* 1.69*  --  1.79*  CREATININE  --   --  0.90  --     Estimated Creatinine Clearance: 37.6 mL/min (by C-G formula based on Cr of 0.9).   Medications:  Scheduled:  . acyclovir  400 mg Oral BID  . amLODipine  5 mg Oral Daily  . calcium-vitamin D  1 tablet Oral Daily  . clopidogrel  75 mg Oral Daily  . docusate sodium  100 mg Oral Daily  . enoxaparin  1.5 mg/kg Subcutaneous Q24H  . glipiZIDE  2.5 mg Oral Q breakfast  . hydrochlorothiazide  12.5 mg Oral Daily  . insulin aspart  0-9 Units Subcutaneous TID WC  . lisinopril  5 mg Oral Daily  . pantoprazole  40 mg Oral Daily  . potassium chloride SA  20 mEq Oral Daily  . Warfarin - Pharmacist Dosing Inpatient   Does not apply q1800    Assessment: 80yo female with history of DVT.  INR rising toward goal, Hg stable, and pltc wnl.  No bleeding noted.  Pt is also on Lovenox bridge 1.'5mg'$ /kg SQ q24.  Goal of Therapy:  INR 2-3 Monitor platelets by anticoagulation protocol: Yes   Plan:  Repeat Coumadin 7.'5mg'$  today Continue Lovenox bridge Watch for s/s of bleeding  Gracy Bruins, PharmD [facility]

## 2015-10-18 NOTE — Plan of Care (Signed)
Problem: RH KNOWLEDGE DEFICIT LIMB LOSS Goal: RH STG INCREASE KNOWLEDGE OF SELF CARE AFTER LIMB LOSS Outcome: Not Progressing Grasping information, but poor retention or demonstration

## 2015-10-18 NOTE — Progress Notes (Signed)
Physical Therapy Session Note  Patient Details  Name: [name] MRN: [id number] Date of Birth: 1935-12-15  Today's Date: 10/18/2015 PT Individual Time: 8250-0370 PT Individual Time Calculation (min): 63 min   Short Term Goals: Week 1:  PT Short Term Goal 1 (Week 1): Pt will perform bed mobility on flat bed with min A PT Short Term Goal 2 (Week 1): Pt will perform bed <> chair transfers with consistent min A PT Short Term Goal 3 (Week 1): Pt will perform car transfer with mod A PT Short Term Goal 4 (Week 1): Pt will perform ambulation with RW x 15' with mod A PT Short Term Goal 5 (Week 1): Pt will perform w/c mobility x 150' with supervision  Skilled Therapeutic Interventions/Progress Updates:    Pt received in w/c & agreeable to PT. Pt c/o 6/10 L residual limb pain; RN reported pt had already received pain medication. Pt requested to go to the bathroom & this PT provided close supervision while pt utilized grab bars to stand pivot to toilet; pt was able to doff pants in standing with close supervision.  Pt did require Min A to donn pants in standing and transfer toilet>w/c. No family present and pt unsure if home bed height had been adjusted or not.  Pt practiced w/c<>bed transfers at an elevated bed height.  Pt was prompted with questions regarding sequencing of transfer task & pt was able to state next task needed to be completed.  Pt did require extra time to problem solve & complete each task.  Pt able to transfer to/from elevated bed via stand pivot with CGA. Pt propelled w/c with BUE 100 ft to gym.  Pt educated on ambulating with RW with NWB LLE & utilizing BUE by depressing shoulders & extending UE's to allow RLE to advance. Pt was able to ambulate 7 ft + 10 ft + 20 ft with close supervision/CGA, with sitting rest breaks in between.  Pt required minimal verbal cuing for proper hand placement during transfers and mod cuing for sequencing during gait.  Pt began to utilize RLE more for  hopping when fatigued with task.  Pt reported more confidence in ambulation skills on this date.  Pt then assisted back to room & performed stand pivot transfer w/c>recliner with close S. Pt was left in recliner with BLE elevated, call bell within reach, and quick release belt in place.  Therapy Documentation Precautions:  Precautions Precautions: Fall Required Braces or Orthoses: Other Brace/Splint Other Brace/Splint: Limb protector LLE Restrictions Weight Bearing Restrictions: Yes LLE Weight Bearing: Non weight bearing Other Position/Activity Restrictions: Limb protector when out of bed  Vital Signs: Therapy Vitals BP: (!) 144/58 mmHg Pain: Pain Assessment Pain Assessment: No/denies pain Pain Score: 6  Pain Location:  (L residual limb) Pain Intervention(s):  (per RN pt had already received pain medication)  See Function Navigator for Current Functional Status.   Therapy/Group: Individual Therapy  Waunita Schooner, PT, DPT 10/18/2015, 12:49 PM

## 2015-10-18 NOTE — Progress Notes (Signed)
Daily dressing changed per order. Discolored area noted on patella, that was not evident on prior dressing change. Question if possible irritation from limb guard. Allevyn dressing place to area to decrease irritation.  R BKA with posterior mid to the incision was shiny and taunt on 2/13 assessment. Blister flat and dull  on 2/14. Today noted that blister was fluid filled, with irregular borders on 10/18/15. Daughter return demonstrated limb wrapping x 2 attempts. Stated that she would be "comfortable during wrapping at home."  Also educated regarding s/s of infection.

## 2015-10-18 NOTE — Progress Notes (Signed)
   VASCULAR SURGERY ASSESSMENT & PLAN:  * POD 9 S/P Left BKA  *  She tells me she may be going home Saturday. I will arrange staple removal as an outpt.   SUBJECTIVE: No complaints  PHYSICAL EXAM: Filed Vitals:   10/17/15 0640 10/17/15 1424 10/18/15 0602 10/18/15 0944  BP: 132/61 121/61 146/55 144/58  Pulse: 75 85 65   Temp: 98.9 F (37.2 C) 99.2 F (37.3 C) 98.4 F (36.9 C)   TempSrc: Oral Oral Oral   Resp:  18 18   Height:      Weight:  112 lb 14 oz (51.2 kg)    SpO2: 99% 100% 98%    Knee immobilizer in place.   LABS: Lab Results  Component Value Date   WBC 3.8* 10/18/2015   HGB 9.0* 10/18/2015   HCT 29.5* 10/18/2015   MCV 94.2 10/18/2015   PLT 379 10/18/2015   Lab Results  Component Value Date   CREATININE 0.90 10/17/2015   Lab Results  Component Value Date   INR 1.79* 10/18/2015   PROTIME 16.8* 09/04/2015   CBG (last 3)   Recent Labs  10/17/15 2054 10/18/15 0627 10/18/15 1155  GLUCAP 99 128* 106*    Principal Problem:   Status post below knee amputation of left lower extremity (HCC) Active Problems:   Multiple myeloma (HCC)   Essential hypertension   Gait instability   Chronic anticoagulation   History of DVT (deep vein thrombosis)   Acute blood loss anemia   Post-operative pain   Gae Gallop Beeper: [id number] 10/18/2015

## 2015-10-18 NOTE — Progress Notes (Signed)
Occupational Therapy Session Note  Patient Details  Name: [name] MRN: [id number] Date of Birth: 09-Jun-1936  Today's Date: 10/19/2015 OT Individual Time: 0930-1100 OT Individual Time Calculation (min): 90 min    Short Term Goals: Week 1:  OT Short Term Goal 1 (Week 1): Pt will perform toilet transfer with supervision  OT Short Term Goal 2 (Week 1): Pt will perform 3/3 toileting task using sit to stand for hygiene with steadying A OT Short Term Goal 3 (Week 1): Pt will perform 1 of 5 grooming task while standing with steadying A  Skilled Therapeutic Interventions/Progress Updates: ADL-retraining at shower level with focus on tub/shower transfer to bench, functional mobility using RW, improved safety awareness, dynamic standing balance and supervised homemaking (kitchen task: prepare cold pudding mix sitting and standing).    Pt received supine in bed with LLE dressing off and son [name] and daughter [name] present for session.   [name] slept in recliner during session and left after setup for bathing and [name] followed therapist to kitchen after pt bathed.   Pt gathered her clothing already setup by family as OT gathered supplies for BADL in tub room.   Pt escorted to bathroom doorway and then she ambulated from w/c to tub bench with supervision, completing transfer with supervision and bathing with assist only to scrub her back as she requested.   Pt able to wash periarea and buttocks using lateral leans.   Pt dressed seated at edge of bench, standing to pull up her underwear and pants with supervision (min cues to problem-solve and attend to positioning near w/c for safety).  Pt requires setup to don limb protector and to remove/replace foot rests and stump rest on w/c.   Pt was then escorted to kitchen and advised on kitchen task as simulating setup assist she provides her son [name] during his meal prep.   Pt requires close supervision during kitchen task d/t mild impulsive behavior and  inhibitions/cognitive delay limiting her requests for needed assistance with task (making pudding) as she encountered problems.   Pt did not read or follow written instructions as directed.   After sampling pudding, pt returned to her room with daughter present to supervise at end of session.     Therapy Documentation Precautions:  Precautions Precautions: Fall Required Braces or Orthoses: Other Brace/Splint Other Brace/Splint: Limb protector LLE Restrictions Weight Bearing Restrictions: Yes LLE Weight Bearing: Non weight bearing Other Position/Activity Restrictions: Limb protector when out of bed   Pain: No/denies pain  See Function Navigator for Current Functional Status.   Therapy/Group: Individual Therapy  [name] 10/19/2015, 2:28 PM

## 2015-10-18 NOTE — Progress Notes (Addendum)
[place] PHYSICAL MEDICINE & REHABILITATION     PROGRESS NOTE  Subjective/Complaints:  Pt seen participating with OT.  She continues to initially state the incorrect dates, but corrects herself.    ROS: Denies stump pain, CP, SOB, N/V/D  Objective: Vital Signs: Blood pressure 146/55, pulse 65, temperature 98.4 F (36.9 C), temperature source Oral, resp. rate 18, height 5' (1.524 m), weight 51.2 kg (112 lb 14 oz), SpO2 98 %. No results found.  Recent Labs  10/17/15 1609 10/18/15 0612  WBC 6.3 3.8*  HGB 9.7* 9.0*  HCT 30.1* 29.5*  PLT 390 379    Recent Labs  10/17/15 1609  NA 140  K 4.1  CL 105  GLUCOSE 91  BUN 10  CREATININE 0.90  CALCIUM 9.6   CBG (last 3)   Recent Labs  10/17/15 1630 10/17/15 2054 10/18/15 0627  GLUCAP 77 99 128*    Wt Readings from Last 3 Encounters:  10/17/15 51.2 kg (112 lb 14 oz)  10/09/15 49.6 kg (109 lb 5.6 oz)  09/26/15 50.803 kg (112 lb)    Physical Exam:  BP 146/55 mmHg  Pulse 65  Temp(Src) 98.4 F (36.9 C) (Oral)  Resp 18  Ht 5' (1.524 m)  Wt 51.2 kg (112 lb 14 oz)  BMI 22.04 kg/m2  SpO2 98% Gen: Frail appearing. No acute distress. Vital signs reviewed. HENT: Normocephalic, atraumatic.  Eyes: EOMI and and conjunctiva within normal limits. Cardiovascular: Normal rate and regular rhythm. No murmur or rubs Respiratory: Effort normal and breath sounds normal. No respiratory distress.  GI: Soft. Bowel sounds are normal. She exhibits no distension. No pain Neurological:  Patient is alert and oriented x3 with increased time. Follows basic commands and answers simple questions.  Expressive deficits B/l UE 4/5 proximal distal.  RLE: Hip flexion , knee extension 4+/5, ankle dorsi/plantar flexion 4/5.  LLE: Hip flexion 4-/5  Dysarthric speech Skin: BKA with staples, no drainage and central area with blister.    Psych: pleasant, slow, flat, quiet  Assessment/Plan: 1. Functional deficits secondary to peripheral  vascular disease status post left BKA 10/09/2015 which require 3+ hours per day of interdisciplinary therapy in a comprehensive inpatient rehab setting. Physiatrist is providing close team supervision and 24 hour management of active medical problems listed below. Physiatrist and rehab team continue to assess barriers to discharge/monitor patient progress toward functional and medical goals.  Function:  Bathing Bathing position   Position: Shower  Bathing parts Body parts bathed by patient: Right arm, Left arm, Chest, Abdomen, Front perineal area, Buttocks, Right upper leg, Left upper leg, Right lower leg Body parts bathed by helper: Back  Bathing assist Assist Level: Set up   Set up : To obtain items, To open containers, To adjust water temperature  Upper Body Dressing/Undressing Upper body dressing   What is the patient wearing?: Pull over shirt/dress, Bra Bra - Perfomed by patient: Thread/unthread right bra strap, Thread/unthread left bra strap, Hook/unhook bra (pull down sports bra) Bra - Perfomed by helper: Hook/unhook bra (pull down sports bra) Pull over shirt/dress - Perfomed by patient: Thread/unthread right sleeve, Thread/unthread left sleeve, Put head through opening, Pull shirt over trunk          Upper body assist Assist Level: Set up   Set up : To obtain clothing/put away  Lower Body Dressing/Undressing Lower body dressing   What is the patient wearing?: Underwear, Pants, Socks, Shoes Underwear - Performed by patient: Thread/unthread right underwear leg, Thread/unthread left underwear leg, Pull  underwear up/down Underwear - Performed by helper: Thread/unthread left underwear leg Pants- Performed by patient: Thread/unthread right pants leg, Thread/unthread left pants leg, Pull pants up/down, Fasten/unfasten pants Pants- Performed by helper: Thread/unthread left pants leg     Socks - Performed by patient: Don/doff right sock Socks - Performed by helper: Don/doff right  sock Shoes - Performed by patient: Don/doff right shoe, Fasten right Shoes - Performed by helper: Don/doff right shoe, Fasten right          Lower body assist Assist for lower body dressing: Touching or steadying assistance (Pt > 75%)      Toileting Toileting Toileting activity did not occur: No continent bowel/bladder event Toileting steps completed by patient: Adjust clothing prior to toileting, Performs perineal hygiene, Adjust clothing after toileting Toileting steps completed by helper: Adjust clothing prior to toileting, Performs perineal hygiene, Adjust clothing after toileting Toileting Assistive Devices: Grab bar or rail  Toileting assist Assist level: Touching or steadying assistance (Pt.75%)   Transfers Chair/bed transfer   Chair/bed transfer method: Stand pivot Chair/bed transfer assist level: Moderate assist (Pt 50 - 74%/lift or lower) Chair/bed transfer assistive device: Armrests     Locomotion Ambulation     Max distance: 8 ft (8 ft + 8 ft) Assist level: Touching or steadying assistance (Pt > 75%)   Wheelchair   Type: Manual Max wheelchair distance: 150 Assist Level: Supervision or verbal cues  Cognition Comprehension Comprehension assist level: Understands complex 90% of the time/cues 10% of the time  Expression Expression assist level: Expresses basic needs/ideas: With extra time/assistive device  Social Interaction Social Interaction assist level: Interacts appropriately 90% of the time - Needs monitoring or encouragement for participation or interaction.  Problem Solving Problem solving assist level: Solves basic 90% of the time/requires cueing < 10% of the time  Memory Memory assist level: Recognizes or recalls 90% of the time/requires cueing < 10% of the time     Medical Problem List and Plan: 1. Ischemic left lower extremity with gait and mobility secondary to peripheral vascular disease status post left BKA 10/09/2015  Continue CIR   Will consider  some shrinker as outpt 2. DVT Prophylaxis/Anticoagulation: Chronic Coumadin for history of DVT. Monitor for any bleeding episodes  INR 1.79 on 2/16, will speak to pharmacy and PCP regarding more aggressive therapy  Will d/c Lovenox once Coumadin therapeutic 3. Pain Management: Presently on oxycodone. Monitor with increased mobility  Will attempt to wean for possible contribution to confusion 4. Multiple myeloma. Follow-up oncology service Dr. Julien Nordmann on plan of care for any further chemotherapy to be addressed as an outpatient 5. Neuropsych: This patient is capable of making decisions on her own behalf. 6. Skin/Wound Care: Routine skin checks 7. Fluids/Electrolytes/Nutrition: Routine I&O's with follow-up chemistries 8. Acute blood loss anemia.   Hb 9.0 on 2/16 9. Mood/anxiety. Xanax 0.5 mg twice daily as needed. Provide emotional support 10. Diabetes mellitus and peripheral neuropathy. Hemoglobin A1c 6.6. Glucotrol XL 2.5 mg daily. Check blood sugars before meals and at bedtime. Diabetic teaching  Fasting CBG 128 this AM 11. Hypertension. Lisinopril 20 mg daily, HCTZ 12.5 mg daily, Norvasc 5 mg daily. Monitor with increased mobility  LOS (Days) 6 A FACE TO FACE EVALUATION WAS PERFORMED  Ankit Lorie Phenix 10/18/2015 8:08 AM

## 2015-10-19 ENCOUNTER — Inpatient Hospital Stay (HOSPITAL_COMMUNITY): Payer: Medicare Other | Admitting: Occupational Therapy

## 2015-10-19 ENCOUNTER — Inpatient Hospital Stay (HOSPITAL_COMMUNITY): Payer: Medicare Other | Admitting: Physical Therapy

## 2015-10-19 ENCOUNTER — Inpatient Hospital Stay (HOSPITAL_COMMUNITY): Payer: Medicare Other

## 2015-10-19 LAB — GLUCOSE, CAPILLARY
GLUCOSE-CAPILLARY: 126 mg/dL — AB (ref 65–99)
GLUCOSE-CAPILLARY: 114 mg/dL — AB (ref 65–99)
GLUCOSE-CAPILLARY: 86 mg/dL (ref 65–99)
Glucose-Capillary: 139 mg/dL — ABNORMAL HIGH (ref 65–99)

## 2015-10-19 LAB — PROTIME-INR
INR: 2.13 — ABNORMAL HIGH (ref 0.00–1.49)
Prothrombin Time: 23.7 seconds — ABNORMAL HIGH (ref 11.6–15.2)

## 2015-10-19 MED ORDER — ACYCLOVIR 400 MG PO TABS: 400.0000 mg | ORAL_TABLET | Freq: Two times a day (BID) | ORAL | Status: DC

## 2015-10-19 MED ORDER — WARFARIN SODIUM 5 MG PO TABS
5.0000 mg | ORAL_TABLET | Freq: Once | ORAL | Status: AC
  Administered 2015-10-19: 5 mg via ORAL
  Filled 2015-10-19: qty 1

## 2015-10-19 MED ORDER — ALPRAZOLAM 0.5 MG PO TABS: 0.5000 mg | ORAL_TABLET | Freq: Two times a day (BID) | ORAL | Status: DC | PRN

## 2015-10-19 MED ORDER — TRAMADOL HCL 50 MG PO TABS
50.0000 mg | ORAL_TABLET | ORAL | Status: DC | PRN
  Administered 2015-10-19 – 2015-10-22 (×9): 50 mg via ORAL
  Filled 2015-10-19 (×9): qty 1

## 2015-10-19 MED ORDER — LISINOPRIL 5 MG PO TABS: 5.0000 mg | ORAL_TABLET | Freq: Every day | ORAL | Status: DC

## 2015-10-19 MED ORDER — HYDROCHLOROTHIAZIDE 12.5 MG PO CAPS: 12.5000 mg | ORAL_CAPSULE | Freq: Every day | ORAL | Status: DC

## 2015-10-19 MED ORDER — TRAMADOL HCL 50 MG PO TABS: 50.0000 mg | ORAL_TABLET | ORAL | Status: DC | PRN

## 2015-10-19 MED ORDER — CALCIUM CARBONATE-VITAMIN D 600-400 MG-UNIT PO TABS: 1.0000 | ORAL_TABLET | Freq: Every day | ORAL | Status: DC

## 2015-10-19 MED ORDER — PANTOPRAZOLE SODIUM 40 MG PO TBEC: 40.0000 mg | DELAYED_RELEASE_TABLET | Freq: Every day | ORAL | Status: DC

## 2015-10-19 MED ORDER — WARFARIN SODIUM 5 MG PO TABS: 5.0000 mg | ORAL_TABLET | Freq: Once | ORAL | Status: DC

## 2015-10-19 MED ORDER — AMLODIPINE BESYLATE 5 MG PO TABS: 5.0000 mg | ORAL_TABLET | Freq: Every day | ORAL | Status: DC

## 2015-10-19 MED ORDER — CLOPIDOGREL BISULFATE 75 MG PO TABS: 75.0000 mg | ORAL_TABLET | Freq: Every day | ORAL | Status: DC

## 2015-10-19 MED ORDER — POTASSIUM CHLORIDE CRYS ER 20 MEQ PO TBCR: 20.0000 meq | EXTENDED_RELEASE_TABLET | Freq: Every day | ORAL | Status: DC

## 2015-10-19 MED ORDER — GLIPIZIDE ER 2.5 MG PO TB24: 2.5000 mg | ORAL_TABLET | Freq: Every day | ORAL | Status: DC

## 2015-10-19 NOTE — Discharge Summary (Signed)
[name], [name] NO.:  [id number]  MEDICAL RECORD NO.:  [id number]  LOCATION:  4M01C                        FACILITY:  [facility]  PHYSICIAN:  Delice Lesch, MD        DATE OF BIRTH:  May 18, 1936  DATE OF ADMISSION:  10/11/2015 DATE OF DISCHARGE:  10/22/2015                              DISCHARGE SUMMARY   DISCHARGE DIAGNOSES: 1. Ischemic left lower extremity with gait and mobility deficits     secondary to left below-knee amputation (BKA). 2. Chronic Coumadin for history of DVT. 3. Pain management. 4. Multiple myeloma. 5. Acute blood loss anemia. 6. Diabetes mellitus with peripheral neuropathy. 7. Anxiety. 8. Hypertension.  HISTORY OF PRESENT ILLNESS:  This is a 80 year old, right-handed female with history of multiple myeloma, followed by Dr. Inda Merlin, diabetes mellitus, peripheral neuropathy, and bilateral lower extremity DVTs, on chronic Coumadin.  She lives with son.  She used a walker prior to admission.  She presented on October 09, 2015 with gangrenous left foot. No options for revascularization.  She underwent left below-knee amputation on October 09, 2015 per Dr. Scot Dock.  Hospital course, pain management, acute blood loss anemia 8.9 and monitored, chronic Coumadin, resumed, physical and occupational therapy ongoing, and the patient was admitted for comprehensive rehab program.  PAST MEDICAL HISTORY:  See discharge diagnoses.  SOCIAL HISTORY:  Lives with son, used a walker prior to admission. Functional status upon admission to rehab services was +2 physical assist stand pivot transfers, moderate assist supine to sit, min mod assist activities of daily living.  PHYSICAL EXAMINATION:  VITAL SIGNS:  Blood pressure 116/57, pulse 77, temperature 98, respirations 18. GENERAL:  This was an alert female, in no acute distress, oriented and appropriate. LUNGS:  Clear to auscultation without wheeze. CARDIAC:  Regular rate and rhythm.  No  murmur. ABDOMEN:  Soft, nontender.  Good bowel sounds. EXTREMITIES:  Amputation site dressed appropriately, tender, with central blister.  REHABILITATION HOSPITAL COURSE:  The patient was admitted to inpatient rehab services with therapies initiated on a 3-hour daily basis, consisting of physical therapy, occupational therapy, and rehabilitation nursing.  The following issues were addressed during the patient's rehabilitation stay.  Pertaining to the patient's left below-knee amputation on October 09, 2015, surgical site healing nicely.  She would follow up with Dr. Scot Dock, of Vascular Surgery as well as [facility].  She remained on chronic Coumadin for history of DVT latest INR of 1.79 adjusted accordingly.  She had been on subcutaneous Lovenox to INR greater than 2.00.  She would follow up with Dr. [name] of [place], [place], [phone] for ongoing management the home health nurse had been provided.  Blood pressures remained well controlled and monitored.  No orthostatic changes.  Blood sugars maintained on Glucotrol and full diabetic teaching.  Acute blood loss anemia 9.0, she remained asymptomatic.  The patient received weekly collaborative interdisciplinary team conferences to discuss estimated length of stay, family teaching, and any barriers to discharge.  Patient practice wheelchair to bed transfers elevated bed height.  She did require some extra time to problem solve each tasks.  Propelled her wheelchair 100 feet supervision.  Ambulated 20 feet, close  supervision contact guard.  She could gather her belongings for activities of daily living and homemaking.  Completed all toilet hygiene unassisted.  She bathes in a seated position.  Dresses and wheelchair standing support using bed rails. Family did not feel they could provide the necessary assistance at home with recommendations for skilled nursing facility in bed becoming available 10/22/2015 and  discharge taking place.  DISCHARGE MEDICATIONS:  Zovirax 400 mg p.o. b.i.d., Xanax 0.5 mg p.o. b.i.d. as needed, Norvasc 5 mg p.o. daily, vitamin D 1 tab daily, Plavix 75 mg p.o. daily, Colace 100 mg p.o. daily, Glucotrol XL 2.5 mg p.o. daily, hydrochlorothiazide 12.5 mg p.o. daily, lisinopril 5 mg p.o. daily, Ultram 50 mg every 4 hours as needed.  Protonix 40 mg p.o. daily, potassium chloride 20 mEq p.o. daily, Coumadin 2.5 mg daily adjusted accordingly for INR of 2.0-3.0.  DIET:  Diabetic diet.  The patient will continue with 4x4s, Kerlix and a 4 inch Ace wrap daily to stump site.  Patient will call Dr. [name] [phone] medical management.  The patient would follow up with Dr. Delice Lesch as directed; Dr. Joylene Igo, Vascular Surgery in 2 weeks, call for appointment. Follow-up with oncology service Dr. Earlie Server for future plans of chemotherapy related to her multiple myeloma     Lauraine Rinne, P.A.   ______________________________ Delice Lesch, MD    DA/MEDQ  D:  10/19/2015  T:  10/19/2015  Job:  [id number]  cc:   Elveria Rising. [name], M.D. Judeth Cornfield. Scot Dock, M.D.

## 2015-10-19 NOTE — Progress Notes (Signed)
Occupational Therapy Session Note  Patient Details  Name: MARGENE CHERIAN MRN: [id number] Date of Birth: 1936-06-23  Today's Date: 10/19/2015 OT Individual Time: 1300-1346 OT Individual Time Calculation (min): 46 min    Skilled Therapeutic Interventions/Progress Updates:    Pt worked on sit to stand and standing balance using RW.  She needs mod instructional cueing for sequencing setup of wheelchair as she attempted to get up initially without removing wheelchair foot rest and amputee pad.  Mod cueing also needed for hand placement during sit to stand as she attempts to pull up on the walker instead.  In standing had pt working on card matching task.  She needed max questioning cueing to match up playing cards in standing and place on corresponding match with alternating UEs.  She tolerated standing for 4-5 mins with first attempt and progressed to begin only able to tolerate approximately 2-3 mins secondary to fatigue.  Noted pt at times demonstrating decreased ability to follow instructional commands.  When told to hop backwards toward the mat to sit and rest, she instead hopped forward and then to the side.  After completion of multiple intervals with rest breaks, she transferred back to the wheelchair and was returned to the room with daughter beside of her.  Safety belt also in place.   Therapy Documentation Precautions:  Precautions Precautions: Fall Required Braces or Orthoses: Other Brace/Splint Other Brace/Splint: Limb protector LLE Restrictions Weight Bearing Restrictions: Yes LLE Weight Bearing: Non weight bearing Other Position/Activity Restrictions: Limb protector when out of bed  Pain: Pain Assessment Pain Assessment: No/denies pain ADL: See Function Navigator for Current Functional Status.   Therapy/Group: Individual Therapy  Chesney Klimaszewski OTR/L 10/19/2015, 4:23 PM

## 2015-10-19 NOTE — Plan of Care (Signed)
Problem: RH SAFETY Goal: RH STG ADHERE TO SAFETY PRECAUTIONS W/ASSISTANCE/DEVICE STG Adhere to Safety Precautions With Assistance/Device. Supervision  Outcome: Not Progressing impulsive

## 2015-10-19 NOTE — Discharge Instructions (Signed)
Inpatient Rehab Discharge Instructions  [name] Discharge date and time: No discharge date for patient encounter.   Activities/Precautions/ Functional Status: Activity: activity as tolerated Diet: diabetic diet Wound Care: keep wound clean and dry Functional status:  ___ No restrictions     ___ Walk up steps independently ___ 24/7 supervision/assistance   ___ Walk up steps with assistance ___ Intermittent supervision/assistance  ___ Bathe/dress independently ___ Walk with walker     _x__ Bathe/dress with assistance ___ Walk Independently    ___ Shower independently ___ Walk with assistance    ___ Shower with assistance ___ No alcohol     ___ Return to work/school ________  Special Instructions: Home health nurse to check INR on 10/23/2015 results to Dr. [name] [phone] fax number [phone]   COMMUNITY REFERRALS UPON DISCHARGE:    Home Health:   PT, OT, RN, [place], [place]   Date of last service:10/20/2015  Medical Equipment/Items Ordered:AMUPTEE PAD [name]    [phone] OTHER: PACE PROGRAM PT REFUSED TO SIGN PAPERS TO [place] PATIENT/FAMILY: Support Groups:[employer] SECOND Tuesday EACH MONTH @ [place] 7:00-8:30 PM QUESTIONS CONTACT: Jamey Reas [phone]   My questions have been answered and I understand these instructions. I will adhere to these goals and the provided educational materials after my discharge from the hospital.  Patient/Caregiver Signature _______________________________ Date __________  Clinician Signature _______________________________________ Date __________  Please bring this form and your medication list with you to all your follow-up doctor's appointments.

## 2015-10-19 NOTE — Progress Notes (Addendum)
[facility] PHYSICAL MEDICINE & REHABILITATION     PROGRESS NOTE  Subjective/Complaints:  Patient seen resting in bed this morning. She inquires if there is drainage from her stump site, which there is not.    ROS: Denies stump pain, CP, SOB, N/V/D  Objective: Vital Signs: Blood pressure 132/62, pulse 77, temperature 98.6 F (37 C), temperature source Oral, resp. rate 18, height 5' (1.524 m), weight 51.2 kg (112 lb 14 oz), SpO2 100 %. No results found.  Recent Labs  10/17/15 1609 10/18/15 0612  WBC 6.3 3.8*  HGB 9.7* 9.0*  HCT 30.1* 29.5*  PLT 390 379    Recent Labs  10/17/15 1609  NA 140  K 4.1  CL 105  GLUCOSE 91  BUN 10  CREATININE 0.90  CALCIUM 9.6   CBG (last 3)   Recent Labs  10/18/15 2046 10/18/15 2158 10/19/15 0656  GLUCAP 77 104* 126*    Wt Readings from Last 3 Encounters:  10/17/15 51.2 kg (112 lb 14 oz)  10/09/15 49.6 kg (109 lb 5.6 oz)  09/26/15 50.803 kg (112 lb)    Physical Exam:  BP 132/62 mmHg  Pulse 77  Temp(Src) 98.6 F (37 C) (Oral)  Resp 18  Ht 5' (1.524 m)  Wt 51.2 kg (112 lb 14 oz)  BMI 22.04 kg/m2  SpO2 100% Gen: Frail appearing. No acute distress. Vital signs reviewed. HENT: Normocephalic, atraumatic.  Eyes: EOMI and and conjunctiva within normal limits. Cardiovascular: Normal rate and regular rhythm. No murmur or rubs Respiratory: Effort normal and breath sounds normal. No respiratory distress.  GI: Soft. Bowel sounds are normal. She exhibits no distension. No pain Neurological:  Patient is alert and oriented x3 with increased time. Follows basic commands and answers simple questions.  Expressive deficits B/l UE 4/5 proximal distal.  RLE: Hip flexion , knee extension 4+/5, ankle dorsi/plantar flexion 4/5.  LLE: Hip flexion 4-/5  Dysarthric speech Skin: BKA with staples, no drainage and central area with blister (stable).    Psych: pleasant, slow, flat, quiet  Assessment/Plan: 1. Functional deficits secondary to  peripheral vascular disease status post left BKA 10/09/2015 which require 3+ hours per day of interdisciplinary therapy in a comprehensive inpatient rehab setting. Physiatrist is providing close team supervision and 24 hour management of active medical problems listed below. Physiatrist and rehab team continue to assess barriers to discharge/monitor patient progress toward functional and medical goals.  Function:  Bathing Bathing position   Position: Shower  Bathing parts Body parts bathed by patient: Right arm, Left arm, Chest, Abdomen, Front perineal area, Buttocks, Right upper leg, Left upper leg, Right lower leg Body parts bathed by helper: Back  Bathing assist Assist Level: Touching or steadying assistance(Pt > 75%)   Set up : To obtain items, To open containers, To adjust water temperature  Upper Body Dressing/Undressing Upper body dressing   What is the patient wearing?: Bra, Pull over shirt/dress Bra - Perfomed by patient: Thread/unthread right bra strap, Thread/unthread left bra strap, Hook/unhook bra (pull down sports bra) Bra - Perfomed by helper: Hook/unhook bra (pull down sports bra) Pull over shirt/dress - Perfomed by patient: Thread/unthread right sleeve, Thread/unthread left sleeve, Put head through opening, Pull shirt over trunk          Upper body assist Assist Level: Set up   Set up : To obtain clothing/put away  Lower Body Dressing/Undressing Lower body dressing   What is the patient wearing?: Underwear, Pants, Socks, Shoes Underwear - Performed by patient:  Thread/unthread right underwear leg, Thread/unthread left underwear leg, Pull underwear up/down Underwear - Performed by helper: Thread/unthread left underwear leg Pants- Performed by patient: Thread/unthread right pants leg, Thread/unthread left pants leg, Pull pants up/down, Fasten/unfasten pants Pants- Performed by helper: Thread/unthread left pants leg     Socks - Performed by patient: Don/doff right  sock Socks - Performed by helper: Don/doff right sock Shoes - Performed by patient: Don/doff right shoe, Fasten right Shoes - Performed by helper: Don/doff right shoe, Fasten right          Lower body assist Assist for lower body dressing: Set up   Set up : To obtain clothing/put away  Toileting Toileting Toileting activity did not occur: No continent bowel/bladder event Toileting steps completed by patient: Adjust clothing prior to toileting, Performs perineal hygiene, Adjust clothing after toileting Toileting steps completed by helper: Adjust clothing prior to toileting, Performs perineal hygiene, Adjust clothing after toileting Toileting Assistive Devices: Grab bar or rail  Toileting assist Assist level: Supervision or verbal cues   Transfers Chair/bed transfer   Chair/bed transfer method: Stand pivot Chair/bed transfer assist level: Supervision or verbal cues Chair/bed transfer assistive device: Armrests     Locomotion Ambulation     Max distance: 20 ft Assist level: Touching or steadying assistance (Pt > 75%)   Wheelchair   Type: Manual Max wheelchair distance: 150 Assist Level: Supervision or verbal cues  Cognition Comprehension Comprehension assist level: Understands complex 90% of the time/cues 10% of the time  Expression Expression assist level: Expresses basic needs/ideas: With no assist  Social Interaction Social Interaction assist level: Interacts appropriately 90% of the time - Needs monitoring or encouragement for participation or interaction.  Problem Solving Problem solving assist level: Solves basic problems with no assist  Memory Memory assist level: Recognizes or recalls 90% of the time/requires cueing < 10% of the time     Medical Problem List and Plan: 1. Ischemic left lower extremity with gait and mobility secondary to peripheral vascular disease status post left BKA 10/09/2015  Continue CIR   Will consider some shrinker as outpt 2. DVT  Prophylaxis/Anticoagulation: Chronic Coumadin for history of DVT. Monitor for any bleeding episodes  INR 2.13 on 2/17, therapeutic today  Will d/c Lovenox tomorrow if INR continues to be therapeutic 3. Pain Management: Presently on oxycodone. Monitor with increased mobility  Cont to wean  4. Multiple myeloma. Follow-up oncology service Dr. Julien Nordmann on plan of care for any further chemotherapy to be addressed as an outpatient 5. Neuropsych: This patient is capable of making decisions on her own behalf. 6. Skin/Wound Care: Routine skin checks 7. Fluids/Electrolytes/Nutrition: Routine I&O's with follow-up chemistries 8. Acute blood loss anemia.   Hb 9.0 on 2/16 9. Mood/anxiety. Xanax 0.5 mg twice daily as needed. Provide emotional support 10. Diabetes mellitus and peripheral neuropathy. Hemoglobin A1c 6.6. Glucotrol XL 2.5 mg daily. Check blood sugars before meals and at bedtime. Diabetic teaching  Fasting CBG 126 this AM 11. Hypertension. Lisinopril 20 mg daily, HCTZ 12.5 mg daily, Norvasc 5 mg daily. Monitor with increased mobility  LOS (Days) 7 A FACE TO FACE EVALUATION WAS PERFORMED  Ankit Lorie Phenix 10/19/2015 7:44 AM

## 2015-10-19 NOTE — Progress Notes (Signed)
Social Work Patient ID: [name], female   DOB: [birth year], 80 y.o.   MRN: [id number] Received yesterday evening form Andrea-daughter how reports pt need to go to a NH from rehab. Called daughter back and left message that unsure if pt's insurance will cover and pt would need to agree to this plan. Spoke with pt this am and she is not aware of any of these plans and was planning to go home. Have attempted to contact [name] again and leave another message for her to contact this worker regarding the discharge disposition Since pt discharge date is set for tomorrow. Will try to confirm the discharge plan this am.

## 2015-10-19 NOTE — Plan of Care (Signed)
Problem: RH Ambulation Goal: LTG Patient will ambulate in home environment (PT) LTG: Patient will ambulate in home environment, # of feet with assistance (PT).  Outcome: Not Applicable Date Met:  41/28/20 D/C goal due to SNF placement  Problem: RH Wheelchair Mobility Goal: LTG Patient will propel w/c in home environment (PT) LTG: Patient will propel wheelchair in home environment, # of feet with assistance (PT).  Outcome: Not Applicable Date Met:  81/38/87 D/C 2/2 SNF placement

## 2015-10-19 NOTE — Progress Notes (Signed)
Social Work Patient ID: [name], female   DOB: 1935/10/28, 80 y.o.   MRN: [id number] Did speak with [facility] case manager regarding coverage for NHP and she reported to find NH bed and then have them submit the paperwork to see if [facility] would cover her for this. So will await family's decision and then have provide paperwork for insurance coverage.

## 2015-10-19 NOTE — Discharge Summary (Signed)
Discharge summary job [phone]

## 2015-10-19 NOTE — Progress Notes (Signed)
Social Work Patient ID: [name], female   DOB: [birth year], 80 y.o.   MRN: [id number] Pt is a active pt with [facility] for HHPT, OT, SW and aide will resume these services and add RN to that order. Have contacted insurance and awaiting return call regarding coverage for NH. Have yet to hear from [place].

## 2015-10-19 NOTE — NC FL2 (Signed)
[place] LEVEL OF CARE SCREENING TOOL     IDENTIFICATION  Patient Name: [name] Birthdate: [birth year] Sex: female Admission Date (Current Location): 10/12/2015  [facility] and [place] Number:  Herbalist and Address:  [facility], [place] [address] [place], [place] [address]      Provider Number: [id number]  Attending Physician Name and Address:  Ankit Lorie Phenix, MD  Relative Name and Phone Number:  Kayli Beal  [phone]    Current Level of Care: Hospital Recommended Level of Care: [place] Prior Approval Number:    Date Approved/Denied:   PASRR Number: [id number]  Discharge Plan: SNF    Current Diagnoses: Patient Active Problem List   Diagnosis Date Noted  . Chronic anticoagulation   . History of DVT (deep vein thrombosis)   . Acute blood loss anemia   . Post-operative pain   . Status post below knee amputation of left lower extremity ([place]) 10/12/2015  . PAD (peripheral artery disease) ([place]) 10/09/2015  . Dry gangrene ([place]) 09/13/2015  . Cellulitis 09/09/2015  . PVD (peripheral vascular disease) ([place]) 09/09/2015  . Encounter for antineoplastic chemotherapy 06/26/2015  . Chronic pain 02/07/2015  . Hypokalemia 11/28/2014  . Long term current use of anticoagulant therapy 11/28/2014  . Fissure in skin of foot 11/28/2014  . Sinus bradycardia 09/08/2014  . DNR no code (do not resuscitate) 09/08/2013  . Chronic steroid use 09/08/2013  . Deep vein thrombosis of left lower extremity ([place]) 05/12/2013  . Deep vein thrombosis of right lower extremity ([place]) 05/12/2013  . Left hip pain 05/16/2012  . Gait instability 05/08/2011  . Multiple myeloma ([place]) 03/29/2010  . Diabetes mellitus without complication ([place]) 09/73/5329  . DEPRESSION, MILD 03/29/2010  . LEG PAIN, RIGHT 03/29/2010  . Hyperlipidemia 05/19/2008  . Essential hypertension 05/19/2008  . Osteoarthritis 05/19/2008    Orientation  RESPIRATION BLADDER Height & Weight     Situation, Place, Self  Normal Continent Weight: 112 lb 14 oz (51.2 kg) Height:  5' (152.4 cm)  BEHAVIORAL SYMPTOMS/MOOD NEUROLOGICAL BOWEL NUTRITION STATUS      Continent Diet  AMBULATORY STATUS COMMUNICATION OF NEEDS Skin   Limited Assist Verbally Surgical wounds                       Personal Care Assistance Level of Assistance  Dressing, Bathing Bathing Assistance: Limited assistance   Dressing Assistance: Limited assistance     Functional Limitations Info  Sight Sight Info: Impaired        SPECIAL CARE FACTORS FREQUENCY  PT (By licensed PT), OT (By licensed OT)     PT Frequency: 5 WK OT Frequency: 5 WK            Contractures Contractures Info: Not present    Additional Factors Info  Code Status, Allergies, Insulin Sliding Scale Code Status Info: FULL Allergies Info: NKA   Insulin Sliding Scale Info: 3/DAY       Current Medications (10/19/2015):  This is the current hospital active medication list Current Facility-Administered Medications  Medication Dose Route Frequency Provider Last Rate Last Dose  . acetaminophen (TYLENOL) tablet 325-650 mg  325-650 mg Oral Q4H PRN Lavon Paganini Angiulli, PA-C   650 mg at 10/18/15 0335  . acyclovir (ZOVIRAX) tablet 400 mg  400 mg Oral BID Lavon Paganini Angiulli, PA-C   400 mg at 10/19/15 0900  . ALPRAZolam (XANAX) tablet 0.5 mg  0.5 mg Oral BID PRN  Lavon Paganini Angiulli, PA-C   0.5 mg at 10/14/15 2356  . amLODipine (NORVASC) tablet 5 mg  5 mg Oral Daily Lavon Paganini Angiulli, PA-C   5 mg at 10/19/15 0859  . calcium-vitamin D (OSCAL WITH D) 500-200 MG-UNIT per tablet 1 tablet  1 tablet Oral Daily Lavon Paganini Angiulli, PA-C   1 tablet at 10/19/15 0900  . clopidogrel (PLAVIX) tablet 75 mg  75 mg Oral Daily Lavon Paganini Angiulli, PA-C   75 mg at 10/19/15 0900  . docusate sodium (COLACE) capsule 100 mg  100 mg Oral Daily Lavon Paganini Angiulli, PA-C   100 mg at 10/19/15 0859  . enoxaparin (LOVENOX) injection 75  mg  1.5 mg/kg Subcutaneous Q24H Lavon Paganini Angiulli, PA-C   75 mg at 10/18/15 0947  . glipiZIDE (GLUCOTROL XL) 24 hr tablet 2.5 mg  2.5 mg Oral Q breakfast Lavon Paganini Angiulli, PA-C   2.5 mg at 10/19/15 0900  . hydrochlorothiazide (MICROZIDE) capsule 12.5 mg  12.5 mg Oral Daily Ankit Lorie Phenix, MD   12.5 mg at 10/19/15 0900  . insulin aspart (novoLOG) injection 0-9 Units  0-9 Units Subcutaneous TID WC Lavon Paganini Angiulli, PA-C   1 Units at 10/19/15 0900  . lisinopril (PRINIVIL,ZESTRIL) tablet 5 mg  5 mg Oral Daily Ankit Lorie Phenix, MD   5 mg at 10/19/15 0900  . ondansetron (ZOFRAN) tablet 4 mg  4 mg Oral Q6H PRN Lavon Paganini Angiulli, PA-C   4 mg at 10/13/15 1656   Or  . ondansetron (ZOFRAN) injection 4 mg  4 mg Intravenous Q6H PRN Lavon Paganini Angiulli, PA-C      . pantoprazole (PROTONIX) EC tablet 40 mg  40 mg Oral Daily Daniel J Angiulli, PA-C   40 mg at 10/19/15 0900  . potassium chloride SA (K-DUR,KLOR-CON) CR tablet 20 mEq  20 mEq Oral Daily Lavon Paganini Angiulli, PA-C   20 mEq at 10/19/15 0859  . sorbitol 70 % solution 30 mL  30 mL Oral Daily PRN Lavon Paganini Angiulli, PA-C      . temazepam (RESTORIL) capsule 30 mg  30 mg Oral QHS PRN Lavon Paganini Angiulli, PA-C   30 mg at 10/14/15 1946  . traMADol (ULTRAM) tablet 50 mg  50 mg Oral Q4H PRN Ankit Lorie Phenix, MD      . warfarin (COUMADIN) tablet 5 mg  5 mg Oral ONCE-1800 Kendra P Hiatt, RPH      . Warfarin - Pharmacist Dosing Inpatient   Does not apply q1800 Lauren D Bajbus, RPH       Facility-Administered Medications Ordered in Other Encounters  Medication Dose Route Frequency Provider Last Rate Last Dose  . 0.9 %  sodium chloride infusion   Intravenous Once Curt Bears, MD   Stopped at 03/21/15 1720  . 0.9 %  sodium chloride infusion   Intravenous Once Curt Bears, MD      . 0.9 %  sodium chloride infusion   Intravenous Once Curt Bears, MD   Stopped at 07/31/15 1638     Discharge Medications: Please see discharge summary for a list of discharge  medications.  Relevant Imaging Results:  Relevant Lab Results:   Additional Information SS# [id number]   PT'S CHEMOTHERAPY ON HOLD UNTIL HEALED AND [facility]  Abdulhamid Olgin, Gardiner Rhyme, LCSW

## 2015-10-19 NOTE — Progress Notes (Signed)
Social Work Patient ID: [name], female   DOB: 09-Jul-1936, 80 y.o.   MRN: [id number] Met with pt, and three daughter's to discuss discharge plans, they feel pt needs to go NH until she is better and can make arrangements at home. Pt does not have 24 hr supervision at home. Pt has reluctantly agreed to this plan. Have given them the NH which has offered a bed for pt and tow daughter's to go and look at facilities this afternoon. [name] to let worker know which facility they have  Chosen and then insurance can begin the approval process. Daughter's are aware there is no guarantee that pt's insurance will cover this, have to start process and see if in agreement. Either way pt will go to [facility] Monday or home If insurance denies coverage. Audra-PT did get to do some family education when daughter's were here.

## 2015-10-19 NOTE — Progress Notes (Signed)
Social Work Patient ID: [name], female   DOB: 1935/09/08, 80 y.o.   MRN: [id number] Spoke with [name] she and sister are coming up to talk with pt. Still waxing and waning over NHP or going home. Aware FL2 sent out and bed offers are pending. They are aware a decision needs to be made today, with pt's agreement. Waiting for daughter to get here.

## 2015-10-19 NOTE — Progress Notes (Signed)
Physical Therapy Weekly Progress Note  Patient Details  Name: [name] MRN: [id number] Date of Birth: 08/09/1936  Beginning of progress report period: October 13, 2015 End of progress report period: October 19, 2015  Today's Date: 10/19/2015 PT Individual Time: 1400-1500 PT Individual Time Calculation (min): 60 min   Patient has met 5 of 5 short term goals.  She met 6 out of 11 long term goals, and 2 goals were d/c due to pt now with SNF placement. Pt making good progress & is currently at supervision for w/c mobility, supervision/Min A for transfers and gait, but continues to require Min/Mod A for w/c up and down ramp and Min A for car transfers.  Patient continues to demonstrate the following deficits: cognitive & sequencing deficits, as well as decreased endurance and therefore will continue to benefit from skilled PT intervention to enhance overall performance with activity tolerance, balance, attention and awareness.  Patient progressing toward long term goals..  Plan of care revisions: Home ambulation & home w/c goals d/c due to pt now SNF placement due to lack of 24/7 care at home..  PT Short Term Goals Week 1:  PT Short Term Goal 1 (Week 1): Pt will perform bed mobility on flat bed with min A PT Short Term Goal 1 - Progress (Week 1): Met PT Short Term Goal 2 (Week 1): Pt will perform bed <> chair transfers with consistent min A PT Short Term Goal 2 - Progress (Week 1): Met PT Short Term Goal 3 (Week 1): Pt will perform car transfer with mod A PT Short Term Goal 3 - Progress (Week 1): Met PT Short Term Goal 4 (Week 1): Pt will perform ambulation with RW x 15' with mod A PT Short Term Goal 4 - Progress (Week 1): Met PT Short Term Goal 5 (Week 1): Pt will perform w/c mobility x 150' with supervision PT Short Term Goal 5 - Progress (Week 1): Met Week 2:  PT Short Term Goal 1 (Week 2): = LTG which have been downgraded due to SNF placement  Skilled Therapeutic  Interventions/Progress Updates:    Pt received in w/c & agreeable to PT, with no c/o pain.  Pt propelled w/c with BUE from room>rehab apartment. Pt performed stand pivot transfer w/c>elevated bed.  Pt with great difficulty sequencing w/c setup and transfer; required max cuing and steady assist to complete transfer. Pt able to complete all bed mobility tasks with Mod I.  Pt ambulated 25 ft with RW and steady assist; requiring cuing to perform BUE shoulder depression and extension, with pt performing more hopping with RLE as she fatigued.  Pt educated on car transfer and required max cuing for set up & sequencing of task. Pt completed transfer w/c<>car with RW and close supervision assistance from PT.  Pt's daughter, [name], then assisted pt with 2nd transfer w/c<>car.  Pt required max cuing for redirection and attention to ask, as pt attempted to ambulate once she was in standing, instead of completing transfer.  Pt then negotiated ramp in w/c, with Min/Mod A.  Pt required max cuing for proper technique to descend ramp in safe manner, as pt attempted to push w/c wheels down ramp, instead of decreasing speed.  Pt then propelled ~250 ft to her room with decreased safety awareness, as pt required cuing to avoid hitting LLE and LUE while pushing w/c. At the end of the session pt was left with her family present to supervise and quick release belt in place.     Therapy Documentation Precautions:  Precautions Precautions: Fall Required Braces or Orthoses: Other Brace/Splint Other Brace/Splint: Limb protector LLE Restrictions Weight Bearing Restrictions: Yes LLE Weight Bearing: Non weight bearing Other Position/Activity Restrictions: Limb protector when out of bed  Pain: Pain Assessment Pain Assessment: No/denies pain   See Function Navigator for Current Functional Status.  Therapy/Group: Individual Therapy   M  10/19/2015, 3:14 PM   

## 2015-10-19 NOTE — Progress Notes (Signed)
ANTICOAGULATION CONSULT NOTE - Follow Up Consult  Pharmacy Consult for Coumadin Indication: history of DVT  No Known Allergies  Patient Measurements: Height: 5' (152.4 cm) Weight: 112 lb 14 oz (51.2 kg) IBW/kg (Calculated) : 45.5 Heparin Dosing Weight:   Vital Signs: Temp: 98.6 F (37 C) (02/17 0512) Temp Source: Oral (02/17 0512) BP: 132/62 mmHg (02/17 0512) Pulse Rate: 77 (02/17 0512)  Labs:  Recent Labs  10/17/15 9528 10/17/15 1609 10/18/15 0612 10/19/15 0617  HGB  --  9.7* 9.0*  --   HCT  --  30.1* 29.5*  --   PLT  --  390 379  --   LABPROT 19.9*  --  20.8* 23.7*  INR 1.69*  --  1.79* 2.13*  CREATININE  --  0.90  --   --     Estimated Creatinine Clearance: 37.6 mL/min (by C-G formula based on Cr of 0.9).   Medications:  Scheduled:  . acyclovir  400 mg Oral BID  . amLODipine  5 mg Oral Daily  . calcium-vitamin D  1 tablet Oral Daily  . clopidogrel  75 mg Oral Daily  . docusate sodium  100 mg Oral Daily  . enoxaparin  1.5 mg/kg Subcutaneous Q24H  . glipiZIDE  2.5 mg Oral Q breakfast  . hydrochlorothiazide  12.5 mg Oral Daily  . insulin aspart  0-9 Units Subcutaneous TID WC  . lisinopril  5 mg Oral Daily  . pantoprazole  40 mg Oral Daily  . potassium chloride SA  20 mEq Oral Daily  . Warfarin - Pharmacist Dosing Inpatient   Does not apply q1800    Assessment: 80yo female with history of DVT. INR at goal this AM.   Hg was stable & pltc wnl on 2/16. No bleeding noted. Pt is also on Lovenox bridge 1.'5mg'$ /kg SQ q24, with plan to d/c 2/18 if INR remain at goal  Goal of Therapy:  INR 2-3 Monitor platelets by anticoagulation protocol: Yes  Plan:  Repeat Coumadin '5mg'$  today Plan for alternating Coumadin regimen to maintain goal INR Continue Lovenox bridge Watch for s/s of bleeding    Gracy Bruins, PharmD [facility]

## 2015-10-20 ENCOUNTER — Inpatient Hospital Stay (HOSPITAL_COMMUNITY): Payer: Medicare Other | Admitting: Occupational Therapy

## 2015-10-20 LAB — GLUCOSE, CAPILLARY
GLUCOSE-CAPILLARY: 67 mg/dL (ref 65–99)
GLUCOSE-CAPILLARY: 93 mg/dL (ref 65–99)
Glucose-Capillary: 134 mg/dL — ABNORMAL HIGH (ref 65–99)
Glucose-Capillary: 155 mg/dL — ABNORMAL HIGH (ref 65–99)
Glucose-Capillary: 121 mg/dL — ABNORMAL HIGH (ref 65–99)
Glucose-Capillary: 62 mg/dL — ABNORMAL LOW (ref 65–99)

## 2015-10-20 LAB — PROTIME-INR
INR: 2.31 — ABNORMAL HIGH (ref 0.00–1.49)
PROTHROMBIN TIME: 25.1 s — AB (ref 11.6–15.2)

## 2015-10-20 MED ORDER — WARFARIN SODIUM 5 MG PO TABS
5.0000 mg | ORAL_TABLET | Freq: Once | ORAL | Status: AC
  Administered 2015-10-20: 5 mg via ORAL
  Filled 2015-10-20: qty 1

## 2015-10-20 NOTE — Progress Notes (Signed)
ANTICOAGULATION CONSULT NOTE - Follow Up Consult  Pharmacy Consult for Coumadin Indication: history of DVT  No Known Allergies  Patient Measurements: Height: 5' (152.4 cm) Weight: 112 lb 14 oz (51.2 kg) IBW/kg (Calculated) : 45.5 Heparin Dosing Weight:   Vital Signs: Temp: 98.3 F (36.8 C) (02/18 0647) Temp Source: Oral (02/18 0647) BP: 113/56 mmHg (02/18 0647) Pulse Rate: 81 (02/18 0647)  Labs:  Recent Labs  10/17/15 1609 10/18/15 0612 10/19/15 0617 10/20/15 0536  HGB 9.7* 9.0*  --   --   HCT 30.1* 29.5*  --   --   PLT 390 379  --   --   LABPROT  --  20.8* 23.7* 25.1*  INR  --  1.79* 2.13* 2.31*  CREATININE 0.90  --   --   --     Estimated Creatinine Clearance: 37.6 mL/min (by C-G formula based on Cr of 0.9).   Medications:  Scheduled:  . acyclovir  400 mg Oral BID  . amLODipine  5 mg Oral Daily  . calcium-vitamin D  1 tablet Oral Daily  . clopidogrel  75 mg Oral Daily  . docusate sodium  100 mg Oral Daily  . glipiZIDE  2.5 mg Oral Q breakfast  . hydrochlorothiazide  12.5 mg Oral Daily  . insulin aspart  0-9 Units Subcutaneous TID WC  . lisinopril  5 mg Oral Daily  . pantoprazole  40 mg Oral Daily  . potassium chloride SA  20 mEq Oral Daily  . Warfarin - Pharmacist Dosing Inpatient   Does not apply q1800    Assessment: 80yo female with history of DVT. INR at goal this AM with trend up (lovenox d/c today)  Goal of Therapy:  INR 2-3 Monitor platelets by anticoagulation protocol: Yes  Plan:  Repeat Coumadin '5mg'$  today May be able to consider '5mg'$  qday x 7.'5mg'$  TTSu Daily PT/INR  Hildred Laser, Pharm D 10/20/2015 11:46 AM

## 2015-10-20 NOTE — Progress Notes (Signed)
Occupational Therapy Session Note  Patient Details  Name: [name] MRN: [id number] Date of Birth: [birth year]  Today's Date: 10/20/2015 OT Individual Time: 0900-1000 OT Individual Time Calculation (min): 60 min    Short Term Goals: Week 1:  OT Short Term Goal 1 (Week 1): Pt will perform toilet transfer with supervision  OT Short Term Goal 2 (Week 1): Pt will perform 3/3 toileting task using sit to stand for hygiene with steadying A OT Short Term Goal 3 (Week 1): Pt will perform 1 of 5 grooming task while standing with steadying A      Skilled Therapeutic Interventions/Progress Updates:    Pt seen for skilled OT to facilitate functional mobility with ADL retraining. Pt used stand pivot transfers with S to toilet and tub bench in shower and then once self care completed, she practiced stand pivot transfers with RW using small hops.  Worked on standing balance with alternating hand support on RW to adjust pants over hips.  Sit to stand practice and w/c push ups. Good activity tolerance during session. Min cues to use armrests for safe sit to stand. Pt in room with daughter in the room and quick release belt on in w/c.  Therapy Documentation Precautions:  Precautions Precautions: Fall Required Braces or Orthoses: Other Brace/Splint Other Brace/Splint: Limb protector LLE Restrictions Weight Bearing Restrictions: Yes LLE Weight Bearing: Non weight bearing Other Position/Activity Restrictions: Limb protector when out of bed    Vital Signs: Therapy Vitals Temp: 98.2 F (36.8 C) Temp Source: Oral Pulse Rate: 76 Resp: 16 BP: (!) 130/52 mmHg Patient Position (if appropriate): Sitting Oxygen Therapy SpO2: 97 % O2 Device: Not Delivered Pain: Pain Assessment Pain Score: 6  Pain Type: Surgical pain Pain Location: Leg Pain Orientation: Left Pain Descriptors / Indicators: Aching Pain Onset: On-going Patients Stated Pain Goal: 2 Pain Intervention(s): Medication (See  eMAR) ADL:   See Function Navigator for Current Functional Status.   Therapy/Group: Individual Therapy  SAGUIER,JULIA 10/20/2015, 3:57 PM

## 2015-10-20 NOTE — Progress Notes (Signed)
[place] PHYSICAL MEDICINE & REHABILITATION     PROGRESS NOTE  Subjective/Complaints:  Patient seen resting in bed this morning. She inquires if there is drainage from her stump site, which there is not.    ROS: Denies stump pain, CP, SOB, N/V/D  Objective: Vital Signs: Blood pressure 113/56, pulse 81, temperature 98.3 F (36.8 C), temperature source Oral, resp. rate 18, height 5' (1.524 m), weight 51.2 kg (112 lb 14 oz), SpO2 99 %. No results found.  Recent Labs  10/17/15 1609 10/18/15 0612  WBC 6.3 3.8*  HGB 9.7* 9.0*  HCT 30.1* 29.5*  PLT 390 379    Recent Labs  10/17/15 1609  NA 140  K 4.1  CL 105  GLUCOSE 91  BUN 10  CREATININE 0.90  CALCIUM 9.6   CBG (last 3)   Recent Labs  10/19/15 1723 10/19/15 2120 10/20/15 0716  GLUCAP 114* 86 134*    Wt Readings from Last 3 Encounters:  10/17/15 51.2 kg (112 lb 14 oz)  10/09/15 49.6 kg (109 lb 5.6 oz)  09/26/15 50.803 kg (112 lb)    Physical Exam:  BP 113/56 mmHg  Pulse 81  Temp(Src) 98.3 F (36.8 C) (Oral)  Resp 18  Ht 5' (1.524 m)  Wt 51.2 kg (112 lb 14 oz)  BMI 22.04 kg/m2  SpO2 99% Gen: Frail appearing. No acute distress. Vital signs reviewed. HENT: Normocephalic, atraumatic.  Eyes: EOMI and and conjunctiva within normal limits. Cardiovascular: Normal rate and regular rhythm. No murmur or rubs Respiratory: Effort normal and breath sounds normal. No respiratory distress.  GI: Soft. Bowel sounds are normal. She exhibits no distension. No pain Neurological:  Patient is alert and oriented x3 with increased time. Follows basic commands and answers simple questions.  Expressive deficits B/l UE 4/5 proximal distal.  RLE: Hip flexion , knee extension 4+/5, ankle dorsi/plantar flexion 4/5.  LLE: Hip flexion 4-/5  Dysarthric speech Skin: BKA with staples, no drainage. Central/lateral area with blister/necrotic area which is stable    Psych: pleasant, slow, flat, quiet  Assessment/Plan: 1.  Functional deficits secondary to peripheral vascular disease status post left BKA 10/09/2015 which require 3+ hours per day of interdisciplinary therapy in a comprehensive inpatient rehab setting. Physiatrist is providing close team supervision and 24 hour management of active medical problems listed below. Physiatrist and rehab team continue to assess barriers to discharge/monitor patient progress toward functional and medical goals.  Function:  Bathing Bathing position   Position: Shower  Bathing parts Body parts bathed by patient: Right arm, Left arm, Chest, Abdomen, Front perineal area, Buttocks, Right upper leg, Left upper leg, Right lower leg Body parts bathed by helper: Back  Bathing assist Assist Level: Touching or steadying assistance(Pt > 75%)   Set up : To obtain items, To open containers, To adjust water temperature  Upper Body Dressing/Undressing Upper body dressing   What is the patient wearing?: Bra, Pull over shirt/dress Bra - Perfomed by patient: Thread/unthread right bra strap, Thread/unthread left bra strap, Hook/unhook bra (pull down sports bra) Bra - Perfomed by helper: Hook/unhook bra (pull down sports bra) Pull over shirt/dress - Perfomed by patient: Thread/unthread right sleeve, Thread/unthread left sleeve, Put head through opening, Pull shirt over trunk          Upper body assist Assist Level: Set up   Set up : To obtain clothing/put away  Lower Body Dressing/Undressing Lower body dressing   What is the patient wearing?: Underwear, Pants, Socks, Shoes Underwear - Performed  by patient: Thread/unthread right underwear leg, Thread/unthread left underwear leg, Pull underwear up/down Underwear - Performed by helper: Thread/unthread left underwear leg Pants- Performed by patient: Thread/unthread right pants leg, Thread/unthread left pants leg, Pull pants up/down, Fasten/unfasten pants Pants- Performed by helper: Thread/unthread left pants leg     Socks -  Performed by patient: Don/doff right sock Socks - Performed by helper: Don/doff right sock Shoes - Performed by patient: Don/doff right shoe, Fasten right Shoes - Performed by helper: Don/doff right shoe, Fasten right          Lower body assist Assist for lower body dressing: Set up   Set up : To obtain clothing/put away  Toileting Toileting Toileting activity did not occur: No continent bowel/bladder event Toileting steps completed by patient: Adjust clothing prior to toileting, Performs perineal hygiene, Adjust clothing after toileting Toileting steps completed by helper: Adjust clothing prior to toileting, Performs perineal hygiene, Adjust clothing after toileting Toileting Assistive Devices: Grab bar or rail  Toileting assist Assist level: Supervision or verbal cues   Transfers Chair/bed transfer   Chair/bed transfer method: Stand pivot Chair/bed transfer assist level: Touching or steadying assistance (Pt > 75%) Chair/bed transfer assistive device: Armrests     Locomotion Ambulation     Max distance: 25 ft Assist level: Touching or steadying assistance (Pt > 75%)   Wheelchair   Type: Manual Max wheelchair distance: 250 Assist Level: Supervision or verbal cues  Cognition Comprehension Comprehension assist level: Understands complex 90% of the time/cues 10% of the time  Expression Expression assist level: Expresses basic needs/ideas: With no assist  Social Interaction Social Interaction assist level: Interacts appropriately 90% of the time - Needs monitoring or encouragement for participation or interaction.  Problem Solving Problem solving assist level: Solves basic problems with no assist  Memory Memory assist level: Recognizes or recalls 90% of the time/requires cueing < 10% of the time     Medical Problem List and Plan: 1. Ischemic left lower extremity with gait and mobility secondary to peripheral vascular disease status post left BKA 10/09/2015  Continue CIR    -continue ACE 2. DVT Prophylaxis/Anticoagulation: Chronic Coumadin for history of DVT. Monitor for any bleeding episodes   -coumadin therapeutic today---stop lovenox 3. Pain Management: Presently on oxycodone. Monitor with increased mobility  Cont to wean  4. Multiple myeloma. Follow-up oncology service Dr. Julien Nordmann on plan of care for any further chemotherapy to be addressed as an outpatient 5. Neuropsych: This patient is capable of making decisions on her own behalf. 6. Skin/Wound Care: Routine skin checks  -necrotic area/blister along incision which will require ongoing outpt follow up and care---for now keep clean/dry 7. Fluids/Electrolytes/Nutrition: Routine I&O's with follow-up chemistries 8. Acute blood loss anemia.   Hb 9.0 on 2/16 9. Mood/anxiety. Xanax 0.5 mg twice daily as needed. Provide emotional support 10. Diabetes mellitus and peripheral neuropathy. Hemoglobin A1c 6.6. Glucotrol XL 2.5 mg daily. Check blood sugars before meals and at bedtime. Diabetic teaching  Fasting CBG 134 this AM 11. Hypertension. Lisinopril 20 mg daily, HCTZ 12.5 mg daily, Norvasc 5 mg daily.   -fair control  LOS (Days) 8 A FACE TO FACE EVALUATION WAS PERFORMED  Reana Chacko T 10/20/2015 9:37 AM

## 2015-10-21 ENCOUNTER — Inpatient Hospital Stay (HOSPITAL_COMMUNITY): Payer: Medicare Other | Admitting: Physical Therapy

## 2015-10-21 LAB — GLUCOSE, CAPILLARY
GLUCOSE-CAPILLARY: 126 mg/dL — AB (ref 65–99)
GLUCOSE-CAPILLARY: 98 mg/dL (ref 65–99)
Glucose-Capillary: 144 mg/dL — ABNORMAL HIGH (ref 65–99)
Glucose-Capillary: 75 mg/dL (ref 65–99)

## 2015-10-21 LAB — CBC WITH DIFFERENTIAL/PLATELET
Basophils Absolute: 0 10*3/uL (ref 0.0–0.1)
Basophils Relative: 0 %
Eosinophils Absolute: 0.1 10*3/uL (ref 0.0–0.7)
Eosinophils Relative: 3 %
HEMATOCRIT: 29.3 % — AB (ref 36.0–46.0)
HEMOGLOBIN: 8.8 g/dL — AB (ref 12.0–15.0)
LYMPHS ABS: 0.5 10*3/uL — AB (ref 0.7–4.0)
LYMPHS PCT: 11 %
MCH: 28.5 pg (ref 26.0–34.0)
MCHC: 30 g/dL (ref 30.0–36.0)
MCV: 94.8 fL (ref 78.0–100.0)
MONO ABS: 0.2 10*3/uL (ref 0.1–1.0)
MONOS PCT: 4 %
NEUTROS ABS: 3.6 10*3/uL (ref 1.7–7.7)
NEUTROS PCT: 82 %
Platelets: 429 10*3/uL — ABNORMAL HIGH (ref 150–400)
RBC: 3.09 MIL/uL — ABNORMAL LOW (ref 3.87–5.11)
RDW: 15.4 % (ref 11.5–15.5)
WBC: 4.4 10*3/uL (ref 4.0–10.5)

## 2015-10-21 LAB — PROTIME-INR
INR: 2.71 — ABNORMAL HIGH (ref 0.00–1.49)
PROTHROMBIN TIME: 28.3 s — AB (ref 11.6–15.2)

## 2015-10-21 MED ORDER — WARFARIN SODIUM 2.5 MG PO TABS
2.5000 mg | ORAL_TABLET | Freq: Once | ORAL | Status: AC
  Administered 2015-10-21: 2.5 mg via ORAL
  Filled 2015-10-21: qty 1

## 2015-10-21 NOTE — Progress Notes (Signed)
[facility] [place] PHYSICAL MEDICINE & REHABILITATION     PROGRESS NOTE  Subjective/Complaints:  No new issues. Pain in stump at times which comes and goes  ROS: Denies stump pain, CP, SOB, N/V/D  Objective: Vital Signs: Blood pressure 145/56, pulse 70, temperature 98.3 F (36.8 C), temperature source Oral, resp. rate 18, height 5' (1.524 m), weight 51.2 kg (112 lb 14 oz), SpO2 100 %. No results found.  Recent Labs  10/21/15 0534  WBC 4.4  HGB 8.8*  HCT 29.3*  PLT 429*   No results for input(s): NA, K, CL, GLUCOSE, BUN, CREATININE, CALCIUM in the last 72 hours.  Invalid input(s): CO CBG (last 3)   Recent Labs  10/20/15 2127 10/20/15 2155 10/21/15 0648  GLUCAP 67 93 144*    Wt Readings from Last 3 Encounters:  10/17/15 51.2 kg (112 lb 14 oz)  10/09/15 49.6 kg (109 lb 5.6 oz)  09/26/15 50.803 kg (112 lb)    Physical Exam:  BP 145/56 mmHg  Pulse 70  Temp(Src) 98.3 F (36.8 C) (Oral)  Resp 18  Ht 5' (1.524 m)  Wt 51.2 kg (112 lb 14 oz)  BMI 22.04 kg/m2  SpO2 100% Gen: Frail appearing. No acute distress. Vital signs reviewed. HENT: Normocephalic, atraumatic.  Eyes: EOMI and and conjunctiva within normal limits. Cardiovascular: Normal rate and regular rhythm. No murmur or rubs Respiratory: Effort normal and breath sounds normal. No respiratory distress.  GI: Soft. Bowel sounds are normal. She exhibits no distension. No pain Neurological:  Patient is alert and oriented x3 with increased time. Follows basic commands and answers simple questions.  Expressive deficits B/l UE 4/5 proximal distal.  RLE: Hip flexion , knee extension 4+/5, ankle dorsi/plantar flexion 4/5.  LLE: Hip flexion 4-/5  Dysarthric speech Skin: BKA with staples, no drainage. Central/lateral area with blister/necrotic area      Psych: pleasant, slow, flat, quiet  Assessment/Plan: 1. Functional deficits secondary to peripheral vascular disease status post left BKA 10/09/2015 which require 3+  hours per day of interdisciplinary therapy in a comprehensive inpatient rehab setting. Physiatrist is providing close team supervision and 24 hour management of active medical problems listed below. Physiatrist and rehab team continue to assess barriers to discharge/monitor patient progress toward functional and medical goals.  Function:  Bathing Bathing position   Position: Shower  Bathing parts Body parts bathed by patient: Right arm, Left arm, Chest, Abdomen, Front perineal area, Buttocks, Right upper leg, Left upper leg, Right lower leg Body parts bathed by helper: Back  Bathing assist Assist Level: Supervision or verbal cues   Set up : To obtain items, To open containers, To adjust water temperature  Upper Body Dressing/Undressing Upper body dressing   What is the patient wearing?: Bra, Pull over shirt/dress Bra - Perfomed by patient: Thread/unthread right bra strap, Thread/unthread left bra strap, Hook/unhook bra (pull down sports bra) Bra - Perfomed by helper: Hook/unhook bra (pull down sports bra) Pull over shirt/dress - Perfomed by patient: Thread/unthread right sleeve, Thread/unthread left sleeve, Put head through opening, Pull shirt over trunk          Upper body assist Assist Level: Set up   Set up : To obtain clothing/put away  Lower Body Dressing/Undressing Lower body dressing   What is the patient wearing?: Underwear, Pants, Socks, Shoes Underwear - Performed by patient: Thread/unthread right underwear leg, Thread/unthread left underwear leg, Pull underwear up/down Underwear - Performed by helper: Thread/unthread left underwear leg Pants- Performed by patient: Thread/unthread right pants  leg, Thread/unthread left pants leg, Pull pants up/down, Fasten/unfasten pants Pants- Performed by helper: Thread/unthread left pants leg     Socks - Performed by patient: Don/doff right sock Socks - Performed by helper: Don/doff right sock Shoes - Performed by patient: Don/doff  right shoe, Fasten right Shoes - Performed by helper: Don/doff right shoe, Fasten right          Lower body assist Assist for lower body dressing: Touching or steadying assistance (Pt > 75%) (standing with RW)   Set up : To obtain clothing/put away  Toileting Toileting Toileting activity did not occur: No continent bowel/bladder event Toileting steps completed by patient: Adjust clothing prior to toileting, Performs perineal hygiene, Adjust clothing after toileting Toileting steps completed by helper: Adjust clothing prior to toileting, Performs perineal hygiene, Adjust clothing after toileting Toileting Assistive Devices: Grab bar or rail  Toileting assist Assist level: Supervision or verbal cues   Transfers Chair/bed transfer   Chair/bed transfer method: Stand pivot Chair/bed transfer assist level: Touching or steadying assistance (Pt > 75%) Chair/bed transfer assistive device: Medical sales representative     Max distance: 25 ft Assist level: Touching or steadying assistance (Pt > 75%)   Wheelchair   Type: Manual Max wheelchair distance: 250 Assist Level: Supervision or verbal cues  Cognition Comprehension Comprehension assist level: Understands complex 90% of the time/cues 10% of the time  Expression Expression assist level: Expresses basic needs/ideas: With no assist  Social Interaction Social Interaction assist level: Interacts appropriately 90% of the time - Needs monitoring or encouragement for participation or interaction.  Problem Solving Problem solving assist level: Solves basic problems with no assist  Memory Memory assist level: Recognizes or recalls 90% of the time/requires cueing < 10% of the time     Medical Problem List and Plan: 1. Ischemic left lower extremity with gait and mobility secondary to peripheral vascular disease status post left BKA 10/09/2015  Continue CIR   -continue ACE---probably would hold off on shrinker given necrotic central  area 2. DVT Prophylaxis/Anticoagulation: Chronic Coumadin for history of DVT. Monitor for any bleeding episodes   -coumadin therapeutic today---stop lovenox 3. Pain Management: Presently on oxycodone. Monitor with increased mobility  Cont to wean as possible 4. Multiple myeloma. Follow-up oncology service Dr. Julien Nordmann on plan of care for any further chemotherapy to be addressed as an outpatient 5. Neuropsych: This patient is capable of making decisions on her own behalf. 6. Skin/Wound Care: Routine skin checks  -necrotic area/blister along incision which will require ongoing outpt follow up and care---for now keep clean/dry 7. Fluids/Electrolytes/Nutrition: Routine I&O's with follow-up chemistries 8. Acute blood loss anemia.   Hb 9.0 on 2/16 9. Mood/anxiety. Xanax 0.5 mg twice daily as needed. Provide emotional support 10. Diabetes mellitus and peripheral neuropathy. Hemoglobin A1c 6.6. Glucotrol XL 2.5 mg daily. Check blood sugars before meals and at bedtime. Diabetic teaching  Fasting CBG 144 this AM  -hypoglycemic yesterday---watch for pattern 11. Hypertension. Lisinopril 20 mg daily, HCTZ 12.5 mg daily, Norvasc 5 mg daily.   -fair control  LOS (Days) 9 A FACE TO FACE EVALUATION WAS PERFORMED  Skylen Spiering T 10/21/2015 9:02 AM

## 2015-10-21 NOTE — Progress Notes (Signed)
ANTICOAGULATION CONSULT NOTE - Follow Up Consult  Pharmacy Consult for Coumadin Indication: history of DVT  No Known Allergies  Patient Measurements: Height: 5' (152.4 cm) Weight: 112 lb 14 oz (51.2 kg) IBW/kg (Calculated) : 45.5 Heparin Dosing Weight:   Vital Signs: Temp: 98.3 F (36.8 C) (02/19 0613) Temp Source: Oral (02/19 0613) BP: 145/56 mmHg (02/19 0613) Pulse Rate: 70 (02/19 0613)  Labs:  Recent Labs  10/19/15 0617 10/20/15 0536 10/21/15 0534  HGB  --   --  8.8*  HCT  --   --  29.3*  PLT  --   --  429*  LABPROT 23.7* 25.1* 28.3*  INR 2.13* 2.31* 2.71*    Estimated Creatinine Clearance: 37.6 mL/min (by C-G formula based on Cr of 0.9).   Medications:  Scheduled:  . acyclovir  400 mg Oral BID  . amLODipine  5 mg Oral Daily  . calcium-vitamin D  1 tablet Oral Daily  . clopidogrel  75 mg Oral Daily  . docusate sodium  100 mg Oral Daily  . glipiZIDE  2.5 mg Oral Q breakfast  . hydrochlorothiazide  12.5 mg Oral Daily  . insulin aspart  0-9 Units Subcutaneous TID WC  . lisinopril  5 mg Oral Daily  . pantoprazole  40 mg Oral Daily  . potassium chloride SA  20 mEq Oral Daily  . Warfarin - Pharmacist Dosing Inpatient   Does not apply q1800    Assessment: 80yo female with history of DVT. INR at goal this AM with continued trend up (1.79>>2.13>>2.31>>2.71)  Goal of Therapy:  INR 2-3 Monitor platelets by anticoagulation protocol: Yes  Plan:  Coumadin 2.'5mg'$  today Daily PT/INR  Hildred Laser, Pharm D 10/21/2015 11:26 AM

## 2015-10-21 NOTE — Progress Notes (Signed)
Physical Therapy Session Note  Patient Details  Name: [name] MRN: [id number] Date of Birth: 24-Jan-1936  Today's Date: 10/21/2015 PT Individual Time: 1003-1102 PT Individual Time Calculation (min): 59 min   Short Term Goals: Week 2:  PT Short Term Goal 1 (Week 2): = LTG which have been downgraded due to SNF placement  Skilled Therapeutic Interventions/Progress Updates:   Pt received sitting in w/c in room, agreeable to therapy session.  Assisted with donning limb guard prior to leaving room.  Pt self propelled to/from therapy gym at S level with min cues for steering technique during turing in order to work on UE strengthening and endurance.  Once in therapy gym, assisted with donning R shoe prior to gait training.  Also practiced transfers during session with RW to improve technique and sequencing with set up and performance.  Performed 3-4 times with RW with mod cues each time for hand placement and safety with RW.  Note that following one bout of gait when turning to chair, she attempted to sit before being completely to chair and requires total A to recover to chair level to prevent fall.  Max education on importance of ensuring position prior to sitting.  Pt verbalized understanding.  Performed gait x 25' x 1 and 30' x 2 reps with RW at min/guard level with continued cues for RW placement, slower gait speed and increased length of R step length to avoid over fatigue in UEs.  Performed seated nustep (with L residual limb to side to avoid injury) x 5 mins at level 3 resistance with BUEs/RLE to address overall strength and endurance.   Tolerated well during session.  Pt propelled back to room and was left in w/c with QRB donned and all needs in reach.    Therapy Documentation Precautions:  Precautions Precautions: Fall Required Braces or Orthoses: Other Brace/Splint Other Brace/Splint: Limb protector LLE Restrictions Weight Bearing Restrictions: Yes LLE Weight Bearing: Non weight  bearing Other Position/Activity Restrictions: Limb protector when out of bed  Pain:  Pt with reports of pain in RLE during session, states she has had morning pain medication.    See Function Navigator for Current Functional Status.   Therapy/Group: Individual Therapy  Denice Bors 10/21/2015, 10:37 AM

## 2015-10-22 ENCOUNTER — Inpatient Hospital Stay (HOSPITAL_COMMUNITY): Payer: Medicare Other | Admitting: Physical Therapy

## 2015-10-22 ENCOUNTER — Inpatient Hospital Stay (HOSPITAL_COMMUNITY): Payer: Medicare Other

## 2015-10-22 DIAGNOSIS — F329 Major depressive disorder, single episode, unspecified: Secondary | ICD-10-CM | POA: Diagnosis not present

## 2015-10-22 DIAGNOSIS — D649 Anemia, unspecified: Secondary | ICD-10-CM | POA: Diagnosis not present

## 2015-10-22 DIAGNOSIS — R05 Cough: Secondary | ICD-10-CM | POA: Diagnosis not present

## 2015-10-22 DIAGNOSIS — I428 Other cardiomyopathies: Secondary | ICD-10-CM | POA: Diagnosis not present

## 2015-10-22 DIAGNOSIS — G64 Other disorders of peripheral nervous system: Secondary | ICD-10-CM | POA: Diagnosis not present

## 2015-10-22 DIAGNOSIS — D62 Acute posthemorrhagic anemia: Secondary | ICD-10-CM | POA: Diagnosis not present

## 2015-10-22 DIAGNOSIS — E785 Hyperlipidemia, unspecified: Secondary | ICD-10-CM | POA: Diagnosis not present

## 2015-10-22 DIAGNOSIS — F419 Anxiety disorder, unspecified: Secondary | ICD-10-CM | POA: Diagnosis not present

## 2015-10-22 DIAGNOSIS — R509 Fever, unspecified: Secondary | ICD-10-CM | POA: Diagnosis not present

## 2015-10-22 DIAGNOSIS — I82409 Acute embolism and thrombosis of unspecified deep veins of unspecified lower extremity: Secondary | ICD-10-CM | POA: Diagnosis not present

## 2015-10-22 DIAGNOSIS — E119 Type 2 diabetes mellitus without complications: Secondary | ICD-10-CM | POA: Diagnosis not present

## 2015-10-22 DIAGNOSIS — T879 Unspecified complications of amputation stump: Secondary | ICD-10-CM | POA: Diagnosis not present

## 2015-10-22 DIAGNOSIS — Z7901 Long term (current) use of anticoagulants: Secondary | ICD-10-CM | POA: Diagnosis not present

## 2015-10-22 DIAGNOSIS — R011 Cardiac murmur, unspecified: Secondary | ICD-10-CM | POA: Diagnosis not present

## 2015-10-22 DIAGNOSIS — D500 Iron deficiency anemia secondary to blood loss (chronic): Secondary | ICD-10-CM | POA: Diagnosis not present

## 2015-10-22 DIAGNOSIS — Z86718 Personal history of other venous thrombosis and embolism: Secondary | ICD-10-CM | POA: Diagnosis not present

## 2015-10-22 DIAGNOSIS — I824Z2 Acute embolism and thrombosis of unspecified deep veins of left distal lower extremity: Secondary | ICD-10-CM | POA: Diagnosis not present

## 2015-10-22 DIAGNOSIS — C9000 Multiple myeloma not having achieved remission: Secondary | ICD-10-CM | POA: Diagnosis not present

## 2015-10-22 DIAGNOSIS — Z87891 Personal history of nicotine dependence: Secondary | ICD-10-CM | POA: Diagnosis not present

## 2015-10-22 DIAGNOSIS — Y835 Amputation of limb(s) as the cause of abnormal reaction of the patient, or of later complication, without mention of misadventure at the time of the procedure: Secondary | ICD-10-CM | POA: Diagnosis present

## 2015-10-22 DIAGNOSIS — Z5181 Encounter for therapeutic drug level monitoring: Secondary | ICD-10-CM | POA: Diagnosis not present

## 2015-10-22 DIAGNOSIS — K219 Gastro-esophageal reflux disease without esophagitis: Secondary | ICD-10-CM | POA: Diagnosis not present

## 2015-10-22 DIAGNOSIS — Z85820 Personal history of malignant melanoma of skin: Secondary | ICD-10-CM | POA: Diagnosis not present

## 2015-10-22 DIAGNOSIS — Z7984 Long term (current) use of oral hypoglycemic drugs: Secondary | ICD-10-CM | POA: Diagnosis not present

## 2015-10-22 DIAGNOSIS — E1169 Type 2 diabetes mellitus with other specified complication: Secondary | ICD-10-CM | POA: Diagnosis not present

## 2015-10-22 DIAGNOSIS — E1140 Type 2 diabetes mellitus with diabetic neuropathy, unspecified: Secondary | ICD-10-CM | POA: Diagnosis not present

## 2015-10-22 DIAGNOSIS — I10 Essential (primary) hypertension: Secondary | ICD-10-CM | POA: Diagnosis not present

## 2015-10-22 DIAGNOSIS — C9001 Multiple myeloma in remission: Secondary | ICD-10-CM | POA: Diagnosis not present

## 2015-10-22 DIAGNOSIS — I429 Cardiomyopathy, unspecified: Secondary | ICD-10-CM | POA: Diagnosis not present

## 2015-10-22 DIAGNOSIS — Z89512 Acquired absence of left leg below knee: Secondary | ICD-10-CM | POA: Diagnosis not present

## 2015-10-22 DIAGNOSIS — Z7902 Long term (current) use of antithrombotics/antiplatelets: Secondary | ICD-10-CM | POA: Diagnosis not present

## 2015-10-22 DIAGNOSIS — F411 Generalized anxiety disorder: Secondary | ICD-10-CM | POA: Diagnosis not present

## 2015-10-22 DIAGNOSIS — M1990 Unspecified osteoarthritis, unspecified site: Secondary | ICD-10-CM | POA: Diagnosis not present

## 2015-10-22 DIAGNOSIS — T8781 Dehiscence of amputation stump: Secondary | ICD-10-CM | POA: Diagnosis not present

## 2015-10-22 DIAGNOSIS — I96 Gangrene, not elsewhere classified: Secondary | ICD-10-CM | POA: Diagnosis not present

## 2015-10-22 DIAGNOSIS — M6281 Muscle weakness (generalized): Secondary | ICD-10-CM | POA: Diagnosis not present

## 2015-10-22 DIAGNOSIS — E1151 Type 2 diabetes mellitus with diabetic peripheral angiopathy without gangrene: Secondary | ICD-10-CM | POA: Diagnosis not present

## 2015-10-22 DIAGNOSIS — E784 Other hyperlipidemia: Secondary | ICD-10-CM | POA: Diagnosis not present

## 2015-10-22 DIAGNOSIS — T8189XA Other complications of procedures, not elsewhere classified, initial encounter: Secondary | ICD-10-CM | POA: Diagnosis not present

## 2015-10-22 DIAGNOSIS — I2510 Atherosclerotic heart disease of native coronary artery without angina pectoris: Secondary | ICD-10-CM | POA: Diagnosis not present

## 2015-10-22 LAB — PROTIME-INR
INR: 2.35 — AB (ref 0.00–1.49)
PROTHROMBIN TIME: 25.5 s — AB (ref 11.6–15.2)

## 2015-10-22 LAB — GLUCOSE, CAPILLARY
GLUCOSE-CAPILLARY: 158 mg/dL — AB (ref 65–99)
GLUCOSE-CAPILLARY: 121 mg/dL — AB (ref 65–99)

## 2015-10-22 NOTE — Progress Notes (Signed)
Social Work  Discharge Note  The overall goal for the admission was met for:   Discharge location: No-[facility]-SNF  Length of Stay: No-10 DAYS  Discharge activity level: Yes-SUPERVISION/MIN ASSIST WHEELCHAIR LEVEL  Home/community participation: Yes  Services provided included: MD, RD, PT, OT, SLP, RN, CM, TR, Pharmacy, Neuropsych and SW  Financial Services: Private Insurance: [facility]  Follow-up services arranged: Other: NHP  Comments (or additional information):ANDREA FEELS THERE IS NO ONE IN THE FAMILY WHO CAN PROVIDE 24 HR SUPERVISION/MIN ASSIST LEVEL, WANT NH UNTIL CAN BE MOD/I W/C LEVEL. DAUGHTER TO TRANSPORT TO FACILITY.  Patient/Family verbalized understanding of follow-up arrangements: Yes  Individual responsible for coordination of the follow-up plan: ANDREA-DAUGHTER  Confirmed correct DME delivered: Elease Hashimoto 10/22/2015    Elease Hashimoto

## 2015-10-22 NOTE — Progress Notes (Signed)
Physical Therapy Discharge Summary  Patient Details  Name: [name] MRN: [id number] Date of Birth: [birth year]  Today's Date: 10/22/2015 PT Individual Time: 1102-1202 PT Individual Time Calculation (min): 60 min    Patient has met 9 of 9 long term goals due to improved activity tolerance, improved balance, improved postural control, increased strength and decreased pain.  Patient to discharge at a wheelchair level Supervision.   Patient's care partner unavailable to provide the necessary cognitive & physical assistance at discharge. Pt to continue rehab at SNF until 24/7 supervision can be arranged by family.   Reasons goals not met: All goals met.   Recommendation:  Patient will benefit from ongoing skilled PT services in skilled nursing facility setting to continue to advance safe functional mobility, address ongoing impairments in safety awareness, endurance, balance, and ambulatory skills and minimize fall risk.  Equipment: Pt already has personal manual w/c.  Pt provided with a cushion, back rest & amputee support pad. Other equipment deferred to next venue of care.  Reasons for discharge: discharge from hospital  Patient/family agrees with progress made and goals achieved: Yes  PT treatment session: Pt received in w/c & agreeable to PT. Pt c/o 6/10 LLE residual limb pain.  Pt assisted with toilet transfer with supervision assistance. Pt then performed transfer to elevated bed with supervision assistance & maximum cuing for sequencing & set up of w/c beside bed.  Pt performed stand pivot w/c<>bed with S and bed mobility with mod I.  Pt propelled herself to gym & performed car transfer with supervision assistance with RW.  Pt was able to ambulate 30 ft with RW & supervision assistance today, requiring cues to perform BUE shoulder depression & extension, however pt performed more hopping with RLE as she fatigued with task. Pt was able to negotiate a ramp with steady assist; pt with  minimal left inattention & required cues for spatial awareness while on ramp.  Pt performed car transfer with RW & supervision assistance, requiring max cuing for w/c set up.  Pt is also at a supervision level for transfers to level surfaces. Pt educated on negotiating single 2 inch step; pt able to demonstrate task with steady assist with use of parallel bars.  Pt with difficulty placing entire R foot on/off step, requiring steady assist for safety & balance.  Overall, pt is at a Mod I level with w/c mobility and supervision level with all other mobility tasks (i.e. Car transfers, transfers to level/unlevel surfaces, ambulating short distances).  Pt requires supervision for all mobility tasks 2/2 decreased cognition & safety awareness, as pt did try to transfer to standing from w/c without locking w/c brakes during today's session.  Pt requires 24/7 supervision upon discharge for safety.   PT Discharge Precautions/Restrictions Restrictions Weight Bearing Restrictions: Yes LLE Weight Bearing: Non weight bearing  Pain Pain Assessment Pain Assessment: 0-10 Pain Score: 6  Pain Location:  (L residual limb)    Cognition Overall Cognitive Status:  (impaired cognition, but unsure if this is pt's baseline) Arousal/Alertness: Awake/alert Memory: Impaired (minimal carryover of mobility tasks from day-to-day) Safety/Judgment: Impaired (decreased safety awareness; pt will attempt to stand up from w/c before locking w/c brakes) Sensation Sensation Light Touch: Impaired by gross assessment (medial & lateral aspects of L residual limb with impaired sensation to light touch) Light Touch Impaired Details: Impaired LLE   Mobility Bed Mobility Bed Mobility: Rolling Left Rolling Left: 5: Supervision Supine to Sit: 6: Modified independent (Device/Increase time) Sit to Supine: 6:  Modified independent (Device/Increase time) Transfers Transfers: Yes Sit to Stand: 5: Supervision Sit to Stand Details: Verbal  cues for precautions/safety;Verbal cues for sequencing;Verbal cues for technique Stand to Sit: 5: Supervision Stand Pivot Transfers: 5: Supervision Stand Pivot Transfer Details: Verbal cues for technique;Verbal cues for sequencing;Verbal cues for precautions/safety Locomotion  Ambulation Ambulation: Yes Ambulation/Gait Assistance: 5: Supervision Ambulation Distance (Feet): 30 Feet Assistive device: Rolling walker Ambulation/Gait Assistance Details: Verbal cues for technique Stairs / Additional Locomotion Stairs: Yes Stairs Assistance: 4: Min guard Stair Management Technique: Other (comment) (parallel bars) Number of Stairs: 1 Height of Stairs: 2 (2 inches in height) Ramp: 4: Min assist Curb: Not tested (comment) Architect: Yes Wheelchair Assistance: 5: Investment banker, operational Details: Verbal cues for Information systems manager: Both upper extremities Wheelchair Parts Management: Supervision/cueing Distance: 300 ft  Trunk/Postural Assessment  Cervical Assessment Cervical Assessment: Within Water engineer Thoracic Assessment: Within Functional Limits Lumbar Assessment Lumbar Assessment: Within Functional Limits  Balance Balance Balance Assessed: Yes Static Sitting Balance Static Sitting - Balance Support: Feet supported (RLE) Static Sitting - Level of Assistance: 6: Modified independent (Device/Increase time) Static Sitting - Comment/# of Minutes: 1 Dynamic Standing Balance Dynamic Standing - Balance Support: Right upper extremity supported;During functional activity (pt utilized either RUE or LUE support during standing for donning undergarments & pants) Dynamic Standing - Level of Assistance: 5: Stand by assistance Extremity Assessment        LLE Assessment LLE Assessment: Exceptions to [facility] LLE Strength Left Hip Flexion: 4+/5 Left Knee Flexion: 3+/5 (testing limited by pain on distal  portion of LLE residual limb) Left Knee Extension: 4/5 (testing limited by pain on distal end of LLE resldual limb)   See Function Navigator for Current Functional Status.  Waunita Schooner, PT, DPT 10/22/2015, 1:24 PM

## 2015-10-22 NOTE — Progress Notes (Signed)
Social Work Patient ID: [name], female   DOB: [birth year], 80 y.o.   MRN: [id number]  Spoke with Rosebud Poles she has gotten Biochemist, clinical and daughter is there completing the paperwork for admission after lunch. Have informed pt of this and she is agreeable to the plan.
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
## 2015-10-22 NOTE — Progress Notes (Signed)
Report called to Mardene Celeste, LPN at [facility]. Patient discharged with daughter and transported to [facility] via daughter. Patient discharged with her wheelchair and belongings.

## 2015-10-22 NOTE — Progress Notes (Signed)
[facility] PHYSICAL MEDICINE & REHABILITATION     PROGRESS NOTE  Subjective/Complaints:  Patient seen sitting up in bed this morning eating breakfast. She notes no pain this morning.  ROS: +stump pain. Denies CP, SOB, N/V/D  Objective: Vital Signs: Blood pressure 152/61, pulse 76, temperature 98.4 F (36.9 C), temperature source Oral, resp. rate 18, height 5' (1.524 m), weight 51.2 kg (112 lb 14 oz), SpO2 100 %. No results found.  Recent Labs  10/21/15 0534  WBC 4.4  HGB 8.8*  HCT 29.3*  PLT 429*   No results for input(s): NA, K, CL, GLUCOSE, BUN, CREATININE, CALCIUM in the last 72 hours.  Invalid input(s): CO CBG (last 3)   Recent Labs  10/21/15 1120 10/21/15 1627 10/21/15 2051  GLUCAP 126* 75 98    Wt Readings from Last 3 Encounters:  10/17/15 51.2 kg (112 lb 14 oz)  10/09/15 49.6 kg (109 lb 5.6 oz)  09/26/15 50.803 kg (112 lb)    Physical Exam:  BP 152/61 mmHg  Pulse 76  Temp(Src) 98.4 F (36.9 C) (Oral)  Resp 18  Ht 5' (1.524 m)  Wt 51.2 kg (112 lb 14 oz)  BMI 22.04 kg/m2  SpO2 100% Gen: Frail appearing. No acute distress. Vital signs reviewed. HENT: Normocephalic, atraumatic.  Eyes: EOMI and and conjunctiva within normal limits. Cardiovascular: Normal rate and regular rhythm. No murmur or rubs Respiratory: Effort normal and breath sounds normal. No respiratory distress.  GI: Soft. Bowel sounds are normal. She exhibits no distension. No pain Neurological:  Patient is alert and oriented x3 with increased time. Follows basic commands and answers simple questions.  Expressive deficits B/l UE 4/5 proximal distal.  RLE: Hip flexion , knee extension 4+/5, ankle dorsi/plantar flexion 4/5.  LLE: Hip flexion 4-/5 (pain inhibition) Dysarthric speech Skin: BKA with staples, no drainage. Central/lateral area with blister Psych: pleasant, slow, flat, quiet  Assessment/Plan: 1. Functional deficits secondary to peripheral vascular disease status post left  BKA 10/09/2015 which require 3+ hours per day of interdisciplinary therapy in a comprehensive inpatient rehab setting. Physiatrist is providing close team supervision and 24 hour management of active medical problems listed below. Physiatrist and rehab team continue to assess barriers to discharge/monitor patient progress toward functional and medical goals.  Function:  Bathing Bathing position   Position: Shower  Bathing parts Body parts bathed by patient: Right arm, Left arm, Chest, Abdomen, Front perineal area, Buttocks, Right upper leg, Left upper leg, Right lower leg Body parts bathed by helper: Back  Bathing assist Assist Level: Supervision or verbal cues   Set up : To obtain items, To open containers, To adjust water temperature  Upper Body Dressing/Undressing Upper body dressing   What is the patient wearing?: Bra, Pull over shirt/dress Bra - Perfomed by patient: Thread/unthread right bra strap, Thread/unthread left bra strap, Hook/unhook bra (pull down sports bra) Bra - Perfomed by helper: Hook/unhook bra (pull down sports bra) Pull over shirt/dress - Perfomed by patient: Thread/unthread right sleeve, Thread/unthread left sleeve, Put head through opening, Pull shirt over trunk          Upper body assist Assist Level: Set up   Set up : To obtain clothing/put away  Lower Body Dressing/Undressing Lower body dressing   What is the patient wearing?: Underwear, Pants, Socks, Shoes Underwear - Performed by patient: Thread/unthread right underwear leg, Thread/unthread left underwear leg, Pull underwear up/down Underwear - Performed by helper: Thread/unthread left underwear leg Pants- Performed by patient: Thread/unthread right pants leg,  Thread/unthread left pants leg, Pull pants up/down, Fasten/unfasten pants Pants- Performed by helper: Thread/unthread left pants leg     Socks - Performed by patient: Don/doff right sock Socks - Performed by helper: Don/doff right sock Shoes -  Performed by patient: Don/doff right shoe, Fasten right Shoes - Performed by helper: Don/doff right shoe, Fasten right          Lower body assist Assist for lower body dressing: Touching or steadying assistance (Pt > 75%) (standing with RW)   Set up : To obtain clothing/put away  Toileting Toileting Toileting activity did not occur: No continent bowel/bladder event Toileting steps completed by patient: Adjust clothing prior to toileting, Performs perineal hygiene, Adjust clothing after toileting Toileting steps completed by helper: Adjust clothing prior to toileting, Performs perineal hygiene, Adjust clothing after toileting Toileting Assistive Devices: Grab bar or rail  Toileting assist Assist level: Supervision or verbal cues   Transfers Chair/bed transfer   Chair/bed transfer method: Stand pivot Chair/bed transfer assist level: Touching or steadying assistance (Pt > 75%) Chair/bed transfer assistive device: Medical sales representative     Max distance: 30 Assist level: Touching or steadying assistance (Pt > 75%)   Wheelchair   Type: Manual Max wheelchair distance: 200 Assist Level: Supervision or verbal cues  Cognition Comprehension Comprehension assist level: Understands complex 90% of the time/cues 10% of the time  Expression Expression assist level: Expresses basic needs/ideas: With no assist  Social Interaction Social Interaction assist level: Interacts appropriately 90% of the time - Needs monitoring or encouragement for participation or interaction.  Problem Solving Problem solving assist level: Solves basic problems with no assist  Memory Memory assist level: Recognizes or recalls 90% of the time/requires cueing < 10% of the time     Medical Problem List and Plan: 1. Ischemic left lower extremity with gait and mobility secondary to peripheral vascular disease status post left BKA 10/09/2015  D/c to SNF today  -continue ACE 2. DVT  Prophylaxis/Anticoagulation: Chronic Coumadin for history of DVT. Monitor for any bleeding episodes   -Cont coumadin 3. Pain Management: Presently on oxycodone. Monitor with increased mobility  Cont to wean as possible 4. Multiple myeloma. Follow-up oncology service Dr. Julien Nordmann on plan of care for any further chemotherapy to be addressed as an outpatient 5. Neuropsych: This patient is capable of making decisions on her own behalf. 6. Skin/Wound Care: Routine skin checks  -Keep stump with blister clean/dry, will follow up as outpt 7. Fluids/Electrolytes/Nutrition: Routine I&O's with follow-up chemistries 8. Acute blood loss anemia.   Hb 8.8 on 2/20 9. Mood/anxiety. Xanax 0.5 mg twice daily as needed. Provide emotional support 10. Diabetes mellitus and peripheral neuropathy. Hemoglobin A1c 6.6. Glucotrol XL 2.5 mg daily. Check blood sugars before meals and at bedtime. Diabetic teaching  Fasting CBG pending this AM 11. Hypertension. Lisinopril 20 mg daily, HCTZ 12.5 mg daily, Norvasc 5 mg daily.   -fair control  LOS (Days) 10 A FACE TO FACE EVALUATION WAS PERFORMED  Ankit Lorie Phenix 10/22/2015 9:27 AM

## 2015-10-22 NOTE — Progress Notes (Signed)
Occupational Therapy Session Note  Patient Details  Name: [name] MRN: [id number] Date of Birth: Jul 30, 1936  Today's Date: 10/22/2015 OT Individual Time: 9728-2060 OT Individual Time Calculation (min): 75 min    Short Term Goals: Week 1:  OT Short Term Goal 1 (Week 1): Pt will perform toilet transfer with supervision  OT Short Term Goal 2 (Week 1): Pt will perform 3/3 toileting task using sit to stand for hygiene with steadying A OT Short Term Goal 3 (Week 1): Pt will perform 1 of 5 grooming task while standing with steadying A  Skilled Therapeutic Interventions/Progress Updates: ADL-retraining with focus on family education with daughter present Suzi Roots).   Pt completes bed mobility, transfer to w/c, transfer to toilet and tub bench with close supervision, vc for safety and sequencing, and standby assist.   Daughter assists with tub bench transfer and setup for bathing/dressing, observing therapist provide needed cues for sequencing and problem-solving.   Pt completes all BADL with overall supervision assist and remained seated in w/c at end of session with all needs within reach and daughter present.  OT educated daughter on use of commercial home monitor system ([employer]) to enhance family contact when pt ultimately returns to home from SNF.     Therapy Documentation Precautions:  Precautions Precautions: Fall Required Braces or Orthoses: Other Brace/Splint Other Brace/Splint: Limb protector LLE Restrictions Weight Bearing Restrictions: Yes LLE Weight Bearing: Non weight bearing Other Position/Activity Restrictions: Limb protector when out of bed  Pain: No/denies pain   See Function Navigator for Current Functional Status.   Therapy/Group: Individual Therapy  Alden 10/22/2015, 12:20 PM

## 2015-10-22 NOTE — Progress Notes (Signed)
Social Work Patient ID: [name], female   DOB: 08/30/1936, 80 y.o.   MRN: [id number] Family has decided upon [facility], have contacted [facility] who report she still has beds, can have family be there by 11 to do paperwork, she will work on Biochemist, clinical. Spoke with Andrea-daughter to inform she needs to be at facility at 29 to complete paperwork, will transfer pt once insurance approved. Family aware will need to pick up pt's wheelchair For facility since want pt transported via [facility]. Pt is not happy with her family but agreeable to go to facility.

## 2015-10-23 ENCOUNTER — Telehealth: Payer: Self-pay

## 2015-10-23 DIAGNOSIS — E1169 Type 2 diabetes mellitus with other specified complication: Secondary | ICD-10-CM | POA: Diagnosis not present

## 2015-10-23 DIAGNOSIS — I824Z2 Acute embolism and thrombosis of unspecified deep veins of left distal lower extremity: Secondary | ICD-10-CM | POA: Diagnosis not present

## 2015-10-23 DIAGNOSIS — Z85820 Personal history of malignant melanoma of skin: Secondary | ICD-10-CM | POA: Diagnosis not present

## 2015-10-23 DIAGNOSIS — I428 Other cardiomyopathies: Secondary | ICD-10-CM | POA: Diagnosis not present

## 2015-10-23 DIAGNOSIS — I2510 Atherosclerotic heart disease of native coronary artery without angina pectoris: Secondary | ICD-10-CM | POA: Diagnosis not present

## 2015-10-23 NOTE — Telephone Encounter (Signed)
Initial TCM call - left VM with contact info.

## 2015-10-24 ENCOUNTER — Telehealth: Payer: Self-pay | Admitting: Physical Medicine & Rehabilitation

## 2015-10-24 DIAGNOSIS — C9000 Multiple myeloma not having achieved remission: Secondary | ICD-10-CM | POA: Diagnosis not present

## 2015-10-24 DIAGNOSIS — I82409 Acute embolism and thrombosis of unspecified deep veins of unspecified lower extremity: Secondary | ICD-10-CM | POA: Diagnosis not present

## 2015-10-24 DIAGNOSIS — E119 Type 2 diabetes mellitus without complications: Secondary | ICD-10-CM | POA: Diagnosis not present

## 2015-10-24 DIAGNOSIS — I96 Gangrene, not elsewhere classified: Secondary | ICD-10-CM | POA: Diagnosis not present

## 2015-10-24 NOTE — Telephone Encounter (Signed)
Spoke with patient's daughter [name]. Patient is in a SNF receiving rehabilitation.

## 2015-10-24 NOTE — Telephone Encounter (Signed)
Will be glad to see patient after SNF discharge.  I'll defer for now.  Please notify pt's family that I have been thinking about them.  Thanks.

## 2015-10-24 NOTE — Telephone Encounter (Addendum)
LV on [phone] for patient or care giver to give a call back  1. Are you/is patient experiencing any problems since coming home? Are there any questions regarding any aspect of care? 2. Are there any questions regarding medications administration/dosing? Are med's being taken as prescribed? Patient should review meds with caller to confirm 3. Have there been any falls? 4. Has Home Health been to the house and/or have they contacted you? If not, have you tried to contact them? Can we help you contact them? 5. Are bowels and bladder emptying properly? Are there any unexpected incontinence issues? If applicable, is patient following bowel/bladder programs? 6. Any fevers, problems with breathing, unexpected pain? 7. Are there any skin problems or new areas of breakdown? 8. Has the patient/family member arranged specialty MD follow up (i.e. cardiology/neurology/renal/surgical/etc)?  Can we help arrange? 9. Does the patient need any other services or support that we can help arrange? 10. Are caregivers following through as expected in assisting the patient? Has the patient quit smoking, drinking alcohol, or using drugs as recommended?  Spoke with Gabriel Cirri at [facility] and scheduled a transitional care visit for Mrs. Donaghey on March 2 at 2:00 pm [phone]

## 2015-10-26 ENCOUNTER — Ambulatory Visit: Payer: Medicare Other | Admitting: Family Medicine

## 2015-10-29 DIAGNOSIS — G64 Other disorders of peripheral nervous system: Secondary | ICD-10-CM | POA: Diagnosis not present

## 2015-10-29 DIAGNOSIS — E119 Type 2 diabetes mellitus without complications: Secondary | ICD-10-CM | POA: Diagnosis not present

## 2015-10-29 DIAGNOSIS — I96 Gangrene, not elsewhere classified: Secondary | ICD-10-CM | POA: Diagnosis not present

## 2015-10-29 DIAGNOSIS — I82409 Acute embolism and thrombosis of unspecified deep veins of unspecified lower extremity: Secondary | ICD-10-CM | POA: Diagnosis not present

## 2015-10-30 ENCOUNTER — Encounter: Payer: Self-pay | Admitting: Vascular Surgery

## 2015-11-01 ENCOUNTER — Ambulatory Visit (HOSPITAL_COMMUNITY)
Admission: RE | Admit: 2015-11-01 | Discharge: 2015-11-01 | Disposition: A | Discharge status: Home / Self Care | Payer: Medicare Other | Source: Ambulatory Visit | Attending: Physical Medicine & Rehabilitation | Admitting: Physical Medicine & Rehabilitation

## 2015-11-01 ENCOUNTER — Encounter: Payer: Self-pay | Admitting: Physical Medicine & Rehabilitation

## 2015-11-01 ENCOUNTER — Encounter: Payer: Medicare Other | Attending: Physical Medicine & Rehabilitation | Admitting: Physical Medicine & Rehabilitation

## 2015-11-01 VITALS — BP 163/80 | HR 88 | Temp 102.5°F | Resp 14

## 2015-11-01 DIAGNOSIS — Z86718 Personal history of other venous thrombosis and embolism: Secondary | ICD-10-CM | POA: Insufficient documentation

## 2015-11-01 DIAGNOSIS — E119 Type 2 diabetes mellitus without complications: Secondary | ICD-10-CM | POA: Insufficient documentation

## 2015-11-01 DIAGNOSIS — E785 Hyperlipidemia, unspecified: Secondary | ICD-10-CM | POA: Insufficient documentation

## 2015-11-01 DIAGNOSIS — R011 Cardiac murmur, unspecified: Secondary | ICD-10-CM | POA: Insufficient documentation

## 2015-11-01 DIAGNOSIS — M1990 Unspecified osteoarthritis, unspecified site: Secondary | ICD-10-CM | POA: Diagnosis not present

## 2015-11-01 DIAGNOSIS — T8781 Dehiscence of amputation stump: Secondary | ICD-10-CM | POA: Diagnosis not present

## 2015-11-01 DIAGNOSIS — I10 Essential (primary) hypertension: Secondary | ICD-10-CM | POA: Insufficient documentation

## 2015-11-01 DIAGNOSIS — R509 Fever, unspecified: Secondary | ICD-10-CM | POA: Insufficient documentation

## 2015-11-01 DIAGNOSIS — C9000 Multiple myeloma not having achieved remission: Secondary | ICD-10-CM | POA: Insufficient documentation

## 2015-11-01 DIAGNOSIS — I429 Cardiomyopathy, unspecified: Secondary | ICD-10-CM | POA: Insufficient documentation

## 2015-11-01 DIAGNOSIS — Z89512 Acquired absence of left leg below knee: Secondary | ICD-10-CM | POA: Diagnosis not present

## 2015-11-01 DIAGNOSIS — Z87891 Personal history of nicotine dependence: Secondary | ICD-10-CM | POA: Diagnosis not present

## 2015-11-01 DIAGNOSIS — Z7901 Long term (current) use of anticoagulants: Secondary | ICD-10-CM | POA: Insufficient documentation

## 2015-11-01 DIAGNOSIS — F329 Major depressive disorder, single episode, unspecified: Secondary | ICD-10-CM | POA: Insufficient documentation

## 2015-11-01 DIAGNOSIS — R05 Cough: Secondary | ICD-10-CM | POA: Diagnosis not present

## 2015-11-01 DIAGNOSIS — F419 Anxiety disorder, unspecified: Secondary | ICD-10-CM | POA: Insufficient documentation

## 2015-11-01 MED ORDER — CEPHALEXIN 500 MG PO CAPS: 500.0000 mg | ORAL_CAPSULE | Freq: Two times a day (BID) | ORAL | Status: DC

## 2015-11-01 NOTE — Progress Notes (Addendum)
Subjective:    Patient ID: [name], female    DOB: 1936/01/06, 80 y.o.   MRN: [id number]  Physical Medicine and Rehabilitation Note  Transitional Care Management  Date of service:  11/01/15 Date of Discharge form acute care: 10/22/15  HPI 80 y.o. female comes to Transitional care management clinic after being discharged from acute inpatient rehabilitation unit for comprehensive rehabilitation services. Pt was admitted on 10/11/15 due to left BKA. Pt poor historian.  Pt participated well in rehabilitation and achieved most of his goals and is discharged to SNF due to family inability to care for pt.   She reports that since leaving the hospital she been at a SNF.  She is upset that she has not having her dressing changed and not been receiving her medications in a timely manner, per pt.  She continues to take her coumadin.  The pain is manageable.  She is getting ~1 hour therapy/day.   DME: No new equipment  Pain Inventory Average Pain 6 Pain Right Now 6 My pain is sharp, burning, stabbing, tingling and aching  In the last 24 hours, has pain interfered with the following? General activity 6 Relation with others 5 Enjoyment of life 5 What TIME of day is your pain at its worst? night Sleep (in general) Poor  Pain is worse with: walking, bending and sitting Pain improves with: NA Relief from Meds: NA  Mobility walk with assistance use a walker ability to climb steps?  no do you drive?  no use a wheelchair needs help with transfers transfers alone Do you have any goals in this area?  yes  Function not employed: date last employed NA I need assistance with the following:  dressing, bathing and toileting  Neuro/Psych numbness tingling trouble walking dizziness anxiety loss of taste or smell  Prior Studies Any changes since last visit?  no  Physicians involved in your care Primary care [facility] Oncologist    Family History  Problem Relation Age of Onset  .  Diabetes Mother   . Hypertension Mother   . Arthritis Mother   . Stroke Mother   . Diabetes Father   . Hypertension Father   . Arthritis Father   . Stroke Father   . Cancer Sister     stomach  . Stroke Brother   . Cancer Brother     prostate <50  . Stroke Daughter   . Stroke Son   . Arthritis Other     Parents  . Cancer Other     Colon, 1st degree relative <60  . Diabetes Other     Parents  . Stroke Other     1st degree relative <60   Social History   Social History  . Marital Status: Widowed    Spouse Name: N/A  . Number of Children: N/A  . Years of Education: N/A   Occupational History  . Retired    Social History Main Topics  . Smoking status: Former Smoker -- 1.00 packs/day for 13 years    Quit date: 09/01/1973  . Smokeless tobacco: Never Used     Comment: QIUT MANY YEARS AGO  . Alcohol Use: No  . Drug Use: No  . Sexual Activity: No   Other Topics Concern  . None   Social History Narrative   Regular exercise:  Yes   Past Surgical History  Procedure Laterality Date  . Abdominal hysterectomy    . Ankle fracture surgery Left   . Ear cyst excision  Left 04/16/2015    Procedure: EXCISION FACIAL CYST LEFT SIDE ;  Surgeon: Izora Gala, MD;  Location: [facility];  Service: ENT;  Laterality: Left;  . Peripheral vascular catheterization  08/14/2015    Procedure: Lower Extremity Intervention;  Surgeon: Katha Cabal, MD;  Location: [facility];  Service: Cardiovascular;;  . Peripheral vascular catheterization N/A 08/14/2015    Procedure: Abdominal Aortogram w/Lower Extremity;  Surgeon: Katha Cabal, MD;  Location: [facility];  Service: Cardiovascular;  Laterality: N/A;  . Colonoscopy    . Eye surgery Bilateral     cataract surgery  . Amputation Left 10/09/2015    Procedure: Left Leg AMPUTATION BELOW KNEE;  Surgeon: Angelia Mould, MD;  Location: [facility];  Service: Vascular;  Laterality: Left;   Past Medical History   Diagnosis Date  . Blood transfusion   . Depression     Mild  . History of chicken pox   . Hyperlipidemia   . Hypertension   . Degenerative joint disease   . Phlebitis   . Multiple myeloma     per Dr. Julien Nordmann, s/p palliative radiation for leg pain 2011 per Dr. Sondra Come  . Deep vein thrombosis of bilateral lower extremities (HCC)   . Cardiomyopathy ([place])   . Diabetes mellitus     Steroid related  . UTI (urinary tract infection) 09/08/2013  . History of radiation therapy 08/30/13-09/20/13    20 gray to lower lumbar/upper sacrum  . Heart murmur   . Anxiety   . Anemia   . Family history of anesthesia complication     DAUGHTER CPR AFTER  . History of shingles    BP 163/80 mmHg  Pulse 88  Resp 14  SpO2 99% Temp: 102.5  Opioid Risk Score:   Fall Risk Score:  `1  Depression screen PHQ 2/9  Depression screen PHQ 2/9 11/01/2015  Decreased Interest 0  Down, Depressed, Hopeless 0  PHQ - 2 Score 0   Current Outpatient Prescriptions on File Prior to Visit  Medication Sig Dispense Refill  . acyclovir (ZOVIRAX) 400 MG tablet Take 1 tablet (400 mg total) by mouth 2 (two) times daily. 30 tablet 0  . ALPRAZolam (XANAX) 0.5 MG tablet Take 1 tablet (0.5 mg total) by mouth 2 (two) times daily as needed. for anxiety 60 tablet 0  . amLODipine (NORVASC) 5 MG tablet Take 1 tablet (5 mg total) by mouth daily. 30 tablet 1  . Calcium Carbonate-Vitamin D 600-400 MG-UNIT tablet Take 1 tablet by mouth daily. 30 tablet 1  . clopidogrel (PLAVIX) 75 MG tablet Take 1 tablet (75 mg total) by mouth daily. 30 tablet 0  . glipiZIDE (GLUCOTROL XL) 2.5 MG 24 hr tablet Take 1 tablet (2.5 mg total) by mouth daily. 30 tablet 1  . hydrochlorothiazide (MICROZIDE) 12.5 MG capsule Take 1 capsule (12.5 mg total) by mouth daily. 30 capsule 1  . lisinopril (PRINIVIL,ZESTRIL) 5 MG tablet Take 1 tablet (5 mg total) by mouth daily. 30 tablet 1  . pantoprazole (PROTONIX) 40 MG tablet Take 1 tablet (40 mg total) by mouth  daily. 30 tablet 1  . potassium chloride SA (K-DUR,KLOR-CON) 20 MEQ tablet Take 1 tablet (20 mEq total) by mouth daily. 30 tablet 0  . traMADol (ULTRAM) 50 MG tablet Take 1 tablet (50 mg total) by mouth every 4 (four) hours as needed for moderate pain. 90 tablet 0  . warfarin (COUMADIN) 5 MG tablet Take 1 tablet (5 mg total) by  mouth one time only at 6 PM. 30 tablet 0   Current Facility-Administered Medications on File Prior to Visit  Medication Dose Route Frequency Provider Last Rate Last Dose  . 0.9 %  sodium chloride infusion   Intravenous Once Curt Bears, MD   Stopped at 03/21/15 1720  . 0.9 %  sodium chloride infusion   Intravenous Once Curt Bears, MD      . 0.9 %  sodium chloride infusion   Intravenous Once Curt Bears, MD   Stopped at 07/31/15 1638   Review of Systems  Constitutional: Positive for appetite change.       Loss of taste or smell  Respiratory: Positive for cough.   Gastrointestinal: Positive for diarrhea.  Endocrine:       High blood sugar  Musculoskeletal: Positive for gait problem.  Neurological: Positive for dizziness and numbness.       Tingling   Psychiatric/Behavioral: The patient is nervous/anxious.   All other systems reviewed and are negative.     Objective:   Physical Exam  Gen: Frail appearing. No acute distress. Vital signs reviewed. HENT: Normocephalic, atraumatic.  Eyes: EOMI and and conjunctiva within normal limits. Cardiovascular: Normal rate and regular rhythm. No murmur or rubs Respiratory: Effort normal and breath sounds normal. No respiratory distress.  GI: Soft. Bowel sounds are normal. She exhibits no distension. No pain MSK: Left BKA.  Minimal tenderness.  Minimal edema.  Neurological:  Patient is alert and oriented x3 with increased time and min cues. Follows basic commands and answers simple questions.  Expressive deficits (improved since admission) B/l UE 4/5 proximal distal.  RLE: Hip flexion , knee extension  4+/5, ankle dorsi/plantar flexion 4/5.  LLE: Hip flexion 4-/5 (pain inhibition) Dysarthric speech Skin: BKA with staples, no drainage. Blister decreased in size, however, DTI over patella and ulcer over lateral part of inferior stump.  Stump is not dressed. Psych: pleasant, slow, flat, quiet    Assessment & Plan:  80 y.o. female comes to Transitional care management clinic after being discharged from acute inpatient rehabilitation unit for comprehensive rehabilitation services. Pt was admitted on 10/11/15 due to left BKA.  1. Left BKA  Blister healing, however, wound along posterior lateral aspect.  It is unclear if how often this has been dressed and changed as pt report limited dressing. She also reports that she has not been wearing her stump protector as it has been following.  She has an appointment to see her surgeon next week.    Change dressings daily  Avoid pressure to open wound areas. - Reviewed medication list, no issues/concernes noted or reported. - Reviewed referral needs. - Reviewed Durable Medical Equipment needs and status -Patient education done. All questions and concerns addressed.   2. Pyrexia  Will order CBC  Will order CXR  Will start empiric Keflex  Wound does not appear to be infected, however, encouraged pt to notify physician if increase tenderness, edema, purulent drainage, foul odor, etc.  3. Acute blood anemia  Will order CBC  4. Chronic Coumadin for DVT  Cont coumadin  She states she is having her INR checked at SNF   Updated Note: CBC, relatively unremarkable/improved.  CXR stable

## 2015-11-02 ENCOUNTER — Telehealth: Payer: Self-pay | Admitting: Physical Medicine & Rehabilitation

## 2015-11-02 DIAGNOSIS — I82409 Acute embolism and thrombosis of unspecified deep veins of unspecified lower extremity: Secondary | ICD-10-CM | POA: Diagnosis not present

## 2015-11-02 DIAGNOSIS — E119 Type 2 diabetes mellitus without complications: Secondary | ICD-10-CM | POA: Diagnosis not present

## 2015-11-02 DIAGNOSIS — C9000 Multiple myeloma not having achieved remission: Secondary | ICD-10-CM | POA: Diagnosis not present

## 2015-11-02 DIAGNOSIS — I96 Gangrene, not elsewhere classified: Secondary | ICD-10-CM | POA: Diagnosis not present

## 2015-11-02 LAB — CBC WITH DIFFERENTIAL/PLATELET
BASOS ABS: 0 10*3/uL (ref 0.0–0.2)
BASOS: 0 %
EOS (ABSOLUTE): 0 10*3/uL (ref 0.0–0.4)
Eos: 0 %
Hematocrit: 31.5 % — ABNORMAL LOW (ref 34.0–46.6)
Hemoglobin: 10.3 g/dL — ABNORMAL LOW (ref 11.1–15.9)
IMMATURE GRANULOCYTES: 0 %
Immature Grans (Abs): 0 10*3/uL (ref 0.0–0.1)
Lymphocytes Absolute: 0.5 10*3/uL — ABNORMAL LOW (ref 0.7–3.1)
Lymphs: 7 %
MCH: 29.1 pg (ref 26.6–33.0)
MCHC: 32.7 g/dL (ref 31.5–35.7)
MCV: 89 fL (ref 79–97)
MONOS ABS: 0.9 10*3/uL (ref 0.1–0.9)
Monocytes: 13 %
NEUTROS PCT: 80 %
Neutrophils Absolute: 5.5 10*3/uL (ref 1.4–7.0)
PLATELETS: 275 10*3/uL (ref 150–379)
RBC: 3.54 x10E6/uL — ABNORMAL LOW (ref 3.77–5.28)
RDW: 15.7 % — AB (ref 12.3–15.4)
WBC: 7 10*3/uL (ref 3.4–10.8)

## 2015-11-02 NOTE — Telephone Encounter (Signed)
I called SNF where pt is currently staying and personally spoke to nurse regarding results of CBC and CXR.  Will fax over results.   Also discussed with nurse importance of stump care, management, and protection with emphasis on cleaning and monitoring areas of skin break down.

## 2015-11-05 ENCOUNTER — Ambulatory Visit: Payer: Medicare Other | Admitting: Family Medicine

## 2015-11-06 ENCOUNTER — Telehealth: Payer: Self-pay | Admitting: Vascular Surgery

## 2015-11-06 NOTE — Telephone Encounter (Signed)
Added this note to EPIC regarding daughter's concerns. I will address this with Dr. Scot Dock at tomorrow appt.

## 2015-11-06 NOTE — Telephone Encounter (Signed)
Patients daughter, [name], called in anticipation that she will not be able to be here with her mother at her post op appt tomorrow (pts son will be here).  [name] would like for Dr Scot Dock to look at a small sore below her incision as well as bruising above the knee. The bruising started 1-2 days before she left the hospital. She saw Dr Posey Pronto on 11/01/2015, and was started on an antibiotic d/t having a fever, and the incision feeling warm to the touch. Dr Patel's office note in is EPIC.   [name] may be able to be here, but in the event that she can't make it, she wanted to alert [facility] to the above concerns.  Thank you! Hinton Dyer

## 2015-11-07 ENCOUNTER — Ambulatory Visit (INDEPENDENT_AMBULATORY_CARE_PROVIDER_SITE_OTHER): Payer: Medicare Other | Admitting: Vascular Surgery

## 2015-11-07 ENCOUNTER — Encounter: Payer: Self-pay | Admitting: Vascular Surgery

## 2015-11-07 VITALS — BP 136/82 | HR 71 | Temp 98.1°F | Resp 14 | Ht <= 58 in | Wt 117.0 lb

## 2015-11-07 DIAGNOSIS — E1140 Type 2 diabetes mellitus with diabetic neuropathy, unspecified: Secondary | ICD-10-CM | POA: Diagnosis not present

## 2015-11-07 DIAGNOSIS — Z48812 Encounter for surgical aftercare following surgery on the circulatory system: Secondary | ICD-10-CM

## 2015-11-07 DIAGNOSIS — Z89512 Acquired absence of left leg below knee: Secondary | ICD-10-CM | POA: Diagnosis not present

## 2015-11-07 DIAGNOSIS — I428 Other cardiomyopathies: Secondary | ICD-10-CM | POA: Diagnosis not present

## 2015-11-07 DIAGNOSIS — E784 Other hyperlipidemia: Secondary | ICD-10-CM | POA: Diagnosis not present

## 2015-11-07 DIAGNOSIS — C9000 Multiple myeloma not having achieved remission: Secondary | ICD-10-CM | POA: Diagnosis not present

## 2015-11-07 NOTE — Progress Notes (Signed)
Patient name: [name] MRN: [id number] DOB: [birth year] Sex: female  REASON FOR VISIT: Follow up after left below the knee amputation.  HPI: [name] is a 80 y.o. female who presented with gangrene of the left foot. 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 She returns for a one-month follow up visit. She has no specific complaints. She is at [facility].  Current Outpatient Prescriptions  Medication Sig Dispense Refill  . amLODipine (NORVASC) 5 MG tablet Take 1 tablet (5 mg total) by mouth daily. 30 tablet 1  . Calcium Carbonate-Vitamin D 600-400 MG-UNIT tablet Take 1 tablet by mouth daily. 30 tablet 1  . cephALEXin (KEFLEX) 500 MG capsule Take 1 capsule (500 mg total) by mouth 2 (two) times daily. 14 capsule 0  . clopidogrel (PLAVIX) 75 MG tablet Take 1 tablet (75 mg total) by mouth daily. 30 tablet 0  . glipiZIDE (GLUCOTROL XL) 2.5 MG 24 hr tablet Take 1 tablet (2.5 mg total) by mouth daily. 30 tablet 1  . hydrochlorothiazide (MICROZIDE) 12.5 MG capsule Take 1 capsule (12.5 mg total) by mouth daily. 30 capsule 1  . lisinopril (PRINIVIL,ZESTRIL) 5 MG tablet Take 1 tablet (5 mg total) by mouth daily. 30 tablet 1  . pantoprazole (PROTONIX) 40 MG tablet Take 1 tablet (40 mg total) by mouth daily. 30 tablet 1  . potassium chloride SA (K-DUR,KLOR-CON) 20 MEQ tablet Take 1 tablet (20 mEq total) by mouth daily. 30 tablet 0  . traMADol (ULTRAM) 50 MG tablet Take 1 tablet (50 mg total) by mouth every 4 (four) hours as needed for moderate pain. 90 tablet 0  . warfarin (COUMADIN) 5 MG tablet Take 1 tablet (5 mg total) by mouth one time only at 6 PM. 30 tablet 0  . acyclovir (ZOVIRAX) 400 MG tablet Take 1 tablet (400 mg total) by mouth 2 (two) times daily. 30 tablet 0  . ALPRAZolam (XANAX) 0.5 MG tablet Take 1 tablet (0.5 mg total) by mouth 2 (two) times daily as needed. for anxiety  (Patient not taking: Reported on 11/07/2015) 60 tablet 0   No current facility-administered medications for this visit.   Facility-Administered Medications Ordered in Other Visits  Medication Dose Route Frequency Provider Last Rate Last Dose  . 0.9 %  sodium chloride infusion   Intravenous Once Curt Bears, MD   Stopped at 03/21/15 1720  . 0.9 %  sodium chloride infusion   Intravenous Once Curt Bears, MD      . 0.9 %  sodium chloride infusion   Intravenous Once Curt Bears, MD   Stopped at 07/31/15 1638    REVIEW OF SYSTEMS:  '[X]'$  denotes positive finding, '[ ]'$  denotes negative finding Cardiac  Comments:  Chest pain or chest pressure:    Shortness of breath upon exertion:    Short of breath when lying flat:    Irregular heart rhythm:    Constitutional    Fever or chills:      PHYSICAL EXAM: Filed Vitals:   11/07/15 1358  BP: 136/82  Pulse: 71  Temp: 98.1 F (36.7 C)  Resp: 14  Height: '4\' 9"'$  (1.448 m)  Weight: 117 lb (53.071 kg)  SpO2: 98%    GENERAL: The patient is a well-nourished female, in no acute distress. The vital signs are documented above. CARDIOVASCULAR: There is a regular rate and rhythm. PULMONARY: There is good air exchange bilaterally without wheezing or rales. Her  left BKA has a black eschar on the middle and lateral aspect of the wound but there is no erythema or drainage to suggest infection.  MEDICAL ISSUES:  STATUS POST LEFT BELOW THE KNEE AMPUTATION: We removed her staples in the office today. There is some black eschar that will need to keep a close eye on. I have given the nursing home instructions on dressing changes with 4 x 4's, Kerlix, and a four-inch A's. I think this will gradually heal underneath the eschar and lift the eschar. However, if she develops significant drainage or erythema she could potentially require debridement. Currently,  however it looks fairly superficial.  [name] [facility]  [place]: [phone]

## 2015-11-09 DIAGNOSIS — E119 Type 2 diabetes mellitus without complications: Secondary | ICD-10-CM | POA: Diagnosis not present

## 2015-11-09 DIAGNOSIS — G64 Other disorders of peripheral nervous system: Secondary | ICD-10-CM | POA: Diagnosis not present

## 2015-11-09 DIAGNOSIS — C9000 Multiple myeloma not having achieved remission: Secondary | ICD-10-CM | POA: Diagnosis not present

## 2015-11-09 DIAGNOSIS — I96 Gangrene, not elsewhere classified: Secondary | ICD-10-CM | POA: Diagnosis not present

## 2015-11-12 ENCOUNTER — Telehealth: Payer: Self-pay

## 2015-11-12 NOTE — Telephone Encounter (Signed)
rec'd phone call from nurse practitioner at [facility].  Reported left BKA site has open wound that is deep and appears to have slough present; reported the necrotic area of eschar has fallen off.  Denied fever/ chills.  Reported no signs of erythema.  Requested appt. For eval. Of left BKA open wound.  Appt. Given for 11/14/15 @ 2:45 PM.  Advised to apply NS wet-dry gauze dressing to open wound bid, until evaluated in office on 11/14/15.  Nurse Practitioner stated she will write for the wound care at the nursing home.

## 2015-11-13 ENCOUNTER — Telehealth: Payer: Self-pay | Admitting: Vascular Surgery

## 2015-11-13 ENCOUNTER — Encounter: Payer: Self-pay | Admitting: Vascular Surgery

## 2015-11-13 ENCOUNTER — Ambulatory Visit (INDEPENDENT_AMBULATORY_CARE_PROVIDER_SITE_OTHER): Payer: Medicare Other | Admitting: Vascular Surgery

## 2015-11-13 ENCOUNTER — Telehealth: Payer: Self-pay

## 2015-11-13 ENCOUNTER — Other Ambulatory Visit: Payer: Self-pay

## 2015-11-13 VITALS — BP 118/68 | HR 80 | Temp 97.3°F | Resp 14 | Ht 62.0 in | Wt 108.0 lb

## 2015-11-13 DIAGNOSIS — Z48812 Encounter for surgical aftercare following surgery on the circulatory system: Secondary | ICD-10-CM

## 2015-11-13 NOTE — Telephone Encounter (Signed)
Spoke with pts daughters to schedule, dpm

## 2015-11-13 NOTE — Telephone Encounter (Addendum)
-----   Message from Mena Goes, RN sent at 11/13/2015 10:19 AM EDT ----- Regarding: schedule   ----- Message -----    From: Rosetta Posner, MD    Sent: 11/12/2015   6:29 PM      To: [facility] Charge Pool  I had a call from any keeps daughter. Reports that her below-knee amputation is been separating. This was patient Dr. Scot Dock who had staples out on 11/07/2015. The daughter will call the office tomorrow after 9 AM to arrange for her to see me tomorrow in the office. Does not need any lab studies

## 2015-11-13 NOTE — Telephone Encounter (Signed)
Daughter called to report pt's left BKA stump incision has completely opened up.  Reported she spoke to D.r Early, on call last night and told to bring pt. To office today.  Appt. Given for 1:45 PM today, for wound check.  Phone call to [facility].  Informed of scheduled appt. today at 1:45 PM.  Was told that the Transportation Service, used by [facility], requires 24 hr. advanced notice, and she would not be able to guarantee that the pt. can be transported today.  Stated she will call back to the office to confirm if Transportation can be arranged today.

## 2015-11-13 NOTE — Telephone Encounter (Signed)
Pt's daughter calling for the second time for her mom.  Per daughter: Mom's left leg amputated and is busted where one of the incisions are located. Please call pt's daughter thanks.

## 2015-11-13 NOTE — Progress Notes (Signed)
Patient name: [name] MRN: [id number] DOB: 1936-03-04 Sex: female  REASON FOR VISIT: Follow-up of left below-knee amputation  HPI: [name] is a 80 y.o. female in today for follow-up of left below-knee amputation. She initially had the below-knee amputation on 10/09/2015 with Dr. Scot Dock. She was seen by him on 11/07/2015 in our office. She had her staples removed that time. His note discusses eschar present. [name] daughter called yesterday evening stating that there would been more separation of her wound. She is seen today for further follow-up. Patient reports some discomfort with this. She is able to answer questions.  Current Outpatient Prescriptions  Medication Sig Dispense Refill  . acyclovir (ZOVIRAX) 400 MG tablet Take 1 tablet (400 mg total) by mouth 2 (two) times daily. 30 tablet 0  . ALPRAZolam (XANAX) 0.5 MG tablet Take 1 tablet (0.5 mg total) by mouth 2 (two) times daily as needed. for anxiety 60 tablet 0  . amLODipine (NORVASC) 5 MG tablet Take 1 tablet (5 mg total) by mouth daily. 30 tablet 1  . Calcium Carbonate-Vitamin D 600-400 MG-UNIT tablet Take 1 tablet by mouth daily. 30 tablet 1  . cephALEXin (KEFLEX) 500 MG capsule Take 1 capsule (500 mg total) by mouth 2 (two) times daily. 14 capsule 0  . clopidogrel (PLAVIX) 75 MG tablet Take 1 tablet (75 mg total) by mouth daily. 30 tablet 0  . glipiZIDE (GLUCOTROL XL) 2.5 MG 24 hr tablet Take 1 tablet (2.5 mg total) by mouth daily. 30 tablet 1  . hydrochlorothiazide (MICROZIDE) 12.5 MG capsule Take 1 capsule (12.5 mg total) by mouth daily. 30 capsule 1  . lisinopril (PRINIVIL,ZESTRIL) 5 MG tablet Take 1 tablet (5 mg total) by mouth daily. 30 tablet 1  . pantoprazole (PROTONIX) 40 MG tablet Take 1 tablet (40 mg total) by mouth daily. 30 tablet 1  . potassium chloride SA (K-DUR,KLOR-CON) 20 MEQ tablet Take 1 tablet (20 mEq total) by mouth daily. 30 tablet 0  . traMADol (ULTRAM) 50 MG tablet Take 1 tablet (50 mg  total) by mouth every 4 (four) hours as needed for moderate pain. 90 tablet 0  . warfarin (COUMADIN) 5 MG tablet Take 1 tablet (5 mg total) by mouth one time only at 6 PM. 30 tablet 0   No current facility-administered medications for this visit.   Facility-Administered Medications Ordered in Other Visits  Medication Dose Route Frequency Provider Last Rate Last Dose  . 0.9 %  sodium chloride infusion   Intravenous Once Curt Bears, MD   Stopped at 03/21/15 1720  . 0.9 %  sodium chloride infusion   Intravenous Once Curt Bears, MD      . 0.9 %  sodium chloride infusion   Intravenous Once Curt Bears, MD   Stopped at 07/31/15 1638       PHYSICAL EXAM: Filed Vitals:   11/13/15 1420  BP: 118/68  Pulse: 80  Temp: 97.3 F (36.3 C)  TempSrc: Oral  Resp: 14  Height: '5\' 2"'$  (1.575 m)  Weight: 108 lb (48.988 kg)  SpO2: 96%    GENERAL: The patient is a well-nourished female, in no acute distress. The vital signs are documented above. Patient does have an easily palpable left popliteal pulse. The medial one third of the amputation is healed quite nicely. The lateral two thirds have a large eschar is partially 3 cm wide and extends to the entire lateral edge of the incision. This is separating and there is fat necrosis below  this   MEDICAL ISSUES: Had long discussion with the patient and her daughter present. Explained the options would be debridement and hopeful salvage of below-knee amputation versus conversion to above-knee amputation. With palpable popliteal pulse and feel that there is reasonable chance we could get this to heal. They are not ready to make a decision today. She is on chronic Coumadin and Plavix. Coumadin is for DVT. This is been discontinued today with tentative plans for surgical debridement versus AKA with Dr. Scot Dock on 11/16/2015  Curt Jews [facility]: [phone]

## 2015-11-13 NOTE — Telephone Encounter (Signed)
Rec'd call back from [facility]; transportation has been arranged through the family.  Appt. confirmed at 1:45 PM today, with Dr. Donnetta Hutching.

## 2015-11-13 NOTE — Telephone Encounter (Signed)
Daughter, [name] called back at approx. 10:15 AM; see Triage note.

## 2015-11-14 ENCOUNTER — Telehealth: Payer: Self-pay | Admitting: Medical Oncology

## 2015-11-14 ENCOUNTER — Ambulatory Visit: Payer: Medicare Other | Admitting: Family

## 2015-11-14 ENCOUNTER — Telehealth: Payer: Self-pay

## 2015-11-14 NOTE — Telephone Encounter (Signed)
-----   Message from Curt Bears, MD sent at 11/14/2015  1:12 PM EDT ----- Regarding: RE: ?lovenox bridge No unless Vascular wants her on anticoag as a bridge. ----- Message -----    From: Ardeen Garland, RN    Sent: 11/14/2015  12:55 PM      To: Gregery Na, RN, Curt Bears, MD Subject: ?lovenox bridge                                Pt has vascular procedure Friday 11/16/15 - Plavix and coumadin stopped yesterday and will resume Friday -does she need bridge?  If so please order . thanks

## 2015-11-14 NOTE — Telephone Encounter (Signed)
vascular procedure Friday - Plavix and coumadin stopped yesterday and will resume Friday -does she need bridge? Note to [facility]

## 2015-11-15 ENCOUNTER — Inpatient Hospital Stay: Payer: Medicare Other | Admitting: Physical Medicine & Rehabilitation

## 2015-11-15 ENCOUNTER — Encounter (HOSPITAL_COMMUNITY): Payer: Self-pay | Admitting: *Deleted

## 2015-11-15 MED ORDER — DEXTROSE 5 % IV SOLN
1.5000 g | INTRAVENOUS | Status: AC
  Administered 2015-11-16: 1.5 g via INTRAVENOUS
  Filled 2015-11-15 (×2): qty 1.5

## 2015-11-15 MED ORDER — SODIUM CHLORIDE 0.9 % IV SOLN
INTRAVENOUS | Status: DC
Start: 2015-11-16 — End: 2015-11-16

## 2015-11-15 NOTE — Progress Notes (Signed)
Pt's daughter, Anesia Blackwell returned call for pre-op call. She verified that pt's medications, allergies, medical and surgical history has not changed any since she was here in February. She states pt is in rehab at [facility]. [name] was given pre-op instructions and at this time plans to transport pt here herself. I called [facility] and spoke with pt's nurse, [place]. I gave her pre-op instructions also. When asked what pt's fasting blood sugar has been running, she stated "she not on finger stick". I asked even though she is on Glipizide and she stated "no".

## 2015-11-16 ENCOUNTER — Inpatient Hospital Stay (HOSPITAL_COMMUNITY): Payer: Medicare Other | Admitting: Anesthesiology

## 2015-11-16 ENCOUNTER — Inpatient Hospital Stay (HOSPITAL_COMMUNITY)
Admission: RE | Admit: 2015-11-16 | Discharge: 2015-11-19 | DRG: 464 | Disposition: A | Discharge status: Skilled Nursing Facility | Payer: Medicare Other | Source: Ambulatory Visit | Attending: Vascular Surgery | Admitting: Vascular Surgery

## 2015-11-16 ENCOUNTER — Encounter (HOSPITAL_COMMUNITY)
Admission: RE | Disposition: A | Discharge status: Skilled Nursing Facility | Payer: Self-pay | Source: Ambulatory Visit | Attending: Vascular Surgery

## 2015-11-16 ENCOUNTER — Encounter (HOSPITAL_COMMUNITY): Payer: Self-pay | Admitting: *Deleted

## 2015-11-16 DIAGNOSIS — T8130XA Disruption of wound, unspecified, initial encounter: Secondary | ICD-10-CM | POA: Diagnosis present

## 2015-11-16 DIAGNOSIS — F419 Anxiety disorder, unspecified: Secondary | ICD-10-CM | POA: Diagnosis present

## 2015-11-16 DIAGNOSIS — E785 Hyperlipidemia, unspecified: Secondary | ICD-10-CM | POA: Diagnosis present

## 2015-11-16 DIAGNOSIS — C9001 Multiple myeloma in remission: Secondary | ICD-10-CM | POA: Diagnosis present

## 2015-11-16 DIAGNOSIS — Z7901 Long term (current) use of anticoagulants: Secondary | ICD-10-CM | POA: Diagnosis not present

## 2015-11-16 DIAGNOSIS — T8781 Dehiscence of amputation stump: Principal | ICD-10-CM | POA: Diagnosis present

## 2015-11-16 DIAGNOSIS — E1151 Type 2 diabetes mellitus with diabetic peripheral angiopathy without gangrene: Secondary | ICD-10-CM | POA: Diagnosis not present

## 2015-11-16 DIAGNOSIS — S88019A Complete traumatic amputation at knee level, unspecified lower leg, initial encounter: Secondary | ICD-10-CM | POA: Diagnosis not present

## 2015-11-16 DIAGNOSIS — I429 Cardiomyopathy, unspecified: Secondary | ICD-10-CM | POA: Diagnosis present

## 2015-11-16 DIAGNOSIS — Z86718 Personal history of other venous thrombosis and embolism: Secondary | ICD-10-CM | POA: Diagnosis not present

## 2015-11-16 DIAGNOSIS — L8990 Pressure ulcer of unspecified site, unspecified stage: Secondary | ICD-10-CM | POA: Diagnosis not present

## 2015-11-16 DIAGNOSIS — Z7984 Long term (current) use of oral hypoglycemic drugs: Secondary | ICD-10-CM

## 2015-11-16 DIAGNOSIS — I96 Gangrene, not elsewhere classified: Secondary | ICD-10-CM | POA: Diagnosis not present

## 2015-11-16 DIAGNOSIS — M6281 Muscle weakness (generalized): Secondary | ICD-10-CM | POA: Diagnosis not present

## 2015-11-16 DIAGNOSIS — Z5181 Encounter for therapeutic drug level monitoring: Secondary | ICD-10-CM | POA: Diagnosis not present

## 2015-11-16 DIAGNOSIS — Z89512 Acquired absence of left leg below knee: Secondary | ICD-10-CM | POA: Diagnosis not present

## 2015-11-16 DIAGNOSIS — K219 Gastro-esophageal reflux disease without esophagitis: Secondary | ICD-10-CM | POA: Diagnosis not present

## 2015-11-16 DIAGNOSIS — D649 Anemia, unspecified: Secondary | ICD-10-CM | POA: Diagnosis not present

## 2015-11-16 DIAGNOSIS — Z7902 Long term (current) use of antithrombotics/antiplatelets: Secondary | ICD-10-CM

## 2015-11-16 DIAGNOSIS — G64 Other disorders of peripheral nervous system: Secondary | ICD-10-CM | POA: Diagnosis not present

## 2015-11-16 DIAGNOSIS — Y835 Amputation of limb(s) as the cause of abnormal reaction of the patient, or of later complication, without mention of misadventure at the time of the procedure: Secondary | ICD-10-CM | POA: Diagnosis present

## 2015-11-16 DIAGNOSIS — E784 Other hyperlipidemia: Secondary | ICD-10-CM | POA: Diagnosis not present

## 2015-11-16 DIAGNOSIS — E1140 Type 2 diabetes mellitus with diabetic neuropathy, unspecified: Secondary | ICD-10-CM | POA: Diagnosis not present

## 2015-11-16 DIAGNOSIS — C9000 Multiple myeloma not having achieved remission: Secondary | ICD-10-CM | POA: Diagnosis not present

## 2015-11-16 DIAGNOSIS — E119 Type 2 diabetes mellitus without complications: Secondary | ICD-10-CM | POA: Diagnosis not present

## 2015-11-16 DIAGNOSIS — T879 Unspecified complications of amputation stump: Secondary | ICD-10-CM | POA: Diagnosis not present

## 2015-11-16 DIAGNOSIS — I10 Essential (primary) hypertension: Secondary | ICD-10-CM | POA: Diagnosis not present

## 2015-11-16 DIAGNOSIS — T8189XA Other complications of procedures, not elsewhere classified, initial encounter: Secondary | ICD-10-CM | POA: Diagnosis not present

## 2015-11-16 DIAGNOSIS — D500 Iron deficiency anemia secondary to blood loss (chronic): Secondary | ICD-10-CM | POA: Diagnosis not present

## 2015-11-16 HISTORY — DX: Gastro-esophageal reflux disease without esophagitis: K21.9

## 2015-11-16 HISTORY — PX: APPLICATION OF WOUND VAC: SHX5189

## 2015-11-16 HISTORY — PX: I&D EXTREMITY: SHX5045

## 2015-11-16 LAB — GLUCOSE, CAPILLARY
GLUCOSE-CAPILLARY: 110 mg/dL — AB (ref 65–99)
Glucose-Capillary: 135 mg/dL — ABNORMAL HIGH (ref 65–99)
Glucose-Capillary: 104 mg/dL — ABNORMAL HIGH (ref 65–99)
Glucose-Capillary: 127 mg/dL — ABNORMAL HIGH (ref 65–99)
Glucose-Capillary: 136 mg/dL — ABNORMAL HIGH (ref 65–99)

## 2015-11-16 LAB — COMPREHENSIVE METABOLIC PANEL
ALBUMIN: 3.3 g/dL — AB (ref 3.5–5.0)
ALK PHOS: 73 U/L (ref 38–126)
ALT: 20 U/L (ref 14–54)
AST: 23 U/L (ref 15–41)
Anion gap: 11 (ref 5–15)
BILIRUBIN TOTAL: 0.1 mg/dL — AB (ref 0.3–1.2)
BUN: 11 mg/dL (ref 6–20)
CALCIUM: 9.6 mg/dL (ref 8.9–10.3)
CO2: 25 mmol/L (ref 22–32)
CREATININE: 0.83 mg/dL (ref 0.44–1.00)
Chloride: 103 mmol/L (ref 101–111)
GFR calc Af Amer: >60 mL/min (ref >60)
GFR calc non Af Amer: >60 mL/min (ref >60)
GLUCOSE: 149 mg/dL — AB (ref 65–99)
Potassium: 4.2 mmol/L (ref 3.5–5.1)
SODIUM: 139 mmol/L (ref 135–145)
TOTAL PROTEIN: 6.5 g/dL (ref 6.5–8.1)

## 2015-11-16 LAB — CBC
HEMATOCRIT: 37.4 % (ref 36.0–46.0)
HEMOGLOBIN: 11.4 g/dL — AB (ref 12.0–15.0)
MCH: 27.4 pg (ref 26.0–34.0)
MCHC: 30.5 g/dL (ref 30.0–36.0)
MCV: 89.9 fL (ref 78.0–100.0)
Platelets: 438 10*3/uL — ABNORMAL HIGH (ref 150–400)
RBC: 4.16 MIL/uL (ref 3.87–5.11)
RDW: 15.2 % (ref 11.5–15.5)
WBC: 5 10*3/uL (ref 4.0–10.5)

## 2015-11-16 LAB — PROTIME-INR
INR: 1.21 (ref 0.00–1.49)
INR: 1.16 (ref 0.00–1.49)
Prothrombin Time: 15.5 seconds — ABNORMAL HIGH (ref 11.6–15.2)
Prothrombin Time: 15 s (ref 11.6–15.2)

## 2015-11-16 LAB — SURGICAL PCR SCREEN
MRSA, PCR: NEGATIVE
STAPHYLOCOCCUS AUREUS: NEGATIVE

## 2015-11-16 LAB — APTT: APTT: 28 s (ref 24–37)

## 2015-11-16 SURGERY — IRRIGATION AND DEBRIDEMENT EXTREMITY
Anesthesia: General | Site: Leg Lower | Laterality: Left

## 2015-11-16 MED ORDER — CLOPIDOGREL BISULFATE 75 MG PO TABS
75.0000 mg | ORAL_TABLET | Freq: Every day | ORAL | Status: DC
  Administered 2015-11-16 – 2015-11-19 (×4): 75 mg via ORAL
  Filled 2015-11-16 (×4): qty 1

## 2015-11-16 MED ORDER — TRAMADOL HCL 50 MG PO TABS
50.0000 mg | ORAL_TABLET | ORAL | Status: DC | PRN
  Administered 2015-11-17 (×2): 50 mg via ORAL
  Filled 2015-11-16 (×2): qty 1

## 2015-11-16 MED ORDER — LABETALOL HCL 5 MG/ML IV SOLN
10.0000 mg | INTRAVENOUS | Status: DC | PRN
  Filled 2015-11-16: qty 4

## 2015-11-16 MED ORDER — EPHEDRINE SULFATE 50 MG/ML IJ SOLN
INTRAMUSCULAR | Status: DC | PRN
  Administered 2015-11-16 (×2): 10 mg via INTRAVENOUS
  Administered 2015-11-16: 20 mg via INTRAVENOUS

## 2015-11-16 MED ORDER — PHENYLEPHRINE HCL 10 MG/ML IJ SOLN
INTRAMUSCULAR | Status: DC | PRN
  Administered 2015-11-16 (×2): 80 ug via INTRAVENOUS

## 2015-11-16 MED ORDER — PANTOPRAZOLE SODIUM 40 MG PO TBEC
40.0000 mg | DELAYED_RELEASE_TABLET | Freq: Every day | ORAL | Status: DC
  Administered 2015-11-16 – 2015-11-19 (×4): 40 mg via ORAL
  Filled 2015-11-16 (×4): qty 1

## 2015-11-16 MED ORDER — CEFAZOLIN SODIUM 1-5 GM-% IV SOLN
1.0000 g | Freq: Three times a day (TID) | INTRAVENOUS | Status: DC
  Administered 2015-11-16 – 2015-11-19 (×9): 1 g via INTRAVENOUS
  Filled 2015-11-16 (×11): qty 50

## 2015-11-16 MED ORDER — OXYCODONE HCL 5 MG PO TABS: 5.0000 mg | ORAL_TABLET | Freq: Once | ORAL | Status: DC | PRN

## 2015-11-16 MED ORDER — ALUM & MAG HYDROXIDE-SIMETH 200-200-20 MG/5ML PO SUSP: 15.0000 mL | ORAL | Status: DC | PRN

## 2015-11-16 MED ORDER — HYDRALAZINE HCL 20 MG/ML IJ SOLN: 5.0000 mg | INTRAMUSCULAR | Status: DC | PRN

## 2015-11-16 MED ORDER — CALCIUM CARBONATE-VITAMIN D 500-200 MG-UNIT PO TABS
1.0000 | ORAL_TABLET | Freq: Every day | ORAL | Status: DC
  Administered 2015-11-17 – 2015-11-19 (×3): 1 via ORAL
  Filled 2015-11-16 (×4): qty 1

## 2015-11-16 MED ORDER — FENTANYL CITRATE (PF) 250 MCG/5ML IJ SOLN
INTRAMUSCULAR | Status: AC
  Filled 2015-11-16: qty 5

## 2015-11-16 MED ORDER — CEPHALEXIN 500 MG PO CAPS: 500.0000 mg | ORAL_CAPSULE | Freq: Two times a day (BID) | ORAL | Status: DC

## 2015-11-16 MED ORDER — WARFARIN - PHYSICIAN DOSING INPATIENT: Freq: Every day | Status: DC

## 2015-11-16 MED ORDER — AMLODIPINE BESYLATE 5 MG PO TABS
5.0000 mg | ORAL_TABLET | Freq: Every day | ORAL | Status: DC
  Administered 2015-11-17 – 2015-11-19 (×2): 5 mg via ORAL
  Filled 2015-11-16 (×3): qty 1

## 2015-11-16 MED ORDER — CHLORHEXIDINE GLUCONATE CLOTH 2 % EX PADS: 6.0000 | MEDICATED_PAD | Freq: Once | CUTANEOUS | Status: DC

## 2015-11-16 MED ORDER — ONDANSETRON HCL 4 MG/2ML IJ SOLN
4.0000 mg | Freq: Four times a day (QID) | INTRAMUSCULAR | Status: DC | PRN
  Administered 2015-11-19: 4 mg via INTRAVENOUS
  Filled 2015-11-16: qty 2

## 2015-11-16 MED ORDER — PHENOL 1.4 % MT LIQD
1.0000 | OROMUCOSAL | Status: DC | PRN
Start: 2015-11-16 — End: 2015-11-19

## 2015-11-16 MED ORDER — OXYCODONE HCL 5 MG/5ML PO SOLN: 5.0000 mg | Freq: Once | ORAL | Status: DC | PRN

## 2015-11-16 MED ORDER — LACTATED RINGERS IV SOLN
INTRAVENOUS | Status: DC
  Administered 2015-11-16: 50 mL/h via INTRAVENOUS
  Administered 2015-11-17 – 2015-11-19 (×2): via INTRAVENOUS

## 2015-11-16 MED ORDER — PROPOFOL 10 MG/ML IV BOLUS
INTRAVENOUS | Status: AC
  Filled 2015-11-16: qty 20

## 2015-11-16 MED ORDER — BACITRACIN ZINC 500 UNIT/GM EX OINT
TOPICAL_OINTMENT | CUTANEOUS | Status: AC
  Filled 2015-11-16: qty 28.35

## 2015-11-16 MED ORDER — PANTOPRAZOLE SODIUM 40 MG PO TBEC: 40.0000 mg | DELAYED_RELEASE_TABLET | Freq: Every day | ORAL | Status: DC

## 2015-11-16 MED ORDER — ONDANSETRON HCL 4 MG/2ML IJ SOLN
INTRAMUSCULAR | Status: DC | PRN
  Administered 2015-11-16: 4 mg via INTRAVENOUS

## 2015-11-16 MED ORDER — LISINOPRIL 5 MG PO TABS
5.0000 mg | ORAL_TABLET | Freq: Every day | ORAL | Status: DC
  Administered 2015-11-17 – 2015-11-19 (×2): 5 mg via ORAL
  Filled 2015-11-16 (×3): qty 1

## 2015-11-16 MED ORDER — MUPIROCIN 2 % EX OINT
TOPICAL_OINTMENT | CUTANEOUS | Status: AC
  Filled 2015-11-16: qty 22

## 2015-11-16 MED ORDER — OXYCODONE-ACETAMINOPHEN 5-325 MG PO TABS
1.0000 | ORAL_TABLET | ORAL | Status: DC | PRN
  Administered 2015-11-16 – 2015-11-19 (×8): 2 via ORAL
  Administered 2015-11-19: 1 via ORAL
  Administered 2015-11-19: 2 via ORAL
  Filled 2015-11-16 (×4): qty 2
  Filled 2015-11-16: qty 1
  Filled 2015-11-16 (×6): qty 2

## 2015-11-16 MED ORDER — GLIPIZIDE ER 2.5 MG PO TB24
2.5000 mg | ORAL_TABLET | Freq: Every day | ORAL | Status: DC
  Administered 2015-11-17 – 2015-11-19 (×3): 2.5 mg via ORAL
  Filled 2015-11-16 (×3): qty 1

## 2015-11-16 MED ORDER — ONDANSETRON HCL 4 MG/2ML IJ SOLN: 4.0000 mg | Freq: Once | INTRAMUSCULAR | Status: DC | PRN

## 2015-11-16 MED ORDER — FENTANYL CITRATE (PF) 100 MCG/2ML IJ SOLN
INTRAMUSCULAR | Status: AC
  Administered 2015-11-16: 50 ug via INTRAVENOUS
  Filled 2015-11-16: qty 2

## 2015-11-16 MED ORDER — POTASSIUM CHLORIDE CRYS ER 20 MEQ PO TBCR
20.0000 meq | EXTENDED_RELEASE_TABLET | Freq: Every day | ORAL | Status: DC
  Administered 2015-11-16 – 2015-11-19 (×4): 20 meq via ORAL
  Filled 2015-11-16 (×4): qty 1

## 2015-11-16 MED ORDER — PROPOFOL 10 MG/ML IV BOLUS
INTRAVENOUS | Status: DC | PRN
  Administered 2015-11-16: 100 mg via INTRAVENOUS

## 2015-11-16 MED ORDER — MIDAZOLAM HCL 2 MG/2ML IJ SOLN
INTRAMUSCULAR | Status: AC
  Filled 2015-11-16: qty 2

## 2015-11-16 MED ORDER — HYDROCHLOROTHIAZIDE 12.5 MG PO CAPS
12.5000 mg | ORAL_CAPSULE | Freq: Every day | ORAL | Status: DC
  Administered 2015-11-17 – 2015-11-19 (×2): 12.5 mg via ORAL
  Filled 2015-11-16 (×3): qty 1

## 2015-11-16 MED ORDER — 0.9 % SODIUM CHLORIDE (POUR BTL) OPTIME
TOPICAL | Status: DC | PRN
  Administered 2015-11-16: 1000 mL

## 2015-11-16 MED ORDER — ENOXAPARIN SODIUM 40 MG/0.4ML ~~LOC~~ SOLN
40.0000 mg | SUBCUTANEOUS | Status: DC
  Administered 2015-11-16 – 2015-11-18 (×3): 40 mg via SUBCUTANEOUS
  Filled 2015-11-16 (×3): qty 0.4

## 2015-11-16 MED ORDER — ALPRAZOLAM 0.5 MG PO TABS
0.5000 mg | ORAL_TABLET | Freq: Two times a day (BID) | ORAL | Status: DC | PRN
  Administered 2015-11-16 – 2015-11-17 (×2): 0.5 mg via ORAL
  Filled 2015-11-16 (×2): qty 1

## 2015-11-16 MED ORDER — WARFARIN SODIUM 5 MG PO TABS: 5.0000 mg | ORAL_TABLET | Freq: Once | ORAL | Status: DC

## 2015-11-16 MED ORDER — POTASSIUM CHLORIDE CRYS ER 20 MEQ PO TBCR: 20.0000 meq | EXTENDED_RELEASE_TABLET | Freq: Once | ORAL | Status: DC

## 2015-11-16 MED ORDER — WARFARIN SODIUM 5 MG PO TABS
5.0000 mg | ORAL_TABLET | Freq: Once | ORAL | Status: DC
  Administered 2015-11-16: 5 mg via ORAL
  Filled 2015-11-16: qty 1

## 2015-11-16 MED ORDER — LACTATED RINGERS IV SOLN
INTRAVENOUS | Status: DC | PRN
  Administered 2015-11-16: 15:00:00 via INTRAVENOUS

## 2015-11-16 MED ORDER — LIDOCAINE HCL (CARDIAC) 20 MG/ML IV SOLN
INTRAVENOUS | Status: DC | PRN
  Administered 2015-11-16: 40 mg via INTRAVENOUS

## 2015-11-16 MED ORDER — GUAIFENESIN-DM 100-10 MG/5ML PO SYRP: 15.0000 mL | ORAL_SOLUTION | ORAL | Status: DC | PRN

## 2015-11-16 MED ORDER — MUPIROCIN 2 % EX OINT
1.0000 "application " | TOPICAL_OINTMENT | Freq: Once | CUTANEOUS | Status: AC
  Administered 2015-11-16: 1 via TOPICAL

## 2015-11-16 MED ORDER — ACYCLOVIR 400 MG PO TABS
400.0000 mg | ORAL_TABLET | Freq: Two times a day (BID) | ORAL | Status: DC
  Administered 2015-11-16 – 2015-11-19 (×6): 400 mg via ORAL
  Filled 2015-11-16 (×10): qty 1

## 2015-11-16 MED ORDER — FENTANYL CITRATE (PF) 100 MCG/2ML IJ SOLN
INTRAMUSCULAR | Status: DC | PRN
  Administered 2015-11-16 (×2): 50 ug via INTRAVENOUS

## 2015-11-16 MED ORDER — METOPROLOL TARTRATE 1 MG/ML IV SOLN
2.0000 mg | INTRAVENOUS | Status: DC | PRN
  Filled 2015-11-16: qty 5

## 2015-11-16 MED ORDER — FENTANYL CITRATE (PF) 100 MCG/2ML IJ SOLN
25.0000 ug | INTRAMUSCULAR | Status: DC | PRN
  Administered 2015-11-16 (×2): 50 ug via INTRAVENOUS

## 2015-11-16 SURGICAL SUPPLY — 62 items
BANDAGE ACE 4X5 VEL STRL LF (GAUZE/BANDAGES/DRESSINGS) ×5 IMPLANT
BANDAGE ELASTIC 4 VELCRO ST LF (GAUZE/BANDAGES/DRESSINGS) IMPLANT
BANDAGE ELASTIC 6 VELCRO ST LF (GAUZE/BANDAGES/DRESSINGS) IMPLANT
BANDAGE ESMARK 6X9 LF (GAUZE/BANDAGES/DRESSINGS) IMPLANT
BLADE SAW RECIP 87.9 MT (BLADE) IMPLANT
BNDG COHESIVE 6X5 TAN STRL LF (GAUZE/BANDAGES/DRESSINGS) IMPLANT
BNDG ESMARK 6X9 LF (GAUZE/BANDAGES/DRESSINGS)
BNDG GAUZE ELAST 4 BULKY (GAUZE/BANDAGES/DRESSINGS) ×5 IMPLANT
CANISTER SUCTION 2500CC (MISCELLANEOUS) ×5 IMPLANT
CANISTER WOUND CARE 500ML ATS (WOUND CARE) ×5 IMPLANT
CLIP TI MEDIUM 6 (CLIP) IMPLANT
COVER SURGICAL LIGHT HANDLE (MISCELLANEOUS) ×5 IMPLANT
CUFF TOURNIQUET SINGLE 18IN (TOURNIQUET CUFF) IMPLANT
CUFF TOURNIQUET SINGLE 24IN (TOURNIQUET CUFF) IMPLANT
CUFF TOURNIQUET SINGLE 34IN LL (TOURNIQUET CUFF) IMPLANT
CUFF TOURNIQUET SINGLE 44IN (TOURNIQUET CUFF) IMPLANT
DRAIN CHANNEL 19F RND (DRAIN) IMPLANT
DRAPE INCISE IOBAN 66X45 STRL (DRAPES) IMPLANT
DRAPE INCISE IOBAN 85X60 (DRAPES) ×5 IMPLANT
DRAPE ORTHO SPLIT 77X108 STRL (DRAPES) ×4
DRAPE PROXIMA HALF (DRAPES) ×5 IMPLANT
DRAPE SURG ORHT 6 SPLT 77X108 (DRAPES) ×6 IMPLANT
DRAPE U-SHAPE 47X51 STRL (DRAPES) ×10 IMPLANT
DRSG ADAPTIC 3X8 NADH LF (GAUZE/BANDAGES/DRESSINGS) IMPLANT
DRSG VAC ATS MED SENSATRAC (GAUZE/BANDAGES/DRESSINGS) ×5 IMPLANT
ELECT REM PT RETURN 9FT ADLT (ELECTROSURGICAL) ×5
ELECTRODE REM PT RTRN 9FT ADLT (ELECTROSURGICAL) ×3 IMPLANT
EVACUATOR SILICONE 100CC (DRAIN) IMPLANT
GAUZE SPONGE 4X4 12PLY STRL (GAUZE/BANDAGES/DRESSINGS) IMPLANT
GLOVE BIO SURGEON STRL SZ 6.5 (GLOVE) ×8 IMPLANT
GLOVE BIO SURGEON STRL SZ7.5 (GLOVE) ×5 IMPLANT
GLOVE BIO SURGEONS STRL SZ 6.5 (GLOVE) ×2
GLOVE BIOGEL PI IND STRL 6.5 (GLOVE) ×6 IMPLANT
GLOVE BIOGEL PI IND STRL 8 (GLOVE) ×3 IMPLANT
GLOVE BIOGEL PI INDICATOR 6.5 (GLOVE) ×4
GLOVE BIOGEL PI INDICATOR 8 (GLOVE) ×2
GOWN STRL REUS W/ TWL LRG LVL3 (GOWN DISPOSABLE) ×9 IMPLANT
GOWN STRL REUS W/TWL LRG LVL3 (GOWN DISPOSABLE) ×6
KIT BASIN OR (CUSTOM PROCEDURE TRAY) ×5 IMPLANT
KIT ROOM TURNOVER OR (KITS) ×5 IMPLANT
NS IRRIG 1000ML POUR BTL (IV SOLUTION) ×5 IMPLANT
PACK CV ACCESS (CUSTOM PROCEDURE TRAY) IMPLANT
PACK GENERAL/GYN (CUSTOM PROCEDURE TRAY) ×5 IMPLANT
PAD ARMBOARD 7.5X6 YLW CONV (MISCELLANEOUS) ×10 IMPLANT
PADDING CAST COTTON 6X4 STRL (CAST SUPPLIES) IMPLANT
STAPLER VISISTAT (STAPLE) IMPLANT
STOCKINETTE IMPERVIOUS LG (DRAPES) IMPLANT
SUT ETHILON 3 0 PS 1 (SUTURE) IMPLANT
SUT SILK 0 TIES 10X30 (SUTURE) IMPLANT
SUT SILK 2 0 (SUTURE)
SUT SILK 2 0 SH CR/8 (SUTURE) IMPLANT
SUT SILK 2-0 18XBRD TIE 12 (SUTURE) IMPLANT
SUT SILK 3 0 (SUTURE)
SUT SILK 3-0 18XBRD TIE 12 (SUTURE) IMPLANT
SUT VIC AB 2-0 CT1 18 (SUTURE) IMPLANT
SUT VIC AB 2-0 CTB1 (SUTURE) IMPLANT
SUT VIC AB 3-0 SH 27 (SUTURE)
SUT VIC AB 3-0 SH 27X BRD (SUTURE) IMPLANT
SUT VICRYL 4-0 PS2 18IN ABS (SUTURE) IMPLANT
SWAB COLLECTION DEVICE MRSA (MISCELLANEOUS) ×5 IMPLANT
UNDERPAD 30X30 INCONTINENT (UNDERPADS AND DIAPERS) IMPLANT
WATER STERILE IRR 1000ML POUR (IV SOLUTION) ×5 IMPLANT

## 2015-11-16 NOTE — H&P (View-Only) (Signed)
Patient name: [name] MRN: [id number] DOB: [birth year] Sex: female  REASON FOR VISIT: Follow-up of left below-knee amputation  HPI: [name] is a 80 y.o. female in today for follow-up of left below-knee amputation. She initially had the below-knee amputation on 10/09/2015 with Dr. Scot [name]. She was seen by him on 11/07/2015 in our office. She had her staples removed that time. His note discusses eschar present. [name] daughter called yesterday evening stating that there would been more separation of her wound. She is seen today for further follow-up. Patient reports some discomfort with this. She is able to answer questions.  Current Outpatient Prescriptions  Medication Sig Dispense Refill  . acyclovir (ZOVIRAX) 400 MG tablet Take 1 tablet (400 mg total) by mouth 2 (two) times daily. 30 tablet 0  . ALPRAZolam (XANAX) 0.5 MG tablet Take 1 tablet (0.5 mg total) by mouth 2 (two) times daily as needed. for anxiety 60 tablet 0  . amLODipine (NORVASC) 5 MG tablet Take 1 tablet (5 mg total) by mouth daily. 30 tablet 1  . Calcium Carbonate-Vitamin D 600-400 MG-UNIT tablet Take 1 tablet by mouth daily. 30 tablet 1  . cephALEXin (KEFLEX) 500 MG capsule Take 1 capsule (500 mg total) by mouth 2 (two) times daily. 14 capsule 0  . clopidogrel (PLAVIX) 75 MG tablet Take 1 tablet (75 mg total) by mouth daily. 30 tablet 0  . glipiZIDE (GLUCOTROL XL) 2.5 MG 24 hr tablet Take 1 tablet (2.5 mg total) by mouth daily. 30 tablet 1  . hydrochlorothiazide (MICROZIDE) 12.5 MG capsule Take 1 capsule (12.5 mg total) by mouth daily. 30 capsule 1  . lisinopril (PRINIVIL,ZESTRIL) 5 MG tablet Take 1 tablet (5 mg total) by mouth daily. 30 tablet 1  . pantoprazole (PROTONIX) 40 MG tablet Take 1 tablet (40 mg total) by mouth daily. 30 tablet 1  . potassium chloride SA (K-DUR,KLOR-CON) 20 MEQ tablet Take 1 tablet (20 mEq total) by mouth daily. 30 tablet 0  . traMADol (ULTRAM) 50 MG tablet Take 1 tablet (50 mg  total) by mouth every 4 (four) hours as needed for moderate pain. 90 tablet 0  . warfarin (COUMADIN) 5 MG tablet Take 1 tablet (5 mg total) by mouth one time only at 6 PM. 30 tablet 0   No current facility-administered medications for this visit.   Facility-Administered Medications Ordered in Other Visits  Medication Dose Route Frequency Provider Last Rate Last Dose  . 0.9 %  sodium chloride infusion   Intravenous Once Lindsay Bears, MD   Stopped at 03/21/15 1720  . 0.9 %  sodium chloride infusion   Intravenous Once Lindsay Bears, MD      . 0.9 %  sodium chloride infusion   Intravenous Once Lindsay Bears, MD   Stopped at 07/31/15 1638       PHYSICAL EXAM: Filed Vitals:   11/13/15 1420  BP: 118/68  Pulse: 80  Temp: 97.3 F (36.3 C)  TempSrc: Oral  Resp: 14  Height: '5\' 2"'$  (1.575 m)  Weight: 108 lb (48.988 kg)  SpO2: 96%    GENERAL: The patient is a well-nourished female, in no acute distress. The vital signs are documented above. Patient does have an easily palpable left popliteal pulse. The medial one third of the amputation is healed quite nicely. The lateral two thirds have a large eschar is partially 3 cm wide and extends to the entire lateral edge of the incision. This is separating and there is fat necrosis below  this   MEDICAL ISSUES: Had long discussion with the patient and her daughter present. Explained the options would be debridement and hopeful salvage of below-knee amputation versus conversion to above-knee amputation. With palpable popliteal pulse and feel that there is reasonable chance we could get this to heal. They are not ready to make a decision today. She is on chronic Coumadin and Plavix. Coumadin is for DVT. This is been discontinued today with tentative plans for surgical debridement versus AKA with Dr. Scot [name] on 11/16/2015  [name] [facility]: [phone]

## 2015-11-16 NOTE — Anesthesia Procedure Notes (Signed)
Procedure Name: LMA Insertion Date/Time: 11/16/2015 3:01 PM Performed by: Lance Coon Pre-anesthesia Checklist: Patient identified Patient Re-evaluated:Patient Re-evaluated prior to inductionOxygen Delivery Method: Circle system utilized Preoxygenation: Pre-oxygenation with 100% oxygen Intubation Type: IV induction LMA: LMA inserted LMA Size: 4.0 Number of attempts: 1 Tube secured with: Tape Dental Injury: Teeth and Oropharynx as per pre-operative assessment

## 2015-11-16 NOTE — Op Note (Signed)
    NAME: [name]    MRN: [id number] DOB: Dec 30, 1935    DATE OF OPERATION: 11/16/2015  PREOP DIAGNOSIS: nonhealing left below the knee amputation  POSTOP DIAGNOSIS: same  PROCEDURE: Excisional debridement of left below the knee amputation and placement of VAC  SURGEON: Judeth Cornfield. Scot Dock, MD, FACS  ASSIST: none  ANESTHESIA: Gen.   EBL: minimal  INDICATIONS: [name] is a 80 y.o. female who underwent a left below-the-knee amputation and developed an eschar on the lateral and central portion of the incision. She presents for debridement of the wound.  FINDINGS: The tissue appeared well perfused.  TECHNIQUE: The patient was taken to the operating room and received a general anesthetic. The left below the knee amputation was prepped and draped in the usual sterile fashion. Using Metzenbaum scissors the necrotic skin and eschar were sharply excised. I also debrided subcutaneous tissue. I debridement back to healthy bleeding adipose tissue. The wound was scrubbed. An intraoperative culture was sent. Hemostasis was obtained using electrocautery. I then placed a VAC. The patient tolerated the procedure well and was transferred to the recovery room in stable condition. All needle and sponge counts were correct.  [name], MD, FACS [facility]  DATE OF DICTATION:   11/16/2015

## 2015-11-16 NOTE — Transfer of Care (Signed)
Immediate Anesthesia Transfer of Care Note  Patient: ENIJAH FURR  Procedure(s) Performed: Procedure(s): DEBRIDEMENT OF LEFT  BELOW KNEE AMPUTATION  (Left) APPLICATION OF WOUND VAC LEFT BELOW THE KNEE AMPUTEE. (Left)  Patient Location: PACU  Anesthesia Type:General  Level of Consciousness: awake, alert , oriented and patient cooperative  Airway & Oxygen Therapy: Patient Spontanous Breathing and Patient connected to nasal cannula oxygen  Post-op Assessment: Report given to RN, Post -op Vital signs reviewed and stable and Patient moving all extremities X 4  Post vital signs: Reviewed and stable  Last Vitals:  Filed Vitals:   11/16/15 1140  BP: 169/64  Pulse: 72  Temp: 36.9 C  Resp: 18    Complications: No apparent anesthesia complications

## 2015-11-16 NOTE — Interval H&P Note (Signed)
History and Physical Interval Note:  11/16/2015 2:02 PM  [name]  has presented today for surgery, with the diagnosis of Nonhealing left below knee amputation site T81.89XA  The various methods of treatment have been discussed with the patient and family. After consideration of risks, benefits and other options for treatment, the patient has consented to  Procedure(s): DEBRIDEMENT BELOW KNEE AMPUTATION VERSUS ABOVE KNEE AMPUTATION (Left) AMPUTATION ABOVE KNEE (Left) as a surgical intervention .  [name] history has been reviewed, patient examined, no change in status, stable for surgery.  I have reviewed the patient's chart and labs.  Questions were answered to the patient's satisfaction.     [name]

## 2015-11-16 NOTE — Anesthesia Preprocedure Evaluation (Signed)
Anesthesia Evaluation  Patient identified by MRN, date of birth, ID band Patient awake    Reviewed: Allergy & Precautions, NPO status , Patient's Chart, lab work & pertinent test results  Airway Mallampati: II  TM Distance: >3 FB Neck ROM: Full    Dental  (+) Edentulous Upper, Edentulous Lower   Pulmonary former smoker,    breath sounds clear to auscultation       Cardiovascular hypertension,  Rhythm:Regular Rate:Normal     Neuro/Psych    GI/Hepatic   Endo/Other  diabetes  Renal/GU      Musculoskeletal   Abdominal   Peds  Hematology   Anesthesia Other Findings   Reproductive/Obstetrics                             Anesthesia Physical Anesthesia Plan  ASA: III  Anesthesia Plan: General   Post-op Pain Management:    Induction: Intravenous  Airway Management Planned: LMA  Additional Equipment:   Intra-op Plan:   Post-operative Plan:   Informed Consent: I have reviewed the patients History and Physical, chart, labs and discussed the procedure including the risks, benefits and alternatives for the proposed anesthesia with the patient or authorized representative who has indicated his/her understanding and acceptance.     Plan Discussed with: CRNA and Anesthesiologist  Anesthesia Plan Comments:         Anesthesia Quick Evaluation

## 2015-11-17 LAB — GLUCOSE, CAPILLARY
GLUCOSE-CAPILLARY: 136 mg/dL — AB (ref 65–99)
Glucose-Capillary: 139 mg/dL — ABNORMAL HIGH (ref 65–99)
Glucose-Capillary: 109 mg/dL — ABNORMAL HIGH (ref 65–99)
Glucose-Capillary: 105 mg/dL — ABNORMAL HIGH (ref 65–99)

## 2015-11-17 LAB — PROTIME-INR
INR: 1.26 (ref 0.00–1.49)
PROTHROMBIN TIME: 15.9 s — AB (ref 11.6–15.2)

## 2015-11-17 MED ORDER — WARFARIN SODIUM 5 MG PO TABS
5.0000 mg | ORAL_TABLET | Freq: Every day | ORAL | Status: DC
  Administered 2015-11-17 – 2015-11-18 (×2): 5 mg via ORAL
  Filled 2015-11-17 (×2): qty 1

## 2015-11-17 NOTE — NC FL2 (Signed)
[place] LEVEL OF CARE SCREENING TOOL     IDENTIFICATION  Patient Name: [name] Birthdate: 01/04/1936 Sex: female Admission Date (Current Location): 11/16/2015  [facility] and [place] Number:  Herbalist and Address:  [facility], [place] [address], [place], [place] [address]      Provider Number: [id number]  Attending Physician Name and Address:  Angelia Mould, MD  Relative Name and Phone Number:  Madalynn Pickelsimer  [phone]    Current Level of Care: Hospital Recommended Level of Care: [place] Prior Approval Number:    Date Approved/Denied:   PASRR Number: [id number]  Discharge Plan: SNF    Current Diagnoses: Patient Active Problem List   Diagnosis Date Noted  . Wound dehiscence 11/16/2015  . Anxiety state   . Chronic anticoagulation   . History of DVT (deep vein thrombosis)   . Acute blood loss anemia   . Post-operative pain   . Status post below knee amputation of left lower extremity ([place]) 10/12/2015  . PAD (peripheral artery disease) ([place]) 10/09/2015  . Dry gangrene ([place]) 09/13/2015  . Cellulitis 09/09/2015  . PVD (peripheral vascular disease) ([place]) 09/09/2015  . Encounter for antineoplastic chemotherapy 06/26/2015  . Chronic pain 02/07/2015  . Hypokalemia 11/28/2014  . Long term current use of anticoagulant therapy 11/28/2014  . Fissure in skin of foot 11/28/2014  . Sinus bradycardia 09/08/2014  . DNR no code (do not resuscitate) 09/08/2013  . Chronic steroid use 09/08/2013  . Deep vein thrombosis of left lower extremity ([place]) 05/12/2013  . Deep vein thrombosis of right lower extremity ([place]) 05/12/2013  . Left hip pain 05/16/2012  . Gait instability 05/08/2011  . Multiple myeloma ([place]) 03/29/2010  . Diabetes mellitus without complication ([place]) 30/16/0109  . DEPRESSION, MILD 03/29/2010  . LEG PAIN, RIGHT 03/29/2010  . Hyperlipidemia 05/19/2008  . Essential hypertension  05/19/2008  . Osteoarthritis 05/19/2008    Orientation RESPIRATION BLADDER Height & Weight     Situation, Place, Self  Normal Continent Weight: 108 lb (48.988 kg) Height:  '5\' 2"'$  (157.5 cm)  BEHAVIORAL SYMPTOMS/MOOD NEUROLOGICAL BOWEL NUTRITION STATUS      Continent Diet (Carb Modified)  AMBULATORY STATUS COMMUNICATION OF NEEDS Skin   Limited Assist Verbally Surgical wounds (Incision(Closed)03/17/17LegLeft)                       Personal Care Assistance Level of Assistance  Dressing, Bathing           Functional Limitations Info  Sight Sight Info: Impaired        SPECIAL CARE FACTORS FREQUENCY  PT (By licensed PT), OT (By licensed OT)     PT Frequency: 5 OT Frequency: 5            Contractures      Additional Factors Info  Code Status, Allergies, Insulin Sliding Scale Code Status Info: Fullcode Allergies Info: NKDA           Current Medications (11/17/2015):  This is the current hospital active medication list Current Facility-Administered Medications  Medication Dose Route Frequency Provider Last Rate Last Dose  . acyclovir (ZOVIRAX) tablet 400 mg  400 mg Oral BID Angelia Mould, MD   400 mg at 11/17/15 0849  . ALPRAZolam Duanne Moron) tablet 0.5 mg  0.5 mg Oral BID PRN Angelia Mould, MD   0.5 mg at 11/16/15 2256  . alum & mag hydroxide-simeth (MAALOX/MYLANTA) 200-200-20 MG/5ML suspension 15-30 mL  15-30 mL Oral Q2H PRN Angelia Mould, MD      . amLODipine (NORVASC) tablet 5 mg  5 mg Oral Daily Angelia Mould, MD   5 mg at 11/17/15 0848  . calcium-vitamin D (OSCAL WITH D) 500-200 MG-UNIT per tablet 1 tablet  1 tablet Oral Daily Angelia Mould, MD   1 tablet at 11/17/15 0848  . ceFAZolin (ANCEF) IVPB 1 g/50 mL premix  1 g Intravenous 3 times per day Angelia Mould, MD   1 g at 11/17/15 1330  . clopidogrel (PLAVIX) tablet 75 mg  75 mg Oral Daily Angelia Mould, MD   75 mg at 11/17/15 0848  . enoxaparin  (LOVENOX) injection 40 mg  40 mg Subcutaneous Q24H Angelia Mould, MD   40 mg at 11/16/15 2335  . glipiZIDE (GLUCOTROL XL) 24 hr tablet 2.5 mg  2.5 mg Oral Q breakfast Angelia Mould, MD   2.5 mg at 11/17/15 0849  . guaiFENesin-dextromethorphan (ROBITUSSIN DM) 100-10 MG/5ML syrup 15 mL  15 mL Oral Q4H PRN Angelia Mould, MD      . hydrALAZINE (APRESOLINE) injection 5 mg  5 mg Intravenous Q20 Min PRN Angelia Mould, MD      . hydrochlorothiazide (MICROZIDE) capsule 12.5 mg  12.5 mg Oral Daily Angelia Mould, MD   12.5 mg at 11/17/15 0849  . labetalol (NORMODYNE,TRANDATE) injection 10 mg  10 mg Intravenous Q10 min PRN Angelia Mould, MD      . lactated ringers infusion   Intravenous Continuous Roberts Gaudy, MD 50 mL/hr at 11/16/15 1217 50 mL/hr at 11/16/15 1217  . lisinopril (PRINIVIL,ZESTRIL) tablet 5 mg  5 mg Oral Daily Angelia Mould, MD   5 mg at 11/17/15 0849  . metoprolol (LOPRESSOR) injection 2-5 mg  2-5 mg Intravenous Q2H PRN Angelia Mould, MD      . ondansetron [facility]) injection 4 mg  4 mg Intravenous Q6H PRN Angelia Mould, MD      . oxyCODONE-acetaminophen (PERCOCET/ROXICET) 5-325 MG per tablet 1-2 tablet  1-2 tablet Oral Q4H PRN Angelia Mould, MD   2 tablet at 11/17/15 1411  . pantoprazole (PROTONIX) EC tablet 40 mg  40 mg Oral Daily Angelia Mould, MD   40 mg at 11/17/15 0849  . phenol (CHLORASEPTIC) mouth spray 1 spray  1 spray Mouth/Throat PRN Angelia Mould, MD      . potassium chloride SA (K-DUR,KLOR-CON) CR tablet 20 mEq  20 mEq Oral Daily Angelia Mould, MD   20 mEq at 11/17/15 0849  . potassium chloride SA (K-DUR,KLOR-CON) CR tablet 20-40 mEq  20-40 mEq Oral Once Angelia Mould, MD   20 mEq at 11/16/15 2334  . traMADol (ULTRAM) tablet 50 mg  50 mg Oral Q4H PRN Angelia Mould, MD      . warfarin (COUMADIN) tablet 5 mg  5 mg Oral q1800 Angelia Mould, MD      . Warfarin  - Physician Dosing Inpatient   Does not apply [id number] Angelia Mould, MD       Facility-Administered Medications Ordered in Other Encounters  Medication Dose Route Frequency Provider Last Rate Last Dose  . 0.9 %  sodium chloride infusion   Intravenous Once Curt Bears, MD   Stopped at 03/21/15 1720  . 0.9 %  sodium chloride infusion   Intravenous Once Curt Bears, MD      . 0.9 %  sodium chloride infusion  Intravenous Once Curt Bears, MD   Stopped at 07/31/15 1638     Discharge Medications: Please see discharge summary for a list of discharge medications.  Relevant Imaging Results:  Relevant Lab Results:   Additional Information SS# [id number]   PT'S CHEMOTHERAPY ON HOLD [facility]  Standley Brooking, [place]

## 2015-11-17 NOTE — Clinical Social Work Note (Signed)
Clinical Social Work Assessment  Patient Details  Name: [name] MRN: [id number] Date of Birth: 04-06-1936  Date of referral:  11/17/15               Reason for consult:  Facility Placement                Permission sought to share information with:  Facility Art therapist granted to share information::  Yes, Verbal Permission Granted  Name::        Agency::     Relationship::     Contact Information:     Housing/Transportation Living arrangements for the past 2 months:  [place] of Information:  Patient Patient Interpreter Needed:  None Criminal Activity/Legal Involvement Pertinent to Current Situation/Hospitalization:  No - Comment as needed Significant Relationships:  Adult Children Lives with:  Facility Resident Do you feel safe going back to the place where you live?  Yes Need for family participation in patient care:  No (Coment)  Care giving concerns:  CSW received consult that patient was admitted from [facility].    Social Worker assessment / plan:  CSW confirmed with patient that she plans to return to [facility] once discharged from the hospital. [place] also confirmed with [name] at [facility] that they would be able to take patient back with wound vac. Anticipating discharge Monday per CSW consult order.   Employment status:  Retired Nurse, adult PT Recommendations:  [place] / Referral to community resources:  [place]  Patient/Family's Response to care:  Patient is agreeable with returning to [place].   Patient/Family's Understanding of and Emotional Response to Diagnosis, Current Treatment, and Prognosis:    Emotional Assessment Appearance:    Attitude/Demeanor/Rapport:    Affect (typically observed):    Orientation:  Oriented to Self, Oriented to Place, Oriented to  Time, Oriented to Situation Alcohol / Substance use:    Psych  involvement (Current and /or in the community):     Discharge Needs  Concerns to be addressed:    Readmission within the last 30 days:    Current discharge risk:    Barriers to Discharge:      Standley Brooking, LCSW 11/17/2015, 3:26 PM

## 2015-11-17 NOTE — Progress Notes (Signed)
   VASCULAR SURGERY ASSESSMENT & PLAN:  * 1 Day Post-Op s/p: Debridement of left BKA and placement of VAC  *  VAC change M-W-F.  * Back to SNF once VAC approved. Wound (2 cm width - 10 cm length - 0.5 cm depth)  SUBJECTIVE: No complaints.   PHYSICAL EXAM: Filed Vitals:   11/16/15 1755 11/16/15 2050 11/17/15 0152 11/17/15 0617  BP: 136/61 136/59 113/44 116/42  Pulse: 75 76 75 79  Temp: 98.7 F (37.1 C) 98.6 F (37 C) 98.9 F (37.2 C) 98.7 F (37.1 C)  TempSrc:  Oral Oral Oral  Resp: '18 18 18 18  '$ Height:      Weight:      SpO2: 100% 100% 100% 100%   VAC with good seal.  LABS: Lab Results  Component Value Date   WBC 5.0 11/16/2015   HGB 11.4* 11/16/2015   HCT 37.4 11/16/2015   MCV 89.9 11/16/2015   PLT 438* 11/16/2015   Lab Results  Component Value Date   CREATININE 0.83 11/16/2015   CBG (last 3)   Recent Labs  11/16/15 1833 11/16/15 2107 11/17/15 0616  GLUCAP 110* 136* 139*    Active Problems:   Wound dehiscence  Gae Gallop Beeper: [phone] 11/17/2015

## 2015-11-18 LAB — PROTIME-INR
INR: 1.32 (ref 0.00–1.49)
Prothrombin Time: 16.5 seconds — ABNORMAL HIGH (ref 11.6–15.2)

## 2015-11-18 LAB — GLUCOSE, CAPILLARY
GLUCOSE-CAPILLARY: 132 mg/dL — AB (ref 65–99)
GLUCOSE-CAPILLARY: 124 mg/dL — AB (ref 65–99)
Glucose-Capillary: 153 mg/dL — ABNORMAL HIGH (ref 65–99)
Glucose-Capillary: 89 mg/dL (ref 65–99)

## 2015-11-18 NOTE — Progress Notes (Signed)
   VASCULAR SURGERY ASSESSMENT & PLAN:  * 2 Days Post-Op s/p: Excisional debridement of left below the knee amputation and placement of VAC  *  VAC change tomorrow.  SUBJECTIVE: No complaints.  PHYSICAL EXAM: Filed Vitals:   11/17/15 0846 11/17/15 1430 11/17/15 1954 11/18/15 0554  BP: 113/43 125/53 117/53 103/46  Pulse: 80 81 73 73  Temp:  99.4 F (37.4 C) 99.2 F (37.3 C) 98.6 F (37 C)  TempSrc:  Oral Oral Oral  Resp: '18 18 18 18  '$ Height:      Weight:      SpO2: 98% 100% 100% 93%   VAC with good seal.  CBG (last 3)   Recent Labs  11/17/15 1710 11/17/15 2104 11/18/15 0647  GLUCAP 105* 136* 132*    Active Problems:   Wound dehiscence  Gae Gallop Beeper: [phone] 11/18/2015

## 2015-11-19 ENCOUNTER — Encounter (HOSPITAL_COMMUNITY): Payer: Self-pay | Admitting: Vascular Surgery

## 2015-11-19 DIAGNOSIS — M79651 Pain in right thigh: Secondary | ICD-10-CM | POA: Diagnosis not present

## 2015-11-19 DIAGNOSIS — L8990 Pressure ulcer of unspecified site, unspecified stage: Secondary | ICD-10-CM | POA: Diagnosis not present

## 2015-11-19 DIAGNOSIS — I96 Gangrene, not elsewhere classified: Secondary | ICD-10-CM | POA: Diagnosis not present

## 2015-11-19 DIAGNOSIS — S88019A Complete traumatic amputation at knee level, unspecified lower leg, initial encounter: Secondary | ICD-10-CM | POA: Diagnosis not present

## 2015-11-19 DIAGNOSIS — E1140 Type 2 diabetes mellitus with diabetic neuropathy, unspecified: Secondary | ICD-10-CM | POA: Diagnosis not present

## 2015-11-19 DIAGNOSIS — Z89512 Acquired absence of left leg below knee: Secondary | ICD-10-CM | POA: Diagnosis not present

## 2015-11-19 DIAGNOSIS — R627 Adult failure to thrive: Secondary | ICD-10-CM | POA: Diagnosis not present

## 2015-11-19 DIAGNOSIS — E784 Other hyperlipidemia: Secondary | ICD-10-CM | POA: Diagnosis not present

## 2015-11-19 DIAGNOSIS — Z5181 Encounter for therapeutic drug level monitoring: Secondary | ICD-10-CM | POA: Diagnosis not present

## 2015-11-19 DIAGNOSIS — C9000 Multiple myeloma not having achieved remission: Secondary | ICD-10-CM | POA: Diagnosis not present

## 2015-11-19 DIAGNOSIS — Z4789 Encounter for other orthopedic aftercare: Secondary | ICD-10-CM | POA: Diagnosis not present

## 2015-11-19 DIAGNOSIS — E119 Type 2 diabetes mellitus without complications: Secondary | ICD-10-CM | POA: Diagnosis not present

## 2015-11-19 DIAGNOSIS — D649 Anemia, unspecified: Secondary | ICD-10-CM | POA: Diagnosis not present

## 2015-11-19 DIAGNOSIS — M25551 Pain in right hip: Secondary | ICD-10-CM | POA: Diagnosis not present

## 2015-11-19 DIAGNOSIS — G64 Other disorders of peripheral nervous system: Secondary | ICD-10-CM | POA: Diagnosis not present

## 2015-11-19 DIAGNOSIS — M6281 Muscle weakness (generalized): Secondary | ICD-10-CM | POA: Diagnosis not present

## 2015-11-19 DIAGNOSIS — Z89522 Acquired absence of left knee: Secondary | ICD-10-CM | POA: Diagnosis not present

## 2015-11-19 DIAGNOSIS — D500 Iron deficiency anemia secondary to blood loss (chronic): Secondary | ICD-10-CM | POA: Diagnosis not present

## 2015-11-19 DIAGNOSIS — M25559 Pain in unspecified hip: Secondary | ICD-10-CM | POA: Diagnosis not present

## 2015-11-19 DIAGNOSIS — I10 Essential (primary) hypertension: Secondary | ICD-10-CM | POA: Diagnosis not present

## 2015-11-19 LAB — PROTIME-INR
INR: 1.49 (ref 0.00–1.49)
Prothrombin Time: 18.1 seconds — ABNORMAL HIGH (ref 11.6–15.2)

## 2015-11-19 LAB — WOUND CULTURE

## 2015-11-19 LAB — GLUCOSE, CAPILLARY
GLUCOSE-CAPILLARY: 139 mg/dL — AB (ref 65–99)
GLUCOSE-CAPILLARY: 145 mg/dL — AB (ref 65–99)
GLUCOSE-CAPILLARY: 72 mg/dL (ref 65–99)

## 2015-11-19 MED ORDER — TRAMADOL HCL 50 MG PO TABS: 50.0000 mg | ORAL_TABLET | ORAL | Status: DC | PRN

## 2015-11-19 NOTE — Clinical Social Work Note (Signed)
CSW met with patient to discuss discharge planning back to [facility].  Patient stated she would like to return back to SNF, CSW contacted [facility] who said they are able to take patient back today.  Patient's son was at bedside, CSW also attempted to contact patient's daughters [name] and Maudie Mercury, but there was no answer.  CSW left voice mail on both phones informing them that patient will be discharging back to [facility] today.  Patient to be d/c'ed today to [facility].  Patient and family agreeable to plans will transport via ems RN to call report to [phone] report to [phone].  Jones Broom. [place], MSW, [place] 11/19/2015 4:54 PM

## 2015-11-19 NOTE — Progress Notes (Signed)
Wound Vac changed and continuous @ 125 mm/hg suction Wound Measurement ( Length - 10 cm, Width - 2 cm, Depth - 0.5 cm )

## 2015-11-19 NOTE — Care Management Important Message (Signed)
Important Message  Patient Details  Name: [name] MRN: [id number] Date of Birth: 03-Aug-1936   Medicare Important Message Given:  Yes    [name] 11/19/2015, 3:57 PM

## 2015-11-19 NOTE — Progress Notes (Signed)
Reviewed discharge paperwork with patient. Also called report to [facility]. Ilene Qua 5:50 PM 11/19/2015

## 2015-11-19 NOTE — Evaluation (Signed)
Physical Therapy Evaluation Patient Details Name: [name] MRN: [id number] DOB: 09/04/1935 Today's Date: 11/19/2015   History of Present Illness  Admitted with wound dehiscence from BKA done approx 1 month ago; now s/p I&D PMH:  HTN, multiple myeloma, DM.  Clinical Impression   Patient is s/p above surgery resulting in functional limitations due to the deficits listed below (see PT Problem List).  Patient will benefit from skilled PT to increase their independence and safety with mobility to allow discharge to the venue listed below.       Follow Up Recommendations SNF (back to SNF for continuing rehab)    Equipment Recommendations  Rolling walker with 5" wheels;3in1 (PT) (wheelchair to be considered at SNF)    Recommendations for Other Services       Precautions / Restrictions Precautions Precautions: Fall      Mobility  Bed Mobility Overal bed mobility: Needs Assistance Bed Mobility: Supine to Sit     Supine to sit: Mod assist     General bed mobility comments: Mod assist to elevate trunk and to square off hips sitting up at EOB  Transfers Overall transfer level: Needs assistance Equipment used: 1 person hand held assist Transfers: Stand Pivot Transfers   Stand pivot transfers: Min assist       General transfer comment: Good rise on RLE; assist to shift hips around to to chair in prep for sitting; decr control with stand to sit  Ambulation/Gait                Stairs            Wheelchair Mobility    Modified Rankin (Stroke Patients Only)       Balance                                             Pertinent Vitals/Pain Pain Assessment: Faces Faces Pain Scale: Hurts little more Pain Location: L residual limb Pain Descriptors / Indicators: Aching;Grimacing;Guarding Pain Intervention(s): Limited activity within patient's tolerance;Monitored during session;Repositioned    Home Living Family/patient expects to be  discharged to:: Private residence Living Arrangements: Children Available Help at Discharge: Family;Available 24 hours/day;Personal care attendant Type of Home: House Home Access: Ramped entrance     Home Layout: One level Home Equipment: Walker - 2 wheels;Bedside commode Additional Comments: son is w/c bound, daughter Andrea is looking into 24 hour care at home    Prior Function Level of Independence: Needs assistance   Gait / Transfers Assistance Needed: Walking household distances with RW.  ADL's / Homemaking Assistance Needed: Sponge bathed, dressed, self fed, toileted modified independently. Performed light housekeeping and laundry, Son did houskeeping  Comments: family helped with medications     Hand Dominance   Dominant Hand: Right    Extremity/Trunk Assessment   Upper Extremity Assessment: Generalized weakness           Lower Extremity Assessment: Generalized weakness;LLE deficits/detail   LLE Deficits / Details: Able to fully extend L knee; hip ROM WFL  Cervical / Trunk Assessment: Normal  Communication   Communication: HOH  Cognition Arousal/Alertness: Awake/alert Behavior During Therapy: WFL for tasks assessed/performed Overall Cognitive Status: Within Functional Limits for tasks assessed                      General Comments      Exercises          Assessment/Plan    PT Assessment Patient needs continued PT services  PT Diagnosis Acute pain;Generalized weakness   PT Problem List Decreased strength;Decreased range of motion;Decreased activity tolerance;Decreased balance;Decreased mobility;Decreased coordination;Decreased knowledge of use of DME;Pain;Decreased knowledge of precautions  PT Treatment Interventions DME instruction;Gait training;Functional mobility training;Therapeutic activities;Therapeutic exercise;Patient/family education   PT Goals (Current goals can be found in the Care Plan section) Acute Rehab PT Goals Patient Stated  Goal: wants to get back to her rehab at SNF PT Goal Formulation: With patient Time For Goal Achievement: 12/03/15 Potential to Achieve Goals: Good    Frequency Min 3X/week   Barriers to discharge        Co-evaluation               End of Session Equipment Utilized During Treatment: Gait belt Activity Tolerance: Patient tolerated treatment well Patient left: in chair;with call bell/phone within reach Nurse Communication: Mobility status         Time: 1459-1512 PT Time Calculation (min) (ACUTE ONLY): 13 min   Charges:   PT Evaluation $PT Eval Moderate Complexity: 1 Procedure     PT G Codes:        ,  [place] 11/19/2015, 4:25 PM   , PT  Acute Rehabilitation Services Pager [phone] Office [phone]  

## 2015-11-19 NOTE — Progress Notes (Signed)
   VASCULAR SURGERY ASSESSMENT & PLAN:  * 3 Days Post-Op s/p: Excisional debridement of left below the knee amputation site and placement of VAC.  *  The wound today looks good and the plan will be to [facility] the [facility].  * transfer back to skilled nursing facility once [facility] approved.  SUBJECTIVE: No complaints.  PHYSICAL EXAM: Filed Vitals:   11/17/15 1954 11/18/15 0554 11/18/15 2115 11/19/15 0610  BP: 117/53 103/46 143/73 145/67  Pulse: 73 73 71 68  Temp: 99.2 F (37.3 C) 98.6 F (37 C) 99 F (37.2 C) 98.9 F (37.2 C)  TempSrc: Oral Oral Oral Oral  Resp: '18 18 18 18  '$ Height:      Weight:      SpO2: 100% 93% 100% 100%   VAC removed. Wound looks excellent with good granulation tissue.  LABS: Lab Results  Component Value Date   WBC 5.0 11/16/2015   HGB 11.4* 11/16/2015   HCT 37.4 11/16/2015   MCV 89.9 11/16/2015   PLT 438* 11/16/2015   Lab Results  Component Value Date   CREATININE 0.83 11/16/2015   Lab Results  Component Value Date   INR 1.49 11/19/2015   PROTIME 16.8* 09/04/2015   CBG (last 3)   Recent Labs  11/18/15 1639 11/18/15 2114 11/19/15 0607  GLUCAP 124* 89 139*    Active Problems:   Wound dehiscence    Gae Gallop Beeper: [phone] 11/19/2015

## 2015-11-19 NOTE — Discharge Summary (Signed)
[facility] Discharge Summary  [name] [birth year] 80 y.o. female  [id number]  Admission Date: 11/16/2015  Discharge Date: 11/19/2015  Physician: Angelia Mould, MD  Admission Diagnosis: Nonhealing left below knee amputation site T81.89XA  HPI:   This is a 80 y.o. female who presented for follow-up of left below-knee amputation. She initially had the below-knee amputation on 10/09/2015 with Dr. Scot Dock. She was seen by him on 11/07/2015 in our office. She had her staples removed that time. His note discusses eschar present. [name] daughter called yesterday evening stating that there would been more separation of her wound. She is seen today for further follow-up. Patient reports some discomfort with this. She is able to answer questions.  Hospital Course:  The patient was admitted to the hospital and taken to the operating room on 11/16/2015 and underwent: Excisional debridement of left below the knee amputation and placement of VAC  The patient tolerated the procedure well and was transported to the PACU in stable condition.   The patient had an unremarkable post-operative course. Her VAC dressing was taken down on POD 3. Her wound was healing well with good granulation tissue. She was discharged back to her SNF on POD 3 in good condition.    CBC    Component Value Date/Time   WBC 5.0 11/16/2015 1206   WBC 7.0 11/01/2015 0000   WBC 7.6 09/04/2015 1212   RBC 4.16 11/16/2015 1206   RBC 3.54* 11/01/2015 0000   RBC 3.54* 09/04/2015 1212   RBC 3.45* 06/09/2008 1340   HGB 11.4* 11/16/2015 1206   HGB 11.1* 09/04/2015 1212   HCT 37.4 11/16/2015 1206   HCT 31.5* 11/01/2015 0000   HCT 34.6* 09/04/2015 1212   PLT 438* 11/16/2015 1206   PLT 275 11/01/2015 0000   PLT 165 09/04/2015 1212   MCV 89.9 11/16/2015 1206   MCV 89 11/01/2015 0000   MCV 97.6 09/04/2015 1212   MCH 27.4 11/16/2015 1206   MCH 29.1 11/01/2015 0000   MCH 31.4 09/04/2015  1212   MCHC 30.5 11/16/2015 1206   MCHC 32.7 11/01/2015 0000   MCHC 32.1 09/04/2015 1212   RDW 15.2 11/16/2015 1206   RDW 15.7* 11/01/2015 0000   RDW 15.9* 09/04/2015 1212   LYMPHSABS 0.5* 11/01/2015 0000   LYMPHSABS 0.5* 10/21/2015 0534   LYMPHSABS 0.2* 09/04/2015 1212   MONOABS 0.2 10/21/2015 0534   MONOABS 0.1 09/04/2015 1212   EOSABS 0.0 11/01/2015 0000   EOSABS 0.1 10/21/2015 0534   EOSABS 0.0 09/04/2015 1212   BASOSABS 0.0 11/01/2015 0000   BASOSABS 0.0 10/21/2015 0534   BASOSABS 0.0 09/04/2015 1212    BMET    Component Value Date/Time   NA 139 11/16/2015 1206   NA 140 09/04/2015 1212   K 4.2 11/16/2015 1206   K 4.2 09/04/2015 1212   CL 103 11/16/2015 1206   CL 107 02/14/2013 1311   CO2 25 11/16/2015 1206   CO2 22 09/04/2015 1212   GLUCOSE 149* 11/16/2015 1206   GLUCOSE 187* 09/04/2015 1212   GLUCOSE 202* 02/14/2013 1311   BUN 11 11/16/2015 1206   BUN 13.7 09/04/2015 1212   CREATININE 0.83 11/16/2015 1206   CREATININE 1.07* 09/21/2015 1606   CREATININE 0.9 09/04/2015 1212   CALCIUM 9.6 11/16/2015 1206   CALCIUM 9.9 09/04/2015 1212   CALCIUM * 06/13/2008 1605    5.7 CRITICAL RESULT CALLED TO, READ BACK BY AND VERIFIED WITH: JAMIE TRACY,RN [id number] @ [id number] (  NOTE)  Amended report. Result repeated and verified. CORRECTED ON 10/14 AT 1429: PREVIOUSLY REPORTED AS Result repeated and verified.   GFRNONAA >60 11/16/2015 1206   GFRAA >60 11/16/2015 1206     Discharge Instructions:   The patient is discharged to SNF with extensive instructions on wound care and progressive ambulation.  They are instructed not to drive or perform any heavy lifting until returning to see the physician in his office.  Discharge Instructions    Call MD for:  redness, tenderness, or signs of infection (pain, swelling, bleeding, redness, odor or green/yellow discharge around incision site)    Complete by:  As directed      Call MD for:  severe or increased pain, loss or  decreased feeling  in affected limb(s)    Complete by:  As directed      Call MD for:  temperature >100.5    Complete by:  As directed      Discharge wound care:    Complete by:  As directed   VAC dressing changes every MWF with foam. Continuous suction 125 mmHg.     Driving Restrictions    Complete by:  As directed   No driving for 4 weeks     Increase activity slowly    Complete by:  As directed   Walk with assistance use walker or cane as needed     Lifting restrictions    Complete by:  As directed   No lifting for 2 weeks     Resume previous diet    Complete by:  As directed            Discharge Diagnosis:  Nonhealing left below knee amputation site T81.89XA  Secondary Diagnosis: Patient Active Problem List   Diagnosis Date Noted  . Wound dehiscence 11/16/2015  . Anxiety state   . Chronic anticoagulation   . History of DVT (deep vein thrombosis)   . Acute blood loss anemia   . Post-operative pain   . Status post below knee amputation of left lower extremity ([place]) 10/12/2015  . PAD (peripheral artery disease) ([place]) 10/09/2015  . Dry gangrene ([place]) 09/13/2015  . Cellulitis 09/09/2015  . PVD (peripheral vascular disease) ([place]) 09/09/2015  . Encounter for antineoplastic chemotherapy 06/26/2015  . Chronic pain 02/07/2015  . Hypokalemia 11/28/2014  . Long term current use of anticoagulant therapy 11/28/2014  . Fissure in skin of foot 11/28/2014  . Sinus bradycardia 09/08/2014  . DNR no code (do not resuscitate) 09/08/2013  . Chronic steroid use 09/08/2013  . Deep vein thrombosis of left lower extremity ([place]) 05/12/2013  . Deep vein thrombosis of right lower extremity ([place]) 05/12/2013  . Left hip pain 05/16/2012  . Gait instability 05/08/2011  . Multiple myeloma ([place]) 03/29/2010  . Diabetes mellitus without complication ([place]) [id number]/41/6384  . DEPRESSION, MILD 03/29/2010  . LEG PAIN, RIGHT 03/29/2010  . Hyperlipidemia 05/19/2008  . Essential hypertension 05/19/2008    . Osteoarthritis 05/19/2008   Past Medical History  Diagnosis Date  . Blood transfusion   . Depression     Mild  . History of chicken pox   . Hyperlipidemia   . Degenerative joint disease   . Phlebitis   . Multiple myeloma     per Dr. Julien Nordmann, s/p palliative radiation for leg pain 2011 per Dr. Sondra Come  . Deep vein thrombosis of bilateral lower extremities (H[facility])   . Cardiomyopathy ([place])   . UTI (urinary tract infection) 09/08/2013  . History of radiation therapy 08/30/13-09/20/13  20 gray to lower lumbar/upper sacrum  . Heart murmur   . Anemia   . Family history of anesthesia complication     DAUGHTER CPR AFTER  . History of shingles   . GERD (gastroesophageal reflux disease)     takes Protonix daily  . Diabetes mellitus     takes Glipizide daily  . Hypertension     takes Amlodipine,HCTZ,and Lisinopril daily  . Anxiety     takes Xanax daily as needed       Medication List    STOP taking these medications        cephALEXin 500 MG capsule  Commonly known as:  KEFLEX      TAKE these medications        acyclovir 400 MG tablet  Commonly known as:  ZOVIRAX  Take 1 tablet (400 mg total) by mouth 2 (two) times daily.     ALPRAZolam 0.5 MG tablet  Commonly known as:  XANAX  Take 1 tablet (0.5 mg total) by mouth 2 (two) times daily as needed. for anxiety     amLODipine 5 MG tablet  Commonly known as:  NORVASC  Take 1 tablet (5 mg total) by mouth daily.     Calcium Carbonate-Vitamin D 600-400 MG-UNIT tablet  Take 1 tablet by mouth daily.     clopidogrel 75 MG tablet  Commonly known as:  PLAVIX  Take 1 tablet (75 mg total) by mouth daily.     glipiZIDE 2.5 MG 24 hr tablet  Commonly known as:  GLUCOTROL XL  Take 1 tablet (2.5 mg total) by mouth daily.     hydrochlorothiazide 12.5 MG capsule  Commonly known as:  MICROZIDE  Take 1 capsule (12.5 mg total) by mouth daily.     lisinopril 5 MG tablet  Commonly known as:  PRINIVIL,ZESTRIL  Take 1 tablet (5 mg  total) by mouth daily.     pantoprazole 40 MG tablet  Commonly known as:  PROTONIX  Take 1 tablet (40 mg total) by mouth daily.     potassium chloride SA 20 MEQ tablet  Commonly known as:  K-DUR,KLOR-CON  Take 1 tablet (20 mEq total) by mouth daily.     traMADol 50 MG tablet  Commonly known as:  ULTRAM  Take 1 tablet (50 mg total) by mouth every 4 (four) hours as needed for moderate pain.     warfarin 5 MG tablet  Commonly known as:  COUMADIN  Take 1 tablet (5 mg total) by mouth one time only at 6 PM.        Tramadol #30 No Refill  Disposition: SNF  Patient's condition: is Good  Follow up: 1. Dr. Scot Dock in 3 weeks   Virgina Jock, PA-C [facility] [phone] 11/19/2015  12:37 PM

## 2015-11-20 DIAGNOSIS — I10 Essential (primary) hypertension: Secondary | ICD-10-CM | POA: Diagnosis not present

## 2015-11-20 DIAGNOSIS — G64 Other disorders of peripheral nervous system: Secondary | ICD-10-CM | POA: Diagnosis not present

## 2015-11-20 DIAGNOSIS — C9000 Multiple myeloma not having achieved remission: Secondary | ICD-10-CM | POA: Diagnosis not present

## 2015-11-20 DIAGNOSIS — I96 Gangrene, not elsewhere classified: Secondary | ICD-10-CM | POA: Diagnosis not present

## 2015-11-21 ENCOUNTER — Telehealth: Payer: Self-pay | Admitting: Medical Oncology

## 2015-11-21 ENCOUNTER — Telehealth: Payer: Self-pay

## 2015-11-21 NOTE — Telephone Encounter (Signed)
I left message for [name] to return my call re her questions.

## 2015-11-21 NOTE — Telephone Encounter (Signed)
Jan NP with [facility] said pt is resident there now and wants to know why pt takes acyclovir daily; also has questions about Plavix and Warfarin; per current and hx med list Dr Curt Bears has previously prescribed. Jan will contact Dr Worthy Flank office at [phone].

## 2015-11-22 ENCOUNTER — Telehealth: Payer: Self-pay

## 2015-11-22 NOTE — Telephone Encounter (Signed)
-----   Message from Angelia Mould, MD sent at 11/22/2015  3:19 PM EDT ----- Regarding: RE: need for both Plavix and Coumadin Does not need Plavix from my standpoint. thanks ----- Message -----    From: Denman George, RN    Sent: 11/22/2015   3:06 PM      To: Angelia Mould, MD Subject: need for both Plavix and Coumadin              Rec'd call from [facility] with question of necessity for both Plavix and Warfarin.  Is on Warfarin for hx DVT.  From the research I did, she may have been started on the Plavix following a Tibial angioplasty in [facility] 08/2015. (see discharge note by Dr. Broadus John 09/14/15)  Please advise if she should continue her Plavix.

## 2015-11-22 NOTE — Telephone Encounter (Signed)
Phone call to Opal Sidles, NP @ [facility].  Was advised that per Dr. Scot Dock, the pt. does not need to continue Plavix from Vascular Surgery Standpoint.  Opal Sidles, NP verb. understanding.  Will fax Triage note to [facility].

## 2015-11-27 ENCOUNTER — Encounter: Payer: Self-pay | Admitting: Vascular Surgery

## 2015-11-27 NOTE — Anesthesia Postprocedure Evaluation (Signed)
Anesthesia Post Note  Patient: [name]  Procedure(s) Performed: Procedure(s) (LRB): DEBRIDEMENT OF LEFT  BELOW KNEE AMPUTATION  (Left) APPLICATION OF WOUND VAC LEFT BELOW THE KNEE AMPUTEE. (Left)  Patient location during evaluation: PACU Anesthesia Type: General Level of consciousness: awake and awake and alert Pain management: pain level controlled Vital Signs Assessment: post-procedure vital signs reviewed and stable Respiratory status: spontaneous breathing, nonlabored ventilation and respiratory function stable Anesthetic complications: no    Last Vitals:  Filed Vitals:   11/19/15 0610 11/19/15 1400  BP: 145/67 150/60  Pulse: 68 71  Temp: 37.2 C 37.1 C  Resp: 18 16    Last Pain:  Filed Vitals:   11/19/15 1411  PainSc: 3                  Nneka Blanda COKER

## 2015-11-29 ENCOUNTER — Telehealth: Payer: Self-pay

## 2015-11-29 DIAGNOSIS — E119 Type 2 diabetes mellitus without complications: Secondary | ICD-10-CM | POA: Diagnosis not present

## 2015-11-29 DIAGNOSIS — C9000 Multiple myeloma not having achieved remission: Secondary | ICD-10-CM | POA: Diagnosis not present

## 2015-11-29 DIAGNOSIS — I96 Gangrene, not elsewhere classified: Secondary | ICD-10-CM | POA: Diagnosis not present

## 2015-11-29 DIAGNOSIS — M25551 Pain in right hip: Secondary | ICD-10-CM | POA: Diagnosis not present

## 2015-11-29 NOTE — Telephone Encounter (Signed)
Spoke with nurse, Teila @ [facility].  Reported that pt's wound measurements are 3.3 x 9.5 x 0.3 cm.  Also stated there is scant amt. Of serous drainage.  Questioned about a change in wound treatment, as not able to keep the wound vac on, due to the minimal drainage.  Also reported there was no injury noted following pt's fall.  Reported the patient has been up in the w/c and doing Physical therapy.  Gave Dr. Oneida Alar an update on pt's wound.  Recommended to d/c wound vac, apply Hydrogel to wound daily and apply gauze dressing.  Nurse, Radonna Ricker notified of new wound care order; verb. Understanding. Notified pt's daughter Hilda Blades of the above change in wound care.  And, also left voice message with another daughter, [name] re: the above.  Advised to return call if questions.

## 2015-11-29 NOTE — Telephone Encounter (Signed)
rec'd phone call from pt's daughter.  Reported the pt. [place] out of bed at the nursing home last night about 3:30 AM.  Reported the pt. "hit her right leg."  Unable to tell if there was any injury to the left BKA stump.  Also, reported that the vac was changed to a smaller machine, and that the nurses were having difficulty getting the vac to stay on the left BKA stump area.  Attempted to call and speak to a nurse at [facility].  Unable to speak to nurse at time of phone call; left message with receptionist to have the nurse call back.  Notified the pt's daughter of attempt to contact nurse at [facility].  Daughter requested an update after talking to the nursing home.

## 2015-11-30 DIAGNOSIS — M25551 Pain in right hip: Secondary | ICD-10-CM | POA: Diagnosis not present

## 2015-11-30 DIAGNOSIS — C9000 Multiple myeloma not having achieved remission: Secondary | ICD-10-CM | POA: Diagnosis not present

## 2015-11-30 DIAGNOSIS — I96 Gangrene, not elsewhere classified: Secondary | ICD-10-CM | POA: Diagnosis not present

## 2015-11-30 DIAGNOSIS — Z89522 Acquired absence of left knee: Secondary | ICD-10-CM | POA: Diagnosis not present

## 2015-11-30 DIAGNOSIS — E119 Type 2 diabetes mellitus without complications: Secondary | ICD-10-CM | POA: Diagnosis not present

## 2015-12-05 ENCOUNTER — Ambulatory Visit (INDEPENDENT_AMBULATORY_CARE_PROVIDER_SITE_OTHER): Payer: Medicare Other | Admitting: Vascular Surgery

## 2015-12-05 ENCOUNTER — Ambulatory Visit: Payer: Medicare Other | Admitting: Vascular Surgery

## 2015-12-05 ENCOUNTER — Encounter: Payer: Self-pay | Admitting: Vascular Surgery

## 2015-12-05 VITALS — BP 131/83 | HR 82 | Temp 98.5°F | Ht 62.0 in | Wt 102.0 lb

## 2015-12-05 DIAGNOSIS — Z48812 Encounter for surgical aftercare following surgery on the circulatory system: Secondary | ICD-10-CM

## 2015-12-05 NOTE — Progress Notes (Signed)
Patient name: [name] MRN: [id number] DOB: 12/29/1935 Sex: female  REASON FOR VISIT: Follow up  HPI: [name] is a 80 y.o. female who presented with a nonhealing left below the knee amputation. She had developed an eschar over the lateral and central portions of the incision. On 11/16/2015, she underwent excisional debridement of her left below the knee amputation and placement of a negative pressure dressing. Apparently the negative pressure dressing was not functioning properly ultimately this was discontinued. The skilled nursing facility has simply been doing daily dressing changes for her.  Current Outpatient Prescriptions  Medication Sig Dispense Refill  . acyclovir (ZOVIRAX) 400 MG tablet Take 1 tablet (400 mg total) by mouth 2 (two) times daily. 30 tablet 0  . ALPRAZolam (XANAX) 0.5 MG tablet Take 1 tablet (0.5 mg total) by mouth 2 (two) times daily as needed. for anxiety 60 tablet 0  . amLODipine (NORVASC) 5 MG tablet Take 1 tablet (5 mg total) by mouth daily. 30 tablet 1  . Calcium Carbonate-Vitamin D 600-400 MG-UNIT tablet Take 1 tablet by mouth daily. 30 tablet 1  . clopidogrel (PLAVIX) 75 MG tablet Take 1 tablet (75 mg total) by mouth daily. 30 tablet 0  . glipiZIDE (GLUCOTROL XL) 2.5 MG 24 hr tablet Take 1 tablet (2.5 mg total) by mouth daily. 30 tablet 1  . hydrochlorothiazide (MICROZIDE) 12.5 MG capsule Take 1 capsule (12.5 mg total) by mouth daily. 30 capsule 1  . lisinopril (PRINIVIL,ZESTRIL) 5 MG tablet Take 1 tablet (5 mg total) by mouth daily. 30 tablet 1  . pantoprazole (PROTONIX) 40 MG tablet Take 1 tablet (40 mg total) by mouth daily. 30 tablet 1  . potassium chloride SA (K-DUR,KLOR-CON) 20 MEQ tablet Take 1 tablet (20 mEq total) by mouth daily. 30 tablet 0  . traMADol (ULTRAM) 50 MG tablet Take 1 tablet (50 mg total) by mouth every 4 (four) hours as needed for moderate pain. 30 tablet 0  . warfarin (COUMADIN) 5 MG tablet Take 1 tablet (5 mg total) by mouth  one time only at 6 PM. 30 tablet 0   No current facility-administered medications for this visit.   Facility-Administered Medications Ordered in Other Visits  Medication Dose Route Frequency Provider Last Rate Last Dose  . 0.9 %  sodium chloride infusion   Intravenous Once Curt Bears, MD   Stopped at 03/21/15 1720  . 0.9 %  sodium chloride infusion   Intravenous Once Curt Bears, MD      . 0.9 %  sodium chloride infusion   Intravenous Once Curt Bears, MD   Stopped at 07/31/15 1638    REVIEW OF SYSTEMS:  '[X]'$  denotes positive finding, '[ ]'$  denotes negative finding Cardiac  Comments:  Chest pain or chest pressure:    Shortness of breath upon exertion:    Short of breath when lying flat:    Irregular heart rhythm:    Constitutional    Fever or chills:      PHYSICAL EXAM: Filed Vitals:   12/05/15 1350  BP: 131/83  Pulse: 82  Temp: 98.5 F (36.9 C)  TempSrc: Oral  Height: '5\' 2"'$  (1.575 m)  Weight: 102 lb (46.267 kg)  SpO2: 95%    GENERAL: The patient is a well-nourished female, in no acute distress. The vital signs are documented above. CARDIOVASCULAR: There is a regular rate and rhythm. PULMONARY: There is good air exchange bilaterally without wheezing or rales. The wound on her left below the knee amputation site  is granulating nicely with excellent perfusion. It measures 9.5 cm in length by 1.5 cm in maximum width.  MEDICAL ISSUES:  STATUS POST EXCISIONAL DEBRIDEMENT OF LEFT BELOW-KNEE AMPUTATION AND PLACEMENT OF NEGATIVE PRESSURE DRESSING: the wound is healing nicely and we'll continue with dressing changes daily with hydrogel and a moist 4 x 4. Plan on seeing her back in 4 weeks unless she call sooner. Once the wound is healed she can get in a stump shrinker and continue to work on obtaining a prosthesis for the left below-the-knee amputation.  [name] [facility] [place]: [phone]

## 2015-12-06 DIAGNOSIS — C9000 Multiple myeloma not having achieved remission: Secondary | ICD-10-CM | POA: Diagnosis not present

## 2015-12-06 DIAGNOSIS — I96 Gangrene, not elsewhere classified: Secondary | ICD-10-CM | POA: Diagnosis not present

## 2015-12-06 DIAGNOSIS — E119 Type 2 diabetes mellitus without complications: Secondary | ICD-10-CM | POA: Diagnosis not present

## 2015-12-06 DIAGNOSIS — G64 Other disorders of peripheral nervous system: Secondary | ICD-10-CM | POA: Diagnosis not present

## 2015-12-17 ENCOUNTER — Telehealth: Payer: Self-pay | Admitting: Family Medicine

## 2015-12-17 DIAGNOSIS — Z89519 Acquired absence of unspecified leg below knee: Secondary | ICD-10-CM

## 2015-12-17 NOTE — Telephone Encounter (Signed)
Daughter called in requesting cb about ordering a bed and her discharge from nursing home. Please call daughter back at [phone] Thanks

## 2015-12-17 NOTE — Telephone Encounter (Signed)
Daughter advised.  Patient already has FU here on 12/24/15.

## 2015-12-17 NOTE — Telephone Encounter (Signed)
See what details you can get.  I'm okay with going ahead with this- reliable patient and family, with patient having amputation recently.  Thanks.

## 2015-12-17 NOTE — Telephone Encounter (Signed)
I put in the orders.  If the window of time for the visit with me prior to the order as expired, then proceed with the order and she'll need f/u here in the next few weeks.  Thanks.

## 2015-12-17 NOTE — Telephone Encounter (Signed)
Daughter [name]) called to discuss pt , ot, home health nursing to come to the home  [facility] is [facility] cb number is [phone] Also pt needs rx for bed,

## 2015-12-17 NOTE — Telephone Encounter (Signed)
[name] (daughter) states that apparently the facility MD wrote orders to [facility] over the holiday weekend and no one was there to take the order.  Now, the patient was discharged to home yesterday and [facility] says they need an order from PCP.  [name] says they need an order for: 1) a hospital bed.  She has already rented it because she needed it yesterday but [facility] says to ask you to order it and leave the narrative blank. 2) OT 3) HH Nursing 4) 3 in 1 toilet 5) transition from wheelchair to bathing bench

## 2015-12-17 NOTE — Telephone Encounter (Signed)
Left message on patient's voicemail to return call

## 2015-12-18 ENCOUNTER — Encounter: Payer: Self-pay | Admitting: Internal Medicine

## 2015-12-18 NOTE — Progress Notes (Signed)
Form faxed 07/16/15 I sent to medical records

## 2015-12-20 ENCOUNTER — Other Ambulatory Visit: Payer: Self-pay | Admitting: Family Medicine

## 2015-12-20 DIAGNOSIS — Z4789 Encounter for other orthopedic aftercare: Secondary | ICD-10-CM | POA: Diagnosis not present

## 2015-12-20 DIAGNOSIS — I96 Gangrene, not elsewhere classified: Secondary | ICD-10-CM

## 2015-12-20 DIAGNOSIS — R627 Adult failure to thrive: Secondary | ICD-10-CM | POA: Diagnosis not present

## 2015-12-20 DIAGNOSIS — Z89511 Acquired absence of right leg below knee: Secondary | ICD-10-CM | POA: Diagnosis not present

## 2015-12-20 DIAGNOSIS — Z89512 Acquired absence of left leg below knee: Secondary | ICD-10-CM | POA: Diagnosis not present

## 2015-12-20 DIAGNOSIS — R269 Unspecified abnormalities of gait and mobility: Secondary | ICD-10-CM | POA: Diagnosis not present

## 2015-12-24 ENCOUNTER — Encounter: Payer: Self-pay | Admitting: Family Medicine

## 2015-12-24 ENCOUNTER — Ambulatory Visit (INDEPENDENT_AMBULATORY_CARE_PROVIDER_SITE_OTHER): Payer: Medicare Other | Admitting: Family Medicine

## 2015-12-24 VITALS — BP 122/66 | HR 87 | Temp 98.3°F

## 2015-12-24 DIAGNOSIS — Z89512 Acquired absence of left leg below knee: Secondary | ICD-10-CM | POA: Diagnosis not present

## 2015-12-24 DIAGNOSIS — E119 Type 2 diabetes mellitus without complications: Secondary | ICD-10-CM | POA: Diagnosis not present

## 2015-12-24 MED ORDER — TRAMADOL HCL 50 MG PO TABS: 50.0000 mg | ORAL_TABLET | Freq: Four times a day (QID) | ORAL | Status: DC | PRN

## 2015-12-24 MED ORDER — PANTOPRAZOLE SODIUM 40 MG PO TBEC: 40.0000 mg | DELAYED_RELEASE_TABLET | Freq: Every day | ORAL | Status: DC

## 2015-12-24 NOTE — Progress Notes (Signed)
Pre visit review using our clinic review tool, if applicable. No additional management support is needed unless otherwise documented below in the visit note.  Inpatient and SNF f/u after L BKA.  She has been working on sugar control with diet and baseline meds at home.  No ADE on meds. Sugars have been controlled at home.  She has her daughter helping at home.    She can't reposition in a normal bed and will need an adjustable bed with short side rails for safety and an overhead bar and suspended handle to reposition to prev skin breakdown.  She has had trouble with wound healing on the L stump.  She needs HH RN help along with OT and PT given the amputation and trouble healing.  She still needs daily wound dressing and bandage changes.   She can't walk due to amputation.  She needs thicker toilet set to help getting off and on toilet.  She needs a grabber/reacher to get to objects that she can't reach due to the amputation.  She has a wheelchair at home.    She has not purulent drainage from the wound.  Her pain is controlled.  No fevers.    Meds, vitals, and allergies reviewed.   ROS: See HPI.  Otherwise, noncontributory.  nad ncat Mmm Neck supple, no LA rrr ctab abd soft, not ttp Ext with R leg w/o edema L stump checked with linear wound healing without discharge.  No spreading erythema.  Wound is clean appearing.  Redressed.

## 2015-12-24 NOTE — Patient Instructions (Signed)
I'll work on the orders.  Don't change your meds for now.  Take care.  Glad to see you.

## 2015-12-25 ENCOUNTER — Telehealth: Payer: Self-pay | Admitting: *Deleted

## 2015-12-25 DIAGNOSIS — Z89512 Acquired absence of left leg below knee: Secondary | ICD-10-CM

## 2015-12-25 NOTE — Assessment & Plan Note (Signed)
Not due for recheck A1c.  Sugars controlled at home.

## 2015-12-25 NOTE — Assessment & Plan Note (Signed)
Wound slowly healing.  She can't reposition in a normal bed and will need an adjustable bed with short side rails for safety and an overhead bar and suspended handle to reposition to prev skin breakdown. She has had trouble with wound healing on the L stump. She needs HH RN help along with OT and PT given the amputation and trouble healing. She still needs daily wound dressing and bandage changes.  She can't walk due to amputation. She needs thicker toilet set to help getting off and on toilet. She needs a grabber/reacher to get to objects that she can't reach due to the amputation. She has a wheelchair at home.  Continue wound care.  I'll work on DME orders and HH.  >25 minutes spent in face to face time with patient, >50% spent in counselling or coordination of care

## 2015-12-26 ENCOUNTER — Other Ambulatory Visit: Payer: Self-pay

## 2015-12-26 MED ORDER — GLIPIZIDE ER 2.5 MG PO TB24: 2.5000 mg | ORAL_TABLET | Freq: Every day | ORAL | Status: DC

## 2015-12-26 MED ORDER — AMLODIPINE BESYLATE 5 MG PO TABS: 5.0000 mg | ORAL_TABLET | Freq: Every day | ORAL | Status: DC

## 2015-12-26 MED ORDER — WARFARIN SODIUM 5 MG PO TABS: 5.0000 mg | ORAL_TABLET | Freq: Every day | ORAL | Status: DC

## 2015-12-26 MED ORDER — POTASSIUM CHLORIDE CRYS ER 20 MEQ PO TBCR: 20.0000 meq | EXTENDED_RELEASE_TABLET | Freq: Every day | ORAL | Status: DC

## 2015-12-26 NOTE — Telephone Encounter (Signed)
Sent Darlina Guys a staff message to make sure that she has all of the orders and everything that she needs. Will wait to hear back from her.

## 2015-12-26 NOTE — Telephone Encounter (Signed)
[name] pts daughter (DPR signed) left v/m; pt was seen 12/24/15; pt was recently discharged from [facility] and pt requesting refills on amlodipine, glipizide,K and warfarin. Lauraine Rinne PA previously prescribed. [facility] said working on orders and do not change meds for now.Please advise.[employer] [place].

## 2015-12-26 NOTE — Telephone Encounter (Signed)
Order signed. Thanks.

## 2015-12-26 NOTE — Telephone Encounter (Signed)
Sent.  Continue meds as is.  The order issue was in reference to [facility] and hardware.  Thanks.

## 2015-12-27 ENCOUNTER — Encounter: Payer: Self-pay | Admitting: Physical Medicine & Rehabilitation

## 2015-12-27 ENCOUNTER — Encounter: Payer: Medicare Other | Attending: Physical Medicine & Rehabilitation | Admitting: Physical Medicine & Rehabilitation

## 2015-12-27 VITALS — BP 130/70 | HR 88 | Resp 14

## 2015-12-27 DIAGNOSIS — W19XXXA Unspecified fall, initial encounter: Secondary | ICD-10-CM | POA: Diagnosis not present

## 2015-12-27 DIAGNOSIS — R011 Cardiac murmur, unspecified: Secondary | ICD-10-CM | POA: Insufficient documentation

## 2015-12-27 DIAGNOSIS — F329 Major depressive disorder, single episode, unspecified: Secondary | ICD-10-CM | POA: Insufficient documentation

## 2015-12-27 DIAGNOSIS — T8781 Dehiscence of amputation stump: Secondary | ICD-10-CM | POA: Insufficient documentation

## 2015-12-27 DIAGNOSIS — I10 Essential (primary) hypertension: Secondary | ICD-10-CM | POA: Diagnosis not present

## 2015-12-27 DIAGNOSIS — Z86718 Personal history of other venous thrombosis and embolism: Secondary | ICD-10-CM | POA: Insufficient documentation

## 2015-12-27 DIAGNOSIS — E119 Type 2 diabetes mellitus without complications: Secondary | ICD-10-CM | POA: Diagnosis not present

## 2015-12-27 DIAGNOSIS — X58XXXA Exposure to other specified factors, initial encounter: Secondary | ICD-10-CM | POA: Insufficient documentation

## 2015-12-27 DIAGNOSIS — M1990 Unspecified osteoarthritis, unspecified site: Secondary | ICD-10-CM | POA: Diagnosis not present

## 2015-12-27 DIAGNOSIS — R269 Unspecified abnormalities of gait and mobility: Secondary | ICD-10-CM | POA: Diagnosis not present

## 2015-12-27 DIAGNOSIS — F419 Anxiety disorder, unspecified: Secondary | ICD-10-CM | POA: Diagnosis not present

## 2015-12-27 DIAGNOSIS — R509 Fever, unspecified: Secondary | ICD-10-CM | POA: Insufficient documentation

## 2015-12-27 DIAGNOSIS — Z7901 Long term (current) use of anticoagulants: Secondary | ICD-10-CM | POA: Diagnosis not present

## 2015-12-27 DIAGNOSIS — Z87891 Personal history of nicotine dependence: Secondary | ICD-10-CM | POA: Insufficient documentation

## 2015-12-27 DIAGNOSIS — I429 Cardiomyopathy, unspecified: Secondary | ICD-10-CM | POA: Diagnosis not present

## 2015-12-27 DIAGNOSIS — C9000 Multiple myeloma not having achieved remission: Secondary | ICD-10-CM | POA: Insufficient documentation

## 2015-12-27 DIAGNOSIS — E785 Hyperlipidemia, unspecified: Secondary | ICD-10-CM | POA: Insufficient documentation

## 2015-12-27 DIAGNOSIS — Z89512 Acquired absence of left leg below knee: Secondary | ICD-10-CM | POA: Insufficient documentation

## 2015-12-27 NOTE — Progress Notes (Signed)
Subjective:    Patient ID: [name], female    DOB: 1936/01/31, 80 y.o.   MRN: [id number]  HPI 80 y.o. female comes presents for follow up after being discharged after left BKA. Pt poor historian.  She was last seen in clinic 11/01/15.  She was staying at a SNF, but since has returned home.  Currently she is doing home exercises.  She notes she had a fall this AM, falling on her right knee, but denying trauma to her stump.  She saw vascular surgeon, who is managing the wound, it was debrided recently.  Currently, it is being changed daily.  Pt still does not have a stump shrinker due to open wound along the anterior stump.  On last visit, she was noted to be febrile, which has since resolved.  She is not aware if her labs have been drawn recently.   Pain Inventory Average Pain NA Pain Right Now 5 My pain is aching  In the last 24 hours, has pain interfered with the following? General activity 0 Relation with others 0 Enjoyment of life 0 What TIME of day is your pain at its worst? NA Sleep (in general) Fair  Pain is worse with: some activites Pain improves with: therapy/exercise Relief from Meds: 8  Mobility walk with assistance use a walker ability to climb steps?  no do you drive?  no use a wheelchair needs help with transfers transfers alone Do you have any goals in this area?  yes  Function retired I need assistance with the following:  bathing, meal prep, household duties and shopping  Neuro/Psych trouble walking confusion  Prior Studies Any changes since last visit?  no  Physicians involved in your care Any changes since last visit?  no   Family History  Problem Relation Age of Onset  . Diabetes Mother   . Hypertension Mother   . Arthritis Mother   . Stroke Mother   . Diabetes Father   . Hypertension Father   . Arthritis Father   . Stroke Father   . Cancer Sister     stomach  . Stroke Brother   . Cancer Brother     prostate <50  . Stroke  Daughter   . Stroke Son   . Arthritis Other     Parents  . Cancer Other     Colon, 1st degree relative <60  . Diabetes Other     Parents  . Stroke Other     1st degree relative <60   Social History   Social History  . Marital Status: Widowed    Spouse Name: N/A  . Number of Children: N/A  . Years of Education: N/A   Occupational History  . Retired    Social History Main Topics  . Smoking status: Former Smoker -- 1.00 packs/day for 13 years    Quit date: 09/01/1973  . Smokeless tobacco: Never Used     Comment: QIUT MANY YEARS AGO  . Alcohol Use: No  . Drug Use: No  . Sexual Activity: No   Other Topics Concern  . None   Social History Narrative   Regular exercise:  Yes   Past Surgical History  Procedure Laterality Date  . Abdominal hysterectomy    . Ankle fracture surgery Left   . Ear cyst excision Left 04/16/2015    Procedure: EXCISION FACIAL CYST LEFT SIDE ;  Surgeon: Izora Gala, MD;  Location: [facility];  Service: ENT;  Laterality: Left;  .  Peripheral vascular catheterization  08/14/2015    Procedure: Lower Extremity Intervention;  Surgeon: Renford Dills, MD;  Location: [facility] INVASIVE CV [facility];  Service: Cardiovascular;;  . Peripheral vascular catheterization N/A 08/14/2015    Procedure: Abdominal Aortogram w/Lower Extremity;  Surgeon: Renford Dills, MD;  Location: [facility] INVASIVE CV LAB;  Service: Cardiovascular;  Laterality: N/A;  . Colonoscopy    . Eye surgery Bilateral     cataract surgery  . Amputation Left 10/09/2015    Procedure: Left Leg AMPUTATION BELOW KNEE;  Surgeon: Chuck Hint, MD;  Location: [facility] OR;  Service: Vascular;  Laterality: Left;  . I&d extremity Left 11/16/2015    Procedure: DEBRIDEMENT OF LEFT  BELOW KNEE AMPUTATION ;  Surgeon: Chuck Hint, MD;  Location: [facility] OR;  Service: Vascular;  Laterality: Left;  . Application of wound vac Left 11/16/2015    Procedure: APPLICATION OF WOUND VAC LEFT BELOW THE KNEE  AMPUTEE.;  Surgeon: Chuck Hint, MD;  Location: [facility] OR;  Service: Vascular;  Laterality: Left;   Past Medical History  Diagnosis Date  . Blood transfusion   . Depression     Mild  . History of chicken pox   . Hyperlipidemia   . Degenerative joint disease   . Phlebitis   . Multiple myeloma     per Dr. [name], s/p palliative radiation for leg pain 2011 per Dr. Roselind Messier  . Deep vein thrombosis of bilateral lower extremities (HCC)   . Cardiomyopathy (HCC)   . UTI (urinary tract infection) 09/08/2013  . History of radiation therapy 08/30/13-09/20/13    20 gray to lower lumbar/upper sacrum  . Heart murmur   . Anemia   . Family history of anesthesia complication     DAUGHTER CPR AFTER  . History of shingles   . GERD (gastroesophageal reflux disease)     takes Protonix daily  . Diabetes mellitus     takes Glipizide daily  . Hypertension     takes Amlodipine,HCTZ,and Lisinopril daily  . Anxiety     takes Xanax daily as needed   BP 130/70 mmHg  Pulse 88  Resp 14  Opioid Risk Score:   Fall Risk Score:  `1  Depression screen PHQ 2/9  Depression screen PHQ 2/9 11/01/2015  Decreased Interest 0  Down, Depressed, Hopeless 0  PHQ - 2 Score 0   Review of Systems  Respiratory: Positive for cough.   Endocrine:       High blood sugar   Musculoskeletal: Positive for gait problem.  Psychiatric/Behavioral: Positive for confusion.  All other systems reviewed and are negative.     Objective:   Physical Exam Gen: Frail appearing. No acute distress. Vital signs reviewed. HENT:  Normocephalic, atraumatic.   Eyes: EOMI and and conjunctiva within normal limits. Cardiovascular: Normal rate and regular rhythm.  No murmur or rubs Respiratory: Effort normal and breath sounds normal. No respiratory distress.   GI: Soft. Bowel sounds are normal. She exhibits no distension. No pain MSK: Left BKA.  Minimal tenderness.  Minimal edema.  Neurological:  Patient is alert and oriented x3  with increased time and min cues. B/l UE 5/5 proximal distal.   RLE: Hip flexion , knee extension 5/5, ankle dorsi/plantar flexion 5/5.   LLE: Hip flexion 5/5 Dysarthric speech Skin: Open wound to distal anterior wound. Stump is not dressed. Psych: pleasant, slow, flat, quiet    Assessment & Plan:  80 y.o. female comes to Transitional care management clinic after being discharged from  acute inpatient rehabilitation unit for comprehensive rehabilitation services. Pt was admitted on 10/11/15 due to left BKA.  1. Left BKA  Cont HEP  Open wound to anterior distal stump  Cont dressing changes per Vascular  Cont to follow up with Surgery  Will consider referral for PT in future, once wound healed better             Avoid pressure to open wound areas.  2. Chronic coumadin  Cont to follow INR  3. Abnormality of gait  Cont wheelchair for safety  4. Falls  Educated on safety, no significant trauma noted on right knee

## 2015-12-28 ENCOUNTER — Encounter: Payer: Self-pay | Admitting: Vascular Surgery

## 2015-12-29 DIAGNOSIS — Z7901 Long term (current) use of anticoagulants: Secondary | ICD-10-CM | POA: Diagnosis not present

## 2015-12-29 DIAGNOSIS — M1991 Primary osteoarthritis, unspecified site: Secondary | ICD-10-CM | POA: Diagnosis not present

## 2015-12-29 DIAGNOSIS — I10 Essential (primary) hypertension: Secondary | ICD-10-CM | POA: Diagnosis not present

## 2015-12-29 DIAGNOSIS — Z7984 Long term (current) use of oral hypoglycemic drugs: Secondary | ICD-10-CM | POA: Diagnosis not present

## 2015-12-29 DIAGNOSIS — Z89512 Acquired absence of left leg below knee: Secondary | ICD-10-CM | POA: Diagnosis not present

## 2015-12-29 DIAGNOSIS — C9000 Multiple myeloma not having achieved remission: Secondary | ICD-10-CM | POA: Diagnosis not present

## 2015-12-29 DIAGNOSIS — E1151 Type 2 diabetes mellitus with diabetic peripheral angiopathy without gangrene: Secondary | ICD-10-CM | POA: Diagnosis not present

## 2015-12-29 DIAGNOSIS — T8789 Other complications of amputation stump: Secondary | ICD-10-CM | POA: Diagnosis not present

## 2015-12-29 DIAGNOSIS — Z5181 Encounter for therapeutic drug level monitoring: Secondary | ICD-10-CM | POA: Diagnosis not present

## 2015-12-29 DIAGNOSIS — Z79891 Long term (current) use of opiate analgesic: Secondary | ICD-10-CM | POA: Diagnosis not present

## 2015-12-29 DIAGNOSIS — Z86718 Personal history of other venous thrombosis and embolism: Secondary | ICD-10-CM | POA: Diagnosis not present

## 2016-01-01 DIAGNOSIS — E1151 Type 2 diabetes mellitus with diabetic peripheral angiopathy without gangrene: Secondary | ICD-10-CM | POA: Diagnosis not present

## 2016-01-01 DIAGNOSIS — Z7984 Long term (current) use of oral hypoglycemic drugs: Secondary | ICD-10-CM | POA: Diagnosis not present

## 2016-01-01 DIAGNOSIS — C9000 Multiple myeloma not having achieved remission: Secondary | ICD-10-CM | POA: Diagnosis not present

## 2016-01-01 DIAGNOSIS — Z7901 Long term (current) use of anticoagulants: Secondary | ICD-10-CM | POA: Diagnosis not present

## 2016-01-01 DIAGNOSIS — M1991 Primary osteoarthritis, unspecified site: Secondary | ICD-10-CM | POA: Diagnosis not present

## 2016-01-01 DIAGNOSIS — T8789 Other complications of amputation stump: Secondary | ICD-10-CM | POA: Diagnosis not present

## 2016-01-01 DIAGNOSIS — Z89512 Acquired absence of left leg below knee: Secondary | ICD-10-CM | POA: Diagnosis not present

## 2016-01-01 DIAGNOSIS — Z79891 Long term (current) use of opiate analgesic: Secondary | ICD-10-CM | POA: Diagnosis not present

## 2016-01-01 DIAGNOSIS — I10 Essential (primary) hypertension: Secondary | ICD-10-CM | POA: Diagnosis not present

## 2016-01-01 DIAGNOSIS — Z86718 Personal history of other venous thrombosis and embolism: Secondary | ICD-10-CM | POA: Diagnosis not present

## 2016-01-01 DIAGNOSIS — Z5181 Encounter for therapeutic drug level monitoring: Secondary | ICD-10-CM | POA: Diagnosis not present

## 2016-01-02 ENCOUNTER — Encounter: Payer: Self-pay | Admitting: Vascular Surgery

## 2016-01-02 ENCOUNTER — Ambulatory Visit (INDEPENDENT_AMBULATORY_CARE_PROVIDER_SITE_OTHER): Payer: Medicare Other | Admitting: Vascular Surgery

## 2016-01-02 VITALS — BP 96/67 | HR 83 | Temp 98.3°F | Resp 14 | Ht 62.0 in | Wt 105.0 lb

## 2016-01-02 DIAGNOSIS — Z48812 Encounter for surgical aftercare following surgery on the circulatory system: Secondary | ICD-10-CM

## 2016-01-02 NOTE — Progress Notes (Signed)
Patient name: [name] MRN: [id number] DOB: Aug 05, 1936 Sex: female  REASON FOR VISIT: follow up after revision of left below-the-knee amputation.  HPI: [name] is a 80 y.o. female who had a wound on her left below the knee amputation. On 11/16/2015, I took her to the operating room for excisional debridement and placement of a negative pressure dressing. When I saw her last on 12/05/2015, the wound was granulating nicely with excellent perfusion and measured 9.5 cm in length by 1.5 cm in width. The negative pressure dressing had been discontinued by the home health nurse. Plan was to continue the dressing changes until this had healed and then try to get her fitted for a stump shrinker and start working towards a prosthesis.  Today she has no specific complaints. She denies fever or chills.  Current Outpatient Prescriptions  Medication Sig Dispense Refill  . acyclovir (ZOVIRAX) 400 MG tablet Take 1 tablet (400 mg total) by mouth 2 (two) times daily. 30 tablet 0  . ALPRAZolam (XANAX) 0.5 MG tablet Take 1 tablet (0.5 mg total) by mouth 2 (two) times daily as needed. for anxiety 60 tablet 0  . amLODipine (NORVASC) 5 MG tablet Take 1 tablet (5 mg total) by mouth daily. 30 tablet 5  . Calcium Carbonate-Vitamin D 600-400 MG-UNIT tablet Take 1 tablet by mouth daily. 30 tablet 1  . clopidogrel (PLAVIX) 75 MG tablet Take 1 tablet (75 mg total) by mouth daily. 30 tablet 0  . glipiZIDE (GLUCOTROL XL) 2.5 MG 24 hr tablet Take 1 tablet (2.5 mg total) by mouth daily. 30 tablet 5  . hydrochlorothiazide (MICROZIDE) 12.5 MG capsule Take 1 capsule (12.5 mg total) by mouth daily. 30 capsule 1  . lisinopril (PRINIVIL,ZESTRIL) 5 MG tablet Take 1 tablet (5 mg total) by mouth daily. 30 tablet 1  . pantoprazole (PROTONIX) 40 MG tablet Take 1 tablet (40 mg total) by mouth daily. 30 tablet 5  . potassium chloride SA (K-DUR,KLOR-CON) 20 MEQ tablet Take 1 tablet (20 mEq total) by mouth daily. 30 tablet 5  .  traMADol (ULTRAM) 50 MG tablet Take 1 tablet (50 mg total) by mouth every 6 (six) hours as needed for moderate pain. 100 tablet 2  . warfarin (COUMADIN) 5 MG tablet Take 1 tablet (5 mg total) by mouth daily. 30 tablet 5   No current facility-administered medications for this visit.   Facility-Administered Medications Ordered in Other Visits  Medication Dose Route Frequency Provider Last Rate Last Dose  . 0.9 %  sodium chloride infusion   Intravenous Once Curt Bears, MD   Stopped at 03/21/15 1720  . 0.9 %  sodium chloride infusion   Intravenous Once Curt Bears, MD      . 0.9 %  sodium chloride infusion   Intravenous Once Curt Bears, MD   Stopped at 07/31/15 1638    REVIEW OF SYSTEMS:  '[X]'$  denotes positive finding, '[ ]'$  denotes negative finding Cardiac  Comments:  Chest pain or chest pressure:    Shortness of breath upon exertion:    Short of breath when lying flat:    Irregular heart rhythm:    Constitutional    Fever or chills:      PHYSICAL EXAM: Filed Vitals:   01/02/16 1402  BP: 96/67  Pulse: 83  Temp: 98.3 F (36.8 C)  Resp: 14  Height: '5\' 2"'$  (1.575 m)  Weight: 105 lb (47.628 kg)  SpO2: 98%    GENERAL: The patient is a well-nourished female,  in no acute distress. The vital signs are documented above. CARDIOVASCULAR: There is a regular rate and rhythm. PULMONARY: There is good air exchange bilaterally without wheezing or rales. The wound on her left below the knee amputation measures 5 cm and length and 1 cm in width. This is decreased in size from 9.5 x 1.5 cm. She has excellent granulation tissue.  MEDICAL ISSUES:  Status post left below the knee amputation: the wound is improving rapidly and I think this will heal within the next several weeks. Once it is healed we can get her fitted for a stump shrinker and I have given her son a prescription for this to take to [facility]. Hopefully she will be a candidate for a prosthesis once her BKA has healed. Plan on  seeing her back in 6 weeks. In order to call sooner if she has problems.  [name] [facility] [place]: [phone]

## 2016-01-03 ENCOUNTER — Telehealth: Payer: Self-pay | Admitting: Family Medicine

## 2016-01-03 DIAGNOSIS — Z89512 Acquired absence of left leg below knee: Secondary | ICD-10-CM | POA: Diagnosis not present

## 2016-01-03 NOTE — Telephone Encounter (Signed)
Error

## 2016-01-04 ENCOUNTER — Telehealth: Payer: Self-pay | Admitting: *Deleted

## 2016-01-04 ENCOUNTER — Other Ambulatory Visit: Payer: Self-pay

## 2016-01-04 DIAGNOSIS — Z7901 Long term (current) use of anticoagulants: Secondary | ICD-10-CM | POA: Diagnosis not present

## 2016-01-04 DIAGNOSIS — T8789 Other complications of amputation stump: Secondary | ICD-10-CM | POA: Diagnosis not present

## 2016-01-04 DIAGNOSIS — Z79891 Long term (current) use of opiate analgesic: Secondary | ICD-10-CM | POA: Diagnosis not present

## 2016-01-04 DIAGNOSIS — M1991 Primary osteoarthritis, unspecified site: Secondary | ICD-10-CM | POA: Diagnosis not present

## 2016-01-04 DIAGNOSIS — Z86718 Personal history of other venous thrombosis and embolism: Secondary | ICD-10-CM | POA: Diagnosis not present

## 2016-01-04 DIAGNOSIS — E1151 Type 2 diabetes mellitus with diabetic peripheral angiopathy without gangrene: Secondary | ICD-10-CM | POA: Diagnosis not present

## 2016-01-04 DIAGNOSIS — I10 Essential (primary) hypertension: Secondary | ICD-10-CM | POA: Diagnosis not present

## 2016-01-04 DIAGNOSIS — Z7984 Long term (current) use of oral hypoglycemic drugs: Secondary | ICD-10-CM | POA: Diagnosis not present

## 2016-01-04 DIAGNOSIS — Z5181 Encounter for therapeutic drug level monitoring: Secondary | ICD-10-CM | POA: Diagnosis not present

## 2016-01-04 DIAGNOSIS — Z89512 Acquired absence of left leg below knee: Secondary | ICD-10-CM | POA: Diagnosis not present

## 2016-01-04 DIAGNOSIS — C9000 Multiple myeloma not having achieved remission: Secondary | ICD-10-CM | POA: Diagnosis not present

## 2016-01-04 NOTE — Telephone Encounter (Signed)
Form received from [facility] for bandaging supplies.  Form placed in Dr. Josefine Class In [place].

## 2016-01-04 NOTE — Telephone Encounter (Signed)
[name] (DPR signed) left v/m pt received a tramadol rx # 100 x 2 on 12/24/15.pt last seen 12/24/15. [name] thinks she has thrown prescription in the trash and request another printed rx for tramadol. [name] request cb.

## 2016-01-05 NOTE — Telephone Encounter (Signed)
I'll work on the hard copy.  Thanks.  

## 2016-01-06 MED ORDER — TRAMADOL HCL 50 MG PO TABS: 50.0000 mg | ORAL_TABLET | Freq: Four times a day (QID) | ORAL | Status: DC | PRN

## 2016-01-06 NOTE — Telephone Encounter (Signed)
Printed- okay to call in if needed instead of the pick up.  Thanks .

## 2016-01-07 MED ORDER — ALPRAZOLAM 0.5 MG PO TABS: 0.5000 mg | ORAL_TABLET | Freq: Two times a day (BID) | ORAL | Status: DC | PRN

## 2016-01-07 NOTE — Telephone Encounter (Signed)
Rx's faxed to [facility].

## 2016-01-07 NOTE — Telephone Encounter (Signed)
Printed.  Thanks.  

## 2016-01-07 NOTE — Telephone Encounter (Signed)
Daughter advised and will fax to [facility] in [place].  Daughter also requested another Rx.  Alprazolam  Last Filled:    60 tablet 0 10/19/2015  Please advise.

## 2016-01-08 DIAGNOSIS — Z7901 Long term (current) use of anticoagulants: Secondary | ICD-10-CM | POA: Diagnosis not present

## 2016-01-08 DIAGNOSIS — T8789 Other complications of amputation stump: Secondary | ICD-10-CM | POA: Diagnosis not present

## 2016-01-08 DIAGNOSIS — Z89512 Acquired absence of left leg below knee: Secondary | ICD-10-CM | POA: Diagnosis not present

## 2016-01-08 DIAGNOSIS — Z79891 Long term (current) use of opiate analgesic: Secondary | ICD-10-CM | POA: Diagnosis not present

## 2016-01-08 DIAGNOSIS — M1991 Primary osteoarthritis, unspecified site: Secondary | ICD-10-CM | POA: Diagnosis not present

## 2016-01-08 DIAGNOSIS — E1151 Type 2 diabetes mellitus with diabetic peripheral angiopathy without gangrene: Secondary | ICD-10-CM | POA: Diagnosis not present

## 2016-01-08 DIAGNOSIS — Z86718 Personal history of other venous thrombosis and embolism: Secondary | ICD-10-CM | POA: Diagnosis not present

## 2016-01-08 DIAGNOSIS — I10 Essential (primary) hypertension: Secondary | ICD-10-CM | POA: Diagnosis not present

## 2016-01-08 DIAGNOSIS — Z7984 Long term (current) use of oral hypoglycemic drugs: Secondary | ICD-10-CM | POA: Diagnosis not present

## 2016-01-08 DIAGNOSIS — Z5181 Encounter for therapeutic drug level monitoring: Secondary | ICD-10-CM | POA: Diagnosis not present

## 2016-01-08 DIAGNOSIS — C9000 Multiple myeloma not having achieved remission: Secondary | ICD-10-CM | POA: Diagnosis not present

## 2016-01-10 DIAGNOSIS — Z7901 Long term (current) use of anticoagulants: Secondary | ICD-10-CM | POA: Diagnosis not present

## 2016-01-10 DIAGNOSIS — C9000 Multiple myeloma not having achieved remission: Secondary | ICD-10-CM | POA: Diagnosis not present

## 2016-01-10 DIAGNOSIS — Z5181 Encounter for therapeutic drug level monitoring: Secondary | ICD-10-CM | POA: Diagnosis not present

## 2016-01-10 DIAGNOSIS — Z89512 Acquired absence of left leg below knee: Secondary | ICD-10-CM | POA: Diagnosis not present

## 2016-01-10 DIAGNOSIS — Z7984 Long term (current) use of oral hypoglycemic drugs: Secondary | ICD-10-CM | POA: Diagnosis not present

## 2016-01-10 DIAGNOSIS — Z86718 Personal history of other venous thrombosis and embolism: Secondary | ICD-10-CM | POA: Diagnosis not present

## 2016-01-10 DIAGNOSIS — E1151 Type 2 diabetes mellitus with diabetic peripheral angiopathy without gangrene: Secondary | ICD-10-CM | POA: Diagnosis not present

## 2016-01-10 DIAGNOSIS — M1991 Primary osteoarthritis, unspecified site: Secondary | ICD-10-CM | POA: Diagnosis not present

## 2016-01-10 DIAGNOSIS — T8789 Other complications of amputation stump: Secondary | ICD-10-CM | POA: Diagnosis not present

## 2016-01-10 DIAGNOSIS — Z79891 Long term (current) use of opiate analgesic: Secondary | ICD-10-CM | POA: Diagnosis not present

## 2016-01-10 DIAGNOSIS — I10 Essential (primary) hypertension: Secondary | ICD-10-CM | POA: Diagnosis not present

## 2016-01-11 DIAGNOSIS — Z79891 Long term (current) use of opiate analgesic: Secondary | ICD-10-CM | POA: Diagnosis not present

## 2016-01-11 DIAGNOSIS — C9000 Multiple myeloma not having achieved remission: Secondary | ICD-10-CM | POA: Diagnosis not present

## 2016-01-11 DIAGNOSIS — Z86718 Personal history of other venous thrombosis and embolism: Secondary | ICD-10-CM | POA: Diagnosis not present

## 2016-01-11 DIAGNOSIS — E1151 Type 2 diabetes mellitus with diabetic peripheral angiopathy without gangrene: Secondary | ICD-10-CM | POA: Diagnosis not present

## 2016-01-11 DIAGNOSIS — Z5181 Encounter for therapeutic drug level monitoring: Secondary | ICD-10-CM | POA: Diagnosis not present

## 2016-01-11 DIAGNOSIS — I10 Essential (primary) hypertension: Secondary | ICD-10-CM | POA: Diagnosis not present

## 2016-01-11 DIAGNOSIS — M1991 Primary osteoarthritis, unspecified site: Secondary | ICD-10-CM | POA: Diagnosis not present

## 2016-01-11 DIAGNOSIS — Z7901 Long term (current) use of anticoagulants: Secondary | ICD-10-CM | POA: Diagnosis not present

## 2016-01-11 DIAGNOSIS — Z89512 Acquired absence of left leg below knee: Secondary | ICD-10-CM | POA: Diagnosis not present

## 2016-01-11 DIAGNOSIS — T8789 Other complications of amputation stump: Secondary | ICD-10-CM | POA: Diagnosis not present

## 2016-01-11 DIAGNOSIS — Z7984 Long term (current) use of oral hypoglycemic drugs: Secondary | ICD-10-CM | POA: Diagnosis not present

## 2016-01-14 ENCOUNTER — Telehealth: Payer: Self-pay | Admitting: Family Medicine

## 2016-01-14 DIAGNOSIS — R627 Adult failure to thrive: Secondary | ICD-10-CM | POA: Diagnosis not present

## 2016-01-14 DIAGNOSIS — Z89512 Acquired absence of left leg below knee: Secondary | ICD-10-CM | POA: Diagnosis not present

## 2016-01-14 DIAGNOSIS — Z86718 Personal history of other venous thrombosis and embolism: Secondary | ICD-10-CM | POA: Diagnosis not present

## 2016-01-14 DIAGNOSIS — Z7901 Long term (current) use of anticoagulants: Secondary | ICD-10-CM | POA: Diagnosis not present

## 2016-01-14 DIAGNOSIS — Z7984 Long term (current) use of oral hypoglycemic drugs: Secondary | ICD-10-CM | POA: Diagnosis not present

## 2016-01-14 DIAGNOSIS — M1991 Primary osteoarthritis, unspecified site: Secondary | ICD-10-CM | POA: Diagnosis not present

## 2016-01-14 DIAGNOSIS — Z79891 Long term (current) use of opiate analgesic: Secondary | ICD-10-CM | POA: Diagnosis not present

## 2016-01-14 DIAGNOSIS — Z4789 Encounter for other orthopedic aftercare: Secondary | ICD-10-CM | POA: Diagnosis not present

## 2016-01-14 DIAGNOSIS — T8789 Other complications of amputation stump: Secondary | ICD-10-CM | POA: Diagnosis not present

## 2016-01-14 DIAGNOSIS — E1151 Type 2 diabetes mellitus with diabetic peripheral angiopathy without gangrene: Secondary | ICD-10-CM | POA: Diagnosis not present

## 2016-01-14 DIAGNOSIS — I10 Essential (primary) hypertension: Secondary | ICD-10-CM | POA: Diagnosis not present

## 2016-01-14 DIAGNOSIS — Z5181 Encounter for therapeutic drug level monitoring: Secondary | ICD-10-CM | POA: Diagnosis not present

## 2016-01-14 DIAGNOSIS — C9000 Multiple myeloma not having achieved remission: Secondary | ICD-10-CM | POA: Diagnosis not present

## 2016-01-14 NOTE — Telephone Encounter (Signed)
Please give the order.  Thanks.   

## 2016-01-14 NOTE — Telephone Encounter (Signed)
wes from [facility] called to get verbal orders for PT  Twice a week for 3 weeks  cb number is [phone]

## 2016-01-15 DIAGNOSIS — Z86718 Personal history of other venous thrombosis and embolism: Secondary | ICD-10-CM | POA: Diagnosis not present

## 2016-01-15 DIAGNOSIS — C9000 Multiple myeloma not having achieved remission: Secondary | ICD-10-CM | POA: Diagnosis not present

## 2016-01-15 DIAGNOSIS — Z5181 Encounter for therapeutic drug level monitoring: Secondary | ICD-10-CM | POA: Diagnosis not present

## 2016-01-15 DIAGNOSIS — T8789 Other complications of amputation stump: Secondary | ICD-10-CM | POA: Diagnosis not present

## 2016-01-15 DIAGNOSIS — M1991 Primary osteoarthritis, unspecified site: Secondary | ICD-10-CM | POA: Diagnosis not present

## 2016-01-15 DIAGNOSIS — Z7901 Long term (current) use of anticoagulants: Secondary | ICD-10-CM | POA: Diagnosis not present

## 2016-01-15 DIAGNOSIS — I10 Essential (primary) hypertension: Secondary | ICD-10-CM | POA: Diagnosis not present

## 2016-01-15 DIAGNOSIS — Z79891 Long term (current) use of opiate analgesic: Secondary | ICD-10-CM | POA: Diagnosis not present

## 2016-01-15 DIAGNOSIS — Z7984 Long term (current) use of oral hypoglycemic drugs: Secondary | ICD-10-CM | POA: Diagnosis not present

## 2016-01-15 DIAGNOSIS — E1151 Type 2 diabetes mellitus with diabetic peripheral angiopathy without gangrene: Secondary | ICD-10-CM | POA: Diagnosis not present

## 2016-01-15 DIAGNOSIS — Z89512 Acquired absence of left leg below knee: Secondary | ICD-10-CM | POA: Diagnosis not present

## 2016-01-15 NOTE — Telephone Encounter (Signed)
Left detailed message on voicemail. VM identified himself as Wes.

## 2016-01-16 ENCOUNTER — Other Ambulatory Visit: Payer: Self-pay | Admitting: *Deleted

## 2016-01-16 DIAGNOSIS — Z86718 Personal history of other venous thrombosis and embolism: Secondary | ICD-10-CM | POA: Diagnosis not present

## 2016-01-16 DIAGNOSIS — M1991 Primary osteoarthritis, unspecified site: Secondary | ICD-10-CM | POA: Diagnosis not present

## 2016-01-16 DIAGNOSIS — Z7901 Long term (current) use of anticoagulants: Secondary | ICD-10-CM | POA: Diagnosis not present

## 2016-01-16 DIAGNOSIS — Z79891 Long term (current) use of opiate analgesic: Secondary | ICD-10-CM | POA: Diagnosis not present

## 2016-01-16 DIAGNOSIS — I10 Essential (primary) hypertension: Secondary | ICD-10-CM | POA: Diagnosis not present

## 2016-01-16 DIAGNOSIS — Z5181 Encounter for therapeutic drug level monitoring: Secondary | ICD-10-CM | POA: Diagnosis not present

## 2016-01-16 DIAGNOSIS — E1151 Type 2 diabetes mellitus with diabetic peripheral angiopathy without gangrene: Secondary | ICD-10-CM | POA: Diagnosis not present

## 2016-01-16 DIAGNOSIS — Z89512 Acquired absence of left leg below knee: Secondary | ICD-10-CM | POA: Diagnosis not present

## 2016-01-16 DIAGNOSIS — C9000 Multiple myeloma not having achieved remission: Secondary | ICD-10-CM | POA: Diagnosis not present

## 2016-01-16 DIAGNOSIS — Z7984 Long term (current) use of oral hypoglycemic drugs: Secondary | ICD-10-CM | POA: Diagnosis not present

## 2016-01-16 DIAGNOSIS — T8789 Other complications of amputation stump: Secondary | ICD-10-CM | POA: Diagnosis not present

## 2016-01-16 MED ORDER — LISINOPRIL 5 MG PO TABS: 5.0000 mg | ORAL_TABLET | Freq: Every day | ORAL | Status: DC

## 2016-01-16 MED ORDER — HYDROCHLOROTHIAZIDE 12.5 MG PO CAPS: 12.5000 mg | ORAL_CAPSULE | Freq: Every day | ORAL | Status: DC

## 2016-01-16 NOTE — Telephone Encounter (Signed)
Daughter left message at Triage requesting refill of meds, done and daughter notified

## 2016-01-17 DIAGNOSIS — C9000 Multiple myeloma not having achieved remission: Secondary | ICD-10-CM | POA: Diagnosis not present

## 2016-01-17 DIAGNOSIS — T8789 Other complications of amputation stump: Secondary | ICD-10-CM | POA: Diagnosis not present

## 2016-01-17 DIAGNOSIS — Z7901 Long term (current) use of anticoagulants: Secondary | ICD-10-CM | POA: Diagnosis not present

## 2016-01-17 DIAGNOSIS — I10 Essential (primary) hypertension: Secondary | ICD-10-CM | POA: Diagnosis not present

## 2016-01-17 DIAGNOSIS — Z79891 Long term (current) use of opiate analgesic: Secondary | ICD-10-CM | POA: Diagnosis not present

## 2016-01-17 DIAGNOSIS — Z86718 Personal history of other venous thrombosis and embolism: Secondary | ICD-10-CM | POA: Diagnosis not present

## 2016-01-17 DIAGNOSIS — Z7984 Long term (current) use of oral hypoglycemic drugs: Secondary | ICD-10-CM | POA: Diagnosis not present

## 2016-01-17 DIAGNOSIS — Z5181 Encounter for therapeutic drug level monitoring: Secondary | ICD-10-CM | POA: Diagnosis not present

## 2016-01-17 DIAGNOSIS — M1991 Primary osteoarthritis, unspecified site: Secondary | ICD-10-CM | POA: Diagnosis not present

## 2016-01-17 DIAGNOSIS — Z89512 Acquired absence of left leg below knee: Secondary | ICD-10-CM | POA: Diagnosis not present

## 2016-01-17 DIAGNOSIS — E1151 Type 2 diabetes mellitus with diabetic peripheral angiopathy without gangrene: Secondary | ICD-10-CM | POA: Diagnosis not present

## 2016-01-21 DIAGNOSIS — Z7901 Long term (current) use of anticoagulants: Secondary | ICD-10-CM | POA: Diagnosis not present

## 2016-01-21 DIAGNOSIS — T8789 Other complications of amputation stump: Secondary | ICD-10-CM | POA: Diagnosis not present

## 2016-01-21 DIAGNOSIS — E1151 Type 2 diabetes mellitus with diabetic peripheral angiopathy without gangrene: Secondary | ICD-10-CM | POA: Diagnosis not present

## 2016-01-21 DIAGNOSIS — C9000 Multiple myeloma not having achieved remission: Secondary | ICD-10-CM | POA: Diagnosis not present

## 2016-01-21 DIAGNOSIS — Z89512 Acquired absence of left leg below knee: Secondary | ICD-10-CM | POA: Diagnosis not present

## 2016-01-21 DIAGNOSIS — Z79891 Long term (current) use of opiate analgesic: Secondary | ICD-10-CM | POA: Diagnosis not present

## 2016-01-21 DIAGNOSIS — M1991 Primary osteoarthritis, unspecified site: Secondary | ICD-10-CM | POA: Diagnosis not present

## 2016-01-21 DIAGNOSIS — Z7984 Long term (current) use of oral hypoglycemic drugs: Secondary | ICD-10-CM | POA: Diagnosis not present

## 2016-01-21 DIAGNOSIS — I10 Essential (primary) hypertension: Secondary | ICD-10-CM | POA: Diagnosis not present

## 2016-01-21 DIAGNOSIS — Z5181 Encounter for therapeutic drug level monitoring: Secondary | ICD-10-CM | POA: Diagnosis not present

## 2016-01-21 DIAGNOSIS — Z86718 Personal history of other venous thrombosis and embolism: Secondary | ICD-10-CM | POA: Diagnosis not present

## 2016-01-22 DIAGNOSIS — Z7984 Long term (current) use of oral hypoglycemic drugs: Secondary | ICD-10-CM | POA: Diagnosis not present

## 2016-01-22 DIAGNOSIS — I10 Essential (primary) hypertension: Secondary | ICD-10-CM | POA: Diagnosis not present

## 2016-01-22 DIAGNOSIS — C9000 Multiple myeloma not having achieved remission: Secondary | ICD-10-CM | POA: Diagnosis not present

## 2016-01-22 DIAGNOSIS — M1991 Primary osteoarthritis, unspecified site: Secondary | ICD-10-CM | POA: Diagnosis not present

## 2016-01-22 DIAGNOSIS — Z89512 Acquired absence of left leg below knee: Secondary | ICD-10-CM | POA: Diagnosis not present

## 2016-01-22 DIAGNOSIS — Z86718 Personal history of other venous thrombosis and embolism: Secondary | ICD-10-CM | POA: Diagnosis not present

## 2016-01-22 DIAGNOSIS — E1151 Type 2 diabetes mellitus with diabetic peripheral angiopathy without gangrene: Secondary | ICD-10-CM | POA: Diagnosis not present

## 2016-01-22 DIAGNOSIS — Z7901 Long term (current) use of anticoagulants: Secondary | ICD-10-CM | POA: Diagnosis not present

## 2016-01-22 DIAGNOSIS — T8789 Other complications of amputation stump: Secondary | ICD-10-CM | POA: Diagnosis not present

## 2016-01-22 DIAGNOSIS — Z5181 Encounter for therapeutic drug level monitoring: Secondary | ICD-10-CM | POA: Diagnosis not present

## 2016-01-22 DIAGNOSIS — Z79891 Long term (current) use of opiate analgesic: Secondary | ICD-10-CM | POA: Diagnosis not present

## 2016-01-24 DIAGNOSIS — C9000 Multiple myeloma not having achieved remission: Secondary | ICD-10-CM | POA: Diagnosis not present

## 2016-01-24 DIAGNOSIS — Z79891 Long term (current) use of opiate analgesic: Secondary | ICD-10-CM | POA: Diagnosis not present

## 2016-01-24 DIAGNOSIS — M1991 Primary osteoarthritis, unspecified site: Secondary | ICD-10-CM | POA: Diagnosis not present

## 2016-01-24 DIAGNOSIS — T8789 Other complications of amputation stump: Secondary | ICD-10-CM | POA: Diagnosis not present

## 2016-01-24 DIAGNOSIS — Z7901 Long term (current) use of anticoagulants: Secondary | ICD-10-CM | POA: Diagnosis not present

## 2016-01-24 DIAGNOSIS — I10 Essential (primary) hypertension: Secondary | ICD-10-CM | POA: Diagnosis not present

## 2016-01-24 DIAGNOSIS — E1151 Type 2 diabetes mellitus with diabetic peripheral angiopathy without gangrene: Secondary | ICD-10-CM | POA: Diagnosis not present

## 2016-01-24 DIAGNOSIS — Z5181 Encounter for therapeutic drug level monitoring: Secondary | ICD-10-CM | POA: Diagnosis not present

## 2016-01-24 DIAGNOSIS — Z7984 Long term (current) use of oral hypoglycemic drugs: Secondary | ICD-10-CM | POA: Diagnosis not present

## 2016-01-24 DIAGNOSIS — Z89512 Acquired absence of left leg below knee: Secondary | ICD-10-CM | POA: Diagnosis not present

## 2016-01-24 DIAGNOSIS — Z86718 Personal history of other venous thrombosis and embolism: Secondary | ICD-10-CM | POA: Diagnosis not present

## 2016-01-29 ENCOUNTER — Other Ambulatory Visit: Payer: Self-pay

## 2016-01-29 DIAGNOSIS — Z86718 Personal history of other venous thrombosis and embolism: Secondary | ICD-10-CM | POA: Diagnosis not present

## 2016-01-29 DIAGNOSIS — Z89512 Acquired absence of left leg below knee: Secondary | ICD-10-CM | POA: Diagnosis not present

## 2016-01-29 DIAGNOSIS — E1151 Type 2 diabetes mellitus with diabetic peripheral angiopathy without gangrene: Secondary | ICD-10-CM | POA: Diagnosis not present

## 2016-01-29 DIAGNOSIS — Z79891 Long term (current) use of opiate analgesic: Secondary | ICD-10-CM | POA: Diagnosis not present

## 2016-01-29 DIAGNOSIS — Z5181 Encounter for therapeutic drug level monitoring: Secondary | ICD-10-CM | POA: Diagnosis not present

## 2016-01-29 DIAGNOSIS — Z7901 Long term (current) use of anticoagulants: Secondary | ICD-10-CM | POA: Diagnosis not present

## 2016-01-29 DIAGNOSIS — T8789 Other complications of amputation stump: Secondary | ICD-10-CM | POA: Diagnosis not present

## 2016-01-29 DIAGNOSIS — M1991 Primary osteoarthritis, unspecified site: Secondary | ICD-10-CM | POA: Diagnosis not present

## 2016-01-29 DIAGNOSIS — C9000 Multiple myeloma not having achieved remission: Secondary | ICD-10-CM | POA: Diagnosis not present

## 2016-01-29 DIAGNOSIS — Z7984 Long term (current) use of oral hypoglycemic drugs: Secondary | ICD-10-CM | POA: Diagnosis not present

## 2016-01-29 DIAGNOSIS — I10 Essential (primary) hypertension: Secondary | ICD-10-CM | POA: Diagnosis not present

## 2016-01-29 NOTE — Telephone Encounter (Signed)
[name] [facility] signed) left v/m requesting refill for diclofenac sodium topical gel 1 % (given to pt at [facility]) do not see on current or hx med list.Please advise. [facility] [place].

## 2016-01-30 MED ORDER — DICLOFENAC SODIUM 1 % TD GEL: 2.0000 g | Freq: Four times a day (QID) | TRANSDERMAL | Status: DC | PRN

## 2016-01-30 NOTE — Telephone Encounter (Signed)
[name] notified as instructed by telephone and verbalized understanding.

## 2016-01-30 NOTE — Telephone Encounter (Signed)
Would be okay to use on joints that ache.  Sent.  Thanks.

## 2016-01-31 ENCOUNTER — Telehealth: Payer: Self-pay

## 2016-01-31 DIAGNOSIS — E1151 Type 2 diabetes mellitus with diabetic peripheral angiopathy without gangrene: Secondary | ICD-10-CM | POA: Diagnosis not present

## 2016-01-31 DIAGNOSIS — T8789 Other complications of amputation stump: Secondary | ICD-10-CM | POA: Diagnosis not present

## 2016-01-31 DIAGNOSIS — C9000 Multiple myeloma not having achieved remission: Secondary | ICD-10-CM | POA: Diagnosis not present

## 2016-01-31 DIAGNOSIS — Z79891 Long term (current) use of opiate analgesic: Secondary | ICD-10-CM | POA: Diagnosis not present

## 2016-01-31 DIAGNOSIS — Z7901 Long term (current) use of anticoagulants: Secondary | ICD-10-CM | POA: Diagnosis not present

## 2016-01-31 DIAGNOSIS — Z7984 Long term (current) use of oral hypoglycemic drugs: Secondary | ICD-10-CM | POA: Diagnosis not present

## 2016-01-31 DIAGNOSIS — Z5181 Encounter for therapeutic drug level monitoring: Secondary | ICD-10-CM | POA: Diagnosis not present

## 2016-01-31 DIAGNOSIS — Z86718 Personal history of other venous thrombosis and embolism: Secondary | ICD-10-CM | POA: Diagnosis not present

## 2016-01-31 DIAGNOSIS — Z89512 Acquired absence of left leg below knee: Secondary | ICD-10-CM | POA: Diagnosis not present

## 2016-01-31 DIAGNOSIS — I10 Essential (primary) hypertension: Secondary | ICD-10-CM | POA: Diagnosis not present

## 2016-01-31 DIAGNOSIS — M1991 Primary osteoarthritis, unspecified site: Secondary | ICD-10-CM | POA: Diagnosis not present

## 2016-01-31 NOTE — Telephone Encounter (Signed)
[name], visiting nurse with pt said pt needs a new glucose meter,test strips, and lancets. Pt checks BS once daily. [name] will ask family to ck with ins to see what glucose monitor, test strips and lancets are approved and then family will cb with that info.

## 2016-02-04 ENCOUNTER — Telehealth: Payer: Self-pay | Admitting: *Deleted

## 2016-02-04 NOTE — Telephone Encounter (Signed)
CORRECTION:  Incorrect information below.  PA sent thru [facility] for Diclofenac 1% Gel.  Approved thru 08/31/2016.

## 2016-02-04 NOTE — Telephone Encounter (Signed)
PA sent for Advair thru [facility].  Response:  No PA needed.  Faxed to pharmacy and scanned.

## 2016-02-05 ENCOUNTER — Telehealth: Payer: Self-pay | Admitting: *Deleted

## 2016-02-05 MED ORDER — ONETOUCH LANCETS MISC: Status: DC

## 2016-02-05 MED ORDER — GLUCOSE BLOOD VI STRP: ORAL_STRIP | Status: DC

## 2016-02-05 NOTE — Telephone Encounter (Signed)
Sent. Thanks.   

## 2016-02-05 NOTE — Telephone Encounter (Signed)
Patient's daughter left a voicemail wanting to let Dr. [name] know that patient has now returned from [facility] and is checking her blood sugar once daily. [name] stated that her mom needs new script to go to the pharmacy for one touch ultra 2 test strips and needles. [name] stated that her mom has a one touch ultra two meter. Patient's daughter requested that supplies be sent to [facility]. Items added to medication list for script to be filled.

## 2016-02-06 DIAGNOSIS — Z89512 Acquired absence of left leg below knee: Secondary | ICD-10-CM | POA: Diagnosis not present

## 2016-02-06 DIAGNOSIS — T8789 Other complications of amputation stump: Secondary | ICD-10-CM | POA: Diagnosis not present

## 2016-02-06 DIAGNOSIS — Z7901 Long term (current) use of anticoagulants: Secondary | ICD-10-CM | POA: Diagnosis not present

## 2016-02-06 DIAGNOSIS — I10 Essential (primary) hypertension: Secondary | ICD-10-CM | POA: Diagnosis not present

## 2016-02-06 DIAGNOSIS — Z79891 Long term (current) use of opiate analgesic: Secondary | ICD-10-CM | POA: Diagnosis not present

## 2016-02-06 DIAGNOSIS — Z5181 Encounter for therapeutic drug level monitoring: Secondary | ICD-10-CM | POA: Diagnosis not present

## 2016-02-06 DIAGNOSIS — E1151 Type 2 diabetes mellitus with diabetic peripheral angiopathy without gangrene: Secondary | ICD-10-CM | POA: Diagnosis not present

## 2016-02-06 DIAGNOSIS — M1991 Primary osteoarthritis, unspecified site: Secondary | ICD-10-CM | POA: Diagnosis not present

## 2016-02-06 DIAGNOSIS — Z86718 Personal history of other venous thrombosis and embolism: Secondary | ICD-10-CM | POA: Diagnosis not present

## 2016-02-06 DIAGNOSIS — C9000 Multiple myeloma not having achieved remission: Secondary | ICD-10-CM | POA: Diagnosis not present

## 2016-02-06 DIAGNOSIS — Z7984 Long term (current) use of oral hypoglycemic drugs: Secondary | ICD-10-CM | POA: Diagnosis not present

## 2016-02-07 ENCOUNTER — Other Ambulatory Visit: Payer: Self-pay | Admitting: *Deleted

## 2016-02-07 ENCOUNTER — Encounter: Payer: Self-pay | Admitting: Vascular Surgery

## 2016-02-07 DIAGNOSIS — I10 Essential (primary) hypertension: Secondary | ICD-10-CM | POA: Diagnosis not present

## 2016-02-07 DIAGNOSIS — M1991 Primary osteoarthritis, unspecified site: Secondary | ICD-10-CM | POA: Diagnosis not present

## 2016-02-07 DIAGNOSIS — Z5181 Encounter for therapeutic drug level monitoring: Secondary | ICD-10-CM | POA: Diagnosis not present

## 2016-02-07 DIAGNOSIS — Z7901 Long term (current) use of anticoagulants: Secondary | ICD-10-CM | POA: Diagnosis not present

## 2016-02-07 DIAGNOSIS — Z89512 Acquired absence of left leg below knee: Secondary | ICD-10-CM | POA: Diagnosis not present

## 2016-02-07 DIAGNOSIS — Z79891 Long term (current) use of opiate analgesic: Secondary | ICD-10-CM | POA: Diagnosis not present

## 2016-02-07 DIAGNOSIS — C9000 Multiple myeloma not having achieved remission: Secondary | ICD-10-CM | POA: Diagnosis not present

## 2016-02-07 DIAGNOSIS — Z86718 Personal history of other venous thrombosis and embolism: Secondary | ICD-10-CM | POA: Diagnosis not present

## 2016-02-07 DIAGNOSIS — Z7984 Long term (current) use of oral hypoglycemic drugs: Secondary | ICD-10-CM | POA: Diagnosis not present

## 2016-02-07 DIAGNOSIS — T8789 Other complications of amputation stump: Secondary | ICD-10-CM | POA: Diagnosis not present

## 2016-02-07 DIAGNOSIS — E1151 Type 2 diabetes mellitus with diabetic peripheral angiopathy without gangrene: Secondary | ICD-10-CM | POA: Diagnosis not present

## 2016-02-07 MED ORDER — ONETOUCH LANCETS MISC: Status: DC

## 2016-02-12 DIAGNOSIS — Z79891 Long term (current) use of opiate analgesic: Secondary | ICD-10-CM | POA: Diagnosis not present

## 2016-02-12 DIAGNOSIS — Z7984 Long term (current) use of oral hypoglycemic drugs: Secondary | ICD-10-CM | POA: Diagnosis not present

## 2016-02-12 DIAGNOSIS — T8789 Other complications of amputation stump: Secondary | ICD-10-CM | POA: Diagnosis not present

## 2016-02-12 DIAGNOSIS — M1991 Primary osteoarthritis, unspecified site: Secondary | ICD-10-CM | POA: Diagnosis not present

## 2016-02-12 DIAGNOSIS — Z86718 Personal history of other venous thrombosis and embolism: Secondary | ICD-10-CM | POA: Diagnosis not present

## 2016-02-12 DIAGNOSIS — Z89512 Acquired absence of left leg below knee: Secondary | ICD-10-CM | POA: Diagnosis not present

## 2016-02-12 DIAGNOSIS — E1151 Type 2 diabetes mellitus with diabetic peripheral angiopathy without gangrene: Secondary | ICD-10-CM | POA: Diagnosis not present

## 2016-02-12 DIAGNOSIS — Z7901 Long term (current) use of anticoagulants: Secondary | ICD-10-CM | POA: Diagnosis not present

## 2016-02-12 DIAGNOSIS — Z5181 Encounter for therapeutic drug level monitoring: Secondary | ICD-10-CM | POA: Diagnosis not present

## 2016-02-12 DIAGNOSIS — C9000 Multiple myeloma not having achieved remission: Secondary | ICD-10-CM | POA: Diagnosis not present

## 2016-02-12 DIAGNOSIS — I10 Essential (primary) hypertension: Secondary | ICD-10-CM | POA: Diagnosis not present

## 2016-02-13 ENCOUNTER — Encounter: Payer: Self-pay | Admitting: Vascular Surgery

## 2016-02-13 ENCOUNTER — Ambulatory Visit (INDEPENDENT_AMBULATORY_CARE_PROVIDER_SITE_OTHER): Payer: Medicare Other | Admitting: Vascular Surgery

## 2016-02-13 VITALS — BP 125/75 | HR 68 | Temp 98.4°F | Ht 62.0 in | Wt 105.0 lb

## 2016-02-13 DIAGNOSIS — Z48812 Encounter for surgical aftercare following surgery on the circulatory system: Secondary | ICD-10-CM

## 2016-02-13 NOTE — Progress Notes (Signed)
Patient name: [name] MRN: [id number] DOB: 02-Jan-1936 Sex: female  REASON FOR VISIT: follow up of left below the knee amputation  HPI: [name] is a 80 y.o. female who underwent excisional debridement of a left below the knee amputation and placement of a negative pressure dressing on 11/16/2015. I last saw her on 01/02/2016. The wound was improving rapidly. I plan on seeing her back in 6 weeks. Since I saw her last, the left BKA has completely healed. She's going to get her casting today for her left BKA prosthesis. She denies any pain or wounds in the right leg.  Current Outpatient Prescriptions  Medication Sig Dispense Refill  . acyclovir (ZOVIRAX) 400 MG tablet Take 1 tablet (400 mg total) by mouth 2 (two) times daily. 30 tablet 0  . ALPRAZolam (XANAX) 0.5 MG tablet Take 1 tablet (0.5 mg total) by mouth 2 (two) times daily as needed. for anxiety 60 tablet 2  . amLODipine (NORVASC) 5 MG tablet Take 1 tablet (5 mg total) by mouth daily. 30 tablet 5  . Calcium Carbonate-Vitamin D 600-400 MG-UNIT tablet Take 1 tablet by mouth daily. 30 tablet 1  . clopidogrel (PLAVIX) 75 MG tablet Take 1 tablet (75 mg total) by mouth daily. 30 tablet 0  . diclofenac sodium (VOLTAREN) 1 % GEL Apply 2 g topically 4 (four) times daily as needed (for joint aches). 100 g 1  . glipiZIDE (GLUCOTROL XL) 2.5 MG 24 hr tablet Take 1 tablet (2.5 mg total) by mouth daily. 30 tablet 5  . glucose blood (ONE TOUCH ULTRA TEST) test strip One touch ultra 2 test strips.  Use daily.  Dx e11.9. 100 each 3  . hydrochlorothiazide (MICROZIDE) 12.5 MG capsule Take 1 capsule (12.5 mg total) by mouth daily. 30 capsule 5  . lisinopril (PRINIVIL,ZESTRIL) 5 MG tablet Take 1 tablet (5 mg total) by mouth daily. 30 tablet 5  . ONE TOUCH LANCETS MISC USE TO CHECK BLOOD SUGAR TWO TIMES DAILY.    DX:  E11.9 200 each 3  . pantoprazole (PROTONIX) 40 MG tablet Take 1 tablet (40 mg total) by mouth daily. 30 tablet 5  . potassium chloride  SA (K-DUR,KLOR-CON) 20 MEQ tablet Take 1 tablet (20 mEq total) by mouth daily. 30 tablet 5  . traMADol (ULTRAM) 50 MG tablet Take 1 tablet (50 mg total) by mouth every 6 (six) hours as needed for moderate pain. 100 tablet 2  . warfarin (COUMADIN) 5 MG tablet Take 1 tablet (5 mg total) by mouth daily. 30 tablet 5   No current facility-administered medications for this visit.   Facility-Administered Medications Ordered in Other Visits  Medication Dose Route Frequency Provider Last Rate Last Dose  . 0.9 %  sodium chloride infusion   Intravenous Once Curt Bears, MD   Stopped at 03/21/15 1720  . 0.9 %  sodium chloride infusion   Intravenous Once Curt Bears, MD      . 0.9 %  sodium chloride infusion   Intravenous Once Curt Bears, MD   Stopped at 07/31/15 1638    REVIEW OF SYSTEMS:  '[X]'$  denotes positive finding, '[ ]'$  denotes negative finding Cardiac  Comments:  Chest pain or chest pressure:    Shortness of breath upon exertion:    Short of breath when lying flat:    Irregular heart rhythm:    Constitutional    Fever or chills:      PHYSICAL EXAM: Filed Vitals:   02/13/16 1107  BP: 125/75  Pulse: 68  Temp: 98.4 F (36.9 C)  TempSrc: Oral  Height: '5\' 2"'$  (1.575 m)  Weight: 105 lb (47.628 kg)  SpO2: 100%    GENERAL: The patient is a well-nourished female, in no acute distress. The vital signs are documented above. CARDIOVASCULAR: There is a regular rate and rhythm. PULMONARY: There is good air exchange bilaterally without wheezing or rales. Her left BKA has completely healed The left BKA is warm and well-perfused.  MEDICAL ISSUES:  STATUS POST LEFT BELOW THE KNEE AMPUTATION: The patient is doing well status post below the knee amputation on the left. She is being fitted for her prosthesis. I will see her back in 6 months with ABIs on the right so we can make sure she does not develop any problems on the right side. She is to call sooner if she has problems.  [name] [facility] [place] [phone]

## 2016-02-14 DIAGNOSIS — Z4789 Encounter for other orthopedic aftercare: Secondary | ICD-10-CM | POA: Diagnosis not present

## 2016-02-14 DIAGNOSIS — R627 Adult failure to thrive: Secondary | ICD-10-CM | POA: Diagnosis not present

## 2016-02-18 DIAGNOSIS — Z7901 Long term (current) use of anticoagulants: Secondary | ICD-10-CM | POA: Diagnosis not present

## 2016-02-18 DIAGNOSIS — Z86718 Personal history of other venous thrombosis and embolism: Secondary | ICD-10-CM | POA: Diagnosis not present

## 2016-02-18 DIAGNOSIS — Z5181 Encounter for therapeutic drug level monitoring: Secondary | ICD-10-CM | POA: Diagnosis not present

## 2016-02-18 DIAGNOSIS — Z79891 Long term (current) use of opiate analgesic: Secondary | ICD-10-CM | POA: Diagnosis not present

## 2016-02-18 DIAGNOSIS — Z7984 Long term (current) use of oral hypoglycemic drugs: Secondary | ICD-10-CM | POA: Diagnosis not present

## 2016-02-18 DIAGNOSIS — I10 Essential (primary) hypertension: Secondary | ICD-10-CM | POA: Diagnosis not present

## 2016-02-18 DIAGNOSIS — Z89512 Acquired absence of left leg below knee: Secondary | ICD-10-CM | POA: Diagnosis not present

## 2016-02-18 DIAGNOSIS — T8789 Other complications of amputation stump: Secondary | ICD-10-CM | POA: Diagnosis not present

## 2016-02-18 DIAGNOSIS — E1151 Type 2 diabetes mellitus with diabetic peripheral angiopathy without gangrene: Secondary | ICD-10-CM | POA: Diagnosis not present

## 2016-02-18 DIAGNOSIS — M1991 Primary osteoarthritis, unspecified site: Secondary | ICD-10-CM | POA: Diagnosis not present

## 2016-02-18 DIAGNOSIS — C9000 Multiple myeloma not having achieved remission: Secondary | ICD-10-CM | POA: Diagnosis not present

## 2016-02-21 DIAGNOSIS — C9000 Multiple myeloma not having achieved remission: Secondary | ICD-10-CM | POA: Diagnosis not present

## 2016-02-21 DIAGNOSIS — Z7901 Long term (current) use of anticoagulants: Secondary | ICD-10-CM | POA: Diagnosis not present

## 2016-02-21 DIAGNOSIS — M1991 Primary osteoarthritis, unspecified site: Secondary | ICD-10-CM | POA: Diagnosis not present

## 2016-02-21 DIAGNOSIS — T8789 Other complications of amputation stump: Secondary | ICD-10-CM | POA: Diagnosis not present

## 2016-02-21 DIAGNOSIS — Z89512 Acquired absence of left leg below knee: Secondary | ICD-10-CM | POA: Diagnosis not present

## 2016-02-21 DIAGNOSIS — E1151 Type 2 diabetes mellitus with diabetic peripheral angiopathy without gangrene: Secondary | ICD-10-CM | POA: Diagnosis not present

## 2016-02-21 DIAGNOSIS — Z86718 Personal history of other venous thrombosis and embolism: Secondary | ICD-10-CM | POA: Diagnosis not present

## 2016-02-21 DIAGNOSIS — Z79891 Long term (current) use of opiate analgesic: Secondary | ICD-10-CM | POA: Diagnosis not present

## 2016-02-21 DIAGNOSIS — Z5181 Encounter for therapeutic drug level monitoring: Secondary | ICD-10-CM | POA: Diagnosis not present

## 2016-02-21 DIAGNOSIS — I10 Essential (primary) hypertension: Secondary | ICD-10-CM | POA: Diagnosis not present

## 2016-02-21 DIAGNOSIS — Z7984 Long term (current) use of oral hypoglycemic drugs: Secondary | ICD-10-CM | POA: Diagnosis not present

## 2016-03-10 ENCOUNTER — Other Ambulatory Visit: Payer: Self-pay | Admitting: Family Medicine

## 2016-03-10 NOTE — Telephone Encounter (Signed)
Electronic refill request. Last Filled:    100 tablet 2 01/06/2016  Please advise.

## 2016-03-11 ENCOUNTER — Other Ambulatory Visit: Payer: Self-pay | Admitting: Family Medicine

## 2016-03-11 NOTE — Telephone Encounter (Signed)
Medication phoned to pharmacy.  

## 2016-03-11 NOTE — Telephone Encounter (Signed)
Please call in.  Thanks.   

## 2016-03-13 ENCOUNTER — Telehealth: Payer: Self-pay | Admitting: Family Medicine

## 2016-03-13 ENCOUNTER — Telehealth: Payer: Self-pay | Admitting: *Deleted

## 2016-03-13 NOTE — Telephone Encounter (Signed)
FMLA forms dropped off by/for, pt daughter, Virgilene Stryker. Forms in Dr. [name] Inbox for review, completion and signature. Please return to [place] when complete

## 2016-03-13 NOTE — Telephone Encounter (Signed)
LMTCB; need more info as to what PT is needed. Patient was getting a prosthesis and so the PT would be arranged by the company such as [employer]. Will wait for a return phone call.

## 2016-03-13 NOTE — Telephone Encounter (Signed)
-----   Message from Georgiann Mccoy sent at 03/12/2016  4:45 PM EDT ----- Regarding: new perscription This pt of [facility] needs a new order for physical therapy sent to their insurance.  The company that does her physical therapy is [facility].  Please contact Roanna Reaves at [phone].  Thank you, Selinda Eon

## 2016-03-14 NOTE — Telephone Encounter (Signed)
I'll work on the hard copy.  Thanks.  

## 2016-03-15 DIAGNOSIS — R627 Adult failure to thrive: Secondary | ICD-10-CM | POA: Diagnosis not present

## 2016-03-15 DIAGNOSIS — Z4789 Encounter for other orthopedic aftercare: Secondary | ICD-10-CM | POA: Diagnosis not present

## 2016-03-17 NOTE — Telephone Encounter (Signed)
Left message asking [name] to call office

## 2016-03-19 DIAGNOSIS — Z89512 Acquired absence of left leg below knee: Secondary | ICD-10-CM | POA: Diagnosis not present

## 2016-03-24 ENCOUNTER — Telehealth: Payer: Self-pay | Admitting: Family Medicine

## 2016-03-24 NOTE — Telephone Encounter (Signed)
Yes, I last saw patient 12/24/15.  I was planning on seeing her again this fall.  Does she need to be seen sooner?  Is there an unaddressed issue that she would need to come in for now?  If so, then schedule.  O/w, recheck this fall.  Thanks.

## 2016-03-24 NOTE — Telephone Encounter (Signed)
Spoke with [name] and advised.  [name] says she is not aware of any unaddressed issues.  HH-PT will be going out for evaluation tomorrow so orders may come in for approval.

## 2016-03-24 NOTE — Telephone Encounter (Signed)
Darlina Guys @ [facility] called wanting to make sure dr [name] will be signing orders for pt  Bets number [phone]

## 2016-03-25 DIAGNOSIS — Z86718 Personal history of other venous thrombosis and embolism: Secondary | ICD-10-CM | POA: Diagnosis not present

## 2016-03-25 DIAGNOSIS — M1991 Primary osteoarthritis, unspecified site: Secondary | ICD-10-CM | POA: Diagnosis not present

## 2016-03-25 DIAGNOSIS — Z9714 Presence of artificial left leg (complete) (partial): Secondary | ICD-10-CM | POA: Diagnosis not present

## 2016-03-25 DIAGNOSIS — Z7984 Long term (current) use of oral hypoglycemic drugs: Secondary | ICD-10-CM | POA: Diagnosis not present

## 2016-03-25 DIAGNOSIS — Z7901 Long term (current) use of anticoagulants: Secondary | ICD-10-CM | POA: Diagnosis not present

## 2016-03-25 DIAGNOSIS — E1151 Type 2 diabetes mellitus with diabetic peripheral angiopathy without gangrene: Secondary | ICD-10-CM | POA: Diagnosis not present

## 2016-03-25 DIAGNOSIS — Z79891 Long term (current) use of opiate analgesic: Secondary | ICD-10-CM | POA: Diagnosis not present

## 2016-03-25 DIAGNOSIS — I10 Essential (primary) hypertension: Secondary | ICD-10-CM | POA: Diagnosis not present

## 2016-03-25 DIAGNOSIS — Z4781 Encounter for orthopedic aftercare following surgical amputation: Secondary | ICD-10-CM | POA: Diagnosis not present

## 2016-03-25 DIAGNOSIS — C9000 Multiple myeloma not having achieved remission: Secondary | ICD-10-CM | POA: Diagnosis not present

## 2016-03-25 DIAGNOSIS — Z89512 Acquired absence of left leg below knee: Secondary | ICD-10-CM | POA: Diagnosis not present

## 2016-03-27 DIAGNOSIS — E1151 Type 2 diabetes mellitus with diabetic peripheral angiopathy without gangrene: Secondary | ICD-10-CM | POA: Diagnosis not present

## 2016-03-27 DIAGNOSIS — Z79891 Long term (current) use of opiate analgesic: Secondary | ICD-10-CM | POA: Diagnosis not present

## 2016-03-27 DIAGNOSIS — M1991 Primary osteoarthritis, unspecified site: Secondary | ICD-10-CM | POA: Diagnosis not present

## 2016-03-27 DIAGNOSIS — C9000 Multiple myeloma not having achieved remission: Secondary | ICD-10-CM | POA: Diagnosis not present

## 2016-03-27 DIAGNOSIS — Z7901 Long term (current) use of anticoagulants: Secondary | ICD-10-CM | POA: Diagnosis not present

## 2016-03-27 DIAGNOSIS — Z4781 Encounter for orthopedic aftercare following surgical amputation: Secondary | ICD-10-CM | POA: Diagnosis not present

## 2016-03-27 DIAGNOSIS — Z89512 Acquired absence of left leg below knee: Secondary | ICD-10-CM | POA: Diagnosis not present

## 2016-03-27 DIAGNOSIS — I10 Essential (primary) hypertension: Secondary | ICD-10-CM | POA: Diagnosis not present

## 2016-03-27 DIAGNOSIS — Z9714 Presence of artificial left leg (complete) (partial): Secondary | ICD-10-CM | POA: Diagnosis not present

## 2016-03-27 DIAGNOSIS — Z7984 Long term (current) use of oral hypoglycemic drugs: Secondary | ICD-10-CM | POA: Diagnosis not present

## 2016-03-27 DIAGNOSIS — Z86718 Personal history of other venous thrombosis and embolism: Secondary | ICD-10-CM | POA: Diagnosis not present

## 2016-03-27 NOTE — Addendum Note (Signed)
Addended by: Reola Calkins on: 03/27/2016 03:21 PM   Modules accepted: Orders

## 2016-04-01 DIAGNOSIS — C9000 Multiple myeloma not having achieved remission: Secondary | ICD-10-CM | POA: Diagnosis not present

## 2016-04-01 DIAGNOSIS — Z4781 Encounter for orthopedic aftercare following surgical amputation: Secondary | ICD-10-CM | POA: Diagnosis not present

## 2016-04-01 DIAGNOSIS — Z7984 Long term (current) use of oral hypoglycemic drugs: Secondary | ICD-10-CM | POA: Diagnosis not present

## 2016-04-01 DIAGNOSIS — Z86718 Personal history of other venous thrombosis and embolism: Secondary | ICD-10-CM | POA: Diagnosis not present

## 2016-04-01 DIAGNOSIS — E1151 Type 2 diabetes mellitus with diabetic peripheral angiopathy without gangrene: Secondary | ICD-10-CM | POA: Diagnosis not present

## 2016-04-01 DIAGNOSIS — Z9714 Presence of artificial left leg (complete) (partial): Secondary | ICD-10-CM | POA: Diagnosis not present

## 2016-04-01 DIAGNOSIS — Z7901 Long term (current) use of anticoagulants: Secondary | ICD-10-CM | POA: Diagnosis not present

## 2016-04-01 DIAGNOSIS — Z79891 Long term (current) use of opiate analgesic: Secondary | ICD-10-CM | POA: Diagnosis not present

## 2016-04-01 DIAGNOSIS — M1991 Primary osteoarthritis, unspecified site: Secondary | ICD-10-CM | POA: Diagnosis not present

## 2016-04-01 DIAGNOSIS — Z89512 Acquired absence of left leg below knee: Secondary | ICD-10-CM | POA: Diagnosis not present

## 2016-04-01 DIAGNOSIS — I10 Essential (primary) hypertension: Secondary | ICD-10-CM | POA: Diagnosis not present

## 2016-04-03 DIAGNOSIS — Z7901 Long term (current) use of anticoagulants: Secondary | ICD-10-CM | POA: Diagnosis not present

## 2016-04-03 DIAGNOSIS — I10 Essential (primary) hypertension: Secondary | ICD-10-CM | POA: Diagnosis not present

## 2016-04-03 DIAGNOSIS — Z79891 Long term (current) use of opiate analgesic: Secondary | ICD-10-CM | POA: Diagnosis not present

## 2016-04-03 DIAGNOSIS — Z7984 Long term (current) use of oral hypoglycemic drugs: Secondary | ICD-10-CM | POA: Diagnosis not present

## 2016-04-03 DIAGNOSIS — Z9714 Presence of artificial left leg (complete) (partial): Secondary | ICD-10-CM | POA: Diagnosis not present

## 2016-04-03 DIAGNOSIS — E1151 Type 2 diabetes mellitus with diabetic peripheral angiopathy without gangrene: Secondary | ICD-10-CM | POA: Diagnosis not present

## 2016-04-03 DIAGNOSIS — Z4781 Encounter for orthopedic aftercare following surgical amputation: Secondary | ICD-10-CM | POA: Diagnosis not present

## 2016-04-03 DIAGNOSIS — C9000 Multiple myeloma not having achieved remission: Secondary | ICD-10-CM | POA: Diagnosis not present

## 2016-04-03 DIAGNOSIS — Z86718 Personal history of other venous thrombosis and embolism: Secondary | ICD-10-CM | POA: Diagnosis not present

## 2016-04-03 DIAGNOSIS — M1991 Primary osteoarthritis, unspecified site: Secondary | ICD-10-CM | POA: Diagnosis not present

## 2016-04-03 DIAGNOSIS — Z89512 Acquired absence of left leg below knee: Secondary | ICD-10-CM | POA: Diagnosis not present

## 2016-04-08 ENCOUNTER — Other Ambulatory Visit: Payer: Self-pay | Admitting: Family Medicine

## 2016-04-08 DIAGNOSIS — Z86718 Personal history of other venous thrombosis and embolism: Secondary | ICD-10-CM | POA: Diagnosis not present

## 2016-04-08 DIAGNOSIS — M1991 Primary osteoarthritis, unspecified site: Secondary | ICD-10-CM | POA: Diagnosis not present

## 2016-04-08 DIAGNOSIS — Z7901 Long term (current) use of anticoagulants: Secondary | ICD-10-CM | POA: Diagnosis not present

## 2016-04-08 DIAGNOSIS — Z4781 Encounter for orthopedic aftercare following surgical amputation: Secondary | ICD-10-CM | POA: Diagnosis not present

## 2016-04-08 DIAGNOSIS — E1151 Type 2 diabetes mellitus with diabetic peripheral angiopathy without gangrene: Secondary | ICD-10-CM | POA: Diagnosis not present

## 2016-04-08 DIAGNOSIS — Z7984 Long term (current) use of oral hypoglycemic drugs: Secondary | ICD-10-CM | POA: Diagnosis not present

## 2016-04-08 DIAGNOSIS — Z9714 Presence of artificial left leg (complete) (partial): Secondary | ICD-10-CM | POA: Diagnosis not present

## 2016-04-08 DIAGNOSIS — C9000 Multiple myeloma not having achieved remission: Secondary | ICD-10-CM | POA: Diagnosis not present

## 2016-04-08 DIAGNOSIS — Z79891 Long term (current) use of opiate analgesic: Secondary | ICD-10-CM | POA: Diagnosis not present

## 2016-04-08 DIAGNOSIS — I10 Essential (primary) hypertension: Secondary | ICD-10-CM | POA: Diagnosis not present

## 2016-04-08 DIAGNOSIS — Z89512 Acquired absence of left leg below knee: Secondary | ICD-10-CM | POA: Diagnosis not present

## 2016-04-08 NOTE — Telephone Encounter (Signed)
Electronic refill request. Last Filled:    60 tablet 2 01/07/2016  Please advise.

## 2016-04-09 ENCOUNTER — Other Ambulatory Visit: Payer: Self-pay | Admitting: Family Medicine

## 2016-04-09 NOTE — Telephone Encounter (Signed)
I think this was already addressed. Please call in if not already done. Thanks.

## 2016-04-09 NOTE — Telephone Encounter (Signed)
Phoned in to [facility]

## 2016-04-09 NOTE — Telephone Encounter (Signed)
Please call in.  Thanks.   

## 2016-04-10 DIAGNOSIS — E1151 Type 2 diabetes mellitus with diabetic peripheral angiopathy without gangrene: Secondary | ICD-10-CM | POA: Diagnosis not present

## 2016-04-10 DIAGNOSIS — Z79891 Long term (current) use of opiate analgesic: Secondary | ICD-10-CM | POA: Diagnosis not present

## 2016-04-10 DIAGNOSIS — C9000 Multiple myeloma not having achieved remission: Secondary | ICD-10-CM | POA: Diagnosis not present

## 2016-04-10 DIAGNOSIS — Z7901 Long term (current) use of anticoagulants: Secondary | ICD-10-CM | POA: Diagnosis not present

## 2016-04-10 DIAGNOSIS — M1991 Primary osteoarthritis, unspecified site: Secondary | ICD-10-CM | POA: Diagnosis not present

## 2016-04-10 DIAGNOSIS — I10 Essential (primary) hypertension: Secondary | ICD-10-CM | POA: Diagnosis not present

## 2016-04-10 DIAGNOSIS — Z89512 Acquired absence of left leg below knee: Secondary | ICD-10-CM | POA: Diagnosis not present

## 2016-04-10 DIAGNOSIS — Z7984 Long term (current) use of oral hypoglycemic drugs: Secondary | ICD-10-CM | POA: Diagnosis not present

## 2016-04-10 DIAGNOSIS — Z86718 Personal history of other venous thrombosis and embolism: Secondary | ICD-10-CM | POA: Diagnosis not present

## 2016-04-10 DIAGNOSIS — Z9714 Presence of artificial left leg (complete) (partial): Secondary | ICD-10-CM | POA: Diagnosis not present

## 2016-04-10 DIAGNOSIS — Z4781 Encounter for orthopedic aftercare following surgical amputation: Secondary | ICD-10-CM | POA: Diagnosis not present

## 2016-04-10 NOTE — Telephone Encounter (Signed)
It was already called to [facility], I called the pharmacy to double check.

## 2016-04-10 NOTE — Telephone Encounter (Signed)
Thanks

## 2016-04-15 DIAGNOSIS — C9000 Multiple myeloma not having achieved remission: Secondary | ICD-10-CM | POA: Diagnosis not present

## 2016-04-15 DIAGNOSIS — E1151 Type 2 diabetes mellitus with diabetic peripheral angiopathy without gangrene: Secondary | ICD-10-CM | POA: Diagnosis not present

## 2016-04-15 DIAGNOSIS — Z7901 Long term (current) use of anticoagulants: Secondary | ICD-10-CM | POA: Diagnosis not present

## 2016-04-15 DIAGNOSIS — M1991 Primary osteoarthritis, unspecified site: Secondary | ICD-10-CM | POA: Diagnosis not present

## 2016-04-15 DIAGNOSIS — Z4789 Encounter for other orthopedic aftercare: Secondary | ICD-10-CM | POA: Diagnosis not present

## 2016-04-15 DIAGNOSIS — Z79891 Long term (current) use of opiate analgesic: Secondary | ICD-10-CM | POA: Diagnosis not present

## 2016-04-15 DIAGNOSIS — Z89512 Acquired absence of left leg below knee: Secondary | ICD-10-CM | POA: Diagnosis not present

## 2016-04-15 DIAGNOSIS — Z86718 Personal history of other venous thrombosis and embolism: Secondary | ICD-10-CM | POA: Diagnosis not present

## 2016-04-15 DIAGNOSIS — I10 Essential (primary) hypertension: Secondary | ICD-10-CM | POA: Diagnosis not present

## 2016-04-15 DIAGNOSIS — Z4781 Encounter for orthopedic aftercare following surgical amputation: Secondary | ICD-10-CM | POA: Diagnosis not present

## 2016-04-15 DIAGNOSIS — Z9714 Presence of artificial left leg (complete) (partial): Secondary | ICD-10-CM | POA: Diagnosis not present

## 2016-04-15 DIAGNOSIS — Z7984 Long term (current) use of oral hypoglycemic drugs: Secondary | ICD-10-CM | POA: Diagnosis not present

## 2016-04-15 DIAGNOSIS — R627 Adult failure to thrive: Secondary | ICD-10-CM | POA: Diagnosis not present

## 2016-04-16 DIAGNOSIS — Z79891 Long term (current) use of opiate analgesic: Secondary | ICD-10-CM | POA: Diagnosis not present

## 2016-04-16 DIAGNOSIS — Z7984 Long term (current) use of oral hypoglycemic drugs: Secondary | ICD-10-CM | POA: Diagnosis not present

## 2016-04-16 DIAGNOSIS — C9000 Multiple myeloma not having achieved remission: Secondary | ICD-10-CM | POA: Diagnosis not present

## 2016-04-16 DIAGNOSIS — Z86718 Personal history of other venous thrombosis and embolism: Secondary | ICD-10-CM | POA: Diagnosis not present

## 2016-04-16 DIAGNOSIS — M1991 Primary osteoarthritis, unspecified site: Secondary | ICD-10-CM | POA: Diagnosis not present

## 2016-04-16 DIAGNOSIS — Z89512 Acquired absence of left leg below knee: Secondary | ICD-10-CM | POA: Diagnosis not present

## 2016-04-16 DIAGNOSIS — I10 Essential (primary) hypertension: Secondary | ICD-10-CM | POA: Diagnosis not present

## 2016-04-16 DIAGNOSIS — Z7901 Long term (current) use of anticoagulants: Secondary | ICD-10-CM | POA: Diagnosis not present

## 2016-04-16 DIAGNOSIS — E1151 Type 2 diabetes mellitus with diabetic peripheral angiopathy without gangrene: Secondary | ICD-10-CM | POA: Diagnosis not present

## 2016-04-16 DIAGNOSIS — Z4781 Encounter for orthopedic aftercare following surgical amputation: Secondary | ICD-10-CM | POA: Diagnosis not present

## 2016-04-16 DIAGNOSIS — Z9714 Presence of artificial left leg (complete) (partial): Secondary | ICD-10-CM | POA: Diagnosis not present

## 2016-04-22 DIAGNOSIS — M1991 Primary osteoarthritis, unspecified site: Secondary | ICD-10-CM | POA: Diagnosis not present

## 2016-04-22 DIAGNOSIS — Z4781 Encounter for orthopedic aftercare following surgical amputation: Secondary | ICD-10-CM | POA: Diagnosis not present

## 2016-04-22 DIAGNOSIS — Z7984 Long term (current) use of oral hypoglycemic drugs: Secondary | ICD-10-CM | POA: Diagnosis not present

## 2016-04-22 DIAGNOSIS — E1151 Type 2 diabetes mellitus with diabetic peripheral angiopathy without gangrene: Secondary | ICD-10-CM | POA: Diagnosis not present

## 2016-04-22 DIAGNOSIS — I10 Essential (primary) hypertension: Secondary | ICD-10-CM | POA: Diagnosis not present

## 2016-04-22 DIAGNOSIS — C9000 Multiple myeloma not having achieved remission: Secondary | ICD-10-CM | POA: Diagnosis not present

## 2016-04-22 DIAGNOSIS — Z79891 Long term (current) use of opiate analgesic: Secondary | ICD-10-CM | POA: Diagnosis not present

## 2016-04-22 DIAGNOSIS — Z9714 Presence of artificial left leg (complete) (partial): Secondary | ICD-10-CM | POA: Diagnosis not present

## 2016-04-22 DIAGNOSIS — Z89512 Acquired absence of left leg below knee: Secondary | ICD-10-CM | POA: Diagnosis not present

## 2016-04-22 DIAGNOSIS — Z7901 Long term (current) use of anticoagulants: Secondary | ICD-10-CM | POA: Diagnosis not present

## 2016-04-22 DIAGNOSIS — Z86718 Personal history of other venous thrombosis and embolism: Secondary | ICD-10-CM | POA: Diagnosis not present

## 2016-04-25 ENCOUNTER — Other Ambulatory Visit: Payer: Self-pay | Admitting: Family Medicine

## 2016-04-25 ENCOUNTER — Encounter: Payer: Medicare Other | Attending: Physical Medicine & Rehabilitation | Admitting: Physical Medicine & Rehabilitation

## 2016-04-25 ENCOUNTER — Other Ambulatory Visit: Payer: Self-pay | Admitting: Vascular Surgery

## 2016-04-25 DIAGNOSIS — I10 Essential (primary) hypertension: Secondary | ICD-10-CM | POA: Diagnosis not present

## 2016-04-25 DIAGNOSIS — Z1231 Encounter for screening mammogram for malignant neoplasm of breast: Secondary | ICD-10-CM

## 2016-04-25 DIAGNOSIS — Z9714 Presence of artificial left leg (complete) (partial): Secondary | ICD-10-CM | POA: Diagnosis not present

## 2016-04-25 DIAGNOSIS — Z4781 Encounter for orthopedic aftercare following surgical amputation: Secondary | ICD-10-CM | POA: Diagnosis not present

## 2016-04-25 DIAGNOSIS — M1991 Primary osteoarthritis, unspecified site: Secondary | ICD-10-CM | POA: Diagnosis not present

## 2016-04-25 DIAGNOSIS — E1151 Type 2 diabetes mellitus with diabetic peripheral angiopathy without gangrene: Secondary | ICD-10-CM | POA: Diagnosis not present

## 2016-04-25 DIAGNOSIS — Z7901 Long term (current) use of anticoagulants: Secondary | ICD-10-CM | POA: Diagnosis not present

## 2016-04-25 DIAGNOSIS — Z79891 Long term (current) use of opiate analgesic: Secondary | ICD-10-CM | POA: Diagnosis not present

## 2016-04-25 DIAGNOSIS — Z86718 Personal history of other venous thrombosis and embolism: Secondary | ICD-10-CM | POA: Diagnosis not present

## 2016-04-25 DIAGNOSIS — Z7984 Long term (current) use of oral hypoglycemic drugs: Secondary | ICD-10-CM | POA: Diagnosis not present

## 2016-04-25 DIAGNOSIS — C9000 Multiple myeloma not having achieved remission: Secondary | ICD-10-CM | POA: Diagnosis not present

## 2016-04-25 DIAGNOSIS — Z89512 Acquired absence of left leg below knee: Secondary | ICD-10-CM | POA: Diagnosis not present

## 2016-04-29 DIAGNOSIS — E1151 Type 2 diabetes mellitus with diabetic peripheral angiopathy without gangrene: Secondary | ICD-10-CM | POA: Diagnosis not present

## 2016-04-29 DIAGNOSIS — Z79891 Long term (current) use of opiate analgesic: Secondary | ICD-10-CM | POA: Diagnosis not present

## 2016-04-29 DIAGNOSIS — Z89512 Acquired absence of left leg below knee: Secondary | ICD-10-CM | POA: Diagnosis not present

## 2016-04-29 DIAGNOSIS — C9000 Multiple myeloma not having achieved remission: Secondary | ICD-10-CM | POA: Diagnosis not present

## 2016-04-29 DIAGNOSIS — Z7984 Long term (current) use of oral hypoglycemic drugs: Secondary | ICD-10-CM | POA: Diagnosis not present

## 2016-04-29 DIAGNOSIS — Z86718 Personal history of other venous thrombosis and embolism: Secondary | ICD-10-CM | POA: Diagnosis not present

## 2016-04-29 DIAGNOSIS — I129 Hypertensive chronic kidney disease with stage 1 through stage 4 chronic kidney disease, or unspecified chronic kidney disease: Secondary | ICD-10-CM | POA: Diagnosis not present

## 2016-04-29 DIAGNOSIS — Z4781 Encounter for orthopedic aftercare following surgical amputation: Secondary | ICD-10-CM | POA: Diagnosis not present

## 2016-04-29 DIAGNOSIS — Z9714 Presence of artificial left leg (complete) (partial): Secondary | ICD-10-CM | POA: Diagnosis not present

## 2016-04-29 DIAGNOSIS — M1991 Primary osteoarthritis, unspecified site: Secondary | ICD-10-CM | POA: Diagnosis not present

## 2016-04-29 DIAGNOSIS — Z7901 Long term (current) use of anticoagulants: Secondary | ICD-10-CM | POA: Diagnosis not present

## 2016-04-29 DIAGNOSIS — I10 Essential (primary) hypertension: Secondary | ICD-10-CM | POA: Diagnosis not present

## 2016-05-01 DIAGNOSIS — E1151 Type 2 diabetes mellitus with diabetic peripheral angiopathy without gangrene: Secondary | ICD-10-CM | POA: Diagnosis not present

## 2016-05-01 DIAGNOSIS — Z4781 Encounter for orthopedic aftercare following surgical amputation: Secondary | ICD-10-CM | POA: Diagnosis not present

## 2016-05-01 DIAGNOSIS — C9000 Multiple myeloma not having achieved remission: Secondary | ICD-10-CM | POA: Diagnosis not present

## 2016-05-01 DIAGNOSIS — Z7984 Long term (current) use of oral hypoglycemic drugs: Secondary | ICD-10-CM | POA: Diagnosis not present

## 2016-05-01 DIAGNOSIS — Z9714 Presence of artificial left leg (complete) (partial): Secondary | ICD-10-CM | POA: Diagnosis not present

## 2016-05-01 DIAGNOSIS — Z7901 Long term (current) use of anticoagulants: Secondary | ICD-10-CM | POA: Diagnosis not present

## 2016-05-01 DIAGNOSIS — Z86718 Personal history of other venous thrombosis and embolism: Secondary | ICD-10-CM | POA: Diagnosis not present

## 2016-05-01 DIAGNOSIS — Z79891 Long term (current) use of opiate analgesic: Secondary | ICD-10-CM | POA: Diagnosis not present

## 2016-05-01 DIAGNOSIS — M1991 Primary osteoarthritis, unspecified site: Secondary | ICD-10-CM | POA: Diagnosis not present

## 2016-05-01 DIAGNOSIS — Z89512 Acquired absence of left leg below knee: Secondary | ICD-10-CM | POA: Diagnosis not present

## 2016-05-01 DIAGNOSIS — I10 Essential (primary) hypertension: Secondary | ICD-10-CM | POA: Diagnosis not present

## 2016-05-07 DIAGNOSIS — Z7901 Long term (current) use of anticoagulants: Secondary | ICD-10-CM | POA: Diagnosis not present

## 2016-05-07 DIAGNOSIS — I10 Essential (primary) hypertension: Secondary | ICD-10-CM | POA: Diagnosis not present

## 2016-05-07 DIAGNOSIS — Z7984 Long term (current) use of oral hypoglycemic drugs: Secondary | ICD-10-CM | POA: Diagnosis not present

## 2016-05-07 DIAGNOSIS — Z79891 Long term (current) use of opiate analgesic: Secondary | ICD-10-CM | POA: Diagnosis not present

## 2016-05-07 DIAGNOSIS — M1991 Primary osteoarthritis, unspecified site: Secondary | ICD-10-CM | POA: Diagnosis not present

## 2016-05-07 DIAGNOSIS — Z4781 Encounter for orthopedic aftercare following surgical amputation: Secondary | ICD-10-CM | POA: Diagnosis not present

## 2016-05-07 DIAGNOSIS — C9000 Multiple myeloma not having achieved remission: Secondary | ICD-10-CM | POA: Diagnosis not present

## 2016-05-07 DIAGNOSIS — Z89512 Acquired absence of left leg below knee: Secondary | ICD-10-CM | POA: Diagnosis not present

## 2016-05-07 DIAGNOSIS — Z86718 Personal history of other venous thrombosis and embolism: Secondary | ICD-10-CM | POA: Diagnosis not present

## 2016-05-07 DIAGNOSIS — E1151 Type 2 diabetes mellitus with diabetic peripheral angiopathy without gangrene: Secondary | ICD-10-CM | POA: Diagnosis not present

## 2016-05-07 DIAGNOSIS — Z9714 Presence of artificial left leg (complete) (partial): Secondary | ICD-10-CM | POA: Diagnosis not present

## 2016-05-09 DIAGNOSIS — Z79891 Long term (current) use of opiate analgesic: Secondary | ICD-10-CM | POA: Diagnosis not present

## 2016-05-09 DIAGNOSIS — Z7984 Long term (current) use of oral hypoglycemic drugs: Secondary | ICD-10-CM | POA: Diagnosis not present

## 2016-05-09 DIAGNOSIS — Z89512 Acquired absence of left leg below knee: Secondary | ICD-10-CM | POA: Diagnosis not present

## 2016-05-09 DIAGNOSIS — Z7901 Long term (current) use of anticoagulants: Secondary | ICD-10-CM | POA: Diagnosis not present

## 2016-05-09 DIAGNOSIS — E1151 Type 2 diabetes mellitus with diabetic peripheral angiopathy without gangrene: Secondary | ICD-10-CM | POA: Diagnosis not present

## 2016-05-09 DIAGNOSIS — M1991 Primary osteoarthritis, unspecified site: Secondary | ICD-10-CM | POA: Diagnosis not present

## 2016-05-09 DIAGNOSIS — Z9714 Presence of artificial left leg (complete) (partial): Secondary | ICD-10-CM | POA: Diagnosis not present

## 2016-05-09 DIAGNOSIS — Z86718 Personal history of other venous thrombosis and embolism: Secondary | ICD-10-CM | POA: Diagnosis not present

## 2016-05-09 DIAGNOSIS — I10 Essential (primary) hypertension: Secondary | ICD-10-CM | POA: Diagnosis not present

## 2016-05-09 DIAGNOSIS — Z4781 Encounter for orthopedic aftercare following surgical amputation: Secondary | ICD-10-CM | POA: Diagnosis not present

## 2016-05-09 DIAGNOSIS — C9000 Multiple myeloma not having achieved remission: Secondary | ICD-10-CM | POA: Diagnosis not present

## 2016-05-12 ENCOUNTER — Ambulatory Visit
Admission: RE | Admit: 2016-05-12 | Discharge: 2016-05-12 | Disposition: A | Discharge status: Home / Self Care | Payer: Medicare Other | Source: Ambulatory Visit | Attending: Family Medicine | Admitting: Family Medicine

## 2016-05-12 DIAGNOSIS — Z1231 Encounter for screening mammogram for malignant neoplasm of breast: Secondary | ICD-10-CM

## 2016-05-13 DIAGNOSIS — E1151 Type 2 diabetes mellitus with diabetic peripheral angiopathy without gangrene: Secondary | ICD-10-CM | POA: Diagnosis not present

## 2016-05-13 DIAGNOSIS — I10 Essential (primary) hypertension: Secondary | ICD-10-CM | POA: Diagnosis not present

## 2016-05-13 DIAGNOSIS — Z7901 Long term (current) use of anticoagulants: Secondary | ICD-10-CM | POA: Diagnosis not present

## 2016-05-13 DIAGNOSIS — Z79891 Long term (current) use of opiate analgesic: Secondary | ICD-10-CM | POA: Diagnosis not present

## 2016-05-13 DIAGNOSIS — Z7984 Long term (current) use of oral hypoglycemic drugs: Secondary | ICD-10-CM | POA: Diagnosis not present

## 2016-05-13 DIAGNOSIS — Z86718 Personal history of other venous thrombosis and embolism: Secondary | ICD-10-CM | POA: Diagnosis not present

## 2016-05-13 DIAGNOSIS — Z89512 Acquired absence of left leg below knee: Secondary | ICD-10-CM | POA: Diagnosis not present

## 2016-05-13 DIAGNOSIS — Z4781 Encounter for orthopedic aftercare following surgical amputation: Secondary | ICD-10-CM | POA: Diagnosis not present

## 2016-05-13 DIAGNOSIS — Z9714 Presence of artificial left leg (complete) (partial): Secondary | ICD-10-CM | POA: Diagnosis not present

## 2016-05-13 DIAGNOSIS — M1991 Primary osteoarthritis, unspecified site: Secondary | ICD-10-CM | POA: Diagnosis not present

## 2016-05-13 DIAGNOSIS — C9000 Multiple myeloma not having achieved remission: Secondary | ICD-10-CM | POA: Diagnosis not present

## 2016-05-15 ENCOUNTER — Encounter: Payer: Self-pay | Admitting: *Deleted

## 2016-05-15 DIAGNOSIS — Z4781 Encounter for orthopedic aftercare following surgical amputation: Secondary | ICD-10-CM | POA: Diagnosis not present

## 2016-05-15 DIAGNOSIS — C9000 Multiple myeloma not having achieved remission: Secondary | ICD-10-CM | POA: Diagnosis not present

## 2016-05-15 DIAGNOSIS — Z9714 Presence of artificial left leg (complete) (partial): Secondary | ICD-10-CM | POA: Diagnosis not present

## 2016-05-15 DIAGNOSIS — Z7901 Long term (current) use of anticoagulants: Secondary | ICD-10-CM | POA: Diagnosis not present

## 2016-05-15 DIAGNOSIS — I10 Essential (primary) hypertension: Secondary | ICD-10-CM | POA: Diagnosis not present

## 2016-05-15 DIAGNOSIS — Z86718 Personal history of other venous thrombosis and embolism: Secondary | ICD-10-CM | POA: Diagnosis not present

## 2016-05-15 DIAGNOSIS — Z89512 Acquired absence of left leg below knee: Secondary | ICD-10-CM | POA: Diagnosis not present

## 2016-05-15 DIAGNOSIS — M1991 Primary osteoarthritis, unspecified site: Secondary | ICD-10-CM | POA: Diagnosis not present

## 2016-05-15 DIAGNOSIS — Z7984 Long term (current) use of oral hypoglycemic drugs: Secondary | ICD-10-CM | POA: Diagnosis not present

## 2016-05-15 DIAGNOSIS — Z79891 Long term (current) use of opiate analgesic: Secondary | ICD-10-CM | POA: Diagnosis not present

## 2016-05-15 DIAGNOSIS — E1151 Type 2 diabetes mellitus with diabetic peripheral angiopathy without gangrene: Secondary | ICD-10-CM | POA: Diagnosis not present

## 2016-05-16 DIAGNOSIS — Z4789 Encounter for other orthopedic aftercare: Secondary | ICD-10-CM | POA: Diagnosis not present

## 2016-05-16 DIAGNOSIS — R627 Adult failure to thrive: Secondary | ICD-10-CM | POA: Diagnosis not present

## 2016-05-19 ENCOUNTER — Other Ambulatory Visit: Payer: Self-pay | Admitting: Family Medicine

## 2016-05-19 NOTE — Telephone Encounter (Signed)
Electronic refill request. Last Filled:    100 tablet 2 03/11/2016  Last office visit:   12/24/15   Please advise.

## 2016-05-20 ENCOUNTER — Other Ambulatory Visit: Payer: Self-pay | Admitting: Family Medicine

## 2016-05-20 DIAGNOSIS — Z79891 Long term (current) use of opiate analgesic: Secondary | ICD-10-CM | POA: Diagnosis not present

## 2016-05-20 DIAGNOSIS — I10 Essential (primary) hypertension: Secondary | ICD-10-CM | POA: Diagnosis not present

## 2016-05-20 DIAGNOSIS — M1991 Primary osteoarthritis, unspecified site: Secondary | ICD-10-CM | POA: Diagnosis not present

## 2016-05-20 DIAGNOSIS — C9000 Multiple myeloma not having achieved remission: Secondary | ICD-10-CM | POA: Diagnosis not present

## 2016-05-20 DIAGNOSIS — Z86718 Personal history of other venous thrombosis and embolism: Secondary | ICD-10-CM | POA: Diagnosis not present

## 2016-05-20 DIAGNOSIS — Z9714 Presence of artificial left leg (complete) (partial): Secondary | ICD-10-CM | POA: Diagnosis not present

## 2016-05-20 DIAGNOSIS — Z7901 Long term (current) use of anticoagulants: Secondary | ICD-10-CM | POA: Diagnosis not present

## 2016-05-20 DIAGNOSIS — Z7984 Long term (current) use of oral hypoglycemic drugs: Secondary | ICD-10-CM | POA: Diagnosis not present

## 2016-05-20 DIAGNOSIS — E1151 Type 2 diabetes mellitus with diabetic peripheral angiopathy without gangrene: Secondary | ICD-10-CM | POA: Diagnosis not present

## 2016-05-20 DIAGNOSIS — Z4781 Encounter for orthopedic aftercare following surgical amputation: Secondary | ICD-10-CM | POA: Diagnosis not present

## 2016-05-20 DIAGNOSIS — Z89512 Acquired absence of left leg below knee: Secondary | ICD-10-CM | POA: Diagnosis not present

## 2016-05-20 NOTE — Telephone Encounter (Signed)
Rx called to pharmacy as instructed. Left message on voicemail for patient to call back. Needs to schedule appointment this fall.

## 2016-05-20 NOTE — Telephone Encounter (Signed)
Please call in.  Reasonable for OV this fall when possible.  Thanks.

## 2016-05-22 NOTE — Telephone Encounter (Signed)
Left detailed message on voicemail of daughter [name]).

## 2016-05-23 DIAGNOSIS — Z7901 Long term (current) use of anticoagulants: Secondary | ICD-10-CM | POA: Diagnosis not present

## 2016-05-23 DIAGNOSIS — M1991 Primary osteoarthritis, unspecified site: Secondary | ICD-10-CM | POA: Diagnosis not present

## 2016-05-23 DIAGNOSIS — Z9714 Presence of artificial left leg (complete) (partial): Secondary | ICD-10-CM | POA: Diagnosis not present

## 2016-05-23 DIAGNOSIS — E1151 Type 2 diabetes mellitus with diabetic peripheral angiopathy without gangrene: Secondary | ICD-10-CM | POA: Diagnosis not present

## 2016-05-23 DIAGNOSIS — Z86718 Personal history of other venous thrombosis and embolism: Secondary | ICD-10-CM | POA: Diagnosis not present

## 2016-05-23 DIAGNOSIS — Z89512 Acquired absence of left leg below knee: Secondary | ICD-10-CM | POA: Diagnosis not present

## 2016-05-23 DIAGNOSIS — Z79891 Long term (current) use of opiate analgesic: Secondary | ICD-10-CM | POA: Diagnosis not present

## 2016-05-23 DIAGNOSIS — I10 Essential (primary) hypertension: Secondary | ICD-10-CM | POA: Diagnosis not present

## 2016-05-23 DIAGNOSIS — Z7984 Long term (current) use of oral hypoglycemic drugs: Secondary | ICD-10-CM | POA: Diagnosis not present

## 2016-05-23 DIAGNOSIS — Z4781 Encounter for orthopedic aftercare following surgical amputation: Secondary | ICD-10-CM | POA: Diagnosis not present

## 2016-05-23 DIAGNOSIS — C9000 Multiple myeloma not having achieved remission: Secondary | ICD-10-CM | POA: Diagnosis not present

## 2016-06-04 ENCOUNTER — Ambulatory Visit: Payer: Medicare Other | Admitting: Family Medicine

## 2016-06-05 ENCOUNTER — Telehealth: Payer: Self-pay | Admitting: Family Medicine

## 2016-06-05 NOTE — Telephone Encounter (Signed)
I'm sorry it was such a hassle for them.  Thanks for the notice.  Please make sure she didn't get a no show fee.  Glad to see here whenever possible.  Thanks.

## 2016-06-05 NOTE — Telephone Encounter (Signed)
Patient's daughter called to let Dr.Duncan know patient missed her appointment yesterday because they were stuck in traffic on the highway.  They didn't get off the highway until 5:30 and she knew that was too late for the 5:15 appointment.  She tried to call,but the phones don't ring after 5:00.  She rescheduled patient's appointment to 06/11/16 at 6:00.

## 2016-06-11 ENCOUNTER — Ambulatory Visit (INDEPENDENT_AMBULATORY_CARE_PROVIDER_SITE_OTHER): Payer: Medicare Other | Admitting: Family Medicine

## 2016-06-11 ENCOUNTER — Encounter: Payer: Self-pay | Admitting: Family Medicine

## 2016-06-11 VITALS — BP 102/70 | HR 78

## 2016-06-11 DIAGNOSIS — C9000 Multiple myeloma not having achieved remission: Secondary | ICD-10-CM

## 2016-06-11 DIAGNOSIS — Z23 Encounter for immunization: Secondary | ICD-10-CM | POA: Diagnosis not present

## 2016-06-11 DIAGNOSIS — E119 Type 2 diabetes mellitus without complications: Secondary | ICD-10-CM

## 2016-06-11 DIAGNOSIS — M25519 Pain in unspecified shoulder: Secondary | ICD-10-CM | POA: Diagnosis not present

## 2016-06-11 DIAGNOSIS — Z7901 Long term (current) use of anticoagulants: Secondary | ICD-10-CM

## 2016-06-11 MED ORDER — POTASSIUM CHLORIDE CRYS ER 20 MEQ PO TBCR: 20.0000 meq | EXTENDED_RELEASE_TABLET | Freq: Every day | ORAL | 5 refills | Status: DC

## 2016-06-11 MED ORDER — AMLODIPINE BESYLATE 5 MG PO TABS: 5.0000 mg | ORAL_TABLET | Freq: Every day | ORAL | 5 refills | Status: DC

## 2016-06-11 MED ORDER — GLIPIZIDE ER 2.5 MG PO TB24: 2.5000 mg | ORAL_TABLET | Freq: Every day | ORAL | 5 refills | Status: DC

## 2016-06-11 MED ORDER — WARFARIN SODIUM 5 MG PO TABS: 5.0000 mg | ORAL_TABLET | Freq: Every day | ORAL | 5 refills | Status: DC

## 2016-06-11 MED ORDER — LISINOPRIL 5 MG PO TABS: 5.0000 mg | ORAL_TABLET | Freq: Every day | ORAL | 5 refills | Status: DC

## 2016-06-11 MED ORDER — HYDROCHLOROTHIAZIDE 12.5 MG PO CAPS: 12.5000 mg | ORAL_CAPSULE | Freq: Every day | ORAL | 5 refills | Status: DC

## 2016-06-11 MED ORDER — DICLOFENAC SODIUM 1 % TD GEL: 2.0000 g | Freq: Two times a day (BID) | TRANSDERMAL | 1 refills | Status: AC | PRN

## 2016-06-11 MED ORDER — ALPRAZOLAM 0.5 MG PO TABS: 0.5000 mg | ORAL_TABLET | Freq: Two times a day (BID) | ORAL | 2 refills | Status: DC | PRN

## 2016-06-11 MED ORDER — TRAMADOL HCL 50 MG PO TABS: ORAL_TABLET | ORAL | 2 refills | Status: DC

## 2016-06-11 MED ORDER — PANTOPRAZOLE SODIUM 40 MG PO TBEC: 40.0000 mg | DELAYED_RELEASE_TABLET | Freq: Every day | ORAL | 5 refills | Status: DC

## 2016-06-11 NOTE — Patient Instructions (Addendum)
Call about an eye exam when possible.   Go to the lab on the way out.  We'll contact you with your lab report. Take care.  Glad to see you.

## 2016-06-11 NOTE — Progress Notes (Signed)
Her daughter is in therapy after her illness.    DM2.  Still in therapy for L leg amputation.  She is working to improve mobility with her prosthesis.  No skin breakdown on the L BKA stump.  Due for DM2 labs.  No sx of a low sugar.  Complaint with med. Flu shot done today.  Last eye exam >1 year, due.  D/w pt.   H/o mult myeloma.  Hasn't seen onc recently.   On '5mg'$  coumadin a day, at baseline, due for INR per patient report.  Not on steroids currently.    R shoulder pain.  Pain in the AM especially, if laying on R side.  Pain with ROM.  Longstanding.  No trauma, no acute injury.   PMH and SH reviewed  ROS: Per HPI unless specifically indicated in ROS section   Meds, vitals, and allergies reviewed.   GEN: nad, alert and oriented HEENT: mucous membranes moist NECK: supple w/o LA CV: rrr. PULM: ctab, no inc wob ABD: soft, +bs EXT: no edema SKIN: no acute rash R shoulder with pain on ROM esp ext rotation but no arm drop.   Diabetic foot exam:  R foot: Normal inspection No skin breakdown No calluses  Normal DP pulses Normal sensation to light touch and monofilament Nails normal  L foot absent.

## 2016-06-12 DIAGNOSIS — M25519 Pain in unspecified shoulder: Secondary | ICD-10-CM | POA: Diagnosis not present

## 2016-06-12 DIAGNOSIS — Z23 Encounter for immunization: Secondary | ICD-10-CM | POA: Diagnosis not present

## 2016-06-12 DIAGNOSIS — E119 Type 2 diabetes mellitus without complications: Secondary | ICD-10-CM | POA: Diagnosis not present

## 2016-06-12 LAB — CBC WITH DIFFERENTIAL/PLATELET
BASOS ABS: 0 10*3/uL (ref 0.0–0.1)
Basophils Relative: 0.2 % (ref 0.0–3.0)
EOS ABS: 0.1 10*3/uL (ref 0.0–0.7)
Eosinophils Relative: 2.1 % (ref 0.0–5.0)
HCT: 27.3 % — ABNORMAL LOW (ref 36.0–46.0)
Hemoglobin: 8.9 g/dL — ABNORMAL LOW (ref 12.0–15.0)
LYMPHS ABS: 1.8 10*3/uL (ref 0.7–4.0)
Lymphocytes Relative: 32.1 % (ref 12.0–46.0)
MCHC: 32.8 g/dL (ref 30.0–36.0)
MCV: 81.7 fl (ref 78.0–100.0)
MONOS PCT: 11.1 % (ref 3.0–12.0)
Monocytes Absolute: 0.6 10*3/uL (ref 0.1–1.0)
NEUTROS ABS: 3 10*3/uL (ref 1.4–7.7)
NEUTROS PCT: 54.5 % (ref 43.0–77.0)
PLATELETS: 230 10*3/uL (ref 150.0–400.0)
RBC: 3.34 Mil/uL — ABNORMAL LOW (ref 3.87–5.11)
RDW: 16.6 % — ABNORMAL HIGH (ref 11.5–15.5)
WBC: 5.6 10*3/uL (ref 4.0–10.5)

## 2016-06-12 LAB — COMPREHENSIVE METABOLIC PANEL
ALT: 17 U/L (ref 0–35)
AST: 30 U/L (ref 0–37)
Albumin: 3.5 g/dL (ref 3.5–5.2)
Alkaline Phosphatase: 61 U/L (ref 39–117)
BILIRUBIN TOTAL: 0.2 mg/dL (ref 0.2–1.2)
BUN: 19 mg/dL (ref 6–23)
CO2: 26 meq/L (ref 19–32)
CREATININE: 1.3 mg/dL — AB (ref 0.40–1.20)
Calcium: 9.8 mg/dL (ref 8.4–10.5)
Chloride: 107 mEq/L (ref 96–112)
GFR: 50.96 mL/min — ABNORMAL LOW (ref >60.00)
GLUCOSE: 83 mg/dL (ref 70–99)
Potassium: 4.5 mEq/L (ref 3.5–5.1)
SODIUM: 140 meq/L (ref 135–145)
Total Protein: 6.1 g/dL (ref 6.0–8.3)

## 2016-06-12 LAB — LIPID PANEL
Cholesterol: 191 mg/dL (ref 0–200)
HDL: 53.3 mg/dL (ref >39.00)
LDL Cholesterol: 105 mg/dL — ABNORMAL HIGH (ref 0–99)
NONHDL: 137.29
Total CHOL/HDL Ratio: 4
Triglycerides: 162 mg/dL — ABNORMAL HIGH (ref 0.0–149.0)
VLDL: 32.4 mg/dL (ref 0.0–40.0)

## 2016-06-12 LAB — HEMOGLOBIN A1C: HEMOGLOBIN A1C: 6.2 % (ref 4.6–6.5)

## 2016-06-12 NOTE — Assessment & Plan Note (Signed)
I would continue tramadol for now.  D/w pt.  She agrees.

## 2016-06-12 NOTE — Assessment & Plan Note (Signed)
Continue current meds as is.  No change in meds.  See notes on labs. >25 minutes spent in face to face time with patient, >50% spent in counselling or coordination of care.

## 2016-06-12 NOTE — Assessment & Plan Note (Signed)
See notes on labs.  She'll f/u with onc when possible.

## 2016-06-12 NOTE — Assessment & Plan Note (Signed)
INR pending, see notes on labs.

## 2016-06-13 ENCOUNTER — Other Ambulatory Visit: Payer: Medicare Other

## 2016-06-13 ENCOUNTER — Other Ambulatory Visit: Payer: Self-pay | Admitting: Family Medicine

## 2016-06-13 NOTE — Addendum Note (Signed)
Addended by: Daralene Milch C on: 06/13/2016 11:50 AM   Modules accepted: Orders

## 2016-06-15 DIAGNOSIS — Z4789 Encounter for other orthopedic aftercare: Secondary | ICD-10-CM | POA: Diagnosis not present

## 2016-06-15 DIAGNOSIS — R627 Adult failure to thrive: Secondary | ICD-10-CM | POA: Diagnosis not present

## 2016-06-29 ENCOUNTER — Emergency Department (HOSPITAL_COMMUNITY): Payer: Medicare Other

## 2016-06-29 ENCOUNTER — Emergency Department (HOSPITAL_COMMUNITY)
Admission: EM | Admit: 2016-06-29 | Discharge: 2016-06-29 | Disposition: A | Discharge status: Home / Self Care | Payer: Medicare Other | Attending: Emergency Medicine | Admitting: Emergency Medicine

## 2016-06-29 DIAGNOSIS — M19071 Primary osteoarthritis, right ankle and foot: Secondary | ICD-10-CM | POA: Diagnosis not present

## 2016-06-29 DIAGNOSIS — I10 Essential (primary) hypertension: Secondary | ICD-10-CM | POA: Insufficient documentation

## 2016-06-29 DIAGNOSIS — E119 Type 2 diabetes mellitus without complications: Secondary | ICD-10-CM | POA: Diagnosis not present

## 2016-06-29 DIAGNOSIS — M25561 Pain in right knee: Secondary | ICD-10-CM | POA: Diagnosis present

## 2016-06-29 DIAGNOSIS — Z7984 Long term (current) use of oral hypoglycemic drugs: Secondary | ICD-10-CM | POA: Diagnosis not present

## 2016-06-29 DIAGNOSIS — Z79899 Other long term (current) drug therapy: Secondary | ICD-10-CM | POA: Diagnosis not present

## 2016-06-29 DIAGNOSIS — L03115 Cellulitis of right lower limb: Secondary | ICD-10-CM

## 2016-06-29 DIAGNOSIS — Z7901 Long term (current) use of anticoagulants: Secondary | ICD-10-CM | POA: Diagnosis not present

## 2016-06-29 DIAGNOSIS — Z87891 Personal history of nicotine dependence: Secondary | ICD-10-CM | POA: Insufficient documentation

## 2016-06-29 LAB — BASIC METABOLIC PANEL
ANION GAP: 8 (ref 5–15)
BUN: 20 mg/dL (ref 6–20)
CALCIUM: 10.7 mg/dL — AB (ref 8.9–10.3)
CHLORIDE: 104 mmol/L (ref 101–111)
CO2: 26 mmol/L (ref 22–32)
Creatinine, Ser: 0.93 mg/dL (ref 0.44–1.00)
GFR calc non Af Amer: 58 mL/min — ABNORMAL LOW (ref >60)
Glucose, Bld: 84 mg/dL (ref 65–99)
Potassium: 3.8 mmol/L (ref 3.5–5.1)
Sodium: 138 mmol/L (ref 135–145)

## 2016-06-29 LAB — CBC WITH DIFFERENTIAL/PLATELET
BASOS PCT: 0 %
Basophils Absolute: 0 10*3/uL (ref 0.0–0.1)
Eosinophils Absolute: 0.1 10*3/uL (ref 0.0–0.7)
Eosinophils Relative: 2 %
HEMATOCRIT: 28.6 % — AB (ref 36.0–46.0)
HEMOGLOBIN: 9 g/dL — AB (ref 12.0–15.0)
LYMPHS ABS: 1.9 10*3/uL (ref 0.7–4.0)
Lymphocytes Relative: 32 %
MCH: 25.9 pg — ABNORMAL LOW (ref 26.0–34.0)
MCHC: 31.5 g/dL (ref 30.0–36.0)
MCV: 82.4 fL (ref 78.0–100.0)
MONOS PCT: 10 %
Monocytes Absolute: 0.6 10*3/uL (ref 0.1–1.0)
NEUTROS ABS: 3.4 10*3/uL (ref 1.7–7.7)
NEUTROS PCT: 56 %
Platelets: 234 10*3/uL (ref 150–400)
RBC: 3.47 MIL/uL — ABNORMAL LOW (ref 3.87–5.11)
RDW: 16.1 % — ABNORMAL HIGH (ref 11.5–15.5)
WBC: 6 10*3/uL (ref 4.0–10.5)

## 2016-06-29 LAB — PROTIME-INR
INR: 2.48
Prothrombin Time: 27.3 seconds — ABNORMAL HIGH (ref 11.4–15.2)

## 2016-06-29 MED ORDER — CEPHALEXIN 500 MG PO CAPS
500.0000 mg | ORAL_CAPSULE | Freq: Once | ORAL | Status: AC
  Administered 2016-06-29: 500 mg via ORAL
  Filled 2016-06-29: qty 1

## 2016-06-29 MED ORDER — CEPHALEXIN 500 MG PO CAPS: 500.0000 mg | ORAL_CAPSULE | Freq: Four times a day (QID) | ORAL | 0 refills | Status: DC

## 2016-06-29 NOTE — ED Provider Notes (Signed)
[facility] DEPT Provider Note   CSN: [id number] Arrival date & time: 06/29/16  2035     History   Chief Complaint Chief Complaint  Patient presents with  . Toe Pain  . Knee Pain    HPI [name] is a 80 y.o. female.  The history is provided by the patient and a relative. No language interpreter was used.  Toe Pain   Knee Pain      [name] is a 80 y.o. female who presents to the Emergency Department complaining of toe pain.  3 days ago she developed pain in her right great toe accompanied by swelling to her right foot. Her daughter states that her leg has felt warm to the touch and the swelling has extended up to her knee. No reports of fevers, chest pain, vomiting. No known injuries to the leg. She does have a history of blood clots and takes Coumadin.  Past Medical History:  Diagnosis Date  . Anemia   . Anxiety    takes Xanax daily as needed  . Blood transfusion   . Cardiomyopathy ([place])   . Deep vein thrombosis of bilateral lower extremities (HCC)   . Degenerative joint disease   . Depression    Mild  . Diabetes mellitus    takes Glipizide daily  . Family history of anesthesia complication    DAUGHTER CPR AFTER  . GERD (gastroesophageal reflux disease)    takes Protonix daily  . Heart murmur   . History of chicken pox   . History of radiation therapy 08/30/13-09/20/13   20 gray to lower lumbar/upper sacrum  . History of shingles   . Hyperlipidemia   . Hypertension    takes Amlodipine,HCTZ,and Lisinopril daily  . Multiple myeloma    per Dr. Julien Nordmann, s/p palliative radiation for leg pain 2011 per Dr. Sondra Come  . Phlebitis   . UTI (urinary tract infection) 09/08/2013    Patient Active Problem List   Diagnosis Date Noted  . Pain in joint, shoulder region 06/12/2016  . Wound dehiscence 11/16/2015  . Anxiety state   . Chronic anticoagulation   . History of DVT (deep vein thrombosis)   . Acute blood loss anemia   . Post-operative pain   .  Status post below knee amputation of left lower extremity ([place]) 10/12/2015  . PAD (peripheral artery disease) ([place]) 10/09/2015  . Dry gangrene ([place]) 09/13/2015  . PVD (peripheral vascular disease) ([place]) 09/09/2015  . Encounter for antineoplastic chemotherapy 06/26/2015  . Chronic pain 02/07/2015  . Hypokalemia 11/28/2014  . Long term current use of anticoagulant therapy 11/28/2014  . Fissure in skin of foot 11/28/2014  . Sinus bradycardia 09/08/2014  . DNR no code (do not resuscitate) 09/08/2013  . Chronic steroid use 09/08/2013  . Deep vein thrombosis of left lower extremity ([place]) 05/12/2013  . Deep vein thrombosis of right lower extremity ([place]) 05/12/2013  . Left hip pain 05/16/2012  . Gait instability 05/08/2011  . Multiple myeloma ([place]) 03/29/2010  . Diabetes mellitus without complication ([place]) 84/16/6063  . DEPRESSION, MILD 03/29/2010  . LEG PAIN, RIGHT 03/29/2010  . Hyperlipidemia 05/19/2008  . Essential hypertension 05/19/2008  . Osteoarthritis 05/19/2008    Past Surgical History:  Procedure Laterality Date  . ABDOMINAL HYSTERECTOMY    . AMPUTATION Left 10/09/2015   Procedure: Left Leg AMPUTATION BELOW KNEE;  Surgeon: Angelia Mould, MD;  Location: [facility];  Service: Vascular;  Laterality: Left;  . ANKLE FRACTURE SURGERY Left   .  APPLICATION OF WOUND VAC Left 11/16/2015   Procedure: APPLICATION OF WOUND VAC LEFT BELOW THE KNEE AMPUTEE.;  Surgeon: Angelia Mould, MD;  Location: [facility];  Service: Vascular;  Laterality: Left;  . COLONOSCOPY    . EAR CYST EXCISION Left 04/16/2015   Procedure: EXCISION FACIAL CYST LEFT SIDE ;  Surgeon: Izora Gala, MD;  Location: [facility];  Service: ENT;  Laterality: Left;  . EYE SURGERY Bilateral    cataract surgery  . I&D EXTREMITY Left 11/16/2015   Procedure: DEBRIDEMENT OF LEFT  BELOW KNEE AMPUTATION ;  Surgeon: Angelia Mould, MD;  Location: [facility];  Service: Vascular;  Laterality: Left;  . PERIPHERAL  VASCULAR CATHETERIZATION  08/14/2015   Procedure: Lower Extremity Intervention;  Surgeon: Katha Cabal, MD;  Location: [facility];  Service: Cardiovascular;;  . PERIPHERAL VASCULAR CATHETERIZATION N/A 08/14/2015   Procedure: Abdominal Aortogram w/Lower Extremity;  Surgeon: Katha Cabal, MD;  Location: [facility];  Service: Cardiovascular;  Laterality: N/A;    OB History    No data available       Home Medications    Prior to Admission medications   Medication Sig Start Date End Date Taking? Authorizing Provider  ALPRAZolam Duanne Moron) 0.5 MG tablet Take 1 tablet (0.5 mg total) by mouth 2 (two) times daily as needed. for anxiety 06/11/16   Tonia Ghent, MD  amLODipine (NORVASC) 5 MG tablet Take 1 tablet (5 mg total) by mouth daily. 06/11/16   Tonia Ghent, MD  Calcium Carbonate-Vitamin D 600-400 MG-UNIT tablet Take 1 tablet by mouth daily. 10/19/15   Lavon Paganini Angiulli, PA-C  cephALEXin (KEFLEX) 500 MG capsule Take 1 capsule (500 mg total) by mouth 4 (four) times daily. 06/29/16   Quintella Reichert, MD  diclofenac sodium (VOLTAREN) 1 % GEL Apply 2 g topically 2 (two) times daily as needed (for shoulder pain). 06/11/16   Tonia Ghent, MD  glipiZIDE (GLUCOTROL XL) 2.5 MG 24 hr tablet Take 1 tablet (2.5 mg total) by mouth daily. 06/11/16   Tonia Ghent, MD  glucose blood (ONE TOUCH ULTRA TEST) test strip One touch ultra 2 test strips.  Use daily.  Dx e11.9. 02/05/16   Tonia Ghent, MD  hydrochlorothiazide (MICROZIDE) 12.5 MG capsule Take 1 capsule (12.5 mg total) by mouth daily. 06/11/16   Tonia Ghent, MD  lisinopril (PRINIVIL,ZESTRIL) 5 MG tablet Take 1 tablet (5 mg total) by mouth daily. 06/11/16   Tonia Ghent, MD  ONE TOUCH LANCETS MISC USE TO CHECK BLOOD SUGAR TWO TIMES DAILY.    DX:  E11.9 02/07/16   Tonia Ghent, MD  pantoprazole (PROTONIX) 40 MG tablet Take 1 tablet (40 mg total) by mouth daily. 06/11/16   Tonia Ghent, MD  potassium  chloride SA (K-DUR,KLOR-CON) 20 MEQ tablet Take 1 tablet (20 mEq total) by mouth daily. 06/11/16   Tonia Ghent, MD  traMADol (ULTRAM) 50 MG tablet TAKE ONE TABLET BY MOUTH EVERY 6 HOURS AS NEEDED FOR  MODERATE  PAIN 06/11/16   Tonia Ghent, MD  warfarin (COUMADIN) 5 MG tablet Take 1 tablet (5 mg total) by mouth daily. 06/11/16   Tonia Ghent, MD    Family History Family History  Problem Relation Age of Onset  . Diabetes Mother   . Hypertension Mother   . Arthritis Mother   . Stroke Mother   . Diabetes Father   . Hypertension Father   .  Arthritis Father   . Stroke Father   . Cancer Sister     stomach  . Stroke Brother   . Cancer Brother     prostate <50  . Stroke Daughter   . Stroke Son   . Arthritis Other     Parents  . Cancer Other     Colon, 1st degree relative <60  . Diabetes Other     Parents  . Stroke Other     1st degree relative <60    Social History Social History  Substance Use Topics  . Smoking status: Former Smoker    Packs/day: 1.00    Years: 13.00    Quit date: 09/01/1973  . Smokeless tobacco: Never Used     Comment: QIUT MANY YEARS AGO  . Alcohol use No     Allergies   Review of patient's allergies indicates no known allergies.   Review of Systems Review of Systems  All other systems reviewed and are negative.    Physical Exam Updated Vital Signs BP 127/74 (BP Location: Left Arm)   Pulse 81   Temp 98.9 F (37.2 C) (Oral)   Resp 18   Ht '5\' 2"'$  (1.575 m)   Wt 105 lb (47.6 kg)   SpO2 96%   BMI 19.20 kg/m   Physical Exam  Constitutional: She is oriented to person, place, and time. She appears well-developed and well-nourished.  HENT:  Head: Normocephalic and atraumatic.  Cardiovascular: Normal rate and regular rhythm.   No murmur heard. Pulmonary/Chest: Effort normal and breath sounds normal. No respiratory distress.  Abdominal: Soft. There is no tenderness. There is no rebound and no guarding.  Musculoskeletal:  Left BKA.  There is tenderness over the right great toe without any lesions present. There is mild diffuse soft tissue swelling of the right foot and right leg with trace pitting edema. 2+ DP pulses bilaterally. No significant knee tenderness to palpation.  Neurological: She is alert and oriented to person, place, and time.  Skin: Skin is warm and dry.  Psychiatric: She has a normal mood and affect. Her behavior is normal.  Nursing note and vitals reviewed.    ED Treatments / Results  Labs (all labs ordered are listed, but only abnormal results are displayed) Labs Reviewed  BASIC METABOLIC PANEL - Abnormal; Notable for the following:       Result Value   Calcium 10.7 (*)    GFR calc non Af Amer 58 (*)    All other components within normal limits  CBC WITH DIFFERENTIAL/PLATELET - Abnormal; Notable for the following:    RBC 3.47 (*)    Hemoglobin 9.0 (*)    HCT 28.6 (*)    MCH 25.9 (*)    RDW 16.1 (*)    All other components within normal limits  PROTIME-INR - Abnormal; Notable for the following:    Prothrombin Time 27.3 (*)    All other components within normal limits    EKG  EKG Interpretation None       Radiology Dg Foot Complete Right  Result Date: 06/29/2016 CLINICAL DATA:  Initial evaluation for acute right swelling and pain at right first toe. No injury. EXAM: RIGHT FOOT COMPLETE - 3+ VIEW COMPARISON:  None. FINDINGS: No acute fracture or dislocation. Scattered degenerative osteoarthritic changes present about the foot, with mild degenerative osteoarthrosis at the right first MTP joint. No acute soft tissue abnormality. Osseous mineralization within normal limits. Prominent vascular calcifications noted throughout the foot. Posterior plantar calcaneal enthesophytes  noted. IMPRESSION: 1. No acute fracture or dislocation. 2. Mild degenerative osteoarthrosis at the right first MTP joint. 3. Extensive atherosclerosis. Electronically Signed   By: Jeannine Boga M.D.   On:  06/29/2016 21:33    Procedures Procedures (including critical care time)  Medications Ordered in ED Medications  cephALEXin (KEFLEX) capsule 500 mg (500 mg Oral Given 06/29/16 2314)     Initial Impression / Assessment and Plan / ED Course  I have reviewed the triage vital signs and the nursing notes.  Pertinent labs & imaging results that were available during my care of the patient were reviewed by me and considered in my medical decision making (see chart for details).  Clinical Course    Patient with history of diabetes here for evaluation of right great toe pain and foot swelling. She is nontoxic appearing on examination. Her foot is well perfused with no evidence of abscess or septic joint. There are no lesions or ulcers on the foot. Question early developing cellulitis given the swelling of her foot and pain. Will treat with Keflex with close PCP follow-up.  Final Clinical Impressions(s) / ED Diagnoses   Final diagnoses:  Cellulitis of right lower extremity    New Prescriptions Discharge Medication List as of 06/29/2016 10:37 PM    START taking these medications   Details  cephALEXin (KEFLEX) 500 MG capsule Take 1 capsule (500 mg total) by mouth 4 (four) times daily., Starting Sun 06/29/2016, Print         Quintella Reichert, MD 06/29/16 [phone]

## 2016-06-29 NOTE — ED Triage Notes (Signed)
Pt states she has had swelling and pain in both her R big toe and R knee since Thursday. States that the calf also felt warm earlier. Alert and oriented. Left BKA.

## 2016-07-01 ENCOUNTER — Encounter: Payer: Self-pay | Admitting: Family Medicine

## 2016-07-01 ENCOUNTER — Ambulatory Visit (INDEPENDENT_AMBULATORY_CARE_PROVIDER_SITE_OTHER): Payer: Medicare Other | Admitting: Family Medicine

## 2016-07-01 DIAGNOSIS — L03115 Cellulitis of right lower limb: Secondary | ICD-10-CM | POA: Diagnosis not present

## 2016-07-01 NOTE — Patient Instructions (Signed)
I would elevate your leg as tolerated.  Soak in warm water 1-2 times a day in the meantime.  Finish the antibiotics.  Take care.  Glad to see you.  Update me as needed.

## 2016-07-01 NOTE — Progress Notes (Signed)
Pre visit review using our clinic review tool, if applicable. No additional management support is needed unless otherwise documented below in the visit note. 

## 2016-07-02 NOTE — Assessment & Plan Note (Signed)
Improved, continue keflex.  Wouldn't benefit from removal of nail- she asked about that.  Would continue keflex, okay to soak in warm water, update me as needed.  She agrees.

## 2016-07-02 NOTE — Progress Notes (Signed)
Follow-up for cellulitis. Seen at emergency room. Imaging done. In the meantime started on Keflex. Still with some residual right first toe pain; foot pain and leg pain are clearly improved. Erythema is much improved. She is able to bear some weight, still with some discomfort in the right foot. Overall this is still improved from previous. No fevers. Still on Coumadin.she wanted to be rechecked today.   Meds, vitals, and allergies reviewed.   ROS: Per HPI unless specifically indicated in ROS section   nad In wheelchair but able to bear some weight on the R foot.  L leg amputation at baseline.  R foot with decreased but palpable DP pulse.  No toe erythema noted.   1st nail not ingrown.   No fluctuant mass.

## 2016-07-12 ENCOUNTER — Other Ambulatory Visit: Payer: Self-pay | Admitting: Family Medicine

## 2016-07-16 ENCOUNTER — Ambulatory Visit (INDEPENDENT_AMBULATORY_CARE_PROVIDER_SITE_OTHER): Payer: Medicare Other | Admitting: Family Medicine

## 2016-07-16 ENCOUNTER — Encounter: Payer: Self-pay | Admitting: Family Medicine

## 2016-07-16 VITALS — HR 82 | Temp 98.0°F

## 2016-07-16 DIAGNOSIS — R627 Adult failure to thrive: Secondary | ICD-10-CM | POA: Diagnosis not present

## 2016-07-16 DIAGNOSIS — S20212A Contusion of left front wall of thorax, initial encounter: Secondary | ICD-10-CM

## 2016-07-16 DIAGNOSIS — Z4789 Encounter for other orthopedic aftercare: Secondary | ICD-10-CM | POA: Diagnosis not present

## 2016-07-16 NOTE — Progress Notes (Signed)
Pre visit review using our clinic review tool, if applicable. No additional management support is needed unless otherwise documented below in the visit note. 

## 2016-07-16 NOTE — Patient Instructions (Signed)
Do gentle range of motion exercises several times a day Use your current tramadol and tylenol as needed Can apply heat as needed   Rib Contusion Introduction A rib contusion is a deep bruise on your rib area. Contusions are the result of a blunt trauma that causes bleeding and injury to the tissues under the skin. A rib contusion may involve bruising of the ribs and of the skin and muscles in the area. The skin overlying the contusion may turn blue, purple, or yellow. Minor injuries will give you a painless contusion, but more severe contusions may stay painful and swollen for a few weeks. What are the causes? A contusion is usually caused by a blow, trauma, or direct force to an area of the body. This often occurs while playing contact sports. What are the signs or symptoms?  Swelling and redness of the injured area.  Discoloration of the injured area.  Tenderness and soreness of the injured area.  Pain with or without movement. How is this diagnosed? The diagnosis can be made by taking a medical history and performing a physical exam. An X-ray, CT scan, or MRI may be needed to determine if there were any associated injuries, such as broken bones (fractures) or internal injuries. How is this treated? Often, the best treatment for a rib contusion is rest. Icing or applying cold compresses to the injured area may help reduce swelling and inflammation. Deep breathing exercises may be recommended to reduce the risk of partial lung collapse and pneumonia. Over-the-counter or prescription medicines may also be recommended for pain control. Follow these instructions at home:  Apply ice to the injured area:  Put ice in a plastic bag.  Place a towel between your skin and the bag.  Leave the ice on for 20 minutes, 2-3 times per day.  Take medicines only as directed by your health care provider.  Rest the injured area. Avoid strenuous activity and any activities or movements that cause pain.  Be careful during activities and avoid bumping the injured area.  Perform deep-breathing exercises as directed by your health care provider.  Do not lift anything that is heavier than 5 lb (2.3 kg) until your health care provider approves.  Do not use any tobacco products, including cigarettes, chewing tobacco, or electronic cigarettes. If you need help quitting, ask your health care provider. Contact a health care provider if:  You have increased bruising or swelling.  You have pain that is not controlled with treatment.  You have a fever. Get help right away if:  You have difficulty breathing or shortness of breath.  You develop a continual cough, or you cough up thick or bloody sputum.  You feel sick to your stomach (nauseous), you throw up (vomit), or you have abdominal pain. This information is not intended to replace advice given to you by your health care provider. Make sure you discuss any questions you have with your health care provider. Document Released: 05/13/2001 Document Revised: 01/24/2016 Document Reviewed: 05/30/2014  2017 Elsevier

## 2016-07-16 NOTE — Progress Notes (Signed)
Subjective:    Patient ID: [name], female    DOB: May 04, 1936, 80 y.o.   MRN: [id number]  HPI This is a 80 yo female who presents today with left sided abdominal pain- pain LUQ, up under left breast for 4 days. Pain comes and goes, is worse with certain movement. Interferes with sleep. No pain in arms, some pain in back with bending over. Never had similar pain. No dysuria, has chronic urinary frequency, has pessary. Bowel movements regular. No diarrhea or constipation. No cough, no wheeze, no SOB.  She was doing some shopping prior to pain starting, her son was getting her out of the car and encircled her trunk. Pain started after, but was worse this morning when she awoke. Pain has improved as day has progressed.   Past Medical History:  Diagnosis Date  . Anemia   . Anxiety    takes Xanax daily as needed  . Blood transfusion   . Cardiomyopathy ([place])   . Deep vein thrombosis of bilateral lower extremities (HCC)   . Degenerative joint disease   . Depression    Mild  . Diabetes mellitus    takes Glipizide daily  . Family history of anesthesia complication    DAUGHTER CPR AFTER  . GERD (gastroesophageal reflux disease)    takes Protonix daily  . Heart murmur   . History of chicken pox   . History of radiation therapy 08/30/13-09/20/13   20 gray to lower lumbar/upper sacrum  . History of shingles   . Hyperlipidemia   . Hypertension    takes Amlodipine,HCTZ,and Lisinopril daily  . Multiple myeloma    per Dr. Julien Nordmann, s/p palliative radiation for leg pain 2011 per Dr. Sondra Come  . Phlebitis   . UTI (urinary tract infection) 09/08/2013   Past Surgical History:  Procedure Laterality Date  . ABDOMINAL HYSTERECTOMY    . AMPUTATION Left 10/09/2015   Procedure: Left Leg AMPUTATION BELOW KNEE;  Surgeon: Angelia Mould, MD;  Location: [facility];  Service: Vascular;  Laterality: Left;  . ANKLE FRACTURE SURGERY Left   . APPLICATION OF WOUND VAC Left 11/16/2015   Procedure:  APPLICATION OF WOUND VAC LEFT BELOW THE KNEE AMPUTEE.;  Surgeon: Angelia Mould, MD;  Location: [facility];  Service: Vascular;  Laterality: Left;  . COLONOSCOPY    . EAR CYST EXCISION Left 04/16/2015   Procedure: EXCISION FACIAL CYST LEFT SIDE ;  Surgeon: Izora Gala, MD;  Location: [facility];  Service: ENT;  Laterality: Left;  . EYE SURGERY Bilateral    cataract surgery  . I&D EXTREMITY Left 11/16/2015   Procedure: DEBRIDEMENT OF LEFT  BELOW KNEE AMPUTATION ;  Surgeon: Angelia Mould, MD;  Location: [facility];  Service: Vascular;  Laterality: Left;  . PERIPHERAL VASCULAR CATHETERIZATION  08/14/2015   Procedure: Lower Extremity Intervention;  Surgeon: Katha Cabal, MD;  Location: [facility];  Service: Cardiovascular;;  . PERIPHERAL VASCULAR CATHETERIZATION N/A 08/14/2015   Procedure: Abdominal Aortogram w/Lower Extremity;  Surgeon: Katha Cabal, MD;  Location: [facility];  Service: Cardiovascular;  Laterality: N/A;   Family History  Problem Relation Age of Onset  . Diabetes Mother   . Hypertension Mother   . Arthritis Mother   . Stroke Mother   . Diabetes Father   . Hypertension Father   . Arthritis Father   . Stroke Father   . Cancer Sister     stomach  . Stroke Brother   .  Cancer Brother     prostate <50  . Stroke Daughter   . Stroke Son   . Arthritis Other     Parents  . Cancer Other     Colon, 1st degree relative <60  . Diabetes Other     Parents  . Stroke Other     1st degree relative <60   Social History  Substance Use Topics  . Smoking status: Former Smoker    Packs/day: 1.00    Years: 13.00    Quit date: 09/01/1973  . Smokeless tobacco: Never Used     Comment: QIUT MANY YEARS AGO  . Alcohol use No      Review of Systems Per HPI    Objective:   Physical Exam  Constitutional: She appears well-developed and well-nourished.  Thin, elderly sitting in wheelchair.   Eyes: Conjunctivae are normal.    Cardiovascular: Normal rate, regular rhythm and normal heart sounds.   Pulmonary/Chest: Effort normal and breath sounds normal. She exhibits bony tenderness (under left breast, no bruising, ethrytema, edema or rash. ).  Abdominal: She exhibits no distension. There is no tenderness.  Neurological: She is alert.  Skin: Skin is warm and dry. No rash (on trunk. ) noted.  Psychiatric: She has a normal mood and affect. Her behavior is normal.  Answers questions appropriately.   Vitals reviewed.      Wt Readings from Last 3 Encounters:  06/29/16 105 lb (47.6 kg)  02/13/16 105 lb (47.6 kg)  01/02/16 105 lb (47.6 kg)       Assessment & Plan:  1. Rib contusion, left, initial encounter - Provided written and verbal information regarding diagnosis and treatment. - continue tramadol and ibuprofen for pain, gentle ROM, heat, can use topical analgesics - RTC precautions reviewed with daughter   Clarene Reamer, FNP-BC   Primary Care at [facility], [place]  07/16/2016 2:09 PM

## 2016-07-21 ENCOUNTER — Encounter: Payer: Self-pay | Admitting: Family Medicine

## 2016-07-21 ENCOUNTER — Ambulatory Visit (INDEPENDENT_AMBULATORY_CARE_PROVIDER_SITE_OTHER): Payer: Medicare Other | Admitting: Family Medicine

## 2016-07-21 DIAGNOSIS — R0781 Pleurodynia: Secondary | ICD-10-CM | POA: Diagnosis not present

## 2016-07-21 DIAGNOSIS — M79671 Pain in right foot: Secondary | ICD-10-CM

## 2016-07-21 DIAGNOSIS — M79673 Pain in unspecified foot: Secondary | ICD-10-CM | POA: Insufficient documentation

## 2016-07-21 MED ORDER — TRAMADOL HCL 50 MG PO TABS: ORAL_TABLET | ORAL | Status: DC

## 2016-07-21 NOTE — Progress Notes (Signed)
L and R sided abd pain. Her son was helping her out of the car.  Prev eval for rib pain.  Prev note reviewed.  Still with anterior pain, along lower ribs and epigastric pain.  No back pain.  Eating okay.  Not SOB.  Prev with pain with deep breath but better now.  No blood in urine or stool.  Pain is worse with certain movements, ie trying to get up from a chair.  She prev had some reported bruising prev on the L upper abd.  Still on coumadin.  Tramadol controls the pain for about 4 hours per '50mg'$  tab.    She has had some right foot pain with walking, along the dorsal aspect of the toes on the right foot.  No trauma.  PMH and SH reviewed  ROS: Per HPI unless specifically indicated in ROS section   Meds, vitals, and allergies reviewed.   nad ncat Neck supple rrr ctab abd soft, slightly ttp on the L lower ribs anteriorly near mid clavicular line, no bruising. Normal BS Left leg irritation noted. Right foot with normal inspection. No skin breakdown. No bruising, no erythema. Toes not tender to palpation.

## 2016-07-21 NOTE — Progress Notes (Signed)
Pre visit review using our clinic review tool, if applicable. No additional management support is needed unless otherwise documented below in the visit note. 

## 2016-07-21 NOTE — Patient Instructions (Signed)
It's okay to take tramadol '50mg'$  (1 tab) every 4 to 6 hours as needed for pain.Sedation caution. The pain should gradually improve.  Update me as needed.

## 2016-07-22 DIAGNOSIS — R0781 Pleurodynia: Secondary | ICD-10-CM | POA: Insufficient documentation

## 2016-07-22 NOTE — Assessment & Plan Note (Signed)
Likely a holdover from previous repositioning. Currently taking 4 tramadol a day. Okay to gradually increase this, up to 5 and then possibly 6 pills per day. Sedation caution. She may end up needing 1 tramadol every 4 hours. Should gradually resolve, if not gradually getting better than let me know. Okay for outpatient follow-up. Likely that imaging would not change the plan at this point, and it may be more uncomfortable for her to be x-rayed at our facility, so this was deferred. She agrees. Update me as needed.

## 2016-07-22 NOTE — Assessment & Plan Note (Signed)
She likely has a mild dorsal tendinitis that is related to compensation from the previous left-sided amputation.  Discussed with patient.  Should resolve. No imaging needed.

## 2016-07-28 ENCOUNTER — Telehealth: Payer: Self-pay | Admitting: Radiology

## 2016-07-28 ENCOUNTER — Ambulatory Visit (INDEPENDENT_AMBULATORY_CARE_PROVIDER_SITE_OTHER)
Admission: RE | Admit: 2016-07-28 | Discharge: 2016-07-28 | Disposition: A | Discharge status: Home / Self Care | Payer: Medicare Other | Source: Ambulatory Visit | Attending: Family Medicine | Admitting: Family Medicine

## 2016-07-28 ENCOUNTER — Encounter: Payer: Self-pay | Admitting: Family Medicine

## 2016-07-28 ENCOUNTER — Ambulatory Visit (INDEPENDENT_AMBULATORY_CARE_PROVIDER_SITE_OTHER): Payer: Medicare Other | Admitting: Family Medicine

## 2016-07-28 ENCOUNTER — Telehealth: Payer: Self-pay

## 2016-07-28 VITALS — BP 118/60 | HR 84 | Temp 98.6°F

## 2016-07-28 DIAGNOSIS — M25561 Pain in right knee: Secondary | ICD-10-CM

## 2016-07-28 DIAGNOSIS — C9000 Multiple myeloma not having achieved remission: Secondary | ICD-10-CM | POA: Diagnosis not present

## 2016-07-28 DIAGNOSIS — D649 Anemia, unspecified: Secondary | ICD-10-CM

## 2016-07-28 LAB — CBC WITH DIFFERENTIAL/PLATELET
BASOS ABS: 0 10*3/uL (ref 0.0–0.1)
Basophils Relative: 0.5 % (ref 0.0–3.0)
EOS PCT: 1.3 % (ref 0.0–5.0)
Eosinophils Absolute: 0.1 10*3/uL (ref 0.0–0.7)
LYMPHS ABS: 1.7 10*3/uL (ref 0.7–4.0)
LYMPHS PCT: 26 % (ref 12.0–46.0)
MCHC: 32.5 g/dL (ref 30.0–36.0)
MCV: 82.8 fl (ref 78.0–100.0)
MONOS PCT: 8.5 % (ref 3.0–12.0)
Monocytes Absolute: 0.6 10*3/uL (ref 0.1–1.0)
NEUTROS PCT: 63.7 % (ref 43.0–77.0)
Neutro Abs: 4.2 10*3/uL (ref 1.4–7.7)
Platelets: 217 10*3/uL (ref 150.0–400.0)
RDW: 19 % — ABNORMAL HIGH (ref 11.5–15.5)
WBC: 6.6 10*3/uL (ref 4.0–10.5)

## 2016-07-28 LAB — COMPREHENSIVE METABOLIC PANEL
ALK PHOS: 64 U/L (ref 39–117)
ALT: 19 U/L (ref 0–35)
AST: 80 U/L — ABNORMAL HIGH (ref 0–37)
Albumin: 4.1 g/dL (ref 3.5–5.2)
BILIRUBIN TOTAL: 0.2 mg/dL (ref 0.2–1.2)
BUN: 38 mg/dL — ABNORMAL HIGH (ref 6–23)
CALCIUM: 14.4 mg/dL — AB (ref 8.4–10.5)
CO2: 28 mEq/L (ref 19–32)
Chloride: 107 mEq/L (ref 96–112)
Creatinine, Ser: 1.55 mg/dL — ABNORMAL HIGH (ref 0.40–1.20)
GFR: 41.59 mL/min — AB (ref >60.00)
GLUCOSE: 69 mg/dL — AB (ref 70–99)
POTASSIUM: 3.9 meq/L (ref 3.5–5.1)
Sodium: 143 mEq/L (ref 135–145)
TOTAL PROTEIN: 7 g/dL (ref 6.0–8.3)

## 2016-07-28 LAB — PROTIME-INR
INR: 4.7 ratio — ABNORMAL HIGH (ref 0.8–1.0)
Prothrombin Time: 51.5 s — ABNORMAL HIGH (ref 9.6–13.1)

## 2016-07-28 NOTE — Progress Notes (Signed)
Pre visit review using our clinic review tool, if applicable. No additional management support is needed unless otherwise documented below in the visit note. 

## 2016-07-28 NOTE — Telephone Encounter (Signed)
I have not seen the patient for several months now. We will need to see her back and see where she stands with her multiple myeloma. I will ask my nurse to arrange a follow-up appointment for her unless she is receiving treatment somewhere else. Thank you

## 2016-07-28 NOTE — Progress Notes (Signed)
R leg pain after a fall x2 over the weekend.  She was going to [place] and fell with the first episode, in the midst of a pivot.  Family helped her up.  No head injury.    Next fall was the next day, when she slid out of the bed, may not have had enough traction on R foot when trying to stand up.  Family helped her up.  No apparent injury.    R leg is most painful at the R knee.  More lateral pain.  Can weight bear but with pain.    Sleep has been very fragmented in the meantime.  Last took tramadol early this AM.  No fevers.  She has had waxing and waning alertness- d/w pt and family at [facility] and this was thought to be at least in part due to sig sleep deprivation due to R knee pain in the last few days.    PMH and SH reviewed  ROS: Per HPI unless specifically indicated in ROS section   Meds, vitals, and allergies reviewed.   Nad, chronically but not acutely ill appearing Knows date and month of birth, that thanksgiving was recent holiday.   Mmm Neck supple, no LA rrr ctab abd soft Ext w/o edema R knee not red or bruised, not warm to touch.  crepitus on ROM but normal ROM o/w.  Slightly ttp on the B joint lines with medial effusion vs bursitis noted.

## 2016-07-28 NOTE — Telephone Encounter (Signed)
Patient's daughter advised.  

## 2016-07-28 NOTE — Patient Instructions (Signed)
Ice the knee in the meantime.  Go to the lab on the way out.  We'll contact you with your lab and xray report.  If fever, then to the hospital.  Hold tramadol if sleepy or tired.   Take care.  Glad to see you.

## 2016-07-28 NOTE — Telephone Encounter (Signed)
Call family.  Hgb low.  Hold coumadin in the meantime.  If bleeding or CP or worse overnight, then to ER.  I don't have all of her other labs back yet.   Please call hematology in the AM about getting her set up to be transfused and for f/u either there or here.  Thanks.   I routed this to Dr. Julien Nordmann in the meantime.

## 2016-07-28 NOTE — Telephone Encounter (Signed)
[name] called for pt. Pt last seen at [facility] in December 2016 by Dr Julien Nordmann. Last kyprolis in January 2017. Chemo on hold for pt to have surgery on L leg. 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 [name] states her right leg is hurting her and has an edematous place on the side of her knee. She has a call into her PCP. Instructed her to make appt with PCP but to call us back if that does not happen. Instructed her we will make an appt with Dr Julien Nordmann now that the left leg is issue is completed. Forwarded to Dr Julien Nordmann for input on time frame of next appt.

## 2016-07-28 NOTE — Telephone Encounter (Signed)
[facility] called critical results, HGB - 6.9, HCT - 21.3. Results given to Dr [name]

## 2016-07-29 ENCOUNTER — Encounter (HOSPITAL_COMMUNITY): Payer: Self-pay | Admitting: Emergency Medicine

## 2016-07-29 ENCOUNTER — Telehealth: Payer: Self-pay | Admitting: *Deleted

## 2016-07-29 ENCOUNTER — Other Ambulatory Visit: Payer: Self-pay | Admitting: Medical Oncology

## 2016-07-29 ENCOUNTER — Inpatient Hospital Stay (HOSPITAL_COMMUNITY)
Admission: EM | Admit: 2016-07-29 | Discharge: 2016-08-03 | DRG: 640 | Disposition: A | Discharge status: Home Health | Payer: Medicare Other | Attending: Internal Medicine | Admitting: Internal Medicine

## 2016-07-29 ENCOUNTER — Telehealth: Payer: Self-pay | Admitting: Medical Oncology

## 2016-07-29 DIAGNOSIS — Z87891 Personal history of nicotine dependence: Secondary | ICD-10-CM | POA: Diagnosis not present

## 2016-07-29 DIAGNOSIS — I825Z2 Chronic embolism and thrombosis of unspecified deep veins of left distal lower extremity: Secondary | ICD-10-CM | POA: Diagnosis not present

## 2016-07-29 DIAGNOSIS — Z923 Personal history of irradiation: Secondary | ICD-10-CM

## 2016-07-29 DIAGNOSIS — Z809 Family history of malignant neoplasm, unspecified: Secondary | ICD-10-CM

## 2016-07-29 DIAGNOSIS — Z9119 Patient's noncompliance with other medical treatment and regimen: Secondary | ICD-10-CM | POA: Diagnosis not present

## 2016-07-29 DIAGNOSIS — C9000 Multiple myeloma not having achieved remission: Secondary | ICD-10-CM | POA: Diagnosis present

## 2016-07-29 DIAGNOSIS — Z8261 Family history of arthritis: Secondary | ICD-10-CM | POA: Diagnosis not present

## 2016-07-29 DIAGNOSIS — Z833 Family history of diabetes mellitus: Secondary | ICD-10-CM

## 2016-07-29 DIAGNOSIS — I82401 Acute embolism and thrombosis of unspecified deep veins of right lower extremity: Secondary | ICD-10-CM | POA: Diagnosis present

## 2016-07-29 DIAGNOSIS — Z79899 Other long term (current) drug therapy: Secondary | ICD-10-CM

## 2016-07-29 DIAGNOSIS — I82402 Acute embolism and thrombosis of unspecified deep veins of left lower extremity: Secondary | ICD-10-CM | POA: Diagnosis present

## 2016-07-29 DIAGNOSIS — E876 Hypokalemia: Secondary | ICD-10-CM | POA: Diagnosis present

## 2016-07-29 DIAGNOSIS — Z7901 Long term (current) use of anticoagulants: Secondary | ICD-10-CM | POA: Diagnosis not present

## 2016-07-29 DIAGNOSIS — Z8249 Family history of ischemic heart disease and other diseases of the circulatory system: Secondary | ICD-10-CM | POA: Diagnosis not present

## 2016-07-29 DIAGNOSIS — Z823 Family history of stroke: Secondary | ICD-10-CM

## 2016-07-29 DIAGNOSIS — G9341 Metabolic encephalopathy: Secondary | ICD-10-CM | POA: Diagnosis present

## 2016-07-29 DIAGNOSIS — I82403 Acute embolism and thrombosis of unspecified deep veins of lower extremity, bilateral: Secondary | ICD-10-CM

## 2016-07-29 DIAGNOSIS — I10 Essential (primary) hypertension: Secondary | ICD-10-CM | POA: Diagnosis present

## 2016-07-29 DIAGNOSIS — K219 Gastro-esophageal reflux disease without esophagitis: Secondary | ICD-10-CM | POA: Diagnosis present

## 2016-07-29 DIAGNOSIS — E785 Hyperlipidemia, unspecified: Secondary | ICD-10-CM | POA: Diagnosis not present

## 2016-07-29 DIAGNOSIS — Z86718 Personal history of other venous thrombosis and embolism: Secondary | ICD-10-CM | POA: Diagnosis not present

## 2016-07-29 DIAGNOSIS — E119 Type 2 diabetes mellitus without complications: Secondary | ICD-10-CM | POA: Diagnosis present

## 2016-07-29 DIAGNOSIS — R410 Disorientation, unspecified: Secondary | ICD-10-CM | POA: Diagnosis not present

## 2016-07-29 DIAGNOSIS — E860 Dehydration: Secondary | ICD-10-CM | POA: Diagnosis present

## 2016-07-29 DIAGNOSIS — Z7984 Long term (current) use of oral hypoglycemic drugs: Secondary | ICD-10-CM | POA: Diagnosis not present

## 2016-07-29 DIAGNOSIS — C9002 Multiple myeloma in relapse: Secondary | ICD-10-CM | POA: Diagnosis present

## 2016-07-29 DIAGNOSIS — D649 Anemia, unspecified: Secondary | ICD-10-CM | POA: Insufficient documentation

## 2016-07-29 DIAGNOSIS — D630 Anemia in neoplastic disease: Secondary | ICD-10-CM | POA: Diagnosis present

## 2016-07-29 DIAGNOSIS — M25569 Pain in unspecified knee: Secondary | ICD-10-CM | POA: Insufficient documentation

## 2016-07-29 DIAGNOSIS — Z89612 Acquired absence of left leg above knee: Secondary | ICD-10-CM

## 2016-07-29 LAB — GLUCOSE, CAPILLARY
GLUCOSE-CAPILLARY: 137 mg/dL — AB (ref 65–99)
GLUCOSE-CAPILLARY: 101 mg/dL — AB (ref 65–99)
Glucose-Capillary: 95 mg/dL (ref 65–99)

## 2016-07-29 LAB — CBC WITH DIFFERENTIAL/PLATELET
BASOS ABS: 0 10*3/uL (ref 0.0–0.1)
Basophils Relative: 0 %
Eosinophils Absolute: 0.1 10*3/uL (ref 0.0–0.7)
Eosinophils Relative: 1 %
HEMATOCRIT: 21.8 % — AB (ref 36.0–46.0)
HEMOGLOBIN: 7 g/dL — AB (ref 12.0–15.0)
LYMPHS PCT: 24 %
Lymphs Abs: 1.7 10*3/uL (ref 0.7–4.0)
MCH: 27 pg (ref 26.0–34.0)
MCHC: 32.1 g/dL (ref 30.0–36.0)
MCV: 84.2 fL (ref 78.0–100.0)
Monocytes Absolute: 0.7 10*3/uL (ref 0.1–1.0)
Monocytes Relative: 10 %
NEUTROS ABS: 4.6 10*3/uL (ref 1.7–7.7)
NEUTROS PCT: 65 %
Platelets: 220 10*3/uL (ref 150–400)
RBC: 2.59 MIL/uL — AB (ref 3.87–5.11)
RDW: 17.4 % — ABNORMAL HIGH (ref 11.5–15.5)
WBC: 7.1 10*3/uL (ref 4.0–10.5)

## 2016-07-29 LAB — COMPREHENSIVE METABOLIC PANEL
ALBUMIN: 4.2 g/dL (ref 3.5–5.0)
ALT: 20 U/L (ref 14–54)
AST: 88 U/L — AB (ref 15–41)
Alkaline Phosphatase: 66 U/L (ref 38–126)
Anion gap: 8 (ref 5–15)
BUN: 36 mg/dL — AB (ref 6–20)
CHLORIDE: 107 mmol/L (ref 101–111)
CO2: 25 mmol/L (ref 22–32)
CREATININE: 1.82 mg/dL — AB (ref 0.44–1.00)
Calcium: 14.6 mg/dL (ref 8.9–10.3)
GFR calc Af Amer: 30 mL/min — ABNORMAL LOW (ref >60)
GFR calc non Af Amer: 25 mL/min — ABNORMAL LOW (ref >60)
GLUCOSE: 127 mg/dL — AB (ref 65–99)
POTASSIUM: 3.7 mmol/L (ref 3.5–5.1)
Sodium: 140 mmol/L (ref 135–145)
Total Bilirubin: 0.4 mg/dL (ref 0.3–1.2)
Total Protein: 7.1 g/dL (ref 6.5–8.1)

## 2016-07-29 LAB — OCCULT BLOOD X 1 CARD TO LAB, STOOL: FECAL OCCULT BLD: POSITIVE — AB

## 2016-07-29 LAB — PROTIME-INR
INR: 3.8
Prothrombin Time: 38.4 seconds — ABNORMAL HIGH (ref 11.4–15.2)

## 2016-07-29 LAB — PREPARE RBC (CROSSMATCH)

## 2016-07-29 MED ORDER — SODIUM CHLORIDE 0.9 % IV SOLN
INTRAVENOUS | Status: DC
  Administered 2016-07-29 – 2016-07-31 (×3): via INTRAVENOUS

## 2016-07-29 MED ORDER — INSULIN ASPART 100 UNIT/ML ~~LOC~~ SOLN
0.0000 [IU] | Freq: Three times a day (TID) | SUBCUTANEOUS | Status: DC
Start: 2016-07-29 — End: 2016-08-03
  Administered 2016-07-29 – 2016-07-30 (×2): 2 [IU] via SUBCUTANEOUS
  Administered 2016-07-30: 1 [IU] via SUBCUTANEOUS
  Administered 2016-07-31: 2 [IU] via SUBCUTANEOUS

## 2016-07-29 MED ORDER — SODIUM CHLORIDE 0.9 % IV SOLN
Freq: Once | INTRAVENOUS | Status: AC
  Administered 2016-07-29: 16:00:00 via INTRAVENOUS

## 2016-07-29 MED ORDER — ACETAMINOPHEN 500 MG PO TABS
500.0000 mg | ORAL_TABLET | Freq: Four times a day (QID) | ORAL | Status: DC | PRN
  Administered 2016-08-01 (×2): 500 mg via ORAL
  Filled 2016-07-29 (×2): qty 1

## 2016-07-29 MED ORDER — ALPRAZOLAM 0.5 MG PO TABS
0.5000 mg | ORAL_TABLET | Freq: Two times a day (BID) | ORAL | Status: DC | PRN
  Administered 2016-07-30 – 2016-08-02 (×4): 0.5 mg via ORAL
  Filled 2016-07-29 (×4): qty 1

## 2016-07-29 MED ORDER — SODIUM CHLORIDE 0.9% FLUSH
3.0000 mL | Freq: Two times a day (BID) | INTRAVENOUS | Status: DC
  Administered 2016-07-29 – 2016-08-02 (×2): 3 mL via INTRAVENOUS

## 2016-07-29 MED ORDER — TRAMADOL HCL 50 MG PO TABS
50.0000 mg | ORAL_TABLET | Freq: Four times a day (QID) | ORAL | Status: DC | PRN
  Administered 2016-08-01: 50 mg via ORAL
  Filled 2016-07-29: qty 1

## 2016-07-29 MED ORDER — OXYCODONE HCL 5 MG PO TABS
5.0000 mg | ORAL_TABLET | ORAL | Status: DC | PRN
  Administered 2016-08-02: 10 mg via ORAL
  Filled 2016-07-29: qty 2

## 2016-07-29 MED ORDER — WARFARIN - PHARMACIST DOSING INPATIENT: Freq: Every day | Status: DC

## 2016-07-29 MED ORDER — PANTOPRAZOLE SODIUM 40 MG PO TBEC
40.0000 mg | DELAYED_RELEASE_TABLET | Freq: Every day | ORAL | Status: DC
  Administered 2016-07-29 – 2016-08-02 (×5): 40 mg via ORAL
  Filled 2016-07-29 (×5): qty 1

## 2016-07-29 MED ORDER — SODIUM CHLORIDE 0.9 % IV BOLUS (SEPSIS)
1000.0000 mL | Freq: Once | INTRAVENOUS | Status: AC
  Administered 2016-07-29: 1000 mL via INTRAVENOUS

## 2016-07-29 MED ORDER — INSULIN ASPART 100 UNIT/ML ~~LOC~~ SOLN
0.0000 [IU] | Freq: Every day | SUBCUTANEOUS | Status: DC
Start: 2016-07-29 — End: 2016-08-03

## 2016-07-29 NOTE — Telephone Encounter (Signed)
Daughter says the on-call MD advised that they go to the ER last night but patient refused. Triage RN at ER called patient this morning and is now trying to contact Dr. Earlie Server also. Dr. [name] says that upon completion of lab results, calcium is elevated and it is recommended that the patient go to the ER this morning for re-evaluation. Daughter advised.

## 2016-07-29 NOTE — Telephone Encounter (Signed)
Please call her back.  That would have been the plan (short stay transfusion) except for the calcium elevation.  I would still go to ER and have them recheck her labs and make a decision about admission.  If I know what ER she is going to, I can call ahead.  Thanks.

## 2016-07-29 NOTE — ED Notes (Signed)
Per PCP sent here for high Calcium and low hemoglobin-states frequent falls and general weakness-

## 2016-07-29 NOTE — Telephone Encounter (Signed)
[facility] Patient Name: [name] Gender: Female DOB: [birth year] Age: 80 Y 2 D Return Phone Number: Address: City/State/Zip: [place] Client [place] Night - Client Client Site [facility] Physician AA - PHYSICIAN, NOT LISTED- MD Contact Type Call Who Is Calling Lab Lab Name [facility] Phone Number [phone] Lab Tech Name [facility] Lab Reference Number Chief Complaint Lab Result (Critical or Stat) Call Type Lab Send to RN Reason for Call Report lab results Initial Comment Caller states is [facility] and have critical lab results for Dr. [name]. Additional Comment Translation No Nurse Assessment Nurse: Windle Guard, RN, Lesa Date/Time (Eastern Time): 07/28/2016 5:42:38 PM Confirm and document reason for call. If symptomatic, describe symptoms. You must click the next button to save text entered. ---Caller states she has a critical lab result Does the patient have any new or worsening symptoms? ---No Nurse: Windle Guard, RN, Lesa Date/Time (Eastern Time): 07/28/2016 5:43:00 PM Is there an on-call provider listed? ---Yes Please list name of person reporting value (Lab Employee) and a contact number. ---[place], [phone] Please document the following items: Lab name Lab value (read back to lab to verify) Reference range for lab value Date and time blood was drawn Collect time of birth for bilirubin results ---Calcium 14.3, ( 8.4-10.5), drawn 07/28/16 at 1:56 pm Please collect the patient contact information from the lab. (name, phone number and address) ---[name], [phone] or [phone], [address] [phone] Guidelines Guideline Title Affirmed Question Affirmed Notes Nurse Date/Time Eilene Ghazi Time) Disp. Time Eilene Ghazi Time) Disposition Final User 07/28/2016 5:49:30 PM Called On-Call Provider Conner,  RN, Lesa 07/28/2016 5:55:14 PM Clinical Call Yes Conner, RN, Lesa PLEASE NOTE: All timestamps contained within this report are represented as [place] Standard Time. CONFIDENTIALTY NOTICE: This fax transmission is intended only for the addressee. It contains information that is legally privileged, confidential or otherwise protected from use or disclosure. If you are not the intended recipient, you are strictly prohibited from reviewing, disclosing, copying using or disseminating any of this information or taking any action in reliance on or regarding this information. If you have received this fax in error, please notify us immediately by telephone so that we can arrange for its return to [place]. Phone: [phone], Toll-Free: [phone], Fax: [phone] Page: 2 of 2 Call Id: [id number] Comments User: Starleen Blue, RN Date/Time Eilene Ghazi Time): 07/28/2016 5:54:35 PM Spoke with patient's dtr to give Dr. Judeen Hammans instructions. Verbalizes understanding and will be going to [facility] ED Paging DoctorName Phone DateTime Result/Outcome Message Type Notes Walker Kehr MD [phone] 07/28/2016 5:49:30 PM Called On Call Provider - Reached Doctor Paged Walker Kehr - MD 07/28/2016 5:50:10 PM Spoke with On Call - General Message Result Spoke with Dr. Alain Marion and he states to call patient and tell her to go to ED

## 2016-07-29 NOTE — H&P (Signed)
History and Physical    [name] UUV:[id number] DOB: Sep 13, 1935 DOA: 07/29/2016  PCP: [name], MD  Patient coming from: home  Chief Complaint: generalized weakness, confusion  HPI: [name] is a 80 y.o. female with medical history significant of MM lost to f/u, anxiety, essential htn, diabetes, and depression. Who presented to the hospital after the daughter saw her with her principal problems listed above. This is been a problem which has been getting gradually worse within the last 2 weeks. 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 I'm unable to get history from patient as she is confused as such history is obtained from chart, ED physician, and daughter. Reportedly patient had 2 falls within the last week or so. Given her continued weakness and new problem of confusion patient was brought in for evaluation. The confusion is reported as a problem that began roughly one or 2 days ago. This has been persistent. Patient has been answering questions with answers that don't make sense to daughter. This has waxed and waned reportedly but is gradually getting worse  ED Course: Patient was found to have a hemoglobin of 7.0 with a calcium level 14.6. We were subsequently consulted for further medical evaluation recommendations.  Review of Systems: As per HPI otherwise 10 point review of systems negative.    Past Medical History:  Diagnosis Date  . Anemia   . Anxiety    takes Xanax daily as needed  . Blood transfusion   . Cardiomyopathy ([place])   . Deep vein thrombosis of bilateral lower extremities (HCC)   . Degenerative joint disease   . Depression    Mild  . Diabetes mellitus    takes Glipizide daily  . Family history of anesthesia complication    DAUGHTER CPR AFTER  . GERD (gastroesophageal reflux disease)    takes  Protonix daily  . Heart murmur   . History of chicken pox   . History of radiation therapy 08/30/13-09/20/13   20 gray to lower lumbar/upper sacrum  . History of shingles   . Hyperlipidemia   . Hypertension    takes Amlodipine,HCTZ,and Lisinopril daily  . Multiple myeloma    per Dr. Julien Nordmann, s/p palliative radiation for leg pain 2011 per Dr. Sondra Come  . Phlebitis   . UTI (urinary tract infection) 09/08/2013    Past Surgical History:  Procedure Laterality Date  . ABDOMINAL HYSTERECTOMY    . AMPUTATION Left 10/09/2015   Procedure: Left Leg AMPUTATION BELOW KNEE;  Surgeon: Angelia Mould, MD;  Location: [facility];  Service: Vascular;  Laterality: Left;  . ANKLE FRACTURE SURGERY Left   . APPLICATION OF WOUND VAC Left 11/16/2015   Procedure: APPLICATION OF WOUND VAC LEFT BELOW THE KNEE AMPUTEE.;  Surgeon: Angelia Mould, MD;  Location: [facility];  Service: Vascular;  Laterality: Left;  . COLONOSCOPY    . EAR CYST EXCISION Left 04/16/2015   Procedure: EXCISION FACIAL CYST LEFT SIDE ;  Surgeon: Izora Gala, MD;  Location: [facility];  Service: ENT;  Laterality: Left;  . EYE SURGERY Bilateral    cataract surgery  . I&D EXTREMITY Left 11/16/2015   Procedure: DEBRIDEMENT OF LEFT  BELOW KNEE AMPUTATION ;  Surgeon: Angelia Mould, MD;  Location: [facility];  Service: Vascular;  Laterality: Left;  . PERIPHERAL VASCULAR CATHETERIZATION  08/14/2015  Procedure: Lower Extremity Intervention;  Surgeon: Katha Cabal, MD;  Location: [facility];  Service: Cardiovascular;;  . PERIPHERAL VASCULAR CATHETERIZATION N/A 08/14/2015   Procedure: Abdominal Aortogram w/Lower Extremity;  Surgeon: Katha Cabal, MD;  Location: [facility];  Service: Cardiovascular;  Laterality: N/A;     reports that she quit smoking about 42 years ago. She has a 13.00 pack-year smoking history. She has never used smokeless tobacco. She reports that she does not drink alcohol or use  drugs.  No Known Allergies  Family History  Problem Relation Age of Onset  . Arthritis Other     Parents  . Cancer Other     Colon, 1st degree relative <60  . Diabetes Other     Parents  . Stroke Other     1st degree relative <60  . Diabetes Mother   . Hypertension Mother   . Arthritis Mother   . Stroke Mother   . Diabetes Father   . Hypertension Father   . Arthritis Father   . Stroke Father   . Cancer Sister     stomach  . Stroke Brother   . Cancer Brother     prostate <50  . Stroke Daughter   . Stroke Son     Prior to Admission medications   Medication Sig Start Date End Date Taking? Authorizing Provider  acetaminophen (TYLENOL) 500 MG tablet Take 500 mg by mouth every 6 (six) hours as needed for mild pain.   Yes Historical Provider, MD  ALPRAZolam Duanne Moron) 0.5 MG tablet Take 1 tablet (0.5 mg total) by mouth 2 (two) times daily as needed. for anxiety 06/11/16  Yes Tonia Ghent, MD  amLODipine (NORVASC) 5 MG tablet Take 1 tablet (5 mg total) by mouth daily. 06/11/16  Yes Tonia Ghent, MD  Calcium Carbonate-Vitamin D 600-400 MG-UNIT tablet Take 1 tablet by mouth daily. 10/19/15  Yes Daniel J Angiulli, PA-C  diclofenac sodium (VOLTAREN) 1 % GEL Apply 2 g topically 2 (two) times daily as needed (for shoulder pain). 06/11/16  Yes Tonia Ghent, MD  glipiZIDE (GLUCOTROL XL) 2.5 MG 24 hr tablet Take 1 tablet (2.5 mg total) by mouth daily. 06/11/16  Yes Tonia Ghent, MD  hydrochlorothiazide (MICROZIDE) 12.5 MG capsule Take 1 capsule (12.5 mg total) by mouth daily. 06/11/16  Yes Tonia Ghent, MD  lisinopril (PRINIVIL,ZESTRIL) 5 MG tablet Take 1 tablet (5 mg total) by mouth daily. 06/11/16  Yes Tonia Ghent, MD  pantoprazole (PROTONIX) 40 MG tablet Take 1 tablet (40 mg total) by mouth daily. 06/11/16  Yes Tonia Ghent, MD  potassium chloride SA (K-DUR,KLOR-CON) 20 MEQ tablet Take 1 tablet (20 mEq total) by mouth daily. 06/11/16  Yes Tonia Ghent, MD  traMADol  (ULTRAM) 50 MG tablet '50mg'$  (1 tab) every 4 to 6 hours as needed for pain.  Sedation caution. 07/21/16  Yes Tonia Ghent, MD  warfarin (COUMADIN) 5 MG tablet Take 1 tablet (5 mg total) by mouth daily. 06/11/16  Yes Tonia Ghent, MD  glucose blood (ONE TOUCH ULTRA TEST) test strip One touch ultra 2 test strips.  Use daily.  Dx e11.9. 02/05/16   Tonia Ghent, MD  ONE TOUCH LANCETS MISC USE TO CHECK BLOOD SUGAR TWO TIMES DAILY.    DX:  E11.9 02/07/16   Tonia Ghent, MD    Physical Exam: Vitals:   07/29/16 1114 07/29/16 1115 07/29/16 1330 07/29/16 1355  BP: 147/68  147/68 163/67 (!) 145/64  Pulse: 67 70 76 82  Resp: '16 15 21 18  '$ Temp:      TempSrc:      SpO2: 100% 98% 93% 100%   Constitutional: NAD, calm, comfortable Vitals:   07/29/16 1114 07/29/16 1115 07/29/16 1330 07/29/16 1355  BP: 147/68 147/68 163/67 (!) 145/64  Pulse: 67 70 76 82  Resp: '16 15 21 18  '$ Temp:      TempSrc:      SpO2: 100% 98% 93% 100%   Eyes: PERRL, lids and conjunctivae palor ENMT: Mucous membranes are moist. Posterior pharynx clear of any exudate or lesions.  Neck: normal, supple, no masses, no thyromegaly Respiratory: clear to auscultation bilaterally, no wheezing, no crackles. Normal respiratory effort. No accessory muscle use.  Cardiovascular: Regular rate and rhythm, + systolic outflow murmur. No extremity edema. 2+ pedal pulses.  Abdomen: no tenderness, no masses palpated. No hepatosplenomegaly. Bowel sounds positive.  Musculoskeletal: no clubbing / cyanosis. L BKA Skin: no rashes, lesions, ulcers. No induration Neurologic: no facial asymmetry. Sensation intact Psychiatric: Pleasantly confused, mood and affect appropriate.  Labs on Admission: I have personally reviewed following labs and imaging studies  CBC:  Recent Labs Lab 07/28/16 1356 07/29/16 1054  WBC 6.6 7.1  NEUTROABS 4.2 4.6  HGB 6.9 Repeated and verified X2.* 7.0*  HCT 21.3 Repeated and verified X2.* 21.8*  MCV 82.8 84.2  PLT  217.0 814   Basic Metabolic Panel:  Recent Labs Lab 07/28/16 1356 07/29/16 1054  NA 143 140  K 3.9 3.7  CL 107 107  CO2 28 25  GLUCOSE 69* 127*  BUN 38* 36*  CREATININE 1.55* 1.82*  CALCIUM 14.4* 14.6*   GFR: CrCl cannot be calculated (Unknown ideal weight.). Liver Function Tests:  Recent Labs Lab 07/28/16 1356 07/29/16 1054  AST 80* 88*  ALT 19 20  ALKPHOS 64 66  BILITOT 0.2 0.4  PROT 7.0 7.1  ALBUMIN 4.1 4.2   No results for input(s): LIPASE, AMYLASE in the last 168 hours. No results for input(s): AMMONIA in the last 168 hours. Coagulation Profile:  Recent Labs Lab 07/28/16 1356 07/29/16 1054  INR 4.7* 3.80   Cardiac Enzymes: No results for input(s): CKTOTAL, CKMB, CKMBINDEX, TROPONINI in the last 168 hours. BNP (last 3 results) No results for input(s): PROBNP in the last 8760 hours. HbA1C: No results for input(s): HGBA1C in the last 72 hours. CBG:  Recent Labs Lab 07/29/16 1433  GLUCAP 95   Lipid Profile: No results for input(s): CHOL, HDL, LDLCALC, TRIG, CHOLHDL, LDLDIRECT in the last 72 hours. Thyroid Function Tests: No results for input(s): TSH, T4TOTAL, FREET4, T3FREE, THYROIDAB in the last 72 hours. Anemia Panel: No results for input(s): VITAMINB12, FOLATE, FERRITIN, TIBC, IRON, RETICCTPCT in the last 72 hours. Urine analysis:    Component Value Date/Time   COLORURINE YELLOW 09/10/2015 [place] 09/10/2015 1754   LABSPEC 1.013 09/10/2015 1754   LABSPEC 1.020 10/19/2013 1546   PHURINE 7.0 09/10/2015 1754   GLUCOSEU 100 (A) 09/10/2015 1754   GLUCOSEU 1,000 10/19/2013 1546   HGBUR SMALL (A) 09/10/2015 1754   BILIRUBINUR NEGATIVE 09/10/2015 1754   BILIRUBINUR negative 05/18/2015 1545   BILIRUBINUR Negative 10/19/2013 1546   KETONESUR NEGATIVE 09/10/2015 1754   PROTEINUR NEGATIVE 09/10/2015 1754   UROBILINOGEN negative 05/18/2015 1545   UROBILINOGEN 0.2 03/10/2014 0007   UROBILINOGEN 0.2 10/19/2013 1546   NITRITE  NEGATIVE 09/10/2015 1754   LEUKOCYTESUR SMALL (A) 09/10/2015 1754   LEUKOCYTESUR  Negative 10/19/2013 1546   Sepsis Labs: !!!!!!!!!!!!!!!!!!!!!!!!!!!!!!!!!!!!!!!!!!!! '@LABRCNTIP'$ (procalcitonin:4,lacticidven:4) )No results found for this or any previous visit (from the past 240 hour(s)).   Radiological Exams on Admission: Dg Knee Complete 4 Views Right  Result Date: 07/28/2016 CLINICAL DATA:  Right knee pain, fall EXAM: RIGHT KNEE - COMPLETE 4+ VIEW COMPARISON:  03/29/2010 FINDINGS: Four views of the right knee submitted. No acute fracture or subluxation. There is significant narrowing of medial joint compartment. Spurring of medial femoral condyle and medial tibial plateau. Narrowing of patellofemoral joint space. Atherosclerotic calcifications of femoral and popliteal artery. IMPRESSION: No acute fracture or subluxation. Osteoarthritic changes with progression from prior exam. Electronically Signed   By: Lahoma Crocker M.D.   On: 07/28/2016 15:32    EKG: Independently reviewed. Sinus rhythm with no ST elevation or depressions  Assessment/Plan Active Problems:   Hypercalcemia - Most likely secondary to MM - consulted oncology for assistance with case - Place on Fluids - reassess level next am. Ordered  Profound anemia - suspect MM contributing - transfuse 1 unit of PRBC  Generalized weakness - consult PT    Multiple myeloma ([place]) - oncology consulted. Encouraged patient and family to adhere to f/u's    Diabetes mellitus without complication ([place]) - hold oral hypoglycemic agents - place on SSI and     Essential hypertension - Holding blood pressures     Deep vein thrombosis of right lower extremity (HCC)/Long term current use of anticoagulant therapy - Coumadin per pharmacy consult   DVT prophylaxis: pt on coumadin Code Status: full Family Communication: d/c patient and daughter at bedside. Disposition Plan: telemetry Consults called: Oncology Admission status:  inpatient   Velvet Bathe MD [facility] Pager [phone]  If 7PM-7AM, please contact night-coverage [email or web address] Password [facility]  07/29/2016, 2:53 PM

## 2016-07-29 NOTE — Telephone Encounter (Signed)
"  [facility] calling for mom [name].  We called yesterday and unable to see Dr. Julien Nordmann.  Saw PCP who has faxed lab results to Dr. Julien Nordmann.  Her blood level is low.  PCP said Dr. Julien Nordmann may need to set her up for transfusion.  Has been out of rehab since April.  Return number [phone]."  Per EPIC lab results, Hgb = 6.9.

## 2016-07-29 NOTE — Telephone Encounter (Signed)
See below.  Please call family and get update.  Per note, on call MD had advised ER last night.  I presume this was when the calcium was resulted- I was not called about calcium level but I saw it in EMR this AM.  Thanks.

## 2016-07-29 NOTE — ED Triage Notes (Signed)
Patient was at [facility] yesterday for weakness and right leg and right side pain.  They were called yesterday saying that blood count levels were low and Calcium was high.  Patient has knot on right proximal side of knee that was told bursitis over 3 weeks now.

## 2016-07-29 NOTE — ED Notes (Signed)
1 unsuccessful attempt at peripheral iv.

## 2016-07-29 NOTE — ED Provider Notes (Signed)
[facility] DEPT Provider Note   CSN: [id number] Arrival date & time: 07/29/16  1012     History   Chief Complaint Chief Complaint  Patient presents with  . abnormal lab values    HPI [name] is a 80 y.o. female.  Patient complains of weakness not eating or drinking much over the last few days. She saw her doctor who stated her calcium was high and her PT was too elevated   The history is provided by the patient and a relative. No language interpreter was used.  Weakness  Primary symptoms include no focal weakness. This is a recurrent problem. The current episode started more than 2 days ago. The problem has not changed since onset.There was no focality noted. There has been no fever. Pertinent negatives include no shortness of breath, no chest pain and no headaches. There were no medications administered prior to arrival. Associated medical issues do not include trauma.    Past Medical History:  Diagnosis Date  . Anemia   . Anxiety    takes Xanax daily as needed  . Blood transfusion   . Cardiomyopathy ([place])   . Deep vein thrombosis of bilateral lower extremities (HCC)   . Degenerative joint disease   . Depression    Mild  . Diabetes mellitus    takes Glipizide daily  . Family history of anesthesia complication    DAUGHTER CPR AFTER  . GERD (gastroesophageal reflux disease)    takes Protonix daily  . Heart murmur   . History of chicken pox   . History of radiation therapy 08/30/13-09/20/13   20 gray to lower lumbar/upper sacrum  . History of shingles   . Hyperlipidemia   . Hypertension    takes Amlodipine,HCTZ,and Lisinopril daily  . Multiple myeloma    per Dr. Julien Nordmann, s/p palliative radiation for leg pain 2011 per Dr. Sondra Come  . Phlebitis   . UTI (urinary tract infection) 09/08/2013    Patient Active Problem List   Diagnosis Date Noted  . Anemia 07/29/2016  . Hypercalcemia 07/29/2016  . Knee pain 07/29/2016  . Rib pain 07/22/2016  . Foot pain  07/21/2016  . Pain in joint, shoulder region 06/12/2016  . Wound dehiscence 11/16/2015  . Anxiety state   . Chronic anticoagulation   . History of DVT (deep vein thrombosis)   . Acute blood loss anemia   . Post-operative pain   . Status post below knee amputation of left lower extremity ([place]) 10/12/2015  . PAD (peripheral artery disease) ([place]) 10/09/2015  . Dry gangrene ([place]) 09/13/2015  . PVD (peripheral vascular disease) ([place]) 09/09/2015  . Encounter for antineoplastic chemotherapy 06/26/2015  . Chronic pain 02/07/2015  . Hypokalemia 11/28/2014  . Long term current use of anticoagulant therapy 11/28/2014  . Fissure in skin of foot 11/28/2014  . Sinus bradycardia 09/08/2014  . DNR no code (do not resuscitate) 09/08/2013  . Chronic steroid use 09/08/2013  . Deep vein thrombosis of left lower extremity ([place]) 05/12/2013  . Deep vein thrombosis of right lower extremity ([place]) 05/12/2013  . Left hip pain 05/16/2012  . Gait instability 05/08/2011  . Multiple myeloma ([place]) 03/29/2010  . Diabetes mellitus without complication ([place]) 40/98/1191  . DEPRESSION, MILD 03/29/2010  . LEG PAIN, RIGHT 03/29/2010  . Hyperlipidemia 05/19/2008  . Essential hypertension 05/19/2008  . Osteoarthritis 05/19/2008    Past Surgical History:  Procedure Laterality Date  . ABDOMINAL HYSTERECTOMY    . AMPUTATION Left 10/09/2015   Procedure: Left Leg AMPUTATION  BELOW KNEE;  Surgeon: Angelia Mould, MD;  Location: [place] [facility];  Service: Vascular;  Laterality: Left;  . ANKLE FRACTURE SURGERY Left   . APPLICATION OF WOUND VAC Left 11/16/2015   Procedure: APPLICATION OF WOUND VAC LEFT BELOW THE KNEE AMPUTEE.;  Surgeon: Angelia Mould, MD;  Location: [place];  Service: Vascular;  Laterality: Left;  . COLONOSCOPY    . EAR CYST EXCISION Left 04/16/2015   Procedure: EXCISION FACIAL CYST LEFT SIDE ;  Surgeon: Izora Gala, MD;  Location: [place] [facility];  Service: ENT;  Laterality: Left;  . EYE SURGERY  Bilateral    cataract surgery  . I&D EXTREMITY Left 11/16/2015   Procedure: DEBRIDEMENT OF LEFT  BELOW KNEE AMPUTATION ;  Surgeon: Angelia Mould, MD;  Location: [facility];  Service: Vascular;  Laterality: Left;  . PERIPHERAL VASCULAR CATHETERIZATION  08/14/2015   Procedure: Lower Extremity Intervention;  Surgeon: Katha Cabal, MD;  Location: [facility] [facility];  Service: Cardiovascular;;  . PERIPHERAL VASCULAR CATHETERIZATION N/A 08/14/2015   Procedure: Abdominal Aortogram w/Lower Extremity;  Surgeon: Katha Cabal, MD;  Location: [facility];  Service: Cardiovascular;  Laterality: N/A;    OB History    No data available       Home Medications    Prior to Admission medications   Medication Sig Start Date End Date Taking? Authorizing Provider  ALPRAZolam Duanne Moron) 0.5 MG tablet Take 1 tablet (0.5 mg total) by mouth 2 (two) times daily as needed. for anxiety 06/11/16   Tonia Ghent, MD  amLODipine (NORVASC) 5 MG tablet Take 1 tablet (5 mg total) by mouth daily. 06/11/16   Tonia Ghent, MD  Calcium Carbonate-Vitamin D 600-400 MG-UNIT tablet Take 1 tablet by mouth daily. 10/19/15   Lavon Paganini Angiulli, PA-C  diclofenac sodium (VOLTAREN) 1 % GEL Apply 2 g topically 2 (two) times daily as needed (for shoulder pain). 06/11/16   Tonia Ghent, MD  glipiZIDE (GLUCOTROL XL) 2.5 MG 24 hr tablet Take 1 tablet (2.5 mg total) by mouth daily. 06/11/16   Tonia Ghent, MD  glucose blood (ONE TOUCH ULTRA TEST) test strip One touch ultra 2 test strips.  Use daily.  Dx e11.9. 02/05/16   Tonia Ghent, MD  hydrochlorothiazide (MICROZIDE) 12.5 MG capsule Take 1 capsule (12.5 mg total) by mouth daily. 06/11/16   Tonia Ghent, MD  lisinopril (PRINIVIL,ZESTRIL) 5 MG tablet Take 1 tablet (5 mg total) by mouth daily. 06/11/16   Tonia Ghent, MD  ONE TOUCH LANCETS MISC USE TO CHECK BLOOD SUGAR TWO TIMES DAILY.    DX:  E11.9 02/07/16   Tonia Ghent, MD  pantoprazole (PROTONIX) 40  MG tablet Take 1 tablet (40 mg total) by mouth daily. 06/11/16   Tonia Ghent, MD  potassium chloride SA (K-DUR,KLOR-CON) 20 MEQ tablet Take 1 tablet (20 mEq total) by mouth daily. 06/11/16   Tonia Ghent, MD  traMADol (ULTRAM) 50 MG tablet '50mg'$  (1 tab) every 4 to 6 hours as needed for pain.  Sedation caution. 07/21/16   Tonia Ghent, MD  warfarin (COUMADIN) 5 MG tablet Take 1 tablet (5 mg total) by mouth daily. 06/11/16   Tonia Ghent, MD    Family History Family History  Problem Relation Age of Onset  . Arthritis Other     Parents  . Cancer Other     Colon, 1st degree relative <60  . Diabetes Other  Parents  . Stroke Other     1st degree relative <60  . Diabetes Mother   . Hypertension Mother   . Arthritis Mother   . Stroke Mother   . Diabetes Father   . Hypertension Father   . Arthritis Father   . Stroke Father   . Cancer Sister     stomach  . Stroke Brother   . Cancer Brother     prostate <50  . Stroke Daughter   . Stroke Son     Social History Social History  Substance Use Topics  . Smoking status: Former Smoker    Packs/day: 1.00    Years: 13.00    Quit date: 09/01/1973  . Smokeless tobacco: Never Used     Comment: QIUT MANY YEARS AGO  . Alcohol use No     Allergies   Patient has no known allergies.   Review of Systems Review of Systems  Constitutional: Negative for appetite change and fatigue.  HENT: Negative for congestion, ear discharge and sinus pressure.   Eyes: Negative for discharge.  Respiratory: Negative for cough and shortness of breath.   Cardiovascular: Negative for chest pain.  Gastrointestinal: Negative for abdominal pain and diarrhea.  Genitourinary: Negative for frequency and hematuria.  Musculoskeletal: Negative for back pain.  Skin: Negative for rash.  Neurological: Positive for weakness. Negative for focal weakness, seizures and headaches.  Psychiatric/Behavioral: Negative for hallucinations.     Physical  Exam Updated Vital Signs BP 147/68   Pulse 67   Temp 97.4 F (36.3 C) (Oral)   Resp 16   SpO2 100%   Physical Exam  Constitutional: She is oriented to person, place, and time. She appears well-developed.  HENT:  Head: Normocephalic.  Mucous membranes moist.  Eyes: Conjunctivae and EOM are normal. No scleral icterus.  Neck: Neck supple. No thyromegaly present.  Cardiovascular: Normal rate and regular rhythm.  Exam reveals no gallop and no friction rub.   No murmur heard. Pulmonary/Chest: No stridor. She has no wheezes. She has no rales. She exhibits no tenderness.  Abdominal: She exhibits no distension. There is no tenderness. There is no rebound.  Genitourinary: Rectal exam shows guaiac positive stool.  Genitourinary Comments: Rectal exam brown stool heme-positive  Musculoskeletal: Normal range of motion. She exhibits no edema.  Patient has a left above-the-knee amputation  Lymphadenopathy:    She has no cervical adenopathy.  Neurological: She is oriented to person, place, and time. She exhibits normal muscle tone. Coordination normal.  Skin: No rash noted. No erythema.  Psychiatric: She has a normal mood and affect. Her behavior is normal.     ED Treatments / Results  Labs (all labs ordered are listed, but only abnormal results are displayed) Labs Reviewed  CBC WITH DIFFERENTIAL/PLATELET - Abnormal; Notable for the following:       Result Value   RBC 2.59 (*)    Hemoglobin 7.0 (*)    HCT 21.8 (*)    RDW 17.4 (*)    All other components within normal limits  COMPREHENSIVE METABOLIC PANEL - Abnormal; Notable for the following:    Glucose, Bld 127 (*)    BUN 36 (*)    Creatinine, Ser 1.82 (*)    Calcium 14.6 (*)    AST 88 (*)    GFR calc non Af Amer 25 (*)    GFR calc Af Amer 30 (*)    All other components within normal limits  PROTIME-INR - Abnormal; Notable for the following:  Prothrombin Time 38.4 (*)    All other components within normal limits  OCCULT BLOOD  X 1 CARD TO LAB, STOOL  TYPE AND SCREEN    EKG  EKG Interpretation  Date/Time:  Tuesday July 29 2016 11:08:49 EST Ventricular Rate:  69 PR Interval:    QRS Duration: 88 QT Interval:  344 QTC Calculation: 369 R Axis:   39 Text Interpretation:  Sinus rhythm Baseline wander in lead(s) V2 Confirmed by Reginald Weida  MD, Daqwan Dougal [phone]) on 07/29/2016 12:17:49 PM       Radiology Dg Knee Complete 4 Views Right  Result Date: 07/28/2016 CLINICAL DATA:  Right knee pain, fall EXAM: RIGHT KNEE - COMPLETE 4+ VIEW COMPARISON:  03/29/2010 FINDINGS: Four views of the right knee submitted. No acute fracture or subluxation. There is significant narrowing of medial joint compartment. Spurring of medial femoral condyle and medial tibial plateau. Narrowing of patellofemoral joint space. Atherosclerotic calcifications of femoral and popliteal artery. IMPRESSION: No acute fracture or subluxation. Osteoarthritic changes with progression from prior exam. Electronically Signed   By: Lahoma Crocker M.D.   On: 07/28/2016 15:32    Procedures Procedures (including critical care time)  Medications Ordered in ED Medications  sodium chloride 0.9 % bolus 1,000 mL (1,000 mLs Intravenous New Bag/Given 07/29/16 1139)     Initial Impression / Assessment and Plan / ED Course  I have reviewed the triage vital signs and the nursing notes.  Pertinent labs & imaging results that were available during my care of the patient were reviewed by me and considered in my medical decision making (see chart for details).  Clinical Course     Patient with a history of multiple myeloma. Patient with dehydration and hypercalcemia with heme positive stool. She will be admitted to medicine possible consult from oncology  Final Clinical Impressions(s) / ED Diagnoses   Final diagnoses:  Hypercalcemia    New Prescriptions New Prescriptions   No medications on file     Milton Ferguson, MD 07/29/16 1239

## 2016-07-29 NOTE — Plan of Care (Signed)
I told daughter that request was sent to scheduler to get pt back in to see [facility] in the next 2 weeks.

## 2016-07-29 NOTE — Assessment & Plan Note (Signed)
See notes on labs. Incidentally noted.  INR up, will hold coumadin in the meantime.  I will ask for hematology input.   At the OV the patient didn't appear to be emergently ill.  D/w pt and family.  We attributed some of her recent changes/fatigue to pain and sleep deprivation but it would be likely that her anemia and hypercalcemia contribute.  We have been in contact with hematology and will do so again this AM.

## 2016-07-29 NOTE — Telephone Encounter (Signed)
Thanks.  I tried to call the MD line x2 after having onc paged early this AM.  App ER/onc help.

## 2016-07-29 NOTE — Assessment & Plan Note (Addendum)
She is deconditioned and has the previous left lower leg amputation. This is likely led to relative gait instability and the recent falls. Would continue tramadol for pain at this point with sedation caution. See notes on x-rays. No fracture seen but progressive osteoarthritic changes. >25 minutes spent in face to face time with patient, >50% spent in counselling or coordination of care.

## 2016-07-29 NOTE — Assessment & Plan Note (Signed)
See notes on labs. 

## 2016-07-29 NOTE — Telephone Encounter (Signed)
Patient's daughter [name])  left a voicemail stating that she talked with the oncologist office and was told that Dr. [name] can put in an order for short stay to have a blood transfusion and be put into the hospital. [name] also stated that her mom has not seen Dr. Earlie Server since December of last year and she has to re-establish with him.[name] request a call back.

## 2016-07-29 NOTE — Telephone Encounter (Signed)
Verbal order received and read back from Dr. Julien Nordmann for patient to have myeloma lab panel and F/U scheduled to re-stablish care.  Order given to collaborative at this time.  This nurse called patient's daughter notifying her they will be contacted with appointment to re-establish care.  Report to PCP for further orders or instructions.

## 2016-07-29 NOTE — Telephone Encounter (Signed)
requesting appt to reestablish-message sent to scheduler.

## 2016-07-29 NOTE — Telephone Encounter (Signed)
[name] (daughter) advised.  Patient is in ER at [facility] now.

## 2016-07-29 NOTE — Telephone Encounter (Signed)
Thanks.  I called charge RN at [facility] in the meantime to explain recent events.  App help of all involved.

## 2016-07-29 NOTE — Progress Notes (Signed)
ANTICOAGULATION CONSULT NOTE - Initial Consult  Pharmacy Consult for Warfarin Indication: DVT  No Known Allergies  Patient Measurements:     Vital Signs: Temp: 97.4 F (36.3 C) (11/28 1027) Temp Source: Oral (11/28 1027) BP: 145/64 (11/28 1355) Pulse Rate: 82 (11/28 1355)  Labs:  Recent Labs  07/28/16 1356 07/29/16 1054  HGB 6.9 Repeated and verified X2.* 7.0*  HCT 21.3 Repeated and verified X2.* 21.8*  PLT 217.0 220  LABPROT 51.5* 38.4*  INR 4.7* 3.80  CREATININE 1.55* 1.82*    CrCl cannot be calculated (Unknown ideal weight.).   Medical History: Past Medical History:  Diagnosis Date  . Anemia   . Anxiety    takes Xanax daily as needed  . Blood transfusion   . Cardiomyopathy ([place])   . Deep vein thrombosis of bilateral lower extremities (HCC)   . Degenerative joint disease   . Depression    Mild  . Diabetes mellitus    takes Glipizide daily  . Family history of anesthesia complication    DAUGHTER CPR AFTER  . GERD (gastroesophageal reflux disease)    takes Protonix daily  . Heart murmur   . History of chicken pox   . History of radiation therapy 08/30/13-09/20/13   20 gray to lower lumbar/upper sacrum  . History of shingles   . Hyperlipidemia   . Hypertension    takes Amlodipine,HCTZ,and Lisinopril daily  . Multiple myeloma    per Dr. Julien Nordmann, s/p palliative radiation for leg pain 2011 per Dr. Sondra Come  . Phlebitis   . UTI (urinary tract infection) 09/08/2013    Assessment:  80 year female with PMH significant for multiple myeloma, DM, and bilateral DVT (on warfarin PTA).  Presents with c/o weakness and decreased eating/drinking over the past few days.  Sent to ED from PCP with noted elevated calcium and protime.  To be admitted for hypercalcemia, anemia.  Pharmacy consulted to manage warfarin therapy  PTA warfarin regimen = 38m daily with last dose reported as taken on 07/28/16 @ 17:30  INR upon admission (07/28/16) = 3.8  Goal of Therapy:   INR 2-3   Plan:   No warfarin tonight due to elevated INR  Check daily PT/INR and resume warfarin when INR < 3  Brandonlee Navis, LToribio Harbour PharmD 07/29/2016,3:07 PM

## 2016-07-29 NOTE — Telephone Encounter (Signed)
Second call received from [place].  "PCP called to notify us her calcium level is high and needs to be rechecked at [facility].  Where do we need to go.  Advised to clarify with PCP.  Will also notify Dr. Julien Nordmann.  Yesterday calcium = 14.4 per EPIC lab section.

## 2016-07-30 ENCOUNTER — Observation Stay (HOSPITAL_COMMUNITY): Payer: Medicare Other

## 2016-07-30 ENCOUNTER — Encounter (HOSPITAL_COMMUNITY): Payer: Self-pay

## 2016-07-30 DIAGNOSIS — I10 Essential (primary) hypertension: Secondary | ICD-10-CM

## 2016-07-30 DIAGNOSIS — Z7901 Long term (current) use of anticoagulants: Secondary | ICD-10-CM

## 2016-07-30 DIAGNOSIS — I825Z2 Chronic embolism and thrombosis of unspecified deep veins of left distal lower extremity: Secondary | ICD-10-CM

## 2016-07-30 DIAGNOSIS — E119 Type 2 diabetes mellitus without complications: Secondary | ICD-10-CM

## 2016-07-30 DIAGNOSIS — C9000 Multiple myeloma not having achieved remission: Secondary | ICD-10-CM

## 2016-07-30 DIAGNOSIS — Z86718 Personal history of other venous thrombosis and embolism: Secondary | ICD-10-CM | POA: Diagnosis not present

## 2016-07-30 DIAGNOSIS — R410 Disorientation, unspecified: Secondary | ICD-10-CM | POA: Diagnosis not present

## 2016-07-30 LAB — MAGNESIUM: Magnesium: 1.3 mg/dL — ABNORMAL LOW (ref 1.7–2.4)

## 2016-07-30 LAB — BASIC METABOLIC PANEL
ANION GAP: 8 (ref 5–15)
BUN: 24 mg/dL — ABNORMAL HIGH (ref 6–20)
CALCIUM: 12.7 mg/dL — AB (ref 8.9–10.3)
CO2: 23 mmol/L (ref 22–32)
CREATININE: 1.32 mg/dL — AB (ref 0.44–1.00)
Chloride: 111 mmol/L (ref 101–111)
GFR, EST AFRICAN AMERICAN: 44 mL/min — AB (ref >60)
GFR, EST NON AFRICAN AMERICAN: 38 mL/min — AB (ref >60)
Glucose, Bld: 103 mg/dL — ABNORMAL HIGH (ref 65–99)
Potassium: 3.3 mmol/L — ABNORMAL LOW (ref 3.5–5.1)
SODIUM: 142 mmol/L (ref 135–145)

## 2016-07-30 LAB — TYPE AND SCREEN
ABO/RH(D): B POS
ANTIBODY SCREEN: NEGATIVE
UNIT DIVISION: 0

## 2016-07-30 LAB — URINALYSIS, ROUTINE W REFLEX MICROSCOPIC
Bilirubin Urine: NEGATIVE
GLUCOSE, UA: NEGATIVE mg/dL
KETONES UR: NEGATIVE mg/dL
LEUKOCYTES UA: NEGATIVE
NITRITE: NEGATIVE
PH: 6.5 (ref 5.0–8.0)
Protein, ur: NEGATIVE mg/dL
SPECIFIC GRAVITY, URINE: 1.008 (ref 1.005–1.030)

## 2016-07-30 LAB — PROTIME-INR
INR: 3.2
PROTHROMBIN TIME: 33.5 s — AB (ref 11.4–15.2)

## 2016-07-30 LAB — GLUCOSE, CAPILLARY
GLUCOSE-CAPILLARY: 124 mg/dL — AB (ref 65–99)
GLUCOSE-CAPILLARY: 104 mg/dL — AB (ref 65–99)
Glucose-Capillary: 96 mg/dL (ref 65–99)
Glucose-Capillary: 132 mg/dL — ABNORMAL HIGH (ref 65–99)

## 2016-07-30 LAB — URINE MICROSCOPIC-ADD ON
BACTERIA UA: NONE SEEN
WBC UA: NONE SEEN WBC/hpf (ref 0–5)

## 2016-07-30 LAB — IRON AND TIBC
IRON: 58 ug/dL (ref 28–170)
SATURATION RATIOS: 18 % (ref 10.4–31.8)
TIBC: 328 ug/dL (ref 250–450)
UIBC: 270 ug/dL

## 2016-07-30 LAB — RETICULOCYTES
RBC.: 3.18 MIL/uL — ABNORMAL LOW (ref 3.87–5.11)
Retic Count, Absolute: 44.5 10*3/uL (ref 19.0–186.0)
Retic Ct Pct: 1.4 % (ref 0.4–3.1)

## 2016-07-30 LAB — CBC
HCT: 26 % — ABNORMAL LOW (ref 36.0–46.0)
Hemoglobin: 8.6 g/dL — ABNORMAL LOW (ref 12.0–15.0)
MCH: 27.3 pg (ref 26.0–34.0)
MCHC: 33.1 g/dL (ref 30.0–36.0)
MCV: 82.5 fL (ref 78.0–100.0)
Platelets: 188 10*3/uL (ref 150–400)
RBC: 3.15 MIL/uL — ABNORMAL LOW (ref 3.87–5.11)
RDW: 16.8 % — AB (ref 11.5–15.5)
WBC: 7 10*3/uL (ref 4.0–10.5)

## 2016-07-30 LAB — VITAMIN B12: Vitamin B-12: 267 pg/mL (ref 180–914)

## 2016-07-30 LAB — FERRITIN: FERRITIN: 353 ng/mL — AB (ref 11–307)

## 2016-07-30 LAB — PHOSPHORUS: PHOSPHORUS: 2.3 mg/dL — AB (ref 2.5–4.6)

## 2016-07-30 LAB — FOLATE: FOLATE: 10.6 ng/mL (ref >5.9)

## 2016-07-30 MED ORDER — POTASSIUM PHOSPHATES 15 MMOLE/5ML IV SOLN
20.0000 mmol | Freq: Once | INTRAVENOUS | Status: AC
  Administered 2016-07-30: 20 mmol via INTRAVENOUS
  Filled 2016-07-30: qty 6.67

## 2016-07-30 MED ORDER — GUAIFENESIN-DM 100-10 MG/5ML PO SYRP
5.0000 mL | ORAL_SOLUTION | ORAL | Status: DC | PRN
  Administered 2016-07-30 – 2016-08-02 (×3): 5 mL via ORAL
  Filled 2016-07-30 (×3): qty 10

## 2016-07-30 MED ORDER — POTASSIUM CHLORIDE 20 MEQ/15ML (10%) PO SOLN
40.0000 meq | Freq: Once | ORAL | Status: AC
  Administered 2016-07-30: 40 meq via ORAL
  Filled 2016-07-30 (×2): qty 30

## 2016-07-30 MED ORDER — DIPHENHYDRAMINE HCL 12.5 MG/5ML PO ELIX
12.5000 mg | ORAL_SOLUTION | ORAL | Status: AC | PRN
  Administered 2016-07-30 – 2016-08-01 (×2): 12.5 mg via ORAL
  Filled 2016-07-30 (×2): qty 5

## 2016-07-30 MED ORDER — MAGNESIUM SULFATE 4 GM/100ML IV SOLN
4.0000 g | Freq: Once | INTRAVENOUS | Status: AC
  Administered 2016-07-30: 4 g via INTRAVENOUS
  Filled 2016-07-30: qty 100

## 2016-07-30 NOTE — Evaluation (Signed)
Physical Therapy Evaluation Patient Details Name: [name] MRN: [id number] DOB: 11-03-1935 Today's Date: 07/30/2016   History of Present Illness  Pt admitted with confusion, decreased HgB, and elevated Ca+.  Pt with hx of DM and L BKA (10/09/15)  Clinical Impression  Pt admitted as above and presenting with functional mobility limitations 2* generalized weakness, poor endurance and ambulatory balance deficits.  Pt should progress to dc home with family assist and would benefit from follow up HHPT to further address deficits.    Follow Up Recommendations Home health PT    Equipment Recommendations  None recommended by PT    Recommendations for Other Services       Precautions / Restrictions Precautions Precautions: Fall Required Braces or Orthoses: Other Brace/Splint Other Brace/Splint: Prosthesis in room Restrictions Weight Bearing Restrictions: No      Mobility  Bed Mobility Overal bed mobility: Needs Assistance Bed Mobility: Sit to Supine       Sit to supine: Min guard   General bed mobility comments: Increased time and min guard for saftey  Transfers Overall transfer level: Needs assistance Equipment used: Rolling walker (2 wheeled) Transfers: Sit to/from Stand Sit to Stand: Min assist;Min guard         General transfer comment: min cues for transition position and saftey awareness  Ambulation/Gait Ambulation/Gait assistance: Min guard Ambulation Distance (Feet): 40 Feet Assistive device: Rolling walker (2 wheeled) Gait Pattern/deviations: Step-to pattern;Step-through pattern;Decreased step length - right;Decreased step length - left;Shuffle;Trunk flexed Gait velocity: decr Gait velocity interpretation: Below normal speed for age/gender General Gait Details: cues for posture and position from [employer]            Wheelchair Mobility    Modified Rankin (Stroke Patients Only)       Balance Overall balance assessment: Needs  assistance Sitting-balance support: Feet supported Sitting balance-Leahy Scale: Good     Standing balance support: Bilateral upper extremity supported Standing balance-Leahy Scale: Fair                               Pertinent Vitals/Pain Pain Assessment: No/denies pain    Home Living Family/patient expects to be discharged to:: Private residence Living Arrangements: Children Available Help at Discharge: Family;Available 24 hours/day;Personal care attendant Type of Home: House Home Access: Ramped entrance     Home Layout: One level Home Equipment: [place] - 2 wheels;Bedside commode;Wheelchair - manual Additional Comments: son is w/c bound, daughter [name] is looking into 24 hour care at home    Prior Function Level of Independence: Needs assistance   Gait / Transfers Assistance Needed: Walking household distances with RW.  ADL's / Homemaking Assistance Needed: Sponge bathed, dressed, self fed, toileted modified independently. Performed light housekeeping and laundry, Son did houskeeping  Comments: family helped with medications     Hand Dominance   Dominant Hand: Right    Extremity/Trunk Assessment   Upper Extremity Assessment: Generalized weakness           Lower Extremity Assessment: Generalized weakness      Cervical / Trunk Assessment: Kyphotic  Communication   Communication: HOH  Cognition Arousal/Alertness: Awake/alert Behavior During Therapy: Impulsive Overall Cognitive Status: History of cognitive impairments - at baseline                      General Comments      Exercises     Assessment/Plan    PT Assessment Patient  needs continued PT services  PT Problem List Decreased strength;Decreased activity tolerance;Decreased balance;Decreased mobility;Decreased knowledge of use of DME          PT Treatment Interventions DME instruction;Gait training;Functional mobility training;Therapeutic activities;Therapeutic  exercise;Patient/family education    PT Goals (Current goals can be found in the Care Plan section)  Acute Rehab PT Goals Patient Stated Goal: Get back to bed PT Goal Formulation: With patient/family Time For Goal Achievement: 08/13/16 Potential to Achieve Goals: Good    Frequency Min 3X/week   Barriers to discharge        Co-evaluation               End of Session Equipment Utilized During Treatment: Gait belt Activity Tolerance: Patient limited by fatigue Patient left: in bed;with call bell/phone within reach Nurse Communication: Mobility status    Functional Assessment Tool Used: Clinical judgement Functional Limitation: Mobility: Walking and moving around Mobility: Walking and Moving Around Current Status (M7615): At least 1 percent but less than 20 percent impaired, limited or restricted Mobility: Walking and Moving Around Goal Status [phone]): At least 1 percent but less than 20 percent impaired, limited or restricted    Time: 1108-1140 PT Time Calculation (min) (ACUTE ONLY): 32 min   Charges:   PT Evaluation $PT Eval Low Complexity: 1 Procedure PT Treatments $Gait Training: 8-22 mins   PT G Codes:   PT G-Codes **NOT FOR INPATIENT CLASS** Functional Assessment Tool Used: Clinical judgement Functional Limitation: Mobility: Walking and moving around Mobility: Walking and Moving Around Current Status (B3578): At least 1 percent but less than 20 percent impaired, limited or restricted Mobility: Walking and Moving Around Goal Status [phone]): At least 1 percent but less than 20 percent impaired, limited or restricted    Blu Mcglaun 07/30/2016, 12:40 PM

## 2016-07-30 NOTE — Progress Notes (Signed)
PROGRESS NOTE    [name]  ZOX:[id number] DOB: [birth year] DOA: 07/29/2016 PCP: [name], MD    Brief Narrative:  Patient is a 80 year old female history of multiple myeloma loss of follow-up, hypertension, diabetes, anxiety, status post left BKA presented to the ED with generalized weakness and confusion. Hemoglobin on admission was noted to be 7.0. Patient was also noted to be hypercalcemic with a calcium level of 14.6.   Assessment & Plan:   Principal Problem:   Hypercalcemia Active Problems:   Multiple myeloma (HCC)   Diabetes mellitus without complication (HCC)   Essential hypertension   Deep vein thrombosis of left lower extremity (HCC)   Deep vein thrombosis of right lower extremity ([place])   Long term current use of anticoagulant therapy   History of DVT (deep vein thrombosis)  #1 hypercalcemia Questionable etiology. May be secondary to multiple myeloma. Patient has not been compliant with follow-ups with her multiple myeloma. Patient also on oral calcium supplementations at home. Phosphorus level is low. Magnesium level is low. Replete electrolytes. Check a intact calcium. Check a PTH, rPTH. Calcium levels trending down. Continue IV fluids. Oncology has been consulted and awaiting consultation. Follow.  #2 multiple myeloma Oncology has been consulted.  #3 hypokalemia/hypomagnesemia/hypophosphatemia Replete electrolytes.  #4 anemia Likely anemia of chronic disease versus secondary to multiple myeloma. Patient with no overt bleeding. Patient status post 1 unit packed red blood cells. Hemoglobin currently at 8.6. Follow H&H. Oncology consultation pending.  #5 history of DVT On chronic anticoagulation. INR supratherapeutic. Coumadin per pharmacy.  #6 diabetes mellitus Hemoglobin A1c 6.2 on 06/11/2016. CBGs have ranged from 96-132. Oral hypoglycemic agents on hold. Continue sliding scale insulin.   #7 acute encephalopathy  Likely secondary to dehydration and  hypercalcemia. Clinical improvement with hydration with IV fluids and improvement of calcium levels. CT head negative. Check a UA with cultures and sensitivities. Continue IV fluids. Follow.   DVT prophylaxis: Coumadin Code Status: Full Family Communication: Updated patient and daughter at bedside. Disposition Plan: Home when medically stable.   Consultants:   Oncology pending  Procedures:   CT head without contrast 07/30/2016  Plain films of the right knee 07/28/2016  1 unit packed red blood cells 07/29/2016  Antimicrobials:   None   Subjective: Patient sitting up in chair. Alert to self place and time. Patient asking whether she can go home today. Patient denies any shortness of breath. No chest pain. Patient denies any overt bleeding.  Objective: Vitals:   07/29/16 1613 07/29/16 1628 07/29/16 1945 07/30/16 0425  BP: (!) 123/48 (!) 109/59 (!) 135/59 (!) 144/64  Pulse: 73 71 71 73  Resp: 18   20  Temp: 98.6 F (37 C) 98.6 F (37 C) 99 F (37.2 C) 98.9 F (37.2 C)  TempSrc: Oral Oral Oral Oral  SpO2: 100% 100% 100% 100%    Intake/Output Summary (Last 24 hours) at 07/30/16 1100 Last data filed at 07/30/16 8119  Gross per 24 hour  Intake          2645.67 ml  Output             1000 ml  Net          1645.67 ml   There were no vitals filed for this visit.  Examination:  General exam: Appears calm and comfortable  Respiratory system: Clear to auscultation. Respiratory effort normal. Cardiovascular system: S1 & S2 heard, RRR. No JVD, murmurs, rubs, gallops or clicks. No pedal edema. Gastrointestinal system: Abdomen is  nondistended, soft and nontender. No organomegaly or masses felt. Normal bowel sounds heard. Central nervous system: Alert and oriented. No focal neurological deficits. Extremities: Status post left BKA. Skin: No rashes, lesions or ulcers Psychiatry: Judgement and insight appear normal. Mood & affect appropriate.     Data Reviewed: I have  personally reviewed following labs and imaging studies  CBC:  Recent Labs Lab 07/28/16 1356 07/29/16 1054 07/30/16 0532  WBC 6.6 7.1 7.0  NEUTROABS 4.2 4.6  --   HGB 6.9 Repeated and verified X2.* 7.0* 8.6*  HCT 21.3 Repeated and verified X2.* 21.8* 26.0*  MCV 82.8 84.2 82.5  PLT 217.0 220 917   Basic Metabolic Panel:  Recent Labs Lab 07/28/16 1356 07/29/16 1054 07/30/16 0532 07/30/16 0930  NA 143 140 142  --   K 3.9 3.7 3.3*  --   CL 107 107 111  --   CO2 _0 --   GLUCOSE 69* 127* 103*  --   BUN 38* 36* 24*  --   CREATININE 1.55* 1.82* 1.32*  --   CALCIUM 14.4* 14.6* 12.7*  --   MG  --   --   --  1.3*  PHOS  --   --   --  2.3*   GFR: CrCl cannot be calculated (Unknown ideal weight.). Liver Function Tests:  Recent Labs Lab 07/28/16 1356 07/29/16 1054  AST 80* 88*  ALT 19 20  ALKPHOS 64 66  BILITOT 0.2 0.4  PROT 7.0 7.1  ALBUMIN 4.1 4.2   No results for input(s): LIPASE, AMYLASE in the last 168 hours. No results for input(s): AMMONIA in the last 168 hours. Coagulation Profile:  Recent Labs Lab 07/28/16 1356 07/29/16 1054 07/30/16 0532  INR 4.7* 3.80 3.20   Cardiac Enzymes: No results for input(s): CKTOTAL, CKMB, CKMBINDEX, TROPONINI in the last 168 hours. BNP (last 3 results) No results for input(s): PROBNP in the last 8760 hours. HbA1C: No results for input(s): HGBA1C in the last 72 hours. CBG:  Recent Labs Lab 07/29/16 1433 07/29/16 1740 07/29/16 2128 07/30/16 0719  GLUCAP 95 137* 101* 124*   Lipid Profile: No results for input(s): CHOL, HDL, LDLCALC, TRIG, CHOLHDL, LDLDIRECT in the last 72 hours. Thyroid Function Tests: No results for input(s): TSH, T4TOTAL, FREET4, T3FREE, THYROIDAB in the last 72 hours. Anemia Panel:  Recent Labs  07/30/16 0930  RETICCTPCT 1.4   Sepsis Labs: No results for input(s): PROCALCITON, LATICACIDVEN in the last 168 hours.  No results found for this or any previous visit (from the past 240  hour(s)).       Radiology Studies: Dg Knee Complete 4 Views Right  Result Date: 07/28/2016 CLINICAL DATA:  Right knee pain, fall EXAM: RIGHT KNEE - COMPLETE 4+ VIEW COMPARISON:  03/29/2010 FINDINGS: Four views of the right knee submitted. No acute fracture or subluxation. There is significant narrowing of medial joint compartment. Spurring of medial femoral condyle and medial tibial plateau. Narrowing of patellofemoral joint space. Atherosclerotic calcifications of femoral and popliteal artery. IMPRESSION: No acute fracture or subluxation. Osteoarthritic changes with progression from prior exam. Electronically Signed   By: Lahoma Crocker M.D.   On: 07/28/2016 15:32        Scheduled Meds: . insulin aspart  0-15 Units Subcutaneous TID WC  . insulin aspart  0-5 Units Subcutaneous QHS  . magnesium sulfate 1 - 4 g bolus IVPB  4 g Intravenous Once  . pantoprazole  40 mg Oral Daily  . potassium phosphate IVPB (  mmol)  20 mmol Intravenous Once  . sodium chloride flush  3 mL Intravenous Q12H  . Warfarin - Pharmacist Dosing Inpatient   Does not apply q1800   Continuous Infusions: . sodium chloride 125 mL/hr at 07/29/16 1628     LOS: 0 days    Time spent: 30 minutes    THOMPSON,DANIEL, MD [facility] Pager [phone]  If 7PM-7AM, please contact night-coverage [email or web address] Password [email or web address] 07/30/2016, 11:00 AM

## 2016-07-30 NOTE — Progress Notes (Addendum)
ANTICOAGULATION CONSULT NOTE - Follow-Up Consult  Pharmacy Consult for Warfarin Indication: history of DVT  No Known Allergies  Patient Measurements:   Height: 5'2" Weight (as of 06/29/16): 47.6 kg  Vital Signs: Temp: 98.9 F (37.2 C) (11/29 0425) Temp Source: Oral (11/29 0425) BP: 144/64 (11/29 0425) Pulse Rate: 73 (11/29 0425)  Labs:  Recent Labs  07/28/16 1356 07/29/16 1054 07/30/16 0532  HGB 6.9 Repeated and verified X2.* 7.0* 8.6*  HCT 21.3 Repeated and verified X2.* 21.8* 26.0*  PLT 217.0 220 188  LABPROT 51.5* 38.4* 33.5*  INR 4.7* 3.80 3.20  CREATININE 1.55* 1.82* 1.32*    CrCl cannot be calculated (Unknown ideal weight.).   Medical History: Past Medical History:  Diagnosis Date  . Anemia   . Anxiety    takes Xanax daily as needed  . Blood transfusion   . Cardiomyopathy ([place])   . Deep vein thrombosis of bilateral lower extremities (HCC)   . Degenerative joint disease   . Depression    Mild  . Diabetes mellitus    takes Glipizide daily  . Family history of anesthesia complication    DAUGHTER CPR AFTER  . GERD (gastroesophageal reflux disease)    takes Protonix daily  . Heart murmur   . History of chicken pox   . History of radiation therapy 08/30/13-09/20/13   20 gray to lower lumbar/upper sacrum  . History of shingles   . Hyperlipidemia   . Hypertension    takes Amlodipine,HCTZ,and Lisinopril daily  . Multiple myeloma    per Dr. Julien Nordmann, s/p palliative radiation for leg pain 2011 per Dr. Sondra Come  . Phlebitis   . UTI (urinary tract infection) 09/08/2013    Assessment:  80 year female with PMH significant for multiple myeloma, DM, and bilateral DVT (on warfarin PTA).  Presents with c/o weakness and decreased eating/drinking over the past few days.  Sent to ED from PCP with noted elevated calcium and protime.  To be admitted for hypercalcemia, anemia.  Pharmacy consulted to manage warfarin therapy  PTA warfarin regimen = '5mg'$  daily with  last dose reported as taken on 07/28/16 @ 17:30  INR upon admission (07/28/16) = 3.8  Today, 07/30/16:  INR trending down, but remains elevated at 3.2  CBC: Hgb improved to 8.6 s/p 1 unit PRBCs, Pltc WNL  Diet: Carb modified, 100% of breakfast documented  CT of head ordered today due to confusion  Goal of Therapy:  INR 2-3   Plan:   No warfarin tonight due to supratherapeutic INR  Daily PT/INR  Monitor closely for s/sx of bleeding   Lindell Spar, PharmD, BCPS Pager: [phone] 07/30/2016 12:27 PM

## 2016-07-31 ENCOUNTER — Telehealth: Payer: Self-pay | Admitting: Internal Medicine

## 2016-07-31 DIAGNOSIS — Z7984 Long term (current) use of oral hypoglycemic drugs: Secondary | ICD-10-CM | POA: Diagnosis not present

## 2016-07-31 DIAGNOSIS — K219 Gastro-esophageal reflux disease without esophagitis: Secondary | ICD-10-CM | POA: Diagnosis present

## 2016-07-31 DIAGNOSIS — Z823 Family history of stroke: Secondary | ICD-10-CM | POA: Diagnosis not present

## 2016-07-31 DIAGNOSIS — G9341 Metabolic encephalopathy: Secondary | ICD-10-CM | POA: Diagnosis present

## 2016-07-31 DIAGNOSIS — Z86718 Personal history of other venous thrombosis and embolism: Secondary | ICD-10-CM | POA: Diagnosis not present

## 2016-07-31 DIAGNOSIS — Z79899 Other long term (current) drug therapy: Secondary | ICD-10-CM | POA: Diagnosis not present

## 2016-07-31 DIAGNOSIS — I10 Essential (primary) hypertension: Secondary | ICD-10-CM | POA: Diagnosis not present

## 2016-07-31 DIAGNOSIS — I825Z2 Chronic embolism and thrombosis of unspecified deep veins of left distal lower extremity: Secondary | ICD-10-CM | POA: Diagnosis not present

## 2016-07-31 DIAGNOSIS — Z8249 Family history of ischemic heart disease and other diseases of the circulatory system: Secondary | ICD-10-CM | POA: Diagnosis not present

## 2016-07-31 DIAGNOSIS — E860 Dehydration: Secondary | ICD-10-CM | POA: Diagnosis present

## 2016-07-31 DIAGNOSIS — Z9119 Patient's noncompliance with other medical treatment and regimen: Secondary | ICD-10-CM | POA: Diagnosis not present

## 2016-07-31 DIAGNOSIS — Z7901 Long term (current) use of anticoagulants: Secondary | ICD-10-CM | POA: Diagnosis not present

## 2016-07-31 DIAGNOSIS — Z8261 Family history of arthritis: Secondary | ICD-10-CM | POA: Diagnosis not present

## 2016-07-31 DIAGNOSIS — C9000 Multiple myeloma not having achieved remission: Secondary | ICD-10-CM | POA: Diagnosis present

## 2016-07-31 DIAGNOSIS — E119 Type 2 diabetes mellitus without complications: Secondary | ICD-10-CM | POA: Diagnosis not present

## 2016-07-31 DIAGNOSIS — Z89612 Acquired absence of left leg above knee: Secondary | ICD-10-CM | POA: Diagnosis not present

## 2016-07-31 DIAGNOSIS — Z809 Family history of malignant neoplasm, unspecified: Secondary | ICD-10-CM | POA: Diagnosis not present

## 2016-07-31 DIAGNOSIS — E876 Hypokalemia: Secondary | ICD-10-CM | POA: Diagnosis present

## 2016-07-31 DIAGNOSIS — Z923 Personal history of irradiation: Secondary | ICD-10-CM | POA: Diagnosis not present

## 2016-07-31 DIAGNOSIS — E785 Hyperlipidemia, unspecified: Secondary | ICD-10-CM | POA: Diagnosis present

## 2016-07-31 DIAGNOSIS — D630 Anemia in neoplastic disease: Secondary | ICD-10-CM | POA: Diagnosis present

## 2016-07-31 DIAGNOSIS — Z833 Family history of diabetes mellitus: Secondary | ICD-10-CM | POA: Diagnosis not present

## 2016-07-31 DIAGNOSIS — Z87891 Personal history of nicotine dependence: Secondary | ICD-10-CM | POA: Diagnosis not present

## 2016-07-31 LAB — CALCITRIOL (1,25 DI-OH VIT D): VIT D 1 25 DIHYDROXY: 14.7 pg/mL — AB (ref 19.9–79.3)

## 2016-07-31 LAB — CBC
HCT: 26.3 % — ABNORMAL LOW (ref 36.0–46.0)
HEMOGLOBIN: 8.8 g/dL — AB (ref 12.0–15.0)
MCH: 27.7 pg (ref 26.0–34.0)
MCHC: 33.5 g/dL (ref 30.0–36.0)
MCV: 82.7 fL (ref 78.0–100.0)
PLATELETS: 172 10*3/uL (ref 150–400)
RBC: 3.18 MIL/uL — AB (ref 3.87–5.11)
RDW: 16.9 % — ABNORMAL HIGH (ref 11.5–15.5)
WBC: 7.3 10*3/uL (ref 4.0–10.5)

## 2016-07-31 LAB — BASIC METABOLIC PANEL
Anion gap: 8 (ref 5–15)
BUN: 13 mg/dL (ref 6–20)
CHLORIDE: 114 mmol/L — AB (ref 101–111)
CO2: 21 mmol/L — ABNORMAL LOW (ref 22–32)
CREATININE: 1.19 mg/dL — AB (ref 0.44–1.00)
Calcium: 11.6 mg/dL — ABNORMAL HIGH (ref 8.9–10.3)
GFR, EST AFRICAN AMERICAN: 49 mL/min — AB (ref >60)
GFR, EST NON AFRICAN AMERICAN: 43 mL/min — AB (ref >60)
Glucose, Bld: 104 mg/dL — ABNORMAL HIGH (ref 65–99)
Potassium: 3.4 mmol/L — ABNORMAL LOW (ref 3.5–5.1)
SODIUM: 143 mmol/L (ref 135–145)

## 2016-07-31 LAB — PROTIME-INR
INR: 2.83
Prothrombin Time: 30.3 seconds — ABNORMAL HIGH (ref 11.4–15.2)

## 2016-07-31 LAB — URINE CULTURE: Culture: <10000 — AB

## 2016-07-31 LAB — GLUCOSE, CAPILLARY
GLUCOSE-CAPILLARY: 104 mg/dL — AB (ref 65–99)
GLUCOSE-CAPILLARY: 122 mg/dL — AB (ref 65–99)
GLUCOSE-CAPILLARY: 116 mg/dL — AB (ref 65–99)
Glucose-Capillary: 97 mg/dL (ref 65–99)

## 2016-07-31 LAB — PTH, INTACT AND CALCIUM
CALCIUM TOTAL (PTH): 12.3 mg/dL — AB (ref 8.7–10.3)
PTH: 10 pg/mL — ABNORMAL LOW (ref 15–65)

## 2016-07-31 LAB — VITAMIN D 25 HYDROXY (VIT D DEFICIENCY, FRACTURES): VIT D 25 HYDROXY: 42.2 ng/mL (ref 30.0–100.0)

## 2016-07-31 LAB — MAGNESIUM: MAGNESIUM: 2.2 mg/dL (ref 1.7–2.4)

## 2016-07-31 LAB — PHOSPHORUS: PHOSPHORUS: 3.6 mg/dL (ref 2.5–4.6)

## 2016-07-31 MED ORDER — LISINOPRIL 5 MG PO TABS
5.0000 mg | ORAL_TABLET | Freq: Every day | ORAL | Status: DC
  Administered 2016-07-31 – 2016-08-03 (×4): 5 mg via ORAL
  Filled 2016-07-31 (×4): qty 1

## 2016-07-31 MED ORDER — SODIUM CHLORIDE 0.45 % IV SOLN
INTRAVENOUS | Status: DC
  Administered 2016-07-31 – 2016-08-03 (×5): via INTRAVENOUS

## 2016-07-31 MED ORDER — WARFARIN SODIUM 3 MG PO TABS
3.0000 mg | ORAL_TABLET | Freq: Once | ORAL | Status: AC
  Administered 2016-07-31: 3 mg via ORAL
  Filled 2016-07-31: qty 1

## 2016-07-31 MED ORDER — AMLODIPINE BESYLATE 5 MG PO TABS
5.0000 mg | ORAL_TABLET | Freq: Every day | ORAL | Status: DC
  Administered 2016-07-31 – 2016-08-03 (×4): 5 mg via ORAL
  Filled 2016-07-31 (×4): qty 1

## 2016-07-31 MED ORDER — FUROSEMIDE 10 MG/ML IJ SOLN
40.0000 mg | Freq: Every day | INTRAMUSCULAR | Status: DC
  Administered 2016-08-01: 40 mg via INTRAVENOUS
  Filled 2016-07-31: qty 4

## 2016-07-31 MED ORDER — LOPERAMIDE HCL 2 MG PO CAPS: 2.0000 mg | ORAL_CAPSULE | ORAL | Status: DC | PRN

## 2016-07-31 MED ORDER — FUROSEMIDE 10 MG/ML IJ SOLN
20.0000 mg | Freq: Once | INTRAMUSCULAR | Status: AC
  Administered 2016-07-31: 20 mg via INTRAVENOUS
  Filled 2016-07-31: qty 2

## 2016-07-31 MED ORDER — LOPERAMIDE HCL 2 MG PO CAPS
4.0000 mg | ORAL_CAPSULE | Freq: Once | ORAL | Status: AC
  Administered 2016-07-31: 4 mg via ORAL
  Filled 2016-07-31: qty 2

## 2016-07-31 MED ORDER — POTASSIUM CHLORIDE CRYS ER 20 MEQ PO TBCR
40.0000 meq | EXTENDED_RELEASE_TABLET | Freq: Once | ORAL | Status: AC
  Administered 2016-07-31: 40 meq via ORAL
  Filled 2016-07-31: qty 2

## 2016-07-31 MED ORDER — SODIUM CHLORIDE 0.45 % IV SOLN: INTRAVENOUS | Status: DC

## 2016-07-31 MED ORDER — POTASSIUM CHLORIDE CRYS ER 20 MEQ PO TBCR
20.0000 meq | EXTENDED_RELEASE_TABLET | Freq: Every day | ORAL | Status: DC
  Administered 2016-07-31 – 2016-08-03 (×4): 20 meq via ORAL
  Filled 2016-07-31 (×4): qty 1

## 2016-07-31 NOTE — Care Management Obs Status (Signed)
[place] NOTIFICATION   Patient Details  Name: ARIEONA SWAGGERTY MRN: [id number] Date of Birth: 1936-07-29   Medicare Observation Status Notification Given:       Leeroy Cha, RN 07/31/2016, 3:08 PM

## 2016-07-31 NOTE — Progress Notes (Signed)
PROGRESS NOTE    [name]  TGG:[id number] DOB: 1936-03-22 DOA: 07/29/2016 PCP: [name], MD    Brief Narrative:  Patient is a 80 year old female history of multiple myeloma loss of follow-up, hypertension, diabetes, anxiety, status post left BKA presented to the ED with generalized weakness and confusion. Hemoglobin on admission was noted to be 7.0. Patient was also noted to be hypercalcemic with a calcium level of 14.6.   Assessment & Plan:   Principal Problem:   Hypercalcemia Active Problems:   Multiple myeloma (HCC)   Diabetes mellitus without complication (HCC)   Essential hypertension   Deep vein thrombosis of left lower extremity (HCC)   Deep vein thrombosis of right lower extremity ([place])   Long term current use of anticoagulant therapy   History of DVT (deep vein thrombosis)  #1 hypercalcemia Questionable etiology. May be secondary to multiple myeloma. Patient has not been compliant with follow-ups with her multiple myeloma. Patient also on oral calcium supplementations at home. Phosphorus level is low. Magnesium level is low. Intact PTH low. Replete electrolytes. Intact calcium pending. rPTH pending. Calcium levels trending down. Continue IV fluids. Place on IV lasix '40mg'$  daily. Oncology has been consulted and awaiting consultation. Follow.  #2 multiple myeloma Oncology has been consulted.  #3 hypokalemia/hypomagnesemia/hypophosphatemia Replete electrolytes.  #4 anemia Likely anemia of chronic disease versus secondary to multiple myeloma. Patient with no overt bleeding. Patient status post 1 unit packed red blood cells. Hemoglobin currently at 8.8. Follow H&H. Oncology consultation pending.  #5 history of DVT On chronic anticoagulation. INR therapeutic. Coumadin per pharmacy.  #6 diabetes mellitus Hemoglobin A1c 6.2 on 06/11/2016. CBGs have ranged from 97-104. Oral hypoglycemic agents on hold. Continue sliding scale insulin.   #7 acute encephalopathy    Likely secondary to dehydration and hypercalcemia. Clinical improvement with hydration with IV fluids and improvement of calcium levels. CT head negative. Check a UA with cultures and sensitivities. Continue IV fluids. Follow.   DVT prophylaxis: Coumadin Code Status: Full Family Communication: Updated patient and daughter at bedside. Disposition Plan: Home when medically stable.   Consultants:   Oncology pending  Procedures:   CT head without contrast 07/30/2016  Plain films of the right knee 07/28/2016  1 unit packed red blood cells 07/29/2016  Antimicrobials:   None   Subjective: Patient laying in bed. Feeling better. Alert to self place and time. Patient asking whether she can go home today. Patient denies any shortness of breath. No chest pain. Patient denies any overt bleeding.  Objective: Vitals:   07/30/16 0425 07/30/16 1558 07/30/16 2113 07/31/16 0506  BP: (!) 144/64 (!) 122/53 (!) 156/62 115/68  Pulse: 73 67 71 82  Resp: '20 18 18 18  '$ Temp: 98.9 F (37.2 C) 98.5 F (36.9 C) 98.6 F (37 C) 99 F (37.2 C)  TempSrc: Oral Oral Oral Oral  SpO2: 100% 100% 100% 100%    Intake/Output Summary (Last 24 hours) at 07/31/16 1248 Last data filed at 07/31/16 0902  Gross per 24 hour  Intake             1840 ml  Output              200 ml  Net             1640 ml   There were no vitals filed for this visit.  Examination:  General exam: Appears calm and comfortable  Respiratory system: Bibasilar crackles left greater than right. Respiratory effort normal. Cardiovascular system: S1 &  S2 heard, RRR. No JVD, murmurs, rubs, gallops or clicks. No pedal edema. Gastrointestinal system: Abdomen is nondistended, soft and nontender. No organomegaly or masses felt. Normal bowel sounds heard. Central nervous system: Alert and oriented. No focal neurological deficits. Extremities: Status post left BKA. Skin: No rashes, lesions or ulcers Psychiatry: Judgement and insight  appear normal. Mood & affect appropriate.     Data Reviewed: I have personally reviewed following labs and imaging studies  CBC:  Recent Labs Lab 07/28/16 1356 07/29/16 1054 07/30/16 0532 07/31/16 0617  WBC 6.6 7.1 7.0 7.3  NEUTROABS 4.2 4.6  --   --   HGB 6.9 Repeated and verified X2.* 7.0* 8.6* 8.8*  HCT 21.3 Repeated and verified X2.* 21.8* 26.0* 26.3*  MCV 82.8 84.2 82.5 82.7  PLT 217.0 220 188 578   Basic Metabolic Panel:  Recent Labs Lab 07/28/16 1356 07/29/16 1054 07/30/16 0532 07/30/16 0930 07/31/16 0617  NA 143 140 142  --  143  K 3.9 3.7 3.3*  --  3.4*  CL 107 107 111  --  114*  CO2 '28 25 23  '$ --  21*  GLUCOSE 69* 127* 103*  --  104*  BUN 38* 36* 24*  --  13  CREATININE 1.55* 1.82* 1.32*  --  1.19*  CALCIUM 14.4* 14.6* 12.7* 12.3* 11.6*  MG  --   --   --  1.3* 2.2  PHOS  --   --   --  2.3* 3.6   GFR: CrCl cannot be calculated (Unknown ideal weight.). Liver Function Tests:  Recent Labs Lab 07/28/16 1356 07/29/16 1054  AST 80* 88*  ALT 19 20  ALKPHOS 64 66  BILITOT 0.2 0.4  PROT 7.0 7.1  ALBUMIN 4.1 4.2   No results for input(s): LIPASE, AMYLASE in the last 168 hours. No results for input(s): AMMONIA in the last 168 hours. Coagulation Profile:  Recent Labs Lab 07/28/16 1356 07/29/16 1054 07/30/16 0532 07/31/16 0617  INR 4.7* 3.80 3.20 2.83   Cardiac Enzymes: No results for input(s): CKTOTAL, CKMB, CKMBINDEX, TROPONINI in the last 168 hours. BNP (last 3 results) No results for input(s): PROBNP in the last 8760 hours. HbA1C: No results for input(s): HGBA1C in the last 72 hours. CBG:  Recent Labs Lab 07/30/16 1153 07/30/16 1658 07/30/16 2139 07/31/16 0740 07/31/16 1147  GLUCAP 96 132* 104* 97 104*   Lipid Profile: No results for input(s): CHOL, HDL, LDLCALC, TRIG, CHOLHDL, LDLDIRECT in the last 72 hours. Thyroid Function Tests: No results for input(s): TSH, T4TOTAL, FREET4, T3FREE, THYROIDAB in the last 72 hours. Anemia  Panel:  Recent Labs  07/30/16 0930  VITAMINB12 267  FOLATE 10.6  FERRITIN 353*  TIBC 328  IRON 58  RETICCTPCT 1.4   Sepsis Labs: No results for input(s): PROCALCITON, LATICACIDVEN in the last 168 hours.  No results found for this or any previous visit (from the past 240 hour(s)).       Radiology Studies: Ct Head Wo Contrast  Result Date: 07/30/2016 CLINICAL DATA:  Generalized weakness, confusion, history of multiple myeloma EXAM: CT HEAD WITHOUT CONTRAST TECHNIQUE: Contiguous axial images were obtained from the base of the skull through the vertex without intravenous contrast. COMPARISON:  09/10/2015 FINDINGS: Brain: Stable brain atrophy pattern and chronic white matter microvascular ischemic changes throughout the white matter. No acute intracranial hemorrhage, mass lesion, new infarction, midline shift, herniation, hydrocephalus, or extra-axial fluid collection. Cerebellar atrophy as well. Cisterns remain patent. Vascular: No hyperdense vessel or unexpected calcification. Skull: Chronic  lytic foci throughout the skull as before. Findings compatible with known myeloma. Negative for fracture or acute osseous finding. Sinuses/Orbits: No acute finding. Other: None. IMPRESSION: Stable atrophy and chronic white matter microvascular ischemic changes. No interval change or acute process by noncontrast CT. Electronically Signed   By: Jerilynn Mages.  Shick M.D.   On: 07/30/2016 16:02        Scheduled Meds: . insulin aspart  0-15 Units Subcutaneous TID WC  . insulin aspart  0-5 Units Subcutaneous QHS  . pantoprazole  40 mg Oral Daily  . sodium chloride flush  3 mL Intravenous Q12H  . warfarin  3 mg Oral ONCE-1800  . Warfarin - Pharmacist Dosing Inpatient   Does not apply q1800   Continuous Infusions: . sodium chloride 125 mL/hr at 07/31/16 0906     LOS: 0 days    Time spent: 28 minutes    Antonyo Hinderer, MD [facility] Pager [phone]  If 7PM-7AM, please contact  night-coverage [email or web address] Password [email or web address] 07/31/2016, 12:48 PM

## 2016-07-31 NOTE — Progress Notes (Signed)
ANTICOAGULATION CONSULT NOTE - Follow-Up Consult  Pharmacy Consult for Warfarin Indication: history of DVT  No Known Allergies  Patient Measurements:   Height: 5'2" Weight (as of 06/29/16): 47.6 kg  Vital Signs: Temp: 99 F (37.2 C) (11/30 0506) Temp Source: Oral (11/30 0506) BP: 115/68 (11/30 0506) Pulse Rate: 82 (11/30 0506)  Labs:  Recent Labs  07/29/16 1054 07/30/16 0532 07/31/16 0617  HGB 7.0* 8.6* 8.8*  HCT 21.8* 26.0* 26.3*  PLT 220 188 172  LABPROT 38.4* 33.5* 30.3*  INR 3.80 3.20 2.83  CREATININE 1.82* 1.32* 1.19*    CrCl cannot be calculated (Unknown ideal weight.).   Medical History: Past Medical History:  Diagnosis Date  . Anemia   . Anxiety    takes Xanax daily as needed  . Blood transfusion   . Cardiomyopathy ([place])   . Deep vein thrombosis of bilateral lower extremities (HCC)   . Degenerative joint disease   . Depression    Mild  . Diabetes mellitus    takes Glipizide daily  . Family history of anesthesia complication    DAUGHTER CPR AFTER  . GERD (gastroesophageal reflux disease)    takes Protonix daily  . Heart murmur   . History of chicken pox   . History of radiation therapy 08/30/13-09/20/13   20 gray to lower lumbar/upper sacrum  . History of shingles   . Hyperlipidemia   . Hypertension    takes Amlodipine,HCTZ,and Lisinopril daily  . Multiple myeloma    per Dr. Julien Nordmann, s/p palliative radiation for leg pain 2011 per Dr. Sondra Come  . Phlebitis   . UTI (urinary tract infection) 09/08/2013    Assessment:  80 year female with PMH significant for multiple myeloma, DM, and bilateral LE DVT (was on warfarin PTA), presented with c/o weakness and decreased eating/drinking over the past few days.  Sent to ED from PCP with noted elevated calcium and protime.  To be admitted for hypercalcemia, anemia.  Pharmacy consulted to manage warfarin therapy  PTA warfarin regimen = '5mg'$  daily with last dose reported as taken on 07/28/16 @  17:30  INR upon admission (07/28/16) = 3.8  Today, 07/31/16:  INR trending down, now therapeutic after holding warfarin on 11/28 and 11/29  Hgb improved, stable s/p 1 unit PRBCs, Pltc WNL.  No overt bleeding reported according to chart note.  Diet: Carb modified. Charted as eating 50% of dinner tray last night and 50% of breakfast tray this AM  CT head done 11/29 because of confusion: no acute process or interval change noted.  Goal of Therapy:  INR 2-3   Plan:   Warfarin 3 mg PO today at 6pm.  Daily PT/INR while inpatient  Monitor closely for s/sx of bleeding  Clayburn Pert, PharmD, BCPS Pager: [phone] 07/31/2016  9:42 AM

## 2016-07-31 NOTE — Progress Notes (Signed)
Physical Therapy Treatment Patient Details Name: PERSIS GRAFFIUS MRN: [id number] DOB: Mar 21, 1936 Today's Date: 07/31/2016    History of Present Illness Pt admitted with confusion, decreased HgB, and elevated Ca+.  Pt with hx of DM and L BKA (10/09/15)    PT Comments    Pt OOB in recliner just received a bath from daughter.  Applied L LE prosthesis and assisted with amb a limited distance.  Pt progressing well and following all commands.  Appears close to prior level.   Follow Up Recommendations  Home health PT     Equipment Recommendations  None recommended by PT    Recommendations for Other Services       Precautions / Restrictions Precautions Precautions: Fall Required Braces or Orthoses: Other Brace/Splint Other Brace/Splint:  L LE Prosthesis in room Restrictions Weight Bearing Restrictions: No    Mobility  Bed Mobility               General bed mobility comments: Pt OOB in recliner  Transfers Overall transfer level: Needs assistance Equipment used: Rolling walker (2 wheeled) Transfers: Sit to/from Stand Sit to Stand: Min assist         General transfer comment: min cues for transition position and saftey awareness and proper hand placement  Ambulation/Gait Ambulation/Gait assistance: Min guard;Min assist Ambulation Distance (Feet): 38 Feet Assistive device: Rolling walker (2 wheeled) Gait Pattern/deviations: Step-through pattern;Decreased step length - left Gait velocity: decr   General Gait Details: cues for posture and position from RW.  Poor kyphotic posture and walker too far front   Stairs            Wheelchair Mobility    Modified Rankin (Stroke Patients Only)       Balance                                    Cognition Arousal/Alertness: Awake/alert Behavior During Therapy: WFL for tasks assessed/performed Overall Cognitive Status: Within Functional Limits for tasks assessed                       Exercises      General Comments        Pertinent Vitals/Pain Pain Assessment: No/denies pain    Home Living                      Prior Function            PT Goals (current goals can now be found in the care plan section) Progress towards PT goals: Progressing toward goals    Frequency    Min 3X/week      PT Plan Current plan remains appropriate    Co-evaluation             End of Session Equipment Utilized During Treatment: Gait belt Activity Tolerance: Patient tolerated treatment well Patient left: in chair;with call bell/phone within reach;with chair alarm set     Time: 6269-4854 PT Time Calculation (min) (ACUTE ONLY): 25 min  Charges:  $Gait Training: 8-22 mins $Therapeutic Activity: 8-22 mins                    G Codes:      Rica Koyanagi  PTA [facility]  Acute  Rehab Pager      [phone]

## 2016-07-31 NOTE — Telephone Encounter (Signed)
Called to confirm 12/11 appt , pt currently in hospital

## 2016-08-01 DIAGNOSIS — Z86718 Personal history of other venous thrombosis and embolism: Secondary | ICD-10-CM

## 2016-08-01 LAB — BASIC METABOLIC PANEL
Anion gap: 9 (ref 5–15)
BUN: 19 mg/dL (ref 6–20)
CHLORIDE: 110 mmol/L (ref 101–111)
CO2: 21 mmol/L — AB (ref 22–32)
CREATININE: 1.36 mg/dL — AB (ref 0.44–1.00)
Calcium: 11.8 mg/dL — ABNORMAL HIGH (ref 8.9–10.3)
GFR calc Af Amer: 42 mL/min — ABNORMAL LOW (ref >60)
GFR calc non Af Amer: 36 mL/min — ABNORMAL LOW (ref >60)
GLUCOSE: 173 mg/dL — AB (ref 65–99)
POTASSIUM: 3.7 mmol/L (ref 3.5–5.1)
Sodium: 140 mmol/L (ref 135–145)

## 2016-08-01 LAB — CBC
HEMATOCRIT: 27.6 % — AB (ref 36.0–46.0)
Hemoglobin: 9.1 g/dL — ABNORMAL LOW (ref 12.0–15.0)
MCH: 27.1 pg (ref 26.0–34.0)
MCHC: 33 g/dL (ref 30.0–36.0)
MCV: 82.1 fL (ref 78.0–100.0)
PLATELETS: 165 10*3/uL (ref 150–400)
RBC: 3.36 MIL/uL — ABNORMAL LOW (ref 3.87–5.11)
RDW: 16.6 % — AB (ref 11.5–15.5)
WBC: 6.6 10*3/uL (ref 4.0–10.5)

## 2016-08-01 LAB — GLUCOSE, CAPILLARY
GLUCOSE-CAPILLARY: 114 mg/dL — AB (ref 65–99)
GLUCOSE-CAPILLARY: 79 mg/dL (ref 65–99)
Glucose-Capillary: 102 mg/dL — ABNORMAL HIGH (ref 65–99)
Glucose-Capillary: 131 mg/dL — ABNORMAL HIGH (ref 65–99)

## 2016-08-01 LAB — PROTIME-INR
INR: 2.01
Prothrombin Time: 23 seconds — ABNORMAL HIGH (ref 11.4–15.2)

## 2016-08-01 MED ORDER — MIRTAZAPINE 15 MG PO TABS
7.5000 mg | ORAL_TABLET | Freq: Every day | ORAL | Status: DC
  Administered 2016-08-01 – 2016-08-02 (×2): 7.5 mg via ORAL
  Filled 2016-08-01 (×2): qty 1

## 2016-08-01 MED ORDER — SODIUM CHLORIDE 0.9 % IV SOLN
60.0000 mg | Freq: Once | INTRAVENOUS | Status: AC
  Administered 2016-08-01: 60 mg via INTRAVENOUS
  Filled 2016-08-01: qty 20
  Filled 2016-08-01: qty 6.67

## 2016-08-01 MED ORDER — WARFARIN SODIUM 5 MG PO TABS
5.0000 mg | ORAL_TABLET | Freq: Once | ORAL | Status: DC
Start: 2016-08-01 — End: 2016-08-02

## 2016-08-01 NOTE — Progress Notes (Signed)
PROGRESS NOTE    [name]  TGG:[id number] DOB: 1936-08-21 DOA: 07/29/2016 PCP: [name], MD    Brief Narrative:  Patient is a 80 year old female history of multiple myeloma loss of follow-up, hypertension, diabetes, anxiety, status post left BKA presented to the ED with generalized weakness and confusion. Hemoglobin on admission was noted to be 7.0. Patient was also noted to be hypercalcemic with a calcium level of 14.6.   Assessment & Plan:   Principal Problem:   Hypercalcemia Active Problems:   Multiple myeloma (HCC)   Diabetes mellitus without complication (HCC)   Essential hypertension   Deep vein thrombosis of left lower extremity (HCC)   Deep vein thrombosis of right lower extremity ([place])   Long term current use of anticoagulant therapy   History of DVT (deep vein thrombosis)  #1 hypercalcemia Questionable etiology. May be secondary to multiple myeloma. Patient has not been compliant with follow-ups with her multiple myeloma. Patient also on oral calcium supplementations at home. Phosphorus level is low. Magnesium level is low. Intact PTH low. Replete electrolytes. Intact calcium pending. rPTH pending. Calcium levels trending down. Continue IV fluids and IV lasix '40mg'$  daily. Will give IV pamidronate 60 mg IV 1. Patient will likely need to follow-up with oncology in the outpatient setting.  #2 multiple myeloma Oncology has been consulted.  #3 hypokalemia/hypomagnesemia/hypophosphatemia Replete electrolytes.  #4 anemia Likely anemia of chronic disease versus secondary to multiple myeloma. Patient with no overt bleeding. Patient status post 1 unit packed red blood cells. Hemoglobin currently at 9.1. Follow H&H. Oncology consultation pending.  #5 history of DVT On chronic anticoagulation. INR therapeutic. Coumadin per pharmacy.  #6 diabetes mellitus Hemoglobin A1c 6.2 on 06/11/2016. CBGs have ranged from 79-114. Oral hypoglycemic agents on hold. Continue sliding  scale insulin.   #7 acute encephalopathy  Likely secondary to dehydration and hypercalcemia. Clinical improvement with hydration with IV fluids and improvement of calcium levels. CT head negative. UA nitrite negative. Leukocytes negative.    DVT prophylaxis: Coumadin Code Status: Full Family Communication: Updated patient and daughter at bedside. Disposition Plan: Home when medically stable.   Consultants:   Oncology pending  Procedures:   CT head without contrast 07/30/2016  Plain films of the right knee 07/28/2016  1 unit packed red blood cells 07/29/2016  Antimicrobials:   None   Subjective: Patient laying in bed. Feeling better. Alert to self place and time. Patient denies any shortness of breath. No chest pain. Patient denies any overt bleeding. Patient's daughter states they spoke with oncologist this morning.  Objective: Vitals:   07/31/16 1550 07/31/16 2058 08/01/16 0646 08/01/16 0906  BP: (!) 170/66 133/64 128/68 121/66  Pulse: 74 69 75 75  Resp: '18 19 18 '$ (!) 26  Temp: 98.5 F (36.9 C) 99.6 F (37.6 C) 98.5 F (36.9 C) 98.7 F (37.1 C)  TempSrc: Oral Oral Oral Oral  SpO2: 100% 100% 100% 100%  Weight:   45.8 kg (100 lb 15.5 oz)     Intake/Output Summary (Last 24 hours) at 08/01/16 1216 Last data filed at 08/01/16 0849  Gross per 24 hour  Intake             1444 ml  Output                0 ml  Net             1444 ml   Filed Weights   08/01/16 0646  Weight: 45.8 kg (100 lb 15.5 oz)  Examination:  General exam: Appears calm and comfortable  Respiratory system: Bibasilar crackles left greater than right. Respiratory effort normal. Cardiovascular system: S1 & S2 heard, RRR. No JVD, murmurs, rubs, gallops or clicks. No pedal edema. Gastrointestinal system: Abdomen is nondistended, soft and nontender. No organomegaly or masses felt. Normal bowel sounds heard. Central nervous system: Alert and oriented. No focal neurological deficits. Extremities:  Status post left BKA. Skin: No rashes, lesions or ulcers Psychiatry: Judgement and insight appear normal. Mood & affect appropriate.     Data Reviewed: I have personally reviewed following labs and imaging studies  CBC:  Recent Labs Lab 07/28/16 1356 07/29/16 1054 07/30/16 0532 07/31/16 0617 08/01/16 0857  WBC 6.6 7.1 7.0 7.3 6.6  NEUTROABS 4.2 4.6  --   --   --   HGB 6.9 Repeated and verified X2.* 7.0* 8.6* 8.8* 9.1*  HCT 21.3 Repeated and verified X2.* 21.8* 26.0* 26.3* 27.6*  MCV 82.8 84.2 82.5 82.7 82.1  PLT 217.0 220 188 172 169   Basic Metabolic Panel:  Recent Labs Lab 07/28/16 1356 07/29/16 1054 07/30/16 0532 07/30/16 0930 07/31/16 0617 08/01/16 0857  NA 143 140 142  --  143 140  K 3.9 3.7 3.3*  --  3.4* 3.7  CL 107 107 111  --  114* 110  CO2 '28 25 23  '$ --  21* 21*  GLUCOSE 69* 127* 103*  --  104* 173*  BUN 38* 36* 24*  --  13 19  CREATININE 1.55* 1.82* 1.32*  --  1.19* 1.36*  CALCIUM 14.4* 14.6* 12.7* 12.3* 11.6* 11.8*  MG  --   --   --  1.3* 2.2  --   PHOS  --   --   --  2.3* 3.6  --    GFR: Estimated Creatinine Clearance: 24.6 mL/min (by C-G formula based on SCr of 1.36 mg/dL (H)). Liver Function Tests:  Recent Labs Lab 07/28/16 1356 07/29/16 1054  AST 80* 88*  ALT 19 20  ALKPHOS 64 66  BILITOT 0.2 0.4  PROT 7.0 7.1  ALBUMIN 4.1 4.2   No results for input(s): LIPASE, AMYLASE in the last 168 hours. No results for input(s): AMMONIA in the last 168 hours. Coagulation Profile:  Recent Labs Lab 07/28/16 1356 07/29/16 1054 07/30/16 0532 07/31/16 0617 08/01/16 0857  INR 4.7* 3.80 3.20 2.83 2.01   Cardiac Enzymes: No results for input(s): CKTOTAL, CKMB, CKMBINDEX, TROPONINI in the last 168 hours. BNP (last 3 results) No results for input(s): PROBNP in the last 8760 hours. HbA1C: No results for input(s): HGBA1C in the last 72 hours. CBG:  Recent Labs Lab 07/31/16 1147 07/31/16 1719 07/31/16 2103 08/01/16 0745 08/01/16 1147    GLUCAP 104* 122* 116* 102* 114*   Lipid Profile: No results for input(s): CHOL, HDL, LDLCALC, TRIG, CHOLHDL, LDLDIRECT in the last 72 hours. Thyroid Function Tests: No results for input(s): TSH, T4TOTAL, FREET4, T3FREE, THYROIDAB in the last 72 hours. Anemia Panel:  Recent Labs  07/30/16 0930  VITAMINB12 267  FOLATE 10.6  FERRITIN 353*  TIBC 328  IRON 58  RETICCTPCT 1.4   Sepsis Labs: No results for input(s): PROCALCITON, LATICACIDVEN in the last 168 hours.  Recent Results (from the past 240 hour(s))  Culture, Urine     Status: Abnormal   Collection Time: 07/30/16  2:50 PM  Result Value Ref Range Status   Specimen Description URINE, CLEAN CATCH  Final   Special Requests NONE  Final   Culture (A)  Final    <  10,000 COLONIES/mL INSIGNIFICANT GROWTH Performed at [facility]    Report Status 07/31/2016 FINAL  Final         Radiology Studies: Ct Head Wo Contrast  Result Date: 07/30/2016 CLINICAL DATA:  Generalized weakness, confusion, history of multiple myeloma EXAM: CT HEAD WITHOUT CONTRAST TECHNIQUE: Contiguous axial images were obtained from the base of the skull through the vertex without intravenous contrast. COMPARISON:  09/10/2015 FINDINGS: Brain: Stable brain atrophy pattern and chronic white matter microvascular ischemic changes throughout the white matter. No acute intracranial hemorrhage, mass lesion, new infarction, midline shift, herniation, hydrocephalus, or extra-axial fluid collection. Cerebellar atrophy as well. Cisterns remain patent. Vascular: No hyperdense vessel or unexpected calcification. Skull: Chronic lytic foci throughout the skull as before. Findings compatible with known myeloma. Negative for fracture or acute osseous finding. Sinuses/Orbits: No acute finding. Other: None. IMPRESSION: Stable atrophy and chronic white matter microvascular ischemic changes. No interval change or acute process by noncontrast CT. Electronically Signed   By: Jerilynn Mages.   Shick M.D.   On: 07/30/2016 16:02        Scheduled Meds: . amLODipine  5 mg Oral Daily  . furosemide  40 mg Intravenous Daily  . insulin aspart  0-15 Units Subcutaneous TID WC  . insulin aspart  0-5 Units Subcutaneous QHS  . lisinopril  5 mg Oral Daily  . pamidronate  60 mg Intravenous Once  . pantoprazole  40 mg Oral Daily  . potassium chloride SA  20 mEq Oral Daily  . sodium chloride flush  3 mL Intravenous Q12H  . warfarin  5 mg Oral ONCE-1800  . Warfarin - Pharmacist Dosing Inpatient   Does not apply q1800   Continuous Infusions: . sodium chloride 100 mL/hr at 08/01/16 1204     LOS: 1 day    Time spent: 51 minutes    Lillie Bollig, MD [facility] Pager [phone]  If 7PM-7AM, please contact night-coverage [email or web address] Password [email or web address] 08/01/2016, 12:16 PM

## 2016-08-01 NOTE — Progress Notes (Signed)
ANTICOAGULATION CONSULT NOTE - Follow-Up Consult  Pharmacy Consult for Warfarin Indication: history of DVT  No Known Allergies  Patient Measurements: Weight: 100 lb 15.5 oz (45.8 kg) Height: 5'2" Weight (as of 06/29/16): 47.6 kg  Vital Signs: Temp: 98.7 F (37.1 C) (12/01 0906) Temp Source: Oral (12/01 0906) BP: 121/66 (12/01 0906) Pulse Rate: 75 (12/01 0906)  Labs:  Recent Labs  07/30/16 0532 07/31/16 0617 08/01/16 0857  HGB 8.6* 8.8* 9.1*  HCT 26.0* 26.3* 27.6*  PLT 188 172 165  LABPROT 33.5* 30.3* 23.0*  INR 3.20 2.83 2.01  CREATININE 1.32* 1.19* 1.36*    Estimated Creatinine Clearance: 24.6 mL/min (by C-G formula based on SCr of 1.36 mg/dL (H)).   Medical History: Past Medical History:  Diagnosis Date  . Anemia   . Anxiety    takes Xanax daily as needed  . Blood transfusion   . Cardiomyopathy ([place])   . Deep vein thrombosis of bilateral lower extremities (HCC)   . Degenerative joint disease   . Depression    Mild  . Diabetes mellitus    takes Glipizide daily  . Family history of anesthesia complication    DAUGHTER CPR AFTER  . GERD (gastroesophageal reflux disease)    takes Protonix daily  . Heart murmur   . History of chicken pox   . History of radiation therapy 08/30/13-09/20/13   20 gray to lower lumbar/upper sacrum  . History of shingles   . Hyperlipidemia   . Hypertension    takes Amlodipine,HCTZ,and Lisinopril daily  . Multiple myeloma    per Dr. Julien Nordmann, s/p palliative radiation for leg pain 2011 per Dr. Sondra Come  . Phlebitis   . UTI (urinary tract infection) 09/08/2013    Assessment:  80 year female with PMH significant for multiple myeloma, DM, and bilateral LE DVT (was on warfarin PTA), presented with c/o weakness and decreased eating/drinking over the past few days.  Sent to ED from PCP with noted elevated calcium and protime.  To be admitted for hypercalcemia, anemia.  Pharmacy consulted to manage warfarin therapy  PTA warfarin  regimen = '5mg'$  daily with last dose reported as taken on 07/28/16 @ 17:30  INR upon admission (07/28/16) = 3.8  Today, 08/01/16:  INR trending down and at low end of therapeutic range after holding warfarin on 11/28 and 11/29 and warfarin '3mg'$  on 11/30  Hgb improved, stable s/p 1 unit PRBCs, Pltc WNL.  No overt bleeding reported according to chart note.  Diet: Carb modified - tolerating  CT head done 11/29 because of confusion: no acute process or interval change noted.  Goal of Therapy:  INR 2-3   Plan:  1) Increase warfarin to '5mg'$  today 2) Daily INR   Adrian Saran, PharmD, BCPS Pager [phone] 08/01/2016 9:32 AM

## 2016-08-01 NOTE — Care Management Note (Signed)
Case Management Note  Patient Details  Name: [name] MRN: [id number] Date of Birth: September 06, 1935  Subjective/Objective:   80 yo admitted with Hypercalcemia.                 Action/Plan: From home with family. This CM spoke with daughter Djuana Littleton at bedside about DC plan. Daughter requesting home PT and Aide.  Choice offered for [facility] services and [facility] was chosen. [facility] rep alerted of referral. No DME needed per daughter.   Expected Discharge Date:   (unknown)               Expected Discharge Plan:  [place]  In-House Referral:     Discharge planning Services  CM Consult  Post Acute Care Choice:  Home Health Choice offered to:  Adult Children  DME Arranged:    DME Agency:     HH Arranged:  PT, Nurse's Aide [place] Agency:  [place]  Status of Service:  In process, will continue to follow  If discussed at Long Length of Stay Meetings, dates discussed:    Additional CommentsLynnell Catalan, RN 08/01/2016, 11:03 AM  [phone]

## 2016-08-01 NOTE — Progress Notes (Signed)
Physical Therapy Treatment Patient Details Name: [name] MRN: [id number] DOB: [birth year] Today's Date: 08/01/2016    History of Present Illness Pt admitted with confusion, decreased HgB, and elevated Ca+.  Pt with hx of DM and L BKA (10/09/15)    PT Comments    Assisted OOB to [facility] and back to bed.  Pt very fatigued.  Falling asleep on BSC.  Too fatigued to amb, assisted back to bed.  Follow Up Recommendations  Home health PT     Equipment Recommendations  None recommended by PT    Recommendations for Other Services       Precautions / Restrictions Precautions Precautions: Fall Required Braces or Orthoses: Other Brace/Splint Other Brace/Splint:  L LE Prosthesis in room Restrictions Weight Bearing Restrictions: No    Mobility  Bed Mobility Overal bed mobility: Needs Assistance Bed Mobility: Sit to Supine;Supine to Sit     Supine to sit: Min guard;Min assist Sit to supine: Min guard;Min assist   General bed mobility comments: required increased assist due to increased c/o fatigue.  Daughter reports poor sleep last night and freq urination.  Transfers Overall transfer level: Needs assistance Equipment used: None Transfers: Sit to/from [facility] Sit to Stand: Min assist;Mod assist;+2 physical assistance Stand pivot transfers: Min assist;Mod assist;+2 safety/equipment       General transfer comment: required increased assist to and from [facility] due to increased c/o fatigue and due to urgency did not apply her L LE prosthesis  Ambulation/Gait             General Gait Details: too fatigued to attempt ambulation at this time   Stairs            Wheelchair Mobility    Modified Rankin (Stroke Patients Only)       Balance                                    Cognition Arousal/Alertness: Awake/alert Behavior During Therapy: WFL for tasks assessed/performed Overall Cognitive Status: Within Functional Limits for tasks  assessed                      Exercises      General Comments        Pertinent Vitals/Pain Pain Assessment: No/denies pain    Home Living                      Prior Function            PT Goals (current goals can now be found in the care plan section) Progress towards PT goals: Progressing toward goals    Frequency    Min 3X/week      PT Plan Current plan remains appropriate    Co-evaluation             End of Session Equipment Utilized During Treatment: Gait belt Activity Tolerance: Patient tolerated treatment well Patient left: with call bell/phone within reach;in bed     Time: 1049-1100 PT Time Calculation (min) (ACUTE ONLY): 11 min  Charges:  $Gait Training: 8-22 mins                    G Codes:      Rica Koyanagi  PTA [facility]  Acute  Rehab Pager      [phone]

## 2016-08-02 LAB — BASIC METABOLIC PANEL
Anion gap: 9 (ref 5–15)
BUN: 20 mg/dL (ref 6–20)
CALCIUM: 12 mg/dL — AB (ref 8.9–10.3)
CO2: 25 mmol/L (ref 22–32)
CREATININE: 1.28 mg/dL — AB (ref 0.44–1.00)
Chloride: 108 mmol/L (ref 101–111)
GFR calc Af Amer: 45 mL/min — ABNORMAL LOW (ref >60)
GFR calc non Af Amer: 39 mL/min — ABNORMAL LOW (ref >60)
GLUCOSE: 95 mg/dL (ref 65–99)
Potassium: 3.6 mmol/L (ref 3.5–5.1)
Sodium: 142 mmol/L (ref 135–145)

## 2016-08-02 LAB — GLUCOSE, CAPILLARY
Glucose-Capillary: 100 mg/dL — ABNORMAL HIGH (ref 65–99)
Glucose-Capillary: 91 mg/dL (ref 65–99)
Glucose-Capillary: 102 mg/dL — ABNORMAL HIGH (ref 65–99)
Glucose-Capillary: 93 mg/dL (ref 65–99)

## 2016-08-02 LAB — PROTIME-INR
INR: 1.72
Prothrombin Time: 20.4 seconds — ABNORMAL HIGH (ref 11.4–15.2)

## 2016-08-02 LAB — CBC
HEMATOCRIT: 26.9 % — AB (ref 36.0–46.0)
Hemoglobin: 8.8 g/dL — ABNORMAL LOW (ref 12.0–15.0)
MCH: 27.3 pg (ref 26.0–34.0)
MCHC: 32.7 g/dL (ref 30.0–36.0)
MCV: 83.5 fL (ref 78.0–100.0)
Platelets: 182 10*3/uL (ref 150–400)
RBC: 3.22 MIL/uL — ABNORMAL LOW (ref 3.87–5.11)
RDW: 16.7 % — AB (ref 11.5–15.5)
WBC: 7.1 10*3/uL (ref 4.0–10.5)

## 2016-08-02 MED ORDER — WARFARIN SODIUM 5 MG PO TABS
5.0000 mg | ORAL_TABLET | Freq: Once | ORAL | Status: AC
  Administered 2016-08-02: 5 mg via ORAL
  Filled 2016-08-02: qty 1

## 2016-08-02 MED ORDER — FUROSEMIDE 10 MG/ML IJ SOLN: 40.0000 mg | Freq: Every day | INTRAMUSCULAR | Status: DC

## 2016-08-02 NOTE — Progress Notes (Signed)
PROGRESS NOTE    [name]  VXB:[id number] DOB: Jul 03, 1936 DOA: 07/29/2016 PCP: [name], MD    Brief Narrative:  Patient is a 80 year old female history of multiple myeloma loss of follow-up, hypertension, diabetes, anxiety, status post left BKA presented to the ED with generalized weakness and confusion. Hemoglobin on admission was noted to be 7.0. Patient was also noted to be hypercalcemic with a calcium level of 14.6.   Assessment & Plan:   Principal Problem:   Hypercalcemia Active Problems:   Multiple myeloma (HCC)   Diabetes mellitus without complication (HCC)   Essential hypertension   Deep vein thrombosis of left lower extremity (HCC)   Deep vein thrombosis of right lower extremity ([place])   Long term current use of anticoagulant therapy   History of DVT (deep vein thrombosis)  #1 hypercalcemia Questionable etiology. May be secondary to multiple myeloma. Patient has not been compliant with follow-ups with her multiple myeloma. Patient also on oral calcium supplementations at home. Phosphorus level is low. Magnesium level is low. Intact PTH low. Replete electrolytes. Intact calcium pending. rPTH pending. Calcium levels fluctuating. Discontinue IV Lasix. Continue IV fluids. Patient status post IV pamidronate 60 mg IV 1 on 08/01/2016. Patient will likely need to follow-up with oncology in the outpatient setting.  #2 multiple myeloma Oncology has been consulted.  #3 hypokalemia/hypomagnesemia/hypophosphatemia Replete electrolytes.  #4 anemia Likely anemia of chronic disease versus secondary to multiple myeloma. Patient with no overt bleeding. Patient status post 1 unit packed red blood cells. Hemoglobin currently at 9.1. Follow H&H. Oncology consultation pending.  #5 history of DVT On chronic anticoagulation. INR subtherapeutic. Coumadin per pharmacy.  #6 diabetes mellitus Hemoglobin A1c 6.2 on 06/11/2016. CBGs have ranged from 91-131. Oral hypoglycemic agents on  hold. Continue sliding scale insulin.   #7 acute encephalopathy  Likely secondary to dehydration and hypercalcemia. Clinical improvement with hydration with IV fluids and improvement of calcium levels. CT head negative. UA nitrite negative. Leukocytes negative.    DVT prophylaxis: Coumadin Code Status: Full Family Communication: Updated patient and daughter at bedside. Disposition Plan: Home when medically stable.   Consultants:   Oncology pending  Procedures:   CT head without contrast 07/30/2016  Plain films of the right knee 07/28/2016  1 unit packed red blood cells 07/29/2016  Antimicrobials:   None   Subjective: Patient sitting up on bedside commode. Feels better. Was to go home. No chest pain. No shortness of breath. No bleeding. Daughter at bedside.   Objective: Vitals:   08/01/16 0906 08/01/16 1347 08/01/16 2021 08/02/16 0420  BP: 121/66 (!) 121/59 (!) 152/56 (!) 144/61  Pulse: 75 73 67 77  Resp: (!) 26 (!) '24 18 18  '$ Temp: 98.7 F (37.1 C) 98 F (36.7 C) 98.9 F (37.2 C) 99 F (37.2 C)  TempSrc: Oral Oral Oral Oral  SpO2: 100% 100% 100% 99%  Weight:    45.8 kg (100 lb 15.5 oz)    Intake/Output Summary (Last 24 hours) at 08/02/16 1234 Last data filed at 08/02/16 0912  Gross per 24 hour  Intake              240 ml  Output              125 ml  Net              115 ml   Filed Weights   08/01/16 0646 08/02/16 0420  Weight: 45.8 kg (100 lb 15.5 oz) 45.8 kg (100 lb 15.5 oz)  Examination:  General exam: Appears calm and comfortable  Respiratory system: CTAB. Respiratory effort normal. Cardiovascular system: S1 & S2 heard, RRR. No JVD, murmurs, rubs, gallops or clicks. No pedal edema. Gastrointestinal system: Abdomen is nondistended, soft and nontender. No organomegaly or masses felt. Normal bowel sounds heard. Central nervous system: Alert and oriented. No focal neurological deficits. Extremities: Status post left BKA. Skin: No rashes, lesions or  ulcers Psychiatry: Judgement and insight appear normal. Mood & affect appropriate.     Data Reviewed: I have personally reviewed following labs and imaging studies  CBC:  Recent Labs Lab 07/28/16 1356 07/29/16 1054 07/30/16 0532 07/31/16 0617 08/01/16 0857 08/02/16 0606  WBC 6.6 7.1 7.0 7.3 6.6 7.1  NEUTROABS 4.2 4.6  --   --   --   --   HGB 6.9 Repeated and verified X2.* 7.0* 8.6* 8.8* 9.1* 8.8*  HCT 21.3 Repeated and verified X2.* 21.8* 26.0* 26.3* 27.6* 26.9*  MCV 82.8 84.2 82.5 82.7 82.1 83.5  PLT 217.0 220 188 172 165 782   Basic Metabolic Panel:  Recent Labs Lab 07/29/16 1054 07/30/16 0532 07/30/16 0930 07/31/16 0617 08/01/16 0857 08/02/16 0606  NA 140 142  --  143 140 142  K 3.7 3.3*  --  3.4* 3.7 3.6  CL 107 111  --  114* 110 108  CO2 25 23  --  21* 21* 25  GLUCOSE 127* 103*  --  104* 173* 95  BUN 36* 24*  --  '13 19 20  '$ CREATININE 1.82* 1.32*  --  1.19* 1.36* 1.28*  CALCIUM 14.6* 12.7* 12.3* 11.6* 11.8* 12.0*  MG  --   --  1.3* 2.2  --   --   PHOS  --   --  2.3* 3.6  --   --    GFR: Estimated Creatinine Clearance: 26.2 mL/min (by C-G formula based on SCr of 1.28 mg/dL (H)). Liver Function Tests:  Recent Labs Lab 07/28/16 1356 07/29/16 1054  AST 80* 88*  ALT 19 20  ALKPHOS 64 66  BILITOT 0.2 0.4  PROT 7.0 7.1  ALBUMIN 4.1 4.2   No results for input(s): LIPASE, AMYLASE in the last 168 hours. No results for input(s): AMMONIA in the last 168 hours. Coagulation Profile:  Recent Labs Lab 07/29/16 1054 07/30/16 0532 07/31/16 0617 08/01/16 0857 08/02/16 0606  INR 3.80 3.20 2.83 2.01 1.72   Cardiac Enzymes: No results for input(s): CKTOTAL, CKMB, CKMBINDEX, TROPONINI in the last 168 hours. BNP (last 3 results) No results for input(s): PROBNP in the last 8760 hours. HbA1C: No results for input(s): HGBA1C in the last 72 hours. CBG:  Recent Labs Lab 08/01/16 1147 08/01/16 1705 08/01/16 2024 08/02/16 0740 08/02/16 1139  GLUCAP 114* 79  131* 100* 91   Lipid Profile: No results for input(s): CHOL, HDL, LDLCALC, TRIG, CHOLHDL, LDLDIRECT in the last 72 hours. Thyroid Function Tests: No results for input(s): TSH, T4TOTAL, FREET4, T3FREE, THYROIDAB in the last 72 hours. Anemia Panel: No results for input(s): VITAMINB12, FOLATE, FERRITIN, TIBC, IRON, RETICCTPCT in the last 72 hours. Sepsis Labs: No results for input(s): PROCALCITON, LATICACIDVEN in the last 168 hours.  Recent Results (from the past 240 hour(s))  Culture, Urine     Status: Abnormal   Collection Time: 07/30/16  2:50 PM  Result Value Ref Range Status   Specimen Description URINE, CLEAN CATCH  Final   Special Requests NONE  Final   Culture (A)  Final    <10,000 COLONIES/mL INSIGNIFICANT GROWTH Performed  at [facility]    Report Status 07/31/2016 FINAL  Final         Radiology Studies: No results found.      Scheduled Meds: . amLODipine  5 mg Oral Daily  . insulin aspart  0-15 Units Subcutaneous TID WC  . insulin aspart  0-5 Units Subcutaneous QHS  . lisinopril  5 mg Oral Daily  . mirtazapine  7.5 mg Oral QHS  . pantoprazole  40 mg Oral Daily  . potassium chloride SA  20 mEq Oral Daily  . sodium chloride flush  3 mL Intravenous Q12H  . warfarin  5 mg Oral Once  . Warfarin - Pharmacist Dosing Inpatient   Does not apply q1800   Continuous Infusions: . sodium chloride 100 mL/hr at 08/01/16 1204     LOS: 2 days    Time spent: 83 minutes    Libni Fusaro, MD [facility] Pager [phone]  If 7PM-7AM, please contact night-coverage [email or web address] Password [facility] 08/02/2016, 12:34 PM

## 2016-08-02 NOTE — Progress Notes (Signed)
ANTICOAGULATION CONSULT NOTE - Follow-Up Consult  Pharmacy Consult for Warfarin Indication: history of DVT  No Known Allergies  Patient Measurements: Weight: 100 lb 15.5 oz (45.8 kg) Height: 5'2" Weight (as of 06/29/16): 47.6 kg  Vital Signs: Temp: 99 F (37.2 C) (12/02 0420) Temp Source: Oral (12/02 0420) BP: 144/61 (12/02 0420) Pulse Rate: 77 (12/02 0420)  Labs:  Recent Labs  07/31/16 0617 08/01/16 0857 08/02/16 0606  HGB 8.8* 9.1* 8.8*  HCT 26.3* 27.6* 26.9*  PLT 172 165 182  LABPROT 30.3* 23.0* 20.4*  INR 2.83 2.01 1.72  CREATININE 1.19* 1.36* 1.28*    Estimated Creatinine Clearance: 26.2 mL/min (by C-G formula based on SCr of 1.28 mg/dL (H)).   Assessment:  80 year female with PMH significant for multiple myeloma, DM, and bilateral LE DVT (was on warfarin PTA), presented with c/o weakness and decreased eating/drinking over the past few days.  Sent to ED from PCP with noted elevated calcium and protime.  To be admitted for hypercalcemia, anemia.  Pharmacy consulted to manage warfarin therapy  PTA warfarin regimen = 232m daily with last dose reported as taken on 07/28/16 @ 17:30  INR upon admission (07/28/16) = 3.8  Today, 08/02/16:  INR SUBtherapeutic after holding warfarin on 11/28 and 11/29 and warfarin 3132mon 11/30.  Warfarin 32m37mO x 1 ordered 12/1PM but dose not charted as given  Hgb low/stable s/p 1 unit PRBCs on 11/28, Pltc WNL.  No overt bleeding reported according to chart note.  Diet: Carb modified - tolerating  CT head done 11/29 because of confusion: no acute process or interval change noted.  Goal of Therapy:  INR 2-3   Plan:  1) warfarin to 32mg6m x 1 today.  Does not appear warfarin dose given last evening as no documentation of dose being given per [facility]  DustDoreene ElandarmD, BCPS.   Pager: [phone]/2017 11:42 AM

## 2016-08-03 LAB — CBC
HCT: 25.3 % — ABNORMAL LOW (ref 36.0–46.0)
Hemoglobin: 8.1 g/dL — ABNORMAL LOW (ref 12.0–15.0)
MCH: 27.4 pg (ref 26.0–34.0)
MCHC: 32 g/dL (ref 30.0–36.0)
MCV: 85.5 fL (ref 78.0–100.0)
PLATELETS: 167 10*3/uL (ref 150–400)
RBC: 2.96 MIL/uL — AB (ref 3.87–5.11)
RDW: 17 % — AB (ref 11.5–15.5)
WBC: 6.3 10*3/uL (ref 4.0–10.5)

## 2016-08-03 LAB — BASIC METABOLIC PANEL
Anion gap: 7 (ref 5–15)
BUN: 23 mg/dL — ABNORMAL HIGH (ref 6–20)
CALCIUM: 10.9 mg/dL — AB (ref 8.9–10.3)
CO2: 22 mmol/L (ref 22–32)
CREATININE: 1.44 mg/dL — AB (ref 0.44–1.00)
Chloride: 110 mmol/L (ref 101–111)
GFR calc non Af Amer: 34 mL/min — ABNORMAL LOW (ref >60)
GFR, EST AFRICAN AMERICAN: 39 mL/min — AB (ref >60)
Glucose, Bld: 90 mg/dL (ref 65–99)
Potassium: 3.6 mmol/L (ref 3.5–5.1)
Sodium: 139 mmol/L (ref 135–145)

## 2016-08-03 LAB — PTH-RELATED PEPTIDE

## 2016-08-03 LAB — GLUCOSE, CAPILLARY
Glucose-Capillary: 91 mg/dL (ref 65–99)
Glucose-Capillary: 95 mg/dL (ref 65–99)

## 2016-08-03 LAB — PROTIME-INR
INR: 1.53
PROTHROMBIN TIME: 18.5 s — AB (ref 11.4–15.2)

## 2016-08-03 MED ORDER — WARFARIN SODIUM 5 MG PO TABS: 5.0000 mg | ORAL_TABLET | Freq: Every day | ORAL | 0 refills | Status: DC

## 2016-08-03 MED ORDER — WARFARIN SODIUM 5 MG PO TABS
7.5000 mg | ORAL_TABLET | Freq: Once | ORAL | Status: AC
  Administered 2016-08-03: 7.5 mg via ORAL
  Filled 2016-08-03: qty 1

## 2016-08-03 MED ORDER — FAMOTIDINE 20 MG PO TABS
20.0000 mg | ORAL_TABLET | Freq: Two times a day (BID) | ORAL | Status: DC
  Administered 2016-08-03: 20 mg via ORAL
  Filled 2016-08-03: qty 1

## 2016-08-03 MED ORDER — MIRTAZAPINE 7.5 MG PO TABS: 7.5000 mg | ORAL_TABLET | Freq: Every evening | ORAL | 0 refills | Status: DC | PRN

## 2016-08-03 MED ORDER — ENOXAPARIN SODIUM 60 MG/0.6ML ~~LOC~~ SOLN: 50.0000 mg | Freq: Once | SUBCUTANEOUS | Status: DC

## 2016-08-03 MED ORDER — ENOXAPARIN SODIUM 60 MG/0.6ML ~~LOC~~ SOLN
50.0000 mg | SUBCUTANEOUS | Status: DC
  Administered 2016-08-03: 50 mg via SUBCUTANEOUS
  Filled 2016-08-03: qty 0.6

## 2016-08-03 NOTE — Care Management Note (Signed)
Case Management Note  Patient Details  Name: [name] MRN: [id number] Date of Birth: [birth year]  Subjective/Objective:   Hypercalcemia, multiple myeloma                 Action/Plan: Discharge Planning: AVS reviewed: Pt gave permission to speak to her children.  NCM spoke to dtr, Leafy Half and son at bedside. Please see previous NCM notes. HH arranged with [facility]. Notified [facility] of scheduled dc home today. Orders in EPIC. Lives in home with son and dtr states she checks on pt daily. Provided dtr with private duty agency list. States services will be out of pocket if she does not have [place] or Medicaid. Dtr is wanting a aide that can come daily. Explained [facility] will send an aide 2-3x per week for bathing and dressing.   PCP Tonia Ghent MD   Expected Discharge Date: 08/03/2016      Expected Discharge Plan:  [place]  In-House Referral:  NA  Discharge planning Services  CM Consult  Post Acute Care Choice:  Home Health Choice offered to:  Adult Children  DME Arranged:  N/A DME Agency:  NA  HH Arranged:  PT, Nurse's Aide, RN, OT HH Agency:  [facility]  Status of Service:  Completed, signed off  If discussed at [place] of Stay Meetings, dates discussed:    Additional Comments:  Erenest Rasher, RN 08/03/2016, 12:41 PM

## 2016-08-03 NOTE — Discharge Summary (Signed)
Physician Discharge Summary  [name] VWP:[id number] DOB: 1936-05-18 DOA: 07/29/2016  PCP: [name], MD  Admit date: 07/29/2016 Discharge date: 08/03/2016  Time spent: 65 minutes  Recommendations for Outpatient Follow-up:  1. Follow-up at [name], MD office on Tuesday, 08/05/2016 for PT/INR check and further recommendations on Coumadin dosing. 2. Follow-up at [name], MD in 2 weeks. On follow-up patient will need a basic metabolic profile done to follow-up on electrolytes and renal function. Patient also needs a CBC done. 3. Follow-up with Dr. Lorna Few as scheduled. 4.    Discharge Diagnoses:  Principal Problem:   Hypercalcemia Active Problems:   Multiple myeloma (HCC)   Diabetes mellitus without complication (HCC)   Essential hypertension   Deep vein thrombosis of left lower extremity (HCC)   Deep vein thrombosis of right lower extremity ([place])   Long term current use of anticoagulant therapy   History of DVT (deep vein thrombosis)   Discharge Condition: Stable and improved  Diet recommendation: Carb modified diet  Filed Weights   08/01/16 0646 08/02/16 0420 08/03/16 0455  Weight: 45.8 kg (100 lb 15.5 oz) 45.8 kg (100 lb 15.5 oz) 46.2 kg (101 lb 13.6 oz)    History of present illness:  Per Dr Ralene Ok is a 80 y.o. female with medical history significant of MM lost to f/u, anxiety, essential htn, diabetes, and depression. Who presented to the hospital after the daughter saw her with generalized weakness, confusion. This has been a problem which had been getting gradually worse within the last 2 weeks. 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 Admitting physician, unable to get history from patient as she was confused as such history was obtained from chart, ED physician, and daughter.  Reportedly patient had 2 falls within the last week or so. Given her continued weakness and new problem of confusion patient was brought in for evaluation. The confusion is reported as a problem that began roughly one or 2 days ago. This has been persistent. Patient has been answering questions with answers that don't make sense to daughter. This has waxed and waned reportedly but was gradually getting worse  ED Course: Patient was found to have a hemoglobin of 7.0 with a calcium level 14.6. We were subsequently consulted for further medical evaluation recommendations.  Hospital Course:  #1 hypercalcemia Questionable etiology. May be secondary to multiple myeloma. Patient has not been compliant with follow-ups with her multiple myeloma. Patient also on oral calcium supplementations at home. Phosphorus level is low. Magnesium level is low. Intact PTH low. Repleted electrolytes. Intact calcium pending. rPTH pending. Calcium levels fluctuating. Discontinued IV Lasix. Patient was initially placed on IV Lasix and monitored. Patient's hypercalcemia started to improve. Patient was also placed on Lasix 40 mg daily. Patient's calcium levels followed however seemed as though they were plateauing. Patient was subsequently given IV pamidronate 60 mg IV 1 on 08/01/2016. Patient's calcium levels continue to trend down to 10.9 by day of discharge. Patient had improved clinically and was back to baseline. Patient is to follow-up with oncologist Dr. Lorna Few.  #2 multiple myeloma Oncology has been consulted. Patient will follow-up with oncology in the outpatient setting.  #3 hypokalemia/hypomagnesemia/hypophosphatemia During the hospitalization patient was noted to be hypokalemic, hypomagnesemic with hypophosphatemia. Patient's electrolytes were repleted prior to discharge. Outpatient follow-up.  #4 anemia Likely anemia  of chronic disease versus secondary to multiple myeloma. Patient with no overt  bleeding. Patient status post 1 unit packed red blood cells. Hemoglobin remained stable. Outpatient follow-up.  #5 history of DVT On chronic anticoagulation. INR on admission was supratherapeutic with an INR of 4.7. Patient's Coumadin was held and Coumadin dose per pharmacy. Patient's INR trended down and subsequently became subtherapeutic. Patient was given a high-dose of Coumadin 7.5 mg on day of discharge and instructed to take 7.5 mg of Coumadin on Monday, 08/04/2016. Patient is to follow-up at the Coumadin clinic on Tuesday, 08/05/2016 for PT/INR check. Patient was given full dose Lovenox on day of discharge. Patient will follow-up at PCPs office.   #6 diabetes mellitus Hemoglobin A1c 6.2 on 06/11/2016. Oral hypoglycemic agents were held throughout the hospitalization. Patient was maintained on a sliding scale insulin.   #7 acute encephalopathy  Likely secondary to dehydration and hypercalcemia. Clinical improvement with hydration with IV fluids and improvement of calcium levels. CT head negative. UA nitrite negative. Leukocytes negative.     Procedures:  CT head without contrast 07/30/2016  Plain films of the right knee 07/28/2016  1 unit packed red blood cells 07/29/2016   Consultations:  None  Discharge Exam: Vitals:   08/02/16 2032 08/03/16 0458  BP: (!) 106/55 (!) 115/52  Pulse: 72 72  Resp: 16 16  Temp: 98.9 F (37.2 C) 99.2 F (37.3 C)    General: NAD Cardiovascular: RRR Respiratory: CTAB  Discharge Instructions   Discharge Instructions    Diet Carb Modified    Complete by:  As directed    Increase activity slowly    Complete by:  As directed      Current Discharge Medication List    START taking these medications   Details  mirtazapine (REMERON) 7.5 MG tablet Take 1 tablet (7.5 mg total) by mouth at bedtime as needed. Qty: 15 tablet, Refills: 0      CONTINUE these medications which have CHANGED   Details  warfarin (COUMADIN) 5 MG tablet  Take 1-1.5 tablets (5-7.5 mg total) by mouth daily. Take 1.5(7.32m) tomorrow 08/05/2016, then per PCP. Qty: 20 tablet, Refills: 0   Associated Diagnoses: Acute thromboembolism of deep veins of both lower extremities (HCC)      CONTINUE these medications which have NOT CHANGED   Details  acetaminophen (TYLENOL) 500 MG tablet Take 500 mg by mouth every 6 (six) hours as needed for mild pain.    ALPRAZolam (XANAX) 0.5 MG tablet Take 1 tablet (0.5 mg total) by mouth 2 (two) times daily as needed. for anxiety Qty: 60 tablet, Refills: 2   Associated Diagnoses: Encounter for immunization    amLODipine (NORVASC) 5 MG tablet Take 1 tablet (5 mg total) by mouth daily. Qty: 30 tablet, Refills: 5   Associated Diagnoses: Encounter for immunization    diclofenac sodium (VOLTAREN) 1 % GEL Apply 2 g topically 2 (two) times daily as needed (for shoulder pain). Qty: 100 g, Refills: 1   Associated Diagnoses: Encounter for immunization    glipiZIDE (GLUCOTROL XL) 2.5 MG 24 hr tablet Take 1 tablet (2.5 mg total) by mouth daily. Qty: 30 tablet, Refills: 5   Associated Diagnoses: Encounter for immunization    hydrochlorothiazide (MICROZIDE) 12.5 MG capsule Take 1 capsule (12.5 mg total) by mouth daily. Qty: 30 capsule, Refills: 5   Associated Diagnoses: Encounter for immunization    lisinopril (PRINIVIL,ZESTRIL) 5 MG tablet Take 1 tablet (5 mg total) by mouth daily. Qty: 30 tablet, Refills: 5  Associated Diagnoses: Encounter for immunization    pantoprazole (PROTONIX) 40 MG tablet Take 1 tablet (40 mg total) by mouth daily. Qty: 30 tablet, Refills: 5   Associated Diagnoses: Encounter for immunization    potassium chloride SA (K-DUR,KLOR-CON) 20 MEQ tablet Take 1 tablet (20 mEq total) by mouth daily. Qty: 30 tablet, Refills: 5   Associated Diagnoses: Encounter for immunization    traMADol (ULTRAM) 50 MG tablet 66m (1 tab) every 4 to 6 hours as needed for pain.  Sedation caution.    glucose blood  (ONE TOUCH ULTRA TEST) test strip One touch ultra 2 test strips.  Use daily.  Dx e11.9. Qty: 100 each, Refills: 3    ONE TOUCH LANCETS MISC USE TO CHECK BLOOD SUGAR TWO TIMES DAILY.    DX:  E11.9 Qty: 200 each, Refills: 3      STOP taking these medications     Calcium Carbonate-Vitamin D 600-400 MG-UNIT tablet        No Known Allergies Follow-up Information    [facility] up.   Why:  Home Health RN, Physical Therapy, Occupational Therapy and aide Contact information: [address] [place] [phone]       GElsie Stain MD. Schedule an appointment as soon as possible for a visit in 2 week(s).   Specialty:  Family Medicine Contact information: [phone]        MEilleen Kempf, MD .   Specialty:  Oncology Contact information: [phone]       Dr GElsie StainFollow up on 08/05/2016.   Why:  f/u on 08/05/2016 for PT/INR check. (coumadin clinic)           The results of significant diagnostics from this hospitalization (including imaging, microbiology, ancillary and laboratory) are listed below for reference.    Significant Diagnostic Studies: Ct Head Wo Contrast  Result Date: 07/30/2016 CLINICAL DATA:  Generalized weakness, confusion, history of multiple myeloma EXAM: CT HEAD WITHOUT CONTRAST TECHNIQUE: Contiguous axial images were obtained from the base of the skull through the vertex without intravenous contrast. COMPARISON:  09/10/2015 FINDINGS: Brain: Stable brain atrophy pattern and chronic white matter microvascular ischemic changes throughout the white matter. No acute intracranial hemorrhage, mass lesion, new infarction, midline shift, herniation, hydrocephalus, or extra-axial fluid collection. Cerebellar atrophy as well. Cisterns remain patent. Vascular: No hyperdense vessel or unexpected calcification. Skull:  Chronic lytic foci throughout the skull as before. Findings compatible with known myeloma. Negative for fracture or acute osseous finding. Sinuses/Orbits: No acute finding. Other: None. IMPRESSION: Stable atrophy and chronic white matter microvascular ischemic changes. No interval change or acute process by noncontrast CT. Electronically Signed   By: MJerilynn Mages  Shick M.D.   On: 07/30/2016 16:02   Dg Knee Complete 4 Views Right  Result Date: 07/28/2016 CLINICAL DATA:  Right knee pain, fall EXAM: RIGHT KNEE - COMPLETE 4+ VIEW COMPARISON:  03/29/2010 FINDINGS: Four views of the right knee submitted. No acute fracture or subluxation. There is significant narrowing of medial joint compartment. Spurring of medial femoral condyle and medial tibial plateau. Narrowing of patellofemoral joint space. Atherosclerotic calcifications of femoral and popliteal artery. IMPRESSION: No acute fracture or subluxation. Osteoarthritic changes with progression from prior exam. Electronically Signed   By: LLahoma CrockerM.D.   On: 07/28/2016 15:32    Microbiology: Recent Results (from the past 240 hour(s))  Culture, Urine  Status: Abnormal   Collection Time: 07/30/16  2:50 PM  Result Value Ref Range Status   Specimen Description URINE, CLEAN CATCH  Final   Special Requests NONE  Final   Culture (A)  Final    <10,000 COLONIES/mL INSIGNIFICANT GROWTH Performed at [name]    Report Status 07/31/2016 FINAL  Final     Labs: Basic Metabolic Panel:  Recent Labs Lab 07/30/16 0532 07/30/16 0930 07/31/16 0617 08/01/16 0857 08/02/16 0606 08/03/16 0552  NA 142  --  143 140 142 139  K 3.3*  --  3.4* 3.7 3.6 3.6  CL 111  --  114* 110 108 110  CO2 23  --  21* 21* 25 22  GLUCOSE 103*  --  104* 173* 95 90  BUN 24*  --  _0 23*  CREATININE 1.32*  --  1.19* 1.36* 1.28* 1.44*  CALCIUM 12.7* 12.3* 11.6* 11.8* 12.0* 10.9*  MG  --  1.3* 2.2  --   --   --   PHOS  --  2.3* 3.6  --   --   --    Liver Function  Tests:  Recent Labs Lab 07/28/16 1356 07/29/16 1054  AST 80* 88*  ALT 19 20  ALKPHOS 64 66  BILITOT 0.2 0.4  PROT 7.0 7.1  ALBUMIN 4.1 4.2   No results for input(s): LIPASE, AMYLASE in the last 168 hours. No results for input(s): AMMONIA in the last 168 hours. CBC:  Recent Labs Lab 07/28/16 1356 07/29/16 1054 07/30/16 0532 07/31/16 0617 08/01/16 0857 08/02/16 0606 08/03/16 0626  WBC 6.6 7.1 7.0 7.3 6.6 7.1 6.3  NEUTROABS 4.2 4.6  --   --   --   --   --   HGB 6.9 Repeated and verified X2.* 7.0* 8.6* 8.8* 9.1* 8.8* 8.1*  HCT 21.3 Repeated and verified X2.* 21.8* 26.0* 26.3* 27.6* 26.9* 25.3*  MCV 82.8 84.2 82.5 82.7 82.1 83.5 85.5  PLT 217.0 220 188 172 165 182 167   Cardiac Enzymes: No results for input(s): CKTOTAL, CKMB, CKMBINDEX, TROPONINI in the last 168 hours. BNP: BNP (last 3 results) No results for input(s): BNP in the last 8760 hours.  ProBNP (last 3 results) No results for input(s): PROBNP in the last 8760 hours.  CBG:  Recent Labs Lab 08/02/16 1139 08/02/16 1705 08/02/16 2156 08/03/16 0716 08/03/16 1144  GLUCAP 91 102* 93 91 95       Signed:  THOMPSON,DANIEL MD.  [facility] 08/03/2016, 12:34 PM

## 2016-08-03 NOTE — Progress Notes (Addendum)
ANTICOAGULATION CONSULT NOTE - Follow-Up Consult  Pharmacy Consult for Warfarin Indication: history of DVT  No Known Allergies  Patient Measurements: Weight: 101 lb 13.6 oz (46.2 kg) Height: 5'2" Weight (as of 06/29/16): 47.6 kg  Vital Signs: Temp: 99.2 F (37.3 C) (12/03 0458) Temp Source: Oral (12/03 0458) BP: 115/52 (12/03 0458) Pulse Rate: 72 (12/03 0458)  Labs:  Recent Labs  08/01/16 0857 08/02/16 0606 08/03/16 0552 08/03/16 0626  HGB 9.1* 8.8*  --  8.1*  HCT 27.6* 26.9*  --  25.3*  PLT 165 182  --  167  LABPROT 23.0* 20.4*  --  18.5*  INR 2.01 1.72  --  1.53  CREATININE 1.36* 1.28* 1.44*  --     Estimated Creatinine Clearance: 23.5 mL/min (by C-G formula based on SCr of 1.44 mg/dL (H)).   Assessment:  80 year female with PMH significant for multiple myeloma, DM, and bilateral LE DVT (was on warfarin PTA), presented with c/o weakness and decreased eating/drinking over the past few days.  Sent to ED from PCP with noted elevated calcium and protime.  To be admitted for hypercalcemia, anemia.  Pharmacy consulted to manage warfarin therapy  PTA warfarin regimen = 36m daily with last dose reported as taken on 07/28/16 @ 17:30  INR upon admission (07/28/16) = 3.8  Today, 08/03/16:  INR SUBtherapeutic and trending down.  Warfarin was held on 11/28 and 11/29,   11/30:  Warfarin 326mgiven  12/1:  Warfarin 6m44mO x 1 ordered but dose not charted as given.    12/2: Warfarin 6mg81mven in afternoon since dose missed 12/1.  Higher dose not given due to recent supratherapeutic INR  Hgb remains decreased, s/p 1 unit PRBCs on 11/28, Pltc WNL.  No overt bleeding reported according to chart note.  Diet: Carb modified - tolerating  CT head done 11/29 because of confusion: no acute process or interval change noted.  Goal of Therapy:  INR 2-3   Plan:   Warfarin 7.6mg 9mx 1 now prior to possible discharge  Enoxaparin 50mg 76m24h  (if discharged will provide at  least 24h coverage based on CrCl).    If discharged, no more warfarin tonight, take warfarin 7.6mg (16mtabs of 6mg) on53mnday 12/4.  INR follow-up on Tuesday 12/5 for further warfarin dosing  If not able to get INR 12/5, take warfarin 6mg dail80mtarting Tuesday 12/5  Daily INR while in hospital  Dustin ZeDoreene Eland BCPS.   Pager: [phone] 8:35 AM

## 2016-08-03 NOTE — Care Management Important Message (Signed)
Important Message  Patient Details  Name: [name] MRN: [id number] Date of Birth: [birth year]   Medicare Important Message Given:  Yes    Erenest Rasher, RN 08/03/2016, 10:59 AM

## 2016-08-03 NOTE — Progress Notes (Signed)
Patient discharged home.  Leaving with personal belongings and prescriptions.  Family reports understanding of discharge instructions.  Room air, denies pain, no s/s of distress.  Accompanied by family.  Home health ordered.  No complaints.

## 2016-08-04 DIAGNOSIS — Z86718 Personal history of other venous thrombosis and embolism: Secondary | ICD-10-CM | POA: Diagnosis not present

## 2016-08-04 DIAGNOSIS — Z89512 Acquired absence of left leg below knee: Secondary | ICD-10-CM | POA: Diagnosis not present

## 2016-08-04 DIAGNOSIS — I429 Cardiomyopathy, unspecified: Secondary | ICD-10-CM | POA: Diagnosis not present

## 2016-08-04 DIAGNOSIS — I10 Essential (primary) hypertension: Secondary | ICD-10-CM | POA: Diagnosis not present

## 2016-08-04 DIAGNOSIS — I872 Venous insufficiency (chronic) (peripheral): Secondary | ICD-10-CM | POA: Diagnosis not present

## 2016-08-04 DIAGNOSIS — Z79891 Long term (current) use of opiate analgesic: Secondary | ICD-10-CM | POA: Diagnosis not present

## 2016-08-04 DIAGNOSIS — C9000 Multiple myeloma not having achieved remission: Secondary | ICD-10-CM | POA: Diagnosis not present

## 2016-08-04 DIAGNOSIS — R296 Repeated falls: Secondary | ICD-10-CM | POA: Diagnosis not present

## 2016-08-04 DIAGNOSIS — E785 Hyperlipidemia, unspecified: Secondary | ICD-10-CM | POA: Diagnosis not present

## 2016-08-04 DIAGNOSIS — E0952 Drug or chemical induced diabetes mellitus with diabetic peripheral angiopathy with gangrene: Secondary | ICD-10-CM | POA: Diagnosis not present

## 2016-08-04 DIAGNOSIS — Z7901 Long term (current) use of anticoagulants: Secondary | ICD-10-CM | POA: Diagnosis not present

## 2016-08-04 DIAGNOSIS — Z9181 History of falling: Secondary | ICD-10-CM | POA: Diagnosis not present

## 2016-08-04 DIAGNOSIS — D649 Anemia, unspecified: Secondary | ICD-10-CM | POA: Diagnosis not present

## 2016-08-04 DIAGNOSIS — Z7984 Long term (current) use of oral hypoglycemic drugs: Secondary | ICD-10-CM | POA: Diagnosis not present

## 2016-08-04 DIAGNOSIS — Z87440 Personal history of urinary (tract) infections: Secondary | ICD-10-CM | POA: Diagnosis not present

## 2016-08-05 ENCOUNTER — Ambulatory Visit (INDEPENDENT_AMBULATORY_CARE_PROVIDER_SITE_OTHER): Payer: Medicare Other

## 2016-08-05 ENCOUNTER — Ambulatory Visit (INDEPENDENT_AMBULATORY_CARE_PROVIDER_SITE_OTHER): Payer: Medicare Other | Admitting: Primary Care

## 2016-08-05 ENCOUNTER — Encounter: Payer: Self-pay | Admitting: Primary Care

## 2016-08-05 VITALS — BP 130/76 | HR 84 | Temp 97.7°F

## 2016-08-05 DIAGNOSIS — M79674 Pain in right toe(s): Secondary | ICD-10-CM | POA: Diagnosis not present

## 2016-08-05 DIAGNOSIS — D649 Anemia, unspecified: Secondary | ICD-10-CM

## 2016-08-05 DIAGNOSIS — I82401 Acute embolism and thrombosis of unspecified deep veins of right lower extremity: Secondary | ICD-10-CM | POA: Diagnosis not present

## 2016-08-05 LAB — CBC
HCT: 24.4 % — ABNORMAL LOW (ref 36.0–46.0)
MCHC: 33.5 g/dL (ref 30.0–36.0)
MCV: 82.2 fl (ref 78.0–100.0)
Platelets: 189 10*3/uL (ref 150.0–400.0)
RBC: 2.97 Mil/uL — ABNORMAL LOW (ref 3.87–5.11)
RDW: 17.5 % — AB (ref 11.5–15.5)
WBC: 6.4 10*3/uL (ref 4.0–10.5)

## 2016-08-05 LAB — BASIC METABOLIC PANEL
BUN: 36 mg/dL — ABNORMAL HIGH (ref 6–23)
CO2: 23 mEq/L (ref 19–32)
CREATININE: 2.34 mg/dL — AB (ref 0.40–1.20)
Calcium: 11.5 mg/dL — ABNORMAL HIGH (ref 8.4–10.5)
Chloride: 110 mEq/L (ref 96–112)
GFR: 25.85 mL/min — ABNORMAL LOW (ref >60.00)
Glucose, Bld: 86 mg/dL (ref 70–99)
POTASSIUM: 4.4 meq/L (ref 3.5–5.1)
SODIUM: 144 meq/L (ref 135–145)

## 2016-08-05 LAB — URIC ACID: URIC ACID, SERUM: 8.9 mg/dL — AB (ref 2.4–7.0)

## 2016-08-05 LAB — POCT INR: INR: 3

## 2016-08-05 NOTE — Progress Notes (Signed)
Pre visit review using our clinic review tool, if applicable. No additional management support is needed unless otherwise documented below in the visit note. 

## 2016-08-05 NOTE — Patient Instructions (Signed)
Stop by and speak with Leafy Ro, our coumadin nurse, before you leave.  Complete lab work prior to leaving today.   Follow up with Dr. Inda Merlin and Dr. Scot Dock as scheduled.  We will be in touch with you once we receive your lab results.  It was a pleasure meeting you!

## 2016-08-05 NOTE — Patient Instructions (Signed)
Pre visit review using our clinic review tool, if applicable. No additional management support is needed unless otherwise documented below in the visit note. 

## 2016-08-05 NOTE — Progress Notes (Signed)
Subjective:    Patient ID: [name], female    DOB: [birth year], 80 y.o.   MRN: [id number]  HPI  [name] is a 80 year old female with a history of hypertension, type 2 diabetes, multiple myeloma, DVT on chronic anticoagulation who presents today for hospital follow up.  She presented to [facility] on 07/29/16 with a chief complaint of weakness, confusion, and decreased appetite. She was sent to the ED from her PCP for further evaluation of hypercalcemia (14.4) and elevation in PT (51.5). During her stay in the ED she underwent testing including CBC (anemia), BMP (hypercalcemia), ECG (baseline for patient), guaiac stool testing (positive). She was admitted for further evaluation, treatment, and oncology consult.  During her hospital stay she underwent transfusion of PRBC for anemia; treated with IV fluids, IV lasix, and one dose of IV pamidronate. She also underwent CT head (negative), UA (Negative). Confusion was thought to be caused by acute encephalopathy secondary to hypercalcemia and dehydration. Mental status and hemoglobin improved; and calcium and INR levels declined during hospital course. She was discharged home on 08/03/16 with recommendations for PCP follow up (repeat INR, CBC, BMP), Oncology follow up (Dr. Earlie Server).  Since her discharge home she's scheduled a follow up appointment with Dr. Inda Merlin (oncology) on Monday 08/11/16 with labs on 12/07, and vein and vascular on 12/20 (ABI's), and 12/27 (Dr. Scot Dock). She's been taking 7.5 mg of her coumadin daily since discharge. She continues to experience weakness and decrease in appetite. She's eating 1 full meal daily on average. She also reports right great toe pain that has been constant since October 2017. She denies swelling, erythema. The toe is sensitive to touch. She denies nausea, vomiting, fevers. Her daughter denies changes in mental status.  Review of Systems  Constitutional: Positive for appetite change and fatigue. Negative  for fever.  Respiratory: Negative for shortness of breath.   Cardiovascular: Negative for chest pain.  Gastrointestinal: Negative for blood in stool.  Musculoskeletal: Positive for arthralgias.  Neurological: Positive for weakness.  Psychiatric/Behavioral: Negative for confusion.       Past Medical History:  Diagnosis Date  . Anemia   . Anxiety    takes Xanax daily as needed  . Blood transfusion   . Cardiomyopathy ([place])   . Deep vein thrombosis of bilateral lower extremities (HCC)   . Degenerative joint disease   . Depression    Mild  . Diabetes mellitus    takes Glipizide daily  . Family history of anesthesia complication    DAUGHTER CPR AFTER  . GERD (gastroesophageal reflux disease)    takes Protonix daily  . Heart murmur   . History of chicken pox   . History of radiation therapy 08/30/13-09/20/13   20 gray to lower lumbar/upper sacrum  . History of shingles   . Hyperlipidemia   . Hypertension    takes Amlodipine,HCTZ,and Lisinopril daily  . Multiple myeloma    per Dr. Julien Nordmann, s/p palliative radiation for leg pain 2011 per Dr. Sondra Come  . Phlebitis   . UTI (urinary tract infection) 09/08/2013     Social History   Social History  . Marital status: Widowed    Spouse name: N/A  . Number of children: N/A  . Years of education: N/A   Occupational History  . Retired Retired   Social History Main Topics  . Smoking status: Former Smoker    Packs/day: 1.00    Years: 13.00    Quit date: 09/01/1973  . Smokeless tobacco:  Never Used     Comment: QIUT MANY YEARS AGO  . Alcohol use No  . Drug use: No  . Sexual activity: No   Other Topics Concern  . Not on file   Social History Narrative   Regular exercise:  Yes    Past Surgical History:  Procedure Laterality Date  . ABDOMINAL HYSTERECTOMY    . AMPUTATION Left 10/09/2015   Procedure: Left Leg AMPUTATION BELOW KNEE;  Surgeon: Angelia Mould, MD;  Location: [place];  Service: Vascular;  Laterality: Left;  .  ANKLE FRACTURE SURGERY Left   . APPLICATION OF WOUND VAC Left 11/16/2015   Procedure: APPLICATION OF WOUND VAC LEFT BELOW THE KNEE AMPUTEE.;  Surgeon: Angelia Mould, MD;  Location: [place];  Service: Vascular;  Laterality: Left;  . COLONOSCOPY    . EAR CYST EXCISION Left 04/16/2015   Procedure: EXCISION FACIAL CYST LEFT SIDE ;  Surgeon: Izora Gala, MD;  Location: [place];  Service: ENT;  Laterality: Left;  . EYE SURGERY Bilateral    cataract surgery  . I&D EXTREMITY Left 11/16/2015   Procedure: DEBRIDEMENT OF LEFT  BELOW KNEE AMPUTATION ;  Surgeon: Angelia Mould, MD;  Location: [place];  Service: Vascular;  Laterality: Left;  . PERIPHERAL VASCULAR CATHETERIZATION  08/14/2015   Procedure: Lower Extremity Intervention;  Surgeon: Katha Cabal, MD;  Location: [place] [facility];  Service: Cardiovascular;;  . PERIPHERAL VASCULAR CATHETERIZATION N/A 08/14/2015   Procedure: Abdominal Aortogram w/Lower Extremity;  Surgeon: Katha Cabal, MD;  Location: [place] [facility];  Service: Cardiovascular;  Laterality: N/A;    Family History  Problem Relation Age of Onset  . Arthritis Other     Parents  . Cancer Other     Colon, 1st degree relative <60  . Diabetes Other     Parents  . Stroke Other     1st degree relative <60  . Diabetes Mother   . Hypertension Mother   . Arthritis Mother   . Stroke Mother   . Diabetes Father   . Hypertension Father   . Arthritis Father   . Stroke Father   . Cancer Sister     stomach  . Stroke Brother   . Cancer Brother     prostate <50  . Stroke Daughter   . Stroke Son     No Known Allergies  Current Outpatient Prescriptions on File Prior to Visit  Medication Sig Dispense Refill  . acetaminophen (TYLENOL) 500 MG tablet Take 500 mg by mouth every 6 (six) hours as needed for mild pain.    Marland Kitchen ALPRAZolam (XANAX) 0.5 MG tablet Take 1 tablet (0.5 mg total) by mouth 2 (two) times daily as needed. for anxiety 60 tablet 2   . amLODipine (NORVASC) 5 MG tablet Take 1 tablet (5 mg total) by mouth daily. 30 tablet 5  . diclofenac sodium (VOLTAREN) 1 % GEL Apply 2 g topically 2 (two) times daily as needed (for shoulder pain). 100 g 1  . glipiZIDE (GLUCOTROL XL) 2.5 MG 24 hr tablet Take 1 tablet (2.5 mg total) by mouth daily. 30 tablet 5  . glucose blood (ONE TOUCH ULTRA TEST) test strip One touch ultra 2 test strips.  Use daily.  Dx e11.9. 100 each 3  . hydrochlorothiazide (MICROZIDE) 12.5 MG capsule Take 1 capsule (12.5 mg total) by mouth daily. 30 capsule 5  . lisinopril (PRINIVIL,ZESTRIL) 5 MG tablet Take 1 tablet (5 mg total) by mouth daily.  30 tablet 5  . mirtazapine (REMERON) 7.5 MG tablet Take 1 tablet (7.5 mg total) by mouth at bedtime as needed. 15 tablet 0  . ONE TOUCH LANCETS MISC USE TO CHECK BLOOD SUGAR TWO TIMES DAILY.    DX:  E11.9 200 each 3  . pantoprazole (PROTONIX) 40 MG tablet Take 1 tablet (40 mg total) by mouth daily. 30 tablet 5  . potassium chloride SA (K-DUR,KLOR-CON) 20 MEQ tablet Take 1 tablet (20 mEq total) by mouth daily. 30 tablet 5  . traMADol (ULTRAM) 50 MG tablet 12m (1 tab) every 4 to 6 hours as needed for pain.  Sedation caution.    .Marland Kitchenwarfarin (COUMADIN) 5 MG tablet Take 1-1.5 tablets (5-7.5 mg total) by mouth daily. Take 1.5(7.558m tomorrow 08/05/2016, then per PCP. 20 tablet 0   Current Facility-Administered Medications on File Prior to Visit  Medication Dose Route Frequency Provider Last Rate Last Dose  . 0.9 %  sodium chloride infusion   Intravenous Once MoCurt BearsMD   Stopped at 03/21/15 1720  . 0.9 %  sodium chloride infusion   Intravenous Once MoCurt BearsMD      . 0.9 %  sodium chloride infusion   Intravenous Once MoCurt BearsMD   Stopped at 07/31/15 1638    BP 130/76   Pulse 84   Temp 97.7 F (36.5 C) (Oral)   SpO2 95%    Objective:   Physical Exam  Constitutional: She is oriented to person, place, and time.  Eyes: Pupils are equal, round, and  reactive to light.  Neck: Neck supple.  Cardiovascular: Normal rate and regular rhythm.   Murmur heard. Pulmonary/Chest: Effort normal and breath sounds normal.  Neurological: She is alert and oriented to person, place, and time.  Skin: Skin is warm and dry.  No erythema or swelling to right great toe. Toe sensitive to touch.          Assessment & Plan:  Hospital Follow Up:  [facility], dehydration, hypercalcemia, elevated INR. Underwent treatment with IV meds and fluids, evaluation with CT scan, UA, ECG. Ruled out CVA. Required PRBC for anemia, initial hemoglobin of 7. All labs including hemoglobin, calcium, PT/INR improved during hospital course. Mental status improved.   Overall doing well except for weakness and decreased appetite. No changes in mental status, falls, fevers. Her daughter is very organized and has her follow up appointments scheduled for vein and vascular and oncology. Patient looks well, alert and oriented, active in HPI. Will recheck CBC, BMP, INR today.  She met with coumadin nurse today, INR of 3.0 on 7.5 mg of coumadin. She will reduce back down to 5 mg, given history of elevation. Repeat INR in 1 week.  All hospital labs, imaging, notes reviewed. ClSheral FlowNP

## 2016-08-06 DIAGNOSIS — Z79891 Long term (current) use of opiate analgesic: Secondary | ICD-10-CM | POA: Diagnosis not present

## 2016-08-06 DIAGNOSIS — Z87440 Personal history of urinary (tract) infections: Secondary | ICD-10-CM | POA: Diagnosis not present

## 2016-08-06 DIAGNOSIS — C9000 Multiple myeloma not having achieved remission: Secondary | ICD-10-CM | POA: Diagnosis not present

## 2016-08-06 DIAGNOSIS — E785 Hyperlipidemia, unspecified: Secondary | ICD-10-CM | POA: Diagnosis not present

## 2016-08-06 DIAGNOSIS — E0952 Drug or chemical induced diabetes mellitus with diabetic peripheral angiopathy with gangrene: Secondary | ICD-10-CM | POA: Diagnosis not present

## 2016-08-06 DIAGNOSIS — Z9181 History of falling: Secondary | ICD-10-CM | POA: Diagnosis not present

## 2016-08-06 DIAGNOSIS — R296 Repeated falls: Secondary | ICD-10-CM | POA: Diagnosis not present

## 2016-08-06 DIAGNOSIS — Z89512 Acquired absence of left leg below knee: Secondary | ICD-10-CM | POA: Diagnosis not present

## 2016-08-06 DIAGNOSIS — Z7984 Long term (current) use of oral hypoglycemic drugs: Secondary | ICD-10-CM | POA: Diagnosis not present

## 2016-08-06 DIAGNOSIS — Z7901 Long term (current) use of anticoagulants: Secondary | ICD-10-CM | POA: Diagnosis not present

## 2016-08-06 DIAGNOSIS — Z86718 Personal history of other venous thrombosis and embolism: Secondary | ICD-10-CM | POA: Diagnosis not present

## 2016-08-06 DIAGNOSIS — I429 Cardiomyopathy, unspecified: Secondary | ICD-10-CM | POA: Diagnosis not present

## 2016-08-06 DIAGNOSIS — D649 Anemia, unspecified: Secondary | ICD-10-CM | POA: Diagnosis not present

## 2016-08-06 DIAGNOSIS — I872 Venous insufficiency (chronic) (peripheral): Secondary | ICD-10-CM | POA: Diagnosis not present

## 2016-08-06 DIAGNOSIS — I10 Essential (primary) hypertension: Secondary | ICD-10-CM | POA: Diagnosis not present

## 2016-08-07 ENCOUNTER — Telehealth: Payer: Self-pay

## 2016-08-07 ENCOUNTER — Other Ambulatory Visit (HOSPITAL_BASED_OUTPATIENT_CLINIC_OR_DEPARTMENT_OTHER): Payer: Medicare Other

## 2016-08-07 ENCOUNTER — Other Ambulatory Visit: Payer: Self-pay | Admitting: Family Medicine

## 2016-08-07 ENCOUNTER — Other Ambulatory Visit: Payer: Self-pay | Admitting: Medical Oncology

## 2016-08-07 DIAGNOSIS — C9000 Multiple myeloma not having achieved remission: Secondary | ICD-10-CM | POA: Diagnosis not present

## 2016-08-07 DIAGNOSIS — Z7901 Long term (current) use of anticoagulants: Secondary | ICD-10-CM

## 2016-08-07 DIAGNOSIS — I825Z2 Chronic embolism and thrombosis of unspecified deep veins of left distal lower extremity: Secondary | ICD-10-CM

## 2016-08-07 LAB — COMPREHENSIVE METABOLIC PANEL
ALBUMIN: 3.4 g/dL — AB (ref 3.5–5.0)
ALK PHOS: 77 U/L (ref 40–150)
ALT: 13 U/L (ref 0–55)
AST: 98 U/L — ABNORMAL HIGH (ref 5–34)
Anion Gap: 14 mEq/L — ABNORMAL HIGH (ref 3–11)
BUN: 29.7 mg/dL — ABNORMAL HIGH (ref 7.0–26.0)
CALCIUM: 12.2 mg/dL — AB (ref 8.4–10.4)
CO2: 20 mEq/L — ABNORMAL LOW (ref 22–29)
Chloride: 113 mEq/L — ABNORMAL HIGH (ref 98–109)
Creatinine: 2.3 mg/dL — ABNORMAL HIGH (ref 0.6–1.1)
EGFR: 23 mL/min/{1.73_m2} — AB (ref >90)
Glucose: 63 mg/dl — ABNORMAL LOW (ref 70–140)
POTASSIUM: 4 meq/L (ref 3.5–5.1)
Sodium: 147 mEq/L — ABNORMAL HIGH (ref 136–145)
TOTAL PROTEIN: 7.2 g/dL (ref 6.4–8.3)

## 2016-08-07 LAB — CBC WITH DIFFERENTIAL/PLATELET
BASO%: 0.5 % (ref 0.0–2.0)
BASOS ABS: 0 10*3/uL (ref 0.0–0.1)
EOS ABS: 0.1 10*3/uL (ref 0.0–0.5)
EOS%: 1.1 % (ref 0.0–7.0)
HCT: 25 % — ABNORMAL LOW (ref 34.8–46.6)
HGB: 8 g/dL — ABNORMAL LOW (ref 11.6–15.9)
LYMPH#: 1.3 10*3/uL (ref 0.9–3.3)
LYMPH%: 22.7 % (ref 14.0–49.7)
MCH: 26.9 pg (ref 25.1–34.0)
MCHC: 32.1 g/dL (ref 31.5–36.0)
MCV: 83.7 fL (ref 79.5–101.0)
MONO#: 0.5 10*3/uL (ref 0.1–0.9)
MONO%: 9.4 % (ref 0.0–14.0)
NEUT#: 3.8 10*3/uL (ref 1.5–6.5)
NEUT%: 66.3 % (ref 38.4–76.8)
PLATELETS: 182 10*3/uL (ref 145–400)
RBC: 2.99 10*6/uL — ABNORMAL LOW (ref 3.70–5.45)
RDW: 17.2 % — ABNORMAL HIGH (ref 11.2–14.5)
WBC: 5.8 10*3/uL (ref 3.9–10.3)

## 2016-08-07 LAB — PROTIME-INR
INR: 4.4 — AB (ref 2.00–3.50)
Protime: 52.8 Seconds — ABNORMAL HIGH (ref 10.6–13.4)

## 2016-08-07 LAB — LACTATE DEHYDROGENASE: LDH: 1148 U/L — ABNORMAL HIGH (ref 125–245)

## 2016-08-07 LAB — TECHNOLOGIST REVIEW

## 2016-08-07 NOTE — Telephone Encounter (Signed)
Patient's daughter calls in to report that her INR at [facility] center today was 4.4.  She has been taking '5mg'$  daily since visit on 08/05/16.    I instructed patient's daughter, [name], to hold coumadin today and tomorrow.  Dr. [name] has sent a message to Dr. Julien Nordmann for clarification as to whether he would like to follow coumadin or have her continue with our coumadin clinic here.  I will follow up with further instructions for patient's daughter tomorrow.

## 2016-08-08 DIAGNOSIS — I10 Essential (primary) hypertension: Secondary | ICD-10-CM | POA: Diagnosis not present

## 2016-08-08 DIAGNOSIS — R296 Repeated falls: Secondary | ICD-10-CM | POA: Diagnosis not present

## 2016-08-08 DIAGNOSIS — Z86718 Personal history of other venous thrombosis and embolism: Secondary | ICD-10-CM | POA: Diagnosis not present

## 2016-08-08 DIAGNOSIS — E0952 Drug or chemical induced diabetes mellitus with diabetic peripheral angiopathy with gangrene: Secondary | ICD-10-CM | POA: Diagnosis not present

## 2016-08-08 DIAGNOSIS — I872 Venous insufficiency (chronic) (peripheral): Secondary | ICD-10-CM | POA: Diagnosis not present

## 2016-08-08 DIAGNOSIS — I429 Cardiomyopathy, unspecified: Secondary | ICD-10-CM | POA: Diagnosis not present

## 2016-08-08 DIAGNOSIS — Z87440 Personal history of urinary (tract) infections: Secondary | ICD-10-CM | POA: Diagnosis not present

## 2016-08-08 DIAGNOSIS — Z89512 Acquired absence of left leg below knee: Secondary | ICD-10-CM | POA: Diagnosis not present

## 2016-08-08 DIAGNOSIS — Z79891 Long term (current) use of opiate analgesic: Secondary | ICD-10-CM | POA: Diagnosis not present

## 2016-08-08 DIAGNOSIS — D649 Anemia, unspecified: Secondary | ICD-10-CM | POA: Diagnosis not present

## 2016-08-08 DIAGNOSIS — E785 Hyperlipidemia, unspecified: Secondary | ICD-10-CM | POA: Diagnosis not present

## 2016-08-08 DIAGNOSIS — C9000 Multiple myeloma not having achieved remission: Secondary | ICD-10-CM | POA: Diagnosis not present

## 2016-08-08 DIAGNOSIS — Z9181 History of falling: Secondary | ICD-10-CM | POA: Diagnosis not present

## 2016-08-08 DIAGNOSIS — Z7984 Long term (current) use of oral hypoglycemic drugs: Secondary | ICD-10-CM | POA: Diagnosis not present

## 2016-08-08 DIAGNOSIS — Z7901 Long term (current) use of anticoagulants: Secondary | ICD-10-CM | POA: Diagnosis not present

## 2016-08-08 LAB — IGG, IGA, IGM
IGG (IMMUNOGLOBIN G), SERUM: 389 mg/dL — AB (ref 700–1600)
IGM (IMMUNOGLOBIN M), SRM: 13 mg/dL — AB (ref 26–217)
IgA, Qn, Serum: 14 mg/dL — ABNORMAL LOW (ref 64–422)

## 2016-08-08 LAB — KAPPA/LAMBDA LIGHT CHAINS
IG KAPPA FREE LIGHT CHAIN: 6.4 mg/L (ref 3.3–19.4)
Ig Lambda Free Light Chain: 10674.4 mg/L — ABNORMAL HIGH (ref 5.7–26.3)
Kappa/Lambda FluidC Ratio: 0 — ABNORMAL LOW (ref 0.26–1.65)

## 2016-08-08 LAB — BETA 2 MICROGLOBULIN, SERUM: BETA 2: 13.1 mg/L — AB (ref 0.6–2.4)

## 2016-08-08 NOTE — Telephone Encounter (Signed)
Spoke with patient's daughter, [name], this pm.  She has not heard from Dr. Worthy Flank office today about how to take coumadin this weekend.  But, Dr. Julien Nordmann did send note that he would resume management on Monday.  I spoke with daughter and asked her to have her mother take 2.'5mg'$  of Coumadin Saturday and Sunday and follow up with Dr. Julien Nordmann for further management on Monday.  Daughter verbalizes understanding.  She will continue to have her mother hold the dose today per my instructions yesterday.

## 2016-08-10 NOTE — Telephone Encounter (Signed)
Thanks

## 2016-08-11 ENCOUNTER — Telehealth: Payer: Self-pay | Admitting: Medical Oncology

## 2016-08-11 ENCOUNTER — Other Ambulatory Visit: Payer: Self-pay | Admitting: Medical Oncology

## 2016-08-11 ENCOUNTER — Encounter: Payer: Self-pay | Admitting: Internal Medicine

## 2016-08-11 ENCOUNTER — Ambulatory Visit (HOSPITAL_BASED_OUTPATIENT_CLINIC_OR_DEPARTMENT_OTHER): Payer: Medicare Other

## 2016-08-11 ENCOUNTER — Other Ambulatory Visit: Payer: Medicare Other

## 2016-08-11 ENCOUNTER — Ambulatory Visit (HOSPITAL_BASED_OUTPATIENT_CLINIC_OR_DEPARTMENT_OTHER): Payer: Medicare Other | Admitting: Internal Medicine

## 2016-08-11 ENCOUNTER — Telehealth: Payer: Self-pay | Admitting: Internal Medicine

## 2016-08-11 VITALS — BP 121/58 | HR 75 | Temp 98.4°F | Resp 18 | Ht 62.0 in | Wt 100.1 lb

## 2016-08-11 DIAGNOSIS — I739 Peripheral vascular disease, unspecified: Secondary | ICD-10-CM | POA: Diagnosis not present

## 2016-08-11 DIAGNOSIS — C9002 Multiple myeloma in relapse: Secondary | ICD-10-CM

## 2016-08-11 DIAGNOSIS — I825Z2 Chronic embolism and thrombosis of unspecified deep veins of left distal lower extremity: Secondary | ICD-10-CM | POA: Diagnosis not present

## 2016-08-11 DIAGNOSIS — C9000 Multiple myeloma not having achieved remission: Secondary | ICD-10-CM

## 2016-08-11 DIAGNOSIS — Z7901 Long term (current) use of anticoagulants: Secondary | ICD-10-CM | POA: Diagnosis not present

## 2016-08-11 DIAGNOSIS — Z5111 Encounter for antineoplastic chemotherapy: Secondary | ICD-10-CM

## 2016-08-11 LAB — PROTIME-INR
INR: 4.6 — AB (ref 0.8–1.2)
INR: 2.8 (ref 2.00–3.50)
Prothrombin Time: 44.2 s — ABNORMAL HIGH (ref 9.1–12.0)
Protime: 33.6 Seconds — ABNORMAL HIGH (ref 10.6–13.4)

## 2016-08-11 MED ORDER — DEXAMETHASONE 4 MG PO TABS: ORAL_TABLET | ORAL | 3 refills | Status: DC

## 2016-08-11 MED ORDER — PROCHLORPERAZINE MALEATE 10 MG PO TABS: 10.0000 mg | ORAL_TABLET | Freq: Four times a day (QID) | ORAL | 0 refills | Status: AC | PRN

## 2016-08-11 NOTE — Progress Notes (Signed)
His is a very pleasant     [place] [phone]   Fax:([phone]) [place], MD [place] [address]  DIAGNOSIS:  1) multiple myeloma diagnosed in September 2009. 2) history of bilateral lower extremity deep venous thrombosis in November 2009 as well as October 2010. 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   PRIOR THERAPY: 1. status post palliative radiotherapy to the right femur and ischail tuberosity, first was done in December 2011 and the second treatment was completed Jan 08 2011, and the third treatment to the right distal femur completed on 04/30/2011  2. status post palliative radiotherapy to the left proximal femur completed 02/28/2011.  3. Revlimid 25 mg by mouth daily for 21 days every 4 weeks in addition to oral Decadron at 40 mg by mouth on a weekly basis status post 40 cycles.  4. weekly subcutaneous Velcade 1.3 mg/M2 status post 60 cycles, discontinued today secondary to mild disease progression. 5. Pomalyst 4 mg by mouth daily for 21 days every 4 weeks, the patient started the first dose on 12/22/2012 , in addition to Decadron 40 mg weekly. Status post 13 cycles.  6. Chemotherapy according to the [place] [id number] expanded access treatment protocol with Elotuzumab, lenalidomide and dexamthasone. Status post 5 cycles, discontinued today secondary to disease progression.  7. Systemic chemotherapy with Carfilzomib, Cytoxan and Decadron. First cycle 09/05/2014. Status post 12 cycles. 8. Left below knee amputation for gangrene of the left foot on 10/09/2015 under the care of Dr. Scot Dock.  CURRENT THERAPY: 1. Systemic chemotherapy again with Carfilzomib, Cytoxan and Decadron. She was treated in the past was 13 cycles. She will resume cycle #15 on 08/18/2016 2. Coumadin 3. Zometa 4 mg IV every 3 months.  INTERVAL HISTORY: [name] 80  y.o. female returns to the clinic today for follow-up visit. The patient was last seen in December 2016. The patient came to the clinic today accompanied by her son and 2 daughters. She was not seen for almost a year. During that time she underwent left below knee amputation for gangrene of the left on 10/09/2015. She had several hospitalizations during that time. She was in a skilled nursing facility for rehabilitation she missed several of her previous appointments. She has been off chemotherapy for almost 12 months. [facility] as well as hypercalcemia of malignancy. She continues to complain of increasing fatigue and weakness. She has intermittent abdominal pain. She has no fever or chills. She has no nausea or vomiting. She had repeat myeloma panel performed last week and she is here for evaluation and discussion of her lab results and treatment options.  MEDICAL HISTORY: Past Medical History:  Diagnosis Date  . Anemia   . Anxiety    takes Xanax daily as needed  . Blood transfusion   . Cardiomyopathy ([place])   . Deep vein thrombosis of bilateral lower extremities (HCC)   . Degenerative joint disease   . Depression    Mild  . Diabetes mellitus    takes Glipizide daily  . Family history of anesthesia complication    DAUGHTER CPR AFTER  . GERD (gastroesophageal reflux disease)    takes Protonix daily  . Heart murmur   . History of chicken pox   . History of radiation therapy 08/30/13-09/20/13   20 gray to lower lumbar/upper sacrum  . History  of shingles   . Hyperlipidemia   . Hypertension    takes Amlodipine,HCTZ,and Lisinopril daily  . Multiple myeloma    per Dr. Julien Nordmann, s/p palliative radiation for leg pain 2011 per Dr. Sondra Come  . Phlebitis   . UTI (urinary tract infection) 09/08/2013    ALLERGIES:  has No Known Allergies.  MEDICATIONS:  Current Outpatient Prescriptions  Medication Sig Dispense Refill  . acetaminophen  (TYLENOL) 500 MG tablet Take 500 mg by mouth every 6 (six) hours as needed for mild pain.    Marland Kitchen ALPRAZolam (XANAX) 0.5 MG tablet Take 1 tablet (0.5 mg total) by mouth 2 (two) times daily as needed. for anxiety 60 tablet 2  . amLODipine (NORVASC) 5 MG tablet Take 1 tablet (5 mg total) by mouth daily. 30 tablet 5  . diclofenac sodium (VOLTAREN) 1 % GEL Apply 2 g topically 2 (two) times daily as needed (for shoulder pain). 100 g 1  . glipiZIDE (GLUCOTROL XL) 2.5 MG 24 hr tablet Take 1 tablet (2.5 mg total) by mouth daily. 30 tablet 5  . glucose blood (ONE TOUCH ULTRA TEST) test strip One touch ultra 2 test strips.  Use daily.  Dx e11.9. 100 each 3  . hydrochlorothiazide (MICROZIDE) 12.5 MG capsule Take 1 capsule (12.5 mg total) by mouth daily. 30 capsule 5  . lisinopril (PRINIVIL,ZESTRIL) 5 MG tablet Take 1 tablet (5 mg total) by mouth daily. 30 tablet 5  . mirtazapine (REMERON) 7.5 MG tablet Take 1 tablet (7.5 mg total) by mouth at bedtime as needed. 15 tablet 0  . ONE TOUCH LANCETS MISC USE TO CHECK BLOOD SUGAR TWO TIMES DAILY.    DX:  E11.9 200 each 3  . pantoprazole (PROTONIX) 40 MG tablet Take 1 tablet (40 mg total) by mouth daily. 30 tablet 5  . potassium chloride SA (K-DUR,KLOR-CON) 20 MEQ tablet Take 1 tablet (20 mEq total) by mouth daily. 30 tablet 5  . traMADol (ULTRAM) 50 MG tablet 12m (1 tab) every 4 to 6 hours as needed for pain.  Sedation caution.    .Marland Kitchenwarfarin (COUMADIN) 5 MG tablet Take 1-1.5 tablets (5-7.5 mg total) by mouth daily. Take 1.5(7.577m tomorrow 08/05/2016, then per PCP. 20 tablet 0   No current facility-administered medications for this visit.    Facility-Administered Medications Ordered in Other Visits  Medication Dose Route Frequency Provider Last Rate Last Dose  . 0.9 %  sodium chloride infusion   Intravenous Once MoCurt BearsMD   Stopped at 03/21/15 1720  . 0.9 %  sodium chloride infusion   Intravenous Once MoCurt BearsMD      . 0.9 %  sodium chloride  infusion   Intravenous Once MoCurt BearsMD   Stopped at 07/31/15 1638    SURGICAL HISTORY:  Past Surgical History:  Procedure Laterality Date  . ABDOMINAL HYSTERECTOMY    . AMPUTATION Left 10/09/2015   Procedure: Left Leg AMPUTATION BELOW KNEE;  Surgeon: ChAngelia MouldMD;  [facility] Service: Vascular;  Laterality: Left;  . ANKLE FRACTURE SURGERY Left   . APPLICATION OF WOUND VAC Left 11/16/2015   Procedure: APPLICATION OF WOUND VAC LEFT BELOW THE KNEE AMPUTEE.;  Surgeon: ChAngelia MouldMD;  [facility] Service: Vascular;  Laterality: Left;  . COLONOSCOPY    . EAR CYST EXCISION Left 04/16/2015   Procedure: EXCISION FACIAL CYST LEFT SIDE ;  Surgeon: JeIzora GalaMD;  [facility] Service: ENT;  Laterality: Left;  .  EYE SURGERY Bilateral    cataract surgery  . I&D EXTREMITY Left 11/16/2015   Procedure: DEBRIDEMENT OF LEFT  BELOW KNEE AMPUTATION ;  Surgeon: Angelia Mould, MD;  Location: [place];  Service: Vascular;  Laterality: Left;  . PERIPHERAL VASCULAR CATHETERIZATION  08/14/2015   Procedure: Lower Extremity Intervention;  Surgeon: Katha Cabal, MD;  Location: [place] [facility];  Service: Cardiovascular;;  . PERIPHERAL VASCULAR CATHETERIZATION N/A 08/14/2015   Procedure: Abdominal Aortogram w/Lower Extremity;  Surgeon: Katha Cabal, MD;  Location: [place] [facility];  Service: Cardiovascular;  Laterality: N/A;    REVIEW OF SYSTEMS:  Constitutional: positive for anorexia, fatigue and weight loss Eyes: negative Ears, nose, mouth, throat, and face: negative Respiratory: negative Cardiovascular: negative Gastrointestinal: negative Genitourinary:negative Integument/breast: negative Hematologic/lymphatic: positive for bleeding and easy bruising Musculoskeletal:positive for arthralgias, back pain, bone pain and muscle weakness Neurological: negative Behavioral/Psych: negative Endocrine:  negative Allergic/Immunologic: negative   PHYSICAL EXAMINATION: General appearance: alert, cooperative, fatigued and no distress Head: Normocephalic, without obvious abnormality, atraumatic Neck: no adenopathy, no JVD, supple, symmetrical, trachea midline and thyroid not enlarged, symmetric, no tenderness/mass/nodules Lymph nodes: Cervical, supraclavicular, and axillary nodes normal. Resp: clear to auscultation bilaterally Back: symmetric, no curvature. ROM normal. No CVA tenderness. Cardio: regular rate and rhythm, S1, S2 normal, no murmur, click, rub or gallop GI: soft, non-tender; bowel sounds normal; no masses,  no organomegaly Extremities: Below-knee amputation of the left leg Neurologic: Alert and oriented X 3, normal strength and tone. Normal symmetric reflexes. Normal coordination and gait  ECOG PERFORMANCE STATUS: 1 - Symptomatic but completely ambulatory  There were no vitals taken for this visit.  LABORATORY DATA: Lab Results  Component Value Date   WBC 5.8 08/07/2016   HGB 8.0 (L) 08/07/2016   HCT 25.0 (L) 08/07/2016   MCV 83.7 08/07/2016   PLT 182 08/07/2016      Chemistry      Component Value Date/Time   NA 147 (H) 08/07/2016 1309   K 4.0 08/07/2016 1309   CL 110 08/05/2016 1348   CL 107 02/14/2013 1311   CO2 20 (L) 08/07/2016 1309   BUN 29.7 (H) 08/07/2016 1309   CREATININE 2.3 (H) 08/07/2016 1309      Component Value Date/Time   CALCIUM 12.2 (H) 08/07/2016 1309   ALKPHOS 77 08/07/2016 1309   AST 98 (H) 08/07/2016 1309   ALT 13 08/07/2016 1309   BILITOT <0.22 08/07/2016 1309       RADIOGRAPHIC STUDIES: Ct Head Wo Contrast  Result Date: 07/30/2016 CLINICAL DATA:  Generalized weakness, confusion, history of multiple myeloma EXAM: CT HEAD WITHOUT CONTRAST TECHNIQUE: Contiguous axial images were obtained from the base of the skull through the vertex without intravenous contrast. COMPARISON:  09/10/2015 FINDINGS: Brain: Stable brain atrophy pattern and  chronic white matter microvascular ischemic changes throughout the white matter. No acute intracranial hemorrhage, mass lesion, new infarction, midline shift, herniation, hydrocephalus, or extra-axial fluid collection. Cerebellar atrophy as well. Cisterns remain patent. Vascular: No hyperdense vessel or unexpected calcification. Skull: Chronic lytic foci throughout the skull as before. Findings compatible with known myeloma. Negative for fracture or acute osseous finding. Sinuses/Orbits: No acute finding. Other: None. IMPRESSION: Stable atrophy and chronic white matter microvascular ischemic changes. No interval change or acute process by noncontrast CT. Electronically Signed   By: Jerilynn Mages.  Shick M.D.   On: 07/30/2016 16:02   Dg Knee Complete 4 Views Right  Result Date: 07/28/2016 CLINICAL DATA:  Right knee pain, fall EXAM:  RIGHT KNEE - COMPLETE 4+ VIEW COMPARISON:  03/29/2010 FINDINGS: Four views of the right knee submitted. No acute fracture or subluxation. There is significant narrowing of medial joint compartment. Spurring of medial femoral condyle and medial tibial plateau. Narrowing of patellofemoral joint space. Atherosclerotic calcifications of femoral and popliteal artery. IMPRESSION: No acute fracture or subluxation. Osteoarthritic changes with progression from prior exam. Electronically Signed   By: Lahoma Crocker M.D.   On: 07/28/2016 15:32    ASSESSMENT AND PLAN:  This is a very pleasant 80 years old African-American female with multiple medical problems including: 1) relapsed multiple myeloma: The recent myeloma panel showed significant increase and the free lambda light chain and beta-2 microglobulin concerning for disease progression. The patient has been off treatment for the last year. I had a lengthy discussion with the patient and her family about her current disease status and treatment options. I recommended for the patient to resume her previous systemic therapy with Carfilzomib, Cytoxan  and Decadron. I discussed with them the adverse effects of this treatment including but not limited to alopecia, myelosuppression, nausea and vomiting, peripheral neuropathy, liver or renal dysfunction. She is expected to start the first cycle of this treatment on 08/18/2016. 2) history of peripheral vascular disease status post below knee amputation of the left leg. She would continue her routine follow-up visit and evaluation by her vascular surgeon. 3) anticoagulation: Her PT/INR were elevated recently. She has been off treatment for the last few days. I will repeat PT/INR today. I recommended for the patient to continue on Coumadin 5 mg by mouth daily except Monday and Thursday she will take 2.5 mg. We'll continue to adjust her dose as needed. 4) hypercalcemia of malignancy: I will resume her treatment with Zometa starting next week. I will call her pharmacy with prescription for Compazine 10 mg by mouth every 6 hours as needed for nausea in addition to Decadron 20 mg by mouth on a weekly basis with the chemotherapy. The patient voices understanding of current disease status and treatment options and is in agreement with the current care plan.  All questions were answered. The patient knows to call the clinic with any problems, questions or concerns. We can certainly see the patient much sooner if necessary.  Disclaimer: This note was dictated with voice recognition software. Similar sounding words can inadvertently be transcribed and may not be corrected upon review.

## 2016-08-11 NOTE — Telephone Encounter (Signed)
Pt had repeat Pt INR today . Note

## 2016-08-11 NOTE — Telephone Encounter (Signed)
Message sent to chemo scheduler to be added, per 08/11/16 los. Appointments will be scheduled after Chemo appointments are entered.  A copy of the appointment schedule was given to the patient, per 08/11/16 los.

## 2016-08-12 ENCOUNTER — Telehealth: Payer: Self-pay | Admitting: Internal Medicine

## 2016-08-12 DIAGNOSIS — Z7901 Long term (current) use of anticoagulants: Secondary | ICD-10-CM | POA: Diagnosis not present

## 2016-08-12 DIAGNOSIS — E0952 Drug or chemical induced diabetes mellitus with diabetic peripheral angiopathy with gangrene: Secondary | ICD-10-CM | POA: Diagnosis not present

## 2016-08-12 DIAGNOSIS — R296 Repeated falls: Secondary | ICD-10-CM | POA: Diagnosis not present

## 2016-08-12 DIAGNOSIS — Z79891 Long term (current) use of opiate analgesic: Secondary | ICD-10-CM | POA: Diagnosis not present

## 2016-08-12 DIAGNOSIS — D649 Anemia, unspecified: Secondary | ICD-10-CM | POA: Diagnosis not present

## 2016-08-12 DIAGNOSIS — I872 Venous insufficiency (chronic) (peripheral): Secondary | ICD-10-CM | POA: Diagnosis not present

## 2016-08-12 DIAGNOSIS — Z87440 Personal history of urinary (tract) infections: Secondary | ICD-10-CM | POA: Diagnosis not present

## 2016-08-12 DIAGNOSIS — E785 Hyperlipidemia, unspecified: Secondary | ICD-10-CM | POA: Diagnosis not present

## 2016-08-12 DIAGNOSIS — C9000 Multiple myeloma not having achieved remission: Secondary | ICD-10-CM | POA: Diagnosis not present

## 2016-08-12 DIAGNOSIS — Z9181 History of falling: Secondary | ICD-10-CM | POA: Diagnosis not present

## 2016-08-12 DIAGNOSIS — Z89512 Acquired absence of left leg below knee: Secondary | ICD-10-CM | POA: Diagnosis not present

## 2016-08-12 DIAGNOSIS — I10 Essential (primary) hypertension: Secondary | ICD-10-CM | POA: Diagnosis not present

## 2016-08-12 DIAGNOSIS — I429 Cardiomyopathy, unspecified: Secondary | ICD-10-CM | POA: Diagnosis not present

## 2016-08-12 DIAGNOSIS — Z86718 Personal history of other venous thrombosis and embolism: Secondary | ICD-10-CM | POA: Diagnosis not present

## 2016-08-12 DIAGNOSIS — Z7984 Long term (current) use of oral hypoglycemic drugs: Secondary | ICD-10-CM | POA: Diagnosis not present

## 2016-08-12 NOTE — Telephone Encounter (Signed)
I already told her during the visit. '5mg'$  qd except Monday and Thursday 2.5. Thank you.

## 2016-08-12 NOTE — Telephone Encounter (Signed)
Spoke with patient dtr [name] re next appointments for 12/18 and 12/19. [name] will get new schedule 12/18.   Per 12/11 los lab/fu in about 4 weeks 1/8.  Chemo days 1,2 - 8,9 - 15-16 every 4 weeks Lab weekly during chemo to start 12/18  Next chemo order in care plan is 10/01/16 - confirmed w/desk nurse chemo to start 12/18.

## 2016-08-14 DIAGNOSIS — Z86718 Personal history of other venous thrombosis and embolism: Secondary | ICD-10-CM | POA: Diagnosis not present

## 2016-08-14 DIAGNOSIS — Z7984 Long term (current) use of oral hypoglycemic drugs: Secondary | ICD-10-CM | POA: Diagnosis not present

## 2016-08-14 DIAGNOSIS — C9000 Multiple myeloma not having achieved remission: Secondary | ICD-10-CM | POA: Diagnosis not present

## 2016-08-14 DIAGNOSIS — I872 Venous insufficiency (chronic) (peripheral): Secondary | ICD-10-CM | POA: Diagnosis not present

## 2016-08-14 DIAGNOSIS — I10 Essential (primary) hypertension: Secondary | ICD-10-CM | POA: Diagnosis not present

## 2016-08-14 DIAGNOSIS — Z89512 Acquired absence of left leg below knee: Secondary | ICD-10-CM | POA: Diagnosis not present

## 2016-08-14 DIAGNOSIS — Z7901 Long term (current) use of anticoagulants: Secondary | ICD-10-CM | POA: Diagnosis not present

## 2016-08-14 DIAGNOSIS — E0952 Drug or chemical induced diabetes mellitus with diabetic peripheral angiopathy with gangrene: Secondary | ICD-10-CM | POA: Diagnosis not present

## 2016-08-14 DIAGNOSIS — D649 Anemia, unspecified: Secondary | ICD-10-CM | POA: Diagnosis not present

## 2016-08-14 DIAGNOSIS — R296 Repeated falls: Secondary | ICD-10-CM | POA: Diagnosis not present

## 2016-08-14 DIAGNOSIS — E785 Hyperlipidemia, unspecified: Secondary | ICD-10-CM | POA: Diagnosis not present

## 2016-08-14 DIAGNOSIS — Z9181 History of falling: Secondary | ICD-10-CM | POA: Diagnosis not present

## 2016-08-14 DIAGNOSIS — Z79891 Long term (current) use of opiate analgesic: Secondary | ICD-10-CM | POA: Diagnosis not present

## 2016-08-14 DIAGNOSIS — I429 Cardiomyopathy, unspecified: Secondary | ICD-10-CM | POA: Diagnosis not present

## 2016-08-14 DIAGNOSIS — Z87440 Personal history of urinary (tract) infections: Secondary | ICD-10-CM | POA: Diagnosis not present

## 2016-08-15 ENCOUNTER — Other Ambulatory Visit: Payer: Self-pay

## 2016-08-15 DIAGNOSIS — R627 Adult failure to thrive: Secondary | ICD-10-CM | POA: Diagnosis not present

## 2016-08-15 DIAGNOSIS — Z4789 Encounter for other orthopedic aftercare: Secondary | ICD-10-CM | POA: Diagnosis not present

## 2016-08-15 NOTE — Telephone Encounter (Signed)
[name] left v/m request refill mirtazapine. Last refilled # 15 on 08/03/16 by Dr Irine Seal. Pt last seen 08/05/16.Please advise.

## 2016-08-17 MED ORDER — MIRTAZAPINE 7.5 MG PO TABS: 7.5000 mg | ORAL_TABLET | Freq: Every evening | ORAL | 2 refills | Status: DC | PRN

## 2016-08-17 NOTE — Telephone Encounter (Signed)
Sent. Thanks.   

## 2016-08-18 ENCOUNTER — Telehealth: Payer: Self-pay | Admitting: Medical Oncology

## 2016-08-18 ENCOUNTER — Other Ambulatory Visit: Payer: Self-pay | Admitting: Medical Oncology

## 2016-08-18 ENCOUNTER — Ambulatory Visit (HOSPITAL_BASED_OUTPATIENT_CLINIC_OR_DEPARTMENT_OTHER): Payer: Medicare Other

## 2016-08-18 ENCOUNTER — Ambulatory Visit: Payer: Medicare Other

## 2016-08-18 ENCOUNTER — Encounter (HOSPITAL_COMMUNITY): Payer: Self-pay | Admitting: Emergency Medicine

## 2016-08-18 ENCOUNTER — Inpatient Hospital Stay (HOSPITAL_COMMUNITY)
Admission: EM | Admit: 2016-08-18 | Discharge: 2016-08-20 | DRG: 640 | Disposition: A | Discharge status: Home / Self Care | Payer: Medicare Other | Attending: Family Medicine | Admitting: Family Medicine

## 2016-08-18 DIAGNOSIS — N289 Disorder of kidney and ureter, unspecified: Secondary | ICD-10-CM | POA: Diagnosis present

## 2016-08-18 DIAGNOSIS — Z9221 Personal history of antineoplastic chemotherapy: Secondary | ICD-10-CM

## 2016-08-18 DIAGNOSIS — Z7984 Long term (current) use of oral hypoglycemic drugs: Secondary | ICD-10-CM

## 2016-08-18 DIAGNOSIS — D630 Anemia in neoplastic disease: Secondary | ICD-10-CM | POA: Diagnosis present

## 2016-08-18 DIAGNOSIS — R5383 Other fatigue: Secondary | ICD-10-CM | POA: Diagnosis not present

## 2016-08-18 DIAGNOSIS — K219 Gastro-esophageal reflux disease without esophagitis: Secondary | ICD-10-CM | POA: Diagnosis not present

## 2016-08-18 DIAGNOSIS — I429 Cardiomyopathy, unspecified: Secondary | ICD-10-CM | POA: Diagnosis not present

## 2016-08-18 DIAGNOSIS — C9002 Multiple myeloma in relapse: Secondary | ICD-10-CM | POA: Diagnosis not present

## 2016-08-18 DIAGNOSIS — Z89512 Acquired absence of left leg below knee: Secondary | ICD-10-CM

## 2016-08-18 DIAGNOSIS — C9000 Multiple myeloma not having achieved remission: Secondary | ICD-10-CM

## 2016-08-18 DIAGNOSIS — I825Z2 Chronic embolism and thrombosis of unspecified deep veins of left distal lower extremity: Secondary | ICD-10-CM

## 2016-08-18 DIAGNOSIS — Z923 Personal history of irradiation: Secondary | ICD-10-CM | POA: Diagnosis not present

## 2016-08-18 DIAGNOSIS — E1151 Type 2 diabetes mellitus with diabetic peripheral angiopathy without gangrene: Secondary | ICD-10-CM | POA: Diagnosis present

## 2016-08-18 DIAGNOSIS — Z7901 Long term (current) use of anticoagulants: Secondary | ICD-10-CM

## 2016-08-18 DIAGNOSIS — Z79899 Other long term (current) drug therapy: Secondary | ICD-10-CM | POA: Diagnosis not present

## 2016-08-18 DIAGNOSIS — I739 Peripheral vascular disease, unspecified: Secondary | ICD-10-CM | POA: Diagnosis not present

## 2016-08-18 DIAGNOSIS — E785 Hyperlipidemia, unspecified: Secondary | ICD-10-CM | POA: Diagnosis present

## 2016-08-18 DIAGNOSIS — G9341 Metabolic encephalopathy: Secondary | ICD-10-CM | POA: Diagnosis not present

## 2016-08-18 DIAGNOSIS — Z86718 Personal history of other venous thrombosis and embolism: Secondary | ICD-10-CM

## 2016-08-18 DIAGNOSIS — R531 Weakness: Secondary | ICD-10-CM | POA: Diagnosis not present

## 2016-08-18 DIAGNOSIS — F329 Major depressive disorder, single episode, unspecified: Secondary | ICD-10-CM | POA: Diagnosis present

## 2016-08-18 DIAGNOSIS — I10 Essential (primary) hypertension: Secondary | ICD-10-CM | POA: Diagnosis present

## 2016-08-18 DIAGNOSIS — F419 Anxiety disorder, unspecified: Secondary | ICD-10-CM | POA: Diagnosis present

## 2016-08-18 DIAGNOSIS — Z87891 Personal history of nicotine dependence: Secondary | ICD-10-CM | POA: Diagnosis not present

## 2016-08-18 LAB — COMPREHENSIVE METABOLIC PANEL
ALT: 13 U/L (ref 0–55)
ANION GAP: 14 meq/L — AB (ref 3–11)
AST: 118 U/L — ABNORMAL HIGH (ref 5–34)
Albumin: 3.6 g/dL (ref 3.5–5.0)
Alkaline Phosphatase: 98 U/L (ref 40–150)
BILIRUBIN TOTAL: 0.23 mg/dL (ref 0.20–1.20)
BUN: 41.1 mg/dL — ABNORMAL HIGH (ref 7.0–26.0)
CALCIUM: 16 mg/dL — AB (ref 8.4–10.4)
CHLORIDE: 110 meq/L — AB (ref 98–109)
CO2: 22 mEq/L (ref 22–29)
CREATININE: 2.8 mg/dL — AB (ref 0.6–1.1)
EGFR: 18 mL/min/{1.73_m2} — AB (ref >90)
Glucose: 65 mg/dl — ABNORMAL LOW (ref 70–140)
Potassium: 4.8 mEq/L (ref 3.5–5.1)
Sodium: 145 mEq/L (ref 136–145)
Total Protein: 7.8 g/dL (ref 6.4–8.3)

## 2016-08-18 LAB — CBC WITH DIFFERENTIAL/PLATELET
BASO%: 0.7 % (ref 0.0–2.0)
BASOS ABS: 0.1 10*3/uL (ref 0.0–0.1)
EOS ABS: 0.1 10*3/uL (ref 0.0–0.5)
EOS%: 0.7 % (ref 0.0–7.0)
HCT: 23.7 % — ABNORMAL LOW (ref 34.8–46.6)
HGB: 7.4 g/dL — ABNORMAL LOW (ref 11.6–15.9)
LYMPH%: 23.6 % (ref 14.0–49.7)
MCH: 26.4 pg (ref 25.1–34.0)
MCHC: 31.2 g/dL — AB (ref 31.5–36.0)
MCV: 84.6 fL (ref 79.5–101.0)
MONO#: 1 10*3/uL — AB (ref 0.1–0.9)
MONO%: 13.7 % (ref 0.0–14.0)
NEUT%: 61.3 % (ref 38.4–76.8)
NEUTROS ABS: 4.5 10*3/uL (ref 1.5–6.5)
PLATELETS: 165 10*3/uL (ref 145–400)
RBC: 2.8 10*6/uL — ABNORMAL LOW (ref 3.70–5.45)
RDW: 17.1 % — ABNORMAL HIGH (ref 11.2–14.5)
WBC: 7.4 10*3/uL (ref 3.9–10.3)
lymph#: 1.7 10*3/uL (ref 0.9–3.3)
nRBC: 0 % (ref 0–0)

## 2016-08-18 LAB — GLUCOSE, CAPILLARY
GLUCOSE-CAPILLARY: 60 mg/dL — AB (ref 65–99)
GLUCOSE-CAPILLARY: 85 mg/dL (ref 65–99)
Glucose-Capillary: 60 mg/dL — ABNORMAL LOW (ref 65–99)

## 2016-08-18 LAB — PROTIME-INR
INR: 4.1 — AB (ref 2.00–3.50)
Protime: 49.2 Seconds — ABNORMAL HIGH (ref 10.6–13.4)

## 2016-08-18 MED ORDER — SODIUM CHLORIDE 0.9 % IV SOLN
Freq: Once | INTRAVENOUS | Status: AC
  Administered 2016-08-18: 18:00:00 via INTRAVENOUS

## 2016-08-18 MED ORDER — SODIUM CHLORIDE 0.9 % IV SOLN
INTRAVENOUS | Status: DC
  Administered 2016-08-18 – 2016-08-20 (×5): via INTRAVENOUS

## 2016-08-18 MED ORDER — ALPRAZOLAM 0.25 MG PO TABS
0.2500 mg | ORAL_TABLET | Freq: Two times a day (BID) | ORAL | Status: DC | PRN
  Administered 2016-08-18: 0.5 mg via ORAL
  Administered 2016-08-19 – 2016-08-20 (×2): 0.25 mg via ORAL
  Filled 2016-08-18: qty 1
  Filled 2016-08-18 (×2): qty 2
  Filled 2016-08-18: qty 1

## 2016-08-18 MED ORDER — AMLODIPINE BESYLATE 5 MG PO TABS
5.0000 mg | ORAL_TABLET | Freq: Every day | ORAL | Status: DC
  Administered 2016-08-19 – 2016-08-20 (×2): 5 mg via ORAL
  Filled 2016-08-18 (×2): qty 1

## 2016-08-18 MED ORDER — INSULIN ASPART 100 UNIT/ML ~~LOC~~ SOLN: 0.0000 [IU] | Freq: Every day | SUBCUTANEOUS | Status: DC

## 2016-08-18 MED ORDER — MIRTAZAPINE 30 MG PO TABS
15.0000 mg | ORAL_TABLET | Freq: Every evening | ORAL | Status: DC | PRN
  Administered 2016-08-18 – 2016-08-19 (×2): 15 mg via ORAL
  Filled 2016-08-18: qty 0.5
  Filled 2016-08-18: qty 1
  Filled 2016-08-18: qty 0.5
  Filled 2016-08-18: qty 1
  Filled 2016-08-18: qty 0.5
  Filled 2016-08-18: qty 1

## 2016-08-18 MED ORDER — TRAMADOL HCL 50 MG PO TABS
25.0000 mg | ORAL_TABLET | Freq: Two times a day (BID) | ORAL | Status: DC | PRN
  Administered 2016-08-18 – 2016-08-20 (×4): 50 mg via ORAL
  Filled 2016-08-18 (×5): qty 1

## 2016-08-18 MED ORDER — CALCITONIN (SALMON) 200 UNIT/ML IJ SOLN
180.0000 [IU] | Freq: Two times a day (BID) | INTRAMUSCULAR | Status: DC
  Administered 2016-08-18 – 2016-08-20 (×4): 180 [IU] via SUBCUTANEOUS
  Filled 2016-08-18 (×5): qty 0.9

## 2016-08-18 MED ORDER — FUROSEMIDE 10 MG/ML IJ SOLN
40.0000 mg | Freq: Once | INTRAMUSCULAR | Status: AC
  Administered 2016-08-18: 40 mg via INTRAVENOUS
  Filled 2016-08-18: qty 4

## 2016-08-18 MED ORDER — INSULIN ASPART 100 UNIT/ML ~~LOC~~ SOLN: 0.0000 [IU] | Freq: Three times a day (TID) | SUBCUTANEOUS | Status: DC

## 2016-08-18 MED ORDER — WARFARIN - PHARMACIST DOSING INPATIENT: Freq: Every day | Status: DC

## 2016-08-18 MED ORDER — ACETAMINOPHEN 650 MG RE SUPP
650.0000 mg | Freq: Four times a day (QID) | RECTAL | Status: DC | PRN
Start: 2016-08-18 — End: 2016-08-20

## 2016-08-18 MED ORDER — PANTOPRAZOLE SODIUM 40 MG PO TBEC
40.0000 mg | DELAYED_RELEASE_TABLET | Freq: Every day | ORAL | Status: DC
  Administered 2016-08-19 – 2016-08-20 (×2): 40 mg via ORAL
  Filled 2016-08-18 (×2): qty 1

## 2016-08-18 MED ORDER — SODIUM CHLORIDE 0.9 % IV SOLN
90.0000 mg | Freq: Once | INTRAVENOUS | Status: AC
  Administered 2016-08-18: 90 mg via INTRAVENOUS
  Filled 2016-08-18: qty 10

## 2016-08-18 MED ORDER — SODIUM CHLORIDE 0.9% FLUSH
3.0000 mL | Freq: Two times a day (BID) | INTRAVENOUS | Status: DC
  Administered 2016-08-18 – 2016-08-19 (×2): 3 mL via INTRAVENOUS

## 2016-08-18 MED ORDER — TRAMADOL HCL 50 MG PO TABS: 25.0000 mg | ORAL_TABLET | Freq: Four times a day (QID) | ORAL | Status: DC | PRN

## 2016-08-18 MED ORDER — ACETAMINOPHEN 325 MG PO TABS
650.0000 mg | ORAL_TABLET | Freq: Four times a day (QID) | ORAL | Status: DC | PRN
  Administered 2016-08-18 – 2016-08-19 (×2): 650 mg via ORAL
  Filled 2016-08-18 (×2): qty 2

## 2016-08-18 NOTE — ED Notes (Signed)
Bed: RESB Expected date:  Expected time:  Means of arrival:  Comments: [facility]

## 2016-08-18 NOTE — H&P (Signed)
History and Physical    [name] KZL:[id number] DOB: 1935/11/09 DOA: 08/18/2016  PCP: [name], MD  Patient coming from: Home  Chief Complaint: Weakness  HPI: [name] is a 80 y.o. female with medical history significant of multiple myeloma, anxiety, recurrent DVT, diabetes, chronic therapeutic anticoagulation who is referred to the emergency department for newly found calcium of over 16 with INR 4.1. Family reports patient has been increasingly weak over the past several weeks. At present, patient is confused and unable to reliably give full history. Patient's daughter is at the bedside however. Per family member, patient had been without any nausea vomiting hematuria or bright blood per rectum. Patient also had not been complaining of abdominal pains.  ED Course: In emergency department, patient was started on IV fluids of 150 mL per hour and given 1 dose of Lasix 40 mg. Hospitalist service consulted for consideration for admission.  Review of Systems:  Review of Systems  Unable to perform ROS: Mental acuity    Past Medical History:  Diagnosis Date  . Anemia   . Anxiety    takes Xanax daily as needed  . Blood transfusion   . Cardiomyopathy ([place])   . Deep vein thrombosis of bilateral lower extremities (HCC)   . Degenerative joint disease   . Depression    Mild  . Diabetes mellitus    takes Glipizide daily  . Family history of anesthesia complication    DAUGHTER CPR AFTER  . GERD (gastroesophageal reflux disease)    takes Protonix daily  . Heart murmur   . History of chicken pox   . History of radiation therapy 08/30/13-09/20/13   20 gray to lower lumbar/upper sacrum  . History of shingles   . Hyperlipidemia   . Hypertension    takes Amlodipine,HCTZ,and Lisinopril daily  . Multiple myeloma    per Dr. Julien Nordmann, s/p palliative radiation for leg pain 2011 per Dr. Sondra Come  . Phlebitis   . UTI (urinary tract infection) 09/08/2013    Past Surgical History:    Procedure Laterality Date  . ABDOMINAL HYSTERECTOMY    . AMPUTATION Left 10/09/2015   Procedure: Left Leg AMPUTATION BELOW KNEE;  Surgeon: Angelia Mould, MD;  Location: [facility];  Service: Vascular;  Laterality: Left;  . ANKLE FRACTURE SURGERY Left   . APPLICATION OF WOUND VAC Left 11/16/2015   Procedure: APPLICATION OF WOUND VAC LEFT BELOW THE KNEE AMPUTEE.;  Surgeon: Angelia Mould, MD;  Location: [facility];  Service: Vascular;  Laterality: Left;  . COLONOSCOPY    . EAR CYST EXCISION Left 04/16/2015   Procedure: EXCISION FACIAL CYST LEFT SIDE ;  Surgeon: Izora Gala, MD;  Location: [facility];  Service: ENT;  Laterality: Left;  . EYE SURGERY Bilateral    cataract surgery  . I&D EXTREMITY Left 11/16/2015   Procedure: DEBRIDEMENT OF LEFT  BELOW KNEE AMPUTATION ;  Surgeon: Angelia Mould, MD;  Location: [facility];  Service: Vascular;  Laterality: Left;  . PERIPHERAL VASCULAR CATHETERIZATION  08/14/2015   Procedure: Lower Extremity Intervention;  Surgeon: Katha Cabal, MD;  Location: [facility];  Service: Cardiovascular;;  . PERIPHERAL VASCULAR CATHETERIZATION N/A 08/14/2015   Procedure: Abdominal Aortogram w/Lower Extremity;  Surgeon: Katha Cabal, MD;  Location: [facility];  Service: Cardiovascular;  Laterality: N/A;     reports that she quit smoking about 42 years ago. She has a 13.00 pack-year smoking history. She has never used smokeless tobacco.  She reports that she does not drink alcohol or use drugs.  No Known Allergies  Family History  Problem Relation Age of Onset  . Arthritis Other     Parents  . Cancer Other     Colon, 1st degree relative <60  . Diabetes Other     Parents  . Stroke Other     1st degree relative <60  . Diabetes Mother   . Hypertension Mother   . Arthritis Mother   . Stroke Mother   . Diabetes Father   . Hypertension Father   . Arthritis Father   . Stroke Father   . Cancer Sister     stomach  .  Stroke Brother   . Cancer Brother     prostate <50  . Stroke Daughter   . Stroke Son     Prior to Admission medications   Medication Sig Start Date End Date Taking? Authorizing Provider  acetaminophen (TYLENOL) 500 MG tablet Take 500-1,000 mg by mouth every 6 (six) hours as needed for mild pain or headache.    Yes Historical Provider, MD  ALPRAZolam Duanne Moron) 0.5 MG tablet Take 0.25-0.5 mg by mouth 2 (two) times daily as needed for anxiety.   Yes Historical Provider, MD  amLODipine (NORVASC) 5 MG tablet Take 1 tablet (5 mg total) by mouth daily. 06/11/16  Yes Tonia Ghent, MD  diclofenac sodium (VOLTAREN) 1 % GEL Apply 2 g topically 2 (two) times daily as needed (for shoulder pain). 06/11/16  Yes Tonia Ghent, MD  glipiZIDE (GLUCOTROL XL) 2.5 MG 24 hr tablet Take 1 tablet (2.5 mg total) by mouth daily. 06/11/16  Yes Tonia Ghent, MD  hydrochlorothiazide (MICROZIDE) 12.5 MG capsule Take 1 capsule (12.5 mg total) by mouth daily. 06/11/16  Yes Tonia Ghent, MD  lisinopril (PRINIVIL,ZESTRIL) 5 MG tablet Take 1 tablet (5 mg total) by mouth daily. 06/11/16  Yes Tonia Ghent, MD  mirtazapine (REMERON) 15 MG tablet Take 15 mg by mouth at bedtime as needed (for sleep).   Yes Historical Provider, MD  pantoprazole (PROTONIX) 40 MG tablet Take 1 tablet (40 mg total) by mouth daily. 06/11/16  Yes Tonia Ghent, MD  potassium chloride SA (K-DUR,KLOR-CON) 20 MEQ tablet Take 1 tablet (20 mEq total) by mouth daily. 06/11/16  Yes Tonia Ghent, MD  prochlorperazine (COMPAZINE) 10 MG tablet Take 1 tablet (10 mg total) by mouth every 6 (six) hours as needed for nausea or vomiting. 08/11/16  Yes Curt Bears, MD  traMADol (ULTRAM) 50 MG tablet Take 25-50 mg by mouth every 6 (six) hours as needed for moderate pain.    Yes Historical Provider, MD  warfarin (COUMADIN) 5 MG tablet Take 5 mg by mouth every evening.   Yes Historical Provider, MD  dexamethasone (DECADRON) 4 MG tablet Take 20 mg by  mouth every Monday.    Historical Provider, MD    Physical Exam: Vitals:   08/18/16 1400 08/18/16 1409 08/18/16 1512  BP: 180/79 185/67 147/70  Pulse: 72 68 76  Resp: _0 Temp: 97.5 F (36.4 C)    TempSrc: Oral    SpO2: 100% 91% 98%    Constitutional: NAD, calm, comfortable Vitals:   08/18/16 1400 08/18/16 1409 08/18/16 1512  BP: 180/79 185/67 147/70  Pulse: 72 68 76  Resp: _1 Temp: 97.5 F (36.4 C)    TempSrc: Oral    SpO2: 100% 91% 98%   Eyes: PERRL, lids and conjunctivae  normal ENMT: Mucous membranes are moist. Posterior pharynx clear of any exudate or lesions.Normal dentition.  Neck: normal, supple, no masses, no thyromegaly Respiratory: clear to auscultation bilaterally, no wheezing Cardiovascular: Regular rate and rhythm, S1-S2 Abdomen: no masses palpated. No hepatosplenomegaly. Bowel sounds positive. Mild grimacing noted on palpation over abdomen Musculoskeletal: no clubbing / cyanosis. No joint deformity upper and lower extremities. Good ROM, no contractures. Normal muscle tone.  Skin: no rashes, lesions, ulcers. No induration Neurologic: CN 2-12 grossly intact. Sensation intact, DTR normal. Strength 5/5 in all 4.  Psychiatric: Confused, is oriented to place. Mood appears normal, no apparent visual hallucinations.    Labs on Admission: I have personally reviewed following labs and imaging studies  CBC:  Recent Labs Lab 08/18/16 1229  WBC 7.4  NEUTROABS 4.5  HGB 7.4*  HCT 23.7*  MCV 84.6  PLT 314   Basic Metabolic Panel:  Recent Labs Lab 08/18/16 1229  NA 145  K 4.8  CO2 22  GLUCOSE 65*  BUN 41.1*  CREATININE 2.8*  CALCIUM 16.0*   GFR: Estimated Creatinine Clearance: 11.9 mL/min (by C-G formula based on SCr of 2.8 mg/dL (H)). Liver Function Tests:  Recent Labs Lab 08/18/16 1229  AST 118*  ALT 13  ALKPHOS 98  BILITOT 0.23  PROT 7.8  ALBUMIN 3.6   No results for input(s): LIPASE, AMYLASE in the last 168 hours. No  results for input(s): AMMONIA in the last 168 hours. Coagulation Profile:  Recent Labs Lab 08/18/16 1229  INR 4.10*  PROTIME 49.2*   Cardiac Enzymes: No results for input(s): CKTOTAL, CKMB, CKMBINDEX, TROPONINI in the last 168 hours. BNP (last 3 results) No results for input(s): PROBNP in the last 8760 hours. HbA1C: No results for input(s): HGBA1C in the last 72 hours. CBG: No results for input(s): GLUCAP in the last 168 hours. Lipid Profile: No results for input(s): CHOL, HDL, LDLCALC, TRIG, CHOLHDL, LDLDIRECT in the last 72 hours. Thyroid Function Tests: No results for input(s): TSH, T4TOTAL, FREET4, T3FREE, THYROIDAB in the last 72 hours. Anemia Panel: No results for input(s): VITAMINB12, FOLATE, FERRITIN, TIBC, IRON, RETICCTPCT in the last 72 hours. Urine analysis:    Component Value Date/Time   COLORURINE YELLOW 07/30/2016 1450   APPEARANCEUR CLEAR 07/30/2016 1450   LABSPEC 1.008 07/30/2016 1450   LABSPEC 1.020 10/19/2013 1546   PHURINE 6.5 07/30/2016 1450   GLUCOSEU NEGATIVE 07/30/2016 1450   GLUCOSEU 1,000 10/19/2013 1546   HGBUR TRACE (A) 07/30/2016 1450   BILIRUBINUR NEGATIVE 07/30/2016 1450   BILIRUBINUR negative 05/18/2015 1545   BILIRUBINUR Negative 10/19/2013 1546   KETONESUR NEGATIVE 07/30/2016 1450   PROTEINUR NEGATIVE 07/30/2016 1450   UROBILINOGEN negative 05/18/2015 1545   UROBILINOGEN 0.2 03/10/2014 0007   UROBILINOGEN 0.2 10/19/2013 1546   NITRITE NEGATIVE 07/30/2016 1450   LEUKOCYTESUR NEGATIVE 07/30/2016 1450   LEUKOCYTESUR Negative 10/19/2013 1546   Sepsis Labs: !!!!!!!!!!!!!!!!!!!!!!!!!!!!!!!!!!!!!!!!!!!! _0 (procalcitonin:4,lacticidven:4) )No results found for this or any previous visit (from the past 240 hour(s)).   Radiological Exams on Admission: No results found.   Assessment/Plan Principal Problem:   Hypercalcemia Active Problems:   Multiple myeloma (HCC)   Hyperlipidemia   PVD (peripheral vascular disease) (HCC)   PAD  (peripheral artery disease) (HCC)   Status post below knee amputation of left lower extremity (HCC)   Hypercalcemia of malignancy   1. Hypercalcemia 1. Likely secondary to multiple myeloma 2. Labs reviewed. Calcium in excess of 16 3. Patient started on IV fluids in the emergency department 4. We'll order  Pamidronate 5. Repeat calcium level in morning 6. We'll admit patient to medical floor, inpatient telemetry 2. Multiple myeloma 1. Presenting hemoglobin of 7.4 2. -Patient hemodynamically stable at this time 3. Will follow CBC in the morning 4. We'll notify patient's hematologist of patient's hospital admission 3. Hyperlipidemia 1. Appears stable at this time 2. Would continue home regimen as tolerated 4. History of recurrent DVT 1. Patient is on therapeutic anticoagulation 2. Labs reviewed. Current INR is supratherapeutic 3. We'll continue Coumadin dosing per pharmacy 5. Chronic therapeutic anticoagulation 1. Per above, patient on Coumadin for history of recurrent DVT 2. No signs of acute bleeding at this time 6. Diabetes 1. Random glucose presently 65 2. Advised patient family to encourage by mouth intake as tolerated 3. We'll continue patient on sliding scale insulin  DVT prophylaxis: Therapeutic Coumadin  Code Status: Full code at this time, family to discuss patient's living will Family Communication: Patient's youngest daughter in room  Disposition Plan: Uncertain at this time  Consults called: Patient's primary hematologist Admission status: Admission to inpatient, telemetry   Quantina Dershem, Orpah Melter MD [facility] Pager [phone]  If 7PM-7AM, please contact night-coverage [email or web address] Password [email or web address]  08/18/2016, 3:55 PM

## 2016-08-18 NOTE — ED Notes (Signed)
Pt being sent from [facility] d/t hypercalcemia (calcium 16).  Hx of multiple myeloma.  Pt has not had recent treatment.

## 2016-08-18 NOTE — Progress Notes (Signed)
ANTICOAGULATION CONSULT NOTE - Initial Consult  Pharmacy Consult for Warfarin Indication: Recurrent DVTs  No Known Allergies  Patient Measurements:   Heparin Dosing Weight:   Vital Signs: Temp: 97.5 F (36.4 C) (12/18 1400) Temp Source: Oral (12/18 1400) BP: 155/63 (12/18 1530) Pulse Rate: 71 (12/18 1530)  Labs:  Recent Labs  08/18/16 1229 08/18/16 1229  HGB 7.4*  --   HCT 23.7*  --   PLT 165  --   INR 4.10*  --   CREATININE  --  2.8*    Estimated Creatinine Clearance: 11.9 mL/min (by C-G formula based on SCr of 2.8 mg/dL (H)).   Medical History: Past Medical History:  Diagnosis Date  . Anemia   . Anxiety    takes Xanax daily as needed  . Blood transfusion   . Cardiomyopathy (HCC)   . Deep vein thrombosis of bilateral lower extremities (HCC)   . Degenerative joint disease   . Depression    Mild  . Diabetes mellitus    takes Glipizide daily  . Family history of anesthesia complication    DAUGHTER CPR AFTER  . GERD (gastroesophageal reflux disease)    takes Protonix daily  . Heart murmur   . History of chicken pox   . History of radiation therapy 08/30/13-09/20/13   20 gray to lower lumbar/upper sacrum  . History of shingles   . Hyperlipidemia   . Hypertension    takes Amlodipine,HCTZ,and Lisinopril daily  . Multiple myeloma    per Dr. Mohamed, s/p palliative radiation for leg pain 2011 per Dr. Kinard  . Phlebitis   . UTI (urinary tract infection) 09/08/2013    Medications:  PTA warfarin 5mg daily, LD 12/17 @ 1800  Assessment: 80 yoF with hx MM, recurrent DVTs on chronic warfarin, and DM presents with weakness, hypercalcemia, and supratherapeutic INR.  Pharmacy consulted to dose warfarin inpatient.    12/18: INR = 4.10, Hgb 7.4, plts 165.   Goal of Therapy:  INR 2-3 Monitor platelets by anticoagulation protocol: Yes   Plan:  Hold warfarin tonight.   Daily PT/INR   , PharmD, BCPS 08/18/2016, 4:09 PM  Pager: [phone]  

## 2016-08-18 NOTE — ED Provider Notes (Signed)
I saw and evaluated the patient, reviewed the resident's note and I agree with the findings and plan.   EKG Interpretation None     80 year old female with history of multiple myeloma presents for cancer Center with hypercalcemia. Patient's calcium is 16. Will treat with IV fluids and diuretics and admitted to the hospitalist service   Lacretia Leigh, MD 08/18/16 [phone]

## 2016-08-18 NOTE — Telephone Encounter (Signed)
Called report with high calcium to ED  Charge RN and need for evaluation and tx. Ed will call infusion with room number.

## 2016-08-18 NOTE — Progress Notes (Signed)
Based on lab values patient to go to Emergency Department per Dr. Julien Nordmann. Patient family member verbalized understanding. Patient transported to ED via wheelchair. Report given to Tim, RN and Ralph Leyden, RN in the ED.

## 2016-08-18 NOTE — ED Notes (Signed)
Bed: WA13 Expected date:  Expected time:  Means of arrival:  Comments: Res B 

## 2016-08-18 NOTE — ED Triage Notes (Signed)
Pt has hx of multiple myeloma. Went to cancer center for treatment and was found to have hypercalcemia. No recent treatments for multiple myeloma. Otherwise complains of generalized weakness.

## 2016-08-18 NOTE — ED Provider Notes (Signed)
Emergency Department Provider Note   I have reviewed the triage vital signs and the nursing notes.   HISTORY  Chief Complaint hypercalcemia and Fatigue   HPI [name] is a 80 y.o. female with past medical history significant for multiple myeloma, hypertension, diabetes and peripheral muscular disease S/P left BKA brought to ED from [facility] after being found hypercalcemic. As patient was accompanied by her daughter, who told that she is having worsening confusion and generalized weakness since Thanksgiving. She went to [facility] today for her treatment, where they found her calcium of 16 and INR of 4.1. They sent the patient to emergency department at that point without giving her any treatment.  She denies any nausea, vomiting, hematuria or hematochezia. She denies any pain.   Past Medical History:  Diagnosis Date  . Anemia   . Anxiety    takes Xanax daily as needed  . Blood transfusion   . Cardiomyopathy ([place])   . Deep vein thrombosis of bilateral lower extremities (HCC)   . Degenerative joint disease   . Depression    Mild  . Diabetes mellitus    takes Glipizide daily  . Family history of anesthesia complication    DAUGHTER CPR AFTER  . GERD (gastroesophageal reflux disease)    takes Protonix daily  . Heart murmur   . History of chicken pox   . History of radiation therapy 08/30/13-09/20/13   20 gray to lower lumbar/upper sacrum  . History of shingles   . Hyperlipidemia   . Hypertension    takes Amlodipine,HCTZ,and Lisinopril daily  . Multiple myeloma    per Dr. Julien Nordmann, s/p palliative radiation for leg pain 2011 per Dr. Sondra Come  . Phlebitis   . UTI (urinary tract infection) 09/08/2013    Patient Active Problem List   Diagnosis Date Noted  . Anemia 07/29/2016  . Hypercalcemia 07/29/2016  . Knee pain 07/29/2016  . Rib pain 07/22/2016  . Foot pain 07/21/2016  . Pain in joint, shoulder region 06/12/2016  . Wound dehiscence 11/16/2015  .  Anxiety state   . Chronic anticoagulation   . History of DVT (deep vein thrombosis)   . Acute blood loss anemia   . Post-operative pain   . Status post below knee amputation of left lower extremity ([place]) 10/12/2015  . PAD (peripheral artery disease) ([place]) 10/09/2015  . Dry gangrene ([place]) 09/13/2015  . PVD (peripheral vascular disease) ([place]) 09/09/2015  . Encounter for antineoplastic chemotherapy 06/26/2015  . Chronic pain 02/07/2015  . Hypokalemia 11/28/2014  . Long term current use of anticoagulant therapy 11/28/2014  . Fissure in skin of foot 11/28/2014  . Sinus bradycardia 09/08/2014  . DNR no code (do not resuscitate) 09/08/2013  . Chronic steroid use 09/08/2013  . Deep vein thrombosis of left lower extremity ([place]) 05/12/2013  . Deep vein thrombosis of right lower extremity ([place]) 05/12/2013  . Left hip pain 05/16/2012  . Gait instability 05/08/2011  . Multiple myeloma ([place]) 03/29/2010  . Diabetes mellitus without complication ([place]) 82/50/0370  . DEPRESSION, MILD 03/29/2010  . LEG PAIN, RIGHT 03/29/2010  . Hyperlipidemia 05/19/2008  . Essential hypertension 05/19/2008  . Osteoarthritis 05/19/2008    Past Surgical History:  Procedure Laterality Date  . ABDOMINAL HYSTERECTOMY    . AMPUTATION Left 10/09/2015   Procedure: Left Leg AMPUTATION BELOW KNEE;  Surgeon: Angelia Mould, MD;  Location: [facility];  Service: Vascular;  Laterality: Left;  . ANKLE FRACTURE SURGERY Left   . APPLICATION OF WOUND  VAC Left 11/16/2015   Procedure: APPLICATION OF WOUND VAC LEFT BELOW THE KNEE AMPUTEE.;  Surgeon: Angelia Mould, MD;  Location: [facility];  Service: Vascular;  Laterality: Left;  . COLONOSCOPY    . EAR CYST EXCISION Left 04/16/2015   Procedure: EXCISION FACIAL CYST LEFT SIDE ;  Surgeon: Izora Gala, MD;  Location: [facility];  Service: ENT;  Laterality: Left;  . EYE SURGERY Bilateral    cataract surgery  . I&D EXTREMITY Left 11/16/2015   Procedure: DEBRIDEMENT OF  LEFT  BELOW KNEE AMPUTATION ;  Surgeon: Angelia Mould, MD;  Location: [facility];  Service: Vascular;  Laterality: Left;  . PERIPHERAL VASCULAR CATHETERIZATION  08/14/2015   Procedure: Lower Extremity Intervention;  Surgeon: Katha Cabal, MD;  Location: [facility];  Service: Cardiovascular;;  . PERIPHERAL VASCULAR CATHETERIZATION N/A 08/14/2015   Procedure: Abdominal Aortogram w/Lower Extremity;  Surgeon: Katha Cabal, MD;  Location: [facility];  Service: Cardiovascular;  Laterality: N/A;    Current Outpatient Rx  . Order #: [id number] Class: Historical Med  . Order #: [id number] Class: Normal  . Order #: [id number] Class: Normal  . Order #: [id number] Class: Normal  . Order #: [id number] Class: Normal  . Order #: [id number] Class: Normal  . Order #: [id number] Class: Normal  . Order #: [id number] Class: Normal  . Order #: [id number] Class: Normal  . Order #: [id number] Class: Normal  . Order #: [id number] Class: Normal  . Order #: [id number] Class: Normal  . Order #: [id number] Class: Normal  . Order #: [id number] Class: No Print  . Order #: [id number] Class: Print    Allergies Patient has no known allergies.  Family History  Problem Relation Age of Onset  . Arthritis Other     Parents  . Cancer Other     Colon, 1st degree relative <60  . Diabetes Other     Parents  . Stroke Other     1st degree relative <60  . Diabetes Mother   . Hypertension Mother   . Arthritis Mother   . Stroke Mother   . Diabetes Father   . Hypertension Father   . Arthritis Father   . Stroke Father   . Cancer Sister     stomach  . Stroke Brother   . Cancer Brother     prostate <50  . Stroke Daughter   . Stroke Son     Social History Social History  Substance Use Topics  . Smoking status: Former Smoker    Packs/day: 1.00    Years: 13.00    Quit date: 09/01/1973  . Smokeless tobacco: Never Used     Comment: QIUT MANY YEARS AGO  . Alcohol use No    Review of  Systems Constitutional: No fever/chills,Confusion and generalized weakness. Eyes: No visual changes. ENT: No sore throat. Cardiovascular: Denies chest pain. Respiratory: Denies shortness of breath. Gastrointestinal: No abdominal pain.  No nausea, no vomiting.  No diarrhea.  No constipation. Genitourinary: Negative for dysuria. Musculoskeletal: Negative for back pain. Skin: Negative for rash. Neurological: Negative for headaches, focal weakness or numbness. 10-point ROS otherwise negative.  ____________________________________________   PHYSICAL EXAM:  VITAL SIGNS: ED Triage Vitals  Enc Vitals Group     BP 08/18/16 1400 180/79     Pulse Rate 08/18/16 1400 72     Resp 08/18/16 1400 16     Temp 08/18/16 1400 97.5 F (36.4 C)     Temp Source 08/18/16 1400 Oral     SpO2 08/18/16 1400 100 %  Weight --      Height --      Head Circumference --      Peak Flow --      Pain Score 08/18/16 1402 0     Pain Loc --      Pain Edu? --      Excl. in [place]? --    Constitutional: Emaciated and chronically ill looking lady, in no acute distress. Eyes: Conjunctivae are normal. PERRL. EOMI. Head: Atraumatic. Nose: No congestion/rhinnorhea. Mouth/Throat: Mucous membranes are dry. Oropharynx non-erythematous. Neck: No stridor.  No meningeal signs.  Cardiovascular: Normal rate, regular rhythm. Good peripheral circulation. Grossly normal heart sounds.   Respiratory: Normal respiratory effort.  No retractions. Lungs CTAB. Gastrointestinal: Soft and nontender. No distention.  Musculoskeletal: Left BKA, No lower extremity tenderness nor edema. Neurologic: Alert and oriented to only her name,  No gross focal neurologic deficits are appreciated.  Skin:  Skin is warm, dry and intact. No rash noted.   ____________________________________________   LABS (all labs ordered are listed, but only abnormal results are displayed)  Labs Reviewed - No data to  display ____________________________________________  EKG  None ____________________________________________  RADIOLOGY  No results found.  ____________________________________________   PROCEDURES  Procedure(s) performed:   Procedures   ____________________________________________   INITIAL IMPRESSION / ASSESSMENT AND PLAN / ED COURSE  Pertinent labs & imaging results that were available during my care of the patient were reviewed by me and considered in my medical decision making (see chart for details).  [name] is a 80 y.o. female with past medical history significant for multiple myeloma, hypertension, diabetes and peripheral muscular disease S/P left BKA brought to ED from [facility] after being found hypercalcemic.  According to her daughter she is having worsening confusion and generalized weakness since Thanksgiving. They have not noticed any acute change recently.   She was started on normal saline and Lasix in ED.  She has had hypercalcemia secondary to multiple myeloma. She is also having worsening anemia and worsening renal functions. She is on Coumadin with hypertherapeutic INR.  Hospitalist service was consulted for admission.     ____________________________________________  FINAL CLINICAL IMPRESSION(S) / ED DIAGNOSES  Final diagnoses:  None     MEDICATIONS GIVEN DURING THIS VISIT:  Medications  0.9 %  sodium chloride infusion (not administered)  furosemide (LASIX) injection 40 mg (not administered)     NEW OUTPATIENT MEDICATIONS STARTED DURING THIS VISIT:  New Prescriptions   No medications on file      Note:  This document was prepared using Dragon voice recognition software and may include unintentional dictation errors.  Emergency Medicine   Lorella Nimrod, MD 08/18/16 [phone]

## 2016-08-19 ENCOUNTER — Ambulatory Visit: Payer: Medicare Other

## 2016-08-19 ENCOUNTER — Telehealth: Payer: Self-pay | Admitting: Family Medicine

## 2016-08-19 DIAGNOSIS — N289 Disorder of kidney and ureter, unspecified: Secondary | ICD-10-CM

## 2016-08-19 DIAGNOSIS — C9002 Multiple myeloma in relapse: Secondary | ICD-10-CM

## 2016-08-19 DIAGNOSIS — Z89512 Acquired absence of left leg below knee: Secondary | ICD-10-CM

## 2016-08-19 DIAGNOSIS — D630 Anemia in neoplastic disease: Secondary | ICD-10-CM

## 2016-08-19 DIAGNOSIS — R531 Weakness: Secondary | ICD-10-CM

## 2016-08-19 DIAGNOSIS — R5383 Other fatigue: Secondary | ICD-10-CM

## 2016-08-19 LAB — COMPREHENSIVE METABOLIC PANEL
ALBUMIN: 3.6 g/dL (ref 3.5–5.0)
ALT: 14 U/L (ref 14–54)
AST: 92 U/L — ABNORMAL HIGH (ref 15–41)
Alkaline Phosphatase: 74 U/L (ref 38–126)
Anion gap: 10 (ref 5–15)
BUN: 36 mg/dL — AB (ref 6–20)
CHLORIDE: 108 mmol/L (ref 101–111)
CO2: 23 mmol/L (ref 22–32)
CREATININE: 2.39 mg/dL — AB (ref 0.44–1.00)
Calcium: 12.8 mg/dL — ABNORMAL HIGH (ref 8.9–10.3)
GFR calc Af Amer: 21 mL/min — ABNORMAL LOW (ref >60)
GFR, EST NON AFRICAN AMERICAN: 18 mL/min — AB (ref >60)
GLUCOSE: 88 mg/dL (ref 65–99)
POTASSIUM: 4.1 mmol/L (ref 3.5–5.1)
Sodium: 141 mmol/L (ref 135–145)
Total Bilirubin: 0.4 mg/dL (ref 0.3–1.2)
Total Protein: 6.8 g/dL (ref 6.5–8.1)

## 2016-08-19 LAB — CBC
HEMATOCRIT: 22 % — AB (ref 36.0–46.0)
Hemoglobin: 7.2 g/dL — ABNORMAL LOW (ref 12.0–15.0)
MCH: 27.5 pg (ref 26.0–34.0)
MCHC: 32.7 g/dL (ref 30.0–36.0)
MCV: 84 fL (ref 78.0–100.0)
PLATELETS: 180 10*3/uL (ref 150–400)
RBC: 2.62 MIL/uL — ABNORMAL LOW (ref 3.87–5.11)
RDW: 16.8 % — AB (ref 11.5–15.5)
WBC: 6 10*3/uL (ref 4.0–10.5)

## 2016-08-19 LAB — GLUCOSE, CAPILLARY
GLUCOSE-CAPILLARY: 120 mg/dL — AB (ref 65–99)
Glucose-Capillary: 102 mg/dL — ABNORMAL HIGH (ref 65–99)
Glucose-Capillary: 74 mg/dL (ref 65–99)
Glucose-Capillary: 94 mg/dL (ref 65–99)

## 2016-08-19 LAB — PROTIME-INR
INR: 3.64
Prothrombin Time: 37.1 seconds — ABNORMAL HIGH (ref 11.4–15.2)

## 2016-08-19 MED ORDER — ADULT MULTIVITAMIN W/MINERALS CH
1.0000 | ORAL_TABLET | Freq: Every day | ORAL | Status: DC
  Administered 2016-08-19 – 2016-08-20 (×2): 1 via ORAL
  Filled 2016-08-19 (×2): qty 1

## 2016-08-19 NOTE — Progress Notes (Signed)
Initial Nutrition Assessment  DOCUMENTATION CODES:   Not applicable  INTERVENTION:  - Will order daily multivitamin with minerals.  - Continue to encourage PO intakes of meals.  - RD will discuss oral nutrition supplement options with pt at follow-up.  NUTRITION DIAGNOSIS:   Inadequate oral intake related to poor appetite, acute illness as evidenced by per patient/family report.  GOAL:   Patient will meet greater than or equal to 90% of their needs  MONITOR:   PO intake, Weight trends, Labs, I & O's  REASON FOR ASSESSMENT:   Malnutrition Screening Tool  ASSESSMENT:   80 y.o. female with medical history significant of multiple myeloma, anxiety, recurrent DVT, diabetes, chronic therapeutic anticoagulation who is referred to the emergency department for newly found calcium of over 16 with INR 4.1. Family reports patient has been increasingly weak over the past several weeks. At present, patient is confused and unable to reliably give full history. Patient's daughter is at the bedside however. Per family member, patient had been without any nausea vomiting hematuria or bright blood per rectum. Patient also had not been complaining of abdominal pains.  Pt seen for MST. BMI adjusted for BKA: 19.3 kg/m2 which indicates normal weight. No intakes documented since admission. Pt was sleeping soundly at time of RD visit and all information was provided by her daughter, who is at bedside. She was not present for breakfast but states that pt ate ~50% of lunch (whipped potatoes, [place], green beans, sliced peaches, and 2 glasses of orange juice) which provided ~230 kcal and 12 grams of protein.   Pt has had a decreased appetite for several weeks and would mainly eat a few bites at meal times and may have a small snack during the day. Pt's daughter states that pt had L BKA done in January or February 2017 and that she has been off of chemo for multiple myeloma since that time. Daughter reports that  pt was to re-start chemo yesterday but was unable d/t severity of hypercalcemia. She states pt does not like Ensure or Boost; will discuss other supplement options with pt at follow-up.   Unable to perform physical assessment at this time but will attempt at follow-up. Daughter states that pt has lost weight but she is unsure of the amount of weight lost.  Per chart review, pt has lost 5 lbs (4.8% body weight) in the past 2 months which is not significant for time frame.   Medications reviewed; 40 mg IV Lasix x1 dose yesterday, sliding scale Novolog, 40 mg oral Protonix/day. Labs reviewed; CBGs: 74 and 102 mg/dL today, BUN: 36 mg/dL, creatinine: 2.39 mg/dL, Ca: 12.8 mg/dL (was 16 yesterday), GFR: 21 mL/min.  IVF: NS @ 125 mL/hr.    Diet Order:  Diet Heart Room service appropriate? Yes; Fluid consistency: Thin  Skin:  Reviewed, no issues (Non-pressure wound to gluteal fold)  Last BM:  12/19  Height:   Ht Readings from Last 1 Encounters:  08/18/16 '5\' 2"'$  (1.575 m)    Weight:   Wt Readings from Last 1 Encounters:  08/18/16 100 lb (45.4 kg)    Ideal Body Weight:  50 kg  BMI:  Body mass index is 18.29 kg/m.  Estimated Nutritional Needs:   Kcal:  1365-1590 (30-35 kcal/kg)  Protein:  54-64 grams (1.2-1.4 grams/kg)  Fluid:  >/= 1.5 L/day  EDUCATION NEEDS:   No education needs identified at this time    Jarome Matin, MS, RD, LDN, [facility] Inpatient Clinical Dietitian Pager # [phone]  After hours/weekend pager # [phone]  

## 2016-08-19 NOTE — Telephone Encounter (Signed)
I saw that she was in.  They will be sending me info. I hope she feels better soon.  Thanks.

## 2016-08-19 NOTE — Progress Notes (Signed)
ANTICOAGULATION CONSULT NOTE -  Pharmacy Consult for Warfarin Indication: Recurrent DVTs  No Known Allergies  Patient Measurements: Height: _0  (157.5 cm) Weight: 100 lb (45.4 kg) IBW/kg (Calculated) : 50.1 Heparin Dosing Weight:   Vital Signs: Temp: 97.7 F (36.5 C) (12/19 0550) Temp Source: Oral (12/19 0550) BP: 107/83 (12/19 0550) Pulse Rate: 72 (12/19 0550)  Labs:  Recent Labs  08/18/16 1229 08/18/16 1229 08/19/16 0458  HGB 7.4*  --  7.2*  HCT 23.7*  --  22.0*  PLT 165  --  180  LABPROT  --   --  37.1*  INR 4.10*  --  3.64  CREATININE  --  2.8* 2.39*    Estimated Creatinine Clearance: 13.9 mL/min (by C-G formula based on SCr of 2.39 mg/dL (H)).   Medical History: Past Medical History:  Diagnosis Date  . Anemia   . Anxiety    takes Xanax daily as needed  . Blood transfusion   . Cardiomyopathy ([place])   . Deep vein thrombosis of bilateral lower extremities (HCC)   . Degenerative joint disease   . Depression    Mild  . Diabetes mellitus    takes Glipizide daily  . Family history of anesthesia complication    DAUGHTER CPR AFTER  . GERD (gastroesophageal reflux disease)    takes Protonix daily  . Heart murmur   . History of chicken pox   . History of radiation therapy 08/30/13-09/20/13   20 gray to lower lumbar/upper sacrum  . History of shingles   . Hyperlipidemia   . Hypertension    takes Amlodipine,HCTZ,and Lisinopril daily  . Multiple myeloma    per Dr. Julien Nordmann, s/p palliative radiation for leg pain 2011 per Dr. Sondra Come  . Phlebitis   . UTI (urinary tract infection) 09/08/2013    Medications:  PTA warfarin 39m daily, LD 12/17 @ 1800  Assessment: 89yoF with hx MM, recurrent DVTs on chronic warfarin, and DM presents with weakness, hypercalcemia, and supratherapeutic INR.  Pharmacy consulted to dose warfarin inpatient.    08/19/2016  INR = 3.64, Hgb 7.2, plts 180.   Goal of Therapy:  INR 2-3 Monitor platelets by anticoagulation protocol:  Yes   Plan:  Hold warfarin tonight.   Daily PT/INR  EDolly RiasRPh 08/19/2016, 9:47 AM Pager [phone]

## 2016-08-19 NOTE — Progress Notes (Signed)
PROGRESS NOTE    [name]  OVF:[id number] DOB: 03-15-1936 DOA: 08/18/2016 PCP: [name], MD    Brief Narrative: 80 y/o with history of MM who presented with hypercalcemia and confusion.   Assessment & Plan:   Principal Problem:   Hypercalcemia of malignancy - Oncologist on board and discuss game plan with him - Plan is to continue IV fluids for today  Active Problems:   Multiple myeloma (HCC)   Hyperlipidemia   PVD (peripheral vascular disease) ([place])   PAD (peripheral artery disease) ([place])   Status post below knee amputation of left lower extremity ([place])  Diabetes mellitus -discontinue SSI and night coverage and place on diabetic diet   DVT prophylaxis: pt on warfarin Code Status: Full Family Communication: Discussed with family member at bedside Disposition Plan: With continued improvement in condition may discharge next am.   Consultants:   None   Procedures: None   Antimicrobials: None   Subjective: Patient has no new complaints. No acute issues overnight   Objective: Vitals:   08/18/16 1649 08/18/16 2200 08/19/16 0550 08/19/16 1430  BP:  (!) 169/61 107/83 (!) 157/73  Pulse:  75 72 78  Resp:  _0 Temp:  98.7 F (37.1 C) 97.7 F (36.5 C) 99.1 F (37.3 C)  TempSrc:  Oral Oral Oral  SpO2:  100% 94% 92%  Weight: 45.4 kg (100 lb)     Height: _1  (1.575 m)       Intake/Output Summary (Last 24 hours) at 08/19/16 1623 Last data filed at 08/19/16 1400  Gross per 24 hour  Intake          2484.16 ml  Output             2050 ml  Net           434.16 ml   Filed Weights   08/18/16 1649  Weight: 45.4 kg (100 lb)    Examination:  General exam: Appears calm and comfortable, in nad  Respiratory system: Clear to auscultation. Respiratory effort normal. Cardiovascular system: S1 & S2 heard, RRR. No JVD, murmurs, rubs, gallops or clicks. No pedal edema. Gastrointestinal system: Abdomen is nondistended, soft and nontender. No  organomegaly or masses felt. Normal bowel sounds heard. Central nervous system: alert and awake, no facial asymmetry Extremities: equal tone at upper extremities Skin: No rashes, lesions or ulcers, on limited exam. Psychiatry:Mood & affect appropriate.     Data Reviewed: I have personally reviewed following labs and imaging studies  CBC:  Recent Labs Lab 08/18/16 1229 08/19/16 0458  WBC 7.4 6.0  NEUTROABS 4.5  --   HGB 7.4* 7.2*  HCT 23.7* 22.0*  MCV 84.6 84.0  PLT 165 841   Basic Metabolic Panel:  Recent Labs Lab 08/18/16 1229 08/19/16 0458  NA 145 141  K 4.8 4.1  CL  --  108  CO2 22 23  GLUCOSE 65* 88  BUN 41.1* 36*  CREATININE 2.8* 2.39*  CALCIUM 16.0* 12.8*   GFR: Estimated Creatinine Clearance: 13.9 mL/min (by C-G formula based on SCr of 2.39 mg/dL (H)). Liver Function Tests:  Recent Labs Lab 08/18/16 1229 08/19/16 0458  AST 118* 92*  ALT 13 14  ALKPHOS 98 74  BILITOT 0.23 0.4  PROT 7.8 6.8  ALBUMIN 3.6 3.6   No results for input(s): LIPASE, AMYLASE in the last 168 hours. No results for input(s): AMMONIA in the last 168 hours. Coagulation Profile:  Recent Labs Lab 08/18/16 1229 08/19/16 0458  INR 4.10* 3.64  PROTIME 49.2*  --    Cardiac Enzymes: No results for input(s): CKTOTAL, CKMB, CKMBINDEX, TROPONINI in the last 168 hours. BNP (last 3 results) No results for input(s): PROBNP in the last 8760 hours. HbA1C: No results for input(s): HGBA1C in the last 72 hours. CBG:  Recent Labs Lab 08/18/16 1738 08/18/16 2157 08/18/16 2323 08/19/16 0810 08/19/16 1153  GLUCAP 60* 60* 85 102* 74   Lipid Profile: No results for input(s): CHOL, HDL, LDLCALC, TRIG, CHOLHDL, LDLDIRECT in the last 72 hours. Thyroid Function Tests: No results for input(s): TSH, T4TOTAL, FREET4, T3FREE, THYROIDAB in the last 72 hours. Anemia Panel: No results for input(s): VITAMINB12, FOLATE, FERRITIN, TIBC, IRON, RETICCTPCT in the last 72 hours. Sepsis Labs: No  results for input(s): PROCALCITON, LATICACIDVEN in the last 168 hours.  No results found for this or any previous visit (from the past 240 hour(s)).       Radiology Studies: No results found.      Scheduled Meds: . amLODipine  5 mg Oral Daily  . calcitonin  180 Units Subcutaneous BID  . insulin aspart  0-15 Units Subcutaneous TID WC  . insulin aspart  0-5 Units Subcutaneous QHS  . multivitamin with minerals  1 tablet Oral Daily  . pantoprazole  40 mg Oral Daily  . sodium chloride flush  3 mL Intravenous Q12H  . Warfarin - Pharmacist Dosing Inpatient   Does not apply q1800   Continuous Infusions: . sodium chloride 125 mL/hr at 08/19/16 0653     LOS: 1 day    Time spent: > 35 minutes    Velvet Bathe, MD [facility] Pager [phone]  If 7PM-7AM, please contact night-coverage [email or web address] Password [facility] 08/19/2016, 4:23 PM

## 2016-08-19 NOTE — Telephone Encounter (Signed)
Patient's daughter,Andrea,called to let Dr.Duncan patient was admitted to [facility]. Her calcium has gone done, but they won't be able to do chemo until her numbers go down.  Patient is doing much better.

## 2016-08-19 NOTE — Progress Notes (Signed)
Inpatient Diabetes Program Recommendations  AACE/ADA: New Consensus Statement on Inpatient Glycemic Control (2015)  Target Ranges:  Prepandial:   less than 140 mg/dL      Peak postprandial:   less than 180 mg/dL (1-2 hours)      Critically ill patients:  140 - 180 mg/dL   Lab Results  Component Value Date   GLUCAP 102 (H) 08/19/2016   HGBA1C 6.2 06/11/2016    Review of Glycemic Control  Diabetes history: DM2 Outpatient Diabetes medications: glipizide 2.5 mg QD Current orders for Inpatient glycemic control: Novolog moderate tidwc and hs  Blood sugars running low. Hypoglycemia all day on 12/18. Has received no insulin since hospitalization.   Inpatient Diabetes Program Recommendations:    Decrease Novolog to sensitive tidwc D/C HS correction. Would recommend d/cing glipizide at home with low blood sugars.  Will continue to follow. Thank you. Lorenda Peck, RD, LDN, CDE Inpatient Diabetes Coordinator [phone]

## 2016-08-19 NOTE — Progress Notes (Signed)
Subjective: The patient is seen and examined today. Her daughter, [name] was at the bedside. The patient has a history of multiple myeloma that was initially diagnosed in September 2009. She status post several chemotherapy regimens and radiation. She has been off treatment for almost a year secondary to below knee amputation of her left lower extremity and rehabilitation for several months. She was supposed to resume her treatment yesterday at the [facility] but repeat comprehensive metabolic panel showed significantly elevated calcium level of 16. She was admitted for treatment of hypercalcemia of malignancy. She was started on IV hydration, calcitonin and pamidronate. She is feeling a little bit better today and her calcium level is down to 12. She continues to complain of fatigue and weakness.  Objective: Vital signs in last 24 hours: Temp:  [97.5 F (36.4 C)-98.7 F (37.1 C)] 97.7 F (36.5 C) (12/19 0550) Pulse Rate:  [65-76] 72 (12/19 0550) Resp:  [16-22] 18 (12/19 0550) BP: (107-185)/(61-83) 107/83 (12/19 0550) SpO2:  [91 %-100 %] 94 % (12/19 0550) Weight:  [100 lb (45.4 kg)] 100 lb (45.4 kg) (12/18 1649)  Intake/Output from previous day: 12/18 0701 - 12/19 0700 In: 1594.6 [P.O.:180; I.V.:1414.6] Out: 1300 [Urine:1300] Intake/Output this shift: No intake/output data recorded.  General appearance: alert, cooperative, fatigued and no distress Resp: clear to auscultation bilaterally Cardio: regular rate and rhythm, S1, S2 normal, no murmur, click, rub or gallop GI: soft, non-tender; bowel sounds normal; no masses,  no organomegaly Extremities: Left below-knee amputation  Lab Results:   Recent Labs  08/18/16 1229 08/19/16 0458  WBC 7.4 6.0  HGB 7.4* 7.2*  HCT 23.7* 22.0*  PLT 165 180   BMET  Recent Labs  08/18/16 1229 08/19/16 0458  NA 145 141  K 4.8 4.1  CL  --  108  CO2 22 23  GLUCOSE 65* 88  BUN 41.1* 36*  CREATININE 2.8* 2.39*  CALCIUM 16.0* 12.8*     Studies/Results: No results found.  Medications: I have reviewed the patient's current medications.   Assessment/Plan: 1) relapsed multiple myeloma: The patient was expected to start systemic chemotherapy was Carfilzomib, Cytoxan and Decadron yesterday but this is currently on hold because of her hypercalcemia of malignancy. We will resume her treatment next week. 2) hypercalcemia of malignancy: Secondary to disease progression of the multiple myeloma. We will continue with IV hydration, subcutaneous calcitonin and she received a dose of pamidronate yesterday. Her calcium level is better today. Continue to monitor calcium level on daily basis until it is normal. 3) anemia of neoplastic disease.: The patient may benefit from 2 units of PRBCs transfusion during her hospitalization. 4) renal insufficiency: Secondary to progression of multiple myeloma. Continue IV hydration and we will continue to monitor it closely. Thank you for taking good care of Ms. Lye. I will continue to follow up the patient with you and assist in her management an as-needed basis.   LOS: 1 day    Pallavi Clifton K. 08/19/2016

## 2016-08-20 ENCOUNTER — Encounter (HOSPITAL_COMMUNITY): Payer: Medicare Other

## 2016-08-20 ENCOUNTER — Telehealth: Payer: Self-pay | Admitting: Medical Oncology

## 2016-08-20 ENCOUNTER — Ambulatory Visit: Payer: Medicare Other | Admitting: Vascular Surgery

## 2016-08-20 LAB — GLUCOSE, CAPILLARY
GLUCOSE-CAPILLARY: 102 mg/dL — AB (ref 65–99)
GLUCOSE-CAPILLARY: 87 mg/dL (ref 65–99)
Glucose-Capillary: 94 mg/dL (ref 65–99)

## 2016-08-20 LAB — BASIC METABOLIC PANEL
ANION GAP: 7 (ref 5–15)
BUN: 28 mg/dL — ABNORMAL HIGH (ref 6–20)
CHLORIDE: 111 mmol/L (ref 101–111)
CO2: 23 mmol/L (ref 22–32)
Calcium: 11.1 mg/dL — ABNORMAL HIGH (ref 8.9–10.3)
Creatinine, Ser: 1.96 mg/dL — ABNORMAL HIGH (ref 0.44–1.00)
GFR calc non Af Amer: 23 mL/min — ABNORMAL LOW (ref >60)
GFR, EST AFRICAN AMERICAN: 27 mL/min — AB (ref >60)
Glucose, Bld: 105 mg/dL — ABNORMAL HIGH (ref 65–99)
POTASSIUM: 3.5 mmol/L (ref 3.5–5.1)
SODIUM: 141 mmol/L (ref 135–145)

## 2016-08-20 LAB — PROTIME-INR
INR: 3.69
Prothrombin Time: 37.5 seconds — ABNORMAL HIGH (ref 11.4–15.2)

## 2016-08-20 MED ORDER — WARFARIN SODIUM 5 MG PO TABS: 5.0000 mg | ORAL_TABLET | ORAL | Status: DC

## 2016-08-20 NOTE — Discharge Summary (Signed)
Physician Discharge Summary  [name] HAL:[id number] DOB: 1935-09-04 DOA: 08/18/2016  PCP: [name], MD  Admit date: 08/18/2016 Discharge date: 08/20/2016  Time spent: > 35 minutes  Recommendations for Outpatient Follow-up:  1. Monitor serum creatinine levels. 2. Monitor calcium levels 3. Ensure patient follows up with oncologist   Discharge Diagnoses:  Principal Problem:   Hypercalcemia Active Problems:   Multiple myeloma (HCC)   Hyperlipidemia   PVD (peripheral vascular disease) (HCC)   PAD (peripheral artery disease) ([place])   Status post below knee amputation of left lower extremity (HCC)   Hypercalcemia of malignancy   Discharge Condition: stable  Diet recommendation: carb modified diet  Filed Weights   08/18/16 1649  Weight: 45.4 kg (100 lb)    History of present illness:  80 y/o with history of MM who presented with hypercalcemia and confusion.  Hospital Course:  Hypercalcemia - Oncology consulted and assisted with case - Pt was given Subcutaneous calcitonin and received pamidronate - Calcium level down to 11.1 - recommend patient f/u with oncologist.  Multiple myeloma - Oncology to continue managing once hypercalcemia improves, as mentioned patient has follow-up appointment  Confusion/metabolic encephalopathy -Resolved most likely secondary to hypercalcemia  For medical conditions listed above continue medication regimen listed below  Procedures:  None  Consultations:  Oncology: Dr. Julien Nordmann  Discharge Exam: Vitals:   08/19/16 2027 08/20/16 0453  BP: (!) 146/62 (!) 161/67  Pulse: 78 80  Resp: 16 18  Temp: 99.9 F (37.7 C) 99.2 F (37.3 C)    General: Pt in nad, alert and awake Cardiovascular: rrr, no rubs Respiratory: no increased wob, no wheezes  Discharge Instructions   Discharge Instructions    Diet - low sodium heart healthy    Complete by:  As directed    Discharge instructions    Complete by:  As directed    Please follow up with your oncologist in 1 week.   Increase activity slowly    Complete by:  As directed      Current Discharge Medication List    CONTINUE these medications which have CHANGED   Details  warfarin (COUMADIN) 5 MG tablet Take 1 tablet (5 mg total) by mouth as directed. Take 5 mg po Monday, Wednesday, Friday.  Take 2.5 mg po all other days.      CONTINUE these medications which have NOT CHANGED   Details  acetaminophen (TYLENOL) 500 MG tablet Take 500-1,000 mg by mouth every 6 (six) hours as needed for mild pain or headache.     ALPRAZolam (XANAX) 0.5 MG tablet Take 0.25-0.5 mg by mouth 2 (two) times daily as needed for anxiety.    amLODipine (NORVASC) 5 MG tablet Take 1 tablet (5 mg total) by mouth daily. Qty: 30 tablet, Refills: 5   Associated Diagnoses: Encounter for immunization    diclofenac sodium (VOLTAREN) 1 % GEL Apply 2 g topically 2 (two) times daily as needed (for shoulder pain). Qty: 100 g, Refills: 1   Associated Diagnoses: Encounter for immunization    glipiZIDE (GLUCOTROL XL) 2.5 MG 24 hr tablet Take 1 tablet (2.5 mg total) by mouth daily. Qty: 30 tablet, Refills: 5   Associated Diagnoses: Encounter for immunization    hydrochlorothiazide (MICROZIDE) 12.5 MG capsule Take 1 capsule (12.5 mg total) by mouth daily. Qty: 30 capsule, Refills: 5   Associated Diagnoses: Encounter for immunization    mirtazapine (REMERON) 15 MG tablet Take 15 mg by mouth at bedtime as needed (for sleep).  pantoprazole (PROTONIX) 40 MG tablet Take 1 tablet (40 mg total) by mouth daily. Qty: 30 tablet, Refills: 5   Associated Diagnoses: Encounter for immunization    prochlorperazine (COMPAZINE) 10 MG tablet Take 1 tablet (10 mg total) by mouth every 6 (six) hours as needed for nausea or vomiting. Qty: 30 tablet, Refills: 0    traMADol (ULTRAM) 50 MG tablet Take 25-50 mg by mouth every 6 (six) hours as needed for moderate pain.     dexamethasone (DECADRON) 4 MG  tablet Take 20 mg by mouth every Monday.      STOP taking these medications     lisinopril (PRINIVIL,ZESTRIL) 5 MG tablet      potassium chloride SA (K-DUR,KLOR-CON) 20 MEQ tablet        No Known Allergies    The results of significant diagnostics from this hospitalization (including imaging, microbiology, ancillary and laboratory) are listed below for reference.    Significant Diagnostic Studies: Ct Head Wo Contrast  Result Date: 07/30/2016 CLINICAL DATA:  Generalized weakness, confusion, history of multiple myeloma EXAM: CT HEAD WITHOUT CONTRAST TECHNIQUE: Contiguous axial images were obtained from the base of the skull through the vertex without intravenous contrast. COMPARISON:  09/10/2015 FINDINGS: Brain: Stable brain atrophy pattern and chronic white matter microvascular ischemic changes throughout the white matter. No acute intracranial hemorrhage, mass lesion, new infarction, midline shift, herniation, hydrocephalus, or extra-axial fluid collection. Cerebellar atrophy as well. Cisterns remain patent. Vascular: No hyperdense vessel or unexpected calcification. Skull: Chronic lytic foci throughout the skull as before. Findings compatible with known myeloma. Negative for fracture or acute osseous finding. Sinuses/Orbits: No acute finding. Other: None. IMPRESSION: Stable atrophy and chronic white matter microvascular ischemic changes. No interval change or acute process by noncontrast CT. Electronically Signed   By: Jerilynn Mages.  Shick M.D.   On: 07/30/2016 16:02   Dg Knee Complete 4 Views Right  Result Date: 07/28/2016 CLINICAL DATA:  Right knee pain, fall EXAM: RIGHT KNEE - COMPLETE 4+ VIEW COMPARISON:  03/29/2010 FINDINGS: Four views of the right knee submitted. No acute fracture or subluxation. There is significant narrowing of medial joint compartment. Spurring of medial femoral condyle and medial tibial plateau. Narrowing of patellofemoral joint space. Atherosclerotic calcifications of  femoral and popliteal artery. IMPRESSION: No acute fracture or subluxation. Osteoarthritic changes with progression from prior exam. Electronically Signed   By: Lahoma Crocker M.D.   On: 07/28/2016 15:32    Microbiology: No results found for this or any previous visit (from the past 240 hour(s)).   Labs: Basic Metabolic Panel:  Recent Labs Lab 08/18/16 1229 08/19/16 0458 08/20/16 0528  NA 145 141 141  K 4.8 4.1 3.5  CL  --  108 111  CO2 _0 GLUCOSE 65* 88 105*  BUN 41.1* 36* 28*  CREATININE 2.8* 2.39* 1.96*  CALCIUM 16.0* 12.8* 11.1*   Liver Function Tests:  Recent Labs Lab 08/18/16 1229 08/19/16 0458  AST 118* 92*  ALT 13 14  ALKPHOS 98 74  BILITOT 0.23 0.4  PROT 7.8 6.8  ALBUMIN 3.6 3.6   No results for input(s): LIPASE, AMYLASE in the last 168 hours. No results for input(s): AMMONIA in the last 168 hours. CBC:  Recent Labs Lab 08/18/16 1229 08/19/16 0458  WBC 7.4 6.0  NEUTROABS 4.5  --   HGB 7.4* 7.2*  HCT 23.7* 22.0*  MCV 84.6 84.0  PLT 165 180   Cardiac Enzymes: No results for input(s): CKTOTAL, CKMB, CKMBINDEX, TROPONINI in the last  168 hours. BNP: BNP (last 3 results) No results for input(s): BNP in the last 8760 hours.  ProBNP (last 3 results) No results for input(s): PROBNP in the last 8760 hours.  CBG:  Recent Labs Lab 08/19/16 1153 08/19/16 1704 08/19/16 2021 08/20/16 0749 08/20/16 1155  GLUCAP 74 94 120* 94 102*    Signed:  Velvet Bathe MD.  [facility] 08/20/2016, 2:38 PM

## 2016-08-20 NOTE — Telephone Encounter (Signed)
Pt to be released today .  I told Orson Slick that her coumadin dose should be 5 mg mon,wed , fri and 2.5 mg other days.

## 2016-08-20 NOTE — Progress Notes (Signed)
ANTICOAGULATION CONSULT NOTE -  Pharmacy Consult for Warfarin Indication: Recurrent DVTs  No Known Allergies  Patient Measurements: Height: '5\' 2"'$  (157.5 cm) Weight: 100 lb (45.4 kg) IBW/kg (Calculated) : 50.1 Heparin Dosing Weight:   Vital Signs: Temp: 99.2 F (37.3 C) (12/20 0453) Temp Source: Oral (12/20 0453) BP: 161/67 (12/20 0453) Pulse Rate: 80 (12/20 0453)  Labs:  Recent Labs  08/18/16 1229 08/18/16 1229 08/19/16 0458 08/20/16 0528  HGB 7.4*  --  7.2*  --   HCT 23.7*  --  22.0*  --   PLT 165  --  180  --   LABPROT  --   --  37.1* 37.5*  INR 4.10*  --  3.64 3.69  CREATININE  --  2.8* 2.39*  --     Estimated Creatinine Clearance: 13.9 mL/min (by C-G formula based on SCr of 2.39 mg/dL (H)).   Medical History: Past Medical History:  Diagnosis Date  . Anemia   . Anxiety    takes Xanax daily as needed  . Blood transfusion   . Cardiomyopathy ([place])   . Deep vein thrombosis of bilateral lower extremities (HCC)   . Degenerative joint disease   . Depression    Mild  . Diabetes mellitus    takes Glipizide daily  . Family history of anesthesia complication    DAUGHTER CPR AFTER  . GERD (gastroesophageal reflux disease)    takes Protonix daily  . Heart murmur   . History of chicken pox   . History of radiation therapy 08/30/13-09/20/13   20 gray to lower lumbar/upper sacrum  . History of shingles   . Hyperlipidemia   . Hypertension    takes Amlodipine,HCTZ,and Lisinopril daily  . Multiple myeloma    per Dr. Julien Nordmann, s/p palliative radiation for leg pain 2011 per Dr. Sondra Come  . Phlebitis   . UTI (urinary tract infection) 09/08/2013    Medications:  PTA warfarin '5mg'$  daily, LD 12/17 @ 1800  Assessment: 27 yoF with hx MM, recurrent DVTs on chronic warfarin, and DM presents with weakness, hypercalcemia, and supratherapeutic INR.  Pharmacy consulted to dose warfarin inpatient.    08/20/2016  INR = 3.69, supratherapeutic No drug interactions noted Pt  with decreased appetite, eating ~ 50% of meals   Goal of Therapy:  INR 2-3 Monitor platelets by anticoagulation protocol: Yes   Plan:  Hold warfarin tonight.   Daily PT/INR  Dolly Rias RPh 08/20/2016, 7:46 AM Pager [phone]

## 2016-08-20 NOTE — Progress Notes (Signed)
D/C education completed. Answered questions. Patient will be D/C home in stable condition with family.

## 2016-08-21 ENCOUNTER — Encounter: Payer: Self-pay | Admitting: Vascular Surgery

## 2016-08-22 ENCOUNTER — Other Ambulatory Visit: Payer: Self-pay | Admitting: Medical Oncology

## 2016-08-22 DIAGNOSIS — C9000 Multiple myeloma not having achieved remission: Secondary | ICD-10-CM

## 2016-08-26 ENCOUNTER — Ambulatory Visit (HOSPITAL_COMMUNITY)
Admission: RE | Admit: 2016-08-26 | Discharge: 2016-08-26 | Disposition: A | Discharge status: Home / Self Care | Payer: Medicare Other | Source: Ambulatory Visit | Attending: Internal Medicine | Admitting: Internal Medicine

## 2016-08-26 ENCOUNTER — Telehealth: Payer: Self-pay

## 2016-08-26 ENCOUNTER — Other Ambulatory Visit: Payer: Self-pay | Admitting: Emergency Medicine

## 2016-08-26 ENCOUNTER — Ambulatory Visit (HOSPITAL_BASED_OUTPATIENT_CLINIC_OR_DEPARTMENT_OTHER): Payer: Medicare Other

## 2016-08-26 ENCOUNTER — Other Ambulatory Visit: Payer: Self-pay | Admitting: Internal Medicine

## 2016-08-26 ENCOUNTER — Other Ambulatory Visit (HOSPITAL_BASED_OUTPATIENT_CLINIC_OR_DEPARTMENT_OTHER): Payer: Medicare Other

## 2016-08-26 VITALS — BP 171/64 | HR 74 | Temp 98.1°F | Resp 17

## 2016-08-26 DIAGNOSIS — Z5111 Encounter for antineoplastic chemotherapy: Secondary | ICD-10-CM | POA: Diagnosis not present

## 2016-08-26 DIAGNOSIS — C9000 Multiple myeloma not having achieved remission: Secondary | ICD-10-CM

## 2016-08-26 DIAGNOSIS — Z7901 Long term (current) use of anticoagulants: Secondary | ICD-10-CM | POA: Diagnosis not present

## 2016-08-26 DIAGNOSIS — Z5112 Encounter for antineoplastic immunotherapy: Secondary | ICD-10-CM

## 2016-08-26 DIAGNOSIS — C9002 Multiple myeloma in relapse: Secondary | ICD-10-CM | POA: Diagnosis not present

## 2016-08-26 LAB — COMPREHENSIVE METABOLIC PANEL
ALBUMIN: 3.4 g/dL — AB (ref 3.5–5.0)
ALK PHOS: 101 U/L (ref 40–150)
ALT: 16 U/L (ref 0–55)
AST: 144 U/L — ABNORMAL HIGH (ref 5–34)
Anion Gap: 15 mEq/L — ABNORMAL HIGH (ref 3–11)
BUN: 52.1 mg/dL — ABNORMAL HIGH (ref 7.0–26.0)
CHLORIDE: 112 meq/L — AB (ref 98–109)
CO2: 21 meq/L — AB (ref 22–29)
Calcium: 13.4 mg/dL (ref 8.4–10.4)
Creatinine: 3.4 mg/dL (ref 0.6–1.1)
EGFR: 14 mL/min/{1.73_m2} — AB (ref >90)
GLUCOSE: 91 mg/dL (ref 70–140)
POTASSIUM: 3.5 meq/L (ref 3.5–5.1)
SODIUM: 148 meq/L — AB (ref 136–145)
Total Bilirubin: 0.25 mg/dL (ref 0.20–1.20)
Total Protein: 7.2 g/dL (ref 6.4–8.3)

## 2016-08-26 LAB — CBC WITH DIFFERENTIAL/PLATELET
BASO%: 0.3 % (ref 0.0–2.0)
BASOS ABS: 0 10*3/uL (ref 0.0–0.1)
EOS ABS: 0 10*3/uL (ref 0.0–0.5)
EOS%: 0.4 % (ref 0.0–7.0)
HCT: 19.4 % — ABNORMAL LOW (ref 34.8–46.6)
HGB: 6.5 g/dL — CL (ref 11.6–15.9)
LYMPH%: 21.5 % (ref 14.0–49.7)
MCH: 27.7 pg (ref 25.1–34.0)
MCHC: 33.7 g/dL (ref 31.5–36.0)
MCV: 82.1 fL (ref 79.5–101.0)
MONO#: 0.4 10*3/uL (ref 0.1–0.9)
MONO%: 5.2 % (ref 0.0–14.0)
NEUT#: 5.4 10*3/uL (ref 1.5–6.5)
NEUT%: 72.6 % (ref 38.4–76.8)
Platelets: 175 10*3/uL (ref 145–400)
RBC: 2.36 10*6/uL — AB (ref 3.70–5.45)
RDW: 17.4 % — ABNORMAL HIGH (ref 11.2–14.5)
WBC: 7.4 10*3/uL (ref 3.9–10.3)
lymph#: 1.6 10*3/uL (ref 0.9–3.3)

## 2016-08-26 LAB — TECHNOLOGIST REVIEW

## 2016-08-26 LAB — PREPARE RBC (CROSSMATCH)

## 2016-08-26 LAB — PROTIME-INR
INR: 4.3 — ABNORMAL HIGH (ref 2.00–3.50)
Protime: 51.6 Seconds — ABNORMAL HIGH (ref 10.6–13.4)

## 2016-08-26 MED ORDER — SODIUM CHLORIDE 0.9 % IV SOLN
Freq: Once | INTRAVENOUS | Status: AC
  Administered 2016-08-26: 15:00:00 via INTRAVENOUS

## 2016-08-26 MED ORDER — DEXTROSE 5 % IV SOLN
36.0000 mg/m2 | Freq: Once | INTRAVENOUS | Status: AC
  Administered 2016-08-26: 54 mg via INTRAVENOUS
  Filled 2016-08-26: qty 27

## 2016-08-26 MED ORDER — DEXAMETHASONE SODIUM PHOSPHATE 10 MG/ML IJ SOLN
INTRAMUSCULAR | Status: AC
  Filled 2016-08-26: qty 1

## 2016-08-26 MED ORDER — ONDANSETRON HCL 8 MG PO TABS
8.0000 mg | ORAL_TABLET | Freq: Once | ORAL | Status: AC
  Administered 2016-08-26: 8 mg via ORAL

## 2016-08-26 MED ORDER — SODIUM CHLORIDE 0.9 % IV SOLN
Freq: Once | INTRAVENOUS | Status: AC
  Administered 2016-08-26: 16:00:00 via INTRAVENOUS

## 2016-08-26 MED ORDER — ZOLEDRONIC ACID 4 MG/100ML IV SOLN
4.0000 mg | Freq: Once | INTRAVENOUS | Status: AC
  Administered 2016-08-26: 4 mg via INTRAVENOUS
  Filled 2016-08-26: qty 100

## 2016-08-26 MED ORDER — DEXAMETHASONE SODIUM PHOSPHATE 10 MG/ML IJ SOLN
10.0000 mg | Freq: Once | INTRAMUSCULAR | Status: AC
  Administered 2016-08-26: 10 mg via INTRAVENOUS

## 2016-08-26 MED ORDER — ONDANSETRON HCL 8 MG PO TABS
ORAL_TABLET | ORAL | Status: AC
  Filled 2016-08-26: qty 1

## 2016-08-26 MED ORDER — SODIUM CHLORIDE 0.9 % IV SOLN
300.0000 mg/m2 | Freq: Once | INTRAVENOUS | Status: AC
  Administered 2016-08-26: 440 mg via INTRAVENOUS
  Filled 2016-08-26: qty 22

## 2016-08-26 NOTE — Progress Notes (Signed)
IV site R antecubital clotted & infiltrated when tried to flush.  IV d/c'd intact & restarted in R antecubital.  Cytoxan restarted.

## 2016-08-26 NOTE — Patient Instructions (Addendum)
[facility] Discharge Instructions for Patients Receiving Chemotherapy  Today you received the following chemotherapy agents Cytoxan and Kyprolis  To help prevent nausea and vomiting after your treatment, we encourage you to take your nausea medication as directed   If you develop nausea and vomiting that is not controlled by your nausea medication, call the clinic.   BELOW ARE SYMPTOMS THAT SHOULD BE REPORTED IMMEDIATELY:  *FEVER GREATER THAN 100.5 F  *CHILLS WITH OR WITHOUT FEVER  NAUSEA AND VOMITING THAT IS NOT CONTROLLED WITH YOUR NAUSEA MEDICATION  *UNUSUAL SHORTNESS OF BREATH  *UNUSUAL BRUISING OR BLEEDING  TENDERNESS IN MOUTH AND THROAT WITH OR WITHOUT PRESENCE OF ULCERS  *URINARY PROBLEMS  *BOWEL PROBLEMS  UNUSUAL RASH Items with * indicate a potential emergency and should be followed up as soon as possible.  Feel free to call the clinic you have any questions or concerns. The clinic phone number is [phone].  Please show the CHEMO ALERT CARD at check-in to the Emergency Department and triage nurse.   

## 2016-08-26 NOTE — Progress Notes (Signed)
Ok to treat per Dr Julien Nordmann with Hgb 6.5; will transfuse 2 units PRBC's ( one today and one tomorrow ) Ok to treat with calcium 13.4 and creatinine 3.4. Will receive Zometa today per Dr Julien Nordmann.   Per Dr Julien Nordmann; patient to take coumadin 2.5 MG daily.

## 2016-08-26 NOTE — Telephone Encounter (Signed)
Gina with [facility] left v/m requesting verbal orders to resume PT home care of pt. Pt was recently in the hospital, [name] does not know why in hospital.Please advise.

## 2016-08-26 NOTE — Progress Notes (Signed)
Upon starting the infusion for cytoxan, the iv pump alarmed "ocluded-patient side" . Attempted to flush IV site with normal saline but site was determined to no longer be patent. This site, in the left forearm, was discontinued and a new site accessed in the right forearm.

## 2016-08-27 ENCOUNTER — Ambulatory Visit: Payer: Medicare Other | Admitting: Vascular Surgery

## 2016-08-27 ENCOUNTER — Ambulatory Visit (HOSPITAL_BASED_OUTPATIENT_CLINIC_OR_DEPARTMENT_OTHER): Payer: Medicare Other

## 2016-08-27 ENCOUNTER — Encounter (HOSPITAL_COMMUNITY): Payer: Medicare Other

## 2016-08-27 VITALS — BP 128/49 | HR 65 | Temp 98.4°F | Resp 18

## 2016-08-27 DIAGNOSIS — C9000 Multiple myeloma not having achieved remission: Secondary | ICD-10-CM | POA: Diagnosis not present

## 2016-08-27 DIAGNOSIS — Z5111 Encounter for antineoplastic chemotherapy: Secondary | ICD-10-CM

## 2016-08-27 MED ORDER — ONDANSETRON HCL 8 MG PO TABS
ORAL_TABLET | ORAL | Status: AC
  Filled 2016-08-27: qty 1

## 2016-08-27 MED ORDER — ACETAMINOPHEN 325 MG PO TABS
ORAL_TABLET | ORAL | Status: AC
  Filled 2016-08-27: qty 2

## 2016-08-27 MED ORDER — DEXTROSE 5 % IV SOLN
36.0000 mg/m2 | Freq: Once | INTRAVENOUS | Status: AC
  Administered 2016-08-27: 54 mg via INTRAVENOUS
  Filled 2016-08-27: qty 27

## 2016-08-27 MED ORDER — DEXTROSE 5 % IV SOLN: 36.0000 mg/m2 | Freq: Once | INTRAVENOUS | Status: DC

## 2016-08-27 MED ORDER — DEXAMETHASONE SODIUM PHOSPHATE 10 MG/ML IJ SOLN
10.0000 mg | Freq: Once | INTRAMUSCULAR | Status: AC
  Administered 2016-08-27: 10 mg via INTRAVENOUS

## 2016-08-27 MED ORDER — SODIUM CHLORIDE 0.9 % IV SOLN
Freq: Once | INTRAVENOUS | Status: AC
  Administered 2016-08-27: 16:00:00 via INTRAVENOUS

## 2016-08-27 MED ORDER — SODIUM CHLORIDE 0.9 % IV SOLN
250.0000 mL | Freq: Once | INTRAVENOUS | Status: AC
  Administered 2016-08-27: 250 mL via INTRAVENOUS

## 2016-08-27 MED ORDER — DIPHENHYDRAMINE HCL 25 MG PO CAPS
ORAL_CAPSULE | ORAL | Status: AC
  Filled 2016-08-27: qty 1

## 2016-08-27 MED ORDER — DIPHENHYDRAMINE HCL 25 MG PO CAPS
25.0000 mg | ORAL_CAPSULE | Freq: Once | ORAL | Status: AC
  Administered 2016-08-27: 25 mg via ORAL

## 2016-08-27 MED ORDER — ONDANSETRON HCL 8 MG PO TABS
8.0000 mg | ORAL_TABLET | Freq: Once | ORAL | Status: AC
  Administered 2016-08-27: 8 mg via ORAL

## 2016-08-27 MED ORDER — DEXAMETHASONE SODIUM PHOSPHATE 10 MG/ML IJ SOLN
INTRAMUSCULAR | Status: AC
  Filled 2016-08-27: qty 1

## 2016-08-27 MED ORDER — ACETAMINOPHEN 325 MG PO TABS
650.0000 mg | ORAL_TABLET | Freq: Once | ORAL | Status: AC
  Administered 2016-08-27: 650 mg via ORAL

## 2016-08-27 NOTE — Progress Notes (Signed)
1515: pump reading "occluded patients side" blood paused. Unable to flush left AC PIV, blood return noted, unable to use PIV. PIV removed  1540: Rt Hand PIV started and blood restarted.  1548: Per Dr. Julien Nordmann okay to increase rate to 500 cc/hr. Rate increased.

## 2016-08-27 NOTE — Patient Instructions (Addendum)
[facility] Discharge Instructions for Patients Receiving Chemotherapy  Today you received the following chemotherapy agents: Kyprolis   To help prevent nausea and vomiting after your treatment, we encourage you to take your nausea medication as directed   If you develop nausea and vomiting that is not controlled by your nausea medication, call the clinic.   BELOW ARE SYMPTOMS THAT SHOULD BE REPORTED IMMEDIATELY:  *FEVER GREATER THAN 100.5 F  *CHILLS WITH OR WITHOUT FEVER  NAUSEA AND VOMITING THAT IS NOT CONTROLLED WITH YOUR NAUSEA MEDICATION  *UNUSUAL SHORTNESS OF BREATH  *UNUSUAL BRUISING OR BLEEDING  TENDERNESS IN MOUTH AND THROAT WITH OR WITHOUT PRESENCE OF ULCERS  *URINARY PROBLEMS  *BOWEL PROBLEMS  UNUSUAL RASH Items with * indicate a potential emergency and should be followed up as soon as possible.  Feel free to call the clinic you have any questions or concerns. The clinic phone number is [phone].  Please show the [place] at check-in to the Emergency Department and triage nurse.     Blood Transfusion , Adult A blood transfusion is a procedure in which you receive donated blood, including plasma, platelets, and red blood cells, through an IV tube. You may need a blood transfusion because of illness, surgery, or injury. The blood may come from a donor. You may also be able to donate blood for yourself (autologous blood donation) before a surgery if you know that you might require a blood transfusion. The blood given in a transfusion is made up of different types of cells. You may receive:  Red blood cells. These carry oxygen to the cells in the body.  White blood cells. These help you fight infections.  Platelets. These help your blood to clot.  Plasma. This is the liquid part of your blood and it helps with fluid imbalances. If you have hemophilia or another clotting disorder, you may also receive other types of blood  products. Tell a health care provider about:  Any allergies you have.  All medicines you are taking, including vitamins, herbs, eye drops, creams, and over-the-counter medicines.  Any problems you or family members have had with anesthetic medicines.  Any blood disorders you have.  Any surgeries you have had.  Any medical conditions you have, including any recent fever or cold symptoms.  Whether you are pregnant or may be pregnant.  Any previous reactions you have had during a blood transfusion. What are the risks? Generally, this is a safe procedure. However, problems may occur, including:  Having an allergic reaction to something in the donated blood. Hives and itching may be symptoms of this type of reaction.  Fever. This may be a reaction to the white blood cells in the transfused blood. Nausea or chest pain may accompany a fever.  Iron overload. This can happen from having many transfusions.  Transfusion-related acute lung injury (TRALI). This is a rare reaction that causes lung damage. The cause is not known.TRALI can occur within hours of a transfusion or several days later.  Sudden (acute) or delayed hemolytic reactions. This happens if your blood does not match the cells in your transfusion. Your body's defense system (immune system) may try to attack the new cells. This complication is rare. The symptoms include fever, chills, nausea, and low back pain or chest pain.  Infection or disease transmission. This is rare. What happens before the procedure?  You will have a blood test to determine your blood type. This is necessary to know what kind  of blood your body will accept and to match it to the donor blood.  If you are going to have a planned surgery, you may be able to do an autologous blood donation. This may be done in case you need to have a transfusion.  If you have had an allergic reaction to a transfusion in the past, you may be given medicine to help prevent  a reaction. This medicine may be given to you by mouth or through an IV tube.  You will have your temperature, blood pressure, and pulse monitored before the transfusion.  Follow instructions from your health care provider about eating and drinking restrictions.  Ask your health care provider about:  Changing or stopping your regular medicines. This is especially important if you are taking diabetes medicines or blood thinners.  Taking medicines such as aspirin and ibuprofen. These medicines can thin your blood. Do not take these medicines before your procedure if your health care provider instructs you not to. What happens during the procedure?  An IV tube will be inserted into one of your veins.  The bag of donated blood will be attached to your IV tube. The blood will then enter through your vein.  Your temperature, blood pressure, and pulse will be monitored regularly during the transfusion. This monitoring is done to detect early signs of a transfusion reaction.  If you have any signs or symptoms of a reaction, your transfusion will be stopped and you may be given medicine.  When the transfusion is complete, your IV tube will be removed.  Pressure may be applied to the IV site for a few minutes.  A bandage (dressing) will be applied. The procedure may vary among health care providers and hospitals. What happens after the procedure?  Your temperature, blood pressure, heart rate, breathing rate, and blood oxygen level will be monitored often.  Your blood may be tested to see how you are responding to the transfusion.  You may be warmed with fluids or blankets to maintain a normal body temperature. Summary  A blood transfusion is a procedure in which you receive donated blood, including plasma, platelets, and red blood cells, through an IV tube.  Your temperature, blood pressure, and pulse will be monitored before, during, and after the transfusion.  Your blood may be tested  after the transfusion to see how your body has responded. This information is not intended to replace advice given to you by your health care provider. Make sure you discuss any questions you have with your health care provider. Document Released: 08/15/2000 Document Revised: 05/15/2016 Document Reviewed: 05/15/2016 Elsevier Interactive Patient Education  2017 [employer].

## 2016-08-28 LAB — TYPE AND SCREEN
BLOOD PRODUCT EXPIRATION DATE: 201801122359
Blood Product Expiration Date: 201801102359
ISSUE DATE / TIME: 201712271132
ISSUE DATE / TIME: 201712271132
Unit Type and Rh: 7300
Unit Type and Rh: 7300

## 2016-08-28 NOTE — Telephone Encounter (Signed)
Please give the order.  Was inpatient due to hypercalcemia related to malignancy.  Thanks.

## 2016-08-28 NOTE — Telephone Encounter (Signed)
Gina advised.

## 2016-08-29 ENCOUNTER — Emergency Department (HOSPITAL_COMMUNITY): Payer: Medicare Other

## 2016-08-29 ENCOUNTER — Telehealth: Payer: Self-pay

## 2016-08-29 ENCOUNTER — Other Ambulatory Visit: Payer: Self-pay

## 2016-08-29 ENCOUNTER — Encounter (HOSPITAL_COMMUNITY): Payer: Self-pay | Admitting: Emergency Medicine

## 2016-08-29 ENCOUNTER — Telehealth: Payer: Self-pay | Admitting: Medical Oncology

## 2016-08-29 ENCOUNTER — Other Ambulatory Visit: Payer: Self-pay | Admitting: Medical Oncology

## 2016-08-29 ENCOUNTER — Inpatient Hospital Stay (HOSPITAL_COMMUNITY)
Admission: EM | Admit: 2016-08-29 | Discharge: 2016-09-05 | DRG: 637 | Disposition: A | Discharge status: Hospice | Payer: Medicare Other | Attending: Internal Medicine | Admitting: Internal Medicine

## 2016-08-29 DIAGNOSIS — F0391 Unspecified dementia with behavioral disturbance: Secondary | ICD-10-CM | POA: Diagnosis present

## 2016-08-29 DIAGNOSIS — E785 Hyperlipidemia, unspecified: Secondary | ICD-10-CM | POA: Diagnosis present

## 2016-08-29 DIAGNOSIS — Z7901 Long term (current) use of anticoagulants: Secondary | ICD-10-CM

## 2016-08-29 DIAGNOSIS — T383X1D Poisoning by insulin and oral hypoglycemic [antidiabetic] drugs, accidental (unintentional), subsequent encounter: Secondary | ICD-10-CM | POA: Diagnosis not present

## 2016-08-29 DIAGNOSIS — R680 Hypothermia, not associated with low environmental temperature: Secondary | ICD-10-CM | POA: Diagnosis present

## 2016-08-29 DIAGNOSIS — R918 Other nonspecific abnormal finding of lung field: Secondary | ICD-10-CM | POA: Diagnosis not present

## 2016-08-29 DIAGNOSIS — E161 Other hypoglycemia: Secondary | ICD-10-CM | POA: Diagnosis not present

## 2016-08-29 DIAGNOSIS — E872 Acidosis, unspecified: Secondary | ICD-10-CM | POA: Diagnosis present

## 2016-08-29 DIAGNOSIS — E2740 Unspecified adrenocortical insufficiency: Secondary | ICD-10-CM | POA: Diagnosis not present

## 2016-08-29 DIAGNOSIS — Z7189 Other specified counseling: Secondary | ICD-10-CM

## 2016-08-29 DIAGNOSIS — E160 Drug-induced hypoglycemia without coma: Secondary | ICD-10-CM | POA: Diagnosis present

## 2016-08-29 DIAGNOSIS — N183 Chronic kidney disease, stage 3 unspecified: Secondary | ICD-10-CM | POA: Diagnosis present

## 2016-08-29 DIAGNOSIS — K219 Gastro-esophageal reflux disease without esophagitis: Secondary | ICD-10-CM | POA: Diagnosis not present

## 2016-08-29 DIAGNOSIS — E162 Hypoglycemia, unspecified: Secondary | ICD-10-CM | POA: Diagnosis not present

## 2016-08-29 DIAGNOSIS — F419 Anxiety disorder, unspecified: Secondary | ICD-10-CM | POA: Diagnosis present

## 2016-08-29 DIAGNOSIS — R7309 Other abnormal glucose: Secondary | ICD-10-CM | POA: Diagnosis not present

## 2016-08-29 DIAGNOSIS — R222 Localized swelling, mass and lump, trunk: Secondary | ICD-10-CM | POA: Diagnosis present

## 2016-08-29 DIAGNOSIS — L03113 Cellulitis of right upper limb: Secondary | ICD-10-CM | POA: Diagnosis present

## 2016-08-29 DIAGNOSIS — Z66 Do not resuscitate: Secondary | ICD-10-CM | POA: Diagnosis not present

## 2016-08-29 DIAGNOSIS — D696 Thrombocytopenia, unspecified: Secondary | ICD-10-CM | POA: Diagnosis not present

## 2016-08-29 DIAGNOSIS — R531 Weakness: Secondary | ICD-10-CM

## 2016-08-29 DIAGNOSIS — Z515 Encounter for palliative care: Secondary | ICD-10-CM | POA: Diagnosis not present

## 2016-08-29 DIAGNOSIS — Z86711 Personal history of pulmonary embolism: Secondary | ICD-10-CM

## 2016-08-29 DIAGNOSIS — C9002 Multiple myeloma in relapse: Secondary | ICD-10-CM | POA: Diagnosis present

## 2016-08-29 DIAGNOSIS — Z7952 Long term (current) use of systemic steroids: Secondary | ICD-10-CM

## 2016-08-29 DIAGNOSIS — F05 Delirium due to known physiological condition: Secondary | ICD-10-CM | POA: Diagnosis present

## 2016-08-29 DIAGNOSIS — T383X1A Poisoning by insulin and oral hypoglycemic [antidiabetic] drugs, accidental (unintentional), initial encounter: Secondary | ICD-10-CM

## 2016-08-29 DIAGNOSIS — R410 Disorientation, unspecified: Secondary | ICD-10-CM

## 2016-08-29 DIAGNOSIS — Z9221 Personal history of antineoplastic chemotherapy: Secondary | ICD-10-CM

## 2016-08-29 DIAGNOSIS — I429 Cardiomyopathy, unspecified: Secondary | ICD-10-CM | POA: Diagnosis not present

## 2016-08-29 DIAGNOSIS — Z923 Personal history of irradiation: Secondary | ICD-10-CM | POA: Diagnosis not present

## 2016-08-29 DIAGNOSIS — Z89512 Acquired absence of left leg below knee: Secondary | ICD-10-CM

## 2016-08-29 DIAGNOSIS — G9340 Encephalopathy, unspecified: Secondary | ICD-10-CM | POA: Diagnosis not present

## 2016-08-29 DIAGNOSIS — J189 Pneumonia, unspecified organism: Secondary | ICD-10-CM | POA: Diagnosis present

## 2016-08-29 DIAGNOSIS — R609 Edema, unspecified: Secondary | ICD-10-CM

## 2016-08-29 DIAGNOSIS — E1122 Type 2 diabetes mellitus with diabetic chronic kidney disease: Secondary | ICD-10-CM | POA: Diagnosis present

## 2016-08-29 DIAGNOSIS — N179 Acute kidney failure, unspecified: Secondary | ICD-10-CM | POA: Diagnosis not present

## 2016-08-29 DIAGNOSIS — Z681 Body mass index (BMI) 19 or less, adult: Secondary | ICD-10-CM

## 2016-08-29 DIAGNOSIS — Z87891 Personal history of nicotine dependence: Secondary | ICD-10-CM

## 2016-08-29 DIAGNOSIS — Z86718 Personal history of other venous thrombosis and embolism: Secondary | ICD-10-CM

## 2016-08-29 DIAGNOSIS — E43 Unspecified severe protein-calorie malnutrition: Secondary | ICD-10-CM | POA: Diagnosis present

## 2016-08-29 DIAGNOSIS — E119 Type 2 diabetes mellitus without complications: Secondary | ICD-10-CM

## 2016-08-29 DIAGNOSIS — Z8249 Family history of ischemic heart disease and other diseases of the circulatory system: Secondary | ICD-10-CM

## 2016-08-29 DIAGNOSIS — Z79899 Other long term (current) drug therapy: Secondary | ICD-10-CM

## 2016-08-29 DIAGNOSIS — G9341 Metabolic encephalopathy: Secondary | ICD-10-CM | POA: Diagnosis not present

## 2016-08-29 DIAGNOSIS — C9000 Multiple myeloma not having achieved remission: Secondary | ICD-10-CM | POA: Diagnosis not present

## 2016-08-29 DIAGNOSIS — R791 Abnormal coagulation profile: Secondary | ICD-10-CM | POA: Diagnosis present

## 2016-08-29 DIAGNOSIS — E860 Dehydration: Secondary | ICD-10-CM | POA: Diagnosis present

## 2016-08-29 DIAGNOSIS — N08 Glomerular disorders in diseases classified elsewhere: Secondary | ICD-10-CM

## 2016-08-29 DIAGNOSIS — E11649 Type 2 diabetes mellitus with hypoglycemia without coma: Principal | ICD-10-CM | POA: Diagnosis present

## 2016-08-29 DIAGNOSIS — E876 Hypokalemia: Secondary | ICD-10-CM | POA: Diagnosis present

## 2016-08-29 DIAGNOSIS — I10 Essential (primary) hypertension: Secondary | ICD-10-CM | POA: Diagnosis present

## 2016-08-29 DIAGNOSIS — D72819 Decreased white blood cell count, unspecified: Secondary | ICD-10-CM

## 2016-08-29 DIAGNOSIS — I878 Other specified disorders of veins: Secondary | ICD-10-CM

## 2016-08-29 DIAGNOSIS — M7989 Other specified soft tissue disorders: Secondary | ICD-10-CM | POA: Diagnosis not present

## 2016-08-29 DIAGNOSIS — A419 Sepsis, unspecified organism: Secondary | ICD-10-CM

## 2016-08-29 DIAGNOSIS — R4182 Altered mental status, unspecified: Secondary | ICD-10-CM

## 2016-08-29 DIAGNOSIS — T383X5A Adverse effect of insulin and oral hypoglycemic [antidiabetic] drugs, initial encounter: Secondary | ICD-10-CM | POA: Diagnosis present

## 2016-08-29 DIAGNOSIS — Z823 Family history of stroke: Secondary | ICD-10-CM

## 2016-08-29 DIAGNOSIS — Z833 Family history of diabetes mellitus: Secondary | ICD-10-CM

## 2016-08-29 LAB — I-STAT CG4 LACTIC ACID, ED: Lactic Acid, Venous: 2.61 mmol/L (ref 0.5–1.9)

## 2016-08-29 LAB — CBG MONITORING, ED: GLUCOSE-CAPILLARY: 139 mg/dL — AB (ref 65–99)

## 2016-08-29 MED ORDER — PIPERACILLIN-TAZOBACTAM 3.375 G IVPB 30 MIN
3.3750 g | Freq: Once | INTRAVENOUS | Status: AC
  Administered 2016-08-29: 3.375 g via INTRAVENOUS
  Filled 2016-08-29: qty 50

## 2016-08-29 MED ORDER — SODIUM CHLORIDE 0.9 % IV BOLUS (SEPSIS)
500.0000 mL | Freq: Once | INTRAVENOUS | Status: AC
  Administered 2016-08-30: 500 mL via INTRAVENOUS

## 2016-08-29 MED ORDER — SODIUM CHLORIDE 0.9 % IV SOLN
1000.0000 mL | INTRAVENOUS | Status: DC
  Administered 2016-08-29: 1000 mL via INTRAVENOUS

## 2016-08-29 MED ORDER — SODIUM CHLORIDE 0.9 % IV BOLUS (SEPSIS)
1000.0000 mL | Freq: Once | INTRAVENOUS | Status: AC
  Administered 2016-08-29: 1000 mL via INTRAVENOUS

## 2016-08-29 MED ORDER — VANCOMYCIN HCL IN DEXTROSE 1-5 GM/200ML-% IV SOLN
1000.0000 mg | Freq: Once | INTRAVENOUS | Status: AC
  Administered 2016-08-30: 1000 mg via INTRAVENOUS
  Filled 2016-08-29: qty 200

## 2016-08-29 NOTE — ED Provider Notes (Signed)
[facility] DEPT Provider Note   CSN: [id number] Arrival date & time: 08/29/16  2248  By signing my name below, I, Ephriam Jenkins, attest that this documentation has been prepared under the direction and in the presence of Jarrett Soho Emmalynn Pinkham PA-C  Electronically Signed: Ephriam Jenkins, ED Scribe. 08/29/16. 11:00 PM.  History   Chief Complaint Chief Complaint  Patient presents with  . Hypoglycemia    HPI HPI Comments: LEVEL 5 CAVEAT DUE TO MENTAL STATUS [name] is a 80 y.o. female, with Hx of DM, brought in by ambulance, who presents to the Emergency Department after being found unresponsive tonight. Pt was found unresponsive by family, who called EMS. On EMS arrival pt's CBG was 32. She was alert but not oriented. In route, EMS gave D10 and CBG improved to 258 on arrival to the ED here. Per EMS, family stated that she has been lethargic lately and usually controls her DM with her diet and with medications. Family also stated that within the past two weeks she has not been eating as much as she should be. Pt states that she is unsure if she took her medication today.   The history is provided by the EMS personnel and medical records. No language interpreter was used.    Past Medical History:  Diagnosis Date  . Anemia   . Anxiety    takes Xanax daily as needed  . Blood transfusion   . Cardiomyopathy ([place])   . Deep vein thrombosis of bilateral lower extremities (HCC)   . Degenerative joint disease   . Depression    Mild  . Diabetes mellitus    takes Glipizide daily  . Family history of anesthesia complication    DAUGHTER CPR AFTER  . GERD (gastroesophageal reflux disease)    takes Protonix daily  . Heart murmur   . History of chicken pox   . History of radiation therapy 08/30/13-09/20/13   20 gray to lower lumbar/upper sacrum  . History of shingles   . Hyperlipidemia   . Hypertension    takes Amlodipine,HCTZ,and Lisinopril daily  . Multiple myeloma    per Dr.  Julien Nordmann, s/p palliative radiation for leg pain 2011 per Dr. Sondra Come  . Phlebitis   . UTI (urinary tract infection) 09/08/2013    Patient Active Problem List   Diagnosis Date Noted  . Hypercalcemia of malignancy 08/18/2016  . Anemia 07/29/2016  . Hypercalcemia 07/29/2016  . Knee pain 07/29/2016  . Rib pain 07/22/2016  . Foot pain 07/21/2016  . Pain in joint, shoulder region 06/12/2016  . Wound dehiscence 11/16/2015  . Anxiety state   . Chronic anticoagulation   . History of DVT (deep vein thrombosis)   . Acute blood loss anemia   . Post-operative pain   . Status post below knee amputation of left lower extremity ([place]) 10/12/2015  . PAD (peripheral artery disease) ([place]) 10/09/2015  . Dry gangrene ([place]) 09/13/2015  . PVD (peripheral vascular disease) ([place]) 09/09/2015  . Encounter for antineoplastic chemotherapy 06/26/2015  . Chronic pain 02/07/2015  . Hypokalemia 11/28/2014  . Long term current use of anticoagulant therapy 11/28/2014  . Fissure in skin of foot 11/28/2014  . Sinus bradycardia 09/08/2014  . DNR no code (do not resuscitate) 09/08/2013  . Chronic steroid use 09/08/2013  . Deep vein thrombosis of left lower extremity ([place]) 05/12/2013  . Deep vein thrombosis of right lower extremity ([place]) 05/12/2013  . Left hip pain 05/16/2012  . Gait instability 05/08/2011  . Multiple myeloma ([place])  03/29/2010  . Diabetes mellitus without complication ([place]) 28/31/5176  . DEPRESSION, MILD 03/29/2010  . LEG PAIN, RIGHT 03/29/2010  . Hyperlipidemia 05/19/2008  . Essential hypertension 05/19/2008  . Osteoarthritis 05/19/2008    Past Surgical History:  Procedure Laterality Date  . ABDOMINAL HYSTERECTOMY    . AMPUTATION Left 10/09/2015   Procedure: Left Leg AMPUTATION BELOW KNEE;  Surgeon: Angelia Mould, MD;  Location: [facility];  Service: Vascular;  Laterality: Left;  . ANKLE FRACTURE SURGERY Left   . APPLICATION OF WOUND VAC Left 11/16/2015   Procedure: APPLICATION OF WOUND VAC  LEFT BELOW THE KNEE AMPUTEE.;  Surgeon: Angelia Mould, MD;  Location: [facility];  Service: Vascular;  Laterality: Left;  . COLONOSCOPY    . EAR CYST EXCISION Left 04/16/2015   Procedure: EXCISION FACIAL CYST LEFT SIDE ;  Surgeon: Izora Gala, MD;  Location: [facility];  Service: ENT;  Laterality: Left;  . EYE SURGERY Bilateral    cataract surgery  . I&D EXTREMITY Left 11/16/2015   Procedure: DEBRIDEMENT OF LEFT  BELOW KNEE AMPUTATION ;  Surgeon: Angelia Mould, MD;  Location: [facility];  Service: Vascular;  Laterality: Left;  . PERIPHERAL VASCULAR CATHETERIZATION  08/14/2015   Procedure: Lower Extremity Intervention;  Surgeon: Katha Cabal, MD;  Location: [facility];  Service: Cardiovascular;;  . PERIPHERAL VASCULAR CATHETERIZATION N/A 08/14/2015   Procedure: Abdominal Aortogram w/Lower Extremity;  Surgeon: Katha Cabal, MD;  Location: [facility];  Service: Cardiovascular;  Laterality: N/A;    OB History    No data available       Home Medications    Prior to Admission medications   Medication Sig Start Date End Date Taking? Authorizing Provider  acetaminophen (TYLENOL) 500 MG tablet Take 500-1,000 mg by mouth every 6 (six) hours as needed for mild pain or headache.    Yes Historical Provider, MD  ALPRAZolam Duanne Moron) 0.5 MG tablet Take 0.25-0.5 mg by mouth 2 (two) times daily as needed for anxiety.   Yes Historical Provider, MD  amLODipine (NORVASC) 5 MG tablet Take 1 tablet (5 mg total) by mouth daily. 06/11/16  Yes Tonia Ghent, MD  dexamethasone (DECADRON) 4 MG tablet Take 20 mg by mouth every Monday.   Yes Historical Provider, MD  diclofenac sodium (VOLTAREN) 1 % GEL Apply 2 g topically 2 (two) times daily as needed (for shoulder pain). 06/11/16  Yes Tonia Ghent, MD  hydrochlorothiazide (MICROZIDE) 12.5 MG capsule Take 1 capsule (12.5 mg total) by mouth daily. 06/11/16  Yes Tonia Ghent, MD  mirtazapine (REMERON) 15 MG  tablet Take 7.5 mg by mouth at bedtime as needed (for sleep).    Yes Historical Provider, MD  pantoprazole (PROTONIX) 40 MG tablet Take 1 tablet (40 mg total) by mouth daily. 06/11/16  Yes Tonia Ghent, MD  prochlorperazine (COMPAZINE) 10 MG tablet Take 1 tablet (10 mg total) by mouth every 6 (six) hours as needed for nausea or vomiting. 08/11/16  Yes Curt Bears, MD  traMADol (ULTRAM) 50 MG tablet Take 25-50 mg by mouth every 6 (six) hours as needed for moderate pain.    Yes Historical Provider, MD  warfarin (COUMADIN) 5 MG tablet Take 1 tablet (5 mg total) by mouth as directed. Take 5 mg po Monday, Wednesday, Friday.  Take 2.5 mg po all other days. 08/20/16  Yes Velvet Bathe, MD    Family History Family History  Problem Relation Age of Onset  .  Arthritis Other     Parents  . Cancer Other     Colon, 1st degree relative <60  . Diabetes Other     Parents  . Stroke Other     1st degree relative <60  . Diabetes Mother   . Hypertension Mother   . Arthritis Mother   . Stroke Mother   . Diabetes Father   . Hypertension Father   . Arthritis Father   . Stroke Father   . Cancer Sister     stomach  . Stroke Brother   . Cancer Brother     prostate <50  . Stroke Daughter   . Stroke Son     Social History Social History  Substance Use Topics  . Smoking status: Former Smoker    Packs/day: 1.00    Years: 13.00    Quit date: 09/01/1973  . Smokeless tobacco: Never Used     Comment: QIUT MANY YEARS AGO  . Alcohol use No    Allergies   Patient has no known allergies.   Review of Systems Review of Systems  Unable to perform ROS: Mental status change    Physical Exam Updated Vital Signs BP 155/70   Pulse 65   Resp 14   SpO2 100%   Physical Exam  Constitutional: She appears well-developed. No distress.  Awake, alert, Cachectic, chronically ill-appearing  HENT:  Head: Normocephalic and atraumatic.  Mouth/Throat: Oropharynx is clear and moist. No oropharyngeal  exudate.  Eyes: Conjunctivae are normal. No scleral icterus.  Neck: Normal range of motion. Neck supple.  Cardiovascular: Normal rate, regular rhythm and intact distal pulses.   Murmur heard. Pulmonary/Chest: Effort normal and breath sounds normal. No respiratory distress. She has no wheezes.  Equal chest expansion  Abdominal: Soft. Bowel sounds are normal. She exhibits no mass. There is no tenderness. There is no rebound and no guarding.  Musculoskeletal: Normal range of motion. She exhibits no edema.  Left BKA FROM of the bilateral hands and elbows  Neurological: She is alert.  Speech is clear but answers are confused Moves extremities without ataxia  Skin: Skin is warm and dry. She is not diaphoretic. There is erythema.  Right forearm is erythematous, edematous and hot to touch.  Evidence of cannulation of veins in the hand recently.  Swelling extends from the midpoint of the dorsum of the right hand to just distal to the right elbow.  No induration or fluctuance  Psychiatric: She has a normal mood and affect.  Nursing note and vitals reviewed.    ED Treatments / Results  DIAGNOSTIC STUDIES: Oxygen Saturation is 100% on RA, normal by my interpretation.  COORDINATION OF CARE: 10:59 PM-Discussed treatment plan with pt at bedside and pt agreed to plan.   Labs (all labs ordered are listed, but only abnormal results are displayed) Labs Reviewed  COMPREHENSIVE METABOLIC PANEL - Abnormal; Notable for the following:       Result Value   Potassium 2.6 (*)    CO2 16 (*)    Glucose, Bld 134 (*)    BUN 66 (*)    Creatinine, Ser 3.47 (*)    Albumin 3.3 (*)    AST 382 (*)    Total Bilirubin <0.1 (*)    GFR calc non Af Amer 12 (*)    GFR calc Af Amer 14 (*)    Anion gap 17 (*)    All other components within normal limits  CBC WITH DIFFERENTIAL/PLATELET - Abnormal; Notable for the following:  WBC 3.0 (*)    RBC 5.15 (*)    RDW 16.4 (*)    Platelets 66 (*)    Lymphs Abs 0.2 (*)     All other components within normal limits  URINALYSIS, ROUTINE W REFLEX MICROSCOPIC - Abnormal; Notable for the following:    Color, Urine STRAW (*)    Glucose, UA 50 (*)    Hgb urine dipstick SMALL (*)    Protein, ur 30 (*)    Bacteria, UA RARE (*)    Squamous Epithelial / LPF 0-5 (*)    All other components within normal limits  PROTIME-INR - Abnormal; Notable for the following:    Prothrombin Time 18.4 (*)    All other components within normal limits  I-STAT CG4 LACTIC ACID, ED - Abnormal; Notable for the following:    Lactic Acid, Venous 2.61 (*)    All other components within normal limits  CBG MONITORING, ED - Abnormal; Notable for the following:    Glucose-Capillary 139 (*)    All other components within normal limits  CBG MONITORING, ED - Abnormal; Notable for the following:    Glucose-Capillary 38 (*)    All other components within normal limits  CBG MONITORING, ED - Abnormal; Notable for the following:    Glucose-Capillary 51 (*)    All other components within normal limits  CULTURE, BLOOD (ROUTINE X 2)  CULTURE, BLOOD (ROUTINE X 2)  URINE CULTURE  LIPASE, BLOOD  BRAIN NATRIURETIC PEPTIDE  CBG MONITORING, ED  I-STAT CG4 LACTIC ACID, ED  CBG MONITORING, ED  CBG MONITORING, ED   ED ECG REPORT   Date: 08/30/2016  Rate: 60  Rhythm: normal sinus rhythm  QRS Axis: normal  Intervals: normal  ST/T Wave abnormalities: normal  Conduction Disutrbances:none  Narrative Interpretation:   Old EKG Reviewed: unchanged  I have personally reviewed the EKG tracing and agree with the computerized printout as noted.   Radiology Dg Forearm Right  Result Date: 08/29/2016 CLINICAL DATA:  Acute onset of right forearm erythema and swelling. Initial encounter. EXAM: RIGHT FOREARM - 2 VIEW COMPARISON:  Right elbow radiographs performed 07/20/2014 FINDINGS: There is no evidence of fracture or dislocation. The radius and ulna appear grossly intact. The elbow joint is grossly  unremarkable. No elbow joint effusion is seen. Diffuse vascular calcifications are seen. The carpal rows appear grossly intact, and demonstrate normal alignment. IMPRESSION: No evidence of fracture or dislocation. Diffuse vascular calcifications seen. Electronically Signed   By: Roanna Raider M.D.   On: 08/29/2016 23:28   Ct Chest Wo Contrast  Result Date: 08/30/2016 CLINICAL DATA:  Follow-up RIGHT lung mass. History of multiple myeloma on chemotherapy. Unresponsive. EXAM: CT CHEST WITHOUT CONTRAST TECHNIQUE: Multidetector CT imaging of the chest was performed following the standard protocol without IV contrast. Creatinine 3.47. COMPARISON:  Chest radiograph August 29, 2016 and priors. FINDINGS: Moderate respiratory motion degraded examination. Cardiovascular: Heart size is mildly enlarged. Moderate coronary artery calcifications. Main pulmonary artery is enlarged, 3.4 cm compatible with chronic pulmonary hypertension. Thoracic aorta is normal in course and caliber with moderate calcific atherosclerosis. Mediastinum/Nodes: No medium spinal mass or lymphadenopathy though evaluation limited by lack of contrast. Lungs/Pleura: Patchy ground-glass consolidation RIGHT lower lobe. Trace pleural effusions. Interlobular septal thickening. Upper Abdomen: Peripherally calcified 16 mm probable mass RIGHT lobe of the liver, assessment limited due to patient motion. Peripancreatic fat stranding and small effusion about the tail of the pancreas. Musculoskeletal: Severe degenerative change of the RIGHT shoulder. Severe osteopenia. 5.3 x  2.3 cm expansile mass within the RIGHT lateral sixth rib with peripheral calcifications and pathologic fracture. Healing bilateral anterior rib fractures. Healing sternal fracture. Lytic lesions in the thoracic spine. Moderate to severe T5, mild T6, severe T7, moderate T9, mild T11, mild T12 and moderate L1 fractures. Retropulsed bony fragments T7 resulting in mild canal stenosis.  IMPRESSION: Moderate respiratory motion degraded examination. 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 Multiple healing anterior rib fractures, healing sternal fracture. Myelomatous involvement of the thoracic spine with multiple pathologic fractures. Patchy RIGHT lower lobe possible pneumonia. Interlobular septal thickening most compatible with pulmonary edema. Included pancreas demonstrates apparent acute of pancreatitis, recommend correlation with amylase and lipase. Electronically Signed   By: Elon Alas M.D.   On: 08/30/2016 02:41   Dg Chest Port 1 View  Result Date: 08/29/2016 CLINICAL DATA:  Acute onset of hypoglycemia. Altered mental status. Patient found unresponsive. Initial encounter. EXAM: PORTABLE CHEST 1 VIEW COMPARISON:  Chest radiograph performed 11/01/2015 FINDINGS: The lungs are hypoexpanded. A 5.3 cm peripheral masslike density is noted at the right lung. This may reflect focal pneumonia or possibly a mass. There is no evidence of pleural effusion or pneumothorax. The cardiomediastinal silhouette is borderline normal in size. No acute osseous abnormalities are seen. IMPRESSION: Lungs hypoexpanded. 5.3 cm peripheral masslike density at the right lung. This may reflect focal pneumonia or possibly mass. CT of the chest would be helpful for further evaluation, when and as deemed clinically appropriate. Electronically Signed   By: Garald Balding M.D.   On: 08/29/2016 23:33   Dg Hand Complete Right  Result Date: 08/29/2016 CLINICAL DATA:  Acute onset of altered mental status. Right hand swelling and erythema. Initial encounter. EXAM: RIGHT HAND - COMPLETE 3+ VIEW COMPARISON:  None. FINDINGS: There is mild deformity about the distal aspect of the fourth proximal phalanx, with joint space loss. This may be degenerative or posttraumatic in nature. Surrounding soft tissue swelling is noted. No acute  fracture or dislocation is seen. The carpal rows appear grossly intact. Diffuse vascular calcifications are seen. IMPRESSION: 1. No evidence of fracture or dislocation. Mild deformity about the distal aspect of the fourth proximal phalanx, possibly degenerative or posttraumatic in nature. 2. Diffuse vascular calcifications seen. Electronically Signed   By: Garald Balding M.D.   On: 08/29/2016 23:27    Procedures Procedures (including critical care time)  CRITICAL CARE Performed by: Abigail Butts Total critical care time: 45 minutes Critical care time was exclusive of separately billable procedures and treating other patients. Critical care was necessary to treat or prevent imminent or life-threatening deterioration. Critical care was time spent personally by me on the following activities: development of treatment plan with patient and/or surrogate as well as nursing, discussions with consultants, evaluation of patient's response to treatment, examination of patient, obtaining history from patient or surrogate, ordering and performing treatments and interventions, ordering and review of laboratory studies, ordering and review of radiographic studies, pulse oximetry and re-evaluation of patient's condition.   Medications Ordered in ED Medications  0.9 %  sodium chloride infusion (0 mLs Intravenous Stopped 08/30/16 0005)  dextrose 10 % infusion ( Intravenous New Bag/Given 08/30/16 0045)  vancomycin (VANCOCIN) 500 mg in sodium chloride 0.9 % 100 mL IVPB (not administered)  piperacillin-tazobactam (ZOSYN) IVPB 2.25 g (not administered)  potassium chloride 30 mEq in sodium chloride 0.9 % 265 mL (KCL MULTIRUN) IVPB (30 mEq Intravenous Given 08/30/16 0144)  sodium bicarbonate 150 mEq in sterile water 1,000 mL infusion ( Intravenous New Bag/Given 08/30/16 0152)  dexamethasone (DECADRON) tablet 20 mg (not administered)  sodium chloride 0.9 % bolus 1,000 mL (0 mLs Intravenous Stopped 08/30/16  0154)    And  sodium chloride 0.9 % bolus 500 mL (0 mLs Intravenous Stopped 08/30/16 0154)  piperacillin-tazobactam (ZOSYN) IVPB 3.375 g (0 g Intravenous Stopped 08/30/16 0015)  vancomycin (VANCOCIN) IVPB 1000 mg/200 mL premix (0 mg Intravenous Stopped 08/30/16 0155)  dextrose 50 % solution 12.5 g (12.5 g Intravenous Given 08/30/16 0032)  potassium chloride SA (K-DUR,KLOR-CON) CR tablet 40 mEq (40 mEq Oral Given 08/30/16 0143)     Initial Impression / Assessment and Plan / ED Course  I have reviewed the triage vital signs and the nursing notes.  Pertinent labs & imaging results that were available during my care of the patient were reviewed by me and considered in my medical decision making (see chart for details).  Clinical Course as of Aug 30 252  Fri Aug 29, 2016  2336 Family (daughter) at bedside with additional hx: Pt with hx of multiple myeloma.  R arm was used for chemo infusion on Tuesday and Wednesday.  Pt received 2 pints of blood on Wednesday for anemia  [HM]  2336 Pt hypothermic.  Warming initiated Temp: (!)(S) 96.4 F (35.8 C) [HM]  Sat Aug 30, 2016  0108 Discussed with Dr. Alcario Drought who will admit.    [HM]  0108 Hypokalemia Potassium: (!!) 2.6 [HM]  0108 Leukopenia WBC: (!) 3.0 [HM]  0108 Elevated Lactic Acid, Venous: (!!) 2.61 [HM]  0108 No UTI Leukocytes, UA: NEGATIVE [HM]  0108 Slowly worsening. Creatinine: (!) 3.47 [HM]    Clinical Course User Index [HM] Traven Davids, PA-C    Pt with History of multiple myeloma now with hypoglycemia. Patient on long-acting hyperglycemic. Family reports she's not eating well. Blood sugar continues to drop. D50 and D10 given here in the emergency department. Hypokalemic with elevated lactic acid. Slowly worsening creatinine.  Question pneumonia versus mass on the chest x-ray. CT scan shows mass with potential pneumonia versus pulmonary edema. Patient also with cellulitis of the right arm. Admitted for further management.  The  patient was discussed with and seen by Dr. Laneta Simmers who agrees with the treatment plan.   Final Clinical Impressions(s) / ED Diagnoses   Final diagnoses:  Hypoglycemia  Confusion  Weakness  Altered mental status, unspecified altered mental status type  Leukopenia, unspecified type  Sepsis, due to unspecified organism [facility])    New Prescriptions New Prescriptions   No medications on file    I personally performed the services described in this documentation, which was scribed in my presence. The recorded information has been reviewed and is accurate.    Jarrett Soho Dimitri Shakespeare, PA-C 08/30/16 1610    Leo Grosser, MD 08/30/16 [phone]

## 2016-08-29 NOTE — ED Notes (Signed)
Bed: WA09 Expected date:  Expected time:  Means of arrival:  Comments: CBG 38 unresponsive now CBG 258

## 2016-08-29 NOTE — Telephone Encounter (Signed)
I left message with [name] about getting a port for her mother. I asked her to call me back.

## 2016-08-29 NOTE — Telephone Encounter (Signed)
If the patient is agreeable to hospice eval, then I would get that referral done first.   I would only do that if patient agreed.   If patient didn't want to go that route, then we should check with home health agency.  Let me know.   Thanks.

## 2016-08-29 NOTE — Telephone Encounter (Signed)
Spoke with [name] (daughter) who says that patient is willing to get a Hospice referral, but when she spoke with a Hospice representative, she was told that they could not take the patient if she was still taking chemo and she takes it on Tues and Thurs.    So if that is the case, daughter says they will try to select a [facility] agency from the list that her insurance gave them and will be in contact with [place] on Tuesday with that info.

## 2016-08-29 NOTE — Telephone Encounter (Signed)
I left message with Tiffany -How many days does pt need to be off coumadin for port ? I will then ask [facility].

## 2016-08-29 NOTE — Telephone Encounter (Signed)
Daughter, [name], called asking about getting a rx for home health care. She only spoke to her insurance company to find out what the coverage is for. They will cover [place]. Her daughter was also mentioning Hospice. She has called Hospice and said they told her to have her PCP do a referral if he felt she was hospice eligible. She said she is declining rapidly and it is becoming too much on the family to care for her.

## 2016-08-29 NOTE — ED Triage Notes (Signed)
Brought in by EMS from home with c/o hypoglycemia with altered mental status.  Pt's family reported that pt was found unresponsive when she was checked an hour after she went to bed.  Pt was diaphoretic, cold and clammy on EMS' arrival; pt's CBG was 32.  Pt was given D10 IV at the scene which bumped her CBG up to 238.  Pt was still "confused"; per family, pt normally returns to "normal" as soon as sugar is given.

## 2016-08-29 NOTE — ED Notes (Signed)
Pt. I-stat CG4 Lactic acid results 2.61. EDP,Oni,MD., made aware.

## 2016-08-29 NOTE — Telephone Encounter (Signed)
Daughter is in agreement with port.

## 2016-08-30 ENCOUNTER — Inpatient Hospital Stay (HOSPITAL_COMMUNITY): Payer: Medicare Other

## 2016-08-30 ENCOUNTER — Emergency Department (HOSPITAL_COMMUNITY): Payer: Medicare Other

## 2016-08-30 DIAGNOSIS — E876 Hypokalemia: Secondary | ICD-10-CM | POA: Diagnosis present

## 2016-08-30 DIAGNOSIS — Z7901 Long term (current) use of anticoagulants: Secondary | ICD-10-CM | POA: Diagnosis not present

## 2016-08-30 DIAGNOSIS — T383X1A Poisoning by insulin and oral hypoglycemic [antidiabetic] drugs, accidental (unintentional), initial encounter: Secondary | ICD-10-CM | POA: Diagnosis not present

## 2016-08-30 DIAGNOSIS — C9002 Multiple myeloma in relapse: Secondary | ICD-10-CM | POA: Diagnosis not present

## 2016-08-30 DIAGNOSIS — N183 Chronic kidney disease, stage 3 unspecified: Secondary | ICD-10-CM | POA: Diagnosis present

## 2016-08-30 DIAGNOSIS — L03113 Cellulitis of right upper limb: Secondary | ICD-10-CM | POA: Diagnosis not present

## 2016-08-30 DIAGNOSIS — Z86718 Personal history of other venous thrombosis and embolism: Secondary | ICD-10-CM | POA: Diagnosis not present

## 2016-08-30 DIAGNOSIS — F0391 Unspecified dementia with behavioral disturbance: Secondary | ICD-10-CM | POA: Diagnosis not present

## 2016-08-30 DIAGNOSIS — J189 Pneumonia, unspecified organism: Secondary | ICD-10-CM | POA: Diagnosis present

## 2016-08-30 DIAGNOSIS — Z923 Personal history of irradiation: Secondary | ICD-10-CM | POA: Diagnosis not present

## 2016-08-30 DIAGNOSIS — E43 Unspecified severe protein-calorie malnutrition: Secondary | ICD-10-CM | POA: Diagnosis present

## 2016-08-30 DIAGNOSIS — E161 Other hypoglycemia: Secondary | ICD-10-CM | POA: Diagnosis not present

## 2016-08-30 DIAGNOSIS — R609 Edema, unspecified: Secondary | ICD-10-CM

## 2016-08-30 DIAGNOSIS — I429 Cardiomyopathy, unspecified: Secondary | ICD-10-CM | POA: Diagnosis present

## 2016-08-30 DIAGNOSIS — G9340 Encephalopathy, unspecified: Secondary | ICD-10-CM | POA: Diagnosis present

## 2016-08-30 DIAGNOSIS — E119 Type 2 diabetes mellitus without complications: Secondary | ICD-10-CM

## 2016-08-30 DIAGNOSIS — R531 Weakness: Secondary | ICD-10-CM | POA: Diagnosis not present

## 2016-08-30 DIAGNOSIS — E162 Hypoglycemia, unspecified: Secondary | ICD-10-CM | POA: Diagnosis present

## 2016-08-30 DIAGNOSIS — E11649 Type 2 diabetes mellitus with hypoglycemia without coma: Secondary | ICD-10-CM | POA: Diagnosis present

## 2016-08-30 DIAGNOSIS — Z515 Encounter for palliative care: Secondary | ICD-10-CM | POA: Diagnosis not present

## 2016-08-30 DIAGNOSIS — T383X1D Poisoning by insulin and oral hypoglycemic [antidiabetic] drugs, accidental (unintentional), subsequent encounter: Secondary | ICD-10-CM

## 2016-08-30 DIAGNOSIS — E872 Acidosis, unspecified: Secondary | ICD-10-CM | POA: Diagnosis present

## 2016-08-30 DIAGNOSIS — R410 Disorientation, unspecified: Secondary | ICD-10-CM | POA: Diagnosis not present

## 2016-08-30 DIAGNOSIS — E160 Drug-induced hypoglycemia without coma: Secondary | ICD-10-CM

## 2016-08-30 DIAGNOSIS — Z66 Do not resuscitate: Secondary | ICD-10-CM | POA: Diagnosis not present

## 2016-08-30 DIAGNOSIS — F05 Delirium due to known physiological condition: Secondary | ICD-10-CM | POA: Diagnosis present

## 2016-08-30 DIAGNOSIS — N179 Acute kidney failure, unspecified: Secondary | ICD-10-CM | POA: Diagnosis not present

## 2016-08-30 DIAGNOSIS — N08 Glomerular disorders in diseases classified elsewhere: Secondary | ICD-10-CM

## 2016-08-30 DIAGNOSIS — D696 Thrombocytopenia, unspecified: Secondary | ICD-10-CM | POA: Diagnosis present

## 2016-08-30 DIAGNOSIS — E1122 Type 2 diabetes mellitus with diabetic chronic kidney disease: Secondary | ICD-10-CM | POA: Diagnosis present

## 2016-08-30 DIAGNOSIS — C801 Malignant (primary) neoplasm, unspecified: Secondary | ICD-10-CM | POA: Diagnosis not present

## 2016-08-30 DIAGNOSIS — C9000 Multiple myeloma not having achieved remission: Secondary | ICD-10-CM

## 2016-08-30 DIAGNOSIS — N2889 Other specified disorders of kidney and ureter: Secondary | ICD-10-CM

## 2016-08-30 DIAGNOSIS — E2740 Unspecified adrenocortical insufficiency: Secondary | ICD-10-CM | POA: Diagnosis present

## 2016-08-30 DIAGNOSIS — R918 Other nonspecific abnormal finding of lung field: Secondary | ICD-10-CM | POA: Diagnosis not present

## 2016-08-30 DIAGNOSIS — K219 Gastro-esophageal reflux disease without esophagitis: Secondary | ICD-10-CM | POA: Diagnosis present

## 2016-08-30 DIAGNOSIS — R222 Localized swelling, mass and lump, trunk: Secondary | ICD-10-CM

## 2016-08-30 DIAGNOSIS — I10 Essential (primary) hypertension: Secondary | ICD-10-CM

## 2016-08-30 DIAGNOSIS — Z681 Body mass index (BMI) 19 or less, adult: Secondary | ICD-10-CM | POA: Diagnosis not present

## 2016-08-30 DIAGNOSIS — Z7189 Other specified counseling: Secondary | ICD-10-CM | POA: Diagnosis not present

## 2016-08-30 DIAGNOSIS — G9341 Metabolic encephalopathy: Secondary | ICD-10-CM | POA: Diagnosis present

## 2016-08-30 LAB — BASIC METABOLIC PANEL
ANION GAP: 14 (ref 5–15)
Anion gap: 10 (ref 5–15)
Anion gap: 13 (ref 5–15)
BUN: 59 mg/dL — ABNORMAL HIGH (ref 6–20)
BUN: 60 mg/dL — ABNORMAL HIGH (ref 6–20)
BUN: 55 mg/dL — ABNORMAL HIGH (ref 6–20)
CALCIUM: 7.9 mg/dL — AB (ref 8.9–10.3)
CALCIUM: 8.1 mg/dL — AB (ref 8.9–10.3)
CALCIUM: 8 mg/dL — AB (ref 8.9–10.3)
CO2: 17 mmol/L — ABNORMAL LOW (ref 22–32)
CO2: 19 mmol/L — ABNORMAL LOW (ref 22–32)
CO2: 20 mmol/L — ABNORMAL LOW (ref 22–32)
CREATININE: 2.88 mg/dL — AB (ref 0.44–1.00)
CREATININE: 3.05 mg/dL — AB (ref 0.44–1.00)
CREATININE: 2.98 mg/dL — AB (ref 0.44–1.00)
Chloride: 109 mmol/L (ref 101–111)
Chloride: 111 mmol/L (ref 101–111)
Chloride: 106 mmol/L (ref 101–111)
GFR calc non Af Amer: 15 mL/min — ABNORMAL LOW (ref >60)
GFR, EST AFRICAN AMERICAN: 17 mL/min — AB (ref >60)
GFR, EST AFRICAN AMERICAN: 16 mL/min — AB (ref >60)
GFR, EST AFRICAN AMERICAN: 16 mL/min — AB (ref >60)
GFR, EST NON AFRICAN AMERICAN: 14 mL/min — AB (ref >60)
GFR, EST NON AFRICAN AMERICAN: 14 mL/min — AB (ref >60)
Glucose, Bld: 49 mg/dL — ABNORMAL LOW (ref 65–99)
Glucose, Bld: 72 mg/dL (ref 65–99)
Glucose, Bld: 129 mg/dL — ABNORMAL HIGH (ref 65–99)
Potassium: 4.2 mmol/L (ref 3.5–5.1)
Potassium: 4 mmol/L (ref 3.5–5.1)
Potassium: 3.4 mmol/L — ABNORMAL LOW (ref 3.5–5.1)
SODIUM: 140 mmol/L (ref 135–145)
SODIUM: 140 mmol/L (ref 135–145)
SODIUM: 139 mmol/L (ref 135–145)

## 2016-08-30 LAB — GLUCOSE, CAPILLARY
GLUCOSE-CAPILLARY: 65 mg/dL (ref 65–99)
GLUCOSE-CAPILLARY: 134 mg/dL — AB (ref 65–99)
GLUCOSE-CAPILLARY: 91 mg/dL (ref 65–99)
GLUCOSE-CAPILLARY: 77 mg/dL (ref 65–99)
GLUCOSE-CAPILLARY: 134 mg/dL — AB (ref 65–99)
Glucose-Capillary: 123 mg/dL — ABNORMAL HIGH (ref 65–99)
Glucose-Capillary: 101 mg/dL — ABNORMAL HIGH (ref 65–99)
Glucose-Capillary: 213 mg/dL — ABNORMAL HIGH (ref 65–99)
Glucose-Capillary: 130 mg/dL — ABNORMAL HIGH (ref 65–99)
Glucose-Capillary: 225 mg/dL — ABNORMAL HIGH (ref 65–99)
Glucose-Capillary: 159 mg/dL — ABNORMAL HIGH (ref 65–99)

## 2016-08-30 LAB — COMPREHENSIVE METABOLIC PANEL
ALK PHOS: 77 U/L (ref 38–126)
ALT: 29 U/L (ref 14–54)
ANION GAP: 17 — AB (ref 5–15)
AST: 382 U/L — ABNORMAL HIGH (ref 15–41)
Albumin: 3.3 g/dL — ABNORMAL LOW (ref 3.5–5.0)
BUN: 66 mg/dL — ABNORMAL HIGH (ref 6–20)
CALCIUM: 9.2 mg/dL (ref 8.9–10.3)
CO2: 16 mmol/L — ABNORMAL LOW (ref 22–32)
Chloride: 102 mmol/L (ref 101–111)
Creatinine, Ser: 3.47 mg/dL — ABNORMAL HIGH (ref 0.44–1.00)
GFR calc non Af Amer: 12 mL/min — ABNORMAL LOW (ref >60)
GFR, EST AFRICAN AMERICAN: 14 mL/min — AB (ref >60)
Glucose, Bld: 134 mg/dL — ABNORMAL HIGH (ref 65–99)
POTASSIUM: 2.6 mmol/L — AB (ref 3.5–5.1)
Sodium: 135 mmol/L (ref 135–145)
TOTAL PROTEIN: 6.5 g/dL (ref 6.5–8.1)

## 2016-08-30 LAB — URINALYSIS, ROUTINE W REFLEX MICROSCOPIC
Bilirubin Urine: NEGATIVE
GLUCOSE, UA: 50 mg/dL — AB
KETONES UR: NEGATIVE mg/dL
LEUKOCYTES UA: NEGATIVE
Nitrite: NEGATIVE
PH: 6 (ref 5.0–8.0)
Protein, ur: 30 mg/dL — AB
Specific Gravity, Urine: 1.011 (ref 1.005–1.030)

## 2016-08-30 LAB — CBC WITH DIFFERENTIAL/PLATELET
BASOS ABS: 0 10*3/uL (ref 0.0–0.1)
Basophils Relative: 0 %
EOS ABS: 0 10*3/uL (ref 0.0–0.7)
Eosinophils Relative: 0 %
HCT: 42.6 % (ref 36.0–46.0)
Hemoglobin: 15 g/dL (ref 12.0–15.0)
LYMPHS PCT: 8 %
Lymphs Abs: 0.2 10*3/uL — ABNORMAL LOW (ref 0.7–4.0)
MCH: 29.1 pg (ref 26.0–34.0)
MCHC: 35.2 g/dL (ref 30.0–36.0)
MCV: 82.7 fL (ref 78.0–100.0)
Monocytes Absolute: 0.2 10*3/uL (ref 0.1–1.0)
Monocytes Relative: 5 %
NEUTROS ABS: 2.6 10*3/uL (ref 1.7–7.7)
NEUTROS PCT: 87 %
PLATELETS: 66 10*3/uL — AB (ref 150–400)
RBC: 5.15 MIL/uL — AB (ref 3.87–5.11)
RDW: 16.4 % — AB (ref 11.5–15.5)
WBC: 3 10*3/uL — AB (ref 4.0–10.5)

## 2016-08-30 LAB — BRAIN NATRIURETIC PEPTIDE: B Natriuretic Peptide: 193.7 pg/mL — ABNORMAL HIGH (ref 0.0–100.0)

## 2016-08-30 LAB — I-STAT CG4 LACTIC ACID, ED: Lactic Acid, Venous: 1.7 mmol/L (ref 0.5–1.9)

## 2016-08-30 LAB — PROTIME-INR
INR: 1.51
PROTHROMBIN TIME: 18.4 s — AB (ref 11.4–15.2)

## 2016-08-30 LAB — CBC
HCT: 33 % — ABNORMAL LOW (ref 36.0–46.0)
Hemoglobin: 11.6 g/dL — ABNORMAL LOW (ref 12.0–15.0)
MCH: 28.2 pg (ref 26.0–34.0)
MCHC: 35.2 g/dL (ref 30.0–36.0)
MCV: 80.3 fL (ref 78.0–100.0)
PLATELETS: 69 10*3/uL — AB (ref 150–400)
RBC: 4.11 MIL/uL (ref 3.87–5.11)
RDW: 16 % — ABNORMAL HIGH (ref 11.5–15.5)
WBC: 2.7 10*3/uL — AB (ref 4.0–10.5)

## 2016-08-30 LAB — CBG MONITORING, ED
GLUCOSE-CAPILLARY: 51 mg/dL — AB (ref 65–99)
GLUCOSE-CAPILLARY: 82 mg/dL (ref 65–99)
Glucose-Capillary: 38 mg/dL — CL (ref 65–99)
Glucose-Capillary: 45 mg/dL — ABNORMAL LOW (ref 65–99)

## 2016-08-30 LAB — MRSA PCR SCREENING: MRSA by PCR: NEGATIVE

## 2016-08-30 LAB — LIPASE, BLOOD: LIPASE: 95 U/L — AB (ref 11–51)

## 2016-08-30 LAB — MAGNESIUM: Magnesium: 1.3 mg/dL — ABNORMAL LOW (ref 1.7–2.4)

## 2016-08-30 LAB — PROCALCITONIN: Procalcitonin: 1.04 ng/mL

## 2016-08-30 MED ORDER — ALPRAZOLAM 0.25 MG PO TABS
0.2500 mg | ORAL_TABLET | Freq: Two times a day (BID) | ORAL | Status: DC | PRN
  Administered 2016-08-30 – 2016-09-02 (×2): 0.25 mg via ORAL
  Filled 2016-08-30: qty 1
  Filled 2016-08-30: qty 2
  Filled 2016-08-30 (×2): qty 1

## 2016-08-30 MED ORDER — WARFARIN - PHARMACIST DOSING INPATIENT: Freq: Every day | Status: DC

## 2016-08-30 MED ORDER — PANTOPRAZOLE SODIUM 40 MG PO TBEC
40.0000 mg | DELAYED_RELEASE_TABLET | Freq: Every day | ORAL | Status: DC
  Administered 2016-08-30 – 2016-09-04 (×5): 40 mg via ORAL
  Filled 2016-08-30 (×6): qty 1

## 2016-08-30 MED ORDER — SODIUM CHLORIDE 0.9 % IV SOLN
30.0000 meq | Freq: Once | INTRAVENOUS | Status: DC
  Administered 2016-08-30: 30 meq via INTRAVENOUS
  Filled 2016-08-30: qty 15

## 2016-08-30 MED ORDER — DEXTROSE 10 % IV SOLN
INTRAVENOUS | Status: DC
  Administered 2016-08-30: 01:00:00 via INTRAVENOUS
  Filled 2016-08-30: qty 1000

## 2016-08-30 MED ORDER — DEXTROSE 50 % IV SOLN
12.5000 g | Freq: Once | INTRAVENOUS | Status: AC
  Administered 2016-08-30: 12.5 g via INTRAVENOUS
  Filled 2016-08-30: qty 50

## 2016-08-30 MED ORDER — WARFARIN SODIUM 2.5 MG PO TABS
2.5000 mg | ORAL_TABLET | Freq: Once | ORAL | Status: AC
  Administered 2016-08-30: 2.5 mg via ORAL
  Filled 2016-08-30 (×2): qty 1

## 2016-08-30 MED ORDER — MIRTAZAPINE 15 MG PO TABS
7.5000 mg | ORAL_TABLET | Freq: Every evening | ORAL | Status: DC | PRN
  Filled 2016-08-30: qty 1

## 2016-08-30 MED ORDER — PIPERACILLIN-TAZOBACTAM IN DEX 2-0.25 GM/50ML IV SOLN
2.2500 g | Freq: Three times a day (TID) | INTRAVENOUS | Status: DC
  Filled 2016-08-30: qty 50

## 2016-08-30 MED ORDER — MAGNESIUM SULFATE 2 GM/50ML IV SOLN
2.0000 g | Freq: Once | INTRAVENOUS | Status: AC
  Administered 2016-08-30: 2 g via INTRAVENOUS
  Filled 2016-08-30: qty 50

## 2016-08-30 MED ORDER — TRAMADOL HCL 50 MG PO TABS
25.0000 mg | ORAL_TABLET | Freq: Four times a day (QID) | ORAL | Status: DC | PRN
  Administered 2016-08-30 – 2016-09-01 (×5): 50 mg via ORAL
  Filled 2016-08-30 (×5): qty 1

## 2016-08-30 MED ORDER — STERILE WATER FOR INJECTION IV SOLN
INTRAVENOUS | Status: DC
  Administered 2016-08-30 – 2016-09-02 (×6): via INTRAVENOUS
  Filled 2016-08-30 (×11): qty 850

## 2016-08-30 MED ORDER — PROCHLORPERAZINE MALEATE 10 MG PO TABS
10.0000 mg | ORAL_TABLET | Freq: Four times a day (QID) | ORAL | Status: DC | PRN
  Filled 2016-08-30: qty 1

## 2016-08-30 MED ORDER — DEXTROSE 50 % IV SOLN
INTRAVENOUS | Status: AC
  Administered 2016-08-30: 25 mL
  Filled 2016-08-30: qty 50

## 2016-08-30 MED ORDER — VANCOMYCIN HCL 500 MG IV SOLR: 500.0000 mg | INTRAVENOUS | Status: DC

## 2016-08-30 MED ORDER — POTASSIUM CHLORIDE CRYS ER 20 MEQ PO TBCR
40.0000 meq | EXTENDED_RELEASE_TABLET | Freq: Once | ORAL | Status: AC
  Administered 2016-08-30: 40 meq via ORAL
  Filled 2016-08-30: qty 2

## 2016-08-30 MED ORDER — ACETAMINOPHEN 500 MG PO TABS
500.0000 mg | ORAL_TABLET | Freq: Four times a day (QID) | ORAL | Status: DC | PRN
  Administered 2016-08-30 – 2016-09-02 (×3): 1000 mg via ORAL
  Filled 2016-08-30 (×3): qty 2

## 2016-08-30 MED ORDER — DICLOFENAC SODIUM 1 % TD GEL
2.0000 g | Freq: Two times a day (BID) | TRANSDERMAL | Status: DC | PRN
  Filled 2016-08-30: qty 100

## 2016-08-30 MED ORDER — AMLODIPINE BESYLATE 5 MG PO TABS
5.0000 mg | ORAL_TABLET | Freq: Every day | ORAL | Status: DC
  Administered 2016-08-30 – 2016-09-04 (×5): 5 mg via ORAL
  Filled 2016-08-30 (×6): qty 1

## 2016-08-30 MED ORDER — DEXAMETHASONE 4 MG PO TABS
20.0000 mg | ORAL_TABLET | ORAL | Status: DC
  Administered 2016-09-01: 20 mg via ORAL
  Filled 2016-08-30 (×2): qty 5

## 2016-08-30 NOTE — Progress Notes (Signed)
ANTICOAGULATION CONSULT NOTE - Initial Consult  Pharmacy Consult for Warfarin Indication: DVT  No Known Allergies  Patient Measurements:    Vital Signs: Temp: 96.4 F (35.8 C) (12/29 2326) Temp Source: Rectal (12/29 2326) BP: 169/88 (12/30 0310) Pulse Rate: 78 (12/30 0310)  Labs:  Recent Labs  08/29/16 2313 08/29/16 2357  HGB 15.0  --   HCT 42.6  --   PLT 66*  --   LABPROT  --  18.4*  INR  --  1.51  CREATININE 3.47*  --     Estimated Creatinine Clearance: 9.6 mL/min (by C-G formula based on SCr of 3.47 mg/dL (H)).   Medical History: Past Medical History:  Diagnosis Date  . Anemia   . Anxiety    takes Xanax daily as needed  . Blood transfusion   . Cardiomyopathy ([place])   . Deep vein thrombosis of bilateral lower extremities (HCC)   . Degenerative joint disease   . Depression    Mild  . Diabetes mellitus    takes Glipizide daily  . Family history of anesthesia complication    DAUGHTER CPR AFTER  . GERD (gastroesophageal reflux disease)    takes Protonix daily  . Heart murmur   . History of chicken pox   . History of radiation therapy 08/30/13-09/20/13   20 gray to lower lumbar/upper sacrum  . History of shingles   . Hyperlipidemia   . Hypertension    takes Amlodipine,HCTZ,and Lisinopril daily  . Multiple myeloma    per Dr. Julien Nordmann, s/p palliative radiation for leg pain 2011 per Dr. Sondra Come  . Phlebitis   . UTI (urinary tract infection) 09/08/2013    Assessment:  80 yr female known to pharmacy from vancomycin and zosyn dosing for sepsis.  PTA patient on warfarin for h/o bilateral lower DVT's  PTA warfarin regimen = 2.28m daily except 543mon MWF  Last dose taken on 12/26 when INR = 4.3 and pt instructed by PCP to hold warfarin  INR (12/29) = 1.51  Upon admission, pharmacy consulted to dose warfarin  Goal of Therapy:  INR 2-3   Plan:  Warfarin 2.14m414mo x 1 this AM Check daily INR  Brigham Cobbins, LeaToribio HarbourharmD 08/30/2016,3:16 AM

## 2016-08-30 NOTE — Progress Notes (Signed)
Pharmacy Antibiotic Note  [name] is a 80 y.o. female admitted on 08/29/2016 with sepsis.  Presented to ED with hypoglycemia and altered mental status.  Lactic acid was elevated.  PMH significant for multiple myeloma, undergoing chemotherapy. In the ED, patient received Vancomycin 1gm and Zosyn 3.375gm IV x 1 dose each.  Pharmacy has been consulted for Vancomycin and Zosyn dosing.  Plan:  Zosyn 2.25gm IV q8h  Vancomycin '500mg'$  IV q48h  Vancomycin trough goal: 15-20 mcg/ml  F/u cultures, watch renal function, check vanc level when appropriate    Temp (24hrs), Avg:96.4 F (35.8 C), Min:96.4 F (35.8 C), Max:96.4 F (35.8 C)   Recent Labs Lab 08/26/16 1131 08/26/16 1137 08/29/16 2313 08/29/16 2314  WBC 7.4  --  3.0*  --   CREATININE  --  3.4* 3.47*  --   LATICACIDVEN  --   --   --  2.61*    Estimated Creatinine Clearance: 9.6 mL/min (by C-G formula based on SCr of 3.47 mg/dL (H)).    No Known Allergies  Antimicrobials this admission: 12/29 zosyn >>   12/30 vanc >>    Dose adjustments this admission:   Microbiology results: 12/29 BCx: sent 12/29 UCx: sent   Thank you for allowing pharmacy to be a part of this patient's care.  Everette Rank, PharmD 08/30/2016 12:45 AM

## 2016-08-30 NOTE — Progress Notes (Signed)
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  Patient found to be persistently hypoglycemic. Following several amps of D50 and then continue D10 fluids, CBGs started to stabilize. Patient began to wake up and become more alert. She was also noted on admission to have left upper extremity swelling, induration, tenderness and mild erythema in her forearm. Her INR was noted to be subtherapeutic. This was thought to possibly be cellulitis versus venous thrombosis and patient started on antibiotics.  Later this morning, patient seen in ICU. More awake. According to daughters, she is more herself although not fully yet back to baseline. Patient's biggest complaint is of right forearm pain  Assessment/Plan: Principal Problem:   Hypoglycemia causing acute metabolic encephalopathy secondary to sulfonylurea: Secondary to decreased clearance brought on by worsening renal failure. Medication discontinued. Patient's CBGs are starting to stabilize and had discontinued her D10 fluids. We'll cover with sliding scale only for now. Encephalopathy improving Active Problems:   Multiple myeloma ([place]): Oncology notified of admission.    Essential hypertension: Continue home medications   Hypokalemia: Replacing.   Chronic anticoagulation: INR subtherapeutic. Ruling out upper extremity thrombus. Have resumed Coumadin.   Metabolic acidosis, normal anion gap (NAG)   Cellulitis of right forearm/swelling: Checking ultrasound of right upper extremity. Continue antibiotics   Myeloma kidney (HCC)/chronic kidney disease, stage III: Continue to monitor. Hydrating.   Hypomagnesemia:  Replacing. Pathologic rib fracture/ mass:? Plasmacytoma. We'll discuss with oncology ? Pneumonia: Family reports an increased cough. We'll go ahead and check pro-calcitonin level. Lactic acidosis: On admission. Do not think this is sepsis, elevated level secondary to dehydration  Code Status: Full code   Family Communication: Multiple daughters at the bedside   Disposition Plan: Improving. Transfer to floor. Rib metastases may determine long-term outcomes and change treatment plans    Consultants:  Oncology notified of admission   Procedures:  Right upper extremity ultrasound ordered   Antimicrobials:  IV Zosyn and vancomycin 12/29-present   DVT prophylaxis: Coumadin per pharmacy   Objective: Vitals:   08/30/16 0900 08/30/16 1000 08/30/16 1100 08/30/16 1200  BP:  (!) 145/65  (!) 157/66  Pulse: 78 77 69 71  Resp: '20 20 17 15  '$ Temp:      TempSrc:      SpO2: 99% 99% 98% 100%  Weight:      Height:        Intake/Output Summary (Last 24 hours) at 08/30/16 1306 Last data filed at 08/30/16 1200  Gross per 24 hour  Intake          4020.83 ml  Output                0 ml  Net          4020.83 ml   Filed Weights   08/30/16 0800  Weight: 43.7 kg (96 lb 5.5 oz)    Exam:   General:  Alert and oriented 2, no acute distress   Cardiovascular: Regular rate and rhythm, S1-S2   Respiratory: Clear to auscultation bilaterally   Abdomen: Soft, nontender, nondistended, positive bowel sounds   Musculoskeletal: Right upper extremity tender, indurated, swollen   Skin: As above, otherwise no skin breaks, tears  or lesions  Psychiatry: Patient seems appropriate, no evidence of psychoses. Patient still not 100%    Data Reviewed: CBC:  Recent Labs Lab 08/26/16 1131 08/29/16 2313 08/30/16 0315  WBC 7.4 3.0* 2.7*  NEUTROABS 5.4 2.6  --   HGB 6.5* 15.0 11.6*  HCT 19.4* 42.6 33.0*  MCV 82.1 82.7 80.3  PLT 175 66* 69*   Basic Metabolic Panel:  Recent Labs Lab  08/26/16 1137 08/29/16 2313 08/30/16 0302 08/30/16 0641 08/30/16 1120  NA 148* 135 140 140 139  K 3.5 2.6* 4.2 4.0 3.4*  CL  --  102 109 111 106  CO2 21* 16* 17* 19* 20*  GLUCOSE 91 134* 49* 72 129*  BUN 52.1* 66* 59* 60* 55*  CREATININE 3.4* 3.47* 2.88* 3.05* 2.98*  CALCIUM 13.4* 9.2 7.9* 8.1* 8.0*  MG  --   --  1.3*  --   --    GFR: Estimated Creatinine Clearance: 10.7 mL/min (by C-G formula based on SCr of 2.98 mg/dL (H)). Liver Function Tests:  Recent Labs Lab 08/26/16 1137 08/29/16 2313  AST 144* 382*  ALT 16 29  ALKPHOS 101 77  BILITOT 0.25 <0.1*  PROT 7.2 6.5  ALBUMIN 3.4* 3.3*    Recent Labs Lab 08/29/16 2313  LIPASE 95*   No results for input(s): AMMONIA in the last 168 hours. Coagulation Profile:  Recent Labs Lab 08/26/16 1131 08/29/16 2357  INR 4.30* 1.51  PROTIME 51.6*  --    Cardiac Enzymes: No results for input(s): CKTOTAL, CKMB, CKMBINDEX, TROPONINI in the last 168 hours. BNP (last 3 results) No results for input(s): PROBNP in the last 8760 hours. HbA1C: No results for input(s): HGBA1C in the last 72 hours. CBG:  Recent Labs Lab 08/30/16 0759 08/30/16 0857 08/30/16 0957 08/30/16 1054 08/30/16 1209  GLUCAP 134* 91 77 130* 134*   Lipid Profile: No results for input(s): CHOL, HDL, LDLCALC, TRIG, CHOLHDL, LDLDIRECT in the last 72 hours. Thyroid Function Tests: No results for input(s): TSH, T4TOTAL, FREET4, T3FREE, THYROIDAB in the last 72 hours. Anemia Panel: No results for input(s): VITAMINB12, FOLATE, FERRITIN, TIBC, IRON, RETICCTPCT in the last 72 hours. Urine analysis:    Component Value Date/Time   COLORURINE STRAW (A) 08/29/2016 2335   APPEARANCEUR CLEAR 08/29/2016 2335   LABSPEC 1.011 08/29/2016 2335   LABSPEC 1.020 10/19/2013 1546   PHURINE 6.0 08/29/2016 2335   GLUCOSEU 50 (A) 08/29/2016 2335   GLUCOSEU 1,000 10/19/2013 1546   HGBUR SMALL (A) 08/29/2016 2335   BILIRUBINUR NEGATIVE 08/29/2016 2335   BILIRUBINUR  negative 05/18/2015 1545   BILIRUBINUR Negative 10/19/2013 [place] 08/29/2016 2335   PROTEINUR 30 (A) 08/29/2016 2335   UROBILINOGEN negative 05/18/2015 1545   UROBILINOGEN 0.2 03/10/2014 0007   UROBILINOGEN 0.2 10/19/2013 1546   NITRITE NEGATIVE 08/29/2016 2335   LEUKOCYTESUR NEGATIVE 08/29/2016 2335   LEUKOCYTESUR Negative 10/19/2013 1546   Sepsis Labs: '@LABRCNTIP'$ (procalcitonin:4,lacticidven:4)  ) Recent Results (from the past 240 hour(s))  MRSA PCR Screening     Status: None   Collection Time: 08/30/16  4:08 AM  Result Value Ref Range Status   MRSA by PCR NEGATIVE NEGATIVE Final    Comment:        The GeneXpert MRSA Assay (FDA approved for NASAL specimens only), is one component of a comprehensive MRSA colonization surveillance program. It is not intended to diagnose MRSA infection nor to guide or monitor treatment for MRSA infections.       Studies: Dg Forearm Right  Result Date: 08/29/2016 CLINICAL DATA:  Acute onset of right forearm erythema and swelling. Initial encounter. EXAM: RIGHT FOREARM - 2 VIEW COMPARISON:  Right elbow radiographs performed 07/20/2014 FINDINGS: There is no evidence of fracture or dislocation. The radius and ulna appear grossly intact. The elbow joint is grossly unremarkable. No elbow joint effusion is seen. Diffuse vascular calcifications are seen. The carpal rows appear grossly intact, and demonstrate normal alignment. IMPRESSION: No evidence of fracture or dislocation. Diffuse vascular calcifications seen. Electronically Signed   By: Roanna Raider M.D.   On: 08/29/2016 23:28   Ct Chest Wo Contrast  Result Date: 08/30/2016 CLINICAL DATA:  Follow-up RIGHT lung mass. History of multiple myeloma on chemotherapy. Unresponsive. EXAM: CT CHEST WITHOUT CONTRAST TECHNIQUE: Multidetector CT imaging of the chest was performed following the standard protocol without IV contrast. Creatinine 3.47. COMPARISON:  Chest radiograph August 29, 2016 and priors. FINDINGS: Moderate respiratory motion degraded examination. Cardiovascular: Heart size is mildly enlarged. Moderate coronary artery calcifications. Main pulmonary artery is enlarged, 3.4 cm compatible with chronic pulmonary hypertension. Thoracic aorta is normal in course and caliber with moderate calcific atherosclerosis. Mediastinum/Nodes: No medium spinal mass or lymphadenopathy though evaluation limited by lack of contrast. Lungs/Pleura: Patchy ground-glass consolidation RIGHT lower lobe. Trace pleural effusions. Interlobular septal thickening. Upper Abdomen: Peripherally calcified 16 mm probable mass RIGHT lobe of the liver, assessment limited due to patient motion. Peripancreatic fat stranding and small effusion about the tail of the pancreas. Musculoskeletal: Severe degenerative change of the RIGHT shoulder. Severe osteopenia. 5.3 x 2.3 cm expansile mass within the RIGHT lateral sixth rib with peripheral calcifications and pathologic fracture. Healing bilateral anterior rib fractures. Healing sternal fracture. Lytic lesions in the thoracic spine. Moderate to severe T5, mild T6, severe T7, moderate T9, mild T11, mild T12 and moderate L1 fractures. Retropulsed bony fragments T7 resulting in mild canal stenosis. IMPRESSION: Moderate respiratory motion degraded examination. 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 Multiple healing anterior rib fractures, healing sternal fracture. Myelomatous involvement of the thoracic spine with multiple pathologic fractures. Patchy RIGHT lower lobe possible pneumonia. Interlobular septal thickening most compatible with pulmonary edema. Included pancreas demonstrates apparent acute of pancreatitis, recommend correlation with amylase and lipase. Electronically Signed   By: Awilda Metro M.D.   On: 08/30/2016 02:41   Dg Chest Port 1 View  Result Date:  08/29/2016 CLINICAL DATA:  Acute onset of hypoglycemia. Altered mental status. Patient found unresponsive. Initial encounter. EXAM: PORTABLE CHEST 1 VIEW COMPARISON:  Chest radiograph performed 11/01/2015 FINDINGS: The lungs are hypoexpanded. A 5.3 cm peripheral masslike density is noted at the right lung. This may reflect focal pneumonia or possibly a mass. There is no evidence of pleural effusion or pneumothorax. The cardiomediastinal silhouette is borderline normal in size. No acute osseous abnormalities are seen. IMPRESSION: Lungs hypoexpanded. 5.3 cm peripheral masslike density at the right lung. This may reflect focal pneumonia or possibly mass. CT of the chest would be helpful for further evaluation, when and as deemed clinically appropriate. Electronically Signed   By: Roanna Raider M.D.   On: 08/29/2016 23:33   Dg Hand Complete Right  Result Date: 08/29/2016 CLINICAL DATA:  Acute onset of altered mental status. Right hand swelling and erythema. Initial encounter. EXAM: RIGHT HAND - COMPLETE 3+ VIEW COMPARISON:  None. FINDINGS: There is mild deformity about the distal aspect of the fourth proximal phalanx, with joint space loss. This may  be degenerative or posttraumatic in nature. Surrounding soft tissue swelling is noted. No acute fracture or dislocation is seen. The carpal rows appear grossly intact. Diffuse vascular calcifications are seen. IMPRESSION: 1. No evidence of fracture or dislocation. Mild deformity about the distal aspect of the fourth proximal phalanx, possibly degenerative or posttraumatic in nature. 2. Diffuse vascular calcifications seen. Electronically Signed   By: Garald Balding M.D.   On: 08/29/2016 23:27    Scheduled Meds: . amLODipine  5 mg Oral Daily  . [START ON 09/01/2016] dexamethasone  20 mg Oral Q Mon  . pantoprazole  40 mg Oral Daily  . [START ON 09/01/2016] vancomycin  500 mg Intravenous Q48H  . Warfarin - Pharmacist Dosing Inpatient   Does not apply q1800     Continuous Infusions: .  sodium bicarbonate 150 mEq in sterile water 1000 mL infusion 75 mL/hr at 08/30/16 1100     LOS: 0 days     Annita Brod, MD [facility] Pager [phone]  If 7PM-7AM, please contact night-coverage [email or web address] Password [email or web address] 08/30/2016, 1:06 PM

## 2016-08-30 NOTE — ED Notes (Signed)
Attempted lab draw x 2 but unsuccessful. 

## 2016-08-30 NOTE — ED Notes (Signed)
Unable to draw blood work Gaffer

## 2016-08-30 NOTE — H&P (Signed)
History and Physical    ARLIN SAVONA VOJ:[id number] DOB: Jan 10, 1936 DOA: 08/29/2016   PCP: [name], MD Chief Complaint:  Chief Complaint  Patient presents with  . Hypoglycemia    HPI: [name] is a 80 y.o. 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  Patient presents to the ED with AMS.  Patient family reports that patient was found unresponsive when they checked on her 1 hour after she went to bed.  Patient was diaphoretic, cold, and EMS was called.  CBG was 32 on EMS arrival.  After half AMP of D50, D10, CBG trended up to 82 and now back down to 45 so she is getting more D50.  Her mental status has slowly but surely improved since arrival in the ED.  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  Initial hypothermia with temperature of 96.4, this has improved with warm blankets and treatment of CBG.  WBC 3.0, Platelets of 66, HGB of 15  Review of Systems: As per HPI otherwise 10 point review of systems negative.    Past Medical History:  Diagnosis Date  . Anemia   . Anxiety    takes Xanax daily as needed  . Blood transfusion   . Cardiomyopathy (HCC)   . Deep vein thrombosis of bilateral lower extremities (HCC)   . Degenerative joint disease   . Depression    Mild  . Diabetes mellitus    takes Glipizide daily  . Family history of anesthesia complication    DAUGHTER CPR AFTER  . GERD (gastroesophageal reflux disease)    takes Protonix daily  . Heart murmur   . History of chicken pox   . History of radiation therapy 08/30/13-09/20/13   20 gray to lower lumbar/upper sacrum  . History of shingles   . Hyperlipidemia   . Hypertension    takes Amlodipine,HCTZ,and Lisinopril daily  . Multiple myeloma    per Dr. [name], s/p palliative radiation for leg pain 2011 per Dr. Roselind Messier  . Phlebitis   . UTI (urinary tract infection) 09/08/2013     Past Surgical History:  Procedure Laterality Date  . ABDOMINAL HYSTERECTOMY    . AMPUTATION Left 10/09/2015   Procedure: Left Leg AMPUTATION BELOW KNEE;  Surgeon: Chuck Hint, MD;  Location: [facility] OR;  Service: Vascular;  Laterality: Left;  . ANKLE FRACTURE SURGERY Left   . APPLICATION OF WOUND VAC Left 11/16/2015   Procedure: APPLICATION OF WOUND VAC LEFT BELOW THE KNEE AMPUTEE.;  Surgeon: Chuck Hint, MD;  Location: [facility] OR;  Service: Vascular;  Laterality: Left;  . COLONOSCOPY    . EAR CYST EXCISION Left 04/16/2015   Procedure: EXCISION FACIAL CYST LEFT SIDE ;  Surgeon: Serena Colonel, MD;  Location: [facility];  Service: ENT;  Laterality: Left;  . EYE SURGERY Bilateral    cataract surgery  . I&D EXTREMITY Left 11/16/2015   Procedure: DEBRIDEMENT OF LEFT  BELOW KNEE AMPUTATION ;  Surgeon: Chuck Hint, MD;  Location: [facility] OR;  Service: Vascular;  Laterality: Left;  . PERIPHERAL VASCULAR CATHETERIZATION  08/14/2015   Procedure: Lower Extremity Intervention;  Surgeon: Renford Dills, MD;  Location: [facility] INVASIVE CV LAB;  Service: Cardiovascular;;  . PERIPHERAL VASCULAR CATHETERIZATION N/A 08/14/2015   Procedure: Abdominal Aortogram w/Lower Extremity;  Surgeon: Renford Dills, MD;  Location: [facility] INVASIVE CV LAB;  Service: Cardiovascular;  Laterality: N/A;     reports that she quit smoking about 43 years ago. She has a 13.00 pack-year smoking history. She has never used smokeless tobacco. She reports that she does not drink alcohol or use drugs.  No Known Allergies  Family History  Problem Relation Age of Onset  . Arthritis Other     Parents  . Cancer Other     Colon, 1st degree relative <60  . Diabetes Other     Parents  . Stroke Other     1st degree relative <60  . Diabetes Mother   . Hypertension Mother   . Arthritis Mother   . Stroke Mother   . Diabetes Father   . Hypertension Father   . Arthritis Father   . Stroke Father   .  Cancer Sister     stomach  . Stroke Brother   . Cancer Brother     prostate <50  . Stroke Daughter   . Stroke Son       Prior to Admission medications   Medication Sig Start Date End Date Taking? Authorizing Provider  acetaminophen (TYLENOL) 500 MG tablet Take 500-1,000 mg by mouth every 6 (six) hours as needed for mild pain or headache.    Yes Historical Provider, MD  ALPRAZolam Duanne Moron) 0.5 MG tablet Take 0.25-0.5 mg by mouth 2 (two) times daily as needed for anxiety.   Yes Historical Provider, MD  amLODipine (NORVASC) 5 MG tablet Take 1 tablet (5 mg total) by mouth daily. 06/11/16  Yes Tonia Ghent, MD  dexamethasone (DECADRON) 4 MG tablet Take 20 mg by mouth every Monday.   Yes Historical Provider, MD  diclofenac sodium (VOLTAREN) 1 % GEL Apply 2 g topically 2 (two) times daily as needed (for shoulder pain). 06/11/16  Yes Tonia Ghent, MD  hydrochlorothiazide (MICROZIDE) 12.5 MG capsule Take 1 capsule (12.5 mg total) by mouth daily. 06/11/16  Yes Tonia Ghent, MD  mirtazapine (REMERON) 15 MG tablet Take 7.5 mg by mouth at bedtime as needed (for sleep).    Yes Historical Provider, MD  pantoprazole (PROTONIX) 40 MG tablet Take 1 tablet (40 mg total) by mouth daily. 06/11/16  Yes Tonia Ghent, MD  prochlorperazine (COMPAZINE) 10 MG tablet Take 1 tablet (10 mg total) by mouth every 6 (six) hours as needed for nausea or vomiting. 08/11/16  Yes Curt Bears, MD  traMADol (ULTRAM) 50 MG tablet Take 25-50 mg by mouth every 6 (six) hours as needed for moderate pain.    Yes Historical Provider, MD  warfarin (COUMADIN) 5 MG tablet Take 1 tablet (5 mg total) by mouth as directed. Take 5 mg po Monday, Wednesday, Friday.  Take 2.5 mg po all other days. 08/20/16  Yes Velvet Bathe, MD    Physical Exam: Vitals:   08/29/16 2258 08/29/16 2326 08/30/16 0033 08/30/16 0310  BP: 155/70  (!) 145/108 169/88  Pulse: 65  65 78  Resp: '14  17 19  '$ Temp:  (S) (!) 96.4 F (35.8 C)    TempSrc:   (S) Rectal    SpO2: 100%  100% 100%      Constitutional: NAD, calm, comfortable Eyes: PERRL, lids and conjunctivae normal ENMT: Mucous membranes are moist. Posterior pharynx clear of any exudate or lesions.Normal dentition.  Neck: normal, supple, no masses, no thyromegaly Respiratory: clear to auscultation bilaterally, no wheezing, no crackles. Normal respiratory effort. No accessory muscle use.  Cardiovascular: Regular rate and rhythm, no murmurs / rubs /  gallops. No extremity edema. 2+ pedal pulses. No carotid bruits.  Abdomen: no tenderness, no masses palpated. No hepatosplenomegaly. Bowel sounds positive.  Musculoskeletal: no clubbing / cyanosis. No joint deformity upper and lower extremities. Good ROM, no contractures. Normal muscle tone.  Skin: no rashes, lesions, ulcers. No induration Neurologic: CN 2-12 grossly intact. Sensation intact, DTR normal. Strength 5/5 in all 4.  Psychiatric: Normal judgment and insight. Alert and oriented x 3. Normal mood.    Labs on Admission: I have personally reviewed following labs and imaging studies  CBC:  Recent Labs Lab 08/26/16 1131 08/29/16 2313  WBC 7.4 3.0*  NEUTROABS 5.4 2.6  HGB 6.5* 15.0  HCT 19.4* 42.6  MCV 82.1 82.7  PLT 175 66*   Basic Metabolic Panel:  Recent Labs Lab 08/26/16 1137 08/29/16 2313  NA 148* 135  K 3.5 2.6*  CL  --  102  CO2 21* 16*  GLUCOSE 91 134*  BUN 52.1* 66*  CREATININE 3.4* 3.47*  CALCIUM 13.4* 9.2   GFR: Estimated Creatinine Clearance: 9.6 mL/min (by C-G formula based on SCr of 3.47 mg/dL (H)). Liver Function Tests:  Recent Labs Lab 08/26/16 1137 08/29/16 2313  AST 144* 382*  ALT 16 29  ALKPHOS 101 77  BILITOT 0.25 <0.1*  PROT 7.2 6.5  ALBUMIN 3.4* 3.3*    Recent Labs Lab 08/29/16 2313  LIPASE 95*   No results for input(s): AMMONIA in the last 168 hours. Coagulation Profile:  Recent Labs Lab 08/26/16 1131 08/29/16 2357  INR 4.30* 1.51  PROTIME 51.6*  --    Cardiac  Enzymes: No results for input(s): CKTOTAL, CKMB, CKMBINDEX, TROPONINI in the last 168 hours. BNP (last 3 results) No results for input(s): PROBNP in the last 8760 hours. HbA1C: No results for input(s): HGBA1C in the last 72 hours. CBG:  Recent Labs Lab 08/29/16 2253 08/30/16 0020 08/30/16 0025 08/30/16 0139 08/30/16 0257  GLUCAP 139* 38* 51* 82 45*   Lipid Profile: No results for input(s): CHOL, HDL, LDLCALC, TRIG, CHOLHDL, LDLDIRECT in the last 72 hours. Thyroid Function Tests: No results for input(s): TSH, T4TOTAL, FREET4, T3FREE, THYROIDAB in the last 72 hours. Anemia Panel: No results for input(s): VITAMINB12, FOLATE, FERRITIN, TIBC, IRON, RETICCTPCT in the last 72 hours. Urine analysis:    Component Value Date/Time   COLORURINE STRAW (A) 08/29/2016 2335   APPEARANCEUR CLEAR 08/29/2016 2335   LABSPEC 1.011 08/29/2016 2335   LABSPEC 1.020 10/19/2013 1546   PHURINE 6.0 08/29/2016 2335   GLUCOSEU 50 (A) 08/29/2016 2335   GLUCOSEU 1,000 10/19/2013 1546   HGBUR SMALL (A) 08/29/2016 2335   BILIRUBINUR NEGATIVE 08/29/2016 2335   BILIRUBINUR negative 05/18/2015 1545   BILIRUBINUR Negative 10/19/2013 1546   KETONESUR NEGATIVE 08/29/2016 2335   PROTEINUR 30 (A) 08/29/2016 2335   UROBILINOGEN negative 05/18/2015 1545   UROBILINOGEN 0.2 03/10/2014 0007   UROBILINOGEN 0.2 10/19/2013 1546   NITRITE NEGATIVE 08/29/2016 2335   LEUKOCYTESUR NEGATIVE 08/29/2016 2335   LEUKOCYTESUR Negative 10/19/2013 1546   Sepsis Labs: '@LABRCNTIP'$ (procalcitonin:4,lacticidven:4) )No results found for this or any previous visit (from the past 240 hour(s)).   Radiological Exams on Admission: Dg Forearm Right  Result Date: 08/29/2016 CLINICAL DATA:  Acute onset of right forearm erythema and swelling. Initial encounter. EXAM: RIGHT FOREARM - 2 VIEW COMPARISON:  Right elbow radiographs performed 07/20/2014 FINDINGS: There is no evidence of fracture or dislocation. The radius and ulna appear grossly  intact. The elbow joint is grossly unremarkable. No elbow joint effusion is seen.  Diffuse vascular calcifications are seen. The carpal rows appear grossly intact, and demonstrate normal alignment. IMPRESSION: No evidence of fracture or dislocation. Diffuse vascular calcifications seen. Electronically Signed   By: Garald Balding M.D.   On: 08/29/2016 23:28   Ct Chest Wo Contrast  Result Date: 08/30/2016 CLINICAL DATA:  Follow-up RIGHT lung mass. History of multiple myeloma on chemotherapy. Unresponsive. EXAM: CT CHEST WITHOUT CONTRAST TECHNIQUE: Multidetector CT imaging of the chest was performed following the standard protocol without IV contrast. Creatinine 3.47. COMPARISON:  Chest radiograph August 29, 2016 and priors. FINDINGS: Moderate respiratory motion degraded examination. Cardiovascular: Heart size is mildly enlarged. Moderate coronary artery calcifications. Main pulmonary artery is enlarged, 3.4 cm compatible with chronic pulmonary hypertension. Thoracic aorta is normal in course and caliber with moderate calcific atherosclerosis. Mediastinum/Nodes: No medium spinal mass or lymphadenopathy though evaluation limited by lack of contrast. Lungs/Pleura: Patchy ground-glass consolidation RIGHT lower lobe. Trace pleural effusions. Interlobular septal thickening. Upper Abdomen: Peripherally calcified 16 mm probable mass RIGHT lobe of the liver, assessment limited due to patient motion. Peripancreatic fat stranding and small effusion about the tail of the pancreas. Musculoskeletal: Severe degenerative change of the RIGHT shoulder. Severe osteopenia. 5.3 x 2.3 cm expansile mass within the RIGHT lateral sixth rib with peripheral calcifications and pathologic fracture. Healing bilateral anterior rib fractures. Healing sternal fracture. Lytic lesions in the thoracic spine. Moderate to severe T5, mild T6, severe T7, moderate T9, mild T11, mild T12 and moderate L1 fractures. Retropulsed bony fragments T7 resulting  in mild canal stenosis. IMPRESSION: Moderate respiratory motion degraded examination. 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 Multiple healing anterior rib fractures, healing sternal fracture. Myelomatous involvement of the thoracic spine with multiple pathologic fractures. Patchy RIGHT lower lobe possible pneumonia. Interlobular septal thickening most compatible with pulmonary edema. Included pancreas demonstrates apparent acute of pancreatitis, recommend correlation with amylase and lipase. Electronically Signed   By: Elon Alas M.D.   On: 08/30/2016 02:41   Dg Chest Port 1 View  Result Date: 08/29/2016 CLINICAL DATA:  Acute onset of hypoglycemia. Altered mental status. Patient found unresponsive. Initial encounter. EXAM: PORTABLE CHEST 1 VIEW COMPARISON:  Chest radiograph performed 11/01/2015 FINDINGS: The lungs are hypoexpanded. A 5.3 cm peripheral masslike density is noted at the right lung. This may reflect focal pneumonia or possibly a mass. There is no evidence of pleural effusion or pneumothorax. The cardiomediastinal silhouette is borderline normal in size. No acute osseous abnormalities are seen. IMPRESSION: Lungs hypoexpanded. 5.3 cm peripheral masslike density at the right lung. This may reflect focal pneumonia or possibly mass. CT of the chest would be helpful for further evaluation, when and as deemed clinically appropriate. Electronically Signed   By: Garald Balding M.D.   On: 08/29/2016 23:33   Dg Hand Complete Right  Result Date: 08/29/2016 CLINICAL DATA:  Acute onset of altered mental status. Right hand swelling and erythema. Initial encounter. EXAM: RIGHT HAND - COMPLETE 3+ VIEW COMPARISON:  None. FINDINGS: There is mild deformity about the distal aspect of the fourth proximal phalanx, with joint space loss. This may be degenerative or posttraumatic in nature. Surrounding soft tissue  swelling is noted. No acute fracture or dislocation is seen. The carpal rows appear grossly intact. Diffuse vascular calcifications are seen. IMPRESSION: 1. No evidence of fracture or dislocation. Mild deformity about the distal aspect of the fourth proximal phalanx, possibly degenerative or posttraumatic in nature. 2. Diffuse  vascular calcifications seen. Electronically Signed   By: Garald Balding M.D.   On: 08/29/2016 23:27    EKG: Independently reviewed.  Assessment/Plan Principal Problem:   Hypoglycemia secondary to sulfonylurea Active Problems:   Multiple myeloma (HCC)   Diabetes mellitus without complication (HCC)   Essential hypertension   Hypokalemia   Chronic anticoagulation   Metabolic acidosis, normal anion gap (NAG)   Cellulitis of right forearm   Myeloma kidney (HCC)   Hypomagnesemia    1. Hypoglycemia due to sulfonylurea - 1. Stop glipizide 2. D10 gtt 3. Q1H CBG checks 2. NAG metabolic acidosis, Myeloma kidney -  1. Bicarb gtt 2. Q4H BMPs to follow 3. Hypokalemia - 1. 26mq IV currently running and 40 PO given 2. Q4H BMP to follow, especially as we are competing with the insulin which is driving K into cells further reducing this 3. Holding HCTZ 4. Anemia - im having them re-draw this since HGB of 15 doesn't make sense in this chronic anemia patient, this would be the best HGB she has had in a very long time, got 2 units for HGB of 6.5 on Wed but this dosent make sense to jump to 15.  Likewise I dont believe the thrombocytopenia as I am not at all convinced that this CBC is from the correct patient!  Actually, im just going to have them re-draw all labs at this point.  Some of the labs though do make sense (kidney function looks about the same as 4 day ago, INR is theraputic on patient who is supposed to be on coumadin, etc). 5. Cellulitis of R forearm - Do not currently think patient is septic, think that her hypothermia was due to hypoglycemia (hypothermia now noted  to have improved), and im not even convinced that the CBC is accurate on this patient (see anemia discussion above). 1. Will back down zosyn and vanc to just vanc for the cellulitis at this point 2. Not convinced she has a significant PNA despite radiologist hedging on CT scan 1. BNP also pending 3. Mild abdominal tenderness to palpitation - Lipase pending given finding on CT scan 6. Multiple Myeloma -  1. IP consult to oncology placed in computer as per routine when admitting chemo patient 2. Of note her work up in the ED today has demonstrated a ?new? Likely Plasmacytoma on CT chest. 7. Hypomagnesemia - replacing 8. Chronic anticoagulation - continue coumadin for recurrent DVTs 9. DM - stopping the glipizide and just control with carb mod diet 10. HTN - continue norvasc, hold HCTZ due to hypokalemia   DVT prophylaxis: Coumadin Code Status: Full Family Communication: Family at bedside Consults called: None Admission status: Admit to inpatient   GEtta QuillDO [facility] Pager [phone] 7PM-7AM  If 7AM-7PM, please contact the day physician for the patient [email or web address] Password [email or web address]  08/30/2016, 3:15 AM

## 2016-08-30 NOTE — Progress Notes (Signed)
Patient received from ICU to Room 1403. Oriented to room and equipment. Bed alarm on. Call bell within reach.

## 2016-08-30 NOTE — Progress Notes (Signed)
VASCULAR LAB PRELIMINARY  PRELIMINARY  PRELIMINARY  PRELIMINARY  Right upper extremity venous duplex completed.    Preliminary report:  There is no DVT or SVT noted in the right upper extremity.   Jaryn Rosko, RVT 08/30/2016, 7:46 PM

## 2016-08-31 DIAGNOSIS — N179 Acute kidney failure, unspecified: Secondary | ICD-10-CM

## 2016-08-31 LAB — CBC
HEMATOCRIT: 31.6 % — AB (ref 36.0–46.0)
Hemoglobin: 10.9 g/dL — ABNORMAL LOW (ref 12.0–15.0)
MCH: 29 pg (ref 26.0–34.0)
MCHC: 34.5 g/dL (ref 30.0–36.0)
MCV: 84 fL (ref 78.0–100.0)
PLATELETS: 46 10*3/uL — AB (ref 150–400)
RBC: 3.76 MIL/uL — AB (ref 3.87–5.11)
RDW: 16.5 % — ABNORMAL HIGH (ref 11.5–15.5)
WBC: 3.2 10*3/uL — AB (ref 4.0–10.5)

## 2016-08-31 LAB — URINE CULTURE

## 2016-08-31 LAB — GLUCOSE, CAPILLARY
GLUCOSE-CAPILLARY: 115 mg/dL — AB (ref 65–99)
GLUCOSE-CAPILLARY: 161 mg/dL — AB (ref 65–99)
GLUCOSE-CAPILLARY: 144 mg/dL — AB (ref 65–99)
Glucose-Capillary: 115 mg/dL — ABNORMAL HIGH (ref 65–99)
Glucose-Capillary: 141 mg/dL — ABNORMAL HIGH (ref 65–99)

## 2016-08-31 LAB — BASIC METABOLIC PANEL
Anion gap: 13 (ref 5–15)
BUN: 48 mg/dL — ABNORMAL HIGH (ref 6–20)
CHLORIDE: 98 mmol/L — AB (ref 101–111)
CO2: 28 mmol/L (ref 22–32)
CREATININE: 2.65 mg/dL — AB (ref 0.44–1.00)
Calcium: 7.6 mg/dL — ABNORMAL LOW (ref 8.9–10.3)
GFR calc non Af Amer: 16 mL/min — ABNORMAL LOW (ref >60)
GFR, EST AFRICAN AMERICAN: 19 mL/min — AB (ref >60)
Glucose, Bld: 193 mg/dL — ABNORMAL HIGH (ref 65–99)
POTASSIUM: 3 mmol/L — AB (ref 3.5–5.1)
SODIUM: 139 mmol/L (ref 135–145)

## 2016-08-31 LAB — PROTIME-INR
INR: 1.62
Prothrombin Time: 19.4 seconds — ABNORMAL HIGH (ref 11.4–15.2)

## 2016-08-31 MED ORDER — LEVOFLOXACIN 500 MG PO TABS: 250.0000 mg | ORAL_TABLET | ORAL | Status: DC

## 2016-08-31 MED ORDER — WARFARIN SODIUM 2.5 MG PO TABS
2.5000 mg | ORAL_TABLET | Freq: Once | ORAL | Status: AC
  Administered 2016-08-31: 2.5 mg via ORAL
  Filled 2016-08-31: qty 1

## 2016-08-31 MED ORDER — LEVOFLOXACIN 750 MG PO TABS
750.0000 mg | ORAL_TABLET | Freq: Once | ORAL | Status: AC
  Administered 2016-08-31: 750 mg via ORAL
  Filled 2016-08-31: qty 1

## 2016-08-31 NOTE — Progress Notes (Signed)
ANTICOAGULATION CONSULT NOTE   Pharmacy Consult for Warfarin Indication: Hx DVT  No Known Allergies  Patient Measurements: Height: '5\' 1"'$  (154.9 cm) Weight: 96 lb 5.5 oz (43.7 kg) IBW/kg (Calculated) : 47.8  Vital Signs: Temp: 99.1 F (37.3 C) (12/31 0417) Temp Source: Oral (12/31 0417) BP: 146/73 (12/31 0417) Pulse Rate: 74 (12/31 0417)  Labs:  Recent Labs  08/29/16 2313 08/29/16 2357  08/30/16 0315 08/30/16 0641 08/30/16 1120 08/31/16 0907  HGB 15.0  --   --  11.6*  --   --  10.9*  HCT 42.6  --   --  33.0*  --   --  31.6*  PLT 66*  --   --  69*  --   --  46*  LABPROT  --  18.4*  --   --   --   --  19.4*  INR  --  1.51  --   --   --   --  1.62  CREATININE 3.47*  --   < >  --  3.05* 2.98* 2.65*  < > = values in this interval not displayed.  Estimated Creatinine Clearance: 12.1 mL/min (by C-G formula based on SCr of 2.65 mg/dL (H)).   Medical History: Past Medical History:  Diagnosis Date  . Anemia   . Anxiety    takes Xanax daily as needed  . Blood transfusion   . Cardiomyopathy ([place])   . Deep vein thrombosis of bilateral lower extremities (HCC)   . Degenerative joint disease   . Depression    Mild  . Diabetes mellitus    takes Glipizide daily  . Family history of anesthesia complication    DAUGHTER CPR AFTER  . GERD (gastroesophageal reflux disease)    takes Protonix daily  . Heart murmur   . History of chicken pox   . History of radiation therapy 08/30/13-09/20/13   20 gray to lower lumbar/upper sacrum  . History of shingles   . Hyperlipidemia   . Hypertension    takes Amlodipine,HCTZ,and Lisinopril daily  . Multiple myeloma    per Dr. Julien Nordmann, s/p palliative radiation for leg pain 2011 per Dr. Sondra Come  . Phlebitis   . UTI (urinary tract infection) 09/08/2013    Assessment:  80 yr female known to pharmacy from vancomycin and Zosyn dosing for sepsis.  PTA patient on warfarin for h/o bilateral lower DVT's  PTA warfarin regimen = 2.'5mg'$  daily  except '5mg'$  on MWF  Last dose taken on 12/26 when INR = 4.3 and pt instructed by PCP to hold warfarin  INR (12/29) = 1.51  Upon admission, pharmacy was consulted to dose warfarin  Goal of Therapy:  INR 2-3  Today: Small INR increase after warfarin 2.'5mg'$  x 1 yesterday morning Potential drug interactions with warfarin:  Levofloxacin (can increase INR response)  Dexamethasone (can increase INR response) Diet: regular Thrombocytopenia noted (chronic in setting of multiple myeloma) No bleeding reported  Plan:  Warfarin 2.5 mg PO x 1 today PT/INR daily while inpatient Monitor H/H, pltc, any reports of bleeding  Clayburn Pert, PharmD, BCPS Pager: [phone] 08/31/2016  11:52 AM

## 2016-08-31 NOTE — Progress Notes (Signed)
Pharmacy Antibiotic Note  80 y/o F with multiple myeloma, DM, admitted with acute metabolic encephalopathy and worsening renal failure attirubted to sulfonylurea therapy, was found to have cellulitis of R forearm.  Currently receiving vancomycin; orders received from Dr. Lyman Speller to de-escalate to PO Levaquin; pharmacy dosing assistance requested in setting of renal insufficiency.   See trends of SCr values below.  Patient has known CKD secondary to multiple myeloma  Plan: Levofloxacin 750 mg PO loading dose x 1, then 250 mg PO daily Duration of therapy to be determined by MD    Antimicrobials this admission:  12/29 zosyn >>  12/30 12/30 vanc >> 12/31 12/31 Levofloxacin >>   Height: '5\' 1"'$  (154.9 cm) Weight: 96 lb 5.5 oz (43.7 kg) IBW/kg (Calculated) : 47.8  Temp (24hrs), Avg:99.4 F (37.4 C), Min:98.6 F (37 C), Max:100.1 F (37.8 C)   Recent Labs Lab 08/26/16 1131  08/29/16 2313 08/29/16 2314 08/30/16 0302 08/30/16 0305 08/30/16 0315 08/30/16 0641 08/30/16 1120 08/31/16 0907  WBC 7.4  --  3.0*  --   --   --  2.7*  --   --  3.2*  CREATININE  --   < > 3.47*  --  2.88*  --   --  3.05* 2.98* 2.65*  LATICACIDVEN  --   --   --  2.61*  --  1.70  --   --   --   --   < > = values in this interval not displayed.  Estimated Creatinine Clearance: 12.1 mL/min (by C-G formula based on SCr of 2.65 mg/dL (H)).    No Known Allergies  Thank you for allowing pharmacy to be a part of this patient's care.  Clayburn Pert, PharmD, BCPS Pager: [phone] 08/31/2016  12:07 PM

## 2016-08-31 NOTE — Telephone Encounter (Signed)
Noted. Thanks.

## 2016-08-31 NOTE — Progress Notes (Signed)
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  Patient found to be persistently hypoglycemic. Following several amps of D50 and then continue D10 fluids, CBGs started to stabilize. Patient began to wake up and become more alert. She was also noted on admission to have left upper extremity swelling, induration, tenderness and mild erythema in her forearm. This was thought to possibly be cellulitis versus venous thrombosis and patient started on antibiotics.  Her INR was noted to be subtherapeutic. It had been elevated prior to admission on 12/26 at 4.3 and she was likely told to hold this medication for several days.  Upper extremity Doppler ruled out DVT. Hypoglycemia resolved and patient able to be transferred to medical floor on 12/30. Also in the emergency room, chest x-ray noted possible mass but on CT confirmed the possible pneumonia. She's also noted to have a plasmacytoma lesion infiltrating rib.  Today patient is doing much better. She says she feels better with no complaints. According to family, appetite significantly improving.  Assessment/Plan: Principal Problem:   Hypoglycemia causing acute metabolic encephalopathy secondary to sulfonylurea: Secondary to decreased clearance brought on by worsening renal failure. Medication discontinued. Patient much improved. Encephalopathy resolved.  Active Problems:   Multiple myeloma ([place]): Oncology notified of admission.    Essential hypertension: Continue home medications   Hypokalemia: Replacing.   Chronic anticoagulation: INR subtherapeutic. Ruling out upper extremity thrombus. Coumadin per pharmacy. Slowly  trending back upward   Metabolic acidosis, normal anion gap (NAG)   Cellulitis of right forearm/swelling: Negative ultrasound. Change antibiotics to Levaquin to cover both that and pneumonia.    Acute kidney injury in the setting of Myeloma kidney (HCC)/chronic kidney disease, stage III: Continue hydration. Renal function slowly improving back to baseline    Hypomagnesemia: Replacing. Pathologic rib fracture/ mass:? Plasmacytoma. We'll discuss with oncology  ? Pneumonia: Family reports an increased cough. We'll go ahead and check pro-calcitonin level.  Noted elevated, so we'll switch antibiotic coverage to Levaquin to cover both cellulitis and pneumonia.  Lactic acidosis: On admission. Do not think this is sepsis, elevated level secondary to dehydration  Code Status: Full code   Family Communication: Multiple daughters at the bedside   Disposition Plan: Continues to improve. Potential discharge tomorrow once renal function normalized.   Consultants:  Oncology notified of admission   Procedures:  Right upper extremity ultrasound done 12/30: No evidence of DVT  Antimicrobials:  IV Zosyn and vancomycin 12/29-present   DVT prophylaxis: Coumadin per pharmacy   Objective: Vitals:   08/30/16 1800 08/30/16 1835 08/30/16 2237 08/31/16 0417  BP: 112/79 129/85 (!) 159/75 (!) 146/73  Pulse: 79 80 73 74  Resp: (!) '21 19 15 14  '$ Temp:  99.9 F (37.7 C) 98.6 F (37 C) 99.1 F (37.3 C)  TempSrc:  Oral Oral Oral  SpO2: 96% 100% 100% 100%  Weight:      Height:        Intake/Output Summary (Last 24 hours) at 08/31/16 1223 Last data filed at 08/31/16 0930  Gross per 24 hour  Intake          1974.25 ml  Output  0 ml  Net          1974.25 ml   Filed Weights   08/30/16 0800  Weight: 43.7 kg (96 lb 5.5 oz)    Exam:   General:  Alert and oriented 2, More awake today. No acute distress  Cardiovascular: Regular rate and rhythm, S1-S2   Respiratory: Clear to  auscultation bilaterally   Abdomen: Soft, nontender, nondistended, positive bowel sounds   Musculoskeletal: Right upper extremity Less indurated and swollen today  Skin: As above, otherwise no skin breaks, tears or lesions  Psychiatry: Patient seems appropriate, no evidence of psychoses. Patient still not 100%    Data Reviewed: CBC:  Recent Labs Lab 08/26/16 1131 08/29/16 2313 08/30/16 0315 08/31/16 0907  WBC 7.4 3.0* 2.7* 3.2*  NEUTROABS 5.4 2.6  --   --   HGB 6.5* 15.0 11.6* 10.9*  HCT 19.4* 42.6 33.0* 31.6*  MCV 82.1 82.7 80.3 84.0  PLT 175 66* 69* 46*   Basic Metabolic Panel:  Recent Labs Lab 08/29/16 2313 08/30/16 0302 08/30/16 0641 08/30/16 1120 08/31/16 0907  NA 135 140 140 139 139  K 2.6* 4.2 4.0 3.4* 3.0*  CL 102 109 111 106 98*  CO2 16* 17* 19* 20* 28  GLUCOSE 134* 49* 72 129* 193*  BUN 66* 59* 60* 55* 48*  CREATININE 3.47* 2.88* 3.05* 2.98* 2.65*  CALCIUM 9.2 7.9* 8.1* 8.0* 7.6*  MG  --  1.3*  --   --   --    GFR: Estimated Creatinine Clearance: 12.1 mL/min (by C-G formula based on SCr of 2.65 mg/dL (H)). Liver Function Tests:  Recent Labs Lab 08/26/16 1137 08/29/16 2313  AST 144* 382*  ALT 16 29  ALKPHOS 101 77  BILITOT 0.25 <0.1*  PROT 7.2 6.5  ALBUMIN 3.4* 3.3*    Recent Labs Lab 08/29/16 2313  LIPASE 95*   No results for input(s): AMMONIA in the last 168 hours. Coagulation Profile:  Recent Labs Lab 08/26/16 1131 08/29/16 2357 08/31/16 0907  INR 4.30* 1.51 1.62  PROTIME 51.6*  --   --    Cardiac Enzymes: No results for input(s): CKTOTAL, CKMB, CKMBINDEX, TROPONINI in the last 168 hours. BNP (last 3 results) No results for input(s): PROBNP in the last 8760 hours. HbA1C: No results for input(s): HGBA1C in the last 72 hours. CBG:  Recent Labs Lab 08/30/16 1945 08/31/16 0004 08/31/16 0421 08/31/16 0726 08/31/16 1122  GLUCAP 159* 141* 115* 115* 161*   Lipid Profile: No results for input(s): CHOL, HDL, LDLCALC,  TRIG, CHOLHDL, LDLDIRECT in the last 72 hours. Thyroid Function Tests: No results for input(s): TSH, T4TOTAL, FREET4, T3FREE, THYROIDAB in the last 72 hours. Anemia Panel: No results for input(s): VITAMINB12, FOLATE, FERRITIN, TIBC, IRON, RETICCTPCT in the last 72 hours. Urine analysis:    Component Value Date/Time   COLORURINE STRAW (A) 08/29/2016 2335   APPEARANCEUR CLEAR 08/29/2016 2335   LABSPEC 1.011 08/29/2016 2335   LABSPEC 1.020 10/19/2013 1546   PHURINE 6.0 08/29/2016 2335   GLUCOSEU 50 (A) 08/29/2016 2335   GLUCOSEU 1,000 10/19/2013 1546   HGBUR SMALL (A) 08/29/2016 2335   BILIRUBINUR NEGATIVE 08/29/2016 2335   BILIRUBINUR negative 05/18/2015 1545   BILIRUBINUR Negative 10/19/2013 [place] 08/29/2016 2335   PROTEINUR 30 (A) 08/29/2016 2335   UROBILINOGEN negative 05/18/2015 1545   UROBILINOGEN 0.2 03/10/2014 0007   UROBILINOGEN 0.2 10/19/2013 1546   NITRITE NEGATIVE 08/29/2016 2335   LEUKOCYTESUR NEGATIVE 08/29/2016 2335   LEUKOCYTESUR Negative  10/19/2013 1546   Sepsis Labs: '@LABRCNTIP'$ (procalcitonin:4,lacticidven:4)  ) Recent Results (from the past 240 hour(s))  Urine culture     Status: Abnormal   Collection Time: 08/29/16 11:35 PM  Result Value Ref Range Status   Specimen Description URINE, CLEAN CATCH  Final   Special Requests NONE  Final   Culture MULTIPLE SPECIES PRESENT, SUGGEST RECOLLECTION (A)  Final   Report Status 08/31/2016 FINAL  Final  MRSA PCR Screening     Status: None   Collection Time: 08/30/16  4:08 AM  Result Value Ref Range Status   MRSA by PCR NEGATIVE NEGATIVE Final    Comment:        The GeneXpert MRSA Assay (FDA approved for NASAL specimens only), is one component of a comprehensive MRSA colonization surveillance program. It is not intended to diagnose MRSA infection nor to guide or monitor treatment for MRSA infections.       Studies: No results found.  Scheduled Meds: . amLODipine  5 mg Oral Daily  .  [START ON 09/01/2016] dexamethasone  20 mg Oral Q Mon  . [START ON 09/01/2016] levofloxacin  250 mg Oral Q24H  . levofloxacin  750 mg Oral Once  . pantoprazole  40 mg Oral Daily  . warfarin  2.5 mg Oral ONCE-1800  . Warfarin - Pharmacist Dosing Inpatient   Does not apply q1800    Continuous Infusions: .  sodium bicarbonate 150 mEq in sterile water 1000 mL infusion 75 mL/hr at 08/31/16 0328     LOS: 1 day     Annita Brod, MD [facility] Pager [phone]  If 7PM-7AM, please contact night-coverage [email or web address] Password [facility] 08/31/2016, 12:23 PM

## 2016-09-01 DIAGNOSIS — D696 Thrombocytopenia, unspecified: Secondary | ICD-10-CM

## 2016-09-01 DIAGNOSIS — N179 Acute kidney failure, unspecified: Secondary | ICD-10-CM | POA: Diagnosis present

## 2016-09-01 LAB — CBC
HEMATOCRIT: 30.9 % — AB (ref 36.0–46.0)
HEMOGLOBIN: 10.5 g/dL — AB (ref 12.0–15.0)
MCH: 29.2 pg (ref 26.0–34.0)
MCHC: 34 g/dL (ref 30.0–36.0)
MCV: 85.8 fL (ref 78.0–100.0)
Platelets: 45 10*3/uL — ABNORMAL LOW (ref 150–400)
RBC: 3.6 MIL/uL — ABNORMAL LOW (ref 3.87–5.11)
RDW: 16.4 % — AB (ref 11.5–15.5)
WBC: 2.4 10*3/uL — AB (ref 4.0–10.5)

## 2016-09-01 LAB — BASIC METABOLIC PANEL
ANION GAP: 14 (ref 5–15)
BUN: 38 mg/dL — ABNORMAL HIGH (ref 6–20)
CALCIUM: 7.4 mg/dL — AB (ref 8.9–10.3)
CHLORIDE: 95 mmol/L — AB (ref 101–111)
CO2: 32 mmol/L (ref 22–32)
Creatinine, Ser: 2.65 mg/dL — ABNORMAL HIGH (ref 0.44–1.00)
GFR calc non Af Amer: 16 mL/min — ABNORMAL LOW (ref >60)
GFR, EST AFRICAN AMERICAN: 19 mL/min — AB (ref >60)
GLUCOSE: 141 mg/dL — AB (ref 65–99)
Potassium: 2.9 mmol/L — ABNORMAL LOW (ref 3.5–5.1)
Sodium: 141 mmol/L (ref 135–145)

## 2016-09-01 LAB — GLUCOSE, CAPILLARY
GLUCOSE-CAPILLARY: 149 mg/dL — AB (ref 65–99)
GLUCOSE-CAPILLARY: 142 mg/dL — AB (ref 65–99)
GLUCOSE-CAPILLARY: 240 mg/dL — AB (ref 65–99)
GLUCOSE-CAPILLARY: 234 mg/dL — AB (ref 65–99)
GLUCOSE-CAPILLARY: 188 mg/dL — AB (ref 65–99)
Glucose-Capillary: 139 mg/dL — ABNORMAL HIGH (ref 65–99)
Glucose-Capillary: 141 mg/dL — ABNORMAL HIGH (ref 65–99)

## 2016-09-01 LAB — PROTIME-INR
INR: 1.78
Prothrombin Time: 20.9 seconds — ABNORMAL HIGH (ref 11.4–15.2)

## 2016-09-01 LAB — LACTIC ACID, PLASMA: LACTIC ACID, VENOUS: 1.3 mmol/L (ref 0.5–1.9)

## 2016-09-01 LAB — PROCALCITONIN: Procalcitonin: 1.17 ng/mL

## 2016-09-01 MED ORDER — VANCOMYCIN HCL 500 MG IV SOLR
500.0000 mg | INTRAVENOUS | Status: DC
  Administered 2016-09-01 – 2016-09-03 (×2): 500 mg via INTRAVENOUS
  Filled 2016-09-01 (×2): qty 500

## 2016-09-01 MED ORDER — VANCOMYCIN HCL IN DEXTROSE 1-5 GM/200ML-% IV SOLN: 1000.0000 mg | Freq: Once | INTRAVENOUS | Status: DC

## 2016-09-01 MED ORDER — POTASSIUM CHLORIDE CRYS ER 20 MEQ PO TBCR
40.0000 meq | EXTENDED_RELEASE_TABLET | Freq: Two times a day (BID) | ORAL | Status: AC
  Administered 2016-09-01 (×2): 40 meq via ORAL
  Filled 2016-09-01 (×2): qty 2

## 2016-09-01 MED ORDER — PIPERACILLIN-TAZOBACTAM IN DEX 2-0.25 GM/50ML IV SOLN
2.2500 g | Freq: Three times a day (TID) | INTRAVENOUS | Status: DC
  Administered 2016-09-01 – 2016-09-05 (×11): 2.25 g via INTRAVENOUS
  Filled 2016-09-01 (×12): qty 50

## 2016-09-01 MED ORDER — POTASSIUM CHLORIDE CRYS ER 20 MEQ PO TBCR
40.0000 meq | EXTENDED_RELEASE_TABLET | Freq: Every day | ORAL | Status: DC
  Administered 2016-09-02: 40 meq via ORAL
  Filled 2016-09-01: qty 2

## 2016-09-01 MED ORDER — POTASSIUM CHLORIDE 20 MEQ/15ML (10%) PO SOLN
40.0000 meq | Freq: Two times a day (BID) | ORAL | Status: DC
  Filled 2016-09-01: qty 30

## 2016-09-01 NOTE — Evaluation (Signed)
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 Pt then became agitated with therapist and refused to participate any further. Pt was under impression PT was forcing her to walk. Pt continued to refuse to participate despite therapist and daughter's encouragement. Discussed d/c plan with daughter-pt will return home with family assistance and HHPT. Will follow and progress activity as able.     Follow Up Recommendations Home health PT;Supervision/Assistance - 24 hour    Equipment Recommendations  None recommended by PT    Recommendations for Other Services OT consult     Precautions / Restrictions Precautions Precautions: Fall Precaution Comments: L BKA Restrictions Weight Bearing Restrictions: No LLE Weight Bearing: Non weight bearing      Mobility  Bed Mobility Overal bed mobility: Needs Assistance Bed Mobility: Supine to Sit;Sit to Supine     Supine to sit: Mod assist;HOB elevated Sit to supine: Mod assist;HOB elevated   General bed mobility comments: Assisted with trunk and LEs. Utilized bedpad for scooting, positioning. Pt partially sat up at EOB but then became agitated with therapist and refused to participate any further.   Transfers                 General transfer comment: NT-pt refused  Ambulation/Gait                Stairs            Wheelchair Mobility    Modified Rankin (Stroke Patients Only)       Balance                                             Pertinent Vitals/Pain Pain Assessment: Faces Faces Pain Scale: No hurt    Home Living Family/patient expects to be discharged to:: Private residence Living Arrangements:  Children Available Help at Discharge: Family;Available 24 hours/day Type of Home: House Home Access: Ramped entrance     Home Layout: One level Home Equipment: Walker - 2 wheels;Bedside commode;Wheelchair - manual Additional Comments: son is w/c bound    Prior Function Level of Independence: Needs assistance   Gait / Transfers Assistance Needed: Walking household distances with RW.  ADL's / Homemaking Assistance Needed: Sponge bathed, dressed, self fed, toileted modified independently. Performed light housekeeping and laundry, Son did houskeeping        Journalist, newspaper        Extremity/Trunk Assessment   Upper Extremity Assessment Upper Extremity Assessment: RUE deficits/detail RUE Deficits / Details: swollen    Lower Extremity Assessment Lower Extremity Assessment: RLE deficits/detail;LLE deficits/detail RLE Deficits / Details: Strength at least 2/5. Pt did not put forth maximal effort during session LLE Deficits / Details: L BKA       Communication   Communication: HOH  Cognition Arousal/Alertness: Awake/alert Behavior During Therapy: Agitated Overall Cognitive Status: Difficult to assess                 General Comments: pt became agitated during session and refused further participation. Pt was able to provide some short term history that was accurate    General Comments  Exercises     Assessment/Plan    PT Assessment Patient needs continued PT services  PT Problem List Decreased strength;Decreased mobility;Decreased balance;Decreased cognition;Decreased activity tolerance          PT Treatment Interventions Therapeutic exercise;Balance training;Functional mobility training;Therapeutic activities;Patient/family education;DME instruction    PT Goals (Current goals can be found in the Care Plan section)  Acute Rehab PT Goals Patient Stated Goal: per family, for pt to get stronger, mobilize, return home PT Goal Formulation: With  patient/family Time For Goal Achievement: 09/15/16 Potential to Achieve Goals: Good    Frequency Min 3X/week   Barriers to discharge        Co-evaluation               End of Session   Activity Tolerance: Treatment limited secondary to agitation Patient left: in bed;with call bell/phone within reach;with family/visitor present           Time: 0762-2633 PT Time Calculation (min) (ACUTE ONLY): 28 min   Charges:   PT Evaluation $PT Eval Low Complexity: 1 Procedure PT Treatments $Therapeutic Activity: 8-22 mins   PT G Codes:        Weston Anna, MPT Pager: [phone]

## 2016-09-01 NOTE — Progress Notes (Addendum)
Per patient request for right arm to be measured to monitor swelling. Forearm measures 27.5 cm, wrist 18 cm, circumference of hand about 23 cm. Pt asked to measure again in the morning. Right arm placed on 2 pillows to elevate. Arm is warm, pt doesn't report any tenderness to touch. Will continue to monitor closely.  09/02/16 0600-- arm measurement unchanged this am.

## 2016-09-01 NOTE — Progress Notes (Signed)
Joice for Warfarin Indication: Hx DVT  No Known Allergies  Patient Measurements: Height: '5\' 1"'$  (154.9 cm) Weight: 96 lb 5.5 oz (43.7 kg) IBW/kg (Calculated) : 47.8  Vital Signs: Temp: 98.4 F (36.9 C) (01/01 0447) Temp Source: Oral (01/01 0447) BP: 133/68 (01/01 0447) Pulse Rate: 72 (01/01 0447)  Labs:  Recent Labs  08/29/16 2357  08/30/16 0315  08/30/16 1120 08/31/16 0907 09/01/16 0535  HGB  --   --  11.6*  --   --  10.9* 10.5*  HCT  --   --  33.0*  --   --  31.6* 30.9*  PLT  --   --  69*  --   --  46* 45*  LABPROT 18.4*  --   --   --   --  19.4* 20.9*  INR 1.51  --   --   --   --  1.62 1.78  CREATININE  --   < >  --   < > 2.98* 2.65* 2.65*  < > = values in this interval not displayed.  Estimated Creatinine Clearance: 12.1 mL/min (by C-G formula based on SCr of 2.65 mg/dL (H)).   Assessment: 81 yr female on warfarin for h/o bilateral lower DVT's; PMH multiple myeloma. Admitted for sepsis, pharmacy to resume warfarin while admitted.   PTA warfarin regimen = 2.'5mg'$  daily except '5mg'$  on MWF  Last dose taken on 12/26 when INR = 4.3 and pt instructed by PCP to hold warfarin  Goal of Therapy:  INR 2-3  Today:  INR remains therapeutic today  Hgb/Plt both dropped acutely since admission; plt now 45k Myelosuppressive chemo received 12/26 but patient received this regimen for > 12 months starting in 2016 and never had Plt < 100k. Likely combination of chemo and acute illness/sepsis  Potential drug interactions with warfarin:  Levofloxacin (can increase INR response)  Dexamethasone (can increase INR response)  Regular diet ordered, eating 50% of meals  Plan:   Discussed with [facility] MD and will hold warfarin for low platelets.  No acute need for Vit K at this time  Daily INR, f/u CBC closely  Reuel Boom, PharmD, BCPS Pager: [phone] 09/01/2016, 10:54 AM

## 2016-09-01 NOTE — Progress Notes (Signed)
Pharmacy Antibiotic Note  [name] is a 81 y.o. female admitted on 08/29/2016 with PNA.  Pharmacy has been consulted for vancomycin/Zosyn dosing.  Plan:  Zosyn 2.25 gr IV q8h   Vancomycin 1000 mg IV (given on 12/30) x1, then vancomycin 500 mg IV q48hr starting 1/1  Follow renal function, cultures, clinical course as available  Height: '5\' 1"'$  (154.9 cm) Weight: 96 lb 5.5 oz (43.7 kg) IBW/kg (Calculated) : 47.8  Temp (24hrs), Avg:98.4 F (36.9 C), Min:98.4 F (36.9 C), Max:98.4 F (36.9 C)   Recent Labs Lab 08/26/16 1131  08/29/16 2313 08/29/16 2314 08/30/16 0302 08/30/16 0305 08/30/16 0315 08/30/16 0641 08/30/16 1120 08/31/16 0907 09/01/16 0535 09/01/16 1058  WBC 7.4  --  3.0*  --   --   --  2.7*  --   --  3.2* 2.4*  --   CREATININE  --   < > 3.47*  --  2.88*  --   --  3.05* 2.98* 2.65* 2.65*  --   LATICACIDVEN  --   --   --  2.61*  --  1.70  --   --   --   --   --  1.3  < > = values in this interval not displayed.  Estimated Creatinine Clearance: 12.1 mL/min (by C-G formula based on SCr of 2.65 mg/dL (H)).    No Known Allergies  Antimicrobials this admission: 12/29 zosyn >>  12/30, 1/1 >> 12/30 vanc >> 12/31, 1/1 >> 12/31 Levofloxacin >> 1/1  Dose adjustments this admission: ---  Microbiology results: 12/29 BCx: ngtd 12/29 UCx: mult sp, suggest recollect 12/30 MRSA PCR: neg  Thank you for allowing pharmacy to be a part of this patient's care.   Royetta Asal, PharmD, BCPS Pager [phone] 09/01/2016 4:51 PM

## 2016-09-01 NOTE — Progress Notes (Signed)
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  Patient found to be persistently hypoglycemic. Following several amps of D50 and then continue D10 fluids, CBGs started to stabilize. Patient began to wake up and become more alert. She was also noted on admission to have left upper extremity swelling, induration, tenderness and mild erythema in her forearm. This was thought to possibly be cellulitis versus venous thrombosis and patient started on antibiotics.  Her INR was noted to be subtherapeutic. It had been elevated prior to admission on 12/26 at 4.3 and she was likely told to hold this medication for several days.  Upper extremity Doppler ruled out DVT. Hypoglycemia resolved and patient able to be transferred to medical floor on 12/30. Also in the emergency room, chest x-ray noted possible mass but on CT confirmed the possible pneumonia. She's also noted to have a plasmacytoma lesion infiltrating rib.  Patient initially improved on 12/31, renal function improving and patient more alert with decreased right arm swelling. However by 1/1, a little more confused with a drop in platelets, increase in Procan calcitonin level and little change in her renal function. Patient's right forearm look to have more erythema and swelling and tenderness as well. Patient herself with no complaints although now she is more confused.  Assessment/Plan: Principal Problem:   Hypoglycemia causing acute metabolic encephalopathy secondary to sulfonylurea: Secondary to decreased clearance brought on by worsening renal failure. Medication discontinued. Patient initially improved  Acute  encephalopathy: Initially resolved, caused by hypoglycemia. Now return, may be secondary to worsening infection.  Active Problems:   Multiple myeloma ([place]): Oncology notified of admission.    Essential hypertension: Continue home medications   Hypokalemia: Replacing.   Chronic anticoagulation: INR subtherapeutic. Ruled out upper extremity thrombus. Coumadin per pharmacy, however now on hold due to thrombocytopenia.    Metabolic acidosis, normal anion gap (NAG)    Cellulitis of right forearm/swelling: Negative ultrasound. Initially had change antibiotics to cover both cellulitis and pneumonia. Increase improved calcitonin and overall decrease in clinical status, have stopped Levaquin and started broad-spectrum antibiotics. Noted increase in Procan calcitonin level. Checking lactic acid level.    Acute kidney injury in the setting of Myeloma kidney (HCC)/chronic kidney disease, stage III: Continue hydration.     Hypomagnesemia: Replacing. Pathologic rib fracture/ mass:? Plasmacytoma. We'll discuss with oncology  ? Pneumonia: Family reports an increased cough. We'll go ahead and check pro-calcitonin level.  Noted elevated, so we'll switch antibiotic coverage to Levaquin to cover both cellulitis and pneumonia.  Thrombocytopenia: Prior to admission at 175. Has been dropping since and today down to 45. Discussed with pharmacy. Have stopped Coumadin for now and started SCDs.  Lactic acidosis: On admission. Do not think this is sepsis, elevated level secondary to dehydration. Rechecking lactic acid level  Code Status: Full code   Family Communication:One of her daughters  at the bedside   Disposition Plan: Given worsening clinical picture, do not anticipate discharge at least for the next few days.    Consultants:  Oncology notified of admission   Procedures:  Right upper extremity ultrasound done 12/30: No evidence of DVT  Antimicrobials:  IV Zosyn and vancomycin 12/29-present    DVT prophylaxis:  Coumadin per pharmacy   Objective: Vitals:   08/31/16 0417 08/31/16 1441 08/31/16 2150 09/01/16 0447  BP: (!) 146/73 (!) 118/56 137/61 133/68  Pulse: 74 75 66 72  Resp: '14 15 16 16  '$ Temp: 99.1 F (37.3 C) 99.1 F (37.3 C) 98.4 F (36.9 C) 98.4 F (36.9 C)  TempSrc: Oral Oral Oral Oral  SpO2: 100% 98% 94% 98%  Weight:      Height:        Intake/Output Summary (Last 24 hours) at 09/01/16 1106 Last data filed at 09/01/16 0600  Gross per 24 hour  Intake          2146.25 ml  Output                0 ml  Net          2146.25 ml   Filed Weights   08/30/16 0800  Weight: 43.7 kg (96 lb 5.5 oz)    Exam:   General:  Alert and oriented 1, No acute distress   Cardiovascular: Regular rate and rhythm, S1-S2   Respiratory: Clear to auscultation bilaterally   Abdomen: Soft, nontender, nondistended, positive bowel sounds   Musculoskeletal: Right upper extremity more swollen, tender and erythematous on forearm than yesterday   Skin: As above, otherwise no skin breaks, tears or lesions Psychiatry: Patientconfused, no evidence of acute psychoses  Data Reviewed: CBC:  Recent Labs Lab 08/26/16 1131 08/29/16 2313 08/30/16 0315 08/31/16 0907 09/01/16 0535  WBC 7.4 3.0* 2.7* 3.2* 2.4*  NEUTROABS 5.4 2.6  --   --   --   HGB 6.5* 15.0 11.6* 10.9* 10.5*  HCT 19.4* 42.6 33.0* 31.6* 30.9*  MCV 82.1 82.7 80.3 84.0 85.8  PLT 175 66* 69* 46* 45*   Basic Metabolic Panel:  Recent Labs Lab 08/30/16 0302 08/30/16 0641 08/30/16 1120 08/31/16 0907 09/01/16 0535  NA 140 140 139 139 141  K 4.2 4.0 3.4* 3.0* 2.9*  CL 109 111 106 98* 95*  CO2 17* 19* 20* 28 32  GLUCOSE 49* 72 129* 193* 141*  BUN 59* 60* 55* 48* 38*  CREATININE 2.88* 3.05* 2.98* 2.65* 2.65*  CALCIUM 7.9* 8.1* 8.0* 7.6* 7.4*  MG 1.3*  --   --   --   --    GFR: Estimated Creatinine Clearance: 12.1 mL/min (by C-G formula based on SCr of 2.65 mg/dL (H)). Liver Function Tests:  Recent  Labs Lab 08/26/16 1137 08/29/16 2313  AST 144* 382*  ALT 16 29  ALKPHOS 101 77  BILITOT 0.25 <0.1*  PROT 7.2 6.5  ALBUMIN 3.4* 3.3*    Recent Labs Lab 08/29/16 2313  LIPASE 95*   No results for input(s): AMMONIA in the last 168 hours. Coagulation Profile:  Recent Labs Lab 08/26/16 1131 08/29/16 2357 08/31/16 0907 09/01/16 0535  INR 4.30* 1.51 1.62 1.78  PROTIME 51.6*  --   --   --    Cardiac Enzymes: No results for input(s): CKTOTAL, CKMB, CKMBINDEX, TROPONINI in the last 168 hours. BNP (last 3 results) No results for input(s): PROBNP in the last 8760 hours. HbA1C: No results for input(s): HGBA1C in the last 72 hours. CBG:  Recent Labs Lab 08/31/16 1122 08/31/16 1619 09/01/16 0052 09/01/16 0501 09/01/16 0758  GLUCAP 161* 144* 139* 141* 149*   Lipid Profile: No results for input(s): CHOL, HDL, LDLCALC, TRIG, CHOLHDL, LDLDIRECT in the last 72 hours. Thyroid Function Tests: No results for input(s): TSH, T4TOTAL, FREET4, T3FREE, THYROIDAB in the last 72 hours.  Anemia Panel: No results for input(s): VITAMINB12, FOLATE, FERRITIN, TIBC, IRON, RETICCTPCT in the last 72 hours. Urine analysis:    Component Value Date/Time   COLORURINE STRAW (A) 08/29/2016 2335   APPEARANCEUR CLEAR 08/29/2016 2335   LABSPEC 1.011 08/29/2016 2335   LABSPEC 1.020 10/19/2013 1546   PHURINE 6.0 08/29/2016 2335   GLUCOSEU 50 (A) 08/29/2016 2335   GLUCOSEU 1,000 10/19/2013 1546   HGBUR SMALL (A) 08/29/2016 2335   BILIRUBINUR NEGATIVE 08/29/2016 2335   BILIRUBINUR negative 05/18/2015 1545   BILIRUBINUR Negative 10/19/2013 [place] 08/29/2016 2335   PROTEINUR 30 (A) 08/29/2016 2335   UROBILINOGEN negative 05/18/2015 1545   UROBILINOGEN 0.2 03/10/2014 0007   UROBILINOGEN 0.2 10/19/2013 1546   NITRITE NEGATIVE 08/29/2016 2335   LEUKOCYTESUR NEGATIVE 08/29/2016 2335   LEUKOCYTESUR Negative 10/19/2013 1546   Sepsis  Labs: '@LABRCNTIP'$ (procalcitonin:4,lacticidven:4)  ) Recent Results (from the past 240 hour(s))  Blood Culture (routine x 2)     Status: None (Preliminary result)   Collection Time: 08/29/16 11:13 PM  Result Value Ref Range Status   Specimen Description BLOOD LEFT HAND  Final   Special Requests IN PEDIATRIC BOTTLE 1ML  Final   Culture   Final    NO GROWTH 1 DAY Performed at [facility]    Report Status PENDING  Incomplete  Blood Culture (routine x 2)     Status: None (Preliminary result)   Collection Time: 08/29/16 11:16 PM  Result Value Ref Range Status   Specimen Description BLOOD LEFT ANTECUBITAL  Final   Special Requests IN PEDIATRIC BOTTLE 1ML  Final   Culture   Final    NO GROWTH 1 DAY Performed at [facility]    Report Status PENDING  Incomplete  Urine culture     Status: Abnormal   Collection Time: 08/29/16 11:35 PM  Result Value Ref Range Status   Specimen Description URINE, CLEAN CATCH  Final   Special Requests NONE  Final   Culture MULTIPLE SPECIES PRESENT, SUGGEST RECOLLECTION (A)  Final   Report Status 08/31/2016 FINAL  Final  MRSA PCR Screening     Status: None   Collection Time: 08/30/16  4:08 AM  Result Value Ref Range Status   MRSA by PCR NEGATIVE NEGATIVE Final    Comment:        The GeneXpert MRSA Assay (FDA approved for NASAL specimens only), is one component of a comprehensive MRSA colonization surveillance program. It is not intended to diagnose MRSA infection nor to guide or monitor treatment for MRSA infections.       Studies: No results found.  Scheduled Meds: . amLODipine  5 mg Oral Daily  . dexamethasone  20 mg Oral Q Mon  . pantoprazole  40 mg Oral Daily  . potassium chloride  40 mEq Oral BID  . [START ON 09/02/2016] potassium chloride  40 mEq Oral Daily  . Warfarin - Pharmacist Dosing Inpatient   Does not apply q1800    Continuous Infusions: .  sodium bicarbonate 150 mEq in sterile water 1000 mL infusion 75 mL/hr  at 09/01/16 1021     LOS: 2 days     Annita Brod, MD [facility] Pager [phone]  If 7PM-7AM, please contact night-coverage [email or web address] Password [email or web address] 09/01/2016, 11:06 AM

## 2016-09-02 ENCOUNTER — Other Ambulatory Visit: Payer: Medicare Other

## 2016-09-02 ENCOUNTER — Inpatient Hospital Stay (HOSPITAL_COMMUNITY): Payer: Medicare Other

## 2016-09-02 ENCOUNTER — Other Ambulatory Visit: Payer: Self-pay | Admitting: Medical Oncology

## 2016-09-02 ENCOUNTER — Encounter: Payer: Self-pay | Admitting: Internal Medicine

## 2016-09-02 ENCOUNTER — Ambulatory Visit: Payer: Medicare Other

## 2016-09-02 ENCOUNTER — Telehealth: Payer: Self-pay | Admitting: Family Medicine

## 2016-09-02 DIAGNOSIS — C9002 Multiple myeloma in relapse: Secondary | ICD-10-CM

## 2016-09-02 LAB — BASIC METABOLIC PANEL
Anion gap: 16 — ABNORMAL HIGH (ref 5–15)
BUN: 32 mg/dL — AB (ref 6–20)
CHLORIDE: 93 mmol/L — AB (ref 101–111)
CO2: 34 mmol/L — AB (ref 22–32)
CREATININE: 2.35 mg/dL — AB (ref 0.44–1.00)
Calcium: 7.1 mg/dL — ABNORMAL LOW (ref 8.9–10.3)
GFR calc Af Amer: 22 mL/min — ABNORMAL LOW (ref >60)
GFR calc non Af Amer: 19 mL/min — ABNORMAL LOW (ref >60)
Glucose, Bld: 202 mg/dL — ABNORMAL HIGH (ref 65–99)
Potassium: 3.1 mmol/L — ABNORMAL LOW (ref 3.5–5.1)
Sodium: 143 mmol/L (ref 135–145)

## 2016-09-02 LAB — GLUCOSE, CAPILLARY
GLUCOSE-CAPILLARY: 192 mg/dL — AB (ref 65–99)
GLUCOSE-CAPILLARY: 203 mg/dL — AB (ref 65–99)
GLUCOSE-CAPILLARY: 220 mg/dL — AB (ref 65–99)
Glucose-Capillary: 167 mg/dL — ABNORMAL HIGH (ref 65–99)
Glucose-Capillary: 244 mg/dL — ABNORMAL HIGH (ref 65–99)

## 2016-09-02 LAB — CBC
HEMATOCRIT: 31.1 % — AB (ref 36.0–46.0)
HEMOGLOBIN: 10.4 g/dL — AB (ref 12.0–15.0)
MCH: 29.1 pg (ref 26.0–34.0)
MCHC: 33.4 g/dL (ref 30.0–36.0)
MCV: 87.1 fL (ref 78.0–100.0)
Platelets: 47 10*3/uL — ABNORMAL LOW (ref 150–400)
RBC: 3.57 MIL/uL — ABNORMAL LOW (ref 3.87–5.11)
RDW: 16.1 % — ABNORMAL HIGH (ref 11.5–15.5)
WBC: 2.2 10*3/uL — ABNORMAL LOW (ref 4.0–10.5)

## 2016-09-02 LAB — C DIFFICILE QUICK SCREEN W PCR REFLEX
C DIFFICLE (CDIFF) ANTIGEN: NEGATIVE
C Diff interpretation: NOT DETECTED
C Diff toxin: NEGATIVE

## 2016-09-02 LAB — PROTIME-INR
INR: 1.88
PROTHROMBIN TIME: 21.9 s — AB (ref 11.4–15.2)

## 2016-09-02 LAB — LACTIC ACID, PLASMA: LACTIC ACID, VENOUS: 1.7 mmol/L (ref 0.5–1.9)

## 2016-09-02 LAB — TROPONIN I: TROPONIN I: 0.04 ng/mL — AB (ref <0.03)

## 2016-09-02 MED ORDER — POTASSIUM CHLORIDE IN NACL 40-0.9 MEQ/L-% IV SOLN
INTRAVENOUS | Status: DC
  Administered 2016-09-02 – 2016-09-04 (×5): 75 mL/h via INTRAVENOUS
  Filled 2016-09-02 (×6): qty 1000

## 2016-09-02 MED ORDER — ADULT MULTIVITAMIN W/MINERALS CH
1.0000 | ORAL_TABLET | Freq: Every day | ORAL | Status: DC
  Filled 2016-09-02 (×2): qty 1

## 2016-09-02 MED ORDER — BOOST / RESOURCE BREEZE PO LIQD
1.0000 | Freq: Three times a day (TID) | ORAL | Status: DC
  Administered 2016-09-02 (×2): 1 via ORAL

## 2016-09-02 NOTE — Progress Notes (Signed)
PT Cancellation Note  Patient Details Name: [name] MRN: [id number] DOB: [birth year]   Cancelled Treatment:    Reason Eval/Treat Not Completed: Medical issues which prohibited therapy. Spoke with RN who recommended PT be held on today. Will check back another day. Thanks.    Weston Anna, MPT Pager: [phone]

## 2016-09-02 NOTE — Telephone Encounter (Signed)
I saw the inpatient records- they had notified me and I'll await the inpatient team's input.  I will be thinking about all of them.  Thanks.

## 2016-09-02 NOTE — Progress Notes (Addendum)
Initial Nutrition Assessment  DOCUMENTATION CODES:   Severe malnutrition in context of acute illness/injury  INTERVENTION:  - Will order Boost Breeze po TID, each supplement provides 250 kcal and 9 grams of protein - Will order daily multivitamin with minerals. - Consider trial of appetite stimulant (Marinol or Megace). - Continue to encourage PO intakes of meals, supplements, and beverages. - RD will continue to monitor for additional nutrition-related needs.  NUTRITION DIAGNOSIS:   Inadequate oral intake related to acute illness, poor appetite as evidenced by per patient/family report.   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  Patient found to be persistently hypoglycemic. Following several amps of D50 and then continue D10 fluids, CBGs started to stabilize. Patient began to wake up and become more alert. She was also noted on admission to have left upper extremity swelling, induration, tenderness and mild erythema in her forearm. This was thought to possibly be cellulitis versus venous thrombosis and patient started on antibiotics.  Pt seen for consult. BMI WDL when adjusted for L BKA. Per chart review, pt consumed 50% of all meals on 12/31. Yesterday she ate 10% of breakfast and 5% of lunch. Lunch tray from today was on bedside table and untouched at time of RD visit. Pt was in and out of sleep throughout visit and son was at bedside. Pt denies abdominal pain or nausea. She was grimacing when RD had entered the room and for a few  minutes after but denied pain or discomfort anywhere. Several liquids on bedside table; offered these to pt and she accepted a sip of water.   Pt denies chewing issues, but states that she does have some difficulty with swallowing with feeling that items get caught in her throat. She states this only happens with solid foods and not with liquids. She states it is worse with some foods than others but is unable to recall what these problems foods may be.   Son states that pt has continued to eat and drink poorly and that she was severely hypoglycemic on admission d/t this. He states that pt was recently restarted on chemo for multiple myeloma and that she had 1 treatment so far and was to have second treatment today.   Physical assessment limited d/t pt being in and out of wakefulness. Findings of mild to moderate muscle and mild to moderate fat wasting to upper body. Mild edema to RUE. Per chart review, pt has lost 4 lbs (4% body weight) in the past 2 weeks which is significant for time frame. Will order Boost Breeze as pt likes juice.   Py had been seen by this RD on 08/19/16 and information from this encounter bullet pointed below.  Medications reviewed; 40 mg oral Protonix/day, 40 mEq oral KCL BID, PRN oral Compazine.  Labs reviewed; CBGs: 167-203 mg/dL, K: 3.1 mmol/L, Cl: 93 mmol/L, BUN: 34 mg/dL, creatinine: 2.3 mg/dL, Ca: 7.1 mg/dL, GFR: 22 mL/min.  IVF: NS-40 mEq KCl @ 75 mL/hr.     - Pt was sleeping soundly at time of RD visit and  all information was provided by her daughter, who is at bedside.  - She was not present for breakfast but states that pt ate ~50% of lunch (whipped potatoes, [place], green beans, sliced peaches, and 2 glasses of orange juice) which provided ~230 kcal and 12 grams of protein.  - Pt has had a decreased appetite for several weeks and would mainly eat a few bites at meal times and may have a small snack during the day.  - Pt's daughter states that pt had L BKA done  in January or February 2017 and that she has been off of chemo for multiple myeloma since that time.  - Daughter reports that pt was to re-start chemo yesterday but was unable d/t severity of hypercalcemia.  - She states pt does not like Ensure or Boost.  - Unable to perform physical assessment at this time but will attempt at follow-up.  - Daughter states that pt has lost weight but she is unsure of the amount of weight lost.  - Per chart review, pt has lost 5 lbs (4.8% body weight) in the past 2 months which is not significant for time frame.   Diet Order:  Diet regular Room service appropriate? Yes; Fluid consistency: Thin  Skin:   WDL  Last BM:  1/1  Height:   Ht Readings from Last 1 Encounters:  08/30/16 '5\' 1"'$  (1.549 m)    Weight:   Wt Readings from Last 1 Encounters:  08/30/16 96 lb 5.5 oz (43.7 kg)    Ideal Body Weight:  47.73 kg  BMI:  Body mass index is 18.2 kg/m.  Estimated Nutritional Needs:   Kcal:  1440-1615 (33-37 kcal/kg)  Protein:  61-70 grams (1.4-1.6 grams/kg)  Fluid:  >/= 1.5 L/day  EDUCATION NEEDS:   No education needs identified at this time    Jarome Matin, MS, RD, LDN, CNSC Inpatient Clinical Dietitian Pager # [phone] After hours/weekend pager # [phone]

## 2016-09-02 NOTE — Telephone Encounter (Signed)
Daughter advised.

## 2016-09-02 NOTE — Progress Notes (Signed)
CRITICAL VALUE ALERT  Critical value received: Troponin 0.04  Date of notification:  09/02/16  Time of notification:  9191  Critical value read back:Yes.    Nurse who received alert:  Willene Hatchet, RN  MD notified (1st page):  Lyman Speller  Time of first page:  1558  MD notified (2nd page):  Time of second page:  Responding MD:  Lyman Speller  Time MD responded:  1612- No new orders

## 2016-09-02 NOTE — Progress Notes (Signed)
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  Patient found to be persistently hypoglycemic. Following several amps of D50 and then continue D10 fluids, CBGs started to stabilize. Patient began to wake up and become more alert. She was also noted on admission to have left upper extremity swelling, induration, tenderness and mild erythema in her forearm. This was thought to possibly be cellulitis versus venous thrombosis and patient started on antibiotics. She is also found to be in acute kidney injury with a creatinine of 3.7 (baseline looked to be around 1.4, but as of 12/5, baseline increased to around 2.3)  Her INR was noted to be subtherapeutic. It had been elevated prior to admission on 12/26 at 4.3 and she was likely told to hold this medication for several days. Also in the emergency room, chest x-ray noted possible mass but on CT confirmed the possible pneumonia. She's also noted to have a plasmacytoma lesion infiltrating rib.  Upper extremity Doppler ruled out DVT. Hypoglycemia resolved and patient able to be transferred to medical floor on 12/30.  Patient initially improved on 12/31, renal function improving and patient more alert with decreased right arm swelling. However by 1/1, more confused with drop in platelets and elevated pro calcitonin level. Antibiotics changed to broad spectrum. Coumadin on hold because of platelets.  This morning, patient continues to remain confused. Her creatinine continues to improve down to 2.35 (baseline appears to be around 2).  CT scan of the head checked and unremarkable. Patient herself denies any pain or shortness of  breath.  Her blood sugars continue to remain stable or elevated and has not been low since admission.  Assessment/Plan: Principal Problem:   Hypoglycemia causing acute metabolic encephalopathy secondary to sulfonylurea: Secondary to decreased clearance brought on by worsening renal failure. Medication discontinued. Patient initially improved  Acute encephalopathy: Initially resolved, caused by hypoglycemia. Now return, may be secondary to worsening infection. Have discontinued Ultram in case this could be a contributing factor. Updated one of her daughters today who stated that sometimes she does get this way when she is in the hospital, so? Underlying dementia and sundowning  Active Problems:   Multiple myeloma ([place]): Oncology notified of admission.    Essential hypertension: Continue home medications   Hypokalemia: Replacing.   Chronic anticoagulation: INR subtherapeutic. Ruled out upper extremity thrombus. Coumadin per pharmacy, however now on hold due to thrombocytopenia.    Metabolic acidosis, normal anion gap (NAG)    Cellulitis of right forearm/swelling: Negative ultrasound. Initially had change antibiotics to cover both cellulitis and pneumonia. Increase improved calcitonin and overall decrease in clinical status, stopped Levaquin and started broad-spectrum antibiotics. Noted increase in pro calcitonin level. Lactic acid level normal.    Acute kidney injury in the setting of Myeloma kidney (HCC)/chronic kidney disease, stage III: Continue hydration. Much closer to baseline    Hypomagnesemia: Replacing. Pathologic rib fracture/ mass:? Plasmacytoma. We'll discuss with oncology  ? Pneumonia: Family reports an increased cough. Will have speech therapy check swallow evaluation.  Thrombocytopenia: Prior to admission at 175. Has been dropping since and today down to 45. Discussed with pharmacy. Have stopped Coumadin for now and started SCDs. Felt to be secondary to  infection and  chemotherapy  Lactic acidosis: On admission. Do not think this is sepsis, elevated level secondary to dehydration. Rechecking lactic acid level  Code Status: Full code   Family Communication: Updated daughter by phone  Disposition Plan: Given worsening clinical picture, anticipate she will be here at least until Thursday or Friday   Consultants:  Oncology notified of admission   Procedures:  Right upper extremity ultrasound done 12/30: No evidence of DVT  Antimicrobials:  IV Zosyn and vancomycin 12/29-12/30, 1/1-present   Levaquin 12/30-1/1  DVT prophylaxis: Coumadin on hold. Currently on SCDs   Objective: Vitals:   09/01/16 1635 09/01/16 2030 09/02/16 0442 09/02/16 1158  BP: (!) 173/76 137/77 (!) 171/75 136/74  Pulse:  73 75 67  Resp: '18 20 18 18  '$ Temp: 99.4 F (37.4 C) 98.5 F (36.9 C) 98.5 F (36.9 C) 99.4 F (37.4 C)  TempSrc: Oral Oral Oral Oral  SpO2: 100% 99% 98% 100%  Weight:      Height:        Intake/Output Summary (Last 24 hours) at 09/02/16 1334 Last data filed at 09/02/16 0600  Gross per 24 hour  Intake             2480 ml  Output                0 ml  Net             2480 ml   Filed Weights   08/30/16 0800  Weight: 43.7 kg (96 lb 5.5 oz)    Exam:   General:  Alert and oriented 1, No acute distress   Cardiovascular: Regular rate and rhythm, S1-S2   Respiratory: Clear to auscultation bilaterally   Abdomen: Soft, nontender, nondistended, positive bowel sounds   Musculoskeletal: swelling, tenderness and erythematous of right forearm  Skin: As above, otherwise no skin breaks, tears or lesions Psychiatry: Patientconfused, no evidence of acute psychoses  Data Reviewed: CBC:  Recent Labs Lab 08/29/16 2313 08/30/16 0315 08/31/16 0907 09/01/16 0535 09/02/16 0507  WBC 3.0* 2.7* 3.2* 2.4* 2.2*  NEUTROABS 2.6  --   --   --   --   HGB 15.0 11.6* 10.9* 10.5* 10.4*  HCT 42.6 33.0* 31.6* 30.9* 31.1*  MCV 82.7 80.3 84.0 85.8 87.1    PLT 66* 69* 46* 45* 47*   Basic Metabolic Panel:  Recent Labs Lab 08/30/16 0302 08/30/16 0641 08/30/16 1120 08/31/16 0907 09/01/16 0535 09/02/16 0507  NA 140 140 139 139 141 143  K 4.2 4.0 3.4* 3.0* 2.9* 3.1*  CL 109 111 106 98* 95* 93*  CO2 17* 19* 20* 28 32 34*  GLUCOSE 49* 72 129* 193* 141* 202*  BUN 59* 60* 55* 48* 38* 32*  CREATININE 2.88* 3.05* 2.98* 2.65* 2.65* 2.35*  CALCIUM 7.9* 8.1* 8.0* 7.6* 7.4* 7.1*  MG 1.3*  --   --   --   --   --    GFR: Estimated Creatinine Clearance: 13.6 mL/min (by C-G formula based on SCr of 2.35 mg/dL (H)). Liver Function Tests:  Recent Labs Lab 08/29/16 2313  AST 382*  ALT 29  ALKPHOS 77  BILITOT <0.1*  PROT 6.5  ALBUMIN 3.3*    Recent Labs Lab 08/29/16 2313  LIPASE 95*   No results for input(s): AMMONIA in the last 168 hours. Coagulation Profile:  Recent Labs Lab 08/29/16 2357 08/31/16 0907 09/01/16 0535 09/02/16 0507  INR 1.51 1.62 1.78 1.88   Cardiac Enzymes: No results for input(s):  CKTOTAL, CKMB, CKMBINDEX, TROPONINI in the last 168 hours. BNP (last 3 results) No results for input(s): PROBNP in the last 8760 hours. HbA1C: No results for input(s): HGBA1C in the last 72 hours. CBG:  Recent Labs Lab 09/01/16 1958 09/01/16 2304 09/02/16 0321 09/02/16 0720 09/02/16 1157  GLUCAP 234* 188* 192* 167* 203*   Lipid Profile: No results for input(s): CHOL, HDL, LDLCALC, TRIG, CHOLHDL, LDLDIRECT in the last 72 hours. Thyroid Function Tests: No results for input(s): TSH, T4TOTAL, FREET4, T3FREE, THYROIDAB in the last 72 hours. Anemia Panel: No results for input(s): VITAMINB12, FOLATE, FERRITIN, TIBC, IRON, RETICCTPCT in the last 72 hours. Urine analysis:    Component Value Date/Time   COLORURINE STRAW (A) 08/29/2016 2335   APPEARANCEUR CLEAR 08/29/2016 2335   LABSPEC 1.011 08/29/2016 2335   LABSPEC 1.020 10/19/2013 1546   PHURINE 6.0 08/29/2016 2335   GLUCOSEU 50 (A) 08/29/2016 2335   GLUCOSEU 1,000  10/19/2013 1546   HGBUR SMALL (A) 08/29/2016 2335   BILIRUBINUR NEGATIVE 08/29/2016 2335   BILIRUBINUR negative 05/18/2015 1545   BILIRUBINUR Negative 10/19/2013 [place] 08/29/2016 2335   PROTEINUR 30 (A) 08/29/2016 2335   UROBILINOGEN negative 05/18/2015 1545   UROBILINOGEN 0.2 03/10/2014 0007   UROBILINOGEN 0.2 10/19/2013 1546   NITRITE NEGATIVE 08/29/2016 2335   LEUKOCYTESUR NEGATIVE 08/29/2016 2335   LEUKOCYTESUR Negative 10/19/2013 1546   Sepsis Labs: '@LABRCNTIP'$ (procalcitonin:4,lacticidven:4)  ) Recent Results (from the past 240 hour(s))  Blood Culture (routine x 2)     Status: None (Preliminary result)   Collection Time: 08/29/16 11:13 PM  Result Value Ref Range Status   Specimen Description BLOOD LEFT HAND  Final   Special Requests IN PEDIATRIC BOTTLE 1ML  Final   Culture   Final    NO GROWTH 2 DAYS Performed at [facility]    Report Status PENDING  Incomplete  Blood Culture (routine x 2)     Status: None (Preliminary result)   Collection Time: 08/29/16 11:16 PM  Result Value Ref Range Status   Specimen Description BLOOD LEFT ANTECUBITAL  Final   Special Requests IN PEDIATRIC BOTTLE 1ML  Final   Culture   Final    NO GROWTH 2 DAYS Performed at [facility]    Report Status PENDING  Incomplete  Urine culture     Status: Abnormal   Collection Time: 08/29/16 11:35 PM  Result Value Ref Range Status   Specimen Description URINE, CLEAN CATCH  Final   Special Requests NONE  Final   Culture MULTIPLE SPECIES PRESENT, SUGGEST RECOLLECTION (A)  Final   Report Status 08/31/2016 FINAL  Final  MRSA PCR Screening     Status: None   Collection Time: 08/30/16  4:08 AM  Result Value Ref Range Status   MRSA by PCR NEGATIVE NEGATIVE Final    Comment:        The GeneXpert MRSA Assay (FDA approved for NASAL specimens only), is one component of a comprehensive MRSA colonization surveillance program. It is not intended to diagnose  MRSA infection nor to guide or monitor treatment for MRSA infections.       Studies: Ct Head Wo Contrast  Result Date: 09/02/2016 CLINICAL DATA:  81 year old female with a history of multiple myeloma. Confusion EXAM: CT HEAD WITHOUT CONTRAST TECHNIQUE: Contiguous axial images were obtained from the base of the skull through the vertex without intravenous contrast. COMPARISON:  07/30/2016, 09/10/2015, 09/08/2013, 05/03/2011 FINDINGS: Brain: No acute intracranial hemorrhage. No midline shift or mass effect.  Similar appearance of the ventricular system. Diffuse brain volume loss is unchanged. Intracranial atherosclerotic calcifications. Vascular: Calcifications of the intracranial vasculature Skull: Multiple small lytic lesions throughout the calvarium, which do not appear significantly changed compared to the most recent CT studies. Overall the appearance is relatively similar dating to the CT 05/03/2011. No displaced fracture. Motion artifact limits evaluation of the skullbase. Sinuses/Orbits: No acute finding. Other: None. IMPRESSION: No CT evidence of acute intracranial abnormality. Similar appearance of brain volume loss. Intracranial atherosclerosis. Signed, Dulcy Fanny. Earleen Newport, DO [facility] Electronically Signed   By: Corrie Mckusick D.O.   On: 09/02/2016 12:40    Scheduled Meds: . amLODipine  5 mg Oral Daily  . dexamethasone  20 mg Oral Q Mon  . pantoprazole  40 mg Oral Daily  . piperacillin-tazobactam (ZOSYN)  IV  2.25 g Intravenous Q8H  . vancomycin  500 mg Intravenous Q48H  . Warfarin - Pharmacist Dosing Inpatient   Does not apply q1800    Continuous Infusions: . 0.9 % NaCl with KCl 40 mEq / L       LOS: 3 days     Annita Brod, MD [facility] Pager [phone]  If 7PM-7AM, please contact night-coverage [email or web address] Password [email or web address] 09/02/2016, 1:34 PM

## 2016-09-02 NOTE — Telephone Encounter (Signed)
Daughter called to let us know that pt is in [place] [facility]

## 2016-09-02 NOTE — Progress Notes (Signed)
[place] for Warfarin Indication: Hx DVT  No Known Allergies  Patient Measurements: Height: _0  (154.9 cm) Weight: 96 lb 5.5 oz (43.7 kg) IBW/kg (Calculated) : 47.8  Vital Signs: Temp: 98.5 F (36.9 C) (01/02 0442) Temp Source: Oral (01/02 0442) BP: 171/75 (01/02 0442) Pulse Rate: 75 (01/02 0442)  Labs:  Recent Labs  08/31/16 0907 09/01/16 0535 09/02/16 0507  HGB 10.9* 10.5* 10.4*  HCT 31.6* 30.9* 31.1*  PLT 46* 45* 47*  LABPROT 19.4* 20.9* 21.9*  INR 1.62 1.78 1.88  CREATININE 2.65* 2.65* 2.35*   Estimated Creatinine Clearance: 13.6 mL/min (by C-G formula based on SCr of 2.35 mg/dL (H)).  Assessment: 81 yr female on warfarin for h/o bilateral lower DVT's; PMH multiple myeloma. Admitted for sepsis, pharmacy to resume warfarin while admitted.   PTA warfarin regimen = 2.6m daily except 547mon MWF  Last dose taken on 12/26 when INR = 4.3 and pt instructed by PCP to hold warfarin  Goal of Therapy:  INR 2-3  Today:  INR remains sub- therapeutic, but increased despite no Warfarin 1/1  Plt 47 today,   Hgb/Plt both dropped acutely since admission; Myelosuppressive chemo received 12/26 but patient received this regimen for > 12 months starting in 2016 and never had Plt < 100k. Likely combination of chemo and acute illness/sepsis  Potential drug interactions with warfarin:  Levofloxacin (can increase INR response) 12/31 > 1/1  Dexamethasone (can increase INR response)  Regular diet ordered, decreased intake 1/1  Plan:   Discussed with [facility] MD 1/1, held warfarin for low platelets.  Continue to hold Warfarin  Daily INR, f/u CBC closely  GrMinda DittoharmD Pager [phone]/10/2016, 10:50 AM

## 2016-09-03 ENCOUNTER — Ambulatory Visit: Payer: Medicare Other

## 2016-09-03 ENCOUNTER — Telehealth: Payer: Self-pay | Admitting: Family Medicine

## 2016-09-03 DIAGNOSIS — F05 Delirium due to known physiological condition: Secondary | ICD-10-CM | POA: Diagnosis present

## 2016-09-03 DIAGNOSIS — F0391 Unspecified dementia with behavioral disturbance: Secondary | ICD-10-CM

## 2016-09-03 DIAGNOSIS — F03918 Unspecified dementia, unspecified severity, with other behavioral disturbance: Secondary | ICD-10-CM | POA: Diagnosis present

## 2016-09-03 DIAGNOSIS — E161 Other hypoglycemia: Secondary | ICD-10-CM

## 2016-09-03 DIAGNOSIS — E43 Unspecified severe protein-calorie malnutrition: Secondary | ICD-10-CM

## 2016-09-03 DIAGNOSIS — C9002 Multiple myeloma in relapse: Secondary | ICD-10-CM

## 2016-09-03 LAB — CBC
HCT: 30 % — ABNORMAL LOW (ref 36.0–46.0)
HEMOGLOBIN: 10 g/dL — AB (ref 12.0–15.0)
MCH: 28.7 pg (ref 26.0–34.0)
MCHC: 33.3 g/dL (ref 30.0–36.0)
MCV: 86.2 fL (ref 78.0–100.0)
PLATELETS: 40 10*3/uL — AB (ref 150–400)
RBC: 3.48 MIL/uL — AB (ref 3.87–5.11)
RDW: 16 % — ABNORMAL HIGH (ref 11.5–15.5)
WBC: 4.3 10*3/uL (ref 4.0–10.5)

## 2016-09-03 LAB — GLUCOSE, CAPILLARY
GLUCOSE-CAPILLARY: 122 mg/dL — AB (ref 65–99)
GLUCOSE-CAPILLARY: 147 mg/dL — AB (ref 65–99)
GLUCOSE-CAPILLARY: 175 mg/dL — AB (ref 65–99)
GLUCOSE-CAPILLARY: 181 mg/dL — AB (ref 65–99)
GLUCOSE-CAPILLARY: 197 mg/dL — AB (ref 65–99)
Glucose-Capillary: 178 mg/dL — ABNORMAL HIGH (ref 65–99)

## 2016-09-03 LAB — PROTIME-INR
INR: 2.11
Prothrombin Time: 23.9 seconds — ABNORMAL HIGH (ref 11.4–15.2)

## 2016-09-03 LAB — BASIC METABOLIC PANEL
ANION GAP: 12 (ref 5–15)
BUN: 28 mg/dL — ABNORMAL HIGH (ref 6–20)
CO2: 29 mmol/L (ref 22–32)
Calcium: 7.1 mg/dL — ABNORMAL LOW (ref 8.9–10.3)
Chloride: 105 mmol/L (ref 101–111)
Creatinine, Ser: 2.07 mg/dL — ABNORMAL HIGH (ref 0.44–1.00)
GFR calc Af Amer: 25 mL/min — ABNORMAL LOW (ref >60)
GFR, EST NON AFRICAN AMERICAN: 22 mL/min — AB (ref >60)
Glucose, Bld: 178 mg/dL — ABNORMAL HIGH (ref 65–99)
POTASSIUM: 3.5 mmol/L (ref 3.5–5.1)
SODIUM: 146 mmol/L — AB (ref 135–145)

## 2016-09-03 LAB — PROCALCITONIN: Procalcitonin: 0.54 ng/mL

## 2016-09-03 MED ORDER — LORAZEPAM 2 MG/ML IJ SOLN
0.5000 mg | Freq: Once | INTRAMUSCULAR | Status: AC
  Administered 2016-09-03: 0.5 mg via INTRAVENOUS
  Filled 2016-09-03: qty 1

## 2016-09-03 MED ORDER — LORAZEPAM 2 MG/ML IJ SOLN
0.5000 mg | Freq: Every day | INTRAMUSCULAR | Status: AC
  Administered 2016-09-03: 0.5 mg via INTRAVENOUS
  Filled 2016-09-03: qty 1

## 2016-09-03 MED ORDER — LORAZEPAM 2 MG/ML IJ SOLN
0.2500 mg | Freq: Once | INTRAMUSCULAR | Status: AC
  Administered 2016-09-03: 0.25 mg via INTRAVENOUS
  Filled 2016-09-03: qty 1

## 2016-09-03 NOTE — Telephone Encounter (Signed)
[name] called to cancel appointment tomorrow pt in hospital @ [place] [facility] She has ?  About pt care.  The hospital is wanting to send in palliative care and she has question

## 2016-09-03 NOTE — Care Management Important Message (Signed)
Important Message  Patient Details  Name: [name] MRN: [id number] Date of Birth: Nov 08, 1935   Medicare Important Message Given:  Yes    Kerin Salen 09/03/2016, 11:53 AMImportant Message  Patient Details  Name: [name] MRN: [id number] Date of Birth: 1935-12-17   Medicare Important Message Given:  Yes    Kerin Salen 09/03/2016, 11:53 AM

## 2016-09-03 NOTE — Telephone Encounter (Signed)
Patient's daughter notified as instructed by telephone and verbalized understanding. [name] stated that this is all that she needs to know. [name] stated that she just wanted to make sure they were going in the right direction. [name] stated that she does not have any further questions. Thompson Caul if she has any further questions to let Dr. [name] know.

## 2016-09-03 NOTE — Telephone Encounter (Signed)
Please see that the question is.  I have confidence in the palliative care team (I used to be on it back in the day) and they usually start by asking what the patient's goals are, etc.  They are kind and helpful.   Thanks.

## 2016-09-03 NOTE — Progress Notes (Signed)
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  Patient found to be persistently hypoglycemic. Following several amps of D50 and then continue D10 fluids, CBGs started to stabilize. Patient began to wake up and become more alert. She was also noted on admission to have left upper extremity swelling, induration, tenderness and mild erythema in her forearm. This was thought to possibly be cellulitis versus venous thrombosis and patient started on antibiotics. She is also found to be in acute kidney injury with a creatinine of 3.7 (baseline looked to be around 1.4, but as of 12/5, baseline increased to around 2.3)  Her INR was noted to be subtherapeutic. It had been elevated prior to admission on 12/26 at 4.3 and she was likely told to hold this medication for several days. Also in the emergency room, chest x-ray noted possible mass but on CT confirmed the possible pneumonia. She's also noted to have a plasmacytoma lesion infiltrating rib.  Upper extremity Doppler ruled out DVT. Hypoglycemia resolved and patient able to be transferred to medical floor on 12/30.  Patient initially improved on 12/31, renal function improving and patient more alert with decreased right arm swelling. However by 1/1, more confused with drop in platelets and elevated pro calcitonin level. Antibiotics changed to broad spectrum. Coumadin on hold because of platelets.  Since 1/1, patient continues to be confused. Renal function at this point back to baseline. CT scan of head negative. In discussion with her family, she is normally on Xanax 0.25 mg with a dose in the morning, dyspnea afternoon and 0.5  mg at night and this is given scheduled. This is different than what has been appearing on her MAR. This morning, patient is quite agitated and very tearful and anxious. She is spitting out medications and refuses to take them. Her daughter also confirmed that when she is in the hospital but, she becomes quite confused usually after day 2.  Assessment/Plan: Principal Problem:   Hypoglycemia causing acute metabolic encephalopathy secondary to sulfonylurea: Secondary to decreased clearance brought on by worsening renal failure. Medication discontinued. Patient initially improved  Acute encephalopathy in the setting of senile dementia with behavioral disturbance and anxiety state: Initially resolved, caused by hypoglycemia.  This recurrence is now likely from a combination of sundowning from dementia with also anxiety brought on by not getting her Xanax consistently and possibly even mild benzo withdrawal. Since she is refusing take medications, have started on some IV Ativan with a dose given this morning which has calmed her down and a dose this afternoon and a dose tonight. If she tolerates this and is more calm, then we'll be able to start her back on by mouth Xanax starting tomorrow. Continue to treat this with IV until she is calm.  Active Problems:   Multiple myeloma ([place]): Oncology notified of admission.  Note still pending from this morning's visit, but patient's daughter stated that oncologist told her that she would not be a candidate for further chemotherapy    Essential hypertension: Continue home medications   Hypokalemia: Replacing.   Chronic anticoagulation: INR subtherapeutic. Ruled out upper extremity thrombus. Coumadin per pharmacy, however now on hold due to thrombocytopenia.    Metabolic  acidosis, normal anion gap (NAG)    Cellulitis of right forearm/swelling: Negative ultrasound. Initially had changed antibiotics to cover both cellulitis and pneumonia. Initially pro calcitonin on  12/30 noted improvement, but then started to trend upwards on 1/1. Antibiotics changed back to broad spectrum and today's pro-calcitonin much improved.    Acute kidney injury in the setting of Myeloma kidney (HCC)/chronic kidney disease, stage III: Continue hydration. Today at 2.07, near or at patient's baseline    Hypomagnesemia: Replacing. Pathologic rib fracture/ mass:? Plasmacytoma. Awaiting their note, but reports are that she would not be a candidate for chemotherapy  Community-acquired Pneumonia: Family reports an increased cough. Once more alert, we'll check swallow evaluation. Stable for now.  Severe protein calorie malnutrition: Patient meets criteria in the context of acute illness/injury. Seen by nutrition. On daily multivitamin plus boost breeze 3 times a day. If her appetite does not improve, we'll likely start Marinol in the next 24 hours.  Thrombocytopenia: Prior to admission at 175. Has been dropping since and still staying in the 40s. Discussed with pharmacy. Have stopped Coumadin for now and started SCDs. Felt to be secondary to infection and chemotherapy  Lactic acidosis: On admission. Do not think this is sepsis, elevated level secondary to dehydration. Recheck at normal levels  Code Status: Full code   Family Communication: Updated daughter at the bedside  Disposition Plan: She is overall medically improving in regards to her infection and renal function area this acute sundowning and agitation will likely take several more days to improve   Consultants:  Oncology notified of admission   Procedures:  Right upper extremity ultrasound done 12/30: No evidence of DVT  Antimicrobials:  IV Zosyn and vancomycin 12/29-12/30, 1/1-present   Levaquin 12/30-1/1  DVT prophylaxis: Coumadin on hold. Currently on SCDs   Objective: Vitals:   09/02/16 1158 09/02/16 1559 09/02/16 2154 09/03/16 0949  BP: 136/74 116/73 (!) 150/66 (!) 150/62  Pulse: 67 71 76   Resp: '18 18  18   '$ Temp: 99.4 F (37.4 C) 98.2 F (36.8 C) 98.2 F (36.8 C)   TempSrc: Oral Oral Axillary   SpO2: 100% 100% 100%   Weight:      Height:        Intake/Output Summary (Last 24 hours) at 09/03/16 1404 Last data filed at 09/03/16 0900  Gross per 24 hour  Intake          1663.75 ml  Output                0 ml  Net          1663.75 ml   Filed Weights   08/30/16 0800  Weight: 43.7 kg (96 lb 5.5 oz)    Exam:   General:  Alert and oriented 1, Extremely anxious, tearful  Cardiovascular: Regular rate and rhythm, S1-S2   Respiratory: Clear to auscultation bilaterally   Abdomen: Soft, nontender, nondistended, positive bowel sounds   Musculoskeletal: swelling, tenderness and erythematous of right forearm, slight improvement from previous day  Skin: As above, otherwise no skin breaks, tears or lesions Psychiatry: Patientconfused, anxious  Data Reviewed: CBC:  Recent Labs Lab 08/29/16 2313 08/30/16 0315 08/31/16 0907 09/01/16 0535 09/02/16 0507 09/03/16 0552  WBC 3.0* 2.7* 3.2* 2.4* 2.2* 4.3  NEUTROABS 2.6  --   --   --   --   --   HGB 15.0 11.6* 10.9* 10.5* 10.4* 10.0*  HCT 42.6 33.0* 31.6* 30.9* 31.1* 30.0*  MCV 82.7 80.3 84.0 85.8 87.1  86.2  PLT 66* 69* 46* 45* 47* 40*   Basic Metabolic Panel:  Recent Labs Lab 08/30/16 0302  08/30/16 1120 08/31/16 0907 09/01/16 0535 09/02/16 0507 09/03/16 0552  NA 140  < > 139 139 141 143 146*  K 4.2  < > 3.4* 3.0* 2.9* 3.1* 3.5  CL 109  < > 106 98* 95* 93* 105  CO2 17*  < > 20* 28 32 34* 29  GLUCOSE 49*  < > 129* 193* 141* 202* 178*  BUN 59*  < > 55* 48* 38* 32* 28*  CREATININE 2.88*  < > 2.98* 2.65* 2.65* 2.35* 2.07*  CALCIUM 7.9*  < > 8.0* 7.6* 7.4* 7.1* 7.1*  MG 1.3*  --   --   --   --   --   --   < > = values in this interval not displayed. GFR: Estimated Creatinine Clearance: 15.5 mL/min (by C-G formula based on SCr of 2.07 mg/dL (H)). Liver Function Tests:  Recent Labs Lab 08/29/16 2313  AST 382*  ALT  29  ALKPHOS 77  BILITOT <0.1*  PROT 6.5  ALBUMIN 3.3*    Recent Labs Lab 08/29/16 2313  LIPASE 95*   No results for input(s): AMMONIA in the last 168 hours. Coagulation Profile:  Recent Labs Lab 08/29/16 2357 08/31/16 0907 09/01/16 0535 09/02/16 0507 09/03/16 0552  INR 1.51 1.62 1.78 1.88 2.11   Cardiac Enzymes:  Recent Labs Lab 09/02/16 1426  TROPONINI 0.04*   BNP (last 3 results) No results for input(s): PROBNP in the last 8760 hours. HbA1C: No results for input(s): HGBA1C in the last 72 hours. CBG:  Recent Labs Lab 09/02/16 2058 09/03/16 0002 09/03/16 0503 09/03/16 0734 09/03/16 1154  GLUCAP 220* 197* 181* 175* 178*   Lipid Profile: No results for input(s): CHOL, HDL, LDLCALC, TRIG, CHOLHDL, LDLDIRECT in the last 72 hours. Thyroid Function Tests: No results for input(s): TSH, T4TOTAL, FREET4, T3FREE, THYROIDAB in the last 72 hours. Anemia Panel: No results for input(s): VITAMINB12, FOLATE, FERRITIN, TIBC, IRON, RETICCTPCT in the last 72 hours. Urine analysis:    Component Value Date/Time   COLORURINE STRAW (A) 08/29/2016 2335   APPEARANCEUR CLEAR 08/29/2016 2335   LABSPEC 1.011 08/29/2016 2335   LABSPEC 1.020 10/19/2013 1546   PHURINE 6.0 08/29/2016 2335   GLUCOSEU 50 (A) 08/29/2016 2335   GLUCOSEU 1,000 10/19/2013 1546   HGBUR SMALL (A) 08/29/2016 2335   BILIRUBINUR NEGATIVE 08/29/2016 2335   BILIRUBINUR negative 05/18/2015 1545   BILIRUBINUR Negative 10/19/2013 [place] 08/29/2016 2335   PROTEINUR 30 (A) 08/29/2016 2335   UROBILINOGEN negative 05/18/2015 1545   UROBILINOGEN 0.2 03/10/2014 0007   UROBILINOGEN 0.2 10/19/2013 1546   NITRITE NEGATIVE 08/29/2016 2335   LEUKOCYTESUR NEGATIVE 08/29/2016 2335   LEUKOCYTESUR Negative 10/19/2013 1546   Sepsis Labs: '@LABRCNTIP'$ (procalcitonin:4,lacticidven:4)  ) Recent Results (from the past 240 hour(s))  Blood Culture (routine x 2)     Status: None (Preliminary result)    Collection Time: 08/29/16 11:13 PM  Result Value Ref Range Status   Specimen Description BLOOD LEFT HAND  Final   Special Requests IN PEDIATRIC BOTTLE 1ML  Final   Culture   Final    NO GROWTH 3 DAYS Performed at [facility]    Report Status PENDING  Incomplete  Blood Culture (routine x 2)     Status: None (Preliminary result)   Collection Time: 08/29/16 11:16 PM  Result Value Ref Range Status   Specimen Description BLOOD  LEFT ANTECUBITAL  Final   Special Requests IN PEDIATRIC BOTTLE 1ML  Final   Culture   Final    NO GROWTH 3 DAYS Performed at [facility]    Report Status PENDING  Incomplete  Urine culture     Status: Abnormal   Collection Time: 08/29/16 11:35 PM  Result Value Ref Range Status   Specimen Description URINE, CLEAN CATCH  Final   Special Requests NONE  Final   Culture MULTIPLE SPECIES PRESENT, SUGGEST RECOLLECTION (A)  Final   Report Status 08/31/2016 FINAL  Final  MRSA PCR Screening     Status: None   Collection Time: 08/30/16  4:08 AM  Result Value Ref Range Status   MRSA by PCR NEGATIVE NEGATIVE Final    Comment:        The GeneXpert MRSA Assay (FDA approved for NASAL specimens only), is one component of a comprehensive MRSA colonization surveillance program. It is not intended to diagnose MRSA infection nor to guide or monitor treatment for MRSA infections.   C difficile quick scan w PCR reflex     Status: None   Collection Time: 09/01/16  3:56 PM  Result Value Ref Range Status   C Diff antigen NEGATIVE NEGATIVE Final   C Diff toxin NEGATIVE NEGATIVE Final   C Diff interpretation No C. difficile detected.  Final      Studies: No results found.  Scheduled Meds: . amLODipine  5 mg Oral Daily  . dexamethasone  20 mg Oral Q Mon  . feeding supplement  1 Container Oral TID BM  . LORazepam  0.5 mg Intravenous Once  . LORazepam  0.5 mg Intravenous QHS  . multivitamin with minerals  1 tablet Oral Daily  . pantoprazole  40 mg Oral  Daily  . piperacillin-tazobactam (ZOSYN)  IV  2.25 g Intravenous Q8H  . vancomycin  500 mg Intravenous Q48H  . Warfarin - Pharmacist Dosing Inpatient   Does not apply q1800    Continuous Infusions: . 0.9 % NaCl with KCl 40 mEq / L 75 mL/hr (09/03/16 0336)     LOS: 4 days     Annita Brod, MD [facility] Pager [phone]  If 7PM-7AM, please contact night-coverage [email or web address] Password [email or web address] 09/03/2016, 2:04 PM

## 2016-09-03 NOTE — Plan of Care (Signed)
Problem: Nutrition: Goal: Adequate nutrition will be maintained Outcome: Progressing Pt met with dietician and started on boost.

## 2016-09-03 NOTE — Progress Notes (Signed)
C diff PCR negative, precautions removed.

## 2016-09-03 NOTE — Progress Notes (Signed)
DIAGNOSIS:  1) multiple myeloma diagnosed in September 2009. 2) history of bilateral lower extremity deep venous thrombosis in November 2009 as well as October 2010. 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   PRIOR THERAPY: 1. status post palliative radiotherapy to the right femur and ischail tuberosity, first was done in December 2011 and the second treatment was completed Jan 08 2011, and the third treatment to the right distal femur completed on 04/30/2011  2. status post palliative radiotherapy to the left proximal femur completed 02/28/2011.  3. Revlimid 25 mg by mouth daily for 21 days every 4 weeks in addition to oral Decadron at 40 mg by mouth on a weekly basis status post 40 cycles.  4. weekly subcutaneous Velcade 1.3 mg/M2 status post 60 cycles, discontinued today secondary to mild disease progression. 5. Pomalyst 4 mg by mouth daily for 21 days every 4 weeks, the patient started the first dose on 12/22/2012 , in addition to Decadron 40 mg weekly. Status post 13 cycles.  6. Chemotherapy according to the BMS [id number] expanded access treatment protocol with Elotuzumab, lenalidomide and dexamthasone. Status post 5 cycles, discontinued today secondary to disease progression.  7. Systemic chemotherapy with Carfilzomib, Cytoxan and Decadron. First cycle 09/05/2014. Status post 12 cycles. 8. Left below knee amputation for gangrene of the left foot on 10/09/2015 under the care of Dr. Edilia Bo.  CURRENT THERAPY: 1. Systemic chemotherapy again with Carfilzomib, Cytoxan and Decadron. She was treated in the past was 13 cycles. She will resume cycle #15 on 08/18/2016 2. Coumadin 3. Zometa 4 mg IV every 3 months.  Subjective: The patient is seen and examined today. Her daughter [name] was at the bedside. The patient was admitted recently to the hospital with persistent hypoglycemia and unresponsiveness. She was treated for her  hypoglycemia and blood glucose is much better. Over the last few weeks her condition has been deteriorating rapidly with significant disease progression as well as hypercalcemia of malignancy and generalized weakness and fatigue. She was so confused today when I saw her and her daughter was the one who gave the history. She is currently being treated for pneumonia. She also has a swelling of the right upper extremity and ultrasound Doppler did not show any evidence of deep venous thrombosis in the right upper extremity. She has been on Coumadin for history of DVT and pulmonary embolism. The patient denied having any pain but mentions that she would like to go home. She has no fever or chills.  Objective: Vital signs in last 24 hours: Temp:  [98.2 F (36.8 C)-99.4 F (37.4 C)] 98.2 F (36.8 C) (01/02 2154) Pulse Rate:  [67-76] 76 (01/02 2154) Resp:  [18] 18 (01/02 2154) BP: (116-150)/(66-74) 150/66 (01/02 2154) SpO2:  [100 %] 100 % (01/02 2154)  Intake/Output from previous day: 01/02 0701 - 01/03 0700 In: 1663.8 [P.O.:300; I.V.:1213.8; IV Piggyback:150] Out: -  Intake/Output this shift: No intake/output data recorded.  General appearance: fatigued, no distress, slowed mentation and uncooperative Resp: clear to auscultation bilaterally Cardio: regular rate and rhythm, S1, S2 normal, no murmur, click, rub or gallop GI: soft, non-tender; bowel sounds normal; no masses,  no organomegaly Extremities: Left below knee amputation.  Lab Results:   Recent Labs  09/02/16 0507 09/03/16 0552  WBC 2.2* 4.3  HGB 10.4* 10.0*  HCT 31.1* 30.0*  PLT 47* 40*   BMET  Recent Labs  09/02/16 0507 09/03/16 0552  NA 143 146*  K 3.1* 3.5  CL 93* 105  CO2 34* 29  GLUCOSE 202* 178*  BUN 32* 28*  CREATININE 2.35* 2.07*  CALCIUM 7.1* 7.1*    Studies/Results: Ct Head Wo Contrast  Result Date: 09/02/2016 CLINICAL DATA:  81 year old female with a history of multiple myeloma. Confusion EXAM: CT  HEAD WITHOUT CONTRAST TECHNIQUE: Contiguous axial images were obtained from the base of the skull through the vertex without intravenous contrast. COMPARISON:  07/30/2016, 09/10/2015, 09/08/2013, 05/03/2011 FINDINGS: Brain: No acute intracranial hemorrhage. No midline shift or mass effect. Similar appearance of the ventricular system. Diffuse brain volume loss is unchanged. Intracranial atherosclerotic calcifications. Vascular: Calcifications of the intracranial vasculature Skull: Multiple small lytic lesions throughout the calvarium, which do not appear significantly changed compared to the most recent CT studies. Overall the appearance is relatively similar dating to the CT 05/03/2011. No displaced fracture. Motion artifact limits evaluation of the skullbase. Sinuses/Orbits: No acute finding. Other: None. IMPRESSION: No CT evidence of acute intracranial abnormality. Similar appearance of brain volume loss. Intracranial atherosclerosis. Signed, Dulcy Fanny. Earleen Newport, DO [facility] Radiology Electronically Signed   By: Corrie Mckusick D.O.   On: 09/02/2016 12:40    Medications: I have reviewed the patient's current medications.  Assessment/Plan: This is a very pleasant 81 years old African-American female with multiple medical problems including: 1) progressive and relapsed multiple myeloma: She was diagnosed with this condition in September 2009 and underwent several chemotherapy regimens. She has been off treatment for more than a year because of the below-knee amputation and frequent hospitalization as well as nursing home stays. She was recently started again on systemic chemotherapy with Carfilzomib, Cytoxan and Decadron status post 1 week of treatment. Her treatment is currently on hold secondary to recent hospitalization. Unfortunately her condition continues to deteriorate rapidly and the patient may be not be a good candidate to resume her systemic  therapy. I had a lengthy discussion with the patient and her daughter today about her current condition. I discussed with him the goals of care and strongly recommended for them to consider palliative care and hospice at this point. The daughter is in agreement with the current plan. We will consult the palliative care team. 2) hypoglycemia: Secondary to recent infection as well as adrenal insufficiency from previous steroid treatment. This is resolved at this point. 3) acute encephalopathy: Multifactorial including her multiple medical problems as well as polypharmacy. 4) right upper extremity swelling: Etiology is unclear could be secondary to cellulitis. Doppler of the upper extremity shows no evidence for deep venous thrombosis.  5) renal insufficiency: Secondary to progression of the multiple myeloma. 6) hypercalcemia of malignancy: Better. The patient was treated with medronate as well as Zometa recently. Thank you so much for taking good care of Ms. [name], I will continue to follow up the patient with you and assist in her management an as-needed basis.  LOS: 4 days    Tomie Elko K. 09/03/2016

## 2016-09-03 NOTE — Progress Notes (Signed)
PT Cancellation Note  Patient Details Name: [name] MRN: [id number] DOB: 01/23/1936   Cancelled Treatment:    Reason Eval/Treat Not Completed: Patient's level of consciousness; pt has been very upset and anxious, just had Ativan--defer at this time per RN   [facility] 09/03/2016, 11:36 AM

## 2016-09-03 NOTE — Consult Note (Signed)
   [facility] CM Inpatient Consult   09/03/2016  FORTUNE TOROSIAN December 28, 1935 [id number]    Patient screened for [facility] program. Spoke with inpatient [facility] prior to engaging patient/family for potential [facility] Care Management program services. Discussed that [facility] not appropriate at this time as Palliative Medicine consult is pending.    Marthenia Rolling, MSN-Ed, RN,BSN [facility] Liaison [phone]

## 2016-09-03 NOTE — Progress Notes (Signed)
[place] for Warfarin Indication: Hx DVT  No Known Allergies  Patient Measurements: Height: '5\' 1"'$  (154.9 cm) Weight: 96 lb 5.5 oz (43.7 kg) IBW/kg (Calculated) : 47.8  Vital Signs: Temp: 98.2 F (36.8 C) (01/02 2154) Temp Source: Axillary (01/02 2154) BP: 150/66 (01/02 2154) Pulse Rate: 76 (01/02 2154)  Labs:  Recent Labs  09/01/16 0535 09/02/16 0507 09/02/16 1426 09/03/16 0552  HGB 10.5* 10.4*  --  10.0*  HCT 30.9* 31.1*  --  30.0*  PLT 45* 47*  --  40*  LABPROT 20.9* 21.9*  --  23.9*  INR 1.78 1.88  --  2.11  CREATININE 2.65* 2.35*  --  2.07*  TROPONINI  --   --  0.04*  --    Estimated Creatinine Clearance: 15.5 mL/min (by C-G formula based on SCr of 2.07 mg/dL (H)).  Assessment: 82 yr female on warfarin for h/o bilateral lower DVT's; PMH multiple myeloma. Admitted 12/29 for sepsis, pharmacy to resume warfarin while admitted.   PTA warfarin regimen = 2.'5mg'$  daily except '5mg'$  on MWF  Last dose PTA on 12/26 when INR = 4.3 and pt instructed by PCP to hold warfarin  Goal of Therapy:  INR 2-3  Today:  INR now increased into therapeutic range, despite last dose Warfarin 12/31  Plt 40 today,   Hgb/Plt both dropped acutely since admission; Myelosuppressive chemo received 12/26 but patient received this regimen for > 12 months starting in 2016 and never had Plt < 100k. Likely combination of chemo and acute illness/sepsis  Potential drug interactions with warfarin:  Levofloxacin (can increase INR response) 12/31 > 1/1  Dexamethasone (can increase INR response)  Regular diet ordered, decreased intake since 1/1  Plan:   Discussed with [facility] MD 1/1, held warfarin for low platelets.  Continue to hold Warfarin  Daily INR, f/u CBC closely  Minda Ditto PharmD Pager [phone] 09/03/2016, 7:22 AM

## 2016-09-04 ENCOUNTER — Ambulatory Visit: Payer: Medicare Other | Admitting: Family Medicine

## 2016-09-04 DIAGNOSIS — Z515 Encounter for palliative care: Secondary | ICD-10-CM

## 2016-09-04 DIAGNOSIS — Z7189 Other specified counseling: Secondary | ICD-10-CM

## 2016-09-04 LAB — BASIC METABOLIC PANEL
Anion gap: 9 (ref 5–15)
BUN: 25 mg/dL — AB (ref 6–20)
CALCIUM: 8 mg/dL — AB (ref 8.9–10.3)
CO2: 24 mmol/L (ref 22–32)
CREATININE: 2.4 mg/dL — AB (ref 0.44–1.00)
Chloride: 117 mmol/L — ABNORMAL HIGH (ref 101–111)
GFR calc non Af Amer: 18 mL/min — ABNORMAL LOW (ref >60)
GFR, EST AFRICAN AMERICAN: 21 mL/min — AB (ref >60)
GLUCOSE: 138 mg/dL — AB (ref 65–99)
Potassium: 5.3 mmol/L — ABNORMAL HIGH (ref 3.5–5.1)
Sodium: 150 mmol/L — ABNORMAL HIGH (ref 135–145)

## 2016-09-04 LAB — CBC
HCT: 33 % — ABNORMAL LOW (ref 36.0–46.0)
Hemoglobin: 10.3 g/dL — ABNORMAL LOW (ref 12.0–15.0)
MCH: 27.7 pg (ref 26.0–34.0)
MCHC: 31.2 g/dL (ref 30.0–36.0)
MCV: 88.7 fL (ref 78.0–100.0)
PLATELETS: 42 10*3/uL — AB (ref 150–400)
RBC: 3.72 MIL/uL — AB (ref 3.87–5.11)
RDW: 16.4 % — AB (ref 11.5–15.5)
WBC: 3.6 10*3/uL — ABNORMAL LOW (ref 4.0–10.5)

## 2016-09-04 LAB — CULTURE, BLOOD (ROUTINE X 2)
CULTURE: NO GROWTH
CULTURE: NO GROWTH

## 2016-09-04 LAB — GLUCOSE, CAPILLARY
GLUCOSE-CAPILLARY: 107 mg/dL — AB (ref 65–99)
Glucose-Capillary: 123 mg/dL — ABNORMAL HIGH (ref 65–99)
Glucose-Capillary: 114 mg/dL — ABNORMAL HIGH (ref 65–99)
Glucose-Capillary: 138 mg/dL — ABNORMAL HIGH (ref 65–99)

## 2016-09-04 LAB — PROTIME-INR
INR: 2.18
PROTHROMBIN TIME: 24.6 s — AB (ref 11.4–15.2)

## 2016-09-04 MED ORDER — LORAZEPAM 2 MG/ML IJ SOLN
0.5000 mg | INTRAMUSCULAR | Status: DC | PRN
  Administered 2016-09-04: 0.5 mg via INTRAVENOUS
  Administered 2016-09-04: 1 mg via INTRAVENOUS
  Filled 2016-09-04 (×2): qty 1

## 2016-09-04 MED ORDER — LORAZEPAM 2 MG/ML IJ SOLN
0.2500 mg | Freq: Once | INTRAMUSCULAR | Status: AC
  Administered 2016-09-04: 0.25 mg via INTRAVENOUS
  Filled 2016-09-04: qty 1

## 2016-09-04 MED ORDER — MORPHINE SULFATE (PF) 2 MG/ML IV SOLN
1.0000 mg | INTRAVENOUS | Status: DC | PRN
  Administered 2016-09-04 (×3): 1 mg via INTRAVENOUS
  Filled 2016-09-04 (×3): qty 1

## 2016-09-04 MED ORDER — MIRTAZAPINE 15 MG PO TBDP
7.5000 mg | ORAL_TABLET | Freq: Every evening | ORAL | Status: DC | PRN
  Administered 2016-09-04: 7.5 mg via ORAL
  Filled 2016-09-04 (×2): qty 0.5

## 2016-09-04 MED ORDER — ONDANSETRON HCL 4 MG/2ML IJ SOLN: 4.0000 mg | Freq: Four times a day (QID) | INTRAMUSCULAR | Status: DC | PRN

## 2016-09-04 MED ORDER — PANTOPRAZOLE SODIUM 40 MG IV SOLR
40.0000 mg | INTRAVENOUS | Status: DC
  Administered 2016-09-05: 40 mg via INTRAVENOUS
  Filled 2016-09-04: qty 40

## 2016-09-04 MED ORDER — SENNOSIDES 8.8 MG/5ML PO SYRP
5.0000 mL | ORAL_SOLUTION | Freq: Two times a day (BID) | ORAL | Status: DC
  Administered 2016-09-04 (×2): 5 mL via ORAL
  Filled 2016-09-04 (×3): qty 5

## 2016-09-04 NOTE — Care Management Note (Signed)
Case Management Note  Patient Details  Name: [name] MRN: [id number] Date of Birth: 01/24/1936  Subjective/Objective:  Noted palliative cons-await recc. CSW following for residential hospice if qualifies.                  Action/Plan:d/c plan residential hospice   Expected Discharge Date:                  Expected Discharge Plan:     In-House Referral:  Clinical Social Work  Discharge planning Services  CM Consult  Post Acute Care Choice:    Choice offered to:     DME Arranged:    DME Agency:     HH Arranged:    HH Agency:     Status of Service:  In process, will continue to follow  If discussed at Long Length of Stay Meetings, dates discussed:    Additional Comments:  Dessa Phi, RN 09/04/2016, 11:40 AM

## 2016-09-04 NOTE — Progress Notes (Signed)
CSW received consult for residential hospice placement. CSW confirmed with Judson Roch, PMT NP that patient/family expressed interest in [facility]. CSW confirmed with Harmon Pier that she is aware of referral - awaiting response re: bed availability/eligibility.    Raynaldo Opitz, [facility] Clinical Social Worker cell #: [phone]

## 2016-09-04 NOTE — Consult Note (Signed)
[facility] Liaison  Confirmed with CSW Claiborne Billings request for family interest in [facility]. Chart reviewed and met with three daughters and two sons after speaking with them at length by phone. During this meeting, answered questions, explained hospice philosophy of care and grief support services for children and adults, completed paper work and offered support. Dr. Tomasa Hosteller to assume care per family request. Daughter [name] completed paper work for transfer 09/04/2016. CSW [place] aware of above.   Please fax discharge summary to [phone].  RN please call report to [phone].  Thank you,  Lindsay Conte, LCSW [phone]  [facility] [place] hospital liaisons are now listed on AMION under Hospice and [place].

## 2016-09-04 NOTE — Progress Notes (Signed)
Attempted two separate times to insert foley catheter for patient per order.  2 separate RNs attempted but unsuccessful.  Patient was grimacing with discomfort during attempts.  Family stated that patient has had multiple difficulties with catheter placement in the past and this is not unusual for her.  Due to anatomy and difficulty, RN paged palliative who stated to hold off insertion at this time since patient is voiding on her own (incontinent).  Family informed.

## 2016-09-04 NOTE — Progress Notes (Signed)
Received word back from [place]on Pier at [facility] that there is no availability today, will check back tomorrow. CSW met with patient's daughter - [name] & sister - [name] at bedside to discuss backup facilities. Daughter was hesitant to agree to look into [facility], but understands that if there still isn't availability tomorrow we would have to look into other facilities. CSW will check back tomorrow with [facility] re: availability.    Raynaldo Opitz, [facility] Clinical Social Worker cell #: [phone]

## 2016-09-04 NOTE — Progress Notes (Signed)
Attempted to give PRN xanax. Pt spit pill out. Pt currently resting in bed. Will continue to monitor.

## 2016-09-04 NOTE — Consult Note (Signed)
Consultation Note Date: 09/04/2016   Patient Name: [name]  DOB: 1935-12-24  MRN: [id number]  Age / Sex: 81 y.o., female  PCP: Tonia Ghent, MD Referring Physician: Florencia Reasons, MD  Reason for Consultation: Establishing goals of care. Palliative asked to support family in goals of care clarification in the setting of progressive Multiple Myeloma that she not presently a candidate for treatment, and acute issues of CAP, AKI, cellulitis, and electrolyte abnormalities.  HPI/Patient Profile: 81 y.o. 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 Oncology consulted and discussed her progressive and relapsed MM with pt and her daughter. He explained that she is likely not a good candidate to resume systemic treatment given her ongoing deterioration.   Clinical Assessment and Goals of Care: I met with [name] at her bedside. She was too confused to meaningfully participate in conversations about her clinical status and goals of care. Her daughter, [name], was at her bedside. [name] is the primary care provider for her mother, with help from her other siblings (total seven children). I was also able to speak with another Daughter, [name], over the phone.   I introduced myself and explained the role of Palliative Care, as well as the difference between Palliative Care and Hospice. We discussed their understanding of their mother's health issues--both chronically and acutely-and their perception of how she is doing. Both  daughters expressed clear understanding that [name] is at the end of her life. [name] had spoken with her mother's Oncologist yesterday, and he explained that further systemic treatment for her progressive MM is not an option given her progressive deterioration and present functional status. He had encouraged end of life care, such as with Hospice. Both [name] and [name] were not surprised by his recommendation; they have noticed a progressive and rapid decline in their mother's health (furthered by her poor oral intake to the point that she is now not eating or drinking beyond a few sips of water occasionally).   I discussed Hospice in terms of their philosophy and approach to care. I also discussed residential versus home hospice. Since their mother is no longer eating or drinking, has known infection, high anxiety, and progressive pain, I recommended residential hospice as a means to provide adequate care. They agreed with that recommendation. [facility] called to evaluate for [facility], per the family's request.  I also facilitated a conference call to provide an update and enable all other interested children to have a chance to ask questions. They all support residential Hospice placement.  Primary Decision Maker NEXT OF KIN; pt's children cooperatively made decisions together.   SUMMARY OF RECOMMENDATIONS    DNR; consult placed for residential Hospice evaluation (I already called [facility] and SW aware)  Medication adjusted given pt's difficulty with oral medication and additional PRN medication prescribed for symptom management; family wants to continue antibiotics and fluids until discharge, they understand that Hospice is comfort focused and will not provide these things after discharge  Place foley for comfort  Code Status/Advance Care Planning:  DNR  Palliative Prophylaxis:   Bowel Regimen, Frequent Pain Assessment,  Oral Care and Turn Reposition  Additional Recommendations  (Limitations, Scope, Preferences):  Full Comfort Care; continue abx and fluids until discharge per family preference.  Psycho-social/Spiritual:   Desire for further Chaplaincy support:no  Additional Recommendations: Education on Hospice  Prognosis:   Hours - Days. I would expect <2 weeks. Pt has virtually no oral intake. She has a presumed infection (cellulitus), and electrolyte abnormalities that will likely worsen quickly.   Discharge Planning: Hospice facility      Primary Diagnoses: Present on Admission: . Hypoglycemia secondary to sulfonylurea . Metabolic acidosis, normal anion gap (NAG) . Cellulitis of right forearm . Myeloma kidney ([place]) . Multiple myeloma in relapse ([place]) . Essential hypertension . Hypomagnesemia . Hypokalemia . Acute encephalopathy . CKD (chronic kidney disease) stage 3, GFR 30-59 ml/min . Chest mass . AKI (acute kidney injury) ([place]) . Senile dementia with delirium with behavioral disturbance   I have reviewed the medical record, interviewed the patient and family, and examined the patient. The following aspects are pertinent.  Past Medical History:  Diagnosis Date  . Anemia   . Anxiety    takes Xanax daily as needed  . Blood transfusion   . Cardiomyopathy ([place])   . Deep vein thrombosis of bilateral lower extremities ([facility])   . Degenerative joint disease   . Depression    Mild  . Diabetes mellitus    takes Glipizide daily  . Family history of anesthesia complication    DAUGHTER CPR AFTER  . GERD (gastroesophageal reflux disease)    takes Protonix daily  . Heart murmur   . History of chicken pox   . History of radiation therapy 08/30/13-09/20/13   20 gray to lower lumbar/upper sacrum  . History of shingles   . Hyperlipidemia   . Hypertension    takes Amlodipine,HCTZ,and Lisinopril daily  . Multiple myeloma    per Dr. Julien Nordmann, s/p palliative radiation for leg pain 2011 per Dr. Sondra Come  . Phlebitis   . UTI (urinary tract infection)  09/08/2013   Social History   Social History  . Marital status: Widowed    Spouse name: N/A  . Number of children: N/A  . Years of education: N/A   Occupational History  . Retired Retired   Social History Main Topics  . Smoking status: Former Smoker    Packs/day: 1.00    Years: 13.00    Quit date: 09/01/1973  . Smokeless tobacco: Never Used     Comment: QIUT MANY YEARS AGO  . Alcohol use No  . Drug use: No  . Sexual activity: No   Other Topics Concern  . None   Social History Narrative   Regular exercise:  Yes   Family History  Problem Relation Age of Onset  . Arthritis Other     Parents  . Cancer Other     Colon, 1st degree relative <60  . Diabetes Other     Parents  . Stroke Other     1st degree relative <60  . Diabetes Mother   . Hypertension Mother   . Arthritis Mother   . Stroke Mother   . Diabetes Father   . Hypertension Father   . Arthritis Father   . Stroke Father   . Cancer Sister     stomach  . Stroke Brother   . Cancer Brother     prostate <50  . Stroke Daughter   . Stroke Son    Scheduled Meds: . amLODipine  5 mg Oral Daily  .  dexamethasone  20 mg Oral Q Mon  . feeding supplement  1 Container Oral TID BM  . multivitamin with minerals  1 tablet Oral Daily  . pantoprazole  40 mg Oral Daily  . piperacillin-tazobactam (ZOSYN)  IV  2.25 g Intravenous Q8H  . vancomycin  500 mg Intravenous Q48H  . Warfarin - Pharmacist Dosing Inpatient   Does not apply q1800   Continuous Infusions: . 0.9 % NaCl with KCl 40 mEq / L 75 mL/hr (09/04/16 0829)   PRN Meds:.acetaminophen, ALPRAZolam, diclofenac sodium, mirtazapine, prochlorperazine No Known Allergies   Review of Systems: Unable to obtain d/t confusion  Physical Exam  Constitutional: She appears listless. She appears cachectic.  HENT:  Head: Normocephalic and atraumatic.  Mouth/Throat: Oropharynx is clear and moist. Mucous membranes are dry.  Eyes: EOM are normal.  Neck: Normal range of  motion.  Cardiovascular: Normal rate.   Pulmonary/Chest: Effort normal.  Abdominal: Soft. Bowel sounds are normal. There is generalized tenderness. There is no rigidity and no guarding.  Musculoskeletal:  L BKA. RUE with edema and tenderness.   Neurological: She appears listless.  Tracks me with her eyes. Verbalizes responses to questions, but words are garbled and random.  Skin: Skin is warm and dry. There is pallor.  Psychiatric: Her mood appears anxious. She is agitated. She expresses impulsivity. She is noncommunicative.    Vital Signs: BP (!) 178/87 (BP Location: Left Arm)   Pulse 77   Temp 98.1 F (36.7 C) (Oral)   Resp 18   Ht '5\' 1"'$  (1.549 m)   Wt 43.7 kg (96 lb 5.5 oz)   SpO2 100%   BMI 18.20 kg/m  Pain Assessment: No/denies pain POSS *See Group Information*: S-Acceptable,Sleep, easy to arouse Pain Score: 0-No pain   SpO2: SpO2: 100 % O2 Device:SpO2: 100 % O2 Flow Rate: .O2 Flow Rate (L/min): 2 L/min  IO: Intake/output summary:  Intake/Output Summary (Last 24 hours) at 09/04/16 0906 Last data filed at 09/04/16 0857  Gross per 24 hour  Intake             2280 ml  Output                0 ml  Net             2280 ml    LBM: Last BM Date: 09/03/16 Baseline Weight: Weight: 43.7 kg (96 lb 5.5 oz) Most recent weight: Weight: 43.7 kg (96 lb 5.5 oz)     Palliative Assessment/Data: PPS 10%   Flowsheet Rows   Flowsheet Row Most Recent Value  Intake Tab  Referral Department  Hospitalist  Unit at Time of Referral  Cardiac/Telemetry Unit  Palliative Care Primary Diagnosis  Cancer  Date Notified  09/03/16  Palliative Care Type  New Palliative care  Reason for referral  Clarify Goals of Care  Date of Admission  08/29/16  # of days IP prior to Palliative referral  5  Clinical Assessment  Psychosocial & Spiritual Assessment  Palliative Care Outcomes       Time Total: 90 minutes Greater than 50%  of this time was spent counseling and coordinating care related to  the above assessment and plan.  Signed by: Charlynn Court, NP Palliative Medicine Team Pager # [phone] (M-F 7a-5p) Team Phone # [phone] (Nights/Weekends)

## 2016-09-04 NOTE — Progress Notes (Signed)
Pharmacy Antibiotic Note  [name] is a 81 y.o. female admitted on 08/29/2016 with PNA.  Pharmacy has been consulted for vancomycin/Zosyn dosing.  Scr increased 2.4, CrCl ~ 13.75ms/min  Plan:  Continue zosyn 2.'25mg'$  IV q8h  Continue  vancomycin 500 mg IV q48hr   Follow renal function, cultures, clinical course as available  Height: '5\' 1"'$  (154.9 cm) Weight: 96 lb 5.5 oz (43.7 kg) IBW/kg (Calculated) : 47.8  Temp (24hrs), Avg:98.9 F (37.2 C), Min:98.1 F (36.7 C), Max:99.6 F (37.6 C)   Recent Labs Lab 08/29/16 2314  08/30/16 0305  08/31/16 0907 09/01/16 0535 09/01/16 1058 09/02/16 0507 09/02/16 1426 09/03/16 0552 09/04/16 0602  WBC  --   --   --   < > 3.2* 2.4*  --  2.2*  --  4.3 3.6*  CREATININE  --   < >  --   < > 2.65* 2.65*  --  2.35*  --  2.07* 2.40*  LATICACIDVEN 2.61*  --  1.70  --   --   --  1.3  --  1.7  --   --   < > = values in this interval not displayed.  Estimated Creatinine Clearance: 13.3 mL/min (by C-G formula based on SCr of 2.4 mg/dL (H)).    No Known Allergies  Antimicrobials this admission: 12/29 zosyn >>  12/30, 1/1 >> 12/30 vanc >> 12/31, 1/1 >> 12/31 Levofloxacin >> 1/1  Dose adjustments this admission: ---  Microbiology results: 12/29 BCx: ngtd 12/29 UCx: mult sp, suggest recollect 12/30 MRSA PCR: neg  Thank you for allowing pharmacy to be a part of this patient's care.   EDolly RiasRPh 09/04/2016, 11:26 AM Pager [phone]

## 2016-09-04 NOTE — Progress Notes (Signed)
Pt became anxious again this am. On-call paged, order given for ativan IV at 0445. Pt currently resting in bed comfortably. Will continue to monitor closely.

## 2016-09-04 NOTE — Progress Notes (Signed)
PT Cancellation Note  Patient Details Name: [name] MRN: [id number] DOB: [birth year]   Cancelled Treatment:     pt resting comfortably.  Will check back another time as schedule permits.   Rica Koyanagi  PTA [facility]  Acute  Rehab Pager      [phone]

## 2016-09-04 NOTE — Progress Notes (Signed)
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  Patient found to be persistently hypoglycemic. Following several amps of D50 and then continue D10 fluids, CBGs started to stabilize. Patient began to wake up and become more alert. She was also noted on admission to have left upper extremity swelling, induration, tenderness and mild erythema in her forearm. This was thought to possibly be cellulitis versus venous thrombosis and patient started on antibiotics. She is also found to be in acute kidney injury with a creatinine of 3.7 (baseline looked to be around 1.4, but as of 12/5, baseline increased to around 2.3)  Her INR was noted to be subtherapeutic. It had been elevated prior to admission on 12/26 at 4.3 and she was likely told to hold this medication for several days. Also in the emergency room, chest x-ray noted possible mass but on CT confirmed the possible pneumonia. She's also noted to have a plasmacytoma lesion infiltrating rib.  Upper extremity Doppler ruled out DVT. Hypoglycemia resolved and patient able to be transferred to medical floor on 12/30.  Patient initially improved on 12/31, renal function improving and patient more alert with decreased right arm swelling. However by 1/1, more confused with drop in platelets and elevated pro calcitonin level. Antibiotics changed to broad spectrum. Coumadin on hold because of platelets.  Since 1/1, patient continues to be confused. Renal function at this point back to baseline. CT scan of head negative. In discussion with her family, she is normally on Xanax 0.25 mg with a dose in the morning, dyspnea afternoon and 0.5  mg at night and this is given scheduled. This is different than what has been appearing on her MAR. This morning, patient is quite agitated and very tearful and anxious. She is spitting out medications and refuses to take them. Her daughter also confirmed that when she is in the hospital but, she becomes quite confused usually after day 2.  Assessment/Plan: Principal Problem:   Hypoglycemia causing acute metabolic encephalopathy secondary to sulfonylurea: Secondary to decreased clearance brought on by worsening renal failure. Medication discontinued. Patient initially improved  Acute encephalopathy in the setting of senile dementia with behavioral disturbance and anxiety state: Initially resolved, caused by hypoglycemia.  This recurrence is now likely from a combination of sundowning from dementia with also anxiety brought on by not getting her Xanax consistently and possibly even mild benzo withdrawal. Since she is refusing take medications, have started on some IV Ativan with a dose given this morning which has calmed her down and a dose this afternoon and a dose tonight. If she tolerates this and is more calm, then we'll be able to start her back on by mouth Xanax starting tomorrow. Continue to treat this with IV until she is calm.  Active Problems:   Multiple myeloma ([place]): Oncology notified of admission.  Note still pending from this morning's visit, but patient's daughter stated that oncologist told her that she would not be a candidate for further chemotherapy    Essential hypertension: Continue home medications   Hypokalemia: Replacing.   Chronic anticoagulation: INR subtherapeutic. Ruled out upper extremity thrombus. Coumadin per pharmacy, however now on hold due to thrombocytopenia.    Metabolic  acidosis, normal anion gap (NAG)    Cellulitis of right forearm/swelling: Negative ultrasound. Initially had changed antibiotics to cover both cellulitis and pneumonia. Initially pro calcitonin on  12/30 noted improvement, but then started to trend upwards on 1/1. Antibiotics changed back to broad spectrum and today's pro-calcitonin much improved.    Acute kidney injury in the setting of Myeloma kidney (HCC)/chronic kidney disease, stage III: Continue hydration. Today at 2.07, near or at patient's baseline    Hypomagnesemia: Replacing. Pathologic rib fracture/ mass:? Plasmacytoma. Awaiting their note, but reports are that she would not be a candidate for chemotherapy  Community-acquired Pneumonia: Family reports an increased cough. Once more alert, we'll check swallow evaluation. Stable for now.  Severe protein calorie malnutrition: Patient meets criteria in the context of acute illness/injury. Seen by nutrition. On daily multivitamin plus boost breeze 3 times a day. If her appetite does not improve, we'll likely start Marinol in the next 24 hours.  Thrombocytopenia: Prior to admission at 175. Has been dropping since and still staying in the 40s. Discussed with pharmacy. Have stopped Coumadin for now and started SCDs. Felt to be secondary to infection and chemotherapy  Lactic acidosis: On admission. Do not think this is sepsis, elevated level secondary to dehydration. Recheck at normal levels  Code Status: DNR    Family Communication: Updated daughter at the bedside  Disposition Plan: started on full comfort measures on 1/4, d/c to residential facility on 1/5   Consultants:  Oncology notified of admission   Procedures:  Right upper extremity ultrasound done 12/30: No evidence of DVT  Antimicrobials:  IV Zosyn and vancomycin 12/29-12/30, 1/1-present   Levaquin 12/30-1/1  DVT prophylaxis: Coumadin on hold. Currently on SCDs   Objective: Vitals:   09/03/16 0949 09/03/16 1413 09/03/16 2115 09/04/16 0516  BP: (!) 150/62 107/60 (!) 114/59 (!) 178/87  Pulse:  77 72 77  Resp:  '18 18 18  '$ Temp:  99 F (37.2 C) 99.6 F (37.6 C) 98.1 F (36.7 C)  TempSrc:  Oral Axillary  Oral  SpO2:  100% 98% 100%  Weight:      Height:        Intake/Output Summary (Last 24 hours) at 09/04/16 1128 Last data filed at 09/04/16 0857  Gross per 24 hour  Intake             2280 ml  Output                0 ml  Net             2280 ml   Filed Weights   08/30/16 0800  Weight: 43.7 kg (96 lb 5.5 oz)    Exam:   General:  Alert and oriented 1, Extremely anxious, tearful  Cardiovascular: Regular rate and rhythm, S1-S2   Respiratory: Clear to auscultation bilaterally   Abdomen: Soft, nontender, nondistended, positive bowel sounds   Musculoskeletal: swelling, tenderness and erythematous of right forearm, slight improvement from previous day  Skin: As above, otherwise no skin breaks, tears or lesions Psychiatry: Patientconfused, anxious  Data Reviewed: CBC:  Recent Labs Lab 08/29/16 2313  08/31/16 0907 09/01/16 0535 09/02/16 0507 09/03/16 0552 09/04/16 0602  WBC 3.0*  < > 3.2* 2.4* 2.2* 4.3 3.6*  NEUTROABS 2.6  --   --   --   --   --   --   HGB 15.0  < > 10.9* 10.5* 10.4* 10.0* 10.3*  HCT 42.6  < > 31.6* 30.9* 31.1* 30.0* 33.0*  MCV 82.7  < > 84.0 85.8 87.1 86.2 88.7  PLT 66*  < > 46* 45* 47* 40* 42*  < > = values in this interval not displayed. Basic Metabolic Panel:  Recent Labs Lab 08/30/16 0302  08/31/16 0907 09/01/16 0535 09/02/16 0507 09/03/16 0552 09/04/16 0602  NA 140  < > 139 141 143 146* 150*  K 4.2  < > 3.0* 2.9* 3.1* 3.5 5.3*  CL 109  < > 98* 95* 93* 105 117*  CO2 17*  < > 28 32 34* 29 24  GLUCOSE 49*  < > 193* 141* 202* 178* 138*  BUN 59*  < > 48* 38* 32* 28* 25*  CREATININE 2.88*  < > 2.65* 2.65* 2.35* 2.07* 2.40*  CALCIUM 7.9*  < > 7.6* 7.4* 7.1* 7.1* 8.0*  MG 1.3*  --   --   --   --   --   --   < > = values in this interval not displayed. GFR: Estimated Creatinine Clearance: 13.3 mL/min (by C-G formula based on SCr of 2.4 mg/dL (H)). Liver Function Tests:  Recent Labs Lab 08/29/16 2313  AST 382*  ALT 29  ALKPHOS 77    BILITOT <0.1*  PROT 6.5  ALBUMIN 3.3*    Recent Labs Lab 08/29/16 2313  LIPASE 95*   No results for input(s): AMMONIA in the last 168 hours. Coagulation Profile:  Recent Labs Lab 08/31/16 0907 09/01/16 0535 09/02/16 0507 09/03/16 0552 09/04/16 0602  INR 1.62 1.78 1.88 2.11 2.18   Cardiac Enzymes:  Recent Labs Lab 09/02/16 1426  TROPONINI 0.04*   BNP (last 3 results) No results for input(s): PROBNP in the last 8760 hours. HbA1C: No results for input(s): HGBA1C in the last 72 hours. CBG:  Recent Labs Lab 09/03/16 1705 09/03/16 2052 09/04/16 0109 09/04/16 0525 09/04/16 0721  GLUCAP 147* 122* 107* 123* 114*   Lipid Profile: No results for input(s): CHOL, HDL, LDLCALC, TRIG, CHOLHDL, LDLDIRECT in the last 72 hours. Thyroid Function Tests: No results for input(s): TSH, T4TOTAL, FREET4, T3FREE, THYROIDAB in the last 72 hours. Anemia Panel: No results for input(s): VITAMINB12, FOLATE, FERRITIN, TIBC, IRON, RETICCTPCT in the last 72 hours. Urine analysis:    Component Value Date/Time   COLORURINE STRAW (A) 08/29/2016 2335   APPEARANCEUR CLEAR 08/29/2016 2335   LABSPEC 1.011 08/29/2016 2335   LABSPEC 1.020 10/19/2013 1546   PHURINE 6.0 08/29/2016 2335   GLUCOSEU 50 (A) 08/29/2016 2335   GLUCOSEU 1,000 10/19/2013 1546   HGBUR SMALL (A) 08/29/2016 2335   BILIRUBINUR NEGATIVE 08/29/2016 2335   BILIRUBINUR negative 05/18/2015 1545   BILIRUBINUR Negative 10/19/2013 [place] 08/29/2016 2335   PROTEINUR 30 (A) 08/29/2016 2335   UROBILINOGEN negative 05/18/2015 1545   UROBILINOGEN 0.2 03/10/2014 0007   UROBILINOGEN 0.2 10/19/2013 1546   NITRITE NEGATIVE 08/29/2016 2335   LEUKOCYTESUR NEGATIVE 08/29/2016 2335   LEUKOCYTESUR Negative 10/19/2013 1546   Sepsis Labs: '@LABRCNTIP'$ (procalcitonin:4,lacticidven:4)  ) Recent Results (from the past 240 hour(s))  Blood Culture (routine x 2)     Status: None (Preliminary result)   Collection Time:  08/29/16 11:13 PM  Result Value Ref Range Status   Specimen Description BLOOD LEFT HAND  Final   Special Requests IN PEDIATRIC BOTTLE 1ML  Final   Culture   Final    NO GROWTH 4 DAYS Performed at [facility]    Report Status PENDING  Incomplete  Blood Culture (routine x 2)     Status:  None (Preliminary result)   Collection Time: 08/29/16 11:16 PM  Result Value Ref Range Status   Specimen Description BLOOD LEFT ANTECUBITAL  Final   Special Requests IN PEDIATRIC BOTTLE 1ML  Final   Culture   Final    NO GROWTH 4 DAYS Performed at [facility]    Report Status PENDING  Incomplete  Urine culture     Status: Abnormal   Collection Time: 08/29/16 11:35 PM  Result Value Ref Range Status   Specimen Description URINE, CLEAN CATCH  Final   Special Requests NONE  Final   Culture MULTIPLE SPECIES PRESENT, SUGGEST RECOLLECTION (A)  Final   Report Status 08/31/2016 FINAL  Final  MRSA PCR Screening     Status: None   Collection Time: 08/30/16  4:08 AM  Result Value Ref Range Status   MRSA by PCR NEGATIVE NEGATIVE Final    Comment:        The GeneXpert MRSA Assay (FDA approved for NASAL specimens only), is one component of a comprehensive MRSA colonization surveillance program. It is not intended to diagnose MRSA infection nor to guide or monitor treatment for MRSA infections.   C difficile quick scan w PCR reflex     Status: None   Collection Time: 09/01/16  3:56 PM  Result Value Ref Range Status   C Diff antigen NEGATIVE NEGATIVE Final   C Diff toxin NEGATIVE NEGATIVE Final   C Diff interpretation No C. difficile detected.  Final      Studies: No results found.  Scheduled Meds: . dexamethasone  20 mg Oral Q Mon  . feeding supplement  1 Container Oral TID BM  . [START ON 09/05/2016] pantoprazole (PROTONIX) IV  40 mg Intravenous Q24H  . piperacillin-tazobactam (ZOSYN)  IV  2.25 g Intravenous Q8H  . sennosides  5 mL Oral BID  . vancomycin  500 mg Intravenous Q48H   . Warfarin - Pharmacist Dosing Inpatient   Does not apply q1800    Continuous Infusions: . 0.9 % NaCl with KCl 40 mEq / L 75 mL/hr (09/04/16 0829)     LOS: 5 days     Stepahnie Campo, MD PhD [facility] Pager [phone]  If 7PM-7AM, please contact night-coverage [email or web address] Password [email or web address] 09/04/2016, 11:28 AM

## 2016-09-04 NOTE — Progress Notes (Signed)
[place] for Warfarin Indication: Hx DVT  No Known Allergies  Patient Measurements: Height: '5\' 1"'$  (154.9 cm) Weight: 96 lb 5.5 oz (43.7 kg) IBW/kg (Calculated) : 47.8  Vital Signs: Temp: 98.1 F (36.7 C) (01/04 0516) Temp Source: Oral (01/04 0516) BP: 178/87 (01/04 0516) Pulse Rate: 77 (01/04 0516)  Labs:  Recent Labs  09/02/16 0507 09/02/16 1426 09/03/16 0552 09/04/16 0602  HGB 10.4*  --  10.0* 10.3*  HCT 31.1*  --  30.0* 33.0*  PLT 47*  --  40* 42*  LABPROT 21.9*  --  23.9* 24.6*  INR 1.88  --  2.11 2.18  CREATININE 2.35*  --  2.07* 2.40*  TROPONINI  --  0.04*  --   --    Estimated Creatinine Clearance: 13.3 mL/min (by C-G formula based on SCr of 2.4 mg/dL (H)).  Assessment: 81 yr female on warfarin for h/o bilateral lower DVT's; PMH multiple myeloma. Admitted 12/29 for sepsis, pharmacy to resume warfarin while admitted.   PTA warfarin regimen = 2.'5mg'$  daily except '5mg'$  on MWF  Last dose PTA on 12/26 when INR = 4.3 and pt instructed by PCP to hold warfarin  Goal of Therapy:  INR 2-3  Today:  INR 2.18 therapeutic, despite last dose Warfarin 12/31  Plt 42 today,   Hgb/Plt both dropped acutely since admission; Myelosuppressive chemo received 12/26 but patient received this regimen for > 12 months starting in 2016 and never had Plt < 100k. Likely combination of chemo and acute illness/sepsis  Scr continues to increase  Potential drug interactions with warfarin:  Levofloxacin (can increase INR response) 12/31 > 1/1  Dexamethasone (can increase INR response)  Regular diet ordered, decreased intake since 1/1  Plan:   Discussed low platelets with [facility] MD 1/4  Continue to hold Warfarin  Daily INR, f/u CBC closely  Dolly Rias RPh 09/04/2016, 11:30 AM Pager [phone]

## 2016-09-05 LAB — GLUCOSE, CAPILLARY: GLUCOSE-CAPILLARY: 142 mg/dL — AB (ref 65–99)

## 2016-09-05 MED ORDER — HYDROMORPHONE HCL 1 MG/ML IJ SOLN
0.5000 mg | INTRAMUSCULAR | Status: DC | PRN
  Administered 2016-09-05 (×2): 0.5 mg via INTRAVENOUS
  Filled 2016-09-05 (×2): qty 0.5

## 2016-09-05 MED ORDER — MORPHINE SULFATE 20 MG/5ML PO SOLN: 2.5000 mg | ORAL | 0 refills | Status: AC | PRN

## 2016-09-05 MED ORDER — ONDANSETRON HCL 4 MG/5ML PO SOLN: 4.0000 mg | Freq: Three times a day (TID) | ORAL | 0 refills | Status: AC | PRN

## 2016-09-05 MED ORDER — SENNOSIDES 8.8 MG/5ML PO SYRP: 5.0000 mL | ORAL_SOLUTION | Freq: Two times a day (BID) | ORAL | 0 refills | Status: AC

## 2016-09-05 MED ORDER — LORAZEPAM 2 MG/ML PO CONC: 1.0000 mg | ORAL | 0 refills | Status: AC | PRN

## 2016-09-05 NOTE — Progress Notes (Signed)
Patient is set to discharge to [facility] today. Discharge summary sent to [facility] ([facility]). Patient & family at bedside aware. Discharge packet given to RN, Marolyn Hammock. [facility] called for transport.     Raynaldo Opitz, [facility] Clinical Social Worker cell #: [phone]

## 2016-09-05 NOTE — Discharge Summary (Signed)
Discharge Summary  [name] JJK:[id number] DOB: [birth year]  PCP: [name], MD  Admit date: 08/29/2016 Discharge date: 09/05/2016  Time spent: >60mns  Patient is to discharge to residential hospice on full comfort measures  Discharge Diagnoses:  Active Hospital Problems   Diagnosis Date Noted  . Hypoglycemia secondary to sulfonylurea 08/30/2016  . Goals of care, counseling/discussion   . Palliative care by specialist   . Protein-calorie malnutrition, severe 09/03/2016  . Senile dementia with delirium with behavioral disturbance 09/03/2016  . AKI (acute kidney injury) ([facility] 09/01/2016  . Metabolic acidosis, normal anion gap (NAG) 08/30/2016  . Cellulitis of right forearm 08/30/2016  . Myeloma kidney ([facility] 08/30/2016  . Hypomagnesemia 08/30/2016  . Acute encephalopathy   . CKD (chronic kidney disease) stage 3, GFR 30-59 ml/min   . Chest mass   . Chronic anticoagulation   . Hypokalemia 11/28/2014  . Diabetes mellitus without complication ([facility] [id number] . Multiple myeloma in relapse ([facility] 03/29/2010  . Essential hypertension 05/19/2008    Resolved Hospital Problems   Diagnosis Date Noted Date Resolved  No resolved problems to display.    Discharge Condition: stable  Diet recommendation: regular  Filed Weights   08/30/16 0800  Weight: 43.7 kg (96 lb 5.5 oz)    History of present illness:  HPI: Lindsay CRENSHAWis a 81y.o. 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  Patient presents to the ED with AMS.  Patient family reports that patient was found unresponsive when they checked on her 1 hour after she went to bed.  Patient was diaphoretic, cold, and EMS was called.  CBG was 32 on EMS arrival.  After half AMP of D50, D10, CBG trended up to 82 and now back down to 45 so she is getting more D50.  Her mental status has slowly but surely improved since arrival in the ED.  ED Course: Work up n the ED is also  significant for a pulmonary mass on CXR that appears to represent a plasmacytoma on CT scan.  Initial hypothermia with temperature of 96.4, this has improved with warm blankets and treatment of CBG.  WBC 3.0, Platelets of 66, HGB of 15   She was also noted on admission to have left upper extremity swelling, induration, tenderness and mild erythema in her forearm. This was thought to possibly be cellulitis versus venous thrombosis and patient started on antibiotics. She is also found to be in acute kidney injury with a creatinine of 3.7 (baseline looked to be around 1.4, but as of 12/5, baseline increased to around 2.3)  Her INR was noted to be subtherapeutic. It had been elevated prior to admission on 12/26 at 4.3 and she was likely told to hold this medication for several days. Also in the emergency room, chest x-ray noted possible mass but on CT confirmed the possible pneumonia. She's also noted to have a plasmacytoma lesion infiltrating rib.  Upper extremity Doppler ruled out DVT. Hypoglycemia resolved and patient able to be transferred to medical floor on 12/30.  Patient initially improved on 12/31, renal function improving and patient more alert with decreased right arm swelling. However by 1/1, more confused with drop in platelets and elevated pro calcitonin level. Antibiotics changed to broad spectrum. Coumadin on hold because of platelets.  Since 1/1, patient continues to be confused. Renal function at this point back to baseline. CT scan of head negative. In discussion with her family, she is normally on Xanax  0.25 mg with a dose in the morning, dyspnea afternoon and 0.5 mg at night and this is given scheduled. This is different than what has been appearing on her MAR. This morning, patient is quite agitated and very tearful and anxious. She is spitting out medications and refuses to take them. Her daughter also confirmed that when she is in the hospital but, she becomes quite confused usually  after day 2.   family has met with palliative care, they decided to start full comfort measures and discharge to residential hospice.    Hospital Course:  Principal Problem:   Hypoglycemia secondary to sulfonylurea Active Problems:   Multiple myeloma in relapse (HCC)   Diabetes mellitus without complication (HCC)   Essential hypertension   Hypokalemia   Chronic anticoagulation   Metabolic acidosis, normal anion gap (NAG)   Cellulitis of right forearm   Myeloma kidney (HCC)   Hypomagnesemia   Acute encephalopathy   CKD (chronic kidney disease) stage 3, GFR 30-59 ml/min   Chest mass   AKI (acute kidney injury) ([place])   Protein-calorie malnutrition, severe   Senile dementia with delirium with behavioral disturbance   Goals of care, counseling/discussion   Palliative care by specialist    Hypoglycemia causing acute metabolic encephalopathy secondary to sulfonylurea: Secondary to decreased clearance brought on by worsening renal failure. Now on full comfort measures  Acute encephalopathy in the setting of senile dementia with behavioral disturbance and anxiety state: Initially resolved, caused by hypoglycemia.  This recurrence is now likely from a combination of sundowning from dementia with also anxiety brought on by not getting her Xanax consistently and possibly even mild benzo withdrawal. Since she is refusing take medications, have started on some IV Ativan with a dose given this morning which has calmed her down and a dose this afternoon and a dose tonight.  Now on full comfort measures    Multiple myeloma ([place]): Oncology notified of admission.   patient's daughter stated that oncologist told her that she would not be a candidate for further chemotherapy. Now on full comfort measures    Essential hypertension: Continue home medications, Now on full comfort measures    Hypokalemia: Replaced. .   Chronic anticoagulation: INR subtherapeutic. Ruled out upper extremity  thrombus. Coumadin per pharmacy, however now on hold due to thrombocytopenia. Now on full comfort measures    Metabolic acidosis, normal anion gap (NAG)    Cellulitis of right forearm/swelling: Negative ultrasound. Initially had changed antibiotics to cover both cellulitis and pneumonia. Initially pro calcitonin on 12/30 noted improvement, but then started to trend upwards on 1/1. Antibiotics changed back to broad spectrum with improvement of pro-calcitonin. Now on full comfort measures    Acute kidney injury in the setting of Myeloma kidney (HCC)/chronic kidney disease, stage III: Continue hydration. Today at 2.07, near or at patient's baseline    Hypomagnesemia: Replacing.  Pathologic rib fracture/ mass:? Plasmacytoma. Oncology felt that she would not be a candidate for chemotherapy Now on full comfort measures  Community-acquired Pneumonia: Family reports an increased cough. She received abx in the hospital, .Now on full comfort measures  Severe protein calorie malnutrition: Patient meets criteria in the context of acute illness/injury. Seen by nutrition. On daily multivitamin plus boost breeze 3 times a day. Now on full comfort measures  Thrombocytopenia: Prior to admission at 175. Has been dropping since and still staying in the 40s. Discussed with pharmacy. Have stopped Coumadin for now and started SCDs. Felt to be secondary to infection and  chemotherapy  Lactic acidosis: On admission. Do not think this is sepsis, elevated level secondary to dehydration. Recheck at normal levels  Code Status: DNR    Family Communication: Updated daughter at the bedside  Disposition Plan: started on full comfort measures on 1/4, d/c to residential facility on 1/5   Consultants:  Oncology notified of admission   Palliative care  Procedures:  Right upper extremity ultrasound done 12/30: No evidence of DVT  Antimicrobials:  IV Zosyn and vancomycin 12/29-12/30, 1/1-present    Levaquin 12/30-1/1   DVT prophylaxis: Coumadin on hold. Currently on SCDs   Discharge Exam: BP (!) 145/60 (BP Location: Left Arm)   Pulse 100   Temp 98.5 F (36.9 C) (Oral)   Resp 14   Ht '5\' 1"'$  (1.549 m)   Wt 43.7 kg (96 lb 5.5 oz)   SpO2 100%   BMI 18.20 kg/m   General: frail, open eyes, confused Cardiovascular: RRR Respiratory: diminished at basis, no wheezing, no rhonchi, no rales Abdomen: Soft, nontender, nondistended, positive bowel sounds  Musculoskeletal: swelling, tenderness and erythematous of right forearm, slight improvement from previous day  Discharge Instructions You were cared for by a hospitalist during your hospital stay. If you have any questions about your discharge medications or the care you received while you were in the hospital after you are discharged, you can call the unit and asked to speak with the hospitalist on call if the hospitalist that took care of you is not available. Once you are discharged, your primary care physician will handle any further medical issues. Please note that NO REFILLS for any discharge medications will be authorized once you are discharged, as it is imperative that you return to your primary care physician (or establish a relationship with a primary care physician if you do not have one) for your aftercare needs so that they can reassess your need for medications and monitor your lab values.  Discharge Instructions    Diet general    Complete by:  As directed    Increase activity slowly    Complete by:  As directed      Allergies as of 09/05/2016   No Known Allergies     Medication List    STOP taking these medications   ALPRAZolam 0.5 MG tablet Commonly known as:  XANAX   amLODipine 5 MG tablet Commonly known as:  NORVASC   dexamethasone 4 MG tablet Commonly known as:  DECADRON   hydrochlorothiazide 12.5 MG capsule Commonly known as:  MICROZIDE   pantoprazole 40 MG tablet Commonly known as:  PROTONIX    warfarin 5 MG tablet Commonly known as:  COUMADIN     TAKE these medications   acetaminophen 500 MG tablet Commonly known as:  TYLENOL Take 500-1,000 mg by mouth every 6 (six) hours as needed for mild pain or headache.   diclofenac sodium 1 % Gel Commonly known as:  VOLTAREN Apply 2 g topically 2 (two) times daily as needed (for shoulder pain).   LORazepam 2 MG/ML concentrated solution Commonly known as:  ATIVAN Take 0.5 mLs (1 mg total) by mouth every 4 (four) hours as needed for anxiety.   mirtazapine 15 MG tablet Commonly known as:  REMERON Take 7.5 mg by mouth at bedtime as needed (for sleep).   morphine 20 MG/5ML solution Take 0.6 mLs (2.4 mg total) by mouth every 2 (two) hours as needed for pain.   ondansetron 4 MG/5ML solution Commonly known as:  ZOFRAN Take 5 mLs (4  mg total) by mouth every 8 (eight) hours as needed for nausea or vomiting.   prochlorperazine 10 MG tablet Commonly known as:  COMPAZINE Take 1 tablet (10 mg total) by mouth every 6 (six) hours as needed for nausea or vomiting.   sennosides 8.8 MG/5ML syrup Commonly known as:  SENOKOT Take 5 mLs by mouth 2 (two) times daily.   traMADol 50 MG tablet Commonly known as:  ULTRAM Take 25-50 mg by mouth every 6 (six) hours as needed for moderate pain.      No Known Allergies    The results of significant diagnostics from this hospitalization (including imaging, microbiology, ancillary and laboratory) are listed below for reference.    Significant Diagnostic Studies: Dg Forearm Right  Result Date: 08/29/2016 CLINICAL DATA:  Acute onset of right forearm erythema and swelling. Initial encounter. EXAM: RIGHT FOREARM - 2 VIEW COMPARISON:  Right elbow radiographs performed 07/20/2014 FINDINGS: There is no evidence of fracture or dislocation. The radius and ulna appear grossly intact. The elbow joint is grossly unremarkable. No elbow joint effusion is seen. Diffuse vascular calcifications are seen. The  carpal rows appear grossly intact, and demonstrate normal alignment. IMPRESSION: No evidence of fracture or dislocation. Diffuse vascular calcifications seen. Electronically Signed   By: Garald Balding M.D.   On: 08/29/2016 23:28   Ct Head Wo Contrast  Result Date: 09/02/2016 CLINICAL DATA:  81 year old female with a history of multiple myeloma. Confusion EXAM: CT HEAD WITHOUT CONTRAST TECHNIQUE: Contiguous axial images were obtained from the base of the skull through the vertex without intravenous contrast. COMPARISON:  07/30/2016, 09/10/2015, 09/08/2013, 05/03/2011 FINDINGS: Brain: No acute intracranial hemorrhage. No midline shift or mass effect. Similar appearance of the ventricular system. Diffuse brain volume loss is unchanged. Intracranial atherosclerotic calcifications. Vascular: Calcifications of the intracranial vasculature Skull: Multiple small lytic lesions throughout the calvarium, which do not appear significantly changed compared to the most recent CT studies. Overall the appearance is relatively similar dating to the CT 05/03/2011. No displaced fracture. Motion artifact limits evaluation of the skullbase. Sinuses/Orbits: No acute finding. Other: None. IMPRESSION: No CT evidence of acute intracranial abnormality. Similar appearance of brain volume loss. Intracranial atherosclerosis. Signed, Dulcy Fanny. Earleen Newport, DO [facility] Electronically Signed   By: Corrie Mckusick D.O.   On: 09/02/2016 12:40   Ct Chest Wo Contrast  Result Date: 08/30/2016 CLINICAL DATA:  Follow-up RIGHT lung mass. History of multiple myeloma on chemotherapy. Unresponsive. EXAM: CT CHEST WITHOUT CONTRAST TECHNIQUE: Multidetector CT imaging of the chest was performed following the standard protocol without IV contrast. Creatinine 3.47. COMPARISON:  Chest radiograph August 29, 2016 and priors. FINDINGS: Moderate respiratory motion degraded examination. Cardiovascular:  Heart size is mildly enlarged. Moderate coronary artery calcifications. Main pulmonary artery is enlarged, 3.4 cm compatible with chronic pulmonary hypertension. Thoracic aorta is normal in course and caliber with moderate calcific atherosclerosis. Mediastinum/Nodes: No medium spinal mass or lymphadenopathy though evaluation limited by lack of contrast. Lungs/Pleura: Patchy ground-glass consolidation RIGHT lower lobe. Trace pleural effusions. Interlobular septal thickening. Upper Abdomen: Peripherally calcified 16 mm probable mass RIGHT lobe of the liver, assessment limited due to patient motion. Peripancreatic fat stranding and small effusion about the tail of the pancreas. Musculoskeletal: Severe degenerative change of the RIGHT shoulder. Severe osteopenia. 5.3 x 2.3 cm expansile mass within the RIGHT lateral sixth rib with peripheral calcifications and pathologic fracture. Healing bilateral anterior rib fractures. Healing sternal fracture. Lytic lesions in the thoracic spine. Moderate to severe  T5, mild T6, severe T7, moderate T9, mild T11, mild T12 and moderate L1 fractures. Retropulsed bony fragments T7 resulting in mild canal stenosis. IMPRESSION: Moderate respiratory motion degraded examination. 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 Multiple healing anterior rib fractures, healing sternal fracture. Myelomatous involvement of the thoracic spine with multiple pathologic fractures. Patchy RIGHT lower lobe possible pneumonia. Interlobular septal thickening most compatible with pulmonary edema. Included pancreas demonstrates apparent acute of pancreatitis, recommend correlation with amylase and lipase. Electronically Signed   By: Elon Alas M.D.   On: 08/30/2016 02:41   Dg Chest Port 1 View  Result Date: 08/29/2016 CLINICAL DATA:  Acute onset of hypoglycemia. Altered mental status. Patient found unresponsive.  Initial encounter. EXAM: PORTABLE CHEST 1 VIEW COMPARISON:  Chest radiograph performed 11/01/2015 FINDINGS: The lungs are hypoexpanded. A 5.3 cm peripheral masslike density is noted at the right lung. This may reflect focal pneumonia or possibly a mass. There is no evidence of pleural effusion or pneumothorax. The cardiomediastinal silhouette is borderline normal in size. No acute osseous abnormalities are seen. IMPRESSION: Lungs hypoexpanded. 5.3 cm peripheral masslike density at the right lung. This may reflect focal pneumonia or possibly mass. CT of the chest would be helpful for further evaluation, when and as deemed clinically appropriate. Electronically Signed   By: Garald Balding M.D.   On: 08/29/2016 23:33   Dg Hand Complete Right  Result Date: 08/29/2016 CLINICAL DATA:  Acute onset of altered mental status. Right hand swelling and erythema. Initial encounter. EXAM: RIGHT HAND - COMPLETE 3+ VIEW COMPARISON:  None. FINDINGS: There is mild deformity about the distal aspect of the fourth proximal phalanx, with joint space loss. This may be degenerative or posttraumatic in nature. Surrounding soft tissue swelling is noted. No acute fracture or dislocation is seen. The carpal rows appear grossly intact. Diffuse vascular calcifications are seen. IMPRESSION: 1. No evidence of fracture or dislocation. Mild deformity about the distal aspect of the fourth proximal phalanx, possibly degenerative or posttraumatic in nature. 2. Diffuse vascular calcifications seen. Electronically Signed   By: Garald Balding M.D.   On: 08/29/2016 23:27    Microbiology: Recent Results (from the past 240 hour(s))  Blood Culture (routine x 2)     Status: None   Collection Time: 08/29/16 11:13 PM  Result Value Ref Range Status   Specimen Description BLOOD LEFT HAND  Final   Special Requests IN PEDIATRIC BOTTLE 1ML  Final   Culture   Final    NO GROWTH 5 DAYS Performed at [facility]    Report Status 09/04/2016 FINAL   Final  Blood Culture (routine x 2)     Status: None   Collection Time: 08/29/16 11:16 PM  Result Value Ref Range Status   Specimen Description BLOOD LEFT ANTECUBITAL  Final   Special Requests IN PEDIATRIC BOTTLE 1ML  Final   Culture   Final    NO GROWTH 5 DAYS Performed at [facility]    Report Status 09/04/2016 FINAL  Final  Urine culture     Status: Abnormal   Collection Time: 08/29/16 11:35 PM  Result Value Ref Range Status   Specimen Description URINE, CLEAN CATCH  Final   Special Requests NONE  Final   Culture MULTIPLE SPECIES PRESENT, SUGGEST RECOLLECTION (A)  Final   Report Status 08/31/2016 FINAL  Final  MRSA PCR Screening     Status: None  Collection Time: 08/30/16  4:08 AM  Result Value Ref Range Status   MRSA by PCR NEGATIVE NEGATIVE Final    Comment:        The GeneXpert MRSA Assay (FDA approved for NASAL specimens only), is one component of a comprehensive MRSA colonization surveillance program. It is not intended to diagnose MRSA infection nor to guide or monitor treatment for MRSA infections.   C difficile quick scan w PCR reflex     Status: None   Collection Time: 09/01/16  3:56 PM  Result Value Ref Range Status   C Diff antigen NEGATIVE NEGATIVE Final   C Diff toxin NEGATIVE NEGATIVE Final   C Diff interpretation No C. difficile detected.  Final     Labs: Basic Metabolic Panel:  Recent Labs Lab 08/30/16 0302  08/31/16 6063 09/01/16 0535 09/02/16 0507 09/03/16 0552 09/04/16 0602  NA 140  < > 139 141 143 146* 150*  K 4.2  < > 3.0* 2.9* 3.1* 3.5 5.3*  CL 109  < > 98* 95* 93* 105 117*  CO2 17*  < > 28 32 34* 29 24  GLUCOSE 49*  < > 193* 141* 202* 178* 138*  BUN 59*  < > 48* 38* 32* 28* 25*  CREATININE 2.88*  < > 2.65* 2.65* 2.35* 2.07* 2.40*  CALCIUM 7.9*  < > 7.6* 7.4* 7.1* 7.1* 8.0*  MG 1.3*  --   --   --   --   --   --   < > = values in this interval not displayed. Liver Function Tests:  Recent Labs Lab 08/29/16 2313  AST  382*  ALT 29  ALKPHOS 77  BILITOT <0.1*  PROT 6.5  ALBUMIN 3.3*    Recent Labs Lab 08/29/16 2313  LIPASE 95*   No results for input(s): AMMONIA in the last 168 hours. CBC:  Recent Labs Lab 08/29/16 2313  08/31/16 0907 09/01/16 0535 09/02/16 0507 09/03/16 0552 09/04/16 0602  WBC 3.0*  < > 3.2* 2.4* 2.2* 4.3 3.6*  NEUTROABS 2.6  --   --   --   --   --   --   HGB 15.0  < > 10.9* 10.5* 10.4* 10.0* 10.3*  HCT 42.6  < > 31.6* 30.9* 31.1* 30.0* 33.0*  MCV 82.7  < > 84.0 85.8 87.1 86.2 88.7  PLT 66*  < > 46* 45* 47* 40* 42*  < > = values in this interval not displayed. Cardiac Enzymes:  Recent Labs Lab 09/02/16 1426  TROPONINI 0.04*   BNP: BNP (last 3 results)  Recent Labs  08/30/16 0315  BNP 193.7*    ProBNP (last 3 results) No results for input(s): PROBNP in the last 8760 hours.  CBG:  Recent Labs Lab 09/04/16 0109 09/04/16 0525 09/04/16 0721 09/04/16 1228 09/05/16 0802  GLUCAP 107* 123* 114* 138* 142*       Signed:  Trentan Trippe MD, PhD  [facility] 09/05/2016, 8:08 AM

## 2016-09-05 NOTE — Progress Notes (Signed)
Report called to nurse Colletta Maryland at [facility]. Patient alert and dressed, awaiting transport. Daughter at bedside. PRN Dilaudid given for pain at 0958. Patient resting comfortably at this time.

## 2016-09-08 ENCOUNTER — Ambulatory Visit: Payer: Medicare Other | Admitting: Internal Medicine

## 2016-09-08 ENCOUNTER — Other Ambulatory Visit: Payer: Medicare Other

## 2016-09-15 ENCOUNTER — Telehealth: Payer: Self-pay | Admitting: Vascular Surgery

## 2016-09-15 ENCOUNTER — Telehealth: Payer: Self-pay | Admitting: Internal Medicine

## 2016-09-15 ENCOUNTER — Other Ambulatory Visit: Payer: Medicare Other

## 2016-09-15 ENCOUNTER — Ambulatory Visit: Payer: Medicare Other

## 2016-09-15 ENCOUNTER — Telehealth: Payer: Self-pay | Admitting: Family Medicine

## 2016-09-15 DIAGNOSIS — R627 Adult failure to thrive: Secondary | ICD-10-CM | POA: Diagnosis not present

## 2016-09-15 DIAGNOSIS — Z4789 Encounter for other orthopedic aftercare: Secondary | ICD-10-CM | POA: Diagnosis not present

## 2016-09-15 NOTE — Telephone Encounter (Signed)
Patients daughter called and wanted to let us know her mom [name] passed away this past Nov 14, 2022.  She wanted to let everyone know how much she appreciated you and just to say thank you

## 2016-09-15 NOTE — Telephone Encounter (Signed)
daughter called - pt passed away last 2022-11-29 on Sep 27, 2022.  She says thank you so much for everything.  cb number for daughter is [phone] Thank you

## 2016-09-15 NOTE — Telephone Encounter (Signed)
Pt's daughter, [name], stated her mother passed away on 09/18/2016. She was the patient of Dr. Scot Dock and has an appointment on 02.07.2018.   Thank you

## 2016-09-16 ENCOUNTER — Other Ambulatory Visit (HOSPITAL_COMMUNITY): Payer: Medicare Other

## 2016-09-16 ENCOUNTER — Ambulatory Visit: Payer: Medicare Other

## 2016-09-16 NOTE — Telephone Encounter (Signed)
Called and LMOVM.  I'll try to get in touch with her later.  This pleasant and kind lady was always a pleasure to see in clinic.  She always asked about my kids and family, no matter how badly she felt.   She will be missed.

## 2016-09-19 NOTE — Telephone Encounter (Signed)
Called but couldn't LMOVM, VM was full.

## 2016-09-22 ENCOUNTER — Ambulatory Visit: Payer: Medicare Other

## 2016-09-22 ENCOUNTER — Other Ambulatory Visit: Payer: Medicare Other

## 2016-09-22 NOTE — Telephone Encounter (Signed)
Called again and LMOVM.  Condolences offered.

## 2016-09-23 ENCOUNTER — Ambulatory Visit: Payer: Medicare Other

## 2016-09-29 ENCOUNTER — Ambulatory Visit: Payer: Medicare Other

## 2016-09-29 ENCOUNTER — Other Ambulatory Visit: Payer: Medicare Other

## 2016-09-30 ENCOUNTER — Ambulatory Visit: Payer: Medicare Other

## 2016-10-02 DEATH — deceased

## 2016-10-08 ENCOUNTER — Ambulatory Visit: Payer: Medicare Other | Admitting: Vascular Surgery

## 2016-10-08 ENCOUNTER — Encounter (HOSPITAL_COMMUNITY): Payer: Medicare Other

## 2016-12-09 NOTE — Telephone Encounter (Signed)
ERROR

## 2016-12-12 ENCOUNTER — Other Ambulatory Visit: Payer: Self-pay | Admitting: Nurse Practitioner

## 2018-01-01 IMAGING — MR MR FOOT*L* W/O CM
4 of 6 series · 19 of 40 positions shown · non-contrast
Comparison: None.

CLINICAL DATA: . History of multiple myeloma. On chemo therapy
currently. Increased pain and redness.

EXAM:
MRI OF THE LEFT FOREFOOT WITHOUT CONTRAST
TECHNIQUE: Multiplanar, multisequence MR imaging was performed. No intravenous
contrast was administered.

[Series 3: T1 · coronal · 4.0mm · 0.29mm/px · 9 of 32 slices shown (1 of 2)]
[im 1/32]
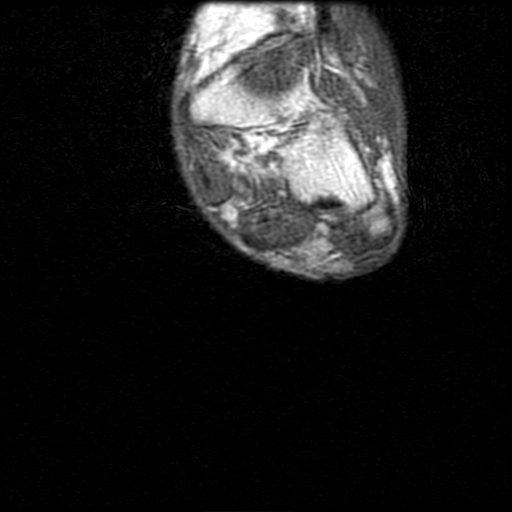
[im 4/32]
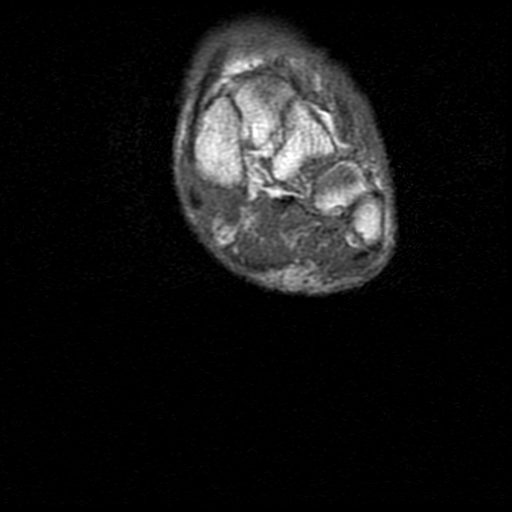
[im 8/32]
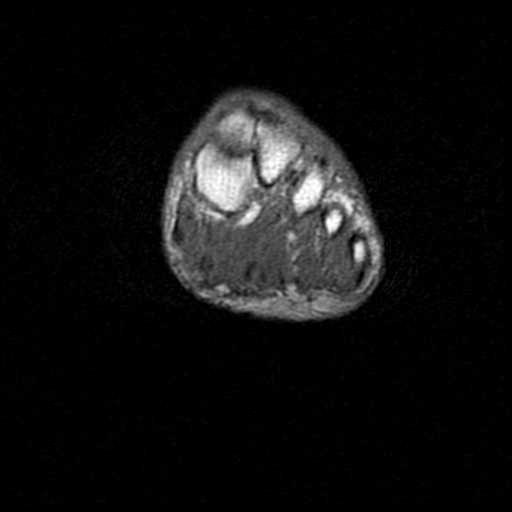
[im 12/32]
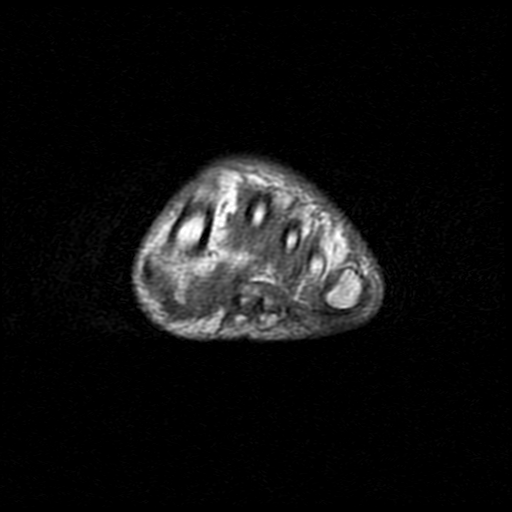
[im 16/32]
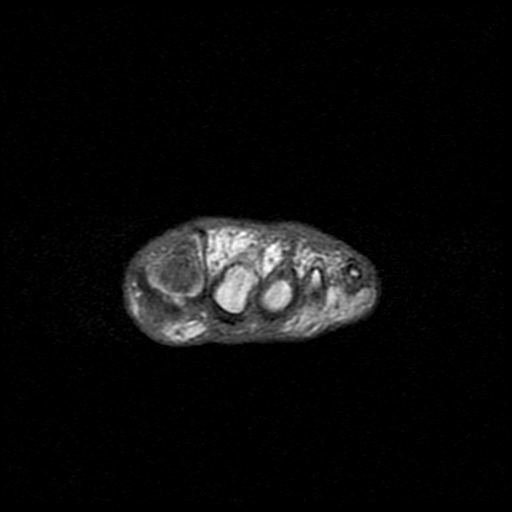
[im 20/32]
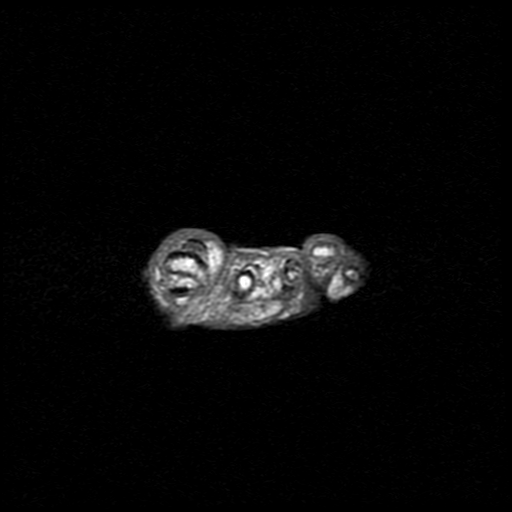
[im 24/32]
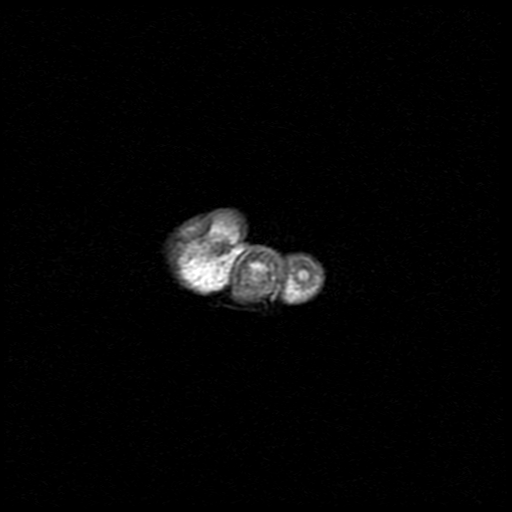
[im 28/32]
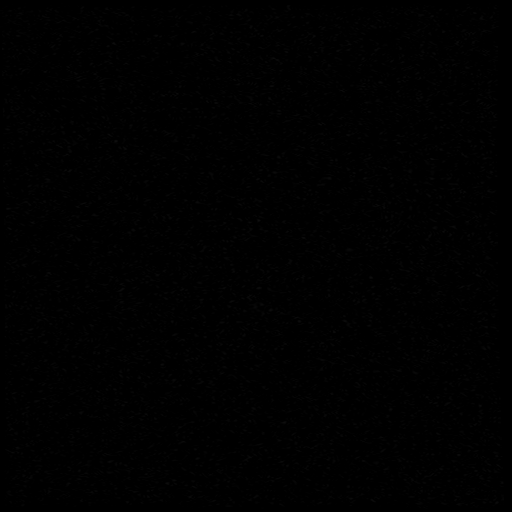
[im 32/32]
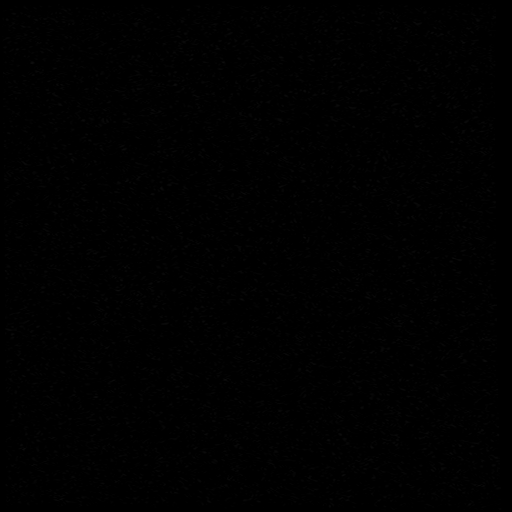

[Series 4: T2 · coronal · 4.0mm · 0.29mm/px · 3 of 32 slices shown]
[im 5/32]
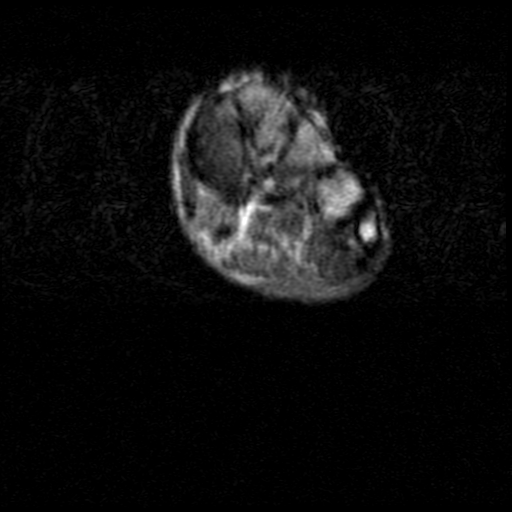
[im 18/32]
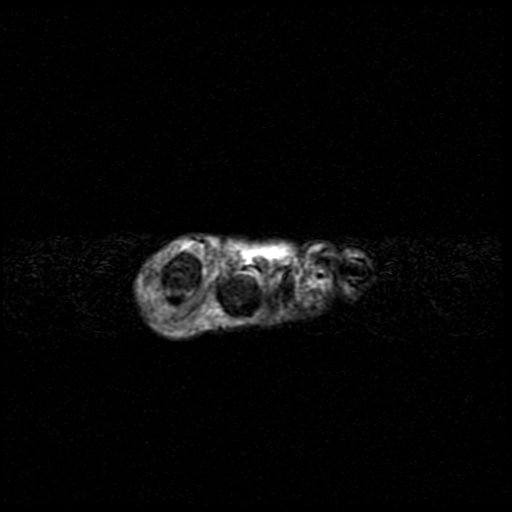
[im 27/32]
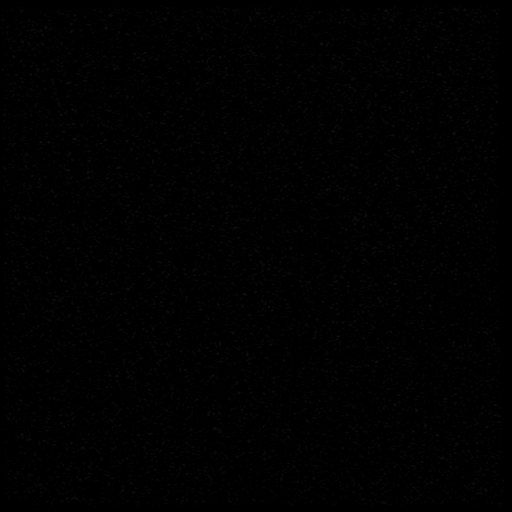

[Series 6: STIR · axial · 4.0mm · 0.29mm/px · z∈[-22,+67]mm · 3 of 20 slices shown]
[im 1/20]
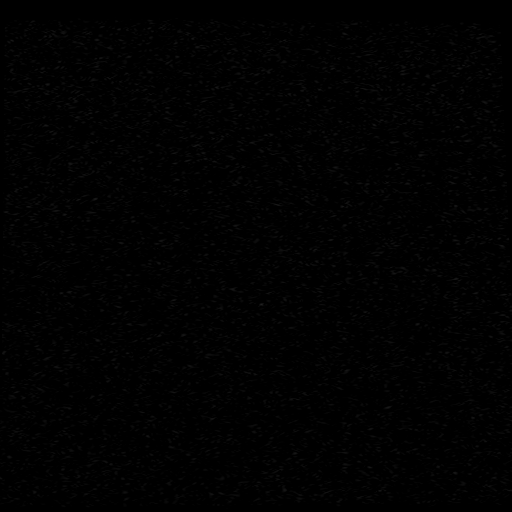
[im 10/20]
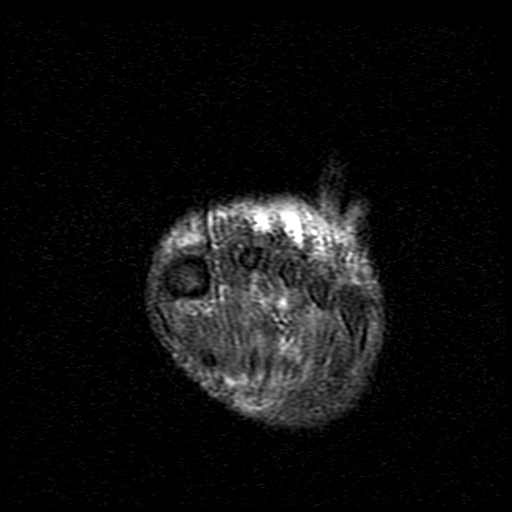
[im 20/20]
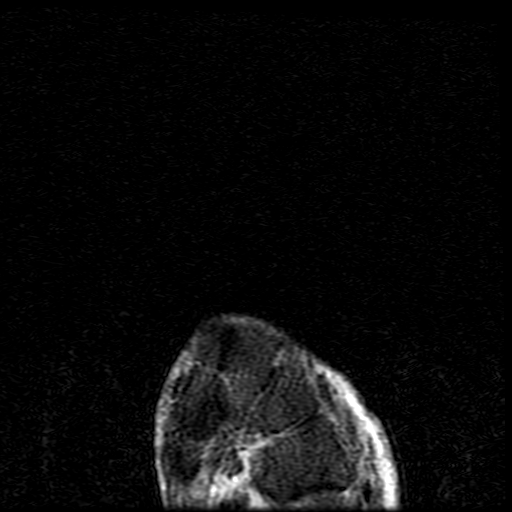

[Series 7: T1 · axial · 4.0mm · 0.29mm/px · z∈[-22,+67]mm · 4 of 20 slices shown (2 of 2)]
[im 1/20]
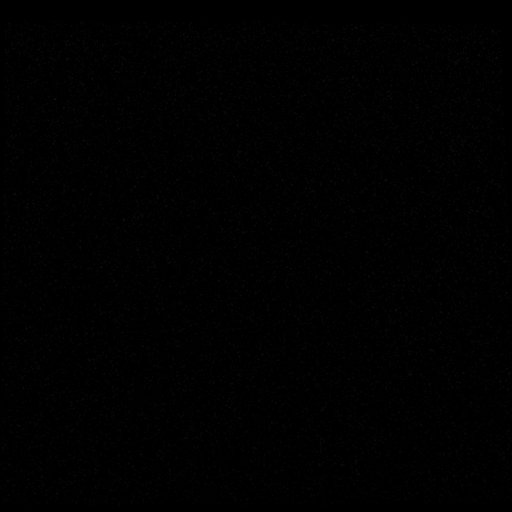
[im 5/20]
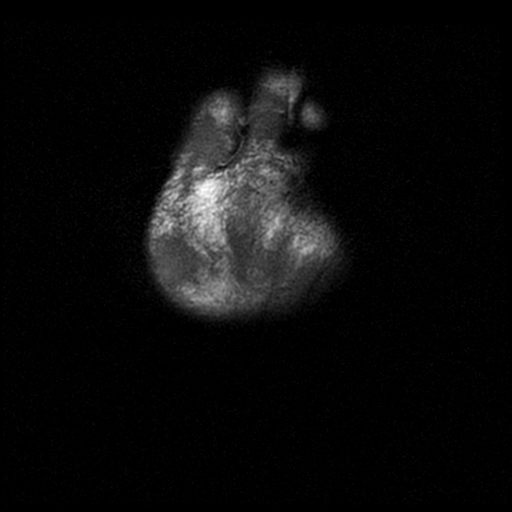
[im 10/20]
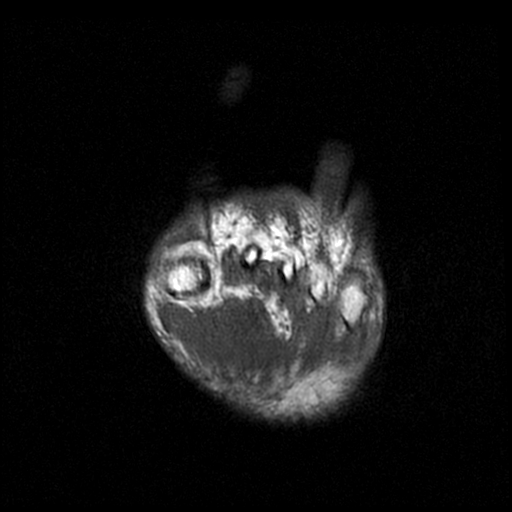
[im 20/20]
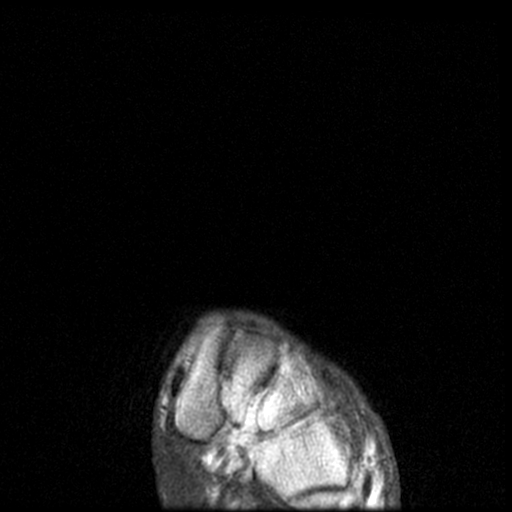

[19 of 40 positions shown; findings below may reference images not displayed]

FINDINGS: Patient motion degrades image quality limiting evaluation.

There is no acute fracture or dislocation. There is no joint
effusion. There is no bone destruction or periosteal reaction. There
is mild marrow edema in the second distal phalanx with minimal T1
hypointensity. There is minimal marrow edema in the third distal
phalanx without T1 hypointensity. There is no other marrow signal
abnormality.

The soft tissues are normal. There is no drainable fluid collection
or hematoma. The flexor, extensor and peroneal tendons are intact.
The muscles are normal.
IMPRESSION: 1. Minimal marrow edema in the second and third distal phalanx
without definite cortical destruction. [REDACTED] reflect reactive
changes secondary to mild ischemia. No definite evidence of
osteomyelitis of this time.
